# Patient Record
Sex: Male | Born: 1945 | Race: White | Hispanic: No | Marital: Married | Smoking: Former smoker
Health system: Southern US, Community
[De-identification: ages and names within clinical notes are randomized; demographics above are authoritative.]

## PROBLEM LIST (undated history)

## (undated) DIAGNOSIS — K8020 Calculus of gallbladder without cholecystitis without obstruction: Secondary | ICD-10-CM

## (undated) DIAGNOSIS — E875 Hyperkalemia: Secondary | ICD-10-CM

## (undated) DIAGNOSIS — G4733 Obstructive sleep apnea (adult) (pediatric): Secondary | ICD-10-CM

## (undated) DIAGNOSIS — E119 Type 2 diabetes mellitus without complications: Secondary | ICD-10-CM

## (undated) DIAGNOSIS — D61810 Antineoplastic chemotherapy induced pancytopenia: Secondary | ICD-10-CM

## (undated) DIAGNOSIS — I5032 Chronic diastolic (congestive) heart failure: Secondary | ICD-10-CM

## (undated) DIAGNOSIS — E785 Hyperlipidemia, unspecified: Secondary | ICD-10-CM

## (undated) DIAGNOSIS — L309 Dermatitis, unspecified: Secondary | ICD-10-CM

## (undated) DIAGNOSIS — L299 Pruritus, unspecified: Secondary | ICD-10-CM

## (undated) DIAGNOSIS — Z9981 Dependence on supplemental oxygen: Secondary | ICD-10-CM

## (undated) DIAGNOSIS — E662 Morbid (severe) obesity with alveolar hypoventilation: Secondary | ICD-10-CM

## (undated) DIAGNOSIS — J90 Pleural effusion, not elsewhere classified: Secondary | ICD-10-CM

## (undated) DIAGNOSIS — H5340 Unspecified visual field defects: Secondary | ICD-10-CM

## (undated) DIAGNOSIS — S68117A Complete traumatic metacarpophalangeal amputation of left little finger, initial encounter: Secondary | ICD-10-CM

## (undated) DIAGNOSIS — K922 Gastrointestinal hemorrhage, unspecified: Secondary | ICD-10-CM

## (undated) DIAGNOSIS — B029 Zoster without complications: Secondary | ICD-10-CM

## (undated) DIAGNOSIS — N183 Chronic kidney disease, stage 3 (moderate): Secondary | ICD-10-CM

## (undated) DIAGNOSIS — R0902 Hypoxemia: Secondary | ICD-10-CM

## (undated) DIAGNOSIS — I2510 Atherosclerotic heart disease of native coronary artery without angina pectoris: Secondary | ICD-10-CM

## (undated) DIAGNOSIS — I219 Acute myocardial infarction, unspecified: Secondary | ICD-10-CM

## (undated) DIAGNOSIS — M1990 Unspecified osteoarthritis, unspecified site: Secondary | ICD-10-CM

## (undated) DIAGNOSIS — Z87442 Personal history of urinary calculi: Secondary | ICD-10-CM

## (undated) DIAGNOSIS — J449 Chronic obstructive pulmonary disease, unspecified: Secondary | ICD-10-CM

## (undated) DIAGNOSIS — I10 Essential (primary) hypertension: Secondary | ICD-10-CM

## (undated) DIAGNOSIS — I5042 Chronic combined systolic (congestive) and diastolic (congestive) heart failure: Secondary | ICD-10-CM

## (undated) DIAGNOSIS — I4510 Unspecified right bundle-branch block: Secondary | ICD-10-CM

## (undated) DIAGNOSIS — H9113 Presbycusis, bilateral: Secondary | ICD-10-CM

## (undated) DIAGNOSIS — K7460 Unspecified cirrhosis of liver: Secondary | ICD-10-CM

## (undated) DIAGNOSIS — C159 Malignant neoplasm of esophagus, unspecified: Secondary | ICD-10-CM

## (undated) HISTORY — DX: Atherosclerotic heart disease of native coronary artery without angina pectoris: I25.10

## (undated) HISTORY — DX: Type 2 diabetes mellitus without complications: E11.9

## (undated) HISTORY — DX: Unspecified visual field defects: H53.40

## (undated) HISTORY — PX: APPENDECTOMY: SHX54

## (undated) HISTORY — DX: Pleural effusion, not elsewhere classified: J90

## (undated) HISTORY — PX: CARPAL TUNNEL RELEASE: SHX101

## (undated) HISTORY — DX: Antineoplastic chemotherapy induced pancytopenia: D61.810

## (undated) HISTORY — DX: Pruritus, unspecified: L29.9

## (undated) HISTORY — DX: Morbid (severe) obesity with alveolar hypoventilation: E66.2

## (undated) HISTORY — DX: Hyperkalemia: E87.5

## (undated) HISTORY — PX: SHOULDER SURGERY: SHX246

## (undated) HISTORY — DX: Obstructive sleep apnea (adult) (pediatric): G47.33

## (undated) HISTORY — DX: Essential (primary) hypertension: I10

## (undated) HISTORY — DX: Unspecified cirrhosis of liver: K74.60

## (undated) HISTORY — PX: TRANSTHORACIC ECHOCARDIOGRAM: SHX275

## (undated) HISTORY — PX: CARDIAC CATHETERIZATION: SHX172

## (undated) HISTORY — DX: Presbycusis, bilateral: H91.13

## (undated) HISTORY — DX: Unspecified osteoarthritis, unspecified site: M19.90

## (undated) HISTORY — DX: Zoster without complications: B02.9

## (undated) HISTORY — DX: Hypoxemia: R09.02

## (undated) HISTORY — DX: Unspecified right bundle-branch block: I45.10

## (undated) HISTORY — DX: Morbid (severe) obesity due to excess calories: E66.01

## (undated) HISTORY — PX: TONSILLECTOMY: SUR1361

## (undated) HISTORY — PX: WRIST SURGERY: SHX841

## (undated) HISTORY — DX: Chronic kidney disease, stage 3 (moderate): N18.3

## (undated) HISTORY — DX: Hyperlipidemia, unspecified: E78.5

## (undated) HISTORY — DX: Chronic diastolic (congestive) heart failure: I50.32

## (undated) HISTORY — DX: Calculus of gallbladder without cholecystitis without obstruction: K80.20

## (undated) HISTORY — PX: CATARACT EXTRACTION, BILATERAL: SHX1313

## (undated) HISTORY — DX: Gastrointestinal hemorrhage, unspecified: K92.2

## (undated) HISTORY — DX: Chronic obstructive pulmonary disease, unspecified: J44.9

## (undated) HISTORY — DX: Dermatitis, unspecified: L30.9

## (undated) SURGERY — LEFT HEART CATHETERIZATION WITH CORONARY ANGIOGRAM
Anesthesia: Moderate Sedation

---
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
## 1998-03-08 ENCOUNTER — Encounter: Admission: RE | Admit: 1998-03-08 | Discharge: 1998-06-06 | Payer: Self-pay | Admitting: Family Medicine

## 1999-10-30 HISTORY — PX: LUMBAR DISC SURGERY: SHX700

## 1999-10-30 LAB — HM COLONOSCOPY: HM Colonoscopy: NORMAL

## 2000-01-15 ENCOUNTER — Encounter: Admission: RE | Admit: 2000-01-15 | Discharge: 2000-01-24 | Payer: Self-pay | Admitting: Occupational Medicine

## 2000-03-27 ENCOUNTER — Ambulatory Visit (HOSPITAL_BASED_OUTPATIENT_CLINIC_OR_DEPARTMENT_OTHER): Admission: RE | Admit: 2000-03-27 | Discharge: 2000-03-27 | Payer: Self-pay | Admitting: *Deleted

## 2000-06-21 ENCOUNTER — Encounter: Payer: Self-pay | Admitting: Neurosurgery

## 2000-06-26 ENCOUNTER — Encounter: Payer: Self-pay | Admitting: Neurosurgery

## 2000-06-26 ENCOUNTER — Ambulatory Visit (HOSPITAL_COMMUNITY): Admission: RE | Admit: 2000-06-26 | Discharge: 2000-06-26 | Payer: Self-pay | Admitting: Neurosurgery

## 2000-06-29 ENCOUNTER — Emergency Department (HOSPITAL_COMMUNITY): Admission: EM | Admit: 2000-06-29 | Discharge: 2000-06-29 | Payer: Self-pay | Admitting: Emergency Medicine

## 2003-02-17 ENCOUNTER — Encounter: Admission: RE | Admit: 2003-02-17 | Discharge: 2003-05-18 | Payer: Self-pay | Admitting: Family Medicine

## 2003-09-14 ENCOUNTER — Encounter: Payer: Self-pay | Admitting: Internal Medicine

## 2003-09-20 ENCOUNTER — Encounter: Payer: Self-pay | Admitting: Internal Medicine

## 2003-10-06 ENCOUNTER — Encounter: Admission: RE | Admit: 2003-10-06 | Discharge: 2003-10-06 | Payer: Self-pay | Admitting: Urology

## 2003-10-07 ENCOUNTER — Observation Stay (HOSPITAL_COMMUNITY): Admission: EM | Admit: 2003-10-07 | Discharge: 2003-10-08 | Payer: Self-pay | Admitting: Urology

## 2003-10-18 ENCOUNTER — Encounter: Payer: Self-pay | Admitting: Internal Medicine

## 2004-09-05 ENCOUNTER — Encounter: Admission: RE | Admit: 2004-09-05 | Discharge: 2004-12-04 | Payer: Self-pay | Admitting: Internal Medicine

## 2004-11-13 ENCOUNTER — Ambulatory Visit: Payer: Self-pay | Admitting: Internal Medicine

## 2004-12-12 ENCOUNTER — Encounter: Admission: RE | Admit: 2004-12-12 | Discharge: 2005-03-12 | Payer: Self-pay | Admitting: Internal Medicine

## 2005-02-12 ENCOUNTER — Ambulatory Visit: Payer: Self-pay | Admitting: Internal Medicine

## 2005-04-11 ENCOUNTER — Ambulatory Visit: Payer: Self-pay | Admitting: Internal Medicine

## 2005-04-18 ENCOUNTER — Ambulatory Visit: Payer: Self-pay | Admitting: Internal Medicine

## 2005-05-22 ENCOUNTER — Ambulatory Visit: Payer: Self-pay | Admitting: Internal Medicine

## 2005-09-05 ENCOUNTER — Ambulatory Visit: Payer: Self-pay | Admitting: Internal Medicine

## 2005-10-11 ENCOUNTER — Ambulatory Visit: Payer: Self-pay | Admitting: Internal Medicine

## 2005-10-16 ENCOUNTER — Ambulatory Visit: Payer: Self-pay | Admitting: Internal Medicine

## 2005-10-24 ENCOUNTER — Ambulatory Visit: Payer: Self-pay | Admitting: Internal Medicine

## 2005-11-20 ENCOUNTER — Ambulatory Visit: Payer: Self-pay | Admitting: Internal Medicine

## 2006-01-04 ENCOUNTER — Ambulatory Visit: Payer: Self-pay | Admitting: Internal Medicine

## 2006-01-11 ENCOUNTER — Ambulatory Visit: Payer: Self-pay | Admitting: Internal Medicine

## 2006-04-24 ENCOUNTER — Ambulatory Visit: Payer: Self-pay | Admitting: Internal Medicine

## 2006-05-14 ENCOUNTER — Ambulatory Visit: Payer: Self-pay | Admitting: Internal Medicine

## 2006-05-20 ENCOUNTER — Ambulatory Visit: Payer: Self-pay | Admitting: Family Medicine

## 2006-06-25 ENCOUNTER — Ambulatory Visit: Payer: Self-pay | Admitting: Internal Medicine

## 2006-08-02 ENCOUNTER — Ambulatory Visit: Payer: Self-pay | Admitting: Internal Medicine

## 2006-09-06 ENCOUNTER — Ambulatory Visit: Payer: Self-pay | Admitting: Internal Medicine

## 2006-09-06 LAB — CONVERTED CEMR LAB
ALT: 45 units/L — ABNORMAL HIGH (ref 0–40)
AST: 38 units/L — ABNORMAL HIGH (ref 0–37)
Albumin: 3.8 g/dL (ref 3.5–5.2)
Alkaline Phosphatase: 53 units/L (ref 39–117)
BUN: 16 mg/dL (ref 6–23)
Bilirubin, Direct: 0.1 mg/dL (ref 0.0–0.3)
CO2: 34 meq/L — ABNORMAL HIGH (ref 19–32)
Calcium: 9.8 mg/dL (ref 8.4–10.5)
Chloride: 101 meq/L (ref 96–112)
Chol/HDL Ratio, serum: 4.8
Cholesterol: 167 mg/dL (ref 0–200)
Creatinine, Ser: 0.9 mg/dL (ref 0.4–1.5)
GFR calc non Af Amer: 91 mL/min
Glomerular Filtration Rate, Af Am: 110 mL/min/{1.73_m2}
Glucose, Bld: 100 mg/dL — ABNORMAL HIGH (ref 70–99)
HDL: 34.9 mg/dL — ABNORMAL LOW (ref >39.0)
Hgb A1c MFr Bld: 6.3 % — ABNORMAL HIGH (ref 4.6–6.0)
LDL DIRECT: 96.1 mg/dL
Potassium: 5.2 meq/L — ABNORMAL HIGH (ref 3.5–5.1)
Sodium: 140 meq/L (ref 135–145)
Total Bilirubin: 0.9 mg/dL (ref 0.3–1.2)
Total Protein: 7.2 g/dL (ref 6.0–8.3)
Triglyceride fasting, serum: 268 mg/dL (ref 0–149)
VLDL: 54 mg/dL — ABNORMAL HIGH (ref 0–40)

## 2006-10-16 ENCOUNTER — Ambulatory Visit: Payer: Self-pay | Admitting: Internal Medicine

## 2007-02-07 ENCOUNTER — Ambulatory Visit: Payer: Self-pay | Admitting: Internal Medicine

## 2007-02-07 LAB — CONVERTED CEMR LAB
AST: 40 units/L — ABNORMAL HIGH (ref 0–37)
Albumin: 3.6 g/dL (ref 3.5–5.2)
Alkaline Phosphatase: 57 units/L (ref 39–117)
BUN: 16 mg/dL (ref 6–23)
CO2: 35 meq/L — ABNORMAL HIGH (ref 19–32)
Chloride: 104 meq/L (ref 96–112)
Cholesterol: 174 mg/dL (ref 0–200)
Creatinine, Ser: 0.9 mg/dL (ref 0.4–1.5)
HDL: 38.4 mg/dL — ABNORMAL LOW (ref >39.0)
Sodium: 147 meq/L — ABNORMAL HIGH (ref 135–145)
Total Bilirubin: 1 mg/dL (ref 0.3–1.2)
Total Protein: 7.1 g/dL (ref 6.0–8.3)
VLDL: 54 mg/dL — ABNORMAL HIGH (ref 0–40)

## 2007-02-14 ENCOUNTER — Ambulatory Visit: Payer: Self-pay | Admitting: Internal Medicine

## 2007-05-01 DIAGNOSIS — I10 Essential (primary) hypertension: Secondary | ICD-10-CM

## 2007-05-01 DIAGNOSIS — M1990 Unspecified osteoarthritis, unspecified site: Secondary | ICD-10-CM

## 2007-05-01 HISTORY — DX: Unspecified osteoarthritis, unspecified site: M19.90

## 2007-05-01 HISTORY — DX: Essential (primary) hypertension: I10

## 2007-06-11 ENCOUNTER — Telehealth (INDEPENDENT_AMBULATORY_CARE_PROVIDER_SITE_OTHER): Payer: Self-pay | Admitting: *Deleted

## 2007-06-17 ENCOUNTER — Ambulatory Visit: Payer: Self-pay | Admitting: Internal Medicine

## 2007-06-17 LAB — CONVERTED CEMR LAB
ALT: 42 units/L (ref 0–53)
AST: 33 units/L (ref 0–37)
Albumin: 3.9 g/dL (ref 3.5–5.2)
Alkaline Phosphatase: 54 units/L (ref 39–117)
BUN: 13 mg/dL (ref 6–23)
Bilirubin, Direct: 0.1 mg/dL (ref 0.0–0.3)
CO2: 35 meq/L — ABNORMAL HIGH (ref 19–32)
Calcium: 9 mg/dL (ref 8.4–10.5)
Chloride: 104 meq/L (ref 96–112)
Cholesterol: 158 mg/dL (ref 0–200)
Creatinine, Ser: 0.7 mg/dL (ref 0.4–1.5)
Direct LDL: 90.8 mg/dL
GFR calc Af Amer: 147 mL/min
GFR calc non Af Amer: 122 mL/min
Glucose, Bld: 113 mg/dL — ABNORMAL HIGH (ref 70–99)
HDL: 36.1 mg/dL — ABNORMAL LOW (ref >39.0)
Hgb A1c MFr Bld: 6.1 % — ABNORMAL HIGH (ref 4.6–6.0)
Potassium: 4.1 meq/L (ref 3.5–5.1)
Sodium: 144 meq/L (ref 135–145)
Total Bilirubin: 0.9 mg/dL (ref 0.3–1.2)
Total CHOL/HDL Ratio: 4.4
Total Protein: 7 g/dL (ref 6.0–8.3)
Triglycerides: 208 mg/dL (ref 0–149)
VLDL: 42 mg/dL — ABNORMAL HIGH (ref 0–40)

## 2007-06-24 ENCOUNTER — Ambulatory Visit: Payer: Self-pay | Admitting: Internal Medicine

## 2007-06-24 DIAGNOSIS — E785 Hyperlipidemia, unspecified: Secondary | ICD-10-CM

## 2007-06-24 DIAGNOSIS — E119 Type 2 diabetes mellitus without complications: Secondary | ICD-10-CM | POA: Insufficient documentation

## 2007-06-24 HISTORY — DX: Type 2 diabetes mellitus without complications: E11.9

## 2007-06-24 HISTORY — DX: Hyperlipidemia, unspecified: E78.5

## 2007-06-24 LAB — CONVERTED CEMR LAB
Cholesterol, target level: 200 mg/dL
HDL goal, serum: 40 mg/dL
LDL Goal: 100 mg/dL

## 2007-07-03 ENCOUNTER — Telehealth (INDEPENDENT_AMBULATORY_CARE_PROVIDER_SITE_OTHER): Payer: Self-pay | Admitting: *Deleted

## 2007-08-05 ENCOUNTER — Telehealth: Payer: Self-pay | Admitting: Internal Medicine

## 2007-08-12 ENCOUNTER — Ambulatory Visit: Payer: Self-pay | Admitting: Internal Medicine

## 2007-11-03 ENCOUNTER — Telehealth: Payer: Self-pay | Admitting: Internal Medicine

## 2007-11-04 ENCOUNTER — Ambulatory Visit: Payer: Self-pay | Admitting: Internal Medicine

## 2007-11-04 LAB — CONVERTED CEMR LAB
Alkaline Phosphatase: 64 units/L (ref 39–117)
BUN: 15 mg/dL (ref 6–23)
CO2: 34 meq/L — ABNORMAL HIGH (ref 19–32)
Cholesterol: 162 mg/dL (ref 0–200)
Creatinine, Ser: 0.9 mg/dL (ref 0.4–1.5)
Glucose, Bld: 113 mg/dL — ABNORMAL HIGH (ref 70–99)
HDL: 28 mg/dL — ABNORMAL LOW (ref >39.0)
Microalb, Ur: 12.7 mg/dL — ABNORMAL HIGH (ref 0.0–1.9)
Potassium: 5.4 meq/L — ABNORMAL HIGH (ref 3.5–5.1)
Sodium: 139 meq/L (ref 135–145)
Total Bilirubin: 1.2 mg/dL (ref 0.3–1.2)
Total Protein: 6.7 g/dL (ref 6.0–8.3)

## 2007-11-11 ENCOUNTER — Ambulatory Visit: Payer: Self-pay | Admitting: Internal Medicine

## 2007-11-17 ENCOUNTER — Ambulatory Visit: Payer: Self-pay | Admitting: Internal Medicine

## 2007-11-19 ENCOUNTER — Telehealth: Payer: Self-pay | Admitting: Internal Medicine

## 2007-11-28 ENCOUNTER — Ambulatory Visit: Payer: Self-pay | Admitting: Internal Medicine

## 2008-02-18 ENCOUNTER — Ambulatory Visit: Payer: Self-pay | Admitting: Internal Medicine

## 2008-02-18 LAB — CONVERTED CEMR LAB
Albumin: 3.5 g/dL (ref 3.5–5.2)
BUN: 18 mg/dL (ref 6–23)
Cholesterol: 113 mg/dL (ref 0–200)
Creatinine, Ser: 0.9 mg/dL (ref 0.4–1.5)
GFR calc Af Amer: 110 mL/min
GFR calc non Af Amer: 91 mL/min
Hgb A1c MFr Bld: 6.5 % — ABNORMAL HIGH (ref 4.6–6.0)
LDL Cholesterol: 64 mg/dL (ref 0–99)
Potassium: 4.4 meq/L (ref 3.5–5.1)
Triglycerides: 103 mg/dL (ref 0–149)
VLDL: 21 mg/dL (ref 0–40)

## 2008-02-25 ENCOUNTER — Ambulatory Visit: Payer: Self-pay | Admitting: Internal Medicine

## 2008-02-27 ENCOUNTER — Telehealth (INDEPENDENT_AMBULATORY_CARE_PROVIDER_SITE_OTHER): Payer: Self-pay | Admitting: *Deleted

## 2008-02-27 ENCOUNTER — Telehealth: Payer: Self-pay | Admitting: Internal Medicine

## 2008-02-27 LAB — CONVERTED CEMR LAB
Basophils Relative: 0.9 % (ref 0.0–1.0)
Hemoglobin: 14 g/dL (ref 13.0–17.0)
Lymphocytes Relative: 12.9 % (ref 12.0–46.0)
Monocytes Relative: 12 % (ref 3.0–12.0)
Neutro Abs: 4.7 10*3/uL (ref 1.4–7.7)
RBC: 4.74 M/uL (ref 4.22–5.81)

## 2008-03-03 ENCOUNTER — Telehealth: Payer: Self-pay | Admitting: Internal Medicine

## 2008-05-14 ENCOUNTER — Ambulatory Visit: Payer: Self-pay | Admitting: Internal Medicine

## 2008-05-14 LAB — CONVERTED CEMR LAB
Hgb A1c MFr Bld: 6.2 % — ABNORMAL HIGH (ref 4.6–6.0)
LDL Cholesterol: 83 mg/dL (ref 0–99)
VLDL: 37 mg/dL (ref 0–40)

## 2008-05-25 ENCOUNTER — Ambulatory Visit: Payer: Self-pay | Admitting: Internal Medicine

## 2008-06-01 ENCOUNTER — Ambulatory Visit: Payer: Self-pay | Admitting: Internal Medicine

## 2008-06-01 LAB — CONVERTED CEMR LAB
Calcium: 9.4 mg/dL (ref 8.4–10.5)
GFR calc Af Amer: 110 mL/min
Glucose, Bld: 174 mg/dL — ABNORMAL HIGH (ref 70–99)
Sodium: 142 meq/L (ref 135–145)

## 2008-06-28 ENCOUNTER — Telehealth: Payer: Self-pay | Admitting: Internal Medicine

## 2008-07-01 ENCOUNTER — Ambulatory Visit: Payer: Self-pay | Admitting: Internal Medicine

## 2008-07-12 ENCOUNTER — Telehealth: Payer: Self-pay | Admitting: Internal Medicine

## 2008-09-02 ENCOUNTER — Ambulatory Visit: Payer: Self-pay | Admitting: Internal Medicine

## 2008-09-03 LAB — CONVERTED CEMR LAB
ALT: 42 units/L (ref 0–53)
AST: 35 units/L (ref 0–37)
CO2: 33 meq/L — ABNORMAL HIGH (ref 19–32)
Calcium: 9.5 mg/dL (ref 8.4–10.5)
Chloride: 101 meq/L (ref 96–112)
Cholesterol: 186 mg/dL (ref 0–200)
Direct LDL: 98.2 mg/dL
Glucose, Bld: 138 mg/dL — ABNORMAL HIGH (ref 70–99)
Sodium: 142 meq/L (ref 135–145)
Triglycerides: 278 mg/dL (ref 0–149)

## 2008-10-26 ENCOUNTER — Encounter: Payer: Self-pay | Admitting: Internal Medicine

## 2008-10-26 ENCOUNTER — Ambulatory Visit: Payer: Self-pay | Admitting: Family Medicine

## 2008-11-01 ENCOUNTER — Ambulatory Visit: Payer: Self-pay | Admitting: Internal Medicine

## 2008-11-01 ENCOUNTER — Telehealth: Payer: Self-pay | Admitting: Internal Medicine

## 2008-11-05 ENCOUNTER — Ambulatory Visit: Payer: Self-pay | Admitting: Internal Medicine

## 2008-11-11 ENCOUNTER — Ambulatory Visit: Payer: Self-pay | Admitting: Internal Medicine

## 2008-12-07 ENCOUNTER — Ambulatory Visit: Payer: Self-pay | Admitting: Internal Medicine

## 2008-12-07 ENCOUNTER — Inpatient Hospital Stay (HOSPITAL_COMMUNITY): Admission: EM | Admit: 2008-12-07 | Discharge: 2008-12-08 | Payer: Self-pay | Admitting: Family Medicine

## 2008-12-07 ENCOUNTER — Telehealth: Payer: Self-pay | Admitting: *Deleted

## 2008-12-13 ENCOUNTER — Telehealth: Payer: Self-pay | Admitting: Internal Medicine

## 2008-12-17 ENCOUNTER — Ambulatory Visit: Payer: Self-pay | Admitting: Internal Medicine

## 2008-12-20 ENCOUNTER — Ambulatory Visit: Payer: Self-pay | Admitting: Internal Medicine

## 2008-12-24 ENCOUNTER — Telehealth: Payer: Self-pay | Admitting: Internal Medicine

## 2008-12-30 ENCOUNTER — Telehealth: Payer: Self-pay | Admitting: Internal Medicine

## 2009-01-05 ENCOUNTER — Encounter: Payer: Self-pay | Admitting: Internal Medicine

## 2009-01-14 ENCOUNTER — Ambulatory Visit: Payer: Self-pay | Admitting: Internal Medicine

## 2009-01-31 ENCOUNTER — Telehealth: Payer: Self-pay | Admitting: Internal Medicine

## 2009-02-25 ENCOUNTER — Telehealth: Payer: Self-pay | Admitting: Internal Medicine

## 2009-02-25 ENCOUNTER — Ambulatory Visit: Payer: Self-pay | Admitting: Internal Medicine

## 2009-02-25 DIAGNOSIS — I5042 Chronic combined systolic (congestive) and diastolic (congestive) heart failure: Secondary | ICD-10-CM

## 2009-02-25 DIAGNOSIS — I5032 Chronic diastolic (congestive) heart failure: Secondary | ICD-10-CM

## 2009-02-25 HISTORY — DX: Chronic combined systolic (congestive) and diastolic (congestive) heart failure: I50.42

## 2009-02-28 LAB — CONVERTED CEMR LAB
CO2: 31 meq/L (ref 19–32)
Calcium: 9.3 mg/dL (ref 8.4–10.5)
Cholesterol: 144 mg/dL (ref 0–200)
GFR calc non Af Amer: 64.85 mL/min (ref >60)
HDL: 32 mg/dL — ABNORMAL LOW (ref >39.00)
Hgb A1c MFr Bld: 7 % — ABNORMAL HIGH (ref 4.6–6.5)
Sodium: 141 meq/L (ref 135–145)
Triglycerides: 285 mg/dL — ABNORMAL HIGH (ref 0.0–149.0)

## 2009-05-03 ENCOUNTER — Telehealth: Payer: Self-pay | Admitting: Internal Medicine

## 2009-07-01 ENCOUNTER — Ambulatory Visit: Payer: Self-pay | Admitting: Internal Medicine

## 2009-07-01 ENCOUNTER — Telehealth: Payer: Self-pay | Admitting: Internal Medicine

## 2009-07-07 ENCOUNTER — Encounter: Payer: Self-pay | Admitting: Internal Medicine

## 2009-07-07 ENCOUNTER — Telehealth: Payer: Self-pay | Admitting: Internal Medicine

## 2009-07-11 LAB — CONVERTED CEMR LAB
BUN: 16 mg/dL (ref 6–23)
CO2: 34 meq/L — ABNORMAL HIGH (ref 19–32)
Chloride: 100 meq/L (ref 96–112)
Direct LDL: 72.4 mg/dL
Glucose, Bld: 143 mg/dL — ABNORMAL HIGH (ref 70–99)
HDL: 37.2 mg/dL — ABNORMAL LOW (ref >39.00)
Potassium: 5.7 meq/L — ABNORMAL HIGH (ref 3.5–5.1)
Sodium: 142 meq/L (ref 135–145)
Total CHOL/HDL Ratio: 4
VLDL: 61.8 mg/dL — ABNORMAL HIGH (ref 0.0–40.0)

## 2009-07-14 ENCOUNTER — Telehealth: Payer: Self-pay | Admitting: Internal Medicine

## 2009-07-27 ENCOUNTER — Ambulatory Visit: Payer: Self-pay | Admitting: Internal Medicine

## 2009-08-04 LAB — CONVERTED CEMR LAB
Calcium: 9.4 mg/dL (ref 8.4–10.5)
Creatinine, Ser: 1 mg/dL (ref 0.4–1.5)
GFR calc non Af Amer: 79.93 mL/min (ref >60)
Sodium: 138 meq/L (ref 135–145)

## 2009-08-19 ENCOUNTER — Ambulatory Visit: Payer: Self-pay | Admitting: Internal Medicine

## 2009-08-26 ENCOUNTER — Telehealth: Payer: Self-pay | Admitting: Internal Medicine

## 2009-10-03 ENCOUNTER — Telehealth: Payer: Self-pay | Admitting: Internal Medicine

## 2009-11-04 ENCOUNTER — Ambulatory Visit: Payer: Self-pay | Admitting: Internal Medicine

## 2009-11-07 ENCOUNTER — Ambulatory Visit: Payer: Self-pay | Admitting: Family Medicine

## 2009-11-07 ENCOUNTER — Telehealth: Payer: Self-pay | Admitting: Internal Medicine

## 2009-11-07 LAB — CONVERTED CEMR LAB
BUN: 15 mg/dL (ref 6–23)
Calcium: 9.6 mg/dL (ref 8.4–10.5)
Cholesterol: 185 mg/dL (ref 0–200)
Direct LDL: 94.6 mg/dL
GFR calc non Af Amer: 90.19 mL/min (ref >60)
Glucose, Bld: 139 mg/dL — ABNORMAL HIGH (ref 70–99)
HDL: 36.1 mg/dL — ABNORMAL LOW (ref >39.00)
Potassium: 4.4 meq/L (ref 3.5–5.1)
Sodium: 139 meq/L (ref 135–145)
VLDL: 94.6 mg/dL — ABNORMAL HIGH (ref 0.0–40.0)

## 2009-11-09 ENCOUNTER — Ambulatory Visit: Payer: Self-pay | Admitting: Family Medicine

## 2010-01-27 ENCOUNTER — Ambulatory Visit: Payer: Self-pay | Admitting: Internal Medicine

## 2010-01-27 DIAGNOSIS — R0902 Hypoxemia: Secondary | ICD-10-CM

## 2010-01-30 LAB — CONVERTED CEMR LAB
Bilirubin, Direct: 0.1 mg/dL (ref 0.0–0.3)
CO2: 29 meq/L (ref 19–32)
Calcium: 9.8 mg/dL (ref 8.4–10.5)
Chloride: 98 meq/L (ref 96–112)
Direct LDL: 105 mg/dL
Glucose, Bld: 115 mg/dL — ABNORMAL HIGH (ref 70–99)
HDL: 39.3 mg/dL (ref >39.00)
Hgb A1c MFr Bld: 7.3 % — ABNORMAL HIGH (ref 4.6–6.5)
Sodium: 133 meq/L — ABNORMAL LOW (ref 135–145)
TSH: 3.75 microintl units/mL (ref 0.35–5.50)
Total Bilirubin: 0.6 mg/dL (ref 0.3–1.2)
Total CHOL/HDL Ratio: 5
Triglycerides: 360 mg/dL — ABNORMAL HIGH (ref 0.0–149.0)

## 2010-02-02 ENCOUNTER — Encounter: Payer: Self-pay | Admitting: Internal Medicine

## 2010-02-06 ENCOUNTER — Encounter: Payer: Self-pay | Admitting: Internal Medicine

## 2010-02-13 ENCOUNTER — Telehealth: Payer: Self-pay | Admitting: Internal Medicine

## 2010-02-15 ENCOUNTER — Telehealth: Payer: Self-pay | Admitting: Internal Medicine

## 2010-03-23 ENCOUNTER — Encounter: Payer: Self-pay | Admitting: Internal Medicine

## 2010-05-02 ENCOUNTER — Ambulatory Visit: Payer: Self-pay | Admitting: Internal Medicine

## 2010-05-05 LAB — CONVERTED CEMR LAB
ALT: 88 units/L — ABNORMAL HIGH (ref 0–53)
Alkaline Phosphatase: 49 units/L (ref 39–117)
Basophils Relative: 0.8 % (ref 0.0–3.0)
Bilirubin, Direct: 0.1 mg/dL (ref 0.0–0.3)
CO2: 32 meq/L (ref 19–32)
Calcium: 9.3 mg/dL (ref 8.4–10.5)
Direct LDL: 105.6 mg/dL
Eosinophils Absolute: 0.5 10*3/uL (ref 0.0–0.7)
Eosinophils Relative: 6.4 % — ABNORMAL HIGH (ref 0.0–5.0)
GFR calc non Af Amer: 96.19 mL/min (ref >60)
Hemoglobin: 14.3 g/dL (ref 13.0–17.0)
Lymphocytes Relative: 23.1 % (ref 12.0–46.0)
MCHC: 34 g/dL (ref 30.0–36.0)
Monocytes Relative: 10.4 % (ref 3.0–12.0)
Neutro Abs: 4.7 10*3/uL (ref 1.4–7.7)
Neutrophils Relative %: 59.3 % (ref 43.0–77.0)
Potassium: 4.8 meq/L (ref 3.5–5.1)
RBC: 4.72 M/uL (ref 4.22–5.81)
Sodium: 138 meq/L (ref 135–145)
Total Bilirubin: 0.6 mg/dL (ref 0.3–1.2)
Total CHOL/HDL Ratio: 6
Triglycerides: 587 mg/dL — ABNORMAL HIGH (ref 0.0–149.0)
WBC: 7.9 10*3/uL (ref 4.5–10.5)

## 2010-06-05 ENCOUNTER — Telehealth: Payer: Self-pay | Admitting: Internal Medicine

## 2010-07-29 LAB — HM DIABETES EYE EXAM: HM Diabetic Eye Exam: NORMAL

## 2010-08-30 ENCOUNTER — Ambulatory Visit: Payer: Self-pay | Admitting: Internal Medicine

## 2010-08-30 LAB — HM DIABETES FOOT EXAM

## 2010-09-01 LAB — CONVERTED CEMR LAB
ALT: 63 units/L — ABNORMAL HIGH (ref 0–53)
CO2: 32 meq/L (ref 19–32)
Calcium: 9.5 mg/dL (ref 8.4–10.5)
Direct LDL: 81.1 mg/dL
Eosinophils Relative: 4.1 % (ref 0.0–5.0)
GFR calc non Af Amer: 81.54 mL/min (ref >60)
HDL: 35.6 mg/dL — ABNORMAL LOW (ref >39.00)
Hgb A1c MFr Bld: 6.9 % — ABNORMAL HIGH (ref 4.6–6.5)
Lymphocytes Relative: 26.9 % (ref 12.0–46.0)
Monocytes Relative: 10.4 % (ref 3.0–12.0)
Neutrophils Relative %: 57.8 % (ref 43.0–77.0)
Platelets: 185 10*3/uL (ref 150.0–400.0)
Potassium: 4.7 meq/L (ref 3.5–5.1)
Sodium: 138 meq/L (ref 135–145)
TSH: 3.04 microintl units/mL (ref 0.35–5.50)
Total Protein: 6.8 g/dL (ref 6.0–8.3)
Triglycerides: 419 mg/dL — ABNORMAL HIGH (ref 0.0–149.0)
VLDL: 83.8 mg/dL — ABNORMAL HIGH (ref 0.0–40.0)
WBC: 6.8 10*3/uL (ref 4.5–10.5)

## 2010-11-28 NOTE — Letter (Signed)
Summary: Medical Necessity for Oxygen/[facility]  Medical Necessity for Oxygen/[facility]   Imported By: Maryln Gottron 02/03/2010 15:23:53  _____________________________________________________________________  External Attachment:    Type:   Image     Comment:   External Document

## 2010-11-28 NOTE — Assessment & Plan Note (Signed)
Summary: 4 month rov/njr   Vital Signs:  Patient profile:   65 year old male Weight:      324 pounds O2 Sat:      95 % on 2 L/min Temp:     98.7 degrees F oral Pulse rate:   115 / minute Pulse rhythm:   regular BP sitting:   122 / 64  (left arm) Cuff size:   large  Vitals Entered By: Alfred Levins, CMA (August 30, 2010 10:33 AM)  O2 Flow:  2 L/min CC: 3 mth f/u   CC:  3 mth f/u.  History of Present Illness:  Follow-Up Visit      This is a 65 year old man who presents for Follow-up visit.  The patient denies chest pain and palpitations.  Since the last visit the patient notes no new problems or concerns.  The patient reports taking meds as prescribed, not monitoring BP, monitoring blood sugars, dietary noncompliance, and not exercising.  When questioned about possible medication side effects, the patient notes none.    All other systems reviewed and were negative he is trying to eat of of a smaller plate he is on chronic home O2.   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 Take 1 Capsule By Mouth Once A Day 2)  Relion Insulin Syringe 29g X 1/2" 1 Ml Misc (Insulin Syringe-Needle U-100) .... Use Three Times A Day -Qid As Directed 3)  Simvastatin 80 Mg Tabs (Simvastatin) .Marland Kitchen.. 1 By Mouth At Bedtime 4)  Lisinopril 40 Mg Tabs (Lisinopril) .... Take 1 Tablet By Mouth Once A Day 5)  Metformin Hcl 1000 Mg Tabs (Metformin Hcl) .... Take 1 Tablet By Mouth Twice A Day 6)  Adult Aspirin Low Strength 81 Mg  Tbdp (Aspirin) .... One By Mouth Daily 7)  Centrum Silver   Tabs (Multiple Vitamins-Minerals) .... Once Daily 8)  Hydrocodone-Acetaminophen 5-500 Mg  Tabs  (Hydrocodone-Acetaminophen) .... Take 1 Tablet By Mouth Two Times A Day As Needed 9)  Demadex 20 Mg  Tabs (Torsemide) .... Take 1 Tablet By Mouth Once A Day 10)  Humulin N 100 Unit/ml Susp (Insulin Isophane Human) .... 70 Units Subcutaneously Two Times A Day 11)  Clotrimazole-Betamethasone 1-0.05 % Crea (Clotrimazole-Betamethasone) .... Apply Small Amount To Area Under Arms and Between Legs Two Times A Day As Needed 12)  Humulin R 100 Unit/ml Soln (Insulin Regular Human) .... Inject 32 Units Subcutaneously Three Times A Day With Meals  Allergies (verified): No Known Drug Allergies  Past History:  Past Medical History: Last updated: 06/24/2007 Hypertension Osteoarthritis Diabetes mellitus, type II Hyperlipidemia OSA  Past Surgical History: Last updated: 05/01/2007 Appendectomy Back sx Rt shoulder sx-1988 Rt wrist fx sx  Family History: Last updated: 05/01/2007 Fam hx CAD Fam hx Aneurysm  Social History: Last updated: 06/24/2007 Former Smoker Retired Married Regular exercise-no  Risk Factors: Exercise: no (06/24/2007)  Risk Factors: Smoking Status: quit > 6 months (01/27/2010)  Physical Exam  General:  Well-developed,well-nourished,in no acute distress; alert,appropriate and cooperative throughout examination obese Head:  normocephalic and atraumatic.   Eyes:  pupils equal and pupils round.   Ears:  R ear normal and L ear normal.   Neck:  No  deformities, masses, or tenderness noted. Lungs:  normal respiratory effort and no intercostal retractions.   Heart:  normal rate and regular rhythm.   Abdomen:  soft and non-tender.   Skin:  turgor normal and color normal.   Psych:  normally interactive and good eye contact.    Diabetes Management Exam:    Foot Exam (with socks and/or shoes not present):       Sensory-Monofilament:          Right foot: diminished    Eye Exam:       Eye Exam done elsewhere          Date: 07/29/2010          Results: normal           Done by: ophthalm   Impression & Recommendations:  Problem # 1:  HYPOXEMIA (ICD-799.02)  on home oxygen  Orders: TLB-CBC Platelet - w/Differential (85025-CBCD)  Problem # 2:  DIABETES MELLITUS, TYPE II (ICD-250.00)  check labs  His updated medication list for this problem includes:    Lisinopril 40 Mg Tabs (Lisinopril) .Marland Kitchen... Take 1 tablet by mouth once a day    Metformin Hcl 1000 Mg Tabs (Metformin hcl) .Marland Kitchen... Take 1 tablet by mouth twice a day    Adult Aspirin Low Strength 81 Mg Tbdp (Aspirin) ..... One by mouth daily    Humulin N 100 Unit/ml Susp (Insulin isophane human) .Marland KitchenMarland KitchenMarland KitchenMarland Kitchen 70 units subcutaneously two times a day    Humulin R 100 Unit/ml Soln (Insulin regular human) ..... Inject 32 units subcutaneously three times a day with meals  Labs Reviewed: Creat: 0.9 (05/02/2010)     Last Eye Exam: diabetic retinopathy (07/29/2009) Reviewed HgBA1c results: 7.5 (05/02/2010)  7.3 (01/27/2010)  Orders: TLB-A1C / Hgb A1C (Glycohemoglobin) (83036-A1C)  Problem # 3:  HYPERLIPIDEMIA (ICD-272.4)  His updated medication list for this problem includes:    Simvastatin 80 Mg Tabs (Simvastatin) .Marland Kitchen... 1 by mouth at bedtime  Labs Reviewed: SGOT: 72 (05/02/2010)   SGPT: 88 (05/02/2010)  Lipid Goals: Chol Goal: 200 (06/24/2007)   HDL Goal: 40 (06/24/2007)   LDL Goal: 100 (06/24/2007)   TG Goal: 150 (06/24/2007)  Prior 10 Yr Risk Heart Disease: Not enough information (06/24/2007)   HDL:35.00 (05/02/2010), 39.30 (01/27/2010)  LDL:DEL (09/02/2008), 83 (88/41/6606)  Chol:212 (05/02/2010), 180 (01/27/2010)  Trig:587.0 (05/02/2010), 360.0 (01/27/2010)  Orders: TLB-Lipid Panel (80061-LIPID) TLB-Hepatic/Liver Function Pnl (80076-HEPATIC) TLB-TSH (Thyroid Stimulating Hormone) (84443-TSH)  Problem # 4:  OSTEOARTHRITIS (ICD-715.90) continue current medications  His updated medication list for this problem includes:    Etodolac 300 Mg Caps (Etodolac) .Marland Kitchen... Take 1 capsule by mouth once a day     Adult Aspirin Low Strength 81 Mg Tbdp (Aspirin) ..... One by mouth daily    Hydrocodone-acetaminophen 5-500 Mg Tabs (Hydrocodone-acetaminophen) .Marland Kitchen... Take 1 tablet by mouth two times a day as needed  Problem # 5:  OBESITY (ICD-278.00)  clearly this is his most significant problem advised aggressive weight loss  Complete Medication List: 1)  Etodolac 300 Mg Caps (Etodolac) .... Take 1 capsule by mouth once a day 2)  Relion Insulin Syringe 29g X 1/2" 1 Ml Misc (Insulin syringe-needle u-100) .... Use three times a day -qid as directed 3)  Simvastatin 80 Mg Tabs (Simvastatin) .Marland Kitchen.. 1 by mouth at bedtime 4)  Lisinopril 40 Mg Tabs (Lisinopril) .... Take 1 tablet by mouth once a day 5)  Metformin Hcl 1000 Mg Tabs (Metformin hcl) .... Take 1 tablet by mouth twice a day 6)  Adult Aspirin Low Strength 81 Mg Tbdp (Aspirin) .... One by mouth daily 7)  Centrum Silver Tabs (Multiple vitamins-minerals) .... Once daily 8)  Hydrocodone-acetaminophen 5-500 Mg Tabs (Hydrocodone-acetaminophen) .... Take 1 tablet by mouth two times a day as needed 9)  Demadex 20 Mg Tabs (Torsemide) .... Take 1 tablet by mouth once a day 10)  Humulin N 100 Unit/ml Susp (Insulin isophane human) .... 70 units subcutaneously two times a day 11)  Clotrimazole-betamethasone 1-0.05 % Crea (Clotrimazole-betamethasone) .... Apply small amount to area under arms and between legs two times a day as needed 12)  Humulin R 100 Unit/ml Soln (Insulin regular human) .... Inject 32 units subcutaneously three times a day with meals  Other Orders: Flu Vaccine 32yrs + MEDICARE PATIENTS (Z6109) Administration Flu vaccine - MCR (G0008) Venipuncture (60454) TLB-BMP (Basic Metabolic Panel-BMET) (80048-METABOL) Flu Vaccine Consent Questions     Do you have a history of severe allergic reactions to this vaccine? no    Any prior history of allergic reactions to egg and/or gelatin? no    Do you have a sensitivity to the preservative Thimersol? no    Do  you have a past history of Guillan-Barre Syndrome? no    Do you currently have an acute febrile illness? no    Have you ever had a severe reaction to latex? no    Vaccine information given and explained to patient? yes    Are you currently pregnant? no    Lot Number:[id number]   Exp Date:04/28/2011   Site Given  Left Deltoid IM  Orders Added: 1)  Flu Vaccine 60yrs + MEDICARE PATIENTS [Q2039] 2)  Administration Flu vaccine - MCR [G0008] 3)  Venipuncture [36415] 4)  TLB-BMP (Basic Metabolic Panel-BMET) [80048-METABOL] 5)  TLB-Lipid Panel [80061-LIPID] 6)  TLB-Hepatic/Liver Function Pnl [80076-HEPATIC] 7)  TLB-TSH (Thyroid Stimulating Hormone) [84443-TSH] 8)  TLB-CBC Platelet - w/Differential [85025-CBCD] 9)  TLB-A1C / Hgb A1C (Glycohemoglobin) [83036-A1C] 10)  Est. Patient Level IV [09811]    .[email or web address]    Appended Document: Orders Update    Clinical Lists Changes  Orders: Added new Service order of Specimen Handling (91478) - Signed Added new Service order of Sedimentation Rate, non-automated (29562) - Signed Observations: Added new observation of COMMENTS2: Wynona Canes, CMA  August 30, 2010 11:48 AM  (08/30/2010 11:09)      Laboratory Results   Blood Tests   Date/Time Recieved: August 30, 2010 11:48 AM  Date/Time Reported: August 30, 2010 11:48 AM   SED rate: 12  Comments: Wynona Canes, CMA  August 30, 2010 11:48 AM

## 2010-11-28 NOTE — Assessment & Plan Note (Signed)
Summary: PURULENT SORE ON BACK // RS   Vital Signs:  Patient profile:   65 year old male Temp:     98.6 degrees F oral BP sitting:   110 / 62  (left arm) Cuff size:   large  Vitals Entered By: Sid Falcon LPN (November 07, 2009 1:44 PM) CC: Purulent sore on back X 3 days Is Patient Diabetic? Yes Did you bring your meter with you today? No   History of Present Illness: Acute visit. Patient has type 2 diabetes and this past Friday night noticed a sore swollen area mid thoracic back. No fever. Has noticed some minimal purulent drainage over the past couple of days. Blood sugars have been relatively stable.  Allergies (verified): No Known Drug Allergies  Past History:  Past Medical History: Last updated: 06/24/2007 Hypertension Osteoarthritis Diabetes mellitus, type II Hyperlipidemia OSA  Review of Systems      See HPI  Physical Exam  General:  Well-developed,well-nourished,in no acute distress; alert,appropriate and cooperative throughout examination Heart:  Normal rate and regular rhythm. S1 and S2 normal without gallop, murmur, click, rub or other extra sounds. Skin:  mid back thoracic region reveals an area of erythema approximately 2 x 3 cm. Near center there is an area of fluctuance approximately 1 cm diameter.   Impression & Recommendations:  Problem # 1:  BACK ABSCESS (ICD-682.2)  Discussed risks and benefits of procedure including risks of bleeding, bruising,scarring, and patient consents to proceed with I and D of abscess. Skin prepped with Betadine. Anesthesia with 1% plain Xylocaine. Linear incision with #11 blade over area of fluctuance. And contents of sebaceous cyst sac were expressed. The wound cavity packed with gauze with minimal bleeding.  Topical antibiotic and dressing applied.   return in 2 days for packing removal  Orders: I&D Abscess, Complex (10061)  Complete Medication List: 1)  Etodolac 300 Mg Caps (Etodolac) .... Take 1 capsule by mouth  once a day 2)  Relion Insulin Syringe 29g X 1/2" 1 Ml Misc (Insulin syringe-needle u-100) .... Use three times a day -qid as directed 3)  Simvastatin 80 Mg Tabs (Simvastatin) .Marland Kitchen.. 1 by mouth at bedtime 4)  Lisinopril 40 Mg Tabs (Lisinopril) .... Take 1 tablet by mouth once a day 5)  Metformin Hcl 1000 Mg Tabs (Metformin hcl) .... Take 1 tablet by mouth twice a day 6)  Adult Aspirin Low Strength 81 Mg Tbdp (Aspirin) .... One by mouth daily 7)  Onetouch Ultra Test Strp (Glucose blood) .... Use three times a day as directed 8)  Centrum Silver Tabs (Multiple vitamins-minerals) .... Once daily 9)  Hydrocodone-acetaminophen 5-500 Mg Tabs (Hydrocodone-acetaminophen) .... As needed 10)  Demadex 20 Mg Tabs (Torsemide) .... Take 1 tablet by mouth once a day 11)  Humulin N 100 Unit/ml Susp (Insulin isophane human) .... 65 units subcutaneously two times a day 12)  Clotrimazole-betamethasone 1-0.05 % Crea (Clotrimazole-betamethasone) .... Apply small amount to area under arms and between legs two times a day as needed 13)  Humulin R 100 Unit/ml Soln (Insulin regular human) .... Inject 28 units subcutaneously three times a day with meals  Patient Instructions: 1)  Schedule followup in 2 days with your primary physician for packing removal and reassessment. 2)  Change outer dressing if significant drainage is noted

## 2010-11-28 NOTE — Medication Information (Signed)
Summary: Order for Diabetic Shoes  Order for Diabetic Shoes   Imported By: Maryln Gottron 03/28/2010 13:10:09  _____________________________________________________________________  External Attachment:    Type:   Image     Comment:   External Document

## 2010-11-28 NOTE — Progress Notes (Signed)
Summary: insulin problem  Phone Note Call from Patient   Caller: live Call For: [name] Summary of Call: Says he only got 1 vial of the Humilin R & has to take 28 units three times a day.  When he questioned [facility], he was told the Rx was for 1/2 vial.  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  Patient aware. Initial call taken by: Rudy Jew, RN,  November 07, 2009 3:25 PM

## 2010-11-28 NOTE — Progress Notes (Signed)
Summary: needs more pain med  Phone Note Call from Patient Call back at Home Phone [phone]   Caller: Patient Call For: Birdie Sons MD Summary of Call: Arthritic arm hurting for 5 days; taking 2 Vicodin at a time- also on Etodolac. Doesn't want to get hooked on pain meds, is there anything else to take? [facility] Initial call taken by: Raechel Ache, RN,  February 15, 2010 8:18 AM  Follow-up for Phone Call        nothing else to add.  would advise that he ice the part of the arm that is hurthing Follow-up by: Birdie Sons MD,  February 16, 2010 6:31 AM  Additional Follow-up for Phone Call Additional follow up Details #1::        Pt feels better today. Additional Follow-up by: Lynann Beaver CMA,  February 16, 2010 9:01 AM

## 2010-11-28 NOTE — Assessment & Plan Note (Signed)
Summary: 4 month rov/njr   Vital Signs:  Patient profile:   65 year old male Weight:      341 pounds BMI:     52.04 Temp:     98.3 degrees F Pulse rate:   88 / minute Pulse rhythm:   regular BP sitting:   124 / 75  Vitals Entered By: Gladis Riffle, RN (November 04, 2009 8:24 AM)  Nutrition Counseling: Patient's BMI is greater than 25 and therefore counseled on weight management options.   History of Present Illness:  Follow-Up Visit      This is a 65 year old man who presents for Follow-up visit.  The patient denies chest pain, palpitations, dizziness, syncope, edema, SOB, DOE, PND, and orthopnea.  Since the last visit the patient notes no new problems or concerns.  The patient reports taking meds as prescribed.  When questioned about possible medication side effects, the patient notes none.   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 Take 1 Capsule By Mouth Once A Day 2)  Relion Insulin Syringe 29g X 1/2" 1 Ml Misc (Insulin Syringe-Needle U-100) .... Use Three Times A Day -Qid As Directed 3)  Simvastatin 80 Mg Tabs (Simvastatin) .Marland Kitchen.. 1 By Mouth At Bedtime 4)  Lisinopril 40 Mg Tabs (Lisinopril) .... Take 1 Tablet By Mouth Once A Day 5)  Metformin Hcl 1000 Mg Tabs (Metformin Hcl) .... Take 1 Tablet By Mouth Twice A Day 6)  Adult Aspirin Low Strength 81 Mg  Tbdp (Aspirin) .... One By Mouth Daily 7)  Onetouch Ultra Test   Strp (Glucose Blood) .... Use Three Times A Day As  Directed 8)  Centrum Silver   Tabs (Multiple Vitamins-Minerals) .... Once Daily 9)  Hydrocodone-Acetaminophen 5-500 Mg  Tabs (Hydrocodone-Acetaminophen) .... As Needed 10)  Demadex 20 Mg  Tabs (Torsemide) .... Take 1 Tablet By Mouth Once A Day 11)  Humulin N 100 Unit/ml Susp (Insulin Isophane Human) .... 65 Units Subcutaneously Two Times A Day 12)  Clotrimazole-Betamethasone 1-0.05 % Crea (Clotrimazole-Betamethasone) .... Apply Small Amount To Area Under Arms and Between Legs Two Times A Day As Needed 13)  Humulin R 100 Unit/ml Soln (Insulin Regular Human) .... Inject 25 Units Subcutaneously Three Times A Day  Allergies (verified): No Known Drug Allergies  Past History:  Past Medical History: Last updated: 06/24/2007 Hypertension Osteoarthritis Diabetes mellitus, type II Hyperlipidemia OSA  Past Surgical History: Last updated: 05/01/2007 Appendectomy Back sx Rt shoulder sx-1988 Rt wrist fx sx  Family History: Last updated: 05/01/2007 Fam hx CAD Fam hx Aneurysm  Social History: Last updated: 06/24/2007 Former Smoker Retired Married Regular exercise-no  Risk Factors: Exercise: no (06/24/2007)  Risk Factors: Smoking Status: quit > 6 months (11/04/2009)  Review of Systems       All other systems reviewed and were negative   Physical Exam  General:  Well-developed,well-nourished,in no acute distress; alert,appropriate and cooperative throughout examination morbidly obese morbid obesity Head:  normocephalic and atraumatic.   Eyes:  pupils equal and pupils round.   Ears:  R ear normal and L ear normal.   Neck:  No deformities, masses, or tenderness noted. Chest Wall:  No deformities, masses, tenderness or gynecomastia noted. Lungs:  Normal respiratory effort, chest expands symmetrically. Lungs are clear to auscultation, no crackles or wheezes. Heart:  Normal rate and regular rhythm. S1 and S2 normal without gallop, murmur, click, rub or other extra sounds. Msk:   No deformity or scoliosis noted of thoracic or lumbar spine.   Neurologic:  cranial nerves II-XII intact and gait normal.   Skin:  turgor normal and color normal.   Psych:  normally interactive and good eye contact.     Impression & Recommendations:  Problem # 1:  DIABETES MELLITUS, TYPE II (ICD-250.00)  check labs today His updated medication list for this problem includes:    Lisinopril 40 Mg Tabs (Lisinopril) .Marland Kitchen... Take 1 tablet by mouth once a day    Metformin Hcl 1000 Mg Tabs (Metformin hcl) .Marland Kitchen... Take 1 tablet by mouth twice a day    Adult Aspirin Low Strength 81 Mg Tbdp (Aspirin) ..... One by mouth daily    Humulin N 100 Unit/ml Susp (Insulin isophane human) .Marland KitchenMarland KitchenMarland KitchenMarland Kitchen 65 units subcutaneously two times a day    Humulin R 100 Unit/ml Soln (Insulin regular human) ..... Inject 28 units subcutaneously three times a day with meals  Labs Reviewed: Creat: 1.0 (07/27/2009)     Last Eye Exam: diabetic retinopathy (07/29/2009) Reviewed HgBA1c results: 7.3 (07/01/2009)  7.0 (02/25/2009)  Problem # 2:  HYPERTENSION (ICD-401.9) Assessment: Improved  His updated medication list for this problem includes:    Lisinopril 40 Mg Tabs (Lisinopril) .Marland Kitchen... Take 1 tablet by mouth once a day    Demadex 20 Mg Tabs (Torsemide) .Marland Kitchen... Take 1 tablet by mouth once a day  Orders: Venipuncture (16109)  Problem # 3:  HYPERLIPIDEMIA (ICD-272.4) well controlled check labs today His updated medication list for this problem includes:    Simvastatin 80 Mg Tabs (Simvastatin) .Marland Kitchen... 1 by mouth at bedtime  Orders: Venipuncture (60454)  Labs Reviewed: SGOT: 35 (09/02/2008)   SGPT: 40 (07/01/2009)  Lipid Goals: Chol Goal: 200 (06/24/2007)   HDL Goal: 40 (06/24/2007)   LDL Goal: 100 (06/24/2007)   TG Goal: 150 (06/24/2007)  Prior 10 Yr Risk Heart Disease: Not enough information (06/24/2007)   HDL:37.20 (07/01/2009), 32.00 (02/25/2009)  LDL:DEL (09/02/2008), 83 (09/81/1914)  Chol:148 (07/01/2009), 144  (02/25/2009)  Trig:309.0 (07/01/2009), 285.0 (02/25/2009)  Complete Medication List: 1)  Etodolac 300 Mg Caps (Etodolac) .... Take 1 capsule by mouth once a day 2)  Relion Insulin Syringe 29g X 1/2" 1 Ml Misc (Insulin syringe-needle u-100) .... Use three times a day -qid as directed 3)  Simvastatin 80 Mg Tabs (Simvastatin) .Marland Kitchen.. 1 by mouth at bedtime 4)  Lisinopril 40 Mg Tabs (Lisinopril) .... Take 1 tablet by mouth once a day 5)  Metformin Hcl 1000 Mg Tabs (Metformin hcl) .... Take 1 tablet by mouth twice a day 6)  Adult Aspirin Low Strength 81 Mg Tbdp (Aspirin) .... One by mouth daily 7)  Onetouch Ultra Test Strp (Glucose blood) .... Use three times a day as directed 8)  Centrum Silver Tabs (Multiple vitamins-minerals) .... Once daily 9)  Hydrocodone-acetaminophen 5-500 Mg Tabs (Hydrocodone-acetaminophen) .... As needed 10)  Demadex 20 Mg Tabs (Torsemide) .... Take 1 tablet by mouth once a day 11)  Humulin N 100 Unit/ml Susp (Insulin isophane human) .... 65 units subcutaneously two times a day  12)  Clotrimazole-betamethasone 1-0.05 % Crea (Clotrimazole-betamethasone) .... Apply small amount to area under arms and between legs two times a day as needed 13)  Humulin R 100 Unit/ml Soln (Insulin regular human) .... Inject 28 units subcutaneously three times a day with meals  Other Orders: TLB-A1C / Hgb A1C (Glycohemoglobin) (83036-A1C) TLB-BMP (Basic Metabolic Panel-BMET) (80048-METABOL) TLB-Lipid Panel (80061-LIPID) TLB-ALT (SGPT) (84460-ALT)  Patient Instructions: 1)  Please schedule a follow-up appointment in 3 months. Prescriptions: HUMULIN R 100 UNIT/ML SOLN (INSULIN REGULAR HUMAN) Inject 28 units subcutaneously three times a day with meals  #5 vials x 3   Entered and Authorized by:   Birdie Sons MD   Signed by:   Birdie Sons MD on 11/04/2009   Method used:   Electronically to        [facility] [address] (retail)       [phone]       [place], [place]  [address]       Ph: [phone]       Fax: [phone]   RxID:   [id number]

## 2010-11-28 NOTE — Letter (Signed)
Summary: Medical Necessity for Oxygen  Medical Necessity for Oxygen   Imported By: Maryln Gottron 02/09/2010 10:11:52  _____________________________________________________________________  External Attachment:    Type:   Image     Comment:   External Document

## 2010-11-28 NOTE — Progress Notes (Signed)
Summary: refill  Phone Note Call from Patient Call back at Home Phone [phone]   Summary of Call: Saw Dr. Cato Mulligan couple weeks ago & he upped Humilin N from 65 to 70 units two times a day and sent Rx in on his computer.  When I got there today to pick up Rx it said 50 units two times a day.  It's supposed to be 70 two times a day.  Not going to have enough to take.  You need to straighten this out.  [facility].  They did receive the escript update of 4-1 but for some reason that they can't explain, it was canceled.  Rx resent as advised.  Patient aware & will call [facility].  Rudy Jew, RN  February 13, 2010 2:00 PM  Initial call taken by: Rudy Jew, RN,  February 13, 2010 1:50 PM

## 2010-11-28 NOTE — Assessment & Plan Note (Signed)
Summary: 2 day rov/njr   Vital Signs:  Patient profile:   65 year old male Temp:     98.8 degrees F oral BP sitting:   130 / 62  (left arm) Cuff size:   large  Vitals Entered By: Sid Falcon LPN (November 09, 2009 2:03 PM) CC: 2 day follow-up back absess   History of Present Illness: Followup abscess back. Some itching from tape otherwise no problems. They have not changed outer dressing. No fevers or chills. No pain. Blood sugar stable.  Allergies: No Known Drug Allergies  Past History:  Past Medical History: Last updated: 06/24/2007 Hypertension Osteoarthritis Diabetes mellitus, type II Hyperlipidemia OSA  Review of Systems      See HPI  Physical Exam  Skin:  wound is examined mid thoracic area back. Packing removed. Minimal purulent drainage. Wound cavity looks good. No visible residual cyst. He has some mild erythema from adhesive tape but no signs of cellulitis. No warmth and No tenderness.   Impression & Recommendations:  Problem # 1:  BACK ABSCESS (ICD-682.2) Assessment Improved keep clean soap and water. Avoid adhesive tape.  Complete Medication List: 1)  Etodolac 300 Mg Caps (Etodolac) .... Take 1 capsule by mouth once a day 2)  Relion Insulin Syringe 29g X 1/2" 1 Ml Misc (Insulin syringe-needle u-100) .... Use three times a day -qid as directed 3)  Simvastatin 80 Mg Tabs (Simvastatin) .Marland Kitchen.. 1 by mouth at bedtime 4)  Lisinopril 40 Mg Tabs (Lisinopril) .... Take 1 tablet by mouth once a day 5)  Metformin Hcl 1000 Mg Tabs (Metformin hcl) .... Take 1 tablet by mouth twice a day 6)  Adult Aspirin Low Strength 81 Mg Tbdp (Aspirin) .... One by mouth daily 7)  Onetouch Ultra Test Strp (Glucose blood) .... Use three times a day as directed 8)  Centrum Silver Tabs (Multiple vitamins-minerals) .... Once daily 9)  Hydrocodone-acetaminophen 5-500 Mg Tabs (Hydrocodone-acetaminophen) .... As needed 10)  Demadex 20 Mg Tabs (Torsemide) .... Take 1 tablet by mouth once a  day 11)  Humulin N 100 Unit/ml Susp (Insulin isophane human) .... 65 units subcutaneously two times a day 12)  Clotrimazole-betamethasone 1-0.05 % Crea (Clotrimazole-betamethasone) .... Apply small amount to area under arms and between legs two times a day as needed 13)  Humulin R 100 Unit/ml Soln (Insulin regular human) .... Inject 28 units subcutaneously three times a day with meals  Patient Instructions: 1)  Keep wound clean with soap and water. 2)  Follow up promptly for any signs of secondary infection such as redness, warmth, or pain. 3)  Keep followup with your primary physician as scheduled.

## 2010-11-28 NOTE — Assessment & Plan Note (Signed)
Summary: 3 mo rov/mm   Vital Signs:  Patient profile:   65 year old male Weight:      333 pounds Temp:     98.6 degrees F oral Pulse rate:   70 / minute Pulse rhythm:   regular Resp:     16 per minute BP sitting:   128 / 70  (left arm) Cuff size:   large  Vitals Entered By: Gladis Riffle, RN (January 27, 2010 10:52 AM)  O2 Flow:  2 L/min CC: 3 month rov--CBGs elevated at home so has skipped some meals, 148-219 range Is Patient Diabetic? Yes Did you bring your meter with you today? No   CC:  3 month rov--CBGs elevated at home so has skipped some meals and 148-219 range.  History of Present Illness:  Follow-Up Visit      This is a 65 year old man who presents for Follow-up visit.  The patient denies chest pain and palpitations.  Since the last visit the patient notes no new problems or concerns.  The patient reports taking meds as prescribed and not monitoring BP.  When questioned about possible medication side effects, the patient notes none.  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 Take 1 Capsule By Mouth Once A Day 2)  Relion Insulin Syringe 29g X 1/2" 1 Ml Misc (Insulin Syringe-Needle U-100) .... Use Three Times A Day -Qid As Directed 3)  Simvastatin 80 Mg Tabs (Simvastatin) .Marland Kitchen.. 1 By Mouth At Bedtime 4)  Lisinopril 40 Mg Tabs (Lisinopril) .... Take 1 Tablet By Mouth Once A Day 5)  Metformin Hcl 1000 Mg Tabs (Metformin Hcl) .... Take 1 Tablet By Mouth Twice A Day 6)   Adult Aspirin Low Strength 81 Mg  Tbdp (Aspirin) .... One By Mouth Daily 7)  Onetouch Ultra Test   Strp (Glucose Blood) .... Use Three Times A Day As Directed 8)  Centrum Silver   Tabs (Multiple Vitamins-Minerals) .... Once Daily 9)  Hydrocodone-Acetaminophen 5-500 Mg  Tabs (Hydrocodone-Acetaminophen) .... As Needed 10)  Demadex 20 Mg  Tabs (Torsemide) .... Take 1 Tablet By Mouth Once A Day 11)  Humulin N 100 Unit/ml Susp (Insulin Isophane Human) .... 65 Units Subcutaneously Two Times A Day 12)  Clotrimazole-Betamethasone 1-0.05 % Crea (Clotrimazole-Betamethasone) .... Apply Small Amount To Area Under Arms and Between Legs Two Times A Day As Needed 13)  Humulin R 100 Unit/ml Soln (Insulin Regular Human) .... Inject 28 Units Subcutaneously Three Times A Day With Meals  Allergies (verified): No Known Drug Allergies  Past History:  Past Medical History: Last updated: 06/24/2007 Hypertension Osteoarthritis Diabetes mellitus, type II Hyperlipidemia OSA  Past Surgical History: Last updated: 05/01/2007 Appendectomy Back sx Rt shoulder sx-1988 Rt wrist fx sx  Family History: Last updated: 05/01/2007 Fam hx CAD Fam hx Aneurysm  Social History: Last updated: 06/24/2007 Former Smoker Retired Married Regular exercise-no  Risk Factors: Exercise: no (06/24/2007)  Risk Factors: Smoking Status: quit > 6 months (01/27/2010)  Physical Exam  General:  Well-developed,well-nourished,in no acute distress; alert,appropriate and cooperative throughout examination Head:  normocephalic and atraumatic.   Eyes:  pupils equal and pupils round.   Neck:  No deformities, masses, or tenderness noted. Chest Wall:  No deformities, masses, tenderness or gynecomastia noted. Lungs:  Normal respiratory effort, chest expands symmetrically. Lungs are clear to auscultation, no crackles or wheezes. Abdomen:  soft and non-tender.   morbid obesity Msk:  No deformity or scoliosis noted of thoracic or  lumbar spine.   Neurologic:  cranial nerves II-XII intact and gait normal.     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 Take 1 tablet by mouth once a day    Metformin Hcl 1000 Mg Tabs (Metformin hcl) .Marland Kitchen... Take 1 tablet by mouth twice a day    Adult Aspirin Low Strength 81 Mg Tbdp (Aspirin) ..... One by mouth daily    Humulin N 100 Unit/ml Susp (Insulin isophane human) .Marland KitchenMarland KitchenMarland KitchenMarland Kitchen 70 units subcutaneously two times a day    Humulin R 100 Unit/ml Soln (Insulin regular human) ..... Inject 32 units subcutaneously three times a day with meals  Orders: TLB-Lipid Panel (80061-LIPID) TLB-Hepatic/Liver Function Pnl (80076-HEPATIC) TLB-TSH (Thyroid Stimulating Hormone) (84443-TSH) TLB-A1C / Hgb A1C (Glycohemoglobin) (83036-A1C)  Problem # 3:  OBESITY (ICD-278.00) discussed need for weight loss he will not change diet or try to exercise  Problem # 4:  HYPERLIPIDEMIA (ICD-272.4) controlled continue current medications  His updated medication list for this problem includes:    Simvastatin 80 Mg Tabs (Simvastatin) .Marland Kitchen... 1 by mouth at bedtime  Labs Reviewed: SGOT: 35 (09/02/2008)   SGPT: 68 (11/04/2009)  Lipid Goals: Chol Goal: 200 (06/24/2007)   HDL Goal: 40 (06/24/2007)   LDL Goal: 100 (06/24/2007)   TG Goal: 150 (06/24/2007)  Prior 10 Yr Risk Heart Disease: Not enough information (06/24/2007)   HDL:36.10 (11/04/2009), 37.20 (07/01/2009)  LDL:DEL (09/02/2008), 83 (16/07/9603)  Chol:185 (11/04/2009), 148 (07/01/2009)  Trig:473.0 (11/04/2009), 309.0 (07/01/2009)  Complete Medication List: 1)  Etodolac 300 Mg Caps (Etodolac) .... Take 1 capsule by mouth once a day 2)  Relion Insulin Syringe 29g X 1/2" 1 Ml Misc (Insulin  syringe-needle u-100) .... Use three times a day -qid as directed 3)  Simvastatin 80 Mg Tabs (Simvastatin) .Marland Kitchen.. 1 by mouth at bedtime 4)  Lisinopril 40 Mg Tabs (Lisinopril) .... Take 1 tablet by mouth once a day 5)  Metformin Hcl 1000 Mg Tabs (Metformin hcl) .... Take 1 tablet by mouth twice a day 6)  Adult Aspirin Low Strength 81 Mg Tbdp (Aspirin) .... One by mouth daily 7)  Onetouch Ultra Test Strp (Glucose blood) .... Use three times a day as directed 8)  Centrum Silver Tabs (Multiple vitamins-minerals) .... Once daily 9)  Hydrocodone-acetaminophen 5-500 Mg Tabs (Hydrocodone-acetaminophen) .... Take 1 tablet by mouth two times a day as needed 10)  Demadex 20 Mg Tabs (Torsemide) .... Take 1 tablet by mouth once a day 11)  Humulin N 100 Unit/ml Susp (Insulin isophane human) .... 70 units subcutaneously two times a day 12)  Clotrimazole-betamethasone 1-0.05 % Crea (Clotrimazole-betamethasone) .... Apply small amount to area under arms and between legs two times a day as needed 13)  Humulin R 100 Unit/ml Soln (Insulin regular human) .... Inject 32 units subcutaneously three  times a day with meals  Other Orders: Venipuncture (60454) TLB-BMP (Basic Metabolic Panel-BMET) (80048-METABOL)  Patient Instructions: 1)  Please schedule a follow-up appointment in 3 months. Prescriptions: HYDROCODONE-ACETAMINOPHEN 5-500 MG  TABS (HYDROCODONE-ACETAMINOPHEN) as needed  #60 x 0   Entered and Authorized by:   Birdie Sons MD   Signed by:   Birdie Sons MD on 01/27/2010   Method used:   Print then Give to Patient   RxID:   [id number] METFORMIN HCL 1000 MG TABS (METFORMIN HCL) Take 1 tablet by mouth twice a day  #180 x 3   Entered and Authorized by:   Birdie Sons MD   Signed by:   Birdie Sons MD on 01/27/2010   Method used:   Electronically to        [facility] [address] (retail)       [phone]       [place], [place]  [address]       Ph: [phone]        Fax: [phone]   RxID:   [id number] LISINOPRIL 40 MG TABS (LISINOPRIL) Take 1 tablet by mouth once a day  #90 x 3   Entered and Authorized by:   Birdie Sons MD   Signed by:   Birdie Sons MD on 01/27/2010   Method used:   Electronically to        [facility] [address] (retail)       [phone]       [place], [place]  [address]       Ph: [phone]       Fax: [phone]   RxID:   [id number] SIMVASTATIN 80 MG TABS (SIMVASTATIN) 1 by mouth at bedtime  #90 x 3   Entered and Authorized by:   Birdie Sons MD   Signed by:   Birdie Sons MD on 01/27/2010   Method used:   Electronically to        [facility] [address] (retail)       [phone]       [place], [place]  [address]       Ph: [phone]       Fax: [phone]   RxID:   [id number] ETODOLAC 300 MG CAPS (ETODOLAC) Take 1 capsule by mouth once a day  #90 x 3   Entered and Authorized by:   Birdie Sons MD   Signed by:   Birdie Sons MD on 01/27/2010   Method used:   Electronically to        [facility] [address] (retail)       [phone]       [place], [place]  [address]       Ph: [phone]       Fax: [phone]   RxID:   [id number] DEMADEX 20 MG  TABS (TORSEMIDE) Take 1 tablet by mouth once a day  #90 x 3   Entered and Authorized by:   Birdie Sons MD   Signed by:   Birdie Sons MD on 01/27/2010   Method used:   Electronically to        [facility] [address] (retail)       [phone]       [facility]  [place], [place]  [address]       Ph: [phone]       Fax: [phone]   RxID:   [id number] HUMULIN R 100 UNIT/ML SOLN (INSULIN REGULAR HUMAN) Inject 32 units subcutaneously three times a day with meals  #5 vials x PRN   Entered and Authorized by:   Birdie Sons MD   Signed by:   Birdie Sons MD on 01/27/2010   Method used:   Electronically to         [facility] [place] [facility] [address] (retail)       [phone]       [place], [place]  [address]       Ph: [phone]       Fax: [phone]   RxID:   [id number] HUMULIN N 100 UNIT/ML SUSP (INSULIN ISOPHANE HUMAN) 70 units subcutaneously two times a day  #5 vials x PRN   Entered and Authorized by:   Birdie Sons MD   Signed by:   Birdie Sons MD on 01/27/2010   Method used:   Electronically to        [facility] [address] (retail)       [phone]       [place], [place]  [address]       Ph: [phone]       Fax: [phone]   RxID:   [id number]

## 2010-11-28 NOTE — Progress Notes (Signed)
Summary: humulin r refill  Phone Note Refill Request Message from:  Patient on June 05, 2010 8:32 AM  Refills Requested: Medication #1:  HUMULIN R 100 UNIT/ML SOLN Inject 32 units subcutaneously three times a day with meals.   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    Initial call taken by: Debbra Riding,  June 05, 2010 8:33 AM    Prescriptions: HUMULIN R 100 UNIT/ML SOLN (INSULIN REGULAR HUMAN) Inject 32 units subcutaneously three times a day with meals  #5 vials x PRN   Entered by:   Gladis Riffle, RN   Authorized by:   Birdie Sons MD   Signed by:   Gladis Riffle, RN on 06/05/2010   Method used:   Electronically to        [facility] [address] (retail)       [phone]       [place], [place]  [address]       Ph: [phone]       Fax: [phone]   RxID:   [id number] HUMULIN N 100 UNIT/ML SUSP (INSULIN ISOPHANE HUMAN) 70 units subcutaneously two times a day  #5 vials x PRN   Entered by:   Gladis Riffle, RN   Authorized by:   Birdie Sons MD   Signed by:   Gladis Riffle, RN on 06/05/2010   Method used:   Electronically to        [facility] [address] (retail)       [phone]       [place], [place]  [address]       Ph: [phone]       Fax: [phone]   RxID:   [id number]

## 2010-11-28 NOTE — Assessment & Plan Note (Signed)
Summary: 3 month rov/njr/pt rsc from bmp/cjr   Vital Signs:  Patient profile:   65 year old male Weight:      339 pounds BMI:     51.73 Pulse rhythm:   regular Resp:     16 per minute BP sitting:   140 / 80  Vitals Entered By: Gladis Riffle, RN (May 02, 2010 11:19 AM)  Nutrition Counseling: Patient's BMI is greater than 25 and therefore counseled on weight management options. CC: 3 month rov Comments O2 at 2L   CC:  3 month rov.  History of Present Illness:  Follow-Up Visit      This is a 65 year old man who presents for Follow-up visit.  The patient denies chest pain and palpitations.  Since the last visit the patient notes no new problems or concerns.  The patient reports taking meds as prescribed.  When questioned about possible medication side effects, the patient notes none.    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 Take 1 Capsule By Mouth Once A Day 2)  Relion Insulin Syringe 29g X 1/2" 1 Ml Misc (Insulin Syringe-Needle U-100) .... Use Three Times A Day -Qid As Directed 3)  Simvastatin 80 Mg Tabs (Simvastatin) .Marland Kitchen.. 1 By Mouth At Bedtime 4)  Lisinopril 40 Mg Tabs (Lisinopril) .... Take 1 Tablet By Mouth Once A Day 5)  Metformin Hcl 1000 Mg Tabs (Metformin Hcl) .... Take 1 Tablet By Mouth Twice A Day 6)  Adult Aspirin Low Strength 81 Mg  Tbdp (Aspirin) .... One By Mouth Daily 7)  Onetouch Ultra Test   Strp (Glucose Blood) .... Use Three Times A Day As Directed 8)  Centrum Silver   Tabs (Multiple Vitamins-Minerals) .... Once Daily 9)  Hydrocodone-Acetaminophen 5-500 Mg  Tabs (Hydrocodone-Acetaminophen)  .... Take 1 Tablet By Mouth Two Times A Day As Needed 10)  Demadex 20 Mg  Tabs (Torsemide) .... Take 1 Tablet By Mouth Once A Day 11)  Humulin N 100 Unit/ml Susp (Insulin Isophane Human) .... 70 Units Subcutaneously Two Times A Day 12)  Clotrimazole-Betamethasone 1-0.05 % Crea (Clotrimazole-Betamethasone) .... Apply Small Amount To Area Under Arms and Between Legs Two Times A Day As Needed 13)  Humulin R 100 Unit/ml Soln (Insulin Regular Human) .... Inject 32 Units Subcutaneously Three Times A Day With Meals  Allergies (verified): No Known Drug Allergies  Past History:  Past Medical History: Last updated: 06/24/2007 Hypertension Osteoarthritis Diabetes mellitus, type II Hyperlipidemia OSA  Past Surgical History: Last updated: 05/01/2007 Appendectomy Back sx Rt shoulder sx-1988 Rt wrist fx sx  Family History: Last updated: 05/01/2007 Fam hx CAD Fam hx Aneurysm  Social History: Last updated: 06/24/2007 Former Smoker Retired Married Regular exercise-no  Risk Factors: Exercise: no (06/24/2007)  Risk Factors: Smoking Status: quit > 6 months (01/27/2010)  Physical Exam  General:  Well-developed,well-nourished,in no acute distress; alert,appropriate and cooperative throughout examination obese Head:  normocephalic and atraumatic.   Eyes:  pupils equal and pupils round.   Ears:  R ear normal and L ear normal.   Neck:  No deformities, masses, or tenderness noted. Chest Wall:  No deformities, masses, tenderness or gynecomastia  noted. Lungs:  normal respiratory effort and no intercostal retractions.   Heart:  normal rate and regular rhythm.   Abdomen:  soft and non-tender.   morbid obesity Msk:  No deformity or scoliosis noted of thoracic or lumbar spine.   Neurologic:  cranial nerves II-XII intact and gait normal.   Skin:  turgor normal and color normal.   Psych:  good eye contact and not anxious appearing.     Impression & Recommendations:  Problem # 1:   HYPOXEMIA (ICD-799.02)  HOme 02 requiring  Orders: TLB-CBC Platelet - w/Differential (85025-CBCD)  Problem # 2:  UNSPEC COMBINED SYSTOLIC&DIASTOLIC HEART FAILURE (ICD-428.40) likely cause of above  His updated medication list for this problem includes:    Lisinopril 40 Mg Tabs (Lisinopril) .Marland Kitchen... Take 1 tablet by mouth once a day    Adult Aspirin Low Strength 81 Mg Tbdp (Aspirin) ..... One by mouth daily    Demadex 20 Mg Tabs (Torsemide) .Marland Kitchen... Take 1 tablet by mouth once a day  Problem # 3:  DIABETES MELLITUS, TYPE II (ICD-250.00)  will check labs and then adjust meds His updated medication list for this problem includes:    Lisinopril 40 Mg Tabs (Lisinopril) .Marland Kitchen... Take 1 tablet by mouth once a day    Metformin Hcl 1000 Mg Tabs (Metformin hcl) .Marland Kitchen... Take 1 tablet by mouth twice a day    Adult Aspirin Low Strength 81 Mg Tbdp (Aspirin) ..... One by mouth daily    Humulin N 100 Unit/ml Susp (Insulin isophane human) .Marland KitchenMarland KitchenMarland KitchenMarland Kitchen 70 units subcutaneously two times a day    Humulin R 100 Unit/ml Soln (Insulin regular human) ..... Inject 32 units subcutaneously three times a day with meals  Labs Reviewed: Creat: 0.9 (01/27/2010)     Last Eye Exam: diabetic retinopathy (07/29/2009) Reviewed HgBA1c results: 7.3 (01/27/2010)  7.3 (11/04/2009)  Orders: Venipuncture ([address]) TLB-BMP (Basic Metabolic Panel-BMET) (80048-METABOL) TLB-Lipid Panel (80061-LIPID)  Problem # 4:  OBESITY (ICD-278.00) clearly this is his most significant problem advised aggressive weight loss  Problem # 5:  HYPERTENSION (ICD-401.9) continue current medications may need additional labs His updated medication list for this problem includes:    Lisinopril 40 Mg Tabs (Lisinopril) .Marland Kitchen... Take 1 tablet by mouth once a day    Demadex 20 Mg Tabs (Torsemide) .Marland Kitchen... Take 1 tablet by mouth once a day  BP today: 140/80 Prior BP: 128/70 (01/27/2010)  Prior 10 Yr Risk Heart Disease: Not enough information (06/24/2007)  Labs  Reviewed: K+: 4.7 (01/27/2010) Creat: : 0.9 (01/27/2010)   Chol: 180 (01/27/2010)   HDL: 39.30 (01/27/2010)   LDL: DEL (09/02/2008)   TG: 360.0 (01/27/2010)  Complete Medication List: 1)  Etodolac 300 Mg Caps (Etodolac) .... Take 1 capsule by mouth once a day 2)  Relion Insulin Syringe 29g X 1/2" 1 Ml Misc (Insulin syringe-needle u-100) .... Use three times a day -qid as directed 3)  Simvastatin 80 Mg Tabs (Simvastatin) .Marland Kitchen.. 1 by mouth at bedtime 4)  Lisinopril 40 Mg Tabs (Lisinopril) .... Take 1 tablet by mouth once a day 5)  Metformin Hcl 1000 Mg Tabs (Metformin hcl) .... Take 1 tablet by mouth twice a day 6)  Adult Aspirin Low Strength 81 Mg Tbdp (Aspirin) .... One by mouth daily 7)  Onetouch Ultra Test Strp (Glucose blood) .... Use three times a day as directed 8)  Centrum Silver Tabs (Multiple vitamins-minerals) .... Once daily 9)  Hydrocodone-acetaminophen 5-500 Mg Tabs (Hydrocodone-acetaminophen) .... Take 1 tablet by mouth two times a day  as needed 10)  Demadex 20 Mg Tabs (Torsemide) .... Take 1 tablet by mouth once a day 11)  Humulin N 100 Unit/ml Susp (Insulin isophane human) .... 70 units subcutaneously two times a day 12)  Clotrimazole-betamethasone 1-0.05 % Crea (Clotrimazole-betamethasone) .... Apply small amount to area under arms and between legs two times a day as needed 13)  Humulin R 100 Unit/ml Soln (Insulin regular human) .... Inject 32 units subcutaneously three times a day with meals  Other Orders: TLB-Hepatic/Liver Function Pnl (80076-HEPATIC) TLB-TSH (Thyroid Stimulating Hormone) (84443-TSH)  Appended Document: Orders Update    Clinical Lists Changes  Orders: Added new Test order of TLB-A1C / Hgb A1C (Glycohemoglobin) (83036-A1C) - Signed

## 2010-12-14 ENCOUNTER — Encounter: Payer: Self-pay | Admitting: Internal Medicine

## 2010-12-15 ENCOUNTER — Encounter: Payer: Self-pay | Admitting: Internal Medicine

## 2010-12-15 ENCOUNTER — Ambulatory Visit (INDEPENDENT_AMBULATORY_CARE_PROVIDER_SITE_OTHER): Payer: PRIVATE HEALTH INSURANCE | Admitting: Internal Medicine

## 2010-12-15 DIAGNOSIS — R0902 Hypoxemia: Secondary | ICD-10-CM

## 2010-12-15 DIAGNOSIS — E119 Type 2 diabetes mellitus without complications: Secondary | ICD-10-CM

## 2010-12-15 DIAGNOSIS — I10 Essential (primary) hypertension: Secondary | ICD-10-CM

## 2010-12-15 DIAGNOSIS — E785 Hyperlipidemia, unspecified: Secondary | ICD-10-CM

## 2010-12-15 LAB — CBC WITH DIFFERENTIAL/PLATELET
Basophils Absolute: 0 10*3/uL (ref 0.0–0.1)
Basophils Relative: 0.4 % (ref 0.0–3.0)
Eosinophils Absolute: 0.4 10*3/uL (ref 0.0–0.7)
Lymphocytes Relative: 25.7 % (ref 12.0–46.0)
MCHC: 33.8 g/dL (ref 30.0–36.0)
Neutrophils Relative %: 56.3 % (ref 43.0–77.0)
RBC: 4.66 Mil/uL (ref 4.22–5.81)

## 2010-12-15 LAB — BASIC METABOLIC PANEL
CO2: 29 mEq/L (ref 19–32)
Calcium: 9.6 mg/dL (ref 8.4–10.5)
Creatinine, Ser: 0.9 mg/dL (ref 0.4–1.5)

## 2010-12-15 LAB — HEPATIC FUNCTION PANEL
Albumin: 3.9 g/dL (ref 3.5–5.2)
Alkaline Phosphatase: 44 U/L (ref 39–117)
Bilirubin, Direct: 0.1 mg/dL (ref 0.0–0.3)
Total Protein: 7 g/dL (ref 6.0–8.3)

## 2010-12-15 LAB — HEMOGLOBIN A1C: Hgb A1c MFr Bld: 6.5 % (ref 4.6–6.5)

## 2010-12-15 LAB — LIPID PANEL
Cholesterol: 162 mg/dL (ref 0–200)
HDL: 34.4 mg/dL — ABNORMAL LOW (ref >39.00)
Total CHOL/HDL Ratio: 5
Triglycerides: 535 mg/dL — ABNORMAL HIGH (ref 0.0–149.0)

## 2010-12-15 LAB — LDL CHOLESTEROL, DIRECT: Direct LDL: 81.9 mg/dL

## 2010-12-15 NOTE — Assessment & Plan Note (Addendum)
Controlled by home CBG measurement---check labs today

## 2010-12-15 NOTE — Progress Notes (Signed)
  Subjective:    Patient ID: [name], male    DOB: 1946-03-13, 65 y.o.   MRN: [id number]  HPI  Multiple medical complaints and chronic problem  Right hip pain  Left hand tingling  Chronic SOB  DM---CBGs in the 120-160 range  Combined CHF---requires o2---doing reasonably well  Past Medical History  Diagnosis Date  . DIABETES MELLITUS, TYPE II 06/24/2007  . HYPERLIPIDEMIA 06/24/2007  . HYPERTENSION 05/01/2007  . Hypoxemia 01/27/2010  . OBESITY 09/02/2008  . OSTEOARTHRITIS 05/01/2007  . 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 Appendectomy   . Wrist surgery     right  . Shoulder surgery     right  . Back surgery     reports that he has quit smoking. He does not have any smokeless tobacco history on file. His alcohol and drug histories not on file. family history includes Alcohol abuse in his father and Aneurysm in his mother.      Review of Systems   patient denies chest pain, shortness of breath, orthopnea. Denies lower extremity edema, abdominal pain, change in appetite, change in bowel movements. Patient denies rashes, musculoskeletal complaints. No other specific complaints in a complete review of systems.      Objective:   Physical Exam        Assessment & Plan:

## 2010-12-15 NOTE — Assessment & Plan Note (Signed)
Controlled, especially in light of morbid obesity

## 2010-12-15 NOTE — Assessment & Plan Note (Signed)
Chronic o2 use---tolerating well

## 2011-02-03 ENCOUNTER — Other Ambulatory Visit: Payer: Self-pay | Admitting: Internal Medicine

## 2011-02-13 LAB — DIFFERENTIAL
Basophils Absolute: 0 10*3/uL (ref 0.0–0.1)
Basophils Relative: 1 % (ref 0–1)
Eosinophils Absolute: 0.4 10*3/uL (ref 0.0–0.7)
Eosinophils Relative: 5 % (ref 0–5)
Monocytes Absolute: 1.1 10*3/uL — ABNORMAL HIGH (ref 0.1–1.0)
Monocytes Relative: 16 % — ABNORMAL HIGH (ref 3–12)

## 2011-02-13 LAB — CK TOTAL AND CKMB (NOT AT ARMC)
CK, MB: 4.3 ng/mL — ABNORMAL HIGH (ref 0.3–4.0)
Total CK: 513 U/L — ABNORMAL HIGH (ref 7–232)

## 2011-02-13 LAB — COMPREHENSIVE METABOLIC PANEL
ALT: 43 U/L (ref 0–53)
AST: 41 U/L — ABNORMAL HIGH (ref 0–37)
CO2: 26 mEq/L (ref 19–32)
Calcium: 8.7 mg/dL (ref 8.4–10.5)
Chloride: 95 mEq/L — ABNORMAL LOW (ref 96–112)
Creatinine, Ser: 1.34 mg/dL (ref 0.4–1.5)
GFR calc Af Amer: >60 mL/min (ref >60)
GFR calc non Af Amer: 54 mL/min — ABNORMAL LOW (ref >60)
Glucose, Bld: 186 mg/dL — ABNORMAL HIGH (ref 70–99)
Sodium: 131 mEq/L — ABNORMAL LOW (ref 135–145)
Total Bilirubin: 0.8 mg/dL (ref 0.3–1.2)

## 2011-02-13 LAB — CBC
Hemoglobin: 15.2 g/dL (ref 13.0–17.0)
Hemoglobin: 16 g/dL (ref 13.0–17.0)
MCHC: 33.1 g/dL (ref 30.0–36.0)
MCHC: 33.8 g/dL (ref 30.0–36.0)
MCV: 89.2 fL (ref 78.0–100.0)
MCV: 90.2 fL (ref 78.0–100.0)
RBC: 5.05 MIL/uL (ref 4.22–5.81)
RBC: 5.35 MIL/uL (ref 4.22–5.81)
RDW: 15.9 % — ABNORMAL HIGH (ref 11.5–15.5)
WBC: 6.6 10*3/uL (ref 4.0–10.5)

## 2011-02-13 LAB — CULTURE, BLOOD (ROUTINE X 2)
Culture: NO GROWTH
Culture: NO GROWTH

## 2011-02-13 LAB — POCT I-STAT, CHEM 8
Calcium, Ion: 1.16 mmol/L (ref 1.12–1.32)
Glucose, Bld: 139 mg/dL — ABNORMAL HIGH (ref 70–99)
HCT: 53 % — ABNORMAL HIGH (ref 39.0–52.0)
Hemoglobin: 18 g/dL — ABNORMAL HIGH (ref 13.0–17.0)
TCO2: 30 mmol/L (ref 0–100)

## 2011-02-13 LAB — LIPID PANEL
Cholesterol: 136 mg/dL (ref 0–200)
HDL: 26 mg/dL — ABNORMAL LOW (ref >39)
LDL Cholesterol: 60 mg/dL (ref 0–99)
Total CHOL/HDL Ratio: 5.2 RATIO
VLDL: 50 mg/dL — ABNORMAL HIGH (ref 0–40)

## 2011-02-13 LAB — HEMOGLOBIN A1C: Hgb A1c MFr Bld: 6.7 % — ABNORMAL HIGH (ref 4.6–6.1)

## 2011-02-13 LAB — POCT I-STAT 3, ART BLOOD GAS (G3+)
Acid-Base Excess: 2 mmol/L (ref 0.0–2.0)
Bicarbonate: 25.3 mEq/L — ABNORMAL HIGH (ref 20.0–24.0)
O2 Saturation: 97 %
Patient temperature: 98.1
TCO2: 26 mmol/L (ref 0–100)
pO2, Arterial: 85 mmHg (ref 80.0–100.0)

## 2011-02-13 LAB — GLUCOSE, CAPILLARY: Glucose-Capillary: 180 mg/dL — ABNORMAL HIGH (ref 70–99)

## 2011-02-13 LAB — TROPONIN I: Troponin I: 0.01 ng/mL (ref 0.00–0.06)

## 2011-02-13 LAB — POCT CARDIAC MARKERS
CKMB, poc: 2 ng/mL (ref 1.0–8.0)
Myoglobin, poc: 219 ng/mL (ref 12–200)
Troponin i, poc: <0.05 ng/mL (ref 0.00–0.09)

## 2011-02-13 LAB — PROTIME-INR: INR: 1 (ref 0.00–1.49)

## 2011-03-13 NOTE — H&P (Signed)
NAMECOLBIN, JOVEL NO.:  [id number]   MEDICAL RECORD NO.:  [id number]          PATIENT TYPE:  EMS   LOCATION:  MAJO                         FACILITY:  [facility]   PHYSICIAN:  Michiel Cowboy, MDDATE OF BIRTH:  November 23, 1945   DATE OF ADMISSION:  12/06/2008  DATE OF DISCHARGE:                              HISTORY & PHYSICAL   PRIMARY CARE PHYSICIAN:  Valetta Mole. Swords, M.D.   CHIEF COMPLAINT:  Shortness of breath.   HISTORY OF PRESENT ILLNESS:  The patient is a 65 year old gentleman with  history of COPD, diabetes and obesity who was at his baseline of health  up until 3 days ago when he started to have worsening shortness of  breath and wheezing and some cough.  Denies any fevers.  No chest pain.  Of note, he does endorse that for the past 3 months he had been sitting  up in a chair to sleep and feels more comfortable like that, although he  denies any underlying shortness of breath.  He does report that he had  been dealing for quite some time with nonhealing ulcers on his lower  extremities and currently wearing possibly an Radio broadcast assistant.  Otherwise, the  patient denies any nausea, vomiting, no diarrhea, no constipation.  He  seemed to be a fairly annoyed at any questions and answers very briefly.   Initially, the patient presented to the urgent care clinic with the  above-mentioned history of shortness of breath and was directly  transferred to the emergency department by ambulance.  He also endorses  some sinus problems that have moved into the chest and caused some  congestion.  He does endorse bilateral edema.  He says it has been like  this for quite some time.   REVIEW OF SYSTEMS:  Unremarkable except for his HPI to the best of my  abilities to obtain, as the patient is unwilling to provide a detailed  history.   PAST MEDICAL HISTORY:  1. Diabetes.  2. Hypertension.  3. Hypercholesteremia.  4. Chronic leg ulcers.   SOCIAL HISTORY:  The patient used  to smoke but quit in the 90s.  Lives  with his spouse.  Used to drink in the remote past, but has not been  drinking for about 10 years or so.   FAMILY HISTORY:  Noncontributory.   ALLERGIES:  NO KNOWN DRUG ALLERGIES.   MEDICATIONS:  1. Metformin 1000 mg p.o. twice a day.  2. Actos 45 mg p.o. once a day.  3. Lisinopril 40 mg p.o. once a day.  4. Etodolac 300 mg once a day.  5. Lipitor 80 mg once a day.  6. Hydrocodone acetaminophen 5/500 mg as needed,  7. Humalog sliding scale.  8. Lantus 100 units at bedtime.  9. Centrum Silver.  10.Aspirin 81 mg daily.  11.Potassium chloride 20 mEq daily.  12.Lasix 20 mg p.o. daily.   PHYSICAL EXAMINATION:  VITALS:  Temperature 99.1, blood pressure 128/79,  on repeat manual blood pressure 138/80, but to a cuff registers much  lower, likely secondary to disparity between patient's size and cuff  possibly.  Pulse on presentation 117, now down to 102, satting 86% on  room air, 94% on 2 liters.  HEENT:  Head nontraumatic.  Moist mucous membranes.  No JVD able to  appreciate secondary to severe obesity.  LUNGS:  Very distant breath sounds.  Mild wheezes occasionally noted.  HEART:  Rapid but regular.  No murmurs appreciated.  ABDOMEN:  Severely obese, nontender, nondistended.  EXTREMITIES:  Lower extremities with about 1+ edema bilaterally.  An  Unna boot on the right lower extremity and some changes consistent with  chronic venous stasis on the left.   LABORATORY DATA:  White blood cell count 7.1, hemoglobin 18.  Sodium  137, potassium 5.0, creatinine 1.29.  Chest x-ray shows cardiomegaly,  mild pulmonary congestion and bronchitis.  BNP less than 30.  First set  of cardiac markers - i-STAT unremarkable except for myoglobin slightly  elevated.  EKG showing sinus tachycardia, rate of 107.  There is an S  wave in lead I and II, and T-wave inversion in lead III.  There is Q-  wave in lead V1.  No poor lead progression.  This EKG is somewhat   changed from an EKG obtained in 1998, but overall does not appear to be  ischemic, just some nonspecific ST changes.  ABG with pH of 7.483, pCO2  of 33.7, pO2 of 86 on 2 liters.   ASSESSMENT/PLAN:  This is a 65 year old gentleman with the sudden onset  of shortness of breath for the past 3 days with a known history of  chronic obstructive pulmonary disease, but with history of tobacco abuse  in the past.  He comes in for further evaluation.  1. Shortness of breath.  Differential includes chronic obstructive      pulmonary disease exacerbation, given the known history of tobacco      abuse, but will also obtain D-dimer and cycle cardiac enzymes.  BNP      is unremarkable.  The patient does have chronic lower extremity      edema which is mild, likely partially related to his habitus.  Will      obtain a fasting lipid panel, check TSH.  We will treat this as      chronic obstructive pulmonary disease exacerbation with Atrovent,      albuterol.  While the patient had transient, infectious etiology is      not completely ruled out.  We will make sure he is on Avelox.  Will      give him prednisone for chronic obstructive pulmonary disease.  2. Transient hypertension.  Automatic blood pressure cuff not      confirmed by repeat readings.  Will hold lisinopril and check blood      pressure and vital signs frequently.  3. Slightly elevated creatinine.  Unsure what his baseline is.  We      will monitor .  4. Diabetes.  Continue sliding scale.  Continue Lantus.  Hold Actos      and metformin.  5. Cardiomegaly.  We will check a 2-D echo.  Will try to evaluate for      pulmonary hypertension.  6. Prophylaxis.  Protonix plus Lovenox.   Corky Crafts, MD will assume care in the a.m.      Michiel Cowboy, MD  Electronically Signed     AVD/MEDQ  D:  12/07/2008  T:  12/07/2008  Job:  [id number]   cc:   Valetta Mole. Swords, MD

## 2011-03-13 NOTE — Discharge Summary (Signed)
[name], [name]              ACCOUNT NO.:  [id number]   MEDICAL RECORD NO.:  [id number]          PATIENT TYPE:  INP   LOCATION:  3735                         FACILITY:  [facility]   PHYSICIAN:  Valerie A. Felicity Coyer, MDDATE OF BIRTH:  09-05-1946   DATE OF ADMISSION:  12/06/2008  DATE OF DISCHARGE:  12/08/2008                               DISCHARGE SUMMARY   DISCHARGE DIAGNOSES:  1. Dyspnea, multifactorial, but improved.  See details below.  2. Acute chronic obstructive pulmonary disease exacerbation.  Continue      empiric antibiotics with steroids, home nebulizers, and home O2.      See details below.  3. Acute sinusitis.  Continue antibiotic course for a total of 10 days      and outpatient followup.  4. Hypertension.  5. Dyslipidemia.  6. Type 2 diabetes, insulin resistant, on home insulin therapy and      sliding scale.  7. Obesity with history of obstructive sleep apnea and CPAP prior to      admission.  Outpatient followup as needed.  8. Chronic right lower extremity wound due to venous stasis ulcer,      ongoing home wound care as prior to admission with Home Health.  9. Abnormal EKG.  No acute events on telemetry or with cardiac enzymes      this hospitalization.  The patient declining further cardiac      evaluation.  Outpatient followup as needed.   DISCHARGE MEDICATIONS:  1. Avelox 400 mg daily x7 additional days to complete total 10-day      course.  2. Atrovent 0.5 mg nebulizer q.6 h. p.r.n. shortness of breath.  3. Prednisone 20 mg tablets, 2 tablets daily x3 days, then stop.  4. Over-the-counter Sudafed or other sinus medication per box      instructions as needed for nasal congestion.   OTHER MEDICATIONS:  As prior to admission and include:  1. Actos 45 mg tablets 1/2 tablet p.o. daily.  2. Lisinopril 40 mg daily.  3. Etodolac 300 mg p.o. daily.  4. Lipitor 80 mg daily.  5. Metformin 1000 mg b.i.d., next dose due a.m., December 09, 2008.      Hold until that  time, following 48-hour off contrast.  6. Hydrocodone and acetaminophen 5/500 q.4 h. p.r.n. pain.  7. Humalog sliding scale t.i.d. before meals.  8. Lantus 100 units at bedtime.  9. Aspirin 81 mg daily.  10.Potassium chloride 20 mEq daily.  11.Lasix 20 mg daily.  12.Centrum Silver 1 tablet daily.   The patient is also prescribed home oxygen via nasal cannula 2 L/minute,  continuous at all times until further followup or instruction per  primary care physician.   DISPOSITION:  The patient is discharged home.  He will resume Home  Health PT, OT as previously scheduled as well as Home Health nursing for  care of his right lower extremity venous stasis ulcer.  He is provided  home oxygen 2 L nasal cannula and home nebulizer machine prior to  discharge home.   CONDITION ON DISCHARGE:  Medically improved, hemodynamically stable, and  anxious for discharge home.  HOSPITAL COURSE:  1. Dyspnea.  The patient is a morbidly obese 65 year old gentleman      with multiple medical problems who presented to the emergency room      due to difficulty breathing.  He was seen by the emergency room and      felt to have a possible acute COPD exacerbation and admitted for      treatment of this with empiric antibiotics as well as further      cardiac evaluation for possible atypical symptoms.  The patient      refuses 2-D echo, as he reported that everything was fine with his      heart and his nose was the problem.  Upon further evaluation, the      patient has not experienced any change in his chronic dyspnea on      exertion prior to the onset of nasal congestion and has had no      increase in his lower extremity swelling.  CT angio was performed      to rule out PE in which no central origin could be identified and      no evidence of airspace disease or pulmonary edema could be      identified, as such we focused treatment on that of COPD with      antibiotics for acute sinusitis symptoms.  He  was treated with      Avelox and prednisone 60 mg daily.  He was also treated with p.r.n.      nebs which he reported having the most improvement with.  The      patient has been afebrile and his blood cultures have remained      negative x2 to date.  He is tolerating oral medications and it is      felt at this time he may be transitioned to home to continue a      total of 10-day antibiotic course with continued Avelox therapy and      we will provide supplemental home nebulizers and home oxygen to be      continued until the time of further followup for his underlying      respiratory issues.  Further cardiac workup will be on an as-needed      basis following the resolution of these acute issues and to be      directed by primary care physician as needed.  2. Other medical issues.  The patient's other chronic medical issues      are as previously listed.  No other medication changes have been      made in his regimen during the time of this hospitalization.  Due      to the contrast received during the CT angio, the patient will hold      his metformin for 48 hours and resume the next scheduled dose in      the a.m. on December 09, 2008.  This has been reviewed with him and      he understands plans for this.  Other medications for treatment of      his sinus and pulmonary issues as described and outpatient followup      with primary MD.  This is with Dr. Birdie Sons for Friday,      December 17, 2008 at 11:30 a.m.   TIME SPENT:  Greater than 30 minutes were spent on day of discharge for  the patient coordination.  See chart for full other details.  Valerie A. Felicity Coyer, MD  Electronically Signed     VAL/MEDQ  D:  12/08/2008  T:  12/08/2008  Job:  [id number]

## 2011-03-16 NOTE — Op Note (Signed)
[place]  Patient:    [name], JAGIELLO                       MRN: [id number] Proc. Date: 03/27/00 Adm. Date:  [id number] Disc. Date: [id number] Attending:  MeyerdierksEmelda Fear CC:         Dr. Urban Gibson                           Operative Report  PREOPERATIVE DIAGNOSIS:  Left carpal tunnel syndrome.  POSTOPERATIVE DIAGNOSIS:  Left carpal tunnel syndrome.  PROCEDURE:  Decompression of median nerve, left carpal tunnel.  SURGEON:  Lowell Bouton, M.D.  ANESTHESIA:  Half percent Marcaine local with sedation.  OPERATIVE FINDINGS:  The patient had a very narrow carpal canal with significant pressure on the nerve.  There were no masses present.  DESCRIPTION OF PROCEDURE:  Under 0.5% Marcaine local anesthesia with a tourniquet on the left arm, the left hand was prepped and draped in the usual fashion, and after exsanguinating the limb, the tourniquet was inflated to 250 mmHg.  A 3 cm longitudinal incision was made in the palm just ulnar to the thenar crease.  Blunt dissection was carried through the subcutaneous tissues and bleeding points were coagulated.  Blunt dissection was carried down to the median nerve distal to the transverse carpal ligament through the superficial palmar fascia.  A hemostat was placed in the carpal canal up against the hook of the hamate, and the transverse carpal ligament was divided on the ulnar border of the median nerve.  The proximal end of the ligament was divided with the scissors after dissecting the nerve away from the under surface of the ligament.  The carpal canal was then palpated and was found to be adequately decompressed.  No masses were identified.  The nerve was examined and was found to be intact.  The wound was irrigated with saline.  The skin was closed with 4-0 nylon sutures.  Sterile dressings were applied followed by a volar wrist splint.  The patient tolerated the procedure well and went  to the recovery room awake, stable, and in good condition. DD:  03/27/00 TD:  04/01/00 Job: [id number]

## 2011-03-16 NOTE — Op Note (Signed)
[place]  Patient:    [name], [name]                       MRN: [id number] Proc. Date: 06/26/00 Adm. 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  PROCEDURE:  Right T12-L1 laminotomy and microdiskectomy.  SURGEON:  Julio Sicks, M.D.  ASSISTANT:  Donzetta Sprung. Roney Jaffe., MD  ANESTHESIA:  General endotracheal.  INDICATIONS:  Mr. Commons is a 65 year old male with history of a right lower extremity weakness involving his hip flexors, as well as, some pain and paresthesias.  Patient has failed efforts at conservative management.  MRI scanning demonstrated a very large right-sided T12-L1 disk herniation with compression of the thecal sac and conus medullaris.  Patient has been counselled as to his options.  He has decided to proceed with a lumbar laminotomy and microdiskectomy for hopeful relief of his symptoms.  OPERATIVE NOTE:  Patient taken to the operating room, placed on the table in a supine position.  After adequate level of anesthesia achieved, patient was positioned prone onto a Wilson frame, appropriately padded.  Patients lumbar region was shaved and prepped sterilely.  A #10-blade was  used to make a linear skin incision overlying the T12-l1 level.  This was carried down sharply in the midline.  A subperiosteal dissection was then performed on the right side exposing the lamina and facet joints of T12-L1.  A deep self-retaining retractor was placed.  Intraoperative x-ray was taken.  The level was confirmed.  A laminotomy was then performed using Kerrison rongeurs and high-speed drill to remove the inferior one-half of the lamina of T12 and the medial half of the T12-L1 facet joint and the superior rim of the L1 lamina.   Ligamentum flavum was then elevated and resected in a piecemeal fashion using Kerrison rongeurs.  The underlying thecal sac and exiting L1 nerve root was identified.  Microscope was brought into the field and used throughout the remainder of the diskectomy for microdissection of the thecal sac and underlying disk herniation.  Epidural venous plexus coagulated and cut.  The thecal sac was gently mobilized a few mm exposing the disk herniation.  The disk herniation was then incised with a 15-blade in a rectangular fashion.  A wide disk space cleanout was then achieved using pituitary rongeurs, forward-angled and Epstein curets.  All loose degenerative disk material was removed from the interspace.  All elements of the disk herniation were completely removed.  At this point, a very thorough decompression then performed.  There was no evidence of injury to thecal sac or nerve roots.  Retraction required during the procedure was minimal.  The wound was copiously irrigated with antibiotic solution.  Gelfoam was placed topically for hemostasis, which was found to be good.  Microscope and retractor system were removed.  Hemostasis in the muscle was achieved with electrocautery.  The wound was then closed in layers with Vicryl sutures. Staples were applied to the surface.  There were no apparent complications. The patient tolerated the procedure well and he returns to recovery room postoperatively. DD:  06/26/00 TD:  06/26/00 Job: [id number]

## 2011-03-16 NOTE — Op Note (Signed)
NAME:  [name], [name]                        ACCOUNT NO.:  [id number]   MEDICAL RECORD NO.:  [id number]                   PATIENT TYPE:  OBV   LOCATION:  0347                                 FACILITY:  [facility]   PHYSICIAN:  Claudette Laws, M.D.               DATE OF BIRTH:  [birth year]   DATE OF PROCEDURE:  10/07/2003  DATE OF DISCHARGE:                                 OPERATIVE REPORT   PREOPERATIVE DIAGNOSIS:  Left distal ureteral calculus.   POSTOPERATIVE DIAGNOSIS:  Left distal ureteral calculus.   PROCEDURE:  Cystoscopy, left ureteroscopy, left double J ureteral stent  placement.   SURGEON:  Claudette Laws, M.D.   ASSISTANT:  Susanne Borders, MD   ANESTHESIA:  General endotracheal.   COMPLICATIONS:  None.   DRAINS:  1. 6 French x26 cm double J ureteral stent without tether.  2. 16 French Foley catheter to straight drain.   INDICATIONS FOR PROCEDURE:  [name] is a 65 year old male who was  recently evaluated for left flank pain with a CT scan with stone protocol.  He was found to have a 13 mm stone at the level of his sacrum.  He has been  unable to pass the stone and has been on narcotic pain medication.  He  wished to have the stone treated.  He consented to ureteroscopic stone  extraction after understanding the risks, benefits, and alternatives.   DESCRIPTION OF PROCEDURE:  The patient was brought to the operating room and  correctly identified by his identification bracelet. He was given  preoperative antibiotics and general endotracheal anesthesia.  He was placed  in the dorsal lithotomy position and prepped and draped in typical sterile  fashion.  Cystoscopy was performed with a 22 French cystoscope with a 12  degree lens.  The patient had marked median lobe hypertrophy and it was  actually difficult to get the cystoscope in the patient's bladder.  Within  the bladder, bilateral ureteral orifices were identified in their normal  anatomic location.  The patient  had moderate trabeculation throughout his  bladder.  There were no mucosal abnormalities, foreign bodies, or stones  within the bladder.  The left ureteral orifice is cannulated with a 6 French  end hole ureteral stent.  A left retrograde pyelogram was performed by  injecting approximately 6 mL of contrast into the patient's left collecting  system. An attempt was made to pass a Glidewire through the stent and a  small submucosal tunnel was created just inside the left ureteral orifice  exiting approximately 1 cm from the ureteral orifice within the bladder.  This false passage made it difficult to pass the guidewire any further.  The  cystoscope was removed and a 6 French ureteroscope was placed within the  bladder.  Using the ureteroscope, a guidewire was easily passed up into the  left renal collecting system and was visualized under fluoroscopy.  The  ureteroscope  was backed out over the wire and replaced along side the wire  within the ureter.  The scope was passed up the level of the stone and the  stone was indeed quite large approximately 13 mm in its largest diameter.  The 365 micron laser fiber was used with settings of 6 joules and 5 Hz.  The  stone was fragmented into several small pieces that were no larger than 2 mm  in size.  We decided not to attempt to pull all the small pieces out of the  ureter as the patient had already had some ureteral trauma at his ureteral  orifice.  The ureteroscope was removed and the cystoscope was back loaded  over the guidewire.  The stent was then passed over the guidewire with a  good curl seen in the left renal pelvis as visualized under fluoroscopy and  in the bladder visualized directly.  The cystoscope was removed.  A 16  French Foley catheter was placed.  The patient was awakened from his  anesthesia without complications and taken to post anesthesia care unit in  stable condition. He tolerated the procedure well.  Please note that Dr.   Etta Grandchild was present and participated in all aspects of this case as he is the  primary surgeon.     Susanne Borders, MD                           Claudette Laws, M.D.    DR/MEDQ  D:  10/07/2003  T:  10/07/2003  Job:  [id number]

## 2011-03-17 ENCOUNTER — Other Ambulatory Visit: Payer: Self-pay | Admitting: Internal Medicine

## 2011-04-18 ENCOUNTER — Ambulatory Visit: Payer: PRIVATE HEALTH INSURANCE | Admitting: Internal Medicine

## 2011-04-19 ENCOUNTER — Encounter: Payer: Self-pay | Admitting: Internal Medicine

## 2011-04-19 ENCOUNTER — Ambulatory Visit (INDEPENDENT_AMBULATORY_CARE_PROVIDER_SITE_OTHER): Payer: PRIVATE HEALTH INSURANCE | Admitting: Internal Medicine

## 2011-04-19 DIAGNOSIS — E785 Hyperlipidemia, unspecified: Secondary | ICD-10-CM

## 2011-04-19 DIAGNOSIS — E119 Type 2 diabetes mellitus without complications: Secondary | ICD-10-CM

## 2011-04-19 DIAGNOSIS — I10 Essential (primary) hypertension: Secondary | ICD-10-CM

## 2011-04-19 DIAGNOSIS — M1990 Unspecified osteoarthritis, unspecified site: Secondary | ICD-10-CM

## 2011-04-19 DIAGNOSIS — I5040 Unspecified combined systolic (congestive) and diastolic (congestive) heart failure: Secondary | ICD-10-CM

## 2011-04-19 LAB — BASIC METABOLIC PANEL
CO2: 32 mEq/L (ref 19–32)
Chloride: 100 mEq/L (ref 96–112)
Glucose, Bld: 128 mg/dL — ABNORMAL HIGH (ref 70–99)
Sodium: 137 mEq/L (ref 135–145)

## 2011-04-19 LAB — LIPID PANEL: Total CHOL/HDL Ratio: 5

## 2011-04-19 LAB — HEPATIC FUNCTION PANEL
ALT: 65 U/L — ABNORMAL HIGH (ref 0–53)
AST: 58 U/L — ABNORMAL HIGH (ref 0–37)
Bilirubin, Direct: 0.1 mg/dL (ref 0.0–0.3)
Total Bilirubin: 0.7 mg/dL (ref 0.3–1.2)
Total Protein: 7.1 g/dL (ref 6.0–8.3)

## 2011-04-19 LAB — LDL CHOLESTEROL, DIRECT: Direct LDL: 84.4 mg/dL

## 2011-04-19 MED ORDER — TRIAMCINOLONE ACETONIDE 0.5 % EX CREA: 1.0000 "application " | TOPICAL_CREAM | Freq: Two times a day (BID) | CUTANEOUS | Status: DC

## 2011-04-19 MED ORDER — HYDROCODONE-ACETAMINOPHEN 5-500 MG PO TABS: 1.0000 | ORAL_TABLET | Freq: Two times a day (BID) | ORAL | Status: DC | PRN

## 2011-04-19 NOTE — Assessment & Plan Note (Signed)
Not as well controlled as I would like. previoius Bps have been adequate Continue same meds for no

## 2011-04-19 NOTE — Assessment & Plan Note (Signed)
Uses chronic, rare vicodin

## 2011-04-19 NOTE — Assessment & Plan Note (Signed)
Chronic O2---he is feeling well Intermittent, minimal LE edema

## 2011-04-19 NOTE — Assessment & Plan Note (Signed)
Previously controlled Check labs 

## 2011-04-19 NOTE — Progress Notes (Signed)
  Subjective:    Patient ID: [name], male    DOB: [birth year], 65 y.o.   MRN: [id number]  HPI   patient comes in for followup of multiple medical problems including type 2 diabetes, hyperlipidemia, hypertension. The patient does not check blood sugar or blood pressure at home. The patetient does not follow an exercise or diet program. The patient denies any polyuria, polydipsia.  In the past the patient has gone to diabetic treatment center. The patient is tolerating medications  Without difficulty. The patient does admit to medication compliance.   Past Medical History  Diagnosis Date  . DIABETES MELLITUS, TYPE II 06/24/2007  . HYPERLIPIDEMIA 06/24/2007  . HYPERTENSION 05/01/2007  . Hypoxemia 01/27/2010  . OBESITY 09/02/2008  . OSTEOARTHRITIS 05/01/2007  . 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 Appendectomy   . Wrist surgery     right  . Shoulder surgery     right  . Back surgery     reports that he has quit smoking. He does not have any smokeless tobacco history on file. His alcohol and drug histories not on file. family history includes Alcohol abuse in his father and Aneurysm in his mother. No Known Allergies   Review of Systems  patient denies chest pain, shortness of breath, orthopnea. Denies lower extremity edema, abdominal pain, change in appetite, change in bowel movements. Patient denies rashes, musculoskeletal complaints. No other specific complaints in a complete review of systems.      Objective:   Physical Exam  well-developed well-nourished male in no acute distress. HEENT exam atraumatic, normocephalic, neck supple without jugular venous distention. Chest clear to auscultation cardiac exam S1-S2 are regular. Abdominal exam overweight with bowel sounds, soft and nontender. Extremities no edema. Neurologic exam is alert with a normal gait.        Assessment & Plan:

## 2011-04-19 NOTE — Assessment & Plan Note (Signed)
Check labs today Encouraged aggressive weight loss

## 2011-04-24 ENCOUNTER — Telehealth: Payer: Self-pay

## 2011-04-24 NOTE — Telephone Encounter (Signed)
Pt.notified

## 2011-04-24 NOTE — Telephone Encounter (Signed)
Message copied by Beverely Low on Tue Apr 24, 2011  4:29 PM ------      Message from: Lindley Magnus      Created: Mon Apr 23, 2011  7:48 PM       Call patient labs are ok

## 2011-04-28 ENCOUNTER — Other Ambulatory Visit: Payer: Self-pay | Admitting: Internal Medicine

## 2011-06-16 ENCOUNTER — Other Ambulatory Visit: Payer: Self-pay | Admitting: Internal Medicine

## 2011-06-27 ENCOUNTER — Other Ambulatory Visit: Payer: Self-pay | Admitting: Internal Medicine

## 2011-07-06 ENCOUNTER — Encounter: Payer: Self-pay | Admitting: Internal Medicine

## 2011-07-06 ENCOUNTER — Ambulatory Visit (INDEPENDENT_AMBULATORY_CARE_PROVIDER_SITE_OTHER): Payer: PRIVATE HEALTH INSURANCE | Admitting: Internal Medicine

## 2011-07-06 DIAGNOSIS — E785 Hyperlipidemia, unspecified: Secondary | ICD-10-CM

## 2011-07-06 DIAGNOSIS — E119 Type 2 diabetes mellitus without complications: Secondary | ICD-10-CM

## 2011-07-06 DIAGNOSIS — I10 Essential (primary) hypertension: Secondary | ICD-10-CM

## 2011-07-06 DIAGNOSIS — Z23 Encounter for immunization: Secondary | ICD-10-CM

## 2011-07-06 NOTE — Assessment & Plan Note (Addendum)
Fair control Continue meds 

## 2011-07-06 NOTE — Progress Notes (Signed)
  Subjective:    Patient ID: [name], male    DOB: 27-Sep-1946, 65 y.o.   MRN: [id number]  HPI   patient comes in for followup of multiple medical problems including type 2 diabetes, hyperlipidemia, hypertension. The patient does not check blood sugar or blood pressure at home. The patetient does not follow an exercise or diet program. The patient denies any polyuria, polydipsia.  In the past the patient has gone to diabetic treatment center. The patient is tolerating medications  Without difficulty. The patient does admit to medication compliance.   Past Medical History  Diagnosis Date  . DIABETES MELLITUS, TYPE II 06/24/2007  . HYPERLIPIDEMIA 06/24/2007  . HYPERTENSION 05/01/2007  . Hypoxemia 01/27/2010  . OBESITY 09/02/2008  . OSTEOARTHRITIS 05/01/2007  . 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 Appendectomy   . Wrist surgery     right  . Shoulder surgery     right  . Back surgery     reports that he has quit smoking. He does not have any smokeless tobacco history on file. His alcohol and drug histories not on file. family history includes Alcohol abuse in his father and Aneurysm in his mother. No Known Allergies   Review of Systems  patient denies chest pain, shortness of breath, orthopnea. Denies lower extremity edema, abdominal pain, change in appetite, change in bowel movements. Patient denies rashes, musculoskeletal complaints. No other specific complaints in a complete review of systems.      Objective:   Physical Exam   well-developed well-nourished male in no acute distress. HEENT exam atraumatic, normocephalic, neck supple without jugular venous distention. Chest clear to auscultation cardiac exam S1-S2 are regular. Abdominal exam overweight with bowel sounds, soft and nontender. Extremities no edema. Neurologic exam is alert with a normal gait.       Assessment & Plan:

## 2011-07-06 NOTE — Assessment & Plan Note (Addendum)
No need for follow up

## 2011-07-06 NOTE — Assessment & Plan Note (Addendum)
Lab Results  Component Value Date   HGBA1C 7.1* 04/19/2011    Previously controlled Will check labs next visit

## 2011-07-07 ENCOUNTER — Other Ambulatory Visit: Payer: Self-pay | Admitting: Internal Medicine

## 2011-07-18 ENCOUNTER — Telehealth: Payer: Self-pay | Admitting: Internal Medicine

## 2011-07-18 NOTE — Telephone Encounter (Signed)
Pt is calling stating he is out of Insulin, and the pharmacy tells him we are not responding x 3 days??

## 2011-07-18 NOTE — Telephone Encounter (Signed)
PT tried to get prescription refilled for his HUMULIN N 100 UNIT/ML injection. Pt was informed that they no longer make it and that he would need to contact his pcp and get a new prescription of Nocolin N sent into the pharmacy.  [facility]

## 2011-07-19 MED ORDER — INSULIN NPH (HUMAN) (ISOPHANE) 100 UNIT/ML ~~LOC~~ SUSP: 70.0000 [IU] | Freq: Two times a day (BID) | SUBCUTANEOUS | Status: DC

## 2011-07-19 NOTE — Telephone Encounter (Signed)
Med changed, sent in electronically

## 2011-07-19 NOTE — Telephone Encounter (Signed)
Refill it

## 2011-07-23 ENCOUNTER — Telehealth: Payer: Self-pay | Admitting: *Deleted

## 2011-07-23 NOTE — Telephone Encounter (Signed)
Pt states he told Dr. Cato Mulligan on his last office visit that he wanted a colonoscopy due to abdomen pain, and has not heard anything back from [place]???

## 2011-07-23 NOTE — Telephone Encounter (Signed)
Refer to GI for evaluation of abdominal pain

## 2011-08-06 ENCOUNTER — Other Ambulatory Visit: Payer: Self-pay | Admitting: Internal Medicine

## 2011-08-16 ENCOUNTER — Encounter: Payer: Self-pay | Admitting: Internal Medicine

## 2011-08-21 ENCOUNTER — Encounter: Payer: Self-pay | Admitting: Internal Medicine

## 2011-08-21 ENCOUNTER — Ambulatory Visit (INDEPENDENT_AMBULATORY_CARE_PROVIDER_SITE_OTHER): Payer: PRIVATE HEALTH INSURANCE | Admitting: Internal Medicine

## 2011-08-21 DIAGNOSIS — Z1211 Encounter for screening for malignant neoplasm of colon: Secondary | ICD-10-CM

## 2011-08-21 DIAGNOSIS — R198 Other specified symptoms and signs involving the digestive system and abdomen: Secondary | ICD-10-CM

## 2011-08-21 DIAGNOSIS — R194 Change in bowel habit: Secondary | ICD-10-CM

## 2011-08-21 MED ORDER — PEG-KCL-NACL-NASULF-NA ASC-C 100 G PO SOLR: 1.0000 | Freq: Once | ORAL | Status: DC

## 2011-08-21 NOTE — Patient Instructions (Signed)
You have been scheduled for a colonoscopy. Please follow written instructions given to you at your visit today.  Please pick up your prep kit at the pharmacy within the next 2-3 days. 

## 2011-08-21 NOTE — Progress Notes (Signed)
Subjective:    Patient ID: [name], male    DOB: [birth year], 65 y.o.   MRN: [id number]  HPI Mr. Limbach is a 65 yo male with PMH of hypertension, hyperlipidemia, diabetes, COPD, and obesity who seen in consultation at the request of Dr. Cato Mulligan for evaluation of change in bowel habits.  Mr. Cart is accompanied today by his wife. The patient states that over the last 6 months he's had a change in his bowel habits. He noted prior to 6 months ago he had one formed bowel movement daily. Over the last 6 months he's become irregular having 2-3 loose stools on some days, but then going one to 3 days without bowel movements on other days. For him this is a big change. He denies bright red blood per rectum or melena. He does endorse borborygmi. He denies nausea and vomiting. He denies abdominal pain. He reports his appetite is normal, but he has lost about 40 pounds in the last 6-12 months. He reports he feels that this is mostly intentional as he is attempted to change his diet to improve the control of his diabetes. His wife questions if this is all intentional. He denies a change in medications recently and notes that his current medications are all long-standing for him. He's had no fevers chills or night sweats. He reports occasional heartburn, not reports this is a rare problem for him. He denies dysphagia or odynophagia. No early satiety.  He feels he had a flexible sigmoidoscopy about 25 years ago, she remembers to be normal.   Review of Systems Constitutional: Negative for fever, chills, night sweats, activity change, appetite change and unexpected weight change HEENT: Negative for sore throat, mouth sores and trouble swallowing. Eyes: Negative for visual disturbance Respiratory: Negative for cough, chest tightness. Positive for chronic dyspnea Cardiovascular: Negative for chest pain, palpitations, positive for chronic lower extremity edema Gastrointestinal: See history of present  illness Genitourinary: Negative for dysuria and hematuria. Musculoskeletal: Positive for back pain and arthritis (chronic), no myalgias. He does note occasional muscle cramping. Skin: Negative for rash or color change Neurological: Negative for headaches, weakness, numbness Hematological: Negative for adenopathy, negative for easy bruising/bleeding Psychiatric/behavioral: Negative for depressed mood, negative for anxiety   Patient Active Problem List  Diagnoses  . DIABETES MELLITUS, TYPE II  . HYPERLIPIDEMIA  . OBESITY  . HYPERTENSION  . UNSPEC COMBINED SYSTOLIC&DIASTOLIC HEART FAILURE  . OSTEOARTHRITIS  . HYPOXEMIA   Current Outpatient Prescriptions  Medication Sig Dispense Refill  . aspirin 81 MG tablet Take 81 mg by mouth daily.        . Insulin Isophane Human (HUMULIN N [place]) Inject into the skin. Injects 32 units three times a day       . insulin NPH (NOVOLIN N) 100 UNIT/ML injection Inject 70 Units into the skin 2 (two) times daily.  50 mL  5  . lisinopril (PRINIVIL,ZESTRIL) 40 MG tablet TAKE ONE TABLET BY MOUTH EVERY DAY  90 tablet  1  . metFORMIN (GLUCOPHAGE) 1000 MG tablet TAKE ONE TABLET BY MOUTH TWICE DAILY  120 tablet  3  . Multiple Vitamin (MULTIVITAMIN) tablet Take 1 tablet by mouth daily.        . simvastatin (ZOCOR) 80 MG tablet TAKE ONE TABLET BY MOUTH AT BEDTIME  90 tablet  1  . torsemide (DEMADEX) 20 MG tablet TAKE ONE TABLET BY MOUTH EVERY DAY  90 tablet  1  . peg 3350 powder (MOVIPREP) 100 G SOLR Take 1 kit (  100 g total) by mouth once.  1 kit  0   No Known Allergies  Family History  Problem Relation Age of Onset  . Aneurysm Mother   . Alcohol abuse Father   . Colon cancer Neg Hx   --he notes his mother had a history of diverticulitis requiring colon resection  Social History  . Marital Status: Married   Occupational History  . Retired    Social History Main Topics  . Smoking status: Former Games developer  . Smokeless tobacco: Current User  . Alcohol Use: No   . Drug Use: No   Social History Narrative   Coffee daily       Objective:   Physical Exam BP 132/60  Pulse 107  Ht 5\' 9"  (1.753 m)  Wt 332 lb (150.594 kg)  BMI 49.03 kg/m2  SpO2 99% Constitutional: Well-developed and well-nourished. No distress. HEENT: Normocephalic and atraumatic. Oropharynx is clear and moist. No oropharyngeal exudate. Conjunctivae are normal. Pupils are equal round and reactive to light. No scleral icterus. Oxygen via nasal cannula Neck: Neck supple. Trachea midline. Cardiovascular: Normal rate, regular rhythm and intact distal pulses. No M/R/G Pulmonary/chest: Effort normal and breath sounds normal. No wheezing, rales or rhonchi. Breath sounds somewhat distant Abdominal: Soft, obese, nontender, nondistended. Bowel sounds active throughout. There are no masses palpable. No hepatosplenomegaly. Extremities: 1+ LE edema, compression stockings bilateral Lymphadenopathy: No cervical adenopathy noted. Neurological: Alert and oriented to person place and time. Skin: Skin is warm and dry. No rashes noted. Psychiatric: Normal mood and affect. Behavior is normal.     Assessment & Plan:  65 yo male with PMH of hypertension, hyperlipidemia, diabetes, COPD, and obesity who seen in consultation at the request of Dr. Cato Mulligan for evaluation of change in bowel habits..  1. Change in bowel habits -- given the patient's change in bowel habits and affect he has never had colorectal cancer screening, we will proceed with colonoscopy. He is agreeable to this test and we have discussed the risk and benefits today. I'm hesitant to prescribe an antidiarrheal given that there are days when he is not having bowel movements, and it is possible for him constipated using this medication. We will proceed with colonoscopy and then further workup can be done if necessary after this test.

## 2011-08-30 HISTORY — PX: COLONOSCOPY W/ POLYPECTOMY: SHX1380

## 2011-08-31 ENCOUNTER — Ambulatory Visit (INDEPENDENT_AMBULATORY_CARE_PROVIDER_SITE_OTHER): Payer: Medicare Other | Admitting: Internal Medicine

## 2011-08-31 ENCOUNTER — Encounter: Payer: Self-pay | Admitting: Internal Medicine

## 2011-08-31 VITALS — BP 162/82 | HR 120 | Temp 98.8°F | Wt 332.0 lb

## 2011-08-31 DIAGNOSIS — M25519 Pain in unspecified shoulder: Secondary | ICD-10-CM

## 2011-08-31 MED ORDER — OXYCODONE-ACETAMINOPHEN 10-325 MG PO TABS: 1.0000 | ORAL_TABLET | Freq: Four times a day (QID) | ORAL | Status: DC | PRN
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
## 2011-09-08 MED ORDER — METHYLPREDNISOLONE ACETATE 80 MG/ML IJ SUSP: 40.0000 mg | Freq: Once | INTRAMUSCULAR | Status: DC

## 2011-09-10 ENCOUNTER — Telehealth: Payer: Self-pay | Admitting: *Deleted

## 2011-09-10 NOTE — Telephone Encounter (Signed)
Pharmacy sent a fax requesting a new rx for depo medrol inject 0.5 mL in the articular space once, called pt and he does not why [facility] sent this. Dr Cato Mulligan did this in the office.  Faxed request back to pharmacy denying the request

## 2011-09-25 ENCOUNTER — Ambulatory Visit (AMBULATORY_SURGERY_CENTER): Payer: Medicare Other | Admitting: Internal Medicine

## 2011-09-25 ENCOUNTER — Encounter: Payer: Self-pay | Admitting: Internal Medicine

## 2011-09-25 DIAGNOSIS — D126 Benign neoplasm of colon, unspecified: Secondary | ICD-10-CM

## 2011-09-25 DIAGNOSIS — Z1211 Encounter for screening for malignant neoplasm of colon: Secondary | ICD-10-CM

## 2011-09-25 DIAGNOSIS — R198 Other specified symptoms and signs involving the digestive system and abdomen: Secondary | ICD-10-CM

## 2011-09-25 LAB — HM COLONOSCOPY

## 2011-09-25 LAB — GLUCOSE, CAPILLARY: Glucose-Capillary: 140 mg/dL — ABNORMAL HIGH (ref 70–99)

## 2011-09-25 MED ORDER — SODIUM CHLORIDE 0.9 % IV SOLN: 500.0000 mL | INTRAVENOUS | Status: DC

## 2011-09-25 NOTE — Progress Notes (Signed)
Patient did not experience any of the following events: a burn prior to discharge; a fall within the facility; wrong site/side/patient/procedure/implant event; or a hospital transfer or hospital admission upon discharge from the facility. (G8907) Patient did not have preoperative order for IV antibiotic SSI prophylaxis. (G8918)  

## 2011-09-25 NOTE — Patient Instructions (Signed)
Handouts given on polyps, diverticulosis, high fiber diet, hemorrhoids Hold all aspirin and aspirin products x 2 weeks. Can use tylenol only until 10-09-11. You will receive a letter from [place] in 1-2 weeks with pathology results and dr pyrtles recommendations. Discharge instructions per blue and green sheets

## 2011-09-26 ENCOUNTER — Telehealth: Payer: Self-pay | Admitting: *Deleted

## 2011-09-26 NOTE — Telephone Encounter (Signed)

## 2011-10-02 ENCOUNTER — Encounter: Payer: Self-pay | Admitting: Internal Medicine

## 2011-10-13 ENCOUNTER — Other Ambulatory Visit: Payer: Self-pay | Admitting: Internal Medicine

## 2011-11-29 ENCOUNTER — Telehealth: Payer: Self-pay | Admitting: Internal Medicine

## 2011-11-29 MED ORDER — HYDROCODONE-ACETAMINOPHEN 5-500 MG PO TABS: 1.0000 | ORAL_TABLET | Freq: Two times a day (BID) | ORAL | Status: DC

## 2011-11-29 NOTE — Telephone Encounter (Signed)
rx called in

## 2011-11-29 NOTE — Telephone Encounter (Signed)
Pt called req refill of HYDROcodone-acetaminophen (VICODIN) 5-500 MG per tablet to be called in to [place] on [address].

## 2011-12-05 ENCOUNTER — Ambulatory Visit (INDEPENDENT_AMBULATORY_CARE_PROVIDER_SITE_OTHER): Payer: Medicare Other | Admitting: Internal Medicine

## 2011-12-05 ENCOUNTER — Encounter: Payer: Self-pay | Admitting: Internal Medicine

## 2011-12-05 DIAGNOSIS — I10 Essential (primary) hypertension: Secondary | ICD-10-CM

## 2011-12-05 DIAGNOSIS — H6120 Impacted cerumen, unspecified ear: Secondary | ICD-10-CM

## 2011-12-05 DIAGNOSIS — E119 Type 2 diabetes mellitus without complications: Secondary | ICD-10-CM

## 2011-12-05 NOTE — Progress Notes (Signed)
  Subjective:    Patient ID: [name], male    DOB: 1946/06/08, 66 y.o.   MRN: [id number]  HPI  66 year old patient who has a history of insulin requiring diabetes mellitus. His last single A1c was in June of last year. He has treated hypertension. He presents today with a chief complaint of diminished artery acuity from the right ear. He has required irrigation of the canal in the past   Review of Systems  HENT: Positive for hearing loss.        Objective:   Physical Exam  Constitutional:       Obese Blood pressure 130/62 Nasal oxygen in place  HENT:       Cerumen impaction in the right canal on          Assessment & Plan:   Cerumen impaction AD. Canal irrigated until clear Diabetes mellitus. We'll check a hemoglobin A1c;   followup with PCP in 3 months

## 2011-12-05 NOTE — Patient Instructions (Signed)
Limit your sodium (Salt) intake   Please check your hemoglobin A1c every 3 months   

## 2012-01-03 ENCOUNTER — Ambulatory Visit (INDEPENDENT_AMBULATORY_CARE_PROVIDER_SITE_OTHER): Payer: Medicare Other | Admitting: Internal Medicine

## 2012-01-03 ENCOUNTER — Encounter: Payer: Self-pay | Admitting: Internal Medicine

## 2012-01-03 ENCOUNTER — Telehealth: Payer: Self-pay | Admitting: *Deleted

## 2012-01-03 VITALS — BP 122/64 | HR 105 | Temp 98.2°F | Wt 329.0 lb

## 2012-01-03 DIAGNOSIS — E119 Type 2 diabetes mellitus without complications: Secondary | ICD-10-CM

## 2012-01-03 DIAGNOSIS — I10 Essential (primary) hypertension: Secondary | ICD-10-CM

## 2012-01-03 DIAGNOSIS — E785 Hyperlipidemia, unspecified: Secondary | ICD-10-CM

## 2012-01-03 LAB — LIPID PANEL
Cholesterol: 145 mg/dL (ref 0–200)
HDL: 41.5 mg/dL (ref >39.00)
Triglycerides: 351 mg/dL — ABNORMAL HIGH (ref 0.0–149.0)

## 2012-01-03 LAB — HEPATIC FUNCTION PANEL
Albumin: 4.1 g/dL (ref 3.5–5.2)
Alkaline Phosphatase: 39 U/L (ref 39–117)
Bilirubin, Direct: 0 mg/dL (ref 0.0–0.3)

## 2012-01-03 LAB — BASIC METABOLIC PANEL
Calcium: 9.8 mg/dL (ref 8.4–10.5)
GFR: 93.16 mL/min (ref >60.00)
Glucose, Bld: 103 mg/dL — ABNORMAL HIGH (ref 70–99)
Sodium: 139 mEq/L (ref 135–145)

## 2012-01-03 LAB — LDL CHOLESTEROL, DIRECT: Direct LDL: 68.6 mg/dL

## 2012-01-03 MED ORDER — LISINOPRIL 40 MG PO TABS: 40.0000 mg | ORAL_TABLET | Freq: Every day | ORAL | Status: DC

## 2012-01-03 MED ORDER — CLOTRIMAZOLE-BETAMETHASONE 1-0.05 % EX CREA: TOPICAL_CREAM | Freq: Two times a day (BID) | CUTANEOUS | Status: DC

## 2012-01-03 NOTE — Assessment & Plan Note (Signed)
Well controlled. Continue current meds

## 2012-01-03 NOTE — Telephone Encounter (Signed)
Called pt and told him to tell insurance company it is Zostavax

## 2012-01-03 NOTE — Progress Notes (Signed)
patient comes in for followup of multiple medical problems including type 2 diabetes, hyperlipidemia, hypertension. The patient does not check blood sugar or blood pressure at home. The patetient does not follow an exercise or diet program. The patient denies any polyuria, polydipsia.  In the past the patient has gone to diabetic treatment center. The patient is tolerating medications  Without difficulty. The patient does admit to medication compliance.   Past Medical History  Diagnosis Date  . DIABETES MELLITUS, TYPE II 06/24/2007  . HYPERLIPIDEMIA 06/24/2007  . HYPERTENSION 05/01/2007  . Hypoxemia 01/27/2010  . OBESITY 09/02/2008  . OSTEOARTHRITIS 05/01/2007  . 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 OSA (obstructive sleep apnea)   . COPD (chronic obstructive pulmonary disease)   . Asthma     as a child  . Cataract     History   Social History  . Marital Status: Married    Spouse Name: N/A    Number of Children: N/A  . Years of Education: N/A   Occupational History  . Retired    Social History Main Topics  . Smoking status: Former Smoker    Quit date: 10/30/1983  . Smokeless tobacco: Current User    Types: Chew  . Alcohol Use: No  . Drug Use: No  . Sexually Active: Not on file   Other Topics Concern  . Not on file   Social History Narrative   Coffee daily     Past Surgical History  Procedure Date  . Appendectomy   . Wrist surgery     right  . Shoulder surgery     right  . Back surgery     Family History  Problem Relation Age of Onset  . Aneurysm Mother   . Alcohol abuse Father   . Colon cancer Neg Hx     No Known Allergies  Current Outpatient Prescriptions on File Prior to Visit  Medication Sig Dispense Refill  . aspirin 81 MG tablet Take 81 mg by mouth daily.        . cetirizine (ZYRTEC) 5 MG tablet Take 5 mg by mouth daily.        Marland Kitchen HYDROcodone-acetaminophen (VICODIN) 5-500 MG per tablet Take 1 tablet by mouth 2 (two) times daily.   30 tablet  2  . Insulin Isophane Human (HUMULIN N [place]) Inject into the skin. Injects 32 units three times a day       . insulin NPH (NOVOLIN N) 100 UNIT/ML injection Inject 70 Units into the skin 2 (two) times daily.  50 mL  5  . lisinopril (PRINIVIL,ZESTRIL) 40 MG tablet TAKE ONE TABLET BY MOUTH EVERY DAY  90 tablet  1  . metFORMIN (GLUCOPHAGE) 1000 MG tablet TAKE ONE TABLET BY MOUTH TWICE DAILY  120 tablet  3  . Multiple Vitamin (MULTIVITAMIN) tablet Take 1 tablet by mouth daily.        . simvastatin (ZOCOR) 80 MG tablet TAKE ONE TABLET BY MOUTH AT BEDTIME  90 tablet  1  . torsemide (DEMADEX) 20 MG tablet TAKE ONE TABLET BY MOUTH EVERY DAY  90 tablet  1     patient denies chest pain, shortness of breath, orthopnea. Denies lower extremity edema, abdominal pain, change in appetite, change in bowel movements. Patient denies rashes, musculoskeletal complaints. No other specific complaints in a complete review of systems.   BP 122/64  Pulse 105  Temp(Src) 98.2 F (36.8 C) (Oral)  Wt 329 lb (149.233 kg)  SpO2 98%  well-developed  well-nourished male in no acute distress. Wearing O2.  HEENT exam atraumatic, normocephalic, neck supple without jugular venous distention. Chest clear to auscultation cardiac exam S1-S2 are regular. Abdominal exam overweight with bowel sounds, soft and nontender. Extremities no edema. Neurologic exam is alert with a normal gait.

## 2012-01-03 NOTE — Assessment & Plan Note (Signed)
Check labs today.

## 2012-01-03 NOTE — Telephone Encounter (Signed)
Pt needs to know what kind of shingles he will be getting per insurance company.  Dr. Cato Mulligan asked him to find out.

## 2012-01-03 NOTE — Patient Instructions (Signed)
Call your insurance company and see if they will cover shingles vaccine. If they will, call us and we will give it to you  

## 2012-01-03 NOTE — Assessment & Plan Note (Signed)
Home cbgs 100-170 Will check labs today He understands need for weight loss- he is not interested in changing lifestyle to accomplish goal

## 2012-01-04 ENCOUNTER — Ambulatory Visit: Payer: PRIVATE HEALTH INSURANCE | Admitting: Internal Medicine

## 2012-01-31 ENCOUNTER — Ambulatory Visit (INDEPENDENT_AMBULATORY_CARE_PROVIDER_SITE_OTHER): Payer: Medicare Other | Admitting: Internal Medicine

## 2012-01-31 ENCOUNTER — Encounter: Payer: Self-pay | Admitting: Internal Medicine

## 2012-01-31 ENCOUNTER — Encounter (INDEPENDENT_AMBULATORY_CARE_PROVIDER_SITE_OTHER): Payer: Medicare Other

## 2012-01-31 ENCOUNTER — Telehealth: Payer: Self-pay

## 2012-01-31 VITALS — BP 130/74

## 2012-01-31 DIAGNOSIS — M7989 Other specified soft tissue disorders: Secondary | ICD-10-CM

## 2012-01-31 DIAGNOSIS — M25571 Pain in right ankle and joints of right foot: Secondary | ICD-10-CM

## 2012-01-31 DIAGNOSIS — M79609 Pain in unspecified limb: Secondary | ICD-10-CM

## 2012-01-31 DIAGNOSIS — M25579 Pain in unspecified ankle and joints of unspecified foot: Secondary | ICD-10-CM

## 2012-01-31 MED ORDER — DICLOFENAC SODIUM 75 MG PO TBEC: 75.0000 mg | DELAYED_RELEASE_TABLET | Freq: Two times a day (BID) | ORAL | Status: DC

## 2012-01-31 MED ORDER — METHYLPREDNISOLONE ACETATE 80 MG/ML IJ SUSP
80.0000 mg | Freq: Once | INTRAMUSCULAR | Status: AC
  Administered 2012-01-31: 80 mg via INTRAMUSCULAR

## 2012-01-31 NOTE — Patient Instructions (Signed)
Take pain/anti-inflammatory medication twice daily  You  may move around, but avoid painful motions and activities.  Apply ice to the sore area for 15 to 20 minutes 3 or 4 times daily for the next two to 3 days.  Keep right leg elevated as much as possible  Venous Doppler study as discussed  Take hydrocodone every 4-6 hours as needed  Call or return to clinic prn if these symptoms worsen or fail to improve as anticipated.

## 2012-01-31 NOTE — Telephone Encounter (Signed)
Triage VM:  Pt called and states that he woke up this morning and his right ankle was sore and swollen.  Pt states it hurts to put weight on his ankle.   Called pt back and advised on appt.  Pt has an appt on 01/30/12 at 2:15 pm.

## 2012-01-31 NOTE — Progress Notes (Signed)
  Subjective:    Patient ID: [name], male    DOB: September 16, 1946, 66 y.o.   MRN: [id number]  HPI  66 year old patient who has a history of osteoarthritis and remote fracture involving the right ankle. He woke this morning with pain and swelling involving the right ankle which has worsened throughout the day. No prior history of gout. He does have a history of combined systolic and diastolic heart failure and does have chronic lower extremity edema. He states the right leg is always more swollen than the left. His cardiopulmonary status has been stable. He is on insulin injections 3 times daily    Review of Systems  Constitutional: Negative for fever, chills, appetite change and fatigue.  HENT: Negative for hearing loss, ear pain, congestion, sore throat, trouble swallowing, neck stiffness, dental problem, voice change and tinnitus.   Eyes: Negative for pain, discharge and visual disturbance.  Respiratory: Negative for cough, chest tightness, wheezing and stridor.   Cardiovascular: Positive for leg swelling. Negative for chest pain and palpitations.  Gastrointestinal: Negative for nausea, vomiting, abdominal pain, diarrhea, constipation, blood in stool and abdominal distention.  Genitourinary: Negative for urgency, hematuria, flank pain, discharge, difficulty urinating and genital sores.  Musculoskeletal: Positive for joint swelling. Negative for myalgias, back pain, arthralgias and gait problem.  Skin: Negative for rash.  Neurological: Negative for dizziness, syncope, speech difficulty, weakness, numbness and headaches.  Hematological: Negative for adenopathy. Does not bruise/bleed easily.  Psychiatric/Behavioral: Negative for behavioral problems and dysphoric mood. The patient is not nervous/anxious.        Objective:   Physical Exam  Constitutional:       Obese Blood pressure 130/74  Musculoskeletal:       The right ankle was slightly swollen tender and warm to touch  The  right calf was nontender but was much more swollen compared to the left           Assessment & Plan:   Right ankle pain. Possible gout although no prior history. Will treat with naproxen short-term. We'll obtain lower extremity venous Doppler evaluation to rule out DVT Continue other analgesics every 4-6 hours

## 2012-02-25 ENCOUNTER — Other Ambulatory Visit: Payer: Self-pay | Admitting: Internal Medicine

## 2012-03-01 ENCOUNTER — Other Ambulatory Visit: Payer: Self-pay | Admitting: Internal Medicine

## 2012-04-14 ENCOUNTER — Other Ambulatory Visit: Payer: Self-pay | Admitting: *Deleted

## 2012-04-14 MED ORDER — SIMVASTATIN 80 MG PO TABS: 80.0000 mg | ORAL_TABLET | Freq: Every day | ORAL | Status: DC

## 2012-04-14 MED ORDER — TORSEMIDE 20 MG PO TABS: 20.0000 mg | ORAL_TABLET | Freq: Every day | ORAL | Status: DC

## 2012-07-11 ENCOUNTER — Ambulatory Visit (INDEPENDENT_AMBULATORY_CARE_PROVIDER_SITE_OTHER): Payer: Medicare Other | Admitting: Internal Medicine

## 2012-07-11 ENCOUNTER — Encounter: Payer: Self-pay | Admitting: Internal Medicine

## 2012-07-11 VITALS — BP 120/72 | HR 96 | Temp 98.6°F | Wt 332.0 lb

## 2012-07-11 DIAGNOSIS — Z23 Encounter for immunization: Secondary | ICD-10-CM

## 2012-07-11 DIAGNOSIS — I5040 Unspecified combined systolic (congestive) and diastolic (congestive) heart failure: Secondary | ICD-10-CM

## 2012-07-11 DIAGNOSIS — E669 Obesity, unspecified: Secondary | ICD-10-CM

## 2012-07-11 DIAGNOSIS — E785 Hyperlipidemia, unspecified: Secondary | ICD-10-CM

## 2012-07-11 DIAGNOSIS — E119 Type 2 diabetes mellitus without complications: Secondary | ICD-10-CM

## 2012-07-11 DIAGNOSIS — I10 Essential (primary) hypertension: Secondary | ICD-10-CM

## 2012-07-11 LAB — HEPATIC FUNCTION PANEL
AST: 34 U/L (ref 0–37)
Total Bilirubin: 0.6 mg/dL (ref 0.3–1.2)

## 2012-07-11 LAB — BASIC METABOLIC PANEL
BUN: 19 mg/dL (ref 6–23)
GFR: 94.26 mL/min (ref >60.00)
Potassium: 4.7 mEq/L (ref 3.5–5.1)
Sodium: 136 mEq/L (ref 135–145)

## 2012-07-11 LAB — LIPID PANEL
Total CHOL/HDL Ratio: 5
Triglycerides: 406 mg/dL — ABNORMAL HIGH (ref 0.0–149.0)
VLDL: 81.2 mg/dL — ABNORMAL HIGH (ref 0.0–40.0)

## 2012-07-11 LAB — HEMOGLOBIN A1C: Hgb A1c MFr Bld: 6.2 % (ref 4.6–6.5)

## 2012-07-11 LAB — LDL CHOLESTEROL, DIRECT: Direct LDL: 81.2 mg/dL

## 2012-07-11 NOTE — Assessment & Plan Note (Signed)
Reasonable control- continue meds

## 2012-07-11 NOTE — Assessment & Plan Note (Signed)
Reasonable control- Continue meds Check labs

## 2012-07-11 NOTE — Assessment & Plan Note (Signed)
He is not compliant with diet or exercise and has no interest in losing weight

## 2012-07-11 NOTE — Assessment & Plan Note (Signed)
Almost certainly related to weight  He understands need to lose weight

## 2012-07-11 NOTE — Progress Notes (Signed)
Patient ID: [name], male   DOB: 1946-01-20, 66 y.o.   MRN: [id number]   patient comes in for followup of multiple medical problems including type 2 diabetes, hyperlipidemia, hypertension. The patient does not check blood sugar or blood pressure at home. The patetient does not follow an exercise or diet program. The patient denies any polyuria, polydipsia.  In the past the patient has gone to diabetic treatment center. The patient is tolerating medications  Without difficulty. The patient does admit to medication compliance.   Past Medical History  Diagnosis Date  . DIABETES MELLITUS, TYPE II 06/24/2007  . HYPERLIPIDEMIA 06/24/2007  . HYPERTENSION 05/01/2007  . Hypoxemia 01/27/2010  . OBESITY 09/02/2008  . OSTEOARTHRITIS 05/01/2007  . 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 OSA (obstructive sleep apnea)   . COPD (chronic obstructive pulmonary disease)   . Asthma     as a child  . Cataract     History   Social History  . Marital Status: Married    Spouse Name: N/A    Number of Children: N/A  . Years of Education: N/A   Occupational History  . Retired    Social History Main Topics  . Smoking status: Former Smoker    Quit date: 10/30/1983  . Smokeless tobacco: Current User    Types: Chew  . Alcohol Use: No  . Drug Use: No  . Sexually Active: Not on file   Other Topics Concern  . Not on file   Social History Narrative   Coffee daily     Past Surgical History  Procedure Date  . Appendectomy   . Wrist surgery     right  . Shoulder surgery     right  . Back surgery     Family History  Problem Relation Age of Onset  . Aneurysm Mother   . Alcohol abuse Father   . Colon cancer Neg Hx     No Known Allergies  Current Outpatient Prescriptions on File Prior to Visit  Medication Sig Dispense Refill  . aspirin 81 MG tablet Take 81 mg by mouth daily.        . cetirizine (ZYRTEC) 5 MG tablet Take 5 mg by mouth daily.        .  clotrimazole-betamethasone (LOTRISONE) cream Apply topically 2 (two) times daily.  30 g  0  . HYDROcodone-acetaminophen (VICODIN) 5-500 MG per tablet Take 1 tablet by mouth 2 (two) times daily.  30 tablet  2  . Insulin Isophane Human (HUMULIN N [place]) Inject into the skin. Injects 32 units three times a day       . lisinopril (PRINIVIL,ZESTRIL) 40 MG tablet Take 1 tablet (40 mg total) by mouth daily.  90 tablet  3  . metFORMIN (GLUCOPHAGE) 1000 MG tablet TAKE ONE TABLET BY MOUTH TWICE DAILY  120 tablet  1  . Multiple Vitamin (MULTIVITAMIN) tablet Take 1 tablet by mouth daily.        Marland Kitchen NOVOLIN N [place] 100 UNIT/ML injection INJECT 70 UNITS SUBCUTANEOUSLY TWICE A DAY  50 mL  4  . simvastatin (ZOCOR) 80 MG tablet Take 1 tablet (80 mg total) by mouth at bedtime.  90 tablet  1  . torsemide (DEMADEX) 20 MG tablet Take 1 tablet (20 mg total) by mouth daily.  90 tablet  1     patient denies chest pain, shortness of breath, orthopnea. Denies lower extremity edema, abdominal pain, change in appetite, change in bowel movements. Patient denies rashes,  musculoskeletal complaints. No other specific complaints in a complete review of systems.   BP 120/72  Pulse 96  Temp 98.6 F (37 C) (Oral)  Wt 332 lb (150.594 kg)  SpO2 99% obese male in no acute distress, wearing oxygen nasal canula. HEENT exam atraumatic, normocephalic, neck supple without jugular venous distention. Chest clear to auscultation cardiac exam S1-S2 are regular. Abdominal exam overweight with bowel sounds, soft and nontender. Extremities no edema. Neurologic exam is alert with a normal gait.

## 2012-07-11 NOTE — Assessment & Plan Note (Signed)
Will check labs today

## 2012-07-26 ENCOUNTER — Other Ambulatory Visit: Payer: Self-pay | Admitting: Internal Medicine

## 2012-08-13 ENCOUNTER — Other Ambulatory Visit: Payer: Self-pay | Admitting: Internal Medicine

## 2012-08-30 ENCOUNTER — Other Ambulatory Visit: Payer: Self-pay | Admitting: Internal Medicine

## 2012-09-29 ENCOUNTER — Telehealth: Payer: Self-pay | Admitting: Internal Medicine

## 2012-09-29 DIAGNOSIS — E119 Type 2 diabetes mellitus without complications: Secondary | ICD-10-CM

## 2012-09-29 NOTE — Telephone Encounter (Signed)
Patient is calling to request RX for Diabetic supplies.  Pt states that he and his wife used to get their diabetic supplies from [facility].  But Pt states after the change with ObamaCare, he will not be able to get his supplies there.  He is has called and the pharmacy that if Dr Cato Mulligan will write a prescription for a machine and strips, he can use his insurance to get the supplies.  Pt is requesting RX be sent to [facility] in [place].  [phone])

## 2012-09-30 MED ORDER — GLUCOSE BLOOD VI STRP: ORAL_STRIP | Status: DC

## 2012-09-30 MED ORDER — FREESTYLE LANCETS MISC: Status: DC

## 2012-09-30 NOTE — Telephone Encounter (Signed)
rx sent in electronically for test strips and lancets, glucometer up front for pt to p/u, pt aware

## 2012-10-04 ENCOUNTER — Other Ambulatory Visit: Payer: Self-pay | Admitting: Internal Medicine

## 2012-10-06 ENCOUNTER — Other Ambulatory Visit: Payer: Self-pay | Admitting: Internal Medicine

## 2012-11-01 ENCOUNTER — Other Ambulatory Visit: Payer: Self-pay | Admitting: Internal Medicine

## 2013-01-04 NOTE — Assessment & Plan Note (Signed)
On chronic home O2. Sxs are stable

## 2013-01-04 NOTE — Assessment & Plan Note (Signed)
Desperately needs to lose weight He is not interested in following a diet or exercise plan

## 2013-01-04 NOTE — Assessment & Plan Note (Signed)
This is, by foar, his most significant problem. He is not interested in losing weight or changing his lifestyle

## 2013-01-04 NOTE — Assessment & Plan Note (Signed)
Check labs today.

## 2013-01-04 NOTE — Progress Notes (Signed)
patient comes in for followup of multiple medical problems including type 2 diabetes, hyperlipidemia, hypertension. The patient does not check blood sugar or blood pressure at home. The patetient does not follow an exercise or diet program. The patient denies any polyuria, polydipsia.  In the past the patient has gone to diabetic treatment center. The patient is tolerating medications  Without difficulty. The patient does admit to medication compliance.   Past Medical History  Diagnosis Date  . DIABETES MELLITUS, TYPE II 06/24/2007  . HYPERLIPIDEMIA 06/24/2007  . HYPERTENSION 05/01/2007  . Hypoxemia 01/27/2010  . OBESITY 09/02/2008  . OSTEOARTHRITIS 05/01/2007  . 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 OSA (obstructive sleep apnea)   . COPD (chronic obstructive pulmonary disease)   . Asthma     as a child  . Cataract     History   Social History  . Marital Status: Married    Spouse Name: N/A    Number of Children: N/A  . Years of Education: N/A   Occupational History  . Retired    Social History Main Topics  . Smoking status: Former Smoker    Quit date: 10/30/1983  . Smokeless tobacco: Current User    Types: Chew  . Alcohol Use: No  . Drug Use: No  . Sexually Active: Not on file   Other Topics Concern  . Not on file   Social History Narrative   Coffee daily     Past Surgical History  Procedure Laterality Date  . Appendectomy    . Wrist surgery      right  . Shoulder surgery      right  . Back surgery      Family History  Problem Relation Age of Onset  . Aneurysm Mother   . Alcohol abuse Father   . Colon cancer Neg Hx     No Known Allergies  Current Outpatient Prescriptions on File Prior to Visit  Medication Sig Dispense Refill  . aspirin 81 MG tablet Take 81 mg by mouth daily.        . cetirizine (ZYRTEC) 5 MG tablet Take 5 mg by mouth daily.        . clotrimazole-betamethasone (LOTRISONE) cream APPLY TO AFFECTED AREA TWICE DAILY  30 g   0  . glucose blood (FREESTYLE LITE) test strip Use as instructed  100 each  12  . HYDROcodone-acetaminophen (VICODIN) 5-500 MG per tablet Take 1 tablet by mouth 2 (two) times daily.  30 tablet  2  . Lancets (FREESTYLE) lancets Use as instructed  100 each  12  . lisinopril (PRINIVIL,ZESTRIL) 40 MG tablet Take 1 tablet (40 mg total) by mouth daily.  90 tablet  3  . metFORMIN (GLUCOPHAGE) 1000 MG tablet TAKE ONE TABLET BY MOUTH TWICE DAILY  120 tablet  0  . Multiple Vitamin (MULTIVITAMIN) tablet Take 1 tablet by mouth daily.        Marland Kitchen NOVOLIN N RELION 100 UNIT/ML injection INJECT 70 UNITS SUBCUTANEOUSLY TWICE A DAY  50 mL  4  . NOVOLIN R RELION 100 UNIT/ML injection INJECT 32 UNITS SUBCUTANEOUSLY THREE TIMES A DAY WITH MEALS.  50 mL  PRN  . simvastatin (ZOCOR) 80 MG tablet TAKE ONE TABLET BY MOUTH AT BEDTIME  90 tablet  0  . torsemide (DEMADEX) 20 MG tablet TAKE ONE TABLET BY MOUTH EVERY DAY  90 tablet  0   No current facility-administered medications on file prior to visit.     patient denies chest  pain, shortness of breath, orthopnea. Denies lower extremity edema, abdominal pain, change in appetite, change in bowel movements. Patient denies rashes, musculoskeletal complaints. No other specific complaints in a complete review of systems.   BP 120/74  Temp(Src) 98.2 F (36.8 C) (Oral)  Wt 332 lb (150.594 kg)  BMI 49.01 kg/m2  well-developed well-nourished male in no acute distress. HEENT exam atraumatic, normocephalic, neck supple without jugular venous distention. Chest clear to auscultation cardiac exam S1-S2 are regular. Abdominal exam overweight with bowel sounds, soft and nontender. Extremities no edema. Neurologic exam is alert with a normal gait.

## 2013-01-05 ENCOUNTER — Ambulatory Visit (INDEPENDENT_AMBULATORY_CARE_PROVIDER_SITE_OTHER): Payer: Medicare Other | Admitting: Internal Medicine

## 2013-01-05 ENCOUNTER — Other Ambulatory Visit: Payer: Self-pay | Admitting: Internal Medicine

## 2013-01-05 VITALS — BP 120/74 | Temp 98.2°F | Wt 332.0 lb

## 2013-01-05 DIAGNOSIS — E785 Hyperlipidemia, unspecified: Secondary | ICD-10-CM

## 2013-01-05 DIAGNOSIS — I10 Essential (primary) hypertension: Secondary | ICD-10-CM

## 2013-01-05 DIAGNOSIS — E669 Obesity, unspecified: Secondary | ICD-10-CM

## 2013-01-05 DIAGNOSIS — E119 Type 2 diabetes mellitus without complications: Secondary | ICD-10-CM

## 2013-01-05 DIAGNOSIS — I5040 Unspecified combined systolic (congestive) and diastolic (congestive) heart failure: Secondary | ICD-10-CM

## 2013-01-05 LAB — LIPID PANEL: Total CHOL/HDL Ratio: 5

## 2013-01-05 LAB — BASIC METABOLIC PANEL
CO2: 28 mEq/L (ref 19–32)
Chloride: 100 mEq/L (ref 96–112)
Sodium: 138 mEq/L (ref 135–145)

## 2013-01-05 LAB — LDL CHOLESTEROL, DIRECT: Direct LDL: 75.1 mg/dL

## 2013-01-05 LAB — HEPATIC FUNCTION PANEL
ALT: 34 U/L (ref 0–53)
AST: 31 U/L (ref 0–37)
Bilirubin, Direct: 0.1 mg/dL (ref 0.0–0.3)
Total Bilirubin: 0.6 mg/dL (ref 0.3–1.2)

## 2013-01-05 NOTE — Assessment & Plan Note (Signed)
Fair control Continue current meds 

## 2013-01-06 ENCOUNTER — Other Ambulatory Visit: Payer: Self-pay | Admitting: *Deleted

## 2013-01-06 MED ORDER — CLOTRIMAZOLE-BETAMETHASONE 1-0.05 % EX CREA: TOPICAL_CREAM | CUTANEOUS | Status: DC

## 2013-01-09 ENCOUNTER — Ambulatory Visit: Payer: Medicare Other | Admitting: Internal Medicine

## 2013-01-11 ENCOUNTER — Emergency Department (HOSPITAL_COMMUNITY)
Admission: EM | Admit: 2013-01-11 | Discharge: 2013-01-11 | Disposition: A | Discharge status: Home / Self Care | Payer: Medicare Other | Attending: Emergency Medicine | Admitting: Emergency Medicine

## 2013-01-11 ENCOUNTER — Encounter (HOSPITAL_COMMUNITY): Payer: Self-pay | Admitting: Physical Medicine and Rehabilitation

## 2013-01-11 DIAGNOSIS — R5381 Other malaise: Secondary | ICD-10-CM | POA: Insufficient documentation

## 2013-01-11 DIAGNOSIS — Z8739 Personal history of other diseases of the musculoskeletal system and connective tissue: Secondary | ICD-10-CM | POA: Insufficient documentation

## 2013-01-11 DIAGNOSIS — E119 Type 2 diabetes mellitus without complications: Secondary | ICD-10-CM | POA: Insufficient documentation

## 2013-01-11 DIAGNOSIS — R61 Generalized hyperhidrosis: Secondary | ICD-10-CM | POA: Insufficient documentation

## 2013-01-11 DIAGNOSIS — R112 Nausea with vomiting, unspecified: Secondary | ICD-10-CM | POA: Insufficient documentation

## 2013-01-11 DIAGNOSIS — Z8669 Personal history of other diseases of the nervous system and sense organs: Secondary | ICD-10-CM | POA: Insufficient documentation

## 2013-01-11 DIAGNOSIS — I10 Essential (primary) hypertension: Secondary | ICD-10-CM | POA: Insufficient documentation

## 2013-01-11 DIAGNOSIS — Z7982 Long term (current) use of aspirin: Secondary | ICD-10-CM | POA: Insufficient documentation

## 2013-01-11 DIAGNOSIS — J449 Chronic obstructive pulmonary disease, unspecified: Secondary | ICD-10-CM | POA: Insufficient documentation

## 2013-01-11 DIAGNOSIS — Z794 Long term (current) use of insulin: Secondary | ICD-10-CM | POA: Insufficient documentation

## 2013-01-11 DIAGNOSIS — K5289 Other specified noninfective gastroenteritis and colitis: Secondary | ICD-10-CM | POA: Insufficient documentation

## 2013-01-11 DIAGNOSIS — E669 Obesity, unspecified: Secondary | ICD-10-CM | POA: Insufficient documentation

## 2013-01-11 DIAGNOSIS — I5040 Unspecified combined systolic (congestive) and diastolic (congestive) heart failure: Secondary | ICD-10-CM | POA: Insufficient documentation

## 2013-01-11 DIAGNOSIS — K529 Noninfective gastroenteritis and colitis, unspecified: Secondary | ICD-10-CM

## 2013-01-11 DIAGNOSIS — Z87891 Personal history of nicotine dependence: Secondary | ICD-10-CM | POA: Insufficient documentation

## 2013-01-11 DIAGNOSIS — Z79899 Other long term (current) drug therapy: Secondary | ICD-10-CM | POA: Insufficient documentation

## 2013-01-11 DIAGNOSIS — J4489 Other specified chronic obstructive pulmonary disease: Secondary | ICD-10-CM | POA: Insufficient documentation

## 2013-01-11 DIAGNOSIS — E785 Hyperlipidemia, unspecified: Secondary | ICD-10-CM | POA: Insufficient documentation

## 2013-01-11 LAB — GLUCOSE, CAPILLARY: Glucose-Capillary: 164 mg/dL — ABNORMAL HIGH (ref 70–99)

## 2013-01-11 LAB — CBC WITH DIFFERENTIAL/PLATELET
Basophils Absolute: 0 10*3/uL (ref 0.0–0.1)
Eosinophils Relative: 1 % (ref 0–5)
Lymphocytes Relative: 11 % — ABNORMAL LOW (ref 12–46)
Lymphs Abs: 1.6 10*3/uL (ref 0.7–4.0)
MCV: 83.8 fL (ref 78.0–100.0)
Neutrophils Relative %: 78 % — ABNORMAL HIGH (ref 43–77)
Platelets: 200 10*3/uL (ref 150–400)
RBC: 5 MIL/uL (ref 4.22–5.81)
RDW: 15.4 % (ref 11.5–15.5)
WBC: 13.6 10*3/uL — ABNORMAL HIGH (ref 4.0–10.5)

## 2013-01-11 LAB — COMPREHENSIVE METABOLIC PANEL
ALT: 32 U/L (ref 0–53)
AST: 23 U/L (ref 0–37)
Alkaline Phosphatase: 49 U/L (ref 39–117)
CO2: 20 mEq/L (ref 19–32)
Calcium: 8.2 mg/dL — ABNORMAL LOW (ref 8.4–10.5)
GFR calc non Af Amer: 41 mL/min — ABNORMAL LOW (ref >90)
Potassium: 3.4 mEq/L — ABNORMAL LOW (ref 3.5–5.1)
Sodium: 133 mEq/L — ABNORMAL LOW (ref 135–145)
Total Protein: 7.3 g/dL (ref 6.0–8.3)

## 2013-01-11 LAB — URINALYSIS, ROUTINE W REFLEX MICROSCOPIC
Glucose, UA: NEGATIVE mg/dL
Hgb urine dipstick: NEGATIVE
Leukocytes, UA: NEGATIVE
pH: 5 (ref 5.0–8.0)

## 2013-01-11 MED ORDER — SODIUM CHLORIDE 0.9 % IV BOLUS (SEPSIS)
500.0000 mL | Freq: Once | INTRAVENOUS | Status: AC
  Administered 2013-01-11: 500 mL via INTRAVENOUS

## 2013-01-11 MED ORDER — DIPHENOXYLATE-ATROPINE 2.5-0.025 MG PO TABS: 1.0000 | ORAL_TABLET | Freq: Four times a day (QID) | ORAL | Status: DC | PRN

## 2013-01-11 MED ORDER — PROMETHAZINE HCL 25 MG PO TABS: 25.0000 mg | ORAL_TABLET | Freq: Four times a day (QID) | ORAL | Status: DC | PRN

## 2013-01-11 MED ORDER — ONDANSETRON HCL 4 MG/2ML IJ SOLN
4.0000 mg | Freq: Once | INTRAMUSCULAR | Status: AC
  Administered 2013-01-11: 4 mg via INTRAVENOUS
  Filled 2013-01-11: qty 2

## 2013-01-11 NOTE — ED Notes (Signed)
CBG was 164

## 2013-01-11 NOTE — ED Notes (Signed)
Attempted to collect urine, pt could not void 

## 2013-01-11 NOTE — ED Notes (Signed)
Attempted to collect urine, pt unable to void.  

## 2013-01-11 NOTE — ED Notes (Addendum)
Pt presents to department for evaluation of diarrhea x4 days. States dark colored liquid stool. States he broke out into cold sweat and became weak. Denies abdominal pain. Denies urinary symptoms. Pt is alert and oriented x4. Wears 2L O2 at home.

## 2013-01-11 NOTE — ED Provider Notes (Signed)
History     CSN: [id number]  Arrival date & time 01/11/13  6962   First MD Initiated Contact with Patient 01/11/13 [phone]      Chief Complaint  Patient presents with  . Diarrhea  . Weakness    (Consider location/radiation/quality/duration/timing/severity/associated sxs/prior treatment) HPI Comments: Patient comes to the ER for evaluation of diarrhea. Patient reports that he has had watery diarrhea for 4 days. There has not been any blood in the stool, stool is dark brown. This morning he woke up feeling much worse. He is feeling very weak and says he broke out in a cold sweat this morning. He is not experiencing abdominal pain. While waiting in the ER waiting room, patient had emesis x1. No blood. Patient wears oxygen at home, does not report any shortness of breath/change from baseline. He is not experiencing any chest pain.  Patient is a 67 y.o. male presenting with diarrhea and weakness.  Diarrhea Associated symptoms: chills, diaphoresis and vomiting   Associated symptoms: no abdominal pain   Weakness Pertinent negatives include no abdominal pain.    Past Medical History  Diagnosis Date  . DIABETES MELLITUS, TYPE II 06/24/2007  . HYPERLIPIDEMIA 06/24/2007  . HYPERTENSION 05/01/2007  . Hypoxemia 01/27/2010  . OBESITY 09/02/2008  . OSTEOARTHRITIS 05/01/2007  . 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 OSA (obstructive sleep apnea)   . COPD (chronic obstructive pulmonary disease)   . Asthma     as a child  . Cataract     Past Surgical History  Procedure Laterality Date  . Appendectomy    . Wrist surgery      right  . Shoulder surgery      right  . Back surgery      Family History  Problem Relation Age of Onset  . Aneurysm Mother   . Alcohol abuse Father   . Colon cancer Neg Hx     History  Substance Use Topics  . Smoking status: Former Smoker    Quit date: 10/30/1983  . Smokeless tobacco: Current User    Types: Chew  . Alcohol Use: No       Review of Systems  Constitutional: Positive for chills and diaphoresis.  Respiratory: Negative.   Cardiovascular: Negative.   Gastrointestinal: Positive for nausea, vomiting and diarrhea. Negative for abdominal pain and anal bleeding.  Genitourinary: Negative.   Neurological: Positive for weakness.  All other systems reviewed and are negative.    Allergies  Review of patient's allergies indicates no known allergies.  Home Medications   Current Outpatient Rx  Name  Route  Sig  Dispense  Refill  . aspirin 81 MG tablet   Oral   Take 81 mg by mouth daily.           . beta carotene w/minerals (OCUVITE) tablet   Oral   Take 1 tablet by mouth daily.         . cetirizine (ZYRTEC) 5 MG tablet   Oral   Take 5 mg by mouth daily.           . clotrimazole-betamethasone (LOTRISONE) cream      APPLY TO AFFECTED AREA TWICE DAILY   30 g   5   . HYDROcodone-acetaminophen (VICODIN) 5-500 MG per tablet   Oral   Take 1 tablet by mouth 2 (two) times daily.   30 tablet   2   . lisinopril (PRINIVIL,ZESTRIL) 40 MG tablet      TAKE ONE TABLET BY MOUTH EVERY  DAY   90 tablet   0   . metFORMIN (GLUCOPHAGE) 1000 MG tablet      TAKE ONE TABLET BY MOUTH TWICE DAILY   120 tablet   0   . Multiple Vitamin (MULTIVITAMIN) tablet   Oral   Take 1 tablet by mouth daily.           Marland Kitchen NOVOLIN N RELION 100 UNIT/ML injection      INJECT 70 UNITS SUBCUTANEOUSLY TWICE A DAY   50 mL   4   . NOVOLIN R RELION 100 UNIT/ML injection      INJECT 32 UNITS SUBCUTANEOUSLY THREE TIMES A DAY WITH MEALS.   50 mL   PRN   . simvastatin (ZOCOR) 80 MG tablet      TAKE ONE TABLET BY MOUTH AT BEDTIME   90 tablet   0   . torsemide (DEMADEX) 20 MG tablet      TAKE ONE TABLET BY MOUTH EVERY DAY   90 tablet   0   . glucose blood (FREESTYLE LITE) test strip      Use as instructed   100 each   12   . Lancets (FREESTYLE) lancets      Use as instructed   100 each   12     BP  90/53  Pulse 101  Temp(Src) 97.4 F (36.3 C) (Oral)  Resp 18  Ht 5\' 8"  (1.727 m)  Wt 330 lb (149.687 kg)  BMI 50.19 kg/m2  SpO2 93%  Physical Exam  Constitutional: He is oriented to person, place, and time. He appears well-developed and well-nourished. No distress.  HENT:  Head: Normocephalic and atraumatic.  Right Ear: Hearing normal.  Nose: Nose normal.  Mouth/Throat: Oropharynx is clear and moist and mucous membranes are normal.  Eyes: Conjunctivae and EOM are normal. Pupils are equal, round, and reactive to light.  Neck: Normal range of motion. Neck supple.  Cardiovascular: Normal rate, regular rhythm, S1 normal and S2 normal.  Exam reveals no gallop and no friction rub.   No murmur heard. Pulmonary/Chest: Effort normal and breath sounds normal. No respiratory distress. He exhibits no tenderness.  Abdominal: Soft. Normal appearance and bowel sounds are normal. There is no hepatosplenomegaly. There is no tenderness. There is no rebound, no guarding, no tenderness at McBurney's point and negative Murphy's sign. No hernia.  Musculoskeletal: Normal range of motion.  Neurological: He is alert and oriented to person, place, and time. He has normal strength. No cranial nerve deficit or sensory deficit. Coordination normal. GCS eye subscore is 4. GCS verbal subscore is 5. GCS motor subscore is 6.  Skin: Skin is warm, dry and intact. No rash noted. No cyanosis.  Psychiatric: He has a normal mood and affect. His speech is normal and behavior is normal. Thought content normal.    ED Course  Procedures (including critical care time)  Labs Reviewed  CBC WITH DIFFERENTIAL - Abnormal; Notable for the following:    WBC 13.6 (*)    Neutrophils Relative 78 (*)    Neutro Abs 10.6 (*)    Lymphocytes Relative 11 (*)    Monocytes Absolute 1.3 (*)    All other components within normal limits  COMPREHENSIVE METABOLIC PANEL - Abnormal; Notable for the following:    Sodium 133 (*)    Potassium  3.4 (*)    Glucose, Bld 193 (*)    BUN 46 (*)    Creatinine, Ser 1.66 (*)    Calcium 8.2 (*)  GFR calc non Af Amer 41 (*)    GFR calc Af Amer 48 (*)    All other components within normal limits  LIPASE, BLOOD - Abnormal; Notable for the following:    Lipase 112 (*)    All other components within normal limits  URINALYSIS, ROUTINE W REFLEX MICROSCOPIC - Abnormal; Notable for the following:    Bilirubin Urine SMALL (*)    Ketones, ur 15 (*)    All other components within normal limits  GLUCOSE, CAPILLARY - Abnormal; Notable for the following:    Glucose-Capillary 164 (*)    All other components within normal limits  TROPONIN I   No results found.   Diagnoses: Gastroenteritis    MDM  Patient presented to the ER for evaluation of frequent watery diarrhea for 4 days. Patient had onset of nausea and vomiting this morning as well. He is not experiencing any abdominal pain. His abdominal exam is benign. Patient was hypertensive and slightly tachycardic at arrival. This has improved with IV fluid bolus. Labs are otherwise unremarkable. He did have slight elevation of LFTs and lipase, but as already noted, no pain or tenderness. He reports to me that he has already had his gallbladder out. Patient is sitting up in the room, drinking water without difficulty. He feels that he is back to his normal baseline. Patient will be discharged, followup with his Dr. Return if symptoms worsen.        Gilda Crease, MD 01/11/13 [phone]

## 2013-01-19 ENCOUNTER — Encounter: Payer: Self-pay | Admitting: Internal Medicine

## 2013-01-19 ENCOUNTER — Ambulatory Visit (INDEPENDENT_AMBULATORY_CARE_PROVIDER_SITE_OTHER): Payer: Medicare Other | Admitting: Internal Medicine

## 2013-01-19 VITALS — BP 116/64 | HR 107 | Temp 97.7°F | Resp 20 | Wt 330.0 lb

## 2013-01-19 DIAGNOSIS — I10 Essential (primary) hypertension: Secondary | ICD-10-CM

## 2013-01-19 DIAGNOSIS — E119 Type 2 diabetes mellitus without complications: Secondary | ICD-10-CM

## 2013-01-19 DIAGNOSIS — L03039 Cellulitis of unspecified toe: Secondary | ICD-10-CM

## 2013-01-19 MED ORDER — AMOXICILLIN-POT CLAVULANATE 875-125 MG PO TABS: 1.0000 | ORAL_TABLET | Freq: Two times a day (BID) | ORAL | Status: DC

## 2013-01-19 NOTE — Progress Notes (Signed)
Subjective:    Patient ID: [name], male    DOB: November 10, 1945, 67 y.o.   MRN: [id number]  HPI  66 year old patient who has insulin requiring diabetes who presents with a several day history of increasing pain redness and swelling involving the dorsal aspect of the left great toe. No fever chills or other constitutional complaints. Recent hemoglobin A1c 6.2. He has recently recovered from A. acute diarrheal illness.  Past Medical History  Diagnosis Date  . DIABETES MELLITUS, TYPE II 06/24/2007  . HYPERLIPIDEMIA 06/24/2007  . HYPERTENSION 05/01/2007  . Hypoxemia 01/27/2010  . OBESITY 09/02/2008  . OSTEOARTHRITIS 05/01/2007  . 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 OSA (obstructive sleep apnea)   . COPD (chronic obstructive pulmonary disease)   . Asthma     as a child  . Cataract     History   Social History  . Marital Status: Married    Spouse Name: N/A    Number of Children: N/A  . Years of Education: N/A   Occupational History  . Retired    Social History Main Topics  . Smoking status: Former Smoker    Quit date: 10/30/1983  . Smokeless tobacco: Current User    Types: Chew  . Alcohol Use: No  . Drug Use: No  . Sexually Active: Not on file   Other Topics Concern  . Not on file   Social History Narrative   Coffee daily     Past Surgical History  Procedure Laterality Date  . Appendectomy    . Wrist surgery      right  . Shoulder surgery      right  . Back surgery      Family History  Problem Relation Age of Onset  . Aneurysm Mother   . Alcohol abuse Father   . Colon cancer Neg Hx     No Known Allergies  Current Outpatient Prescriptions on File Prior to Visit  Medication Sig Dispense Refill  . aspirin 81 MG tablet Take 81 mg by mouth daily.        . beta carotene w/minerals (OCUVITE) tablet Take 1 tablet by mouth daily.      . cetirizine (ZYRTEC) 5 MG tablet Take 5 mg by mouth daily.        . clotrimazole-betamethasone  (LOTRISONE) cream APPLY TO AFFECTED AREA TWICE DAILY  30 g  5  . glucose blood (FREESTYLE LITE) test strip Use as instructed  100 each  12  . HYDROcodone-acetaminophen (VICODIN) 5-500 MG per tablet Take 1 tablet by mouth 2 (two) times daily.  30 tablet  2  . Lancets (FREESTYLE) lancets Use as instructed  100 each  12  . lisinopril (PRINIVIL,ZESTRIL) 40 MG tablet TAKE ONE TABLET BY MOUTH EVERY DAY  90 tablet  0  . metFORMIN (GLUCOPHAGE) 1000 MG tablet TAKE ONE TABLET BY MOUTH TWICE DAILY  120 tablet  0  . Multiple Vitamin (MULTIVITAMIN) tablet Take 1 tablet by mouth daily.        Marland Kitchen NOVOLIN N RELION 100 UNIT/ML injection INJECT 70 UNITS SUBCUTANEOUSLY TWICE A DAY  50 mL  4  . simvastatin (ZOCOR) 80 MG tablet TAKE ONE TABLET BY MOUTH AT BEDTIME  90 tablet  0  . torsemide (DEMADEX) 20 MG tablet TAKE ONE TABLET BY MOUTH EVERY DAY  90 tablet  0   No current facility-administered medications on file prior to visit.    BP 116/64  Pulse 107  Temp(Src) 97.7 F (36.5  C) (Oral)  Resp 20  Wt 330 lb (149.687 kg)  BMI 50.19 kg/m2  SpO2 98%       Review of Systems  Skin: Positive for rash.       Objective:   Physical Exam  Constitutional:  Obese Nasal cannula oxygen in place Blood pressure low normal Temperature 97.7  Skin:  The dorsal aspect of the left great toe was slightly swollen and erythematous. No significant tenderness or fluctuance The foot was well perfused          Assessment & Plan:

## 2013-01-19 NOTE — Patient Instructions (Signed)
Take your antibiotic as prescribed until ALL of it is gone, but stop if you develop a rash, swelling, or any side effects of the medication.  Contact our office as soon as possible if  there are side effects of the medication.  Cellulitis Cellulitis is an infection of the skin and the tissue beneath it. The infected area is usually red and tender. Cellulitis occurs most often in the arms and lower legs.  CAUSES  Cellulitis is caused by bacteria that enter the skin through cracks or cuts in the skin. The most common types of bacteria that cause cellulitis are Staphylococcus and Streptococcus. SYMPTOMS   Redness and warmth.  Swelling.  Tenderness or pain.  Fever. DIAGNOSIS  Your caregiver can usually determine what is wrong based on a physical exam. Blood tests may also be done. TREATMENT  Treatment usually involves taking an antibiotic medicine. HOME CARE INSTRUCTIONS   Take your antibiotics as directed. Finish them even if you start to feel better.  Keep the infected arm or leg elevated to reduce swelling.  Apply a warm cloth to the affected area up to 4 times per day to relieve pain.  Only take over-the-counter or prescription medicines for pain, discomfort, or fever as directed by your caregiver.  Keep all follow-up appointments as directed by your caregiver. SEEK MEDICAL CARE IF:   You notice red streaks coming from the infected area.  Your red area gets larger or turns dark in color.  Your bone or joint underneath the infected area becomes painful after the skin has healed.  Your infection returns in the same area or another area.  You notice a swollen bump in the infected area.  You develop new symptoms. SEEK IMMEDIATE MEDICAL CARE IF:   You have a fever.  You feel very sleepy.  You develop vomiting or diarrhea.  You have a general ill feeling (malaise) with muscle aches and pains. MAKE SURE YOU:   Understand these instructions.  Will watch your  condition.  Will get help right away if you are not doing well or get worse. Document Released: 07/25/2005 Document Revised: 04/15/2012 Document Reviewed: 12/31/2011 [facility] Patient Information 2013 [place], [place].

## 2013-03-07 ENCOUNTER — Other Ambulatory Visit: Payer: Self-pay | Admitting: Internal Medicine

## 2013-04-01 ENCOUNTER — Other Ambulatory Visit: Payer: Self-pay | Admitting: Internal Medicine

## 2013-04-06 ENCOUNTER — Other Ambulatory Visit: Payer: Self-pay | Admitting: *Deleted

## 2013-04-06 MED ORDER — SIMVASTATIN 80 MG PO TABS: ORAL_TABLET | ORAL | Status: DC

## 2013-04-17 ENCOUNTER — Other Ambulatory Visit: Payer: Self-pay | Admitting: Ophthalmology

## 2013-04-17 NOTE — H&P (Addendum)
Patient Record  [name] [name] Patient Number:  [id number] Date of Birth:  09/02/1946 Age:  67 years old    Gender:  Male Date of Evaluation:  April 17, 2013  Chief Complaint:   The patient is referred for consultataive evaluation for Cataracts. He cannot not see well to watch TV or read. Even with his glasses changed he could not see better. History of Present Illness:   67 yo male  for cataract follow up.  Diabetic x 15 years. His last A1c wac 7.8.  CHO controlled.  No pain or discomfort.  Has some itching.  No pus or mucus. C/O blurred vision.  Has to turn head in order to see tv clearly.  For evaluation.( Reviewed by Doctor: GG) Past History:  Allergies:  NKDA Medications:   Other Medications:  Insulin, Simvastatin, MVI, Metformin, Lisinopril, Zyrtec, Lasix, Etodolac, ASA Birth History:  none Past Ocular History:   Diabetes w/ opthalmic complication intraocular hypertension conjunctivitis cataract Past Medical History:   high blood pressure,  250.01 Diabetes mellitus, insulin dependeb\nt high cholesterol Past Surgical History:   back sx appendectomy  Family History:  no amblyopia, + blindness (mother), + cataracts (mother), no crossed eyes, no diabetic retinopathy, + glaucoma (mother), no macular degeneration, no retinal detachment, + cancer, + diabetes (aunt), + heart disease (grandmother, mother), no high blood pressure, no stroke Social History:   Smoking Status: former smoker    Alcohol:  none   Driving status:  driving  Marital status:  married Review of Systems:  Eyes: + decreased vision  Cardiovascular:  + high blood pressure, + high cholesterol  Endocrine: + diabetes  Allergic/Immunologic: + seasonal allergies All other systems are negative.  Examination:  Visual Acuity:   Distance VA cc:  OD: 20/40    OS: 20/40 IOP:  OD:  18     OS:  16    @ 09:27AM (Goldmann applanation) Manifest Refraction:    Sphere    Cyl Axis       VA         Add       VA Prism  Base R:  [place]  -0.75   90   20/40-       +2.50                      L:  -0.50                20/30       +2.50   AScan:  predicts a +18.50 PC Acrysof MA50BM PC IOL for emmetropia OD                        Confrontation visual field: OU:  Normal  Motility: OU:  Normal  Pupils:  OU:  Shape, size, direct and consensual reaction normal  Adnexa:  Preauricular LN, lacrimal drainage, lacrimal glands, orbit normal  Eyelids: Eyelids:  normal Conjunctiva: OU:  bulbar, palpebral normal  Cornea: OU:  epithelium, stroma, endothelium, tear film normal  Anterior Chamber: OU:  depth normal, no cell, no flare  Iris: OU:  normal Dilation:  OU: Tropicamide 1%/ N 2.5% @ 11:52AM  Lens:  OD:  2+ nuclear sclerosis cataract; 2+ posterior subcapsular cataract OS:  2+ nuclear sclerotic cataract, 1+ posterior subcapsular cataract  Vitreous: OU:  normal  Optic Disc: OU:  cupping: 0.15   Macula: OU:  normal  Vessels: OU:  normal  Periphery:  OD:  normal  OS:  dot and blot hemorrhages   Orientation to person, place and time:  Normal  Mood and affect:  Normal  Impression:  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 I discussed with illustrations how retina lack of circualtion causes low oxygen content and causes vascular changes and causes the retina to release chemicals that make abnormal, weed like, blood vessels grow.   He indicated understanding our discussion and feels that he has improved understanding.  Cataract: We discussed the natural history of Cataracts with illustrations. We discussed the related symptoms , visual significance and when we intervene with surgery. We discussed the surgical techniques used, risks and benefits of surgery.   I have recommended Phaco IOL OD to  correct the symptoms caused by a posterior subcapsular component to his Cataract OD.  He indicated understanding our discussion and felt that his questions had been answered to his satisfaction.  He indicates that hedesires to proceed with Phaco IOL.   Medications:   I caps  pataday Patient Instructions: Please do not eat anything after mignight the day before surgery. Return to clinic:  1 month, sooner PRN   (electronically signed) Shade Flood, MD

## 2013-04-20 ENCOUNTER — Encounter (HOSPITAL_COMMUNITY): Payer: Self-pay | Admitting: Pharmacy Technician

## 2013-04-20 ENCOUNTER — Other Ambulatory Visit: Payer: Self-pay | Admitting: *Deleted

## 2013-04-20 MED ORDER — TORSEMIDE 20 MG PO TABS: ORAL_TABLET | ORAL | Status: DC

## 2013-04-20 MED ORDER — LISINOPRIL 40 MG PO TABS: ORAL_TABLET | ORAL | Status: DC

## 2013-04-24 ENCOUNTER — Encounter (HOSPITAL_COMMUNITY)
Admission: RE | Admit: 2013-04-24 | Discharge: 2013-04-24 | Disposition: A | Discharge status: Home / Self Care | Payer: Medicare Other | Source: Ambulatory Visit | Attending: Ophthalmology | Admitting: Ophthalmology

## 2013-04-24 ENCOUNTER — Encounter (HOSPITAL_COMMUNITY)
Admission: RE | Admit: 2013-04-24 | Discharge: 2013-04-24 | Disposition: A | Discharge status: Home / Self Care | Payer: Medicare Other | Source: Ambulatory Visit | Attending: Anesthesiology | Admitting: Anesthesiology

## 2013-04-24 ENCOUNTER — Encounter (HOSPITAL_COMMUNITY): Payer: Self-pay

## 2013-04-24 HISTORY — DX: Personal history of urinary calculi: Z87.442

## 2013-04-24 LAB — BASIC METABOLIC PANEL
CO2: 25 mEq/L (ref 19–32)
Chloride: 98 mEq/L (ref 96–112)
Sodium: 133 mEq/L — ABNORMAL LOW (ref 135–145)

## 2013-04-24 LAB — CBC
Platelets: 202 10*3/uL (ref 150–400)
RBC: 4.79 MIL/uL (ref 4.22–5.81)
WBC: 8.9 10*3/uL (ref 4.0–10.5)

## 2013-04-24 NOTE — Progress Notes (Signed)
04/24/13 0917  OBSTRUCTIVE SLEEP APNEA  Have you ever been diagnosed with sleep apnea through a sleep study? No  Do you snore loudly (loud enough to be heard through closed doors)?  0  Do you often feel tired, fatigued, or sleepy during the daytime? 1  Has anyone observed you stop breathing during your sleep? 0  Do you have, or are you being treated for high blood pressure? 1  BMI more than 35 kg/m2? 1  Age over 68 years old? 1  Neck circumference greater than 40 cm/18 inches? 1  Obstructive Sleep Apnea Score 5  Score 4 or greater  Results sent to PCP

## 2013-04-24 NOTE — Pre-Procedure Instructions (Addendum)
[name]  04/24/2013   Your procedure is scheduled on: 04/29/13  Report to [facility] Short Stay Center at 1210 pM.  Call this number if you have problems the morning of surgery: [phone]   Remember:   Do not eat food or drink liquids after midnight.   Take these medicines the morning of surgery with A SIP OF WATER: none        STOP aspirin now   Do not wear jewelry, make-up or nail polish.  Do not wear lotions, powders, or perfumes. You may wear deodorant.  Do not shave 48 hours prior to surgery. Men may shave face and neck.  Do not bring valuables to the hospital.  [facility] is not responsible                   for any belongings or valuables.  Contacts, dentures or bridgework may not be worn into surgery.  Leave suitcase in the car. After surgery it may be brought to your room.  For patients admitted to the hospital, checkout time is 11:00 AM the day of  discharge.   Patients discharged the day of surgery will not be allowed to drive  home.  Name and phone number of your driver:   Special Instructions: Shower using CHG 2 nights before surgery and the night before surgery.  If you shower the day of surgery use CHG.  Use special wash - you have one bottle of CHG for all showers.  You should use approximately 1/3 of the bottle for each shower.   Please read over the following fact sheets that you were given: Pain Booklet, Coughing and Deep Breathing and Surgical Site Infection Prevention

## 2013-04-27 NOTE — Progress Notes (Signed)
Anesthesia chart review: Patient is a 67 year old male scheduled for right eye cataract extraction on 04/29/13 by Dr. Clarisa Kindred. Case is posted for MAC anesthesia.  History includes morbid obesity, former smoker, diabetes mellitus type 2, hyperlipidemia, hypertension, osteoarthritis, COPD, childhood asthma, obstructive sleep apnea, nephrolithiasis, CHF/chronic LE edema.  Multiple notes in Epic reviewed with no evidence of prior echo found. There is no known CAD history.  He was last hospitalized in 11/2008 for acute COPD exacerbation. PCP is Dr. Birdie Sons.  By his last office visit on 01/05/13, patient is also on chronic home oxygen.  EKG on 01/11/13 showed SR, right BBB.  The interpreting physician did not feel that it was significantly changed since 12/06/08.    CXR on 04/24/13 showed: Stable cardiomegaly and chronic changes. No new focal or acute abnormality seen.  Preoperative labs noted.  Glucose 143. (A1C on 01/05/13 was 6.2.)  History and EKGs reviewed with anesthesiologist Dr. Chaney Malling who agrees that if no acute cardiopulmonary symptoms or other acute changes then it is likely that he can proceed as planned.  He is morbidly obese with OSA and home oxygen use. He will be evaluated by his assigned anesthesiologist on the day of surgery to discuss the definitive anesthesia plan.  Velna Ochs [facility] Stay Center/Anesthesiology Phone [phone] 04/27/2013 4:39 PM

## 2013-04-28 NOTE — Progress Notes (Signed)
Notified pt. Of time change. Instructed to arrive at 1040.

## 2013-04-29 ENCOUNTER — Ambulatory Visit (HOSPITAL_COMMUNITY): Payer: Medicare Other | Admitting: Anesthesiology

## 2013-04-29 ENCOUNTER — Encounter (HOSPITAL_COMMUNITY): Payer: Self-pay | Admitting: Vascular Surgery

## 2013-04-29 ENCOUNTER — Encounter (HOSPITAL_COMMUNITY)
Admission: RE | Disposition: A | Discharge status: Home / Self Care | Payer: Self-pay | Source: Ambulatory Visit | Attending: Ophthalmology

## 2013-04-29 ENCOUNTER — Ambulatory Visit (HOSPITAL_COMMUNITY)
Admission: RE | Admit: 2013-04-29 | Discharge: 2013-04-29 | Disposition: A | Discharge status: Home / Self Care | Payer: Medicare Other | Source: Ambulatory Visit | Attending: Ophthalmology | Admitting: Ophthalmology

## 2013-04-29 ENCOUNTER — Encounter (HOSPITAL_COMMUNITY): Payer: Self-pay | Admitting: *Deleted

## 2013-04-29 DIAGNOSIS — Z794 Long term (current) use of insulin: Secondary | ICD-10-CM | POA: Insufficient documentation

## 2013-04-29 DIAGNOSIS — J4489 Other specified chronic obstructive pulmonary disease: Secondary | ICD-10-CM | POA: Insufficient documentation

## 2013-04-29 DIAGNOSIS — Z9981 Dependence on supplemental oxygen: Secondary | ICD-10-CM | POA: Insufficient documentation

## 2013-04-29 DIAGNOSIS — J449 Chronic obstructive pulmonary disease, unspecified: Secondary | ICD-10-CM | POA: Insufficient documentation

## 2013-04-29 DIAGNOSIS — Z7982 Long term (current) use of aspirin: Secondary | ICD-10-CM | POA: Insufficient documentation

## 2013-04-29 DIAGNOSIS — E11329 Type 2 diabetes mellitus with mild nonproliferative diabetic retinopathy without macular edema: Secondary | ICD-10-CM | POA: Insufficient documentation

## 2013-04-29 DIAGNOSIS — E7800 Pure hypercholesterolemia, unspecified: Secondary | ICD-10-CM | POA: Insufficient documentation

## 2013-04-29 DIAGNOSIS — H2510 Age-related nuclear cataract, unspecified eye: Secondary | ICD-10-CM | POA: Insufficient documentation

## 2013-04-29 DIAGNOSIS — H40059 Ocular hypertension, unspecified eye: Secondary | ICD-10-CM | POA: Insufficient documentation

## 2013-04-29 DIAGNOSIS — E669 Obesity, unspecified: Secondary | ICD-10-CM | POA: Insufficient documentation

## 2013-04-29 DIAGNOSIS — E1139 Type 2 diabetes mellitus with other diabetic ophthalmic complication: Secondary | ICD-10-CM | POA: Insufficient documentation

## 2013-04-29 DIAGNOSIS — Z79899 Other long term (current) drug therapy: Secondary | ICD-10-CM | POA: Insufficient documentation

## 2013-04-29 DIAGNOSIS — G4730 Sleep apnea, unspecified: Secondary | ICD-10-CM | POA: Insufficient documentation

## 2013-04-29 DIAGNOSIS — H25019 Cortical age-related cataract, unspecified eye: Secondary | ICD-10-CM | POA: Insufficient documentation

## 2013-04-29 HISTORY — PX: CATARACT EXTRACTION W/PHACO: SHX586

## 2013-04-29 LAB — GLUCOSE, CAPILLARY: Glucose-Capillary: 156 mg/dL — ABNORMAL HIGH (ref 70–99)

## 2013-04-29 SURGERY — PHACOEMULSIFICATION, CATARACT, WITH IOL INSERTION
Anesthesia: Monitor Anesthesia Care | Site: Eye | Laterality: Right | Wound class: Clean

## 2013-04-29 MED ORDER — PROVISC 10 MG/ML IO SOLN
INTRAOCULAR | Status: DC | PRN
  Administered 2013-04-29: .85 mL via INTRAOCULAR

## 2013-04-29 MED ORDER — TETRACAINE HCL 0.5 % OP SOLN
2.0000 [drp] | OPHTHALMIC | Status: AC
  Administered 2013-04-29: 2 [drp] via OPHTHALMIC
  Filled 2013-04-29: qty 2

## 2013-04-29 MED ORDER — ACETAZOLAMIDE SODIUM 500 MG IJ SOLR
INTRAMUSCULAR | Status: AC
  Filled 2013-04-29: qty 500

## 2013-04-29 MED ORDER — MIDAZOLAM HCL 5 MG/5ML IJ SOLN
INTRAMUSCULAR | Status: DC | PRN
  Administered 2013-04-29: 2 mg via INTRAVENOUS

## 2013-04-29 MED ORDER — TETRACAINE HCL 0.5 % OP SOLN
OPHTHALMIC | Status: AC
  Filled 2013-04-29: qty 2

## 2013-04-29 MED ORDER — TRIAMCINOLONE ACETONIDE 40 MG/ML IJ SUSP
INTRAMUSCULAR | Status: AC
  Filled 2013-04-29: qty 5

## 2013-04-29 MED ORDER — LIDOCAINE HCL 2 % IJ SOLN
INTRAMUSCULAR | Status: DC | PRN
  Administered 2013-04-29: 13:00:00 via RETROBULBAR

## 2013-04-29 MED ORDER — PHENYLEPHRINE HCL 2.5 % OP SOLN
OPHTHALMIC | Status: AC
  Administered 2013-04-29: 1 [drp] via OPHTHALMIC
  Filled 2013-04-29: qty 2

## 2013-04-29 MED ORDER — SODIUM HYALURONATE 10 MG/ML IO SOLN
INTRAOCULAR | Status: AC
  Filled 2013-04-29: qty 0.85

## 2013-04-29 MED ORDER — ACETAMINOPHEN 10 MG/ML IV SOLN: 1000.0000 mg | Freq: Once | INTRAVENOUS | Status: DC | PRN

## 2013-04-29 MED ORDER — BSS IO SOLN
INTRAOCULAR | Status: AC
  Filled 2013-04-29: qty 500

## 2013-04-29 MED ORDER — DEXAMETHASONE SODIUM PHOSPHATE 10 MG/ML IJ SOLN
INTRAMUSCULAR | Status: DC | PRN
  Administered 2013-04-29: 10 mg

## 2013-04-29 MED ORDER — LIDOCAINE HCL (CARDIAC) 20 MG/ML IV SOLN
INTRAVENOUS | Status: DC | PRN
  Administered 2013-04-29: 100 mg via INTRAVENOUS

## 2013-04-29 MED ORDER — LIDOCAINE HCL 2 % IJ SOLN
INTRAMUSCULAR | Status: AC
  Filled 2013-04-29: qty 20

## 2013-04-29 MED ORDER — CEFAZOLIN SUBCONJUNCTIVAL INJECTION 100 MG/0.5 ML
100.0000 mg | INJECTION | SUBCONJUNCTIVAL | Status: DC
  Filled 2013-04-29: qty 0.5

## 2013-04-29 MED ORDER — HYDROMORPHONE HCL PF 1 MG/ML IJ SOLN: 0.2500 mg | INTRAMUSCULAR | Status: DC | PRN

## 2013-04-29 MED ORDER — NA CHONDROIT SULF-NA HYALURON 40-30 MG/ML IO SOLN
INTRAOCULAR | Status: AC
  Filled 2013-04-29: qty 0.5

## 2013-04-29 MED ORDER — EPINEPHRINE HCL 1 MG/ML IJ SOLN
INTRAMUSCULAR | Status: AC
  Filled 2013-04-29: qty 1

## 2013-04-29 MED ORDER — PREDNISOLONE ACETATE 1 % OP SUSP
OPHTHALMIC | Status: AC
  Filled 2013-04-29: qty 5

## 2013-04-29 MED ORDER — ONDANSETRON HCL 4 MG/2ML IJ SOLN: 4.0000 mg | Freq: Once | INTRAMUSCULAR | Status: DC | PRN

## 2013-04-29 MED ORDER — SODIUM CHLORIDE 0.9 % IV SOLN
INTRAVENOUS | Status: DC | PRN
  Administered 2013-04-29 (×2): via INTRAVENOUS

## 2013-04-29 MED ORDER — DEXAMETHASONE SODIUM PHOSPHATE 10 MG/ML IJ SOLN
INTRAMUSCULAR | Status: AC
  Filled 2013-04-29: qty 1

## 2013-04-29 MED ORDER — CEFAZOLIN SUBCONJUNCTIVAL INJECTION 100 MG/0.5 ML
INJECTION | SUBCONJUNCTIVAL | Status: DC | PRN
  Administered 2013-04-29: 100 mg via SUBCONJUNCTIVAL

## 2013-04-29 MED ORDER — NA CHONDROIT SULF-NA HYALURON 40-30 MG/ML IO SOLN
INTRAOCULAR | Status: DC | PRN
  Administered 2013-04-29: 0.5 mL via INTRAOCULAR

## 2013-04-29 MED ORDER — PREDNISOLONE ACETATE 1 % OP SUSP
1.0000 [drp] | OPHTHALMIC | Status: AC
  Administered 2013-04-29: 1 [drp] via OPHTHALMIC
  Filled 2013-04-29: qty 5

## 2013-04-29 MED ORDER — PHENYLEPHRINE HCL 2.5 % OP SOLN
1.0000 [drp] | OPHTHALMIC | Status: AC | PRN
  Administered 2013-04-29 (×2): 1 [drp] via OPHTHALMIC

## 2013-04-29 MED ORDER — EPINEPHRINE HCL 1 MG/ML IJ SOLN
INTRAOCULAR | Status: DC | PRN
  Administered 2013-04-29: 12:00:00

## 2013-04-29 MED ORDER — HYPROMELLOSE (GONIOSCOPIC) 2.5 % OP SOLN
OPHTHALMIC | Status: DC | PRN
  Administered 2013-04-29: 2 [drp] via OPHTHALMIC

## 2013-04-29 MED ORDER — BACITRACIN-POLYMYXIN B 500-10000 UNIT/GM OP OINT
TOPICAL_OINTMENT | OPHTHALMIC | Status: DC | PRN
  Administered 2013-04-29: 1 via OPHTHALMIC

## 2013-04-29 MED ORDER — BUPIVACAINE HCL (PF) 0.75 % IJ SOLN
INTRAMUSCULAR | Status: AC
  Filled 2013-04-29: qty 10

## 2013-04-29 MED ORDER — BSS IO SOLN
INTRAOCULAR | Status: DC | PRN
  Administered 2013-04-29: 15 mL via INTRAOCULAR

## 2013-04-29 MED ORDER — HYPROMELLOSE (GONIOSCOPIC) 2.5 % OP SOLN
OPHTHALMIC | Status: AC
  Filled 2013-04-29: qty 15

## 2013-04-29 MED ORDER — SODIUM CHLORIDE 0.9 % IV SOLN
INTRAVENOUS | Status: DC
  Administered 2013-04-29: 12:00:00 via INTRAVENOUS

## 2013-04-29 MED ORDER — PROPOFOL 10 MG/ML IV BOLUS
INTRAVENOUS | Status: DC | PRN
  Administered 2013-04-29: 30 mg via INTRAVENOUS

## 2013-04-29 MED ORDER — BACITRACIN-POLYMYXIN B 500-10000 UNIT/GM OP OINT
TOPICAL_OINTMENT | OPHTHALMIC | Status: AC
  Filled 2013-04-29: qty 3.5

## 2013-04-29 MED ORDER — GATIFLOXACIN 0.5 % OP SOLN
1.0000 [drp] | OPHTHALMIC | Status: AC | PRN
  Administered 2013-04-29 (×3): 1 [drp] via OPHTHALMIC
  Filled 2013-04-29: qty 2.5

## 2013-04-29 MED ORDER — FENTANYL CITRATE 0.05 MG/ML IJ SOLN
INTRAMUSCULAR | Status: DC | PRN
  Administered 2013-04-29: 25 ug via INTRAVENOUS
  Administered 2013-04-29: 50 ug via INTRAVENOUS

## 2013-04-29 SURGICAL SUPPLY — 60 items
APPLICATOR COTTON TIP 6IN STRL (MISCELLANEOUS) ×2 IMPLANT
APPLICATOR DR MATTHEWS STRL (MISCELLANEOUS) ×2 IMPLANT
BAG MINI COLL DRAIN (WOUND CARE) IMPLANT
BLADE EYE MINI 60D BEAVER (BLADE) IMPLANT
BLADE KERATOME 2.75 (BLADE) ×2 IMPLANT
BLADE STAB KNIFE 15DEG (BLADE) IMPLANT
CANNULA ANTERIOR CHAMBER 27GA (MISCELLANEOUS) ×2 IMPLANT
CLOTH BEACON ORANGE TIMEOUT ST (SAFETY) ×2 IMPLANT
DRAPE OPHTHALMIC 77X100 STRL (CUSTOM PROCEDURE TRAY) ×2 IMPLANT
DRAPE POUCH INSTRU U-SHP 10X18 (DRAPES) ×2 IMPLANT
DRSG TEGADERM 4X4.75 (GAUZE/BANDAGES/DRESSINGS) ×2 IMPLANT
FILTER BLUE MILLIPORE (MISCELLANEOUS) IMPLANT
GLOVE OPTIFIT SS 6.5 STRL BRWN (GLOVE) ×2 IMPLANT
GLOVE SS BIOGEL STRL SZ 6.5 (GLOVE) ×2 IMPLANT
GLOVE SUPERSENSE BIOGEL SZ 6.5 (GLOVE) ×2
GLOVE SURG SS PI 6.0 STRL IVOR (GLOVE) ×2 IMPLANT
GOWN SRG XL XLNG 56XLVL 4 (GOWN DISPOSABLE) ×1 IMPLANT
GOWN STRL NON-REIN LRG LVL3 (GOWN DISPOSABLE) ×4 IMPLANT
GOWN STRL NON-REIN XL XLG LVL4 (GOWN DISPOSABLE) ×1
KIT BASIN OR (CUSTOM PROCEDURE TRAY) ×2 IMPLANT
KIT ROOM TURNOVER OR (KITS) IMPLANT
KNIFE GRIESHABER SHARP 2.5MM (MISCELLANEOUS) ×2 IMPLANT
LENS IOL ACRYSOF MP POST 18.5 (Intraocular Lens) ×2 IMPLANT
MASK EYE SHIELD (GAUZE/BANDAGES/DRESSINGS) ×2 IMPLANT
NEEDLE 18GX1X1/2 (RX/OR ONLY) (NEEDLE) IMPLANT
NEEDLE 21 GA WING INFUSION (NEEDLE) IMPLANT
NEEDLE 22X1 1/2 (OR ONLY) (NEEDLE) ×2 IMPLANT
NEEDLE 25GX 5/8IN NON SAFETY (NEEDLE) ×4 IMPLANT
NEEDLE FILTER BLUNT 18X 1/2SAF (NEEDLE) ×1
NEEDLE FILTER BLUNT 18X1 1/2 (NEEDLE) ×1 IMPLANT
NEEDLE HYPO 30X.5 LL (NEEDLE) ×4 IMPLANT
NS IRRIG 1000ML POUR BTL (IV SOLUTION) ×2 IMPLANT
PACK CATARACT CUSTOM (CUSTOM PROCEDURE TRAY) ×2 IMPLANT
PACK CATARACT MCHSCP (PACKS) ×2 IMPLANT
PACK COMBINED CATERACT/VIT 23G (OPHTHALMIC RELATED) IMPLANT
PAD ARMBOARD 7.5X6 YLW CONV (MISCELLANEOUS) ×4 IMPLANT
PAD EYE OVAL STERILE LF (GAUZE/BANDAGES/DRESSINGS) ×2 IMPLANT
PROBE ANTERIOR 20G W/INFUS NDL (MISCELLANEOUS) IMPLANT
RING MALYGIN (MISCELLANEOUS) IMPLANT
ROLLS DENTAL (MISCELLANEOUS) IMPLANT
SHUTTLE MONARCH TYPE A (NEEDLE) ×2 IMPLANT
SPEAR EYE SURG WECK-CEL (MISCELLANEOUS) IMPLANT
SUT ETHILON 10-0 CS-B-6CS-B-6 (SUTURE)
SUT ETHILON 5 0 P 3 18 (SUTURE)
SUT ETHILON 9 0 TG140 8 (SUTURE) IMPLANT
SUT NYLON ETHILON 5-0 P-3 1X18 (SUTURE) IMPLANT
SUT PLAIN 6 0 TG1408 (SUTURE) IMPLANT
SUT POLY NON ABSORB 10-0 8 STR (SUTURE) IMPLANT
SUT VICRYL 6 0 S 29 12 (SUTURE) IMPLANT
SUTURE EHLN 10-0 CS-B-6CS-B-6 (SUTURE) IMPLANT
SYR 20CC LL (SYRINGE) IMPLANT
SYR 5ML LL (SYRINGE) IMPLANT
SYR TB 1ML LUER SLIP (SYRINGE) ×2 IMPLANT
SYRINGE 10CC LL (SYRINGE) IMPLANT
TAPE PAPER MEDFIX 1IN X 10YD (GAUZE/BANDAGES/DRESSINGS) ×2 IMPLANT
TIP ABS 45DEG FLARED 0.9MM (TIP) ×2 IMPLANT
TOWEL OR 17X24 6PK STRL BLUE (TOWEL DISPOSABLE) IMPLANT
WATER STERILE IRR 1000ML POUR (IV SOLUTION) ×2 IMPLANT
WIPE INSTRUMENT ADHESIVE BACK (MISCELLANEOUS) ×2 IMPLANT
WIPE INSTRUMENT VISIWIPE 73X73 (MISCELLANEOUS) ×2 IMPLANT

## 2013-04-29 NOTE — H&P (View-Only) (Signed)
Patient Record  [name], [name] Patient Number:  [id number] Date of Birth:  [birth year] Age:  67 years old    Gender:  Male Date of Evaluation:  April 17, 2013  Chief Complaint:   The patient is referred for consultataive evaluation for Cataracts. He cannot not see well to watch TV or read. Even with his glasses changed he could not see better. History of Present Illness:   67 yo male  for cataract follow up.  Diabetic x 15 years. His last A1c wac 7.8.  CHO controlled.  No pain or discomfort.  Has some itching.  No pus or mucus. C/O blurred vision.  Has to turn head in order to see tv clearly.  For evaluation.( Reviewed by Doctor: GG) Past History:  Allergies:  NKDA Medications:   Other Medications:  Insulin, Simvastatin, MVI, Metformin, Lisinopril, Zyrtec, Lasix, Etodolac, ASA Birth History:  none Past Ocular History:   Diabetes w/ opthalmic complication intraocular hypertension conjunctivitis cataract Past Medical History:   high blood pressure,  250.01 Diabetes mellitus, insulin dependeb\nt high cholesterol Past Surgical History:   back sx appendectomy  Family History:  no amblyopia, + blindness (mother), + cataracts (mother), no crossed eyes, no diabetic retinopathy, + glaucoma (mother), no macular degeneration, no retinal detachment, + cancer, + diabetes (aunt), + heart disease (grandmother, mother), no high blood pressure, no stroke Social History:   Smoking Status: former smoker    Alcohol:  none   Driving status:  driving  Marital status:  married Review of Systems:  Eyes: + decreased vision  Cardiovascular:  + high blood pressure, + high cholesterol  Endocrine: + diabetes  Allergic/Immunologic: + seasonal allergies All other systems are negative.  Examination:  Visual Acuity:   Distance VA cc:  OD: 20/40    OS: 20/40 IOP:  OD:  18     OS:  16    @ 09:27AM (Goldmann applanation) Manifest Refraction:    Sphere    Cyl Axis       VA         Add       VA Prism  Base R:  [place]  -0.75   90   20/40-       +2.50                      L:  -0.50                20/30       +2.50   AScan:  predicts a +18.50 PC Acrysof MA50BM PC IOL for emmetropia OD                        Confrontation visual field: OU:  Normal  Motility: OU:  Normal  Pupils:  OU:  Shape, size, direct and consensual reaction normal  Adnexa:  Preauricular LN, lacrimal drainage, lacrimal glands, orbit normal  Eyelids: Eyelids:  normal Conjunctiva: OU:  bulbar, palpebral normal  Cornea: OU:  epithelium, stroma, endothelium, tear film normal  Anterior Chamber: OU:  depth normal, no cell, no flare  Iris: OU:  normal Dilation:  OU: Tropicamide 1%/ N 2.5% @ 11:52AM  Lens:  OD:  2+ nuclear sclerosis cataract; 2+ posterior subcapsular cataract OS:  2+ nuclear sclerotic cataract, 1+ posterior subcapsular cataract  Vitreous: OU:  normal  Optic Disc: OU:  cupping: 0.15   Macula: OU:  normal  Vessels: OU:  normal  Periphery:  OD:  normal  OS:  dot and blot hemorrhages   Orientation to person, place and time:  Normal  Mood and affect:  Normal  Impression:  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 I discussed with illustrations how retina lack of circualtion causes low oxygen content and causes vascular changes and causes the retina to release chemicals that make abnormal, weed like, blood vessels grow.   He indicated understanding our discussion and feels that he has improved understanding.  Cataract: We discussed the natural history of Cataracts with illustrations. We discussed the related symptoms , visual significance and when we intervene with surgery. We discussed the surgical techniques used, risks and benefits of surgery.   I have recommended Phaco IOL OD to  correct the symptoms caused by a posterior subcapsular component to his Cataract OD.  He indicated understanding our discussion and felt that his questions had been answered to his satisfaction.  He indicates that hedesires to proceed with Phaco IOL.   Medications:   I caps  pataday Patient Instructions: Please do not eat anything after mignight the day before surgery. Return to clinic:  1 month, sooner PRN   (electronically signed) Shade Flood, MD

## 2013-04-29 NOTE — Transfer of Care (Signed)
Immediate Anesthesia Transfer of Care Note  Patient: [name]  Procedure(s) Performed: Procedure(s): CATARACT EXTRACTION PHACO AND INTRAOCULAR LENS PLACEMENT (IOC) (Right)  Patient Location: Short Stay  Anesthesia Type:MAC  Level of Consciousness: awake, alert  and oriented  Airway & Oxygen Therapy: Patient Spontanous Breathing and Patient connected to nasal cannula oxygen  Post-op Assessment: Report given to PACU RN and Post -op Vital signs reviewed and stable  Post vital signs: Reviewed and stable  Complications: No apparent anesthesia complications

## 2013-04-29 NOTE — Preoperative (Signed)
Beta Blockers   Reason not to administer Beta Blockers:Not Applicable 

## 2013-04-29 NOTE — Progress Notes (Signed)
Pt was asked several times to remove underwear prior to surgery. Pt also observed rubbing eye vigorously after eye drops instilled to spite being instructed to only blot.

## 2013-04-29 NOTE — Interval H&P Note (Signed)
History and Physical Interval Note:  04/29/2013 11:46 AM  [name]  has presented today for surgery, with the diagnosis of Combined Cataract OD  The various methods of treatment have been discussed with the patient and family. After consideration of risks, benefits and other options for treatment, the patient has consented to  Procedure(s): CATARACT EXTRACTION PHACO AND INTRAOCULAR LENS PLACEMENT (IOC) (Right) as a surgical intervention .  The patient's history has been reviewed, patient examined, no change in status, stable for surgery.  I have reviewed the patient's chart and labs.  Questions were answered to the patient's satisfaction.     Jannett Schmall, Waynette Buttery

## 2013-04-29 NOTE — Anesthesia Preprocedure Evaluation (Addendum)
Anesthesia Evaluation  Patient identified by MRN, date of birth, ID band Patient awake    Reviewed: Allergy & Precautions, H&P , NPO status , Patient's Chart, lab work & pertinent test results  Airway Mallampati: II TM Distance: >3 FB     Dental  (+) Edentulous Upper, Edentulous Lower and Dental Advisory Given   Pulmonary asthma , sleep apnea and Oxygen sleep apnea , COPD oxygen dependent,  breath sounds clear to auscultation        Cardiovascular hypertension, Pt. on medications Rhythm:Regular Rate:Normal     Neuro/Psych negative neurological ROS  negative psych ROS   GI/Hepatic negative GI ROS, Neg liver ROS,   Endo/Other  diabetes, Well Controlled, Insulin Dependent and Oral Hypoglycemic Agents  Renal/GU negative Renal ROS     Musculoskeletal negative musculoskeletal ROS (+)   Abdominal (+) + obese,   Peds  Hematology negative hematology ROS (+)   Anesthesia Other Findings   Reproductive/Obstetrics negative OB ROS                         Anesthesia Physical Anesthesia Plan  ASA: III  Anesthesia Plan: MAC   Post-op Pain Management:    Induction: Intravenous  Airway Management Planned: Natural Airway and Nasal Cannula  Additional Equipment:   Intra-op Plan:   Post-operative Plan:   Informed Consent: I have reviewed the patients History and Physical, chart, labs and discussed the procedure including the risks, benefits and alternatives for the proposed anesthesia with the patient or authorized representative who has indicated his/her understanding and acceptance.     Plan Discussed with: CRNA and Anesthesiologist  Anesthesia Plan Comments: (Cataract R. Eye Obesity Htn Type 2 DM glucose 156 Sleep apnea COPD  Plan MAC  Kipp Brood, MD)        Anesthesia Quick Evaluation

## 2013-04-29 NOTE — Op Note (Signed)
[name] 04/29/2013 Cataract: Combined, Nuclear  Procedure: Phacoemulsification, Posterior Chamber Intra-ocular Lens Operative Eye:  right eye  Surgeon: Shade Flood Estimated Blood Loss: minimal Specimens for Pathology:  None Complications: none  The patient was prepared and draped in the usual manner for ocular surgery on the right eye. A Cook lid speculum was placed. A peripheral clear corneal incision was made at the surgical limbus centered at the 11:00 meridian. A separate clear corneal stab incision was made with a 15 degree blade at the 2:00 meridian to permit bi-manual technique. Viscoat and  Provisc as an underlying layer next to the capsule was instilled into the anterior chamber through that incision.  A keratome was used to create a self sealing incision entering the anterior chamber at the 11:00 meridian. A capsulorhexis was performed using a bent 25g needle. The lens was hydrodissected and the nucleus was hydrodilineated using a Nichammin cannula. The Chang chopper was inserted and used to rotate the lens to insure adequate lens mobility. The phacoemulsification handpiece was inserted and a combined phaco-chop technique was employed, fracturing the lens into separate sections with subsequent removal with the phaco handpiece.   The I/A cannula was used to remove remaining lens cortex. Provisc was instilled and used to deepen the anterior chamber and posterior capsule bag. The Monarch injector was used to place a folded Acrysof MA50BM PC IOL, + 18.50  diopters, into the capsule bag. A Sinskey lens hook was used to dial in the trailing haptic.  The I/A cannula was used to remove the viscoelastic from the anterior chamber. BSS was used to bring IOP to the desired range and the wound was checked to insure it was watertight. Subconjunctival injections of Ancef 100/0.10ml and Dexamethasone 0.5 ml of a 10mg /78ml solution were placed without complication. The lid speculum and drapes were  removed and the patient's eye was patched with Polymixin/Bacitracin ophthalmic ointment. An eye shield was placed and the patient was transferred alert and conversant from the operating room to the post-operative recovery area.   Shade Flood, MD

## 2013-04-29 NOTE — Anesthesia Postprocedure Evaluation (Signed)
  Anesthesia Post-op Note  Patient: [name]  Procedure(s) Performed: Procedure(s): CATARACT EXTRACTION PHACO AND INTRAOCULAR LENS PLACEMENT (IOC) (Right)  Patient Location: PACU  Anesthesia Type:MAC  Level of Consciousness: awake, alert  and oriented  Airway and Oxygen Therapy: Patient Spontanous Breathing  Post-op Pain: none  Post-op Assessment: Post-op Vital signs reviewed, Patient's Cardiovascular Status Stable, Respiratory Function Stable and Patent Airway  Post-op Vital Signs: stable  Complications: No apparent anesthesia complications

## 2013-05-05 ENCOUNTER — Encounter (HOSPITAL_COMMUNITY): Payer: Self-pay | Admitting: Ophthalmology

## 2013-05-05 ENCOUNTER — Other Ambulatory Visit: Payer: Self-pay | Admitting: *Deleted

## 2013-05-05 MED ORDER — METFORMIN HCL 1000 MG PO TABS: 1000.0000 mg | ORAL_TABLET | Freq: Two times a day (BID) | ORAL | Status: DC

## 2013-05-15 ENCOUNTER — Telehealth: Payer: Self-pay | Admitting: Internal Medicine

## 2013-05-15 MED ORDER — GLUCOSE BLOOD VI STRP: 1.0000 | ORAL_STRIP | Freq: Three times a day (TID) | Status: DC

## 2013-05-15 MED ORDER — FREESTYLE LANCETS MISC: 1.0000 | Freq: Three times a day (TID) | Status: DC

## 2013-05-15 NOTE — Addendum Note (Signed)
Addended by: Alfred Levins D on: 05/15/2013 10:14 AM   Modules accepted: Orders

## 2013-05-15 NOTE — Telephone Encounter (Signed)
rx corrected and sent in electronically 

## 2013-05-15 NOTE — Telephone Encounter (Signed)
PT called and stated that his RX of glucose blood (FREESTYLE LITE) test strip, is incorrect. The amount of times that he test is incorrect. The Lancets (FREESTYLE) RX is also incorrect. His RX states that he test once a day, but he test 3 times a day. Please assist.

## 2013-07-08 ENCOUNTER — Ambulatory Visit: Payer: Medicare Other | Admitting: Internal Medicine

## 2013-07-22 ENCOUNTER — Ambulatory Visit: Payer: Medicare Other | Admitting: Internal Medicine

## 2013-07-23 ENCOUNTER — Encounter: Payer: Self-pay | Admitting: Internal Medicine

## 2013-07-23 ENCOUNTER — Ambulatory Visit (INDEPENDENT_AMBULATORY_CARE_PROVIDER_SITE_OTHER): Payer: Medicare Other | Admitting: Internal Medicine

## 2013-07-23 VITALS — BP 140/72 | HR 90 | Temp 98.4°F | Ht 69.0 in | Wt 331.0 lb

## 2013-07-23 DIAGNOSIS — E119 Type 2 diabetes mellitus without complications: Secondary | ICD-10-CM

## 2013-07-23 DIAGNOSIS — Z23 Encounter for immunization: Secondary | ICD-10-CM

## 2013-07-23 DIAGNOSIS — E1169 Type 2 diabetes mellitus with other specified complication: Secondary | ICD-10-CM

## 2013-07-23 DIAGNOSIS — E785 Hyperlipidemia, unspecified: Secondary | ICD-10-CM

## 2013-07-23 DIAGNOSIS — E669 Obesity, unspecified: Secondary | ICD-10-CM

## 2013-07-23 DIAGNOSIS — I5040 Unspecified combined systolic (congestive) and diastolic (congestive) heart failure: Secondary | ICD-10-CM

## 2013-07-23 DIAGNOSIS — I10 Essential (primary) hypertension: Secondary | ICD-10-CM

## 2013-07-23 LAB — LIPID PANEL
Total CHOL/HDL Ratio: 4
VLDL: 82.2 mg/dL — ABNORMAL HIGH (ref 0.0–40.0)

## 2013-07-23 LAB — BASIC METABOLIC PANEL
BUN: 21 mg/dL (ref 6–23)
CO2: 29 mEq/L (ref 19–32)
Chloride: 103 mEq/L (ref 96–112)
Glucose, Bld: 144 mg/dL — ABNORMAL HIGH (ref 70–99)
Potassium: 4.8 mEq/L (ref 3.5–5.1)

## 2013-07-23 LAB — HEPATIC FUNCTION PANEL
Alkaline Phosphatase: 37 U/L — ABNORMAL LOW (ref 39–117)
Bilirubin, Direct: 0 mg/dL (ref 0.0–0.3)

## 2013-07-23 LAB — LDL CHOLESTEROL, DIRECT: Direct LDL: 81.5 mg/dL

## 2013-07-23 LAB — MICROALBUMIN / CREATININE URINE RATIO: Microalb Creat Ratio: 3.2 mg/g (ref 0.0–30.0)

## 2013-07-23 NOTE — Progress Notes (Signed)
patient comes in for followup of multiple medical problems including type 2 diabetes, hyperlipidemia, hypertension. The patient does not check blood sugar or blood pressure at home. The patetient does not follow an exercise or diet program. The patient denies any polyuria, polydipsia.  In the past the patient has gone to diabetic treatment center. The patient is tolerating medications  Without difficulty. The patient does admit to medication compliance.   Past Medical History  Diagnosis Date  . DIABETES MELLITUS, TYPE II 06/24/2007  . HYPERLIPIDEMIA 06/24/2007  . HYPERTENSION 05/01/2007  . Hypoxemia 01/27/2010  . OBESITY 09/02/2008  . OSTEOARTHRITIS 05/01/2007  . UNSPEC COMBINED SYSTOLIC&DIASTOLIC HEART FAILURE 02/25/2009    pt denies  . COPD (chronic obstructive pulmonary disease)   . Asthma     as a child  . Cataract   . OSA (obstructive sleep apnea)     not tested  . History of kidney stones     History   Social History  . Marital Status: Married    Spouse Name: N/A    Number of Children: N/A  . Years of Education: N/A   Occupational History  . Retired    Social History Main Topics  . Smoking status: Former Smoker -- 1.50 packs/day for 30 years    Types: Cigarettes    Quit date: 10/30/1983  . Smokeless tobacco: Current User    Types: Chew  . Alcohol Use: No  . Drug Use: No  . Sexual Activity: Not on file   Other Topics Concern  . Not on file   Social History Narrative   Coffee daily     Past Surgical History  Procedure Laterality Date  . Appendectomy    . Wrist surgery      right fx  . Shoulder surgery      right fx  . Back surgery    . Carpal tunnel release Left   . Cataract extraction w/phaco Right 04/29/2013    Procedure: CATARACT EXTRACTION PHACO AND INTRAOCULAR LENS PLACEMENT (IOC);  Surgeon: Shade Flood, MD;  Location: [facility] OR;  Service: Ophthalmology;  Laterality: Right;    Family History  Problem Relation Age of Onset  . Aneurysm Mother   . Alcohol  abuse Father   . Colon cancer Neg Hx     No Known Allergies  Current Outpatient Prescriptions on File Prior to Visit  Medication Sig Dispense Refill  . aspirin 81 MG tablet Take 81 mg by mouth daily.        . beta carotene w/minerals (OCUVITE) tablet Take 1 tablet by mouth daily.      . cetirizine (ZYRTEC) 10 MG tablet Take 10 mg by mouth daily.      . clotrimazole-betamethasone (LOTRISONE) cream Apply 1 application topically 2 (two) times daily as needed (for itching).      Marland Kitchen glucose blood (FREESTYLE LITE) test strip 1 each by Other route 3 (three) times daily. Use as instructed  100 each  11  . insulin NPH (HUMULIN N,NOVOLIN N) 100 UNIT/ML injection Inject 70 Units into the skin 2 (two) times daily. "Novolin N Relion"      . insulin regular (NOVOLIN R,HUMULIN R) 100 units/mL injection Inject 32 Units into the skin 3 (three) times daily before meals.      . Lancets (FREESTYLE) lancets 1 each by Other route 3 (three) times daily. Use as instructed  100 each  11  . lisinopril (PRINIVIL,ZESTRIL) 40 MG tablet Take 40 mg by mouth daily.      Marland Kitchen  metFORMIN (GLUCOPHAGE) 1000 MG tablet Take 1 tablet (1,000 mg total) by mouth 2 (two) times daily with a meal.  60 tablet  5  . simvastatin (ZOCOR) 80 MG tablet Take 80 mg by mouth at bedtime.      . torsemide (DEMADEX) 20 MG tablet Take 20 mg by mouth daily.       No current facility-administered medications on file prior to visit.     patient denies chest pain, shortness of breath, orthopnea. Denies lower extremity edema, abdominal pain, change in appetite, change in bowel movements. Patient denies rashes, musculoskeletal complaints. No other specific complaints in a complete review of systems.   BP 140/72  Pulse 90  Temp(Src) 98.4 F (36.9 C) (Oral)  Ht 5\' 9"  (1.753 m)  Wt 331 lb (150.141 kg)  BMI 48.86 kg/m2  SpO2 95%  well-developed well-nourished male in no acute distress. HEENT exam atraumatic, normocephalic, neck supple without jugular  venous distention. Chest clear to auscultation cardiac exam S1-S2 are regular. Abdominal exam overweight with bowel sounds, soft and nontender. Extremities no edema. Neurologic exam is alert with a normal gait.

## 2013-07-25 NOTE — Assessment & Plan Note (Signed)
He needs to lose weight He is not willing to change his lifestyle at all

## 2013-07-25 NOTE — Assessment & Plan Note (Signed)
This is his main problem He refuses to change his diet or exercise

## 2013-07-25 NOTE — Assessment & Plan Note (Signed)
Check labs today.

## 2013-07-25 NOTE — Assessment & Plan Note (Signed)
i have reiewedchart i'm not sure how this dx was made No EF recorded Pt has coninued SOB-- check echo

## 2013-07-25 NOTE — Assessment & Plan Note (Signed)
BP Readings from Last 3 Encounters:  07/23/13 140/72  04/29/13 122/64  04/29/13 122/64   Controlled Continue same meds

## 2013-08-10 ENCOUNTER — Other Ambulatory Visit (HOSPITAL_COMMUNITY): Payer: Self-pay | Admitting: Internal Medicine

## 2013-08-10 ENCOUNTER — Ambulatory Visit (HOSPITAL_COMMUNITY): Payer: Medicare Other | Attending: Cardiology | Admitting: Radiology

## 2013-08-10 DIAGNOSIS — I5040 Unspecified combined systolic (congestive) and diastolic (congestive) heart failure: Secondary | ICD-10-CM | POA: Insufficient documentation

## 2013-08-10 DIAGNOSIS — I509 Heart failure, unspecified: Secondary | ICD-10-CM

## 2013-08-10 NOTE — Progress Notes (Signed)
Echocardiogram performed.  

## 2013-08-13 ENCOUNTER — Telehealth: Payer: Self-pay | Admitting: Internal Medicine

## 2013-08-13 ENCOUNTER — Telehealth: Payer: Self-pay | Admitting: *Deleted

## 2013-08-13 DIAGNOSIS — R0602 Shortness of breath: Secondary | ICD-10-CM

## 2013-08-13 NOTE — Telephone Encounter (Signed)
See result note.  

## 2013-08-13 NOTE — Telephone Encounter (Signed)
Pt would like results of echocardiogram done Mon. 10/13. Pt has several questions and would like someone to call asap.

## 2013-08-13 NOTE — Telephone Encounter (Signed)
Knuckles ache and numbness in hands and wants to know if the knuckle pain is related to his carpal tunnel.  He has SOB on exertion and is already on oxygen.  Pt is upset and feels like none of his problems were addressed.  He wanted to know what the next step is if his heart is fine.  He stated he does not have a pulmonologist.  Please advise

## 2013-08-14 NOTE — Telephone Encounter (Signed)
Knuckle pain- try tylenol with each meal 650 mg  His SOB is lidely multifactorial-- weight is the biggest contributor- However, his SOB could be related to lungs or heart- refer to cardiolgoy for SOB (may need a stress test)

## 2013-08-14 NOTE — Telephone Encounter (Signed)
Pt aware, referral placed 

## 2013-08-25 ENCOUNTER — Ambulatory Visit: Payer: Medicare Other | Admitting: Cardiology

## 2013-09-01 ENCOUNTER — Other Ambulatory Visit: Payer: Self-pay | Admitting: Internal Medicine

## 2013-09-14 ENCOUNTER — Encounter (INDEPENDENT_AMBULATORY_CARE_PROVIDER_SITE_OTHER): Payer: Self-pay

## 2013-09-14 ENCOUNTER — Ambulatory Visit (INDEPENDENT_AMBULATORY_CARE_PROVIDER_SITE_OTHER): Payer: Medicare Other | Admitting: Cardiology

## 2013-09-14 ENCOUNTER — Encounter: Payer: Self-pay | Admitting: Cardiology

## 2013-09-14 VITALS — BP 136/78 | HR 101 | Ht 68.0 in | Wt 324.0 lb

## 2013-09-14 DIAGNOSIS — J4489 Other specified chronic obstructive pulmonary disease: Secondary | ICD-10-CM

## 2013-09-14 DIAGNOSIS — I509 Heart failure, unspecified: Secondary | ICD-10-CM

## 2013-09-14 DIAGNOSIS — E669 Obesity, unspecified: Secondary | ICD-10-CM

## 2013-09-14 DIAGNOSIS — R0609 Other forms of dyspnea: Secondary | ICD-10-CM

## 2013-09-14 DIAGNOSIS — I5032 Chronic diastolic (congestive) heart failure: Secondary | ICD-10-CM

## 2013-09-14 DIAGNOSIS — I10 Essential (primary) hypertension: Secondary | ICD-10-CM

## 2013-09-14 DIAGNOSIS — R0602 Shortness of breath: Secondary | ICD-10-CM

## 2013-09-14 DIAGNOSIS — J449 Chronic obstructive pulmonary disease, unspecified: Secondary | ICD-10-CM

## 2013-09-14 DIAGNOSIS — E7800 Pure hypercholesterolemia, unspecified: Secondary | ICD-10-CM

## 2013-09-14 DIAGNOSIS — E785 Hyperlipidemia, unspecified: Secondary | ICD-10-CM

## 2013-09-14 LAB — BRAIN NATRIURETIC PEPTIDE: Pro B Natriuretic peptide (BNP): 9 pg/mL (ref 0.0–100.0)

## 2013-09-14 MED ORDER — ATORVASTATIN CALCIUM 40 MG PO TABS: 40.0000 mg | ORAL_TABLET | Freq: Every day | ORAL | Status: DC

## 2013-09-14 MED ORDER — DILTIAZEM HCL ER COATED BEADS 120 MG PO CP24: 120.0000 mg | ORAL_CAPSULE | Freq: Every day | ORAL | Status: DC

## 2013-09-14 NOTE — Patient Instructions (Addendum)
Your physician has requested that you have a lexiscan myoview. For further information please visit [email or web address] Please follow instruction sheet, as given.  Your physician recommends that you have lab work today: BNP  Your physician has recommended you make the following change in your medication: START Cardizem CD 120mg  take one by mouth daily, STOP Simvastatin, START Atorvastatin 40mg  take one by mouth every evening  Your physician recommends that you return for a FASTING LIPID Profile and ALT in 6 WEEKS--nothing to eat or drink after midnight, lab opens at 7:30 am  Your physician recommends that you schedule a follow-up appointment in: 3 WEEKS with Dr Mayford Knife

## 2013-09-14 NOTE — Progress Notes (Signed)
[address] [place], [place]  [address] Phone: [phone] Fax:  [phone]  Date:  09/14/2013   ID:  [name], DOB 07-10-1946, MRN [id number]  PCP:  Judie Petit, MD  Cardiologist:  Armanda Magic, MD     History of Present Illness: [name] is a 67 y.o. male with a history of O2 dependent COPD, HTN, dyslipidemia, morbid obesity who presents today for evaluation.  He has been having increased SOB over the past few months. He denies any chest pain.  He is able to walk around the house but cannot go shopping without using an electric cart.  He has to sleep 50% of the night in a chair.  He wakes up around 2-4am with SOB and has to go sleep in the chair.  He has chronic LE edema which is controlled with TED hose stockings and diuretic.  He denies any palpitations, dizziness or syncope.  He thinks he has OSA but says he does not have the money to pay for it. 2D echo showed normal LVF with moderate LVF and diastolic dysfunction.   Wt Readings from Last 3 Encounters:  09/14/13 324 lb (146.965 kg)  07/23/13 331 lb (150.141 kg)  04/24/13 329 lb 2.4 oz (149.3 kg)     Past Medical History  Diagnosis Date  . DIABETES MELLITUS, TYPE II 06/24/2007  . HYPERLIPIDEMIA 06/24/2007  . HYPERTENSION 05/01/2007  . Hypoxemia 01/27/2010  . OBESITY 09/02/2008  . OSTEOARTHRITIS 05/01/2007  . UNSPEC COMBINED SYSTOLIC&DIASTOLIC HEART FAILURE 02/25/2009    pt denies  . COPD (chronic obstructive pulmonary disease)   . Asthma     as a child  . Cataract   . OSA (obstructive sleep apnea)     not tested  . History of kidney stones     Current Outpatient Prescriptions  Medication Sig Dispense Refill  . aspirin 81 MG tablet Take 81 mg by mouth daily.        . beta carotene w/minerals (OCUVITE) tablet Take 1 tablet by mouth daily.      . cetirizine (ZYRTEC) 10 MG tablet Take 10 mg by mouth daily.      . clotrimazole-betamethasone (LOTRISONE) cream Apply 1 application topically 2  (two) times daily as needed (for itching).      Marland Kitchen glucose blood (FREESTYLE LITE) test strip 1 each by Other route 3 (three) times daily. Use as instructed  100 each  11  . HYDROcodone-acetaminophen (VICODIN) 5-500 MG per tablet Take 1 tablet by mouth daily as needed for pain.      Marland Kitchen insulin NPH (HUMULIN N,NOVOLIN N) 100 UNIT/ML injection Inject 70 Units into the skin 2 (two) times daily. "Novolin N Relion"      . insulin regular (NOVOLIN R,HUMULIN R) 100 units/mL injection Inject 32 Units into the skin 3 (three) times daily before meals.      . Lancets (FREESTYLE) lancets 1 each by Other route 3 (three) times daily. Use as instructed  100 each  11  . lisinopril (PRINIVIL,ZESTRIL) 40 MG tablet Take 40 mg by mouth daily.      . metFORMIN (GLUCOPHAGE) 1000 MG tablet Take 1 tablet (1,000 mg total) by mouth 2 (two) times daily with a meal.  60 tablet  5  . NOVOLIN R RELION 100 UNIT/ML injection INJECT 32 UNITS SUBCUTANEOUSLY THREE TIMES A DAY WITH MEALS  50 mL  3  . simvastatin (ZOCOR) 80 MG tablet Take 80 mg by mouth at bedtime.      Marland Kitchen  torsemide (DEMADEX) 20 MG tablet Take 20 mg by mouth daily.       No current facility-administered medications for this visit.    Allergies:   No Known Allergies  Social History:  The patient  reports that he quit smoking about 29 years ago. His smoking use included Cigarettes. He has a 45 pack-year smoking history. His smokeless tobacco use includes Chew. He reports that he does not drink alcohol or use illicit drugs.   Family History:  The patient's family history includes Alcohol abuse in his father; Aneurysm in his mother. There is no history of Colon cancer.   ROS:  Please see the history of present illness.      All other systems reviewed and negative.   PHYSICAL EXAM: VS:  BP 136/78  Pulse 101  Ht 5\' 8"  (1.727 m)  Wt 324 lb (146.965 kg)  BMI 49.28 kg/m2  SpO2 97% Well nourished, well developed, in no acute distress HEENT: normal Neck: no  JVD Cardiac:  normal S1, S2; RRR; no murmur Lungs:  clear to auscultation bilaterally, no wheezing, rhonchi or rales Abd: soft, nontender, no hepatomegaly Ext: trace edema Skin: warm and dry Neuro:  CNs 2-12 intact, no focal abnormalities noted  EKG:  NSR with RBBB  ASSESSMENT AND PLAN:  1. DOE which has become progressive.  This most likely is multifactorial from morbid obesity, obesity hypoventilation syndrome, COPD and diastolic dysfunction  but also need to rule out ASCAD.    - 2 day Lexiscan myoview to rule out ischemia  - check bnp 2. HTN - well controlled  - continue Lisinopril 3. Dyslipidemia  -I will stop his simvastatin since he is on 80mg  daily and I need to start him on Cardizem for diastolic CHF.  Will change to Lipitor 40mg  daily and recheck an ALT and fasting lipid in 6 week.  4. O2 dependent COPD 5. Morbid obesity with probable OSA but he says he cannot afford the study 6. Chronic LE edema - continue Torsemide 7. Probable OSA but patient refuses sleep study due to cost 8. Diastolic dysfunction by echo with chronic diastolic CHF - he is already on an ACE I  - I will add Cardizem CD 120mg  daily since his HR is elevated to reduce HR and increase diastolic filling time.  He is already on a diuretic. I would like to avoid beta blockers due to his COPD  Followup with me in 3 weeks  Signed, Armanda Magic, MD 09/14/2013 9:28 AM

## 2013-10-06 ENCOUNTER — Encounter: Payer: Self-pay | Admitting: Cardiology

## 2013-10-06 ENCOUNTER — Ambulatory Visit (HOSPITAL_COMMUNITY): Payer: Medicare Other | Attending: Cardiology | Admitting: Radiology

## 2013-10-06 VITALS — BP 140/66 | Ht 68.0 in | Wt 330.0 lb

## 2013-10-06 DIAGNOSIS — J449 Chronic obstructive pulmonary disease, unspecified: Secondary | ICD-10-CM | POA: Insufficient documentation

## 2013-10-06 DIAGNOSIS — I10 Essential (primary) hypertension: Secondary | ICD-10-CM | POA: Insufficient documentation

## 2013-10-06 DIAGNOSIS — E119 Type 2 diabetes mellitus without complications: Secondary | ICD-10-CM | POA: Insufficient documentation

## 2013-10-06 DIAGNOSIS — J4489 Other specified chronic obstructive pulmonary disease: Secondary | ICD-10-CM | POA: Insufficient documentation

## 2013-10-06 DIAGNOSIS — R0609 Other forms of dyspnea: Secondary | ICD-10-CM | POA: Insufficient documentation

## 2013-10-06 DIAGNOSIS — Z87891 Personal history of nicotine dependence: Secondary | ICD-10-CM | POA: Insufficient documentation

## 2013-10-06 DIAGNOSIS — R0989 Other specified symptoms and signs involving the circulatory and respiratory systems: Secondary | ICD-10-CM | POA: Insufficient documentation

## 2013-10-06 DIAGNOSIS — Z794 Long term (current) use of insulin: Secondary | ICD-10-CM | POA: Insufficient documentation

## 2013-10-06 DIAGNOSIS — R0602 Shortness of breath: Secondary | ICD-10-CM | POA: Insufficient documentation

## 2013-10-06 MED ORDER — REGADENOSON 0.4 MG/5ML IV SOLN
0.4000 mg | Freq: Once | INTRAVENOUS | Status: AC
  Administered 2013-10-06: 0.4 mg via INTRAVENOUS

## 2013-10-06 MED ORDER — TECHNETIUM TC 99M SESTAMIBI GENERIC - CARDIOLITE
30.0000 | Freq: Once | INTRAVENOUS | Status: AC | PRN
  Administered 2013-10-06: 30 via INTRAVENOUS

## 2013-10-06 NOTE — Progress Notes (Signed)
[name] SITE 3 NUCLEAR MED [address] [place], [place] [address] [phone]    Cardiology Nuclear Med Study  [name] is a 67 y.o. male     MRN : [id number]     DOB: 04-16-1946  Procedure Date: 10/06/2013  Nuclear Med Background Indication for Stress Test:  Evaluation for Ischemia History:  COPD and Cardiac CT, 08/10/13 ECHO: EF: 65-70% MPI: 3/4 yrs ago Cardiac Risk Factors: History of Smoking, Hypertension and IDDM Type 2  Symptoms:  DOE and SOB   Nuclear Pre-Procedure Caffeine/Decaff Intake:  None NPO After: 7:00pm   Lungs:  clear O2 Sat: 97% on room air. IV 0.9% NS with Angio Cath:  22g  IV Site: R Hand  IV Started by:  Cathlyn Parsons, RN  Chest Size (in):  54 Cup Size: n/a  Height: 5\' 8"  (1.727 m)  Weight:  330 lb (149.687 kg)  BMI:  Body mass index is 50.19 kg/(m^2). Tech Comments:  CBG 141 this am and no diabetic meds    Nuclear Med Study 1 or 2 day study: 2 day  Stress Test Type:  Eugenie Birks  Reading MD: Armanda Magic, MD  Order Authorizing Provider:  Gevena Cotton  Resting Radionuclide: Technetium 59m Sestamibi  Resting Radionuclide Dose: 33.0 mCi on 10-12-13  Stress Radionuclide:  Technetium 79m Sestamibi  Stress Radionuclide Dose: 33. on 10-06-13          Stress Protocol Rest HR: 82 Stress HR: 93  Rest BP: 140/66 Stress BP: 135/76  Exercise Time (min): n/a METS: n/a   Predicted Max HR: 152 bpm % Max HR: 61.18 bpm Rate Pressure Product: 78469   Dose of Adenosine (mg):  n/a Dose of Lexiscan: 0.4 mg  Dose of Atropine (mg): n/a Dose of Dobutamine: n/a mcg/kg/min (at max HR)  Stress Test Technologist: Milana Na, EMT-P  Nuclear Technologist:  Harlow Asa, CNMT     Rest Procedure:  Myocardial perfusion imaging was performed at rest 45 minutes following the intravenous administration of Technetium 76m Sestamibi. Rest ECG: RBBB  Stress Procedure:  The patient received IV Lexiscan 0.4 mg over 15-seconds.  Technetium  29m Sestamibi injected at 30-seconds. This patient didn't have any symptoms with Lexiscan injection.Quantitative spect images were obtained after a 45 minute delay. Stress ECG: No significant change from baseline ECG  QPS Raw Data Images:  Mild diaphragmatic attenuation.  Normal left ventricular size. Stress Images:  There is decreased uptake in the inferior and apical walls. Rest Images:  There is decreased uptake in the inferior and apical segments Subtraction (SDS):  There is a partially fixed inferior and apical defect that is most consistent scar with peri infarct ischemia.  This also involves the inferoseptum. Transient Ischemic Dilatation (Normal <1.22):  1.28 Lung/Heart Ratio (Normal <0.45):  0.35  Quantitative Gated Spect Images QGS EDV:  182 ml QGS ESV:  100 ml  Impression Exercise Capacity:  Lexiscan with no exercise. BP Response:  Normal blood pressure response. Clinical Symptoms:  No significant symptoms noted. ECG Impression:  RBBB with no changes with Lexiscan infusion Comparison with Prior Nuclear Study: No images to compare  Overall Impression:  Intermediate risk stress nuclear study There is a partially fixed defect in the inferoapical and inferoseptal segments with some reversibility c/w scar with some peri infarct ischemia.  LV Ejection Fraction: 45%.  LV Wall Motion:  apcial dyskinesis  Signed:  Armanda Magic, MD 10/13/2013

## 2013-10-12 ENCOUNTER — Encounter: Payer: Self-pay | Admitting: Cardiovascular Disease

## 2013-10-12 ENCOUNTER — Ambulatory Visit (HOSPITAL_COMMUNITY): Payer: Medicare Other | Attending: Cardiovascular Disease

## 2013-10-12 DIAGNOSIS — R0989 Other specified symptoms and signs involving the circulatory and respiratory systems: Secondary | ICD-10-CM

## 2013-10-12 MED ORDER — TECHNETIUM TC 99M SESTAMIBI GENERIC - CARDIOLITE
30.0000 | Freq: Once | INTRAVENOUS | Status: AC | PRN
  Administered 2013-10-12: 30 via INTRAVENOUS

## 2013-10-15 ENCOUNTER — Encounter (HOSPITAL_COMMUNITY): Payer: Self-pay | Admitting: Pharmacy Technician

## 2013-10-15 ENCOUNTER — Ambulatory Visit (INDEPENDENT_AMBULATORY_CARE_PROVIDER_SITE_OTHER): Payer: Medicare Other | Admitting: Cardiology

## 2013-10-15 ENCOUNTER — Encounter: Payer: Self-pay | Admitting: General Surgery

## 2013-10-15 DIAGNOSIS — R9439 Abnormal result of other cardiovascular function study: Secondary | ICD-10-CM

## 2013-10-15 DIAGNOSIS — E785 Hyperlipidemia, unspecified: Secondary | ICD-10-CM

## 2013-10-15 DIAGNOSIS — R0609 Other forms of dyspnea: Secondary | ICD-10-CM

## 2013-10-15 DIAGNOSIS — I10 Essential (primary) hypertension: Secondary | ICD-10-CM

## 2013-10-15 DIAGNOSIS — I509 Heart failure, unspecified: Secondary | ICD-10-CM

## 2013-10-15 DIAGNOSIS — I5032 Chronic diastolic (congestive) heart failure: Secondary | ICD-10-CM

## 2013-10-15 NOTE — Patient Instructions (Signed)
Your physician recommends that you continue on your current medications as directed. Please refer to the Current Medication list given to you today.  Your physician has requested that you have a cardiac catheterization. Cardiac catheterization is used to diagnose and/or treat various heart conditions. Doctors may recommend this procedure for a number of different reasons. The most common reason is to evaluate chest pain. Chest pain can be a symptom of coronary artery disease (CAD), and cardiac catheterization can show whether plaque is narrowing or blocking your heart's arteries. This procedure is also used to evaluate the valves, as well as measure the blood flow and oxygen levels in different parts of your heart. For further information please visit [email or web address] Please follow instruction sheet, as given.  Your physician recommends that you return for lab work on 10/20/13 for Lab work needed prior to having your Cath done on the 29th

## 2013-10-15 NOTE — Progress Notes (Signed)
[address] [place], [place]  [address] Phone: [phone] Fax:  [phone]  Date:  10/15/2013   ID:  MAHDI FRYE, DOB 11-07-1945, MRN [id number]  PCP:  Judie Petit, MD  Cardiologist:  Armanda Magic, MD     History of Present Illness:[name] is a 67 y.o. male with a history of O2 dependent COPD, HTN, dyslipidemia, morbid obesity who presents today for evaluation. He has been having increased SOB over the past few months. He denies any chest pain. He is able to walk around the house but cannot go shopping without using an electric cart. He has to sleep 50% of the night in a chair. He wakes up around 2-4am with SOB and has to go sleep in the chair. He has chronic LE edema which is controlled with TED hose stockings and diuretic. He denies any palpitations, dizziness or syncope. He thinks he has OSA but says he does not have the money to pay for it. 2D echo showed normal LVF with moderate LVF and diastolic dysfunction. He recently underwent nuclear stress test which showed a large partially fixed defect in the inferoapical and inferoseptal regions with peri infarct ischemia and apical dyskinesis.       Wt Readings from Last 3 Encounters:  10/06/13 330 lb (149.687 kg)  09/14/13 324 lb (146.965 kg)  07/23/13 331 lb (150.141 kg)     Past Medical History  Diagnosis Date  . DIABETES MELLITUS, TYPE II 06/24/2007  . HYPERLIPIDEMIA 06/24/2007  . HYPERTENSION 05/01/2007  . Hypoxemia 01/27/2010  . OBESITY 09/02/2008  . OSTEOARTHRITIS 05/01/2007  . UNSPEC COMBINED SYSTOLIC&DIASTOLIC HEART FAILURE 02/25/2009    pt denies  . COPD (chronic obstructive pulmonary disease)   . Asthma     as a child  . Cataract   . OSA (obstructive sleep apnea)     not tested  . History of kidney stones     Current Outpatient Prescriptions  Medication Sig Dispense Refill  . aspirin 81 MG tablet Take 81 mg by mouth daily.        Marland Kitchen atorvastatin (LIPITOR) 40 MG tablet Take 1 tablet  (40 mg total) by mouth daily.  30 tablet  3  . beta carotene w/minerals (OCUVITE) tablet Take 1 tablet by mouth daily.      . cetirizine (ZYRTEC) 10 MG tablet Take 10 mg by mouth daily.      . clotrimazole-betamethasone (LOTRISONE) cream Apply 1 application topically 2 (two) times daily as needed (for itching).      Marland Kitchen diltiazem (CARDIZEM CD) 120 MG 24 hr capsule Take 1 capsule (120 mg total) by mouth daily.  30 capsule  3  . glucose blood (FREESTYLE LITE) test strip 1 each by Other route 3 (three) times daily. Use as instructed  100 each  11  . HYDROcodone-acetaminophen (VICODIN) 5-500 MG per tablet Take 1 tablet by mouth daily as needed for pain.      Marland Kitchen insulin NPH (HUMULIN N,NOVOLIN N) 100 UNIT/ML injection Inject 70 Units into the skin 2 (two) times daily. "Novolin N Relion"      . insulin regular (NOVOLIN R,HUMULIN R) 100 units/mL injection Inject 32 Units into the skin 3 (three) times daily before meals.      . Lancets (FREESTYLE) lancets 1 each by Other route 3 (three) times daily. Use as instructed  100 each  11  . lisinopril (PRINIVIL,ZESTRIL) 40 MG tablet Take 40 mg by mouth daily.      Marland Kitchen  metFORMIN (GLUCOPHAGE) 1000 MG tablet Take 1 tablet (1,000 mg total) by mouth 2 (two) times daily with a meal.  60 tablet  5  . torsemide (DEMADEX) 20 MG tablet Take 20 mg by mouth daily.       No current facility-administered medications for this visit.    Allergies:   No Known Allergies  Social History:  The patient  reports that he quit smoking about 29 years ago. His smoking use included Cigarettes. He has a 45 pack-year smoking history. His smokeless tobacco use includes Chew. He reports that he does not drink alcohol or use illicit drugs.   Family History:  The patient's family history includes Alcohol abuse in his father; Aneurysm in his mother. There is no history of Colon cancer.   ROS:  Please see the history of present illness.      All other systems reviewed and negative.   PHYSICAL  EXAM: VS:  There were no vitals taken for this visit. Well nourished, well developed, in no acute distress HEENT: normal Neck: no JVD Lung:  CTA Cor:  RRR with no M/R/G Ext:  No C/E/E  ASSESSMENT AND PLAN:   1.   DOE which has become progressive. This most likely is multifactorial from morbid obesity, obesity hypoventilation syndrome, COPD and diastolic dysfunction but he has a very abnormal nuclear stress test worrisome for ASCAD  - I have reviewed the findings of the nuclear stress test with him and have recommended that we proceed with cardiac cath.  Cardiac catheterization was discussed with the patient fully including risks on myocardial infarction, death, stroke, bleeding, arrhythmia, dye allergy, renal insufficiency or bleeding.  All patient questions and concerns were discussed and the patient understands and is willing to proceed.  I will set him for radial artery cath with Dr. Eldridge Dace in 1 week given his morbid obesity. 2.  HTN - well controlled  - continue Lisinopril/Diltiazem 3.  Dyslipidemia  - Continue Lipitor 40mg  daily 4.  O2 dependent COPD 5.  Morbid obesity with probable OSA but he says he cannot afford the study 6.  Probable OSA but patient refuses sleep study due to cost 7.  Diastolic dysfunction by echo with chronic diastolic CHF   - continue diltiazem/ACE I/Torsemide       Signed, Armanda Magic, MD 10/15/2013 8:57 AM

## 2013-10-16 ENCOUNTER — Encounter: Payer: Self-pay | Admitting: Cardiology

## 2013-10-16 DIAGNOSIS — R9439 Abnormal result of other cardiovascular function study: Secondary | ICD-10-CM | POA: Insufficient documentation

## 2013-10-20 ENCOUNTER — Other Ambulatory Visit (INDEPENDENT_AMBULATORY_CARE_PROVIDER_SITE_OTHER): Payer: Medicare Other

## 2013-10-20 DIAGNOSIS — I509 Heart failure, unspecified: Secondary | ICD-10-CM

## 2013-10-20 DIAGNOSIS — R9439 Abnormal result of other cardiovascular function study: Secondary | ICD-10-CM

## 2013-10-20 LAB — CBC WITH DIFFERENTIAL/PLATELET
Basophils Absolute: 0 10*3/uL (ref 0.0–0.1)
Basophils Relative: 0.4 % (ref 0.0–3.0)
HCT: 40.8 % (ref 39.0–52.0)
Lymphocytes Relative: 21.7 % (ref 12.0–46.0)
Lymphs Abs: 2.1 10*3/uL (ref 0.7–4.0)
Monocytes Absolute: 1 10*3/uL (ref 0.1–1.0)
Monocytes Relative: 10.9 % (ref 3.0–12.0)
Neutrophils Relative %: 61.5 % (ref 43.0–77.0)
Platelets: 291 10*3/uL (ref 150.0–400.0)
RDW: 15.1 % — ABNORMAL HIGH (ref 11.5–14.6)

## 2013-10-20 LAB — BASIC METABOLIC PANEL
BUN: 26 mg/dL — ABNORMAL HIGH (ref 6–23)
CO2: 26 mEq/L (ref 19–32)
Calcium: 9.1 mg/dL (ref 8.4–10.5)
Chloride: 99 mEq/L (ref 96–112)
GFR: 71.43 mL/min (ref >60.00)
Glucose, Bld: 117 mg/dL — ABNORMAL HIGH (ref 70–99)
Potassium: 4.4 mEq/L (ref 3.5–5.1)
Sodium: 135 mEq/L (ref 135–145)

## 2013-10-20 LAB — PROTIME-INR: Prothrombin Time: 10.8 s (ref 10.2–12.4)

## 2013-10-24 ENCOUNTER — Other Ambulatory Visit: Payer: Self-pay | Admitting: Interventional Cardiology

## 2013-10-24 DIAGNOSIS — R9430 Abnormal result of cardiovascular function study, unspecified: Secondary | ICD-10-CM

## 2013-10-26 ENCOUNTER — Encounter (HOSPITAL_COMMUNITY)
Admission: RE | Disposition: A | Discharge status: Home / Self Care | Payer: Self-pay | Source: Ambulatory Visit | Attending: Interventional Cardiology

## 2013-10-26 ENCOUNTER — Ambulatory Visit (HOSPITAL_COMMUNITY)
Admission: RE | Admit: 2013-10-26 | Discharge: 2013-10-26 | Disposition: A | Discharge status: Home / Self Care | Payer: Medicare Other | Source: Ambulatory Visit | Attending: Interventional Cardiology | Admitting: Interventional Cardiology

## 2013-10-26 ENCOUNTER — Other Ambulatory Visit: Payer: Medicare Other

## 2013-10-26 ENCOUNTER — Telehealth: Payer: Self-pay | Admitting: Cardiology

## 2013-10-26 DIAGNOSIS — G4733 Obstructive sleep apnea (adult) (pediatric): Secondary | ICD-10-CM | POA: Insufficient documentation

## 2013-10-26 DIAGNOSIS — E119 Type 2 diabetes mellitus without complications: Secondary | ICD-10-CM | POA: Insufficient documentation

## 2013-10-26 DIAGNOSIS — Z87891 Personal history of nicotine dependence: Secondary | ICD-10-CM | POA: Insufficient documentation

## 2013-10-26 DIAGNOSIS — I209 Angina pectoris, unspecified: Secondary | ICD-10-CM | POA: Insufficient documentation

## 2013-10-26 DIAGNOSIS — I519 Heart disease, unspecified: Secondary | ICD-10-CM | POA: Insufficient documentation

## 2013-10-26 DIAGNOSIS — R9439 Abnormal result of other cardiovascular function study: Secondary | ICD-10-CM

## 2013-10-26 DIAGNOSIS — E785 Hyperlipidemia, unspecified: Secondary | ICD-10-CM | POA: Insufficient documentation

## 2013-10-26 DIAGNOSIS — J4489 Other specified chronic obstructive pulmonary disease: Secondary | ICD-10-CM | POA: Insufficient documentation

## 2013-10-26 DIAGNOSIS — Z7982 Long term (current) use of aspirin: Secondary | ICD-10-CM | POA: Insufficient documentation

## 2013-10-26 DIAGNOSIS — I10 Essential (primary) hypertension: Secondary | ICD-10-CM | POA: Insufficient documentation

## 2013-10-26 DIAGNOSIS — I5030 Unspecified diastolic (congestive) heart failure: Secondary | ICD-10-CM | POA: Insufficient documentation

## 2013-10-26 DIAGNOSIS — I2510 Atherosclerotic heart disease of native coronary artery without angina pectoris: Secondary | ICD-10-CM | POA: Insufficient documentation

## 2013-10-26 DIAGNOSIS — Z9981 Dependence on supplemental oxygen: Secondary | ICD-10-CM | POA: Insufficient documentation

## 2013-10-26 DIAGNOSIS — Z794 Long term (current) use of insulin: Secondary | ICD-10-CM | POA: Insufficient documentation

## 2013-10-26 DIAGNOSIS — J449 Chronic obstructive pulmonary disease, unspecified: Secondary | ICD-10-CM | POA: Insufficient documentation

## 2013-10-26 HISTORY — PX: LEFT HEART CATHETERIZATION WITH CORONARY ANGIOGRAM: SHX5451

## 2013-10-26 LAB — GLUCOSE, CAPILLARY: Glucose-Capillary: 172 mg/dL — ABNORMAL HIGH (ref 70–99)

## 2013-10-26 SURGERY — LEFT HEART CATHETERIZATION WITH CORONARY ANGIOGRAM
Anesthesia: LOCAL

## 2013-10-26 MED ORDER — ACETAMINOPHEN 325 MG PO TABS: 650.0000 mg | ORAL_TABLET | ORAL | Status: DC | PRN

## 2013-10-26 MED ORDER — ONDANSETRON HCL 4 MG/2ML IJ SOLN: 4.0000 mg | Freq: Four times a day (QID) | INTRAMUSCULAR | Status: DC | PRN

## 2013-10-26 MED ORDER — LIDOCAINE HCL (PF) 1 % IJ SOLN
INTRAMUSCULAR | Status: AC
  Filled 2013-10-26: qty 30

## 2013-10-26 MED ORDER — CLOPIDOGREL BISULFATE 75 MG PO TABS: 75.0000 mg | ORAL_TABLET | Freq: Every day | ORAL | Status: DC

## 2013-10-26 MED ORDER — NITROGLYCERIN 0.2 MG/ML ON CALL CATH LAB
INTRAVENOUS | Status: AC
  Filled 2013-10-26: qty 1

## 2013-10-26 MED ORDER — MIDAZOLAM HCL 2 MG/2ML IJ SOLN
INTRAMUSCULAR | Status: AC
  Filled 2013-10-26: qty 2

## 2013-10-26 MED ORDER — SODIUM CHLORIDE 0.9 % IJ SOLN: 3.0000 mL | Freq: Two times a day (BID) | INTRAMUSCULAR | Status: DC

## 2013-10-26 MED ORDER — SODIUM CHLORIDE 0.9 % IV SOLN: 1.0000 mL/kg/h | INTRAVENOUS | Status: AC

## 2013-10-26 MED ORDER — SODIUM CHLORIDE 0.9 % IV SOLN: 250.0000 mL | INTRAVENOUS | Status: DC | PRN

## 2013-10-26 MED ORDER — FENTANYL CITRATE 0.05 MG/ML IJ SOLN
INTRAMUSCULAR | Status: AC
  Filled 2013-10-26: qty 2

## 2013-10-26 MED ORDER — SODIUM CHLORIDE 0.9 % IJ SOLN: 3.0000 mL | INTRAMUSCULAR | Status: DC | PRN

## 2013-10-26 MED ORDER — METFORMIN HCL 1000 MG PO TABS: 1000.0000 mg | ORAL_TABLET | Freq: Two times a day (BID) | ORAL | Status: DC

## 2013-10-26 MED ORDER — ISOSORBIDE MONONITRATE ER 30 MG PO TB24: 30.0000 mg | ORAL_TABLET | Freq: Every day | ORAL | Status: DC

## 2013-10-26 MED ORDER — VERAPAMIL HCL 2.5 MG/ML IV SOLN
INTRAVENOUS | Status: AC
  Filled 2013-10-26: qty 2

## 2013-10-26 MED ORDER — HEPARIN (PORCINE) IN NACL 2-0.9 UNIT/ML-% IJ SOLN
INTRAMUSCULAR | Status: AC
  Filled 2013-10-26: qty 1500

## 2013-10-26 MED ORDER — ASPIRIN 81 MG PO CHEW: 81.0000 mg | CHEWABLE_TABLET | ORAL | Status: DC

## 2013-10-26 MED ORDER — SODIUM CHLORIDE 0.9 % IV SOLN
INTRAVENOUS | Status: DC
  Administered 2013-10-26: 1000 mL via INTRAVENOUS

## 2013-10-26 NOTE — Interval H&P Note (Signed)
Cath Lab Visit (complete for each Cath Lab visit)  Clinical Evaluation Leading to the Procedure:   ACS: no  Non-ACS:    Anginal Classification: CCS III  Anti-ischemic medical therapy: Minimal Therapy (1 class of medications)  Non-Invasive Test Results: Intermediate-risk stress test findings: cardiac mortality 1-3%/year  Prior CABG: No previous CABG      History and Physical Interval Note:  10/26/2013 9:03 AM  [name]  has presented today for surgery, with the diagnosis of shortness of breath  The various methods of treatment have been discussed with the patient and family. After consideration of risks, benefits and other options for treatment, the patient has consented to  Procedure(s): LEFT HEART CATHETERIZATION WITH CORONARY ANGIOGRAM (N/A) as a surgical intervention .  The patient's history has been reviewed, patient examined, no change in status, stable for surgery.  I have reviewed the patient's chart and labs.  Questions were answered to the patient's satisfaction.     Alison Kubicki S.

## 2013-10-26 NOTE — CV Procedure (Signed)
       PROCEDURE:  Left heart catheterization with selective coronary angiography, left ventriculogram.  INDICATIONS:  Abnormal stress test; Class III angina  The risks, benefits, and details of the procedure were explained to the patient.  The patient verbalized understanding and wanted to proceed.  Informed written consent was obtained.  PROCEDURE TECHNIQUE:  After Xylocaine anesthesia a 18F slender sheath was placed in the right radial artery with a single anterior needle wall stick.   Right coronary angiography was done using a Judkins R4 guide catheter.  Left coronary angiography was done using a Judkins L3.5 guide catheter.  Left ventriculography was done using a pigtail catheter.  A TR band was used for hemostasis.   CONTRAST:  Total of 125 cc.  COMPLICATIONS:  None.    HEMODYNAMICS:  Aortic pressure was 111/60; LV pressure was 115/10; LVEDP 20.  There was no gradient between the left ventricle and aorta.    ANGIOGRAPHIC DATA:   The left main coronary artery is patent.  The left anterior descending artery is a large vessel.  There are several areas of complete and subtotal occlusion in the mid vessel.  Competitive flow noted in the distal LAD.  Medium sized diagonal with a 70% stenosis.  The left circumflex artery is a large vessel with mild irregularities.  There is a large OM1 which is widely patent.    The right coronary artery is a large dominant vessel with a 60-70% mid vessel lesion in some views. There is a medium sized PDA and PLA which have mild disease.  LEFT VENTRICULOGRAM:  Left ventricular angiogram was done in the 30 RAO projection and revealed anterior hypokinesis with low normal systolic function with an estimated ejection fraction of 50%.  LVEDP 20 was mmHg.  IMPRESSIONS:  1. Normal left main coronary artery. 2. 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 3. Mild disease in the  left circumflex artery and its branches. 4. Moderate  disease in the mid right coronary artery. 5. Low normal left ventricular systolic function.  LVEDP 20 mmHg.  Ejection fraction 50%.  RECOMMENDATION:  Increase medical therapy.  Start Imdur and Plavix.  If sx continue, would consider PCI of CTO of the LAD.

## 2013-10-26 NOTE — Progress Notes (Signed)
Pts BP back to 126/50.  Pt denies any symptoms.  Dr. Eldridge Dace notified and ok with pts discharge.

## 2013-10-26 NOTE — H&P (View-Only) (Signed)
[address] [place],   [address] Phone: [phone] Fax:  [phone]  Date:  10/15/2013   ID:  [name], DOB 09/03/1946, MRN [id number]  PCP:  SWORDS,BRUCE HENRY, MD  Cardiologist:  Pattye Meda, MD     History of Present Illness:[name] is a 67 y.o. male with a history of O2 dependent COPD, HTN, dyslipidemia, morbid obesity who presents today for evaluation. He has been having increased SOB over the past few months. He denies any chest pain. He is able to walk around the house but cannot go shopping without using an electric cart. He has to sleep 50% of the night in a chair. He wakes up around 2-4am with SOB and has to go sleep in the chair. He has chronic LE edema which is controlled with TED hose stockings and diuretic. He denies any palpitations, dizziness or syncope. He thinks he has OSA but says he does not have the money to pay for it. 2D echo showed normal LVF with moderate LVF and diastolic dysfunction. He recently underwent nuclear stress test which showed a large partially fixed defect in the inferoapical and inferoseptal regions with peri infarct ischemia and apical dyskinesis.       Wt Readings from Last 3 Encounters:  10/06/13 330 lb (149.687 kg)  09/14/13 324 lb (146.965 kg)  07/23/13 331 lb (150.141 kg)     Past Medical History  Diagnosis Date  . DIABETES MELLITUS, TYPE II 06/24/2007  . HYPERLIPIDEMIA 06/24/2007  . HYPERTENSION 05/01/2007  . Hypoxemia 01/27/2010  . OBESITY 09/02/2008  . OSTEOARTHRITIS 05/01/2007  . UNSPEC COMBINED SYSTOLIC&DIASTOLIC HEART FAILURE 02/25/2009    pt denies  . COPD (chronic obstructive pulmonary disease)   . Asthma     as a child  . Cataract   . OSA (obstructive sleep apnea)     not tested  . History of kidney stones     Current Outpatient Prescriptions  Medication Sig Dispense Refill  . aspirin 81 MG tablet Take 81 mg by mouth daily.        . atorvastatin (LIPITOR) 40 MG tablet Take 1 tablet  (40 mg total) by mouth daily.  30 tablet  3  . beta carotene w/minerals (OCUVITE) tablet Take 1 tablet by mouth daily.      . cetirizine (ZYRTEC) 10 MG tablet Take 10 mg by mouth daily.      . clotrimazole-betamethasone (LOTRISONE) cream Apply 1 application topically 2 (two) times daily as needed (for itching).      . diltiazem (CARDIZEM CD) 120 MG 24 hr capsule Take 1 capsule (120 mg total) by mouth daily.  30 capsule  3  . glucose blood (FREESTYLE LITE) test strip 1 each by Other route 3 (three) times daily. Use as instructed  100 each  11  . HYDROcodone-acetaminophen (VICODIN) 5-500 MG per tablet Take 1 tablet by mouth daily as needed for pain.      . insulin NPH (HUMULIN N,NOVOLIN N) 100 UNIT/ML injection Inject 70 Units into the skin 2 (two) times daily. "Novolin N Relion"      . insulin regular (NOVOLIN R,HUMULIN R) 100 units/mL injection Inject 32 Units into the skin 3 (three) times daily before meals.      . Lancets (FREESTYLE) lancets 1 each by Other route 3 (three) times daily. Use as instructed  100 each  11  . lisinopril (PRINIVIL,ZESTRIL) 40 MG tablet Take 40 mg by mouth daily.      .   metFORMIN (GLUCOPHAGE) 1000 MG tablet Take 1 tablet (1,000 mg total) by mouth 2 (two) times daily with a meal.  60 tablet  5  . torsemide (DEMADEX) 20 MG tablet Take 20 mg by mouth daily.       No current facility-administered medications for this visit.    Allergies:   No Known Allergies  Social History:  The patient  reports that he quit smoking about 29 years ago. His smoking use included Cigarettes. He has a 45 pack-year smoking history. His smokeless tobacco use includes Chew. He reports that he does not drink alcohol or use illicit drugs.   Family History:  The patient's family history includes Alcohol abuse in his father; Aneurysm in his mother. There is no history of Colon cancer.   ROS:  Please see the history of present illness.      All other systems reviewed and negative.   PHYSICAL  EXAM: VS:  There were no vitals taken for this visit. Well nourished, well developed, in no acute distress HEENT: normal Neck: no JVD Lung:  CTA Cor:  RRR with no M/R/G Ext:  No C/E/E  ASSESSMENT AND PLAN:   1.   DOE which has become progressive. This most likely is multifactorial from morbid obesity, obesity hypoventilation syndrome, COPD and diastolic dysfunction but he has a very abnormal nuclear stress test worrisome for ASCAD  - I have reviewed the findings of the nuclear stress test with him and have recommended that we proceed with cardiac cath.  Cardiac catheterization was discussed with the patient fully including risks on myocardial infarction, death, stroke, bleeding, arrhythmia, dye allergy, renal insufficiency or bleeding.  All patient questions and concerns were discussed and the patient understands and is willing to proceed.  I will set him for radial artery cath with Dr. Varanasi in 1 week given his morbid obesity. 2.  HTN - well controlled  - continue Lisinopril/Diltiazem 3.  Dyslipidemia  - Continue Lipitor 40mg daily 4.  O2 dependent COPD 5.  Morbid obesity with probable OSA but he says he cannot afford the study 6.  Probable OSA but patient refuses sleep study due to cost 7.  Diastolic dysfunction by echo with chronic diastolic CHF   - continue diltiazem/ACE I/Torsemide       Signed, Dale Ribeiro, MD 10/15/2013 8:57 AM  

## 2013-10-26 NOTE — Telephone Encounter (Signed)
New Message  Pt was prescribed Plavix today... Daughter requests a call back to determine if the Pt should continue to take Asprin along with the Plavix. Please call back to assist.

## 2013-10-26 NOTE — Progress Notes (Signed)
Pts blood pressure is low at 90/45 and 83/50.  Taken in several different limbs. Cuff repositioned with same results.  Pt denies feeling bad at all and denies dizziness, H/A nausea or any other symtoms.  Dr. Eldridge Dace notified and delayed discharge for another hour if BP back to baseline.

## 2013-10-26 NOTE — Telephone Encounter (Signed)
To Dr. Eldridge Dace. On your discharge you did not state that pt should stop taking 81 MG aspirin. I made pt aware. Wanted to send to you as well.

## 2013-10-27 NOTE — Telephone Encounter (Signed)
Ok to continue asa 81 mg daily with the plavix.

## 2013-10-28 NOTE — Telephone Encounter (Signed)
Noted and pt is aware.

## 2013-10-29 LAB — HM DIABETES EYE EXAM

## 2013-10-30 ENCOUNTER — Other Ambulatory Visit: Payer: Self-pay | Admitting: *Deleted

## 2013-10-30 ENCOUNTER — Telehealth: Payer: Self-pay | Admitting: Internal Medicine

## 2013-10-30 MED ORDER — OSELTAMIVIR PHOSPHATE 75 MG PO CAPS: 75.0000 mg | ORAL_CAPSULE | Freq: Every day | ORAL | Status: DC

## 2013-10-30 NOTE — Telephone Encounter (Signed)
May have tamiflu 75 qd for 10 days per dr Shawna Orleans- pt warned.can cause confusion

## 2013-10-30 NOTE — Telephone Encounter (Signed)
Patient Information:  Caller Name: Parth  Phone: [phone]  Patient: [name],   Gender: Male  DOB: [birth year]  Age: 68 Years  PCP: Phoebe Sharps (Adults only)  Office Follow Up:  Does the office need to follow up with this patient?: Yes  Instructions For The Office: tamiflu Rx please krs/can  RN Note:  Patient calling about tamiflu order.  States has positive health history for high risk for flu complications.  States got flu shot this winter.  Cared for grandbaby and daughter 10/28/13 and they have been diagnosed with positive flu.  States no current symptoms; wants tamiflu called in.   No standing orders for tamiflu; info to office via Epic/high priority for provider review/Rx/callback.  May reach patient at [phone].  krs/can  Symptoms  Reason For Call & Symptoms: influenza exposure  Reviewed Health History In EMR: Yes  Reviewed Medications In EMR: Yes  Reviewed Allergies In EMR: Yes  Reviewed Surgeries / Procedures: Yes  Date of Onset of Symptoms: Unknown  Guideline(s) Used:  Influenza Exposure  Disposition Per Guideline:   Discuss with PCP and Callback by Nurse within 1 Hour  Reason For Disposition Reached:   Influenza EXPOSURE within last 72 hours (3 days) and exposed person is HIGH RISK (e.g., age > 11 years, pregnant, HIV+, chronic medical condition)  Advice Given:  N/A  Patient Will Follow Care Advice:  YES

## 2013-11-03 ENCOUNTER — Encounter: Payer: Medicare Other | Admitting: Cardiology

## 2013-11-04 ENCOUNTER — Telehealth: Payer: Self-pay | Admitting: Interventional Cardiology

## 2013-11-04 NOTE — Telephone Encounter (Signed)
I need to see him on Thursday re: possible PCI

## 2013-11-04 NOTE — Telephone Encounter (Signed)
Pt notified. Appt made.

## 2013-11-05 ENCOUNTER — Encounter: Payer: Medicare Other | Admitting: Cardiology

## 2013-11-05 ENCOUNTER — Encounter: Payer: Self-pay | Admitting: Interventional Cardiology

## 2013-11-05 ENCOUNTER — Ambulatory Visit (INDEPENDENT_AMBULATORY_CARE_PROVIDER_SITE_OTHER): Payer: Medicare Other | Admitting: Interventional Cardiology

## 2013-11-05 VITALS — BP 130/59 | HR 96 | Ht 68.0 in | Wt 327.0 lb

## 2013-11-05 DIAGNOSIS — I2510 Atherosclerotic heart disease of native coronary artery without angina pectoris: Secondary | ICD-10-CM

## 2013-11-05 NOTE — Patient Instructions (Addendum)
Your physician wants you to follow-up in: 3 months with Dr. Irish Lack. You will receive a reminder letter in the mail two months in advance. If you don't receive a letter, please call our office to schedule the follow-up appointment.   Your physician recommends that you continue on your current medications as directed. Please refer to the Current Medication list given to you today.

## 2013-11-10 ENCOUNTER — Other Ambulatory Visit: Payer: Self-pay | Admitting: Internal Medicine

## 2013-11-10 DIAGNOSIS — I2510 Atherosclerotic heart disease of native coronary artery without angina pectoris: Secondary | ICD-10-CM | POA: Insufficient documentation

## 2013-11-10 NOTE — Progress Notes (Signed)
Patient ID: [name], male   DOB: 1945-11-16, 67 y.o.   MRN: [id number]    [name], [name], [name]  [address] Phone: [phone] Fax:  [phone]  Date:  11/10/2013   ID:  [name], [name] 02-22-1946, MRN [id number]  PCP:  [name] Hurter, MD      History of Present Illness: [name] is a 68 y.o. male with coronary artery disease. He underwent cardiac catheterization a week ago. He was found to have a chronically occluded LAD. There are right-to-left collaterals. There were several areas of occlusion. Is also affected by severe COPD. HEENT relies on home oxygen. After the catheterization, his medical therapy was intensified. Since that time, he does not report any chest discomfort. His shortness of breath is at baseline. He denies any nausea or diaphoresis. His walking is limited mostly by his breathing, which he attributes to his COPD. He is still able to complete his ADLs.   Wt Readings from Last 3 Encounters:  11/05/13 327 lb (148.326 kg)  10/26/13 315 lb (142.883 kg)  10/26/13 315 lb (142.883 kg)     Past Medical History  Diagnosis Date  . DIABETES MELLITUS, TYPE II 06/24/2007  . HYPERLIPIDEMIA 06/24/2007  . HYPERTENSION 05/01/2007  . Hypoxemia 01/27/2010  . OBESITY 09/02/2008  . OSTEOARTHRITIS 05/01/2007  . UNSPEC COMBINED SYSTOLIC&DIASTOLIC HEART FAILURE 3/53/6144    pt denies  . COPD (chronic obstructive pulmonary disease)   . Asthma     as a child  . Cataract   . OSA (obstructive sleep apnea)     not tested  . History of kidney stones     Current Outpatient Prescriptions  Medication Sig Dispense Refill  . aspirin 81 MG tablet Take 81 mg by mouth daily.        Marland Kitchen atorvastatin (LIPITOR) 40 MG tablet Take 1 tablet (40 mg total) by mouth daily.  30 tablet  3  . beta carotene w/minerals (OCUVITE) tablet Take 1 tablet by mouth daily.      . cetirizine (ZYRTEC) 10 MG tablet Take 10 mg by mouth daily.      . clopidogrel (PLAVIX) 75 MG  tablet Take 1 tablet (75 mg total) by mouth daily with breakfast.  30 tablet  11  . clotrimazole-betamethasone (LOTRISONE) cream Apply 1 application topically 2 (two) times daily as needed (for itching).      Marland Kitchen diltiazem (CARDIZEM CD) 120 MG 24 hr capsule Take 1 capsule (120 mg total) by mouth daily.  30 capsule  3  . glucose blood (FREESTYLE LITE) test strip 1 each by Other route 3 (three) times daily. Use as instructed  100 each  11  . HYDROcodone-acetaminophen (VICODIN) 5-500 MG per tablet Take 1 tablet by mouth daily as needed for pain.      Marland Kitchen insulin regular (NOVOLIN R,HUMULIN R) 100 units/mL injection Inject 32 Units into the skin 3 (three) times daily before meals.      . isosorbide mononitrate (IMDUR) 30 MG 24 hr tablet Take 1 tablet (30 mg total) by mouth daily.  30 tablet  11  . Lancets (FREESTYLE) lancets 1 each by Other route 3 (three) times daily. Use as instructed  100 each  11  . lisinopril (PRINIVIL,ZESTRIL) 40 MG tablet Take 40 mg by mouth daily.      . metFORMIN (GLUCOPHAGE) 1000 MG tablet Take 1 tablet (1,000 mg total) by mouth 2 (two) times daily with a meal.  60 tablet  5  . oseltamivir (TAMIFLU) 75 MG capsule Take 1 capsule (75 mg total) by mouth daily.  10 capsule  0  . tobramycin-dexamethasone (TOBRADEX) ophthalmic solution Place 1 drop into both eyes daily.      Marland Kitchen torsemide (DEMADEX) 20 MG tablet Take 20 mg by mouth daily.      Marland Kitchen NOVOLIN N RELION 100 UNIT/ML injection INJECT 70 UNITS SUBCUTANEOUSLY TWICE DAILY  10 mL  5   No current facility-administered medications for this visit.    Allergies:   No Known Allergies  Social History:  The patient  reports that he quit smoking about 30 years ago. His smoking use included Cigarettes. He has a 45 pack-year smoking history. His smokeless tobacco use includes Chew. He reports that he does not drink alcohol or use illicit drugs.   Family History:  The patient's family history includes Alcohol abuse in his father; Aneurysm in  his mother. There is no history of Colon cancer.   ROS:  Please see the history of present illness.  No nausea, vomiting.  No fevers, chills.  No focal weakness.  No dysuria.    All other systems reviewed and negative.   PHYSICAL EXAM: VS:  BP 130/59  Pulse 96  Ht 5\' 8"  (1.727 m)  Wt 327 lb (148.326 kg)  BMI 49.73 kg/m2 Well nourished, well developed, in no acute , wearing oxygen HEENT: normal Neck: no JVD, no carotid bruits Cardiac:  normal S1, S2; RRR;  Lungs:  clear to auscultation bilaterally, no wheezing, rhonchi or rales Abd: soft, nontender, no hepatomegaly Ext: 2+ right radial pulse Skin: warm and dry Neuro:   no focal abnormalities noted      ASSESSMENT AND PLAN:  1. Coronary artery disease: Chronic total occlusion of the LAD. He is not having significant symptoms that he attributes to ischemia. For shortness of breath is attributed mostly to COPD. Overall, he feels like he is doing okay. We discussed the risks and benefits of revascularization of chronic total occlusion. He is not interested in taking the risks because he does not feel that bad. If his symptoms change, he'll let us know. He will followup in 3 months. Continue aggressive secondary prevention and medical therapy.  Signed, Mina Marble, MD, [facility] 11/10/2013 10:30 PM

## 2013-11-16 ENCOUNTER — Telehealth: Payer: Self-pay | Admitting: Internal Medicine

## 2013-11-16 NOTE — Telephone Encounter (Signed)
Called [facility] and told left message on voicemail

## 2013-11-16 NOTE — Telephone Encounter (Addendum)
Pt received only one vial of novolin. Pt stated he gets 5 vial at a time for the same price. Please call [facility] for additional 4 vials.  Pt has appt sch for april

## 2013-11-18 ENCOUNTER — Telehealth: Payer: Self-pay | Admitting: Internal Medicine

## 2013-11-18 NOTE — Telephone Encounter (Signed)
Pt called on Monday 11/16/13 regarding Insulin refill.   He states his Insulin refill only included 1 bottle when he usually gets 5 for the same price.  No one has called pt back to let him know what the problem is.  Reviewed EPIC and there was a message from 11/16/13 that [place] had called [facility], but no information if the problem was resolved.  Please follow up with pt.   He is leaving for [facility] soon, cell phone # [phone].

## 2013-11-19 NOTE — Telephone Encounter (Signed)
Called and spoke with the pharmacist and she changed the rx to 5 bottles with 5 refills, pt aware

## 2013-12-26 ENCOUNTER — Other Ambulatory Visit: Payer: Self-pay | Admitting: Cardiology

## 2014-01-20 ENCOUNTER — Ambulatory Visit: Payer: Medicare Other | Admitting: Internal Medicine

## 2014-01-23 ENCOUNTER — Other Ambulatory Visit: Payer: Self-pay | Admitting: Cardiology

## 2014-02-04 ENCOUNTER — Encounter: Payer: Self-pay | Admitting: Cardiology

## 2014-02-04 ENCOUNTER — Ambulatory Visit (INDEPENDENT_AMBULATORY_CARE_PROVIDER_SITE_OTHER): Payer: Medicare Other | Admitting: Cardiology

## 2014-02-04 VITALS — BP 138/60 | HR 100 | Ht 68.0 in | Wt 327.0 lb

## 2014-02-04 DIAGNOSIS — I509 Heart failure, unspecified: Secondary | ICD-10-CM

## 2014-02-04 DIAGNOSIS — R0989 Other specified symptoms and signs involving the circulatory and respiratory systems: Secondary | ICD-10-CM

## 2014-02-04 DIAGNOSIS — I10 Essential (primary) hypertension: Secondary | ICD-10-CM

## 2014-02-04 DIAGNOSIS — R0609 Other forms of dyspnea: Secondary | ICD-10-CM

## 2014-02-04 DIAGNOSIS — E785 Hyperlipidemia, unspecified: Secondary | ICD-10-CM

## 2014-02-04 DIAGNOSIS — I2510 Atherosclerotic heart disease of native coronary artery without angina pectoris: Secondary | ICD-10-CM

## 2014-02-04 DIAGNOSIS — J449 Chronic obstructive pulmonary disease, unspecified: Secondary | ICD-10-CM

## 2014-02-04 DIAGNOSIS — I5032 Chronic diastolic (congestive) heart failure: Secondary | ICD-10-CM

## 2014-02-04 MED ORDER — ATORVASTATIN CALCIUM 80 MG PO TABS: 80.0000 mg | ORAL_TABLET | Freq: Every day | ORAL | Status: DC

## 2014-02-04 NOTE — Progress Notes (Signed)
[address] [place], [place]  [address] Phone: [phone] Fax:  [phone]  Date:  02/04/2014   ID:  [name], DOB 03-26-1946, MRN [id number]  PCP:  [name] Hurter, MD  Cardiologist:  Fransico Him, MD     History of Present Illness:  [name] is a 68 y.o. male with a history of O2 dependent COPD, HTN, dyslipidemia, morbid obesity who presented recently   for evaluation of  increased SOB over the past few months. He denied any chest pain. He was able to walk around the house but could not go shopping without using an electric cart. He had to sleep 50% of the night in a chair. He would wake up around 2-4am with SOB and had to go sleep in the chair. He has chronic LE edema which is controlled with TED hose stockings and diuretic. He denies any palpitations, dizziness or syncope. He thinks he has OSA but says he does not have the money to pay for it. 2D echo showed normal LVF with moderate LVF and diastolic dysfunction. He recently underwent nuclear stress test which showed a large partially fixed defect in the inferoapical and inferoseptal regions with peri infarct ischemia and apical dyskinesis. He had a cath done in  09/2013 showing severely diseased and chronically occluded LAD and diagonal with right to left collaterals, mild disease in the left circ and moderate disease in the mid RCA.  He was started on Imdur and Plavix and medical therapy was recommended by Dr. Irish Lack with consideration of PCI of CTO of the LAD if he had CP resistant to medical therapy.  He presents back today for followup.  He is doing well.  He denies any chest pain but his SOB persists.  He says he still cannot lay down to sleep at night.  He has never seen a Pulmonologist for COPD.      Wt Readings from Last 3 Encounters:  02/04/14 327 lb (148.326 kg)  11/05/13 327 lb (148.326 kg)  10/26/13 315 lb (142.883 kg)     Past Medical History  Diagnosis Date  . DIABETES MELLITUS, TYPE II  06/24/2007  . HYPERLIPIDEMIA 06/24/2007  . HYPERTENSION 05/01/2007  . Hypoxemia 01/27/2010  . OBESITY 09/02/2008  . OSTEOARTHRITIS 05/01/2007  . UNSPEC COMBINED SYSTOLIC&DIASTOLIC HEART FAILURE 5/73/2202    pt denies  . COPD (chronic obstructive pulmonary disease)   . Asthma     as a child  . Cataract   . OSA (obstructive sleep apnea)     not tested  . History of kidney stones   . Coronary artery disease     Current Outpatient Prescriptions  Medication Sig Dispense Refill  . aspirin 81 MG tablet Take 81 mg by mouth daily.        Marland Kitchen atorvastatin (LIPITOR) 40 MG tablet TAKE ONE TABLET BY MOUTH ONCE DAILY  30 tablet  0  . beta carotene w/minerals (OCUVITE) tablet Take 1 tablet by mouth daily.      . cetirizine (ZYRTEC) 10 MG tablet Take 10 mg by mouth daily.      . clopidogrel (PLAVIX) 75 MG tablet Take 1 tablet (75 mg total) by mouth daily with breakfast.  30 tablet  11  . clotrimazole-betamethasone (LOTRISONE) cream Apply 1 application topically 2 (two) times daily as needed (for itching).      Marland Kitchen diltiazem (CARDIZEM CD) 120 MG 24 hr capsule TAKE ONE CAPSULE BY MOUTH ONCE DAILY  30 capsule  0  .  glucose blood (FREESTYLE LITE) test strip 1 each by Other route 3 (three) times daily. Use as instructed  100 each  11  . HYDROcodone-acetaminophen (VICODIN) 5-500 MG per tablet Take 1 tablet by mouth daily as needed for pain.      Marland Kitchen insulin regular (NOVOLIN R,HUMULIN R) 100 units/mL injection Inject 32 Units into the skin 3 (three) times daily before meals.      . isosorbide mononitrate (IMDUR) 30 MG 24 hr tablet Take 1 tablet (30 mg total) by mouth daily.  30 tablet  11  . Lancets (FREESTYLE) lancets 1 each by Other route 3 (three) times daily. Use as instructed  100 each  11  . lisinopril (PRINIVIL,ZESTRIL) 40 MG tablet Take 40 mg by mouth daily.      . metFORMIN (GLUCOPHAGE) 1000 MG tablet Take 1 tablet (1,000 mg total) by mouth 2 (two) times daily with a meal.  60 tablet  5  . NOVOLIN N RELION 100  UNIT/ML injection INJECT 70 UNITS SUBCUTANEOUSLY TWICE DAILY  10 mL  5  . tobramycin-dexamethasone (TOBRADEX) ophthalmic solution Place 1 drop into both eyes daily.      Marland Kitchen torsemide (DEMADEX) 20 MG tablet Take 20 mg by mouth daily.       No current facility-administered medications for this visit.    Allergies:   No Known Allergies  Social History:  The patient  reports that he quit smoking about 30 years ago. His smoking use included Cigarettes. He has a 45 pack-year smoking history. His smokeless tobacco use includes Chew. He reports that he does not drink alcohol or use illicit drugs.   Family History:  The patient's family history includes Alcohol abuse in his father; Aneurysm in his mother. There is no history of Colon cancer.   ROS:  Please see the history of present illness.      All other systems reviewed and negative.   PHYSICAL EXAM: VS:  BP 138/60  Pulse 100  Ht 5\' 8"  (1.727 m)  Wt 327 lb (148.326 kg)  BMI 49.73 kg/m2 Well nourished, well developed, in no acute distress HEENT: normal Neck: no JVD Cardiac:  normal S1, S2; RRR; no murmur Lungs:  clear to auscultation bilaterally, no wheezing, rhonchi or rales Abd: soft, nontender, no hepatomegaly Ext: no edema Skin: warm and dry Neuro:  CNs 2-12 intact, no focal abnormalities noted      ASSESSMENT AND PLAN:  1. Coronary artery disease: Chronic total occlusion of the LAD. He is not having significant symptoms that he attributes to ischemia such as CP. His shortness of breath is attributed mostly to COPD. Overall, he feels like he is doing okay except for his SOB. Dr. Irish Lack discussed the risks and benefits of revascularization of chronic total occlusion at the last OV. He is not interested in taking the risks because he does not feel that bad. If his symptoms change, he'll let us know. Continue aggressive secondary prevention and medical therapy.   - continue ASA/Plavix/imdur 2. Chronic SOB which is most likely secondary  to COPD/morbid obesity/sedentary state and probable OSA - I have recommended that we send him to pulmonology for evaluation. 3. HTN well controlled   - continue Lisinopril/Diltiazem 4. Dyslipidemia - LDL not at goal - last check was 82   - increase Atorvastatin to 80mg  daily   - recheck fasting lipids/ALT in 6 weeks 5. O2 dependent COPD - never evaluated by pulmonary 6. Probable OSA but patient refuses sleep study due to cost 7.  Chronic diastolic CHF appears compensated - his BNP has been normal on several checks  Followup with me in 6 months     Signed, Fransico Him, MD 02/04/2014 2:27 PM

## 2014-02-04 NOTE — Patient Instructions (Addendum)
Your physician has recommended you make the following change in your medication: Increase atorvastatin to 80 Mg 1 tablet daily  You have been referred to a Pulmonologist. Check out will set you up with this before you leave today.   Your physician recommends that you return for lab work in: 6 weeks on May 21st 2015. Please remember to Fast prior to [place] wants you to follow-up in: 6 months with Dr Mallie Snooks will receive a reminder letter in the mail two months in advance. If you don't receive a letter, please call our office to schedule the follow-up appointment.

## 2014-02-14 NOTE — Assessment & Plan Note (Signed)
Advised to lose weight Check labs today

## 2014-02-14 NOTE — Assessment & Plan Note (Signed)
Controlled Continue same meds Yearly lipid panel

## 2014-02-14 NOTE — Progress Notes (Signed)
patient comes in for followup of multiple medical problems including type 2 diabetes, hyperlipidemia, hypertension. The patient does not check blood sugar or blood pressure at home. The patetient does not follow an exercise or diet program. The patient denies any polyuria, polydipsia.  In the past the patient has gone to diabetic treatment center. The patient is tolerating medications  Without difficulty. The patient does admit to medication compliance.   Also pt with chronic diastolic CHF- Home O2  Past Medical History  Diagnosis Date  . DIABETES MELLITUS, TYPE II 06/24/2007  . HYPERLIPIDEMIA 06/24/2007  . HYPERTENSION 05/01/2007  . Hypoxemia 01/27/2010  . OBESITY 09/02/2008  . OSTEOARTHRITIS 05/01/2007  . UNSPEC COMBINED SYSTOLIC&DIASTOLIC HEART FAILURE 06/15/2992    pt denies  . COPD (chronic obstructive pulmonary disease)   . Asthma     as a child  . Cataract   . OSA (obstructive sleep apnea)     not tested  . History of kidney stones   . Coronary artery disease     History   Social History  . Marital Status: Married    Spouse Name: N/A    Number of Children: N/A  . Years of Education: N/A   Occupational History  . Retired    Social History Main Topics  . Smoking status: Former Smoker -- 1.50 packs/day for 30 years    Types: Cigarettes    Quit date: 10/30/1983  . Smokeless tobacco: Current User    Types: Chew  . Alcohol Use: No  . Drug Use: No  . Sexual Activity: Not on file   Other Topics Concern  . Not on file   Social History Narrative   Coffee daily     Past Surgical History  Procedure Laterality Date  . Appendectomy    . Wrist surgery      right fx  . Shoulder surgery      right fx  . Back surgery    . Carpal tunnel release Left   . Cataract extraction w/phaco Right 04/29/2013    Procedure: CATARACT EXTRACTION PHACO AND INTRAOCULAR LENS PLACEMENT (IOC);  Surgeon: Adonis Brook, MD;  Location: [facility];  Service: Ophthalmology;  Laterality: Right;    Family  History  Problem Relation Age of Onset  . Aneurysm Mother   . Alcohol abuse Father   . Colon cancer Neg Hx     No Known Allergies  Current Outpatient Prescriptions on File Prior to Visit  Medication Sig Dispense Refill  . aspirin 81 MG tablet Take 81 mg by mouth daily.        Marland Kitchen atorvastatin (LIPITOR) 80 MG tablet Take 1 tablet (80 mg total) by mouth daily at 6 PM.  30 tablet  6  . beta carotene w/minerals (OCUVITE) tablet Take 1 tablet by mouth daily.      . cetirizine (ZYRTEC) 10 MG tablet Take 10 mg by mouth daily.      . clopidogrel (PLAVIX) 75 MG tablet Take 1 tablet (75 mg total) by mouth daily with breakfast.  30 tablet  11  . clotrimazole-betamethasone (LOTRISONE) cream Apply 1 application topically 2 (two) times daily as needed (for itching).      Marland Kitchen diltiazem (CARDIZEM CD) 120 MG 24 hr capsule TAKE ONE CAPSULE BY MOUTH ONCE DAILY  30 capsule  0  . glucose blood (FREESTYLE LITE) test strip 1 each by Other route 3 (three) times daily. Use as instructed  100 each  11  . HYDROcodone-acetaminophen (VICODIN) 5-500 MG per tablet  Take 1 tablet by mouth daily as needed for pain.      Marland Kitchen insulin regular (NOVOLIN R,HUMULIN R) 100 units/mL injection Inject 32 Units into the skin 3 (three) times daily before meals.      . isosorbide mononitrate (IMDUR) 30 MG 24 hr tablet Take 1 tablet (30 mg total) by mouth daily.  30 tablet  11  . Lancets (FREESTYLE) lancets 1 each by Other route 3 (three) times daily. Use as instructed  100 each  11  . lisinopril (PRINIVIL,ZESTRIL) 40 MG tablet Take 40 mg by mouth daily.      . metFORMIN (GLUCOPHAGE) 1000 MG tablet Take 1 tablet (1,000 mg total) by mouth 2 (two) times daily with a meal.  60 tablet  5  . NOVOLIN N RELION 100 UNIT/ML injection INJECT 70 UNITS SUBCUTANEOUSLY TWICE DAILY  10 mL  5  . tobramycin-dexamethasone (TOBRADEX) ophthalmic solution Place 1 drop into both eyes daily.      Marland Kitchen torsemide (DEMADEX) 20 MG tablet Take 20 mg by mouth daily.        No current facility-administered medications on file prior to visit.     patient denies chest pain, , orthopnea. Denies lower extremity edema, abdominal pain, change in appetite, change in bowel movements. Patient denies rashes, musculoskeletal complaints. No other specific complaints in a complete review of systems except he does note pruritus of his arms and back at times. He has chronic shortness of breath.    Reviewed vitals    well-developed well-nourished male in no acute distress. HEENT exam atraumatic, normocephalic, neck supple without jugular venous distention. Chest clear to auscultation cardiac exam S1-S2 are regular. Abdominal exam overweight with bowel sounds, soft and nontender. Extremities no edema. Neurologic exam is alert  Uses a cane

## 2014-02-14 NOTE — Assessment & Plan Note (Signed)
Clinically doing ok He desperately needs to lose weight

## 2014-02-15 ENCOUNTER — Encounter: Payer: Self-pay | Admitting: Internal Medicine

## 2014-02-15 ENCOUNTER — Ambulatory Visit (INDEPENDENT_AMBULATORY_CARE_PROVIDER_SITE_OTHER): Payer: Medicare Other | Admitting: Internal Medicine

## 2014-02-15 VITALS — BP 132/60 | HR 102 | Temp 98.0°F | Ht 68.0 in | Wt 328.0 lb

## 2014-02-15 DIAGNOSIS — I5032 Chronic diastolic (congestive) heart failure: Secondary | ICD-10-CM

## 2014-02-15 DIAGNOSIS — I509 Heart failure, unspecified: Secondary | ICD-10-CM

## 2014-02-15 DIAGNOSIS — E119 Type 2 diabetes mellitus without complications: Secondary | ICD-10-CM

## 2014-02-15 DIAGNOSIS — E785 Hyperlipidemia, unspecified: Secondary | ICD-10-CM

## 2014-02-15 LAB — BASIC METABOLIC PANEL
BUN: 19 mg/dL (ref 6–23)
CALCIUM: 9.1 mg/dL (ref 8.4–10.5)
CO2: 28 mEq/L (ref 19–32)
CREATININE: 0.9 mg/dL (ref 0.4–1.5)
Chloride: 103 mEq/L (ref 96–112)
GFR: 95.09 mL/min (ref >60.00)
Glucose, Bld: 92 mg/dL (ref 70–99)
Potassium: 4.2 mEq/L (ref 3.5–5.1)
Sodium: 139 mEq/L (ref 135–145)

## 2014-02-15 LAB — MICROALBUMIN / CREATININE URINE RATIO
Creatinine,U: 98.5 mg/dL
MICROALB UR: 4.7 mg/dL — AB (ref 0.0–1.9)
Microalb Creat Ratio: 4.8 mg/g (ref 0.0–30.0)

## 2014-02-15 LAB — HEMOGLOBIN A1C: Hgb A1c MFr Bld: 6.4 % (ref 4.6–6.5)

## 2014-02-15 LAB — HM DIABETES FOOT EXAM

## 2014-02-15 NOTE — Progress Notes (Signed)
Pre visit review using our clinic review tool, if applicable. No additional management support is needed unless otherwise documented below in the visit note. 

## 2014-02-18 ENCOUNTER — Telehealth: Payer: Self-pay

## 2014-02-18 NOTE — Telephone Encounter (Signed)
Pt states Dr. Leanne Chang was trying to figure out which medication was causing him to have a rash.  Pt would like to know.  Pt is aware Dr. Leanne Chang will not return in the office until 02/22/14.  Pls advise.

## 2014-02-18 NOTE — Telephone Encounter (Signed)
Called and spoke with pt and pt states he was suppose

## 2014-02-19 NOTE — Telephone Encounter (Signed)
It's unclear to me Let's have him see dermatology

## 2014-02-19 NOTE — Telephone Encounter (Signed)
Pt declined to see Dermatologist.  He will call back if he changes his mind.  He states his copay it to high

## 2014-02-22 ENCOUNTER — Telehealth: Payer: Self-pay | Admitting: Internal Medicine

## 2014-02-22 NOTE — Telephone Encounter (Signed)
Pt would like to switch to dr fry due to dr swords unavailable. Dr Sarajane Jews also see pt daughter danielle royster

## 2014-02-22 NOTE — Telephone Encounter (Signed)
Sorry but I am too full  

## 2014-02-23 NOTE — Telephone Encounter (Signed)
Pt will go to [facility] °

## 2014-02-23 NOTE — Telephone Encounter (Signed)
Per Dr Leanne Chang they can switch to Dr Yong Channel

## 2014-02-24 ENCOUNTER — Encounter: Payer: Self-pay | Admitting: Pulmonary Disease

## 2014-02-24 ENCOUNTER — Ambulatory Visit (INDEPENDENT_AMBULATORY_CARE_PROVIDER_SITE_OTHER): Payer: Medicare Other | Admitting: Pulmonary Disease

## 2014-02-24 VITALS — BP 108/70 | HR 125 | Temp 98.6°F | Ht 69.0 in | Wt 328.4 lb

## 2014-02-24 DIAGNOSIS — G4733 Obstructive sleep apnea (adult) (pediatric): Secondary | ICD-10-CM | POA: Insufficient documentation

## 2014-02-24 DIAGNOSIS — R0609 Other forms of dyspnea: Secondary | ICD-10-CM

## 2014-02-24 DIAGNOSIS — R0989 Other specified symptoms and signs involving the circulatory and respiratory systems: Secondary | ICD-10-CM

## 2014-02-24 NOTE — Assessment & Plan Note (Signed)
The patient's dyspnea on exertion is certainly multifactorial. He is morbidly obese and deconditioned, has known chronic diastolic heart failure, and may have COPD associated with his long history of tobacco use. He has never had pulmonary function studies, and these will need to be done for further evaluation. In the meantime, I've asked the patient to work aggressively on weight loss and conditioning, and to monitor his fluid balance closely along with decreased intake of salt.

## 2014-02-24 NOTE — Patient Instructions (Signed)
Will schedule for breathing studies, and see you back the same day for review. Work on weight loss and conditioning. Will check and see if your insurance will cover a home sleep test.

## 2014-02-24 NOTE — Progress Notes (Signed)
   Subjective:    Patient ID: [name], male    DOB: [birth year], 68 y.o.   MRN: [id number]  HPI The patient is a 68 year old male who I've been asked to see for dyspnea on exertion and chronic respiratory failure. The patient has been on chronic oxygen for a few years, and admits to having dyspnea on exertion since that time. He feels that his symptoms have been progressive in nature. He describes a less than one block dyspnea on exertion at a moderate pace on flat ground, and will also get winded walking up a flight of stairs. He will also get winded bringing groceries in from the car. He describes classic orthopnea, and he feels this has been getting worse. He does admit to loud snoring and his wife has witnessed apneas during the night. However, he has not been willing to have a formal sleep study because of cost. He has a history of smoking one half pack per day for 30 years, but has not smoked since 1985. He denies any cough or mucus production. He has a history of chronic lower extremity edema, but feels that it has been fairly well managed. The patient states that his weight has been neutral over the last one to 2 years.  He also has a history of chronic diastolic heart failure, and has edema on exam today. He is had a recent chest x-ray last summer that showed cardiomegaly as well as chronic changes appear   Review of Systems  Constitutional: Negative for fever and unexpected weight change.  HENT: Negative for congestion, dental problem, ear pain, nosebleeds, postnasal drip, rhinorrhea, sinus pressure, sneezing, sore throat and trouble swallowing.   Eyes: Negative for redness and itching.  Respiratory: Positive for shortness of breath. Negative for cough, chest tightness and wheezing.   Cardiovascular: Negative for palpitations and leg swelling.  Gastrointestinal: Negative for nausea and vomiting.  Genitourinary: Negative for dysuria.  Musculoskeletal: Negative for joint swelling.   Skin: Negative for rash.  Neurological: Negative for headaches.  Hematological: Does not bruise/bleed easily.  Psychiatric/Behavioral: Negative for dysphoric mood. The patient is not nervous/anxious.        Objective:   Physical Exam Constitutional:  Obese male, no acute distress  HENT:  Nares patent without discharge  Oropharynx without exudate, palate and uvula are thick and elongated.   Eyes:  Perrla, eomi, no scleral icterus  Neck:  No JVD, no TMG  Cardiovascular:  Normal rate, regular rhythm, no rubs or gallops.  No murmurs        Intact distal pulses but decreased.   Pulmonary :  Normal breath sounds, no stridor or respiratory distress   No rales, rhonchi, or wheezing  Abdominal:  Soft, nondistended, bowel sounds present.  No tenderness noted.   Musculoskeletal:  1+ lower extremity edema noted.  Lymph Nodes:  No cervical lymphadenopathy noted  Skin:  No cyanosis noted  Neurologic:  Alert, appropriate, moves all 4 extremities without obvious deficit.         Assessment & Plan:

## 2014-02-24 NOTE — Assessment & Plan Note (Signed)
The patient's history is very suggestive of obstructive sleep apnea, but he has not wanted to do a formal NPSG due to cost.  Will check on the feasibility of home sleep testing, and whether this will be covered.

## 2014-02-26 ENCOUNTER — Telehealth: Payer: Self-pay

## 2014-02-26 NOTE — Telephone Encounter (Signed)
Relevant patient education mailed to patient.  

## 2014-02-27 ENCOUNTER — Other Ambulatory Visit: Payer: Self-pay | Admitting: Cardiology

## 2014-03-09 ENCOUNTER — Telehealth: Payer: Self-pay | Admitting: Pulmonary Disease

## 2014-03-09 NOTE — Telephone Encounter (Signed)
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

## 2014-03-09 NOTE — Telephone Encounter (Signed)
I called and spoke with pt. Made him aware. He stated he called [facility] and reports it does require PA. He wants all the "DUCKS IN ROW" before we do anything. Please advise thanks

## 2014-03-09 NOTE — Telephone Encounter (Signed)
Spoke to pt and gave him the dec # for his HST he will call [employer] and ck himself and call back if needed [name]

## 2014-03-09 NOTE — Telephone Encounter (Signed)
Willey ordered home sleep test 02/24/14. Please advise PCC's if we know if this was approved by his insurance? thanks

## 2014-03-18 ENCOUNTER — Ambulatory Visit: Payer: Medicare Other | Admitting: Pulmonary Disease

## 2014-03-18 ENCOUNTER — Ambulatory Visit (HOSPITAL_COMMUNITY)
Admission: RE | Admit: 2014-03-18 | Discharge: 2014-03-18 | Disposition: A | Discharge status: Home / Self Care | Payer: Medicare Other | Source: Ambulatory Visit | Attending: Pulmonary Disease | Admitting: Pulmonary Disease

## 2014-03-18 ENCOUNTER — Other Ambulatory Visit (INDEPENDENT_AMBULATORY_CARE_PROVIDER_SITE_OTHER): Payer: Medicare Other

## 2014-03-18 DIAGNOSIS — R05 Cough: Secondary | ICD-10-CM | POA: Insufficient documentation

## 2014-03-18 DIAGNOSIS — R0989 Other specified symptoms and signs involving the circulatory and respiratory systems: Principal | ICD-10-CM | POA: Insufficient documentation

## 2014-03-18 DIAGNOSIS — R062 Wheezing: Secondary | ICD-10-CM | POA: Insufficient documentation

## 2014-03-18 DIAGNOSIS — R059 Cough, unspecified: Secondary | ICD-10-CM | POA: Insufficient documentation

## 2014-03-18 DIAGNOSIS — R0609 Other forms of dyspnea: Secondary | ICD-10-CM | POA: Insufficient documentation

## 2014-03-18 DIAGNOSIS — Z87891 Personal history of nicotine dependence: Secondary | ICD-10-CM | POA: Insufficient documentation

## 2014-03-18 DIAGNOSIS — E785 Hyperlipidemia, unspecified: Secondary | ICD-10-CM

## 2014-03-18 LAB — LIPID PANEL
Cholesterol: 129 mg/dL (ref 0–200)
HDL: 32.1 mg/dL — ABNORMAL LOW (ref >39.00)
LDL CALC: 25 mg/dL (ref 0–99)
Total CHOL/HDL Ratio: 4
Triglycerides: 360 mg/dL — ABNORMAL HIGH (ref 0.0–149.0)
VLDL: 72 mg/dL — AB (ref 0.0–40.0)

## 2014-03-18 LAB — ALT: ALT: 30 U/L (ref 0–53)

## 2014-03-18 MED ORDER — ALBUTEROL SULFATE (2.5 MG/3ML) 0.083% IN NEBU
2.5000 mg | INHALATION_SOLUTION | Freq: Once | RESPIRATORY_TRACT | Status: AC
  Administered 2014-03-18: 2.5 mg via RESPIRATORY_TRACT

## 2014-03-21 ENCOUNTER — Other Ambulatory Visit: Payer: Self-pay | Admitting: Internal Medicine

## 2014-03-21 LAB — PULMONARY FUNCTION TEST
DL/VA % pred: 101 %
DL/VA: 4.54 ml/min/mmHg/L
DLCO unc % pred: 72 %
DLCO unc: 21.42 ml/min/mmHg
FEF 25-75 POST: 3.12 L/s
FEF 25-75 Pre: 2.42 L/sec
FEF2575-%CHANGE-POST: 29 %
FEF2575-%PRED-PRE: 102 %
FEF2575-%Pred-Post: 132 %
FEV1-%Change-Post: 7 %
FEV1-%PRED-PRE: 72 %
FEV1-%Pred-Post: 77 %
FEV1-POST: 2.36 L
FEV1-Pre: 2.2 L
FEV1FVC-%CHANGE-POST: -1 %
FEV1FVC-%Pred-Pre: 111 %
FEV6-%Change-Post: 8 %
FEV6-%Pred-Post: 74 %
FEV6-%Pred-Pre: 68 %
FEV6-PRE: 2.67 L
FEV6-Post: 2.9 L
FEV6FVC-%Change-Post: 0 %
FEV6FVC-%Pred-Post: 106 %
FEV6FVC-%Pred-Pre: 106 %
FVC-%Change-Post: 8 %
FVC-%Pred-Post: 70 %
FVC-%Pred-Pre: 64 %
FVC-Post: 2.9 L
FVC-Pre: 2.67 L
POST FEV1/FVC RATIO: 81 %
POST FEV6/FVC RATIO: 100 %
PRE FEV1/FVC RATIO: 82 %
Pre FEV6/FVC Ratio: 100 %
RV % pred: 89 %
RV: 2.08 L
TLC % pred: 80 %
TLC: 5.31 L

## 2014-03-22 ENCOUNTER — Other Ambulatory Visit: Payer: Self-pay | Admitting: Internal Medicine

## 2014-03-27 ENCOUNTER — Other Ambulatory Visit: Payer: Self-pay | Admitting: Internal Medicine

## 2014-03-31 ENCOUNTER — Encounter: Payer: Self-pay | Admitting: Pulmonary Disease

## 2014-03-31 ENCOUNTER — Ambulatory Visit (INDEPENDENT_AMBULATORY_CARE_PROVIDER_SITE_OTHER): Payer: Medicare Other | Admitting: Pulmonary Disease

## 2014-03-31 VITALS — BP 108/68 | HR 102 | Temp 98.4°F | Ht 69.0 in | Wt 332.4 lb

## 2014-03-31 DIAGNOSIS — G4733 Obstructive sleep apnea (adult) (pediatric): Secondary | ICD-10-CM

## 2014-03-31 DIAGNOSIS — R0609 Other forms of dyspnea: Secondary | ICD-10-CM

## 2014-03-31 DIAGNOSIS — R0989 Other specified symptoms and signs involving the circulatory and respiratory systems: Secondary | ICD-10-CM

## 2014-03-31 NOTE — Progress Notes (Signed)
   Subjective:    Patient ID: [name], male    DOB: 04-20-1946, 68 y.o.   MRN: [id number]  HPI The patient comes in today for followup of his recent PFTs, as part of a workup for dyspnea on exertion. He was found to have a normal FEV1 percent, and no response to bronchodilator. He had no restriction by lung volumes, and only minimal air trapping. His diffusion capacity was only mildly reduced at 72% of predicted. It did correct to normal with alveolar volume adjustment. I have reviewed the results in detail with the patient, and answered all of his questions.   Review of Systems  Constitutional: Negative for fever and unexpected weight change.  HENT: Negative for congestion, dental problem, ear pain, nosebleeds, postnasal drip, rhinorrhea, sinus pressure, sneezing, sore throat and trouble swallowing.   Eyes: Negative for redness and itching.  Respiratory: Positive for cough and shortness of breath. Negative for chest tightness and wheezing.   Cardiovascular: Negative for palpitations and leg swelling.  Gastrointestinal: Negative for nausea and vomiting.  Genitourinary: Negative for dysuria.  Musculoskeletal: Negative for joint swelling.  Skin: Negative for rash.  Neurological: Negative for headaches.  Hematological: Does not bruise/bleed easily.  Psychiatric/Behavioral: Negative for dysphoric mood. The patient is not nervous/anxious.        Objective:   Physical Exam Morbidly obese male in no acute distress Nose without purulence or discharge noted Neck without lymphadenopathy or thyromegaly Lower extremities with edema noted, no cyanosis Alert and oriented, moves all 4 extremities.       Assessment & Plan:

## 2014-03-31 NOTE — Assessment & Plan Note (Signed)
The patient cannot afford to have a sleep study at the sleep Center, and I have offered to do a home sleep test and get it certified by his insurance company. The patient would like to do this on his own, and will let me know if he wishes to proceed.

## 2014-03-31 NOTE — Assessment & Plan Note (Signed)
Surprisingly, the patient has no airflow obstruction by spirometry, and only minimal air trapping. Therefore, he really does not have significant COPD. He has no restriction on his lung volumes, and his DLCO is only mildly reduced at 72% of predicted. This tells me he probably has very little emphysema as well. The patient is very frustrated about his dyspnea on exertion, but I have explained that I have found nothing from a lung standpoint to address that may result in improved symptoms. I asked him to continue on his oxygen, and to work on aggressive weight loss. He will also followup with cardiology for his chronic diastolic heart failure.

## 2014-03-31 NOTE — Patient Instructions (Signed)
You have very little copd by your breathing studies today. Continue oxygen, work on weight loss, and work with cardiology to keep your fluid overload under control.  Would be happy to see you again if new issues arise from a breathing standpoint.

## 2014-04-08 ENCOUNTER — Telehealth: Payer: Self-pay | Admitting: Cardiology

## 2014-04-08 NOTE — Telephone Encounter (Signed)
New message     Talk to a nurse about getting a new medication from Dr Radford Pax.  This is a follow up from a conversation about 2wks ago.

## 2014-04-08 NOTE — Telephone Encounter (Signed)
Called pt back. See lipid notes.

## 2014-04-09 ENCOUNTER — Ambulatory Visit (INDEPENDENT_AMBULATORY_CARE_PROVIDER_SITE_OTHER): Payer: Medicare Other | Admitting: Family Medicine

## 2014-04-09 ENCOUNTER — Encounter: Payer: Self-pay | Admitting: Family Medicine

## 2014-04-09 VITALS — HR 111 | Temp 98.7°F | Wt 328.0 lb

## 2014-04-09 DIAGNOSIS — L509 Urticaria, unspecified: Secondary | ICD-10-CM

## 2014-04-09 MED ORDER — PREDNISONE 10 MG PO TABS: ORAL_TABLET | ORAL | Status: DC

## 2014-04-09 MED ORDER — HYDROXYZINE HCL 25 MG PO TABS: 25.0000 mg | ORAL_TABLET | ORAL | Status: DC | PRN

## 2014-04-09 NOTE — Progress Notes (Signed)
   Subjective:    Patient ID: [name], male    DOB: April 30, 1946, 68 y.o.   MRN: [id number]  HPI Here for 2 months of intermittent itching all over the body. Sometimes there is a visible rash but usually not. He takes Zyrtec daily. He has changed his soaps and detergents to no avail. He thinks this is from "nerves". His daughter and the 2 grandchildren live with him and his wife, and the full house often makes him feel anxious and nervous. He took one of his daughter's "nerve pills" last night and he slept better than he has for a long time. He had an episode of hives like this while in his 20's, when he dealt with a lot of stress around his divorce.    Review of Systems  Constitutional: Negative.   Respiratory: Negative.   Cardiovascular: Negative.   Skin: Positive for rash.  Psychiatric/Behavioral: Positive for sleep disturbance. Negative for suicidal ideas, hallucinations, self-injury, decreased concentration and agitation. The patient is nervous/anxious.        Objective:   Physical Exam  Constitutional: He appears well-developed and well-nourished.  Cardiovascular: Normal rate, regular rhythm, normal heart sounds and intact distal pulses.   Pulmonary/Chest: Effort normal and breath sounds normal.  Skin: No rash noted. No erythema.  Psychiatric: His behavior is normal. Thought content normal.  Anxious           Assessment & Plan:  This is hives, and I agree that anxiety is probably the cause of it. He will stay on Zyrtec. Add a 16 day taper of Prednisone. Use Hydroxyzine every 4 hours prn which will relieve the itching as well as calm the anxiety. Recheck prn

## 2014-04-09 NOTE — Telephone Encounter (Signed)
Please address itching

## 2014-04-09 NOTE — Telephone Encounter (Signed)
Ysidro Evert is out of the office until Monday 04/12/14.  No pharmacist in clinic today.

## 2014-04-09 NOTE — Telephone Encounter (Signed)
Please address - patient is still having a rash and itching

## 2014-04-09 NOTE — Progress Notes (Signed)
Pre visit review using our clinic review tool, if applicable. No additional management support is needed unless otherwise documented below in the visit note. 

## 2014-04-09 NOTE — Telephone Encounter (Addendum)
Pt called back today stating he was accidentally taking 2 different diltiazems. They are both 120 mg but one is pink and one is gray (he has done this for 2 weeks now). Pt will go back to one a day. FYi to [facility] and Dr. Radford Pax. Pt is still itching all over. See lipid clinic note.

## 2014-04-12 ENCOUNTER — Telehealth: Payer: Self-pay | Admitting: *Deleted

## 2014-04-12 ENCOUNTER — Other Ambulatory Visit: Payer: Self-pay | Admitting: General Surgery

## 2014-04-12 DIAGNOSIS — E785 Hyperlipidemia, unspecified: Secondary | ICD-10-CM

## 2014-04-12 MED ORDER — ATORVASTATIN CALCIUM 80 MG PO TABS: 40.0000 mg | ORAL_TABLET | Freq: Every day | ORAL | Status: DC

## 2014-04-12 NOTE — Telephone Encounter (Signed)
Patient still with itching - should we stop Lipitor for send to [facility]?

## 2014-04-12 NOTE — Telephone Encounter (Signed)
Patient recently saw MD and was put on a 2 week course of prednisone.  Have him continue lipitor 40 mg qd for now, and continue what he is going with diltiazem.  Recheck lipid panel / hepatic panel in 8 weeks.  If rash hasn't improved after finishing prednisone have him call us back and will likely do a trial off lipitor.

## 2014-04-12 NOTE — Telephone Encounter (Signed)
Has he finished his prednisone

## 2014-04-12 NOTE — Telephone Encounter (Signed)
Patient called very upset that he spoke with ???Kristen this AM and she told him that someone would call him back by noon, and it is 1:00. Can you please call him? He stated that he has three different types of diltiazem on hand and does not know which one he is to be taking, and also wants to speak with you about his itching. Thanks, MI

## 2014-04-12 NOTE — Telephone Encounter (Signed)
PCP just put him on a 16 day taper of prednisone for hives.  If no improvement in urticaria after the prednisone, could consider stopping lipitor at that time.

## 2014-04-12 NOTE — Telephone Encounter (Signed)
He has not finished the prednisone yet

## 2014-04-12 NOTE — Telephone Encounter (Signed)
To Ysidro Evert to make aware of Itching.

## 2014-04-12 NOTE — Telephone Encounter (Signed)
Pt stated his Dr Rockey Situ him it was all to do with anxiety, rash has been going away for him. He was ok with staying on 40 MG of lipitor and is coming back in July to have labs drawn to make sure he is ok on that dosage.

## 2014-04-12 NOTE — Telephone Encounter (Signed)
Spoke with pt and made him aware of fact he should be taking diltiazem 120 MG/24 hr 1 daily.

## 2014-04-12 NOTE — Telephone Encounter (Signed)
Pt stated that he will continue with 40 MG the rash is going away. He blames it on nerves but is ok with staying with teh 40 right now. He will be coming back in Aug 10 th for lab work. He will continue his diltiazem 120 MG 1 tablet daily. He is aware that the brand can change the color and shape of the capsule.

## 2014-04-12 NOTE — Telephone Encounter (Signed)
Ok will stay on Lipitor for now then

## 2014-04-13 ENCOUNTER — Telehealth: Payer: Self-pay | Admitting: Internal Medicine

## 2014-04-13 NOTE — Telephone Encounter (Signed)
Call in a Medrol dose pack  

## 2014-04-13 NOTE — Telephone Encounter (Signed)
Pt was seen by dr. Sarajane Jews on Friday, and he informed him to let him know how the rx hydrOXYzine (ATARAX/VISTARIL) 25 MG tablet, pt states it only works for 4 hrs, but as soon as 4 hrs is up, he is itching really bad again. Dr. Sarajane Jews had stated he would prescribe something stronger if needed. Pt would like to get something a little stronger. Send to [facility] on [address].

## 2014-04-15 ENCOUNTER — Telehealth: Payer: Self-pay | Admitting: Internal Medicine

## 2014-04-15 NOTE — Telephone Encounter (Signed)
I spoke with pt and explained that I would forward the note, Dr. Sarajane Jews has already left for the day.

## 2014-04-15 NOTE — Telephone Encounter (Signed)
Patient Information:  Caller Name: [name]  Phone: [phone]  Patient: [name], [name]  Gender: Male  DOB: 1945/11/19  Age: 68 Years  PCP: Alysia Penna [facility])  Office Follow Up:  Does the office need to follow up with this patient?: Yes  Instructions For The Office: Please f/u with pt concerning medication Hydroxizine 25 mg, thank you.   Symptoms  Reason For Call & Symptoms: Pt states he was seen in the office  04/09/14 due to itching.  Rx for Hydroxizine 25 mg.  Pt states he was adviced to call back and let Dr Sarajane Jews know how the medication is working. Pt reports medication is working well during the day but he wakes up before daylight itching.  Reviewed Health History In EMR: Yes  Reviewed Medications In EMR: Yes  Reviewed Allergies In EMR: Yes  Reviewed Surgeries / Procedures: Yes  Date of Onset of Symptoms: 04/09/2014  Guideline(s) Used:  No Protocol Available - Information Only  Disposition Per Guideline:   Discuss with PCP and Callback by Nurse Today  Reason For Disposition Reached:   Nursing judgment  Advice Given:  Call Back If:  New symptoms develop  You become worse.  Patient Will Follow Care Advice:  YES

## 2014-04-15 NOTE — Telephone Encounter (Signed)
Pls advise.  

## 2014-04-16 NOTE — Telephone Encounter (Signed)
I spoke with pt and went over below information, he stated that he would schedule a office visit with his new doctor.

## 2014-04-16 NOTE — Telephone Encounter (Signed)
He may take a Hydroxyzine pill every 4 hours whenever he needs it. We also decided that anxiety is a big part of his problem, so I recommend he follow up with Dr. Leanne Chang ( or his new doctor) to get this treated

## 2014-04-20 ENCOUNTER — Encounter: Payer: Self-pay | Admitting: Family Medicine

## 2014-04-20 ENCOUNTER — Ambulatory Visit (INDEPENDENT_AMBULATORY_CARE_PROVIDER_SITE_OTHER): Payer: Medicare Other | Admitting: Family Medicine

## 2014-04-20 ENCOUNTER — Other Ambulatory Visit: Payer: Self-pay | Admitting: Internal Medicine

## 2014-04-20 VITALS — BP 131/70 | HR 87 | Temp 98.0°F | Resp 22 | Ht 69.0 in | Wt 328.0 lb

## 2014-04-20 DIAGNOSIS — F4322 Adjustment disorder with anxiety: Secondary | ICD-10-CM

## 2014-04-20 DIAGNOSIS — L299 Pruritus, unspecified: Secondary | ICD-10-CM

## 2014-04-20 MED ORDER — CLONAZEPAM 1 MG PO TABS: 1.0000 mg | ORAL_TABLET | Freq: Two times a day (BID) | ORAL | Status: DC | PRN

## 2014-04-20 MED ORDER — CITALOPRAM HYDROBROMIDE 20 MG PO TABS: 20.0000 mg | ORAL_TABLET | Freq: Every day | ORAL | Status: DC

## 2014-04-20 NOTE — Progress Notes (Signed)
Office Note 04/25/2014  CC:  Chief Complaint  Patient presents with  . itching    HPI:  [name] is a 68 y.o. White male who is here to transfer care, discuss itching. Patient's most recent primary MD: Dr. Leanne Chang. Old records in [facility] EMR were reviewed prior to or during today's visit.  Recent visit with Dr. Sarajane Jews at [facility] on 04/09/14, urticaria (stress trigger) dx'd and pt started on pred taper and hydroxyzine q4-6h.   He itches essentially on all areas of skin but DENIES ever having a rash, specifically NO lesions that look like hives. He says he is very stressed b/c his wife gets on his nerves b/c of her short term memory problems.  Says she has emotional outbursts that he has to deal with.  Says the itching has coincided with his wife's mental decline--the last 4 mo. No known contact allergens/irritants.  He has taken one of his daughter's 1mg  alprazolam at bedtime for a couple of nights and it has helped him rest significantly he says.  No daytime use of this med per pt.  Past Medical History  Diagnosis Date  . DIABETES MELLITUS, TYPE II 06/24/2007  . HYPERLIPIDEMIA 06/24/2007  . HYPERTENSION 05/01/2007  . Hypoxemia 01/27/2010  . OBESITY 09/02/2008  . OSTEOARTHRITIS 05/01/2007  . UNSPEC COMBINED SYSTOLIC&DIASTOLIC HEART FAILURE 05/07/6268    pt denies  . COPD (chronic obstructive pulmonary disease)   . Asthma     as a child  . Cataract   . OSA (obstructive sleep apnea)     not tested  . History of kidney stones   . Coronary artery disease     Past Surgical History  Procedure Laterality Date  . Appendectomy    . Wrist surgery      right fx  . Shoulder surgery      right fx  . Back surgery    . Carpal tunnel release Left   . Cataract extraction w/phaco Right 04/29/2013    Procedure: CATARACT EXTRACTION PHACO AND INTRAOCULAR LENS PLACEMENT (IOC);  Surgeon: Adonis Brook, MD;  Location: [place];  Service: Ophthalmology;  Laterality: Right;    Family History   Problem Relation Age of Onset  . Aneurysm Mother   . Alcohol abuse Father   . Colon cancer Neg Hx     History   Social History  . Marital Status: Married    Spouse Name: N/A    Number of Children: N/A  . Years of Education: N/A   Occupational History  . Retired    Social History Main Topics  . Smoking status: Former Smoker -- 1.50 packs/day for 30 years    Types: Cigarettes    Quit date: 10/30/1983  . Smokeless tobacco: Current User    Types: Chew  . Alcohol Use: No  . Drug Use: No  . Sexual Activity: Not on file   Other Topics Concern  . Not on file   Social History Narrative   Married, one son and one daughter.   His daughter and her two children live with him.   Coffee daily.  Former smoker.  No alcohol.    Outpatient Encounter Prescriptions as of 04/20/2014  Medication Sig  . aspirin 81 MG tablet Take 81 mg by mouth daily.    Marland Kitchen atorvastatin (LIPITOR) 80 MG tablet Take 0.5 tablets (40 mg total) by mouth daily at 6 PM.  . beta carotene w/minerals (OCUVITE) tablet Take 1 tablet by mouth daily.  . cetirizine (ZYRTEC) 10  MG tablet Take 10 mg by mouth daily.  . clopidogrel (PLAVIX) 75 MG tablet Take 1 tablet (75 mg total) by mouth daily with breakfast.  . clotrimazole-betamethasone (LOTRISONE) cream APPLY   TOPICALLY TO AFFECTED AREA TWICE DAILY  . diltiazem (CARDIZEM CD) 120 MG 24 hr capsule TAKE ONE CAPSULE BY MOUTH ONCE DAILY  . glucose blood (FREESTYLE LITE) test strip 1 each by Other route 3 (three) times daily. Use as instructed  . HYDROcodone-acetaminophen (VICODIN) 5-500 MG per tablet Take 1 tablet by mouth daily as needed for pain.  . hydrOXYzine (ATARAX/VISTARIL) 25 MG tablet Take 1 tablet (25 mg total) by mouth every 4 (four) hours as needed for anxiety or itching.  . isosorbide mononitrate (IMDUR) 30 MG 24 hr tablet Take 1 tablet (30 mg total) by mouth daily.  . Lancets (FREESTYLE) lancets 1 each by Other route 3 (three) times daily. Use as instructed  .  metFORMIN (GLUCOPHAGE) 1000 MG tablet Take 1 tablet (1,000 mg total) by mouth 2 (two) times daily with a meal.  . NOVOLIN N RELION 100 UNIT/ML injection INJECT 70 UNITS SUBCUTANEOUSLY TWICE DAILY  . NOVOLIN R RELION 100 UNIT/ML injection INJECT 32 UNITS SUBCUTANEOUSLY THREE TIMES A DAY WITH MEALS  . predniSONE (DELTASONE) 10 MG tablet Take 4 tabs a day for 4 days, then 3 tabs a day for 4 days, then 2 a day for 4 days, then 1 a day for 4 days then stop  . torsemide (DEMADEX) 20 MG tablet TAKE ONE TABLET BY MOUTH ONCE DAILY  . [DISCONTINUED] lisinopril (PRINIVIL,ZESTRIL) 40 MG tablet Take 40 mg by mouth daily.  . citalopram (CELEXA) 20 MG tablet Take 1 tablet (20 mg total) by mouth daily.  . clonazePAM (KLONOPIN) 1 MG tablet Take 1 tablet (1 mg total) by mouth 2 (two) times daily as needed for anxiety.    No Known Allergies  PE; Blood pressure 131/70, pulse 87, temperature 98 F (36.7 C), temperature source Temporal, resp. rate 22, height 5\' 9"  (1.753 m), weight 328 lb (148.78 kg), SpO2 96.00%. Gen: alert, wearing oxygen/, lucid thought and speech. Skin - no sores or suspicious lesions or rashes or color changes Lots of benign-appearing chronic skin lesions.  Some areas of excoriations are noted where pt has been scratching a lot.  Pertinent labs:  None today  ASSESSMENT AND PLAN:   Transfer pt;  Itchy skin As this has gone on it seems to be panning out that this is NOT associated with any rash. Will go ahead and have him finish out his steroid taper, may continue hydroxyzine q6 hours. Stop applying any topical preparations.    Adjustment disorder with anxious mood Chronic anxiety but WORSE lately, coincidental with his wife's worsening cognitive decline. With additional symptom of itching (? Of urticaria at some point in time but pt denies hx of rash with this itching). Will start citalopram 20mg  qd and clonazepam 1 mg, 1 bid prn. Therapeutic expectations and side effect profile  of medication discussed today.  Patient's questions answered.    An After Visit Summary was printed and given to the patient.  Return in about 4 weeks (around 05/18/2014) for 30 min appt f/u anxiety and itching.

## 2014-04-20 NOTE — Progress Notes (Signed)
Pre visit review using our clinic review tool, if applicable. No additional management support is needed unless otherwise documented below in the visit note. 

## 2014-04-21 ENCOUNTER — Other Ambulatory Visit: Payer: Self-pay | Admitting: Family Medicine

## 2014-04-21 MED ORDER — LISINOPRIL 40 MG PO TABS: ORAL_TABLET | ORAL | Status: DC

## 2014-04-25 DIAGNOSIS — F4322 Adjustment disorder with anxiety: Secondary | ICD-10-CM | POA: Insufficient documentation

## 2014-04-25 DIAGNOSIS — L299 Pruritus, unspecified: Secondary | ICD-10-CM | POA: Insufficient documentation

## 2014-04-25 NOTE — Assessment & Plan Note (Addendum)
Chronic anxiety but WORSE lately, coincidental with his wife's worsening cognitive decline. With additional symptom of itching (? Of urticaria at some point in time but pt denies hx of rash with this itching). Will start citalopram 20mg  qd and clonazepam 1 mg, 1 bid prn. Therapeutic expectations and side effect profile of medication discussed today.  Patient's questions answered.

## 2014-04-25 NOTE — Assessment & Plan Note (Signed)
As this has gone on it seems to be panning out that this is NOT associated with any rash. Will go ahead and have him finish out his steroid taper, may continue hydroxyzine q6 hours. Stop applying any topical preparations.

## 2014-04-27 ENCOUNTER — Telehealth: Payer: Self-pay | Admitting: Family Medicine

## 2014-04-27 DIAGNOSIS — L299 Pruritus, unspecified: Secondary | ICD-10-CM

## 2014-04-27 NOTE — Telephone Encounter (Signed)
Referral complete.

## 2014-04-27 NOTE — Telephone Encounter (Signed)
Patient Information:  Caller Name: Olden  Phone: [phone]  Patient: [name], [name]  Gender: Male  DOB: 15-Apr-1946  Age: 68 Years  PCP: Ricardo Jericho [facility])  Office Follow Up:  Does the office need to follow up with this patient?: Yes  Instructions For The Office: Please review - call to patient to direct care, medications or make appointment if MD feels he needs to be seen (is doing well now - Sx start after starting Rx and continuing Rx during the day).  RN Note:  Thinks he may be taking too much medication for itch but still needs it.  Asking next step in how to control itching, not bedoming so groggy and unsteady.  Symptoms  Reason For Call & Symptoms: Itching all over started about 3-4 months ago, gradually gotten worse.  Seen by Dr Sharlene Motts, started [facility] Q4Hours, took Predisone decreasing dosage that he has completed..  Seen last week Dr Anitra Lauth and addeded Citalopram and Clonazepam.  In the mornings he does well except the itching.  As he addes the medications during the day, becomes slower, unsteady, staggery, groggy.  Fell twice on Sunday 6/28.  Waist up has so much itching that he has scratched raw, developed scabs, scratched them off - sometimes scratching blood out..  Sleeps well at night except waking during the night scratching again.  Reviewed Health History In EMR: Yes  Reviewed Medications In EMR: Yes  Reviewed Allergies In EMR: Yes  Reviewed Surgeries / Procedures: Yes  Date of Onset of Symptoms: Unknown  Treatments Tried: cream for itching,  changed laundry soap  to baby formula after symptoms started. new tee shirts but not helping.  Treatments Tried Worked: No  Guideline(s) Used:  Itching - Widespread  Neurologic Deficit  Disposition Per Guideline:   See Within 2 Weeks in Office  Reason For Disposition Reached:   Itching is a chronic symptom (recurrent or ongoing AND present > 4 weeks)  Advice Given:  N/A  Patient Refused  Recommendation:  Patient Will Follow Up With Office Later  Will be glad to come into office if MD feels he needs to be seen.  Is about 10 minutes from office. Can be reached at [phone].

## 2014-04-27 NOTE — Telephone Encounter (Signed)
Please advise 

## 2014-04-27 NOTE — Telephone Encounter (Signed)
Per Dr. Anitra Lauth, I ordered a referral for pt to see an allergist.  Patient aware of referral.

## 2014-04-27 NOTE — Telephone Encounter (Signed)
Noted, agree

## 2014-05-03 ENCOUNTER — Telehealth: Payer: Self-pay | Admitting: *Deleted

## 2014-05-03 MED ORDER — DILTIAZEM HCL ER COATED BEADS 120 MG PO CP24: ORAL_CAPSULE | ORAL | Status: DC

## 2014-05-03 MED ORDER — METFORMIN HCL 1000 MG PO TABS: 1000.0000 mg | ORAL_TABLET | Freq: Two times a day (BID) | ORAL | Status: DC

## 2014-05-03 NOTE — Telephone Encounter (Signed)
Pt will need to have PCP refill or the doctor that follows his glucose refill medication.

## 2014-05-03 NOTE — Telephone Encounter (Signed)
rx sent in electronically to [facility] 

## 2014-05-03 NOTE — Telephone Encounter (Signed)
Please advise refill? 

## 2014-05-03 NOTE — Telephone Encounter (Signed)
[facility] 

## 2014-05-03 NOTE — Telephone Encounter (Signed)
Patient aware.

## 2014-05-03 NOTE — Telephone Encounter (Signed)
Patient requests metformin refill be sent to [facility]. Ok to refill? Please advise. Thanks, MI

## 2014-05-17 ENCOUNTER — Encounter: Payer: Self-pay | Admitting: Family Medicine

## 2014-05-18 ENCOUNTER — Encounter: Payer: Self-pay | Admitting: Family Medicine

## 2014-05-18 ENCOUNTER — Ambulatory Visit (INDEPENDENT_AMBULATORY_CARE_PROVIDER_SITE_OTHER): Payer: Medicare Other | Admitting: Family Medicine

## 2014-05-18 VITALS — BP 127/71 | HR 104 | Temp 98.0°F | Resp 20 | Ht 69.0 in | Wt 330.0 lb

## 2014-05-18 DIAGNOSIS — L299 Pruritus, unspecified: Secondary | ICD-10-CM

## 2014-05-18 DIAGNOSIS — F4322 Adjustment disorder with anxiety: Secondary | ICD-10-CM

## 2014-05-18 DIAGNOSIS — E119 Type 2 diabetes mellitus without complications: Secondary | ICD-10-CM

## 2014-05-18 MED ORDER — GABAPENTIN 300 MG PO CAPS: 300.0000 mg | ORAL_CAPSULE | Freq: Three times a day (TID) | ORAL | Status: DC

## 2014-05-18 NOTE — Progress Notes (Signed)
OFFICE VISIT  05/18/2014   CC:  Chief Complaint  Patient presents with  . Follow-up  . Pruritis   HPI:    Patient is a 68 y.o. Caucasian male who presents for 1 mo f/u itching and anxiety.  I reviewed his note from recent visit with Dr. Harold Hedge (allergist)+ labs done at his visit: metabolic panel and CBC normal.  RAST testing was MINIMALLY + for shrimp, dog dander, [place] grass, house dust, C albicans.  His IgE level was elevated (332, ULN is 180). He was kept on his antihistamines, dx was unspecified pruritic disorder.  Also brought glucose log: avg gluc 120s (fasting and PP). His insulin is NPH (Relion/otc) and Regular (relion/otc). Lab Results  Component Value Date   HGBA1C 6.4 02/15/2014  He is taking cetirizine 10mg  daily, also takes hydroxyzine every 6 hours. He washes in cetaphil and uses cetaphil lotion.  He has been on citalopram 20 mg qd and clonazepam bid and feels like this has helped some with anxiety but he is hard to interview, hardly ever gives a direct answer to my questions, perseverates on his itching.   Past Medical History  Diagnosis Date  . DIABETES MELLITUS, TYPE II 06/24/2007  . HYPERLIPIDEMIA 06/24/2007  . HYPERTENSION 05/01/2007  . Hypoxemia 01/27/2010  . OBESITY 09/02/2008  . OSTEOARTHRITIS 05/01/2007  . UNSPEC COMBINED SYSTOLIC&DIASTOLIC HEART FAILURE 0/25/8527    pt denies  . COPD (chronic obstructive pulmonary disease)   . Asthma     as a child  . Cataract   . OSA (obstructive sleep apnea)     not tested  . History of kidney stones   . Coronary artery disease     Past Surgical History  Procedure Laterality Date  . Appendectomy    . Wrist surgery      right fx  . Shoulder surgery      right fx  . Back surgery    . Carpal tunnel release Left   . Cataract extraction w/phaco Right 04/29/2013    Procedure: CATARACT EXTRACTION PHACO AND INTRAOCULAR LENS PLACEMENT (IOC);  Surgeon: Adonis Brook, MD;  Location: [facility];  Service: Ophthalmology;   Laterality: Right;   MEDS: not on prednisone or vicodin listed below Outpatient Prescriptions Prior to Visit  Medication Sig Dispense Refill  . aspirin 81 MG tablet Take 81 mg by mouth daily.        Marland Kitchen atorvastatin (LIPITOR) 80 MG tablet Take 0.5 tablets (40 mg total) by mouth daily at 6 PM.  30 tablet  6  . beta carotene w/minerals (OCUVITE) tablet Take 1 tablet by mouth daily.      . cetirizine (ZYRTEC) 10 MG tablet Take 10 mg by mouth daily.      . citalopram (CELEXA) 20 MG tablet Take 1 tablet (20 mg total) by mouth daily.  30 tablet  1  . clonazePAM (KLONOPIN) 1 MG tablet Take 1 tablet (1 mg total) by mouth 2 (two) times daily as needed for anxiety.  60 tablet  1  . clopidogrel (PLAVIX) 75 MG tablet Take 1 tablet (75 mg total) by mouth daily with breakfast.  30 tablet  11  . clotrimazole-betamethasone (LOTRISONE) cream APPLY   TOPICALLY TO AFFECTED AREA TWICE DAILY  45 g  3  . diltiazem (CARDIZEM CD) 120 MG 24 hr capsule TAKE ONE CAPSULE BY MOUTH ONCE DAILY  30 capsule  5  . glucose blood (FREESTYLE LITE) test strip 1 each by Other route 3 (three) times daily. Use  as instructed  100 each  11  . hydrOXYzine (ATARAX/VISTARIL) 25 MG tablet Take 1 tablet (25 mg total) by mouth every 4 (four) hours as needed for anxiety or itching.  60 tablet  5  . isosorbide mononitrate (IMDUR) 30 MG 24 hr tablet Take 1 tablet (30 mg total) by mouth daily.  30 tablet  11  . Lancets (FREESTYLE) lancets 1 each by Other route 3 (three) times daily. Use as instructed  100 each  11  . lisinopril (PRINIVIL,ZESTRIL) 40 MG tablet TAKE ONE TABLET BY MOUTH ONCE DAILY  90 tablet  1  . metFORMIN (GLUCOPHAGE) 1000 MG tablet Take 1 tablet (1,000 mg total) by mouth 2 (two) times daily with a meal.  60 tablet  5  . NOVOLIN N RELION 100 UNIT/ML injection INJECT 70 UNITS SUBCUTANEOUSLY TWICE DAILY  10 mL  5  . NOVOLIN R RELION 100 UNIT/ML injection INJECT 32 UNITS SUBCUTANEOUSLY THREE TIMES A DAY WITH MEALS  50 mL  5  .  torsemide (DEMADEX) 20 MG tablet TAKE ONE TABLET BY MOUTH ONCE DAILY  90 tablet  0  . HYDROcodone-acetaminophen (VICODIN) 5-500 MG per tablet Take 1 tablet by mouth daily as needed for pain.      . predniSONE (DELTASONE) 10 MG tablet Take 4 tabs a day for 4 days, then 3 tabs a day for 4 days, then 2 a day for 4 days, then 1 a day for 4 days then stop  60 tablet  0   No facility-administered medications prior to visit.    No Known Allergies  ROS As per HPI  PE: Blood pressure 127/71, pulse 104, temperature 98 F (36.7 C), temperature source Oral, resp. rate 20, height 5\' 9"  (1.753 m), weight 330 lb (149.687 kg), SpO2 92.00%. Gen: Alert, well appearing.  Patient is oriented to person, place, time, and situation. AFFECT: nervously itching the entire encounter.  Scratching arms mostly. His skin has no rash but I can see lots of scratch marks from his rubbing/scratching. No sores, hives, or vesicles.  LABS:  None today  IMPRESSION AND PLAN:  Pruritic condition Related to multiple environmental allergens possibly, but also has a HUGE anxiety component. Will continue all his current antihistamines, citalopram, clonazepam. Add neurontin 300 mg tid --start at 300mg  qhs and titrate to tid dosing over 2 weeks. Therapeutic expectations and side effect profile of medication discussed today.  Patient's questions answered.   DIABETES MELLITUS, TYPE II Good control.   The current medical regimen is effective;  continue present plan and medications. Monitoring/screening UTD at this time.  An After Visit Summary was printed and given to the patient.  FOLLOW UP: Return in about 1 month (around 06/18/2014) for f/u itching and anxiety.

## 2014-05-18 NOTE — Progress Notes (Signed)
Pre visit review using our clinic review tool, if applicable. No additional management support is needed unless otherwise documented below in the visit note. 

## 2014-05-18 NOTE — Assessment & Plan Note (Signed)
Good control.   The current medical regimen is effective;  continue present plan and medications. Monitoring/screening UTD at this time.

## 2014-05-18 NOTE — Assessment & Plan Note (Signed)
Related to multiple environmental allergens possibly, but also has a HUGE anxiety component. Will continue all his current antihistamines, citalopram, clonazepam. Add neurontin 300 mg tid --start at 300mg  qhs and titrate to tid dosing over 2 weeks. Therapeutic expectations and side effect profile of medication discussed today.  Patient's questions answered.

## 2014-05-18 NOTE — Patient Instructions (Signed)
Take gabapentin 300 mg capsule every night for 5d, then take one in the morning and 1 at night for 5d, then take 1 cap three times per day and stay on this dosing until I see you again in 1 month.

## 2014-05-29 DIAGNOSIS — L299 Pruritus, unspecified: Secondary | ICD-10-CM

## 2014-05-29 DIAGNOSIS — L309 Dermatitis, unspecified: Secondary | ICD-10-CM

## 2014-05-29 HISTORY — DX: Dermatitis, unspecified: L30.9

## 2014-05-29 HISTORY — DX: Pruritus, unspecified: L29.9

## 2014-06-07 ENCOUNTER — Other Ambulatory Visit (INDEPENDENT_AMBULATORY_CARE_PROVIDER_SITE_OTHER): Payer: Medicare Other

## 2014-06-07 DIAGNOSIS — E785 Hyperlipidemia, unspecified: Secondary | ICD-10-CM

## 2014-06-07 LAB — LIPID PANEL
CHOL/HDL RATIO: 4
Cholesterol: 152 mg/dL (ref 0–200)
HDL: 34.8 mg/dL — AB (ref >39.00)
NonHDL: 117.2
Triglycerides: 251 mg/dL — ABNORMAL HIGH (ref 0.0–149.0)
VLDL: 50.2 mg/dL — ABNORMAL HIGH (ref 0.0–40.0)

## 2014-06-07 LAB — HEPATIC FUNCTION PANEL
ALT: 17 U/L (ref 0–53)
AST: 21 U/L (ref 0–37)
Albumin: 3.8 g/dL (ref 3.5–5.2)
Alkaline Phosphatase: 44 U/L (ref 39–117)
Bilirubin, Direct: 0.1 mg/dL (ref 0.0–0.3)
TOTAL PROTEIN: 7.4 g/dL (ref 6.0–8.3)
Total Bilirubin: 1.1 mg/dL (ref 0.2–1.2)

## 2014-06-07 LAB — LDL CHOLESTEROL, DIRECT: LDL DIRECT: 87.7 mg/dL

## 2014-06-08 ENCOUNTER — Other Ambulatory Visit: Payer: Self-pay | Admitting: General Surgery

## 2014-06-08 DIAGNOSIS — E785 Hyperlipidemia, unspecified: Secondary | ICD-10-CM

## 2014-06-08 MED ORDER — CVS FISH OIL 1000 MG PO CAPS: 1.0000 | ORAL_CAPSULE | Freq: Two times a day (BID) | ORAL | Status: DC

## 2014-06-10 ENCOUNTER — Other Ambulatory Visit: Payer: Self-pay | Admitting: *Deleted

## 2014-06-10 MED ORDER — CLONAZEPAM 1 MG PO TABS: 1.0000 mg | ORAL_TABLET | Freq: Two times a day (BID) | ORAL | Status: DC | PRN

## 2014-06-10 NOTE — Telephone Encounter (Signed)
Last OV 05/18/14. Last refill 04/20/14

## 2014-06-10 NOTE — Telephone Encounter (Signed)
Refill request for clonazepam Last filled by MD on- 04/20/2014 #60 x1 Last Appt: 05/18/14 Next Appt: 06/14/2014 Please advise refill?

## 2014-06-10 NOTE — Telephone Encounter (Signed)
Clonaz rx printed. 

## 2014-06-14 ENCOUNTER — Ambulatory Visit (INDEPENDENT_AMBULATORY_CARE_PROVIDER_SITE_OTHER): Payer: Medicare Other | Admitting: Family Medicine

## 2014-06-14 ENCOUNTER — Encounter: Payer: Self-pay | Admitting: Family Medicine

## 2014-06-14 ENCOUNTER — Ambulatory Visit (HOSPITAL_BASED_OUTPATIENT_CLINIC_OR_DEPARTMENT_OTHER)
Admission: RE | Admit: 2014-06-14 | Discharge: 2014-06-14 | Disposition: A | Discharge status: Home / Self Care | Payer: Medicare Other | Source: Ambulatory Visit | Attending: Family Medicine | Admitting: Family Medicine

## 2014-06-14 VITALS — BP 115/63 | HR 90 | Temp 97.6°F | Ht 69.0 in | Wt 324.0 lb

## 2014-06-14 DIAGNOSIS — M19079 Primary osteoarthritis, unspecified ankle and foot: Secondary | ICD-10-CM | POA: Diagnosis not present

## 2014-06-14 DIAGNOSIS — E119 Type 2 diabetes mellitus without complications: Secondary | ICD-10-CM

## 2014-06-14 DIAGNOSIS — M25579 Pain in unspecified ankle and joints of unspecified foot: Secondary | ICD-10-CM

## 2014-06-14 DIAGNOSIS — M25571 Pain in right ankle and joints of right foot: Secondary | ICD-10-CM

## 2014-06-14 DIAGNOSIS — M25473 Effusion, unspecified ankle: Secondary | ICD-10-CM | POA: Diagnosis present

## 2014-06-14 DIAGNOSIS — L299 Pruritus, unspecified: Secondary | ICD-10-CM

## 2014-06-14 LAB — URIC ACID: Uric Acid, Serum: 8.6 mg/dL — ABNORMAL HIGH (ref 4.0–7.8)

## 2014-06-14 MED ORDER — GABAPENTIN 300 MG PO CAPS: ORAL_CAPSULE | ORAL | Status: DC

## 2014-06-14 MED ORDER — CLONAZEPAM 1 MG PO TABS: 1.0000 mg | ORAL_TABLET | Freq: Two times a day (BID) | ORAL | Status: DC | PRN

## 2014-06-14 MED ORDER — CITALOPRAM HYDROBROMIDE 20 MG PO TABS: 20.0000 mg | ORAL_TABLET | Freq: Every day | ORAL | Status: DC

## 2014-06-14 NOTE — Progress Notes (Signed)
Pre visit review using our clinic review tool, if applicable. No additional management support is needed unless otherwise documented below in the visit note. 

## 2014-06-14 NOTE — Progress Notes (Signed)
OFFICE NOTE  06/14/2014  CC:  Chief Complaint  Patient presents with  . Follow-up   HPI: Patient is a 68 y.o. Caucasian male who is here for 1 mo f/u itching and anxiety.  Last visit I added neurontin --titrate to 300mg  tid. Itching not changed. Onset of right ankle pain and swelling about 4 d/a, worse with wt bearing.  No trauma preceding pain.  It is a bit better now than it was at onset.  No fever/chills/malaise.  Pt denies hx of gouty arthritis.  Glucoses 125-150 fasting and 2H PP. Cardiologist recently put him on fish oil to help with triglycerides, o/w his lipid panel was good.   Pertinent PMH:  Past medical, surgical, social, and family history reviewed and no changes are noted since last office visit.  MEDS:  Outpatient Prescriptions Prior to Visit  Medication Sig Dispense Refill  . aspirin 81 MG tablet Take 81 mg by mouth daily.        Marland Kitchen atorvastatin (LIPITOR) 80 MG tablet Take 0.5 tablets (40 mg total) by mouth daily at 6 PM.  30 tablet  6  . beta carotene w/minerals (OCUVITE) tablet Take 1 tablet by mouth daily.      . cetirizine (ZYRTEC) 10 MG tablet Take 10 mg by mouth daily.      . citalopram (CELEXA) 20 MG tablet Take 1 tablet (20 mg total) by mouth daily.  30 tablet  1  . clonazePAM (KLONOPIN) 1 MG tablet Take 1 tablet (1 mg total) by mouth 2 (two) times daily as needed for anxiety.  60 tablet  5  . clopidogrel (PLAVIX) 75 MG tablet Take 1 tablet (75 mg total) by mouth daily with breakfast.  30 tablet  11  . clotrimazole-betamethasone (LOTRISONE) cream APPLY   TOPICALLY TO AFFECTED AREA TWICE DAILY  45 g  3  . diltiazem (CARDIZEM CD) 120 MG 24 hr capsule TAKE ONE CAPSULE BY MOUTH ONCE DAILY  30 capsule  5  . gabapentin (NEURONTIN) 300 MG capsule Take 1 capsule (300 mg total) by mouth 3 (three) times daily.  90 capsule  3  . glucose blood (FREESTYLE LITE) test strip 1 each by Other route 3 (three) times daily. Use as instructed  100 each  11  . hydrOXYzine  (ATARAX/VISTARIL) 25 MG tablet Take 1 tablet (25 mg total) by mouth every 4 (four) hours as needed for anxiety or itching.  60 tablet  5  . isosorbide mononitrate (IMDUR) 30 MG 24 hr tablet Take 1 tablet (30 mg total) by mouth daily.  30 tablet  11  . Lancets (FREESTYLE) lancets 1 each by Other route 3 (three) times daily. Use as instructed  100 each  11  . lisinopril (PRINIVIL,ZESTRIL) 40 MG tablet TAKE ONE TABLET BY MOUTH ONCE DAILY  90 tablet  1  . metFORMIN (GLUCOPHAGE) 1000 MG tablet Take 1 tablet (1,000 mg total) by mouth 2 (two) times daily with a meal.  60 tablet  5  . NOVOLIN N RELION 100 UNIT/ML injection INJECT 70 UNITS SUBCUTANEOUSLY TWICE DAILY  10 mL  5  . NOVOLIN R RELION 100 UNIT/ML injection INJECT 32 UNITS SUBCUTANEOUSLY THREE TIMES A DAY WITH MEALS  50 mL  5  . Omega-3 Fatty Acids ([employer] FISH OIL) 1000 MG CAPS Take 1 tablet by mouth 2 (two) times daily.      Marland Kitchen torsemide (DEMADEX) 20 MG tablet TAKE ONE TABLET BY MOUTH ONCE DAILY  90 tablet  0   No facility-administered medications  prior to visit.    PE: Blood pressure 115/63, pulse 90, temperature 97.6 F (36.4 C), temperature source Temporal, height 5\' 9"  (1.753 m), weight 324 lb (146.965 kg), SpO2 96.00%. Gen: Alert, well appearing.  Patient is oriented to person, place, time, and situation. Both LL's: 2-3 + pitting edema, some scabbed excoriations on right ant tibial surface, otherwise just some hyperpigmented scarring.    Right ankle with slightly decreased ROM, mild increased swelling compared to L, and with notable TTP and mild warmth in lateral and medial aspects of the right ankle.  Feet and toes without tenderness, erythema, or warmth.  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  Will try slow increase of neurontin. Will increase his hs dose of neurontin to 600mg .  Continue hydroxyzine and zyrtec. Continue citalopram 20mg  qd and clonaz 1mg  bid--new rx's for these  today.  2) DM 2, insulin-requiring.  Control is good.  Continue current insulin dosing and metformin. Will recheck HbA1c at next f/u in 9-10 wks.  3) Right ankle painful swelling: suspect gout.  Check right ankle x-ray today and uric acid level today. No meds at this time.  An After Visit Summary was printed and given to the patient.  FOLLOW UP: 9-10 wks.

## 2014-06-25 ENCOUNTER — Encounter: Payer: Self-pay | Admitting: Family Medicine

## 2014-06-25 ENCOUNTER — Ambulatory Visit (INDEPENDENT_AMBULATORY_CARE_PROVIDER_SITE_OTHER): Payer: Medicare Other | Admitting: Family Medicine

## 2014-06-25 ENCOUNTER — Telehealth: Payer: Self-pay | Admitting: Family Medicine

## 2014-06-25 VITALS — BP 138/74 | HR 86 | Temp 98.5°F | Resp 22 | Ht 69.0 in | Wt 328.0 lb

## 2014-06-25 DIAGNOSIS — R21 Rash and other nonspecific skin eruption: Secondary | ICD-10-CM

## 2014-06-25 DIAGNOSIS — L299 Pruritus, unspecified: Secondary | ICD-10-CM

## 2014-06-25 MED ORDER — PREDNISONE 20 MG PO TABS: ORAL_TABLET | ORAL | Status: DC

## 2014-06-25 NOTE — Telephone Encounter (Signed)
Patient Information:  Caller Name: Hedy Camara  Phone: [phone]  Patient: [name], [name]  Gender: Male  DOB: 1946/10/26  Age: 68 Years  PCP: Ricardo Jericho [facility])  Office Follow Up:  Does the office need to follow up with this patient?: No  Instructions For The Office: N/A  RN Note:  Patient calling regarding chronic itching.  Currently on medication, but symptoms are getting worse.  States "I was up all night scratching."  Requesting to be seen today.  Symptoms  Reason For Call & Symptoms: hives  Reviewed Health History In EMR: Yes  Reviewed Medications In EMR: Yes  Reviewed Allergies In EMR: No  Reviewed Surgeries / Procedures: Yes  Date of Onset of Symptoms: 06/25/2014  Guideline(s) Used:  Itching - Widespread  Disposition Per Guideline:   See Today or Tomorrow in Office  Reason For Disposition Reached:   Patient wants to be seen  Advice Given:  Call Back If:  You become worse.  Patient Will Follow Care Advice:  YES  Appointment Scheduled:  06/25/2014 14:00:00 Appointment Scheduled Provider:  Ricardo Jericho [facility])

## 2014-06-25 NOTE — Progress Notes (Signed)
OFFICE NOTE  06/27/2014  CC:  Chief Complaint  Patient presents with  . Pruritis   HPI: Patient is a 68 y.o. Caucasian male who is here for ongoing complaint of itching all over. He now has a rash that started approx 2-3 wks ago.   Has seen allergist and was told to bathe less often and that he was likely having allergy to dust/dust mites. Cetaphil bath + cetaphil lotion recommended about 1 mo ago and pt says if anything he is worse.   Pertinent PMH:  Past medical, surgical, social, and family history reviewed and no changes are noted since last office visit.  MEDS:  Outpatient Prescriptions Prior to Visit  Medication Sig Dispense Refill  . aspirin 81 MG tablet Take 81 mg by mouth daily.        Marland Kitchen atorvastatin (LIPITOR) 80 MG tablet Take 0.5 tablets (40 mg total) by mouth daily at 6 PM.  30 tablet  6  . beta carotene w/minerals (OCUVITE) tablet Take 1 tablet by mouth daily.      . cetirizine (ZYRTEC) 10 MG tablet Take 10 mg by mouth daily.      . citalopram (CELEXA) 20 MG tablet Take 1 tablet (20 mg total) by mouth daily.  30 tablet  6  . clonazePAM (KLONOPIN) 1 MG tablet Take 1 tablet (1 mg total) by mouth 2 (two) times daily as needed for anxiety.  60 tablet  5  . clopidogrel (PLAVIX) 75 MG tablet Take 1 tablet (75 mg total) by mouth daily with breakfast.  30 tablet  11  . clotrimazole-betamethasone (LOTRISONE) cream APPLY   TOPICALLY TO AFFECTED AREA TWICE DAILY  45 g  3  . diltiazem (CARDIZEM CD) 120 MG 24 hr capsule TAKE ONE CAPSULE BY MOUTH ONCE DAILY  30 capsule  5  . gabapentin (NEURONTIN) 300 MG capsule 1 cap qAM, 1 cap mid afternoon, and 2 caps at bedtime  120 capsule  6  . hydrOXYzine (ATARAX/VISTARIL) 25 MG tablet Take 1 tablet (25 mg total) by mouth every 4 (four) hours as needed for anxiety or itching.  60 tablet  5  . isosorbide mononitrate (IMDUR) 30 MG 24 hr tablet Take 1 tablet (30 mg total) by mouth daily.  30 tablet  11  . lisinopril (PRINIVIL,ZESTRIL) 40 MG tablet  TAKE ONE TABLET BY MOUTH ONCE DAILY  90 tablet  1  . metFORMIN (GLUCOPHAGE) 1000 MG tablet Take 1 tablet (1,000 mg total) by mouth 2 (two) times daily with a meal.  60 tablet  5  . NOVOLIN N RELION 100 UNIT/ML injection INJECT 70 UNITS SUBCUTANEOUSLY TWICE DAILY  10 mL  5  . NOVOLIN R RELION 100 UNIT/ML injection INJECT 32 UNITS SUBCUTANEOUSLY THREE TIMES A DAY WITH MEALS  50 mL  5  . Omega-3 Fatty Acids ([employer] FISH OIL) 1000 MG CAPS Take 1 tablet by mouth 2 (two) times daily.      Marland Kitchen torsemide (DEMADEX) 20 MG tablet TAKE ONE TABLET BY MOUTH ONCE DAILY  90 tablet  0  . glucose blood (FREESTYLE LITE) test strip 1 each by Other route 3 (three) times daily. Use as instructed  100 each  11  . Lancets (FREESTYLE) lancets 1 each by Other route 3 (three) times daily. Use as instructed  100 each  11   No facility-administered medications prior to visit.    PE: Blood pressure 138/74, pulse 86, temperature 98.5 F (36.9 C), temperature source Temporal, resp. rate 22, height 5\' 9"  (1.753  m), weight 328 lb (148.78 kg), SpO2 96.00%. Gen: Alert, well appearing.  Patient is oriented to person, place, time, and situation. SKIN: on pt's arms, back, abdomen, and upper legs he has diffuse splotches/patches of hyperkeratotic and slightly flaky eczematous type rash.  No vesicles, no hives, no pustules.  No target lesions.  IMPRESSION AND PLAN:  Rash and nonspecific skin eruption General eczematous eruption after having a couple of months of pruritis only. Punch biopsy done today: r/o atypical pityriasis rosea, atypical psoriasis. Continue current antihistamine regimen. Add prednisone 40 mg qd x 7d, then 20mg  qd x 7d, then 10mg  qd x 8d.  After informed written consent was obtained, using Betadine for cleansing and 1% Lidocaine with epinephrine for anesthetic, with sterile technique a 4 mm punch biopsy was used to obtain a biopsy specimen of the lesion. Hemostasis was obtained by pressure and wound was sutured with  1 4-0 Ethilon suture. Antibiotic dressing is applied, and wound care instructions provided. Be alert for any signs of cutaneous infection. The specimen is labeled and sent to pathology for evaluation. The procedure was well tolerated without complications.    An After Visit Summary was printed and given to the patient.  FOLLOW UP: 5d f/u rash and remove suture

## 2014-06-25 NOTE — Progress Notes (Signed)
Pre visit review using our clinic review tool, if applicable. No additional management support is needed unless otherwise documented below in the visit note. 

## 2014-06-27 ENCOUNTER — Encounter: Payer: Self-pay | Admitting: Family Medicine

## 2014-06-27 DIAGNOSIS — R21 Rash and other nonspecific skin eruption: Secondary | ICD-10-CM | POA: Insufficient documentation

## 2014-06-27 NOTE — Assessment & Plan Note (Addendum)
General eczematous eruption after having a couple of months of pruritis only. Punch biopsy done today: r/o atypical pityriasis rosea, atypical psoriasis. Continue current antihistamine regimen. Add prednisone 40 mg qd x 7d, then 20mg  qd x 7d, then 10mg  qd x 8d.  After informed written consent was obtained, using Betadine for cleansing and 1% Lidocaine with epinephrine for anesthetic, with sterile technique a 4 mm punch biopsy was used to obtain a biopsy specimen of the lesion. Hemostasis was obtained by pressure and wound was sutured with 1 4-0 Ethilon suture. Antibiotic dressing is applied, and wound care instructions provided. Be alert for any signs of cutaneous infection. The specimen is labeled and sent to pathology for evaluation. The procedure was well tolerated without complications.

## 2014-06-28 ENCOUNTER — Other Ambulatory Visit: Payer: Self-pay | Admitting: Internal Medicine

## 2014-06-28 ENCOUNTER — Telehealth: Payer: Self-pay | Admitting: Family Medicine

## 2014-06-28 MED ORDER — INSULIN NPH (HUMAN) (ISOPHANE) 100 UNIT/ML ~~LOC~~ SUSP: SUBCUTANEOUS | Status: DC

## 2014-06-28 NOTE — Telephone Encounter (Signed)
RF'd Relion (Novolin N) 70 U bid as per pt request.

## 2014-06-28 NOTE — Telephone Encounter (Signed)
Patient requesting rf of his "70 unit insulin"  I see two of the same insulins in his chart one states 70 units and one states 32 units.  Please advise refill.

## 2014-06-30 ENCOUNTER — Ambulatory Visit (INDEPENDENT_AMBULATORY_CARE_PROVIDER_SITE_OTHER): Payer: Medicare Other | Admitting: Family Medicine

## 2014-06-30 ENCOUNTER — Encounter: Payer: Self-pay | Admitting: Family Medicine

## 2014-06-30 VITALS — BP 117/70 | HR 83 | Temp 97.5°F | Resp 20 | Ht 69.0 in | Wt 321.0 lb

## 2014-06-30 DIAGNOSIS — Z4802 Encounter for removal of sutures: Secondary | ICD-10-CM

## 2014-06-30 DIAGNOSIS — R21 Rash and other nonspecific skin eruption: Secondary | ICD-10-CM

## 2014-06-30 NOTE — Progress Notes (Signed)
OFFICE NOTE  06/30/2014  CC:  Chief Complaint  Patient presents with  . Suture / Staple Removal     HPI: Patient is a 68 y.o. Caucasian male who is here for 4 d f/u pruritic rash, recent skin (punch) biopsy done here, needs suture removal.  Itching is much improved since getting on prednisone last visit.  Rash is clearing up some. No pain or drainage or bleeding from biopsy site.  Pertinent PMH:  Past medical, surgical, social, and family history reviewed and no changes are noted since last office visit.  MEDS:  Outpatient Prescriptions Prior to Visit  Medication Sig Dispense Refill  . aspirin 81 MG tablet Take 81 mg by mouth daily.        Marland Kitchen atorvastatin (LIPITOR) 80 MG tablet Take 0.5 tablets (40 mg total) by mouth daily at 6 PM.  30 tablet  6  . beta carotene w/minerals (OCUVITE) tablet Take 1 tablet by mouth daily.      . cetirizine (ZYRTEC) 10 MG tablet Take 10 mg by mouth daily.      . citalopram (CELEXA) 20 MG tablet Take 1 tablet (20 mg total) by mouth daily.  30 tablet  6  . clonazePAM (KLONOPIN) 1 MG tablet Take 1 tablet (1 mg total) by mouth 2 (two) times daily as needed for anxiety.  60 tablet  5  . clopidogrel (PLAVIX) 75 MG tablet Take 1 tablet (75 mg total) by mouth daily with breakfast.  30 tablet  11  . clotrimazole-betamethasone (LOTRISONE) cream APPLY   TOPICALLY TO AFFECTED AREA TWICE DAILY  45 g  3  . diltiazem (CARDIZEM CD) 120 MG 24 hr capsule TAKE ONE CAPSULE BY MOUTH ONCE DAILY  30 capsule  5  . gabapentin (NEURONTIN) 300 MG capsule 1 cap qAM, 1 cap mid afternoon, and 2 caps at bedtime  120 capsule  6  . glucose blood (FREESTYLE LITE) test strip 1 each by Other route 3 (three) times daily. Use as instructed  100 each  11  . hydrOXYzine (ATARAX/VISTARIL) 25 MG tablet Take 1 tablet (25 mg total) by mouth every 4 (four) hours as needed for anxiety or itching.  60 tablet  5  . insulin NPH Human (NOVOLIN N RELION) 100 UNIT/ML injection INJECT 70 UNITS SUBCUTANEOUSLY  TWICE DAILY  10 mL  12  . isosorbide mononitrate (IMDUR) 30 MG 24 hr tablet Take 1 tablet (30 mg total) by mouth daily.  30 tablet  11  . Lancets (FREESTYLE) lancets 1 each by Other route 3 (three) times daily. Use as instructed  100 each  11  . lisinopril (PRINIVIL,ZESTRIL) 40 MG tablet TAKE ONE TABLET BY MOUTH ONCE DAILY  90 tablet  1  . metFORMIN (GLUCOPHAGE) 1000 MG tablet Take 1 tablet (1,000 mg total) by mouth 2 (two) times daily with a meal.  60 tablet  5  . NOVOLIN R RELION 100 UNIT/ML injection INJECT 32 UNITS SUBCUTANEOUSLY THREE TIMES A DAY WITH MEALS  50 mL  5  . Omega-3 Fatty Acids ([employer] FISH OIL) 1000 MG CAPS Take 1 tablet by mouth 2 (two) times daily.      . predniSONE (DELTASONE) 20 MG tablet 2 tabs po qd x 7d, then 1 tab po qd x 7d, then 1/2 tab po qd x 8d, then stop  25 tablet  0  . torsemide (DEMADEX) 20 MG tablet TAKE ONE TABLET BY MOUTH ONCE DAILY  90 tablet  0   No facility-administered medications prior to visit.  PE: Blood pressure 117/70, pulse 83, temperature 97.5 F (36.4 C), temperature source Temporal, resp. rate 20, height 5\' 9"  (1.753 m), weight 321 lb (145.605 kg), SpO2 96.00%. Gen: Alert, well appearing.  Patient is oriented to person, place, time, and situation. Right upper back bx site without erythema, drainage, or tenderness.  Suture removed today without problem. No subQ tissue visible, no blood.  Small divot in area of bx is remaining. SKIN: diffuse eczematous patches on trunk and arms are lighter and less numerous than when I saw him last week.  IMPRESSION AND PLAN:  Pruritic dermatitis, unknown etiology. Punch biopsy path report pending. Suture removed today (#1) w/out problem. Continue steroid taper and all other chronic meds.  FOLLOW UP: keep routine f/u appt already scheduled for 07/2014.

## 2014-06-30 NOTE — Progress Notes (Signed)
Pre visit review using our clinic review tool, if applicable. No additional management support is needed unless otherwise documented below in the visit note. 

## 2014-07-02 ENCOUNTER — Telehealth: Payer: Self-pay | Admitting: Family Medicine

## 2014-07-02 MED ORDER — INSULIN NPH (HUMAN) (ISOPHANE) 100 UNIT/ML ~~LOC~~ SUSP: SUBCUTANEOUS | Status: DC

## 2014-07-02 NOTE — Telephone Encounter (Signed)
Pt LMOM stating that he paid $6.60 for one bottle of his insulin and he usually gets 5 bottles of insulin for the same price.  He says that we need to fix this.  Spoke to pharmacy and sent in Arab as 5 Vials # 12 refills as previously prescribed.  Patient aware.

## 2014-07-02 NOTE — Telephone Encounter (Signed)
Noted  

## 2014-07-10 ENCOUNTER — Other Ambulatory Visit: Payer: Self-pay | Admitting: Internal Medicine

## 2014-08-20 ENCOUNTER — Other Ambulatory Visit: Payer: Self-pay | Admitting: Internal Medicine

## 2014-08-20 ENCOUNTER — Telehealth: Payer: Self-pay | Admitting: *Deleted

## 2014-08-20 NOTE — Telephone Encounter (Signed)
Received rx request [facility] for One Touch Ultra Test Strips (test 3 times a day as directed) and One Touch Delica 33K Lancets (Use to test 3 times a day). Please advise refill?

## 2014-08-22 NOTE — Telephone Encounter (Signed)
OK to RF as previously prescribed with 11 additional RF's.-thx

## 2014-08-23 MED ORDER — ONETOUCH DELICA LANCETS 33G MISC: 1.0000 | Freq: Three times a day (TID) | Status: DC

## 2014-08-23 NOTE — Telephone Encounter (Signed)
Rx sent 

## 2014-08-26 ENCOUNTER — Encounter: Payer: Self-pay | Admitting: Family Medicine

## 2014-08-26 ENCOUNTER — Ambulatory Visit (INDEPENDENT_AMBULATORY_CARE_PROVIDER_SITE_OTHER): Payer: Medicare Other | Admitting: Family Medicine

## 2014-08-26 ENCOUNTER — Telehealth: Payer: Self-pay | Admitting: Family Medicine

## 2014-08-26 VITALS — BP 137/72 | HR 94 | Temp 97.0°F | Resp 22 | Ht 69.0 in | Wt 325.0 lb

## 2014-08-26 DIAGNOSIS — J449 Chronic obstructive pulmonary disease, unspecified: Secondary | ICD-10-CM | POA: Insufficient documentation

## 2014-08-26 DIAGNOSIS — J438 Other emphysema: Secondary | ICD-10-CM

## 2014-08-26 DIAGNOSIS — R270 Ataxia, unspecified: Secondary | ICD-10-CM

## 2014-08-26 DIAGNOSIS — R296 Repeated falls: Secondary | ICD-10-CM | POA: Insufficient documentation

## 2014-08-26 DIAGNOSIS — Z7409 Other reduced mobility: Secondary | ICD-10-CM

## 2014-08-26 DIAGNOSIS — Z23 Encounter for immunization: Secondary | ICD-10-CM

## 2014-08-26 DIAGNOSIS — I5032 Chronic diastolic (congestive) heart failure: Secondary | ICD-10-CM

## 2014-08-26 NOTE — Telephone Encounter (Signed)
Patient notified. Patient stated that he will discuss this with you tomorrow at his appt.

## 2014-08-26 NOTE — Telephone Encounter (Signed)
Patient Information:  Caller Name: [name]  Phone: [phone]  Patient: [name], [name]  Gender: Male  DOB: 30-Sep-1946  Age: 68 Years  PCP: Ricardo Jericho [facility])  Office Follow Up:  Does the office need to follow up with this patient?: No  Instructions For The Office: N/A   Symptoms  Reason For Call & Symptoms: Pt states she was in the office this morning and Dr Anitra Lauth is going to send forms to help pt get electric chair.  Pt reports he forgot to let Dr Anitra Lauth know he did have back surgery 2001 and can not stand for long periods of time.  Reviewed Health History In EMR: N/A  Reviewed Medications In EMR: N/A  Reviewed Allergies In EMR: N/A  Reviewed Surgeries / Procedures: N/A  Date of Onset of Symptoms: 08/26/2014  Guideline(s) Used:  No Protocol Available - Information Only  Disposition Per Guideline:   Discuss with PCP and Callback by Nurse Today  Reason For Disposition Reached:   Nursing judgment  Advice Given:  N/A  Patient Will Follow Care Advice:  YES

## 2014-08-26 NOTE — Telephone Encounter (Signed)
FYI

## 2014-08-26 NOTE — Progress Notes (Signed)
Pre visit review using our clinic review tool, if applicable. No additional management support is needed unless otherwise documented below in the visit note. 

## 2014-08-26 NOTE — Assessment & Plan Note (Signed)
Patient would benefit from a power mobility device. Will complete necessary paperwork/order. All necessary documentation is stated above in HPI and exam.
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
## 2014-08-26 NOTE — Telephone Encounter (Signed)
Noted  

## 2014-08-26 NOTE — Telephone Encounter (Signed)
Noted. Pls call pt back and tell him that after going over his case I want him to get a brain MRI. I will still do the mobility paperwork--no changes with this.  He just has several symptoms that go together to make me want to make sure his brain looks normal.  Let me know what he says.-thx

## 2014-08-27 ENCOUNTER — Ambulatory Visit (INDEPENDENT_AMBULATORY_CARE_PROVIDER_SITE_OTHER): Payer: Medicare Other | Admitting: Family Medicine

## 2014-08-27 ENCOUNTER — Encounter: Payer: Self-pay | Admitting: Family Medicine

## 2014-08-27 VITALS — BP 104/65 | HR 89 | Temp 97.6°F | Resp 22 | Ht 69.0 in | Wt 325.0 lb

## 2014-08-27 DIAGNOSIS — L282 Other prurigo: Secondary | ICD-10-CM

## 2014-08-27 DIAGNOSIS — B86 Scabies: Secondary | ICD-10-CM

## 2014-08-27 DIAGNOSIS — R270 Ataxia, unspecified: Secondary | ICD-10-CM

## 2014-08-27 MED ORDER — IVERMECTIN 3 MG PO TABS: 200.0000 ug/kg | ORAL_TABLET | Freq: Once | ORAL | Status: DC

## 2014-08-27 NOTE — Progress Notes (Signed)
OFFICE NOTE  08/27/2014  CC:  Chief Complaint  Patient presents with  . Pruritis     HPI: Patient is a 68 y.o. Caucasian male who is here for f/u itchy rash.   Rash for last couple months, started after at least a month of itching without rash. Bx not that helpful in determining exact diagnosis.  Has improved in the past with steroids but never went away completely.  Topical steroids have not helped. He now says his grandson was dx'd with scabies and given a rx of permethrin.  Pt says he took some of grandson's permethrin and for 3d he had no more itching and the rash got better but then things went back to same.  He waited 2 wks and tried permethrin again (about 1 wk ago) and it "didn't do a bit of good". He sleeps alone on recliner but grandchildren do jump on him and play so he has some close contact with them and could have picked up scabies this way.   Between treatments with permethrin he sprayed his home down with lysol but he did no special washing of his pillows or clothes.  Pertinent PMH:  Past medical, surgical, social, and family history reviewed and no changes are noted since last office visit.  MEDS:  Outpatient Prescriptions Prior to Visit  Medication Sig Dispense Refill  . aspirin 81 MG tablet Take 81 mg by mouth daily.        Marland Kitchen atorvastatin (LIPITOR) 80 MG tablet Take 0.5 tablets (40 mg total) by mouth daily at 6 PM.  30 tablet  6  . beta carotene w/minerals (OCUVITE) tablet Take 1 tablet by mouth daily.      . cetirizine (ZYRTEC) 10 MG tablet Take 10 mg by mouth daily.      . citalopram (CELEXA) 20 MG tablet Take 1 tablet (20 mg total) by mouth daily.  30 tablet  6  . clonazePAM (KLONOPIN) 1 MG tablet Take 1 tablet (1 mg total) by mouth 2 (two) times daily as needed for anxiety.  60 tablet  5  . clopidogrel (PLAVIX) 75 MG tablet Take 1 tablet (75 mg total) by mouth daily with breakfast.  30 tablet  11  . clotrimazole-betamethasone (LOTRISONE) cream APPLY   TOPICALLY  TO AFFECTED AREA TWICE DAILY  45 g  3  . diltiazem (CARDIZEM CD) 120 MG 24 hr capsule TAKE ONE CAPSULE BY MOUTH ONCE DAILY  30 capsule  5  . gabapentin (NEURONTIN) 300 MG capsule 1 cap qAM, 1 cap mid afternoon, and 2 caps at bedtime  120 capsule  6  . hydrOXYzine (ATARAX/VISTARIL) 25 MG tablet Take 1 tablet (25 mg total) by mouth every 4 (four) hours as needed for anxiety or itching.  60 tablet  5  . insulin NPH Human (NOVOLIN N RELION) 100 UNIT/ML injection INJECT 70 UNITS SUBCUTANEOUSLY TWICE DAILY  5 vial  12  . isosorbide mononitrate (IMDUR) 30 MG 24 hr tablet Take 1 tablet (30 mg total) by mouth daily.  30 tablet  11  . lisinopril (PRINIVIL,ZESTRIL) 40 MG tablet TAKE ONE TABLET BY MOUTH ONCE DAILY  90 tablet  1  . metFORMIN (GLUCOPHAGE) 1000 MG tablet Take 1 tablet (1,000 mg total) by mouth 2 (two) times daily with a meal.  60 tablet  5  . NOVOLIN R RELION 100 UNIT/ML injection INJECT 32 UNITS SUBCUTANEOUSLY THREE TIMES A DAY WITH MEALS  50 mL  5  . Omega-3 Fatty Acids ([employer] FISH OIL) 1000 MG  CAPS Take 1 tablet by mouth 2 (two) times daily.      . ONE TOUCH ULTRA TEST test strip TEST 3 TIMES A DAY AS DIRECTED  100 each  11  . ONETOUCH DELICA LANCETS 63O MISC 1 each by Other route 3 (three) times daily. USE TO TEST 3 TIMES A DAY  100 each  11  . predniSONE (DELTASONE) 20 MG tablet 2 tabs po qd x 7d, then 1 tab po qd x 7d, then 1/2 tab po qd x 8d, then stop  25 tablet  0  . torsemide (DEMADEX) 20 MG tablet TAKE ONE TABLET BY MOUTH ONCE DAILY  90 tablet  4   No facility-administered medications prior to visit.    PE: Blood pressure 104/65, pulse 89, temperature 97.6 F (36.4 C), temperature source Temporal, resp. rate 22, height 5\' 9"  (1.753 m), weight 325 lb (147.419 kg), SpO2 94.00%. Gen: Alert, well appearing.  Patient is oriented to person, place, time, and situation. Diffuse pinkish plaques, papules, and excoriated regions of skin on arms, upper chest, buttocks region, abdomen.  Lower  legs with similar rash but also some vesicular lesions, L>R pretibial region, size 49mm up to 2 cm.  Clear fluid in these. I used a sterile #10 scalpel to unroof one of the left LL vesicles, scraped the base of this lesion and wiped scrapings onto a slide, also used some of the vesicular fluid on my scalpel blade to scrape a dry lesion on right knee---wiped these scrapings on a different slide.  IMPRESSION AND PLAN:  Chronic, intractable dermatitis. Possibly scabies.   Skin scaping exam under microscope today did not reveal any mites, eggs, or feces. Very difficult to determine what to do here, but I will go ahead and do trial of ivermectin at 200 mcg/kg dosing (30mg  dose PO).  Will dose again in 2 wks if not completely resolved. Pt voices lots of angst about financial burden of things here, resistant to specialist referral b/c of fear of more testing or meds he may have to pay for.  Did not get allergy tested b/c of cost.  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 We'll look into the price of the MRI and look into whether his insurance will cover their 80% or not.  An After Visit Summary was printed and given to the patient.  Spent 30 min with pt today, with >50% of this time spent in counseling and care coordination regarding the above problems.  FOLLOW UP: 2 weeks

## 2014-08-27 NOTE — Progress Notes (Signed)
Pre visit review using our clinic review tool, if applicable. No additional management support is needed unless otherwise documented below in the visit note. 

## 2014-09-03 ENCOUNTER — Ambulatory Visit: Payer: Medicare Other | Admitting: Family Medicine

## 2014-09-10 ENCOUNTER — Ambulatory Visit (INDEPENDENT_AMBULATORY_CARE_PROVIDER_SITE_OTHER): Payer: Medicare Other | Admitting: Family Medicine

## 2014-09-10 ENCOUNTER — Ambulatory Visit: Payer: Medicare Other | Admitting: Family Medicine

## 2014-09-10 ENCOUNTER — Encounter: Payer: Self-pay | Admitting: Family Medicine

## 2014-09-10 VITALS — BP 127/66 | HR 89 | Temp 98.2°F | Resp 22 | Ht 69.0 in | Wt 329.0 lb

## 2014-09-10 DIAGNOSIS — B86 Scabies: Secondary | ICD-10-CM

## 2014-09-10 NOTE — Progress Notes (Signed)
Pre visit review using our clinic review tool, if applicable. No additional management support is needed unless otherwise documented below in the visit note. 

## 2014-09-10 NOTE — Progress Notes (Signed)
OFFICE NOTE  09/10/2014  CC:  Chief Complaint  Patient presents with  . Follow-up     HPI: Patient is a 68 y.o. Caucasian male who is here for 2 wk f/u rash, treated him with ivermectin for suspicion of scabies rash. 80-90 % improved.  Still some mild itching for about an hour in early evening on left shoulder and some on legs.  Skin clearing up niceley.  Pertinent PMH:  Past medical, surgical, social, and family history reviewed and no changes are noted since last office visit.  MEDS:  Outpatient Prescriptions Prior to Visit  Medication Sig Dispense Refill  . aspirin 81 MG tablet Take 81 mg by mouth daily.      Marland Kitchen atorvastatin (LIPITOR) 80 MG tablet Take 0.5 tablets (40 mg total) by mouth daily at 6 PM. 30 tablet 6  . beta carotene w/minerals (OCUVITE) tablet Take 1 tablet by mouth daily.    . cetirizine (ZYRTEC) 10 MG tablet Take 10 mg by mouth daily.    . citalopram (CELEXA) 20 MG tablet Take 1 tablet (20 mg total) by mouth daily. 30 tablet 6  . clonazePAM (KLONOPIN) 1 MG tablet Take 1 tablet (1 mg total) by mouth 2 (two) times daily as needed for anxiety. 60 tablet 5  . clopidogrel (PLAVIX) 75 MG tablet Take 1 tablet (75 mg total) by mouth daily with breakfast. 30 tablet 11  . clotrimazole-betamethasone (LOTRISONE) cream APPLY   TOPICALLY TO AFFECTED AREA TWICE DAILY 45 g 3  . diltiazem (CARDIZEM CD) 120 MG 24 hr capsule TAKE ONE CAPSULE BY MOUTH ONCE DAILY 30 capsule 5  . gabapentin (NEURONTIN) 300 MG capsule 1 cap qAM, 1 cap mid afternoon, and 2 caps at bedtime 120 capsule 6  . hydrOXYzine (ATARAX/VISTARIL) 25 MG tablet Take 1 tablet (25 mg total) by mouth every 4 (four) hours as needed for anxiety or itching. 60 tablet 5  . insulin NPH Human (NOVOLIN N RELION) 100 UNIT/ML injection INJECT 70 UNITS SUBCUTANEOUSLY TWICE DAILY 5 vial 12  . isosorbide mononitrate (IMDUR) 30 MG 24 hr tablet Take 1 tablet (30 mg total) by mouth daily. 30 tablet 11  . ivermectin (STROMECTOL) 3 MG  TABS tablet Take 10 tablets (30,000 mcg total) by mouth once. 10 tablet 1  . lisinopril (PRINIVIL,ZESTRIL) 40 MG tablet TAKE ONE TABLET BY MOUTH ONCE DAILY 90 tablet 1  . metFORMIN (GLUCOPHAGE) 1000 MG tablet Take 1 tablet (1,000 mg total) by mouth 2 (two) times daily with a meal. 60 tablet 5  . NOVOLIN R RELION 100 UNIT/ML injection INJECT 32 UNITS SUBCUTANEOUSLY THREE TIMES A DAY WITH MEALS 50 mL 5  . Omega-3 Fatty Acids ([employer] FISH OIL) 1000 MG CAPS Take 1 tablet by mouth 2 (two) times daily.    . ONE TOUCH ULTRA TEST test strip TEST 3 TIMES A DAY AS DIRECTED 100 each 11  . ONETOUCH DELICA LANCETS 40J MISC 1 each by Other route 3 (three) times daily. USE TO TEST 3 TIMES A DAY 100 each 11  . torsemide (DEMADEX) 20 MG tablet TAKE ONE TABLET BY MOUTH ONCE DAILY 90 tablet 4  . predniSONE (DELTASONE) 20 MG tablet 2 tabs po qd x 7d, then 1 tab po qd x 7d, then 1/2 tab po qd x 8d, then stop 25 tablet 0   No facility-administered medications prior to visit.    PE: Blood pressure 127/66, pulse 89, temperature 98.2 F (36.8 C), temperature source Temporal, resp. rate 22, height 5\' 9"  (1.753  m), weight 329 lb (149.233 kg), SpO2 94 %. Gen: Alert, well appearing.  Patient is oriented to person, place, time, and situation. AFFECT: pleasant, lucid thought and speech. SKIN: a few dry patches of pinkish hyperkeratotic skin on trunk and upper arms.  Some more excoriated papular lesions on left anterior shoulder.   IMPRESSION AND PLAN:  Scabies: much improved s/p ivermectin. I want him to repeat treatment x 1 and then I think he'll be 100% cured. Pt expressed understanding of this today.  An After Visit Summary was printed and given to the patient.  FOLLOW UP: 36mo

## 2014-09-10 NOTE — Telephone Encounter (Signed)
Patient was given pricing by [employer]. Patient okay with cost.

## 2014-09-13 ENCOUNTER — Telehealth: Payer: Self-pay

## 2014-09-13 ENCOUNTER — Other Ambulatory Visit: Payer: Self-pay | Admitting: Family Medicine

## 2014-09-13 DIAGNOSIS — I10 Essential (primary) hypertension: Secondary | ICD-10-CM

## 2014-09-13 NOTE — Telephone Encounter (Signed)
I need him to come in for lab visit to get BMET, then I can order his brain MRI with contrast.-thx

## 2014-09-13 NOTE — Telephone Encounter (Signed)
Spoke to pt today about scheduling AWV.   He mentioned that he was suppose to hear back about a brain scan referral.  I did not see this in the referrals section the appt desk or chart review.

## 2014-09-14 NOTE — Telephone Encounter (Signed)
HbA1c with dx of DM 2.

## 2014-09-14 NOTE — Telephone Encounter (Signed)
Pt will come 09/20/14 for BMET, since he scheduled his AWV do you want any other labs drawn?

## 2014-09-16 ENCOUNTER — Ambulatory Visit (INDEPENDENT_AMBULATORY_CARE_PROVIDER_SITE_OTHER): Payer: Medicare Other | Admitting: Cardiology

## 2014-09-16 VITALS — BP 132/76 | HR 81 | Ht 69.0 in | Wt 328.0 lb

## 2014-09-16 DIAGNOSIS — J42 Unspecified chronic bronchitis: Secondary | ICD-10-CM

## 2014-09-16 DIAGNOSIS — R0609 Other forms of dyspnea: Secondary | ICD-10-CM

## 2014-09-16 DIAGNOSIS — I10 Essential (primary) hypertension: Secondary | ICD-10-CM

## 2014-09-16 DIAGNOSIS — I5032 Chronic diastolic (congestive) heart failure: Secondary | ICD-10-CM

## 2014-09-16 DIAGNOSIS — I2510 Atherosclerotic heart disease of native coronary artery without angina pectoris: Secondary | ICD-10-CM

## 2014-09-16 DIAGNOSIS — E785 Hyperlipidemia, unspecified: Secondary | ICD-10-CM

## 2014-09-16 LAB — BASIC METABOLIC PANEL
BUN: 26 mg/dL — ABNORMAL HIGH (ref 6–23)
CHLORIDE: 101 meq/L (ref 96–112)
CO2: 30 mEq/L (ref 19–32)
CREATININE: 1.2 mg/dL (ref 0.4–1.5)
Calcium: 9.3 mg/dL (ref 8.4–10.5)
GFR: 66.3 mL/min (ref >60.00)
Glucose, Bld: 101 mg/dL — ABNORMAL HIGH (ref 70–99)
Potassium: 4.9 mEq/L (ref 3.5–5.1)
SODIUM: 139 meq/L (ref 135–145)

## 2014-09-16 LAB — BRAIN NATRIURETIC PEPTIDE: Pro B Natriuretic peptide (BNP): 5 pg/mL (ref 0.0–100.0)

## 2014-09-16 NOTE — Patient Instructions (Signed)
Your physician recommends that you continue on your current medications as directed. Please refer to the Current Medication list given to you today.  Lab Today: [facility], [facility]  Your physician wants you to follow-up in: 6 months with Dr.Turner You will receive a reminder letter in the mail two months in advance. If you don't receive a letter, please call our office to schedule the follow-up appointment.

## 2014-09-16 NOTE — Progress Notes (Signed)
[address], [place], [place]  [address] Phone: [phone] Fax:  [phone]  Date:  09/16/2014   ID:  [name], DOB 31-May-1946, MRN [id number]  PCP:  Tammi Sou, MD  Cardiologist:  Fransico Him, MD    History of Present Illness: [name] is a 68 y.o. male with a history of obesity hypoventilation syndrome on home O2,  HTN, dyslipidemia, morbid obesity, chronic LE edema which is controlled with TED hose stockings and diuretic. He has chronic diastolic CHF controlled on diuretics.   He has ASCAD with severely diseased and chronically occluded LAD and diagonal with right to left collaterals, mild disease in the left circ and moderate disease in the mid RCA on medical management with Imdur and Plavix.  He presents back today for followup. He is doing well. He has chronic SOB related to his underlying obesity hypoventilation and is on home O2 and he thinks his DOE is stable.   He denies any chest pain, dizziness, palpitations or syncope.  It was felt in the past that he may have OSA but has refused sleep study.     Wt Readings from Last 3 Encounters:  09/16/14 328 lb (148.78 kg)  09/10/14 329 lb (149.233 kg)  08/27/14 325 lb (147.419 kg)     Past Medical History  Diagnosis Date  . DIABETES MELLITUS, TYPE II 06/24/2007  . HYPERLIPIDEMIA 06/24/2007  . HYPERTENSION 05/01/2007  . Hypoxemia 01/27/2010  . OBESITY 09/02/2008  . OSTEOARTHRITIS 05/01/2007  . UNSPEC COMBINED SYSTOLIC&DIASTOLIC HEART FAILURE 02/14/6221    pt denies  . COPD (chronic obstructive pulmonary disease)   . Asthma     as a child  . Cataract   . OSA (obstructive sleep apnea)     not tested  . History of kidney stones   . Coronary artery disease   . Pruritic condition 05/2014    Allergist summer 2015, no new testing.  . Dermatitis 05/2014    Current Outpatient Prescriptions  Medication Sig Dispense Refill  . aspirin 81 MG tablet Take 81 mg by mouth daily.      Marland Kitchen atorvastatin (LIPITOR)  80 MG tablet Take 0.5 tablets (40 mg total) by mouth daily at 6 PM. 30 tablet 6  . beta carotene w/minerals (OCUVITE) tablet Take 1 tablet by mouth daily.    . cetirizine (ZYRTEC) 10 MG tablet Take 10 mg by mouth daily.    . citalopram (CELEXA) 20 MG tablet Take 1 tablet (20 mg total) by mouth daily. 30 tablet 6  . clonazePAM (KLONOPIN) 1 MG tablet Take 1 tablet (1 mg total) by mouth 2 (two) times daily as needed for anxiety. 60 tablet 5  . clopidogrel (PLAVIX) 75 MG tablet Take 1 tablet (75 mg total) by mouth daily with breakfast. 30 tablet 11  . clotrimazole-betamethasone (LOTRISONE) cream APPLY   TOPICALLY TO AFFECTED AREA TWICE DAILY 45 g 3  . diltiazem (CARDIZEM CD) 120 MG 24 hr capsule TAKE ONE CAPSULE BY MOUTH ONCE DAILY 30 capsule 5  . gabapentin (NEURONTIN) 300 MG capsule 1 cap qAM, 1 cap mid afternoon, and 2 caps at bedtime 120 capsule 6  . hydrOXYzine (ATARAX/VISTARIL) 25 MG tablet Take 1 tablet (25 mg total) by mouth every 4 (four) hours as needed for anxiety or itching. 60 tablet 5  . insulin NPH Human (NOVOLIN N RELION) 100 UNIT/ML injection INJECT 70 UNITS SUBCUTANEOUSLY TWICE DAILY 5 vial 12  . isosorbide mononitrate (IMDUR) 30 MG 24 hr tablet  Take 1 tablet (30 mg total) by mouth daily. 30 tablet 11  . ivermectin (STROMECTOL) 3 MG TABS tablet Take 10 tablets (30,000 mcg total) by mouth once. 10 tablet 1  . lisinopril (PRINIVIL,ZESTRIL) 40 MG tablet TAKE ONE TABLET BY MOUTH ONCE DAILY 90 tablet 1  . metFORMIN (GLUCOPHAGE) 1000 MG tablet Take 1 tablet (1,000 mg total) by mouth 2 (two) times daily with a meal. 60 tablet 5  . NOVOLIN R RELION 100 UNIT/ML injection INJECT 32 UNITS SUBCUTANEOUSLY THREE TIMES A DAY WITH MEALS 50 mL 5  . Omega-3 Fatty Acids ([employer] FISH OIL) 1000 MG CAPS Take 1 tablet by mouth 2 (two) times daily.    . ONE TOUCH ULTRA TEST test strip TEST 3 TIMES A DAY AS DIRECTED 100 each 11  . ONETOUCH DELICA LANCETS 25K MISC 1 each by Other route 3 (three) times daily. USE  TO TEST 3 TIMES A DAY 100 each 11  . torsemide (DEMADEX) 20 MG tablet TAKE ONE TABLET BY MOUTH ONCE DAILY 90 tablet 4   No current facility-administered medications for this visit.    Allergies:   No Known Allergies  Social History:  The patient  reports that he quit smoking about 30 years ago. His smoking use included Cigarettes. He has a 45 pack-year smoking history. His smokeless tobacco use includes Chew. He reports that he does not drink alcohol or use illicit drugs.   Family History:  The patient's family history includes Alcohol abuse in his father; Aneurysm in his mother. There is no history of Colon cancer.   ROS:  Please see the history of present illness.      All other systems reviewed and negative.   PHYSICAL EXAM: VS:  BP 132/76 mmHg  Pulse 81  Ht 5\' 9"  (1.753 m)  Wt 328 lb (148.78 kg)  BMI 48.42 kg/m2 Well nourished, well developed, in no acute distress HEENT: normal Neck: no JVD Cardiac:  normal S1, S2; RRR; no murmur Lungs:  clear to auscultation bilaterally, no wheezing, rhonchi or rales Abd: soft, nontender, no hepatomegaly Ext: no edema Skin: warm and dry Neuro:  CNs 2-12 intact, no focal abnormalities noted  EKG:  NSR with RBBB     ASSESSMENT AND PLAN:  1. ASCAD with chronically occluded LAD with no angina on medical therapy. - continue ASA/Imdur/Plavix 2. Chronic diastolic CHF appears compensated on exam - continue diltiazem/Torsemide/ACE I - check BMET 3.   HTN well controlled - continue Diltiazem and ACE I 3. Obesity with probable OSA but he has refused sleep study 4. Obesity hypoventilation syndrome on Home O2 5. Dyslipidemia - continue atorvastatin 6.  Chronic DOE secondary to obesity hypoventilation syndrome - I will recheck a BNP to make sure there is no component of CHF going on.  Followup with me in 6 months  Signed, Fransico Him, MD [facility] 09/16/2014 9:52 AM

## 2014-09-20 ENCOUNTER — Other Ambulatory Visit: Payer: Self-pay | Admitting: Family Medicine

## 2014-09-20 ENCOUNTER — Other Ambulatory Visit: Payer: Medicare Other

## 2014-09-20 ENCOUNTER — Ambulatory Visit (INDEPENDENT_AMBULATORY_CARE_PROVIDER_SITE_OTHER): Payer: Medicare Other | Admitting: Family Medicine

## 2014-09-20 ENCOUNTER — Encounter: Payer: Self-pay | Admitting: Family Medicine

## 2014-09-20 ENCOUNTER — Ambulatory Visit (HOSPITAL_BASED_OUTPATIENT_CLINIC_OR_DEPARTMENT_OTHER)
Admission: RE | Admit: 2014-09-20 | Discharge: 2014-09-20 | Disposition: A | Discharge status: Home / Self Care | Payer: Medicare Other | Source: Ambulatory Visit | Attending: Family Medicine | Admitting: Family Medicine

## 2014-09-20 VITALS — BP 115/65 | HR 106 | Temp 98.2°F | Resp 22

## 2014-09-20 DIAGNOSIS — I10 Essential (primary) hypertension: Secondary | ICD-10-CM

## 2014-09-20 DIAGNOSIS — S20211A Contusion of right front wall of thorax, initial encounter: Secondary | ICD-10-CM

## 2014-09-20 DIAGNOSIS — R0781 Pleurodynia: Secondary | ICD-10-CM | POA: Insufficient documentation

## 2014-09-20 DIAGNOSIS — W19XXXA Unspecified fall, initial encounter: Secondary | ICD-10-CM | POA: Insufficient documentation

## 2014-09-20 DIAGNOSIS — E119 Type 2 diabetes mellitus without complications: Secondary | ICD-10-CM

## 2014-09-20 DIAGNOSIS — M25561 Pain in right knee: Secondary | ICD-10-CM

## 2014-09-20 LAB — BASIC METABOLIC PANEL
BUN: 42 mg/dL — ABNORMAL HIGH (ref 6–23)
CALCIUM: 8.2 mg/dL — AB (ref 8.4–10.5)
CHLORIDE: 96 meq/L (ref 96–112)
CO2: 26 mEq/L (ref 19–32)
Creatinine, Ser: 2 mg/dL — ABNORMAL HIGH (ref 0.4–1.5)
GFR: 34.76 mL/min — ABNORMAL LOW (ref >60.00)
Glucose, Bld: 138 mg/dL — ABNORMAL HIGH (ref 70–99)
Potassium: 5.2 mEq/L — ABNORMAL HIGH (ref 3.5–5.1)
SODIUM: 132 meq/L — AB (ref 135–145)

## 2014-09-20 LAB — HEMOGLOBIN A1C: HEMOGLOBIN A1C: 6.5 % (ref 4.6–6.5)

## 2014-09-20 MED ORDER — CEFUROXIME AXETIL 500 MG PO TABS: 500.0000 mg | ORAL_TABLET | Freq: Two times a day (BID) | ORAL | Status: DC

## 2014-09-20 NOTE — Progress Notes (Signed)
Pre visit review using our clinic review tool, if applicable. No additional management support is needed unless otherwise documented below in the visit note. 

## 2014-09-20 NOTE — Progress Notes (Signed)
OFFICE NOTE  09/20/2014  CC:  Chief Complaint  Patient presents with  . Fall    Sunday around 4am.   . Flank Pain    Right sided  . Facial Pain  . Knee Pain    Right sided prior to the fall   HPI: Patient is a 68 y.o. Caucasian male who is here for pain s/p a fall a little over 24 h ago. He got up to go to the bathroom, feet got caught on something, face was first thing to hit flood.  After a few minutes he was able to get up off the floor.  Said immediately after this nothing much hurt, but later he started to hurt in right upper abdomen and right knee.  ROutine DM 2: compliant with insulins and metformin, glucoses at home fine, no hypoglycemia.  Diet fair, no exercise due to cv/pulm/musculo comorbidities.  Rare home bp check wnl.  Compliant with bp meds.   ROS: no HAs, no vision complaints, no dizziness, no focal weakness,   Pertinent PMH:  Past surgical, social, and family history reviewed and no changes noted since last office visit.  MEDS:  Outpatient Prescriptions Prior to Visit  Medication Sig Dispense Refill  . aspirin 81 MG tablet Take 81 mg by mouth daily.      Marland Kitchen atorvastatin (LIPITOR) 80 MG tablet Take 0.5 tablets (40 mg total) by mouth daily at 6 PM. 30 tablet 6  . beta carotene w/minerals (OCUVITE) tablet Take 1 tablet by mouth daily.    . cetirizine (ZYRTEC) 10 MG tablet Take 10 mg by mouth daily.    . citalopram (CELEXA) 20 MG tablet Take 1 tablet (20 mg total) by mouth daily. 30 tablet 6  . clonazePAM (KLONOPIN) 1 MG tablet Take 1 tablet (1 mg total) by mouth 2 (two) times daily as needed for anxiety. 60 tablet 5  . clopidogrel (PLAVIX) 75 MG tablet Take 1 tablet (75 mg total) by mouth daily with breakfast. 30 tablet 11  . clotrimazole-betamethasone (LOTRISONE) cream APPLY   TOPICALLY TO AFFECTED AREA TWICE DAILY 45 g 3  . diltiazem (CARDIZEM CD) 120 MG 24 hr capsule TAKE ONE CAPSULE BY MOUTH ONCE DAILY 30 capsule 5  . gabapentin (NEURONTIN) 300 MG capsule 1  cap qAM, 1 cap mid afternoon, and 2 caps at bedtime 120 capsule 6  . hydrOXYzine (ATARAX/VISTARIL) 25 MG tablet Take 1 tablet (25 mg total) by mouth every 4 (four) hours as needed for anxiety or itching. 60 tablet 5  . insulin NPH Human (NOVOLIN N RELION) 100 UNIT/ML injection INJECT 70 UNITS SUBCUTANEOUSLY TWICE DAILY 5 vial 12  . isosorbide mononitrate (IMDUR) 30 MG 24 hr tablet Take 1 tablet (30 mg total) by mouth daily. 30 tablet 11  . ivermectin (STROMECTOL) 3 MG TABS tablet Take 10 tablets (30,000 mcg total) by mouth once. 10 tablet 1  . lisinopril (PRINIVIL,ZESTRIL) 40 MG tablet TAKE ONE TABLET BY MOUTH ONCE DAILY 90 tablet 1  . metFORMIN (GLUCOPHAGE) 1000 MG tablet Take 1 tablet (1,000 mg total) by mouth 2 (two) times daily with a meal. 60 tablet 5  . NOVOLIN R RELION 100 UNIT/ML injection INJECT 32 UNITS SUBCUTANEOUSLY THREE TIMES A DAY WITH MEALS 50 mL 5  . Omega-3 Fatty Acids ([employer] FISH OIL) 1000 MG CAPS Take 1 tablet by mouth 2 (two) times daily.    . ONE TOUCH ULTRA TEST test strip TEST 3 TIMES A DAY AS DIRECTED 100 each 11  . [facility] DELICA  LANCETS 33G MISC 1 each by Other route 3 (three) times daily. USE TO TEST 3 TIMES A DAY 100 each 11  . torsemide (DEMADEX) 20 MG tablet TAKE ONE TABLET BY MOUTH ONCE DAILY 90 tablet 4   No facility-administered medications prior to visit.    PE: Blood pressure 115/65, pulse 106, temperature 98.2 F (36.8 C), temperature source Temporal, resp. rate 22, SpO2 91 %. Gen: Alert, well appearing.  Patient is oriented to person, place, time, and situation. Face: some scattered superficial abrasions present on nose and forehead in midline. EOMI, PERRLA.  CN 2-12 intact grossly bilat.  No oral lesions. Neck ROM fully intact without pain.  No neck TTP. No chest wall bruising.  LUNGS: CTA bilat, distant BS diffusely as per his usual, nonlabored. CV: distant S1 and S2.  No audible m/r.  Mod TTP in right lower couple of ribs from ant axillary line to mid  clav line.  No abd tenderness.  Abd soft. Right knee: no ecchymoses. Prox tibia region mildly TTP.  ROM of knee intact.  IMPRESSION AND PLAN:  1) Right chest wall contusion s/p fall.   Rib film to r/o fx, eval lungs.  2) Right anterior knee contusion, mild.  Reassured.  No imaging indicated.  3) DM 2, historically fairly well controlled. Check hba1c today +Cr.  4) HTN; The current medical regimen is effective;  continue present plan and medications. BMET today.  5) Hx of ataxia/impaired mobility.  I want to obtain MRI with contrast to r/o intracranial lesion--cr needed to see if GFR adequate for contrast.  An After Visit Summary was printed and given to the patient.  FOLLOW UP: keep 12/10/14 f/u appt

## 2014-09-23 ENCOUNTER — Emergency Department (HOSPITAL_COMMUNITY)
Admission: EM | Admit: 2014-09-23 | Discharge: 2014-09-23 | Disposition: A | Discharge status: Home / Self Care | Payer: Medicare Other | Attending: Emergency Medicine | Admitting: Emergency Medicine

## 2014-09-23 ENCOUNTER — Encounter (HOSPITAL_COMMUNITY): Payer: Self-pay | Admitting: Emergency Medicine

## 2014-09-23 ENCOUNTER — Emergency Department (HOSPITAL_COMMUNITY): Payer: Medicare Other

## 2014-09-23 DIAGNOSIS — Y998 Other external cause status: Secondary | ICD-10-CM | POA: Diagnosis not present

## 2014-09-23 DIAGNOSIS — Z872 Personal history of diseases of the skin and subcutaneous tissue: Secondary | ICD-10-CM | POA: Insufficient documentation

## 2014-09-23 DIAGNOSIS — I10 Essential (primary) hypertension: Secondary | ICD-10-CM | POA: Diagnosis not present

## 2014-09-23 DIAGNOSIS — E785 Hyperlipidemia, unspecified: Secondary | ICD-10-CM | POA: Diagnosis not present

## 2014-09-23 DIAGNOSIS — I5040 Unspecified combined systolic (congestive) and diastolic (congestive) heart failure: Secondary | ICD-10-CM | POA: Diagnosis not present

## 2014-09-23 DIAGNOSIS — Z794 Long term (current) use of insulin: Secondary | ICD-10-CM | POA: Insufficient documentation

## 2014-09-23 DIAGNOSIS — I2510 Atherosclerotic heart disease of native coronary artery without angina pectoris: Secondary | ICD-10-CM | POA: Diagnosis not present

## 2014-09-23 DIAGNOSIS — E119 Type 2 diabetes mellitus without complications: Secondary | ICD-10-CM | POA: Insufficient documentation

## 2014-09-23 DIAGNOSIS — R609 Edema, unspecified: Secondary | ICD-10-CM

## 2014-09-23 DIAGNOSIS — Z87891 Personal history of nicotine dependence: Secondary | ICD-10-CM | POA: Insufficient documentation

## 2014-09-23 DIAGNOSIS — M7989 Other specified soft tissue disorders: Secondary | ICD-10-CM

## 2014-09-23 DIAGNOSIS — E669 Obesity, unspecified: Secondary | ICD-10-CM | POA: Diagnosis not present

## 2014-09-23 DIAGNOSIS — Z79899 Other long term (current) drug therapy: Secondary | ICD-10-CM | POA: Diagnosis not present

## 2014-09-23 DIAGNOSIS — Y9289 Other specified places as the place of occurrence of the external cause: Secondary | ICD-10-CM | POA: Insufficient documentation

## 2014-09-23 DIAGNOSIS — Z87442 Personal history of urinary calculi: Secondary | ICD-10-CM | POA: Insufficient documentation

## 2014-09-23 DIAGNOSIS — Y9389 Activity, other specified: Secondary | ICD-10-CM | POA: Insufficient documentation

## 2014-09-23 DIAGNOSIS — R2241 Localized swelling, mass and lump, right lower limb: Secondary | ICD-10-CM | POA: Diagnosis not present

## 2014-09-23 DIAGNOSIS — W01198A Fall on same level from slipping, tripping and stumbling with subsequent striking against other object, initial encounter: Secondary | ICD-10-CM | POA: Insufficient documentation

## 2014-09-23 DIAGNOSIS — J449 Chronic obstructive pulmonary disease, unspecified: Secondary | ICD-10-CM | POA: Diagnosis not present

## 2014-09-23 DIAGNOSIS — M1711 Unilateral primary osteoarthritis, right knee: Secondary | ICD-10-CM | POA: Diagnosis not present

## 2014-09-23 DIAGNOSIS — Z7982 Long term (current) use of aspirin: Secondary | ICD-10-CM | POA: Diagnosis not present

## 2014-09-23 DIAGNOSIS — S8991XA Unspecified injury of right lower leg, initial encounter: Secondary | ICD-10-CM | POA: Diagnosis present

## 2014-09-23 MED ORDER — HYDROCODONE-ACETAMINOPHEN 5-325 MG PO TABS: 1.0000 | ORAL_TABLET | ORAL | Status: DC | PRN

## 2014-09-23 MED ORDER — HYDROMORPHONE HCL 1 MG/ML IJ SOLN
1.0000 mg | Freq: Once | INTRAMUSCULAR | Status: AC
  Administered 2014-09-23: 1 mg via INTRAMUSCULAR
  Filled 2014-09-23: qty 1

## 2014-09-23 MED ORDER — ONDANSETRON 4 MG PO TBDP
8.0000 mg | ORAL_TABLET | Freq: Once | ORAL | Status: AC
  Administered 2014-09-23: 8 mg via ORAL
  Filled 2014-09-23: qty 2

## 2014-09-23 NOTE — Progress Notes (Signed)
VASCULAR LAB PRELIMINARY  PRELIMINARY  PRELIMINARY  PRELIMINARY  Right lower extremity venous duplex completed.    Preliminary report:  Right:  No obvious evidence of DVT, superficial thrombosis, or Baker's cyst.  [name], RVS 09/23/2014, 1:21 PM

## 2014-09-23 NOTE — ED Notes (Signed)
Patient transported to Ultrasound 

## 2014-09-23 NOTE — ED Notes (Signed)
Knee sleeve applied to right knee

## 2014-09-23 NOTE — ED Notes (Addendum)
Fell on Sunday; mechanical fall; landed face forward with scrapes to forehead. Evaluated by PCP on Monday. Rib xray done; nothing broken. Increasing right knee pain since then; increased swelling in right leg from knee down to ankle. Knees not evaluated on Monday as they were not hurting then. Right ankle/foot is swollen and red.

## 2014-09-23 NOTE — ED Provider Notes (Signed)
CSN: [id number]     Arrival date & time 09/23/14  1022 History   First MD Initiated Contact with Patient 09/23/14 1028     Chief Complaint  Patient presents with  . Knee Pain     (Consider location/radiation/quality/duration/timing/severity/associated sxs/prior Treatment) HPI  [name] is a 68 y.o. male who reports right leg pain and swelling for 5 days, since the fall 6 days ago.  The falls, mechanical, possibly tripping on his oxygen cord.  He saw his PCP, 4 days ago, and was evaluated with x-rays of the right ribs which were negative.  His pain has increased and appear to time and has not improved with taking Norco.  He denies headache, weakness, dizziness, nausea, vomiting, fever or chills.  There's been no exertional chest pain or shortness of breath.  He is taking his other medications without relief.  There are no other known modifying factors.   Past Medical History  Diagnosis Date  . DIABETES MELLITUS, TYPE II 06/24/2007  . HYPERLIPIDEMIA 06/24/2007  . HYPERTENSION 05/01/2007  . Hypoxemia 01/27/2010  . OBESITY 09/02/2008  . OSTEOARTHRITIS 05/01/2007  . UNSPEC COMBINED SYSTOLIC&DIASTOLIC HEART FAILURE 02/20/9562    pt denies  . COPD (chronic obstructive pulmonary disease)   . Asthma     as a child  . Cataract   . OSA (obstructive sleep apnea)     not tested  . History of kidney stones   . Coronary artery disease   . Pruritic condition 05/2014    Allergist summer 2015, no new testing.  . Dermatitis 05/2014   Past Surgical History  Procedure Laterality Date  . Appendectomy    . Wrist surgery      right fx  . Shoulder surgery      right fx  . Back surgery  2001  . Carpal tunnel release Left   . Cataract extraction w/phaco Right 04/29/2013    Procedure: CATARACT EXTRACTION PHACO AND INTRAOCULAR LENS PLACEMENT (IOC);  Surgeon: Adonis Brook, MD;  Location: [facility];  Service: Ophthalmology;  Laterality: Right;   Family History  Problem Relation Age of Onset  . Aneurysm  Mother   . Alcohol abuse Father   . Colon cancer Neg Hx    History  Substance Use Topics  . Smoking status: Former Smoker -- 1.50 packs/day for 30 years    Types: Cigarettes    Quit date: 10/30/1983  . Smokeless tobacco: Current User    Types: Chew  . Alcohol Use: No    Review of Systems  All other systems reviewed and are negative.     Allergies  Review of patient's allergies indicates no known allergies.  Home Medications   Prior to Admission medications   Medication Sig Start Date End Date Taking? Authorizing Provider  aspirin 81 MG tablet Take 81 mg by mouth daily.     Yes Historical Provider, MD  atorvastatin (LIPITOR) 80 MG tablet Take 0.5 tablets (40 mg total) by mouth daily at 6 PM. 04/12/14  Yes Sueanne Margarita, MD  beta carotene w/minerals (OCUVITE) tablet Take 1 tablet by mouth daily.   Yes Historical Provider, MD  cetirizine (ZYRTEC) 10 MG tablet Take 10 mg by mouth daily.   Yes Historical Provider, MD  citalopram (CELEXA) 20 MG tablet Take 1 tablet (20 mg total) by mouth daily. 06/14/14  Yes Tammi Sou, MD  clonazePAM (KLONOPIN) 1 MG tablet Take 1 tablet (1 mg total) by mouth 2 (two) times daily as needed for anxiety.  06/14/14  Yes Tammi Sou, MD  clopidogrel (PLAVIX) 75 MG tablet Take 1 tablet (75 mg total) by mouth daily with breakfast. 10/26/13  Yes Jettie Booze, MD  clotrimazole-betamethasone (LOTRISONE) cream APPLY   TOPICALLY TO AFFECTED AREA TWICE DAILY   Yes Lisabeth Pick, MD  diltiazem (CARDIZEM CD) 120 MG 24 hr capsule TAKE ONE CAPSULE BY MOUTH ONCE DAILY 05/03/14  Yes Lisabeth Pick, MD  gabapentin (NEURONTIN) 300 MG capsule 1 cap qAM, 1 cap mid afternoon, and 2 caps at bedtime 06/14/14  Yes Tammi Sou, MD  hydrOXYzine (ATARAX/VISTARIL) 25 MG tablet Take 1 tablet (25 mg total) by mouth every 4 (four) hours as needed for anxiety or itching. 04/09/14  Yes Laurey Morale, MD  insulin NPH Human (NOVOLIN N RELION) 100 UNIT/ML injection INJECT  70 UNITS SUBCUTANEOUSLY TWICE DAILY 07/02/14  Yes Tammi Sou, MD  isosorbide mononitrate (IMDUR) 30 MG 24 hr tablet Take 1 tablet (30 mg total) by mouth daily. 10/26/13  Yes Jettie Booze, MD  ivermectin (STROMECTOL) 3 MG TABS tablet Take 10 tablets (30,000 mcg total) by mouth once. 08/27/14  Yes Tammi Sou, MD  lisinopril (PRINIVIL,ZESTRIL) 40 MG tablet TAKE ONE TABLET BY MOUTH ONCE DAILY 04/21/14  Yes Tammi Sou, MD  NOVOLIN R RELION 100 UNIT/ML injection INJECT 32 UNITS SUBCUTANEOUSLY THREE TIMES A DAY WITH MEALS   Yes Lisabeth Pick, MD  Omega-3 Fatty Acids ([employer] FISH OIL) 1000 MG CAPS Take 1 tablet by mouth 2 (two) times daily. 06/08/14  Yes Sueanne Margarita, MD  ONE TOUCH ULTRA TEST test strip TEST 3 TIMES A DAY AS DIRECTED 08/22/14  Yes Tammi Sou, MD  [facility] DELICA LANCETS 14G MISC 1 each by Other route 3 (three) times daily. USE TO TEST 3 TIMES A DAY 08/23/14  Yes Tammi Sou, MD  torsemide (DEMADEX) 20 MG tablet TAKE ONE TABLET BY MOUTH ONCE DAILY 07/13/14  Yes Tammi Sou, MD  cefUROXime (CEFTIN) 500 MG tablet Take 1 tablet (500 mg total) by mouth 2 (two) times daily with a meal. 09/20/14   Tammi Sou, MD  metFORMIN (GLUCOPHAGE) 1000 MG tablet Take 1 tablet (1,000 mg total) by mouth 2 (two) times daily with a meal. 05/03/14   Bruce Lemmie Evens Swords, MD   BP 133/102 mmHg  Pulse 99  Temp(Src) 99 F (37.2 C) (Oral)  Resp 18  SpO2 91% Physical Exam  Constitutional: He is oriented to person, place, and time. He appears well-developed and well-nourished.  HENT:  Head: Normocephalic and atraumatic.  Right Ear: External ear normal.  Left Ear: External ear normal.  Eyes: Conjunctivae and EOM are normal. Pupils are equal, round, and reactive to light.  Neck: Normal range of motion and phonation normal. Neck supple.  Cardiovascular: Normal rate, regular rhythm and normal heart sounds.   Pulmonary/Chest: Effort normal and breath sounds normal. He exhibits no  bony tenderness.  Abdominal: Soft. There is no tenderness.  Musculoskeletal:  Entire right leg is swollen as compared to the left.  There is mild right knee tenderness to palpation without deformity.  There is tenderness of the right ankle, and right foot with associated swelling.  There is no pain with movement of the right hip.  The right great toe is swollen.  There is redness over the right medial ankle, and right toe.  There is no proximal streaking or areas of fluctuance or drainage.  Neurological: He is alert and oriented  to person, place, and time. No cranial nerve deficit or sensory deficit. He exhibits normal muscle tone. Coordination normal.  Skin: Skin is warm, dry and intact.  Psychiatric: He has a normal mood and affect. His behavior is normal. Judgment and thought content normal.  Nursing note and vitals reviewed.   ED Course  Procedures (including critical care time)  Medications  HYDROmorphone (DILAUDID) injection 1 mg (1 mg Intramuscular Given 09/23/14 1138)  ondansetron (ZOFRAN-ODT) disintegrating tablet 8 mg (8 mg Oral Given 09/23/14 1137)    Patient Vitals for the past 24 hrs:  BP Temp Temp src Pulse Resp SpO2  09/23/14 1400 (!) 133/102 mmHg - - 99 18 91 %  09/23/14 1346 131/78 mmHg - - 95 18 94 %  09/23/14 1245 144/99 mmHg - - 96 - 93 %  09/23/14 1200 (!) 119/51 mmHg - - 96 - 93 %  09/23/14 1136 135/72 mmHg - - 97 20 99 %  09/23/14 1038 125/60 mmHg 99 F (37.2 C) Oral 91 20 96 %    3:00 PM Reevaluation with update and discussion. After initial assessment and treatment, an updated evaluation reveals no further complaints, he is comfortable.  I discussed findings with patient and family members, all questions answered. [facility] Review Labs Reviewed - No data to display  Imaging Review Dg Ankle Complete Right  09/23/2014   CLINICAL DATA:  [place] on Sunday; mechanical fall; landed face forward with scrapes to forehead. Evaluated by PCP on Monday.  Rib xray done; nothing broken. Increasing right knee pain since then; increased swelling in right leg from knee down to ankle. Knees not evaluated on Monday as they were not hurting then. Right ankle/foot is swollen and red. Pain all over anatomy being imaged.  EXAM: RIGHT ANKLE - COMPLETE 3+ VIEW  COMPARISON:  06/14/2014  FINDINGS: There is diffuse soft tissue swelling. Degenerative changes are seen in the ankle. No acute fracture or dislocation. No radiopaque foreign body or soft tissue gas. Small Achilles spur is noted.  IMPRESSION: Soft tissue swelling.   Electronically Signed   By: Shon Hale M.D.   On: 09/23/2014 11:31   Dg Knee Complete 4 Views Right  09/23/2014   CLINICAL DATA:  Fall, acute knee pain  EXAM: RIGHT KNEE - COMPLETE 4+ VIEW  COMPARISON:  09/23/2014  FINDINGS: Tricompartmental osteoarthritis noted, most pronounced in the medial compartment with joint space loss, sclerosis and bony spurring. Question small joint effusion on cross-table lateral view. No acute fracture or osseous abnormality demonstrated. Peripheral atherosclerosis noted of the femoral popliteal vessels. Diffuse subcutaneous edema of the right lower extremity.  IMPRESSION: Mild to moderate tricompartment osteoarthritis.  Small joint effusion  No acute fracture   Electronically Signed   By: Daryll Brod M.D.   On: 09/23/2014 11:33   Dg Foot Complete Right  09/23/2014   CLINICAL DATA:  Of mechanical fall.  Landed on face.  Foot pain.  EXAM: RIGHT FOOT COMPLETE - 3+ VIEW  COMPARISON:  None.  FINDINGS: No fracture or dislocation of mid foot or forefoot. The phalanges are normal. The calcaneus is normal. No soft tissue abnormality.  Degenerative change at the first metatarsal phalangeal joint. There is swelling of the dorsum of the foot.  IMPRESSION: 1. Soft tissue swelling of the dorsum of the foot. 2. No radiographic evidence of fracture.   Electronically Signed   By: Suzy Bouchard M.D.   On: 09/23/2014 11:31     EKG  Interpretation None  MDM   Final diagnoses:  Knee injury, right, initial encounter  Osteoarthritis of right knee, unspecified osteoarthritis type  Right leg swelling    Nonspecific right leg swelling without evidence for DVT, cellulitis or fracture.  Right knee arthritis is likely source of his pain.  Nursing Notes Reviewed/ Care Coordinated Applicable Imaging Reviewed Interpretation of Laboratory Data incorporated into ED treatment  The patient appears reasonably screened and/or stabilized for discharge and I doubt any other medical condition or other [facility] requiring further screening, evaluation, or treatment in the ED at this time prior to discharge.  Plan: Home Medications- Norco; Home Treatments- rest; return here if the recommended treatment, does not improve the symptoms; Recommended follow up- PCP in 4 days as scheduled     Richarda Blade, MD 09/23/14 1513

## 2014-09-23 NOTE — ED Notes (Signed)
Patient returned from Ultrasound. 

## 2014-09-23 NOTE — Discharge Instructions (Signed)
Elevate your feet above your heart, as much as possible.Use an Ace wrap on the right knee as needed to help your discomfort.   Arthritis, Nonspecific Arthritis is inflammation of a joint. This usually means pain, redness, warmth or swelling are present. One or more joints may be involved. There are a number of types of arthritis. Your caregiver may not be able to tell what type of arthritis you have right away. CAUSES  The most common cause of arthritis is the wear and tear on the joint (osteoarthritis). This causes damage to the cartilage, which can break down over time. The knees, hips, back and neck are most often affected by this type of arthritis. Other types of arthritis and common causes of joint pain include:  Sprains and other injuries near the joint. Sometimes minor sprains and injuries cause pain and swelling that develop hours later.  Rheumatoid arthritis. This affects hands, feet and knees. It usually affects both sides of your body at the same time. It is often associated with chronic ailments, fever, weight loss and general weakness.  Crystal arthritis. Gout and pseudo gout can cause occasional acute severe pain, redness and swelling in the foot, ankle, or knee.  Infectious arthritis. Bacteria can get into a joint through a break in overlying skin. This can cause infection of the joint. Bacteria and viruses can also spread through the blood and affect your joints.  Drug, infectious and allergy reactions. Sometimes joints can become mildly painful and slightly swollen with these types of illnesses. SYMPTOMS   Pain is the main symptom.  Your joint or joints can also be red, swollen and warm or hot to the touch.  You may have a fever with certain types of arthritis, or even feel overall ill.  The joint with arthritis will hurt with movement. Stiffness is present with some types of arthritis. DIAGNOSIS  Your caregiver will suspect arthritis based on your description of your  symptoms and on your exam. Testing may be needed to find the type of arthritis:  Blood and sometimes urine tests.  X-ray tests and sometimes CT or MRI scans.  Removal of fluid from the joint (arthrocentesis) is done to check for bacteria, crystals or other causes. Your caregiver (or a specialist) will numb the area over the joint with a local anesthetic, and use a needle to remove joint fluid for examination. This procedure is only minimally uncomfortable.  Even with these tests, your caregiver may not be able to tell what kind of arthritis you have. Consultation with a specialist (rheumatologist) may be helpful. TREATMENT  Your caregiver will discuss with you treatment specific to your type of arthritis. If the specific type cannot be determined, then the following general recommendations may apply. Treatment of severe joint pain includes:  Rest.  Elevation.  Anti-inflammatory medication (for example, ibuprofen) may be prescribed. Avoiding activities that cause increased pain.  Only take over-the-counter or prescription medicines for pain and discomfort as recommended by your caregiver.  Cold packs over an inflamed joint may be used for 10 to 15 minutes every hour. Hot packs sometimes feel better, but do not use overnight. Do not use hot packs if you are diabetic without your caregiver's permission.  A cortisone shot into arthritic joints may help reduce pain and swelling.  Any acute arthritis that gets worse over the next 1 to 2 days needs to be looked at to be sure there is no joint infection. Long-term arthritis treatment involves modifying activities and lifestyle to reduce  joint stress jarring. This can include weight loss. Also, exercise is needed to nourish the joint cartilage and remove waste. This helps keep the muscles around the joint strong. HOME CARE INSTRUCTIONS   Do not take aspirin to relieve pain if gout is suspected. This elevates uric acid levels.  Only take  over-the-counter or prescription medicines for pain, discomfort or fever as directed by your caregiver.  Rest the joint as much as possible.  If your joint is swollen, keep it elevated.  Use crutches if the painful joint is in your leg.  Drinking plenty of fluids may help for certain types of arthritis.  Follow your caregiver's dietary instructions.  Try low-impact exercise such as:  Swimming.  Water aerobics.  Biking.  Walking.  Morning stiffness is often relieved by a warm shower.  Put your joints through regular range-of-motion. SEEK MEDICAL CARE IF:   You do not feel better in 24 hours or are getting worse.  You have side effects to medications, or are not getting better with treatment. SEEK IMMEDIATE MEDICAL CARE IF:   You have a fever.  You develop severe joint pain, swelling or redness.  Many joints are involved and become painful and swollen.  There is severe back pain and/or leg weakness.  You have loss of bowel or bladder control. Document Released: 11/22/2004 Document Revised: 01/07/2012 Document Reviewed: 12/08/2008 [facility] Patient Information 2015 [place], [place]. This information is not intended to replace advice given to you by your health care provider. Make sure you discuss any questions you have with your health care provider.  Edema Edema is an abnormal buildup of fluids in your bodytissues. Edema is somewhatdependent on gravity to pull the fluid to the lowest place in your body. That makes the condition more common in the legs and thighs (lower extremities). Painless swelling of the feet and ankles is common and becomes more likely as you get older. It is also common in looser tissues, like around your eyes.  When the affected area is squeezed, the fluid may move out of that spot and leave a dent for a few moments. This dent is called pitting.  CAUSES  There are many possible causes of edema. Eating too much salt and being on your feet or sitting  for a long time can cause edema in your legs and ankles. Hot weather may make edema worse. Common medical causes of edema include:  Heart failure.  Liver disease.  Kidney disease.  Weak blood vessels in your legs.  Cancer.  An injury.  Pregnancy.  Some medications.  Obesity. SYMPTOMS  Edema is usually painless.Your skin may look swollen or shiny.  DIAGNOSIS  Your health care provider may be able to diagnose edema by asking about your medical history and doing a physical exam. You may need to have tests such as X-rays, an electrocardiogram, or blood tests to check for medical conditions that may cause edema.  TREATMENT  Edema treatment depends on the cause. If you have heart, liver, or kidney disease, you need the treatment appropriate for these conditions. General treatment may include:  Elevation of the affected body part above the level of your heart.  Compression of the affected body part. Pressure from elastic bandages or support stockings squeezes the tissues and forces fluid back into the blood vessels. This keeps fluid from entering the tissues.  Restriction of fluid and salt intake.  Use of a water pill (diuretic). These medications are appropriate only for some types of edema. They pull  fluid out of your body and make you urinate more often. This gets rid of fluid and reduces swelling, but diuretics can have side effects. Only use diuretics as directed by your health care provider. HOME CARE INSTRUCTIONS   Keep the affected body part above the level of your heart when you are lying down.   Do not sit still or stand for prolonged periods.   Do not put anything directly under your knees when lying down.  Do not wear constricting clothing or garters on your upper legs.   Exercise your legs to work the fluid back into your blood vessels. This may help the swelling go down.   Wear elastic bandages or support stockings to reduce ankle swelling as directed by your  health care provider.   Eat a low-salt diet to reduce fluid if your health care provider recommends it.   Only take medicines as directed by your health care provider. SEEK MEDICAL CARE IF:   Your edema is not responding to treatment.  You have heart, liver, or kidney disease and notice symptoms of edema.  You have edema in your legs that does not improve after elevating them.   You have sudden and unexplained weight gain. SEEK IMMEDIATE MEDICAL CARE IF:   You develop shortness of breath or chest pain.   You cannot breathe when you lie down.  You develop pain, redness, or warmth in the swollen areas.   You have heart, liver, or kidney disease and suddenly get edema.  You have a fever and your symptoms suddenly get worse. MAKE SURE YOU:   Understand these instructions.  Will watch your condition.  Will get help right away if you are not doing well or get worse. Document Released: 10/15/2005 Document Revised: 03/01/2014 Document Reviewed: 08/07/2013 [facility] Patient Information 2015 [place], [place]. This information is not intended to replace advice given to you by your health care provider. Make sure you discuss any questions you have with your health care provider.

## 2014-09-23 NOTE — ED Notes (Signed)
Patient returned from X-ray 

## 2014-09-23 NOTE — ED Notes (Signed)
Patient transported to X-ray 

## 2014-09-27 ENCOUNTER — Other Ambulatory Visit (INDEPENDENT_AMBULATORY_CARE_PROVIDER_SITE_OTHER): Payer: Medicare Other

## 2014-09-27 DIAGNOSIS — N182 Chronic kidney disease, stage 2 (mild): Secondary | ICD-10-CM

## 2014-09-27 LAB — BASIC METABOLIC PANEL
BUN: 30 mg/dL — ABNORMAL HIGH (ref 6–23)
CALCIUM: 8.7 mg/dL (ref 8.4–10.5)
CO2: 23 meq/L (ref 19–32)
Chloride: 99 mEq/L (ref 96–112)
Creatinine, Ser: 1.2 mg/dL (ref 0.4–1.5)
GFR: 63.15 mL/min (ref >60.00)
GLUCOSE: 94 mg/dL (ref 70–99)
Potassium: 4.6 mEq/L (ref 3.5–5.1)
Sodium: 133 mEq/L — ABNORMAL LOW (ref 135–145)

## 2014-09-28 ENCOUNTER — Other Ambulatory Visit: Payer: Self-pay | Admitting: Family Medicine

## 2014-09-30 ENCOUNTER — Encounter: Payer: Self-pay | Admitting: Family Medicine

## 2014-09-30 ENCOUNTER — Ambulatory Visit (INDEPENDENT_AMBULATORY_CARE_PROVIDER_SITE_OTHER): Payer: Medicare Other | Admitting: Family Medicine

## 2014-09-30 VITALS — BP 114/69 | HR 91 | Temp 96.8°F | Resp 18 | Ht 69.0 in | Wt 312.0 lb

## 2014-09-30 DIAGNOSIS — R270 Ataxia, unspecified: Secondary | ICD-10-CM

## 2014-09-30 DIAGNOSIS — H9190 Unspecified hearing loss, unspecified ear: Secondary | ICD-10-CM

## 2014-09-30 DIAGNOSIS — Z23 Encounter for immunization: Secondary | ICD-10-CM

## 2014-09-30 DIAGNOSIS — Z0000 Encounter for general adult medical examination without abnormal findings: Secondary | ICD-10-CM

## 2014-09-30 NOTE — Progress Notes (Signed)
The patient is here for annual Medicare wellness examination and management of other chronic and acute problems.   The risk factors are reflected in the social history.  The roster of all physicians providing medical care to patient - is listed in the Snapshot section of the chart.  Activities of daily living:  The patient is 100% inedpendent in the following ADLs:  toileting, feeding.  Requires assistance with putting shoes on and he requires a walker for ambulation, currently awaiting approval for a hover-round.  Home safety : The patient has smoke detectors in the home. They wear seatbelts.No firearms at home ( firearms are present in the home, kept in a safe fashion). There is no violence in the home.   There is no risks for hepatitis, STDs or HIV. There is no   history of blood transfusion. They have no travel history to infectious disease endemic areas of the world.  The patient has not seen their dentist in the last six month-he has had dentures x >10 yrs. They have seen their eye doctor in the last year. They admit to hearing difficulty but  have not had audiologic testing in the last year.  They do not  have excessive sun exposure. Discussed the need for sun protection: hats, long sleeves and use of sunscreen if there is significant sun exposure.   Diet: the importance of a healthy diet is discussed. They do have a healthy (unhealthy-high fat/fast food) diet.  He admits that living with his daughter and grandchildren make it difficult to eat a heart healthy diet with consistency.  The patient does not have a regular exercise program due to his CV/pulm/musculo comorbidities.  The benefits of regular aerobic exercise were discussed.  Depression screen: there are no signs or vegative symptoms of depression- irritability, change in appetite, anhedonia, sadness/tearfullness.  Cognitive assessment: the patient manages all their financial and personal affairs and is actively engaged. They could  relate day,date,year and events; recalled 3/3 objects at 3 minutes; performed clock-face test normally.  The following portions of the patient's history were reviewed and updated as appropriate: allergies, current medications, past family history, past medical history,  past surgical history, past social history  and problem list.  Vision, hearing, body mass index were assessed and reviewed.  Audiogram today showed mild deficits in both ears in all frequencies tested.  During the course of the visit the patient was educated and counseled about appropriate screening and preventive services including : fall prevention , diabetes screening, nutrition counseling, colorectal cancer screening, and recommended immunizations.  A written screening schedule was given to patient: No further cancer screening is recommended for this patient. We'll continue with annual eye exams. We'll continue with ongoing/appropriate lab testing for his chronic medical conditions. No new nutrition counseling planned.  He is awaiting hover-round device approval to help with mobility/fall prevention. Tdap and Prevnar given IM today. Pt referred to audiologist today for decreased hearing/abnormal audiogram ([place]) today.

## 2014-09-30 NOTE — Progress Notes (Signed)
Pre visit review using our clinic review tool, if applicable. No additional management support is needed unless otherwise documented below in the visit note. 

## 2014-10-07 ENCOUNTER — Encounter (HOSPITAL_COMMUNITY): Payer: Self-pay | Admitting: Interventional Cardiology

## 2014-10-08 ENCOUNTER — Other Ambulatory Visit (INDEPENDENT_AMBULATORY_CARE_PROVIDER_SITE_OTHER): Payer: Medicare Other | Admitting: *Deleted

## 2014-10-08 DIAGNOSIS — E785 Hyperlipidemia, unspecified: Secondary | ICD-10-CM

## 2014-10-08 LAB — HEPATIC FUNCTION PANEL
ALK PHOS: 75 U/L (ref 39–117)
ALT: 23 U/L (ref 0–53)
AST: 23 U/L (ref 0–37)
Albumin: 3.5 g/dL (ref 3.5–5.2)
Bilirubin, Direct: 0 mg/dL (ref 0.0–0.3)
Total Bilirubin: 0.5 mg/dL (ref 0.2–1.2)
Total Protein: 7.5 g/dL (ref 6.0–8.3)

## 2014-10-08 LAB — LIPID PANEL
Cholesterol: 150 mg/dL (ref 0–200)
HDL: 25.3 mg/dL — ABNORMAL LOW (ref >39.00)
NONHDL: 124.7
Total CHOL/HDL Ratio: 6
Triglycerides: 316 mg/dL — ABNORMAL HIGH (ref 0.0–149.0)
VLDL: 63.2 mg/dL — ABNORMAL HIGH (ref 0.0–40.0)

## 2014-10-08 LAB — LDL CHOLESTEROL, DIRECT: Direct LDL: 73.8 mg/dL

## 2014-10-13 ENCOUNTER — Other Ambulatory Visit: Payer: Self-pay

## 2014-10-13 MED ORDER — CLOPIDOGREL BISULFATE 75 MG PO TABS: 75.0000 mg | ORAL_TABLET | Freq: Every day | ORAL | Status: DC

## 2014-10-13 MED ORDER — ISOSORBIDE MONONITRATE ER 30 MG PO TB24: 30.0000 mg | ORAL_TABLET | Freq: Every day | ORAL | Status: DC

## 2014-10-14 ENCOUNTER — Other Ambulatory Visit: Payer: Self-pay | Admitting: Family Medicine

## 2014-10-14 ENCOUNTER — Telehealth: Payer: Self-pay | Admitting: Cardiology

## 2014-10-14 MED ORDER — CLOPIDOGREL BISULFATE 75 MG PO TABS: 75.0000 mg | ORAL_TABLET | Freq: Every day | ORAL | Status: DC

## 2014-10-14 MED ORDER — ISOSORBIDE MONONITRATE ER 30 MG PO TB24: 30.0000 mg | ORAL_TABLET | Freq: Every day | ORAL | Status: DC

## 2014-10-14 NOTE — Telephone Encounter (Signed)
Returned call to patient.10/08/14 lipid results given.Stated he is not interested in going to [facility].Message sent to Dr.Turner's nurse

## 2014-10-14 NOTE — Telephone Encounter (Signed)
New Msg     Pt requesting results of labs from Monday. Please return call at [phone].

## 2014-10-15 NOTE — Telephone Encounter (Signed)
To Dr. Turner for review. 

## 2014-10-16 ENCOUNTER — Ambulatory Visit (HOSPITAL_BASED_OUTPATIENT_CLINIC_OR_DEPARTMENT_OTHER)
Admission: RE | Admit: 2014-10-16 | Discharge: 2014-10-16 | Disposition: A | Discharge status: Home / Self Care | Payer: Medicare Other | Source: Ambulatory Visit | Attending: Family Medicine | Admitting: Family Medicine

## 2014-10-16 DIAGNOSIS — R270 Ataxia, unspecified: Secondary | ICD-10-CM

## 2014-10-16 NOTE — Telephone Encounter (Signed)
I received a call from the mobile MRI team today that this patient had been scheduled for a brain MRI with and without contrast for "ataxia", but I cannot find any documentation in his chart as to why this was ordered. The patient received a notice in his mail yesterday that his insurance company refused to pay for a contrasted scan and would cover a non-contrasted scan only. The team asked what we recommended. The patient refused the test if the cost is not covered. I told them to cancel the scan since this does not seem to be urgent. They will contact Dr. Ernestine Conrad on Monday to see what he wants to do.

## 2014-10-18 ENCOUNTER — Telehealth: Payer: Self-pay

## 2014-10-18 DIAGNOSIS — E785 Hyperlipidemia, unspecified: Secondary | ICD-10-CM

## 2014-10-18 MED ORDER — CVS FISH OIL 1000 MG PO CAPS: 2.0000 | ORAL_CAPSULE | Freq: Two times a day (BID) | ORAL | Status: DC

## 2014-10-18 NOTE — Telephone Encounter (Signed)
Instructed patient to INCREASE fish oil to 4 gm daily and to continue atorvastatin 40 mg daily. Fasting lab appointment scheduled for February 21, 2015.

## 2014-10-18 NOTE — Telephone Encounter (Signed)
-----   Message from Aris Georgia, [facility] sent at 10/18/2014 10:47 AM EST ----- RF: Diabetes, CAD, HTN, low HDL, age - LDL goal < 70, non-HDL goal < 100 Meds: Lipitor 40 mg qd, fish oil 2 gm daily A1c was 6.5 in November.  TG higher than last visit but better than in the past.   1. Increase fish oil to 4gm per day. 2. Continue atorvastatin 40 mg once daily. 3. Recheck lipid panel and hepatic panel in 4 months. Please notify patient, update meds, and set up labs. Thanks

## 2014-10-18 NOTE — Telephone Encounter (Deleted)
-----   Message from Aris Georgia, [facility] sent at 10/18/2014 10:47 AM EST ----- RF: Diabetes, CAD, HTN, low HDL, age - LDL goal < 70, non-HDL goal < 100 Meds: Lipitor 40 mg qd, fish oil 2 gm daily A1c was 6.5 in November.  TG higher than last visit but better than in the past.   1. Increase fish oil to 4gm per day. 2. Continue atorvastatin 40 mg once daily. 3. Recheck lipid panel and hepatic panel in 4 months. Please notify patient, update meds, and set up labs. Thanks

## 2014-10-19 ENCOUNTER — Telehealth: Payer: Self-pay | Admitting: Family Medicine

## 2014-10-19 ENCOUNTER — Other Ambulatory Visit: Payer: Self-pay | Admitting: Family Medicine

## 2014-10-19 MED ORDER — INSULIN ISOPHANE & REGULAR (HUMAN 70-30)100 UNIT/ML KWIKPEN: PEN_INJECTOR | SUBCUTANEOUS | Status: DC

## 2014-10-19 NOTE — Telephone Encounter (Signed)
OK, based on his current dosing of his current insulin, I will do rx for Humulin 70/30 insulin and he'll take 150 Units SQ with Breakfast and 90 Units SQ with supper.  Remind him to monitor glucose every morning fasting and again at supper every day.  A few days a week he should check another glucose either at bedtime or at lunch time.  -thx

## 2014-10-19 NOTE — Telephone Encounter (Signed)
Noted. Will put plans for MRI brain on hold indefinitely.

## 2014-10-19 NOTE — Telephone Encounter (Signed)
Pt LMOM stating that insurance will no longer cover novolin.  Pt will need to start humalin or humalog.   Please advise.

## 2014-10-20 NOTE — Telephone Encounter (Signed)
Patient aware of new medication and instructions.   He will monitor glucose as advised.

## 2014-10-26 ENCOUNTER — Telehealth: Payer: Self-pay | Admitting: Family Medicine

## 2014-10-26 ENCOUNTER — Ambulatory Visit (INDEPENDENT_AMBULATORY_CARE_PROVIDER_SITE_OTHER): Payer: Medicare Other | Admitting: Family Medicine

## 2014-10-26 ENCOUNTER — Encounter: Payer: Self-pay | Admitting: Family Medicine

## 2014-10-26 VITALS — BP 101/60 | HR 79 | Temp 97.2°F | Resp 22 | Ht 69.0 in | Wt 317.0 lb

## 2014-10-26 DIAGNOSIS — M25561 Pain in right knee: Secondary | ICD-10-CM

## 2014-10-26 DIAGNOSIS — M25461 Effusion, right knee: Secondary | ICD-10-CM

## 2014-10-26 DIAGNOSIS — S8001XS Contusion of right knee, sequela: Secondary | ICD-10-CM

## 2014-10-26 DIAGNOSIS — E119 Type 2 diabetes mellitus without complications: Secondary | ICD-10-CM

## 2014-10-26 MED ORDER — ZOSTER VACCINE LIVE 19400 UNT/0.65ML ~~LOC~~ SOLR: 0.6500 mL | Freq: Once | SUBCUTANEOUS | Status: DC

## 2014-10-26 MED ORDER — HYDROCODONE-ACETAMINOPHEN 5-325 MG PO TABS: 1.0000 | ORAL_TABLET | Freq: Four times a day (QID) | ORAL | Status: DC | PRN

## 2014-10-26 NOTE — Assessment & Plan Note (Signed)
He now describes having some "giving way" problems with this knee prior to his fall that he now thinks LED to the fall. It sounds like he may have landed on the knee and sustained a contusion, but also may have internal derangement like meniscal or ligament damage. Ortho referral for further eval/imaging. I rx'd #40 more Norco 5/325, take 1-2 q6h prn.  Therapeutic expectations and side effect profile of medication discussed today.  Patient's questions answered.

## 2014-10-26 NOTE — Progress Notes (Signed)
Pre visit review using our clinic review tool, if applicable. No additional management support is needed unless otherwise documented below in the visit note. 

## 2014-10-26 NOTE — Progress Notes (Signed)
OFFICE NOTE  10/26/2014  CC:  Chief Complaint  Patient presents with  . Knee Pain  . Medication Problem    need to change quantity of humalin    HPI: Patient is a 68 y.o. Caucasian male who is here for right knee pain since 09/17/14, when he fell.  Describes waxing/waning pain, swelling, and the feeling of "giving way".  He is out of pain meds, but when he had norco it helped when he took 2 tabs.  Also, recent change to new insulin: the amount dispensed (15 ml) will only be a 4 day supply since he is on such high doses of insulin.  Plus the pen will inject only 60 U of insulin at a time so one dose of his insulin has to be divided into 3 separate injections.  Pertinent PMH:  Past medical, surgical, social, and family history reviewed and no changes are noted since last office visit.  MEDS:  Outpatient Prescriptions Prior to Visit  Medication Sig Dispense Refill  . aspirin 81 MG tablet Take 81 mg by mouth daily.      Marland Kitchen atorvastatin (LIPITOR) 80 MG tablet Take 0.5 tablets (40 mg total) by mouth daily at 6 PM. 30 tablet 6  . beta carotene w/minerals (OCUVITE) tablet Take 1 tablet by mouth daily.    . cetirizine (ZYRTEC) 10 MG tablet Take 10 mg by mouth daily.    . citalopram (CELEXA) 20 MG tablet Take 1 tablet (20 mg total) by mouth daily. 30 tablet 6  . clonazePAM (KLONOPIN) 1 MG tablet Take 1 tablet (1 mg total) by mouth 2 (two) times daily as needed for anxiety. 60 tablet 5  . clopidogrel (PLAVIX) 75 MG tablet Take 1 tablet (75 mg total) by mouth daily with breakfast. 30 tablet 3  . clotrimazole-betamethasone (LOTRISONE) cream APPLY   TOPICALLY TO AFFECTED AREA TWICE DAILY 45 g 3  . diltiazem (CARDIZEM CD) 120 MG 24 hr capsule TAKE ONE CAPSULE BY MOUTH ONCE DAILY 30 capsule 5  . gabapentin (NEURONTIN) 300 MG capsule 1 cap qAM, 1 cap mid afternoon, and 2 caps at bedtime 120 capsule 6  . hydrOXYzine (ATARAX/VISTARIL) 25 MG tablet Take 1 tablet (25 mg total) by mouth every 4 (four) hours  as needed for anxiety or itching. 60 tablet 5  . Insulin Isophane & Regular Human (HUMULIN 70/30 MIX) (70-30) 100 UNIT/ML PEN 150 U SQ q Breakfast and 90 U SQ q Supper 15 mL 11  . isosorbide mononitrate (IMDUR) 30 MG 24 hr tablet Take 1 tablet (30 mg total) by mouth daily. 30 tablet 3  . lisinopril (PRINIVIL,ZESTRIL) 40 MG tablet TAKE ONE TABLET BY MOUTH ONCE DAILY 90 tablet 1  . Omega-3 Fatty Acids ([employer] FISH OIL) 1000 MG CAPS Take 2 tablets by mouth 2 (two) times daily. 90 capsule   . torsemide (DEMADEX) 20 MG tablet TAKE ONE TABLET BY MOUTH ONCE DAILY 90 tablet 4  . cephALEXin (KEFLEX) 500 MG capsule Take 500 mg by mouth every 6 (six) hours. X 5 days    . HYDROcodone-acetaminophen (NORCO) 5-325 MG per tablet Take 1 tablet by mouth every 4 (four) hours as needed. (Patient not taking: Reported on 09/30/2014) 20 tablet 0  . metFORMIN (GLUCOPHAGE) 1000 MG tablet Take 1 tablet (1,000 mg total) by mouth 2 (two) times daily with a meal. (Patient not taking: Reported on 10/26/2014) 60 tablet 5  . ONE TOUCH ULTRA TEST test strip TEST 3 TIMES A DAY AS DIRECTED 100 each 11  .  ONETOUCH DELICA LANCETS 84X MISC 1 each by Other route 3 (three) times daily. USE TO TEST 3 TIMES A DAY 100 each 11   No facility-administered medications prior to visit.    PE: Blood pressure 101/60, pulse 79, temperature 97.2 F (36.2 C), temperature source Temporal, resp. rate 22, height 5\' 9"  (1.753 m), weight 317 lb (143.79 kg), SpO2 96 %. Gen: Alert, well appearing.  Patient is oriented to person, place, time, and situation. Obese, ambulates with walker, wearing oxygen. Right knee mildly swollen but no erythema, +warmth in lower portion.  Tenderness diffusely in anterior aspect, esp inferiorly and over proximal tibia region, with mild swelling in prox tibia region.  No popliteal tenderness, swelling, or mass.  No varus or valgus pain with stress, no laxity is noted with varus/valgus stress or A/P stress.   He can flex knee to  100 deg and extend it fully.  IMPRESSION AND PLAN:  Right knee pain He now describes having some "giving way" problems with this knee prior to his fall that he now thinks LED to the fall. It sounds like he may have landed on the knee and sustained a contusion, but also may have internal derangement like meniscal or ligament damage. Ortho referral for further eval/imaging. I rx'd #40 more Norco 5/325, take 1-2 q6h prn.  Therapeutic expectations and side effect profile of medication discussed today.  Patient's questions answered.    2) DM 2: having insulin issues due to insurance formulary changes. Will look into trying to change him to a concentrated insulin if insurer will cover.  An After Visit Summary was printed and given to the patient.  FOLLOW UP: prn

## 2014-10-27 MED ORDER — INSULIN ISOPHANE HUMAN 100 UNIT/ML KWIKPEN: PEN_INJECTOR | SUBCUTANEOUS | Status: DC

## 2014-10-27 MED ORDER — INSULIN LISPRO 200 UNIT/ML ~~LOC~~ SOPN: 24.0000 [IU] | PEN_INJECTOR | Freq: Three times a day (TID) | SUBCUTANEOUS | Status: DC

## 2014-10-27 NOTE — Telephone Encounter (Signed)
Pls call and notify pt that I have changed his insulin to fit the formulary requirements and try to alleviate some of the problems he was having with the latest insulin changes I made.Marland Kitchen He can finish out the current insulin he has on hand as prescribed, but I have sent new insulins to his pharmacy so he can start these when he is all out of his current ones.  They have clear instructions on them but if he has questions tell him to please call or come in for appt to discuss them.-thx

## 2014-10-28 NOTE — Telephone Encounter (Signed)
Patient aware.  Pt wanted you to know that he still didn't get brain scan.  He said his insurance won't pay for it unless it's without dye.  FYI.

## 2014-11-07 DIAGNOSIS — J449 Chronic obstructive pulmonary disease, unspecified: Secondary | ICD-10-CM | POA: Diagnosis not present

## 2014-11-08 NOTE — Telephone Encounter (Signed)
Pls call pt's pharmacy and tell them that I authorize enough Humulin NPH kwikpen 100 U/ml at his current dosing (170 units per day total) to last him 1 month, and I authorize 10 additional RF's of this.  -thx

## 2014-11-08 NOTE — Telephone Encounter (Signed)
Pt called, he states he is using 170 units a day of insulin. He received 1 box of Humulin-N qwikpen which has 2 pens in it but he states he will run out of this by Friday. The pharmacy told him to call us because his insulin is not lasting him. Please advise.

## 2014-11-08 NOTE — Telephone Encounter (Signed)
Patient Name: [name]  DOB: 02/07/1946    Nurse Assessment  Nurse: Raphael Gibney, RN, Vanita Ingles Date/Time (Eastern Time): 11/08/2014 11:10:08 AM  Confirm and document reason for call. If symptomatic, describe symptoms. ---Caller states he is trying to get in touch with Dr. Idelle Leech nurse. He has been getting 3 boxes of Humalog quik pen 24 units TID. He only got 1 box of Humulin N pen. He uses 120 units in the am and 50 units in the evening. Pen does not last 2 days. Uses [facility] on [address].  Has the patient traveled out of the country within the last 30 days? ---Not Applicable  Does the patient require triage? ---No  Please document clinical information provided and list any resource used. ---Warm transferred to [facility] at the office.     Guidelines    Guideline Title Affirmed Question Affirmed Notes       Final Disposition User

## 2014-11-09 MED ORDER — INSULIN ISOPHANE HUMAN 100 UNIT/ML KWIKPEN: PEN_INJECTOR | SUBCUTANEOUS | Status: DC

## 2014-11-09 NOTE — Telephone Encounter (Signed)
I called the pharmacy and they state we have to send in another Rx so they can state to the insurance that it was a change in therapy. The original Rx states pt is using 24 units a day versus 170 per day. I tried to change Rx directions but unable and I don't want to send in the wrong Rx.

## 2014-11-09 NOTE — Telephone Encounter (Signed)
Pls try to clarify this with pharmacy: I think they are confusing his mealtime (humalog kwikpen) dosing rx with his NPH dosing.  Pls refer to his med list/sig in chart for these meds.-thx

## 2014-11-09 NOTE — Telephone Encounter (Signed)
OK, I sent in new rx for the NPH and hopefully that will suffice. Thanks.

## 2014-11-09 NOTE — Telephone Encounter (Signed)
They were but I called back and the pharmacist stated he only gets 1 box of pens which last him eight days. She states we can rewrite the Rx so he can have 4 boxes which would give him enough Insulin for 33 days. The way it is written now he has to refill every week to get the amount of insulin he needs.

## 2014-11-11 DIAGNOSIS — J449 Chronic obstructive pulmonary disease, unspecified: Secondary | ICD-10-CM | POA: Diagnosis not present

## 2014-11-11 DIAGNOSIS — Z9981 Dependence on supplemental oxygen: Secondary | ICD-10-CM | POA: Diagnosis not present

## 2014-11-11 DIAGNOSIS — M6281 Muscle weakness (generalized): Secondary | ICD-10-CM | POA: Diagnosis not present

## 2014-11-11 DIAGNOSIS — I509 Heart failure, unspecified: Secondary | ICD-10-CM | POA: Diagnosis not present

## 2014-11-11 NOTE — Telephone Encounter (Signed)
Spoke with pt, advised we sent in another Rx for his Insulin. Pt understood.

## 2014-11-13 DIAGNOSIS — M238X1 Other internal derangements of right knee: Secondary | ICD-10-CM | POA: Diagnosis not present

## 2014-11-13 DIAGNOSIS — M1711 Unilateral primary osteoarthritis, right knee: Secondary | ICD-10-CM | POA: Diagnosis not present

## 2014-11-24 DIAGNOSIS — M1711 Unilateral primary osteoarthritis, right knee: Secondary | ICD-10-CM | POA: Diagnosis not present

## 2014-11-27 DIAGNOSIS — M23321 Other meniscus derangements, posterior horn of medial meniscus, right knee: Secondary | ICD-10-CM | POA: Diagnosis not present

## 2014-12-01 ENCOUNTER — Telehealth: Payer: Self-pay

## 2014-12-01 NOTE — Telephone Encounter (Signed)
Patient has no complaints. He said he feels great. No CP, some very mild SOB on exertion but nothing new.  To Dr. Radford Pax for review.

## 2014-12-01 NOTE — Telephone Encounter (Signed)
Called patient to ask if he is having any symptoms (SOB, CP) prior to sending surgical clearance for procedure on R knee.  Unable to leave message, will try again later.

## 2014-12-02 ENCOUNTER — Telehealth: Payer: Self-pay | Admitting: Cardiology

## 2014-12-02 NOTE — Telephone Encounter (Signed)
Received request from Nurse fax box:   To: [employer] Fax number: [phone]

## 2014-12-03 ENCOUNTER — Other Ambulatory Visit: Payer: Self-pay | Admitting: Surgical

## 2014-12-03 NOTE — Patient Instructions (Signed)
[name]  12/03/2014   Your procedure is scheduled on: 12/16/14   Report to [facility] Main  Entrance and follow signs to               [name] at 10:30 AM.   Call this number if you have problems the morning of surgery [phone]   Remember:  Do not eat food or drink liquids :After Midnight.     Take these medicines the morning of surgery with A SIP OF WATER:                                You may not have any metal on your body including hair pins and              piercings  Do not wear jewelry, make-up, lotions, powders or perfumes.             Do not wear nail polish.  Do not shave  48 hours prior to surgery.              Men may shave face and neck.   Do not bring valuables to the hospital. [name].  Contacts, dentures or bridgework may not be worn into surgery.  Leave suitcase in the car. After surgery it may be brought to your room.     Patients discharged the day of surgery will not be allowed to drive home.  Name and phone number of your driver:  Special Instructions: N/A              Please read over the following fact sheets you were given: _____________________________________________________________________                                                     [place]  Before surgery, you can play an important role.  Because skin is not sterile, your skin needs to be as free of germs as possible.  You can reduce the number of germs on your skin by washing with CHG (chlorahexidine gluconate) soap before surgery.  CHG is an antiseptic cleaner which kills germs and bonds with the skin to continue killing germs even after washing. Please DO NOT use if you have an allergy to CHG or antibacterial soaps.  If your skin becomes reddened/irritated stop using the CHG and inform your nurse when you arrive at Short Stay. Do not shave (including legs and  underarms) for at least 48 hours prior to the first CHG shower.  You may shave your face. Please follow these instructions carefully:   1.  Shower with CHG Soap the night before surgery and the  morning of Surgery.   2.  If you choose to wash your hair, wash your hair first as usual with your  normal  Shampoo.   3.  After you shampoo, rinse your hair and body thoroughly to remove the  shampoo.  4.  Use CHG as you would any other liquid soap.  You can apply chg directly  to the skin and wash . Gently wash with scrungie or clean wascloth    5.  Apply the CHG Soap to your body ONLY FROM THE NECK DOWN.   Do not use on open                           Wound or open sores. Avoid contact with eyes, ears mouth and genitals (private parts).                        Genitals (private parts) with your normal soap.              6.  Wash thoroughly, paying special attention to the area where your surgery  will be performed.   7.  Thoroughly rinse your body with warm water from the neck down.   8.  DO NOT shower/wash with your normal soap after using and rinsing off  the CHG Soap .                9.  Pat yourself dry with a clean towel.             10.  Wear clean pajamas.             11.  Place clean sheets on your bed the night of your first shower and do not  sleep with pets.  Day of Surgery : Do not apply any lotions/deodorants the morning of surgery.  Please wear clean clothes to the hospital/surgery center.  FAILURE TO FOLLOW THESE INSTRUCTIONS MAY RESULT IN THE CANCELLATION OF YOUR SURGERY    PATIENT SIGNATURE_________________________________  ______________________________________________________________________

## 2014-12-06 ENCOUNTER — Inpatient Hospital Stay (HOSPITAL_COMMUNITY)
Admission: RE | Admit: 2014-12-06 | Discharge: 2014-12-06 | Disposition: A | Discharge status: Home / Self Care | Payer: Self-pay | Source: Ambulatory Visit

## 2014-12-07 NOTE — Progress Notes (Signed)
Surgery clearance note Dr. Radford Pax 09/16/14 on chart, EKG 09/16/14 on EPIC

## 2014-12-08 ENCOUNTER — Encounter (HOSPITAL_COMMUNITY)
Admission: RE | Admit: 2014-12-08 | Discharge: 2014-12-08 | Disposition: A | Discharge status: Home / Self Care | Payer: Medicare Other | Source: Ambulatory Visit | Attending: Orthopedic Surgery | Admitting: Orthopedic Surgery

## 2014-12-08 ENCOUNTER — Encounter (HOSPITAL_COMMUNITY): Payer: Self-pay

## 2014-12-08 ENCOUNTER — Ambulatory Visit (HOSPITAL_COMMUNITY)
Admission: RE | Admit: 2014-12-08 | Discharge: 2014-12-08 | Disposition: A | Discharge status: Home / Self Care | Payer: Medicare Other | Source: Ambulatory Visit | Attending: Anesthesiology | Admitting: Anesthesiology

## 2014-12-08 DIAGNOSIS — I509 Heart failure, unspecified: Secondary | ICD-10-CM | POA: Diagnosis not present

## 2014-12-08 DIAGNOSIS — I10 Essential (primary) hypertension: Secondary | ICD-10-CM | POA: Diagnosis not present

## 2014-12-08 DIAGNOSIS — M25561 Pain in right knee: Secondary | ICD-10-CM | POA: Insufficient documentation

## 2014-12-08 DIAGNOSIS — J449 Chronic obstructive pulmonary disease, unspecified: Secondary | ICD-10-CM | POA: Diagnosis not present

## 2014-12-08 DIAGNOSIS — E119 Type 2 diabetes mellitus without complications: Secondary | ICD-10-CM | POA: Insufficient documentation

## 2014-12-08 DIAGNOSIS — R9389 Abnormal findings on diagnostic imaging of other specified body structures: Secondary | ICD-10-CM

## 2014-12-08 DIAGNOSIS — Z01818 Encounter for other preprocedural examination: Secondary | ICD-10-CM | POA: Diagnosis not present

## 2014-12-08 HISTORY — DX: Acute myocardial infarction, unspecified: I21.9

## 2014-12-08 HISTORY — DX: Complete traumatic metacarpophalangeal amputation of left little finger, initial encounter: S68.117A

## 2014-12-08 LAB — CBC WITH DIFFERENTIAL/PLATELET
Basophils Absolute: 0 10*3/uL (ref 0.0–0.1)
Basophils Relative: 0 % (ref 0–1)
Eosinophils Absolute: 0.3 10*3/uL (ref 0.0–0.7)
Eosinophils Relative: 4 % (ref 0–5)
HCT: 41.9 % (ref 39.0–52.0)
Hemoglobin: 12.7 g/dL — ABNORMAL LOW (ref 13.0–17.0)
Lymphocytes Relative: 19 % (ref 12–46)
Lymphs Abs: 1.5 10*3/uL (ref 0.7–4.0)
MCH: 27 pg (ref 26.0–34.0)
MCHC: 30.3 g/dL (ref 30.0–36.0)
MCV: 89 fL (ref 78.0–100.0)
Monocytes Absolute: 1 10*3/uL (ref 0.1–1.0)
Monocytes Relative: 13 % — ABNORMAL HIGH (ref 3–12)
Neutro Abs: 4.8 10*3/uL (ref 1.7–7.7)
Neutrophils Relative %: 64 % (ref 43–77)
Platelets: 194 10*3/uL (ref 150–400)
RBC: 4.71 MIL/uL (ref 4.22–5.81)
RDW: 16.4 % — ABNORMAL HIGH (ref 11.5–15.5)
WBC: 7.5 10*3/uL (ref 4.0–10.5)

## 2014-12-08 LAB — COMPREHENSIVE METABOLIC PANEL
ALT: 30 U/L (ref 0–53)
AST: 28 U/L (ref 0–37)
Albumin: 3.9 g/dL (ref 3.5–5.2)
Alkaline Phosphatase: 60 U/L (ref 39–117)
Anion gap: 8 (ref 5–15)
BUN: 20 mg/dL (ref 6–23)
CO2: 30 mmol/L (ref 19–32)
Calcium: 9.4 mg/dL (ref 8.4–10.5)
Chloride: 99 mmol/L (ref 96–112)
Creatinine, Ser: 0.92 mg/dL (ref 0.50–1.35)
GFR calc Af Amer: >90 mL/min (ref >90)
GFR calc non Af Amer: 84 mL/min — ABNORMAL LOW (ref >90)
Glucose, Bld: 188 mg/dL — ABNORMAL HIGH (ref 70–99)
Potassium: 4.4 mmol/L (ref 3.5–5.1)
Sodium: 137 mmol/L (ref 135–145)
Total Bilirubin: 0.5 mg/dL (ref 0.3–1.2)
Total Protein: 7.5 g/dL (ref 6.0–8.3)

## 2014-12-08 NOTE — Patient Instructions (Signed)
19 [name]  12/08/2014   Your procedure is scheduled on:     Thursday, December 16, 2014  Report to [facility] Main Entrance and follow signs to  [facility] arrive at 10:30 AM.   Call this number if you have problems the morning of surgery [phone] or Presurgical Testing [phone].   Remember:  Do not eat food or drink liquids :After Midnight.  EAT HEALTHY SNACK NIGHT PRIOR TO SURGERY.       Take these medicines the morning of surgery with A SIP OF WATER: ZYRTEC IF NEEDED;CITALOPRAM;CLONAZEPAM IF NEEDED;DILTIAZEM (cardizem);             GABAPENTIN; HYDROCODONE-ACETAMINOPHEN IF NEEDED;ISOSORBIDE MONONITRATE (Imdur);            TAKE 1/2 USUAL DOSE OF INSULIN NIGHT PRIOR TO SURGERY                               You may not have any metal on your body including hair pins and piercings  Do not wear jewelry, cologne, lotions, powders, or deodorant.  Men may shave face and neck.               Do not bring valuables to the hospital. [place].  Contacts, dentures or bridgework may not be worn into surgery.     Patients discharged the day of surgery will not be allowed to drive home.  Name and phone number of your driver:Deloris Royster (daughter)   ________________________________________________________________________  [facility] - Preparing for Surgery Before surgery, you can play an important role.  Because skin is not sterile, your skin needs to be as free of germs as possible.  You can reduce the number of germs on your skin by washing with CHG (chlorahexidine gluconate) soap before surgery.  CHG is an antiseptic cleaner which kills germs and bonds with the skin to continue killing germs even after washing. Please DO NOT use if you have an allergy to CHG or antibacterial soaps.  If your skin becomes reddened/irritated stop using the CHG and inform your nurse when you arrive at Short Stay. Do not shave (including legs  and underarms) for at least 48 hours prior to the first CHG shower.  You may shave your face/neck. Please follow these instructions carefully:  1.  Shower with CHG Soap the night before surgery and the  morning of Surgery.  2.  If you choose to wash your hair, wash your hair first as usual with your  normal  shampoo.  3.  After you shampoo, rinse your hair and body thoroughly to remove the  shampoo.                           4.  Use CHG as you would any other liquid soap.  You can apply chg directly  to the skin and wash                       Gently with a scrungie or clean washcloth.  5.  Apply the CHG Soap to your body ONLY FROM THE NECK DOWN.   Do not use on face/ open                           Wound or open sores. Avoid contact with  eyes, ears mouth and genitals (private parts).                       Wash face,  Genitals (private parts) with your normal soap.             6.  Wash thoroughly, paying special attention to the area where your surgery  will be performed.  7.  Thoroughly rinse your body with warm water from the neck down.  8.  DO NOT shower/wash with your normal soap after using and rinsing off  the CHG Soap.                9.  Pat yourself dry with a clean towel.            10.  Wear clean pajamas.            11.  Place clean sheets on your bed the night of your first shower and do not  sleep with pets. Day of Surgery : Do not apply any lotions/deodorants the morning of surgery.  Please wear clean clothes to the hospital/surgery center.  FAILURE TO FOLLOW THESE INSTRUCTIONS MAY RESULT IN THE CANCELLATION OF YOUR SURGERY PATIENT SIGNATURE_________________________________  NURSE SIGNATURE__________________________________  ________________________________________________________________________

## 2014-12-08 NOTE — Progress Notes (Addendum)
ECHO 08/10/2013 epic Stress Test 10/13/2013 per epic PFT 03/18/2014 epic  Contacted Dr Tobias Alexander with anesthesia in regards to pts heart hx discussed ECHO results per epic 08/10/2013 no orders given anesthesia to see pt day of surgery Heart cath 2014 Pt states is to stop aspirin 5 days prior to surgery did not state what he was to do with plavix. This nurse informed pt to touch base with Dr Charlestine Night office to clarify. Pt verbalized agreement.-

## 2014-12-08 NOTE — Progress Notes (Signed)
Your patient has screened at an elevated risk for Obstructive Sleep Apnea using the Stop-Bang Tool during a pre-surgical vist. A score of 4 or greater is an elevated risk. Score of 6. 

## 2014-12-09 ENCOUNTER — Ambulatory Visit (INDEPENDENT_AMBULATORY_CARE_PROVIDER_SITE_OTHER): Payer: Medicare Other | Admitting: Family Medicine

## 2014-12-09 ENCOUNTER — Encounter: Payer: Self-pay | Admitting: Family Medicine

## 2014-12-09 VITALS — BP 148/78 | HR 93 | Temp 97.8°F | Resp 22 | Ht 69.0 in | Wt 319.0 lb

## 2014-12-09 DIAGNOSIS — I10 Essential (primary) hypertension: Secondary | ICD-10-CM

## 2014-12-09 DIAGNOSIS — M25561 Pain in right knee: Secondary | ICD-10-CM

## 2014-12-09 DIAGNOSIS — E785 Hyperlipidemia, unspecified: Secondary | ICD-10-CM

## 2014-12-09 DIAGNOSIS — E119 Type 2 diabetes mellitus without complications: Secondary | ICD-10-CM | POA: Diagnosis not present

## 2014-12-09 NOTE — Progress Notes (Signed)
Pre visit review using our clinic review tool, if applicable. No additional management support is needed unless otherwise documented below in the visit note. 

## 2014-12-09 NOTE — Progress Notes (Signed)
OFFICE NOTE  12/09/2014  CC:  Chief Complaint  Patient presents with  . Follow-up    HPI: Patient is a 69 y.o. Caucasian male who is here for 3 mo f/u obesity hypoventilation syndrome, HTN, DM 2. Getting right knee arthroscopic surgery for torn meniscus in 1 week. Dr. Radford Pax, his cardilogist, has cleared him for surgery.  Overall, since change-over to different insulins (due to insurance coverage) his sugars are largely unchanged--doing well. No bp monitoring at home to report.  Compliant with meds-lisinopril and cardizem.    No recent changes in baseline DOE, cough, or sputum production.  Wears oxygen 24/7.   Pertinent PMH:  Past medical, surgical, social, and family history reviewed and no changes are noted since last office visit.  MEDS:  Outpatient Prescriptions Prior to Visit  Medication Sig Dispense Refill  . aspirin 81 MG tablet Take 81 mg by mouth daily.      Marland Kitchen atorvastatin (LIPITOR) 80 MG tablet Take 0.5 tablets (40 mg total) by mouth daily at 6 PM. 30 tablet 6  . beta carotene w/minerals (OCUVITE) tablet Take 1 tablet by mouth daily.    . cetirizine (ZYRTEC) 10 MG tablet Take 10 mg by mouth daily.    . citalopram (CELEXA) 20 MG tablet Take 1 tablet (20 mg total) by mouth daily. 30 tablet 6  . clonazePAM (KLONOPIN) 1 MG tablet Take 1 tablet (1 mg total) by mouth 2 (two) times daily as needed for anxiety. 60 tablet 5  . clopidogrel (PLAVIX) 75 MG tablet Take 1 tablet (75 mg total) by mouth daily with breakfast. 30 tablet 3  . clotrimazole-betamethasone (LOTRISONE) cream APPLY   TOPICALLY TO AFFECTED AREA TWICE DAILY 45 g 3  . diltiazem (CARDIZEM CD) 120 MG 24 hr capsule TAKE ONE CAPSULE BY MOUTH ONCE DAILY 30 capsule 5  . gabapentin (NEURONTIN) 300 MG capsule 1 cap qAM, 1 cap mid afternoon, and 2 caps at bedtime (Patient taking differently: Take 300-600 mg by mouth 3 (three) times daily. 1 cap qAM, 1 cap mid afternoon, and 2 caps at bedtime) 120 capsule 6  .  HYDROcodone-acetaminophen (NORCO) 5-325 MG per tablet Take 1-2 tablets by mouth every 6 (six) hours as needed. (Patient taking differently: Take 1-2 tablets by mouth every 6 (six) hours as needed for moderate pain. ) 40 tablet 0  . hydrOXYzine (ATARAX/VISTARIL) 25 MG tablet Take 1 tablet (25 mg total) by mouth every 4 (four) hours as needed for anxiety or itching. 60 tablet 5  . ibuprofen (ADVIL,MOTRIN) 200 MG tablet Take 400 mg by mouth every 6 (six) hours as needed (Pain).    . Insulin Lispro, Human, (HUMALOG KWIKPEN) 200 UNIT/ML SOPN Inject 24 Units into the skin 3 (three) times daily before meals. 15 mL 12  . Insulin NPH, Human,, Isophane, (HUMULIN N KWIKPEN) 100 UNIT/ML Kiwkpen 120 U SQ with BF and 50 U SQ with Supper (Patient taking differently: Inject 50-120 Units into the skin 2 (two) times daily. 120 U SQ with BF and 50 U SQ with Supper) 60 mL 12  . isosorbide mononitrate (IMDUR) 30 MG 24 hr tablet Take 1 tablet (30 mg total) by mouth daily. 30 tablet 3  . lisinopril (PRINIVIL,ZESTRIL) 40 MG tablet TAKE ONE TABLET BY MOUTH ONCE DAILY 90 tablet 1  . Omega-3 Fatty Acids ([employer] FISH OIL) 1000 MG CAPS Take 2 tablets by mouth 2 (two) times daily. 90 capsule   . ONE TOUCH ULTRA TEST test strip TEST 3 TIMES A DAY AS  DIRECTED 100 each 11  . ONETOUCH DELICA LANCETS 29J MISC 1 each by Other route 3 (three) times daily. USE TO TEST 3 TIMES A DAY 100 each 11  . torsemide (DEMADEX) 20 MG tablet TAKE ONE TABLET BY MOUTH ONCE DAILY 90 tablet 4  . zoster vaccine live, PF, (ZOSTAVAX) 24268 UNT/0.65ML injection Inject 19,400 Units into the skin once. 1 vial 0   No facility-administered medications prior to visit.    PE: Blood pressure 148/78, pulse 93, temperature 97.8 F (36.6 C), temperature source Temporal, resp. rate 22, height 5\' 9"  (1.753 m), weight 319 lb (144.697 kg), SpO2 94 %. Gen: Alert, well appearing.  Patient is oriented to person, place, time, and situation. AFFECT: pleasant, lucid thought  and speech. No further exam today.  LABS: none today RECENT:  Lab Results  Component Value Date   HGBA1C 6.5 09/20/2014     Chemistry      Component Value Date/Time   NA 137 12/08/2014 0955   K 4.4 12/08/2014 0955   CL 99 12/08/2014 0955   CO2 30 12/08/2014 0955   BUN 20 12/08/2014 0955   CREATININE 0.92 12/08/2014 0955      Component Value Date/Time   CALCIUM 9.4 12/08/2014 0955   ALKPHOS 60 12/08/2014 0955   AST 28 12/08/2014 0955   ALT 30 12/08/2014 0955   BILITOT 0.5 12/08/2014 0955     Lab Results  Component Value Date   WBC 7.5 12/08/2014   HGB 12.7* 12/08/2014   HCT 41.9 12/08/2014   MCV 89.0 12/08/2014   PLT 194 12/08/2014   Lab Results  Component Value Date   CHOL 150 10/08/2014   HDL 25.30* 10/08/2014   LDLCALC 25 03/18/2014   LDLDIRECT 73.8 10/08/2014   TRIG 316.0* 10/08/2014   CHOLHDL 6 10/08/2014     IMPRESSION AND PLAN:  1) DM 2; stable.  Not due for any monitoring today. Continue current insulin dosing.  2) HTN; The current medical regimen is effective;  continue present plan and medications.  3) Hyperlipidemia; The current medical regimen is effective;  continue present plan and medications. Will plan on repeating lipids in about 6 mo.  4) Right knee pain--has torn meniscus: arthroscopic surgery in 1 wk.  An After Visit Summary was printed and given to the patient.  FOLLOW UP: 73mo (fasting not necessary)

## 2014-12-10 ENCOUNTER — Ambulatory Visit: Payer: Medicare Other | Admitting: Family Medicine

## 2014-12-12 ENCOUNTER — Encounter: Payer: Self-pay | Admitting: Family Medicine

## 2014-12-12 DIAGNOSIS — Z9981 Dependence on supplemental oxygen: Secondary | ICD-10-CM | POA: Diagnosis not present

## 2014-12-12 DIAGNOSIS — I509 Heart failure, unspecified: Secondary | ICD-10-CM | POA: Diagnosis not present

## 2014-12-12 DIAGNOSIS — M6281 Muscle weakness (generalized): Secondary | ICD-10-CM | POA: Diagnosis not present

## 2014-12-12 DIAGNOSIS — J449 Chronic obstructive pulmonary disease, unspecified: Secondary | ICD-10-CM | POA: Diagnosis not present

## 2014-12-15 MED ORDER — DEXTROSE 5 % IV SOLN
3.0000 g | INTRAVENOUS | Status: AC
  Administered 2014-12-16: 3 g via INTRAVENOUS
  Filled 2014-12-15 (×2): qty 3000

## 2014-12-16 ENCOUNTER — Ambulatory Visit (HOSPITAL_COMMUNITY)
Admission: RE | Admit: 2014-12-16 | Discharge: 2014-12-16 | Disposition: A | Discharge status: Home / Self Care | Payer: Medicare Other | Source: Ambulatory Visit | Attending: Orthopedic Surgery | Admitting: Orthopedic Surgery

## 2014-12-16 ENCOUNTER — Encounter (HOSPITAL_COMMUNITY)
Admission: RE | Disposition: A | Discharge status: Home / Self Care | Payer: Self-pay | Source: Ambulatory Visit | Attending: Orthopedic Surgery

## 2014-12-16 ENCOUNTER — Ambulatory Visit (HOSPITAL_COMMUNITY): Payer: Medicare Other | Admitting: Anesthesiology

## 2014-12-16 ENCOUNTER — Encounter (HOSPITAL_COMMUNITY): Payer: Self-pay | Admitting: *Deleted

## 2014-12-16 DIAGNOSIS — Z7982 Long term (current) use of aspirin: Secondary | ICD-10-CM | POA: Diagnosis not present

## 2014-12-16 DIAGNOSIS — J449 Chronic obstructive pulmonary disease, unspecified: Secondary | ICD-10-CM | POA: Insufficient documentation

## 2014-12-16 DIAGNOSIS — E119 Type 2 diabetes mellitus without complications: Secondary | ICD-10-CM | POA: Insufficient documentation

## 2014-12-16 DIAGNOSIS — E785 Hyperlipidemia, unspecified: Secondary | ICD-10-CM | POA: Diagnosis not present

## 2014-12-16 DIAGNOSIS — I10 Essential (primary) hypertension: Secondary | ICD-10-CM | POA: Diagnosis not present

## 2014-12-16 DIAGNOSIS — Z7902 Long term (current) use of antithrombotics/antiplatelets: Secondary | ICD-10-CM | POA: Insufficient documentation

## 2014-12-16 DIAGNOSIS — S83281A Other tear of lateral meniscus, current injury, right knee, initial encounter: Secondary | ICD-10-CM | POA: Insufficient documentation

## 2014-12-16 DIAGNOSIS — M1711 Unilateral primary osteoarthritis, right knee: Secondary | ICD-10-CM | POA: Diagnosis not present

## 2014-12-16 DIAGNOSIS — M23351 Other meniscus derangements, posterior horn of lateral meniscus, right knee: Secondary | ICD-10-CM | POA: Diagnosis not present

## 2014-12-16 DIAGNOSIS — Z6841 Body Mass Index (BMI) 40.0 and over, adult: Secondary | ICD-10-CM | POA: Diagnosis not present

## 2014-12-16 DIAGNOSIS — Z79899 Other long term (current) drug therapy: Secondary | ICD-10-CM | POA: Diagnosis not present

## 2014-12-16 DIAGNOSIS — Z9981 Dependence on supplemental oxygen: Secondary | ICD-10-CM | POA: Diagnosis not present

## 2014-12-16 DIAGNOSIS — I5032 Chronic diastolic (congestive) heart failure: Secondary | ICD-10-CM | POA: Diagnosis not present

## 2014-12-16 DIAGNOSIS — J45909 Unspecified asthma, uncomplicated: Secondary | ICD-10-CM | POA: Insufficient documentation

## 2014-12-16 DIAGNOSIS — Z79891 Long term (current) use of opiate analgesic: Secondary | ICD-10-CM | POA: Insufficient documentation

## 2014-12-16 DIAGNOSIS — I2510 Atherosclerotic heart disease of native coronary artery without angina pectoris: Secondary | ICD-10-CM | POA: Diagnosis not present

## 2014-12-16 DIAGNOSIS — S83241A Other tear of medial meniscus, current injury, right knee, initial encounter: Secondary | ICD-10-CM | POA: Diagnosis not present

## 2014-12-16 DIAGNOSIS — G4733 Obstructive sleep apnea (adult) (pediatric): Secondary | ICD-10-CM | POA: Diagnosis not present

## 2014-12-16 DIAGNOSIS — I252 Old myocardial infarction: Secondary | ICD-10-CM | POA: Diagnosis not present

## 2014-12-16 DIAGNOSIS — Z791 Long term (current) use of non-steroidal anti-inflammatories (NSAID): Secondary | ICD-10-CM | POA: Diagnosis not present

## 2014-12-16 DIAGNOSIS — Z794 Long term (current) use of insulin: Secondary | ICD-10-CM | POA: Diagnosis not present

## 2014-12-16 DIAGNOSIS — M23321 Other meniscus derangements, posterior horn of medial meniscus, right knee: Secondary | ICD-10-CM | POA: Diagnosis not present

## 2014-12-16 DIAGNOSIS — Z87891 Personal history of nicotine dependence: Secondary | ICD-10-CM | POA: Diagnosis not present

## 2014-12-16 DIAGNOSIS — S83282A Other tear of lateral meniscus, current injury, left knee, initial encounter: Secondary | ICD-10-CM | POA: Diagnosis not present

## 2014-12-16 HISTORY — PX: KNEE ARTHROSCOPY WITH MEDIAL MENISECTOMY: SHX5651

## 2014-12-16 LAB — GLUCOSE, CAPILLARY
Glucose-Capillary: 159 mg/dL — ABNORMAL HIGH (ref 70–99)
Glucose-Capillary: 165 mg/dL — ABNORMAL HIGH (ref 70–99)

## 2014-12-16 SURGERY — ARTHROSCOPY, KNEE, WITH MEDIAL MENISCECTOMY
Anesthesia: General | Laterality: Right

## 2014-12-16 MED ORDER — BACITRACIN ZINC 500 UNIT/GM EX OINT
TOPICAL_OINTMENT | CUTANEOUS | Status: DC | PRN
  Administered 2014-12-16: 1 via TOPICAL

## 2014-12-16 MED ORDER — LACTATED RINGERS IR SOLN
Status: DC | PRN
  Administered 2014-12-16: 2

## 2014-12-16 MED ORDER — DEXAMETHASONE SODIUM PHOSPHATE 10 MG/ML IJ SOLN
INTRAMUSCULAR | Status: DC | PRN
  Administered 2014-12-16: 10 mg via INTRAVENOUS

## 2014-12-16 MED ORDER — PROPOFOL 10 MG/ML IV BOLUS
INTRAVENOUS | Status: AC
  Filled 2014-12-16: qty 20

## 2014-12-16 MED ORDER — EPHEDRINE SULFATE 50 MG/ML IJ SOLN
INTRAMUSCULAR | Status: DC | PRN
  Administered 2014-12-16: 25 mg via INTRAVENOUS
  Administered 2014-12-16: 10 mg via INTRAVENOUS
  Administered 2014-12-16: 15 mg via INTRAVENOUS

## 2014-12-16 MED ORDER — OXYCODONE-ACETAMINOPHEN 10-325 MG PO TABS: 1.0000 | ORAL_TABLET | ORAL | Status: DC | PRN

## 2014-12-16 MED ORDER — FENTANYL CITRATE 0.05 MG/ML IJ SOLN: 25.0000 ug | INTRAMUSCULAR | Status: DC | PRN

## 2014-12-16 MED ORDER — FENTANYL CITRATE 0.05 MG/ML IJ SOLN: INTRAMUSCULAR | Status: DC | PRN

## 2014-12-16 MED ORDER — BUPIVACAINE-EPINEPHRINE 0.25% -1:200000 IJ SOLN
INTRAMUSCULAR | Status: DC | PRN
  Administered 2014-12-16: 30 mL

## 2014-12-16 MED ORDER — ONDANSETRON HCL 4 MG/2ML IJ SOLN
INTRAMUSCULAR | Status: AC
  Filled 2014-12-16: qty 2

## 2014-12-16 MED ORDER — DEXAMETHASONE SODIUM PHOSPHATE 10 MG/ML IJ SOLN
INTRAMUSCULAR | Status: AC
  Filled 2014-12-16: qty 1

## 2014-12-16 MED ORDER — ONDANSETRON HCL 4 MG/2ML IJ SOLN
INTRAMUSCULAR | Status: DC | PRN
  Administered 2014-12-16: 4 mg via INTRAVENOUS

## 2014-12-16 MED ORDER — LACTATED RINGERS IV SOLN
INTRAVENOUS | Status: DC
  Administered 2014-12-16 (×2): via INTRAVENOUS

## 2014-12-16 MED ORDER — PROPOFOL 10 MG/ML IV BOLUS
INTRAVENOUS | Status: DC | PRN
  Administered 2014-12-16: 300 mg via INTRAVENOUS
  Administered 2014-12-16: 100 mg via INTRAVENOUS

## 2014-12-16 MED ORDER — BACITRACIN-NEOMYCIN-POLYMYXIN 400-5-5000 EX OINT
TOPICAL_OINTMENT | CUTANEOUS | Status: AC
  Filled 2014-12-16: qty 1

## 2014-12-16 MED ORDER — BUPIVACAINE-EPINEPHRINE (PF) 0.25% -1:200000 IJ SOLN
INTRAMUSCULAR | Status: AC
  Filled 2014-12-16: qty 30

## 2014-12-16 MED ORDER — MIDAZOLAM HCL 2 MG/2ML IJ SOLN
INTRAMUSCULAR | Status: AC
  Filled 2014-12-16: qty 2

## 2014-12-16 MED ORDER — FENTANYL CITRATE 0.05 MG/ML IJ SOLN
INTRAMUSCULAR | Status: AC
  Filled 2014-12-16: qty 5

## 2014-12-16 MED ORDER — LACTATED RINGERS IV SOLN: INTRAVENOUS | Status: DC

## 2014-12-16 MED ORDER — LIDOCAINE HCL (CARDIAC) 20 MG/ML IV SOLN
INTRAVENOUS | Status: DC | PRN
  Administered 2014-12-16: 100 mg via INTRAVENOUS

## 2014-12-16 MED ORDER — MIDAZOLAM HCL 5 MG/5ML IJ SOLN
INTRAMUSCULAR | Status: DC | PRN
  Administered 2014-12-16: 2 mg via INTRAVENOUS

## 2014-12-16 SURGICAL SUPPLY — 32 items
BANDAGE ELASTIC 4 VELCRO ST LF (GAUZE/BANDAGES/DRESSINGS) ×3 IMPLANT
BANDAGE ELASTIC 6 VELCRO ST LF (GAUZE/BANDAGES/DRESSINGS) ×3 IMPLANT
BLADE GREAT WHITE 4.2 (BLADE) IMPLANT
BLADE GREAT WHITE 4.2MM (BLADE)
BLADE SURG SZ11 CARB STEEL (BLADE) IMPLANT
BNDG COHESIVE 6X5 TAN STRL LF (GAUZE/BANDAGES/DRESSINGS) ×3 IMPLANT
CLOTH BEACON ORANGE TIMEOUT ST (SAFETY) IMPLANT
COUNTER NEEDLE 20 DBL MAG RED (NEEDLE) IMPLANT
DRAPE SHEET LG 3/4 BI-LAMINATE (DRAPES) ×3 IMPLANT
DRSG ADAPTIC 3X8 NADH LF (GAUZE/BANDAGES/DRESSINGS) ×3 IMPLANT
DRSG KUZMA FLUFF (GAUZE/BANDAGES/DRESSINGS) ×3 IMPLANT
DRSG PAD ABDOMINAL 8X10 ST (GAUZE/BANDAGES/DRESSINGS) ×6 IMPLANT
DURAPREP 26ML APPLICATOR (WOUND CARE) ×3 IMPLANT
GAUZE SPONGE 4X4 12PLY STRL (GAUZE/BANDAGES/DRESSINGS) ×3 IMPLANT
GLOVE BIOGEL PI IND STRL 8 (GLOVE) ×2 IMPLANT
GLOVE BIOGEL PI INDICATOR 8 (GLOVE) ×4
GLOVE ECLIPSE 8.0 STRL XLNG CF (GLOVE) ×3 IMPLANT
GOWN STRL REUS W/TWL XL LVL3 (GOWN DISPOSABLE) ×3 IMPLANT
KIT BASIN OR (CUSTOM PROCEDURE TRAY) IMPLANT
MANIFOLD NEPTUNE II (INSTRUMENTS) ×3 IMPLANT
PACK ARTHROSCOPY WL (CUSTOM PROCEDURE TRAY) ×3 IMPLANT
PACK ICE MAXI GEL EZY WRAP (MISCELLANEOUS) ×3 IMPLANT
PAD ABD 8X10 STRL (GAUZE/BANDAGES/DRESSINGS) ×3 IMPLANT
PAD MASON LEG HOLDER (PIN) ×3 IMPLANT
SET ARTHROSCOPY TUBING (MISCELLANEOUS) ×2
SET ARTHROSCOPY TUBING LN (MISCELLANEOUS) ×1 IMPLANT
SUT ETHILON 3 0 PS 1 (SUTURE) ×3 IMPLANT
TOWEL OR 17X26 10 PK STRL BLUE (TOWEL DISPOSABLE) ×3 IMPLANT
TUBING CONNECTING 10 (TUBING) IMPLANT
TUBING CONNECTING 10' (TUBING)
WAND 90 DEG TURBOVAC W/CORD (SURGICAL WAND) ×3 IMPLANT
WRAP KNEE MAXI GEL POST OP (GAUZE/BANDAGES/DRESSINGS) ×3 IMPLANT

## 2014-12-16 NOTE — Brief Op Note (Signed)
12/16/2014  1:51 PM  PATIENT:  [name]  69 y.o. male  PRE-OPERATIVE DIAGNOSIS:  RIGHT KNEE TORN MEDIAL MENISCUS torn lateral meniscus primary osteoarthritis right knee  POST-OPERATIVE DIAGNOSIS:  RIGHT KNEE TORN MEDIAL MENISCUS torn lateral meniscus primary osteoathritis right knee  PROCEDURE:  Procedure(s): RIGHT KNEE ARTHROSCOPY WITH MEDIAL MENISECTOMY microfracture medial femoral condyle abrasion condroplasty medial femoral condyle lateral menisectomy (Right)  SURGEON:  Surgeon(s) and Role:    * Tobi Bastos, MD - Primary     ASSISTANTS: OR Tech  ANESTHESIA:   general  EBL:  Total I/O In: 1500 [I.V.:1500] Out: -   BLOOD ADMINISTERED:none  DRAINS: none   LOCAL MEDICATIONS USED:  MARCAINE 30cc of 0.25% with Epinephrine    SPECIMEN:  No Specimen  DISPOSITION OF SPECIMEN:  N/A  COUNTS:  YES  TOURNIQUET:  * No tourniquets in log *  DICTATION: .Other Dictation: Dictation Number [id number]  PLAN OF CARE: Discharge to home after PACU  PATIENT DISPOSITION:  PACU - hemodynamically stable.   Delay start of Pharmacological VTE agent (>24hrs) due to surgical blood loss or risk of bleeding: yes

## 2014-12-16 NOTE — Anesthesia Preprocedure Evaluation (Addendum)
Anesthesia Evaluation  Patient identified by MRN, date of birth, ID band Patient awake    Reviewed: Allergy & Precautions, H&P , NPO status , Patient's Chart, lab work & pertinent test results  Airway Mallampati: III  TM Distance: >3 FB Neck ROM: full    Dental  (+) Edentulous Upper, Edentulous Lower, Dental Advisory Given   Pulmonary shortness of breath and with exertion, asthma , sleep apnea , COPD oxygen dependent, former smoker,  breath sounds clear to auscultation  Pulmonary exam normal       Cardiovascular Exercise Tolerance: Good hypertension, Pt. on medications + CAD, + Past MI, +CHF and + DOE negative cardio ROS  Rhythm:regular Rate:Normal  Chronic diastolic heart failure.  Medically managed CAD. RBBB   Neuro/Psych negative neurological ROS  negative psych ROS   GI/Hepatic negative GI ROS, Neg liver ROS,   Endo/Other  diabetes, Well Controlled, Type 2, Oral Hypoglycemic Agents, Insulin DependentMorbid obesity  Renal/GU negative Renal ROS  negative genitourinary   Musculoskeletal   Abdominal (+) + obese,   Peds  Hematology negative hematology ROS (+)   Anesthesia Other Findings   Reproductive/Obstetrics negative OB ROS                           Anesthesia Physical Anesthesia Plan  ASA: IV  Anesthesia Plan: General   Post-op Pain Management:    Induction: Intravenous  Airway Management Planned: LMA  Additional Equipment:   Intra-op Plan:   Post-operative Plan:   Informed Consent: I have reviewed the patients History and Physical, chart, labs and discussed the procedure including the risks, benefits and alternatives for the proposed anesthesia with the patient or authorized representative who has indicated his/her understanding and acceptance.   Dental Advisory Given  Plan Discussed with: CRNA and Surgeon  Anesthesia Plan Comments:         Anesthesia Quick  Evaluation

## 2014-12-16 NOTE — H&P (Signed)
[name] is an 69 y.o. male.   Chief Complaint: Right knee pain and swelling. HPI: Patient developed pain and swelling and Catching in his Right Knee  Past Medical History  Diagnosis Date  . DIABETES MELLITUS, TYPE II 06/24/2007  . HYPERLIPIDEMIA 06/24/2007  . HYPERTENSION 05/01/2007  . Hypoxemia 01/27/2010  . OBESITY 09/02/2008  . OSTEOARTHRITIS 05/01/2007  . Chronic diastolic CHF (congestive heart failure) 02/25/2009  . Asthma     as a child  . Cataract   . History of kidney stones   . Pruritic condition 05/2014    Allergist summer 2015, no new testing.  . Dermatitis 05/2014  . Coronary artery disease     chronically occluded LAD and diagonal with right to left collaterals, mild disease in the left circ and moderate disease in the mid RCA on medical management with Imdur, ASA, and Plavix.  . Myocardial infarction     pt states he was informed per MD that he has had one but pt was unaware   . COPD (chronic obstructive pulmonary disease)   . Obesity hypoventilation syndrome     oxygen 24/7  . 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 OSA (obstructive sleep apnea)     not tested; pt scored 4 per stop bang tool results sent to PCP     Past Surgical History  Procedure Laterality Date  . Appendectomy    . Wrist surgery      right fx  . Shoulder surgery      right fx  . Back surgery  2001  . Carpal tunnel release Left   . Cataract extraction w/phaco Right 04/29/2013    Procedure: CATARACT EXTRACTION PHACO AND INTRAOCULAR LENS PLACEMENT (IOC);  Surgeon: Adonis Brook, MD;  Location: [facility];  Service: Ophthalmology;  Laterality: Right;  . Colonoscopy w/ polypectomy  08/2011    Many polyps--all hyperplastic, severe diverticulosis, int hem  . Left heart catheterization with coronary angiogram N/A 10/26/2013    Procedure: LEFT HEART CATHETERIZATION WITH CORONARY ANGIOGRAM;  Surgeon: Jettie Booze, MD;  Location: [facility] CATH  LAB;  Service: Cardiovascular;  Laterality: N/A;  . Tonsillectomy    . Cardiac catheterization      Family History  Problem Relation Age of Onset  . Aneurysm Mother   . Alcohol abuse Father   . Colon cancer Neg Hx    Social History:  reports that he quit smoking about 31 years ago. His smoking use included Cigarettes, Pipe, and Cigars. He has a 45 pack-year smoking history. His smokeless tobacco use includes Chew. He reports that he does not drink alcohol or use illicit drugs.  Allergies: No Known Allergies  Medications Prior to Admission  Medication Sig Dispense Refill  . aspirin 81 MG tablet Take 81 mg by mouth daily.      Marland Kitchen atorvastatin (LIPITOR) 80 MG tablet Take 0.5 tablets (40 mg total) by mouth daily at 6 PM. 30 tablet 6  . beta carotene w/minerals (OCUVITE) tablet Take 1 tablet by mouth daily.    . cetirizine (ZYRTEC) 10 MG tablet Take 10 mg by mouth daily.    . citalopram (CELEXA) 20 MG tablet Take 1 tablet (20 mg total) by mouth daily. 30 tablet 6  . clonazePAM (KLONOPIN) 1 MG tablet Take 1 tablet (1 mg total) by mouth 2 (two) times daily as needed for anxiety. 60 tablet 5  . clopidogrel (PLAVIX) 75  MG tablet Take 1 tablet (75 mg total) by mouth daily with breakfast. 30 tablet 3  . clotrimazole-betamethasone (LOTRISONE) cream APPLY   TOPICALLY TO AFFECTED AREA TWICE DAILY 45 g 3  . diltiazem (CARDIZEM CD) 120 MG 24 hr capsule TAKE ONE CAPSULE BY MOUTH ONCE DAILY 30 capsule 5  . gabapentin (NEURONTIN) 300 MG capsule 1 cap qAM, 1 cap mid afternoon, and 2 caps at bedtime (Patient taking differently: Take 300-600 mg by mouth 3 (three) times daily. 1 cap qAM, 1 cap mid afternoon, and 2 caps at bedtime) 120 capsule 6  . HYDROcodone-acetaminophen (NORCO) 5-325 MG per tablet Take 1-2 tablets by mouth every 6 (six) hours as needed. (Patient taking differently: Take 1-2 tablets by mouth every 6 (six) hours as needed for moderate pain. ) 40 tablet 0  . hydrOXYzine (ATARAX/VISTARIL) 25 MG  tablet Take 1 tablet (25 mg total) by mouth every 4 (four) hours as needed for anxiety or itching. 60 tablet 5  . ibuprofen (ADVIL,MOTRIN) 200 MG tablet Take 400 mg by mouth every 6 (six) hours as needed (Pain).    . Insulin Lispro, Human, (HUMALOG KWIKPEN) 200 UNIT/ML SOPN Inject 24 Units into the skin 3 (three) times daily before meals. 15 mL 12  . Insulin NPH, Human,, Isophane, (HUMULIN N KWIKPEN) 100 UNIT/ML Kiwkpen 120 U SQ with BF and 50 U SQ with Supper (Patient taking differently: Inject 50-120 Units into the skin 2 (two) times daily. 120 U SQ with BF and 50 U SQ with Supper) 60 mL 12  . isosorbide mononitrate (IMDUR) 30 MG 24 hr tablet Take 1 tablet (30 mg total) by mouth daily. 30 tablet 3  . lisinopril (PRINIVIL,ZESTRIL) 40 MG tablet TAKE ONE TABLET BY MOUTH ONCE DAILY 90 tablet 1  . Omega-3 Fatty Acids ([employer] FISH OIL) 1000 MG CAPS Take 2 tablets by mouth 2 (two) times daily. 90 capsule   . torsemide (DEMADEX) 20 MG tablet TAKE ONE TABLET BY MOUTH ONCE DAILY 90 tablet 4  . ONE TOUCH ULTRA TEST test strip TEST 3 TIMES A DAY AS DIRECTED 100 each 11  . ONETOUCH DELICA LANCETS 76B MISC 1 each by Other route 3 (three) times daily. USE TO TEST 3 TIMES A DAY 100 each 11  . zoster vaccine live, PF, (ZOSTAVAX) 34193 UNT/0.65ML injection Inject 19,400 Units into the skin once. 1 vial 0    Results for orders placed or performed during the hospital encounter of 12/16/14 (from the past 48 hour(s))  Glucose, capillary     Status: Abnormal   Collection Time: 12/16/14 10:28 AM  Result Value Ref Range   Glucose-Capillary 159 (H) 70 - 99 mg/dL   No results found.  Review of Systems  Constitutional: Negative.   HENT: Negative.   Eyes: Negative.   Respiratory: Negative.   Cardiovascular: Negative.   Gastrointestinal: Negative.   Genitourinary: Negative.   Musculoskeletal: Positive for joint pain.  Skin: Negative.   Neurological: Negative.   Endo/Heme/Allergies: Negative.    Psychiatric/Behavioral: Negative.     Blood pressure 136/60, pulse 78, temperature 97.6 F (36.4 C), temperature source Oral, resp. rate 20, height 5\' 9"  (1.753 m), weight 144.697 kg (319 lb), SpO2 93 %. Physical Exam  Constitutional: He appears well-developed.  HENT:  Head: Normocephalic.  Eyes: Pupils are equal, round, and reactive to light.  Neck: Normal range of motion.  Cardiovascular: Normal rate.   Respiratory: Effort normal.  GI: Soft.  Musculoskeletal:  Painful,limited motion of his Right Knee.  Neurological:  He is alert.  Skin: Skin is warm.     Assessment/Plan Arthroscopic Medial Meniscectomy of Right Knee.  Girl Schissler A 12/16/2014, 12:35 PM

## 2014-12-16 NOTE — Anesthesia Postprocedure Evaluation (Signed)
  Anesthesia Post-op Note  Patient: [name]  Procedure(s) Performed: Procedure(s) (LRB): RIGHT KNEE ARTHROSCOPY WITH MEDIAL MENISECTOMY microfracture medial femoral condyle abrasion condroplasty medial femoral condyle lateral menisectomy (Right)  Patient Location: PACU  Anesthesia Type: General  Level of Consciousness: awake and alert   Airway and Oxygen Therapy: Patient Spontanous Breathing  Post-op Pain: mild  Post-op Assessment: Post-op Vital signs reviewed, Patient's Cardiovascular Status Stable, Respiratory Function Stable, Patent Airway and No signs of Nausea or vomiting  Last Vitals:  Filed Vitals:   12/16/14 1454  BP: 142/67  Pulse: 85  Temp:   Resp: 17    Post-op Vital Signs: stable   Complications: No apparent anesthesia complications

## 2014-12-16 NOTE — Transfer of Care (Signed)
Immediate Anesthesia Transfer of Care Note  Patient: [name]  Procedure(s) Performed: Procedure(s): RIGHT KNEE ARTHROSCOPY WITH MEDIAL MENISECTOMY microfracture medial femoral condyle abrasion condroplasty medial femoral condyle lateral menisectomy (Right)  Patient Location: PACU  Anesthesia Type:General  Level of Consciousness: sedated  Airway & Oxygen Therapy: Patient Spontanous Breathing and Patient connected to face mask oxygen  Post-op Assessment: Report given to RN and Post -op Vital signs reviewed and stable  Post vital signs: Reviewed and stable  Last Vitals:  Filed Vitals:   12/16/14 1025  BP: 136/60  Pulse: 78  Temp: 36.4 C  Resp: 20    Complications: No apparent anesthesia complications

## 2014-12-16 NOTE — Interval H&P Note (Signed)
History and Physical Interval Note:  12/16/2014 12:39 PM  [name]  has presented today for surgery, with the diagnosis of [place]  The various methods of treatment have been discussed with the patient and family. After consideration of risks, benefits and other options for treatment, the patient has consented to  Procedure(s): RIGHT KNEE ARTHROSCOPY WITH MEDIAL MENISECTOMY (Right) as a surgical intervention .  The patient's history has been reviewed, patient examined, no change in status, stable for surgery.  I have reviewed the patient's chart and labs.  Questions were answered to the patient's satisfaction.     Citlalli Weikel A

## 2014-12-17 ENCOUNTER — Encounter (HOSPITAL_COMMUNITY): Payer: Self-pay | Admitting: Orthopedic Surgery

## 2014-12-17 NOTE — Op Note (Signed)
NAMEERYC, BODEY NO.:  [id number]  MEDICAL RECORD NO.:  [id number]  LOCATION:  [facility]                         FACILITY:  [facility]  PHYSICIAN:  Kipp Brood. Maelyn Berrey, M.D.DATE OF BIRTH:  Jan 28, 1946  DATE OF PROCEDURE:  12/16/2014 DATE OF DISCHARGE:  12/16/2014                              OPERATIVE REPORT   SURGEON:  Jori Moll A. Shiori Adcox, M.D.  ASSISTANT:  The OR tech and the OR nurse.  PREOPERATIVE DIAGNOSES: 1. Primary osteoarthritis of the right knee. 2. Partial tear of the medial meniscus, right knee. 3. Partial tear of the lateral meniscus, right knee.  POSTOPERATIVE DIAGNOSES: 1. Primary osteoarthritis of the right knee. 2. Partial tear of the medial meniscus, right knee. 3. Partial tear of the lateral meniscus, right knee.  OPERATION: 1. Diagnostic arthroscopy, right knee. 2. Medial meniscectomy, right knee. 3. Lateral meniscectomy, right knee. 4. Abrasion chondroplasty to medial femoral condyle, right knee. 5. Microfracture technique to medial femoral condyle, right knee.  PROCEDURE:  Under general anesthesia, routine orthopedic prep and draping of the right lower extremity was carried out.  The right leg was placed in a leg holder.  The patient had 2 g of IV Ancef.  At this time, the appropriate time-out was carried out.  I also marked the appropriate right leg in the holding area.  A small punctate incision was made in the suprapatellar pouch, inflow cannula was inserted, knee was distended with saline.  Another small punctate incision was made in the anterolateral joint.  The arthroscope was entered from lateral approach after the knee was distended.  We then did a complete diagnostic arthroscopy.  The major findings were as follows: 1. He had severe primary osteoarthritis of the right knee. 2. He had tear of the medial meniscus, right knee. I introduced the ArthroCare from the medial approach, did a partial medial meniscectomy.  I also did an  abrasion chondroplasty, not down the bleeding bone of the medial femoral condyle, but I did a microfracture technique to follow them to bleeding bone on medial femoral condyle.  I went across the knee, the cruciates were intact.  The lateral joint was also arthritic and he also had a complex tear of the lateral meniscus and I did a partial lateral meniscectomy.  I thoroughly irrigated out the knee, there were no other abnormalities noted.  The wound was closed with 3-0 nylon suture.  I injected 30 mL of 0.25% Marcaine with epinephrine in the knee joint.  Following the procedure, sterile Neosporin bundle dressing was applied.  The patient left the operating room in a satisfactory condition.  PLAN: 1. Postoperatively, he will be on aspirin 325 mg b.i.d. starting today     and b.i.d. for 2 weeks as an anticoagulant. 2. He will be on Percocet 10/325 for pain and he will be on walker,     partial weightbearing.  I will see him in the office in 10-12 days     or prior to if there is a problem.          ______________________________ Kipp Brood Gladstone Lighter, M.D.     RAG/MEDQ  D:  12/16/2014  T:  12/17/2014  Job:  [id number]  cc:   Kipp Brood. Gladstone Lighter, M.D. Fax: [phone]

## 2014-12-24 ENCOUNTER — Telehealth: Payer: Self-pay

## 2014-12-24 NOTE — Telephone Encounter (Signed)
Received "walk-in form" from yesterday regarding patient being upset about not having a lab appointment yesterday.  Called the patient, and reiterated to him that his lab appointment was scheduled for April 25, not Feb. 25. Patient is obviously annoyed, but explained that it is documented and the appointment was made at the time we spoke.  Verified fasting appointment for April 25. Apologized for any inconvenience.

## 2015-01-06 DIAGNOSIS — J449 Chronic obstructive pulmonary disease, unspecified: Secondary | ICD-10-CM | POA: Diagnosis not present

## 2015-01-07 ENCOUNTER — Other Ambulatory Visit: Payer: Self-pay | Admitting: Family Medicine

## 2015-01-10 DIAGNOSIS — Z9981 Dependence on supplemental oxygen: Secondary | ICD-10-CM | POA: Diagnosis not present

## 2015-01-10 DIAGNOSIS — I509 Heart failure, unspecified: Secondary | ICD-10-CM | POA: Diagnosis not present

## 2015-01-10 DIAGNOSIS — J449 Chronic obstructive pulmonary disease, unspecified: Secondary | ICD-10-CM | POA: Diagnosis not present

## 2015-01-10 DIAGNOSIS — M6281 Muscle weakness (generalized): Secondary | ICD-10-CM | POA: Diagnosis not present

## 2015-01-10 NOTE — Telephone Encounter (Signed)
Refill request for clonazepam Last filled by MD on- 06/14/14 #60 x5  Refill request for citalopram Last filled by MD on- 06/14/14 #30 x6  Last Appt: 12/09/2014 Next Appt: 03/08/2015 Please advise refills?

## 2015-01-10 NOTE — Telephone Encounter (Signed)
Clonazepam and citalopram RF'd.

## 2015-01-25 DIAGNOSIS — Z4789 Encounter for other orthopedic aftercare: Secondary | ICD-10-CM | POA: Diagnosis not present

## 2015-01-29 ENCOUNTER — Other Ambulatory Visit: Payer: Self-pay | Admitting: Cardiology

## 2015-02-06 DIAGNOSIS — J449 Chronic obstructive pulmonary disease, unspecified: Secondary | ICD-10-CM | POA: Diagnosis not present

## 2015-02-10 DIAGNOSIS — Z9981 Dependence on supplemental oxygen: Secondary | ICD-10-CM | POA: Diagnosis not present

## 2015-02-10 DIAGNOSIS — I509 Heart failure, unspecified: Secondary | ICD-10-CM | POA: Diagnosis not present

## 2015-02-10 DIAGNOSIS — M6281 Muscle weakness (generalized): Secondary | ICD-10-CM | POA: Diagnosis not present

## 2015-02-10 DIAGNOSIS — J449 Chronic obstructive pulmonary disease, unspecified: Secondary | ICD-10-CM | POA: Diagnosis not present

## 2015-02-21 ENCOUNTER — Other Ambulatory Visit (INDEPENDENT_AMBULATORY_CARE_PROVIDER_SITE_OTHER): Payer: Medicare Other | Admitting: *Deleted

## 2015-02-21 DIAGNOSIS — E785 Hyperlipidemia, unspecified: Secondary | ICD-10-CM | POA: Diagnosis not present

## 2015-02-21 LAB — HEPATIC FUNCTION PANEL
ALBUMIN: 4.2 g/dL (ref 3.5–5.2)
ALT: 21 U/L (ref 0–53)
AST: 20 U/L (ref 0–37)
Alkaline Phosphatase: 59 U/L (ref 39–117)
BILIRUBIN DIRECT: 0.1 mg/dL (ref 0.0–0.3)
Total Bilirubin: 0.4 mg/dL (ref 0.2–1.2)
Total Protein: 7.7 g/dL (ref 6.0–8.3)

## 2015-02-21 LAB — LIPID PANEL
CHOLESTEROL: 183 mg/dL (ref 0–200)
HDL: 33 mg/dL — ABNORMAL LOW (ref >39.00)
NONHDL: 150
Total CHOL/HDL Ratio: 6
Triglycerides: 369 mg/dL — ABNORMAL HIGH (ref 0.0–149.0)
VLDL: 73.8 mg/dL — AB (ref 0.0–40.0)

## 2015-02-21 LAB — LDL CHOLESTEROL, DIRECT: Direct LDL: 92 mg/dL

## 2015-02-22 ENCOUNTER — Telehealth: Payer: Self-pay | Admitting: *Deleted

## 2015-02-22 MED ORDER — ATORVASTATIN CALCIUM 80 MG PO TABS: 80.0000 mg | ORAL_TABLET | Freq: Every day | ORAL | Status: DC

## 2015-02-22 NOTE — Telephone Encounter (Signed)
Patient notified of lab results and notations/advisement from Dr. Radford Pax to increase Lipitor from 40 mg daily to 80 mg daily. Patient stated he would do that and start that today. He has return appt with Dr. Radford Pax on May 17th. He will need to come back in early June for lab work to check status of FLP and ALT. Patient verbalized understanding and agreement to continue with current treatment plan.

## 2015-03-08 ENCOUNTER — Ambulatory Visit (INDEPENDENT_AMBULATORY_CARE_PROVIDER_SITE_OTHER): Payer: Medicare Other | Admitting: Family Medicine

## 2015-03-08 ENCOUNTER — Encounter: Payer: Self-pay | Admitting: Family Medicine

## 2015-03-08 VITALS — BP 94/60 | HR 87 | Temp 97.6°F | Resp 16 | Wt 323.0 lb

## 2015-03-08 DIAGNOSIS — E119 Type 2 diabetes mellitus without complications: Secondary | ICD-10-CM | POA: Diagnosis not present

## 2015-03-08 DIAGNOSIS — J449 Chronic obstructive pulmonary disease, unspecified: Secondary | ICD-10-CM | POA: Diagnosis not present

## 2015-03-08 DIAGNOSIS — M25561 Pain in right knee: Secondary | ICD-10-CM

## 2015-03-08 DIAGNOSIS — I10 Essential (primary) hypertension: Secondary | ICD-10-CM | POA: Diagnosis not present

## 2015-03-08 LAB — BASIC METABOLIC PANEL
BUN: 33 mg/dL — ABNORMAL HIGH (ref 6–23)
CHLORIDE: 100 meq/L (ref 96–112)
CO2: 27 meq/L (ref 19–32)
CREATININE: 1.18 mg/dL (ref 0.40–1.50)
Calcium: 9.6 mg/dL (ref 8.4–10.5)
GFR: 64.92 mL/min (ref >60.00)
Glucose, Bld: 167 mg/dL — ABNORMAL HIGH (ref 70–99)
POTASSIUM: 4.9 meq/L (ref 3.5–5.1)
Sodium: 134 mEq/L — ABNORMAL LOW (ref 135–145)

## 2015-03-08 LAB — MICROALBUMIN / CREATININE URINE RATIO
CREATININE, U: 195.3 mg/dL
MICROALB/CREAT RATIO: 0.7 mg/g (ref 0.0–30.0)
Microalb, Ur: 1.3 mg/dL (ref 0.0–1.9)

## 2015-03-08 LAB — HEMOGLOBIN A1C: Hgb A1c MFr Bld: 6.9 % — ABNORMAL HIGH (ref 4.6–6.5)

## 2015-03-08 NOTE — Progress Notes (Signed)
OFFICE NOTE  03/16/2015  CC:  Chief Complaint  Patient presents with  . Follow-up     HPI: Patient is a 69 y.o. Caucasian male who is here for 3 mo f/u DM 2, HTN, f/u recent knee arthroscopic surgery 11/2014. Compliant with insulin regimen: NPH 120 qAM, 50 U qPM.  Also humalog 24 U qAC.  Fastings: no hypoglycemia, anywhere from low 100s occ to upper 100s to low 200s usually. Evenings usually 160s-180s, but again, no real clear pattern.  No bp monitoring from home. Compliant with meds. Takes torsemide qAM.  His cardiologist put him on fish oil caps, sounds like brief decrease of atorv to 40mg  qd and then put him back up on 80 mg qd.  Knee is improved since surgery, says he has not required any narcotic pain med in a couple months.  It is still unstable an he wears a soft knee brace support and uses a rolling walker with a seat to get around still.    Pertinent PMH:  Past medical, surgical, social, and family history reviewed and no changes are noted since last office visit.  MEDS:  Outpatient Prescriptions Prior to Visit  Medication Sig Dispense Refill  . aspirin 81 MG tablet Take 81 mg by mouth daily.      Marland Kitchen atorvastatin (LIPITOR) 80 MG tablet Take 1 tablet (80 mg total) by mouth daily. At 6pm. 90 tablet 3  . beta carotene w/minerals (OCUVITE) tablet Take 1 tablet by mouth daily.    . cetirizine (ZYRTEC) 10 MG tablet Take 10 mg by mouth daily.    . citalopram (CELEXA) 20 MG tablet TAKE ONE TABLET BY MOUTH ONCE DAILY 30 tablet 11  . clonazePAM (KLONOPIN) 1 MG tablet TAKE ONE TABLET BY MOUTH TWICE DAILY AS NEEDED FOR ANXIETY 60 tablet 5  . clopidogrel (PLAVIX) 75 MG tablet Take 1 tablet (75 mg total) by mouth daily with breakfast. 30 tablet 3  . clotrimazole-betamethasone (LOTRISONE) cream APPLY   TOPICALLY TO AFFECTED AREA TWICE DAILY 45 g 3  . diltiazem (CARDIZEM CD) 120 MG 24 hr capsule TAKE ONE CAPSULE BY MOUTH ONCE DAILY 30 capsule 5  . gabapentin (NEURONTIN) 300 MG capsule  1 cap qAM, 1 cap mid afternoon, and 2 caps at bedtime (Patient taking differently: Take 300-600 mg by mouth 3 (three) times daily. 1 cap qAM, 1 cap mid afternoon, and 2 caps at bedtime) 120 capsule 6  . hydrOXYzine (ATARAX/VISTARIL) 25 MG tablet Take 1 tablet (25 mg total) by mouth every 4 (four) hours as needed for anxiety or itching. 60 tablet 5  . Insulin Lispro, Human, (HUMALOG KWIKPEN) 200 UNIT/ML SOPN Inject 24 Units into the skin 3 (three) times daily before meals. 15 mL 12  . Insulin NPH, Human,, Isophane, (HUMULIN N KWIKPEN) 100 UNIT/ML Kiwkpen 120 U SQ with BF and 50 U SQ with Supper (Patient taking differently: Inject 50-120 Units into the skin 2 (two) times daily. 120 U SQ with BF and 50 U SQ with Supper) 60 mL 12  . isosorbide mononitrate (IMDUR) 30 MG 24 hr tablet Take 1 tablet (30 mg total) by mouth daily. 30 tablet 3  . lisinopril (PRINIVIL,ZESTRIL) 40 MG tablet TAKE ONE TABLET BY MOUTH ONCE DAILY 90 tablet 1  . Omega-3 Fatty Acids ([employer] FISH OIL) 1000 MG CAPS Take 2 tablets by mouth 2 (two) times daily. 90 capsule   . ONE TOUCH ULTRA TEST test strip TEST 3 TIMES A DAY AS DIRECTED 100 each 11  .  ONETOUCH DELICA LANCETS 00F MISC 1 each by Other route 3 (three) times daily. USE TO TEST 3 TIMES A DAY 100 each 11  . oxyCODONE-acetaminophen (PERCOCET) 10-325 MG per tablet Take 1 tablet by mouth every 4 (four) hours as needed for pain. 40 tablet 0  . torsemide (DEMADEX) 20 MG tablet TAKE ONE TABLET BY MOUTH ONCE DAILY 90 tablet 4  . zoster vaccine live, PF, (ZOSTAVAX) 11021 UNT/0.65ML injection Inject 19,400 Units into the skin once. 1 vial 0  . diltiazem (CARDIZEM CD) 120 MG 24 hr capsule TAKE ONE CAPSULE BY MOUTH ONCE DAILY 30 capsule 2   No facility-administered medications prior to visit.    PE: Blood pressure 94/60, pulse 87, temperature 97.6 F (36.4 C), temperature source Temporal, resp. rate 16, weight 323 lb (146.512 kg), SpO2 94 %. 2L oxygen nasal cannulae (this is his  usual/baseline oxygen requirment in daytime). Gen: Alert, well appearing.  Patient is oriented to person, place, time, and situation. No further exam today.  LABS:  Lab Results  Component Value Date   HGBA1C 6.9* 03/08/2015   Lab Results  Component Value Date   WBC 7.5 12/08/2014   HGB 12.7* 12/08/2014   HCT 41.9 12/08/2014   MCV 89.0 12/08/2014   PLT 194 12/08/2014     Chemistry      Component Value Date/Time   NA 135 03/15/2015 0920   K 4.6 03/15/2015 0920   CL 99 03/15/2015 0920   CO2 30 03/15/2015 0920   BUN 26* 03/15/2015 0920   CREATININE 1.09 03/15/2015 0920      Component Value Date/Time   CALCIUM 9.9 03/15/2015 0920   ALKPHOS 59 02/21/2015 0820   AST 20 02/21/2015 0820   ALT 21 02/21/2015 0820   BILITOT 0.4 02/21/2015 0820     Lab Results  Component Value Date   CHOL 183 02/21/2015   HDL 33.00* 02/21/2015   LDLCALC 25 03/18/2014   LDLDIRECT 92.0 02/21/2015   TRIG 369.0* 02/21/2015   CHOLHDL 6 02/21/2015   IMPRESSION AND PLAN:  1) DM 2; Check HbA1c, urine microalb/cr today. Feet exam at next routine f/u.  2) HTN; The current medical regimen is effective;  continue present plan and medications. BMET today.  3) R knee pain: MUCH improved s/p surgery!  No pain pills needed at this time.  An After Visit Summary was printed and given to the patient.  FOLLOW UP: 4 mo

## 2015-03-08 NOTE — Progress Notes (Signed)
Pre visit review using our clinic review tool, if applicable. No additional management support is needed unless otherwise documented below in the visit note. 

## 2015-03-12 DIAGNOSIS — M6281 Muscle weakness (generalized): Secondary | ICD-10-CM | POA: Diagnosis not present

## 2015-03-12 DIAGNOSIS — J449 Chronic obstructive pulmonary disease, unspecified: Secondary | ICD-10-CM | POA: Diagnosis not present

## 2015-03-12 DIAGNOSIS — Z9981 Dependence on supplemental oxygen: Secondary | ICD-10-CM | POA: Diagnosis not present

## 2015-03-12 DIAGNOSIS — I509 Heart failure, unspecified: Secondary | ICD-10-CM | POA: Diagnosis not present

## 2015-03-15 ENCOUNTER — Ambulatory Visit (INDEPENDENT_AMBULATORY_CARE_PROVIDER_SITE_OTHER): Payer: Medicare Other | Admitting: Cardiology

## 2015-03-15 ENCOUNTER — Encounter: Payer: Self-pay | Admitting: Cardiology

## 2015-03-15 VITALS — BP 100/52 | HR 85 | Ht 69.0 in | Wt 321.1 lb

## 2015-03-15 DIAGNOSIS — I10 Essential (primary) hypertension: Secondary | ICD-10-CM

## 2015-03-15 DIAGNOSIS — E785 Hyperlipidemia, unspecified: Secondary | ICD-10-CM | POA: Diagnosis not present

## 2015-03-15 DIAGNOSIS — I5032 Chronic diastolic (congestive) heart failure: Secondary | ICD-10-CM | POA: Diagnosis not present

## 2015-03-15 DIAGNOSIS — I2510 Atherosclerotic heart disease of native coronary artery without angina pectoris: Secondary | ICD-10-CM

## 2015-03-15 LAB — BASIC METABOLIC PANEL
BUN: 26 mg/dL — ABNORMAL HIGH (ref 6–23)
CO2: 30 meq/L (ref 19–32)
CREATININE: 1.09 mg/dL (ref 0.40–1.50)
Calcium: 9.9 mg/dL (ref 8.4–10.5)
Chloride: 99 mEq/L (ref 96–112)
GFR: 71.14 mL/min (ref >60.00)
Glucose, Bld: 121 mg/dL — ABNORMAL HIGH (ref 70–99)
Potassium: 4.6 mEq/L (ref 3.5–5.1)
Sodium: 135 mEq/L (ref 135–145)

## 2015-03-15 NOTE — Progress Notes (Signed)
Cardiology Office Note   Date:  03/15/2015   ID:  JOSHAU CODE, DOB 30-Sep-1946, MRN [id number]  PCP:  Tammi Sou, MD    Chief Complaint  Patient presents with  . Follow-up    CHF      History of Present Illness: [name] is a 69 y.o. male with a history of obesity hypoventilation syndrome on home O2, HTN, dyslipidemia, morbid obesity, chronic LE edema which is controlled with TED hose stockings and diuretic. He has chronic diastolic CHF controlled on diuretics. He has ASCAD with severely diseased and chronically occluded LAD and diagonal with right to left collaterals, mild disease in the left circ and moderate disease in the mid RCA on medical management with Imdur and Plavix. He presents back today for followup. He is doing well. He has chronic SOB related to his underlying obesity hypoventilation and is on home O2 and he thinks his DOE is stable. He denies any chest pain, dizziness, palpitations or syncope. It was felt in the past that he may have OSA but has refused sleep study.      Past Medical History  Diagnosis Date  . DIABETES MELLITUS, TYPE II 06/24/2007  . HYPERLIPIDEMIA 06/24/2007  . HYPERTENSION 05/01/2007  . Hypoxemia 01/27/2010  . OBESITY 09/02/2008  . OSTEOARTHRITIS 05/01/2007  . Chronic diastolic CHF (congestive heart failure) 02/25/2009  . Asthma     as a child  . Cataract   . History of kidney stones   . Pruritic condition 05/2014    Allergist summer 2015, no new testing.  . Dermatitis 05/2014  . Coronary artery disease     chronically occluded LAD and diagonal with right to left collaterals, mild disease in the left circ and moderate disease in the mid RCA on medical management with Imdur, ASA, and Plavix.  . Myocardial infarction     pt states he was informed per MD that he has had one but pt was unaware   . COPD (chronic obstructive pulmonary disease)   . Obesity hypoventilation syndrome     oxygen 24/7  . 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 OSA (obstructive sleep apnea)     not tested; pt scored 4 per stop bang tool results sent to PCP   . Essential hypertension 05/01/2007    Qualifier: Diagnosis of  By: Tiney Rouge CMA, Ellison Hughs       Past Surgical History  Procedure Laterality Date  . Appendectomy    . Wrist surgery      right fx  . Shoulder surgery      right fx  . Back surgery  2001  . Carpal tunnel release Left   . Cataract extraction w/phaco Right 04/29/2013    Procedure: CATARACT EXTRACTION PHACO AND INTRAOCULAR LENS PLACEMENT (IOC);  Surgeon: Adonis Brook, MD;  Location: [place];  Service: Ophthalmology;  Laterality: Right;  . Colonoscopy w/ polypectomy  08/2011    Many polyps--all hyperplastic, severe diverticulosis, int hem  . Left heart catheterization with coronary angiogram N/A 10/26/2013    Procedure: LEFT HEART CATHETERIZATION WITH CORONARY ANGIOGRAM;  Surgeon: Jettie Booze, MD;  Location: [facility] CATH LAB;  Service: Cardiovascular;  Laterality: N/A;  . Tonsillectomy    . Cardiac catheterization    . Knee arthroscopy with medial menisectomy Right 12/16/2014    Procedure: RIGHT KNEE ARTHROSCOPY WITH MEDIAL MENISECTOMY microfracture medial femoral condyle abrasion condroplasty medial femoral  condyle lateral menisectomy;  Surgeon: Tobi Bastos, MD;  Location: [facility];  Service: Orthopedics;  Laterality: Right;     Current Outpatient Prescriptions  Medication Sig Dispense Refill  . aspirin 81 MG tablet Take 81 mg by mouth daily.      Marland Kitchen atorvastatin (LIPITOR) 80 MG tablet Take 1 tablet (80 mg total) by mouth daily. At 6pm. 90 tablet 3  . beta carotene w/minerals (OCUVITE) tablet Take 1 tablet by mouth daily.    . cetirizine (ZYRTEC) 10 MG tablet Take 10 mg by mouth daily.    . citalopram (CELEXA) 20 MG tablet TAKE ONE TABLET BY MOUTH ONCE DAILY 30 tablet 11  . clonazePAM (KLONOPIN) 1 MG tablet TAKE ONE TABLET BY MOUTH TWICE DAILY AS  NEEDED FOR ANXIETY 60 tablet 5  . clopidogrel (PLAVIX) 75 MG tablet Take 1 tablet (75 mg total) by mouth daily with breakfast. 30 tablet 3  . clotrimazole-betamethasone (LOTRISONE) cream APPLY   TOPICALLY TO AFFECTED AREA TWICE DAILY 45 g 3  . diltiazem (CARDIZEM CD) 120 MG 24 hr capsule TAKE ONE CAPSULE BY MOUTH ONCE DAILY 30 capsule 5  . gabapentin (NEURONTIN) 300 MG capsule 1 cap qAM, 1 cap mid afternoon, and 2 caps at bedtime (Patient taking differently: Take 300-600 mg by mouth 3 (three) times daily. 1 cap qAM, 1 cap mid afternoon, and 2 caps at bedtime) 120 capsule 6  . hydrOXYzine (ATARAX/VISTARIL) 25 MG tablet Take 1 tablet (25 mg total) by mouth every 4 (four) hours as needed for anxiety or itching. 60 tablet 5  . Insulin Lispro, Human, (HUMALOG KWIKPEN) 200 UNIT/ML SOPN Inject 24 Units into the skin 3 (three) times daily before meals. 15 mL 12  . Insulin NPH, Human,, Isophane, (HUMULIN N KWIKPEN) 100 UNIT/ML Kiwkpen 120 U SQ with BF and 50 U SQ with Supper (Patient taking differently: Inject 50-120 Units into the skin 2 (two) times daily. 120 U SQ with BF and 50 U SQ with Supper) 60 mL 12  . isosorbide mononitrate (IMDUR) 30 MG 24 hr tablet Take 1 tablet (30 mg total) by mouth daily. 30 tablet 3  . lisinopril (PRINIVIL,ZESTRIL) 40 MG tablet TAKE ONE TABLET BY MOUTH ONCE DAILY 90 tablet 1  . Omega-3 Fatty Acids ([employer] FISH OIL) 1000 MG CAPS Take 2 tablets by mouth 2 (two) times daily. 90 capsule   . ONE TOUCH ULTRA TEST test strip TEST 3 TIMES A DAY AS DIRECTED 100 each 11  . ONETOUCH DELICA LANCETS 63J MISC 1 each by Other route 3 (three) times daily. USE TO TEST 3 TIMES A DAY 100 each 11  . oxyCODONE-acetaminophen (PERCOCET) 10-325 MG per tablet Take 1 tablet by mouth every 4 (four) hours as needed for pain. 40 tablet 0  . torsemide (DEMADEX) 20 MG tablet TAKE ONE TABLET BY MOUTH ONCE DAILY 90 tablet 4  . zoster vaccine live, PF, (ZOSTAVAX) 49702 UNT/0.65ML injection Inject 19,400 Units into  the skin once. 1 vial 0   No current facility-administered medications for this visit.    Allergies:   Review of patient's allergies indicates no known allergies.    Social History:  The patient  reports that he quit smoking about 31 years ago. His smoking use included Cigarettes, Pipe, and Cigars. He has a 45 pack-year smoking history. His smokeless tobacco use includes Chew. He reports that he does not drink alcohol or use illicit drugs.   Family History:  The patient's family history includes Alcohol abuse in his  father; Aneurysm in his mother. There is no history of Colon cancer.    ROS:  Please see the history of present illness.   Otherwise, review of systems are positive for none.   All other systems are reviewed and negative.    PHYSICAL EXAM: VS:  BP 100/52 mmHg  Pulse 85  Ht 5\' 9"  (1.753 m)  Wt 321 lb 1.9 oz (145.659 kg)  BMI 47.40 kg/m2  SpO2 96% , BMI Body mass index is 47.4 kg/(m^2). GEN: Well nourished, well developed, in no acute distress HEENT: normal Neck: no JVD, carotid bruits, or masses Cardiac: RRR; no murmurs, rubs, or gallops,no edema  Respiratory:  clear to auscultation bilaterally, normal work of breathing GI: soft, nontender, nondistended, + BS MS: no deformity or atrophy Skin: warm and dry, no rash Neuro:  Strength and sensation are intact Psych: euthymic mood, full affect   Recent Labs: 09/16/2014: Pro B Natriuretic peptide (BNP) 5.0 12/08/2014: Hemoglobin 12.7*; Platelets 194 02/21/2015: ALT 21 03/08/2015: BUN 33*; Creatinine 1.18; Potassium 4.9; Sodium 134*    Lipid Panel    Component Value Date/Time   CHOL 183 02/21/2015 0820   TRIG 369.0* 02/21/2015 0820   TRIG 268* 09/06/2006 1555   HDL 33.00* 02/21/2015 0820   CHOLHDL 6 02/21/2015 0820   CHOLHDL 4.8 CALC 09/06/2006 1555   VLDL 73.8* 02/21/2015 0820   LDLCALC 25 03/18/2014 0828   LDLDIRECT 92.0 02/21/2015 0820   LDLDIRECT 96.1 09/06/2006 1555      Wt Readings from Last 3  Encounters:  03/15/15 321 lb 1.9 oz (145.659 kg)  03/08/15 323 lb (146.512 kg)  12/16/14 319 lb (144.697 kg)    ASSESSMENT AND PLAN: 1.  ASCAD with chronically occluded LAD with no angina on medical therapy. - continue ASA/Imdur/Plavix 2.  Chronic diastolic CHF appears compensated on exam - continue diltiazem/Torsemide/ACE I - check BMET 3. HTN well controlled - continue Diltiazem and ACE  4.  Obesity with probable OSA but he has refused sleep study 5.  Obesity hypoventilation syndrome on Home O2 6.  Dyslipidemia - continue atorvastatin - this was just increased to 80mg  daily and he has an FLP pending next month 7.  Chronic DOE secondary to obesity hypoventilation syndrome - I will recheck a BNP to make sure there is no component of CHF going on.    Current medicines are reviewed at length with the patient today.  The patient does not have concerns regarding medicines.  The following changes have been made:  no change  Labs/ tests ordered today include: see above assessment and plan No orders of the defined types were placed in this encounter.     Disposition:   FU with me in 6 months   Signed, Sueanne Margarita, MD  03/15/2015 9:00 AM    [facility] [place], [place], [place]  [address] Phone: [phone]; Fax: [phone]

## 2015-03-15 NOTE — Patient Instructions (Addendum)
Medication Instructions:  Your physician recommends that you continue on your current medications as directed. Please refer to the Current Medication list given to you today.   Labwork: TODAY: BMET  Your physician recommends that you return for FASTING lab work on April 15, 2015. You may come ANY TIME between 7:30 AM and 5:00 PM.  Testing/Procedures: None  Follow-Up: Your physician wants you to follow-up in: 6 months with Dr. Radford Pax. You will receive a reminder letter in the mail two months in advance. If you don't receive a letter, please call our office to schedule the follow-up appointment.   Any Other Special Instructions Will Be Listed Below (If Applicable).

## 2015-03-31 ENCOUNTER — Other Ambulatory Visit: Payer: Self-pay | Admitting: Internal Medicine

## 2015-04-08 DIAGNOSIS — J449 Chronic obstructive pulmonary disease, unspecified: Secondary | ICD-10-CM | POA: Diagnosis not present

## 2015-04-11 ENCOUNTER — Ambulatory Visit (INDEPENDENT_AMBULATORY_CARE_PROVIDER_SITE_OTHER): Payer: Medicare Other | Admitting: Family Medicine

## 2015-04-11 ENCOUNTER — Encounter: Payer: Self-pay | Admitting: Family Medicine

## 2015-04-11 VITALS — BP 105/65 | HR 94 | Temp 97.2°F | Resp 20 | Ht 69.0 in | Wt 327.0 lb

## 2015-04-11 DIAGNOSIS — Z0000 Encounter for general adult medical examination without abnormal findings: Secondary | ICD-10-CM | POA: Diagnosis not present

## 2015-04-11 DIAGNOSIS — H9193 Unspecified hearing loss, bilateral: Secondary | ICD-10-CM | POA: Diagnosis not present

## 2015-04-11 NOTE — Progress Notes (Signed)
Pre visit review using our clinic review tool, if applicable. No additional management support is needed unless otherwise documented below in the visit note. 

## 2015-04-11 NOTE — Progress Notes (Signed)
The patient is here for annual Medicare wellness examination and management of other chronic and acute problems.   The risk factors are reflected in the social history.  The roster of all physicians providing medical care to patient - is listed in the Snapshot section of the chart.  Activities of daily living:  The patient is 100% inedpendent in all ADLs: dressing, toileting, feeding as well as independent mobility  Home safety : The patient has smoke detectors in the home. They wear seatbelts.No firearms at home ( firearms are present in the home, kept in a safe fashion). There is no violence in the home.   There is no risks for hepatitis, STDs or HIV. There is no history of blood transfusion. They have no travel history to infectious disease endemic areas of the world.  The patient hasnot seen their dentist in the last six month: pt has dentures. They has seen their eye doctor in the last year. They admit to hearing difficulty and have not had audiologic testing in the last year--says both ears the same, wants audiology screening today..  They do not  have excessive sun exposure.  Discussed the need for sun protection: hats, long sleeves and use of sunscreen if there is significant sun exposure.   Diet: the importance of a healthy diet is discussed. They do not have a healthy diet.  The patient does not have a regular exercise program: he is limited by his obesity and his COPD and oxygen requirement/chronic DOE.  The benefits of regular aerobic exercise were discussed.  Depression screen: there are no signs or vegative symptoms of depression- irritability, change in appetite, anhedonia, sadness/tearfullness.  Cognitive assessment: the patient manages all their financial and personal affairs and is actively engaged. They could relate day,date,year and events; recalled 3/3 objects at 3 minutes; performed clock-face test normally.  The following portions of the patient's history were reviewed  and updated as appropriate: allergies, current medications, past family history, past medical history,  past surgical history, past social history  and problem list.  Vision, hearing, body mass index were assessed and reviewed.   During the course of the visit the patient was educated and counseled about appropriate screening and preventive services including : fall prevention , diabetes screening, nutrition counseling, colorectal cancer screening, and recommended immunizations.   Hearing Screening   125Hz  250Hz  500Hz  1000Hz  2000Hz  4000Hz  8000Hz   Right ear:   45 45 45 70   Left ear:   45 45 45 60      NEEDS DISCUSSED:  Prostate ca screening: we discussed the pro's and cons of screening today and he has chosen to decline this screening at this time. We will proceed with audiology referral for further evaluation of his hearing loss. No further preventative services needed at this time.

## 2015-04-12 DIAGNOSIS — M6281 Muscle weakness (generalized): Secondary | ICD-10-CM | POA: Diagnosis not present

## 2015-04-12 DIAGNOSIS — I509 Heart failure, unspecified: Secondary | ICD-10-CM | POA: Diagnosis not present

## 2015-04-12 DIAGNOSIS — J449 Chronic obstructive pulmonary disease, unspecified: Secondary | ICD-10-CM | POA: Diagnosis not present

## 2015-04-12 DIAGNOSIS — Z9981 Dependence on supplemental oxygen: Secondary | ICD-10-CM | POA: Diagnosis not present

## 2015-04-15 ENCOUNTER — Other Ambulatory Visit (INDEPENDENT_AMBULATORY_CARE_PROVIDER_SITE_OTHER): Payer: Medicare Other | Admitting: *Deleted

## 2015-04-15 DIAGNOSIS — E785 Hyperlipidemia, unspecified: Secondary | ICD-10-CM | POA: Diagnosis not present

## 2015-04-15 DIAGNOSIS — I2510 Atherosclerotic heart disease of native coronary artery without angina pectoris: Secondary | ICD-10-CM

## 2015-04-15 LAB — LDL CHOLESTEROL, DIRECT: Direct LDL: 74 mg/dL

## 2015-04-15 LAB — HEPATIC FUNCTION PANEL
ALK PHOS: 54 U/L (ref 39–117)
ALT: 25 U/L (ref 0–53)
AST: 23 U/L (ref 0–37)
Albumin: 4.2 g/dL (ref 3.5–5.2)
BILIRUBIN DIRECT: 0.1 mg/dL (ref 0.0–0.3)
BILIRUBIN TOTAL: 0.6 mg/dL (ref 0.2–1.2)
Total Protein: 7.1 g/dL (ref 6.0–8.3)

## 2015-04-15 LAB — LIPID PANEL
CHOL/HDL RATIO: 5
CHOLESTEROL: 166 mg/dL (ref 0–200)
HDL: 31.4 mg/dL — AB (ref >39.00)

## 2015-04-19 ENCOUNTER — Telehealth: Payer: Self-pay

## 2015-04-19 DIAGNOSIS — E785 Hyperlipidemia, unspecified: Secondary | ICD-10-CM

## 2015-04-19 NOTE — Telephone Encounter (Signed)
-----   Message from Aris Georgia, [facility] sent at 04/19/2015  8:36 AM EDT ----- RF: Diabetes, CAD, HTN, low HDL, age - LDL goal < 70, non-HDL goal < 100 Meds: Lipitor 80 mg qd, fish oil 4 gm daily LDL improved with increase of Lipitor from 40mg  to 80mg .  A1c increased to 6.9. This is likely the reason for increase in TG.  Non-HDL - 135 mg/dL.  Would make sure he is compliant with fish oil and encourage diet changes such as limiting carbohydrates and sugars.  If still high at next check, would suggest addition of fenofibrate.  Recheck labs in 4 months.

## 2015-04-19 NOTE — Telephone Encounter (Signed)
Informed patient of results and verbal understanding expressed.   Verified with patient he is compliant with fish oil. Encouraged patient to limit carbohydrates and sugars.  Lab work scheduled for November 1. Patient agrees with treatment plan.

## 2015-04-20 ENCOUNTER — Other Ambulatory Visit: Payer: Self-pay | Admitting: Family Medicine

## 2015-04-21 DIAGNOSIS — M1711 Unilateral primary osteoarthritis, right knee: Secondary | ICD-10-CM | POA: Diagnosis not present

## 2015-04-21 DIAGNOSIS — Z4789 Encounter for other orthopedic aftercare: Secondary | ICD-10-CM | POA: Diagnosis not present

## 2015-04-21 NOTE — Telephone Encounter (Signed)
LOV 04/11/15, NOV: 07/08/15, last written: 06/14/14 #120 w/ 6RF

## 2015-04-25 ENCOUNTER — Other Ambulatory Visit: Payer: Self-pay | Admitting: Internal Medicine

## 2015-04-25 NOTE — Telephone Encounter (Signed)
Pls verify with pt that he still takes this med. I thought we had taken him off of this med quite a while ago (it is no longer on his med list).  Let me know.-thx

## 2015-04-26 NOTE — Telephone Encounter (Signed)
Spoke to pt and he stated that his blood sugar has been running a little high so he has been taking the metformin as needed. He states that he is checking his blood sugar and if it is above 150 he will take a half of a metformin.

## 2015-05-08 DIAGNOSIS — J449 Chronic obstructive pulmonary disease, unspecified: Secondary | ICD-10-CM | POA: Diagnosis not present

## 2015-05-09 ENCOUNTER — Other Ambulatory Visit: Payer: Self-pay | Admitting: *Deleted

## 2015-05-09 MED ORDER — LISINOPRIL 40 MG PO TABS: ORAL_TABLET | ORAL | Status: DC

## 2015-05-09 NOTE — Telephone Encounter (Signed)
RF request for lisinopril.  LOV: 04/11/15 Next ov: 07/08/15 Last written: 04/21/14 #90 w/ 3Rf

## 2015-05-11 ENCOUNTER — Telehealth: Payer: Self-pay | Admitting: Cardiology

## 2015-05-11 NOTE — Telephone Encounter (Signed)
Patient st he did not receive his lab results from June. Reminded patient that we reviewed the lab work together and agreed he would watch his carbohydrates and sugar intake. Verified fasting lab appointment November 1 with patient. Patient agrees with treatment plan.

## 2015-05-11 NOTE — Telephone Encounter (Signed)
New message ° ° ° ° °Want lab results °

## 2015-05-12 DIAGNOSIS — M6281 Muscle weakness (generalized): Secondary | ICD-10-CM | POA: Diagnosis not present

## 2015-05-12 DIAGNOSIS — J449 Chronic obstructive pulmonary disease, unspecified: Secondary | ICD-10-CM | POA: Diagnosis not present

## 2015-05-12 DIAGNOSIS — Z9981 Dependence on supplemental oxygen: Secondary | ICD-10-CM | POA: Diagnosis not present

## 2015-05-12 DIAGNOSIS — I509 Heart failure, unspecified: Secondary | ICD-10-CM | POA: Diagnosis not present

## 2015-05-23 ENCOUNTER — Ambulatory Visit: Payer: Medicare Other | Attending: Audiology | Admitting: Audiology

## 2015-05-23 DIAGNOSIS — Z01118 Encounter for examination of ears and hearing with other abnormal findings: Secondary | ICD-10-CM | POA: Diagnosis not present

## 2015-05-23 DIAGNOSIS — H748X2 Other specified disorders of left middle ear and mastoid: Secondary | ICD-10-CM | POA: Diagnosis not present

## 2015-05-23 DIAGNOSIS — H93293 Other abnormal auditory perceptions, bilateral: Secondary | ICD-10-CM | POA: Diagnosis not present

## 2015-05-23 DIAGNOSIS — H9072 Mixed conductive and sensorineural hearing loss, unilateral, left ear, with unrestricted hearing on the contralateral side: Secondary | ICD-10-CM

## 2015-05-23 DIAGNOSIS — R292 Abnormal reflex: Secondary | ICD-10-CM | POA: Diagnosis not present

## 2015-05-23 DIAGNOSIS — H905 Unspecified sensorineural hearing loss: Secondary | ICD-10-CM | POA: Diagnosis not present

## 2015-05-23 DIAGNOSIS — R94128 Abnormal results of other function studies of ear and other special senses: Secondary | ICD-10-CM | POA: Diagnosis not present

## 2015-05-23 NOTE — Patient Instructions (Signed)
1.  Referral to ENT because of abnormal middle ear function on the left side which may be contributing to poorer hearing and balance issues.  2. Consider referral to Balance Assessment with physical therapist at [place] at the [place] on [facility] because of high risk for falls.  3.       Amplification helps make the signal louder and therefore often improves hearing and word recognition.  Amplification has many forms including hearing aids in one or both ears, an assistive listening device which have a microphone and speaker such as a small handheld device and/or even a surround sound system of speakers.  Amplification may be covered by some insurances, but not all.  It is important to note that hearing aids must be individually fit according to the hearing test results and the ear shape.  Audiologists and hearing aid dealers in [place] must be licensed in order to dispense hearing aids.  In addition, a trial period is mandated by law in our state because often amplification must be tried and then evaluated in order to determine benefit.       There are many excellent choices when it comes to amplification in our area and providers are listed in the phone book under hearing aids, may be affiliated with Ear, [place] and Throat physicians, are located at [place] as well as the [facility].  [employer] in [place] may help with obtaining one hearing aid or one captioned telephone if hearing loss and financial qualifications are met.  Please contact Rex Kras at [phone] .  [facility] [place], [place]  [facility] ENT  [place] and [place]  [place]  [phone]  4.  Consider caption call telephone service because of difficulty hearing on the phone  at home which is a free service under the [place].  Evrett Hakim L. Heide Spark, Au.D., CCC-A Doctor of Audiology 05/23/2015

## 2015-05-23 NOTE — Procedures (Signed)
Outpatient Rehabilitation and [facility] [address] [place], [place] [address] [place] EVALUATION Name: [name] DOB:  09-26-1946 MRN:  [id number]                               Diagnosis: Hearing loss Date: 05/23/2015   Referent: [name] Sou, MD  HISTORY: [name], age 69 y.o. years, was seen for an audiological evaluation.  He reports concerns about his balance with a history of falls and hearing difficulty. 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  He notices a gradual hearing loss but currently has "to have the TV very loud", has "difficulty hearing on the telephone" and has "difficulty with word recognition at home, church and other social situations".  He denies a feeling of pressure of fullness or tinnitus in his ears.  [name] reports a "constant feeling of being off-balance" and he needs to "sit down to make the sensation stop".  [name] [name] that he is currently being treated for "diabetes" and "hypertension" but that both are "under control".   EVALUATION: Pure tone air and tone conduction was completed using conventional audiometry with inserts.  Hearing thresholds are symmetrical and are 50-60 dBHL from 250Hz - 4000Hz and 75 dBHL at 8000Hz.  The hearing loss is sensorineural on the right side with a slight mixed component on the left side. Speech reception thresholds are 55 dBHL in the right ear and 50 dBHL in the left ear using recorded spondee words.  The reliability is good. Word recognition is 76% at [name] in the right and 76% at 75dBHL in the left using recorded NU-6 word lists in quiet with contralateral masking. In minimal background noise with +5dB signal to noise ratio word recognition is 54 % in the right ear and 42% in the left ear. Otoscopic inspection reveals clear ear canals with visible  tympanic membranes.  Tympanometry showed in the right ear has normal middle ear pressure, volume and compliance (Type A)  and in the left ear was abnormal with an unusual configuration and wide gradient of 410 daPa (Type B?).  Ipsilateral acoustic reflexes are abnormal bilaterally and are mostly no response except for an elevated response at 500Hz on the right side.   CONCLUSION:      [name] has a moderate low frequency sloping to a moderately severe high frequency hearing loss bilaterally. The hearing loss is sensorineural on the right with a slight mixed component, but predominantly sensorineural on the left side with abnormal middle ear function.  Further evaluation by an ENT is needed because of the abnormal middle ear function, balance issues and hearing loss. In addition, a balance assessment by a physical therapist is strongly recommended.    Word recognition is fair in quiet at loud levels and in minimal background noise, word recognition drops to poor in each ear. Recruitment is present.  [name] may benefit from amplification therefore a hearing aid evaluation is recommended.    RECOMMENDATIONS: 1.   1.  Referral to ENT because of abnormal middle ear function on the left side which may be contributing to poorer hearing and balance issues.  2. Consider referral to Balance Assessment with physical therapist at [place] at the [place] on [facility] because of concern  about being at high risk for falls.  3.    A hearing aid evaluation.  Amplification helps make the signal louder and therefore often improves hearing and word recognition.  Amplification has many forms including hearing aids in one or both ears, an assistive listening device which have a microphone and speaker such as a small handheld device and/or even a surround sound system of speakers.  Amplification may be covered by some insurances, but not all.  It is important to  note that hearing aids must be individually fit according to the hearing test results and the ear shape.  Audiologists and hearing aid dealers in [place] must be licensed in order to dispense hearing aids.  In addition, a trial period is mandated by law in our state because often amplification must be tried and then evaluated in order to determine benefit.       There are many excellent choices when it comes to amplification in our area and providers are listed in the phone book under hearing aids, may be affiliated with Ear, [place] and Throat physicians, are located at [place] as well as the [facility] hearing center.   [employer] in [place] may help with obtaining one hearing aid or one captioned telephone if hearing loss and financial qualifications are met.  Please contact [name] at [phone] .  Places associated with this program include:  [facility] [place], [place]  [facility] ENT  [place] and [place]  [place]  [phone]  4.  Consider caption call telephone service because of difficulty hearing on the phone at home which is a free service under the [place].  5.  Strategies that help improve hearing include: A) Face the speaker directly. Optimal is having the speakers face well - lit.  Unless amplified, being within 3-6 feet of the speaker will enhance word recognition. B) Avoid having the speaker back-lit as this will minimize the ability to use cues from lip-reading, facial expression and gestures. C)  Word recognition is poorer in background noise. For optimal word recognition, turn off the TV, radio or noisy fan when engaging in conversation. In a restaurant, try to sit away from noise sources and close to the primary speaker.  D)  Ask for topic clarification from time to time  in order to remain in the conversation.  Most people don't mind repeating or clarifying a point when asked.  If needed, explain the difficulty hearing in background noise or hearing loss. E) Consider using an assistive listening device at church and other social situations to make voices louder.  Ryota Treece L. Heide Spark, Au.D., CCC-A Doctor of Audiology 05/23/2015  cc: [name] Sou, MD

## 2015-05-30 DIAGNOSIS — H9113 Presbycusis, bilateral: Secondary | ICD-10-CM

## 2015-05-30 HISTORY — DX: Presbycusis, bilateral: H91.13

## 2015-05-31 ENCOUNTER — Telehealth: Payer: Self-pay | Admitting: Family Medicine

## 2015-05-31 DIAGNOSIS — R2689 Other abnormalities of gait and mobility: Secondary | ICD-10-CM

## 2015-05-31 DIAGNOSIS — H748X2 Other specified disorders of left middle ear and mastoid: Secondary | ICD-10-CM

## 2015-05-31 DIAGNOSIS — H9072 Mixed conductive and sensorineural hearing loss, unilateral, left ear, with unrestricted hearing on the contralateral side: Secondary | ICD-10-CM

## 2015-05-31 NOTE — Telephone Encounter (Signed)
[name] is calling to see if Dr. Anitra Lauth received the report from Rubye Oaks, the sudiologist) and if he can do a referral to an ENT as Hilda Blades suggested he be seen for the damage in his inner ear.

## 2015-06-01 NOTE — Telephone Encounter (Signed)
Pt called back in regards to this please advise. Thanks.

## 2015-06-01 NOTE — Telephone Encounter (Signed)
I ordered referral per pt's request.

## 2015-06-02 NOTE — Telephone Encounter (Signed)
Pt advised and voiced understanding. Waiting to hear from [place] or ENT office.

## 2015-06-08 DIAGNOSIS — J449 Chronic obstructive pulmonary disease, unspecified: Secondary | ICD-10-CM | POA: Diagnosis not present

## 2015-06-20 ENCOUNTER — Ambulatory Visit (INDEPENDENT_AMBULATORY_CARE_PROVIDER_SITE_OTHER): Payer: Medicare Other | Admitting: Family Medicine

## 2015-06-20 ENCOUNTER — Encounter: Payer: Self-pay | Admitting: Family Medicine

## 2015-06-20 VITALS — BP 95/63 | HR 75 | Temp 97.9°F | Resp 20 | Ht 69.0 in | Wt 334.0 lb

## 2015-06-20 DIAGNOSIS — M62830 Muscle spasm of back: Secondary | ICD-10-CM | POA: Diagnosis not present

## 2015-06-20 LAB — COMPLETE METABOLIC PANEL WITH GFR
ALT: 21 U/L (ref 9–46)
AST: 18 U/L (ref 10–35)
Albumin: 3.8 g/dL (ref 3.6–5.1)
Alkaline Phosphatase: 55 U/L (ref 40–115)
BILIRUBIN TOTAL: 0.5 mg/dL (ref 0.2–1.2)
BUN: 38 mg/dL — ABNORMAL HIGH (ref 7–25)
CHLORIDE: 98 mmol/L (ref 98–110)
CO2: 27 mmol/L (ref 20–31)
Calcium: 8.8 mg/dL (ref 8.6–10.3)
Creat: 1.51 mg/dL — ABNORMAL HIGH (ref 0.70–1.25)
GFR, EST NON AFRICAN AMERICAN: 46 mL/min — AB (ref >=60)
GFR, Est African American: 54 mL/min — ABNORMAL LOW (ref >=60)
GLUCOSE: 170 mg/dL — AB (ref 65–99)
Potassium: 5.4 mmol/L — ABNORMAL HIGH (ref 3.5–5.3)
SODIUM: 138 mmol/L (ref 135–146)
TOTAL PROTEIN: 6.9 g/dL (ref 6.1–8.1)

## 2015-06-20 MED ORDER — BACLOFEN 10 MG PO TABS: 5.0000 mg | ORAL_TABLET | Freq: Three times a day (TID) | ORAL | Status: DC

## 2015-06-20 NOTE — Progress Notes (Signed)
Pre visit review using our clinic review tool, if applicable. No additional management support is needed unless otherwise documented below in the visit note. 

## 2015-06-20 NOTE — Patient Instructions (Signed)
Muscle Cramps and Spasms Muscle cramps and spasms occur when a muscle or muscles tighten and you have no control over this tightening (involuntary muscle contraction). They are a common problem and can develop in any muscle. The most common place is in the calf muscles of the leg. Both muscle cramps and muscle spasms are involuntary muscle contractions, but they also have differences:   Muscle cramps are sporadic and painful. They may last a few seconds to a quarter of an hour. Muscle cramps are often more forceful and last longer than muscle spasms.  Muscle spasms may or may not be painful. They may also last just a few seconds or much longer. CAUSES  It is uncommon for cramps or spasms to be due to a serious underlying problem. In many cases, the cause of cramps or spasms is unknown. Some common causes are:   Overexertion.   Overuse from repetitive motions (doing the same thing over and over).   Remaining in a certain position for a long period of time.   Improper preparation, form, or technique while performing a sport or activity.   Dehydration.   Injury.   Side effects of some medicines.   Abnormally low levels of the salts and ions in your blood (electrolytes), especially potassium and calcium. This could happen if you are taking water pills (diuretics) or you are pregnant.  Some underlying medical problems can make it more likely to develop cramps or spasms. These include, but are not limited to:   Diabetes.   Parkinson disease.   Hormone disorders, such as thyroid problems.   Alcohol abuse.   Diseases specific to muscles, joints, and bones.   Blood vessel disease where not enough blood is getting to the muscles.  HOME CARE INSTRUCTIONS   Stay well hydrated. Drink enough water and fluids to keep your urine clear or pale yellow.  It may be helpful to massage, stretch, and relax the affected muscle.  For tight or tense muscles, use a warm towel, heating  pad, or hot shower water directed to the affected area.  If you are sore or have pain after a cramp or spasm, applying ice to the affected area may relieve discomfort.  Put ice in a plastic bag.  Place a towel between your skin and the bag.  Leave the ice on for 15-20 minutes, 03-04 times a day.  Medicines used to treat a known cause of cramps or spasms may help reduce their frequency or severity. Only take over-the-counter or prescription medicines as directed by your caregiver. SEEK MEDICAL CARE IF:  Your cramps or spasms get more severe, more frequent, or do not improve over time.  MAKE SURE YOU:   Understand these instructions.  Will watch your condition.  Will get help right away if you are not doing well or get worse. Document Released: 04/06/2002 Document Revised: 02/09/2013 Document Reviewed: 10/01/2012 [facility] Patient Information 2015 [place], [place]. This information is not intended to replace advice given to you by your health care provider. Make sure you discuss any questions you have with your health care provider.  I will call in a prescription for muscle spasms, take these only as needed. I will call you with your labs once they are available.  Use heating pad for comfort.

## 2015-06-20 NOTE — Progress Notes (Signed)
Subjective:    Patient ID: [name], male    DOB: Mar 18, 1946, 69 y.o.   MRN: [id number]  HPI  Back spasm: Patient reports approximately a one-week history with lower lumbar back spasms, he denies any pain without a spasm. He denies any spasms or pain radiation to his lower extremities. Patient states that he had back spasms prior to his back surgery in 2001. At that time he had a disc surgery with a neurosurgeon. He reports not having to use any type of muscle relaxers regularly since his surgery. He reports taking his daughters Robaxin over the weekend, which helped with his spasms. Patient is on  diuretic for CHF. Patient states that he's had right knee troubles, and had seen an orthopedic surgeon who had told him to strep light ankle weights and exercise lower extremity, strengthening the knee. Patient reports lifting weights not only which is knee extension but with full leg raises. Patient denies any urinary or bowel difficulties/changes. He reports being pretty much inactive, but feels the pain with bending more than straightening.  Former Smoker Past Medical History  Diagnosis Date  . DIABETES MELLITUS, TYPE II 06/24/2007  . HYPERLIPIDEMIA 06/24/2007  . HYPERTENSION 05/01/2007  . Hypoxemia 01/27/2010  . OBESITY 09/02/2008  . OSTEOARTHRITIS 05/01/2007  . Chronic diastolic CHF (congestive heart failure) 02/25/2009  . Asthma     as a child  . Cataract   . History of kidney stones   . Pruritic condition 05/2014    Allergist summer 2015, no new testing.  . Dermatitis 05/2014  . Coronary artery disease     chronically occluded LAD and diagonal with right to left collaterals, mild disease in the left circ and moderate disease in the mid RCA on medical management with Imdur, ASA, and Plavix.  . Myocardial infarction     pt states he was informed per MD that he has had one but pt was unaware   . COPD (chronic obstructive pulmonary disease)   . Obesity hypoventilation syndrome     oxygen  24/7  . 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 OSA (obstructive sleep apnea)     not tested; pt scored 4 per stop bang tool results sent to PCP   . Essential hypertension 05/01/2007    Qualifier: Diagnosis of  By: Tiney Rouge CMA, Ellison Hughs      No Known Allergies Past Surgical History  Procedure Laterality Date  . Appendectomy    . Wrist surgery      right fx  . Shoulder surgery      right fx  . Back surgery  2001  . Carpal tunnel release Left   . Cataract extraction w/phaco Right 04/29/2013    Procedure: CATARACT EXTRACTION PHACO AND INTRAOCULAR LENS PLACEMENT (IOC);  Surgeon: Adonis Brook, MD;  Location: [place];  Service: Ophthalmology;  Laterality: Right;  . Colonoscopy w/ polypectomy  08/2011    Many polyps--all hyperplastic, severe diverticulosis, int hem  . Left heart catheterization with coronary angiogram N/A 10/26/2013    Procedure: LEFT HEART CATHETERIZATION WITH CORONARY ANGIOGRAM;  Surgeon: Jettie Booze, MD;  [facility] C[facility] LAB;  Service: Cardiovascular;  Laterality: N/A;  . Tonsillectomy    . Cardiac catheterization    . Knee arthroscopy with medial menisectomy Right 12/16/2014    Procedure: RIGHT KNEE ARTHROSCOPY WITH MEDIAL MENISECTOMY microfracture medial femoral condyle abrasion condroplasty medial femoral condyle lateral menisectomy;  Surgeon: Tobi Bastos, MD;  Location: [facility];  Service: Orthopedics;  Laterality: Right;   Family History  Problem Relation Age of Onset  . Aneurysm Mother   . Alcohol abuse Father   . Colon cancer Neg Hx    Social History   Social History  . Marital Status: Married    Spouse Name: N/A  . Number of Children: N/A  . Years of Education: N/A   Occupational History  . Retired    Social History Main Topics  . Smoking status: Former Smoker -- 1.50 packs/day for 30 years    Types: Cigarettes, Pipe, Cigars    Quit date: 10/30/1983  . Smokeless tobacco: Current  User    Types: Chew  . Alcohol Use: No  . Drug Use: No  . Sexual Activity: Not on file   Other Topics Concern  . Not on file   Social History Narrative   Married, one son and one daughter.   His daughter and her two children live with him.   Coffee daily.  Former smoker.  No alcohol.   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      Review of Systems  Constitutional: Negative for activity change, appetite change, fatigue and unexpected weight change.  Gastrointestinal: Negative for nausea, vomiting, abdominal pain, diarrhea, constipation and rectal pain.  Genitourinary: Negative for difficulty urinating.  Musculoskeletal: Positive for back pain. Negative for myalgias, arthralgias and neck pain.  Skin: Negative for color change and pallor.  Neurological: Negative for weakness and numbness.      Objective:   Physical Exam BP 95/63 mmHg  Pulse 75  Temp(Src) 97.9 F (36.6 C) (Temporal)  Resp 20  Ht 5\' 9"  (1.753 m)  Wt 334 lb (151.501 kg)  BMI 49.30 kg/m2  SpO2 93% Gen: Afebrile. No acute distress. Nontoxic in appearance, well-developed, well-nourished, morbidly obese Caucasian male, with a walker for assistance MSK: No erythema, no soft tissue swelling. Discomfort with flexion greater than extension. Range of motion is decreased from chronic issues. Neurovascularly intact distally.     Assessment & Plan:  [name] is a 69 y.o. present for recurrent back spasms.  - Back spasms: Patient's back pain seem to respond to Robaxin, will refrain from Robaxin given patient is older than 70. Will do a trial of baclofen 5 mg 3 times a day when necessary. Patient to refrain from exercising for 1 week. Explained using knee extension only when starting back up his exercises, may want to use 2 pound weights instead of 5 pound weights to start. Rest, can use heating pad for comfort. No red flags today. Will obtain a CMP just to rule out electrolyte imbalance  considering dieretic use.

## 2015-06-21 ENCOUNTER — Telehealth: Payer: Self-pay | Admitting: Family Medicine

## 2015-06-21 DIAGNOSIS — E875 Hyperkalemia: Secondary | ICD-10-CM

## 2015-06-21 DIAGNOSIS — N179 Acute kidney failure, unspecified: Secondary | ICD-10-CM

## 2015-06-21 NOTE — Telephone Encounter (Signed)
Please call pt: he has very mildly elevated potassium and his kidney function is decreased from prior but recent collections.  -  would like him to stay well hydrated, avoid the exercises for a week and then return to have labs retested. This could just be from dehydration/exercises.  -  I do not see a potassium supplement on his med list, but if he is taking one then he should hold it for 2 days. He should also avoid potassium enriched food for a few days (bananas, green leafy veggies etc).  - I have placed a future order for BMP and UA. Please advise him to make an appointment to have these collected on Friday or Monday (lab appointment only unless he is not getting relief from muscle relaxer or pain is worsening.) Thanks.

## 2015-06-22 LAB — FECAL OCCULT BLOOD, GUAIAC: Fecal Occult Blood: NEGATIVE

## 2015-06-23 DIAGNOSIS — H903 Sensorineural hearing loss, bilateral: Secondary | ICD-10-CM | POA: Diagnosis not present

## 2015-06-23 DIAGNOSIS — H9113 Presbycusis, bilateral: Secondary | ICD-10-CM | POA: Diagnosis not present

## 2015-06-27 ENCOUNTER — Encounter: Payer: Self-pay | Admitting: Family Medicine

## 2015-06-28 NOTE — Telephone Encounter (Signed)
Pt aware of results and instructions.  Pt scheduled appt 07/01/15.

## 2015-07-01 ENCOUNTER — Other Ambulatory Visit (INDEPENDENT_AMBULATORY_CARE_PROVIDER_SITE_OTHER): Payer: Medicare Other

## 2015-07-01 DIAGNOSIS — N179 Acute kidney failure, unspecified: Secondary | ICD-10-CM

## 2015-07-01 DIAGNOSIS — E875 Hyperkalemia: Secondary | ICD-10-CM

## 2015-07-01 LAB — URINALYSIS, ROUTINE W REFLEX MICROSCOPIC
BILIRUBIN URINE: NEGATIVE
HGB URINE DIPSTICK: NEGATIVE
Ketones, ur: NEGATIVE
LEUKOCYTES UA: NEGATIVE
NITRITE: NEGATIVE
RBC / HPF: NONE SEEN (ref 0–?)
Specific Gravity, Urine: 1.01 (ref 1.000–1.030)
Total Protein, Urine: NEGATIVE
URINE GLUCOSE: NEGATIVE
Urobilinogen, UA: 0.2 (ref 0.0–1.0)
WBC, UA: NONE SEEN (ref 0–?)
pH: 6 (ref 5.0–8.0)

## 2015-07-01 LAB — BASIC METABOLIC PANEL WITH GFR
BUN: 25 mg/dL (ref 7–25)
CALCIUM: 9.7 mg/dL (ref 8.6–10.3)
CO2: 30 mmol/L (ref 20–31)
Chloride: 97 mmol/L — ABNORMAL LOW (ref 98–110)
Creat: 1.07 mg/dL (ref 0.70–1.18)
GFR, Est African American: 81 mL/min (ref >=60)
GFR, Est Non African American: 70 mL/min (ref >=60)
Glucose, Bld: 101 mg/dL — ABNORMAL HIGH (ref 65–99)
POTASSIUM: 4.9 mmol/L (ref 3.5–5.3)
Sodium: 137 mmol/L (ref 135–146)

## 2015-07-04 ENCOUNTER — Other Ambulatory Visit: Payer: Self-pay | Admitting: Family Medicine

## 2015-07-05 ENCOUNTER — Telehealth: Payer: Self-pay | Admitting: Family Medicine

## 2015-07-05 NOTE — Telephone Encounter (Signed)
Patient aware of normal lab results.  No questions at this time.

## 2015-07-05 NOTE — Telephone Encounter (Signed)
Please call pt, his repeat labs looked good.

## 2015-07-05 NOTE — Telephone Encounter (Signed)
RF request for clonazepam LOV: 04/11/15 Next ov: 07/07/14 Last written: 01/10/15 #60 w/ 5RF Please advise. Thanks.

## 2015-07-08 ENCOUNTER — Ambulatory Visit (INDEPENDENT_AMBULATORY_CARE_PROVIDER_SITE_OTHER): Payer: Medicare Other | Admitting: Family Medicine

## 2015-07-08 ENCOUNTER — Encounter: Payer: Self-pay | Admitting: Family Medicine

## 2015-07-08 VITALS — BP 110/66 | HR 132 | Temp 98.0°F | Resp 16 | Ht 69.0 in | Wt 327.0 lb

## 2015-07-08 DIAGNOSIS — I10 Essential (primary) hypertension: Secondary | ICD-10-CM

## 2015-07-08 DIAGNOSIS — E119 Type 2 diabetes mellitus without complications: Secondary | ICD-10-CM

## 2015-07-08 DIAGNOSIS — Z23 Encounter for immunization: Secondary | ICD-10-CM

## 2015-07-08 DIAGNOSIS — Z0000 Encounter for general adult medical examination without abnormal findings: Secondary | ICD-10-CM

## 2015-07-08 DIAGNOSIS — J438 Other emphysema: Secondary | ICD-10-CM

## 2015-07-08 DIAGNOSIS — E662 Morbid (severe) obesity with alveolar hypoventilation: Secondary | ICD-10-CM

## 2015-07-08 LAB — POCT GLYCOSYLATED HEMOGLOBIN (HGB A1C): Hemoglobin A1C: 6.4

## 2015-07-08 LAB — HM DIABETES FOOT EXAM

## 2015-07-08 NOTE — Progress Notes (Addendum)
OFFICE VISIT  07/08/2015   CC:  Chief Complaint  Patient presents with  . Follow-up    Pt is fasting.   HPI:    Patient is a 69 y.o. Caucasian male who presents for 4 mo f/u DM 2, HTN, COPD, obesity hypoventilation syndrome. Says back spasms he was having about a month ago have responded to baclofen.  DM 2: glucoses vary from 120 to 200 fasting.  Difficult to tell if he is taking glucose measurements in evening. Taking 120 N qAM and 50 U N qPM, taking 24U humalog at each meal. Taking metformin wrong: occ taking 1/2 pill "if sugar high". Feet: no burning, tingling, or numbness in feet, no hx of foot ulcer.  BP: No bp monitoring from home.  Compliant with meds. Breathing has felt stable lately: his baseline functioning is DOE with walking 15-20 steps.  Resting makes his breathing return to normal quickly.  Wears 2L oxygen 24/7. Not wearing CPAP b/c he says insurance company would not pay for a sleep study.   Past Medical History  Diagnosis Date  . DIABETES MELLITUS, TYPE II 06/24/2007  . HYPERLIPIDEMIA 06/24/2007  . HYPERTENSION 05/01/2007  . Hypoxemia 01/27/2010  . OBESITY 09/02/2008  . OSTEOARTHRITIS 05/01/2007  . Chronic diastolic CHF (congestive heart failure) 02/25/2009  . Asthma     as a child  . Cataract   . History of kidney stones   . Pruritic condition 05/2014    Allergist summer 2015, no new testing.  . Dermatitis 05/2014  . Coronary artery disease     chronically occluded LAD and diagonal with right to left collaterals, mild disease in the left circ and moderate disease in the mid RCA on medical management with Imdur, ASA, and Plavix.  . Myocardial infarction     pt states he was informed per MD that he has had one but pt was unaware   . COPD (chronic obstructive pulmonary disease)   . Obesity hypoventilation syndrome     oxygen 24/7  . 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 OSA (obstructive sleep apnea)      not tested; pt scored 4 per stop bang tool results sent to PCP   . Essential hypertension 05/01/2007    Qualifier: Diagnosis of  By: Tiney Rouge CMA, Ellison Hughs     . Presbycusis of both ears 05/2015    [facility]    Past Surgical History  Procedure Laterality Date  . Appendectomy    . Wrist surgery      right fx  . Shoulder surgery      right fx  . Back surgery  2001  . Carpal tunnel release Left   . Cataract extraction w/phaco Right 04/29/2013    Procedure: CATARACT EXTRACTION PHACO AND INTRAOCULAR LENS PLACEMENT (IOC);  Surgeon: Adonis Brook, MD;  Location: [facility];  Service: Ophthalmology;  Laterality: Right;  . Colonoscopy w/ polypectomy  08/2011    Many polyps--all hyperplastic, severe diverticulosis, int hem  . Left heart catheterization with coronary angiogram N/A 10/26/2013    Procedure: LEFT HEART CATHETERIZATION WITH CORONARY ANGIOGRAM;  Surgeon: Jettie Booze, MD;  Location: [facility] CATH LAB;  Service: Cardiovascular;  Laterality: N/A;  . Tonsillectomy    . Cardiac catheterization    . Knee arthroscopy with medial menisectomy Right 12/16/2014    Procedure: RIGHT KNEE ARTHROSCOPY WITH MEDIAL MENISECTOMY microfracture medial femoral condyle abrasion condroplasty medial femoral condyle lateral  menisectomy;  Surgeon: Tobi Bastos, MD;  Location: [facility];  Service: Orthopedics;  Laterality: Right;    Outpatient Prescriptions Prior to Visit  Medication Sig Dispense Refill  . aspirin 81 MG tablet Take 81 mg by mouth daily.      Marland Kitchen atorvastatin (LIPITOR) 80 MG tablet Take 1 tablet (80 mg total) by mouth daily. At 6pm. 90 tablet 3  . baclofen (LIORESAL) 10 MG tablet Take 0.5 tablets (5 mg total) by mouth 3 (three) times daily. 30 each 0  . beta carotene w/minerals (OCUVITE) tablet Take 1 tablet by mouth daily.    . cetirizine (ZYRTEC) 10 MG tablet Take 10 mg by mouth daily.    . citalopram (CELEXA) 20 MG tablet TAKE ONE TABLET BY MOUTH ONCE DAILY 30 tablet 11  . clonazePAM (KLONOPIN) 1 MG  tablet TAKE ONE TABLET BY MOUTH TWICE DAILY AS NEEDED FOR ANXIETY 60 tablet 5  . clopidogrel (PLAVIX) 75 MG tablet Take 1 tablet (75 mg total) by mouth daily with breakfast. 30 tablet 3  . clotrimazole-betamethasone (LOTRISONE) cream APPLY  CREAM TOPICALLY TO AFFECTED AREA TWICE DAILY 45 g 6  . diltiazem (CARDIZEM CD) 120 MG 24 hr capsule TAKE ONE CAPSULE BY MOUTH ONCE DAILY 30 capsule 5  . gabapentin (NEURONTIN) 300 MG capsule TAKE ONE CAPSULE BY MOUTH THREE TIMES DAILY 90 capsule 3  . Insulin Lispro, Human, (HUMALOG KWIKPEN) 200 UNIT/ML SOPN Inject 24 Units into the skin 3 (three) times daily before meals. 15 mL 12  . Insulin NPH, Human,, Isophane, (HUMULIN N KWIKPEN) 100 UNIT/ML Kiwkpen 120 U SQ with BF and 50 U SQ with Supper (Patient taking differently: Inject 50-120 Units into the skin 2 (two) times daily. 120 U SQ with BF and 50 U SQ with Supper) 60 mL 12  . isosorbide mononitrate (IMDUR) 30 MG 24 hr tablet Take 1 tablet (30 mg total) by mouth daily. 30 tablet 3  . lisinopril (PRINIVIL,ZESTRIL) 40 MG tablet TAKE ONE TABLET BY MOUTH ONCE DAILY 90 tablet 1  . metFORMIN (GLUCOPHAGE) 1000 MG tablet TAKE ONE TABLET BY MOUTH TWICE DAILY WITH  MEALS 60 tablet 6  . Omega-3 Fatty Acids ([employer] FISH OIL) 1000 MG CAPS Take 2 tablets by mouth 2 (two) times daily. 90 capsule   . ONE TOUCH ULTRA TEST test strip TEST 3 TIMES A DAY AS DIRECTED 100 each 11  . ONETOUCH DELICA LANCETS 79K MISC 1 each by Other route 3 (three) times daily. USE TO TEST 3 TIMES A DAY 100 each 11  . torsemide (DEMADEX) 20 MG tablet TAKE ONE TABLET BY MOUTH ONCE DAILY 90 tablet 4  . hydrOXYzine (ATARAX/VISTARIL) 25 MG tablet Take 1 tablet (25 mg total) by mouth every 4 (four) hours as needed for anxiety or itching. (Patient not taking: Reported on 07/08/2015) 60 tablet 5  . oxyCODONE-acetaminophen (PERCOCET) 10-325 MG per tablet Take 1 tablet by mouth every 4 (four) hours as needed for pain. (Patient not taking: Reported on 06/20/2015) 40  tablet 0   No facility-administered medications prior to visit.    No Known Allergies  ROS As per HPI  PE: Blood pressure 110/66, pulse 132, temperature 98 F (36.7 C), temperature source Oral, resp. rate 16, height 5\' 9"  (1.753 m), weight 327 lb (148.326 kg), SpO2 93 %. Gen: Alert, well appearing.  Patient is oriented to person, place, time, and situation. Foot exam - bilateral normal; no swelling, tenderness or skin or vascular lesions. Color and temperature is normal. Sensation  is intact. Peripheral pulses are palpable. Toenails are normal.   LABS:  Lab Results  Component Value Date   TSH 3.04 08/30/2010   Lab Results  Component Value Date   WBC 7.5 12/08/2014   HGB 12.7* 12/08/2014   HCT 41.9 12/08/2014   MCV 89.0 12/08/2014   PLT 194 12/08/2014   Lab Results  Component Value Date   CREATININE 1.07 07/01/2015   BUN 25 07/01/2015   NA 137 07/01/2015   K 4.9 07/01/2015   CL 97* 07/01/2015   CO2 30 07/01/2015   Lab Results  Component Value Date   ALT 21 06/20/2015   AST 18 06/20/2015   ALKPHOS 55 06/20/2015   BILITOT 0.5 06/20/2015   Lab Results  Component Value Date   CHOL 166 04/15/2015   Lab Results  Component Value Date   HDL 31.40* 04/15/2015   Lab Results  Component Value Date   LDLCALC 25 03/18/2014   Lab Results  Component Value Date   TRIG * 04/15/2015    411.0 Triglyceride is over 400; calculations on Lipids are invalid.   Lab Results  Component Value Date   CHOLHDL 5 04/15/2015   Lab Results  Component Value Date   HGBA1C 6.9* 03/08/2015    IMPRESSION AND PLAN:  1) DM 2, control fair via home measurments. POCT A1c today: 6.4% Feet exam normal today. I recommended he start taking his metformin 1000 mg bid scheduled.  2) HTN; The current medical regimen is effective;  continue present plan and medications. Recent lytes/cr stable.  3) Hyperlipidemia/hypertrig: pt was put on fish oil tabs by cardiologist, has f/u with Dr.  Radford Pax 08/2015.  4) Obesity hypoventilation syndrome + COPD: stable, continue oxygen.  5) Prev health care: high dose flu vaccine given today.  An After Visit Summary was printed and given to the patient.   FOLLOW UP: Return in about 4 months (around 11/07/2015) for routine chronic illness f/u (30 min).

## 2015-07-08 NOTE — Addendum Note (Signed)
Addended by: Lanae Crumbly on: 07/08/2015 09:15 AM   Modules accepted: Orders

## 2015-07-08 NOTE — Progress Notes (Signed)
Pre visit review using our clinic review tool, if applicable. No additional management support is needed unless otherwise documented below in the visit note. 

## 2015-07-09 DIAGNOSIS — J449 Chronic obstructive pulmonary disease, unspecified: Secondary | ICD-10-CM | POA: Diagnosis not present

## 2015-07-13 ENCOUNTER — Encounter: Payer: Self-pay | Admitting: Family Medicine

## 2015-08-01 ENCOUNTER — Encounter: Payer: Self-pay | Admitting: Family Medicine

## 2015-08-05 DIAGNOSIS — H2512 Age-related nuclear cataract, left eye: Secondary | ICD-10-CM | POA: Diagnosis not present

## 2015-08-05 DIAGNOSIS — Z794 Long term (current) use of insulin: Secondary | ICD-10-CM | POA: Diagnosis not present

## 2015-08-05 LAB — HM DIABETES EYE EXAM

## 2015-08-08 ENCOUNTER — Encounter: Payer: Self-pay | Admitting: Family Medicine

## 2015-08-08 DIAGNOSIS — J449 Chronic obstructive pulmonary disease, unspecified: Secondary | ICD-10-CM | POA: Diagnosis not present

## 2015-08-09 ENCOUNTER — Encounter: Payer: Self-pay | Admitting: *Deleted

## 2015-08-25 ENCOUNTER — Other Ambulatory Visit: Payer: Self-pay | Admitting: *Deleted

## 2015-08-25 MED ORDER — GLUCOSE BLOOD VI STRP: ORAL_STRIP | Status: DC

## 2015-08-25 MED ORDER — ONETOUCH DELICA LANCETS 33G MISC: 1.0000 | Freq: Three times a day (TID) | Status: DC

## 2015-08-25 NOTE — Telephone Encounter (Signed)
RF request for test strips  Last written:08/22/14 #100 w/ 11RF  RF request for lancets Last written: 08/23/14 #100 w/ 11RF  LOV: 07/08/15 NOV: 11/09/15

## 2015-08-28 ENCOUNTER — Other Ambulatory Visit: Payer: Self-pay | Admitting: Family Medicine

## 2015-08-29 NOTE — Telephone Encounter (Signed)
LOV: 07/08/15 NOV: 11/09/15  RF request for isosorbide  Last written: 10/14/14 #30 w/ 3RF  RF request for gabapentin Last written: 04/21/15 #90 w/ 3RF  Please advise. Thanks.

## 2015-08-29 NOTE — Telephone Encounter (Signed)
Pt LMOM on 08/29/15 requesting refills.  Pt advised Rx's have been sent in.

## 2015-08-30 ENCOUNTER — Other Ambulatory Visit (INDEPENDENT_AMBULATORY_CARE_PROVIDER_SITE_OTHER): Payer: Medicare Other | Admitting: *Deleted

## 2015-08-30 DIAGNOSIS — E785 Hyperlipidemia, unspecified: Secondary | ICD-10-CM | POA: Diagnosis not present

## 2015-08-30 LAB — LIPID PANEL
Cholesterol: 132 mg/dL (ref 125–200)
HDL: 28 mg/dL — ABNORMAL LOW (ref >=40)
LDL CALC: 36 mg/dL (ref <130)
TRIGLYCERIDES: 341 mg/dL — AB (ref <150)
Total CHOL/HDL Ratio: 4.7 Ratio (ref <=5.0)
VLDL: 68 mg/dL — AB (ref <30)

## 2015-08-30 LAB — HEPATIC FUNCTION PANEL
ALK PHOS: 44 U/L (ref 40–115)
ALT: 26 U/L (ref 9–46)
AST: 21 U/L (ref 10–35)
Albumin: 3.8 g/dL (ref 3.6–5.1)
BILIRUBIN INDIRECT: 0.4 mg/dL (ref 0.2–1.2)
BILIRUBIN TOTAL: 0.5 mg/dL (ref 0.2–1.2)
Bilirubin, Direct: 0.1 mg/dL (ref <=0.2)
TOTAL PROTEIN: 6.7 g/dL (ref 6.1–8.1)

## 2015-08-30 NOTE — Addendum Note (Signed)
Addended by: Eulis Foster on: 08/30/2015 07:36 AM   Modules accepted: Orders

## 2015-08-30 NOTE — Addendum Note (Signed)
Addended by: Norberta Stobaugh K on: 11/08/2014 07:35 AM   Modules accepted: Orders  

## 2015-09-08 DIAGNOSIS — J449 Chronic obstructive pulmonary disease, unspecified: Secondary | ICD-10-CM | POA: Diagnosis not present

## 2015-09-11 ENCOUNTER — Other Ambulatory Visit: Payer: Self-pay | Admitting: Family Medicine

## 2015-09-11 NOTE — Progress Notes (Signed)
Cardiology Office Note   Date:  09/12/2015   ID:  [name], DOB 12/24/1945, MRN [id number]  PCP:  Tammi Sou, MD    Chief Complaint  Patient presents with  . Coronary Artery Disease  . Hypertension  . Congestive Heart Failure      History of Present Illness: [name] is a 69 y.o. male with a history of obesity hypoventilation syndrome on home O2, HTN, dyslipidemia, morbid obesity and chronic LE edema which is controlled with TED hose stockings and diuretic. He has chronic diastolic CHF controlled on diuretics. He has ASCAD with severely diseased and chronically occluded LAD and diagonal with right to left collaterals, mild disease in the left circ and moderate disease in the mid RCA on medical management with Imdur and Plavix. He presents back today for followup. He is doing well. He has chronic SOB related to his underlying obesity hypoventilation and is on home O2 and he thinks his DOE is stable. He denies any chest pain, dizziness, palpitations or syncope. It was felt in the past that he may have OSA but has refused sleep study.     Past Medical History  Diagnosis Date  . DIABETES MELLITUS, TYPE II 06/24/2007    No diab retpthy as of 08/05/15 eye exam.  . HYPERLIPIDEMIA 06/24/2007  . HYPERTENSION 05/01/2007  . Hypoxemia 01/27/2010  . OBESITY 09/02/2008  . OSTEOARTHRITIS 05/01/2007  . Chronic diastolic CHF (congestive heart failure) ([place]) 02/25/2009  . Asthma     as a child  . Cataract     multiple types, bilateral  . History of kidney stones   . Pruritic condition 05/2014    Allergist summer 2015, no new testing.  . Dermatitis 05/2014  . Coronary artery disease     chronically occluded LAD and diagonal with right to left collaterals, mild disease in the left circ and moderate disease in the mid RCA on medical management with Imdur, ASA, and Plavix.  . Myocardial infarction [facility])     pt states he was informed per MD that he has had  one but pt was unaware   . COPD (chronic obstructive pulmonary disease) ([place])   . Obesity hypoventilation syndrome ([place])     oxygen 24/7  . 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 OSA (obstructive sleep apnea)     not tested; pt scored 4 per stop bang tool results sent to PCP   . Essential hypertension 05/01/2007    Qualifier: Diagnosis of  By: Tiney Rouge CMA, Ellison Hughs     . Presbycusis of both ears 05/2015    [facility]    Past Surgical History  Procedure Laterality Date  . Appendectomy    . Wrist surgery      right fx  . Shoulder surgery      right fx  . Back surgery  2001  . Carpal tunnel release Left   . Cataract extraction w/phaco Right 04/29/2013    Procedure: CATARACT EXTRACTION PHACO AND INTRAOCULAR LENS PLACEMENT (IOC);  Surgeon: Adonis Brook, MD;  Location: [facility];  Service: Ophthalmology;  Laterality: Right;  . Colonoscopy w/ polypectomy  08/2011    Many polyps--all hyperplastic, severe diverticulosis, int hem.  [employer] hemoccult testing via [facility] [employer] 06/22/15 NEG  . Left heart catheterization with coronary angiogram N/A 10/26/2013  Procedure: LEFT HEART CATHETERIZATION WITH CORONARY ANGIOGRAM;  Surgeon: Jettie Booze, MD;  Location: [facility] CATH LAB;  Service: Cardiovascular;  Laterality: N/A;  . Tonsillectomy    . Cardiac catheterization    . Knee arthroscopy with medial menisectomy Right 12/16/2014    Procedure: RIGHT KNEE ARTHROSCOPY WITH MEDIAL MENISECTOMY microfracture medial femoral condyle abrasion condroplasty medial femoral condyle lateral menisectomy;  Surgeon: Tobi Bastos, MD;  Location: [facility];  Service: Orthopedics;  Laterality: Right;     Current Outpatient Prescriptions  Medication Sig Dispense Refill  . aspirin 81 MG tablet Take 81 mg by mouth daily.      Marland Kitchen atorvastatin (LIPITOR) 80 MG tablet Take 1 tablet (80 mg total) by mouth daily. At 6pm. 90 tablet 3  . baclofen (LIORESAL) 10 MG tablet Take  0.5 tablets (5 mg total) by mouth 3 (three) times daily. 30 each 0  . beta carotene w/minerals (OCUVITE) tablet Take 1 tablet by mouth daily.    . cetirizine (ZYRTEC) 10 MG tablet Take 10 mg by mouth daily.    . citalopram (CELEXA) 20 MG tablet TAKE ONE TABLET BY MOUTH ONCE DAILY 30 tablet 11  . clonazePAM (KLONOPIN) 1 MG tablet TAKE ONE TABLET BY MOUTH TWICE DAILY AS NEEDED FOR ANXIETY 60 tablet 5  . clopidogrel (PLAVIX) 75 MG tablet Take 1 tablet (75 mg total) by mouth daily with breakfast. 30 tablet 3  . clotrimazole-betamethasone (LOTRISONE) cream APPLY  CREAM TOPICALLY TO AFFECTED AREA TWICE DAILY 45 g 6  . diltiazem (CARDIZEM CD) 120 MG 24 hr capsule TAKE ONE CAPSULE BY MOUTH ONCE DAILY 30 capsule 5  . gabapentin (NEURONTIN) 300 MG capsule TAKE ONE CAPSULE BY MOUTH THREE TIMES DAILY 270 capsule 3  . glucose blood (ONE TOUCH ULTRA TEST) test strip TEST 3 TIMES A DAY AS DIRECTED 100 each 11  . Insulin Lispro, Human, (HUMALOG KWIKPEN) 200 UNIT/ML SOPN Inject 24 Units into the skin 3 (three) times daily before meals. 15 mL 12  . Insulin NPH, Human,, Isophane, (HUMULIN N KWIKPEN) 100 UNIT/ML Kiwkpen 120 U SQ with BF and 50 U SQ with Supper (Patient taking differently: Inject 50-120 Units into the skin 2 (two) times daily. 120 U SQ with BF and 50 U SQ with Supper) 60 mL 12  . isosorbide mononitrate (IMDUR) 30 MG 24 hr tablet TAKE ONE TABLET BY MOUTH ONCE DAILY 90 tablet 3  . lisinopril (PRINIVIL,ZESTRIL) 40 MG tablet TAKE ONE TABLET BY MOUTH ONCE DAILY 90 tablet 1  . metFORMIN (GLUCOPHAGE) 1000 MG tablet TAKE ONE TABLET BY MOUTH TWICE DAILY WITH  MEALS 60 tablet 6  . Omega-3 Fatty Acids ([employer] FISH OIL) 1000 MG CAPS Take 2 tablets by mouth 2 (two) times daily. 90 capsule   . ONETOUCH DELICA LANCETS 99991111 MISC 1 each by Other route 3 (three) times daily. USE TO TEST 3 TIMES A DAY 100 each 11  . torsemide (DEMADEX) 20 MG tablet TAKE ONE TABLET BY MOUTH ONCE DAILY 90 tablet 4   No current  facility-administered medications for this visit.    Allergies:   Review of patient's allergies indicates no known allergies.    Social History:  The patient  reports that he quit smoking about 31 years ago. His smoking use included Cigarettes, Pipe, and Cigars. He has a 45 pack-year smoking history. His smokeless tobacco use includes Chew. He reports that he does not drink alcohol or use illicit drugs.   Family History:  The patient's family history includes  Alcohol abuse in his father; Aneurysm in his mother. There is no history of Colon cancer.    ROS:  Please see the history of present illness.   Otherwise, review of systems are positive for none.   All other systems are reviewed and negative.    PHYSICAL EXAM: VS:  BP 154/60 mmHg  Pulse 70  Ht 5\' 9"  (1.753 m)  Wt 332 lb 9.6 oz (150.866 kg)  BMI 49.09 kg/m2 , BMI Body mass index is 49.09 kg/(m^2). GEN: Well nourished, well developed, in no acute distress HEENT: normal Neck: no JVD, carotid bruits, or masses Cardiac: RRR; no murmurs, rubs, or gallops,no edema  Respiratory:  clear to auscultation bilaterally, normal work of breathing GI: soft, nontender, nondistended, + BS MS: no deformity or atrophy Skin: warm and dry, no rash Neuro:  Strength and sensation are intact Psych: euthymic mood, full affect   EKG:  EKG was ordered today and showed NSR at 98bpm and RBBB, inferior infarct    Recent Labs: 09/16/2014: Pro B Natriuretic peptide (BNP) 5.0 12/08/2014: Hemoglobin 12.7*; Platelets 194 07/01/2015: BUN 25; Creat 1.07; Potassium 4.9; Sodium 137 08/30/2015: ALT 26    Lipid Panel    Component Value Date/Time   CHOL 132 08/30/2015 0736   TRIG 341* 08/30/2015 0736   TRIG 268* 09/06/2006 1555   HDL 28* 08/30/2015 0736   CHOLHDL 4.7 08/30/2015 0736   CHOLHDL 4.8 CALC 09/06/2006 1555   VLDL 68* 08/30/2015 0736   LDLCALC 36 08/30/2015 0736   LDLDIRECT 74.0 04/15/2015 0838   LDLDIRECT 96.1 09/06/2006 1555      Wt  Readings from Last 3 Encounters:  09/12/15 332 lb 9.6 oz (150.866 kg)  07/08/15 327 lb (148.326 kg)  06/20/15 334 lb (151.501 kg)     ASSESSMENT AND PLAN: 1. ASCAD with chronically occluded LAD with no angina on medical therapy. - continue ASA/Imdur/Plavix/statin. 2. Chronic diastolic CHF appears compensated on exam - continue diltiazem/Torsemide/ACE I/BB 3. HTN borderline controlled today but at home it is usually 115-120/70 bpm. - continue Diltiazem/BB and ACE  4. Obesity with probable OSA but he has refused sleep study 5. Obesity hypoventilation syndrome on Home O2 6. Dyslipidemia - LDL at goal but TAGs still high - continue atorvastatin -  Refer to lipid clinic    Current medicines are reviewed at length with the patient today.  The patient does not have concerns regarding medicines.  The following changes have been made:  no change  Labs/ tests ordered today: See above Assessment and Plan  Orders Placed This Encounter  Procedures  . EKG 12-Lead     Disposition:   FU with me in 6 months  Signed, Sueanne Margarita, MD  09/12/2015 8:33 AM    [facility] [employer], [place], [place]  [address] Phone: [phone]; Fax: [phone]

## 2015-09-12 ENCOUNTER — Encounter: Payer: Self-pay | Admitting: Cardiology

## 2015-09-12 ENCOUNTER — Ambulatory Visit (INDEPENDENT_AMBULATORY_CARE_PROVIDER_SITE_OTHER): Payer: Medicare Other | Admitting: Cardiology

## 2015-09-12 VITALS — BP 154/60 | HR 90 | Ht 69.0 in | Wt 332.6 lb

## 2015-09-12 DIAGNOSIS — E785 Hyperlipidemia, unspecified: Secondary | ICD-10-CM | POA: Diagnosis not present

## 2015-09-12 DIAGNOSIS — I2510 Atherosclerotic heart disease of native coronary artery without angina pectoris: Secondary | ICD-10-CM

## 2015-09-12 DIAGNOSIS — I5032 Chronic diastolic (congestive) heart failure: Secondary | ICD-10-CM

## 2015-09-12 NOTE — Patient Instructions (Signed)

## 2015-09-12 NOTE — Telephone Encounter (Signed)
RF request for plavix LOV: 07/08/15 Next ov: 11/09/15 Last written: 10/14/14 #30 w/ 3RF

## 2015-09-14 ENCOUNTER — Other Ambulatory Visit: Payer: Self-pay | Admitting: *Deleted

## 2015-09-14 DIAGNOSIS — M62830 Muscle spasm of back: Secondary | ICD-10-CM

## 2015-09-14 NOTE — Telephone Encounter (Signed)
Pt LMOM on 09/14/15 at 8:33am. He stated that he was prescribed Baclofen by Dr. Raoul Pitch on 06/20/15 for his back pain. He stated that it worked well for him. He stated that he woke up this morning and his back was hurting. He is requesting another refill. Please advise. Thanks.   RF request for Baclofen LOV: 07/08/15 Next ov: 11/09/15 Last written: 06/20/15 #30 w/ 6RF

## 2015-09-15 MED ORDER — BACLOFEN 10 MG PO TABS: 5.0000 mg | ORAL_TABLET | Freq: Three times a day (TID) | ORAL | Status: DC

## 2015-09-15 NOTE — Telephone Encounter (Signed)
Pt advised and voiced understanding.   

## 2015-09-25 ENCOUNTER — Other Ambulatory Visit: Payer: Self-pay | Admitting: Family Medicine

## 2015-09-26 NOTE — Telephone Encounter (Signed)
RF request for torsemide LOV: 07/08/15 Next ov: 11/09/15 Last written: 07/13/14 #90 w/ 4RF

## 2015-09-27 DIAGNOSIS — H18412 Arcus senilis, left eye: Secondary | ICD-10-CM | POA: Diagnosis not present

## 2015-09-27 DIAGNOSIS — Z961 Presence of intraocular lens: Secondary | ICD-10-CM | POA: Diagnosis not present

## 2015-09-27 DIAGNOSIS — H18411 Arcus senilis, right eye: Secondary | ICD-10-CM | POA: Diagnosis not present

## 2015-09-27 DIAGNOSIS — H2512 Age-related nuclear cataract, left eye: Secondary | ICD-10-CM | POA: Diagnosis not present

## 2015-09-27 NOTE — Telephone Encounter (Signed)
This encounter was created in error - please disregard.

## 2015-10-04 ENCOUNTER — Telehealth: Payer: Self-pay | Admitting: Cardiology

## 2015-10-04 NOTE — Telephone Encounter (Signed)
Confirmed with Sharyn Lull that surgery date is 12/9 (2 different dates have been received). Sharyn Lull st surgery is not under general anesthesia, but conscious sedation. Sharyn Lull understands Dr. Radford Pax is out of the office so clearance may not be received until tomorrow.  To Dr. Radford Pax.

## 2015-10-04 NOTE — Telephone Encounter (Signed)
Patient ok to proceed with cataract surgery from cardiac standpoint using conscious sedation.  He has chronically occluded LAD with no angina and this is a low risk procedure.  He also has obesity hypoventilation syndrome on chronic O2 so need to be careful with conscious sedation.

## 2015-10-04 NOTE — Telephone Encounter (Signed)
New MEssage  [name] from [facility] calling to speak w/ RN- stated she has faxed clearance multiple times (most recent yesterday=-10/03/15) Please call back and discuss.     Request for surgical clearance:  1. What type of surgery is being performed? Cataract surgery   2. When is this surgery scheduled? Fri 12/9   3. Are there any medications that need to be held prior to surgery and how long? none   4. Name of physician performing surgery? [facility]  5. What is your office phone and fax number?   6.

## 2015-10-05 NOTE — Telephone Encounter (Signed)
Printed and placed in MR "to be faxed" bin.  To be faxed to [facility] at [phone] attn: Sharyn Lull.

## 2015-10-08 DIAGNOSIS — J449 Chronic obstructive pulmonary disease, unspecified: Secondary | ICD-10-CM | POA: Diagnosis not present

## 2015-10-10 DIAGNOSIS — H2512 Age-related nuclear cataract, left eye: Secondary | ICD-10-CM | POA: Diagnosis not present

## 2015-10-10 DIAGNOSIS — H25812 Combined forms of age-related cataract, left eye: Secondary | ICD-10-CM | POA: Diagnosis not present

## 2015-10-11 DIAGNOSIS — H2512 Age-related nuclear cataract, left eye: Secondary | ICD-10-CM | POA: Diagnosis not present

## 2015-10-17 ENCOUNTER — Telehealth: Payer: Self-pay

## 2015-10-17 DIAGNOSIS — E785 Hyperlipidemia, unspecified: Secondary | ICD-10-CM

## 2015-10-17 MED ORDER — EZETIMIBE 10 MG PO TABS: 10.0000 mg | ORAL_TABLET | Freq: Every day | ORAL | Status: DC

## 2015-10-17 NOTE — Telephone Encounter (Signed)
Zetia 10 mg daily called in to pharmacy of choice. Fasting labs scheduled for January 27, 2016. Patient agrees with treatment plan.

## 2015-10-17 NOTE — Telephone Encounter (Signed)
-----   Message from Aris Georgia, [facility] sent at 08/31/2015 10:47 AM EDT ----- RF: Diabetes, CAD, HTN, low HDL, age - LDL goal < 70, non-HDL goal < 100 Meds: Lipitor 80 mg qd, fish oil 4 gm daily LDL improved with increase of Lipitor from 40mg  to 80mg .  TG have improved as well Non-HDL - 104 mg/dL. LFTs normal.  Would still like to see further improvement in TG.  Given history of CAD, would prefer addition of Zetia based on outcomes data.  Would add Zetia 10mg  daily.  If cost is still too much, it will be generic by end of the year so may need to wait to January to start.  Recheck labs in 4 months again.

## 2015-10-27 ENCOUNTER — Other Ambulatory Visit: Payer: Self-pay | Admitting: Family Medicine

## 2015-10-28 NOTE — Telephone Encounter (Signed)
Lisinopril [facility]

## 2015-10-28 NOTE — Telephone Encounter (Signed)
Pt advised and voiced understanding.   

## 2015-10-28 NOTE — Telephone Encounter (Signed)
RF request for lisinopril LOV: 07/08/15 Next ov: 11/09/15 Last written: 05/09/15 #90 w/ 1RF

## 2015-10-30 DIAGNOSIS — N183 Chronic kidney disease, stage 3 unspecified: Secondary | ICD-10-CM

## 2015-10-30 HISTORY — DX: Chronic kidney disease, stage 3 unspecified: N18.30

## 2015-11-08 DIAGNOSIS — J449 Chronic obstructive pulmonary disease, unspecified: Secondary | ICD-10-CM | POA: Diagnosis not present

## 2015-11-09 ENCOUNTER — Encounter: Payer: Self-pay | Admitting: Family Medicine

## 2015-11-09 ENCOUNTER — Ambulatory Visit (INDEPENDENT_AMBULATORY_CARE_PROVIDER_SITE_OTHER): Payer: Medicare Other | Admitting: Family Medicine

## 2015-11-09 VITALS — BP 113/67 | HR 79 | Temp 98.0°F | Resp 16 | Ht 69.0 in | Wt 331.2 lb

## 2015-11-09 DIAGNOSIS — I8311 Varicose veins of right lower extremity with inflammation: Secondary | ICD-10-CM

## 2015-11-09 DIAGNOSIS — J438 Other emphysema: Secondary | ICD-10-CM

## 2015-11-09 DIAGNOSIS — I872 Venous insufficiency (chronic) (peripheral): Secondary | ICD-10-CM

## 2015-11-09 DIAGNOSIS — E119 Type 2 diabetes mellitus without complications: Secondary | ICD-10-CM

## 2015-11-09 DIAGNOSIS — E785 Hyperlipidemia, unspecified: Secondary | ICD-10-CM

## 2015-11-09 DIAGNOSIS — I5032 Chronic diastolic (congestive) heart failure: Secondary | ICD-10-CM

## 2015-11-09 LAB — BASIC METABOLIC PANEL
BUN: 36 mg/dL — ABNORMAL HIGH (ref 6–23)
CALCIUM: 9.3 mg/dL (ref 8.4–10.5)
CO2: 29 mEq/L (ref 19–32)
CREATININE: 1.3 mg/dL (ref 0.40–1.50)
Chloride: 99 mEq/L (ref 96–112)
GFR: 57.94 mL/min — AB (ref >60.00)
Glucose, Bld: 77 mg/dL (ref 70–99)
Potassium: 4.8 mEq/L (ref 3.5–5.1)
Sodium: 135 mEq/L (ref 135–145)

## 2015-11-09 LAB — HEMOGLOBIN A1C: HEMOGLOBIN A1C: 6.2 % (ref 4.6–6.5)

## 2015-11-09 MED ORDER — FLUTICASONE PROPIONATE 0.05 % EX CREA: TOPICAL_CREAM | CUTANEOUS | Status: DC

## 2015-11-09 MED ORDER — HYDROXYZINE HCL 25 MG PO TABS: ORAL_TABLET | ORAL | Status: DC

## 2015-11-09 NOTE — Progress Notes (Signed)
Pre visit review using our clinic review tool, if applicable. No additional management support is needed unless otherwise documented below in the visit note. 

## 2015-11-09 NOTE — Progress Notes (Signed)
OFFICE VISIT  11/09/2015   CC:  Chief Complaint  Patient presents with  . Follow-up    Pt is fasting.    HPI:    Patient is a 70 y.o. Caucasian male who presents for 4 mo f/u DM 2, HTN, COPD, obesity hypoventilation syndrome.  Cardiologist took him off atorva and switched him to zetia recently.  Tolerating this fine. Says R lower leg "flaring" with skin redness lately.  Not painful but it is itchy.    Fasting glucoses: says typically 110.  2H PP 120s to 170s. Taking 120 U humulin N qAM and 50 U humulin N supper, also taking humalog 24 U with each meal. Taking metformin 1000 mg bid.  Taking hydroxyzine 25mg  around supper and 1 at bedtime and he says he sleeps like a baby.  He has been taking his son's hydroxyzine and he asks if I'll rx this for him.  Says breathing feels stable.   Baseline is DOE with 15-20 steps walking on flat surface.   Wears 2 L oxygen 24/7.  Resting helps breathing very quickly.  ROS: no chest pain, no dizziness, no hematochezia or melena.    Past Medical History  Diagnosis Date  . DIABETES MELLITUS, TYPE II 06/24/2007    No diab retpthy as of 08/05/15 eye exam.  . HYPERLIPIDEMIA 06/24/2007  . HYPERTENSION 05/01/2007  . Hypoxemia 01/27/2010  . OBESITY 09/02/2008  . OSTEOARTHRITIS 05/01/2007  . Chronic diastolic CHF (congestive heart failure) ([place]) 02/25/2009  . Asthma     as a child  . Cataract     multiple types, bilateral  . History of kidney stones   . Pruritic condition 05/2014    Allergist summer 2015, no new testing.  . Dermatitis 05/2014  . Coronary artery disease     chronically occluded LAD and diagonal with right to left collaterals, mild disease in the left circ and moderate disease in the mid RCA on medical management with Imdur, ASA, and Plavix.  . Myocardial infarction [facility])     pt states he was informed per MD that he has had one but pt was unaware   . COPD (chronic obstructive pulmonary disease) ([place])   . Obesity hypoventilation syndrome ([place])      oxygen 24/7  . 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 OSA (obstructive sleep apnea)     not tested; pt scored 4 per stop bang tool results sent to PCP   . Essential hypertension 05/01/2007    Qualifier: Diagnosis of  By: Tiney Rouge CMA, Ellison Hughs     . Presbycusis of both ears 05/2015    [facility]    Past Surgical History  Procedure Laterality Date  . Appendectomy    . Wrist surgery      right fx  . Shoulder surgery      right fx  . Back surgery  2001  . Carpal tunnel release Left   . Cataract extraction w/phaco Right 04/29/2013    Procedure: CATARACT EXTRACTION PHACO AND INTRAOCULAR LENS PLACEMENT (IOC);  Surgeon: Adonis Brook, MD;  Location: [place];  Service: Ophthalmology;  Laterality: Right;  . Colonoscopy w/ polypectomy  08/2011    Many polyps--all hyperplastic, severe diverticulosis, int hem.  BioIQ hemoccult testing via [facility] 06/22/15 NEG  . Left heart catheterization with coronary angiogram N/A 10/26/2013    Procedure: LEFT HEART CATHETERIZATION WITH CORONARY ANGIOGRAM;  Surgeon: Jettie Booze, MD;  Location: [place] CATH LAB;  Service: Cardiovascular;  Laterality: N/A;  . Tonsillectomy    . Cardiac catheterization    . Knee arthroscopy with medial menisectomy Right 12/16/2014    Procedure: RIGHT KNEE ARTHROSCOPY WITH MEDIAL MENISECTOMY microfracture medial femoral condyle abrasion condroplasty medial femoral condyle lateral menisectomy;  Surgeon: Tobi Bastos, MD;  Location: [facility];  Service: Orthopedics;  Laterality: Right;    Outpatient Prescriptions Prior to Visit  Medication Sig Dispense Refill  . aspirin 81 MG tablet Take 81 mg by mouth daily.      . baclofen (LIORESAL) 10 MG tablet Take 0.5 tablets (5 mg total) by mouth 3 (three) times daily. 30 each 3  . beta carotene w/minerals (OCUVITE) tablet Take 1 tablet by mouth daily.    . cetirizine (ZYRTEC) 10 MG tablet Take 10 mg by mouth daily.    . citalopram  (CELEXA) 20 MG tablet TAKE ONE TABLET BY MOUTH ONCE DAILY 30 tablet 11  . clonazePAM (KLONOPIN) 1 MG tablet TAKE ONE TABLET BY MOUTH TWICE DAILY AS NEEDED FOR ANXIETY 60 tablet 5  . clopidogrel (PLAVIX) 75 MG tablet TAKE ONE TABLET BY MOUTH ONCE DAILY WITH BREAKFAST 30 tablet 6  . clotrimazole-betamethasone (LOTRISONE) cream APPLY  CREAM TOPICALLY TO AFFECTED AREA TWICE DAILY 45 g 6  . diltiazem (CARDIZEM CD) 120 MG 24 hr capsule TAKE ONE CAPSULE BY MOUTH ONCE DAILY 30 capsule 5  . ezetimibe (ZETIA) 10 MG tablet Take 1 tablet (10 mg total) by mouth daily. 30 tablet 11  . gabapentin (NEURONTIN) 300 MG capsule TAKE ONE CAPSULE BY MOUTH THREE TIMES DAILY 270 capsule 3  . glucose blood (ONE TOUCH ULTRA TEST) test strip TEST 3 TIMES A DAY AS DIRECTED 100 each 11  . Insulin Lispro, Human, (HUMALOG KWIKPEN) 200 UNIT/ML SOPN Inject 24 Units into the skin 3 (three) times daily before meals. 15 mL 12  . Insulin NPH, Human,, Isophane, (HUMULIN N KWIKPEN) 100 UNIT/ML Kiwkpen 120 U SQ with BF and 50 U SQ with Supper (Patient taking differently: Inject 50-120 Units into the skin 2 (two) times daily. 120 U SQ with BF and 50 U SQ with Supper) 60 mL 12  . isosorbide mononitrate (IMDUR) 30 MG 24 hr tablet TAKE ONE TABLET BY MOUTH ONCE DAILY 90 tablet 3  . lisinopril (PRINIVIL,ZESTRIL) 40 MG tablet TAKE ONE TABLET BY MOUTH ONCE DAILY 90 tablet 3  . metFORMIN (GLUCOPHAGE) 1000 MG tablet TAKE ONE TABLET BY MOUTH TWICE DAILY WITH  MEALS 60 tablet 6  . Omega-3 Fatty Acids ([employer] FISH OIL) 1000 MG CAPS Take 2 tablets by mouth 2 (two) times daily. 90 capsule   . ONETOUCH DELICA LANCETS 99991111 MISC 1 each by Other route 3 (three) times daily. USE TO TEST 3 TIMES A DAY 100 each 11  . torsemide (DEMADEX) 20 MG tablet TAKE ONE TABLET BY MOUTH ONCE DAILY 90 tablet 1  . atorvastatin (LIPITOR) 80 MG tablet Take 1 tablet (80 mg total) by mouth daily. At 6pm. (Patient not taking: Reported on 11/09/2015) 90 tablet 3   No  facility-administered medications prior to visit.    No Known Allergies  ROS As per HPI  PE: Blood pressure 113/67, pulse 79, temperature 98 F (36.7 C), temperature source Oral, resp. rate 16, height 5\' 9"  (1.753 m), weight 331 lb 4 oz (150.254 kg), SpO2 94 %. His weight is stable compared to last exam here. CV: RRR, no m/r/g.  S1 and S2 are distant. Chest is clear,  no wheezing or rales. Normal symmetric air entry throughout both lung fields. No chest wall deformities or tenderness. EXT: R lower leg pretibial region with some erythematous splotchy macular rash with a few excoriations. No tenderness. Thickened skin present in both pretibial regions, with scarring from recurrent stasis dermatitis on L pretib region.  1+ bilat LL edema present.  No cyanosis.  LABS:  Lab Results  Component Value Date   TSH 3.04 08/30/2010   Lab Results  Component Value Date   WBC 7.5 12/08/2014   HGB 12.7* 12/08/2014   HCT 41.9 12/08/2014   MCV 89.0 12/08/2014   PLT 194 12/08/2014   Lab Results  Component Value Date   CREATININE 1.07 07/01/2015   BUN 25 07/01/2015   NA 137 07/01/2015   K 4.9 07/01/2015   CL 97* 07/01/2015   CO2 30 07/01/2015   Lab Results  Component Value Date   ALT 26 08/30/2015   AST 21 08/30/2015   ALKPHOS 44 08/30/2015   BILITOT 0.5 08/30/2015   Lab Results  Component Value Date   CHOL 132 08/30/2015   Lab Results  Component Value Date   HDL 28* 08/30/2015   Lab Results  Component Value Date   LDLCALC 36 08/30/2015   Lab Results  Component Value Date   TRIG 341* 08/30/2015   Lab Results  Component Value Date   CHOLHDL 4.7 08/30/2015   Lab Results  Component Value Date   HGBA1C 6.4 07/08/2015    IMPRESSION AND PLAN:  1) DM 2; stable. HbA1c today.  2) COPD: The current medical regimen is effective;  continue present plan and medications.  3) Chronic diastolic CHF: The current medical regimen is effective;  continue present plan and  medications.  4) R lower ext/pretibial region with stasis dermatitis.  No sign of cellulities at this time. Cutivate 0.05% cream bid rx'd to apply to this region.  5) Hyperlipidemia: per cardiologist--switched from atorv to zetia recently and has plans for f/u lipid testing with cardiologist.  An After Visit Summary was printed and given to the patient.  FOLLOW UP: Return in about 4 months (around 03/08/2016) for routine chronic illness f/u.

## 2015-12-03 ENCOUNTER — Other Ambulatory Visit: Payer: Self-pay | Admitting: Family Medicine

## 2015-12-05 NOTE — Telephone Encounter (Signed)
RF request for Humalog LOV: 11/09/15 Next ov: 03/08/16 Last written: 10/26/14 71mL w/ 12RF

## 2015-12-09 DIAGNOSIS — J449 Chronic obstructive pulmonary disease, unspecified: Secondary | ICD-10-CM | POA: Diagnosis not present

## 2015-12-13 ENCOUNTER — Other Ambulatory Visit: Payer: Self-pay | Admitting: Family Medicine

## 2015-12-13 NOTE — Telephone Encounter (Signed)
RF request for Humulin LOV: 11/09/15 Next ov: 03/08/16 Last written: 11/09/14 #55mL w/ 12RF

## 2015-12-28 DIAGNOSIS — E875 Hyperkalemia: Secondary | ICD-10-CM

## 2015-12-28 HISTORY — DX: Hyperkalemia: E87.5

## 2016-01-01 ENCOUNTER — Other Ambulatory Visit: Payer: Self-pay | Admitting: Family Medicine

## 2016-01-02 ENCOUNTER — Other Ambulatory Visit: Payer: Self-pay | Admitting: *Deleted

## 2016-01-03 NOTE — Telephone Encounter (Signed)
2 rf rq. rf for one of them is a controled.  LOV: 11/09/2015  Both meds pended for review.

## 2016-01-06 DIAGNOSIS — J449 Chronic obstructive pulmonary disease, unspecified: Secondary | ICD-10-CM | POA: Diagnosis not present

## 2016-01-17 ENCOUNTER — Other Ambulatory Visit: Payer: Self-pay | Admitting: Family Medicine

## 2016-01-17 NOTE — Telephone Encounter (Signed)
RF request for metformin LOV: 11/09/15 Next ov: 03/08/16 Last written: 04/26/15 #60 w/ 6RF

## 2016-01-26 ENCOUNTER — Ambulatory Visit (INDEPENDENT_AMBULATORY_CARE_PROVIDER_SITE_OTHER): Payer: Medicare Other | Admitting: Family Medicine

## 2016-01-26 ENCOUNTER — Encounter: Payer: Self-pay | Admitting: Family Medicine

## 2016-01-26 VITALS — BP 103/86 | HR 104 | Temp 98.1°F | Resp 20 | Ht 69.0 in | Wt 327.5 lb

## 2016-01-26 DIAGNOSIS — R252 Cramp and spasm: Secondary | ICD-10-CM | POA: Diagnosis not present

## 2016-01-26 LAB — BASIC METABOLIC PANEL
BUN: 32 mg/dL — AB (ref 6–23)
CO2: 29 mEq/L (ref 19–32)
CREATININE: 1.21 mg/dL (ref 0.40–1.50)
Calcium: 9.4 mg/dL (ref 8.4–10.5)
Chloride: 100 mEq/L (ref 96–112)
GFR: 62.9 mL/min (ref >60.00)
Glucose, Bld: 141 mg/dL — ABNORMAL HIGH (ref 70–99)
Potassium: 5.4 mEq/L — ABNORMAL HIGH (ref 3.5–5.1)
Sodium: 137 mEq/L (ref 135–145)

## 2016-01-26 LAB — MAGNESIUM: MAGNESIUM: 1.6 mg/dL (ref 1.5–2.5)

## 2016-01-26 NOTE — Progress Notes (Signed)
OFFICE VISIT  01/26/2016   CC:  Chief Complaint  Patient presents with  . Leg Pain    left leg x 4 days    HPI:    Patient is a 70 y.o. Caucasian male who presents for onset of left leg pain in middle of sleep 4 nights ago, also happened the following night.   Points to side/back of L hamstring region from the level of the knee up to level of prox hamstring. The pain eased up when he got his leg propped on the edge of the bed.  Last night he slept in his compression stockings and he propped his feet up for a short period and the cramps didn't come.    His leg has neither swollen nor turned red.  During the daytime (and during the remainder of the nights) his leg has felt completely normal. He has taken baclofen for back muscle spasms in the past but has not refilled it.  He has never taken it for this type of cramping before.  No recent changes in his chronic meds.    Past Medical History  Diagnosis Date  . DIABETES MELLITUS, TYPE II 06/24/2007    No diab retpthy as of 08/05/15 eye exam.  . HYPERLIPIDEMIA 06/24/2007  . HYPERTENSION 05/01/2007  . Hypoxemia 01/27/2010  . OBESITY 09/02/2008  . OSTEOARTHRITIS 05/01/2007  . Chronic diastolic CHF (congestive heart failure) ([place]) 02/25/2009  . Asthma     as a child  . Cataract     multiple types, bilateral  . History of kidney stones   . Pruritic condition 05/2014    Allergist summer 2015, no new testing.  . Dermatitis 05/2014  . Coronary artery disease     chronically occluded LAD and diagonal with right to left collaterals, mild disease in the left circ and moderate disease in the mid RCA on medical management with Imdur, ASA, and Plavix.  . Myocardial infarction [facility])     pt states he was informed per MD that he has had one but pt was unaware   . COPD (chronic obstructive pulmonary disease) ([place])   . Obesity hypoventilation syndrome ([place])     oxygen 24/7  . 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 OSA (obstructive sleep apnea)     not tested; pt scored 4 per stop bang tool results sent to PCP   . Essential hypertension 05/01/2007    Qualifier: Diagnosis of  By: Tiney Rouge CMA, Ellison Hughs     . Presbycusis of both ears 05/2015    [facility]  . Chronic renal insufficiency, stage III (moderate) 2017    Stage II/III (GFR around 60)    Past Surgical History  Procedure Laterality Date  . Appendectomy    . Wrist surgery      right fx  . Shoulder surgery      right fx  . Back surgery  2001  . Carpal tunnel release Left   . Cataract extraction w/phaco Right 04/29/2013    Procedure: CATARACT EXTRACTION PHACO AND INTRAOCULAR LENS PLACEMENT (IOC);  Surgeon: Adonis Brook, MD;  Location: [place];  Service: Ophthalmology;  Laterality: Right;  . Colonoscopy w/ polypectomy  08/2011    Many polyps--all hyperplastic, severe diverticulosis, int hem.  BioIQ hemoccult testing via [facility] 06/22/15 NEG  . Left heart catheterization with coronary angiogram N/A 10/26/2013    Procedure: LEFT HEART CATHETERIZATION WITH CORONARY ANGIOGRAM;  Surgeon: Jettie Booze, MD;  Location: [facility] CATH LAB;  Service: Cardiovascular;  Laterality: N/A;  . Tonsillectomy    . Cardiac catheterization    . Knee arthroscopy with medial menisectomy Right 12/16/2014    Procedure: RIGHT KNEE ARTHROSCOPY WITH MEDIAL MENISECTOMY microfracture medial femoral condyle abrasion condroplasty medial femoral condyle lateral menisectomy;  Surgeon: Tobi Bastos, MD;  Location: [facility];  Service: Orthopedics;  Laterality: Right;    Outpatient Prescriptions Prior to Visit  Medication Sig Dispense Refill  . aspirin 81 MG tablet Take 81 mg by mouth daily.      . baclofen (LIORESAL) 10 MG tablet Take 0.5 tablets (5 mg total) by mouth 3 (three) times daily. 30 each 3  . beta carotene w/minerals (OCUVITE) tablet Take 1 tablet by mouth daily.    . cetirizine (ZYRTEC) 10 MG tablet Take 10 mg by mouth daily.    . citalopram (CELEXA) 20  MG tablet Take 1 tablet (20 mg total) by mouth daily. 90 tablet 3  . clonazePAM (KLONOPIN) 1 MG tablet Take 1 tablet (1 mg total) by mouth 2 (two) times daily as needed for anxiety. 60 tablet 5  . clopidogrel (PLAVIX) 75 MG tablet TAKE ONE TABLET BY MOUTH ONCE DAILY WITH BREAKFAST 30 tablet 6  . clotrimazole-betamethasone (LOTRISONE) cream APPLY  CREAM TOPICALLY TO AFFECTED AREA TWICE DAILY 45 g 6  . diltiazem (CARDIZEM CD) 120 MG 24 hr capsule TAKE ONE CAPSULE BY MOUTH ONCE DAILY 30 capsule 5  . ezetimibe (ZETIA) 10 MG tablet Take 1 tablet (10 mg total) by mouth daily. 30 tablet 11  . fluticasone (CUTIVATE) 0.05 % cream Apply to lower leg rash bid prn 30 g 1  . gabapentin (NEURONTIN) 300 MG capsule TAKE ONE CAPSULE BY MOUTH THREE TIMES DAILY 270 capsule 3  . glucose blood (ONE TOUCH ULTRA TEST) test strip TEST 3 TIMES A DAY AS DIRECTED 100 each 11  . HUMALOG KWIKPEN 200 UNIT/ML SOPN INJECT 24 UNITS SUBCUTANEOUSLY THREE TIMES DAILY BEFORE MEALS 15 mL 6  . HUMULIN N KWIKPEN 100 UNIT/ML Kiwkpen INJECT 120 UNITS SUBCUTANEOUSLY WITH BREAKFAST AND 50 UNITS SUBCUTANEOUSLY WITH SUPPER 60 mL 6  . hydrOXYzine (ATARAX/VISTARIL) 25 MG tablet 1 tab at supper and 1 at bedtime (for insomnia) 60 tablet 11  . isosorbide mononitrate (IMDUR) 30 MG 24 hr tablet TAKE ONE TABLET BY MOUTH ONCE DAILY 90 tablet 3  . lisinopril (PRINIVIL,ZESTRIL) 40 MG tablet TAKE ONE TABLET BY MOUTH ONCE DAILY 90 tablet 3  . metFORMIN (GLUCOPHAGE) 1000 MG tablet TAKE ONE TABLET BY MOUTH TWICE DAILY WITH MEALS 60 tablet 6  . Omega-3 Fatty Acids ([employer] FISH OIL) 1000 MG CAPS Take 2 tablets by mouth 2 (two) times daily. 90 capsule   . ONETOUCH DELICA LANCETS 99991111 MISC 1 each by Other route 3 (three) times daily. USE TO TEST 3 TIMES A DAY 100 each 11  . torsemide (DEMADEX) 20 MG tablet TAKE ONE TABLET BY MOUTH ONCE DAILY 90 tablet 1   No facility-administered medications prior to visit.    No Known Allergies  ROS As per HPI  PE: Blood  pressure 103/86, pulse 104, temperature 98.1 F (36.7 C), temperature source Oral, resp. rate 20, height 5\' 9"  (1.753 m), weight 327 lb 8 oz (148.553 kg), SpO2 94 %.Wearing 2L oxygen Gen: Alert, well appearing.  Patient is oriented to person, place, time, and situation. LEGS: LL's with 1+ pitting edema from mid tibia level down into ankles/feet.  No tenderness, erythema,  or asymmetry. No tenderness in popliteal fossa, thigh, or hamstring region on either side.   LABS:  Lab Results  Component Value Date   TSH 3.04 08/30/2010   Lab Results  Component Value Date   WBC 7.5 12/08/2014   HGB 12.7* 12/08/2014   HCT 41.9 12/08/2014   MCV 89.0 12/08/2014   PLT 194 12/08/2014   Lab Results  Component Value Date   CREATININE 1.30 11/09/2015   BUN 36* 11/09/2015   NA 135 11/09/2015   K 4.8 11/09/2015   CL 99 11/09/2015   CO2 29 11/09/2015   Lab Results  Component Value Date   ALT 26 08/30/2015   AST 21 08/30/2015   ALKPHOS 44 08/30/2015   BILITOT 0.5 08/30/2015   Lab Results  Component Value Date   CHOL 132 08/30/2015   Lab Results  Component Value Date   HDL 28* 08/30/2015   Lab Results  Component Value Date   LDLCALC 36 08/30/2015   Lab Results  Component Value Date   TRIG 341* 08/30/2015   Lab Results  Component Value Date   CHOLHDL 4.7 08/30/2015   IMPRESSION AND PLAN:  1) Nocturnal muscle cramps: discussed use of tonic water 2-3 oz prior to bed, may use 1-2 tsp yellow mustard prn cramps.  I recommended against him using baclofen for this and he said that was fine. Will check BMET and Mag levels today. Reassured pt that this does not sound like diabetic neuropathy, which is what he was afraid of.  An After Visit Summary was printed and given to the patient.  FOLLOW UP: Return for keep appt scheduled for May 2017.  Signed:  Crissie Sickles, MD           01/26/2016

## 2016-01-26 NOTE — Progress Notes (Signed)
Pre visit review using our clinic review tool, if applicable. No additional management support is needed unless otherwise documented below in the visit note. 

## 2016-01-27 ENCOUNTER — Other Ambulatory Visit: Payer: Self-pay | Admitting: *Deleted

## 2016-01-27 ENCOUNTER — Encounter: Payer: Self-pay | Admitting: Family Medicine

## 2016-01-27 ENCOUNTER — Other Ambulatory Visit (INDEPENDENT_AMBULATORY_CARE_PROVIDER_SITE_OTHER): Payer: Medicare Other | Admitting: *Deleted

## 2016-01-27 DIAGNOSIS — E875 Hyperkalemia: Secondary | ICD-10-CM

## 2016-01-27 DIAGNOSIS — E785 Hyperlipidemia, unspecified: Secondary | ICD-10-CM

## 2016-01-27 LAB — LIPID PANEL
CHOL/HDL RATIO: 8.6 ratio — AB (ref <=5.0)
Cholesterol: 267 mg/dL — ABNORMAL HIGH (ref 125–200)
HDL: 31 mg/dL — AB (ref >=40)
Triglycerides: 584 mg/dL — ABNORMAL HIGH (ref <150)

## 2016-01-27 LAB — ALT: ALT: 28 U/L (ref 9–46)

## 2016-01-27 NOTE — Addendum Note (Signed)
Addended by: Eulis Foster on: 01/27/2016 07:34 AM   Modules accepted: Orders

## 2016-01-31 ENCOUNTER — Other Ambulatory Visit: Payer: Self-pay | Admitting: *Deleted

## 2016-01-31 DIAGNOSIS — Z79899 Other long term (current) drug therapy: Secondary | ICD-10-CM

## 2016-01-31 DIAGNOSIS — E785 Hyperlipidemia, unspecified: Secondary | ICD-10-CM

## 2016-01-31 MED ORDER — ATORVASTATIN CALCIUM 80 MG PO TABS: 80.0000 mg | ORAL_TABLET | Freq: Every day | ORAL | Status: DC

## 2016-02-06 DIAGNOSIS — J449 Chronic obstructive pulmonary disease, unspecified: Secondary | ICD-10-CM | POA: Diagnosis not present

## 2016-02-07 ENCOUNTER — Telehealth: Payer: Self-pay | Admitting: *Deleted

## 2016-02-07 NOTE — Telephone Encounter (Signed)
Confirmed with patient he should be taking Lipitor 80 mg daily along with his Zetia. Confirmed fasting lab appointment 7/5. Patient was grateful for call.

## 2016-02-07 NOTE — Telephone Encounter (Signed)
Patient wants to know if he should take 40 Atorvastatin with Zetia 10 or 80 Atorvastatin with Zetia 10. Please advise

## 2016-02-09 ENCOUNTER — Other Ambulatory Visit (INDEPENDENT_AMBULATORY_CARE_PROVIDER_SITE_OTHER): Payer: Medicare Other

## 2016-02-09 ENCOUNTER — Encounter: Payer: Self-pay | Admitting: Family Medicine

## 2016-02-09 DIAGNOSIS — E875 Hyperkalemia: Secondary | ICD-10-CM | POA: Diagnosis not present

## 2016-02-09 DIAGNOSIS — Z79899 Other long term (current) drug therapy: Secondary | ICD-10-CM

## 2016-02-09 DIAGNOSIS — E785 Hyperlipidemia, unspecified: Secondary | ICD-10-CM

## 2016-02-09 LAB — BASIC METABOLIC PANEL
BUN: 20 mg/dL (ref 6–23)
CHLORIDE: 97 meq/L (ref 96–112)
CO2: 30 mEq/L (ref 19–32)
Calcium: 9.2 mg/dL (ref 8.4–10.5)
Creatinine, Ser: 1 mg/dL (ref 0.40–1.50)
GFR: 78.37 mL/min (ref >60.00)
Glucose, Bld: 111 mg/dL — ABNORMAL HIGH (ref 70–99)
POTASSIUM: 4.5 meq/L (ref 3.5–5.1)
SODIUM: 137 meq/L (ref 135–145)

## 2016-02-10 ENCOUNTER — Telehealth: Payer: Self-pay | Admitting: Cardiology

## 2016-02-10 LAB — HEPATIC FUNCTION PANEL

## 2016-02-10 LAB — LIPID PANEL

## 2016-02-10 NOTE — Telephone Encounter (Signed)
BMET normal

## 2016-02-13 NOTE — Telephone Encounter (Signed)
Patient made aware by PCP.

## 2016-02-23 ENCOUNTER — Other Ambulatory Visit: Payer: Self-pay | Admitting: *Deleted

## 2016-02-23 MED ORDER — ONETOUCH DELICA LANCETS 33G MISC
1.0000 | Freq: Three times a day (TID) | Status: DC
Start: 2016-02-23 — End: 2016-08-31

## 2016-02-23 MED ORDER — GLUCOSE BLOOD VI STRP
ORAL_STRIP | Status: DC
Start: 2016-02-23 — End: 2016-08-31

## 2016-02-23 NOTE — Telephone Encounter (Signed)
Pt LMOM on 02/23/16 at 9:24am requesting refill for DM supplies (test strips and lancets).   LOV: 11/09/15 NOV: 03/08/16  Rx's sent to pharmacy.  Pt advised and voiced understanding.

## 2016-03-07 DIAGNOSIS — J449 Chronic obstructive pulmonary disease, unspecified: Secondary | ICD-10-CM | POA: Diagnosis not present

## 2016-03-08 ENCOUNTER — Encounter: Payer: Self-pay | Admitting: Family Medicine

## 2016-03-08 ENCOUNTER — Ambulatory Visit (INDEPENDENT_AMBULATORY_CARE_PROVIDER_SITE_OTHER): Payer: Medicare Other | Admitting: Family Medicine

## 2016-03-08 VITALS — BP 101/61 | HR 80 | Temp 98.4°F | Resp 16 | Ht 69.0 in | Wt 318.5 lb

## 2016-03-08 DIAGNOSIS — J438 Other emphysema: Secondary | ICD-10-CM

## 2016-03-08 DIAGNOSIS — Z1159 Encounter for screening for other viral diseases: Secondary | ICD-10-CM

## 2016-03-08 DIAGNOSIS — I10 Essential (primary) hypertension: Secondary | ICD-10-CM

## 2016-03-08 DIAGNOSIS — E118 Type 2 diabetes mellitus with unspecified complications: Secondary | ICD-10-CM | POA: Diagnosis not present

## 2016-03-08 DIAGNOSIS — E662 Morbid (severe) obesity with alveolar hypoventilation: Secondary | ICD-10-CM

## 2016-03-08 LAB — BASIC METABOLIC PANEL
BUN: 24 mg/dL — ABNORMAL HIGH (ref 6–23)
CALCIUM: 9.3 mg/dL (ref 8.4–10.5)
CO2: 31 meq/L (ref 19–32)
Chloride: 99 mEq/L (ref 96–112)
Creatinine, Ser: 1.08 mg/dL (ref 0.40–1.50)
GFR: 71.7 mL/min (ref >60.00)
Glucose, Bld: 65 mg/dL — ABNORMAL LOW (ref 70–99)
POTASSIUM: 5.1 meq/L (ref 3.5–5.1)
SODIUM: 138 meq/L (ref 135–145)

## 2016-03-08 LAB — HEMOGLOBIN A1C: HEMOGLOBIN A1C: 6.1 % (ref 4.6–6.5)

## 2016-03-08 NOTE — Progress Notes (Signed)
OFFICE VISIT  03/08/2016   CC:  Chief Complaint  Patient presents with  . Follow-up    Pt is fasting.      HPI:    Patient is a 70 y.o. Caucasian male who presents for 4 mo f/u DM 2, HTN, COPD, obesity hypoventilation syndrome.  HLD: back on atorv 80 in addition to his zetia and feels no side effects. Plans for repeat FLP in July as per cardiology.  Says breathing feels good, wears 2L oxygen 24/7.  Hasn't had need for a breathing treatment "in a long time".  Takes bp meds faithfully, but has no way to monitor bp at home.  DM: "if I eat right they're pretty good.   Fasting 120-140 Gives 120 Humulin N in morning and 50 in afternoon.  Gives humalog 24 U with each meal.  ROS: no CP, no palpitations, no increase in LE swelling, no myalgias, no rash.   Past Medical History  Diagnosis Date  . DIABETES MELLITUS, TYPE II 06/24/2007    No diab retpthy as of 08/05/15 eye exam.  . HYPERLIPIDEMIA 06/24/2007  . HYPERTENSION 05/01/2007  . Hypoxemia 01/27/2010  . OBESITY 09/02/2008  . OSTEOARTHRITIS 05/01/2007  . Chronic diastolic CHF (congestive heart failure) ([place]) 02/25/2009  . Asthma     as a child  . Cataract     multiple types, bilateral  . History of kidney stones   . Pruritic condition 05/2014    Allergist summer 2015, no new testing.  . Dermatitis 05/2014  . Coronary artery disease     chronically occluded LAD and diagonal with right to left collaterals, mild disease in the left circ and moderate disease in the mid RCA on medical management with Imdur, ASA, and Plavix.  . Myocardial infarction [facility])     pt states he was informed per MD that he has had one but pt was unaware   . COPD (chronic obstructive pulmonary disease) ([place])   . Obesity hypoventilation syndrome ([place])     oxygen 24/7  . 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 OSA (obstructive sleep apnea)     not tested; pt scored 4 per stop bang tool results sent  to PCP   . Essential hypertension 05/01/2007    Qualifier: Diagnosis of  By: Tiney Rouge CMA, Ellison Hughs     . Presbycusis of both ears 05/2015    [facility]  . Chronic renal insufficiency, stage III (moderate) 2017    Stage II/III (GFR around 60)  . Hyperkalemia 12/2015    Decreased ACE-I by 50% in response, then potassium normalized.    Past Surgical History  Procedure Laterality Date  . Appendectomy    . Wrist surgery      right fx  . Shoulder surgery      right fx  . Back surgery  2001  . Carpal tunnel release Left   . Cataract extraction w/phaco Right 04/29/2013    Procedure: CATARACT EXTRACTION PHACO AND INTRAOCULAR LENS PLACEMENT (IOC);  Surgeon: Adonis Brook, MD;  Location: [name];  Service: Ophthalmology;  Laterality: Right;  . Colonoscopy w/ polypectomy  08/2011    Many polyps--all hyperplastic, severe diverticulosis, int hem.  BioIQ hemoccult testing via [facility] 06/22/15 NEG  . Left heart catheterization with coronary angiogram N/A 10/26/2013    Procedure: LEFT HEART CATHETERIZATION WITH CORONARY ANGIOGRAM;  Surgeon: Jettie Booze, MD;  Location: [facility] CATH LAB;  Service:  Cardiovascular;  Laterality: N/A;  . Tonsillectomy    . Cardiac catheterization    . Knee arthroscopy with medial menisectomy Right 12/16/2014    Procedure: RIGHT KNEE ARTHROSCOPY WITH MEDIAL MENISECTOMY microfracture medial femoral condyle abrasion condroplasty medial femoral condyle lateral menisectomy;  Surgeon: Tobi Bastos, MD;  Location: [facility];  Service: Orthopedics;  Laterality: Right;    Outpatient Prescriptions Prior to Visit  Medication Sig Dispense Refill  . aspirin 81 MG tablet Take 81 mg by mouth daily.      Marland Kitchen atorvastatin (LIPITOR) 80 MG tablet Take 1 tablet (80 mg total) by mouth daily. 90 tablet 3  . baclofen (LIORESAL) 10 MG tablet Take 0.5 tablets (5 mg total) by mouth 3 (three) times daily. 30 each 3  . beta carotene w/minerals (OCUVITE) tablet Take 1 tablet by mouth daily.    . cetirizine  (ZYRTEC) 10 MG tablet Take 10 mg by mouth daily.    . citalopram (CELEXA) 20 MG tablet Take 1 tablet (20 mg total) by mouth daily. 90 tablet 3  . clonazePAM (KLONOPIN) 1 MG tablet Take 1 tablet (1 mg total) by mouth 2 (two) times daily as needed for anxiety. 60 tablet 5  . clopidogrel (PLAVIX) 75 MG tablet TAKE ONE TABLET BY MOUTH ONCE DAILY WITH BREAKFAST 30 tablet 6  . clotrimazole-betamethasone (LOTRISONE) cream APPLY  CREAM TOPICALLY TO AFFECTED AREA TWICE DAILY 45 g 6  . diltiazem (CARDIZEM CD) 120 MG 24 hr capsule TAKE ONE CAPSULE BY MOUTH ONCE DAILY 30 capsule 5  . ezetimibe (ZETIA) 10 MG tablet Take 1 tablet (10 mg total) by mouth daily. 30 tablet 11  . fluticasone (CUTIVATE) 0.05 % cream Apply to lower leg rash bid prn 30 g 1  . gabapentin (NEURONTIN) 300 MG capsule TAKE ONE CAPSULE BY MOUTH THREE TIMES DAILY 270 capsule 3  . glucose blood (ONE TOUCH ULTRA TEST) test strip TEST 3 TIMES A DAY AS DIRECTED 100 each 11  . HUMALOG KWIKPEN 200 UNIT/ML SOPN INJECT 24 UNITS SUBCUTANEOUSLY THREE TIMES DAILY BEFORE MEALS 15 mL 6  . HUMULIN N KWIKPEN 100 UNIT/ML Kiwkpen INJECT 120 UNITS SUBCUTANEOUSLY WITH BREAKFAST AND 50 UNITS SUBCUTANEOUSLY WITH SUPPER 60 mL 6  . hydrOXYzine (ATARAX/VISTARIL) 25 MG tablet 1 tab at supper and 1 at bedtime (for insomnia) 60 tablet 11  . isosorbide mononitrate (IMDUR) 30 MG 24 hr tablet TAKE ONE TABLET BY MOUTH ONCE DAILY 90 tablet 3  . lisinopril (PRINIVIL,ZESTRIL) 40 MG tablet TAKE ONE TABLET BY MOUTH ONCE DAILY 90 tablet 3  . metFORMIN (GLUCOPHAGE) 1000 MG tablet TAKE ONE TABLET BY MOUTH TWICE DAILY WITH MEALS 60 tablet 6  . Omega-3 Fatty Acids ([employer] FISH OIL) 1000 MG CAPS Take 2 tablets by mouth 2 (two) times daily. 90 capsule   . ONETOUCH DELICA LANCETS 99991111 MISC 1 each by Other route 3 (three) times daily. USE TO TEST 3 TIMES A DAY 100 each 11  . torsemide (DEMADEX) 20 MG tablet TAKE ONE TABLET BY MOUTH ONCE DAILY 90 tablet 1   No facility-administered  medications prior to visit.    No Known Allergies  ROS As per HPI  PE: Blood pressure 101/61, pulse 80, temperature 98.4 F (36.9 C), temperature source Oral, resp. rate 16, height 5\' 9"  (1.753 m), weight 318 lb 8 oz (144.471 kg), SpO2 96 %. Gen: Alert, well appearing.  Patient is oriented to person, place, time, and situation. CV: RRR, no m/r/g.   LUNGS: CTA bilat, nonlabored resps, good  aeration in all lung fields. EXT: R LL circumference larger than L LL---as per his usual, but the pitting edema is 1+ in pretibial region bilat. No skin erythema or breakdown.  LABS:  Lab Results  Component Value Date   TSH 3.04 08/30/2010   Lab Results  Component Value Date   WBC 7.5 12/08/2014   HGB 12.7* 12/08/2014   HCT 41.9 12/08/2014   MCV 89.0 12/08/2014   PLT 194 12/08/2014   Lab Results  Component Value Date   CREATININE 1.00 02/09/2016   BUN 20 02/09/2016   NA 137 02/09/2016   K 4.5 02/09/2016   CL 97 02/09/2016   CO2 30 02/09/2016   Lab Results  Component Value Date   ALT CANCELED 02/09/2016   AST CANCELED 02/09/2016   ALKPHOS CANCELED 02/09/2016   BILITOT CANCELED 02/09/2016   Lab Results  Component Value Date   CHOL CANCELED 02/09/2016   Lab Results  Component Value Date   HDL CANCELED 02/09/2016   Lab Results  Component Value Date   LDLCALC NOT CALC 02/09/2016   Lab Results  Component Value Date   TRIG CANCELED 02/09/2016   Lab Results  Component Value Date   CHOLHDL CANCELED 02/09/2016   Lab Results  Component Value Date   HGBA1C 6.2 11/09/2015   IMPRESSION AND PLAN:  1) DM 2: control historically good. Check HbA1c today.  2) HTN-The current medical regimen is effective;  continue present plan and medications. Check lytes/cr today.  3) COPD with Obesity hypoventilation syndrome: stable.  Continue 24/7 oxygen 2L via nasal cannulae.  4) Preventative health care: screened for Hep C today.  An After Visit Summary was printed and given to  the patient.  FOLLOW UP: Return in about 4 months (around 07/09/2016) for annual CPE (fasting).  Signed:  Crissie Sickles, MD           03/08/2016

## 2016-03-08 NOTE — Progress Notes (Signed)
Pre visit review using our clinic review tool, if applicable. No additional management support is needed unless otherwise documented below in the visit note. 

## 2016-03-09 LAB — HEPATITIS C ANTIBODY: HCV AB: NEGATIVE

## 2016-03-11 NOTE — Progress Notes (Signed)
Cardiology Office Note    Date:  03/12/2016   ID:  [name], DOB 11/25/1945, MRN [id number]  PCP:  Tammi Sou, MD  Cardiologist:  Fransico Him, MD   Chief Complaint  Patient presents with  . Coronary Artery Disease  . Hypertension  . Congestive Heart Failure    History of Present Illness:  [name] is a 70 y.o. male with a history of obesity hypoventilation syndrome on home O2, HTN, dyslipidemia, morbid obesity and chronic LE edema which is controlled with TED hose stockings and diuretic. He has chronic diastolic CHF controlled on diuretics. He has ASCAD with severely diseased and chronically occluded LAD and diagonal with right to left collaterals, mild disease in the left circ and moderate disease in the mid RCA on medical management with Imdur and Plavix. He presents back today for followup. He is doing well. He has chronic SOB related to his underlying obesity hypoventilation and is on home O2 and he thinks his DOE is stable. He has chronic LE edema that is stable.  He denies any chest pain, dizziness, palpitations or syncope. It was felt in the past that he may have OSA but has refused sleep study.     Past Medical History  Diagnosis Date  . DIABETES MELLITUS, TYPE II 06/24/2007    No diab retpthy as of 08/05/15 eye exam.  . HYPERLIPIDEMIA 06/24/2007  . HYPERTENSION 05/01/2007  . Hypoxemia 01/27/2010  . OBESITY 09/02/2008  . OSTEOARTHRITIS 05/01/2007  . Chronic diastolic CHF (congestive heart failure) ([place]) 02/25/2009  . Asthma     as a child  . Cataract     multiple types, bilateral  . History of kidney stones   . Pruritic condition 05/2014    Allergist summer 2015, no new testing.  . Dermatitis 05/2014  . Coronary artery disease     chronically occluded LAD and diagonal with right to left collaterals, mild disease in the left circ and moderate disease in the mid RCA on medical management with Imdur, ASA, and Plavix.  . Myocardial infarction [facility])       pt states he was informed per MD that he has had one but pt was unaware   . COPD (chronic obstructive pulmonary disease) ([place])   . Obesity hypoventilation syndrome ([facility])     oxygen 24/7 (2 liters [place] as of 02/2016)  . 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 OSA (obstructive sleep apnea)     not tested; pt scored 4 per stop bang tool results sent to PCP   . Essential hypertension 05/01/2007    Qualifier: Diagnosis of  By: Tiney Rouge CMA, Ellison Hughs     . Presbycusis of both ears 05/2015    [facility]  . Chronic renal insufficiency, stage III (moderate) 2017    Stage II/III (GFR around 60)  . Hyperkalemia 12/2015    Decreased ACE-I by 50% in response, then potassium normalized.    Past Surgical History  Procedure Laterality Date  . Appendectomy    . Wrist surgery      right fx  . Shoulder surgery      right fx  . Back surgery  2001  . Carpal tunnel release Left   . Cataract extraction w/phaco Right 04/29/2013    Procedure: CATARACT EXTRACTION PHACO AND INTRAOCULAR LENS PLACEMENT (IOC);  Surgeon: Adonis Brook, MD;  Location: [place];  Service: Ophthalmology;  Laterality: Right;  .  Colonoscopy w/ polypectomy  08/2011    Many polyps--all hyperplastic, severe diverticulosis, int hem.  BioIQ hemoccult testing via [facility] 06/22/15 NEG  . Left heart catheterization with coronary angiogram N/A 10/26/2013    Procedure: LEFT HEART CATHETERIZATION WITH CORONARY ANGIOGRAM;  Surgeon: Jettie Booze, MD;  Location: [facility] CATH LAB;  Service: Cardiovascular;  Laterality: N/A;  . Tonsillectomy    . Cardiac catheterization    . Knee arthroscopy with medial menisectomy Right 12/16/2014    Procedure: RIGHT KNEE ARTHROSCOPY WITH MEDIAL MENISECTOMY microfracture medial femoral condyle abrasion condroplasty medial femoral condyle lateral menisectomy;  Surgeon: Tobi Bastos, MD;  [facility];  Service: Orthopedics;  Laterality: Right;    Current  Medications: Outpatient Prescriptions Prior to Visit  Medication Sig Dispense Refill  . aspirin 81 MG tablet Take 81 mg by mouth daily.      Marland Kitchen atorvastatin (LIPITOR) 80 MG tablet Take 1 tablet (80 mg total) by mouth daily. 90 tablet 3  . baclofen (LIORESAL) 10 MG tablet Take 0.5 tablets (5 mg total) by mouth 3 (three) times daily. 30 each 3  . beta carotene w/minerals (OCUVITE) tablet Take 1 tablet by mouth daily.    . cetirizine (ZYRTEC) 10 MG tablet Take 10 mg by mouth daily.    . citalopram (CELEXA) 20 MG tablet Take 1 tablet (20 mg total) by mouth daily. 90 tablet 3  . clonazePAM (KLONOPIN) 1 MG tablet Take 1 tablet (1 mg total) by mouth 2 (two) times daily as needed for anxiety. 60 tablet 5  . clopidogrel (PLAVIX) 75 MG tablet TAKE ONE TABLET BY MOUTH ONCE DAILY WITH BREAKFAST 30 tablet 6  . clotrimazole-betamethasone (LOTRISONE) cream APPLY  CREAM TOPICALLY TO AFFECTED AREA TWICE DAILY 45 g 6  . cromolyn (OPTICROM) 4 % ophthalmic solution Place 1 drop into both eyes 4 (four) times daily.    Marland Kitchen diltiazem (CARDIZEM CD) 120 MG 24 hr capsule TAKE ONE CAPSULE BY MOUTH ONCE DAILY 30 capsule 5  . ezetimibe (ZETIA) 10 MG tablet Take 1 tablet (10 mg total) by mouth daily. 30 tablet 11  . fluticasone (CUTIVATE) 0.05 % cream Apply to lower leg rash bid prn 30 g 1  . gabapentin (NEURONTIN) 300 MG capsule TAKE ONE CAPSULE BY MOUTH THREE TIMES DAILY 270 capsule 3  . glucose blood (ONE TOUCH ULTRA TEST) test strip TEST 3 TIMES A DAY AS DIRECTED 100 each 11  . HUMALOG KWIKPEN 200 UNIT/ML SOPN INJECT 24 UNITS SUBCUTANEOUSLY THREE TIMES DAILY BEFORE MEALS 15 mL 6  . HUMULIN N KWIKPEN 100 UNIT/ML Kiwkpen INJECT 120 UNITS SUBCUTANEOUSLY WITH BREAKFAST AND 50 UNITS SUBCUTANEOUSLY WITH SUPPER 60 mL 6  . hydrOXYzine (ATARAX/VISTARIL) 25 MG tablet 1 tab at supper and 1 at bedtime (for insomnia) 60 tablet 11  . isosorbide mononitrate (IMDUR) 30 MG 24 hr tablet TAKE ONE TABLET BY MOUTH ONCE DAILY 90 tablet 3  .  lisinopril (PRINIVIL,ZESTRIL) 40 MG tablet TAKE ONE TABLET BY MOUTH ONCE DAILY 90 tablet 3  . metFORMIN (GLUCOPHAGE) 1000 MG tablet TAKE ONE TABLET BY MOUTH TWICE DAILY WITH MEALS 60 tablet 6  . Omega-3 Fatty Acids ([employer] FISH OIL) 1000 MG CAPS Take 2 tablets by mouth 2 (two) times daily. 90 capsule   . ONETOUCH DELICA LANCETS 99991111 MISC 1 each by Other route 3 (three) times daily. USE TO TEST 3 TIMES A DAY 100 each 11  . torsemide (DEMADEX) 20 MG tablet TAKE ONE TABLET BY MOUTH ONCE DAILY 90 tablet  1   No facility-administered medications prior to visit.     Allergies:   Review of patient's allergies indicates no known allergies.   Social History   Social History  . Marital Status: Married    Spouse Name: N/A  . Number of Children: N/A  . Years of Education: N/A   Occupational History  . Retired    Social History Main Topics  . Smoking status: Former Smoker -- 1.50 packs/day for 30 years    Types: Cigarettes, Pipe, Cigars    Quit date: 10/30/1983  . Smokeless tobacco: Current User    Types: Chew  . Alcohol Use: No  . Drug Use: No  . Sexual Activity: Not Asked   Other Topics Concern  . None   Social History Narrative   Married, one son and one daughter.   His daughter and her two children live with him.   Coffee daily.  Former smoker.  No alcohol.   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        Family History:  The patient's family history includes Alcohol abuse in his father; Aneurysm in his mother. There is no history of Colon cancer.   ROS:   Please see the history of present illness.    ROS All other systems reviewed and are negative.   PHYSICAL EXAM:   VS:  BP 100/60 mmHg  Pulse 96  Ht 5\' 9"  (1.753 m)  Wt 314 lb (142.429 kg)  BMI 46.35 kg/m2  SpO2 98%   GEN: Well nourished, well developed, in no acute distress HEENT: normal Neck: no JVD, carotid bruits, or masses Cardiac: RRR; no murmurs, rubs, or gallops,no edema.  Intact  distal pulses bilaterally.  Respiratory:  clear to auscultation bilaterally, normal work of breathing GI: soft, nontender, nondistended, + BS MS: no deformity or atrophy Skin: warm and dry, no rash Neuro:  Alert and Oriented x 3, Strength and sensation are intact Psych: euthymic mood, full affect  Wt Readings from Last 3 Encounters:  03/12/16 314 lb (142.429 kg)  03/08/16 318 lb 8 oz (144.471 kg)  01/26/16 327 lb 8 oz (148.553 kg)      Studies/Labs Reviewed:   EKG:  EKG is not ordered today.   Recent Labs: 01/26/2016: Magnesium 1.6 02/09/2016: ALT CANCELED 03/08/2016: BUN 24*; Creatinine, Ser 1.08; Potassium 5.1; Sodium 138   Lipid Panel    Component Value Date/Time   CHOL CANCELED 02/09/2016 0844   TRIG CANCELED 02/09/2016 0844   TRIG 268* 09/06/2006 1555   HDL CANCELED 02/09/2016 0844   CHOLHDL CANCELED 02/09/2016 0844   CHOLHDL 4.8 CALC 09/06/2006 1555   VLDL NOT CALC 02/09/2016 0844   LDLCALC NOT CALC 02/09/2016 0844   LDLDIRECT 74.0 04/15/2015 0838   LDLDIRECT 96.1 09/06/2006 1555    Additional studies/ records that were reviewed today include:  None    ASSESSMENT:    1. Chronic diastolic CHF (congestive heart failure) ([place])   2. Atherosclerosis of native coronary artery of native heart without angina pectoris   3. Essential hypertension   4. Dyslipidemia      PLAN:  In order of problems listed above:  1. Chronic diastolic CHF - appears euvolemic on exam. Continue Cardizem/Demadex 2. ASCAD with chronically occluded LAD and diagonal with right to left collaterals, mild disease in LCX and moderate disease of mid RCA on medical mangement with no angina.  Continue ASA/Plaivx/statin/long acting nitrate. 3. HTN - BP controlled.  Continue ACE I  and CCB.   4. Dyslipidemia with severe hypertriglyceridemia.  Recent lipids showed TAGs of 584 and HDL 31.  Patient had gotten confused about adding zetia and thought Zetia replaced Lipitor and stopped the Lipitor.  He is  back on it now so will recheck FLP and ALT.     Medication Adjustments/Labs and Tests Ordered: Current medicines are reviewed at length with the patient today.  Concerns regarding medicines are outlined above.  Medication changes, Labs and Tests ordered today are listed in the Patient Instructions below.  There are no Patient Instructions on file for this visit.   Signed, Fransico Him, MD  03/12/2016 8:46 AM    [facility] [address], [place], [place]  [address] Phone: [phone]; Fax: [phone]

## 2016-03-12 ENCOUNTER — Encounter: Payer: Self-pay | Admitting: Cardiology

## 2016-03-12 ENCOUNTER — Ambulatory Visit (INDEPENDENT_AMBULATORY_CARE_PROVIDER_SITE_OTHER): Payer: Medicare Other | Admitting: Cardiology

## 2016-03-12 VITALS — BP 100/60 | HR 96 | Ht 69.0 in | Wt 314.0 lb

## 2016-03-12 DIAGNOSIS — I10 Essential (primary) hypertension: Secondary | ICD-10-CM | POA: Diagnosis not present

## 2016-03-12 DIAGNOSIS — E785 Hyperlipidemia, unspecified: Secondary | ICD-10-CM

## 2016-03-12 DIAGNOSIS — I2510 Atherosclerotic heart disease of native coronary artery without angina pectoris: Secondary | ICD-10-CM | POA: Diagnosis not present

## 2016-03-12 DIAGNOSIS — I5032 Chronic diastolic (congestive) heart failure: Secondary | ICD-10-CM | POA: Diagnosis not present

## 2016-03-12 LAB — HEPATIC FUNCTION PANEL
ALBUMIN: 3.9 g/dL (ref 3.6–5.1)
ALT: 17 U/L (ref 9–46)
AST: 17 U/L (ref 10–35)
Alkaline Phosphatase: 46 U/L (ref 40–115)
BILIRUBIN TOTAL: 0.5 mg/dL (ref 0.2–1.2)
Bilirubin, Direct: 0.1 mg/dL (ref <=0.2)
Indirect Bilirubin: 0.4 mg/dL (ref 0.2–1.2)
TOTAL PROTEIN: 6.7 g/dL (ref 6.1–8.1)

## 2016-03-12 LAB — LIPID PANEL
CHOL/HDL RATIO: 3.6 ratio (ref <=5.0)
CHOLESTEROL: 108 mg/dL — AB (ref 125–200)
HDL: 30 mg/dL — AB (ref >=40)
LDL Cholesterol: 41 mg/dL (ref <130)
Triglycerides: 184 mg/dL — ABNORMAL HIGH (ref <150)
VLDL: 37 mg/dL — ABNORMAL HIGH (ref <30)

## 2016-03-12 LAB — BASIC METABOLIC PANEL
BUN: 29 mg/dL — AB (ref 7–25)
CALCIUM: 9.2 mg/dL (ref 8.6–10.3)
CO2: 26 mmol/L (ref 20–31)
CREATININE: 1.14 mg/dL (ref 0.70–1.18)
Chloride: 100 mmol/L (ref 98–110)
GLUCOSE: 106 mg/dL — AB (ref 65–99)
Potassium: 5.1 mmol/L (ref 3.5–5.3)
Sodium: 137 mmol/L (ref 135–146)

## 2016-03-12 NOTE — Addendum Note (Signed)
Addended by: Harland German A on: 03/12/2016 09:50 AM   Modules accepted: Orders

## 2016-03-12 NOTE — Patient Instructions (Addendum)
Medication Instructions:  Your physician recommends that you continue on your current medications as directed. Please refer to the Current Medication list given to you today.   Labwork: Your physician recommends that you have FASTING lab work (LFTs, Lipids, BMET).  Testing/Procedures: None  Follow-Up: Your physician wants you to follow-up in: 6 months with Dr. Radford Pax. You will receive a reminder letter in the mail two months in advance. If you don't receive a letter, please call our office to schedule the follow-up appointment.   Any Other Special Instructions Will Be Listed Below (If Applicable).     If you need a refill on your cardiac medications before your next appointment, please call your pharmacy.

## 2016-03-15 ENCOUNTER — Telehealth: Payer: Self-pay | Admitting: Cardiology

## 2016-03-15 ENCOUNTER — Telehealth: Payer: Self-pay | Admitting: Family Medicine

## 2016-03-15 ENCOUNTER — Emergency Department (HOSPITAL_COMMUNITY): Payer: Medicare Other

## 2016-03-15 ENCOUNTER — Encounter (HOSPITAL_COMMUNITY): Payer: Self-pay

## 2016-03-15 ENCOUNTER — Observation Stay (HOSPITAL_COMMUNITY)
Admission: EM | Admit: 2016-03-15 | Discharge: 2016-03-16 | Disposition: A | Discharge status: Home / Self Care | Payer: Medicare Other | Attending: Cardiology | Admitting: Cardiology

## 2016-03-15 DIAGNOSIS — R0602 Shortness of breath: Secondary | ICD-10-CM | POA: Diagnosis not present

## 2016-03-15 DIAGNOSIS — I25118 Atherosclerotic heart disease of native coronary artery with other forms of angina pectoris: Secondary | ICD-10-CM | POA: Diagnosis not present

## 2016-03-15 DIAGNOSIS — R0789 Other chest pain: Secondary | ICD-10-CM | POA: Diagnosis not present

## 2016-03-15 DIAGNOSIS — Z6841 Body Mass Index (BMI) 40.0 and over, adult: Secondary | ICD-10-CM | POA: Diagnosis not present

## 2016-03-15 DIAGNOSIS — E662 Morbid (severe) obesity with alveolar hypoventilation: Secondary | ICD-10-CM | POA: Insufficient documentation

## 2016-03-15 DIAGNOSIS — Z7902 Long term (current) use of antithrombotics/antiplatelets: Secondary | ICD-10-CM | POA: Diagnosis not present

## 2016-03-15 DIAGNOSIS — J449 Chronic obstructive pulmonary disease, unspecified: Secondary | ICD-10-CM | POA: Insufficient documentation

## 2016-03-15 DIAGNOSIS — Z87891 Personal history of nicotine dependence: Secondary | ICD-10-CM | POA: Diagnosis not present

## 2016-03-15 DIAGNOSIS — E1122 Type 2 diabetes mellitus with diabetic chronic kidney disease: Secondary | ICD-10-CM | POA: Insufficient documentation

## 2016-03-15 DIAGNOSIS — E1169 Type 2 diabetes mellitus with other specified complication: Secondary | ICD-10-CM | POA: Insufficient documentation

## 2016-03-15 DIAGNOSIS — I130 Hypertensive heart and chronic kidney disease with heart failure and stage 1 through stage 4 chronic kidney disease, or unspecified chronic kidney disease: Secondary | ICD-10-CM | POA: Diagnosis not present

## 2016-03-15 DIAGNOSIS — I252 Old myocardial infarction: Secondary | ICD-10-CM | POA: Insufficient documentation

## 2016-03-15 DIAGNOSIS — R079 Chest pain, unspecified: Secondary | ICD-10-CM | POA: Diagnosis not present

## 2016-03-15 DIAGNOSIS — Z9981 Dependence on supplemental oxygen: Secondary | ICD-10-CM | POA: Diagnosis not present

## 2016-03-15 DIAGNOSIS — Z79899 Other long term (current) drug therapy: Secondary | ICD-10-CM | POA: Diagnosis not present

## 2016-03-15 DIAGNOSIS — R0600 Dyspnea, unspecified: Secondary | ICD-10-CM | POA: Diagnosis not present

## 2016-03-15 DIAGNOSIS — Z7982 Long term (current) use of aspirin: Secondary | ICD-10-CM | POA: Insufficient documentation

## 2016-03-15 DIAGNOSIS — I2510 Atherosclerotic heart disease of native coronary artery without angina pectoris: Secondary | ICD-10-CM | POA: Diagnosis not present

## 2016-03-15 DIAGNOSIS — I5032 Chronic diastolic (congestive) heart failure: Secondary | ICD-10-CM | POA: Insufficient documentation

## 2016-03-15 DIAGNOSIS — I10 Essential (primary) hypertension: Secondary | ICD-10-CM | POA: Diagnosis not present

## 2016-03-15 DIAGNOSIS — E785 Hyperlipidemia, unspecified: Secondary | ICD-10-CM | POA: Insufficient documentation

## 2016-03-15 DIAGNOSIS — N183 Chronic kidney disease, stage 3 (moderate): Secondary | ICD-10-CM | POA: Diagnosis not present

## 2016-03-15 DIAGNOSIS — E1151 Type 2 diabetes mellitus with diabetic peripheral angiopathy without gangrene: Secondary | ICD-10-CM | POA: Diagnosis not present

## 2016-03-15 DIAGNOSIS — E119 Type 2 diabetes mellitus without complications: Secondary | ICD-10-CM | POA: Insufficient documentation

## 2016-03-15 DIAGNOSIS — Z794 Long term (current) use of insulin: Secondary | ICD-10-CM | POA: Insufficient documentation

## 2016-03-15 LAB — CBC
HEMATOCRIT: 42.1 % (ref 39.0–52.0)
HEMOGLOBIN: 13.3 g/dL (ref 13.0–17.0)
MCH: 28.1 pg (ref 26.0–34.0)
MCHC: 31.6 g/dL (ref 30.0–36.0)
MCV: 89 fL (ref 78.0–100.0)
Platelets: 245 10*3/uL (ref 150–400)
RBC: 4.73 MIL/uL (ref 4.22–5.81)
RDW: 15 % (ref 11.5–15.5)
WBC: 9 10*3/uL (ref 4.0–10.5)

## 2016-03-15 LAB — I-STAT TROPONIN, ED: Troponin i, poc: 0.01 ng/mL (ref 0.00–0.08)

## 2016-03-15 LAB — BASIC METABOLIC PANEL
ANION GAP: 12 (ref 5–15)
BUN: 18 mg/dL (ref 6–20)
CHLORIDE: 97 mmol/L — AB (ref 101–111)
CO2: 28 mmol/L (ref 22–32)
Calcium: 9.6 mg/dL (ref 8.9–10.3)
Creatinine, Ser: 0.96 mg/dL (ref 0.61–1.24)
GFR calc Af Amer: >60 mL/min (ref >60)
GFR calc non Af Amer: >60 mL/min (ref >60)
GLUCOSE: 120 mg/dL — AB (ref 65–99)
POTASSIUM: 4.6 mmol/L (ref 3.5–5.1)
Sodium: 137 mmol/L (ref 135–145)

## 2016-03-15 LAB — BRAIN NATRIURETIC PEPTIDE: B Natriuretic Peptide: 7.4 pg/mL (ref 0.0–100.0)

## 2016-03-15 LAB — GLUCOSE, CAPILLARY
GLUCOSE-CAPILLARY: 100 mg/dL — AB (ref 65–99)
Glucose-Capillary: 104 mg/dL — ABNORMAL HIGH (ref 65–99)

## 2016-03-15 LAB — TROPONIN I

## 2016-03-15 LAB — CBG MONITORING, ED: GLUCOSE-CAPILLARY: 114 mg/dL — AB (ref 65–99)

## 2016-03-15 MED ORDER — EZETIMIBE 10 MG PO TABS
10.0000 mg | ORAL_TABLET | Freq: Every day | ORAL | Status: DC
  Administered 2016-03-16: 10 mg via ORAL
  Filled 2016-03-15: qty 1

## 2016-03-15 MED ORDER — HEPARIN SODIUM (PORCINE) 5000 UNIT/ML IJ SOLN
5000.0000 [IU] | Freq: Three times a day (TID) | INTRAMUSCULAR | Status: DC
  Administered 2016-03-15 – 2016-03-16 (×2): 5000 [IU] via SUBCUTANEOUS
  Filled 2016-03-15 (×2): qty 1

## 2016-03-15 MED ORDER — CITALOPRAM HYDROBROMIDE 20 MG PO TABS
20.0000 mg | ORAL_TABLET | Freq: Every day | ORAL | Status: DC
  Administered 2016-03-16: 20 mg via ORAL
  Filled 2016-03-15: qty 1

## 2016-03-15 MED ORDER — CLONAZEPAM 0.5 MG PO TABS
1.0000 mg | ORAL_TABLET | Freq: Two times a day (BID) | ORAL | Status: DC | PRN
  Administered 2016-03-15: 1 mg via ORAL
  Filled 2016-03-15: qty 2

## 2016-03-15 MED ORDER — GABAPENTIN 300 MG PO CAPS
300.0000 mg | ORAL_CAPSULE | Freq: Three times a day (TID) | ORAL | Status: DC
  Administered 2016-03-15 – 2016-03-16 (×3): 300 mg via ORAL
  Filled 2016-03-15 (×3): qty 1

## 2016-03-15 MED ORDER — CLOPIDOGREL BISULFATE 75 MG PO TABS
75.0000 mg | ORAL_TABLET | Freq: Every day | ORAL | Status: DC
  Administered 2016-03-16: 75 mg via ORAL
  Filled 2016-03-15: qty 1

## 2016-03-15 MED ORDER — SODIUM CHLORIDE 0.9% FLUSH
3.0000 mL | Freq: Two times a day (BID) | INTRAVENOUS | Status: DC
  Administered 2016-03-15 – 2016-03-16 (×2): 3 mL via INTRAVENOUS

## 2016-03-15 MED ORDER — ISOSORBIDE MONONITRATE ER 60 MG PO TB24
60.0000 mg | ORAL_TABLET | Freq: Every day | ORAL | Status: DC
  Administered 2016-03-15: 60 mg via ORAL
  Filled 2016-03-15: qty 1

## 2016-03-15 MED ORDER — PANTOPRAZOLE SODIUM 40 MG PO TBEC
40.0000 mg | DELAYED_RELEASE_TABLET | Freq: Every day | ORAL | Status: DC
  Administered 2016-03-15 – 2016-03-16 (×2): 40 mg via ORAL
  Filled 2016-03-15 (×2): qty 1

## 2016-03-15 MED ORDER — BACLOFEN 5 MG HALF TABLET
5.0000 mg | ORAL_TABLET | Freq: Three times a day (TID) | ORAL | Status: DC | PRN
  Filled 2016-03-15: qty 1

## 2016-03-15 MED ORDER — LISINOPRIL 40 MG PO TABS
40.0000 mg | ORAL_TABLET | Freq: Every day | ORAL | Status: DC
  Administered 2016-03-16: 40 mg via ORAL
  Filled 2016-03-15: qty 1

## 2016-03-15 MED ORDER — ASPIRIN 81 MG PO CHEW
81.0000 mg | CHEWABLE_TABLET | Freq: Every day | ORAL | Status: DC
  Administered 2016-03-16: 81 mg via ORAL
  Filled 2016-03-15: qty 1

## 2016-03-15 MED ORDER — INSULIN ASPART 100 UNIT/ML ~~LOC~~ SOLN: 0.0000 [IU] | Freq: Three times a day (TID) | SUBCUTANEOUS | Status: DC

## 2016-03-15 MED ORDER — ATORVASTATIN CALCIUM 80 MG PO TABS
80.0000 mg | ORAL_TABLET | Freq: Every day | ORAL | Status: DC
  Administered 2016-03-15: 80 mg via ORAL
  Filled 2016-03-15: qty 1

## 2016-03-15 MED ORDER — INSULIN NPH (HUMAN) (ISOPHANE) 100 UNIT/ML ~~LOC~~ SUSP
30.0000 [IU] | Freq: Every day | SUBCUTANEOUS | Status: DC
  Administered 2016-03-15: 30 [IU] via SUBCUTANEOUS
  Filled 2016-03-15: qty 10

## 2016-03-15 MED ORDER — CROMOLYN SODIUM 4 % OP SOLN
1.0000 [drp] | Freq: Four times a day (QID) | OPHTHALMIC | Status: DC
Start: 2016-03-15 — End: 2016-03-16
  Administered 2016-03-15 – 2016-03-16 (×2): 1 [drp] via OPHTHALMIC
  Filled 2016-03-15: qty 10

## 2016-03-15 MED ORDER — DILTIAZEM HCL ER COATED BEADS 120 MG PO CP24
120.0000 mg | ORAL_CAPSULE | Freq: Every day | ORAL | Status: DC
  Administered 2016-03-16: 120 mg via ORAL
  Filled 2016-03-15: qty 1

## 2016-03-15 MED ORDER — TORSEMIDE 20 MG PO TABS
20.0000 mg | ORAL_TABLET | Freq: Every day | ORAL | Status: DC
  Administered 2016-03-16: 20 mg via ORAL
  Filled 2016-03-15: qty 1

## 2016-03-15 MED ORDER — GI COCKTAIL ~~LOC~~: 30.0000 mL | Freq: Once | ORAL | Status: DC

## 2016-03-15 MED ORDER — INSULIN ASPART 100 UNIT/ML ~~LOC~~ SOLN
17.0000 [IU] | Freq: Three times a day (TID) | SUBCUTANEOUS | Status: DC
  Administered 2016-03-16: 17 [IU] via SUBCUTANEOUS

## 2016-03-15 NOTE — ED Provider Notes (Signed)
CSN: [id number]     Arrival date & time 03/15/16  [id number] History   First MD Initiated Contact with Patient 03/15/16 [phone]     Chief Complaint  Patient presents with  . Shortness of Breath  . Chest Pain     (Consider location/radiation/quality/duration/timing/severity/associated sxs/prior Treatment) HPI   70 year old morbidly obese male with history of hypoventilation syndrome on home O2, chronic low extremity edema, hypertension, chronic diastolic CHF control on diuretic, presenting today for complaint of chest pain and short of breath. Patient report he woke up this morning with left-sided chest pressure and having shortness of breath. Episode lasting approximately 30 minutes and resolved. He took his usual morning medications including baby aspirin after the pain is resolved. He did call his cardiology office and was recommended to come to the ER for further evaluation. At this time he is without any specific symptom. Patient denies having URI symptoms, fever, chills, headache, lightheadedness, dizziness, diaphoresis, productive cough, exertional chest discomfort, abdominal pain, back pain, focal numbness or weakness. His Cardiac history is significant for ASCAD with severely diseased and chronically occluded LAD and diagonal with right left collaterals, mild disease in the left circumflex and moderate disease in the mid RCA on medical management with Imdur and Plavix. His last stress test was in 2014. His cardiologist is Dr. Golden Hurter.     Past Medical History  Diagnosis Date  . DIABETES MELLITUS, TYPE II 06/24/2007    No diab retpthy as of 08/05/15 eye exam.  . HYPERLIPIDEMIA 06/24/2007  . HYPERTENSION 05/01/2007  . Hypoxemia 01/27/2010  . OBESITY 09/02/2008  . OSTEOARTHRITIS 05/01/2007  . Chronic diastolic CHF (congestive heart failure) ([place]) 02/25/2009  . Asthma     as a child  . Cataract     multiple types, bilateral  . History of kidney stones   . Pruritic condition 05/2014    Allergist  summer 2015, no new testing.  . Dermatitis 05/2014  . Coronary artery disease     chronically occluded LAD and diagonal with right to left collaterals, mild disease in the left circ and moderate disease in the mid RCA on medical management with Imdur, ASA, and Plavix.  . Myocardial infarction [facility])     pt states he was informed per MD that he has had one but pt was unaware   . COPD (chronic obstructive pulmonary disease) ([place])   . Obesity hypoventilation syndrome (HCC)     oxygen 24/7 (2 liters [place] as of 02/2016)  . 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 OSA (obstructive sleep apnea)     not tested; pt scored 4 per stop bang tool results sent to PCP   . Essential hypertension 05/01/2007    Qualifier: Diagnosis of  By: Tiney Rouge CMA, Ellison Hughs     . Presbycusis of both ears 05/2015    [facility]  . Chronic renal insufficiency, stage III (moderate) 2017    Stage II/III (GFR around 60)  . Hyperkalemia 12/2015    Decreased ACE-I by 50% in response, then potassium normalized.   Past Surgical History  Procedure Laterality Date  . Appendectomy    . Wrist surgery      right fx  . Shoulder surgery      right fx  . Back surgery  2001  . Carpal tunnel release Left   . Cataract extraction w/phaco Right 04/29/2013    Procedure: CATARACT EXTRACTION PHACO AND INTRAOCULAR  LENS PLACEMENT (IOC);  Surgeon: Adonis Brook, MD;  Location: [facility];  Service: Ophthalmology;  Laterality: Right;  . Colonoscopy w/ polypectomy  08/2011    Many polyps--all hyperplastic, severe diverticulosis, int hem.  BioIQ hemoccult testing via [facility] 06/22/15 NEG  . Left heart catheterization with coronary angiogram N/A 10/26/2013    Procedure: LEFT HEART CATHETERIZATION WITH CORONARY ANGIOGRAM;  Surgeon: Jettie Booze, MD;  Location: [facility] CATH LAB;  Service: Cardiovascular;  Laterality: N/A;  . Tonsillectomy    . Cardiac catheterization    . Knee arthroscopy with medial  menisectomy Right 12/16/2014    Procedure: RIGHT KNEE ARTHROSCOPY WITH MEDIAL MENISECTOMY microfracture medial femoral condyle abrasion condroplasty medial femoral condyle lateral menisectomy;  Surgeon: Tobi Bastos, MD;  [facility];  Service: Orthopedics;  Laterality: Right;   Family History  Problem Relation Age of Onset  . Aneurysm Mother   . Alcohol abuse Father   . Colon cancer Neg Hx    Social History  Substance Use Topics  . Smoking status: Former Smoker -- 1.50 packs/day for 30 years    Types: Cigarettes, Pipe, Cigars    Quit date: 10/30/1983  . Smokeless tobacco: Current User    Types: Chew  . Alcohol Use: No    Review of Systems  All other systems reviewed and are negative.     Allergies  Review of patient's allergies indicates no known allergies.  Home Medications   Prior to Admission medications   Medication Sig Start Date End Date Taking? Authorizing Provider  aspirin 81 MG tablet Take 81 mg by mouth daily.      Historical Provider, MD  atorvastatin (LIPITOR) 80 MG tablet Take 1 tablet (80 mg total) by mouth daily. 01/31/16   Sueanne Margarita, MD  baclofen (LIORESAL) 10 MG tablet Take 0.5 tablets (5 mg total) by mouth 3 (three) times daily. 09/15/15   Tammi Sou, MD  beta carotene w/minerals (OCUVITE) tablet Take 1 tablet by mouth daily.    Historical Provider, MD  cetirizine (ZYRTEC) 10 MG tablet Take 10 mg by mouth daily.    Historical Provider, MD  citalopram (CELEXA) 20 MG tablet Take 1 tablet (20 mg total) by mouth daily. 01/04/16   Tammi Sou, MD  clonazePAM (KLONOPIN) 1 MG tablet Take 1 tablet (1 mg total) by mouth 2 (two) times daily as needed for anxiety. 01/04/16   Tammi Sou, MD  clopidogrel (PLAVIX) 75 MG tablet TAKE ONE TABLET BY MOUTH ONCE DAILY WITH BREAKFAST 09/12/15   Tammi Sou, MD  clotrimazole-betamethasone (LOTRISONE) cream APPLY  CREAM TOPICALLY TO AFFECTED AREA TWICE DAILY 04/01/15   Tammi Sou, MD  cromolyn  (OPTICROM) 4 % ophthalmic solution Place 1 drop into both eyes 4 (four) times daily. 02/09/16   Historical Provider, MD  diltiazem (CARDIZEM CD) 120 MG 24 hr capsule TAKE ONE CAPSULE BY MOUTH ONCE DAILY 05/03/14   Lisabeth Pick, MD  ezetimibe (ZETIA) 10 MG tablet Take 1 tablet (10 mg total) by mouth daily. 10/17/15   Sueanne Margarita, MD  fluticasone (CUTIVATE) 0.05 % cream Apply to lower leg rash bid prn 11/09/15   Tammi Sou, MD  gabapentin (NEURONTIN) 300 MG capsule TAKE ONE CAPSULE BY MOUTH THREE TIMES DAILY 08/29/15   Tammi Sou, MD  glucose blood (ONE TOUCH ULTRA TEST) test strip TEST 3 TIMES A DAY AS DIRECTED 02/23/16   Tammi Sou, MD  HUMALOG KWIKPEN 200 UNIT/ML SOPN INJECT 24  UNITS SUBCUTANEOUSLY THREE TIMES DAILY BEFORE MEALS 12/05/15   Tammi Sou, MD  HUMULIN N KWIKPEN 100 UNIT/ML [place] INJECT 120 UNITS SUBCUTANEOUSLY WITH BREAKFAST AND 50 UNITS SUBCUTANEOUSLY WITH SUPPER 12/13/15   Tammi Sou, MD  hydrOXYzine (ATARAX/VISTARIL) 25 MG tablet 1 tab at supper and 1 at bedtime (for insomnia) 11/09/15   Tammi Sou, MD  isosorbide mononitrate (IMDUR) 30 MG 24 hr tablet TAKE ONE TABLET BY MOUTH ONCE DAILY 08/29/15   Tammi Sou, MD  lisinopril (PRINIVIL,ZESTRIL) 40 MG tablet TAKE ONE TABLET BY MOUTH ONCE DAILY 10/28/15   Tammi Sou, MD  metFORMIN (GLUCOPHAGE) 1000 MG tablet TAKE ONE TABLET BY MOUTH TWICE DAILY WITH MEALS 01/17/16   Tammi Sou, MD  Omega-3 Fatty Acids ([employer] FISH OIL) 1000 MG CAPS Take 2 tablets by mouth 2 (two) times daily. 10/18/14   Sueanne Margarita, MD  ONETOUCH DELICA LANCETS [id number] MISC 1 each by Other route 3 (three) times daily. USE TO TEST 3 TIMES A DAY 02/23/16   Tammi Sou, MD  torsemide (DEMADEX) 20 MG tablet TAKE ONE TABLET BY MOUTH ONCE DAILY 09/26/15   Tammi Sou, MD   BP 165/135 mmHg  Pulse 93  Temp(Src) 98.2 F (36.8 C) (Oral)  Resp 14  SpO2 93% Physical Exam  Constitutional: He is oriented to person, place,  and time. He appears well-developed and well-nourished. No distress.  Obese Caucasian male in no acute discomfort and nontoxic in appearance  HENT:  Head: Atraumatic.  Mouth/Throat: Oropharynx is clear and moist.  Eyes: Conjunctivae are normal.  Neck: Normal range of motion. Neck supple. No JVD present.  Cardiovascular: Normal rate and regular rhythm.   Pulmonary/Chest: Effort normal. No respiratory distress. He has no wheezes. He has rales (Faint crackles at base of lungs. No wheezes, rhonchi.).  Abdominal: Soft. Bowel sounds are normal. He exhibits no distension. There is no tenderness.  Musculoskeletal: He exhibits edema (Trace pitting edema bilaterally to lower extremity ).  Neurological: He is alert and oriented to person, place, and time.  Skin: No rash noted.  Psychiatric: He has a normal mood and affect.  Nursing note and vitals reviewed.   ED Course  Procedures (including critical care time) Labs Review Labs Reviewed  BASIC METABOLIC PANEL - Abnormal; Notable for the following:    Chloride 97 (*)    Glucose, Bld 120 (*)    All other components within normal limits  CBG MONITORING, ED - Abnormal; Notable for the following:    Glucose-Capillary 114 (*)    All other components within normal limits  CBC  BRAIN NATRIURETIC PEPTIDE  I-STAT TROPOININ, ED    Imaging Review Dg Chest 2 View  03/15/2016  CLINICAL DATA:  70 year old male with shortness of breath, chest tightness onset today. Initial encounter. Former smoker. EXAM: CHEST  2 VIEW COMPARISON:  12/08/2014 and earlier. FINDINGS: Stable lung volumes. Mediastinal contours are stable and within normal limits. Visualized tracheal air column is within normal limits. No pneumothorax, pulmonary edema, pleural effusion or confluent pulmonary opacity. Stable visualized osseous structures. Chronic left clavicle midshaft deformity. Widespread degenerative osteophytosis in the spine. IMPRESSION: No acute cardiopulmonary abnormality.  Electronically Signed   By: Genevie Ann M.D.   On: 03/15/2016 10:21   I have personally reviewed and evaluated these images and lab results as part of my medical decision-making.   EKG Interpretation   Date/Time:  Thursday Mar 15 2016 09:34:38 EDT Ventricular Rate:  95 PR Interval:  145 QRS Duration: 152 QT Interval:  377 QTC Calculation: 474 R Axis:   35 Text Interpretation:  Sinus rhythm Right bundle branch block No  significant change since last tracing Confirmed by ALLEN  MD, ANTHONY  ([id number]) on 03/15/2016 9:48:33 AM      MDM   Final diagnoses:  Chest pain, unspecified chest pain type    BP 109/59 mmHg  Pulse 79  Temp(Src) 98.2 F (36.8 C) (Oral)  Resp 18  SpO2 95%   10:04 AM Patient with history of cardiac disease here with resolving chest pain or shortness of breath that started earlier this morning. He has been symptom-free for approximately 3 hours. Workup initiated. Care discussed with Dr. Zenia Resides   11:25 AM HEART score of 5, which puts pt in the moderate risk for MACE. Currently CP free.  I have consulted with our cardiology master, Wannetta Sender who will request for our cardiologist to evaluate pt in the ER and will determine further management.    2:26 PM Cardiology resident has seen and evaluate pt in the ER and will admit to obs, telemetry for cardiac rule out.    Domenic Moras, PA-C 03/15/16 1426

## 2016-03-15 NOTE — Telephone Encounter (Signed)
FYI

## 2016-03-15 NOTE — H&P (Signed)
Primary Cardiologist: Dr. Radford Pax PMD: Dr. Anitra Lauth Chief Complaint: Chest Pain  HPI: [name] is a 70 yo male with PMHx of CAD (severely diseased and chronically occluded LAD and diagonal with right to left collaterals, mild disease in the left circ and moderate disease in the mid RCA on medical management with Imdur and Plavix), Chronic HFpEF, Obesity Hypoventilation Syndrome, Morbid Obesity on Home O2, Chronic LE Edema, HTN, HLD, T2DM, and Morbid Obesity who presents to the ED with complaint of chest pain.  Patient states he woke up this morning at 5:30 am with left sided non-radiating chest tightness and increased shortness of breath. Tightness lasted for 30 minutes and resolved after he took his morning medications and urinated. He admits to orthopnea. Nothing seemed to make it better or worse. It was not worse with deep inspiration, movement or exertion. It was not painful. It was not associated with diaphoresis, lightheadedness, nausea, vomiting, or abdominal pain. He admits to dysphagia and odynophagia over the past 2-3 days which is new for him as if something is getting stuck in his throat. He has never had an endoscopy before. Patient states he has done very well since his cath in 2014 and denies any history angina or DOE. He does not get chest pain. He is a previous smoker. No alcohol or illicit drug use.  In the ED, vitals signs were T98.2 , BP initially 162/135 now 111/60, HR 93, RR 14, satting 93% on his home 2L O2. CBC and BMET within normal limits. eKG showed RBBB, sinus rhythm, no ST/TW changes, similar to prior EKG. Troponin 0.01. CXR without acute abnormalities. BNP 7.4.    Past Medical History  Diagnosis Date  . DIABETES MELLITUS, TYPE II 06/24/2007    No diab retpthy as of 08/05/15 eye exam.  . HYPERLIPIDEMIA 06/24/2007  . HYPERTENSION 05/01/2007  . Hypoxemia 01/27/2010  . OBESITY 09/02/2008  . OSTEOARTHRITIS 05/01/2007  . Chronic diastolic CHF (congestive heart failure) ([place])  02/25/2009  . Asthma     as a child  . Cataract     multiple types, bilateral  . History of kidney stones   . Pruritic condition 05/2014    Allergist summer 2015, no new testing.  . Dermatitis 05/2014  . Coronary artery disease     chronically occluded LAD and diagonal with right to left collaterals, mild disease in the left circ and moderate disease in the mid RCA on medical management with Imdur, ASA, and Plavix.  . Myocardial infarction [facility])     pt states he was informed per MD that he has had one but pt was unaware   . COPD (chronic obstructive pulmonary disease) ([place])   . Obesity hypoventilation syndrome (HCC)     oxygen 24/7 (2 liters [place] as of 02/2016)  . 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 OSA (obstructive sleep apnea)     not tested; pt scored 4 per stop bang tool results sent to PCP   . Essential hypertension 05/01/2007    Qualifier: Diagnosis of  By: Tiney Rouge CMA, Ellison Hughs     . Presbycusis of both ears 05/2015    [facility]  . Chronic renal insufficiency, stage III (moderate) 2017    Stage II/III (GFR around 60)  . Hyperkalemia 12/2015    Decreased ACE-I by 50% in response, then potassium normalized.    Past Surgical History  Procedure Laterality Date  . Appendectomy    .  Wrist surgery      right fx  . Shoulder surgery      right fx  . Back surgery  2001  . Carpal tunnel release Left   . Cataract extraction w/phaco Right 04/29/2013    Procedure: CATARACT EXTRACTION PHACO AND INTRAOCULAR LENS PLACEMENT (IOC);  Surgeon: Adonis Brook, MD;  Location: [facility];  Service: Ophthalmology;  Laterality: Right;  . Colonoscopy w/ polypectomy  08/2011    Many polyps--all hyperplastic, severe diverticulosis, int hem.  BioIQ hemoccult testing via [facility] 06/22/15 NEG  . Left heart catheterization with coronary angiogram N/A 10/26/2013    Procedure: LEFT HEART CATHETERIZATION WITH CORONARY ANGIOGRAM;  Surgeon: Jettie Booze, MD;  Location: [facility] CATH LAB;  Service: Cardiovascular;  Laterality: N/A;  . Tonsillectomy    . Cardiac catheterization    . Knee arthroscopy with medial menisectomy Right 12/16/2014    Procedure: RIGHT KNEE ARTHROSCOPY WITH MEDIAL MENISECTOMY microfracture medial femoral condyle abrasion condroplasty medial femoral condyle lateral menisectomy;  Surgeon: Tobi Bastos, MD;  [facility];  Service: Orthopedics;  Laterality: Right;    Family History  Problem Relation Age of Onset  . Aneurysm Mother   . Alcohol abuse Father   . Colon cancer Neg Hx    Social History:  reports that he quit smoking about 32 years ago. His smoking use included Cigarettes, Pipe, and Cigars. He has a 45 pack-year smoking history. His smokeless tobacco use includes Chew. He reports that he does not drink alcohol or use illicit drugs.  Allergies: No Known Allergies  Results for orders placed or performed during the hospital encounter of 03/15/16 (from the past 48 hour(s))  CBG monitoring, ED     Status: Abnormal   Collection Time: 03/15/16  9:47 AM  Result Value Ref Range   Glucose-Capillary 114 (H) 65 - 99 mg/dL  I-stat troponin, ED     Status: None   Collection Time: 03/15/16 10:03 AM  Result Value Ref Range   Troponin i, poc 0.01 0.00 - 0.08 ng/mL   Comment 3            Comment: Due to the release kinetics of cTnI, a negative result within the first hours of the onset of symptoms does not rule out myocardial infarction with certainty. If myocardial infarction is still suspected, repeat the test at appropriate intervals.   Basic metabolic panel     Status: Abnormal   Collection Time: 03/15/16 10:15 AM  Result Value Ref Range   Sodium 137 135 - 145 mmol/L   Potassium 4.6 3.5 - 5.1 mmol/L   Chloride 97 (L) 101 - 111 mmol/L   CO2 28 22 - 32 mmol/L   Glucose, Bld 120 (H) 65 - 99 mg/dL   BUN 18 6 - 20 mg/dL   Creatinine, Ser 0.96 0.61 - 1.24 mg/dL   Calcium 9.6 8.9 - 10.3 mg/dL   GFR calc  non Af Amer >60 >60 mL/min   GFR calc Af Amer >60 >60 mL/min    Comment: (NOTE) The eGFR has been calculated using the CKD EPI equation. This calculation has not been validated in all clinical situations. eGFR's persistently <60 mL/min signify possible Chronic Kidney Disease.    Anion gap 12 5 - 15  CBC     Status: None   Collection Time: 03/15/16 10:15 AM  Result Value Ref Range   WBC 9.0 4.0 - 10.5 K/uL   RBC 4.73 4.22 - 5.81 MIL/uL   Hemoglobin 13.3  13.0 - 17.0 g/dL   HCT 42.1 39.0 - 52.0 %   MCV 89.0 78.0 - 100.0 fL   MCH 28.1 26.0 - 34.0 pg   MCHC 31.6 30.0 - 36.0 g/dL   RDW 15.0 11.5 - 15.5 %   Platelets 245 150 - 400 K/uL  Brain natriuretic peptide     Status: None   Collection Time: 03/15/16 10:15 AM  Result Value Ref Range   B Natriuretic Peptide 7.4 0.0 - 100.0 pg/mL   Dg Chest 2 View  03/15/2016  CLINICAL DATA:  70 year old male with shortness of breath, chest tightness onset today. Initial encounter. Former smoker. EXAM: CHEST  2 VIEW COMPARISON:  12/08/2014 and earlier. FINDINGS: Stable lung volumes. Mediastinal contours are stable and within normal limits. Visualized tracheal air column is within normal limits. No pneumothorax, pulmonary edema, pleural effusion or confluent pulmonary opacity. Stable visualized osseous structures. Chronic left clavicle midshaft deformity. Widespread degenerative osteophytosis in the spine. IMPRESSION: No acute cardiopulmonary abnormality. Electronically Signed   By: Genevie Ann M.D.   On: 03/15/2016 10:21   ROS:  General: Denies fever, chills, fatigue, and diaphoresis.  Respiratory: Denies SOB, cough, DOE, chest tightness, and wheezing.   Cardiovascular: Admits to chest tightness (resolved). Denies chest pain and palpitations.  Gastrointestinal: Admits to dysphagia and odynophagia. Denies nausea, vomiting, abdominal pain, diarrhea, constipation.  Genitourinary: Denies dysuria, urgency, frequency. Endocrine: Denies polyuria, and  polydipsia. Musculoskeletal: Denies myalgias, back pain.  Skin: Denies rash and wounds.  Neurological: Denies dizziness, headaches, weakness, lightheadedness Psychiatric/Behavioral: Admits to agitation. Denies sleep disturbance  OBJECTIVE:   Vitals:   Filed Vitals:   03/15/16 1221 03/15/16 1230 03/15/16 1315 03/15/16 1506  BP: 109/64 109/61 109/59 157/115  Pulse: 83 81 79 71  Temp:      TempSrc:      Resp: _0 SpO2: 97% 95% 95% 100%   TELEMETRY: Reviewed telemetry pt in sinus rhythm.  PHYSICAL EXAM General: Vital signs reviewed.  Patient is morbidly obese, in no acute distress and cooperative with exam.  Head: Normocephalic and atraumatic. Eyes: conjunctivae normal, no scleral icterus.  Neck: Supple, trachea midline, no JVD, masses, no carotid bruit present.  Cardiovascular: RRR, S1 normal, S2 normal, no murmurs, gallops, or rubs. No reproducible pain with palpation. Pulmonary/Chest: Mild inspiratory crackles in lower lung fields bilaterally, no rhonchi or wheezing. Abdominal: Soft, non-tender, non-distended, BS +, obese.  Extremities: 1+ lower extremity edema bilaterally Neurological: Awake and alert, answers questions appropriately Skin: Warm, dry and intact.  Psychiatric: Agitated mood and affect. Cognition and memory are grossly normal.   LABS: Basic Metabolic Panel:  Recent Labs  03/15/16 1015  NA 137  K 4.6  CL 97*  CO2 28  GLUCOSE 120*  BUN 18  CREATININE 0.96  CALCIUM 9.6   CBC:  Recent Labs  03/15/16 1015  WBC 9.0  HGB 13.3  HCT 42.1  MCV 89.0  PLT 245   Coag Panel:   Lab Results  Component Value Date   INR 1.0 10/20/2013   INR 1.0 12/07/2008    Assessment/Plan [name] is a 70 yo male with PMHx of CAD, Chronic HFpEF, Obesity Hypoventilation Syndrome, Morbid Obesity on Home O2, Chronic LE Edema, HTN, HLD, T2DM, and Morbid Obesity who presents to the ED with complaint of chest pain.  Chest Tightness and Dyspnea: Atypical  non-radiating left sided chest tightness associated with shortness of breath occuring during sleep that lasted for 30 minutes. No recurrent chest tightness. In  the ED, vitals signs stable- initially hypertensive which improved. EKG showed RBBB, sinus rhythm, no ST/TW changes, similar to prior EKG. Troponin 0.01. CXR without acute abnormalities. BNP 7.4. Heart score 4-5. Patient has know CAD diagnosed on cath in 2014 which is managed medically. Differential includes cardiac, dCHF, MSK or GI related given his dysphagia and odynophagia. Will admit patient to tele obs for ACS rule out. If troponins and EKG and echo normal, will discharge to home tomorrow. Given atypical pain, do not feel patient needs cath. Stress test unlikely to be helpful given h/o intermediate result in past. May need outpatient cath if continues to be symptomatic. -Admit to telemetry obs -Cycle troponins -Repeat echocardiogram -Repeat EKG tomorrow am -Continue telemetry -GI cocktail x 1 -Protonix 40 mg daily  -Continue ASA 81 mg daily -Continue Plavix 75 mg daily -Increase Imdur to 60 mg qPM -Continue atorvastatin 80 mg daily  CAD: LHC on 09/2013 showed severely diseased and chronically occluded LAD and diagonal with right to left collaterals, mild disease in the left circ and moderate disease in the mid RCA on medical management with Imdur and Plavix. It was recommended that if symptoms continue to consider PCI of CTO of the LAD. On recent office visit on 5/15, patient denied any anginal symptoms. Patient is on ASA 81 mg daily, atorvastatin 80 mg daily, plavix 75 mg daily, imdur 30 mg daily at home. -Continue ASA 81 mg daily -Continue Plavix 75 mg daily -Continue Imdur 30 mg daily -Continue atorvastatin 80 mg daily  HLD: Recent lipid profile from 03/08/16 showed total coholesterdol 108, TG 184, HDL 30, LDL 41. Patient is on atorvastatin 80 mg daily and Zetia 10 mg daily. -Continue atorvastatin 80 mg daily -Continue Zetia 10 mg  daily  HTN: Initially hypertensive at 165/135 on admission, which normalized. Patient is now in the 100s/60s. He is on diltiazem 120 mg daily, imdur 30 mg daily, lisinopril 40 mg daily at home.  -Continue Cardizem CD 120 mg daily -Continue Imdur 30 mg daily -Continue Lisinopril 40 mg daily  Chronic HFpEF: TTE from 2014 showed moderate LVH. There was vigorous systolic function, 28% to 70%. Patient is on Torsemide 20 mg daily, Cardizem 120 mg daily, Imdur 30 mg daily, Lisinopril 40 mg daily at home. BNP is 7.4 on admission, no evidence of vascular congestion or edema on CXR. -Continue Torsemide 20 mg daily -Continue Cardizem CD 120 mg daily -Continue Imdur 30 mg daily -Continue Lisinopril 40 mg daily  COPD with Obesity Hypoventilation Syndrome: On chronic oxygen 2 liters via [place].  -Continue home 2L of oxygen via [place]  T2DM: A1c 6.1 on 03/08/16. Well controlled on home regimen of Metformin 1000 mg BID, Humalog 24 units TID WC and Humalin 120 units QAM and 50 units QPM. -Carb mod diet -CBG ACHS -SSI-M ACHS -Continue scheduled insulin at reduced doses -Hold metformin  Chronic Lower Extremity Edema: On torsemide 20 mg daily and TED hose at home. -Continue Torsemide 20 mg daily -Continue TED hose  DVT/PE ppx: Heparin TID FEN: [place], DO PGY-2 Internal Medicine Resident Pager # [phone] 03/15/2016 3:53 PM

## 2016-03-15 NOTE — ED Provider Notes (Signed)
Medical screening examination/treatment/procedure(s) were conducted as a shared visit with non-physician practitioner(s) and myself.  I personally evaluated the patient during the encounter.   EKG Interpretation   Date/Time:  Thursday Mar 15 2016 09:34:38 EDT Ventricular Rate:  95 PR Interval:  145 QRS Duration: 152 QT Interval:  377 QTC Calculation: 474 R Axis:   35 Text Interpretation:  Sinus rhythm Right bundle branch block No  significant change since last tracing Confirmed by Zenia Resides  MD, Joyleen Haselton  ([id number]) on 03/15/2016 9:48:33 AM Also confirmed by Zenia Resides  MD, Darlis Wragg  ([id number]), editor Stout CT, Leda Gauze [phone])  on 03/15/2016 10:03:41 AM     Patient here complaining of exertional chest pain shortness of breath was last for possibly 30 minutes. Called his cardiology and told to come in for further evaluation. Patient's EKG is unchanged his chest pain-free and we will consult cardiology  Lacretia Leigh, MD 03/15/16 1152

## 2016-03-15 NOTE — Telephone Encounter (Signed)
error 

## 2016-03-15 NOTE — Telephone Encounter (Signed)
New message    Pt c/o Shortness Of Breath: STAT if SOB developed within the last 24 hours or pt is noticeably SOB on the phone  1. Are you currently SOB (can you hear that pt is SOB on the phone)? no  2. How long have you been experiencing SOB? This morning   3. Are you SOB when sitting or when up moving around? Along with chest tightness   4. Are you currently experiencing any other symptoms? Dull headache chest tightness ( pt stated not pain)

## 2016-03-15 NOTE — ED Notes (Addendum)
Pt reports he woke up this morning at 0530 with [facility] and chest tightness on the left side that lasted aprox. 30 minutes.  Pt reports he called his cardiologist who referred him to ED for further testing.  Pt is on 2L of O2 at home.

## 2016-03-15 NOTE — Telephone Encounter (Addendum)
Spoke with pt and he states that he woke up this morning at 5:30AM unable to take a deep breath and had chest tightness. Pt unable to check vitals.  Pt is wearing his O2. Says chest tightness radiates into left side.  Has had HA off and on for last 2 days.  States last 2-3 days hurts when he swallows. Pt states that he still has some chest tightness right now but it has eased off some. Has not taken any nitro. Pt said he did not have these problems when he was in the office to see Dr. Radford Pax on 5/15.   Advised pt to go to ED for evaluation with new onset of tightness and worsening SOB. Will route to Dr. Radford Pax to make her aware.

## 2016-03-15 NOTE — Telephone Encounter (Signed)
Noted agree

## 2016-03-15 NOTE — ED Notes (Signed)
Cardiology at bedside.

## 2016-03-15 NOTE — Telephone Encounter (Signed)
Patient is experiencing a tightness in his chest & is having trouble swallowing. Feels like he has phlegm in his throat that he cannot cough up. He is also experiencing SOB. Patient is experiencing no pain. Patient asked whether he should make an appointment here or call his cardiologist. Dr. Anitra Lauth recommended he contact his cardiologist.

## 2016-03-16 ENCOUNTER — Observation Stay (HOSPITAL_COMMUNITY): Payer: Medicare Other

## 2016-03-16 ENCOUNTER — Other Ambulatory Visit: Payer: Self-pay | Admitting: Cardiology

## 2016-03-16 DIAGNOSIS — R079 Chest pain, unspecified: Secondary | ICD-10-CM

## 2016-03-16 DIAGNOSIS — I10 Essential (primary) hypertension: Secondary | ICD-10-CM | POA: Diagnosis not present

## 2016-03-16 DIAGNOSIS — I25118 Atherosclerotic heart disease of native coronary artery with other forms of angina pectoris: Secondary | ICD-10-CM | POA: Diagnosis not present

## 2016-03-16 DIAGNOSIS — R0600 Dyspnea, unspecified: Secondary | ICD-10-CM | POA: Diagnosis not present

## 2016-03-16 DIAGNOSIS — E1169 Type 2 diabetes mellitus with other specified complication: Secondary | ICD-10-CM | POA: Diagnosis not present

## 2016-03-16 LAB — CBC
HCT: 41.1 % (ref 39.0–52.0)
HEMOGLOBIN: 12.5 g/dL — AB (ref 13.0–17.0)
MCH: 26.8 pg (ref 26.0–34.0)
MCHC: 30.4 g/dL (ref 30.0–36.0)
MCV: 88.2 fL (ref 78.0–100.0)
PLATELETS: 214 10*3/uL (ref 150–400)
RBC: 4.66 MIL/uL (ref 4.22–5.81)
RDW: 15.1 % (ref 11.5–15.5)
WBC: 9.2 10*3/uL (ref 4.0–10.5)

## 2016-03-16 LAB — BASIC METABOLIC PANEL
ANION GAP: 8 (ref 5–15)
BUN: 17 mg/dL (ref 6–20)
CALCIUM: 9.4 mg/dL (ref 8.9–10.3)
CO2: 31 mmol/L (ref 22–32)
CREATININE: 1.04 mg/dL (ref 0.61–1.24)
Chloride: 98 mmol/L — ABNORMAL LOW (ref 101–111)
Glucose, Bld: 96 mg/dL (ref 65–99)
Potassium: 4.9 mmol/L (ref 3.5–5.1)
SODIUM: 137 mmol/L (ref 135–145)

## 2016-03-16 LAB — GLUCOSE, CAPILLARY
GLUCOSE-CAPILLARY: 114 mg/dL — AB (ref 65–99)
GLUCOSE-CAPILLARY: 167 mg/dL — AB (ref 65–99)

## 2016-03-16 LAB — TROPONIN I

## 2016-03-16 MED ORDER — ISOSORBIDE MONONITRATE ER 60 MG PO TB24: 60.0000 mg | ORAL_TABLET | Freq: Every day | ORAL | Status: DC

## 2016-03-16 MED ORDER — TORSEMIDE 20 MG PO TABS: 20.0000 mg | ORAL_TABLET | Freq: Every day | ORAL | Status: DC

## 2016-03-16 NOTE — Discharge Summary (Signed)
Discharge Summary    Patient ID: [name],  MRN: [id number], DOB/AGE: [birth year] 70 y.o.  Admit date: 03/15/2016 Discharge date: 03/16/2016  Primary Care Provider: MCGOWEN,PHILIP H Primary Cardiologist: Dr. Radford Pax  Discharge Diagnoses    Active Problems:   Chest pain   Chest pain with moderate risk for cardiac etiology   Coronary artery disease involving native coronary artery   PND (paroxysmal nocturnal dyspnea)   Dyslipidemia associated with type 2 diabetes mellitus (HCC)   DM (diabetes mellitus) type II controlled peripheral vascular disorder (HCC)   Allergies No Known Allergies  Diagnostic Studies/Procedures     _____________   History of Present Illness     [name] is a 70 yo male with PMHx of CAD (severely diseased and chronically occluded LAD and diagonal with right to left collaterals, mild disease in the left circ and moderate disease in the mid RCA on medical management with Imdur and Plavix), Chronic HFpEF, Obesity Hypoventilation Syndrome, Morbid Obesity on Home O2, Chronic LE Edema, HTN, HLD, T2DM, and Morbid Obesity who presents to the ED with complaint of chest pain.  Hospital Course     [name] is a 70 yo male patient of Dr. Landis Gandy' with PMH of CAD (severely diseased and chronically occluded LAD and diagonal with right to left collaterals, mild disease in the left circ and moderate disease in the mid RCA on medical management with Imdur and Plavix), Chronic HFpEF, Obesity Hypoventilation Syndrome, Morbid Obesity on Home O2, Chronic LE Edema, HTN, HLD, T2DM, and Morbid Obesity who presented to the [facility] ED with c/o non-radiating chest tightness that started after taking his morning medications. Also reported orthopnea, along with dysphagia and odynophagia over the past couple of days. On admission his blood pressure was initially elevates but has since improved. EKG showed NSR with RBBB and no acute changes. Chest x-ray was negative.  He  was admitted over night for observation with trops cycled and negative x3, along with all other labs being stable. He was started back on torsemide and his Imdur was increased to 60mg  daily on admission. Good UOP with this change with net output pf 1.8L and stable Cr. On 03/16/2016 he reported no chest pain/dyspnea during the night into the morning. States he was up and ambulated around without any complaints. Morning labs were stable, along with well controlled Cbgs and vital signs. He refused to have inpatient echo done, so this will be scheduled as outpatient.  He was able to walk the hallways of the unit without any c/o chest pain or dyspnea on the day of discharge.   I have arranged outpatient follow up with Dr. Radford Pax. During his admission his Imdur was increased to 60mg  daily, along with his torsemide restarted with parameters given to dose an extra pill for PRN weight gain.   The patient was seen and assessed by Dr. Ellyn Hack on 03/16/2016 and determined stable for discharge. _____________  Discharge Vitals Blood pressure 118/62, pulse 84, temperature 97.9 F (36.6 C), temperature source Oral, resp. rate 18, height 5\' 9"  (1.753 m), weight 310 lb 6.4 oz (140.797 kg), SpO2 99 %.  Filed Weights   03/15/16 1700 03/16/16 0531  Weight: 310 lb 6.4 oz (140.797 kg) 310 lb 6.4 oz (140.797 kg)    Labs & Radiologic Studies    CBC  Recent Labs  03/15/16 1015 03/16/16 0431  WBC 9.0 9.2  HGB 13.3 12.5*  HCT 42.1 41.1  MCV 89.0 88.2  PLT 245  [id number]   Basic Metabolic Panel  Recent Labs  03/15/16 1015 03/16/16 0431  NA 137 137  K 4.6 4.9  CL 97* 98*  CO2 28 31  GLUCOSE 120* 96  BUN 18 17  CREATININE 0.96 1.04  CALCIUM 9.6 9.4   Cardiac Enzymes  Recent Labs  03/15/16 1916 03/15/16 2300  TROPONINI <0.03 <0.03   _____________  Dg Chest 2 View  03/15/2016  CLINICAL DATA:  70 year old male with shortness of breath, chest tightness onset today. Initial encounter. Former smoker. EXAM:  CHEST  2 VIEW COMPARISON:  12/08/2014 and earlier. FINDINGS: Stable lung volumes. Mediastinal contours are stable and within normal limits. Visualized tracheal air column is within normal limits. No pneumothorax, pulmonary edema, pleural effusion or confluent pulmonary opacity. Stable visualized osseous structures. Chronic left clavicle midshaft deformity. Widespread degenerative osteophytosis in the spine. IMPRESSION: No acute cardiopulmonary abnormality. Electronically Signed   By: Genevie Ann M.D.   On: 03/15/2016 10:21   Disposition   Pt is being discharged home today in good condition.  Follow-up Plans & Appointments    Follow-up Information    Follow up with Fransico Him, MD On 03/22/2016.   Specialty:  Cardiology   Why:  10:30am for your hospital follow up   Contact information:   [address] [place] [address] [phone]        Discharge Medications   Current Discharge Medication List    CONTINUE these medications which have CHANGED   Details  isosorbide mononitrate (IMDUR) 60 MG 24 hr tablet Take 1 tablet (60 mg total) by mouth at bedtime. Qty: 30 tablet, Refills: 11    torsemide (DEMADEX) 20 MG tablet Take 1 tablet (20 mg total) by mouth daily. Take extra tab as directed for weight gain. Qty: 45 tablet, Refills: 11      CONTINUE these medications which have NOT CHANGED   Details  aspirin 81 MG tablet Take 81 mg by mouth daily.      atorvastatin (LIPITOR) 80 MG tablet Take 1 tablet (80 mg total) by mouth daily. Qty: 90 tablet, Refills: 3    baclofen (LIORESAL) 10 MG tablet Take 0.5 tablets (5 mg total) by mouth 3 (three) times daily. Qty: 30 each, Refills: 3   Associated Diagnoses: Spasm of muscle, back    beta carotene w/minerals (OCUVITE) tablet Take 1 tablet by mouth daily.    cetirizine (ZYRTEC) 10 MG tablet Take 10 mg by mouth daily.    citalopram (CELEXA) 20 MG tablet Take 1 tablet (20 mg total) by mouth daily. Qty: 90 tablet, Refills:  3    clonazePAM (KLONOPIN) 1 MG tablet Take 1 tablet (1 mg total) by mouth 2 (two) times daily as needed for anxiety. Qty: 60 tablet, Refills: 5    clopidogrel (PLAVIX) 75 MG tablet TAKE ONE TABLET BY MOUTH ONCE DAILY WITH BREAKFAST Qty: 30 tablet, Refills: 6    clotrimazole-betamethasone (LOTRISONE) cream APPLY  CREAM TOPICALLY TO AFFECTED AREA TWICE DAILY Qty: 45 g, Refills: 6    cromolyn (OPTICROM) 4 % ophthalmic solution Place 1 drop into both eyes 4 (four) times daily.    diltiazem (CARDIZEM CD) 120 MG 24 hr capsule TAKE ONE CAPSULE BY MOUTH ONCE DAILY Qty: 30 capsule, Refills: 5    ezetimibe (ZETIA) 10 MG tablet Take 1 tablet (10 mg total) by mouth daily. Qty: 30 tablet, Refills: 11    fluticasone (CUTIVATE) 0.05 % cream Apply to lower leg rash bid prn Qty: 30 g, Refills:  1    gabapentin (NEURONTIN) 300 MG capsule TAKE ONE CAPSULE BY MOUTH THREE TIMES DAILY Qty: 270 capsule, Refills: 3    glucose blood (ONE TOUCH ULTRA TEST) test strip TEST 3 TIMES A DAY AS DIRECTED Qty: 100 each, Refills: 11    HUMALOG KWIKPEN 200 UNIT/ML SOPN INJECT 24 UNITS SUBCUTANEOUSLY THREE TIMES DAILY BEFORE MEALS Qty: 15 mL, Refills: 6    HUMULIN N KWIKPEN 100 UNIT/ML Kiwkpen INJECT 120 UNITS SUBCUTANEOUSLY WITH BREAKFAST AND 50 UNITS SUBCUTANEOUSLY WITH SUPPER Qty: 60 mL, Refills: 6    hydrOXYzine (ATARAX/VISTARIL) 25 MG tablet 1 tab at supper and 1 at bedtime (for insomnia) Qty: 60 tablet, Refills: 11    lisinopril (PRINIVIL,ZESTRIL) 40 MG tablet TAKE ONE TABLET BY MOUTH ONCE DAILY Qty: 90 tablet, Refills: 3    metFORMIN (GLUCOPHAGE) 1000 MG tablet TAKE ONE TABLET BY MOUTH TWICE DAILY WITH MEALS Qty: 60 tablet, Refills: 6    Omega-3 Fatty Acids ([employer] FISH OIL) 1000 MG CAPS Take 2 tablets by mouth 2 (two) times daily. Qty: 90 capsule    ONETOUCH DELICA LANCETS [id number] MISC 1 each by Other route 3 (three) times daily. USE TO TEST 3 TIMES A DAY Qty: 100 each, Refills: 11             Outstanding Labs/Studies   Check BMET at office visit  Duration of Discharge Encounter   Greater than 30 minutes including physician time.  Signed, Reino Bellis NP-C 03/16/2016, 12:32 PM   I have seen, examined and evaluated the patient this Am along with Reino Bellis, NP on AM rounds.  After reviewing all the available data and chart, we discussed the patients laboratory, study & physical findings as well as symptoms in detail. I agree with her findings, examination as well as impression recommendations as per our discussion.    He did relatively well overnight. No further episodes of chest discomfort or orthopnea/PND. Risk diuresis with torsemide yesterday. I suspect that his episode was probably related to both of heart failure/diastolic dysfunction mediated PND. He ruled out for MI. I think he misunderstood the echocardiogram for nuclear stress test and declined doing the stress the echo this morning. I don't think that should keep him in the hospital. He was able to ambulate in the hallway back to his baseline. I think at this point he is probably stable for discharge. He will need close follow-up with Dr. Radford Pax in order to determine if any further evaluation needs to be done. I don't think a stress test would be helpful however. We will order an outpatient echo before his follow-up appointment.  We increased Imdur to 60 mg daily. Changes torsemide prescription to 1-2 tablet daily as directed and gave additional 15 tablets per month. Basically he used to take an extra dose if he has waking up roughly 33 pounds or more per day. I also suggested that he may be just depicted day or so during the week to take an additional dose.    Glenetta Hew, M.D., M.S. Interventional Cardiologist   Pager # [phone] Phone # [phone] [address] [place], [place] [address]

## 2016-03-16 NOTE — Care Management Obs Status (Signed)
[place] NOTIFICATION   Patient Details  Name: MILEK CRISSMAN MRN: [id number] Date of Birth: 06/13/1946   Medicare Observation Status Notification Given:  Yes    Royston Bake, RN 03/16/2016, 10:50 AM

## 2016-03-16 NOTE — Discharge Instructions (Signed)
Remember to be weighing yourself first thing in the morning everyday to watch out for weight gain. Continue with your low sodium diet.

## 2016-03-16 NOTE — Progress Notes (Signed)
Pt ambulated to nurses station without difficulty.

## 2016-03-16 NOTE — Consult Note (Signed)
   [facility] CM Inpatient Consult   03/16/2016  [name] [birth year] [id number]  Patient was screened and evaluated for [place] Management services.  Patient admitted under observations with chest pain with a HX of HF.    Met with the patient, wife, and daughter regarding the benefits of [facility] Care Management services.  Explained that [facility] is a covered benefit of his [facility]. Review information for [facility] Care Management and a folder was provided with contact information.  Explained that [facility] Management does not interfere with or replace any services arranged by the inpatient care management staff.  Patient states he has just gotten a new scale so that he will be able to weigh now.  He states he has no trouble getting to appointments.  He states, "I don't feel like it is necessary for anybody to call me or come see me."   Patient declined services with [facility]. Patient did accept the brochure with contact information. For questions, please contact:  Natividad Brood, RN BSN [facility] Liaison  [phone] business mobile phone Toll free office [phone]

## 2016-03-16 NOTE — Progress Notes (Signed)
Patient Name: [name] Date of Encounter: 03/16/2016  Hospital Problem List     Active Problems:   Chest pain   Chest pain with moderate risk for cardiac etiology   Coronary artery disease involving native coronary artery   PND (paroxysmal nocturnal dyspnea)   Dyslipidemia associated with type 2 diabetes mellitus (HCC)   DM (diabetes mellitus) type II controlled peripheral vascular disorder (HCC)    Subjective   Feeling well this morning. Wants to go home.  Inpatient Medications    . aspirin  81 mg Oral Daily  . atorvastatin  80 mg Oral q1800  . citalopram  20 mg Oral Daily  . clopidogrel  75 mg Oral Daily  . cromolyn  1 drop Both Eyes QID  . diltiazem  120 mg Oral Daily  . ezetimibe  10 mg Oral Daily  . gabapentin  300 mg Oral TID  . gi cocktail  30 mL Oral Once  . heparin  5,000 Units Subcutaneous Q8H  . insulin aspart  0-15 Units Subcutaneous TID WC  . insulin aspart  17 Units Subcutaneous TID WC  . insulin NPH Human  30 Units Subcutaneous QAC supper  . isosorbide mononitrate  60 mg Oral QHS  . lisinopril  40 mg Oral Daily  . pantoprazole  40 mg Oral Daily  . sodium chloride flush  3 mL Intravenous Q12H  . torsemide  20 mg Oral Daily    Vital Signs    Filed Vitals:   03/15/16 1700 03/15/16 2100 03/16/16 0531 03/16/16 0737  BP: 139/61 144/62 100/58 118/62  Pulse: 87 92 83 84  Temp:  98 F (36.7 C) 98.1 F (36.7 C) 97.9 F (36.6 C)  TempSrc:  Oral Oral Oral  Resp:  18 18 18   Height: 5\' 9"  (1.753 m)     Weight: 310 lb 6.4 oz (140.797 kg)  310 lb 6.4 oz (140.797 kg)   SpO2: 97% 92% 95% 99%    Intake/Output Summary (Last 24 hours) at 03/16/16 0907 Last data filed at 03/16/16 0819  Gross per 24 hour  Intake    600 ml  Output   2050 ml  Net  -1450 ml   Filed Weights   03/15/16 1700 03/16/16 0531  Weight: 310 lb 6.4 oz (140.797 kg) 310 lb 6.4 oz (140.797 kg)    Physical Exam    General: Pleasant obese older male, NAD. Neuro: Alert and  oriented X 3. Moves all extremities spontaneously. Psych: Normal affect. HEENT:  Normal  Neck: Supple without bruits or JVD. Lungs:  Resp regular and unlabored, Mild crackles in the lower lung fields.  Heart: RRR no s3, s4, or murmurs. Abdomen: Soft, non-tender, non-distended, BS + x 4.  Extremities: No clubbing, cyanosis, 1+ edema. DP/PT/Radials 2+ and equal bilaterally.  Labs    CBC  Recent Labs  03/15/16 1015 03/16/16 0431  WBC 9.0 9.2  HGB 13.3 12.5*  HCT 42.1 41.1  MCV 89.0 88.2  PLT 245 Q000111Q   Basic Metabolic Panel  Recent Labs  03/15/16 1015 03/16/16 0431  NA 137 137  K 4.6 4.9  CL 97* 98*  CO2 28 31  GLUCOSE 120* 96  BUN 18 17  CREATININE 0.96 1.04  CALCIUM 9.6 9.4   Cardiac Enzymes  Recent Labs  03/15/16 1916 03/15/16 2300  TROPONINI <0.03 <0.03    Telemetry    SR Rate-80s   ECG    SR RBBB No significant changes from previous readings.   Radiology  Assessment & Plan    [name] is a 70 yo male with PMHx of CAD (severely diseased and chronically occluded LAD and diagonal with right to left collaterals, mild disease in the left circ and moderate disease in the mid RCA on medical management with Imdur and Plavix), Chronic HFpEF, Obesity Hypoventilation Syndrome, Morbid Obesity on Home O2, Chronic LE Edema, HTN, HLD, T2DM, and Morbid Obesity who presents to the ED with complaint of chest pain.  1. Chest Tightness and Dyspnea: Reports feeling well through the night without any c/o chest pain/pressure or dyspnea. Has ambulated around the room without any c/o. Given his reports this morning, it seems his symptoms are more consistent with GERD/CHF instead of his CAD.  --His Imdur was increased yesterday --Continue other current medications. --started back on torsemide   2. CAD: LHC on 09/2013 showed severely diseased and chronically occluded LAD and diagonal with right to left collaterals, mild disease in the left circ and moderate disease in  the mid RCA on medical management with Imdur and Plavix. It was recommended that if symptoms continue to consider PCI of CTO of the LAD. On recent office visit on 5/15, patient denied any anginal symptoms. Patient is on ASA 81 mg daily, atorvastatin 80 mg daily, plavix 75 mg daily, imdur 30 mg daily at home. -Continue ASA 81 mg daily -Continue Plavix 75 mg daily -Increase Imdur 60 mg daily -Continue atorvastatin 80 mg daily  3. HLD: Recent lipid profile from 03/08/16 showed total coholesterdol 108, TG 184, HDL 30, LDL 41. Patient is on atorvastatin 80 mg daily and Zetia 10 mg daily. -Continue atorvastatin 80 mg daily -Continue Zetia 10 mg daily  4. HTN: Bp with better control this morning. Will continue with current medications.   5. Chronic HFpEF: TTE from 2014 showed moderate LVH. There was vigorous systolic function, 123456 to 70%. Patient is on Torsemide 20 mg daily, Cardizem 120 mg daily, Imdur 30 mg daily, Lisinopril 40 mg daily at home. BNP is 7.4 on admission, no evidence of vascular congestion or edema on CXR. -Continue Torsemide 20 mg daily -Continue Cardizem CD 120 mg daily -Increase Imdur 60 mg daily -Continue Lisinopril 40 mg daily  6. COPD with Obesity Hypoventilation Syndrome: On chronic oxygen 2 liters via [place].  -Continue home 2L of oxygen via [place]  7. T2DM: A1c 6.1 on 03/08/16. Well controlled on home regimen of Metformin 1000 mg BID, Humalog 24 units TID WC and Humalin 120 units QAM and 50 units QPM. -Carb mod diet -Cbgs well controlled. -SSI-M ACHS -Continue scheduled insulin at reduced doses -Hold metformin  8. Chronic Lower Extremity Edema: On torsemide 20 mg daily and TED hose at home. -Continue Torsemide 20 mg daily -Continue TED hose   Signed, Reino Bellis NP-C Pager [phone]

## 2016-03-22 ENCOUNTER — Ambulatory Visit (INDEPENDENT_AMBULATORY_CARE_PROVIDER_SITE_OTHER): Payer: Medicare Other | Admitting: Cardiology

## 2016-03-22 ENCOUNTER — Encounter: Payer: Self-pay | Admitting: Cardiology

## 2016-03-22 VITALS — BP 100/56 | HR 100 | Ht 69.0 in | Wt 318.0 lb

## 2016-03-22 DIAGNOSIS — R1310 Dysphagia, unspecified: Secondary | ICD-10-CM

## 2016-03-22 DIAGNOSIS — E785 Hyperlipidemia, unspecified: Secondary | ICD-10-CM

## 2016-03-22 DIAGNOSIS — I10 Essential (primary) hypertension: Secondary | ICD-10-CM

## 2016-03-22 DIAGNOSIS — I2510 Atherosclerotic heart disease of native coronary artery without angina pectoris: Secondary | ICD-10-CM

## 2016-03-22 DIAGNOSIS — I5032 Chronic diastolic (congestive) heart failure: Secondary | ICD-10-CM | POA: Diagnosis not present

## 2016-03-22 NOTE — Patient Instructions (Signed)
Medication Instructions:  Your physician recommends that you continue on your current medications as directed. Please refer to the Current Medication list given to you today.   Labwork: None  Testing/Procedures: Your physician has requested that you have an echocardiogram. Echocardiography is a painless test that uses sound waves to create images of your heart. It provides your doctor with information about the size and shape of your heart and how well your heart's chambers and valves are working. This procedure takes approximately one hour. There are no restrictions for this procedure.  Follow-Up: You have been referred to Dr. Hilarie Fredrickson for dysphagia.   Your physician wants you to follow-up in: 6 months with Dr. Radford Pax. You will receive a reminder letter in the mail two months in advance. If you don't receive a letter, please call our office to schedule the follow-up appointment.   Any Other Special Instructions Will Be Listed Below (If Applicable).     If you need a refill on your cardiac medications before your next appointment, please call your pharmacy.

## 2016-03-22 NOTE — Progress Notes (Signed)
Cardiology Office Note    Date:  03/22/2016   ID:  [name], DOB Sep 12, 1946, MRN [id number]  PCP:  [name] Sou, MD  Cardiologist:  [name] Him, MD   Chief Complaint  Patient presents with  . Coronary Artery Disease  . Hypertension  . Hyperlipidemia    History of Present Illness:  [name] is a 70 y.o. male with a history of obesity hypoventilation syndrome on home O2, HTN, dyslipidemia, morbid obesity and chronic LE edema which is controlled with TED hose stockings and diuretic. He has chronic diastolic CHF controlled on diuretics. He has ASCAD with severely diseased and chronically occluded LAD and diagonal with right to left collaterals, mild disease in the left circ and moderate disease in the mid RCA on medical management with Imdur and Plavix. He presents back today for followup. He was recently in the hospital with chest pain after taking his am meds.  He was also having some orthopnea.  On admit his BP was elevated.  Cardiac workup was normal.  His mdur was increased to 60mg  daily.  His Torsemide was increased as well. He has a 2D echo outpt pending as he refused to have this done inpt.  He is doing well today. He has chronic SOB related to his underlying obesity hypoventilation and is on home O2 and thinks his DOE is back to baseline. He has chronic LE edema that is stable. He denies any chest pain, dizziness, palpitations or syncope.he says that he has been having problems with dysphagia when he eats. It never occurs at any other time and doesn't occur everytime he eats.     Past Medical History  Diagnosis Date  . DIABETES MELLITUS, TYPE II 06/24/2007    No diab retpthy as of 08/05/15 eye exam.  . HYPERLIPIDEMIA 06/24/2007  . HYPERTENSION 05/01/2007  . Hypoxemia 01/27/2010  . OBESITY 09/02/2008  . OSTEOARTHRITIS 05/01/2007  . Chronic diastolic CHF (congestive heart failure) ([place]) 02/25/2009  . Asthma     as a child  . Cataract     multiple types,  bilateral  . History of kidney stones   . Pruritic condition 05/2014    Allergist summer 2015, no new testing.  . Dermatitis 05/2014  . Coronary artery disease     chronically occluded LAD and diagonal with right to left collaterals, mild disease in the left circ and moderate disease in the mid RCA on medical management with Imdur, ASA, and Plavix.  . Myocardial infarction [facility])     pt states he was informed per MD that he has had one but pt was unaware   . COPD (chronic obstructive pulmonary disease) ([place])   . Obesity hypoventilation syndrome (HCC)     oxygen 24/7 (2 liters [place] as of 02/2016)  . 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 OSA (obstructive sleep apnea)     not tested; pt scored 4 per stop bang tool results sent to PCP   . Essential hypertension 05/01/2007    Qualifier: Diagnosis of  By: [name] CMA, [name]     . Presbycusis of both ears 05/2015    [facility]  . Chronic renal insufficiency, stage III (moderate) 2017    Stage II/III (GFR around 60)  . Hyperkalemia 12/2015    Decreased ACE-I by 50% in response, then potassium normalized.    Past Surgical History  Procedure Laterality Date  . Appendectomy    .  Wrist surgery      right fx  . Shoulder surgery      right fx  . Back surgery  2001  . Carpal tunnel release Left   . Cataract extraction w/phaco Right 04/29/2013    Procedure: CATARACT EXTRACTION PHACO AND INTRAOCULAR LENS PLACEMENT (IOC);  Surgeon: [name] Brook, MD;  Location: [place];  Service: Ophthalmology;  Laterality: Right;  . Colonoscopy w/ polypectomy  08/2011    Many polyps--all hyperplastic, severe diverticulosis, int hem.  BioIQ hemoccult testing via [facility] 06/22/15 NEG  . Left heart catheterization with coronary angiogram N/A 10/26/2013    Procedure: LEFT HEART CATHETERIZATION WITH CORONARY ANGIOGRAM;  Surgeon: [name] Booze, MD;  Location: [facility] CATH LAB;  Service: Cardiovascular;  Laterality:  N/A;  . Tonsillectomy    . Cardiac catheterization    . Knee arthroscopy with medial menisectomy Right 12/16/2014    Procedure: RIGHT KNEE ARTHROSCOPY WITH MEDIAL MENISECTOMY microfracture medial femoral condyle abrasion condroplasty medial femoral condyle lateral menisectomy;  Surgeon: [name] Bastos, MD;  Location: [facility];  Service: Orthopedics;  Laterality: Right;    Current Medications: Outpatient Prescriptions Prior to Visit  Medication Sig Dispense Refill  . aspirin 81 MG tablet Take 81 mg by mouth daily.      Marland Kitchen atorvastatin (LIPITOR) 80 MG tablet Take 1 tablet (80 mg total) by mouth daily. 90 tablet 3  . baclofen (LIORESAL) 10 MG tablet Take 0.5 tablets (5 mg total) by mouth 3 (three) times daily. 30 each 3  . beta carotene w/minerals (OCUVITE) tablet Take 1 tablet by mouth daily.    . cetirizine (ZYRTEC) 10 MG tablet Take 10 mg by mouth daily.    . citalopram (CELEXA) 20 MG tablet Take 1 tablet (20 mg total) by mouth daily. 90 tablet 3  . clonazePAM (KLONOPIN) 1 MG tablet Take 1 tablet (1 mg total) by mouth 2 (two) times daily as needed for anxiety. 60 tablet 5  . clopidogrel (PLAVIX) 75 MG tablet TAKE ONE TABLET BY MOUTH ONCE DAILY WITH BREAKFAST 30 tablet 6  . clotrimazole-betamethasone (LOTRISONE) cream APPLY  CREAM TOPICALLY TO AFFECTED AREA TWICE DAILY 45 g 6  . cromolyn (OPTICROM) 4 % ophthalmic solution Place 1 drop into both eyes 4 (four) times daily.    Marland Kitchen diltiazem (CARDIZEM CD) 120 MG 24 hr capsule TAKE ONE CAPSULE BY MOUTH ONCE DAILY 30 capsule 5  . ezetimibe (ZETIA) 10 MG tablet Take 1 tablet (10 mg total) by mouth daily. 30 tablet 11  . fluticasone (CUTIVATE) 0.05 % cream Apply to lower leg rash bid prn 30 g 1  . gabapentin (NEURONTIN) 300 MG capsule TAKE ONE CAPSULE BY MOUTH THREE TIMES DAILY 270 capsule 3  . glucose blood (ONE TOUCH ULTRA TEST) test strip TEST 3 TIMES A DAY AS DIRECTED 100 each 11  . HUMALOG KWIKPEN 200 UNIT/ML SOPN INJECT 24 UNITS SUBCUTANEOUSLY  THREE TIMES DAILY BEFORE MEALS 15 mL 6  . HUMULIN N KWIKPEN 100 UNIT/ML Kiwkpen INJECT 120 UNITS SUBCUTANEOUSLY WITH BREAKFAST AND 50 UNITS SUBCUTANEOUSLY WITH SUPPER 60 mL 6  . hydrOXYzine (ATARAX/VISTARIL) 25 MG tablet 1 tab at supper and 1 at bedtime (for insomnia) 60 tablet 11  . isosorbide mononitrate (IMDUR) 60 MG 24 hr tablet Take 1 tablet (60 mg total) by mouth at bedtime. 30 tablet 11  . lisinopril (PRINIVIL,ZESTRIL) 40 MG tablet TAKE ONE TABLET BY MOUTH ONCE DAILY 90 tablet 3  . metFORMIN (GLUCOPHAGE) 1000 MG tablet TAKE ONE TABLET BY MOUTH  TWICE DAILY WITH MEALS 60 tablet 6  . Omega-3 Fatty Acids ([employer] FISH OIL) 1000 MG CAPS Take 2 tablets by mouth 2 (two) times daily. 90 capsule   . ONETOUCH DELICA LANCETS 99991111 MISC 1 each by Other route 3 (three) times daily. USE TO TEST 3 TIMES A DAY 100 each 11  . torsemide (DEMADEX) 20 MG tablet Take 1 tablet (20 mg total) by mouth daily. Take extra tab as directed for weight gain. 45 tablet 11   No facility-administered medications prior to visit.     Allergies:   Review of patient's allergies indicates no known allergies.   Social History   Social History  . Marital Status: Married    Spouse Name: N/A  . Number of Children: N/A  . Years of Education: N/A   Occupational History  . Retired    Social History Main Topics  . Smoking status: Former Smoker -- 1.50 packs/day for 30 years    Types: Cigarettes, Pipe, Cigars    Quit date: 10/30/1983  . Smokeless tobacco: Current User    Types: Chew  . Alcohol Use: No  . Drug Use: No  . Sexual Activity: Not Asked   Other Topics Concern  . None   Social History Narrative   Married, one son and one daughter.   His daughter and her two children live with [name].   Coffee daily.  Former smoker.  No alcohol.   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        Family History:  The patient's family history includes Alcohol abuse in his father; Aneurysm in his mother.  There is no history of Colon cancer.   ROS:   Please see the history of present illness.    ROS All other systems reviewed and are negative.   PHYSICAL EXAM:   VS:  BP 100/56 mmHg  Pulse 100  Ht 5\' 9"  (1.753 m)  Wt 318 lb (144.244 kg)  BMI 46.94 kg/m2   GEN: Well nourished, well developed, in no acute distress HEENT: normal Neck: no JVD, carotid bruits, or masses Cardiac: RRR; no murmurs, rubs, or gallops,no edema.  Intact distal pulses bilaterally.  Respiratory:  clear to auscultation bilaterally, normal work of breathing GI: soft, nontender, nondistended, + BS MS: no deformity or atrophy Skin: warm and dry, no rash Neuro:  Alert and Oriented x 3, Strength and sensation are intact Psych: euthymic mood, full affect  Wt Readings from Last 3 Encounters:  03/22/16 318 lb (144.244 kg)  03/16/16 310 lb 6.4 oz (140.797 kg)  03/12/16 314 lb (142.429 kg)      Studies/Labs Reviewed:   EKG:  EKG is not ordered today.    Recent Labs: 01/26/2016: Magnesium 1.6 03/12/2016: ALT 17 03/15/2016: B Natriuretic Peptide 7.4 03/16/2016: BUN 17; Creatinine, Ser 1.04; Hemoglobin 12.5*; Platelets 214; Potassium 4.9; Sodium 137   Lipid Panel    Component Value Date/Time   CHOL 108* 03/12/2016 1001   TRIG 184* 03/12/2016 1001   TRIG 268* 09/06/2006 1555   HDL 30* 03/12/2016 1001   CHOLHDL 3.6 03/12/2016 1001   CHOLHDL 4.8 CALC 09/06/2006 1555   VLDL 37* 03/12/2016 1001   LDLCALC 41 03/12/2016 1001   LDLDIRECT 74.0 04/15/2015 0838   LDLDIRECT 96.1 09/06/2006 1555    Additional studies/ records that were reviewed today include:  none    ASSESSMENT:    1. Chronic diastolic CHF (congestive heart failure) ([place])   2. Atherosclerosis of native  coronary artery of native heart without angina pectoris   3. Essential hypertension   4. Dyslipidemia   5. Dysphagia      PLAN:  In order of problems listed above:  1. Chronic diastolic CHF - Class 2B - appears euvolemic on exam.  I will  get a 2D echo to make sure his LVF is still normal given recent volume overload on hospitalization.  Continue CCB/ACE I and diuretic.  2. ASCAD with chronically occluded LAD and diag with moderate disease of RCA and mild disease of LCx with right to left collaterals.  He has not had any angina since going up on nitrates.  Continue ASA/Plavix/statin/imdur 3. HTN - BP controlled on current medical regimen. Continue CCB/ACE I 4. Dyslipidemia - LDL goal < 70.  Continue statin.  LDL at goal at 41.   5. Dysphagia - I will refer [name] to Dr. Hilarie Fredrickson for evluation.  This seems to occur only with eating and taking pills    Medication Adjustments/Labs and Tests Ordered: Current medicines are reviewed at length with the patient today.  Concerns regarding medicines are outlined above.  Medication changes, Labs and Tests ordered today are listed in the Patient Instructions below.  There are no Patient Instructions on file for this visit.   Signed, [name] Him, MD  03/22/2016 10:47 AM    [facility] [address], [place], [name]  [address] Phone: [phone]; Fax: [phone]

## 2016-04-07 ENCOUNTER — Other Ambulatory Visit: Payer: Self-pay | Admitting: Family Medicine

## 2016-04-07 DIAGNOSIS — J449 Chronic obstructive pulmonary disease, unspecified: Secondary | ICD-10-CM | POA: Diagnosis not present

## 2016-04-09 ENCOUNTER — Other Ambulatory Visit: Payer: Self-pay

## 2016-04-09 ENCOUNTER — Ambulatory Visit (HOSPITAL_COMMUNITY): Payer: Medicare Other | Attending: Cardiovascular Disease

## 2016-04-09 ENCOUNTER — Encounter: Payer: Self-pay | Admitting: Family Medicine

## 2016-04-09 DIAGNOSIS — I252 Old myocardial infarction: Secondary | ICD-10-CM | POA: Insufficient documentation

## 2016-04-09 DIAGNOSIS — I059 Rheumatic mitral valve disease, unspecified: Secondary | ICD-10-CM | POA: Insufficient documentation

## 2016-04-09 DIAGNOSIS — E785 Hyperlipidemia, unspecified: Secondary | ICD-10-CM | POA: Diagnosis not present

## 2016-04-09 DIAGNOSIS — I5032 Chronic diastolic (congestive) heart failure: Secondary | ICD-10-CM | POA: Insufficient documentation

## 2016-04-09 DIAGNOSIS — I110 Hypertensive heart disease with heart failure: Secondary | ICD-10-CM | POA: Insufficient documentation

## 2016-04-09 DIAGNOSIS — Z6841 Body Mass Index (BMI) 40.0 and over, adult: Secondary | ICD-10-CM | POA: Insufficient documentation

## 2016-04-09 DIAGNOSIS — I2510 Atherosclerotic heart disease of native coronary artery without angina pectoris: Secondary | ICD-10-CM | POA: Insufficient documentation

## 2016-04-09 DIAGNOSIS — E119 Type 2 diabetes mellitus without complications: Secondary | ICD-10-CM | POA: Diagnosis not present

## 2016-04-09 LAB — ECHOCARDIOGRAM COMPLETE
AVLVOTPG: 3 mmHg
Ao-asc: 35 cm
CHL CUP MV DEC (S): 345
CHL CUP RV SYS PRESS: 20 mmHg
EERAT: 5.1
EWDT: 345 ms
FS: 29 % (ref 28–44)
IV/PV OW: 1.6
LA ID, A-P, ES: 40 mm
LA diam index: 1.47 cm/m2
LAVOLA4C: 51 mL
LDCA: 4.15 cm2
LEFT ATRIUM END SYS DIAM: 40 mm
LV E/e' medial: 5.1
LV E/e'average: 5.1
LV PW d: 9.43 mm — AB (ref 0.6–1.1)
LV TDI E'LATERAL: 11.5
LV TDI E'MEDIAL: 4.97
LVELAT: 11.5 cm/s
LVOT SV: 77 mL
LVOT VTI: 18.6 cm
LVOT peak vel: 93.2 cm/s
LVOTD: 23 mm
Lateral S' vel: 17 cm/s
MV pk E vel: 58.7 m/s
MVPKAVEL: 95.8 m/s
Reg peak vel: 205 cm/s
TR max vel: 205 cm/s

## 2016-04-09 NOTE — Telephone Encounter (Signed)
RF request for clopidogrel  LOV: 03/08/16  Next ov: 07/10/16 Last written: 09/12/15 #30 w/ 6RF

## 2016-04-10 ENCOUNTER — Encounter: Payer: Self-pay | Admitting: Gastroenterology

## 2016-04-10 ENCOUNTER — Ambulatory Visit (INDEPENDENT_AMBULATORY_CARE_PROVIDER_SITE_OTHER): Payer: Medicare Other | Admitting: Gastroenterology

## 2016-04-10 VITALS — BP 102/56 | HR 104 | Ht 68.0 in | Wt 314.4 lb

## 2016-04-10 DIAGNOSIS — Z9981 Dependence on supplemental oxygen: Secondary | ICD-10-CM | POA: Diagnosis not present

## 2016-04-10 DIAGNOSIS — R1310 Dysphagia, unspecified: Secondary | ICD-10-CM

## 2016-04-10 DIAGNOSIS — Z794 Long term (current) use of insulin: Secondary | ICD-10-CM

## 2016-04-10 DIAGNOSIS — E119 Type 2 diabetes mellitus without complications: Secondary | ICD-10-CM | POA: Diagnosis not present

## 2016-04-10 DIAGNOSIS — I25119 Atherosclerotic heart disease of native coronary artery with unspecified angina pectoris: Secondary | ICD-10-CM | POA: Diagnosis not present

## 2016-04-10 DIAGNOSIS — IMO0001 Reserved for inherently not codable concepts without codable children: Secondary | ICD-10-CM | POA: Insufficient documentation

## 2016-04-10 NOTE — Patient Instructions (Signed)
You have been scheduled for a Barium Esophogram at [facility] (1st floor of the hospital) on Monday 04-16-2016 at 10:30 am. Please arrive at 10:15 minutes  to your appointment for registration. Make certain not to have anything to eat or drink 3 hours prior to your test. If you need to reschedule for any reason, please contact radiology at [phone] to do so. __________________________________________________________________ A barium swallow is an examination that concentrates on views of the esophagus. This tends to be a double contrast exam (barium and two liquids which, when combined, create a gas to distend the wall of the oesophagus) or single contrast (non-ionic iodine based). The study is usually tailored to your symptoms so a good history is essential. Attention is paid during the study to the form, structure and configuration of the esophagus, looking for functional disorders (such as aspiration, dysphagia, achalasia, motility and reflux) EXAMINATION You may be asked to change into a gown, depending on the type of swallow being performed. A radiologist and radiographer will perform the procedure. The radiologist will advise you of the type of contrast selected for your procedure and direct you during the exam. You will be asked to stand, sit or lie in several different positions and to hold a small amount of fluid in your mouth before being asked to swallow while the imaging is performed .In some instances you may be asked to swallow barium coated marshmallows to assess the motility of a solid food bolus. The exam can be recorded as a digital or video fluoroscopy procedure. POST PROCEDURE It will take 1-2 days for the barium to pass through your system. To facilitate this, it is important, unless otherwise directed, to increase your fluids for the next 24-48hrs and to resume your normal diet.  This test typically takes about 30 minutes to  perform. __________________________________________________________________________________

## 2016-04-10 NOTE — Progress Notes (Addendum)
04/10/2016 [name] [id number] 02/25/1946   HISTORY OF PRESENT ILLNESS:  This is a 70 year old male who is known to Dr. Hilarie Fredrickson 2012 for colonoscopy. He has been referred to our office on this occasion by Dr. Radford Pax, his cardiologist, for evaluation of dysphagia. He has multiple medical problems including insulin-dependent diabetes mellitus, hypertension, hyperlipidemia, coronary artery disease on Plavix, chronic diastolic congestive heart failure, COPD and obesity hypoventilation syndrome on chronic oxygen, and chronic kidney disease stage III.  He tells me that he has been experiencing issues swallowing solid food and sometimes pills for the past month or so. It has worsened over the course of that time. He denies any issues with swallowing liquids. He denies any similar issues in the past.  He says that the food seems to be getting stuck in the middle of his chest. Sometimes this sensation lasts for a couple of hours but then eventually passes. It causes pressure and prevents him from eating any further.  Past Medical History  Diagnosis Date  . DIABETES MELLITUS, TYPE II 06/24/2007    No diab retpthy as of 08/05/15 eye exam.  . HYPERLIPIDEMIA 06/24/2007  . HYPERTENSION 05/01/2007  . Hypoxemia 01/27/2010  . OBESITY 09/02/2008  . OSTEOARTHRITIS 05/01/2007  . Chronic diastolic CHF (congestive heart failure) ([place]) 02/25/2009  . Asthma     as a child  . Cataract     multiple types, bilateral  . History of kidney stones   . Pruritic condition 05/2014    Allergist summer 2015, no new testing.  . Dermatitis 05/2014  . Coronary artery disease     chronically occluded LAD and diagonal with right to left collaterals, mild disease in the left circ and moderate disease in the mid RCA on medical management with Imdur, ASA, and Plavix.  . Myocardial infarction [facility])     pt states he was informed per MD that he has had one but pt was unaware   . COPD (chronic obstructive pulmonary disease) ([place])   .  Obesity hypoventilation syndrome (HCC)     oxygen 24/7 (2 liters [place] as of 02/2016)  . 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 OSA (obstructive sleep apnea)     not tested; pt scored 4 per stop bang tool results sent to PCP   . Essential hypertension 05/01/2007    Qualifier: Diagnosis of  By: Tiney Rouge CMA, Ellison Hughs     . Presbycusis of both ears 05/2015    [facility]  . Chronic renal insufficiency, stage III (moderate) 2017    Stage II/III (GFR around 60)  . Hyperkalemia 12/2015    Decreased ACE-I by 50% in response, then potassium normalized.   Past Surgical History  Procedure Laterality Date  . Appendectomy    . Wrist surgery Right     right fx  . Shoulder surgery Right     right fx  . Lumbar disc surgery  2001  . Carpal tunnel release Left   . Cataract extraction w/phaco Right 04/29/2013    Procedure: CATARACT EXTRACTION PHACO AND INTRAOCULAR LENS PLACEMENT (IOC);  Surgeon: Adonis Brook, MD;  Location: [place];  Service: Ophthalmology;  Laterality: Right;  . Colonoscopy w/ polypectomy  08/2011    Many polyps--all hyperplastic, severe diverticulosis, int hem.  BioIQ hemoccult testing via [facility] 06/22/15 NEG  . Left heart catheterization with coronary angiogram N/A 10/26/2013    Procedure: LEFT HEART  CATHETERIZATION WITH CORONARY ANGIOGRAM;  Surgeon: Jettie Booze, MD;  [facility] CATH LAB;  Service: Cardiovascular;  Laterality: N/A;  . Tonsillectomy    . Cardiac catheterization    . Knee arthroscopy with medial menisectomy Right 12/16/2014    Procedure: RIGHT KNEE ARTHROSCOPY WITH MEDIAL MENISECTOMY microfracture medial femoral condyle abrasion condroplasty medial femoral condyle lateral menisectomy;  Surgeon: Tobi Bastos, MD;  [facility];  Service: Orthopedics;  Laterality: Right;  . Transthoracic echocardiogram  03/2016    EF 55-60%, grade I DD, LAE    reports that he quit smoking about 32 years ago. His smoking  use included Cigarettes, Pipe, and Cigars. He has a 45 pack-year smoking history. His smokeless tobacco use includes Chew. He reports that he does not drink alcohol or use illicit drugs. family history includes Alcohol abuse in his father; Aneurysm in his father; Diabetes in his maternal aunt; Heart attack in his mother. There is no history of Colon cancer. No Known Allergies    Outpatient Encounter Prescriptions as of 04/10/2016  Medication Sig  . aspirin 81 MG tablet Take 81 mg by mouth daily.    Marland Kitchen atorvastatin (LIPITOR) 80 MG tablet Take 1 tablet (80 mg total) by mouth daily.  . baclofen (LIORESAL) 10 MG tablet Take 0.5 tablets (5 mg total) by mouth 3 (three) times daily.  . beta carotene w/minerals (OCUVITE) tablet Take 1 tablet by mouth daily.  . cetirizine (ZYRTEC) 10 MG tablet Take 10 mg by mouth daily.  . citalopram (CELEXA) 20 MG tablet Take 1 tablet (20 mg total) by mouth daily.  . clonazePAM (KLONOPIN) 1 MG tablet Take 1 tablet (1 mg total) by mouth 2 (two) times daily as needed for anxiety.  . clopidogrel (PLAVIX) 75 MG tablet TAKE ONE TABLET BY MOUTH ONCE DAILY WITH BREAKFAST  . clotrimazole-betamethasone (LOTRISONE) cream APPLY  CREAM TOPICALLY TO AFFECTED AREA TWICE DAILY  . cromolyn (OPTICROM) 4 % ophthalmic solution Place 1 drop into both eyes 4 (four) times daily.  Marland Kitchen diltiazem (CARDIZEM CD) 120 MG 24 hr capsule TAKE ONE CAPSULE BY MOUTH ONCE DAILY  . ezetimibe (ZETIA) 10 MG tablet Take 1 tablet (10 mg total) by mouth daily.  . fluticasone (CUTIVATE) 0.05 % cream Apply to lower leg rash bid prn  . gabapentin (NEURONTIN) 300 MG capsule TAKE ONE CAPSULE BY MOUTH THREE TIMES DAILY  . glucose blood (ONE TOUCH ULTRA TEST) test strip TEST 3 TIMES A DAY AS DIRECTED  . HUMALOG KWIKPEN 200 UNIT/ML SOPN INJECT 24 UNITS SUBCUTANEOUSLY THREE TIMES DAILY BEFORE MEALS  . HUMULIN N KWIKPEN 100 UNIT/ML Kiwkpen INJECT 120 UNITS SUBCUTANEOUSLY WITH BREAKFAST AND 50 UNITS SUBCUTANEOUSLY WITH  SUPPER  . hydrOXYzine (ATARAX/VISTARIL) 25 MG tablet 1 tab at supper and 1 at bedtime (for insomnia)  . isosorbide mononitrate (IMDUR) 60 MG 24 hr tablet Take 1 tablet (60 mg total) by mouth at bedtime.  Marland Kitchen lisinopril (PRINIVIL,ZESTRIL) 40 MG tablet Take 20 mg by mouth daily.  . metFORMIN (GLUCOPHAGE) 1000 MG tablet TAKE ONE TABLET BY MOUTH TWICE DAILY WITH MEALS  . Omega-3 Fatty Acids ([employer] FISH OIL) 1000 MG CAPS Take 2 tablets by mouth 2 (two) times daily.  Glory Rosebush DELICA LANCETS 99991111 MISC 1 each by Other route 3 (three) times daily. USE TO TEST 3 TIMES A DAY  . OXYGEN Inhale 2 L/min into the lungs continuous.  . torsemide (DEMADEX) 20 MG tablet Take 1 tablet (20 mg total) by mouth daily. Take extra tab as  directed for weight gain.  . [DISCONTINUED] lisinopril (PRINIVIL,ZESTRIL) 40 MG tablet TAKE ONE TABLET BY MOUTH ONCE DAILY   No facility-administered encounter medications on file as of 04/10/2016.     REVIEW OF SYSTEMS  : All other systems reviewed and negative except where noted in the History of Present Illness.   PHYSICAL EXAM: BP 102/56 mmHg  Pulse 104  Ht 5\' 8"  (1.727 m)  Wt 314 lb 6 oz (142.6 kg)  BMI 47.81 kg/m2 General: Well developed white male in no acute distress Head: Normocephalic and atraumatic Eyes:  Sclerae anicteric, conjunctiva pink. Ears: Normal auditory acuity Lungs: Clear throughout to auscultation Heart:  Slightly tachy. Abdomen: Soft, obese, non-distended.  Normal bowel sounds.  Non-tender. Musculoskeletal: Symmetrical with no gross deformities  Skin: No lesions on visible extremities Extremities: No edema  Neurological: Alert oriented x 4, grossly non-focal Psychological:  Alert and cooperative. Normal mood and affect  ASSESSMENT AND PLAN: -Dysphagia:  To solids primarily.  Patient is on antiplatelet and chronic O2 which makes him high risk for procedure.  We'll start by checking an esophagram to delineate what the actual issue is, i.e., stricture or  ring versus dysmotility, etc.  For now he was advised that he needs to take caution with eating, taking small bites, eating slowly, chewing well, and taking sips between bites. -IDDM -CAD on Plavix -COPD/obesity hypoventilation syndrome:  On 2 Liters O2   CC:  McGowen, Adrian Blackwater, MD  CC:  Dr. Fransico Him   Addendum: Reviewed and agree with initial management. Jerene Bears, MD

## 2016-04-11 ENCOUNTER — Telehealth: Payer: Self-pay | Admitting: Cardiology

## 2016-04-11 NOTE — Telephone Encounter (Signed)
Follow Up:   Returning calls from Monday evening and yesterday.

## 2016-04-11 NOTE — Telephone Encounter (Signed)
-----   Message from Sueanne Margarita, MD sent at 04/09/2016  4:37 PM EDT ----- Echo showed normal LVF with increased stiffness of heart muscle

## 2016-04-11 NOTE — Telephone Encounter (Signed)
Informed patient of results and verbal understanding expressed.  

## 2016-04-16 ENCOUNTER — Ambulatory Visit (HOSPITAL_COMMUNITY)
Admission: RE | Admit: 2016-04-16 | Discharge: 2016-04-16 | Disposition: A | Discharge status: Home / Self Care | Payer: Medicare Other | Source: Ambulatory Visit | Attending: Gastroenterology | Admitting: Gastroenterology

## 2016-04-16 DIAGNOSIS — Z794 Long term (current) use of insulin: Secondary | ICD-10-CM | POA: Insufficient documentation

## 2016-04-16 DIAGNOSIS — K222 Esophageal obstruction: Secondary | ICD-10-CM | POA: Insufficient documentation

## 2016-04-16 DIAGNOSIS — IMO0001 Reserved for inherently not codable concepts without codable children: Secondary | ICD-10-CM

## 2016-04-16 DIAGNOSIS — E119 Type 2 diabetes mellitus without complications: Secondary | ICD-10-CM | POA: Insufficient documentation

## 2016-04-16 DIAGNOSIS — I25119 Atherosclerotic heart disease of native coronary artery with unspecified angina pectoris: Secondary | ICD-10-CM | POA: Diagnosis not present

## 2016-04-16 DIAGNOSIS — Z9981 Dependence on supplemental oxygen: Secondary | ICD-10-CM | POA: Insufficient documentation

## 2016-04-16 DIAGNOSIS — R1310 Dysphagia, unspecified: Secondary | ICD-10-CM | POA: Diagnosis not present

## 2016-04-16 DIAGNOSIS — K228 Other specified diseases of esophagus: Secondary | ICD-10-CM | POA: Insufficient documentation

## 2016-04-18 ENCOUNTER — Telehealth: Payer: Self-pay | Admitting: *Deleted

## 2016-04-18 ENCOUNTER — Encounter: Payer: Self-pay | Admitting: *Deleted

## 2016-04-18 NOTE — Telephone Encounter (Signed)
Patient wants to schedule the EGD with dil at [facility] with Dr. Hilarie Fredrickson. (05/04/16)

## 2016-04-18 NOTE — Telephone Encounter (Signed)
Forwarded to Leone Payor, RN.

## 2016-04-18 NOTE — Telephone Encounter (Signed)
OK to hold Plavix for procedure. 

## 2016-04-18 NOTE — Telephone Encounter (Signed)
  04/18/2016   RE: [name] DOB: [birth year] MRN: [id number]  Dear  Dr. Radford Pax,  We have scheduled the above patient for an endoscopic procedure. Our records show that he is on anticoagulation therapy.  Please advise as to whether the patient may hold Plavix for 5 days prior to the procedure, which is scheduled for 05/04/16.  Please route your response to Leone Payor, RN.  Sincerely, Leone Payor

## 2016-04-19 ENCOUNTER — Other Ambulatory Visit: Payer: Self-pay | Admitting: *Deleted

## 2016-04-19 DIAGNOSIS — R1310 Dysphagia, unspecified: Secondary | ICD-10-CM

## 2016-04-19 NOTE — Telephone Encounter (Signed)
Spoke with [name] and scheduled patient on 05/04/16 at 9:30 AM. Received a note from Dr. Radford Pax that patient can hold Plavix x 5 days prior. Patient aware of appointment and instructions. Scheduled previsit on 04/23/16 with patient.

## 2016-04-23 ENCOUNTER — Ambulatory Visit (AMBULATORY_SURGERY_CENTER): Payer: Self-pay

## 2016-04-23 VITALS — Ht 68.0 in | Wt 316.8 lb

## 2016-04-23 DIAGNOSIS — R1310 Dysphagia, unspecified: Secondary | ICD-10-CM

## 2016-04-23 NOTE — Progress Notes (Signed)
No allergies to eggs or soy No diet meds  No past problems with anesthesia HOME O2  Has email and internet; registered emmi

## 2016-04-25 ENCOUNTER — Encounter (HOSPITAL_COMMUNITY): Payer: Self-pay | Admitting: *Deleted

## 2016-04-28 DIAGNOSIS — C159 Malignant neoplasm of esophagus, unspecified: Secondary | ICD-10-CM

## 2016-04-28 HISTORY — DX: Malignant neoplasm of esophagus, unspecified: C15.9

## 2016-05-02 ENCOUNTER — Other Ambulatory Visit: Payer: Medicare Other

## 2016-05-04 ENCOUNTER — Encounter (HOSPITAL_COMMUNITY)
Admission: RE | Disposition: A | Discharge status: Home / Self Care | Payer: Self-pay | Source: Ambulatory Visit | Attending: Internal Medicine

## 2016-05-04 ENCOUNTER — Ambulatory Visit (HOSPITAL_COMMUNITY): Payer: Medicare Other | Admitting: Anesthesiology

## 2016-05-04 ENCOUNTER — Other Ambulatory Visit: Payer: Self-pay

## 2016-05-04 ENCOUNTER — Ambulatory Visit (HOSPITAL_COMMUNITY)
Admission: RE | Admit: 2016-05-04 | Discharge: 2016-05-04 | Disposition: A | Discharge status: Home / Self Care | Payer: Medicare Other | Source: Ambulatory Visit | Attending: Internal Medicine | Admitting: Internal Medicine

## 2016-05-04 ENCOUNTER — Encounter (HOSPITAL_COMMUNITY): Payer: Self-pay | Admitting: *Deleted

## 2016-05-04 DIAGNOSIS — K2289 Other specified disease of esophagus: Secondary | ICD-10-CM

## 2016-05-04 DIAGNOSIS — K228 Other specified diseases of esophagus: Secondary | ICD-10-CM

## 2016-05-04 DIAGNOSIS — R1310 Dysphagia, unspecified: Secondary | ICD-10-CM

## 2016-05-04 DIAGNOSIS — E785 Hyperlipidemia, unspecified: Secondary | ICD-10-CM | POA: Insufficient documentation

## 2016-05-04 DIAGNOSIS — N183 Chronic kidney disease, stage 3 (moderate): Secondary | ICD-10-CM | POA: Diagnosis not present

## 2016-05-04 DIAGNOSIS — M1990 Unspecified osteoarthritis, unspecified site: Secondary | ICD-10-CM | POA: Diagnosis not present

## 2016-05-04 DIAGNOSIS — J449 Chronic obstructive pulmonary disease, unspecified: Secondary | ICD-10-CM | POA: Insufficient documentation

## 2016-05-04 DIAGNOSIS — Z7982 Long term (current) use of aspirin: Secondary | ICD-10-CM | POA: Diagnosis not present

## 2016-05-04 DIAGNOSIS — E662 Morbid (severe) obesity with alveolar hypoventilation: Secondary | ICD-10-CM | POA: Diagnosis not present

## 2016-05-04 DIAGNOSIS — E1122 Type 2 diabetes mellitus with diabetic chronic kidney disease: Secondary | ICD-10-CM | POA: Insufficient documentation

## 2016-05-04 DIAGNOSIS — I5032 Chronic diastolic (congestive) heart failure: Secondary | ICD-10-CM | POA: Insufficient documentation

## 2016-05-04 DIAGNOSIS — Z9981 Dependence on supplemental oxygen: Secondary | ICD-10-CM | POA: Insufficient documentation

## 2016-05-04 DIAGNOSIS — Z7902 Long term (current) use of antithrombotics/antiplatelets: Secondary | ICD-10-CM | POA: Insufficient documentation

## 2016-05-04 DIAGNOSIS — Z79899 Other long term (current) drug therapy: Secondary | ICD-10-CM | POA: Insufficient documentation

## 2016-05-04 DIAGNOSIS — Z89022 Acquired absence of left finger(s): Secondary | ICD-10-CM | POA: Insufficient documentation

## 2016-05-04 DIAGNOSIS — K319 Disease of stomach and duodenum, unspecified: Secondary | ICD-10-CM | POA: Insufficient documentation

## 2016-05-04 DIAGNOSIS — E1151 Type 2 diabetes mellitus with diabetic peripheral angiopathy without gangrene: Secondary | ICD-10-CM | POA: Insufficient documentation

## 2016-05-04 DIAGNOSIS — K3189 Other diseases of stomach and duodenum: Secondary | ICD-10-CM | POA: Diagnosis not present

## 2016-05-04 DIAGNOSIS — Z6841 Body Mass Index (BMI) 40.0 and over, adult: Secondary | ICD-10-CM | POA: Insufficient documentation

## 2016-05-04 DIAGNOSIS — I130 Hypertensive heart and chronic kidney disease with heart failure and stage 1 through stage 4 chronic kidney disease, or unspecified chronic kidney disease: Secondary | ICD-10-CM | POA: Diagnosis not present

## 2016-05-04 DIAGNOSIS — Z794 Long term (current) use of insulin: Secondary | ICD-10-CM | POA: Insufficient documentation

## 2016-05-04 DIAGNOSIS — F17220 Nicotine dependence, chewing tobacco, uncomplicated: Secondary | ICD-10-CM | POA: Diagnosis not present

## 2016-05-04 DIAGNOSIS — I2510 Atherosclerotic heart disease of native coronary artery without angina pectoris: Secondary | ICD-10-CM | POA: Diagnosis not present

## 2016-05-04 DIAGNOSIS — C160 Malignant neoplasm of cardia: Secondary | ICD-10-CM | POA: Diagnosis not present

## 2016-05-04 DIAGNOSIS — I252 Old myocardial infarction: Secondary | ICD-10-CM | POA: Diagnosis not present

## 2016-05-04 DIAGNOSIS — K2960 Other gastritis without bleeding: Secondary | ICD-10-CM | POA: Diagnosis not present

## 2016-05-04 HISTORY — DX: Dependence on supplemental oxygen: Z99.81

## 2016-05-04 HISTORY — DX: Malignant neoplasm of esophagus, unspecified: C15.9

## 2016-05-04 HISTORY — PX: BALLOON DILATION: SHX5330

## 2016-05-04 HISTORY — PX: ESOPHAGOGASTRODUODENOSCOPY: SHX5428

## 2016-05-04 LAB — GLUCOSE, CAPILLARY: GLUCOSE-CAPILLARY: 110 mg/dL — AB (ref 65–99)

## 2016-05-04 SURGERY — EGD (ESOPHAGOGASTRODUODENOSCOPY)
Anesthesia: Monitor Anesthesia Care

## 2016-05-04 MED ORDER — SODIUM CHLORIDE 0.9 % IV SOLN: INTRAVENOUS | Status: DC

## 2016-05-04 MED ORDER — LACTATED RINGERS IV SOLN
INTRAVENOUS | Status: DC
  Administered 2016-05-04: 09:00:00 via INTRAVENOUS

## 2016-05-04 MED ORDER — PROPOFOL 10 MG/ML IV BOLUS: INTRAVENOUS | Status: DC | PRN

## 2016-05-04 MED ORDER — SUCRALFATE 1 GM/10ML PO SUSP: 1.0000 g | Freq: Four times a day (QID) | ORAL | Status: DC

## 2016-05-04 MED ORDER — PROPOFOL 10 MG/ML IV BOLUS
INTRAVENOUS | Status: DC | PRN
  Administered 2016-05-04 (×4): 20 mg via INTRAVENOUS
  Administered 2016-05-04: 10 mg via INTRAVENOUS
  Administered 2016-05-04: 20 mg via INTRAVENOUS
  Administered 2016-05-04: 30 mg via INTRAVENOUS
  Administered 2016-05-04 (×4): 20 mg via INTRAVENOUS

## 2016-05-04 MED ORDER — PANTOPRAZOLE SODIUM 40 MG PO TBEC: 40.0000 mg | DELAYED_RELEASE_TABLET | Freq: Two times a day (BID) | ORAL | Status: DC

## 2016-05-04 MED ORDER — PROPOFOL 10 MG/ML IV BOLUS
INTRAVENOUS | Status: AC
  Filled 2016-05-04: qty 20

## 2016-05-04 MED ORDER — PROPOFOL 10 MG/ML IV BOLUS
INTRAVENOUS | Status: AC
Start: 2016-05-04 — End: 2016-05-04
  Filled 2016-05-04: qty 20

## 2016-05-04 NOTE — Transfer of Care (Signed)
Immediate Anesthesia Transfer of Care Note  Patient: [name]  Procedure(s) Performed: Procedure(s): ESOPHAGOGASTRODUODENOSCOPY (EGD) (N/A) BALLOON DILATION (N/A)  Patient Location: PACU  Anesthesia Type:MAC  Level of Consciousness: sedated  Airway & Oxygen Therapy: Patient Spontanous Breathing and Patient connected to nasal cannula oxygen  Post-op Assessment: Report given to RN and Post -op Vital signs reviewed and stable  Post vital signs: Reviewed and stable  Last Vitals:  Filed Vitals:   05/04/16 0856  BP: 134/64  Pulse: 87  Temp: 37.1 C  Resp: 15    Last Pain: There were no vitals filed for this visit.       Complications: No apparent anesthesia complications

## 2016-05-04 NOTE — Op Note (Signed)
[facility] Patient Name: [name] Procedure Date: 05/04/2016 MRN: [id number] Attending MD: Jerene Bears , MD Date of Birth: 1946/03/21 CSN: [id number] Age: 70 Admit Type: Outpatient Procedure:                Upper GI endoscopy Indications:              Esophageal dysphagia, Odynophagia, Abnormal                            cine-esophagram Providers:                Lajuan Lines. Hilarie Fredrickson, MD, Dustin Flock RN, RN, Corliss Parish, Technician, Derrek Gu. Alday CRNA, CRNA Referring MD:             Laroy Apple. Turner Medicines:                Monitored Anesthesia Care Complications:            No immediate complications. Estimated Blood Loss:     Estimated blood loss was minimal. Procedure:                Pre-Anesthesia Assessment:                           - Prior to the procedure, a History and Physical                            was performed, and patient medications and                            allergies were reviewed. The patient's tolerance of                            previous anesthesia was also reviewed. The risks                            and benefits of the procedure and the sedation                            options and risks were discussed with the patient.                            All questions were answered, and informed consent                            was obtained. Prior Anticoagulants: The patient has                            taken Plavix (clopidogrel), last dose was 5 days                            prior to procedure. ASA Grade Assessment: III - A  patient with severe systemic disease. After                            reviewing the risks and benefits, the patient was                            deemed in satisfactory condition to undergo the                            procedure.                           After obtaining informed consent, the endoscope was                            passed under  direct vision. Throughout the                            procedure, the patient's blood pressure, pulse, and                            oxygen saturations were monitored continuously. The                            [id number]) scope was introduced through the                            mouth, and advanced to the second part of duodenum.                            The upper GI endoscopy was accomplished without                            difficulty. The patient tolerated the procedure                            well. Scope In: Scope Out: Findings:      A large, ulcerating mass with no active bleeding and no stigmata of       recent bleeding was found at the gastroesophageal junction extending       into the gastric cardia, beginning 40 cm from the incisors. The mass was       non-obstructing and partially circumferential (involving one-half of the       lumen circumference). The mass extends approximately 5 cm. Multiple       biopsies were obtained with cold forceps for histology in a targeted       manner in the lower third of the esophagus, at the gastroesophageal       junction and in the cardia.      The large, ulcerated mass with no bleeding and no stigmata of recent       bleeding was again seen at the gastroesophageal junction and in the       cardia on retroflexed views in the stomach.      Multiple, small non-bleeding erosions were found in the gastric antrum  and in the prepyloric region of the stomach. There were no stigmata of       recent bleeding. Biopsies were taken with a cold forceps for histology       and Helicobacter pylori testing.      Localized mildly erythematous mucosa without active bleeding was found       in the duodenal bulb.      The second portion of the duodenum was normal. 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 Biopsied.                           - Non-bleeding erosive gastropathy. Biopsied.                           - Erythematous duodenopathy.                           - Normal second portion of the duodenum. Moderate Sedation:      N/A Recommendation:           - Patient has a contact number available for                            emergencies. The signs and symptoms of potential                            delayed complications were discussed with the                            patient. Return to normal activities tomorrow.                            Written discharge instructions were provided to the                            patient.                           - Soft diet.                           - Continue present medications.                           - Await pathology results.                           - Perform CT scan (computed tomography) of the                            chest, abdomen, and pelvis  at appointment to be                            scheduled.                           - Continue to hold Plavix given ulcerated mass                            found today.                           - Begin pantoprazole 40 mg twice daily before                            breakfast and dinner.                           - Begin sucralfate suspension 1 gram before meals                            and at bedtime.                           - May need EUS depending on pathology and CT                            results. Procedure Code(s):        --- Professional ---                           [phone], Esophagogastroduodenoscopy, flexible,                            transoral; with biopsy, single or multiple Diagnosis Code(s):        --- Professional ---                           D49.0, Neoplasm of unspecified behavior of                            digestive system                           K31.89, Other diseases of stomach and duodenum                           R13.14,  Dysphagia, pharyngoesophageal phase                           R93.3, Abnormal findings on diagnostic imaging of                            other parts of digestive tract CPT copyright 2016 [employer]. All rights reserved. The codes documented in this report are preliminary and upon coder review may  be revised to meet current compliance requirements. Jerene Bears, MD 05/04/2016 10:16:32 AM This  report has been signed electronically. Number of Addenda: 0

## 2016-05-04 NOTE — H&P (View-Only) (Signed)
04/10/2016 KSHAUN GARGAN [id number] [birth year]   HISTORY OF PRESENT ILLNESS:  This is a 70 year old male who is known to Dr. Hilarie Fredrickson 2012 for colonoscopy. He has been referred to our office on this occasion by Dr. Radford Pax, his cardiologist, for evaluation of dysphagia. He has multiple medical problems including insulin-dependent diabetes mellitus, hypertension, hyperlipidemia, coronary artery disease on Plavix, chronic diastolic congestive heart failure, COPD and obesity hypoventilation syndrome on chronic oxygen, and chronic kidney disease stage III.  He tells me that he has been experiencing issues swallowing solid food and sometimes pills for the past month or so. It has worsened over the course of that time. He denies any issues with swallowing liquids. He denies any similar issues in the past.  He says that the food seems to be getting stuck in the middle of his chest. Sometimes this sensation lasts for a couple of hours but then eventually passes. It causes pressure and prevents him from eating any further.  Past Medical History  Diagnosis Date  . DIABETES MELLITUS, TYPE II 06/24/2007    No diab retpthy as of 08/05/15 eye exam.  . HYPERLIPIDEMIA 06/24/2007  . HYPERTENSION 05/01/2007  . Hypoxemia 01/27/2010  . OBESITY 09/02/2008  . OSTEOARTHRITIS 05/01/2007  . Chronic diastolic CHF (congestive heart failure) ([place]) 02/25/2009  . Asthma     as a child  . Cataract     multiple types, bilateral  . History of kidney stones   . Pruritic condition 05/2014    Allergist summer 2015, no new testing.  . Dermatitis 05/2014  . Coronary artery disease     chronically occluded LAD and diagonal with right to left collaterals, mild disease in the left circ and moderate disease in the mid RCA on medical management with Imdur, ASA, and Plavix.  . Myocardial infarction [facility])     pt states he was informed per MD that he has had one but pt was unaware   . COPD (chronic obstructive pulmonary disease) ([place])   .  Obesity hypoventilation syndrome (HCC)     oxygen 24/7 (2 liters [place] as of 02/2016)  . 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 OSA (obstructive sleep apnea)     not tested; pt scored 4 per stop bang tool results sent to PCP   . Essential hypertension 05/01/2007    Qualifier: Diagnosis of  By: Tiney Rouge CMA, Ellison Hughs     . Presbycusis of both ears 05/2015    [facility]  . Chronic renal insufficiency, stage III (moderate) 2017    Stage II/III (GFR around 60)  . Hyperkalemia 12/2015    Decreased ACE-I by 50% in response, then potassium normalized.   Past Surgical History  Procedure Laterality Date  . Appendectomy    . Wrist surgery Right     right fx  . Shoulder surgery Right     right fx  . Lumbar disc surgery  2001  . Carpal tunnel release Left   . Cataract extraction w/phaco Right 04/29/2013    Procedure: CATARACT EXTRACTION PHACO AND INTRAOCULAR LENS PLACEMENT (IOC);  Surgeon: Adonis Brook, MD;  Location: [place] [facility];  Service: Ophthalmology;  Laterality: Right;  . Colonoscopy w/ polypectomy  08/2011    Many polyps--all hyperplastic, severe diverticulosis, int hem.  [employer] hemoccult testing via [facility] 06/22/15 NEG  . Left heart catheterization with coronary angiogram N/A 10/26/2013    Procedure: LEFT HEART  CATHETERIZATION WITH CORONARY ANGIOGRAM;  Surgeon: Jettie Booze, MD;  Location: [facility] CATH LAB;  Service: Cardiovascular;  Laterality: N/A;  . Tonsillectomy    . Cardiac catheterization    . Knee arthroscopy with medial menisectomy Right 12/16/2014    Procedure: RIGHT KNEE ARTHROSCOPY WITH MEDIAL MENISECTOMY microfracture medial femoral condyle abrasion condroplasty medial femoral condyle lateral menisectomy;  Surgeon: Tobi Bastos, MD;  Location: [facility];  Service: Orthopedics;  Laterality: Right;  . Transthoracic echocardiogram  03/2016    EF 55-60%, grade I DD, LAE    reports that he quit smoking about 32 years ago. His smoking  use included Cigarettes, Pipe, and Cigars. He has a 45 pack-year smoking history. His smokeless tobacco use includes Chew. He reports that he does not drink alcohol or use illicit drugs. family history includes Alcohol abuse in his father; Aneurysm in his father; Diabetes in his maternal aunt; Heart attack in his mother. There is no history of Colon cancer. No Known Allergies    Outpatient Encounter Prescriptions as of 04/10/2016  Medication Sig  . aspirin 81 MG tablet Take 81 mg by mouth daily.    Marland Kitchen atorvastatin (LIPITOR) 80 MG tablet Take 1 tablet (80 mg total) by mouth daily.  . baclofen (LIORESAL) 10 MG tablet Take 0.5 tablets (5 mg total) by mouth 3 (three) times daily.  . beta carotene w/minerals (OCUVITE) tablet Take 1 tablet by mouth daily.  . cetirizine (ZYRTEC) 10 MG tablet Take 10 mg by mouth daily.  . citalopram (CELEXA) 20 MG tablet Take 1 tablet (20 mg total) by mouth daily.  . clonazePAM (KLONOPIN) 1 MG tablet Take 1 tablet (1 mg total) by mouth 2 (two) times daily as needed for anxiety.  . clopidogrel (PLAVIX) 75 MG tablet TAKE ONE TABLET BY MOUTH ONCE DAILY WITH BREAKFAST  . clotrimazole-betamethasone (LOTRISONE) cream APPLY  CREAM TOPICALLY TO AFFECTED AREA TWICE DAILY  . cromolyn (OPTICROM) 4 % ophthalmic solution Place 1 drop into both eyes 4 (four) times daily.  Marland Kitchen diltiazem (CARDIZEM CD) 120 MG 24 hr capsule TAKE ONE CAPSULE BY MOUTH ONCE DAILY  . ezetimibe (ZETIA) 10 MG tablet Take 1 tablet (10 mg total) by mouth daily.  . fluticasone (CUTIVATE) 0.05 % cream Apply to lower leg rash bid prn  . gabapentin (NEURONTIN) 300 MG capsule TAKE ONE CAPSULE BY MOUTH THREE TIMES DAILY  . glucose blood (ONE TOUCH ULTRA TEST) test strip TEST 3 TIMES A DAY AS DIRECTED  . HUMALOG KWIKPEN 200 UNIT/ML SOPN INJECT 24 UNITS SUBCUTANEOUSLY THREE TIMES DAILY BEFORE MEALS  . HUMULIN N KWIKPEN 100 UNIT/ML Kiwkpen INJECT 120 UNITS SUBCUTANEOUSLY WITH BREAKFAST AND 50 UNITS SUBCUTANEOUSLY WITH  SUPPER  . hydrOXYzine (ATARAX/VISTARIL) 25 MG tablet 1 tab at supper and 1 at bedtime (for insomnia)  . isosorbide mononitrate (IMDUR) 60 MG 24 hr tablet Take 1 tablet (60 mg total) by mouth at bedtime.  Marland Kitchen lisinopril (PRINIVIL,ZESTRIL) 40 MG tablet Take 20 mg by mouth daily.  . metFORMIN (GLUCOPHAGE) 1000 MG tablet TAKE ONE TABLET BY MOUTH TWICE DAILY WITH MEALS  . Omega-3 Fatty Acids ([employer] FISH OIL) 1000 MG CAPS Take 2 tablets by mouth 2 (two) times daily.  Glory Rosebush DELICA LANCETS [id number] MISC 1 each by Other route 3 (three) times daily. USE TO TEST 3 TIMES A DAY  . OXYGEN Inhale 2 L/min into the lungs continuous.  . torsemide (DEMADEX) 20 MG tablet Take 1 tablet (20 mg total) by mouth daily. Take extra tab as  directed for weight gain.  . [DISCONTINUED] lisinopril (PRINIVIL,ZESTRIL) 40 MG tablet TAKE ONE TABLET BY MOUTH ONCE DAILY   No facility-administered encounter medications on file as of 04/10/2016.     REVIEW OF SYSTEMS  : All other systems reviewed and negative except where noted in the History of Present Illness.   PHYSICAL EXAM: BP 102/56 mmHg  Pulse 104  Ht 5\' 8"  (1.727 m)  Wt 314 lb 6 oz (142.6 kg)  BMI 47.81 kg/m2 General: Well developed white male in no acute distress Head: Normocephalic and atraumatic Eyes:  Sclerae anicteric, conjunctiva pink. Ears: Normal auditory acuity Lungs: Clear throughout to auscultation Heart:  Slightly tachy. Abdomen: Soft, obese, non-distended.  Normal bowel sounds.  Non-tender. Musculoskeletal: Symmetrical with no gross deformities  Skin: No lesions on visible extremities Extremities: No edema  Neurological: Alert oriented x 4, grossly non-focal Psychological:  Alert and cooperative. Normal mood and affect  ASSESSMENT AND PLAN: -Dysphagia:  To solids primarily.  Patient is on antiplatelet and chronic O2 which makes him high risk for procedure.  We'll start by checking an esophagram to delineate what the actual issue is, i.e., stricture or  ring versus dysmotility, etc.  For now he was advised that he needs to take caution with eating, taking small bites, eating slowly, chewing well, and taking sips between bites. -IDDM -CAD on Plavix -COPD/obesity hypoventilation syndrome:  On 2 Liters O2   CC:  McGowen, Adrian Blackwater, MD  CC:  Dr. Fransico Him   Addendum: Reviewed and agree with initial management. Jerene Bears, MD

## 2016-05-04 NOTE — Anesthesia Postprocedure Evaluation (Signed)
Anesthesia Post Note  Patient: [name]  Procedure(s) Performed: Procedure(s) (LRB): ESOPHAGOGASTRODUODENOSCOPY (EGD) (N/A) BALLOON DILATION (N/A)  Patient location during evaluation: PACU Anesthesia Type: MAC Level of consciousness: awake and alert Pain management: pain level controlled Vital Signs Assessment: post-procedure vital signs reviewed and stable Respiratory status: spontaneous breathing, nonlabored ventilation, respiratory function stable and patient connected to nasal cannula oxygen Cardiovascular status: stable and blood pressure returned to baseline Anesthetic complications: no    Last Vitals:  Filed Vitals:   05/04/16 1010 05/04/16 1020  BP: 97/55 114/55  Pulse: 72 99  Temp:    Resp: 15 20    Last Pain: There were no vitals filed for this visit.               [name]

## 2016-05-04 NOTE — Interval H&P Note (Signed)
History and Physical Interval Note: Pt seen recently in the office to evaluate dysphagia This is been present for about 2 months Has pain when swallowing after several bites this seems to get better Feels like food backs up into his chest Denies heartburn and nausea Has been off Plavix 5 days Had abnormal barium esophagram suggesting possible GE junction stricture Proceed to upper endoscopy with monitored anesthesia care. Possible dilation. We discussed the risks, benefits and alternatives and he is agreeable to proceed  05/04/2016 10:38 AM  [name]  has presented today for surgery, with the diagnosis of dysphagia  The various methods of treatment have been discussed with the patient and family. After consideration of risks, benefits and other options for treatment, the patient has consented to  Procedure(s): ESOPHAGOGASTRODUODENOSCOPY (EGD) (N/A) BALLOON DILATION (N/A) as a surgical intervention .  The patient's history has been reviewed, patient examined, no change in status, stable for surgery.  I have reviewed the patient's chart and labs.  Questions were answered to the patient's satisfaction.     Chesnie Capell M

## 2016-05-04 NOTE — Discharge Instructions (Signed)

## 2016-05-04 NOTE — Anesthesia Preprocedure Evaluation (Addendum)
Anesthesia Evaluation  Patient identified by MRN, date of birth, ID band Patient awake    Reviewed: Allergy & Precautions, NPO status , Patient's Chart, lab work & pertinent test results  Airway Mallampati: III  TM Distance: >3 FB Neck ROM: Full    Dental  (+) Upper Dentures, Lower Dentures   Pulmonary sleep apnea and Oxygen sleep apnea , COPD,  COPD inhaler and oxygen dependent, former smoker,     + decreased breath sounds+ wheezing      Cardiovascular hypertension, Pt. on medications + angina + CAD, + Past MI, + Peripheral Vascular Disease, +CHF, + PND and + DOE   Rhythm:Regular Rate:Normal     Neuro/Psych PSYCHIATRIC DISORDERS negative neurological ROS     GI/Hepatic negative GI ROS, Neg liver ROS,   Endo/Other  diabetes, Type 2, Oral Hypoglycemic Agents  Renal/GU Renal disease  negative genitourinary   Musculoskeletal  (+) Arthritis ,   Abdominal   Peds negative pediatric ROS (+)  Hematology negative hematology ROS (+)   Anesthesia Other Findings   Reproductive/Obstetrics negative OB ROS                            Lab Results  Component Value Date   WBC 9.2 03/16/2016   HGB 12.5* 03/16/2016   HCT 41.1 03/16/2016   MCV 88.2 03/16/2016   PLT 214 03/16/2016   Lab Results  Component Value Date   CREATININE 1.04 03/16/2016   BUN 17 03/16/2016   NA 137 03/16/2016   K 4.9 03/16/2016   CL 98* 03/16/2016   CO2 31 03/16/2016   Lab Results  Component Value Date   INR 1.0 10/20/2013   INR 1.0 12/07/2008   02/2016 EKG: normal sinus rhythm, RBBB.  03/2016 Echo - Left ventricle: The cavity size was normal. Wall thickness was normal. Systolic function was normal. The estimated ejection fraction was in the range of 55% to 60%. Although no diagnostic regional wall motion abnormality was identified, this possibility cannot be completely excluded on the basis of this study.  Doppler parameters are consistent with abnormal left ventricular relaxation (grade 1 diastolic dysfunction). - Mitral valve: Calcified annulus. - Left atrium: The atrium was mildly dilated.    Anesthesia Physical Anesthesia Plan  ASA: III  Anesthesia Plan: MAC   Post-op Pain Management:    Induction: Intravenous  Airway Management Planned: Natural Airway and Nasal Cannula  Additional Equipment:   Intra-op Plan:   Post-operative Plan:   Informed Consent: I have reviewed the patients History and Physical, chart, labs and discussed the procedure including the risks, benefits and alternatives for the proposed anesthesia with the patient or authorized representative who has indicated his/her understanding and acceptance.   Dental advisory given  Plan Discussed with: CRNA  Anesthesia Plan Comments:         Anesthesia Quick Evaluation

## 2016-05-07 ENCOUNTER — Other Ambulatory Visit (INDEPENDENT_AMBULATORY_CARE_PROVIDER_SITE_OTHER): Payer: Medicare Other

## 2016-05-07 DIAGNOSIS — J449 Chronic obstructive pulmonary disease, unspecified: Secondary | ICD-10-CM | POA: Diagnosis not present

## 2016-05-07 DIAGNOSIS — K229 Disease of esophagus, unspecified: Secondary | ICD-10-CM

## 2016-05-07 DIAGNOSIS — K228 Other specified diseases of esophagus: Secondary | ICD-10-CM

## 2016-05-07 DIAGNOSIS — K2289 Other specified disease of esophagus: Secondary | ICD-10-CM

## 2016-05-07 LAB — BASIC METABOLIC PANEL
BUN: 23 mg/dL (ref 6–23)
CALCIUM: 9 mg/dL (ref 8.4–10.5)
CO2: 31 meq/L (ref 19–32)
CREATININE: 1.3 mg/dL (ref 0.40–1.50)
Chloride: 99 mEq/L (ref 96–112)
GFR: 57.86 mL/min — AB (ref >60.00)
GLUCOSE: 129 mg/dL — AB (ref 70–99)
Potassium: 4.6 mEq/L (ref 3.5–5.1)
Sodium: 136 mEq/L (ref 135–145)

## 2016-05-08 ENCOUNTER — Encounter (HOSPITAL_COMMUNITY): Payer: Self-pay | Admitting: Family Medicine

## 2016-05-09 ENCOUNTER — Telehealth: Payer: Self-pay | Admitting: *Deleted

## 2016-05-09 ENCOUNTER — Ambulatory Visit (INDEPENDENT_AMBULATORY_CARE_PROVIDER_SITE_OTHER)
Admission: RE | Admit: 2016-05-09 | Discharge: 2016-05-09 | Disposition: A | Discharge status: Home / Self Care | Payer: Medicare Other | Source: Ambulatory Visit | Attending: Internal Medicine | Admitting: Internal Medicine

## 2016-05-09 ENCOUNTER — Telehealth: Payer: Self-pay | Admitting: Internal Medicine

## 2016-05-09 ENCOUNTER — Encounter: Payer: Self-pay | Admitting: Family Medicine

## 2016-05-09 ENCOUNTER — Other Ambulatory Visit: Payer: Self-pay

## 2016-05-09 ENCOUNTER — Telehealth: Payer: Self-pay | Admitting: Family Medicine

## 2016-05-09 DIAGNOSIS — K229 Disease of esophagus, unspecified: Secondary | ICD-10-CM

## 2016-05-09 DIAGNOSIS — K228 Other specified diseases of esophagus: Secondary | ICD-10-CM

## 2016-05-09 DIAGNOSIS — J479 Bronchiectasis, uncomplicated: Secondary | ICD-10-CM | POA: Diagnosis not present

## 2016-05-09 DIAGNOSIS — C159 Malignant neoplasm of esophagus, unspecified: Secondary | ICD-10-CM

## 2016-05-09 DIAGNOSIS — K2289 Other specified disease of esophagus: Secondary | ICD-10-CM

## 2016-05-09 DIAGNOSIS — K8020 Calculus of gallbladder without cholecystitis without obstruction: Secondary | ICD-10-CM | POA: Diagnosis not present

## 2016-05-09 MED ORDER — IOPAMIDOL (ISOVUE-300) INJECTION 61%
100.0000 mL | Freq: Once | INTRAVENOUS | Status: AC | PRN
  Administered 2016-05-09: 100 mL via INTRAVENOUS

## 2016-05-09 NOTE — Telephone Encounter (Signed)
FYI

## 2016-05-09 NOTE — Telephone Encounter (Signed)
See result note.  

## 2016-05-09 NOTE — Telephone Encounter (Signed)
Received referral from Dr. Hilarie Fredrickson to have patient seen for his new diagnosis of esophageal cancer. Patient has requested Dr. Julien Nordmann. Called patient and he informed nurse that he requested Dr. Julien Nordmann because he has cared for his wife, [name] (06/26/16) and she has been cancer free for 5 years. He has faith in Dr. Julien Nordmann and confidence in his care. Made him aware the request will be put on his desk and he will be called after MD reviews the records.

## 2016-05-09 NOTE — Telephone Encounter (Signed)
Noted. Chart updated.

## 2016-05-09 NOTE — Telephone Encounter (Signed)
Accidentally closed the encounter.

## 2016-05-09 NOTE — Telephone Encounter (Signed)
Pt calling for CT results. Please advise. 

## 2016-05-09 NOTE — Telephone Encounter (Signed)
Patient called to let Dr. Anitra Lauth know that he has cancer. He doesn't know much other than he thinks it's in his throat. He is waiting for his results from his scan today.

## 2016-05-10 DIAGNOSIS — H40013 Open angle with borderline findings, low risk, bilateral: Secondary | ICD-10-CM | POA: Diagnosis not present

## 2016-05-10 DIAGNOSIS — H5340 Unspecified visual field defects: Secondary | ICD-10-CM

## 2016-05-10 DIAGNOSIS — E119 Type 2 diabetes mellitus without complications: Secondary | ICD-10-CM | POA: Diagnosis not present

## 2016-05-10 DIAGNOSIS — H40003 Preglaucoma, unspecified, bilateral: Secondary | ICD-10-CM | POA: Diagnosis not present

## 2016-05-10 DIAGNOSIS — S04049A Injury of visual cortex, unspecified eye, initial encounter: Secondary | ICD-10-CM | POA: Diagnosis not present

## 2016-05-10 HISTORY — DX: Unspecified visual field defects: H53.40

## 2016-05-10 LAB — HM DIABETES EYE EXAM

## 2016-05-14 ENCOUNTER — Telehealth: Payer: Self-pay | Admitting: *Deleted

## 2016-05-14 ENCOUNTER — Encounter: Payer: Self-pay | Admitting: Family Medicine

## 2016-05-14 NOTE — Telephone Encounter (Signed)
Oncology Nurse Navigator Documentation  Oncology Nurse Navigator Flowsheets 05/14/2016  Navigator Location [facility]  Navigator Encounter Type Introductory phone call  Abnormal Finding Date 04/16/2016  Confirmed Diagnosis Date 05/04/2016  Spoke with patient and provided new patient appointment for 05/18/16 at 0830 in [facility] [place] with Dr. Lisbeth Renshaw and Dr. Burr Medico. Explained the [place] process. Dr. Worthy Flank schedule was not able to accommodate his urgent need to be seen and he understands and agrees to see Dr. Burr Medico.  Informed of location of [place], valet service, and registration process. Reminded to bring insurance cards and a current medication list, including supplements. Patient verbalizes understanding. Will have case presented in [facility] [place] on 05/16/16.

## 2016-05-18 ENCOUNTER — Encounter: Payer: Self-pay | Admitting: *Deleted

## 2016-05-18 ENCOUNTER — Ambulatory Visit: Payer: Medicare Other | Attending: Hematology | Admitting: Physical Therapy

## 2016-05-18 ENCOUNTER — Ambulatory Visit (HOSPITAL_BASED_OUTPATIENT_CLINIC_OR_DEPARTMENT_OTHER): Payer: Medicare Other | Admitting: Hematology

## 2016-05-18 ENCOUNTER — Ambulatory Visit: Payer: Medicare Other | Admitting: Nutrition

## 2016-05-18 ENCOUNTER — Encounter: Payer: Self-pay | Admitting: Hematology

## 2016-05-18 ENCOUNTER — Ambulatory Visit: Payer: Medicare Other | Admitting: Hematology

## 2016-05-18 ENCOUNTER — Ambulatory Visit
Admission: RE | Admit: 2016-05-18 | Discharge: 2016-05-18 | Disposition: A | Discharge status: Home / Self Care | Payer: Medicare Other | Source: Ambulatory Visit | Attending: Radiation Oncology | Admitting: Radiation Oncology

## 2016-05-18 ENCOUNTER — Ambulatory Visit: Payer: Medicare Other | Admitting: Radiation Oncology

## 2016-05-18 ENCOUNTER — Encounter: Payer: Self-pay | Admitting: Physical Therapy

## 2016-05-18 ENCOUNTER — Telehealth: Payer: Self-pay | Admitting: Hematology

## 2016-05-18 VITALS — BP 105/47 | HR 89 | Temp 98.5°F | Resp 18 | Ht 68.0 in | Wt 309.5 lb

## 2016-05-18 DIAGNOSIS — I10 Essential (primary) hypertension: Secondary | ICD-10-CM | POA: Diagnosis not present

## 2016-05-18 DIAGNOSIS — E119 Type 2 diabetes mellitus without complications: Secondary | ICD-10-CM

## 2016-05-18 DIAGNOSIS — Z510 Encounter for antineoplastic radiation therapy: Secondary | ICD-10-CM | POA: Insufficient documentation

## 2016-05-18 DIAGNOSIS — C160 Malignant neoplasm of cardia: Secondary | ICD-10-CM

## 2016-05-18 DIAGNOSIS — M6281 Muscle weakness (generalized): Secondary | ICD-10-CM

## 2016-05-18 DIAGNOSIS — I5032 Chronic diastolic (congestive) heart failure: Secondary | ICD-10-CM

## 2016-05-18 NOTE — Patient Instructions (Signed)
  Ways to get started on an exercise program 1.  Start for 10 minutes per day with a walking program. 2. Work towards 30 minutes of exercise per day 3. When you do an aerobic exercise program start on a low level 4. Water aerobics is a good place due to decreased strain on your joints 5. Begin your exercise program gradually and progress slowly over time 6. When exercising use correct form. a. Keep neutral spine b. Engage abdominals c. Keep chest up  d. Chin down e. Do not lock your knees  Earlie Counts, PT [facility], [place], [place] [address] [phone]   Tips for Energy Conservation for Activities of Daily Living . Plan ahead to avoid rushing. . Sit down to bathe and dry off. Wear a terry robe instead of drying off. . Use a shower/bath organizer to decrease leaning and reaching. . Use extension handles on sponges and brushes. Susa Simmonds grab rails in the bathroom or use an elevated toilet seat. Hoyle Barr out clothes and toiletries before dressing. . Minimize leaning over to put on clothes and shoes. Bring your foot to your knee to apply socks and shoes. . Wear comfortable shoes and low-heeled, slip on shoes. Wear button front shirts rather than pullovers. Housekeeping . Schedule household tasks throughout the week. . Do housework sitting down when possible. . Delegate heavy housework, shopping, laundry and child care when possible. . Drag or slide objects rather than lifting. . Sit when ironing and take rest periods. . Stop working before becoming overly tired. Shopping . Organize list by aisle. . Use a grocery cart for support. Marland Kitchen Shop at less busy times. . Ask for help with getting to the car. Meal Preparation . Use convenience and easy-to-prepare foods. . Use small appliances that take less effort to use. Marland Kitchen Prepare meals sitting down. . Soak dishes instead of scrubbing and let dishes air dry. . Prepare double portions  and freeze half. Child Care . Plan activities that can be done sitting down, such as drawing pictures, playing games, reading, and computer games. . Encourage children to climb up onto your lap or into the highchair instead of being lifted. . Make a game of the household chores so that children will want to help. . Delegate child care when possible.  Earlie Counts, PT, [facility]; [place], [place], [place] [address]  Tandem Stance    Can hold onto counter if not feeling steady. Right foot in front of left, heel touching toe both feet "straight ahead". Stand on Foot Triangle of Support with both feet. Balance in this position _30__ seconds. Do with left foot in front of right. 3 times each  Copyright  VHI. All rights reserved.    Stance: single leg on floor. Raise leg. Hold _15__ seconds. Repeat with other leg. __3_ reps per set, _1__ sets per day .  Can hold onto counter to keep steady  Copyright  VHI. All rights reserved.

## 2016-05-18 NOTE — Progress Notes (Signed)
Patient was seen in GI clinic.  70 year old male diagnosed with esophageal cancer.  He is a patient of Dr. Burr Medico.  Past medical history includes diabetes type 2, hyperlipidemia, hypertension, obesity, CAD, MI, COPD on home oxygen and CRI.  Medications include Lipitor, Celexa, Klonopin, Zetia, Glucophage, omega-3 fatty acids, Protonix, and Carafate.  Labs include glucose 129 on July 10.  Height: 5 feet 8 inches. Weight: 309.5 pounds July 21. Usual body weight: 330 pounds. BMI: 47.07.  Patient reports one month history of dysphagia. He is consuming soft foods without difficulty. Patient enjoys most foods. He has tried premier protein drink and enjoys this. Reports fasting blood sugars approximately 110. Treatment Plan is for 3 weeks radiation therapy and then possibly chemotherapy.  Nutrition diagnosis: Food and nutrition related knowledge deficit related to esophageal cancer as evidenced by no prior need for nutrition related information.  Intervention:  Patient was educated to consume soft moist foods high in protein in frequent small meals daily to promote weight maintenance. Reviewed sources of protein in patient's diet and provided fact sheets. Encouraged patient continue protein supplements 1-2 daily. Suggested patient could also try Glucerna or boost glucose control.   Questions were answered.  Teach back method was used.  Contact information was given.  Monitoring, evaluation, goals: Patient will tolerate adequate calories and protein to minimize weight loss.  Next visit: To be scheduled as needed.  **Disclaimer: This note was dictated with voice recognition software. Similar sounding words can inadvertently be transcribed and this note may contain transcription errors which may not have been corrected upon publication of note.**

## 2016-05-18 NOTE — Progress Notes (Signed)
[place] Work  Clinical Social Work met with pt, his wife and daughter at GI Clinic to introduce self, explain role of CSW/Support Team and review the distress screening protocol.  The patient scored a 5 on the Psychosocial Distress Thermometer which indicates moderate distress. Clinical Social Worker met with family to assess for distress and other psychosocial needs. Pt reports to have good supports from wife and extended family. Daughter, Britt Boozer helps with transportation, shopping and meal preparation. Pt relies on a motorized wheel chair to help get him around. He is concerned about insurance as he is not sure he can afford to pay for treatment. Pt appears to have met his out of pocket max, but CSW to make referral to fin advocate for further assistance. He had many questions as to how his insurance would be billed for radiation. CSW will see if someone can attend to these concerns. Pt had all of his food issues addressed by the dietician and his information and physical concerns addressed by MD. CSW reviewed common emotions, coping techniques and resources for support. Family and pt aware to reach as needed and were provided with handouts and contact information.   ONCBCN DISTRESS SCREENING 05/18/2016  Screening Type Initial Screening  Distress experienced in past week (1-10) 5  Practical problem type Insurance;Food  Emotional problem type Adjusting to illness  Spiritual/Religous concerns type Facing my mortality  Information Concerns Type Lack of info about diagnosis;Lack of info about treatment;Lack of info about complementary therapy choices  Physical Problem type Pain;Getting around;Bathing/dressing;Breathing;Mouth sores/swallowing;Constipation/diarrhea;Tingling hands/feet  Referral to financial advocate Yes  Referral to support programs Yes    Clinical Social Worker follow up needed: No.  If yes, follow up plan: Loren Racer, [place]  [facility] Phone: [phone] Fax: [phone]

## 2016-05-18 NOTE — Telephone Encounter (Signed)
per pof to sch pt appt-gave pt copy of avs °

## 2016-05-18 NOTE — Addendum Note (Signed)
Encounter addended by: Kyung Rudd, MD on: 05/18/2016 11:02 AM<BR>     Documentation filed: Fast Note, Problem List

## 2016-05-18 NOTE — Patient Instructions (Signed)
Care Plan Summary- 05/18/2016 Name:  [name]       DOB:  04/30/1946 Your Medical Team: Medical Oncologist:  Dr. Truitt Merle Radiation Oncologist:  Dr. Kyung Rudd Surgeon:    Type of Cancer: Esophageal Cancer  Stage/Grade: Stage IV *Exact staging of your cancer is based on size of the tumor, depth of invasion, involvement of lymph nodes or not, and whether or not the cancer has spread beyond the primary site   Recommendations: Based on information available as of today's consult. Recommendations may change depending on the results of further tests or exams. 1) PET scan to complete staging 2) Radiation therapy for weeks, then re-evaluate for chemo (weekly Taxol/Carboplatin) 3)  Next Steps:       1)  PET scan 05/21/16 at 1030/1100 *see instructions* 2) SIM appointment with radiation oncology on 05/28/16 (? Start tx 06/04/16) 3) Return to Dr. Burr Medico with labs in 3 weeks 4) Call for increased pain/difficulty swallowing  ______________________________________________________________________________   Questions? Merceda Elks, RN, BSN at [phone]. Manuela Schwartz is your Oncology Nurse Navigator and is available to assist you while you're receiving your medical care at [facility].

## 2016-05-18 NOTE — Progress Notes (Signed)
Radiation Oncology         ([phone]) [phone] ________________________________  Name: [name] MRN: [id number]  Date: 05/18/2016  DOB: 26-Dec-1945  HM:CNOBSJG,GEZMOQ H, MD  McGowen, Adrian Blackwater, MD     REFERRING PHYSICIAN: Tammi Sou, MD   DIAGNOSIS: The encounter diagnosis was Cancer of cardio-esophageal junction ([place]).   HISTORY OF PRESENT ILLNESS: [name] is a 70 y.o. male seen at the request of Dr. 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 He was admitted on 03/15/2016 due to cardiac chest pain during his workup, was complaining of dysphasia. He was followed and observation, and did not have any acute coronary abnormalities. He was referred to gastroenterology and subsequently a barium esophagram was performed revealing possible GE junction stricture. He underwent endoscopy on 05/04/2016 revealing a 5 cm mass partially circumferential along the GE junction and the gastric cardia though nonobstructing. No active bleeding was noted, and multiple non-bleeding erosions are seen at the gastric antrum final pathology revealed a poorly differentiated carcinoma arising in squamous tissue. A CT scan of the  abdomen and pelvis on 05/09/2016 revealed a thickened wall of the gastroesophageal junction with scout R lymph nodes measuring 1.7 cm of the distal esophagus and an abnormal peripancreatic node as well as retroperitoneal nodes. His case was presented at multidisciplinary clinic, and discussion was made to proceed with PET scan to better understand the activity within retroperitoneal and peripancreatic lymph nodes. There is also discussed whether or not biopsying the periaortic lymph nodes would be possible, and the final recommendation was if the PET was positive, reacting to this rather than proceeding with additional biopsy. He comes to discuss the role for  radiotherapy in this setting of  receiving palliative chemotherapy with Dr. Burr Medico.    PREVIOUS RADIATION THERAPY: No   PAST MEDICAL HISTORY:  Past Medical History  Diagnosis Date  . DIABETES MELLITUS, TYPE II 06/24/2007    No diab retpthy as of 08/05/15 eye exam.  . HYPERLIPIDEMIA 06/24/2007  . HYPERTENSION 05/01/2007  . Hypoxemia 01/27/2010  . OBESITY 09/02/2008  . OSTEOARTHRITIS 05/01/2007  . Chronic diastolic CHF (congestive heart failure) ([place]) 02/25/2009  . Asthma     as a child  . Cataract     multiple types, bilateral  . History of kidney stones   . Pruritic condition 05/2014    Allergist summer 2015, no new testing.  . Dermatitis 05/2014  . Coronary artery disease     chronically occluded LAD and diagonal with right to left collaterals, mild disease in the left circ and moderate disease in the mid RCA on medical management with Imdur, ASA, and Plavix.  . Myocardial infarction [facility])     pt states he was informed per MD that he has had one but pt was unaware   . COPD (chronic obstructive pulmonary disease) ([place])   . Obesity hypoventilation syndrome (H[facility])     oxygen 24/7 (2 liters [place] as of 02/2016)  . 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 OSA (obstructive sleep apnea)     not tested; pt scored 4 per stop bang tool results sent to PCP   . Essential hypertension 05/01/2007    Qualifier: Diagnosis of  By: Tiney Rouge CMA, Ellison Hughs     . Presbycusis of both ears 05/2015    [facility]  . Chronic  renal insufficiency, stage III (moderate) 2017    Stage II/III (GFR around 60)  . Hyperkalemia 12/2015    Decreased ACE-I by 50% in response, then potassium normalized.  . On home oxygen therapy     Oxygen @ 2l/m nasally 24/7 hours  . Esophageal cancer ([place]) 04/2016    poorly differentiated carcinoma (Dr. Pyrtle--EGD).  CT C/A/P showed metastatic adenopathy in mediastinum and upper abdomen 05/09/16.  [name] field defect 05/10/16    Per Dr. Melina Fiddler, O.D.: Rt, loss of  inf/temp quad and some loss sup/temp.  ?CVA  ? Pituitary tumor? ?Brain met.       PAST SURGICAL HISTORY: Past Surgical History  Procedure Laterality Date  . Appendectomy    . Wrist surgery Right     right fx  . Shoulder surgery Right     right fx  . Lumbar disc surgery  2001  . Carpal tunnel release Left   . Cataract extraction w/phaco Right 04/29/2013    Procedure: CATARACT EXTRACTION PHACO AND INTRAOCULAR LENS PLACEMENT (IOC);  Surgeon: Adonis Brook, MD;  Location: [facility];  Service: Ophthalmology;  Laterality: Right;  . Colonoscopy w/ polypectomy  08/2011    Many polyps--all hyperplastic, severe diverticulosis, int hem.  [employer] hemoccult testing via [facility] 06/22/15 NEG  . Left heart catheterization with coronary angiogram N/A 10/26/2013    Procedure: LEFT HEART CATHETERIZATION WITH CORONARY ANGIOGRAM;  Surgeon: Jettie Booze, MD;  Location: [facility] CATH LAB;  Service: Cardiovascular;  Laterality: N/A;  . Tonsillectomy    . Cardiac catheterization    . Knee arthroscopy with medial menisectomy Right 12/16/2014    Procedure: RIGHT KNEE ARTHROSCOPY WITH MEDIAL MENISECTOMY microfracture medial femoral condyle abrasion condroplasty medial femoral condyle lateral menisectomy;  Surgeon: Tobi Bastos, MD;  Location: [facility];  Service: Orthopedics;  Laterality: Right;  . Transthoracic echocardiogram  03/2016    EF 55-60%, grade I DD, LAE  . Cataract extraction, bilateral    . Esophagogastroduodenoscopy N/A 05/04/2016    Procedure: ESOPHAGOGASTRODUODENOSCOPY (EGD);  Surgeon: Jerene Bears, MD;  Location: [facility];  Service: Gastroenterology;  Laterality: N/A;  . Balloon dilation N/A 05/04/2016    Procedure: BALLOON DILATION;  Surgeon: Jerene Bears, MD;  Location: [facility];  Service: Gastroenterology;  Laterality: N/A;     FAMILY HISTORY:  Family History  Problem Relation Age of Onset  . Aneurysm Father   . Alcohol abuse Father   . Colon cancer Neg Hx   . Heart attack Mother   .  Diabetes Maternal Aunt      SOCIAL HISTORY:  reports that he quit smoking about 32 years ago. His smoking use included Cigarettes, Pipe, and Cigars. He has a 45 pack-year smoking history. His smokeless tobacco use includes Chew. He reports that he does not drink alcohol or use illicit drugs.   ALLERGIES: Review of patient's allergies indicates no known allergies.   MEDICATIONS:  Current Outpatient Prescriptions  Medication Sig Dispense Refill  . aspirin 81 MG tablet Take 81 mg by mouth daily.      Marland Kitchen atorvastatin (LIPITOR) 80 MG tablet Take 1 tablet (80 mg total) by mouth daily. 90 tablet 3  . beta carotene w/minerals (OCUVITE) tablet Take 1 tablet by mouth daily.    . cetirizine (ZYRTEC) 10 MG tablet Take 10 mg by mouth daily.    . citalopram (CELEXA) 20 MG tablet Take 1 tablet (20 mg total) by mouth daily. 90 tablet 3  . clonazePAM (KLONOPIN) 1 MG tablet  Take 1 tablet (1 mg total) by mouth 2 (two) times daily as needed for anxiety. 60 tablet 5  . cromolyn (OPTICROM) 4 % ophthalmic solution Place 1 drop into both eyes 4 (four) times daily.    Marland Kitchen diltiazem (CARDIZEM CD) 120 MG 24 hr capsule TAKE ONE CAPSULE BY MOUTH ONCE DAILY 30 capsule 5  . ezetimibe (ZETIA) 10 MG tablet Take 1 tablet (10 mg total) by mouth daily. 30 tablet 11  . gabapentin (NEURONTIN) 300 MG capsule TAKE ONE CAPSULE BY MOUTH THREE TIMES DAILY 270 capsule 3  . glucose blood (ONE TOUCH ULTRA TEST) test strip TEST 3 TIMES A DAY AS DIRECTED 100 each 11  . HUMALOG KWIKPEN 200 UNIT/ML SOPN INJECT 24 UNITS SUBCUTANEOUSLY THREE TIMES DAILY BEFORE MEALS 15 mL 6  . HUMULIN N KWIKPEN 100 UNIT/ML Kiwkpen INJECT 120 UNITS SUBCUTANEOUSLY WITH BREAKFAST AND 50 UNITS SUBCUTANEOUSLY WITH SUPPER 60 mL 6  . hydrOXYzine (ATARAX/VISTARIL) 25 MG tablet 1 tab at supper and 1 at bedtime (for insomnia) 60 tablet 11  . isosorbide mononitrate (IMDUR) 60 MG 24 hr tablet Take 1 tablet (60 mg total) by mouth at bedtime. 30 tablet 11  . lisinopril  (PRINIVIL,ZESTRIL) 40 MG tablet Take 20 mg by mouth daily.    . metFORMIN (GLUCOPHAGE) 1000 MG tablet TAKE ONE TABLET BY MOUTH TWICE DAILY WITH MEALS 60 tablet 6  . Omega-3 Fatty Acids ([employer] FISH OIL) 1000 MG CAPS Take 2 tablets by mouth 2 (two) times daily. 90 capsule   . ONETOUCH DELICA LANCETS 81K MISC 1 each by Other route 3 (three) times daily. USE TO TEST 3 TIMES A DAY 100 each 11  . OXYGEN Inhale 2 L/min into the lungs continuous.    . pantoprazole (PROTONIX) 40 MG tablet Take 1 tablet (40 mg total) by mouth 2 (two) times daily. 60 tablet 3  . sucralfate (CARAFATE) 1 GM/10ML suspension Take 10 mLs (1 g total) by mouth 4 (four) times daily. 420 mL 1  . torsemide (DEMADEX) 20 MG tablet Take 1 tablet (20 mg total) by mouth daily. Take extra tab as directed for weight gain. 45 tablet 11   No current facility-administered medications for this encounter.     REVIEW OF SYSTEMS: On review of systems, the patient reports that he is doing well overall. He denies any chest pain, shortness of breath, cough, fevers, chills, night sweats, unintended weight changes. He denies any bowel or bladder disturbances, and denies abdominal pain, nausea or vomiting. He denies any new musculoskeletal or joint aches or pains. A complete review of systems is obtained and is otherwise negative.     PHYSICAL EXAM:   Pain scale 0/10 In general this is an obese caucasian male in no acute distress. He is alert and oriented x4 and appropriate throughout the examination. HEENT reveals that the patient is normocephalic, atraumatic. EOMs are intact. PERRLA. Skin is intact without any evidence of gross lesions. Cardiovascular exam reveals a regular rate and rhythm, no clicks rubs or murmurs are auscultated. Chest is clear to auscultation bilaterally. Lymphatic assessment is performed and does not reveal any adenopathy in the cervical, supraclavicular, axillary, or inguinal chains. Abdomen has active bowel sounds in all  quadrants and is intact. The abdomen is soft, non tender, non distended. Lower extremities are negative for pretibial pitting edema, deep calf tenderness, cyanosis or clubbing.   ECOG = 1  0 - Asymptomatic (Fully active, able to carry on all predisease activities without restriction)  1 - Symptomatic  but completely ambulatory (Restricted in physically strenuous activity but ambulatory and able to carry out work of a light or sedentary nature. For example, light housework, office work)  2 - Symptomatic, <50% in bed during the day (Ambulatory and capable of all self care but unable to carry out any work activities. Up and about more than 50% of waking hours)  3 - Symptomatic, >50% in bed, but not bedbound (Capable of only limited self-care, confined to bed or chair 50% or more of waking hours)  4 - Bedbound (Completely disabled. Cannot carry on any self-care. Totally confined to bed or chair)  5 - Death   Eustace Pen MM, Creech RH, Tormey DC, et al. [phone]). "Toxicity and response criteria of the [facility]". [place] Oncol. 5 (6): 649-55    LABORATORY DATA:  Lab Results  Component Value Date   WBC 9.2 03/16/2016   HGB 12.5* 03/16/2016   HCT 41.1 03/16/2016   MCV 88.2 03/16/2016   PLT 214 03/16/2016   Lab Results  Component Value Date   NA 136 05/07/2016   K 4.6 05/07/2016   CL 99 05/07/2016   CO2 31 05/07/2016   Lab Results  Component Value Date   ALT 17 03/12/2016   AST 17 03/12/2016   ALKPHOS 46 03/12/2016   BILITOT 0.5 03/12/2016      RADIOGRAPHY: Ct Chest W Contrast  05/09/2016  CLINICAL DATA:  Esophageal mass EXAM: CT CHEST, ABDOMEN, AND PELVIS WITH CONTRAST TECHNIQUE: Multidetector CT imaging of the chest, abdomen and pelvis was performed following the standard protocol during bolus administration of intravenous contrast. CONTRAST:  125m ISOVUE-300 IOPAMIDOL (ISOVUE-300) INJECTION 61% COMPARISON:  None. FINDINGS: CT CHEST FINDINGS  Mediastinum/Lymph Nodes: The heart size is normal. No pericardial effusion. There is aortic atherosclerosis. Calcification involving the RCA, LAD and left circumflex coronary artery is identified. The trachea appears patent and is midline. In the posterior mediastinum there is an enlarged lymph node adjacent to the esophagus and aorta which measures 1.7 cm, image 37 of series 2. Borderline enlargement of the sub- carinal lymph node has a short axis of 1 cm, image 28 of series 2. There is a proximal right paratracheal lymph node which measures 9 mm, image 9 of series 2. No supraclavicular or axillary adenopathy. Lungs/Pleura: There is no pleural fluid identified. There is peripheral and basilar predominant interstitial reticulation identified. Bronchiectasis noted within the posterior medial left lower lobe. No frank honeycombing. No airspace consolidation or atelectasis identified. No suspicious pulmonary nodule or mass. Musculoskeletal: Multilevel degenerative disc disease is present throughout the thoracic spine. No aggressive lytic or sclerotic bone lesions identified. Advanced arthropathic changes are noted involving the right glenohumeral joint. CT ABDOMEN PELVIS FINDINGS Hepatobiliary: There is hypertrophy of the lateral segment of left lobe and caudate lobe of liver. The liver has a diffusely irregular contour. No focal liver abnormalities identified. Stones are noted within the dependent portion of the gallbladder which measure up to 4 mm. No gallbladder wall thickening or biliary dilatation noted. Pancreas: No mass, inflammatory changes, or other significant abnormality. Spleen: Within normal limits in size and appearance. Adrenals/Urinary Tract: The adrenal glands are normal. Right kidney cysts are identified. Scratch set bilateral renal cysts noted. Cortical calcification involves the anterior medial aspect of the midpole of right kidney. No obstructive uropathy identified. The urinary bladder appears  normal. Stomach/Bowel: Mild thickening of the distal esophagus and proximal stomach around the level of the GE junction identified. There is no pathologic dilatation  of the small or large bowel loops identified. Numerous distal colonic diverticula identified. No acute inflammation. Vascular/Lymphatic: Calcified atherosclerotic disease involves the abdominal aorta. No aneurysm. Index gastrohepatic ligament lymph node measures 1.9 cm, image 53 of series 2. There is a celiac axis lymph node which has a short axis measuring 1.4 cm, image 56 of series 2. Periaortic lymph node is enlarged measuring 1.7 cm, image 69 of series 2. There is an aortocaval node which measures 1.2 cm, image 65 of series 2. No pelvic or inguinal adenopathy. Reproductive: No mass or other significant abnormality. Other: There is no ascites or focal fluid collections within the abdomen or pelvis. No peritoneal nodularity. Musculoskeletal: Degenerative disc disease noted within the lumbar spine. No suspicious bone lesions. IMPRESSION: 1. Mild wall thickening involving the distal esophagus and proximal stomach identified compatible with biopsy-proven esophageal carcinoma. 2. Enlarged mediastinal and upper abdominal lymph nodes are identified compatible with metastatic adenopathy. 3. Morphologic features of the liver which are concerning for cirrhosis. No focal liver lesion identified. 4. Aortic atherosclerosis and multi vessel coronary artery calcification. 5. Interstitial changes within the lungs including subpleural reticulation and mild bronchiectasis. The findings are nonspecific but may be seen with early usual interstitial pneumonitis (UIP) or nonspecific interstitial pneumonitis (NSIP). 6. Gallstones. Electronically Signed   By: Kerby Moors M.D.   On: 05/09/2016 13:47   Ct Abdomen Pelvis W Contrast  05/09/2016  CLINICAL DATA:  Esophageal mass EXAM: CT CHEST, ABDOMEN, AND PELVIS WITH CONTRAST TECHNIQUE: Multidetector CT imaging of the  chest, abdomen and pelvis was performed following the standard protocol during bolus administration of intravenous contrast. CONTRAST:  159m ISOVUE-300 IOPAMIDOL (ISOVUE-300) INJECTION 61% COMPARISON:  None. FINDINGS: CT CHEST FINDINGS Mediastinum/Lymph Nodes: The heart size is normal. No pericardial effusion. There is aortic atherosclerosis. Calcification involving the RCA, LAD and left circumflex coronary artery is identified. The trachea appears patent and is midline. In the posterior mediastinum there is an enlarged lymph node adjacent to the esophagus and aorta which measures 1.7 cm, image 37 of series 2. Borderline enlargement of the sub- carinal lymph node has a short axis of 1 cm, image 28 of series 2. There is a proximal right paratracheal lymph node which measures 9 mm, image 9 of series 2. No supraclavicular or axillary adenopathy. Lungs/Pleura: There is no pleural fluid identified. There is peripheral and basilar predominant interstitial reticulation identified. Bronchiectasis noted within the posterior medial left lower lobe. No frank honeycombing. No airspace consolidation or atelectasis identified. No suspicious pulmonary nodule or mass. Musculoskeletal: Multilevel degenerative disc disease is present throughout the thoracic spine. No aggressive lytic or sclerotic bone lesions identified. Advanced arthropathic changes are noted involving the right glenohumeral joint. CT ABDOMEN PELVIS FINDINGS Hepatobiliary: There is hypertrophy of the lateral segment of left lobe and caudate lobe of liver. The liver has a diffusely irregular contour. No focal liver abnormalities identified. Stones are noted within the dependent portion of the gallbladder which measure up to 4 mm. No gallbladder wall thickening or biliary dilatation noted. Pancreas: No mass, inflammatory changes, or other significant abnormality. Spleen: Within normal limits in size and appearance. Adrenals/Urinary Tract: The adrenal glands are  normal. Right kidney cysts are identified. Scratch set bilateral renal cysts noted. Cortical calcification involves the anterior medial aspect of the midpole of right kidney. No obstructive uropathy identified. The urinary bladder appears normal. Stomach/Bowel: Mild thickening of the distal esophagus and proximal stomach around the level of the GE junction identified. There is no pathologic dilatation of  the small or large bowel loops identified. Numerous distal colonic diverticula identified. No acute inflammation. Vascular/Lymphatic: Calcified atherosclerotic disease involves the abdominal aorta. No aneurysm. Index gastrohepatic ligament lymph node measures 1.9 cm, image 53 of series 2. There is a celiac axis lymph node which has a short axis measuring 1.4 cm, image 56 of series 2. Periaortic lymph node is enlarged measuring 1.7 cm, image 69 of series 2. There is an aortocaval node which measures 1.2 cm, image 65 of series 2. No pelvic or inguinal adenopathy. Reproductive: No mass or other significant abnormality. Other: There is no ascites or focal fluid collections within the abdomen or pelvis. No peritoneal nodularity. Musculoskeletal: Degenerative disc disease noted within the lumbar spine. No suspicious bone lesions. IMPRESSION: 1. Mild wall thickening involving the distal esophagus and proximal stomach identified compatible with biopsy-proven esophageal carcinoma. 2. Enlarged mediastinal and upper abdominal lymph nodes are identified compatible with metastatic adenopathy. 3. Morphologic features of the liver which are concerning for cirrhosis. No focal liver lesion identified. 4. Aortic atherosclerosis and multi vessel coronary artery calcification. 5. Interstitial changes within the lungs including subpleural reticulation and mild bronchiectasis. The findings are nonspecific but may be seen with early usual interstitial pneumonitis (UIP) or nonspecific interstitial pneumonitis (NSIP). 6. Gallstones.  Electronically Signed   By: Kerby Moors M.D.   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   PLAN: Dr. Lisbeth Renshaw and I have discussed the work up and findings to date. Dr. Lisbeth Renshaw reviews the recommendations made at GI conference this week. Given the patient's medical comorbidities, we do not feel that he would be an appropriate surgical candidate. Dr. Burr Medico and Dr. Lisbeth Renshaw recommend proceeding with a PET CT scan to better evaluate the activity within the lymph nodes seen in on his CT imaging. Once this information can be obtained, we would move forward with chemotherapy and radiotherapy with the intent better determined by PET scan, either with curative intent versus palliative therapy. The patient states an understanding and agreement of this. We will meet back to discuss our final planning on 05/28/16 when he comes in for CT simulation in the department. We discussed the course of therapy would either be 3-6 weeks. We reviewed the risks, benefits, short and long term effects of therapy.   He was encouraged to discuss his hypotension with his cardiologist Dr. Radford Pax.   The above documentation reflects my direct findings during this shared patient visit. Please see the separate note by Dr. Lisbeth Renshaw on this date for the remainder of the patient's plan of care.    Carola Rhine, PAC

## 2016-05-18 NOTE — Therapy (Signed)
[facility] [address], [place], [place], [address] Phone: [phone]   Fax:  [phone]  Physical Therapy Evaluation  Patient Details  Name: [name] MRN: [id number] Date of Birth: [birth year] Referring Provider: Dr. Burr Medico  Encounter Date: 05/18/2016      PT End of Session - 05/18/16 1110    Visit Number 1   PT Start Time 1034   PT Stop Time 1059   PT Time Calculation (min) 25 min   Activity Tolerance Patient tolerated treatment well   Behavior During Therapy [facility] for tasks assessed/performed      Past Medical History  Diagnosis Date  . DIABETES MELLITUS, TYPE II 06/24/2007    No diab retpthy as of 08/05/15 eye exam.  . HYPERLIPIDEMIA 06/24/2007  . HYPERTENSION 05/01/2007  . Hypoxemia 01/27/2010  . OBESITY 09/02/2008  . OSTEOARTHRITIS 05/01/2007  . Chronic diastolic CHF (congestive heart failure) ([place]) 02/25/2009  . Asthma     as a child  . Cataract     multiple types, bilateral  . History of kidney stones   . Pruritic condition 05/2014    Allergist summer 2015, no new testing.  . Dermatitis 05/2014  . Coronary artery disease     chronically occluded LAD and diagonal with right to left collaterals, mild disease in the left circ and moderate disease in the mid RCA on medical management with Imdur, ASA, and Plavix.  . Myocardial infarction [facility])     pt states he was informed per MD that he has had one but pt was unaware   . COPD (chronic obstructive pulmonary disease) ([place])   . Obesity hypoventilation syndrome (HCC)     oxygen 24/7 (2 liters [place] as of 02/2016)  . 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 OSA (obstructive sleep apnea)     not tested; pt scored 4 per stop bang tool results sent to PCP   . Essential hypertension 05/01/2007    Qualifier: Diagnosis of  By: Tiney Rouge CMA, Ellison Hughs     . Presbycusis of both ears 05/2015    [facility]  . Chronic  renal insufficiency, stage III (moderate) 2017    Stage II/III (GFR around 60)  . Hyperkalemia 12/2015    Decreased ACE-I by 50% in response, then potassium normalized.  . On home oxygen therapy     Oxygen @ 2l/m nasally 24/7 hours  . 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 Pyrtle--EGD).  CT C/A/P showed metastatic adenopathy in mediastinum and upper abdomen 05/09/16.  [name] field defect 05/10/16    Per Dr. Melina Fiddler, O.D.: Rt, loss of inf/temp quad and some loss sup/temp.  ?CVA  ? Pituitary tumor? ?Brain met.    Past Surgical History  Procedure Laterality Date  . Appendectomy    . Wrist surgery Right     right fx  . Shoulder surgery Right     right fx  . Lumbar disc surgery  2001  . Carpal tunnel release Left   . Cataract extraction w/phaco Right 04/29/2013    Procedure: CATARACT EXTRACTION PHACO AND INTRAOCULAR LENS PLACEMENT (IOC);  Surgeon: Adonis Brook, MD;  Location: [facility];  Service: Ophthalmology;  Laterality: Right;  . Colonoscopy w/ polypectomy  08/2011    Many polyps--all hyperplastic, severe diverticulosis, int hem.  [employer] hemoccult testing via [facility] 06/22/15 NEG  . Left heart catheterization with  coronary angiogram N/A 10/26/2013    Procedure: LEFT HEART CATHETERIZATION WITH CORONARY ANGIOGRAM;  Surgeon: Jettie Booze, MD;  Location: [facility] C[facility] LAB;  Service: Cardiovascular;  Laterality: N/A;  . Tonsillectomy    . Cardiac catheterization    . Knee arthroscopy with medial menisectomy Right 12/16/2014    Procedure: RIGHT KNEE ARTHROSCOPY WITH MEDIAL MENISECTOMY microfracture medial femoral condyle abrasion condroplasty medial femoral condyle lateral menisectomy;  Surgeon: Tobi Bastos, MD;  Location: [facility];  Service: Orthopedics;  Laterality: Right;  . Transthoracic echocardiogram  03/2016    EF 55-60%, grade I DD, LAE  . Cataract extraction, bilateral    . Esophagogastroduodenoscopy N/A 05/04/2016    Procedure:  ESOPHAGOGASTRODUODENOSCOPY (EGD);  Surgeon: Jerene Bears, MD;  Location: [facility];  Service: Gastroenterology;  Laterality: N/A;  . Balloon dilation N/A 05/04/2016    Procedure: BALLOON DILATION;  Surgeon: Jerene Bears, MD;  Location: [facility];  Service: Gastroenterology;  Laterality: N/A;    There were no vitals filed for this visit.       Subjective Assessment - 05/18/16 1100    Subjective Patient is attending GI clinic.   Patient Stated Goals education   Currently in Pain? Yes   Pain Score 9    Pain Location Chest   Pain Orientation Anterior   Pain Descriptors / Indicators Discomfort   Pain Type Chronic pain   Pain Onset More than a month ago   Pain Frequency Intermittent   Aggravating Factors  eating   Pain Relieving Factors not eating   Multiple Pain Sites No            [facility] PT Assessment - 05/18/16 0001    Assessment   Medical Diagnosis Esophageal Cancer   Referring Provider Dr. Burr Medico   Onset Date/Surgical Date 05/04/16   Prior Therapy [facility]   Precautions   Precautions Other (comment);Fall   Precaution Comments cancer precautions; on oxygen   Restrictions   Weight Bearing Restrictions No   Balance Screen   Has the patient fallen in the past 6 months No   Has the patient had a decrease in activity level because of a fear of falling?  No  patient fell out of bed rolling over, has a bedrail now   Is the patient reluctant to leave their home because of a fear of falling?  No   Home Environment   Living Environment Private residence   Available Help at Discharge Family   Prior Function   Level of Independence Independent with household mobility with device   Vocation Retired   Associate Professor   Overall Cognitive Status Within Functional Limits for tasks assessed   Observation/Other Assessments   Observations Patient was sitting in a wheelchair and using oxygen   Focus on Therapeutic Outcomes (FOTO)  75% limitation per therapist observation   Posture/Postural  Control   Posture/Postural Control No significant limitations   ROM / Strength   AROM / PROM / Strength AROM   AROM   Overall AROM Comments right shoulder AROM is limited by 60% from a prior accident   Strength   Overall Strength Comments bil. hip extension 4/5, bil. hip abduction 4-/5,    Ambulation/Gait   Ambulation/Gait Yes   Ambulation/Gait Assistance 6: Modified independent (Device/Increase time)   Ambulation Distance (Feet) 20 Feet   Assistive device Straight cane   Gait Pattern Decreased step length - right;Decreased step length - left;Wide base of support   Ambulation Surface Indoor;Level  PT Education - 05/18/16 1109    Education provided Yes   Education Details tips to conserve energy, walking program, balance exercises   Person(s) Educated Patient   Methods Explanation;Demonstration;Verbal cues;Handout   Comprehension Returned demonstration;Verbalized understanding             PT Long Term Goals - 05/18/16 1117    PT LONG TERM GOAL #1   Title understand the need to exercise and walking program for recovery   Time 1   Period Days   Status Achieved   PT LONG TERM GOAL #2   Title understand how to conserve energy   Time 1   Period Weeks   Status New               Plan - 05/18/16 1110    Clinical Impression Statement Patient is a 70 year old male attending GI conference.  He was diagnosed with esphageal cancer on 05/04/2016 with upper endoscopy.  Patient reports pain in anterior chest when he eats at level 9/10.  Patient has changed his diet to a soft food and soup diet to make it easier to eat.  Patient is deconditioned due to majority of day he sits to watch TV.  Patient will ambulate with a walking when going to church.  When he is in the house he will use a [place] cane and oxygen. Patient has fallen out of bed due to changing the side he is sleeping on.  Patient now has a bedrail on the bed.  Patient is  not open to having home health therapy at this time.  Patient is of high complexity due to all co-mobidities, on oxygen, being deconditioned, and having esphogeal cancer.  Patient benefited from physical therapy to be educated on the need to exercise.    Rehab Potential Excellent   Clinical Impairments Affecting Rehab Potential multiple co-mobidities; obese   PT Frequency 1x / week   PT Duration Other (comment)  1 time visit for GI clinic   PT Next Visit Plan Discharge at this visit   PT Home Exercise Plan current HEP   Recommended Other Services Home health PT    Consulted and Agree with Plan of Care Patient      Patient will benefit from skilled therapeutic intervention in order to improve the following deficits and impairments:  Decreased balance, Decreased activity tolerance, Decreased endurance, Decreased strength  Visit Diagnosis: Muscle weakness (generalized) - Plan: PT plan of care cert/re-cert      G-Codes - 99/35/70 1118    Functional Assessment Tool Used therapist discretion 75% limitation   Functional Limitation Other PT primary   Other PT Primary Current Status (V7793) At least 60 percent but less than 80 percent impaired, limited or restricted   Other PT Primary Goal Status (J0300) At least 60 percent but less than 80 percent impaired, limited or restricted   Other PT Primary Discharge Status (P2330) At least 60 percent but less than 80 percent impaired, limited or restricted       Problem List Patient Active Problem List   Diagnosis Date Noted  . 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 Mass of esophagus determined by endoscopy   . O2 dependent 04/10/2016  . Coronary artery disease with unspecified angina pectoris 04/10/2016  . IDDM (insulin dependent diabetes mellitus) ([place]) 04/10/2016  . Dysphagia 03/22/2016  . PND (paroxysmal nocturnal dyspnea)   . Dyslipidemia associated with type 2 diabetes mellitus  (  [place])   . DM (diabetes mellitus) type II controlled peripheral vascular disorder ([place])   . Spasm of muscle, back 06/20/2015  . Right knee pain 10/26/2014  . Scabies 09/10/2014  . Impaired functional mobility and activity tolerance 08/26/2014  . Recurrent falls 08/26/2014  . Other emphysema ([place]) 08/26/2014  . COPD (chronic obstructive pulmonary disease) ([place])   . Rash and nonspecific skin eruption 06/27/2014  . Pruritic condition 05/18/2014  . Adjustment disorder with anxious mood 04/25/2014  . Itchy skin 04/25/2014  . OSA (obstructive sleep apnea) 02/24/2014  . Coronary atherosclerosis of native coronary artery 11/10/2013  . Abnormal cardiovascular stress test 10/16/2013  . DOE (dyspnea on exertion) 09/14/2013  . HYPOXEMIA 01/27/2010  . Chronic diastolic CHF (congestive heart failure) ([place]) 02/25/2009  . Morbid obesity ([place]) 09/02/2008  . Diabetes mellitus without complication ([place]) 09/13/5207  . Dyslipidemia 06/24/2007  . Essential hypertension 05/01/2007  . OSTEOARTHRITIS 05/01/2007    Earlie Counts, PT 05/18/2016 11:20 AM   [facility] [address], [place], [place], [address] Phone: [phone]   Fax:  [phone]  Name: LEXUS BARLETTA MRN: [id number] Date of Birth: 10/08/1946

## 2016-05-18 NOTE — Progress Notes (Signed)
Oncology Nurse Navigator Documentation  Oncology Nurse Navigator Flowsheets 05/18/2016  Navigator Location [facility]-Med Onc  Navigator Encounter Type Clinic/MDC  Abnormal Finding Date -  Confirmed Diagnosis Date -  Patient Visit Type MedOnc;RadOnc;Initial  Treatment Phase Pre-Tx/Tx Discussion  Barriers/Navigation Needs Family concerns;Education;Coordination of Care  Education Newly Diagnosed Cancer Education;Coping with Diagnosis/ Prognosis;Understanding Cancer/ Treatment Options;Preparing for Upcoming  Treatment  Interventions Coordination of Care--PET scan;Education Method  Coordination of Care Radiology--PET scan scheduled 05/21/16 after PA completed by managed care-provided appointment and prep instructions to patient/family  Education Method Verbal;Written;Teach-back  Support Groups/Services GI Support Group;[place];Other--[facility] Support Services  Acuity Level 2  Time Spent with Patient 28  Met with patient, wife Manuela Schwartz and daughter, Deloris during new patient [place] visit. Explained the role of the GI Nurse Navigator and provided New Patient Packet with information on: 1. Esophageal cancer 2. Support groups 3. Advanced Directives 4. Fall Safety Plan Answered questions, reviewed current treatment plan using TEACH back and provided emotional support. Provided copy of current treatment plan. Encouraged him to call if he develops more pain or more difficulty swallowing. He is not receptive to home PT at this time, but says he will try to increase his activity to 30 minutes/day in intervals. Understands he will tolerate treatment better if he is active and continues high protein diet.   Merceda Elks, RN, BSN GI Oncology [place]

## 2016-05-18 NOTE — Progress Notes (Signed)
[place]  [phone] Fax:([phone]) [place] Note   Patient Care Team: Tammi Sou, MD as PCP - General (Family Medicine) Ulyses Southward, MD as Consulting Physician (Allergy and Immunology) Calton Dach, MD as Consulting Physician (Optometry) Sueanne Margarita, MD as Consulting Physician (Cardiology) Jerene Bears, MD as Consulting Physician (Gastroenterology) Latanya Maudlin, MD as Consulting Physician (Orthopedic Surgery) Jodi Marble, MD as Consulting Physician (Otolaryngology) 05/18/2016   REFERRAL PHYSICIAN: Dr. Hilarie Fredrickson   CHIEF COMPLAINTS/PURPOSE OF CONSULTATION:  Newly diagnosed GE junction cancer  Oncology History   Cancer of cardio-esophageal junction [facility])   Staging form: Stomach, AJCC 7th Edition     Clinical stage from 05/04/2016: Stage IV (TX, N2, M1) - Signed by Truitt Merle, MD on 05/18/2016        Cancer of cardio-esophageal junction ([place])   05/04/2016 Initial Diagnosis Cancer of cardio-esophageal junction ([place])   05/04/2016 Procedure EGD showed a large ulcerating mass with no active bleeding at the gastroesophageal junction extending into the gastric cardia. The mass was not obstructing and partially circumferential, extends approximately 5 cm. Nonbleeding erosive gastropathy.   05/04/2016 Initial Biopsy Esophageal gastric junction biopsy showed a poorly differentiated carcinoma underlying the squamous mucosa. There is lymphovascular invasion. No Intestinal metaplasia. IHC weakly positive CK5/6, p63 (-), favor squamous.     05/09/2016 Imaging CT CAP w contrast showed mild wall thickening involving the distal esophagus and proximal stomach compatible with known cancer, enlarged mediastinal and upper abdominal lymph nodes are highly suspicious for metastatic adenopathy, propable liver cirrhosis.    HISTORY OF PRESENTING ILLNESS:  [name] 70 y.o. male is here because of His reason that diagnosed GE junction  carcinoma. He is a comment of by his wife and daughter to our multidisciplinary chart clinic today.  He has been having progressive dysphagia and odynophagia for 2 month, he has been eating soft diet only in the past one week. He has pain in the mid chest and epigastric area only when he eats, with burning sensation, no nausea, abdominal bloating, or other discomfort. His appetite has remained well, no weight loss,   He has multiple medical problems, especially heart failure, he has been on oxygen continuously for 5-6 years. He is morbidly obese, has diabetes, hypertension, mild neuropathy, OSA etc. He has very sedentary life style, he is resistant in the chair and watch TV during the day. He only comes out for shopping in a church a few times a week. He uses a [employer], he can walk for a few hundrends feet before he has to stop to catch his breasts. He denies cough or sputum production, no GI discomfort, he has been having mild dirrhea, loose stool, twice daily, no melena or hematochezia.   MEDICAL HISTORY:  Past Medical History  Diagnosis Date  . DIABETES MELLITUS, TYPE II 06/24/2007    No diab retpthy as of 08/05/15 eye exam.  . HYPERLIPIDEMIA 06/24/2007  . HYPERTENSION 05/01/2007  . Hypoxemia 01/27/2010  . OBESITY 09/02/2008  . OSTEOARTHRITIS 05/01/2007  . Chronic diastolic CHF (congestive heart failure) ([place]) 02/25/2009  . Asthma     as a child  . Cataract     multiple types, bilateral  . History of kidney stones   . Pruritic condition 05/2014    Allergist summer 2015, no new testing.  . Dermatitis 05/2014  . Coronary artery disease     chronically occluded LAD and diagonal with right to left collaterals,  mild disease in the left circ and moderate disease in the mid RCA on medical management with Imdur, ASA, and Plavix.  . Myocardial infarction [facility])     pt states he was informed per MD that he has had one but pt was unaware   . COPD (chronic obstructive pulmonary disease) ([place])   .  Obesity hypoventilation syndrome (HCC)     oxygen 24/7 (2 liters [place] as of 02/2016)  . 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 OSA (obstructive sleep apnea)     not tested; pt scored 4 per stop bang tool results sent to PCP   . Essential hypertension 05/01/2007    Qualifier: Diagnosis of  By: Tiney Rouge CMA, Ellison Hughs     . Presbycusis of both ears 05/2015    [facility]  . Chronic renal insufficiency, stage III (moderate) 2017    Stage II/III (GFR around 60)  . Hyperkalemia 12/2015    Decreased ACE-I by 50% in response, then potassium normalized.  . On home oxygen therapy     Oxygen @ 2l/m nasally 24/7 hours  . Esophageal cancer ([place]) 04/2016    poorly differentiated carcinoma (Dr. Pyrtle--EGD).  CT C/A/P showed metastatic adenopathy in mediastinum and upper abdomen 05/09/16.  [name] field defect 05/10/16    Per Dr. Melina Fiddler, O.D.: Rt, loss of inf/temp quad and some loss sup/temp.  ?CVA  ? Pituitary tumor? ?Brain met.    SURGICAL HISTORY: Past Surgical History  Procedure Laterality Date  . Appendectomy    . Wrist surgery Right     right fx  . Shoulder surgery Right     right fx  . Lumbar disc surgery  2001  . Carpal tunnel release Left   . Cataract extraction w/phaco Right 04/29/2013    Procedure: CATARACT EXTRACTION PHACO AND INTRAOCULAR LENS PLACEMENT (IOC);  Surgeon: Adonis Brook, MD;  Location: [place];  Service: Ophthalmology;  Laterality: Right;  . Colonoscopy w/ polypectomy  08/2011    Many polyps--all hyperplastic, severe diverticulosis, int hem.  [employer] hemoccult testing via [facility] 06/22/15 NEG  . Left heart catheterization with coronary angiogram N/A 10/26/2013    Procedure: LEFT HEART CATHETERIZATION WITH CORONARY ANGIOGRAM;  Surgeon: Jettie Booze, MD;  Location[facility] CATH LAB;  Service: Cardiovascular;  Laterality: N/A;  . Tonsillectomy    . Cardiac catheterization    . Knee arthroscopy with medial menisectomy Right  12/16/2014    Procedure: RIGHT KNEE ARTHROSCOPY WITH MEDIAL MENISECTOMY microfracture medial femoral condyle abrasion condroplasty medial femoral condyle lateral menisectomy;  Surgeon: Tobi Bastos, MD;  Location: [facility];  Service: Orthopedics;  Laterality: Right;  . Transthoracic echocardiogram  03/2016    EF 55-60%, grade I DD, LAE  . Cataract extraction, bilateral    . Esophagogastroduodenoscopy N/A 05/04/2016    Procedure: ESOPHAGOGASTRODUODENOSCOPY (EGD);  Surgeon: Jerene Bears, MD;  Location: [facility];  Service: Gastroenterology;  Laterality: N/A;  . Balloon dilation N/A 05/04/2016    Procedure: BALLOON DILATION;  Surgeon: Jerene Bears, MD;  Location: [facility];  Service: Gastroenterology;  Laterality: N/A;    SOCIAL HISTORY: Social History   Social History  . Marital Status: Married    Spouse Name: N/A  . Number of Children: 2  . Years of Education: N/A   Occupational History  . Retired    Social History Main Topics  . Smoking status: Former Smoker -- 1.50 packs/day for  30 years    Types: Cigarettes, Pipe, Cigars    Quit date: 10/30/1983  . Smokeless tobacco: Current User    Types: Chew  . Alcohol Use: No  . Drug Use: No  . Sexual Activity: Not on file   Other Topics Concern  . Not on file   Social History Narrative   Married, one son and one daughter.   His daughter and her two children live with him.   Coffee daily.  Former smoker.  No alcohol.   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       FAMILY HISTORY: Family History  Problem Relation Age of Onset  . Aneurysm Father   . Alcohol abuse Father   . Colon cancer Neg Hx   . Heart attack Mother   . Diabetes Maternal Aunt     ALLERGIES:  has No Known Allergies.  MEDICATIONS:  Current Outpatient Prescriptions  Medication Sig Dispense Refill  . aspirin 81 MG tablet Take 81 mg by mouth daily.      Marland Kitchen atorvastatin (LIPITOR) 80 MG tablet Take 1 tablet (80 mg total) by mouth  daily. 90 tablet 3  . beta carotene w/minerals (OCUVITE) tablet Take 1 tablet by mouth daily.    . cetirizine (ZYRTEC) 10 MG tablet Take 10 mg by mouth daily.    . citalopram (CELEXA) 20 MG tablet Take 1 tablet (20 mg total) by mouth daily. 90 tablet 3  . clonazePAM (KLONOPIN) 1 MG tablet Take 1 tablet (1 mg total) by mouth 2 (two) times daily as needed for anxiety. 60 tablet 5  . cromolyn (OPTICROM) 4 % ophthalmic solution Place 1 drop into both eyes 4 (four) times daily.    Marland Kitchen diltiazem (CARDIZEM CD) 120 MG 24 hr capsule TAKE ONE CAPSULE BY MOUTH ONCE DAILY 30 capsule 5  . ezetimibe (ZETIA) 10 MG tablet Take 1 tablet (10 mg total) by mouth daily. 30 tablet 11  . gabapentin (NEURONTIN) 300 MG capsule TAKE ONE CAPSULE BY MOUTH THREE TIMES DAILY 270 capsule 3  . glucose blood (ONE TOUCH ULTRA TEST) test strip TEST 3 TIMES A DAY AS DIRECTED 100 each 11  . HUMALOG KWIKPEN 200 UNIT/ML SOPN INJECT 24 UNITS SUBCUTANEOUSLY THREE TIMES DAILY BEFORE MEALS 15 mL 6  . HUMULIN N KWIKPEN 100 UNIT/ML Kiwkpen INJECT 120 UNITS SUBCUTANEOUSLY WITH BREAKFAST AND 50 UNITS SUBCUTANEOUSLY WITH SUPPER 60 mL 6  . hydrOXYzine (ATARAX/VISTARIL) 25 MG tablet 1 tab at supper and 1 at bedtime (for insomnia) 60 tablet 11  . isosorbide mononitrate (IMDUR) 60 MG 24 hr tablet Take 1 tablet (60 mg total) by mouth at bedtime. 30 tablet 11  . lisinopril (PRINIVIL,ZESTRIL) 40 MG tablet Take 20 mg by mouth daily.    . metFORMIN (GLUCOPHAGE) 1000 MG tablet TAKE ONE TABLET BY MOUTH TWICE DAILY WITH MEALS 60 tablet 6  . Omega-3 Fatty Acids ([employer] FISH OIL) 1000 MG CAPS Take 2 tablets by mouth 2 (two) times daily. 90 capsule   . ONETOUCH DELICA LANCETS 16X MISC 1 each by Other route 3 (three) times daily. USE TO TEST 3 TIMES A DAY 100 each 11  . OXYGEN Inhale 2 L/min into the lungs continuous.    . pantoprazole (PROTONIX) 40 MG tablet Take 1 tablet (40 mg total) by mouth 2 (two) times daily. 60 tablet 3  . sucralfate (CARAFATE) 1 GM/10ML  suspension Take 10 mLs (1 g total) by mouth 4 (four) times daily. 420 mL 1  .  torsemide (DEMADEX) 20 MG tablet Take 1 tablet (20 mg total) by mouth daily. Take extra tab as directed for weight gain. 45 tablet 11   No current facility-administered medications for this visit.    REVIEW OF SYSTEMS:   Constitutional: Denies fevers, chills or abnormal night sweats   Eyes: Denies blurriness of vision, double vision or watery eyes Ears, nose, mouth, throat, and face: Denies mucositis or sore throat Respiratory: Denies cough, dyspnea or wheezes Cardiovascular: Denies palpitation, chest discomfort or lower extremity swelling Gastrointestinal:  Denies nausea, heartburn or change in bowel habits Skin: Denies abnormal skin rashes Lymphatics: Denies new lymphadenopathy or easy bruising Neurological:Denies numbness, tingling or new weaknesses Behavioral/Psych: Mood is stable, no new changes  All other systems were reviewed with the patient and are negative.  PHYSICAL EXAMINATION: ECOG PERFORMANCE STATUS: 3 - Symptomatic, >50% confined to bed  Filed Vitals:   05/18/16 0844  BP: 105/47  Pulse: 89  Temp: 98.5 F (36.9 C)  Resp: 18   Filed Weights   05/18/16 0844  Weight: 309 lb 8 oz (140.388 kg)    GENERAL:alert, no distress and comfortable, morbid obese, sitting in wheelchair with nasal cannula oxygen SKIN: skin color, texture, turgor are normal, no rashes or significant lesions EYES: normal, conjunctiva are pink and non-injected, sclera clear OROPHARYNX:no exudate, no erythema and lips, buccal mucosa, and tongue normal  NECK: supple, thyroid normal size, non-tender, without nodularity LYMPH:  no palpable lymphadenopathy in the cervical, axillary or inguinal LUNGS: (+) Scatter rales on bilateral lung base, no wheezing HEART: regular rate & rhythm and no murmurs and no lower extremity edema ABDOMEN:abdomen soft, non-tender and normal bowel sounds Musculoskeletal:no cyanosis of digits and  no clubbing  PSYCH: alert & oriented x 3 with fluent speech NEURO: no focal motor/sensory deficits EXT: Trace edema, he wears compression stocks  LABORATORY DATA:  I have reviewed the data as listed CBC Latest Ref Rng 03/16/2016 03/15/2016 12/08/2014  WBC 4.0 - 10.5 K/uL 9.2 9.0 7.5  Hemoglobin 13.0 - 17.0 g/dL 12.5(L) 13.3 12.7(L)  Hematocrit 39.0 - 52.0 % 41.1 42.1 41.9  Platelets 150 - 400 K/uL 214 245 194   CMP Latest Ref Rng 05/07/2016 03/16/2016 03/15/2016  Glucose 70 - 99 mg/dL 129(H) 96 120(H)  BUN 6 - 23 mg/dL _0 Creatinine 0.40 - 1.50 mg/dL 1.30 1.04 0.96  Sodium 135 - 145 mEq/L 136 137 137  Potassium 3.5 - 5.1 mEq/L 4.6 4.9 4.6  Chloride 96 - 112 mEq/L 99 98(L) 97(L)  CO2 19 - 32 mEq/L _1 Calcium 8.4 - 10.5 mg/dL 9.0 9.4 9.6  Total Protein 6.1 - 8.1 g/dL - - -  Total Bilirubin 0.2 - 1.2 mg/dL - - -  Alkaline Phos 40 - 115 U/L - - -  AST 10 - 35 U/L - - -  ALT 9 - 46 U/L - - -    PATHOLOGY REPORT Diagnosis 05/04/2016 1. Esophagogastric junction, biopsy, ulcerative mass - POORLY DIFFERENTIATED CARCINOMA, SEE COMMENT. 2. Stomach, biopsy - REACTIVE GASTROPATHY. - NEGATIVE FOR HELICOBACTER PYLORI. - NO INTESTINAL METAPLASIA, DYSPLASIA, OR MALIGNANCY. Microscopic Comment 1. The majority of the specimen consists of gastroesophageal mucosa with reflux changes. There is a small focus of tumor underlying the squamous mucosa. There is lymphovascular invasion. There is no background intestinal metaplasia (Barrett's esophagus). Immunohistochemistry reveals only very focal weak cytokeratin 5/6, negative p63, and negative mucicarmine in the limited remaining tumor. Dr. Saralyn Pilar has reviewed the case. The case was  called to Dr. Hilarie Fredrickson on 05/08/2016. 2. A Warthin-Starry stain is performed to determine the possibility of the presence of Helicobacter pylori. The Warthin-Starry stain is negative for organisms of Helicobacter pylori.  RADIOGRAPHIC STUDIES: I have personally  reviewed the radiological images as listed and agreed with the findings in the report. Ct Chest W Contrast  05/09/2016  CLINICAL DATA:  Esophageal mass EXAM: CT CHEST, ABDOMEN, AND PELVIS WITH CONTRAST TECHNIQUE: Multidetector CT imaging of the chest, abdomen and pelvis was performed following the standard protocol during bolus administration of intravenous contrast. CONTRAST:  124m ISOVUE-300 IOPAMIDOL (ISOVUE-300) INJECTION 61% COMPARISON:  None. FINDINGS: CT CHEST FINDINGS Mediastinum/Lymph Nodes: The heart size is normal. No pericardial effusion. There is aortic atherosclerosis. Calcification involving the RCA, LAD and left circumflex coronary artery is identified. The trachea appears patent and is midline. In the posterior mediastinum there is an enlarged lymph node adjacent to the esophagus and aorta which measures 1.7 cm, image 37 of series 2. Borderline enlargement of the sub- carinal lymph node has a short axis of 1 cm, image 28 of series 2. There is a proximal right paratracheal lymph node which measures 9 mm, image 9 of series 2. No supraclavicular or axillary adenopathy. Lungs/Pleura: There is no pleural fluid identified. There is peripheral and basilar predominant interstitial reticulation identified. Bronchiectasis noted within the posterior medial left lower lobe. No frank honeycombing. No airspace consolidation or atelectasis identified. No suspicious pulmonary nodule or mass. Musculoskeletal: Multilevel degenerative disc disease is present throughout the thoracic spine. No aggressive lytic or sclerotic bone lesions identified. Advanced arthropathic changes are noted involving the right glenohumeral joint. CT ABDOMEN PELVIS FINDINGS Hepatobiliary: There is hypertrophy of the lateral segment of left lobe and caudate lobe of liver. The liver has a diffusely irregular contour. No focal liver abnormalities identified. Stones are noted within the dependent portion of the gallbladder which measure up to  4 mm. No gallbladder wall thickening or biliary dilatation noted. Pancreas: No mass, inflammatory changes, or other significant abnormality. Spleen: Within normal limits in size and appearance. Adrenals/Urinary Tract: The adrenal glands are normal. Right kidney cysts are identified. Scratch set bilateral renal cysts noted. Cortical calcification involves the anterior medial aspect of the midpole of right kidney. No obstructive uropathy identified. The urinary bladder appears normal. Stomach/Bowel: Mild thickening of the distal esophagus and proximal stomach around the level of the GE junction identified. There is no pathologic dilatation of the small or large bowel loops identified. Numerous distal colonic diverticula identified. No acute inflammation. Vascular/Lymphatic: Calcified atherosclerotic disease involves the abdominal aorta. No aneurysm. Index gastrohepatic ligament lymph node measures 1.9 cm, image 53 of series 2. There is a celiac axis lymph node which has a short axis measuring 1.4 cm, image 56 of series 2. Periaortic lymph node is enlarged measuring 1.7 cm, image 69 of series 2. There is an aortocaval node which measures 1.2 cm, image 65 of series 2. No pelvic or inguinal adenopathy. Reproductive: No mass or other significant abnormality. Other: There is no ascites or focal fluid collections within the abdomen or pelvis. No peritoneal nodularity. Musculoskeletal: Degenerative disc disease noted within the lumbar spine. No suspicious bone lesions. IMPRESSION: 1. Mild wall thickening involving the distal esophagus and proximal stomach identified compatible with biopsy-proven esophageal carcinoma. 2. Enlarged mediastinal and upper abdominal lymph nodes are identified compatible with metastatic adenopathy. 3. Morphologic features of the liver which are concerning for cirrhosis. No focal liver lesion identified. 4. Aortic atherosclerosis and multi vessel coronary artery  calcification. 5. Interstitial  changes within the lungs including subpleural reticulation and mild bronchiectasis. The findings are nonspecific but may be seen with early usual interstitial pneumonitis (UIP) or nonspecific interstitial pneumonitis (NSIP). 6. Gallstones. Electronically Signed   By: Kerby Moors M.D.   On: 05/09/2016 13:47   Ct Abdomen Pelvis W Contrast  05/09/2016  CLINICAL DATA:  Esophageal mass EXAM: CT CHEST, ABDOMEN, AND PELVIS WITH CONTRAST TECHNIQUE: Multidetector CT imaging of the chest, abdomen and pelvis was performed following the standard protocol during bolus administration of intravenous contrast. CONTRAST:  12m ISOVUE-300 IOPAMIDOL (ISOVUE-300) INJECTION 61% COMPARISON:  None. FINDINGS: CT CHEST FINDINGS Mediastinum/Lymph Nodes: The heart size is normal. No pericardial effusion. There is aortic atherosclerosis. Calcification involving the RCA, LAD and left circumflex coronary artery is identified. The trachea appears patent and is midline. In the posterior mediastinum there is an enlarged lymph node adjacent to the esophagus and aorta which measures 1.7 cm, image 37 of series 2. Borderline enlargement of the sub- carinal lymph node has a short axis of 1 cm, image 28 of series 2. There is a proximal right paratracheal lymph node which measures 9 mm, image 9 of series 2. No supraclavicular or axillary adenopathy. Lungs/Pleura: There is no pleural fluid identified. There is peripheral and basilar predominant interstitial reticulation identified. Bronchiectasis noted within the posterior medial left lower lobe. No frank honeycombing. No airspace consolidation or atelectasis identified. No suspicious pulmonary nodule or mass. Musculoskeletal: Multilevel degenerative disc disease is present throughout the thoracic spine. No aggressive lytic or sclerotic bone lesions identified. Advanced arthropathic changes are noted involving the right glenohumeral joint. CT ABDOMEN PELVIS FINDINGS Hepatobiliary: There is  hypertrophy of the lateral segment of left lobe and caudate lobe of liver. The liver has a diffusely irregular contour. No focal liver abnormalities identified. Stones are noted within the dependent portion of the gallbladder which measure up to 4 mm. No gallbladder wall thickening or biliary dilatation noted. Pancreas: No mass, inflammatory changes, or other significant abnormality. Spleen: Within normal limits in size and appearance. Adrenals/Urinary Tract: The adrenal glands are normal. Right kidney cysts are identified. Scratch set bilateral renal cysts noted. Cortical calcification involves the anterior medial aspect of the midpole of right kidney. No obstructive uropathy identified. The urinary bladder appears normal. Stomach/Bowel: Mild thickening of the distal esophagus and proximal stomach around the level of the GE junction identified. There is no pathologic dilatation of the small or large bowel loops identified. Numerous distal colonic diverticula identified. No acute inflammation. Vascular/Lymphatic: Calcified atherosclerotic disease involves the abdominal aorta. No aneurysm. Index gastrohepatic ligament lymph node measures 1.9 cm, image 53 of series 2. There is a celiac axis lymph node which has a short axis measuring 1.4 cm, image 56 of series 2. Periaortic lymph node is enlarged measuring 1.7 cm, image 69 of series 2. There is an aortocaval node which measures 1.2 cm, image 65 of series 2. No pelvic or inguinal adenopathy. Reproductive: No mass or other significant abnormality. Other: There is no ascites or focal fluid collections within the abdomen or pelvis. No peritoneal nodularity. Musculoskeletal: Degenerative disc disease noted within the lumbar spine. No suspicious bone lesions. IMPRESSION: 1. Mild wall thickening involving the distal esophagus and proximal stomach identified compatible with biopsy-proven esophageal carcinoma. 2. Enlarged mediastinal and upper abdominal lymph nodes are  identified compatible with metastatic adenopathy. 3. Morphologic features of the liver which are concerning for cirrhosis. No focal liver lesion identified. 4. Aortic atherosclerosis and multi vessel coronary artery  calcification. 5. Interstitial changes within the lungs including subpleural reticulation and mild bronchiectasis. The findings are nonspecific but may be seen with early usual interstitial pneumonitis (UIP) or nonspecific interstitial pneumonitis (NSIP). 6. Gallstones. Electronically Signed   By: Kerby Moors M.D.   On: 05/09/2016 13:47   EGD 05/04/2016 A large, ulcerating mass with no active bleeding and no stigmata of recent bleeding was found at the gastroesophageal junction extending into the gastric cardia, beginning 40 cm from the incisors. The mass was non-obstructing and partially circumferential (involving one-half of the lumen circumference). The mass extends approximately 5 cm. IMPRESSION:  -Likely malignant esophageal tumor was found at the gastroesophageal junction extending into the gastric cardia. Biopsied. - Non-bleeding erosive gastropathy. Biopsied. - Erythematous duodenopathy. - Normal second portion of the duodenum.  ASSESSMENT & PLAN: 70 year old Caucasian male, with multiple comorbidities, including hypertension, diabetes, OSA, coronary artery disease, diastolic CHF, on continuous oxygen, morbid obesity, presented with progressive dysphagia and odynophagia.  1. Cancer of cardio-esophageal junction, poorly differentiated, [id number], stage IV  -I reviewed his CT scan, EGD, and the biopsy results with patient and his family members in great details. -His biopsy pathology was reviewed in our tumor board 2 days ago, the morphology and weakly positive for CK 5/6, fevers squamous cell carcinoma -His CT scan findings are very concerning for distant metastasis to abdominal lymph nodes. I recommend him to have a PET scan for further evaluation. Those nodes are difficult to  biopsy due to the location  -If the abdominal adenopathy is positive on PET scan, I think it's quite certain he has metastatic disease. -We discussed the aggressive nature of esophageal cancer, an incurable nature of his disease due to the distant metastasis. The goal of therapy is palliative, to prolong his life and palliate his symptoms. -I discussed his case with radiation oncologist Dr. Lisbeth Renshaw today, we recommend palliative radiation first, to improve his dysphagia and odynophagia. -Due to the palliative nature of treatment goal, and multiple comorbidities and poor performance status, I do not recommend concurrent chemoradiation.  -I will recommend sequential chemotherapy after radiation, if he recovers well. Giving the probable squamous histology, I recommend carboplatin and Taxol.  -I'll plan to see him back in 3 weeks, before he completed radiation, to assess him and prepare him to start chemotherapy.  2. CAD, diastolic CHF, on continuous oxygen -He will continue follow-up with his cardiologist Dr. Radford Pax -His Plavix has been held since the EGD and biopsy, he is on baby aspirin. -Need to watch his fluid intake during his chemotherapy  3. DM, HTN -We discussed his blood pressure and glucose need to be monitored closely during the chemotherapy, which may affect his blood glucose and blood pressure -He will continue medication, monitor his sugar and blood pressure at home, and follow-up with his primary care physician  4. OSA, morbid obesity -We discussed healthy diet, I encouraged him to to be more physically active, and consider home PT -He will be seen by our dietitian Pamala Hurry today. I strongly encouraged him to follow-up with dietitian during the therapy for nutrition support.   Plan -PET scan -He will likely start palliative radiation in a few weeks -I plan to see him back in 3 weeks to finalize his chemotherapy  All questions were answered. The patient knows to call the clinic  with any problems, questions or concerns. I spent 55 minutes counseling the patient face to face. The total time spent in the appointment was 60 minutes and more than 50% was  on counseling.     Truitt Merle, MD 05/18/2016 11:02 AM

## 2016-05-21 ENCOUNTER — Telehealth: Payer: Self-pay | Admitting: *Deleted

## 2016-05-21 ENCOUNTER — Ambulatory Visit (HOSPITAL_COMMUNITY)
Admission: RE | Admit: 2016-05-21 | Discharge: 2016-05-21 | Disposition: A | Discharge status: Home / Self Care | Payer: Medicare Other | Source: Ambulatory Visit | Attending: Radiation Oncology | Admitting: Radiation Oncology

## 2016-05-21 DIAGNOSIS — C779 Secondary and unspecified malignant neoplasm of lymph node, unspecified: Secondary | ICD-10-CM | POA: Insufficient documentation

## 2016-05-21 DIAGNOSIS — C160 Malignant neoplasm of cardia: Secondary | ICD-10-CM | POA: Diagnosis not present

## 2016-05-21 DIAGNOSIS — C155 Malignant neoplasm of lower third of esophagus: Secondary | ICD-10-CM | POA: Diagnosis not present

## 2016-05-21 DIAGNOSIS — I700 Atherosclerosis of aorta: Secondary | ICD-10-CM | POA: Insufficient documentation

## 2016-05-21 LAB — GLUCOSE, CAPILLARY: GLUCOSE-CAPILLARY: 97 mg/dL (ref 65–99)

## 2016-05-21 MED ORDER — FLUDEOXYGLUCOSE F - 18 (FDG) INJECTION
13.8900 | Freq: Once | INTRAVENOUS | Status: AC | PRN
  Administered 2016-05-21: 13.89 via INTRAVENOUS

## 2016-05-21 NOTE — Telephone Encounter (Signed)
[name] called stating that they received notes from pts optometrist who recently did a visual field. Results showed that there has been some sort of stroke or mass on the left parietal area of the brain. Dr. Talbert Forest wanted to make sure that someone is going to follow up on this.

## 2016-05-23 ENCOUNTER — Telehealth: Payer: Self-pay | Admitting: *Deleted

## 2016-05-23 NOTE — Telephone Encounter (Signed)
Patient left VM that his GI doctor wants him to stay on the Carafate, however he has no refills. They have referred him to Dr. Burr Medico to refill. Please call in to [facility] on [address]. Forwaded this message to Dr. Ernestina Penna collaborative nurse.

## 2016-05-24 ENCOUNTER — Other Ambulatory Visit: Payer: Self-pay | Admitting: *Deleted

## 2016-05-24 DIAGNOSIS — C160 Malignant neoplasm of cardia: Secondary | ICD-10-CM

## 2016-05-24 MED ORDER — SUCRALFATE 1 GM/10ML PO SUSP: 1.0000 g | Freq: Four times a day (QID) | ORAL | 1 refills | Status: DC

## 2016-05-28 ENCOUNTER — Ambulatory Visit
Admission: RE | Admit: 2016-05-28 | Discharge: 2016-05-28 | Disposition: A | Discharge status: Home / Self Care | Payer: Medicare Other | Source: Ambulatory Visit | Attending: Radiation Oncology | Admitting: Radiation Oncology

## 2016-05-28 DIAGNOSIS — Z510 Encounter for antineoplastic radiation therapy: Secondary | ICD-10-CM | POA: Diagnosis not present

## 2016-05-28 DIAGNOSIS — C160 Malignant neoplasm of cardia: Secondary | ICD-10-CM

## 2016-05-28 NOTE — Progress Notes (Signed)
I discussed the patient's PET scan with him today. Unfortunately, the distant lymph nodes were hypermetabolic which are consistent with metastatic disease. The extent of the disease is beyond what we discussed we would consider as regional disease in GI conference. As such, I will proceed with a palliative course of radiation treatment. The patient has also seen Dr. Burr Medico for systemic treatment.

## 2016-05-30 DIAGNOSIS — Z510 Encounter for antineoplastic radiation therapy: Secondary | ICD-10-CM | POA: Diagnosis not present

## 2016-05-30 DIAGNOSIS — C160 Malignant neoplasm of cardia: Secondary | ICD-10-CM | POA: Diagnosis not present

## 2016-06-04 ENCOUNTER — Ambulatory Visit
Admission: RE | Admit: 2016-06-04 | Discharge: 2016-06-04 | Disposition: A | Discharge status: Home / Self Care | Payer: Medicare Other | Source: Ambulatory Visit | Attending: Radiation Oncology | Admitting: Radiation Oncology

## 2016-06-04 DIAGNOSIS — C160 Malignant neoplasm of cardia: Secondary | ICD-10-CM | POA: Diagnosis not present

## 2016-06-04 DIAGNOSIS — Z510 Encounter for antineoplastic radiation therapy: Secondary | ICD-10-CM | POA: Diagnosis not present

## 2016-06-05 ENCOUNTER — Ambulatory Visit
Admission: RE | Admit: 2016-06-05 | Discharge: 2016-06-05 | Disposition: A | Discharge status: Home / Self Care | Payer: Medicare Other | Source: Ambulatory Visit | Attending: Radiation Oncology | Admitting: Radiation Oncology

## 2016-06-05 DIAGNOSIS — Z510 Encounter for antineoplastic radiation therapy: Secondary | ICD-10-CM | POA: Diagnosis not present

## 2016-06-05 DIAGNOSIS — C160 Malignant neoplasm of cardia: Secondary | ICD-10-CM | POA: Diagnosis not present

## 2016-06-06 ENCOUNTER — Ambulatory Visit
Admission: RE | Admit: 2016-06-06 | Discharge: 2016-06-06 | Disposition: A | Discharge status: Home / Self Care | Payer: Medicare Other | Source: Ambulatory Visit | Attending: Radiation Oncology | Admitting: Radiation Oncology

## 2016-06-06 DIAGNOSIS — C160 Malignant neoplasm of cardia: Secondary | ICD-10-CM | POA: Diagnosis not present

## 2016-06-06 DIAGNOSIS — Z510 Encounter for antineoplastic radiation therapy: Secondary | ICD-10-CM | POA: Diagnosis not present

## 2016-06-07 ENCOUNTER — Ambulatory Visit
Admission: RE | Admit: 2016-06-07 | Discharge: 2016-06-07 | Disposition: A | Discharge status: Home / Self Care | Payer: Medicare Other | Source: Ambulatory Visit | Attending: Radiation Oncology | Admitting: Radiation Oncology

## 2016-06-07 DIAGNOSIS — Z510 Encounter for antineoplastic radiation therapy: Secondary | ICD-10-CM | POA: Diagnosis not present

## 2016-06-07 DIAGNOSIS — C160 Malignant neoplasm of cardia: Secondary | ICD-10-CM | POA: Diagnosis not present

## 2016-06-07 DIAGNOSIS — J449 Chronic obstructive pulmonary disease, unspecified: Secondary | ICD-10-CM | POA: Diagnosis not present

## 2016-06-08 ENCOUNTER — Telehealth: Payer: Self-pay | Admitting: Hematology

## 2016-06-08 ENCOUNTER — Ambulatory Visit
Admission: RE | Admit: 2016-06-08 | Discharge: 2016-06-08 | Disposition: A | Discharge status: Home / Self Care | Payer: Medicare Other | Source: Ambulatory Visit | Attending: Radiation Oncology | Admitting: Radiation Oncology

## 2016-06-08 ENCOUNTER — Ambulatory Visit (HOSPITAL_BASED_OUTPATIENT_CLINIC_OR_DEPARTMENT_OTHER): Payer: Medicare Other | Admitting: Hematology

## 2016-06-08 ENCOUNTER — Encounter: Payer: Self-pay | Admitting: Radiation Oncology

## 2016-06-08 ENCOUNTER — Encounter: Payer: Self-pay | Admitting: Hematology

## 2016-06-08 ENCOUNTER — Other Ambulatory Visit (HOSPITAL_BASED_OUTPATIENT_CLINIC_OR_DEPARTMENT_OTHER): Payer: Medicare Other

## 2016-06-08 VITALS — BP 133/63 | HR 97 | Temp 98.6°F | Resp 18 | Ht 68.0 in | Wt 299.0 lb

## 2016-06-08 DIAGNOSIS — E119 Type 2 diabetes mellitus without complications: Secondary | ICD-10-CM | POA: Diagnosis not present

## 2016-06-08 DIAGNOSIS — I5030 Unspecified diastolic (congestive) heart failure: Secondary | ICD-10-CM

## 2016-06-08 DIAGNOSIS — Z79899 Other long term (current) drug therapy: Secondary | ICD-10-CM

## 2016-06-08 DIAGNOSIS — C160 Malignant neoplasm of cardia: Secondary | ICD-10-CM | POA: Diagnosis not present

## 2016-06-08 DIAGNOSIS — Z510 Encounter for antineoplastic radiation therapy: Secondary | ICD-10-CM

## 2016-06-08 DIAGNOSIS — Z7982 Long term (current) use of aspirin: Secondary | ICD-10-CM | POA: Insufficient documentation

## 2016-06-08 DIAGNOSIS — I10 Essential (primary) hypertension: Secondary | ICD-10-CM

## 2016-06-08 DIAGNOSIS — Z7984 Long term (current) use of oral hypoglycemic drugs: Secondary | ICD-10-CM | POA: Insufficient documentation

## 2016-06-08 DIAGNOSIS — R682 Dry mouth, unspecified: Secondary | ICD-10-CM

## 2016-06-08 LAB — CBC WITH DIFFERENTIAL/PLATELET
BASO%: 0.4 % (ref 0.0–2.0)
BASOS ABS: 0 10*3/uL (ref 0.0–0.1)
EOS%: 4.7 % (ref 0.0–7.0)
Eosinophils Absolute: 0.5 10*3/uL (ref 0.0–0.5)
HEMATOCRIT: 36.7 % — AB (ref 38.4–49.9)
HEMOGLOBIN: 12 g/dL — AB (ref 13.0–17.1)
LYMPH#: 0.6 10*3/uL — AB (ref 0.9–3.3)
LYMPH%: 6.5 % — ABNORMAL LOW (ref 14.0–49.0)
MCH: 27.1 pg — AB (ref 27.2–33.4)
MCHC: 32.6 g/dL (ref 32.0–36.0)
MCV: 83.2 fL (ref 79.3–98.0)
MONO#: 1.2 10*3/uL — ABNORMAL HIGH (ref 0.1–0.9)
MONO%: 12.6 % (ref 0.0–14.0)
NEUT%: 75.8 % — ABNORMAL HIGH (ref 39.0–75.0)
NEUTROS ABS: 7.3 10*3/uL — AB (ref 1.5–6.5)
Platelets: 268 10*3/uL (ref 140–400)
RBC: 4.41 10*6/uL (ref 4.20–5.82)
RDW: 15.4 % — AB (ref 11.0–14.6)
WBC: 9.7 10*3/uL (ref 4.0–10.3)

## 2016-06-08 LAB — COMPREHENSIVE METABOLIC PANEL
ALT: 15 U/L (ref 0–55)
AST: 17 U/L (ref 5–34)
Albumin: 3.1 g/dL — ABNORMAL LOW (ref 3.5–5.0)
Alkaline Phosphatase: 58 U/L (ref 40–150)
Anion Gap: 10 mEq/L (ref 3–11)
BUN: 27.3 mg/dL — AB (ref 7.0–26.0)
CHLORIDE: 101 meq/L (ref 98–109)
CO2: 25 meq/L (ref 22–29)
CREATININE: 1.1 mg/dL (ref 0.7–1.3)
Calcium: 9 mg/dL (ref 8.4–10.4)
EGFR: 70 mL/min/{1.73_m2} — ABNORMAL LOW (ref >90)
Glucose: 116 mg/dl (ref 70–140)
POTASSIUM: 4.7 meq/L (ref 3.5–5.1)
SODIUM: 136 meq/L (ref 136–145)
Total Bilirubin: 0.57 mg/dL (ref 0.20–1.20)
Total Protein: 7.4 g/dL (ref 6.4–8.3)

## 2016-06-08 MED ORDER — SONAFINE EX EMUL
1.0000 "application " | Freq: Two times a day (BID) | CUTANEOUS | Status: DC
  Administered 2016-06-08: 1 via TOPICAL

## 2016-06-08 NOTE — Progress Notes (Signed)
[place]  [phone] [phone]  Clinic Follow up Note   Patient Care Team: Tammi Sou, MD as PCP - General (Family Medicine) Ulyses Southward, MD as Consulting Physician (Allergy and Immunology) Calton Dach, MD as Consulting Physician (Optometry) Sueanne Margarita, MD as Consulting Physician (Cardiology) Jerene Bears, MD as Consulting Physician (Gastroenterology) Latanya Maudlin, MD as Consulting Physician (Orthopedic Surgery) Jodi Marble, MD as Consulting Physician (Otolaryngology) 06/08/2016    CHIEF COMPLAINTS:  Follow up GE junction cancer  Oncology History   Cancer of cardio-esophageal junction [facility])   Staging form: Stomach, AJCC 7th Edition     Clinical stage from 05/04/2016: Stage IV (TX, N2, M1) - Signed by Truitt Merle, MD on 05/18/2016        Cancer of cardio-esophageal junction ([place])   05/04/2016 Initial Diagnosis    Cancer of cardio-esophageal junction ([place])     05/04/2016 Procedure    EGD showed a large ulcerating mass with no active bleeding at the gastroesophageal junction extending into the gastric cardia. The mass was not obstructing and partially circumferential, extends approximately 5 cm. Nonbleeding erosive gastropathy.     05/04/2016 Initial Biopsy    Esophageal gastric junction biopsy showed a poorly differentiated carcinoma underlying the squamous mucosa. There is lymphovascular invasion. No Intestinal metaplasia. IHC weakly positive CK5/6, p63 (-), favor squamous.       05/09/2016 Imaging    CT CAP w contrast showed mild wall thickening involving the distal esophagus and proximal stomach compatible with known cancer, enlarged mediastinal and upper abdominal lymph nodes are highly suspicious for metastatic adenopathy, propable liver cirrhosis.     06/04/2016 -  Radiation Therapy    palliative radiation to esophageal cancer       HISTORY OF PRESENTING ILLNESS:  [name] 70 y.o. male is here because  of His reason that diagnosed GE junction carcinoma. He is a comment of by his wife and daughter to our multidisciplinary chart clinic today.  He has been having progressive dysphagia and odynophagia for 2 month, he has been eating soft diet only in the past one week. He has pain in the mid chest and epigastric area only when he eats, with burning sensation, no nausea, abdominal bloating, or other discomfort. His appetite has remained well, no weight loss,   He has multiple medical problems, especially heart failure, he has been on oxygen continuously for 5-6 years. He is morbidly obese, has diabetes, hypertension, mild neuropathy, OSA etc. He has very sedentary life style, he sits in the chair and watch TV most of time during the day. He only comes out for shopping for a few times a week. He uses a [employer], he can walk for a few hundrends feet before he has to stop to catch his breasts. He denies cough or sputum production, no GI discomfort, he has been having mild dirrhea, loose stool, twice daily, no melena or hematochezia.  CURRENT THERAPY: Palliative radiation  INTERIM HISTORY: [name] returns for follow-up. He is accompanied by his daughter to the clinic today. He has started radiation therapy this week, tolerating well so far. He still has mild to moderate epigastric pain when he eats, he has been taking Carafate. No significant dysphagia or odynophagia. No other new complains. He is moderate fatigue is stable, able to do self-care and tolerate light activities.    MEDICAL HISTORY:  Past Medical History:  Diagnosis Date  . Asthma    as  a child  . Cataract    multiple types, bilateral  . Chronic diastolic CHF (congestive heart failure) ([place]) 02/25/2009  . Chronic renal insufficiency, stage III (moderate) 2017   Stage II/III (GFR around 60)  . 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 COPD (chronic obstructive  pulmonary disease) ([place])   . Coronary artery disease    chronically occluded LAD and diagonal with right to left collaterals, mild disease in the left circ and moderate disease in the mid RCA on medical management with Imdur, ASA, and Plavix.  . Dermatitis 05/2014  . DIABETES MELLITUS, TYPE II 06/24/2007   No diab retpthy as of 08/05/15 eye exam.  . Esophageal cancer ([place]) 04/2016   poorly differentiated carcinoma (Dr. Pyrtle--EGD).  CT C/A/P showed metastatic adenopathy in mediastinum and upper abdomen 05/09/16.  . Essential hypertension 05/01/2007   Qualifier: Diagnosis of  By: Tiney Rouge CMA, Ellison Hughs     . History of kidney stones   . Hyperkalemia 12/2015   Decreased ACE-I by 50% in response, then potassium normalized.  Marland Kitchen HYPERLIPIDEMIA 06/24/2007  . HYPERTENSION 05/01/2007  . Hypoxemia 01/27/2010  . Myocardial infarction [facility])    pt states he was informed per MD that he has had one but pt was unaware   . OBESITY 09/02/2008  . Obesity hypoventilation syndrome (HCC)    oxygen 24/7 (2 liters [facility] as of 02/2016)  . On home oxygen therapy    Oxygen @ 2l/m nasally 24/7 hours  . OSA (obstructive sleep apnea)    not tested; pt scored 4 per stop bang tool results sent to PCP   . OSTEOARTHRITIS 05/01/2007  . Presbycusis of both ears 05/2015   [facility]  . Pruritic condition 05/2014   Allergist summer 2015, no new testing.  [name] field defect 05/10/16   Per Dr. Melina Fiddler, O.D.: Rt, loss of inf/temp quad and some loss sup/temp.  ?CVA  ? Pituitary tumor? ?Brain met.    SURGICAL HISTORY: Past Surgical History:  Procedure Laterality Date  . APPENDECTOMY    . BALLOON DILATION N/A 05/04/2016   Procedure: BALLOON DILATION;  Surgeon: Jerene Bears, MD;  Location: [facility];  Service: Gastroenterology;  Laterality: N/A;  . CARDIAC CATHETERIZATION    . CARPAL TUNNEL RELEASE Left   . CATARACT EXTRACTION W/PHACO Right 04/29/2013   Procedure: CATARACT EXTRACTION PHACO AND INTRAOCULAR LENS PLACEMENT (IOC);  Surgeon:  Adonis Brook, MD;  Location: [facility];  Service: Ophthalmology;  Laterality: Right;  . CATARACT EXTRACTION, BILATERAL    . COLONOSCOPY W/ POLYPECTOMY  08/2011   Many polyps--all hyperplastic, severe diverticulosis, int hem.  [employer] hemoccult testing via [facility] 06/22/15 NEG  . ESOPHAGOGASTRODUODENOSCOPY N/A 05/04/2016   Procedure: ESOPHAGOGASTRODUODENOSCOPY (EGD);  Surgeon: Jerene Bears, MD;  Location: [facility];  Service: Gastroenterology;  Laterality: N/A;  . KNEE ARTHROSCOPY WITH MEDIAL MENISECTOMY Right 12/16/2014   Procedure: RIGHT KNEE ARTHROSCOPY WITH MEDIAL MENISECTOMY microfracture medial femoral condyle abrasion condroplasty medial femoral condyle lateral menisectomy;  Surgeon: Tobi Bastos, MD;  Location: [facility];  Service: Orthopedics;  Laterality: Right;  . LEFT HEART CATHETERIZATION WITH CORONARY ANGIOGRAM N/A 10/26/2013   Procedure: LEFT HEART CATHETERIZATION WITH CORONARY ANGIOGRAM;  Surgeon: Jettie Booze, MD;  [facility] CATH LAB;  Service: Cardiovascular;  Laterality: N/A;  . LUMBAR [place] SURGERY  2001  . SHOULDER SURGERY Right    right fx  . TONSILLECTOMY    .  TRANSTHORACIC ECHOCARDIOGRAM  03/2016   EF 55-60%, grade I DD, LAE  . WRIST SURGERY Right    right fx    SOCIAL HISTORY: Social History   Social History  . Marital status: Married    Spouse name: susan  . Number of children: 2  . Years of education: N/A   Occupational History  . Retired    Social History Main Topics  . Smoking status: Former Smoker    Packs/day: 1.50    Years: 30.00    Types: Cigarettes, Pipe, Cigars    Quit date: 10/30/1983  . Smokeless tobacco: Current User    Types: Chew  . Alcohol use No  . Drug use: No  . Sexual activity: Not on file   Other Topics Concern  . Not on file   Social History Narrative   Married, one son and one daughter.   His daughter and her two children live with him.   Coffee daily.  Former smoker.  No alcohol.   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   Attends church weekly-   Oxygen continuous       FAMILY HISTORY: Family History  Problem Relation Age of Onset  . Aneurysm Father   . Alcohol abuse Father   . Colon cancer Neg Hx   . Heart attack Mother   . Diabetes Maternal Aunt     ALLERGIES:  has No Known Allergies.  MEDICATIONS:  Current Outpatient Prescriptions  Medication Sig Dispense Refill  . aspirin 81 MG tablet Take 81 mg by mouth daily.      Marland Kitchen atorvastatin (LIPITOR) 80 MG tablet Take 1 tablet (80 mg total) by mouth daily. 90 tablet 3  . beta carotene w/minerals (OCUVITE) tablet Take 1 tablet by mouth daily.    . cetirizine (ZYRTEC) 10 MG tablet Take 10 mg by mouth daily.    . citalopram (CELEXA) 20 MG tablet Take 1 tablet (20 mg total) by mouth daily. 90 tablet 3  . clonazePAM (KLONOPIN) 1 MG tablet Take 1 tablet (1 mg total) by mouth 2 (two) times daily as needed for anxiety. 60 tablet 5  . cromolyn (OPTICROM) 4 % ophthalmic solution Place 1 drop into both eyes 4 (four) times daily.    Marland Kitchen diltiazem (CARDIZEM CD) 120 MG 24 hr capsule TAKE ONE CAPSULE BY MOUTH ONCE DAILY 30 capsule 5  . ezetimibe (ZETIA) 10 MG tablet Take 1 tablet (10 mg total) by mouth daily. 30 tablet 11  . gabapentin (NEURONTIN) 300 MG capsule TAKE ONE CAPSULE BY MOUTH THREE TIMES DAILY 270 capsule 3  . glucose blood (ONE TOUCH ULTRA TEST) test strip TEST 3 TIMES A DAY AS DIRECTED 100 each 11  . HUMALOG KWIKPEN 200 UNIT/ML SOPN INJECT 24 UNITS SUBCUTANEOUSLY THREE TIMES DAILY BEFORE MEALS 15 mL 6  . HUMULIN N KWIKPEN 100 UNIT/ML Kiwkpen INJECT 120 UNITS SUBCUTANEOUSLY WITH BREAKFAST AND 50 UNITS SUBCUTANEOUSLY WITH SUPPER 60 mL 6  . hydrOXYzine (ATARAX/VISTARIL) 25 MG tablet 1 tab at supper and 1 at bedtime (for insomnia) 60 tablet 11  . isosorbide mononitrate (IMDUR) 60 MG 24 hr tablet Take 1 tablet (60 mg total) by mouth at bedtime. 30 tablet 11  . lisinopril (PRINIVIL,ZESTRIL) 40 MG tablet Take 20 mg by mouth daily.     . metFORMIN (GLUCOPHAGE) 1000 MG tablet TAKE ONE TABLET BY MOUTH TWICE DAILY WITH MEALS 60 tablet 6  . Omega-3 Fatty Acids ([employer] FISH OIL) 1000 MG CAPS Take  2 tablets by mouth 2 (two) times daily. 90 capsule   . ONETOUCH DELICA LANCETS 30Z MISC 1 each by Other route 3 (three) times daily. USE TO TEST 3 TIMES A DAY 100 each 11  . OXYGEN Inhale 2 L/min into the lungs continuous.    . pantoprazole (PROTONIX) 40 MG tablet Take 1 tablet (40 mg total) by mouth 2 (two) times daily. 60 tablet 3  . sucralfate (CARAFATE) 1 GM/10ML suspension Take 10 mLs (1 g total) by mouth 4 (four) times daily. 420 mL 1  . torsemide (DEMADEX) 20 MG tablet Take 1 tablet (20 mg total) by mouth daily. Take extra tab as directed for weight gain. 45 tablet 11   No current facility-administered medications for this visit.    Facility-Administered Medications Ordered in Other Visits  Medication Dose Route Frequency Provider Last Rate Last Dose  . SONAFINE emulsion 1 application  1 application Topical BID Kyung Rudd, MD   1 application at 60/10/93 1219    REVIEW OF SYSTEMS:   Constitutional: Denies fevers, chills or abnormal night sweats   Eyes: Denies blurriness of vision, double vision or watery eyes Ears, nose, mouth, throat, and face: Denies mucositis or sore throat Respiratory: Denies cough, dyspnea or wheezes Cardiovascular: Denies palpitation, chest discomfort or lower extremity swelling Gastrointestinal:  Denies nausea, heartburn or change in bowel habits Skin: Denies abnormal skin rashes Lymphatics: Denies new lymphadenopathy or easy bruising Neurological:Denies numbness, tingling or new weaknesses Behavioral/Psych: Mood is stable, no new changes  All other systems were reviewed with the patient and are negative.  PHYSICAL EXAMINATION: ECOG PERFORMANCE STATUS: 3 - Symptomatic, >50% confined to bed  Vitals:   06/08/16 1022  BP: 133/63  Pulse: 97  Resp: 18  Temp: 98.6 F (37 C)   Filed Weights    06/08/16 1022  Weight: 299 lb (135.6 kg)    GENERAL:alert, no distress and comfortable, morbid obese, sitting in wheelchair with nasal cannula oxygen SKIN: skin color, texture, turgor are normal, no rashes or significant lesions EYES: normal, conjunctiva are pink and non-injected, sclera clear OROPHARYNX:no exudate, no erythema and lips, buccal mucosa, and tongue normal  NECK: supple, thyroid normal size, non-tender, without nodularity LYMPH:  no palpable lymphadenopathy in the cervical, axillary or inguinal LUNGS: (+) Scatter rales on bilateral lung base, no wheezing HEART: regular rate & rhythm and no murmurs and no lower extremity edema ABDOMEN:abdomen soft, non-tender and normal bowel sounds Musculoskeletal:no cyanosis of digits and no clubbing  PSYCH: alert & oriented x 3 with fluent speech NEURO: no focal motor/sensory deficits EXT: Trace edema, he wears compression stocks  LABORATORY DATA:  I have reviewed the data as listed CBC Latest Ref Rng & Units 06/08/2016 03/16/2016 03/15/2016  WBC 4.0 - 10.3 10e3/uL 9.7 9.2 9.0  Hemoglobin 13.0 - 17.1 g/dL 12.0(L) 12.5(L) 13.3  Hematocrit 38.4 - 49.9 % 36.7(L) 41.1 42.1  Platelets 140 - 400 10e3/uL 268 214 245   CMP Latest Ref Rng & Units 06/08/2016 05/07/2016 03/16/2016  Glucose 70 - 140 mg/dl 116 129(H) 96  BUN 7.0 - 26.0 mg/dL 27.3(H) 23 17  Creatinine 0.7 - 1.3 mg/dL 1.1 1.30 1.04  Sodium 136 - 145 mEq/L 136 136 137  Potassium 3.5 - 5.1 mEq/L 4.7 4.6 4.9  Chloride 96 - 112 mEq/L - 99 98(L)  CO2 22 - 29 mEq/L '25 31 31  ' Calcium 8.4 - 10.4 mg/dL 9.0 9.0 9.4  Total Protein 6.4 - 8.3 g/dL 7.4 - -  Total Bilirubin 0.20 -  1.20 mg/dL 0.57 - -  Alkaline Phos 40 - 150 U/L 58 - -  AST 5 - 34 U/L 17 - -  ALT 0 - 55 U/L 15 - -    PATHOLOGY REPORT Diagnosis 05/04/2016 1. Esophagogastric junction, biopsy, ulcerative mass - POORLY DIFFERENTIATED CARCINOMA, SEE COMMENT. 2. Stomach, biopsy - REACTIVE GASTROPATHY. - NEGATIVE FOR HELICOBACTER  PYLORI. - NO INTESTINAL METAPLASIA, DYSPLASIA, OR MALIGNANCY. Microscopic Comment 1. The majority of the specimen consists of gastroesophageal mucosa with reflux changes. There is a small focus of tumor underlying the squamous mucosa. There is lymphovascular invasion. There is no background intestinal metaplasia (Barrett's esophagus). Immunohistochemistry reveals only very focal weak cytokeratin 5/6, negative p63, and negative mucicarmine in the limited remaining tumor. Dr. Saralyn Pilar has reviewed the case. The case was called to Dr. Hilarie Fredrickson on 05/08/2016. 2. A Warthin-Starry stain is performed to determine the possibility of the presence of Helicobacter pylori. The Warthin-Starry stain is negative for organisms of Helicobacter pylori.  RADIOGRAPHIC STUDIES: I have personally reviewed the radiological images as listed and agreed with the findings in the report. Nm Pet Image Initial (pi) Skull Base To Thigh  Result Date: 05/21/2016 CLINICAL DATA:  Initial treatment strategy for esophagogastric junction adenocarcinoma. EXAM: NUCLEAR MEDICINE PET SKULL BASE TO THIGH TECHNIQUE: 13.9 mCi F-18 FDG was injected intravenously. Full-ring PET imaging was performed from the skull base to thigh after the radiotracer. CT data was obtained and used for attenuation correction and anatomic localization. FASTING BLOOD GLUCOSE:  Value: 97 mg/dl COMPARISON:  CT on 05/09/2016 FINDINGS: NECK No hypermetabolic lymph nodes in the neck. CHEST 2.0 cm hypermetabolic lymph node in the middle mediastinum along the right lateral wall of the distal thoracic esophagus, image 92/4. This has an SUV max of 21.3. No other hypermetabolic lymph nodes seen within the thorax. No suspicious pulmonary nodules seen on CT images. ABDOMEN/PELVIS No abnormal hypermetabolic activity within the liver, pancreas, adrenal glands, or spleen. Ill-defined hypermetabolic soft tissue density is seen involving the distal thoracic esophagus and gastric fundus,  which has SUV max of 28.6. This is consistent with known primary carcinoma. Hypermetabolic upper abdominal lymphadenopathy is seen within the gastrohepatic ligament and celiac axis, with largest lymph node measuring 2.0 cm on image 111/4, and SUV max measuring 18.6. Hypermetabolic lymph nodes are seen within the abdominal retroperitoneum in the aortacaval and left paraaortic regions. Largest lymph node in left paraaortic region measures 1.7 cm on image 135/4 with SUV max of 16.8. No hypermetabolic lymph nodes identified within the pelvis. Probable hepatic cirrhosis again noted. Tiny calcified gallstones again seen without evidence of cholecystitis or biliary dilatation. Aortic atherosclerosis noted. Sigmoid colon diverticulosis noted, without evidence of diverticulitis. SKELETON No focal hypermetabolic activity to suggest skeletal metastasis. IMPRESSION: Ill-defined hypermetabolic soft tissue density involving the distal thoracic esophagus and gastric fundus, consistent with known primary carcinoma. 2 cm hypermetabolic mediastinal lymph node adjacent to the right lateral wall of the distal thoracic esophagus, consistent with metastatic disease. Hypermetabolic metastatic lymphadenopathy in the upper abdomen and abdominal retroperitoneum. No evidence of metastatic disease within the neck or pelvis. Incidentally noted aortic atherosclerosis, probable hepatic cirrhosis, cholelithiasis, and diverticulosis. Electronically Signed   By: Earle Gell M.D.   On: 05/21/2016 14:58  EGD 05/04/2016 A large, ulcerating mass with no active bleeding and no stigmata of recent bleeding was found at the gastroesophageal junction extending into the gastric cardia, beginning 40 cm from the incisors. The mass was non-obstructing and partially circumferential (involving one-half of the lumen circumference).  The mass extends approximately 5 cm. IMPRESSION:  -Likely malignant esophageal tumor was found at the gastroesophageal junction  extending into the gastric cardia. Biopsied. - Non-bleeding erosive gastropathy. Biopsied. - Erythematous duodenopathy. - Normal second portion of the duodenum.  ASSESSMENT & PLAN: 70 year old Caucasian male, with multiple comorbidities, including hypertension, diabetes, OSA, coronary artery disease, diastolic CHF, on continuous oxygen, morbid obesity, presented with progressive dysphagia and odynophagia.  1. Cancer of cardio-esophageal junction, poorly differentiated, cTxN2M1, stage IV  -I reviewed his CT scan, EGD, and the biopsy results with patient and his family members in great details. -His biopsy pathology was reviewed in our tumor board 2 days ago, the morphology and weakly positive for CK 5/6, fevers squamous cell carcinoma -His CT scan findings are very concerning for distant metastasis to abdominal lymph nodes.  -I reviewed the PET scan images with patient and his daughter in person today, he has hypermetabolic GE junction tumor, and diffuse adenopathy in mediastinum and upper abdomen. -We discussed the aggressive nature of esophageal cancer, an incurable nature of his disease due to the distant metastasis. The goal of therapy is palliative, to prolong his life and palliate his symptoms. -He has started palliative radiation, plan to complete on August 25. -Due to the palliative nature of treatment goal, and multiple comorbidities and poor performance status, I do not recommend concurrent chemoradiation.  -I will recommend sequential chemotherapy after radiation, if he recovers well. Giving the probable squamous histology, I recommend carboplatin and Taxol.  -I'll plan to see him back in 2 weeks, before he completed radiation, to assess him and prepare him to start chemotherapy.  2. CAD, diastolic CHF, on continuous oxygen -He will continue follow-up with his cardiologist Dr. Radford Pax -His Plavix has been held since the EGD and biopsy, he is on baby aspirin. -Need to watch his fluid  intake during his chemotherapy  3. DM, HTN -We discussed his blood pressure and glucose need to be monitored closely during the chemotherapy, which may affect his blood glucose and blood pressure -He will continue medication, monitor his sugar and blood pressure at home, and follow-up with his primary care physician  4. OSA, morbid obesity -We discussed healthy diet, I encouraged him to to be more physically active, and consider home PT -He will be seen by our dietitian Pamala Hurry today. I strongly encouraged him to follow-up with dietitian during the therapy for nutrition support.  5. Epigastric pain -Likely secondary to his underlying malignancy -He may get worse temporary with radiation -He will continue Carafate   Plan -PET scan and lab reviewed  -I'll see him back back in 2 weeks with his last dose of radiation, prepare him to start systemic chemo afterwards   All questions were answered. The patient knows to call the clinic with any problems, questions or concerns. I spent 25 minutes counseling the patient face to face. The total time spent in the appointment was 30 minutes and more than 50% was on counseling.     Truitt Merle, MD 06/08/2016 10:45 PM

## 2016-06-08 NOTE — Telephone Encounter (Signed)
Pt will p/u new sched in  radiation

## 2016-06-08 NOTE — Progress Notes (Signed)
Mr. Gadd has received 5 fractions to his esophagus.  Skin to with normal color.  Denies any swallowing, pain or sore throat coughing.  Appetite is fair.  Drinking protein shakes once a day.  Having dry mouth.  Had a nutritional consult and has upper and lower dentures.   Denies fatigue.  Oxygen @ 2 L/min at all times.  Education today Sonafine given. BP 133/63   Pulse 97   Temp 98.6 F (37 C) (Oral)   Resp 18   Ht 5\' 8"  (1.727 m)   Wt 299 lb (135.6 kg)   SpO2 97%   BMI 45.46 kg/m   Wt Readings from Last 3 Encounters:  06/08/16 299 lb (135.6 kg)  06/08/16 299 lb (135.6 kg)  05/18/16 (!) 309 lb 8 oz (140.4 kg)

## 2016-06-08 NOTE — Progress Notes (Signed)
Department of Radiation Oncology  Phone:  [phone] Fax:        [phone]  Weekly Treatment Note    Name: [name] Date: 06/10/2016 MRN: [id number] DOB: Jun 03, 1946   Diagnosis:     ICD-9-CM ICD-10-CM   1. 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 aspirin 81 MG tablet Take 81 mg by mouth daily.      Marland Kitchen atorvastatin (LIPITOR) 80 MG tablet Take 1 tablet (80 mg total) by mouth daily. 90 tablet 3  . beta carotene w/minerals (OCUVITE) tablet Take 1 tablet by mouth daily.    . cetirizine (ZYRTEC) 10 MG tablet Take 10 mg by mouth daily.    . citalopram (CELEXA) 20 MG tablet Take 1 tablet (20 mg total) by mouth daily. 90 tablet 3  . clonazePAM (KLONOPIN) 1 MG tablet Take 1 tablet (1 mg total) by mouth 2 (two) times daily as needed for anxiety. 60 tablet 5  . cromolyn (OPTICROM) 4 % ophthalmic solution Place 1 drop into both eyes 4 (four) times daily.    Marland Kitchen diltiazem (CARDIZEM CD) 120 MG 24 hr capsule TAKE ONE CAPSULE BY MOUTH ONCE DAILY 30 capsule 5  . ezetimibe (ZETIA) 10 MG tablet Take 1 tablet (10 mg total) by mouth daily. 30 tablet 11  . gabapentin (NEURONTIN) 300 MG capsule TAKE ONE CAPSULE BY MOUTH THREE TIMES DAILY 270 capsule 3  . glucose blood (ONE TOUCH ULTRA TEST) test strip TEST 3 TIMES A DAY AS DIRECTED 100 each 11  . HUMALOG KWIKPEN 200 UNIT/ML SOPN INJECT 24 UNITS SUBCUTANEOUSLY THREE TIMES DAILY BEFORE MEALS 15 mL 6  . HUMULIN N KWIKPEN 100 UNIT/ML Kiwkpen INJECT 120 UNITS SUBCUTANEOUSLY WITH BREAKFAST AND 50 UNITS SUBCUTANEOUSLY WITH SUPPER 60 mL 6  . hydrOXYzine (ATARAX/VISTARIL) 25 MG tablet 1 tab at supper and 1 at bedtime (for insomnia) 60 tablet 11  . isosorbide mononitrate (IMDUR) 60 MG 24 hr tablet Take 1 tablet (60 mg total) by mouth at bedtime. 30 tablet 11  .  lisinopril (PRINIVIL,ZESTRIL) 40 MG tablet Take 20 mg by mouth daily.    . metFORMIN (GLUCOPHAGE) 1000 MG tablet TAKE ONE TABLET BY MOUTH TWICE DAILY WITH MEALS 60 tablet 6  . Omega-3 Fatty Acids ([employer] FISH OIL) 1000 MG CAPS Take 2 tablets by mouth 2 (two) times daily. 90 capsule   . ONETOUCH DELICA LANCETS 99991111 MISC 1 each by Other route 3 (three) times daily. USE TO TEST 3 TIMES A DAY 100 each 11  . OXYGEN Inhale 2 L/min into the lungs continuous.    . pantoprazole (PROTONIX) 40 MG tablet Take 1 tablet (40 mg total) by mouth 2 (two) times daily. 60 tablet 3  . sucralfate (CARAFATE) 1 GM/10ML suspension Take 10 mLs (1 g total) by mouth 4 (four) times daily. 420 mL 1  . torsemide (DEMADEX) 20 MG tablet Take 1 tablet (20 mg total) by mouth daily. Take extra tab as directed for weight gain. 45 tablet 11   No current facility-administered medications for this encounter.      ALLERGIES: Review of patient's allergies indicates no known allergies.   LABORATORY DATA:  Lab Results  Component Value Date   WBC 9.7 06/08/2016   HGB 12.0 (L) 06/08/2016   HCT 36.7 (L) 06/08/2016   MCV 83.2 06/08/2016   PLT 268  06/08/2016   Lab Results  Component Value Date   NA 136 06/08/2016   K 4.7 06/08/2016   CL 99 05/07/2016   CO2 25 06/08/2016   Lab Results  Component Value Date   ALT 15 06/08/2016   AST 17 06/08/2016   ALKPHOS 58 06/08/2016   BILITOT 0.57 06/08/2016     NARRATIVE: [name] was seen today for weekly treatment management. The chart was checked and the patient's films were reviewed.  Mr. Sibbald has received 5 fractions to his esophagus. Denies any swallowing, pain, cough, or sore throat. Appetite is fair. Drinking protein shakes once a day. Reports dry mouth. Had a nutritional consult and has upper and lower dentures. Denies fatigue. Oxygen 2 liters by nasal cannula at all time.   PHYSICAL EXAMINATION: height is 5\' 8"  (1.727 m) and weight is 299 lb (135.6 kg). His oral  temperature is 98.6 F (37 C). His blood pressure is 133/63 and his pulse is 97. His respiration is 18 and oxygen saturation is 97%.      In wheelchair with nasal cannula in place. Alert, no acute distress.   ASSESSMENT: The patient is doing satisfactorily with treatment.  PLAN: We will continue with the patient's radiation treatment as planned. The patient was given Sonafine cream today.    This document serves as a record of services personally performed by Kyung Rudd, MD. It was created on his behalf by Arlyce Harman, a trained medical scribe. The creation of this record is based on the scribe's personal observations and the provider's statements to them. This document has been checked and approved by the attending provider.  ------------------------------------------------  Jodelle Gross, MD, PhD

## 2016-06-11 ENCOUNTER — Encounter: Payer: Self-pay | Admitting: Hematology

## 2016-06-11 ENCOUNTER — Ambulatory Visit
Admission: RE | Admit: 2016-06-11 | Discharge: 2016-06-11 | Disposition: A | Discharge status: Home / Self Care | Payer: Medicare Other | Source: Ambulatory Visit | Attending: Radiation Oncology | Admitting: Radiation Oncology

## 2016-06-11 DIAGNOSIS — Z510 Encounter for antineoplastic radiation therapy: Secondary | ICD-10-CM | POA: Diagnosis not present

## 2016-06-11 DIAGNOSIS — C160 Malignant neoplasm of cardia: Secondary | ICD-10-CM | POA: Diagnosis not present

## 2016-06-11 NOTE — Progress Notes (Unsigned)
Received voicemail from patient stating he had been trying to reach me for 3 weeks and left several messages. Apologized for inconvenience and advised patient that this was the first message I had received from him and called back immediately. He also states that he had the RN and NT to reach out to me to contact him and was told that they did. I have no messages regarding this patient. Patient wanted to advise that his insurance pays 80% and he does not have the other 20% to pay for his services. Patient is doing Radiation and not chemo at this time. I asked patient if he had met his deductible/OOP for insurance.Patient states he does not know. He states at the pharmacy he no longer has to pay a co-pay. I asked if he had received any bills from [place]. He states no. I apologized again and advised patient that he would need to contact Ailene Ravel whom is the Estate manager/land agent for Radiation who may further assist him with any balances or assistance regarding Radiation.

## 2016-06-12 ENCOUNTER — Encounter: Payer: Self-pay | Admitting: *Deleted

## 2016-06-12 ENCOUNTER — Ambulatory Visit
Admission: RE | Admit: 2016-06-12 | Discharge: 2016-06-12 | Disposition: A | Discharge status: Home / Self Care | Payer: Medicare Other | Source: Ambulatory Visit | Attending: Radiation Oncology | Admitting: Radiation Oncology

## 2016-06-12 DIAGNOSIS — Z510 Encounter for antineoplastic radiation therapy: Secondary | ICD-10-CM | POA: Diagnosis not present

## 2016-06-12 DIAGNOSIS — C160 Malignant neoplasm of cardia: Secondary | ICD-10-CM | POA: Diagnosis not present

## 2016-06-13 ENCOUNTER — Ambulatory Visit
Admission: RE | Admit: 2016-06-13 | Discharge: 2016-06-13 | Disposition: A | Discharge status: Home / Self Care | Payer: Medicare Other | Source: Ambulatory Visit | Attending: Radiation Oncology | Admitting: Radiation Oncology

## 2016-06-13 DIAGNOSIS — Z510 Encounter for antineoplastic radiation therapy: Secondary | ICD-10-CM | POA: Diagnosis not present

## 2016-06-13 DIAGNOSIS — C160 Malignant neoplasm of cardia: Secondary | ICD-10-CM | POA: Diagnosis not present

## 2016-06-14 ENCOUNTER — Encounter: Payer: Self-pay | Admitting: Family Medicine

## 2016-06-14 ENCOUNTER — Ambulatory Visit
Admission: RE | Admit: 2016-06-14 | Discharge: 2016-06-14 | Disposition: A | Discharge status: Home / Self Care | Payer: Medicare Other | Source: Ambulatory Visit | Attending: Radiation Oncology | Admitting: Radiation Oncology

## 2016-06-14 DIAGNOSIS — C160 Malignant neoplasm of cardia: Secondary | ICD-10-CM | POA: Diagnosis not present

## 2016-06-14 DIAGNOSIS — Z510 Encounter for antineoplastic radiation therapy: Secondary | ICD-10-CM | POA: Diagnosis not present

## 2016-06-15 ENCOUNTER — Ambulatory Visit
Admission: RE | Admit: 2016-06-15 | Discharge: 2016-06-15 | Disposition: A | Discharge status: Home / Self Care | Payer: Medicare Other | Source: Ambulatory Visit | Attending: Radiation Oncology | Admitting: Radiation Oncology

## 2016-06-15 ENCOUNTER — Encounter: Payer: Self-pay | Admitting: Radiation Oncology

## 2016-06-15 VITALS — BP 125/70 | HR 89 | Temp 97.8°F | Resp 20

## 2016-06-15 DIAGNOSIS — Z510 Encounter for antineoplastic radiation therapy: Secondary | ICD-10-CM | POA: Diagnosis not present

## 2016-06-15 DIAGNOSIS — C160 Malignant neoplasm of cardia: Secondary | ICD-10-CM | POA: Diagnosis not present

## 2016-06-15 NOTE — Progress Notes (Signed)
Department of Radiation Oncology  Phone:  [phone] Fax:        [phone]  Weekly Treatment Note    Name: [name] Date: 06/15/2016 MRN: [id number] DOB: 02-03-1946   Diagnosis:     ICD-9-CM ICD-10-CM   1. 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 aspirin 81 MG tablet Take 81 mg by mouth daily.      Marland Kitchen atorvastatin (LIPITOR) 80 MG tablet Take 1 tablet (80 mg total) by mouth daily. 90 tablet 3  . beta carotene w/minerals (OCUVITE) tablet Take 1 tablet by mouth daily.    . cetirizine (ZYRTEC) 10 MG tablet Take 10 mg by mouth daily.    . citalopram (CELEXA) 20 MG tablet Take 1 tablet (20 mg total) by mouth daily. 90 tablet 3  . clonazePAM (KLONOPIN) 1 MG tablet Take 1 tablet (1 mg total) by mouth 2 (two) times daily as needed for anxiety. 60 tablet 5  . cromolyn (OPTICROM) 4 % ophthalmic solution Place 1 drop into both eyes 4 (four) times daily.    Marland Kitchen diltiazem (CARDIZEM CD) 120 MG 24 hr capsule TAKE ONE CAPSULE BY MOUTH ONCE DAILY 30 capsule 5  . ezetimibe (ZETIA) 10 MG tablet Take 1 tablet (10 mg total) by mouth daily. 30 tablet 11  . gabapentin (NEURONTIN) 300 MG capsule TAKE ONE CAPSULE BY MOUTH THREE TIMES DAILY 270 capsule 3  . glucose blood (ONE TOUCH ULTRA TEST) test strip TEST 3 TIMES A DAY AS DIRECTED 100 each 11  . HUMALOG KWIKPEN 200 UNIT/ML SOPN INJECT 24 UNITS SUBCUTANEOUSLY THREE TIMES DAILY BEFORE MEALS 15 mL 6  . HUMULIN N KWIKPEN 100 UNIT/ML Kiwkpen INJECT 120 UNITS SUBCUTANEOUSLY WITH BREAKFAST AND 50 UNITS SUBCUTANEOUSLY WITH SUPPER 60 mL 6  . hydrOXYzine (ATARAX/VISTARIL) 25 MG tablet 1 tab at supper and 1 at bedtime (for insomnia) 60 tablet 11  . isosorbide mononitrate (IMDUR) 60 MG 24 hr tablet Take 1 tablet (60 mg total) by mouth at bedtime. 30 tablet 11  . lisinopril (PRINIVIL,ZESTRIL) 40 MG tablet Take 20 mg by  mouth daily.    . metFORMIN (GLUCOPHAGE) 1000 MG tablet TAKE ONE TABLET BY MOUTH TWICE DAILY WITH MEALS 60 tablet 6  . Omega-3 Fatty Acids ([employer] FISH OIL) 1000 MG CAPS Take 2 tablets by mouth 2 (two) times daily. 90 capsule   . ONETOUCH DELICA LANCETS 99991111 MISC 1 each by Other route 3 (three) times daily. USE TO TEST 3 TIMES A DAY 100 each 11  . OXYGEN Inhale 2 L/min into the lungs continuous.    . pantoprazole (PROTONIX) 40 MG tablet Take 1 tablet (40 mg total) by mouth 2 (two) times daily. 60 tablet 3  . sucralfate (CARAFATE) 1 GM/10ML suspension Take 10 mLs (1 g total) by mouth 4 (four) times daily. 420 mL 1  . torsemide (DEMADEX) 20 MG tablet Take 1 tablet (20 mg total) by mouth daily. Take extra tab as directed for weight gain. 45 tablet 11   No current facility-administered medications for this encounter.      ALLERGIES: Review of patient's allergies indicates no known allergies.   LABORATORY DATA:  Lab Results  Component Value Date   WBC 9.7 06/08/2016   HGB 12.0 (L) 06/08/2016   HCT 36.7 (L) 06/08/2016   MCV 83.2 06/08/2016   PLT 268 06/08/2016   Lab  Results  Component Value Date   NA 136 06/08/2016   K 4.7 06/08/2016   CL 99 05/07/2016   CO2 25 06/08/2016   Lab Results  Component Value Date   ALT 15 06/08/2016   AST 17 06/08/2016   ALKPHOS 58 06/08/2016   BILITOT 0.57 06/08/2016     NARRATIVE: [name] was seen today for weekly treatment management. The chart was checked and the patient's films were reviewed.  Weekly rad txs esophagus 10/15  Completed, taking carafte  4x day, when first eating 3-4 bites hurts to go down, but after that he can eat easily,  Very fatigue, no nausea, no pain, skin check no chanages, not using the sonafine as yet  2:13 PM BP 125/70 (BP Location: Left Arm, Patient Position: Sitting, Cuff Size: Normal)   Pulse 89   Temp 97.8 F (36.6 C) (Oral)   Resp 20   SpO2 99% Comment: 2 liter n/c  Wt Readings from Last 3 Encounters:    06/08/16 299 lb (135.6 kg)  06/08/16 299 lb (135.6 kg)  05/18/16 (!) 309 lb 8 oz (140.4 kg)    PHYSICAL EXAMINATION: oral temperature is 97.8 F (36.6 C). His blood pressure is 125/70 and his pulse is 89. His respiration is 20 and oxygen saturation is 99%.        ASSESSMENT: The patient is doing satisfactorily with treatment.  The patient still has some pain initially with swallowing. No significant change in this. Hopefully this will begin to resolve as he completes his radiation treatment next week.  PLAN: We will continue with the patient's radiation treatment as planned.

## 2016-06-15 NOTE — Progress Notes (Signed)
  Radiation Oncology         [phone] ________________________________  Name: MURRIEL BECKEN MRN: [id number]  Date: 05/28/2016  DOB: [birth year]  SIMULATION AND TREATMENT PLANNING NOTE  DIAGNOSIS:     ICD-9-CM ICD-10-CM   1. 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  Identity was confirmed.  All relevant records and images related to the planned course of therapy were reviewed.   Written consent to proceed with treatment was confirmed which was freely given after reviewing the details related to the planned course of therapy had been reviewed with the patient.  Then, the patient was set-up in a stable reproducible  supine position for radiation therapy.  CT images were obtained.  Surface markings were placed.    Medically necessary complex treatment device(s) for immobilization:  Customized Accuform device.   The CT images were loaded into the planning software.  Then the target and avoidance structures were contoured.  Treatment planning then occurred.   I have requested : 3D Simulation  I have requested a DVH of the following structures: Tumor volume, lungs, heart, spinal cord. This will be carried out on our tomotherapy unit corresponding to a 3-D conformal treatment. The number of treatment fields will be determined by the sinogram generated for the patient's treatment area did.   The patient will undergo daily image guidance to ensure accurate localization of the target, and adequate minimize dose to the normal surrounding structures in close proximity to the target.   PLAN:  The patient will receive 37.5 Gy in 15 fractions.  ________________________________   Jodelle Gross, MD, PhD

## 2016-06-15 NOTE — Progress Notes (Signed)
Weekly rad txs esophagus 10/15  Completed, taking carafte  4x day, when first eating 3-4 bites hurts to go down, but after that he can eat easily,  Very fatigue, no nausea, no pain, skin check no chanages, not using the sonafine as yet  1:48 PM BP 125/70 (BP Location: Left Arm, Patient Position: Sitting, Cuff Size: Normal)   Pulse 89   Temp 97.8 F (36.6 C) (Oral)   Resp 20   SpO2 99% Comment: 2 liter n/c  Wt Readings from Last 3 Encounters:  06/08/16 299 lb (135.6 kg)  06/08/16 299 lb (135.6 kg)  05/18/16 (!) 309 lb 8 oz (140.4 kg)

## 2016-06-17 ENCOUNTER — Encounter: Payer: Self-pay | Admitting: Family Medicine

## 2016-06-18 ENCOUNTER — Ambulatory Visit
Admission: RE | Admit: 2016-06-18 | Discharge: 2016-06-18 | Disposition: A | Discharge status: Home / Self Care | Payer: Medicare Other | Source: Ambulatory Visit | Attending: Radiation Oncology | Admitting: Radiation Oncology

## 2016-06-18 DIAGNOSIS — Z510 Encounter for antineoplastic radiation therapy: Secondary | ICD-10-CM | POA: Diagnosis not present

## 2016-06-18 DIAGNOSIS — C160 Malignant neoplasm of cardia: Secondary | ICD-10-CM | POA: Diagnosis not present

## 2016-06-19 ENCOUNTER — Ambulatory Visit
Admission: RE | Admit: 2016-06-19 | Discharge: 2016-06-19 | Disposition: A | Discharge status: Home / Self Care | Payer: Medicare Other | Source: Ambulatory Visit | Attending: Radiation Oncology | Admitting: Radiation Oncology

## 2016-06-19 DIAGNOSIS — C160 Malignant neoplasm of cardia: Secondary | ICD-10-CM | POA: Diagnosis not present

## 2016-06-19 DIAGNOSIS — Z510 Encounter for antineoplastic radiation therapy: Secondary | ICD-10-CM | POA: Diagnosis not present

## 2016-06-20 ENCOUNTER — Ambulatory Visit
Admission: RE | Admit: 2016-06-20 | Discharge: 2016-06-20 | Disposition: A | Discharge status: Home / Self Care | Payer: Medicare Other | Source: Ambulatory Visit | Attending: Radiation Oncology | Admitting: Radiation Oncology

## 2016-06-20 DIAGNOSIS — Z510 Encounter for antineoplastic radiation therapy: Secondary | ICD-10-CM | POA: Diagnosis not present

## 2016-06-20 DIAGNOSIS — C160 Malignant neoplasm of cardia: Secondary | ICD-10-CM | POA: Diagnosis not present

## 2016-06-21 ENCOUNTER — Ambulatory Visit
Admission: RE | Admit: 2016-06-21 | Discharge: 2016-06-21 | Disposition: A | Discharge status: Home / Self Care | Payer: Medicare Other | Source: Ambulatory Visit | Attending: Radiation Oncology | Admitting: Radiation Oncology

## 2016-06-21 DIAGNOSIS — C160 Malignant neoplasm of cardia: Secondary | ICD-10-CM | POA: Diagnosis not present

## 2016-06-21 DIAGNOSIS — Z510 Encounter for antineoplastic radiation therapy: Secondary | ICD-10-CM | POA: Diagnosis not present

## 2016-06-22 ENCOUNTER — Ambulatory Visit
Admission: RE | Admit: 2016-06-22 | Discharge: 2016-06-22 | Disposition: A | Discharge status: Home / Self Care | Payer: Medicare Other | Source: Ambulatory Visit | Attending: Radiation Oncology | Admitting: Radiation Oncology

## 2016-06-22 ENCOUNTER — Encounter: Payer: Self-pay | Admitting: Radiation Oncology

## 2016-06-22 VITALS — BP 107/66 | HR 101 | Temp 98.7°F | Ht 68.0 in | Wt 300.4 lb

## 2016-06-22 DIAGNOSIS — C160 Malignant neoplasm of cardia: Secondary | ICD-10-CM

## 2016-06-22 DIAGNOSIS — Z510 Encounter for antineoplastic radiation therapy: Secondary | ICD-10-CM | POA: Diagnosis not present

## 2016-06-22 NOTE — Telephone Encounter (Signed)
Patient states "there is nothing wrong with his eyes and he states he hasn't seen Dr Talbert Forest in months"  I offered patient appointment and he said he is too busy, he has cancer and he can't schedule at this time.

## 2016-06-22 NOTE — Progress Notes (Signed)
[name] presents for his last fraction of radiation to his Esophagus. He denies pain at this time, but does tell me he experiences severe pain during the first few bites of eating food and liquids. He is using the carafate, but doesn't believe it has helped him much. He feels like does have a decreased appetite and is eating less food. He also is not drinking as much water as he usually does. He denies nausea or vomiting and tells me he is having normal bowel movements. The skin to his radiation site is normal appearing, and he has never used the cream provided.   BP 107/66   Pulse (!) 101   Temp 98.7 F (37.1 C)   Ht 5\' 8"  (1.727 m)   Wt (!) 300 lb 6.4 oz (136.3 kg)   SpO2 97% Comment: 2 Liters  BMI 45.68 kg/m    Wt Readings from Last 3 Encounters:  06/22/16 (!) 300 lb 6.4 oz (136.3 kg)  06/08/16 299 lb (135.6 kg)  06/08/16 299 lb (135.6 kg)

## 2016-06-22 NOTE — Telephone Encounter (Signed)
Noted  

## 2016-06-22 NOTE — Telephone Encounter (Signed)
Pls call pt and ask him to make appt to come in for f/u at his convenience (30 min).--thx

## 2016-06-22 NOTE — Telephone Encounter (Signed)
Patient's daughter called back, stating she talked to Dr Merry Proud nurse and Dr. Janelle Floor office called them stating that patient needs to be evaluated by Neurology.  Unsure of why patient's daughter called back because pt is still declining to go to doctor.  States he feels fine.

## 2016-06-22 NOTE — Telephone Encounter (Signed)
Left message for pt to call back  °

## 2016-06-22 NOTE — Progress Notes (Signed)
Department of Radiation Oncology  Phone:  [phone] Fax:        [phone]  Weekly Treatment Note    Name: [name] Date: 06/24/2016 MRN: [id number] DOB: 1946-09-30   Diagnosis:     ICD-9-CM ICD-10-CM   1. 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 aspirin 81 MG tablet Take 81 mg by mouth daily.      Marland Kitchen atorvastatin (LIPITOR) 80 MG tablet Take 1 tablet (80 mg total) by mouth daily. 90 tablet 3  . beta carotene w/minerals (OCUVITE) tablet Take 1 tablet by mouth daily.    . cetirizine (ZYRTEC) 10 MG tablet Take 10 mg by mouth daily.    . citalopram (CELEXA) 20 MG tablet Take 1 tablet (20 mg total) by mouth daily. 90 tablet 3  . clonazePAM (KLONOPIN) 1 MG tablet Take 1 tablet (1 mg total) by mouth 2 (two) times daily as needed for anxiety. 60 tablet 5  . cromolyn (OPTICROM) 4 % ophthalmic solution Place 1 drop into both eyes 4 (four) times daily.    Marland Kitchen diltiazem (CARDIZEM CD) 120 MG 24 hr capsule TAKE ONE CAPSULE BY MOUTH ONCE DAILY 30 capsule 5  . ezetimibe (ZETIA) 10 MG tablet Take 1 tablet (10 mg total) by mouth daily. 30 tablet 11  . gabapentin (NEURONTIN) 300 MG capsule TAKE ONE CAPSULE BY MOUTH THREE TIMES DAILY 270 capsule 3  . glucose blood (ONE TOUCH ULTRA TEST) test strip TEST 3 TIMES A DAY AS DIRECTED 100 each 11  . HUMALOG KWIKPEN 200 UNIT/ML SOPN INJECT 24 UNITS SUBCUTANEOUSLY THREE TIMES DAILY BEFORE MEALS 15 mL 6  . HUMULIN N KWIKPEN 100 UNIT/ML Kiwkpen INJECT 120 UNITS SUBCUTANEOUSLY WITH BREAKFAST AND 50 UNITS SUBCUTANEOUSLY WITH SUPPER 60 mL 6  . hydrOXYzine (ATARAX/VISTARIL) 25 MG tablet 1 tab at supper and 1 at bedtime (for insomnia) 60 tablet 11  . isosorbide mononitrate (IMDUR) 60 MG 24 hr tablet Take 1 tablet (60 mg total) by mouth at bedtime. 30 tablet 11    . lisinopril (PRINIVIL,ZESTRIL) 40 MG tablet Take 20 mg by mouth daily.    . metFORMIN (GLUCOPHAGE) 1000 MG tablet TAKE ONE TABLET BY MOUTH TWICE DAILY WITH MEALS 60 tablet 6  . Omega-3 Fatty Acids ([employer] FISH OIL) 1000 MG CAPS Take 2 tablets by mouth 2 (two) times daily. 90 capsule   . ONETOUCH DELICA LANCETS 99991111 MISC 1 each by Other route 3 (three) times daily. USE TO TEST 3 TIMES A DAY 100 each 11  . OXYGEN Inhale 2 L/min into the lungs continuous.    . pantoprazole (PROTONIX) 40 MG tablet Take 1 tablet (40 mg total) by mouth 2 (two) times daily. 60 tablet 3  . sucralfate (CARAFATE) 1 GM/10ML suspension Take 10 mLs (1 g total) by mouth 4 (four) times daily. 420 mL 1  . torsemide (DEMADEX) 20 MG tablet Take 1 tablet (20 mg total) by mouth daily. Take extra tab as directed for weight gain. 45 tablet 11   No current facility-administered medications for this encounter.      ALLERGIES: Review of patient's allergies indicates no known allergies.   LABORATORY DATA:  Lab Results  Component Value Date   WBC 9.7 06/08/2016   HGB 12.0 (L) 06/08/2016   HCT 36.7 (L) 06/08/2016  MCV 83.2 06/08/2016   PLT 268 06/08/2016   Lab Results  Component Value Date   NA 136 06/08/2016   K 4.7 06/08/2016   CL 99 05/07/2016   CO2 25 06/08/2016   Lab Results  Component Value Date   ALT 15 06/08/2016   AST 17 06/08/2016   ALKPHOS 58 06/08/2016   BILITOT 0.57 06/08/2016     NARRATIVE: [name] was seen today for weekly treatment management. The chart was checked and the patient's films were reviewed.  [name] presents for his last fraction of radiation to his Esophagus. He denies pain at this time, but does state he has severe pain during the first few bites of eating food and drinking liquids. He is using the carafate, but doesn't believe it has helped him much. He feels like does have a decreased appetite and is eating less food. He also is not drinking as much water as he usually  does. He denies nausea or vomiting and has normal bowel movements. The skin to his radiation site is normal appearing and he has never used the cream provided.   8:06 PM BP 107/66   Pulse (!) 101   Temp 98.7 F (37.1 C)   Ht 5\' 8"  (1.727 m)   Wt (!) 300 lb 6.4 oz (136.3 kg)   SpO2 97% Comment: 2 Liters  BMI 45.68 kg/m   Wt Readings from Last 3 Encounters:  06/22/16 (!) 300 lb 6.4 oz (136.3 kg)  06/08/16 299 lb (135.6 kg)  06/08/16 299 lb (135.6 kg)    PHYSICAL EXAMINATION: height is 5\' 8"  (1.727 m) and weight is 300 lb 6.4 oz (136.3 kg) (abnormal). His temperature is 98.7 F (37.1 C). His blood pressure is 107/66 and his pulse is 101 (abnormal). His oxygen saturation is 97%.   Oxygen via nasal cannula. In a wheelchair.  ASSESSMENT: The patient has done satisfactorily with treatment. The patient's swallowing symptoms should improve since treatment is complete.  PLAN: The patient has completed treatment and will follow up in the clinic in a month. He is scheduled to see med onc next Monday.  This document serves as a record of services personally performed by Kyung Rudd, MD. It was created on his behalf by Darcus Austin, a trained medical scribe. The creation of this record is based on the scribe's personal observations and the provider's statements to them. This document has been checked and approved by the attending provider.

## 2016-06-25 ENCOUNTER — Other Ambulatory Visit (HOSPITAL_BASED_OUTPATIENT_CLINIC_OR_DEPARTMENT_OTHER): Payer: Medicare Other

## 2016-06-25 ENCOUNTER — Ambulatory Visit (HOSPITAL_BASED_OUTPATIENT_CLINIC_OR_DEPARTMENT_OTHER): Payer: Medicare Other | Admitting: Hematology

## 2016-06-25 ENCOUNTER — Telehealth: Payer: Self-pay | Admitting: Hematology

## 2016-06-25 ENCOUNTER — Encounter: Payer: Self-pay | Admitting: *Deleted

## 2016-06-25 VITALS — BP 109/59 | HR 90 | Temp 98.2°F | Resp 17 | Ht 68.0 in | Wt 297.9 lb

## 2016-06-25 DIAGNOSIS — C160 Malignant neoplasm of cardia: Secondary | ICD-10-CM

## 2016-06-25 DIAGNOSIS — I5032 Chronic diastolic (congestive) heart failure: Secondary | ICD-10-CM | POA: Diagnosis not present

## 2016-06-25 DIAGNOSIS — E119 Type 2 diabetes mellitus without complications: Secondary | ICD-10-CM

## 2016-06-25 DIAGNOSIS — I10 Essential (primary) hypertension: Secondary | ICD-10-CM

## 2016-06-25 DIAGNOSIS — J438 Other emphysema: Secondary | ICD-10-CM

## 2016-06-25 LAB — COMPREHENSIVE METABOLIC PANEL
ALT: 19 U/L (ref 0–55)
ANION GAP: 10 meq/L (ref 3–11)
AST: 22 U/L (ref 5–34)
Albumin: 3.2 g/dL — ABNORMAL LOW (ref 3.5–5.0)
Alkaline Phosphatase: 51 U/L (ref 40–150)
BUN: 14.4 mg/dL (ref 7.0–26.0)
CO2: 30 meq/L — AB (ref 22–29)
Calcium: 9.3 mg/dL (ref 8.4–10.4)
Chloride: 99 mEq/L (ref 98–109)
Creatinine: 0.9 mg/dL (ref 0.7–1.3)
EGFR: 86 mL/min/{1.73_m2} — ABNORMAL LOW (ref >90)
GLUCOSE: 81 mg/dL (ref 70–140)
Potassium: 4.6 mEq/L (ref 3.5–5.1)
Sodium: 139 mEq/L (ref 136–145)
TOTAL PROTEIN: 7.3 g/dL (ref 6.4–8.3)
Total Bilirubin: 0.48 mg/dL (ref 0.20–1.20)

## 2016-06-25 LAB — CBC WITH DIFFERENTIAL/PLATELET
BASO%: 0.3 % (ref 0.0–2.0)
Basophils Absolute: 0 10*3/uL (ref 0.0–0.1)
EOS%: 4.8 % (ref 0.0–7.0)
Eosinophils Absolute: 0.3 10*3/uL (ref 0.0–0.5)
HCT: 39.1 % (ref 38.4–49.9)
HGB: 12.6 g/dL — ABNORMAL LOW (ref 13.0–17.1)
LYMPH#: 0.6 10*3/uL — AB (ref 0.9–3.3)
LYMPH%: 9.6 % — AB (ref 14.0–49.0)
MCH: 27.9 pg (ref 27.2–33.4)
MCHC: 32.2 g/dL (ref 32.0–36.0)
MCV: 86.5 fL (ref 79.3–98.0)
MONO#: 0.9 10*3/uL (ref 0.1–0.9)
MONO%: 13.8 % (ref 0.0–14.0)
NEUT%: 71.5 % (ref 39.0–75.0)
NEUTROS ABS: 4.5 10*3/uL (ref 1.5–6.5)
PLATELETS: 132 10*3/uL — AB (ref 140–400)
RBC: 4.52 10*6/uL (ref 4.20–5.82)
RDW: 15.5 % — ABNORMAL HIGH (ref 11.0–14.6)
WBC: 6.2 10*3/uL (ref 4.0–10.3)

## 2016-06-25 MED ORDER — TRAMADOL HCL 50 MG PO TABS
50.0000 mg | ORAL_TABLET | Freq: Three times a day (TID) | ORAL | 1 refills | Status: DC | PRN
Start: 2016-06-25 — End: 2016-07-06

## 2016-06-25 NOTE — Progress Notes (Signed)
[place]  [phone] [phone]  Clinic Follow up Note   Patient Care Team: Tammi Sou, MD as PCP - General (Family Medicine) Ulyses Southward, MD as Consulting Physician (Allergy and Immunology) Calton Dach, MD as Consulting Physician (Optometry) Sueanne Margarita, MD as Consulting Physician (Cardiology) Jerene Bears, MD as Consulting Physician (Gastroenterology) Latanya Maudlin, MD as Consulting Physician (Orthopedic Surgery) Jodi Marble, MD as Consulting Physician (Otolaryngology) Truitt Merle, MD as Consulting Physician (Hematology) Kyung Rudd, MD as Consulting Physician (Radiation Oncology) 06/25/2016    CHIEF COMPLAINTS:  Follow up GE junction cancer  Oncology History   Cancer of cardio-esophageal junction [facility])   Staging form: Stomach, AJCC 7th Edition     Clinical stage from 05/04/2016: Stage IV ([place], N2, M1) - Signed by Truitt Merle, MD on 05/18/2016        Cancer of cardio-esophageal junction ([place])   05/04/2016 Initial Diagnosis    Cancer of cardio-esophageal junction ([place])      05/04/2016 Procedure    EGD showed a large ulcerating mass with no active bleeding at the gastroesophageal junction extending into the gastric cardia. The mass was not obstructing and partially circumferential, extends approximately 5 cm. Nonbleeding erosive gastropathy.      05/04/2016 Initial Biopsy    Esophageal gastric junction biopsy showed a poorly differentiated carcinoma underlying the squamous mucosa. There is lymphovascular invasion. No Intestinal metaplasia. IHC weakly positive CK5/6, p63 (-), favor squamous.        05/09/2016 Imaging    CT CAP w contrast showed mild wall thickening involving the distal esophagus and proximal stomach compatible with known cancer, enlarged mediastinal and upper abdominal lymph nodes are highly suspicious for metastatic adenopathy, propable liver cirrhosis.      06/04/2016 - 06/22/2016 Radiation Therapy   palliative radiation to esophageal cancer        HISTORY OF PRESENTING ILLNESS:  [name] 70 y.o. male is here because of His reason that diagnosed GE junction carcinoma. He is a comment of by his wife and daughter to our multidisciplinary chart clinic today.  He has been having progressive dysphagia and odynophagia for 2 month, he has been eating soft diet only in the past one week. He has pain in the mid chest and epigastric area only when he eats, with burning sensation, no nausea, abdominal bloating, or other discomfort. His appetite has remained well, no weight loss,   He has multiple medical problems, especially heart failure, he has been on oxygen continuously for 5-6 years. He is morbidly obese, has diabetes, hypertension, mild neuropathy, OSA etc. He has very sedentary life style, he sits in the chair and watch TV most of time during the day. He only comes out for shopping for a few times a week. He uses a Barr[employer]'s [employer], he can walk for a few hundrends feet before he has to stop to catch his breasts. He denies cough or sputum production, no GI discomfort, he has been having mild dirrhea, loose stool, twice daily, no melena or hematochezia.  CURRENT THERAPY: pending chemotherapy   INTERIM HISTORY: Mr. Babler returns for follow-up. He is accompanied by his son to the clinic today. He has completed radiation last week, and tolerated well overall. He still has multiple complains, intermittent stabbing abdominal pain, especially with eating, he has lost some weight, no dysphagia. He has worsening fatigue, sleeps more lately. He is not physically active, spends most of time in chair or bed.  MEDICAL HISTORY:  Past Medical History:  Diagnosis Date  . Asthma    as a child  . Cataract    multiple types, bilateral  . Chronic diastolic CHF (congestive heart failure) ([place]) 02/25/2009  . Chronic renal insufficiency, stage III (moderate) 2017   Stage II/III (GFR around 60)  .  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 COPD (chronic obstructive pulmonary disease) ([place])   . Coronary artery disease    chronically occluded LAD and diagonal with right to left collaterals, mild disease in the left circ and moderate disease in the mid RCA on medical management with Imdur, ASA, and Plavix.  . Dermatitis 05/2014  . DIABETES MELLITUS, TYPE II 06/24/2007   No diab retpthy as of 08/05/15 eye exam.  . Esophageal cancer ([place]) 04/2016   poorly differentiated carcinoma (Dr. Pyrtle--EGD).  CT C/A/P showed metastatic adenopathy in mediastinum and upper abdomen 05/09/16.  Tx plan is palliative radiation (to end 06/22/16), then systemic chemotherapy after he recovers from radiation.  . Essential hypertension 05/01/2007   Qualifier: Diagnosis of  By: Tiney Rouge CMA, Ellison Hughs     . History of kidney stones   . Hyperkalemia 12/2015   Decreased ACE-I by 50% in response, then potassium normalized.  Marland Kitchen HYPERLIPIDEMIA 06/24/2007  . HYPERTENSION 05/01/2007  . Hypoxemia 01/27/2010  . Myocardial infarction [facility])    pt states he was informed per MD that he has had one but pt was unaware   . OBESITY 09/02/2008  . Obesity hypoventilation syndrome (HCC)    oxygen 24/7 (2 liters [place] as of 02/2016)  . On home oxygen therapy    Oxygen @ 2l/m nasally 24/7 hours  . OSA (obstructive sleep apnea)    not tested; pt scored 4 per stop bang tool results sent to PCP   . OSTEOARTHRITIS 05/01/2007  . Presbycusis of both ears 05/2015   [name]  . Pruritic condition 05/2014   Allergist summer 2015, no new testing.  [name] field defect 05/10/16   Per Dr. Melina Fiddler, O.D.: Rt, loss of inf/temp quad and some loss sup/temp.  ?CVA  ? Pituitary tumor? ?Brain met.    SURGICAL HISTORY: Past Surgical History:  Procedure Laterality Date  . APPENDECTOMY    . BALLOON DILATION N/A 05/04/2016   Procedure: BALLOON DILATION;  Surgeon: Jerene Bears, MD;  Location: [facility];   Service: Gastroenterology;  Laterality: N/A;  . CARDIAC CATHETERIZATION    . CARPAL TUNNEL RELEASE Left   . CATARACT EXTRACTION W/PHACO Right 04/29/2013   Procedure: CATARACT EXTRACTION PHACO AND INTRAOCULAR LENS PLACEMENT (IOC);  Surgeon: Adonis Brook, MD;  Location: [place];  Service: Ophthalmology;  Laterality: Right;  . CATARACT EXTRACTION, BILATERAL    . COLONOSCOPY W/ POLYPECTOMY  08/2011   Many polyps--all hyperplastic, severe diverticulosis, int hem.  [employer] hemoccult testing via [facility] 06/22/15 NEG  . ESOPHAGOGASTRODUODENOSCOPY N/A 05/04/2016   Procedure: ESOPHAGOGASTRODUODENOSCOPY (EGD);  Surgeon: Jerene Bears, MD;  Location: [facility];  Service: Gastroenterology;  Laterality: N/A;  . KNEE ARTHROSCOPY WITH MEDIAL MENISECTOMY Right 12/16/2014   Procedure: RIGHT KNEE ARTHROSCOPY WITH MEDIAL MENISECTOMY microfracture medial femoral condyle abrasion condroplasty medial femoral condyle lateral menisectomy;  Surgeon: Tobi Bastos, MD;  Location: [facility];  Service: Orthopedics;  Laterality: Right;  . LEFT HEART CATHETERIZATION WITH CORONARY ANGIOGRAM N/A 10/26/2013   Procedure: LEFT HEART CATHETERIZATION WITH CORONARY ANGIOGRAM;  Surgeon: Jettie Booze, MD;  Location:  [facility];  Service: Cardiovascular;  Laterality: N/A;  . LUMBAR [place] SURGERY  2001  . SHOULDER SURGERY Right    right fx  . TONSILLECTOMY    . TRANSTHORACIC ECHOCARDIOGRAM  03/2016   EF 55-60%, grade I DD, LAE  . WRIST SURGERY Right    right fx    SOCIAL HISTORY: Social History   Social History  . Marital status: Married    Spouse name: susan  . Number of children: 2  . Years of education: N/A   Occupational History  . Retired    Social History Main Topics  . Smoking status: Former Smoker    Packs/day: 1.50    Years: 30.00    Types: Cigarettes, Pipe, Cigars    Quit date: 10/30/1983  . Smokeless tobacco: Former Systems developer    Types: Chew    Quit date: 05/15/2016  . Alcohol use No  . Drug use: No  . Sexual  activity: Not on file   Other Topics Concern  . Not on file   Social History Narrative   Married, one son and one daughter.   His daughter and her two children live with him.   Coffee daily.  Former smoker.  No alcohol.   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   Attends church weekly-   Oxygen continuous       FAMILY HISTORY: Family History  Problem Relation Age of Onset  . Heart attack Mother   . Aneurysm Father   . Alcohol abuse Father   . Diabetes Maternal Aunt   . Colon cancer Neg Hx     ALLERGIES:  has No Known Allergies.  MEDICATIONS:  Current Outpatient Prescriptions  Medication Sig Dispense Refill  . aspirin 81 MG tablet Take 81 mg by mouth daily.      Marland Kitchen atorvastatin (LIPITOR) 80 MG tablet Take 1 tablet (80 mg total) by mouth daily. 90 tablet 3  . beta carotene w/minerals (OCUVITE) tablet Take 1 tablet by mouth daily.    . cetirizine (ZYRTEC) 10 MG tablet Take 10 mg by mouth daily.    . citalopram (CELEXA) 20 MG tablet Take 1 tablet (20 mg total) by mouth daily. 90 tablet 3  . clonazePAM (KLONOPIN) 1 MG tablet Take 1 tablet (1 mg total) by mouth 2 (two) times daily as needed for anxiety. 60 tablet 5  . cromolyn (OPTICROM) 4 % ophthalmic solution Place 1 drop into both eyes 4 (four) times daily.    Marland Kitchen diltiazem (CARDIZEM CD) 120 MG 24 hr capsule TAKE ONE CAPSULE BY MOUTH ONCE DAILY 30 capsule 5  . ezetimibe (ZETIA) 10 MG tablet Take 1 tablet (10 mg total) by mouth daily. 30 tablet 11  . gabapentin (NEURONTIN) 300 MG capsule TAKE ONE CAPSULE BY MOUTH THREE TIMES DAILY 270 capsule 3  . glucose blood (ONE TOUCH ULTRA TEST) test strip TEST 3 TIMES A DAY AS DIRECTED 100 each 11  . HUMALOG KWIKPEN 200 UNIT/ML SOPN INJECT 24 UNITS SUBCUTANEOUSLY THREE TIMES DAILY BEFORE MEALS 15 mL 6  . HUMULIN N KWIKPEN 100 UNIT/ML Kiwkpen INJECT 120 UNITS SUBCUTANEOUSLY WITH BREAKFAST AND 50 UNITS SUBCUTANEOUSLY WITH SUPPER 60 mL 6  . hydrOXYzine  (ATARAX/VISTARIL) 25 MG tablet 1 tab at supper and 1 at bedtime (for insomnia) 60 tablet 11  . isosorbide mononitrate (IMDUR) 60 MG 24 hr tablet Take 1 tablet (60 mg total) by mouth at bedtime. 30 tablet 11  . lisinopril (PRINIVIL,ZESTRIL) 40 MG tablet  Take 20 mg by mouth daily.    . metFORMIN (GLUCOPHAGE) 1000 MG tablet TAKE ONE TABLET BY MOUTH TWICE DAILY WITH MEALS 60 tablet 6  . Omega-3 Fatty Acids ([employer] FISH OIL) 1000 MG CAPS Take 2 tablets by mouth 2 (two) times daily. 90 capsule   . ONETOUCH DELICA LANCETS 85I MISC 1 each by Other route 3 (three) times daily. USE TO TEST 3 TIMES A DAY 100 each 11  . OXYGEN Inhale 2 L/min into the lungs continuous.    . pantoprazole (PROTONIX) 40 MG tablet Take 1 tablet (40 mg total) by mouth 2 (two) times daily. 60 tablet 3  . sucralfate (CARAFATE) 1 GM/10ML suspension Take 10 mLs (1 g total) by mouth 4 (four) times daily. 420 mL 1  . torsemide (DEMADEX) 20 MG tablet Take 1 tablet (20 mg total) by mouth daily. Take extra tab as directed for weight gain. 45 tablet 11   No current facility-administered medications for this visit.     REVIEW OF SYSTEMS:   Constitutional: Denies fevers, chills or abnormal night sweats   Eyes: Denies blurriness of vision, double vision or watery eyes Ears, nose, mouth, throat, and face: Denies mucositis or sore throat Respiratory: Denies cough, dyspnea or wheezes Cardiovascular: Denies palpitation, chest discomfort or lower extremity swelling Gastrointestinal:  Denies nausea, heartburn or change in bowel habits Skin: Denies abnormal skin rashes Lymphatics: Denies new lymphadenopathy or easy bruising Neurological:Denies numbness, tingling or new weaknesses Behavioral/Psych: Mood is stable, no new changes  All other systems were reviewed with the patient and are negative.  PHYSICAL EXAMINATION: ECOG PERFORMANCE STATUS: 3 - Symptomatic, >50% confined to bed  Vitals:   06/25/16 1338  BP: (!) 109/59  Pulse: 90  Resp: 17   Temp: 98.2 F (36.8 C)   Filed Weights   06/25/16 1338  Weight: 297 lb 14.4 oz (135.1 kg)    GENERAL:alert, no distress and comfortable, morbid obese, sitting in wheelchair with nasal cannula oxygen SKIN: skin color, texture, turgor are normal, no rashes or significant lesions EYES: normal, conjunctiva are pink and non-injected, sclera clear OROPHARYNX:no exudate, no erythema and lips, buccal mucosa, and tongue normal  NECK: supple, thyroid normal size, non-tender, without nodularity LYMPH:  no palpable lymphadenopathy in the cervical, axillary or inguinal LUNGS: (+) Scatter rales on bilateral lung base, no wheezing HEART: regular rate & rhythm and no murmurs and no lower extremity edema ABDOMEN:abdomen soft, non-tender and normal bowel sounds Musculoskeletal:no cyanosis of digits and no clubbing  PSYCH: alert & oriented x 3 with fluent speech NEURO: no focal motor/sensory deficits EXT: Trace edema, he wears compression stocks  LABORATORY DATA:  I have reviewed the data as listed CBC Latest Ref Rng & Units 06/25/2016 06/08/2016 03/16/2016  WBC 4.0 - 10.3 10e3/uL 6.2 9.7 9.2  Hemoglobin 13.0 - 17.1 g/dL 12.6(L) 12.0(L) 12.5(L)  Hematocrit 38.4 - 49.9 % 39.1 36.7(L) 41.1  Platelets 140 - 400 10e3/uL 132(L) 268 214   CMP Latest Ref Rng & Units 06/25/2016 06/08/2016 05/07/2016  Glucose 70 - 140 mg/dl 81 116 129(H)  BUN 7.0 - 26.0 mg/dL 14.4 27.3(H) 23  Creatinine 0.7 - 1.3 mg/dL 0.9 1.1 1.30  Sodium 136 - 145 mEq/L 139 136 136  Potassium 3.5 - 5.1 mEq/L 4.6 4.7 4.6  Chloride 96 - 112 mEq/L - - 99  CO2 22 - 29 mEq/L 30(H) 25 31  Calcium 8.4 - 10.4 mg/dL 9.3 9.0 9.0  Total Protein 6.4 - 8.3 g/dL 7.3 7.4 -  Total Bilirubin 0.20 - 1.20 mg/dL 0.48 0.57 -  Alkaline Phos 40 - 150 U/L 51 58 -  AST 5 - 34 U/L 22 17 -  ALT 0 - 55 U/L 19 15 -    PATHOLOGY REPORT Diagnosis 05/04/2016 1. Esophagogastric junction, biopsy, ulcerative mass - POORLY DIFFERENTIATED CARCINOMA, SEE COMMENT. 2.  Stomach, biopsy - REACTIVE GASTROPATHY. - NEGATIVE FOR HELICOBACTER PYLORI. - NO INTESTINAL METAPLASIA, DYSPLASIA, OR MALIGNANCY. Microscopic Comment 1. The majority of the specimen consists of gastroesophageal mucosa with reflux changes. There is a small focus of tumor underlying the squamous mucosa. There is lymphovascular invasion. There is no background intestinal metaplasia (Barrett's esophagus). Immunohistochemistry reveals only very focal weak cytokeratin 5/6, negative p63, and negative mucicarmine in the limited remaining tumor. Dr. Saralyn Pilar has reviewed the case. The case was called to Dr. Hilarie Fredrickson on 05/08/2016. 2. A Warthin-Starry stain is performed to determine the possibility of the presence of Helicobacter pylori. The Warthin-Starry stain is negative for organisms of Helicobacter pylori.  RADIOGRAPHIC STUDIES: I have personally reviewed the radiological images as listed and agreed with the findings in the report. No results found. EGD 05/04/2016 A large, ulcerating mass with no active bleeding and no stigmata of recent bleeding was found at the gastroesophageal junction extending into the gastric cardia, beginning 40 cm from the incisors. The mass was non-obstructing and partially circumferential (involving one-half of the lumen circumference). The mass extends approximately 5 cm. IMPRESSION:  -Likely malignant esophageal tumor was found at the gastroesophageal junction extending into the gastric cardia. Biopsied. - Non-bleeding erosive gastropathy. Biopsied. - Erythematous duodenopathy. - Normal second portion of the duodenum.  ASSESSMENT & PLAN: 70 year old Caucasian male, with multiple comorbidities, including hypertension, diabetes, OSA, coronary artery disease, diastolic CHF, on continuous oxygen, morbid obesity, presented with progressive dysphagia and odynophagia.  1. Cancer of cardio-esophageal junction, poorly differentiated, cTxN2M1, stage IV  -I reviewed his CT scan,  EGD, and the biopsy results with patient and his family members in great details. -His biopsy pathology was reviewed in our tumor board 2 days ago, the morphology and weakly positive for CK 5/6, fevers squamous cell carcinoma -His CT scan findings are very concerning for distant metastasis to abdominal lymph nodes.  -I reviewed the PET scan images with patient and his daughter in person today, he has hypermetabolic GE junction tumor, and diffuse adenopathy in mediastinum and upper abdomen. -We discussed the aggressive nature of esophageal cancer, an incurable nature of his disease due to the distant metastasis. The goal of therapy is palliative, to prolong his life and palliate his symptoms. -He has completed palliative radiation -Due to his poor PS, and multiple comorbidities, I do not think he is a candidate for intensive chemotherapy  -Pt expressed strong wish to try chemo. I will let him try low dose weekly carbo and taxol. If he does not tolerate well, will change to weekly taxol or hold chemo --Chemotherapy consent: Side effects including but does not not limited to, fatigue, nausea, vomiting, diarrhea, hair loss, neuropathy, fluid retention, renal and kidney dysfunction, neutropenic fever, needed for blood transfusion, bleeding, were discussed with patient in great detail. He agrees to proceed.  2. CAD, diastolic CHF, on continuous oxygen -He will continue follow-up with his cardiologist Dr. Radford Pax -His Plavix has been held since the EGD and biopsy, he is on baby aspirin. -Need to watch his fluid intake during his chemotherapy  3. DM, HTN -We discussed his blood pressure and glucose need to be monitored closely during the  chemotherapy, which may affect his blood glucose and blood pressure -He will continue medication, monitor his sugar and blood pressure at home, and follow-up with his primary care physician  4. OSA, morbid obesity -We discussed healthy diet, I encouraged him to to be more  physically active, and consider home PT -He will be seen by our dietitian Pamala Hurry today. I strongly encouraged him to follow-up with dietitian during the therapy for nutrition support.  5. Epigastric pain -Likely secondary to his underlying malignancy -worse after radiation -I gave him a prescription of tramadol today    Plan -he will try tramadol for his abdominal pain -lab, f/u and first weekly carbo and taxol on 9/8  All questions were answered. The patient knows to call the clinic with any problems, questions or concerns. I spent 25 minutes counseling the patient face to face. The total time spent in the appointment was 30 minutes and more than 50% was on counseling.     Truitt Merle, MD 06/25/2016 2:42 PM

## 2016-06-25 NOTE — Telephone Encounter (Signed)
Gave patient son avs report and appointments for August and September

## 2016-06-25 NOTE — Patient Instructions (Signed)
Try taking the Tramadol 30 minutes before a meal to decrease pain Try Ensure or Boost supplements for protein and calories You pain should improve over the next few weeks Will start IV chemo weekly: Taxol/Carboplatin (OK to eat before treatment) Chemo class this week and start treatment next week

## 2016-06-25 NOTE — Progress Notes (Signed)
Oncology Nurse Navigator Documentation  Oncology Nurse Navigator Flowsheets 06/25/2016  Navigator Location [facility]  Navigator Encounter Type Follow-up Appt  Abnormal Finding Date 05/04/2016  Confirmed Diagnosis Date 05/04/2016  Treatment Initiated Date 06/04/2016  Patient Visit Type MedOnc  Treatment Phase Pre-Tx/Tx Discussion--RT completed 8/25-chemo to start 07/06/16  Barriers/Navigation Needs Education  Education Pain/ Symptom Management;Preparing for Upcoming  Treatment--premed for eating, soft foods, OK to eat before chemo  Interventions Referrals;Education Method  Referrals Nutrition/dietician--will see in tx area on 07/06/16; obtained samples of Boost/Ensure to give him on 8/30 education class.  Coordination of Care -  Education Method Verbal;Written  Support Groups/Services -  Acuity Level 2  Time Spent with Patient 30

## 2016-06-26 ENCOUNTER — Other Ambulatory Visit: Payer: Self-pay | Admitting: Hematology

## 2016-06-27 ENCOUNTER — Encounter: Payer: Self-pay | Admitting: *Deleted

## 2016-06-27 ENCOUNTER — Other Ambulatory Visit: Payer: Medicare Other

## 2016-06-30 ENCOUNTER — Encounter: Payer: Self-pay | Admitting: Hematology

## 2016-06-30 NOTE — Addendum Note (Signed)
Addended by: Truitt Merle on: 06/30/2016 10:23 PM   Modules accepted: Orders

## 2016-07-03 ENCOUNTER — Other Ambulatory Visit: Payer: Self-pay | Admitting: Family Medicine

## 2016-07-03 ENCOUNTER — Encounter: Payer: Self-pay | Admitting: Family Medicine

## 2016-07-04 NOTE — Telephone Encounter (Signed)
rx faxed

## 2016-07-04 NOTE — Telephone Encounter (Signed)
Pt requesting RF of klonopin. Last Rx printed 01/04/16 # 60 x 5 rfs. Last OV 03/08/16 Next OV 07/10/16 [facility]Lease advise.

## 2016-07-06 ENCOUNTER — Telehealth: Payer: Self-pay | Admitting: *Deleted

## 2016-07-06 ENCOUNTER — Ambulatory Visit (HOSPITAL_BASED_OUTPATIENT_CLINIC_OR_DEPARTMENT_OTHER): Payer: Medicare Other | Admitting: Hematology

## 2016-07-06 ENCOUNTER — Ambulatory Visit: Payer: Medicare Other | Admitting: Nutrition

## 2016-07-06 ENCOUNTER — Ambulatory Visit (HOSPITAL_BASED_OUTPATIENT_CLINIC_OR_DEPARTMENT_OTHER): Payer: Medicare Other

## 2016-07-06 ENCOUNTER — Other Ambulatory Visit (HOSPITAL_BASED_OUTPATIENT_CLINIC_OR_DEPARTMENT_OTHER): Payer: Medicare Other

## 2016-07-06 VITALS — BP 123/70 | HR 93 | Temp 97.8°F | Resp 18

## 2016-07-06 VITALS — BP 90/42 | HR 102 | Temp 98.5°F | Resp 18 | Ht 68.0 in | Wt 295.7 lb

## 2016-07-06 DIAGNOSIS — C160 Malignant neoplasm of cardia: Secondary | ICD-10-CM

## 2016-07-06 DIAGNOSIS — I5032 Chronic diastolic (congestive) heart failure: Secondary | ICD-10-CM

## 2016-07-06 DIAGNOSIS — E119 Type 2 diabetes mellitus without complications: Secondary | ICD-10-CM

## 2016-07-06 DIAGNOSIS — Z5111 Encounter for antineoplastic chemotherapy: Secondary | ICD-10-CM

## 2016-07-06 DIAGNOSIS — I10 Essential (primary) hypertension: Secondary | ICD-10-CM

## 2016-07-06 LAB — COMPREHENSIVE METABOLIC PANEL
ALBUMIN: 3.1 g/dL — AB (ref 3.5–5.0)
ALK PHOS: 48 U/L (ref 40–150)
ALT: 22 U/L (ref 0–55)
AST: 24 U/L (ref 5–34)
Anion Gap: 10 mEq/L (ref 3–11)
BILIRUBIN TOTAL: 0.53 mg/dL (ref 0.20–1.20)
BUN: 17.9 mg/dL (ref 7.0–26.0)
CALCIUM: 9 mg/dL (ref 8.4–10.4)
CHLORIDE: 101 meq/L (ref 98–109)
CO2: 23 mEq/L (ref 22–29)
CREATININE: 1 mg/dL (ref 0.7–1.3)
EGFR: 76 mL/min/{1.73_m2} — ABNORMAL LOW (ref >90)
Glucose: 152 mg/dl — ABNORMAL HIGH (ref 70–140)
Potassium: 4.9 mEq/L (ref 3.5–5.1)
Sodium: 134 mEq/L — ABNORMAL LOW (ref 136–145)
TOTAL PROTEIN: 6.8 g/dL (ref 6.4–8.3)

## 2016-07-06 LAB — CBC WITH DIFFERENTIAL/PLATELET
BASO%: 0.4 % (ref 0.0–2.0)
Basophils Absolute: 0 10*3/uL (ref 0.0–0.1)
EOS%: 5.3 % (ref 0.0–7.0)
Eosinophils Absolute: 0.3 10*3/uL (ref 0.0–0.5)
HEMATOCRIT: 37 % — AB (ref 38.4–49.9)
HEMOGLOBIN: 11.9 g/dL — AB (ref 13.0–17.1)
LYMPH#: 0.2 10*3/uL — AB (ref 0.9–3.3)
LYMPH%: 4.3 % — ABNORMAL LOW (ref 14.0–49.0)
MCH: 27.3 pg (ref 27.2–33.4)
MCHC: 32.1 g/dL (ref 32.0–36.0)
MCV: 85.1 fL (ref 79.3–98.0)
MONO#: 1 10*3/uL — AB (ref 0.1–0.9)
MONO%: 19.1 % — ABNORMAL HIGH (ref 0.0–14.0)
NEUT%: 70.9 % (ref 39.0–75.0)
NEUTROS ABS: 3.7 10*3/uL (ref 1.5–6.5)
Platelets: 171 10*3/uL (ref 140–400)
RBC: 4.35 10*6/uL (ref 4.20–5.82)
RDW: 16.1 % — AB (ref 11.0–14.6)
WBC: 5.3 10*3/uL (ref 4.0–10.3)

## 2016-07-06 MED ORDER — SODIUM CHLORIDE 0.9 % IV SOLN: INTRAVENOUS | Status: AC

## 2016-07-06 MED ORDER — FAMOTIDINE IN NACL 20-0.9 MG/50ML-% IV SOLN
20.0000 mg | Freq: Once | INTRAVENOUS | Status: AC
  Administered 2016-07-06: 20 mg via INTRAVENOUS

## 2016-07-06 MED ORDER — SODIUM CHLORIDE 0.9 % IV SOLN
80.0000 mg/m2 | Freq: Once | INTRAVENOUS | Status: AC
  Administered 2016-07-06: 204 mg via INTRAVENOUS
  Filled 2016-07-06: qty 34

## 2016-07-06 MED ORDER — PROCHLORPERAZINE MALEATE 10 MG PO TABS: 10.0000 mg | ORAL_TABLET | Freq: Four times a day (QID) | ORAL | 1 refills | Status: DC | PRN

## 2016-07-06 MED ORDER — DIPHENHYDRAMINE HCL 50 MG/ML IJ SOLN
INTRAMUSCULAR | Status: AC
  Filled 2016-07-06: qty 1

## 2016-07-06 MED ORDER — DIPHENHYDRAMINE HCL 50 MG/ML IJ SOLN
50.0000 mg | Freq: Once | INTRAMUSCULAR | Status: AC
  Administered 2016-07-06: 50 mg via INTRAVENOUS

## 2016-07-06 MED ORDER — ONDANSETRON HCL 40 MG/20ML IJ SOLN
Freq: Once | INTRAMUSCULAR | Status: AC
  Administered 2016-07-06: 12:00:00 via INTRAVENOUS
  Filled 2016-07-06: qty 8

## 2016-07-06 MED ORDER — SODIUM CHLORIDE 0.9 % IV SOLN
Freq: Once | INTRAVENOUS | Status: AC
  Administered 2016-07-06: 11:00:00 via INTRAVENOUS

## 2016-07-06 MED ORDER — SODIUM CHLORIDE 0.9 % IV SOLN
10.0000 mg | Freq: Once | INTRAVENOUS | Status: AC
  Administered 2016-07-06: 10 mg via INTRAVENOUS
  Filled 2016-07-06: qty 1

## 2016-07-06 MED ORDER — ONDANSETRON HCL 8 MG PO TABS: 8.0000 mg | ORAL_TABLET | Freq: Two times a day (BID) | ORAL | 1 refills | Status: DC | PRN

## 2016-07-06 MED ORDER — TRAMADOL HCL 50 MG PO TABS: 50.0000 mg | ORAL_TABLET | Freq: Three times a day (TID) | ORAL | 1 refills | Status: DC | PRN

## 2016-07-06 MED ORDER — FAMOTIDINE IN NACL 20-0.9 MG/50ML-% IV SOLN
INTRAVENOUS | Status: AC
  Filled 2016-07-06: qty 50

## 2016-07-06 NOTE — Progress Notes (Signed)
[name]  [phone] [phone]  Clinic Follow up Note   Patient Care Team: Tammi Sou, MD as PCP - General (Family Medicine) Ulyses Southward, MD as Consulting Physician (Allergy and Immunology) Calton Dach, MD as Consulting Physician (Optometry) Sueanne Margarita, MD as Consulting Physician (Cardiology) Jerene Bears, MD as Consulting Physician (Gastroenterology) Latanya Maudlin, MD as Consulting Physician (Orthopedic Surgery) Jodi Marble, MD as Consulting Physician (Otolaryngology) Truitt Merle, MD as Consulting Physician (Hematology) Kyung Rudd, MD as Consulting Physician (Radiation Oncology) 07/06/2016    CHIEF COMPLAINTS:  Follow up GE junction cancer  Oncology History   Cancer of cardio-esophageal junction [facility])   Staging form: Stomach, AJCC 7th Edition     Clinical stage from 05/04/2016: Stage IV ([place], N2, M1) - Signed by Truitt Merle, MD on 05/18/2016        Cancer of cardio-esophageal junction ([place])   05/04/2016 Initial Diagnosis    Cancer of cardio-esophageal junction ([place])      05/04/2016 Procedure    EGD showed a large ulcerating mass with no active bleeding at the gastroesophageal junction extending into the gastric cardia. The mass was not obstructing and partially circumferential, extends approximately 5 cm. Nonbleeding erosive gastropathy.      05/04/2016 Initial Biopsy    Esophageal gastric junction biopsy showed a poorly differentiated carcinoma underlying the squamous mucosa. There is lymphovascular invasion. No Intestinal metaplasia. IHC weakly positive CK5/6, p63 (-), favor squamous.        05/09/2016 Imaging    CT CAP w contrast showed mild wall thickening involving the distal esophagus and proximal stomach compatible with known cancer, enlarged mediastinal and upper abdominal lymph nodes are highly suspicious for metastatic adenopathy, propable liver cirrhosis.      06/04/2016 - 06/22/2016 Radiation Therapy   palliative radiation to esophageal cancer        HISTORY OF PRESENTING ILLNESS:  [name] 70 y.o. male is here because of His reason that diagnosed GE junction carcinoma. He is a comment of by his wife and daughter to our multidisciplinary chart clinic today.  He has been having progressive dysphagia and odynophagia for 2 month, he has been eating soft diet only in the past one week. He has pain in the mid chest and epigastric area only when he eats, with burning sensation, no nausea, abdominal bloating, or other discomfort. His appetite has remained well, no weight loss,   He has multiple medical problems, especially heart failure, he has been on oxygen continuously for 5-6 years. He is morbidly obese, has diabetes, hypertension, mild neuropathy, OSA etc. He has very sedentary life style, he sits in the chair and watch TV most of time during the day. He only comes out for shopping for a few times a week. He uses a [employer], he can walk for a few hundrends feet before he has to stop to catch his breasts. He denies cough or sputum production, no GI discomfort, he has been having mild dirrhea, loose stool, twice daily, no melena or hematochezia.  CURRENT THERAPY: chemotherapy with weekly carboplatin and taxol, starting on 07/06/2016  INTERIM HISTORY: [name] returns for follow-up and the first cycle chemotherapy. He still has mild to moderate epigastric pain only when he eats, He takes tramadol for that. No significant dysplasia. He states he has been drinking fluids adequately. No other new complaints. He is  on continuous oxygen, has moderate fatigue, not physically active, spends most time in chair  and bed.   MEDICAL HISTORY:  Past Medical History:  Diagnosis Date  . Asthma    as a child  . Cataract    multiple types, bilateral  . Chronic diastolic CHF (congestive heart failure) (HCC) 02/25/2009  . Chronic renal insufficiency, stage III (moderate) 2017   Stage II/III (GFR  around 60)  . 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 COPD (chronic obstructive pulmonary disease) (HCC)   . Coronary artery disease    chronically occluded LAD and diagonal with right to left collaterals, mild disease in the left circ and moderate disease in the mid RCA on medical management with Imdur, ASA, and Plavix.  . Dermatitis 05/2014  . DIABETES MELLITUS, TYPE II 06/24/2007   No diab retpthy as of 08/05/15 eye exam.  . 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 Pyrtle--EGD).  CT C/A/P showed metastatic adenopathy in mediastinum and upper abdomen 05/09/16.  Tx plan is palliative radiation (completed 06/22/16), then palliative systemic chemotherapy after he recovers from radiation.  . Essential hypertension 05/01/2007   Qualifier: Diagnosis of  By: Archie CMA, Lakisha     . History of kidney stones   . Hyperkalemia 12/2015   Decreased ACE-I by 50% in response, then potassium normalized.  . HYPERLIPIDEMIA 06/24/2007  . HYPERTENSION 05/01/2007  . Hypoxemia 01/27/2010  . Myocardial infarction (HCC)    pt states he was informed per MD that he has had one but pt was unaware   . OBESITY 09/02/2008  . Obesity hypoventilation syndrome (HCC)    oxygen 24/7 (2 liters [place] as of 02/2016)  . On home oxygen therapy    Oxygen @ 2l/m nasally 24/7 hours  . OSA (obstructive sleep apnea)    not tested; pt scored 4 per stop bang tool results sent to PCP   . OSTEOARTHRITIS 05/01/2007  . Presbycusis of both ears 05/2015   [facility]  . Pruritic condition 05/2014   Allergist summer 2015, no new testing.  . Visual field defect 05/10/16   Per Dr. Bernsdorf, O.D.: Rt, loss of inf/temp quad and some loss sup/temp.  ?CVA  ? Pituitary tumor? ?Brain met.    SURGICAL HISTORY: Past Surgical History:  Procedure Laterality Date  . APPENDECTOMY    . BALLOON DILATION N/A 05/04/2016   Procedure: BALLOON DILATION;  Surgeon: Jay M Pyrtle, MD;   Location: [facility];  Service: Gastroenterology;  Laterality: N/A;  . CARDIAC CATHETERIZATION    . CARPAL TUNNEL RELEASE Left   . CATARACT EXTRACTION W/PHACO Right 04/29/2013   Procedure: CATARACT EXTRACTION PHACO AND INTRAOCULAR LENS PLACEMENT (IOC);  Surgeon: Greer Geiger, MD;  Location: [facility];  Service: Ophthalmology;  Laterality: Right;  . CATARACT EXTRACTION, BILATERAL    . COLONOSCOPY W/ POLYPECTOMY  08/2011   Many polyps--all hyperplastic, severe diverticulosis, int hem.  [employer] hemoccult testing via [facility] 06/22/15 NEG  . ESOPHAGOGASTRODUODENOSCOPY N/A 05/04/2016   Procedure: ESOPHAGOGASTRODUODENOSCOPY (EGD);  Surgeon: Jay M Pyrtle, MD;  Location: [facility];  Service: Gastroenterology;  Laterality: N/A;  . KNEE ARTHROSCOPY WITH MEDIAL MENISECTOMY Right 12/16/2014   Procedure: RIGHT KNEE ARTHROSCOPY WITH MEDIAL MENISECTOMY microfracture medial femoral condyle abrasion condroplasty medial femoral condyle lateral menisectomy;  Surgeon: Ronald A Gioffre, MD;  Location: [facility];  Service: Orthopedics;  Laterality: Right;  . LEFT HEART CATHETERIZATION WITH CORONARY ANGIOGRAM N/A 10/26/2013   Procedure: LEFT HEART CATHETERIZATION WITH CORONARY ANGIOGRAM;  Surgeon: Jayadeep S   Varanasi, MD;  Location: [facility];  Service: Cardiovascular;  Laterality: N/A;  . LUMBAR DISC SURGERY  2001  . SHOULDER SURGERY Right    right fx  . TONSILLECTOMY    . TRANSTHORACIC ECHOCARDIOGRAM  03/2016   EF 55-60%, grade I DD, LAE  . WRIST SURGERY Right    right fx    SOCIAL HISTORY: Social History   Social History  . Marital status: Married    Spouse name: susan  . Number of children: 2  . Years of education: N/A   Occupational History  . Retired    Social History Main Topics  . Smoking status: Former Smoker    Packs/day: 1.50    Years: 30.00    Types: Cigarettes, Pipe, Cigars    Quit date: 10/30/1983  . Smokeless tobacco: Former User    Types: Chew    Quit date: 05/15/2016  . Alcohol use No  .  Drug use: No  . Sexual activity: Not on file   Other Topics Concern  . Not on file   Social History Narrative   Married, one son and one daughter.   His daughter and her two children live with him.   Coffee daily.  Former smoker.  No alcohol.   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   Attends church weekly-   Oxygen continuous       FAMILY HISTORY: Family History  Problem Relation Age of Onset  . Heart attack Mother   . Aneurysm Father   . Alcohol abuse Father   . Diabetes Maternal Aunt   . Colon cancer Neg Hx     ALLERGIES:  has No Known Allergies.  MEDICATIONS:  Current Outpatient Prescriptions  Medication Sig Dispense Refill  . aspirin 81 MG tablet Take 81 mg by mouth daily.      . atorvastatin (LIPITOR) 80 MG tablet Take 1 tablet (80 mg total) by mouth daily. 90 tablet 3  . beta carotene w/minerals (OCUVITE) tablet Take 1 tablet by mouth daily.    . cetirizine (ZYRTEC) 10 MG tablet Take 10 mg by mouth daily.    . citalopram (CELEXA) 20 MG tablet Take 1 tablet (20 mg total) by mouth daily. 90 tablet 3  . clonazePAM (KLONOPIN) 1 MG tablet TAKE ONE TABLET BY MOUTH TWICE DAILY AS NEEDED FOR ANXIETY 60 tablet 5  . cromolyn (OPTICROM) 4 % ophthalmic solution Place 1 drop into both eyes 4 (four) times daily.    . diltiazem (CARDIZEM CD) 120 MG 24 hr capsule TAKE ONE CAPSULE BY MOUTH ONCE DAILY 30 capsule 5  . ezetimibe (ZETIA) 10 MG tablet Take 1 tablet (10 mg total) by mouth daily. 30 tablet 11  . gabapentin (NEURONTIN) 300 MG capsule TAKE ONE CAPSULE BY MOUTH THREE TIMES DAILY 270 capsule 3  . glucose blood (ONE TOUCH ULTRA TEST) test strip TEST 3 TIMES A DAY AS DIRECTED 100 each 11  . HUMALOG KWIKPEN 200 UNIT/ML SOPN INJECT 24 UNITS SUBCUTANEOUSLY THREE TIMES DAILY BEFORE MEALS 15 mL 6  . HUMULIN N KWIKPEN 100 UNIT/ML Kiwkpen INJECT 120 UNITS SUBCUTANEOUSLY WITH BREAKFAST AND 50 UNITS SUBCUTANEOUSLY WITH SUPPER 60 mL 6  . hydrOXYzine  (ATARAX/VISTARIL) 25 MG tablet 1 tab at supper and 1 at bedtime (for insomnia) 60 tablet 11  . isosorbide mononitrate (IMDUR) 60 MG 24 hr tablet Take 1 tablet (60 mg total) by mouth at bedtime. 30 tablet 11  . lisinopril (PRINIVIL,ZESTRIL) 40 MG tablet Take   20 mg by mouth daily.    . metFORMIN (GLUCOPHAGE) 1000 MG tablet TAKE ONE TABLET BY MOUTH TWICE DAILY WITH MEALS 60 tablet 6  . Omega-3 Fatty Acids ([employer] FISH OIL) 1000 MG CAPS Take 2 tablets by mouth 2 (two) times daily. 90 capsule   . ONETOUCH DELICA LANCETS 33G MISC 1 each by Other route 3 (three) times daily. USE TO TEST 3 TIMES A DAY 100 each 11  . OXYGEN Inhale 2 L/min into the lungs continuous.    . pantoprazole (PROTONIX) 40 MG tablet Take 1 tablet (40 mg total) by mouth 2 (two) times daily. 60 tablet 3  . sucralfate (CARAFATE) 1 GM/10ML suspension Take 10 mLs (1 g total) by mouth 4 (four) times daily. 420 mL 1  . torsemide (DEMADEX) 20 MG tablet Take 1 tablet (20 mg total) by mouth daily. Take extra tab as directed for weight gain. 45 tablet 11  . traMADol (ULTRAM) 50 MG tablet Take 1 tablet (50 mg total) by mouth every 8 (eight) hours as needed. 30 tablet 1   No current facility-administered medications for this visit.     REVIEW OF SYSTEMS:   Constitutional: Denies fevers, chills or abnormal night sweats   Eyes: Denies blurriness of vision, double vision or watery eyes Ears, nose, mouth, throat, and face: Denies mucositis or sore throat Respiratory: Denies cough, dyspnea or wheezes Cardiovascular: Denies palpitation, chest discomfort or lower extremity swelling Gastrointestinal:  Denies nausea, heartburn or change in bowel habits Skin: Denies abnormal skin rashes Lymphatics: Denies new lymphadenopathy or easy bruising Neurological:Denies numbness, tingling or new weaknesses Behavioral/Psych: Mood is stable, no new changes  All other systems were reviewed with the patient and are negative.  PHYSICAL EXAMINATION: ECOG  PERFORMANCE STATUS: 3 - Symptomatic, >50% confined to bed  Vitals:   07/06/16 0911  BP: (!) 90/42  Pulse: (!) 102  Resp: 18  Temp: 98.5 F (36.9 C)   Filed Weights   07/06/16 0911  Weight: 295 lb 11.2 oz (134.1 kg)    GENERAL:alert, no distress and comfortable, morbid obese, sitting in wheelchair with nasal cannula oxygen SKIN: skin color, texture, turgor are normal, no rashes or significant lesions EYES: normal, conjunctiva are pink and non-injected, sclera clear OROPHARYNX:no exudate, no erythema and lips, buccal mucosa, and tongue normal  NECK: supple, thyroid normal size, non-tender, without nodularity LYMPH:  no palpable lymphadenopathy in the cervical, axillary or inguinal LUNGS: (+) Scatter rales on bilateral lung base, no wheezing HEART: regular rate & rhythm and no murmurs and no lower extremity edema ABDOMEN:abdomen soft, non-tender and normal bowel sounds Musculoskeletal:no cyanosis of digits and no clubbing  PSYCH: alert & oriented x 3 with fluent speech NEURO: no focal motor/sensory deficits EXT: Trace edema, he wears compression stocks  LABORATORY DATA:  I have reviewed the data as listed CBC Latest Ref Rng & Units 07/06/2016 06/25/2016 06/08/2016  WBC 4.0 - 10.3 10e3/uL 5.3 6.2 9.7  Hemoglobin 13.0 - 17.1 g/dL 11.9(L) 12.6(L) 12.0(L)  Hematocrit 38.4 - 49.9 % 37.0(L) 39.1 36.7(L)  Platelets 140 - 400 10e3/uL 171 132(L) 268   CMP Latest Ref Rng & Units 07/06/2016 06/25/2016 06/08/2016  Glucose 70 - 140 mg/dl 152(H) 81 116  BUN 7.0 - 26.0 mg/dL 17.9 14.4 27.3(H)  Creatinine 0.7 - 1.3 mg/dL 1.0 0.9 1.1  Sodium 136 - 145 mEq/L 134(L) 139 136  Potassium 3.5 - 5.1 mEq/L 4.9 4.6 4.7  Chloride 96 - 112 mEq/L - - -  CO2 22 - 29   mEq/L 23 30(H) 25  Calcium 8.4 - 10.4 mg/dL 9.0 9.3 9.0  Total Protein 6.4 - 8.3 g/dL 6.8 7.3 7.4  Total Bilirubin 0.20 - 1.20 mg/dL 0.53 0.48 0.57  Alkaline Phos 40 - 150 U/L 48 51 58  AST 5 - 34 U/L 24 22 17  ALT 0 - 55 U/L 22 19 15     PATHOLOGY REPORT Diagnosis 05/04/2016 1. Esophagogastric junction, biopsy, ulcerative mass - POORLY DIFFERENTIATED CARCINOMA, SEE COMMENT. 2. Stomach, biopsy - REACTIVE GASTROPATHY. - NEGATIVE FOR HELICOBACTER PYLORI. - NO INTESTINAL METAPLASIA, DYSPLASIA, OR MALIGNANCY. Microscopic Comment 1. The majority of the specimen consists of gastroesophageal mucosa with reflux changes. There is a small focus of tumor underlying the squamous mucosa. There is lymphovascular invasion. There is no background intestinal metaplasia (Barrett's esophagus). Immunohistochemistry reveals only very focal weak cytokeratin 5/6, negative p63, and negative mucicarmine in the limited remaining tumor. Dr. Patrick has reviewed the case. The case was called to Dr. Pyrtle on 05/08/2016. 2. A Warthin-Starry stain is performed to determine the possibility of the presence of Helicobacter pylori. The Warthin-Starry stain is negative for organisms of Helicobacter pylori.  RADIOGRAPHIC STUDIES: I have personally reviewed the radiological images as listed and agreed with the findings in the report. No results found. EGD 05/04/2016 A large, ulcerating mass with no active bleeding and no stigmata of recent bleeding was found at the gastroesophageal junction extending into the gastric cardia, beginning 40 cm from the incisors. The mass was non-obstructing and partially circumferential (involving one-half of the lumen circumference). The mass extends approximately 5 cm. IMPRESSION:  -Likely malignant esophageal tumor was found at the gastroesophageal junction extending into the gastric cardia. Biopsied. - Non-bleeding erosive gastropathy. Biopsied. - Erythematous duodenopathy. - Normal second portion of the duodenum.  ASSESSMENT & PLAN: 70-year-old Caucasian male, with multiple comorbidities, including hypertension, diabetes, OSA, coronary artery disease, diastolic CHF, on continuous oxygen, morbid obesity, presented with  progressive dysphagia and odynophagia.  1. Cancer of cardio-esophageal junction, poorly differentiated, cTxN2M1, stage IV  -I reviewed his CT scan, EGD, and the biopsy results with patient and his family members in great details. -His biopsy pathology was reviewed in our tumor board 2 days ago, the morphology and weakly positive for CK 5/6, fevers squamous cell carcinoma -His CT scan findings are very concerning for distant metastasis to abdominal lymph nodes.  -I reviewed the PET scan images with patient and his daughter in person today, he has hypermetabolic GE junction tumor, and diffuse adenopathy in mediastinum and upper abdomen. -We discussed the aggressive nature of esophageal cancer, an incurable nature of his disease due to the distant metastasis. The goal of therapy is palliative, to prolong his life and palliate his symptoms. -He has completed palliative radiation -Due to his comorbidities and the poor performance status, is not a candidate for intensive chemotherapy. However pt strongly wishes to try chemotherapy -His BP was borderline low when he checked pain, he was normal with repeated in the infusion room ([id number]), we'll proceed to chemotherapy today. We'll give him single agent Taxol today, to see how he does. If he tolerates well, we'll start weekly carbo and Taxol next week. -close follow up  -I called in Zofran and Compazine for him today  2. CAD, diastolic CHF, on continuous oxygen -He will continue follow-up with his cardiologist Dr. Turner -His Plavix has been held since the EGD and biopsy, he is on baby aspirin. -Need to watch his fluid intake during his chemotherapy, and also avoid fluid   overload from chemo   3. DM, HTN -We discussed his blood pressure and glucose need to be monitored closely during the chemotherapy, which may affect his blood glucose and blood pressure -He will continue medication, monitor his sugar and blood pressure at home, and follow-up with his  primary care physician -will decrease premed dexa before taxol   4. OSA, morbid obesity -We discussed healthy diet, I encouraged him to to be more physically active, and consider home PT -He will be seen by our dietitian Pamala Hurry today. I strongly encouraged him to follow-up with dietitian during the therapy for nutrition support.  5. Epigastric pain -Likely secondary to his underlying malignancy -worse after radiation -continue tramadol as need    Plan -First dose chemotherapy today, with Taxol alone -Return to clinic next week, if he tolerates well, we'll start him on weekly carbo and Taxol   All questions were answered. The patient knows to call the clinic with any problems, questions or concerns. I spent 20 minutes counseling the patient face to face. The total time spent in the appointment was 25 minutes and more than 50% was on counseling.     Truitt Merle, MD 07/06/2016

## 2016-07-06 NOTE — Progress Notes (Signed)
Late entry for 1100: Dr. Burr Medico notified of blood pressure. Order received to HOLD extra hydration. Pt did not take Demadex prior to visit to avoid needing to use the restroom during treatment. BP monitored throughout infusion. Pt tolerated without difficulty.

## 2016-07-06 NOTE — Telephone Encounter (Signed)
TC from patient stating that he has been home from his 1st Taxol treatment and has felt SOB since he got home. Pt is on home 02 @ 2l/min and he is using his 02.  He states he usually feels like this after taking a shower and shaving (w/o 02 on) but it usually resolves after he puts his 02 back on. Pt states the SOB has not resolved after 1.5 hours and his 02 has been on .  Denies chest pain, any swelling-has compression stockings on. Pt states he did well during his chemotherapy.  Spoke with his chemo nurse and she states that pt told her that he did not take his demadex this am prior to treatment because he did not want to have to get up and use bathroom to void  during his chemotherapy.Marland Kitchen   Spoke with Dr. Burr Medico and she agreed that pt needs to take his demedex and if no releif he should go to ED.  TC back to patient. Asked him about his demadex. He had not taken it yet. Advised pt to take his demadex now.  He verbalized understanding. Advised pt that if this did not resolve and his SOB gets worse or develops chest pain , he needs to go to ED.  He again voiced understanding.

## 2016-07-06 NOTE — Progress Notes (Signed)
70 year old male diagnosed with cancer of the cardioesophageal junction.  He is a patient of Dr. Burr Medico.  Past medical history includes OSA on home oxygen, MI, hypertension, hyperlipidemia, diabetes type 2, renal insufficiency, stage III, and CHF.  Medications include Celexa, Klonopin, Humalog, Glucophage, omega-3 fatty acids, and Protonix.  Labs were reviewed.  Height: 68 inches. Weight: 295.7 pounds on September 8. Usual body weight: 331 pounds January 2017 BMI: 44.96.  Patient has a history of dysphasia and odynophagia however, currently is able to consume soft foods. Patient is delighted with his weight loss. He reports he has had one incident of low blood sugar. He reports he is eating smaller amounts Denies other nutrition impact symptoms.  Nutrition diagnosis:  Inadequate oral intake related to esophageal cancer as evidenced by 11% weight loss in 9 months.  Intervention: Patient was educated to consume smaller more frequent meals and snacks with high-calorie, high-protein foods to minimize further weight loss. Educated patient on soft diet and provided fact sheets. Reviewed high protein foods.  Provided fact sheets. Questions were answered.  Teach back method used.  Contact information was given.  Monitoring, evaluation, goals:  Patient will tolerate increased calories and protein to minimize weight loss throughout treatment.  Next visit: Friday, September 15, during infusion.  **Disclaimer: This note was dictated with voice recognition software. Similar sounding words can inadvertently be transcribed and this note may contain transcription errors which may not have been corrected upon publication of note.**

## 2016-07-06 NOTE — Patient Instructions (Signed)
[facility] Discharge Instructions for Patients Receiving Chemotherapy  Today you received the following chemotherapy agents: Taxol.  To help prevent nausea and vomiting after your treatment, we encourage you to take your nausea medication: Zofran. Take one every 8 hours as needed. You may also take Compazine every 6 hours for nausea not relieved by Zofran.   If you develop nausea and vomiting that is not controlled by your nausea medication, call the clinic.   BELOW ARE SYMPTOMS THAT SHOULD BE REPORTED IMMEDIATELY:  *FEVER GREATER THAN 100.5 F  *CHILLS WITH OR WITHOUT FEVER  NAUSEA AND VOMITING THAT IS NOT CONTROLLED WITH YOUR NAUSEA MEDICATION  *UNUSUAL SHORTNESS OF BREATH  *UNUSUAL BRUISING OR BLEEDING  TENDERNESS IN MOUTH AND THROAT WITH OR WITHOUT PRESENCE OF ULCERS  *URINARY PROBLEMS  *BOWEL PROBLEMS  UNUSUAL RASH Items with * indicate a potential emergency and should be followed up as soon as possible.  Feel free to call the clinic should you have any questions or concerns. The clinic phone number is [phone].  Please show the [facility] at check-in to the Emergency Department and triage nurse.

## 2016-07-06 NOTE — Progress Notes (Signed)
  Radiation Oncology         [phone] ________________________________  Name: OSCAR CARPENITO MRN: [id number]  Date: 06/22/2016  DOB: Mar 11, 1946  End of Treatment Note  Diagnosis:   Esophageal cancer     Indication for treatment::  palliative       Radiation treatment dates:   06/04/2016 through 06/22/2016  Site/dose:   The patient was treated to the esophageal tumor using a 5 field 3-D conformal technique on 3-D tomotherapy. The patient received 37.5 gray in 15 fractions.  Narrative: The patient tolerated radiation treatment relatively well.   He tolerated treatment without substantial difficulties.  Plan: The patient has completed radiation treatment. The patient will return to radiation oncology clinic for routine followup in one month. I advised the patient to call or return sooner if they have any questions or concerns related to their recovery or treatment. ________________________________  Jodelle Gross, M.D., Ph.D.

## 2016-07-07 ENCOUNTER — Encounter: Payer: Self-pay | Admitting: Hematology

## 2016-07-08 DIAGNOSIS — J449 Chronic obstructive pulmonary disease, unspecified: Secondary | ICD-10-CM | POA: Diagnosis not present

## 2016-07-10 ENCOUNTER — Ambulatory Visit (INDEPENDENT_AMBULATORY_CARE_PROVIDER_SITE_OTHER): Payer: Medicare Other | Admitting: Family Medicine

## 2016-07-10 ENCOUNTER — Telehealth: Payer: Self-pay | Admitting: *Deleted

## 2016-07-10 ENCOUNTER — Encounter: Payer: Self-pay | Admitting: Family Medicine

## 2016-07-10 VITALS — BP 95/58 | HR 116 | Temp 98.1°F | Resp 18 | Ht 69.0 in | Wt 280.8 lb

## 2016-07-10 DIAGNOSIS — Z23 Encounter for immunization: Secondary | ICD-10-CM | POA: Diagnosis not present

## 2016-07-10 DIAGNOSIS — I5032 Chronic diastolic (congestive) heart failure: Secondary | ICD-10-CM

## 2016-07-10 DIAGNOSIS — C155 Malignant neoplasm of lower third of esophagus: Secondary | ICD-10-CM

## 2016-07-10 DIAGNOSIS — I9589 Other hypotension: Secondary | ICD-10-CM

## 2016-07-10 DIAGNOSIS — J438 Other emphysema: Secondary | ICD-10-CM

## 2016-07-10 DIAGNOSIS — E119 Type 2 diabetes mellitus without complications: Secondary | ICD-10-CM | POA: Diagnosis not present

## 2016-07-10 LAB — POCT GLYCOSYLATED HEMOGLOBIN (HGB A1C): HEMOGLOBIN A1C: 4.8

## 2016-07-10 MED ORDER — INSULIN ISOPHANE HUMAN 100 UNIT/ML KWIKPEN: PEN_INJECTOR | SUBCUTANEOUS | 6 refills | Status: DC

## 2016-07-10 MED ORDER — INSULIN LISPRO 200 UNIT/ML ~~LOC~~ SOPN: 10.0000 [IU] | PEN_INJECTOR | Freq: Three times a day (TID) | SUBCUTANEOUS | 6 refills | Status: DC

## 2016-07-10 NOTE — Telephone Encounter (Signed)
-----   Message from Brien Few, RN sent at 07/06/2016  4:25 PM EDT ----- Regarding: FENG: Chemo follow up call Taxol 07/06/16. Tolerated well.

## 2016-07-10 NOTE — Telephone Encounter (Signed)
[place] for chemotherapy F/U.  Patient is doing well.  Denies n/v.  Denies any new side effects or symptoms.  Bowel and bladder is functioning well.  Eating and drinking well and I instructed to drink 64 oz minimum daily or at least the day before, of and after treatment.  Denies pain but "I have a history of arthritis so this will get worse".  Denies questions at this time and encouraged to call if needed.  Reviewed how to call after hours in the case of an emergency.

## 2016-07-10 NOTE — Patient Instructions (Signed)
Lower NPH dosing to 90 U every morning and 30 U every evening. Lower humalog dosing to 10 U with each meal. Stop lisinopril.

## 2016-07-10 NOTE — Progress Notes (Signed)
OFFICE VISIT  07/10/2016   CC:  Chief Complaint  Patient presents with  . Annual Exam   HPI:    Patient is a 70 y.o. Caucasian male who presents for 4 mo f/u chronic diast CHF, DM 2, COPD w/obesity hypoventilation syndrome--on 2L oxygen 24/7. Since I last saw him he has been dx'd with stage 4 esophageal cancer and has already received palliative radiation therapy.  He received his first infusion of palliative chemo (taxol) on 07/06/16, and if he tolerates this well the plan is for weekly taxol + carbo: per Dr. Burr Medico in Oncology. He recently saw the nutritionist.  Drinking boost now.  Working on eating small portions frequently.  He tolerated his taxol well, just minimal nausea for a couple hours.  No vomiting. He is complaining of bilat, L>R itching/tingling/numbness in anterior aspect of both thighs. Applying some rx steroid cream and helping.  BP: he doesn't check it at home. BP's at MD visits recently on the low side.  No orthostatic dizziness. He says he drinks a lot of water and tea--plenty of fluids.  Glucoses at home: consistently <150.  Only one hypoglycemic episode and this was when he was getting radiation therapy.  Lowest gluc recently was 78.  ROS: +fatigue.   No n/v/abd pain.  No CP, no palpitations.    Past Medical History:  Diagnosis Date  . Asthma    as a child  . Cataract    multiple types, bilateral  . Chronic diastolic CHF (congestive heart failure) ([place]) 02/25/2009  . Chronic renal insufficiency, stage III (moderate) 2017   Stage II/III (GFR around 60)  . 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 COPD (chronic obstructive pulmonary disease) ([place])   . Coronary artery disease    chronically occluded LAD and diagonal with right to left collaterals, mild disease in the left circ and moderate disease in the mid RCA on medical management with Imdur, ASA, and Plavix.  . Dermatitis 05/2014  . DIABETES  MELLITUS, TYPE II 06/24/2007   No diab retpthy as of 08/05/15 eye exam.  . Esophageal cancer ([place]) 04/2016   poorly differentiated carcinoma (Dr. Pyrtle--EGD).  CT C/A/P showed metastatic adenopathy in mediastinum and upper abdomen 05/09/16.  Tx plan is palliative radiation (completed 06/22/16), then palliative systemic chemotherapy after he recovers from radiation.  . Essential hypertension 05/01/2007   Qualifier: Diagnosis of  By: Tiney Rouge CMA, Ellison Hughs     . History of kidney stones   . Hyperkalemia 12/2015   Decreased ACE-I by 50% in response, then potassium normalized.  Marland Kitchen HYPERLIPIDEMIA 06/24/2007  . HYPERTENSION 05/01/2007  . Hypoxemia 01/27/2010  . Myocardial infarction [facility])    pt states he was informed per MD that he has had one but pt was unaware   . OBESITY 09/02/2008  . Obesity hypoventilation syndrome ([facility])    oxygen 24/7 (2 liters  as of 02/2016)  . On home oxygen therapy    Oxygen @ 2l/m nasally 24/7 hours  . OSA (obstructive sleep apnea)    not tested; pt scored 4 per stop bang tool results sent to PCP   . OSTEOARTHRITIS 05/01/2007  . Presbycusis of both ears 05/2015   [facility]  . Pruritic condition 05/2014   Allergist summer 2015, no new testing.  [name] field defect 05/10/16   Per Dr. Melina Fiddler, O.D.: Rt, loss of inf/temp quad and some loss sup/temp.  ?CVA  ?  Pituitary tumor? ?Brain met.    Past Surgical History:  Procedure Laterality Date  . APPENDECTOMY    . BALLOON DILATION N/A 05/04/2016   Procedure: BALLOON DILATION;  Surgeon: Jerene Bears, MD;  Location: [facility];  Service: Gastroenterology;  Laterality: N/A;  . CARDIAC CATHETERIZATION    . CARPAL TUNNEL RELEASE Left   . CATARACT EXTRACTION W/PHACO Right 04/29/2013   Procedure: CATARACT EXTRACTION PHACO AND INTRAOCULAR LENS PLACEMENT (IOC);  Surgeon: Adonis Brook, MD;  Location: [facility];  Service: Ophthalmology;  Laterality: Right;  . CATARACT EXTRACTION, BILATERAL    . COLONOSCOPY W/ POLYPECTOMY  08/2011   Many  polyps--all hyperplastic, severe diverticulosis, int hem.  [employer] hemoccult testing via [facility] 06/22/15 NEG  . ESOPHAGOGASTRODUODENOSCOPY N/A 05/04/2016   Procedure: ESOPHAGOGASTRODUODENOSCOPY (EGD);  Surgeon: Jerene Bears, MD;  Location: [facility];  Service: Gastroenterology;  Laterality: N/A;  . KNEE ARTHROSCOPY WITH MEDIAL MENISECTOMY Right 12/16/2014   Procedure: RIGHT KNEE ARTHROSCOPY WITH MEDIAL MENISECTOMY microfracture medial femoral condyle abrasion condroplasty medial femoral condyle lateral menisectomy;  Surgeon: Tobi Bastos, MD;  Location: [facility];  Service: Orthopedics;  Laterality: Right;  . LEFT HEART CATHETERIZATION WITH CORONARY ANGIOGRAM N/A 10/26/2013   Procedure: LEFT HEART CATHETERIZATION WITH CORONARY ANGIOGRAM;  Surgeon: Jettie Booze, MD;  Location: [facility] CATH LAB;  Service: Cardiovascular;  Laterality: N/A;  . LUMBAR [place] SURGERY  2001  . SHOULDER SURGERY Right    right fx  . TONSILLECTOMY    . TRANSTHORACIC ECHOCARDIOGRAM  03/2016   EF 55-60%, grade I DD, LAE  . WRIST SURGERY Right    right fx    Outpatient Medications Prior to Visit  Medication Sig Dispense Refill  . aspirin 81 MG tablet Take 81 mg by mouth daily.      Marland Kitchen atorvastatin (LIPITOR) 80 MG tablet Take 1 tablet (80 mg total) by mouth daily. 90 tablet 3  . beta carotene w/minerals (OCUVITE) tablet Take 1 tablet by mouth daily.    . cetirizine (ZYRTEC) 10 MG tablet Take 10 mg by mouth daily.    . citalopram (CELEXA) 20 MG tablet Take 1 tablet (20 mg total) by mouth daily. 90 tablet 3  . clonazePAM (KLONOPIN) 1 MG tablet TAKE ONE TABLET BY MOUTH TWICE DAILY AS NEEDED FOR ANXIETY 60 tablet 5  . cromolyn (OPTICROM) 4 % ophthalmic solution Place 1 drop into both eyes 4 (four) times daily.    Marland Kitchen diltiazem (CARDIZEM CD) 120 MG 24 hr capsule TAKE ONE CAPSULE BY MOUTH ONCE DAILY 30 capsule 5  . ezetimibe (ZETIA) 10 MG tablet Take 1 tablet (10 mg total) by mouth daily. 30 tablet 11  . gabapentin (NEURONTIN)  300 MG capsule TAKE ONE CAPSULE BY MOUTH THREE TIMES DAILY 270 capsule 3  . glucose blood (ONE TOUCH ULTRA TEST) test strip TEST 3 TIMES A DAY AS DIRECTED 100 each 11  . hydrOXYzine (ATARAX/VISTARIL) 25 MG tablet 1 tab at supper and 1 at bedtime (for insomnia) 60 tablet 11  . isosorbide mononitrate (IMDUR) 60 MG 24 hr tablet Take 1 tablet (60 mg total) by mouth at bedtime. 30 tablet 11  . metFORMIN (GLUCOPHAGE) 1000 MG tablet TAKE ONE TABLET BY MOUTH TWICE DAILY WITH MEALS 60 tablet 6  . Omega-3 Fatty Acids ([employer] FISH OIL) 1000 MG CAPS Take 2 tablets by mouth 2 (two) times daily. 90 capsule   . ondansetron (ZOFRAN) 8 MG tablet Take 1 tablet (8 mg total) by mouth 2 (two) times daily as needed  for refractory nausea / vomiting. Start on day 3 after chemo. 30 tablet 1  . ONETOUCH DELICA LANCETS 16B MISC 1 each by Other route 3 (three) times daily. USE TO TEST 3 TIMES A DAY 100 each 11  . OXYGEN Inhale 2 L/min into the lungs continuous.    . pantoprazole (PROTONIX) 40 MG tablet Take 1 tablet (40 mg total) by mouth 2 (two) times daily. 60 tablet 3  . prochlorperazine (COMPAZINE) 10 MG tablet Take 1 tablet (10 mg total) by mouth every 6 (six) hours as needed (Nausea or vomiting). 30 tablet 1  . torsemide (DEMADEX) 20 MG tablet Take 1 tablet (20 mg total) by mouth daily. Take extra tab as directed for weight gain. 45 tablet 11  . traMADol (ULTRAM) 50 MG tablet Take 1 tablet (50 mg total) by mouth every 8 (eight) hours as needed. 60 tablet 1  . HUMALOG KWIKPEN 200 UNIT/ML SOPN INJECT 24 UNITS SUBCUTANEOUSLY THREE TIMES DAILY BEFORE MEALS 15 mL 6  . HUMULIN N KWIKPEN 100 UNIT/ML Kiwkpen INJECT 120 UNITS SUBCUTANEOUSLY WITH BREAKFAST AND 50 UNITS SUBCUTANEOUSLY WITH SUPPER 60 mL 6  . lisinopril (PRINIVIL,ZESTRIL) 40 MG tablet Take 20 mg by mouth daily.     No facility-administered medications prior to visit.     No Known Allergies  ROS As per HPI  PE: Blood pressure (!) 95/58, pulse (!) 116,  temperature 98.1 F (36.7 C), temperature source Oral, resp. rate 18, height '5\' 9"'  (1.753 m), weight 280 lb 12.8 oz (127.4 kg), SpO2 95 %. 2L oxygen [place]. Gen: Alert, well appearing.  Patient is oriented to person, place, time, and situation. AFFECT: pleasant, lucid thought and speech. CV: Regular, tachy to 115, distant S1 and S2, no audible m/r/g. Chest is clear, no wheezing or rales. Normal symmetric air entry throughout both lung fields. No chest wall deformities or tenderness. EXT: no clubbing or cyanosis.  Trace pitting edema bilat LL's.    LABS:  Lab Results  Component Value Date   TSH 3.04 08/30/2010   Lab Results  Component Value Date   WBC 5.3 07/06/2016   HGB 11.9 (L) 07/06/2016   HCT 37.0 (L) 07/06/2016   MCV 85.1 07/06/2016   PLT 171 07/06/2016   Lab Results  Component Value Date   CREATININE 1.0 07/06/2016   BUN 17.9 07/06/2016   NA 134 (L) 07/06/2016   K 4.9 07/06/2016   CL 99 05/07/2016   CO2 23 07/06/2016   Lab Results  Component Value Date   ALT 22 07/06/2016   AST 24 07/06/2016   ALKPHOS 48 07/06/2016   BILITOT 0.53 07/06/2016   Lab Results  Component Value Date   CHOL 108 (L) 03/12/2016   Lab Results  Component Value Date   HDL 30 (L) 03/12/2016   Lab Results  Component Value Date   LDLCALC 41 03/12/2016   Lab Results  Component Value Date   TRIG 184 (H) 03/12/2016   Lab Results  Component Value Date   CHOLHDL 3.6 03/12/2016   POCT HbA1c today: 4.8% today  IMPRESSION AND PLAN:  1) DM 2: good control.  PO intake consistently down due to dysphagia/cancer treatments. POCT HbA1c today: 4.8%. Lower NPH dosing to 90 U qAM and 30 U qPM. Lower humalog dosing to 10 U qAC.  2) Hypotension: has hx of HTN and diast CHF: his bp has been consistently low lately at MD visits. His lisinopril has already been cut to 1/2 of 59m tab per day.  I will d/c this med today until he his bp is consistently normal/high normal again.  3) Chronic diastolic  CHF: appears euvolemic.  His wt is down 38 lbs since I saw him 4 mo ago. This is due to poor PO intake of calories more than anything else. For now, will keep diuretic dosing same, but as chemo treatments go on and he gets BMET monitoring he may need to have his demadex dosing lowered.  4) Prev health care: high dose flu vaccine given today.  5) Esophageal cancer, stage 4: s/p palliative radiation tx. Now starting palliative chemotherapy. Tolerated taxol infusion x 1 well.  6) COPD w/obesity hypoventilation syndrome: stable.  The current medical regimen is effective;  continue present plan and medications.  An After Visit Summary was printed and given to the patient.  FOLLOW UP: Return in about 2 weeks (around 07/24/2016) for f/u glucoses and BPs and fluid status.  Signed:  Crissie Sickles, MD           07/10/2016

## 2016-07-13 ENCOUNTER — Other Ambulatory Visit (HOSPITAL_BASED_OUTPATIENT_CLINIC_OR_DEPARTMENT_OTHER): Payer: Medicare Other

## 2016-07-13 ENCOUNTER — Ambulatory Visit (HOSPITAL_BASED_OUTPATIENT_CLINIC_OR_DEPARTMENT_OTHER): Payer: Medicare Other

## 2016-07-13 ENCOUNTER — Ambulatory Visit (HOSPITAL_BASED_OUTPATIENT_CLINIC_OR_DEPARTMENT_OTHER): Payer: Medicare Other | Admitting: Hematology

## 2016-07-13 ENCOUNTER — Encounter: Payer: Self-pay | Admitting: Hematology

## 2016-07-13 ENCOUNTER — Ambulatory Visit: Payer: Medicare Other | Admitting: Nutrition

## 2016-07-13 VITALS — BP 118/66 | HR 123 | Temp 98.0°F | Resp 17 | Ht 69.0 in | Wt 287.5 lb

## 2016-07-13 DIAGNOSIS — C160 Malignant neoplasm of cardia: Secondary | ICD-10-CM | POA: Diagnosis not present

## 2016-07-13 DIAGNOSIS — E119 Type 2 diabetes mellitus without complications: Secondary | ICD-10-CM | POA: Diagnosis not present

## 2016-07-13 DIAGNOSIS — I10 Essential (primary) hypertension: Secondary | ICD-10-CM | POA: Diagnosis not present

## 2016-07-13 DIAGNOSIS — I5032 Chronic diastolic (congestive) heart failure: Secondary | ICD-10-CM

## 2016-07-13 DIAGNOSIS — Z5111 Encounter for antineoplastic chemotherapy: Secondary | ICD-10-CM | POA: Diagnosis not present

## 2016-07-13 DIAGNOSIS — R1310 Dysphagia, unspecified: Secondary | ICD-10-CM

## 2016-07-13 LAB — COMPREHENSIVE METABOLIC PANEL
ALBUMIN: 3.2 g/dL — AB (ref 3.5–5.0)
ALK PHOS: 62 U/L (ref 40–150)
ALT: 36 U/L (ref 0–55)
AST: 36 U/L — AB (ref 5–34)
Anion Gap: 12 mEq/L — ABNORMAL HIGH (ref 3–11)
BUN: 21.4 mg/dL (ref 7.0–26.0)
CHLORIDE: 100 meq/L (ref 98–109)
CO2: 25 mEq/L (ref 22–29)
Calcium: 9.2 mg/dL (ref 8.4–10.4)
Creatinine: 1.1 mg/dL (ref 0.7–1.3)
EGFR: 71 mL/min/{1.73_m2} — AB (ref >90)
GLUCOSE: 115 mg/dL (ref 70–140)
POTASSIUM: 4.5 meq/L (ref 3.5–5.1)
SODIUM: 136 meq/L (ref 136–145)
Total Bilirubin: 0.69 mg/dL (ref 0.20–1.20)
Total Protein: 7.2 g/dL (ref 6.4–8.3)

## 2016-07-13 LAB — CBC WITH DIFFERENTIAL/PLATELET
BASO%: 0.8 % (ref 0.0–2.0)
BASOS ABS: 0 10*3/uL (ref 0.0–0.1)
EOS ABS: 0.2 10*3/uL (ref 0.0–0.5)
EOS%: 6.1 % (ref 0.0–7.0)
HCT: 37.6 % — ABNORMAL LOW (ref 38.4–49.9)
HEMOGLOBIN: 12.2 g/dL — AB (ref 13.0–17.1)
LYMPH%: 10.9 % — AB (ref 14.0–49.0)
MCH: 27.1 pg — AB (ref 27.2–33.4)
MCHC: 32.4 g/dL (ref 32.0–36.0)
MCV: 83.5 fL (ref 79.3–98.0)
MONO#: 0.7 10*3/uL (ref 0.1–0.9)
MONO%: 18.3 % — AB (ref 0.0–14.0)
NEUT#: 2.3 10*3/uL (ref 1.5–6.5)
NEUT%: 63.9 % (ref 39.0–75.0)
Platelets: 217 10*3/uL (ref 140–400)
RBC: 4.5 10*6/uL (ref 4.20–5.82)
RDW: 16.1 % — AB (ref 11.0–14.6)
WBC: 3.6 10*3/uL — ABNORMAL LOW (ref 4.0–10.3)
lymph#: 0.4 10*3/uL — ABNORMAL LOW (ref 0.9–3.3)

## 2016-07-13 MED ORDER — DIPHENHYDRAMINE HCL 50 MG/ML IJ SOLN
50.0000 mg | Freq: Once | INTRAMUSCULAR | Status: AC
  Administered 2016-07-13: 50 mg via INTRAVENOUS

## 2016-07-13 MED ORDER — FAMOTIDINE IN NACL 20-0.9 MG/50ML-% IV SOLN
20.0000 mg | Freq: Once | INTRAVENOUS | Status: AC
  Administered 2016-07-13: 20 mg via INTRAVENOUS

## 2016-07-13 MED ORDER — SODIUM CHLORIDE 0.9 % IV SOLN
80.0000 mg/m2 | Freq: Once | INTRAVENOUS | Status: AC
  Administered 2016-07-13: 204 mg via INTRAVENOUS
  Filled 2016-07-13: qty 34

## 2016-07-13 MED ORDER — PALONOSETRON HCL INJECTION 0.25 MG/5ML
0.2500 mg | Freq: Once | INTRAVENOUS | Status: AC
  Administered 2016-07-13: 0.25 mg via INTRAVENOUS

## 2016-07-13 MED ORDER — FAMOTIDINE IN NACL 20-0.9 MG/50ML-% IV SOLN
INTRAVENOUS | Status: AC
  Filled 2016-07-13: qty 50

## 2016-07-13 MED ORDER — SODIUM CHLORIDE 0.9 % IV SOLN
214.0500 mg | Freq: Once | INTRAVENOUS | Status: AC
  Administered 2016-07-13: 210 mg via INTRAVENOUS
  Filled 2016-07-13: qty 21

## 2016-07-13 MED ORDER — SODIUM CHLORIDE 0.9 % IV SOLN
10.0000 mg | Freq: Once | INTRAVENOUS | Status: AC
  Administered 2016-07-13: 10 mg via INTRAVENOUS
  Filled 2016-07-13: qty 1

## 2016-07-13 MED ORDER — PALONOSETRON HCL INJECTION 0.25 MG/5ML
INTRAVENOUS | Status: AC
  Filled 2016-07-13: qty 5

## 2016-07-13 MED ORDER — SODIUM CHLORIDE 0.9 % IV SOLN
Freq: Once | INTRAVENOUS | Status: AC
  Administered 2016-07-13: 13:00:00 via INTRAVENOUS

## 2016-07-13 MED ORDER — DIPHENHYDRAMINE HCL 50 MG/ML IJ SOLN
INTRAMUSCULAR | Status: AC
  Filled 2016-07-13: qty 1

## 2016-07-13 NOTE — Progress Notes (Signed)
Nutrition follow-up completed with patient receiving treatment for cancer of the cardioesophageal junction. Weight today was documented as 287 pounds September 15 decreased from 295.7 pounds September 8. Patient reports he had one episode of nausea yesterday which was resolved with a nausea medication. He is having difficulty swallowing meats but can tolerate most other foods. He is drinking boost daily.  Nutrition diagnosis: Inadequate oral intake continues.  Intervention: Patient educated to continue boost 2-3 times daily when intake is poor. Discouraged weight loss during treatment. Encouraged patient to consume high-protein foods and plant-based diet as tolerated. Provided coupons for oral nutrition supplements. Teach back method used.  Monitoring, evaluation, goals:  Patient will tolerate increased calories and protein to minimize weight loss.  Next visit: To be scheduled as needed.  **Disclaimer: This note was dictated with voice recognition software. Similar sounding words can inadvertently be transcribed and this note may contain transcription errors which may not have been corrected upon publication of note.**

## 2016-07-13 NOTE — Patient Instructions (Signed)
[facility] Discharge Instructions for Patients Receiving Chemotherapy  Today you received the following chemotherapy agents: Taxol & Carboplatin.  To help prevent nausea and vomiting after your treatment, we encourage you to take your nausea medication: Zofran. Take one every 8 hours as needed. You may also take Compazine every 6 hours for nausea not relieved by Zofran.   If you develop nausea and vomiting that is not controlled by your nausea medication, call the clinic.   BELOW ARE SYMPTOMS THAT SHOULD BE REPORTED IMMEDIATELY:  *FEVER GREATER THAN 100.5 F  *CHILLS WITH OR WITHOUT FEVER  NAUSEA AND VOMITING THAT IS NOT CONTROLLED WITH YOUR NAUSEA MEDICATION  *UNUSUAL SHORTNESS OF BREATH  *UNUSUAL BRUISING OR BLEEDING  TENDERNESS IN MOUTH AND THROAT WITH OR WITHOUT PRESENCE OF ULCERS  *URINARY PROBLEMS  *BOWEL PROBLEMS  UNUSUAL RASH Items with * indicate a potential emergency and should be followed up as soon as possible.  Feel free to call the clinic should you have any questions or concerns. The clinic phone number is [phone].  Please show the [place] at check-in to the Emergency Department and triage nurse.

## 2016-07-13 NOTE — Progress Notes (Signed)
[facility]  Telephone:([phone]) [phone] Fax:(336) [phone]  Clinic Follow up Note   Patient Care Team: Tammi Sou, MD as PCP - General (Family Medicine) Ulyses Southward, MD as Consulting Physician (Allergy and Immunology) Calton Dach, MD as Consulting Physician (Optometry) Sueanne Margarita, MD as Consulting Physician (Cardiology) Jerene Bears, MD as Consulting Physician (Gastroenterology) Latanya Maudlin, MD as Consulting Physician (Orthopedic Surgery) Jodi Marble, MD as Consulting Physician (Otolaryngology) Truitt Merle, MD as Consulting Physician (Hematology) Kyung Rudd, MD as Consulting Physician (Radiation Oncology) 07/13/2016    CHIEF COMPLAINTS:  Follow up GE junction cancer  Oncology History   Cancer of cardio-esophageal junction [facility])   Staging form: Stomach, AJCC 7th Edition     Clinical stage from 05/04/2016: Stage IV (TX, N2, M1) - Signed by Truitt Merle, MD on 05/18/2016        Cancer of cardio-esophageal junction ([place])   05/04/2016 Initial Diagnosis    Cancer of cardio-esophageal junction ([place])      05/04/2016 Procedure    EGD showed a large ulcerating mass with no active bleeding at the gastroesophageal junction extending into the gastric cardia. The mass was not obstructing and partially circumferential, extends approximately 5 cm. Nonbleeding erosive gastropathy.      05/04/2016 Initial Biopsy    Esophageal gastric junction biopsy showed a poorly differentiated carcinoma underlying the squamous mucosa. There is lymphovascular invasion. No Intestinal metaplasia. IHC weakly positive CK5/6, p63 (-), favor squamous.        05/09/2016 Imaging    CT CAP w contrast showed mild wall thickening involving the distal esophagus and proximal stomach compatible with known cancer, enlarged mediastinal and upper abdominal lymph nodes are highly suspicious for metastatic adenopathy, propable liver cirrhosis.      06/04/2016 - 06/22/2016 Radiation Therapy   palliative radiation to esophageal cancer        HISTORY OF PRESENTING ILLNESS:  [name] 70 y.o. male is here because of His reason that diagnosed GE junction carcinoma. He is a comment of by his wife and daughter to our multidisciplinary chart clinic today.  He has been having progressive dysphagia and odynophagia for 2 month, he has been eating soft diet only in the past one week. He has pain in the mid chest and epigastric area only when he eats, with burning sensation, no nausea, abdominal bloating, or other discomfort. His appetite has remained well, no weight loss,   He has multiple medical problems, especially heart failure, he has been on oxygen continuously for 5-6 years. He is morbidly obese, has diabetes, hypertension, mild neuropathy, OSA etc. He has very sedentary life style, he sits in the chair and watch TV most of time during the day. He only comes out for shopping for a few times a week. He uses a Barr[employer], he can walk for a few hundrends feet before he has to stop to catch his breasts. He denies cough or sputum production, no GI discomfort, he has been having mild dirrhea, loose stool, twice daily, no melena or hematochezia.  CURRENT THERAPY: chemotherapy with weekly carboplatin and taxol, starting on 07/06/2016  INTERIM HISTORY: Mr. Romig returns for follow-up and second week chemo. He tolerated the first cycle chemotherapy well overall. He only had 1 episode of nausea yesterday, resolved after taking Zofran. He had a worsening dyspnea last week, due to the fluid overloaded, he was out of her Lasix for 2 days. He was seen by his primary care physician 3  days ago, he states in a poor has been held, and the insulin dose was reduced, due to his last by mouth intake. No other new complaints. His epigastric pain is stable, slightly improved, he takes tramadol as needed before meal.   MEDICAL HISTORY:  Past Medical History:  Diagnosis Date  . Asthma    as a child    . Cataract    multiple types, bilateral  . Chronic diastolic CHF (congestive heart failure) ([place]) 02/25/2009  . Chronic renal insufficiency, stage III (moderate) 2017   Stage II/III (GFR around 60)  . 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 COPD (chronic obstructive pulmonary disease) ([place])   . Coronary artery disease    chronically occluded LAD and diagonal with right to left collaterals, mild disease in the left circ and moderate disease in the mid RCA on medical management with Imdur, ASA, and Plavix.  . Dermatitis 05/2014  . DIABETES MELLITUS, TYPE II 06/24/2007   No diab retpthy as of 08/05/15 eye exam.  . 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 Pyrtle--EGD).  CT C/A/P showed metastatic adenopathy in mediastinum and upper abdomen 05/09/16.  Tx plan is palliative radiation (completed 06/22/16), then palliative systemic chemotherapy after he recovers from radiation.  . Essential hypertension 05/01/2007   Qualifier: Diagnosis of  By: Tiney Rouge CMA, Ellison Hughs     . History of kidney stones   . Hyperkalemia 12/2015   Decreased ACE-I by 50% in response, then potassium normalized.  Marland Kitchen HYPERLIPIDEMIA 06/24/2007  . HYPERTENSION 05/01/2007  . Hypoxemia 01/27/2010  . Myocardial infarction [facility])    pt states he was informed per MD that he has had one but pt was unaware   . OBESITY 09/02/2008  . Obesity hypoventilation syndrome (HCC)    oxygen 24/7 (2 liters [place] as of 02/2016)  . On home oxygen therapy    Oxygen @ 2l/m nasally 24/7 hours  . OSA (obstructive sleep apnea)    not tested; pt scored 4 per stop bang tool results sent to PCP   . OSTEOARTHRITIS 05/01/2007  . Presbycusis of both ears 05/2015   [facility]  . Pruritic condition 05/2014   Allergist summer 2015, no new testing.  [name] field defect 05/10/16   Per Dr. Melina Fiddler, O.D.: Rt, loss of inf/temp quad and some loss sup/temp.  ?CVA  ? Pituitary tumor? ?Brain  met.    SURGICAL HISTORY: Past Surgical History:  Procedure Laterality Date  . APPENDECTOMY    . BALLOON DILATION N/A 05/04/2016   Procedure: BALLOON DILATION;  Surgeon: Jerene Bears, MD;  Location: [facility];  Service: Gastroenterology;  Laterality: N/A;  . CARDIAC CATHETERIZATION    . CARPAL TUNNEL RELEASE Left   . CATARACT EXTRACTION W/PHACO Right 04/29/2013   Procedure: CATARACT EXTRACTION PHACO AND INTRAOCULAR LENS PLACEMENT (IOC);  Surgeon: Adonis Brook, MD;  Location: [facility];  Service: Ophthalmology;  Laterality: Right;  . CATARACT EXTRACTION, BILATERAL    . COLONOSCOPY W/ POLYPECTOMY  08/2011   Many polyps--all hyperplastic, severe diverticulosis, int hem.  [employer] hemoccult testing via [facility] 06/22/15 NEG  . ESOPHAGOGASTRODUODENOSCOPY N/A 05/04/2016   Procedure: ESOPHAGOGASTRODUODENOSCOPY (EGD);  Surgeon: Jerene Bears, MD;  Location: [facility];  Service: Gastroenterology;  Laterality: N/A;  . KNEE ARTHROSCOPY WITH MEDIAL MENISECTOMY Right 12/16/2014   Procedure: RIGHT KNEE ARTHROSCOPY WITH MEDIAL MENISECTOMY microfracture medial femoral condyle abrasion condroplasty medial femoral condyle lateral  menisectomy;  Surgeon: Tobi Bastos, MD;  Location: [facility];  Service: Orthopedics;  Laterality: Right;  . LEFT HEART CATHETERIZATION WITH CORONARY ANGIOGRAM N/A 10/26/2013   Procedure: LEFT HEART CATHETERIZATION WITH CORONARY ANGIOGRAM;  Surgeon: Jettie Booze, MD;  Location[facility] CATH LAB;  Service: Cardiovascular;  Laterality: N/A;  . LUMBAR [place] SURGERY  2001  . SHOULDER SURGERY Right    right fx  . TONSILLECTOMY    . TRANSTHORACIC ECHOCARDIOGRAM  03/2016   EF 55-60%, grade I DD, LAE  . WRIST SURGERY Right    right fx    SOCIAL HISTORY: Social History   Social History  . Marital status: Married    Spouse name: susan  . Number of children: 2  . Years of education: N/A   Occupational History  . Retired    Social History Main Topics  . Smoking status: Former Smoker      Packs/day: 1.50    Years: 30.00    Types: Cigarettes, Pipe, Cigars    Quit date: 10/30/1983  . Smokeless tobacco: Former Systems developer    Types: Chew    Quit date: 05/15/2016  . Alcohol use No  . Drug use: No  . Sexual activity: Not on file   Other Topics Concern  . Not on file   Social History Narrative   Married, one son and one daughter.   His daughter and her two children live with him.   Coffee daily.  Former smoker.  No alcohol.   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   Attends church weekly-   Oxygen continuous       FAMILY HISTORY: Family History  Problem Relation Age of Onset  . Heart attack Mother   . Aneurysm Father   . Alcohol abuse Father   . Diabetes Maternal Aunt   . Colon cancer Neg Hx     ALLERGIES:  has No Known Allergies.  MEDICATIONS:  Current Outpatient Prescriptions  Medication Sig Dispense Refill  . aspirin 81 MG tablet Take 81 mg by mouth daily.      Marland Kitchen atorvastatin (LIPITOR) 80 MG tablet Take 1 tablet (80 mg total) by mouth daily. 90 tablet 3  . beta carotene w/minerals (OCUVITE) tablet Take 1 tablet by mouth daily.    . cetirizine (ZYRTEC) 10 MG tablet Take 10 mg by mouth daily.    . citalopram (CELEXA) 20 MG tablet Take 1 tablet (20 mg total) by mouth daily. 90 tablet 3  . clonazePAM (KLONOPIN) 1 MG tablet TAKE ONE TABLET BY MOUTH TWICE DAILY AS NEEDED FOR ANXIETY 60 tablet 5  . cromolyn (OPTICROM) 4 % ophthalmic solution Place 1 drop into both eyes 4 (four) times daily.    Marland Kitchen diltiazem (CARDIZEM CD) 120 MG 24 hr capsule TAKE ONE CAPSULE BY MOUTH ONCE DAILY 30 capsule 5  . ezetimibe (ZETIA) 10 MG tablet Take 1 tablet (10 mg total) by mouth daily. 30 tablet 11  . gabapentin (NEURONTIN) 300 MG capsule TAKE ONE CAPSULE BY MOUTH THREE TIMES DAILY 270 capsule 3  . glucose blood (ONE TOUCH ULTRA TEST) test strip TEST 3 TIMES A DAY AS DIRECTED 100 each 11  . hydrOXYzine (ATARAX/VISTARIL) 25 MG tablet 1 tab at supper and 1 at  bedtime (for insomnia) 60 tablet 11  . Insulin Lispro (HUMALOG KWIKPEN) 200 UNIT/ML SOPN Inject 10 Units into the skin 3 (three) times daily before meals. 15 mL 6  . Insulin NPH, Human,, Isophane, (HUMULIN N  KWIKPEN) 100 UNIT/ML Kiwkpen 90 U SQ qAM and 30 U SQ qSupper 60 mL 6  . isosorbide mononitrate (IMDUR) 60 MG 24 hr tablet Take 1 tablet (60 mg total) by mouth at bedtime. 30 tablet 11  . metFORMIN (GLUCOPHAGE) 1000 MG tablet TAKE ONE TABLET BY MOUTH TWICE DAILY WITH MEALS 60 tablet 6  . Omega-3 Fatty Acids ([employer] FISH OIL) 1000 MG CAPS Take 2 tablets by mouth 2 (two) times daily. 90 capsule   . ondansetron (ZOFRAN) 8 MG tablet Take 1 tablet (8 mg total) by mouth 2 (two) times daily as needed for refractory nausea / vomiting. Start on day 3 after chemo. 30 tablet 1  . ONETOUCH DELICA LANCETS 65K MISC 1 each by Other route 3 (three) times daily. USE TO TEST 3 TIMES A DAY 100 each 11  . OXYGEN Inhale 2 L/min into the lungs continuous.    . pantoprazole (PROTONIX) 40 MG tablet Take 1 tablet (40 mg total) by mouth 2 (two) times daily. 60 tablet 3  . prochlorperazine (COMPAZINE) 10 MG tablet Take 1 tablet (10 mg total) by mouth every 6 (six) hours as needed (Nausea or vomiting). 30 tablet 1  . torsemide (DEMADEX) 20 MG tablet Take 1 tablet (20 mg total) by mouth daily. Take extra tab as directed for weight gain. 45 tablet 11  . traMADol (ULTRAM) 50 MG tablet Take 1 tablet (50 mg total) by mouth every 8 (eight) hours as needed. 60 tablet 1   No current facility-administered medications for this visit.     REVIEW OF SYSTEMS:   Constitutional: Denies fevers, chills or abnormal night sweats   Eyes: Denies blurriness of vision, double vision or watery eyes Ears, nose, mouth, throat, and face: Denies mucositis or sore throat Respiratory: Denies cough, dyspnea or wheezes Cardiovascular: Denies palpitation, chest discomfort or lower extremity swelling Gastrointestinal:  Denies nausea, heartburn or change  in bowel habits Skin: Denies abnormal skin rashes Lymphatics: Denies new lymphadenopathy or easy bruising Neurological:Denies numbness, tingling or new weaknesses Behavioral/Psych: Mood is stable, no new changes  All other systems were reviewed with the patient and are negative.  PHYSICAL EXAMINATION: ECOG PERFORMANCE STATUS: 3 - Symptomatic, >50% confined to bed  Vitals:   07/13/16 1133  BP: 118/66  Pulse: (!) 123  Resp: 17  Temp: 98 F (36.7 C)   Filed Weights   07/13/16 1133  Weight: 287 lb 8 oz (130.4 kg)    GENERAL:alert, no distress and comfortable, morbid obese, sitting in wheelchair with nasal cannula oxygen SKIN: skin color, texture, turgor are normal, no rashes or significant lesions EYES: normal, conjunctiva are pink and non-injected, sclera clear OROPHARYNX:no exudate, no erythema and lips, buccal mucosa, and tongue normal  NECK: supple, thyroid normal size, non-tender, without nodularity LYMPH:  no palpable lymphadenopathy in the cervical, axillary or inguinal LUNGS: (+) Scatter rales on bilateral lung base, no wheezing HEART: regular rate & rhythm and no murmurs and no lower extremity edema ABDOMEN:abdomen soft, non-tender and normal bowel sounds Musculoskeletal:no cyanosis of digits and no clubbing  PSYCH: alert & oriented x 3 with fluent speech NEURO: no focal motor/sensory deficits EXT: Trace edema, he wears compression stocks  LABORATORY DATA:  I have reviewed the data as listed CBC Latest Ref Rng & Units 07/13/2016 07/06/2016 06/25/2016  WBC 4.0 - 10.3 10e3/uL 3.6(L) 5.3 6.2  Hemoglobin 13.0 - 17.1 g/dL 12.2(L) 11.9(L) 12.6(L)  Hematocrit 38.4 - 49.9 % 37.6(L) 37.0(L) 39.1  Platelets 140 - 400 10e3/uL 217 171  132(L)   CMP Latest Ref Rng & Units 07/13/2016 07/06/2016 06/25/2016  Glucose 70 - 140 mg/dl 115 152(H) 81  BUN 7.0 - 26.0 mg/dL 21.4 17.9 14.4  Creatinine 0.7 - 1.3 mg/dL 1.1 1.0 0.9  Sodium 136 - 145 mEq/L 136 134(L) 139  Potassium 3.5 - 5.1 mEq/L  4.5 4.9 4.6  Chloride 96 - 112 mEq/L - - -  CO2 22 - 29 mEq/L 25 23 30(H)  Calcium 8.4 - 10.4 mg/dL 9.2 9.0 9.3  Total Protein 6.4 - 8.3 g/dL 7.2 6.8 7.3  Total Bilirubin 0.20 - 1.20 mg/dL 0.69 0.53 0.48  Alkaline Phos 40 - 150 U/L 62 48 51  AST 5 - 34 U/L 36(H) 24 22  ALT 0 - 55 U/L 36 22 19    PATHOLOGY REPORT Diagnosis 05/04/2016 1. Esophagogastric junction, biopsy, ulcerative mass - POORLY DIFFERENTIATED CARCINOMA, SEE COMMENT. 2. Stomach, biopsy - REACTIVE GASTROPATHY. - NEGATIVE FOR HELICOBACTER PYLORI. - NO INTESTINAL METAPLASIA, DYSPLASIA, OR MALIGNANCY. Microscopic Comment 1. The majority of the specimen consists of gastroesophageal mucosa with reflux changes. There is a small focus of tumor underlying the squamous mucosa. There is lymphovascular invasion. There is no background intestinal metaplasia (Barrett's esophagus). Immunohistochemistry reveals only very focal weak cytokeratin 5/6, negative p63, and negative mucicarmine in the limited remaining tumor. Dr. Saralyn Pilar has reviewed the case. The case was called to Dr. Hilarie Fredrickson on 05/08/2016. 2. A Warthin-Starry stain is performed to determine the possibility of the presence of Helicobacter pylori. The Warthin-Starry stain is negative for organisms of Helicobacter pylori.  RADIOGRAPHIC STUDIES: I have personally reviewed the radiological images as listed and agreed with the findings in the report. No results found. EGD 05/04/2016 A large, ulcerating mass with no active bleeding and no stigmata of recent bleeding was found at the gastroesophageal junction extending into the gastric cardia, beginning 40 cm from the incisors. The mass was non-obstructing and partially circumferential (involving one-half of the lumen circumference). The mass extends approximately 5 cm. IMPRESSION:  -Likely malignant esophageal tumor was found at the gastroesophageal junction extending into the gastric cardia. Biopsied. - Non-bleeding erosive  gastropathy. Biopsied. - Erythematous duodenopathy. - Normal second portion of the duodenum.  ASSESSMENT & PLAN: 70 year old Caucasian male, with multiple comorbidities, including hypertension, diabetes, OSA, coronary artery disease, diastolic CHF, on continuous oxygen, morbid obesity, presented with progressive dysphagia and odynophagia.  1. Cancer of cardio-esophageal junction, poorly differentiated, cTxN2M1, stage IV  -I reviewed his CT scan, EGD, and the biopsy results with patient and his family members in great details. -His biopsy pathology was reviewed in our tumor board 2 days ago, the morphology and weakly positive for CK 5/6, fevers squamous cell carcinoma -His CT scan findings are very concerning for distant metastasis to abdominal lymph nodes.  -I reviewed the PET scan images with patient and his daughter in person today, he has hypermetabolic GE junction tumor, and diffuse adenopathy in mediastinum and upper abdomen. -We discussed the aggressive nature of esophageal cancer, an incurable nature of his disease due to the distant metastasis. The goal of therapy is palliative, to prolong his life and palliate his symptoms. -He has completed palliative radiation -He tolerates the first week chemotherapy with Taxol well overall, left reviewed, adequate for treatment, we'll proceed to week 2 treatment with carboplatin AUC 1.5 and Taxol   2. CAD, diastolic CHF, on continuous oxygen -He will continue follow-up with his cardiologist Dr. Radford Pax -His Plavix has been held since the EGD and biopsy, he is on  baby aspirin. -Need to watch his fluid intake during his chemotherapy, and also avoid fluid overload from chemo  -he is back on lasix, dsypnea improved   3. DM, HTN -We discussed his blood pressure and glucose need to be monitored closely during the chemotherapy, which may affect his blood glucose and blood pressure -He will continue medication, monitor his sugar and blood pressure at home,  and follow-up with his primary care physician -will decrease premed dexa before taxol  -History simple has been held due to his borderline low blood pressure -His insulin has been decreased due to decreased by mouth intake -She'll continue follow-up with his primary care physician  4. OSA, morbid obesity -We discussed healthy diet, I encouraged him to to be more physically active, and consider home PT -He will be seen by our dietitian Pamala Hurry today. I strongly encouraged him to follow-up with dietitian during the therapy for nutrition support.  5. Epigastric pain -Likely secondary to his underlying malignancy -worse after radiation -continue tramadol as need    Plan -week 2 chemotherapy today, with carbo and taxol -Return to clinic next week for week 3 treatment, he will be off chemo on week 4   All questions were answered. The patient knows to call the clinic with any problems, questions or concerns. I spent 20 minutes counseling the patient face to face. The total time spent in the appointment was 25 minutes and more than 50% was on counseling.     Truitt Merle, MD 07/13/2016

## 2016-07-20 ENCOUNTER — Other Ambulatory Visit (HOSPITAL_BASED_OUTPATIENT_CLINIC_OR_DEPARTMENT_OTHER): Payer: Medicare Other

## 2016-07-20 ENCOUNTER — Encounter: Payer: Self-pay | Admitting: Hematology

## 2016-07-20 ENCOUNTER — Ambulatory Visit (HOSPITAL_BASED_OUTPATIENT_CLINIC_OR_DEPARTMENT_OTHER): Payer: Medicare Other

## 2016-07-20 ENCOUNTER — Ambulatory Visit (HOSPITAL_BASED_OUTPATIENT_CLINIC_OR_DEPARTMENT_OTHER): Payer: Medicare Other | Admitting: Hematology

## 2016-07-20 VITALS — BP 111/56 | HR 109 | Temp 98.4°F | Resp 18 | Ht 69.0 in | Wt 288.5 lb

## 2016-07-20 DIAGNOSIS — C160 Malignant neoplasm of cardia: Secondary | ICD-10-CM

## 2016-07-20 DIAGNOSIS — I5030 Unspecified diastolic (congestive) heart failure: Secondary | ICD-10-CM

## 2016-07-20 DIAGNOSIS — I2510 Atherosclerotic heart disease of native coronary artery without angina pectoris: Secondary | ICD-10-CM

## 2016-07-20 DIAGNOSIS — Z5111 Encounter for antineoplastic chemotherapy: Secondary | ICD-10-CM

## 2016-07-20 DIAGNOSIS — I10 Essential (primary) hypertension: Secondary | ICD-10-CM

## 2016-07-20 DIAGNOSIS — E119 Type 2 diabetes mellitus without complications: Secondary | ICD-10-CM

## 2016-07-20 DIAGNOSIS — I5032 Chronic diastolic (congestive) heart failure: Secondary | ICD-10-CM

## 2016-07-20 LAB — COMPREHENSIVE METABOLIC PANEL
ALBUMIN: 2.8 g/dL — AB (ref 3.5–5.0)
ALK PHOS: 55 U/L (ref 40–150)
ALT: 39 U/L (ref 0–55)
AST: 34 U/L (ref 5–34)
Anion Gap: 11 mEq/L (ref 3–11)
BUN: 15.4 mg/dL (ref 7.0–26.0)
CALCIUM: 9 mg/dL (ref 8.4–10.4)
CO2: 25 mEq/L (ref 22–29)
CREATININE: 1.1 mg/dL (ref 0.7–1.3)
Chloride: 99 mEq/L (ref 98–109)
EGFR: 65 mL/min/{1.73_m2} — ABNORMAL LOW (ref >90)
Glucose: 170 mg/dl — ABNORMAL HIGH (ref 70–140)
Potassium: 3.5 mEq/L (ref 3.5–5.1)
Sodium: 135 mEq/L — ABNORMAL LOW (ref 136–145)
TOTAL PROTEIN: 6.4 g/dL (ref 6.4–8.3)
Total Bilirubin: 0.53 mg/dL (ref 0.20–1.20)

## 2016-07-20 LAB — CBC WITH DIFFERENTIAL/PLATELET
BASO%: 1.3 % (ref 0.0–2.0)
Basophils Absolute: 0 10*3/uL (ref 0.0–0.1)
EOS%: 4.5 % (ref 0.0–7.0)
Eosinophils Absolute: 0.1 10*3/uL (ref 0.0–0.5)
HEMATOCRIT: 31.5 % — AB (ref 38.4–49.9)
HEMOGLOBIN: 10.4 g/dL — AB (ref 13.0–17.1)
LYMPH#: 0.4 10*3/uL — AB (ref 0.9–3.3)
LYMPH%: 23.9 % (ref 14.0–49.0)
MCH: 27.7 pg (ref 27.2–33.4)
MCHC: 33 g/dL (ref 32.0–36.0)
MCV: 83.8 fL (ref 79.3–98.0)
MONO#: 0.3 10*3/uL (ref 0.1–0.9)
MONO%: 18.1 % — ABNORMAL HIGH (ref 0.0–14.0)
NEUT#: 0.8 10*3/uL — ABNORMAL LOW (ref 1.5–6.5)
NEUT%: 52.2 % (ref 39.0–75.0)
Platelets: 145 10*3/uL (ref 140–400)
RBC: 3.76 10*6/uL — ABNORMAL LOW (ref 4.20–5.82)
RDW: 15.9 % — AB (ref 11.0–14.6)
WBC: 1.6 10*3/uL — ABNORMAL LOW (ref 4.0–10.3)

## 2016-07-20 MED ORDER — FAMOTIDINE IN NACL 20-0.9 MG/50ML-% IV SOLN
20.0000 mg | Freq: Once | INTRAVENOUS | Status: AC
  Administered 2016-07-20: 20 mg via INTRAVENOUS

## 2016-07-20 MED ORDER — SODIUM CHLORIDE 0.9 % IV SOLN
10.0000 mg | Freq: Once | INTRAVENOUS | Status: AC
  Administered 2016-07-20: 10 mg via INTRAVENOUS
  Filled 2016-07-20: qty 1

## 2016-07-20 MED ORDER — SODIUM CHLORIDE 0.9 % IV SOLN
80.0000 mg/m2 | Freq: Once | INTRAVENOUS | Status: AC
  Administered 2016-07-20: 204 mg via INTRAVENOUS
  Filled 2016-07-20: qty 34

## 2016-07-20 MED ORDER — DIPHENHYDRAMINE HCL 50 MG/ML IJ SOLN
50.0000 mg | Freq: Once | INTRAMUSCULAR | Status: AC
  Administered 2016-07-20: 50 mg via INTRAVENOUS

## 2016-07-20 MED ORDER — PACLITAXEL CHEMO INJECTION 300 MG/50ML
80.0000 mg/m2 | Freq: Once | INTRAVENOUS | Status: DC
  Filled 2016-07-20: qty 34

## 2016-07-20 MED ORDER — DIPHENHYDRAMINE HCL 50 MG/ML IJ SOLN
INTRAMUSCULAR | Status: AC
  Filled 2016-07-20: qty 1

## 2016-07-20 MED ORDER — SODIUM CHLORIDE 0.9 % IV SOLN
Freq: Once | INTRAVENOUS | Status: AC
  Administered 2016-07-20: 15:00:00 via INTRAVENOUS

## 2016-07-20 MED ORDER — FAMOTIDINE IN NACL 20-0.9 MG/50ML-% IV SOLN
INTRAVENOUS | Status: AC
  Filled 2016-07-20: qty 50

## 2016-07-20 MED ORDER — PALONOSETRON HCL INJECTION 0.25 MG/5ML
INTRAVENOUS | Status: AC
  Filled 2016-07-20: qty 5

## 2016-07-20 MED ORDER — SODIUM CHLORIDE 0.9% FLUSH
10.0000 mL | INTRAVENOUS | Status: DC | PRN
  Filled 2016-07-20: qty 10

## 2016-07-20 MED ORDER — ONDANSETRON HCL 40 MG/20ML IJ SOLN
Freq: Once | INTRAMUSCULAR | Status: AC
  Administered 2016-07-20: 15:00:00 via INTRAVENOUS
  Filled 2016-07-20: qty 8

## 2016-07-20 NOTE — Progress Notes (Signed)
Per Jan RN ok to treat with ANC 0.8, patient to receive Taxol only ( no [place]) today.

## 2016-07-20 NOTE — Progress Notes (Signed)
[place]  Telephone:([phone]) [phone] [phone]) [phone]  Clinic Follow up Note   Patient Care Team: Tammi Sou, MD as PCP - General (Family Medicine) Ulyses Southward, MD as Consulting Physician (Allergy and Immunology) Calton Dach, MD as Consulting Physician (Optometry) Sueanne Margarita, MD as Consulting Physician (Cardiology) Jerene Bears, MD as Consulting Physician (Gastroenterology) Latanya Maudlin, MD as Consulting Physician (Orthopedic Surgery) Jodi Marble, MD as Consulting Physician (Otolaryngology) Truitt Merle, MD as Consulting Physician (Hematology) Kyung Rudd, MD as Consulting Physician (Radiation Oncology) 07/20/2016    CHIEF COMPLAINTS:  Follow up GE junction cancer  Oncology History   Cancer of cardio-esophageal junction [facility])   Staging form: Stomach, AJCC 7th Edition     Clinical stage from 05/04/2016: Stage IV (TX, N2, M1) - Signed by Truitt Merle, MD on 05/18/2016        Cancer of cardio-esophageal junction ([place])   05/04/2016 Initial Diagnosis    Cancer of cardio-esophageal junction ([place])      05/04/2016 Procedure    EGD showed a large ulcerating mass with no active bleeding at the gastroesophageal junction extending into the gastric cardia. The mass was not obstructing and partially circumferential, extends approximately 5 cm. Nonbleeding erosive gastropathy.      05/04/2016 Initial Biopsy    Esophageal gastric junction biopsy showed a poorly differentiated carcinoma underlying the squamous mucosa. There is lymphovascular invasion. No Intestinal metaplasia. IHC weakly positive CK5/6, p63 (-), favor squamous.        05/09/2016 Imaging    CT CAP w contrast showed mild wall thickening involving the distal esophagus and proximal stomach compatible with known cancer, enlarged mediastinal and upper abdominal lymph nodes are highly suspicious for metastatic adenopathy, propable liver cirrhosis.      06/04/2016 - 06/22/2016 Radiation Therapy   palliative radiation to esophageal cancer        HISTORY OF PRESENTING ILLNESS:  [name] 70 y.o. male is here because of His reason that diagnosed GE junction carcinoma. He is a comment of by his wife and daughter to our multidisciplinary chart clinic today.  He has been having progressive dysphagia and odynophagia for 2 month, he has been eating soft diet only in the past one week. He has pain in the mid chest and epigastric area only when he eats, with burning sensation, no nausea, abdominal bloating, or other discomfort. His appetite has remained well, no weight loss,   He has multiple medical problems, especially heart failure, he has been on oxygen continuously for 5-6 years. He is morbidly obese, has diabetes, hypertension, mild neuropathy, OSA etc. He has very sedentary life style, he sits in the chair and watch TV most of time during the day. He only comes out for shopping for a few times a week. He uses a Barr[employer]'s [employer], he can walk for a few hundrends feet before he has to stop to catch his breasts. He denies cough or sputum production, no GI discomfort, he has been having mild dirrhea, loose stool, twice daily, no melena or hematochezia.  CURRENT THERAPY: chemotherapy with weekly carboplatin and taxol, starting on 07/06/2016  INTERIM HISTORY: [name] returns for follow-up and third week chemo. He tolerated the second week chemotherapy with carboplatin and Taxol well overall. He did have a cold sensation, fatigue, low appetite and body aches for one day after chemotherapy, but he recovered quickly. His appetite has overall improved, his epigastric pain after eating has also improved. He has gained 76  pounds in the past few weeks. No worsening of his leg swollen or dyspnea. No fever or chills.   MEDICAL HISTORY:  Past Medical History:  Diagnosis Date  . Asthma    as a child  . Cataract    multiple types, bilateral  . Chronic diastolic CHF (congestive heart failure) ([place])  02/25/2009  . Chronic renal insufficiency, stage III (moderate) 2017   Stage II/III (GFR around 60)  . 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 COPD (chronic obstructive pulmonary disease) ([place])   . Coronary artery disease    chronically occluded LAD and diagonal with right to left collaterals, mild disease in the left circ and moderate disease in the mid RCA on medical management with Imdur, ASA, and Plavix.  . Dermatitis 05/2014  . DIABETES MELLITUS, TYPE II 06/24/2007   No diab retpthy as of 08/05/15 eye exam.  . Esophageal cancer ([place]) 04/2016   poorly differentiated carcinoma (Dr. Pyrtle--EGD).  CT C/A/P showed metastatic adenopathy in mediastinum and upper abdomen 05/09/16.  Tx plan is palliative radiation (completed 06/22/16), then palliative systemic chemotherapy after he recovers from radiation.  . Essential hypertension 05/01/2007   Qualifier: Diagnosis of  By: Tiney Rouge CMA, Ellison Hughs     . History of kidney stones   . Hyperkalemia 12/2015   Decreased ACE-I by 50% in response, then potassium normalized.  Marland Kitchen HYPERLIPIDEMIA 06/24/2007  . HYPERTENSION 05/01/2007  . Hypoxemia 01/27/2010  . Myocardial infarction [facility])    pt states he was informed per MD that he has had one but pt was unaware   . OBESITY 09/02/2008  . Obesity hypoventilation syndrome (HCC)    oxygen 24/7 (2 liters [place] as of 02/2016)  . On home oxygen therapy    Oxygen @ 2l/m nasally 24/7 hours  . OSA (obstructive sleep apnea)    not tested; pt scored 4 per stop bang tool results sent to PCP   . OSTEOARTHRITIS 05/01/2007  . Presbycusis of both ears 05/2015   [facility]  . Pruritic condition 05/2014   Allergist summer 2015, no new testing.  [name] field defect 05/10/16   Per Dr. Melina Fiddler, O.D.: Rt, loss of inf/temp quad and some loss sup/temp.  ?CVA  ? Pituitary tumor? ?Brain met.    SURGICAL HISTORY: Past Surgical History:  Procedure Laterality Date  . APPENDECTOMY    .  BALLOON DILATION N/A 05/04/2016   Procedure: BALLOON DILATION;  Surgeon: Jerene Bears, MD;  Location: [facility];  Service: Gastroenterology;  Laterality: N/A;  . CARDIAC CATHETERIZATION    . CARPAL TUNNEL RELEASE Left   . CATARACT EXTRACTION W/PHACO Right 04/29/2013   Procedure: CATARACT EXTRACTION PHACO AND INTRAOCULAR LENS PLACEMENT (IOC);  Surgeon: Adonis Brook, MD;  Location: [place];  Service: Ophthalmology;  Laterality: Right;  . CATARACT EXTRACTION, BILATERAL    . COLONOSCOPY W/ POLYPECTOMY  08/2011   Many polyps--all hyperplastic, severe diverticulosis, int hem.  [employer] hemoccult testing via [facility] 06/22/15 NEG  . ESOPHAGOGASTRODUODENOSCOPY N/A 05/04/2016   Procedure: ESOPHAGOGASTRODUODENOSCOPY (EGD);  Surgeon: Jerene Bears, MD;  Location: [facility];  Service: Gastroenterology;  Laterality: N/A;  . KNEE ARTHROSCOPY WITH MEDIAL MENISECTOMY Right 12/16/2014   Procedure: RIGHT KNEE ARTHROSCOPY WITH MEDIAL MENISECTOMY microfracture medial femoral condyle abrasion condroplasty medial femoral condyle lateral menisectomy;  Surgeon: Tobi Bastos, MD;  Location: [facility];  Service: Orthopedics;  Laterality: Right;  . LEFT HEART CATHETERIZATION WITH CORONARY  ANGIOGRAM N/A 10/26/2013   Procedure: LEFT HEART CATHETERIZATION WITH CORONARY ANGIOGRAM;  Surgeon: Jettie Booze, MD;  Location: [facility] CATH LAB;  Service: Cardiovascular;  Laterality: N/A;  . LUMBAR [place] SURGERY  2001  . SHOULDER SURGERY Right    right fx  . TONSILLECTOMY    . TRANSTHORACIC ECHOCARDIOGRAM  03/2016   EF 55-60%, grade I DD, LAE  . WRIST SURGERY Right    right fx    SOCIAL HISTORY: Social History   Social History  . Marital status: Married    Spouse name: susan  . Number of children: 2  . Years of education: N/A   Occupational History  . Retired    Social History Main Topics  . Smoking status: Former Smoker    Packs/day: 1.50    Years: 30.00    Types: Cigarettes, Pipe, Cigars    Quit date: 10/30/1983  .  Smokeless tobacco: Former Systems developer    Types: Chew    Quit date: 05/15/2016  . Alcohol use No  . Drug use: No  . Sexual activity: Not on file   Other Topics Concern  . Not on file   Social History Narrative   Married, one son and one daughter.   His daughter and her two children live with him.   Coffee daily.  Former smoker.  No alcohol.   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   Attends church weekly-   Oxygen continuous       FAMILY HISTORY: Family History  Problem Relation Age of Onset  . Heart attack Mother   . Aneurysm Father   . Alcohol abuse Father   . Diabetes Maternal Aunt   . Colon cancer Neg Hx     ALLERGIES:  has No Known Allergies.  MEDICATIONS:  Current Outpatient Prescriptions  Medication Sig Dispense Refill  . aspirin 81 MG tablet Take 81 mg by mouth daily.      Marland Kitchen atorvastatin (LIPITOR) 80 MG tablet Take 1 tablet (80 mg total) by mouth daily. 90 tablet 3  . beta carotene w/minerals (OCUVITE) tablet Take 1 tablet by mouth daily.    . cetirizine (ZYRTEC) 10 MG tablet Take 10 mg by mouth daily.    . citalopram (CELEXA) 20 MG tablet Take 1 tablet (20 mg total) by mouth daily. 90 tablet 3  . clonazePAM (KLONOPIN) 1 MG tablet TAKE ONE TABLET BY MOUTH TWICE DAILY AS NEEDED FOR ANXIETY 60 tablet 5  . cromolyn (OPTICROM) 4 % ophthalmic solution Place 1 drop into both eyes 4 (four) times daily.    Marland Kitchen diltiazem (CARDIZEM CD) 120 MG 24 hr capsule TAKE ONE CAPSULE BY MOUTH ONCE DAILY 30 capsule 5  . ezetimibe (ZETIA) 10 MG tablet Take 1 tablet (10 mg total) by mouth daily. 30 tablet 11  . gabapentin (NEURONTIN) 300 MG capsule TAKE ONE CAPSULE BY MOUTH THREE TIMES DAILY 270 capsule 3  . glucose blood (ONE TOUCH ULTRA TEST) test strip TEST 3 TIMES A DAY AS DIRECTED 100 each 11  . hydrOXYzine (ATARAX/VISTARIL) 25 MG tablet 1 tab at supper and 1 at bedtime (for insomnia) 60 tablet 11  . Insulin Lispro (HUMALOG KWIKPEN) 200 UNIT/ML SOPN Inject 10  Units into the skin 3 (three) times daily before meals. 15 mL 6  . Insulin NPH, Human,, Isophane, (HUMULIN N KWIKPEN) 100 UNIT/ML Kiwkpen 90 U SQ qAM and 30 U SQ qSupper 60 mL 6  . isosorbide mononitrate (IMDUR) 60 MG 24 hr  tablet Take 1 tablet (60 mg total) by mouth at bedtime. 30 tablet 11  . metFORMIN (GLUCOPHAGE) 1000 MG tablet TAKE ONE TABLET BY MOUTH TWICE DAILY WITH MEALS 60 tablet 6  . Omega-3 Fatty Acids ([employer] FISH OIL) 1000 MG CAPS Take 2 tablets by mouth 2 (two) times daily. 90 capsule   . ondansetron (ZOFRAN) 8 MG tablet Take 1 tablet (8 mg total) by mouth 2 (two) times daily as needed for refractory nausea / vomiting. Start on day 3 after chemo. 30 tablet 1  . ONETOUCH DELICA LANCETS 08X MISC 1 each by Other route 3 (three) times daily. USE TO TEST 3 TIMES A DAY 100 each 11  . OXYGEN Inhale 2 L/min into the lungs continuous.    . pantoprazole (PROTONIX) 40 MG tablet Take 1 tablet (40 mg total) by mouth 2 (two) times daily. 60 tablet 3  . prochlorperazine (COMPAZINE) 10 MG tablet Take 1 tablet (10 mg total) by mouth every 6 (six) hours as needed (Nausea or vomiting). 30 tablet 1  . torsemide (DEMADEX) 20 MG tablet Take 1 tablet (20 mg total) by mouth daily. Take extra tab as directed for weight gain. 45 tablet 11  . traMADol (ULTRAM) 50 MG tablet Take 1 tablet (50 mg total) by mouth every 8 (eight) hours as needed. 60 tablet 1   No current facility-administered medications for this visit.     REVIEW OF SYSTEMS:   Constitutional: Denies fevers, chills or abnormal night sweats   Eyes: Denies blurriness of vision, double vision or watery eyes Ears, nose, mouth, throat, and face: Denies mucositis or sore throat Respiratory: Denies cough, dyspnea or wheezes Cardiovascular: Denies palpitation, chest discomfort or lower extremity swelling Gastrointestinal:  Denies nausea, heartburn or change in bowel habits Skin: Denies abnormal skin rashes Lymphatics: Denies new lymphadenopathy or easy  bruising Neurological:Denies numbness, tingling or new weaknesses Behavioral/Psych: Mood is stable, no new changes  All other systems were reviewed with the patient and are negative.  PHYSICAL EXAMINATION: ECOG PERFORMANCE STATUS: 3 - Symptomatic, >50% confined to bed  Vitals:   07/20/16 1259  BP: (!) 111/56  Pulse: (!) 109  Resp: 18  Temp: 98.4 F (36.9 C)   Filed Weights   07/20/16 1259  Weight: 288 lb 8 oz (130.9 kg)    GENERAL:alert, no distress and comfortable, morbid obese, sitting in wheelchair with nasal cannula oxygen SKIN: skin color, texture, turgor are normal, no rashes or significant lesions EYES: normal, conjunctiva are pink and non-injected, sclera clear OROPHARYNX:no exudate, no erythema and lips, buccal mucosa, and tongue normal  NECK: supple, thyroid normal size, non-tender, without nodularity LYMPH:  no palpable lymphadenopathy in the cervical, axillary or inguinal LUNGS: (+) Scatter rales on bilateral lung base, no wheezing HEART: regular rate & rhythm and no murmurs and no lower extremity edema ABDOMEN:abdomen soft, non-tender and normal bowel sounds Musculoskeletal:no cyanosis of digits and no clubbing  PSYCH: alert & oriented x 3 with fluent speech NEURO: no focal motor/sensory deficits EXT: Trace edema, he wears compression stocks  LABORATORY DATA:  I have reviewed the data as listed CBC Latest Ref Rng & Units 07/20/2016 07/13/2016 07/06/2016  WBC 4.0 - 10.3 10e3/uL 1.6(L) 3.6(L) 5.3  Hemoglobin 13.0 - 17.1 g/dL 10.4(L) 12.2(L) 11.9(L)  Hematocrit 38.4 - 49.9 % 31.5(L) 37.6(L) 37.0(L)  Platelets 140 - 400 10e3/uL 145 217 171   CMP Latest Ref Rng & Units 07/20/2016 07/13/2016 07/06/2016  Glucose 70 - 140 mg/dl 170(H) 115 152(H)  BUN 7.0 -  26.0 mg/dL 15.4 21.4 17.9  Creatinine 0.7 - 1.3 mg/dL 1.1 1.1 1.0  Sodium 136 - 145 mEq/L 135(L) 136 134(L)  Potassium 3.5 - 5.1 mEq/L 3.5 4.5 4.9  Chloride 96 - 112 mEq/L - - -  CO2 22 - 29 mEq/L '25 25 23  ' Calcium  8.4 - 10.4 mg/dL 9.0 9.2 9.0  Total Protein 6.4 - 8.3 g/dL 6.4 7.2 6.8  Total Bilirubin 0.20 - 1.20 mg/dL 0.53 0.69 0.53  Alkaline Phos 40 - 150 U/L 55 62 48  AST 5 - 34 U/L 34 36(H) 24  ALT 0 - 55 U/L 39 36 22   ANC 0.8 TODAY   PATHOLOGY REPORT Diagnosis 05/04/2016 1. Esophagogastric junction, biopsy, ulcerative mass - POORLY DIFFERENTIATED CARCINOMA, SEE COMMENT. 2. Stomach, biopsy - REACTIVE GASTROPATHY. - NEGATIVE FOR HELICOBACTER PYLORI. - NO INTESTINAL METAPLASIA, DYSPLASIA, OR MALIGNANCY. Microscopic Comment 1. The majority of the specimen consists of gastroesophageal mucosa with reflux changes. There is a small focus of tumor underlying the squamous mucosa. There is lymphovascular invasion. There is no background intestinal metaplasia (Barrett's esophagus). Immunohistochemistry reveals only very focal weak cytokeratin 5/6, negative p63, and negative mucicarmine in the limited remaining tumor. Dr. Saralyn Pilar has reviewed the case. The case was called to Dr. Hilarie Fredrickson on 05/08/2016. 2. A Warthin-Starry stain is performed to determine the possibility of the presence of Helicobacter pylori. The Warthin-Starry stain is negative for organisms of Helicobacter pylori.  RADIOGRAPHIC STUDIES: I have personally reviewed the radiological images as listed and agreed with the findings in the report. No results found. EGD 05/04/2016 A large, ulcerating mass with no active bleeding and no stigmata of recent bleeding was found at the gastroesophageal junction extending into the gastric cardia, beginning 40 cm from the incisors. The mass was non-obstructing and partially circumferential (involving one-half of the lumen circumference). The mass extends approximately 5 cm. IMPRESSION:  -Likely malignant esophageal tumor was found at the gastroesophageal junction extending into the gastric cardia. Biopsied. - Non-bleeding erosive gastropathy. Biopsied. - Erythematous duodenopathy. - Normal second  portion of the duodenum.  ASSESSMENT & PLAN: 70 year old Caucasian male, with multiple comorbidities, including hypertension, diabetes, OSA, coronary artery disease, diastolic CHF, on continuous oxygen, morbid obesity, presented with progressive dysphagia and odynophagia.  1. Cancer of cardio-esophageal junction, poorly differentiated, [id number], stage IV  -I reviewed his CT scan, EGD, and the biopsy results with patient and his family members in great details. -His biopsy pathology was reviewed in our tumor board 2 days ago, the morphology and weakly positive for CK 5/6, fevers squamous cell carcinoma -His CT scan findings are very concerning for distant metastasis to abdominal lymph nodes.  -I reviewed the PET scan images with patient and his daughter in person today, he has hypermetabolic GE junction tumor, and diffuse adenopathy in mediastinum and upper abdomen. -We discussed the aggressive nature of esophageal cancer, an incurable nature of his disease due to the distant metastasis. The goal of therapy is palliative, to prolong his life and palliate his symptoms. -He has completed palliative radiation -He tolerates the first TWO weekS chemotherapy with Taxol and carbo well overall. Lab results reviewed, he has developed neutropenia, with ANC 0.8, and slightly worsening anemia, platelet counts normal. I will hold carboplatin today, and proceed with Taxol alone. -I'll give him 1 week off chemotherapy next week -plan to change chemotherapy with [place] and Taxol to every 3 weeks in 2 weeks  2. CAD, diastolic CHF, on continuous oxygen -He will continue follow-up with his cardiologist  Dr. Radford Pax -His Plavix has been held since the EGD and biopsy, he is on baby aspirin. -Need to watch his fluid intake during his chemotherapy, and also avoid fluid overload from chemo  -he is back on lasix, dsypnea improved   3. DM, HTN -We discussed his blood pressure and glucose need to be monitored closely during  the chemotherapy, which may affect his blood glucose and blood pressure -He will continue medication, monitor his sugar and blood pressure at home, and follow-up with his primary care physician -will decrease premed dexa before taxol  -History simple has been held due to his borderline low blood pressure -His insulin has been decreased due to decreased by mouth intake -She'll continue follow-up with his primary care physician  4. OSA, morbid obesity -We discussed healthy diet, I encouraged him to to be more physically active, and consider home PT -He will be seen by our dietitian Pamala Hurry today. I strongly encouraged him to follow-up with dietitian during the therapy for nutrition support.  5. Epigastric pain -Likely secondary to his underlying malignancy -worse after radiation, improved daily -continue tramadol as need    Plan -week 3 chemotherapy today, with taxol alone, due to [place] 0.8, hold [place]  -Return to clinic in 2 weeks to start [place] and taxol every 3 weeks   All questions were answered. The patient knows to call the clinic with any problems, questions or concerns. I spent 20 minutes counseling the patient face to face. The total time spent in the appointment was 25 minutes and more than 50% was on counseling.     Truitt Merle, MD 07/20/2016

## 2016-07-20 NOTE — Patient Instructions (Signed)
[facility] Discharge Instructions for Patients Receiving Chemotherapy  Today you received the following chemotherapy agents Taxol  To help prevent nausea and vomiting after your treatment, we encourage you to take your nausea medication as directed.   If you develop nausea and vomiting that is not controlled by your nausea medication, call the clinic.   BELOW ARE SYMPTOMS THAT SHOULD BE REPORTED IMMEDIATELY:  *FEVER GREATER THAN 100.5 F  *CHILLS WITH OR WITHOUT FEVER  NAUSEA AND VOMITING THAT IS NOT CONTROLLED WITH YOUR NAUSEA MEDICATION  *UNUSUAL SHORTNESS OF BREATH  *UNUSUAL BRUISING OR BLEEDING  TENDERNESS IN MOUTH AND THROAT WITH OR WITHOUT PRESENCE OF ULCERS  *URINARY PROBLEMS  *BOWEL PROBLEMS  UNUSUAL RASH Items with * indicate a potential emergency and should be followed up as soon as possible.  Feel free to call the clinic you have any questions or concerns. The clinic phone number is [phone].  Please show the [facility] at check-in to the Emergency Department and triage nurse.  Paclitaxel injection What is this medicine? PACLITAXEL (PAK li TAX el) is a chemotherapy drug. It targets fast dividing cells, like cancer cells, and causes these cells to die. This medicine is used to treat ovarian cancer, breast cancer, and other cancers. This medicine may be used for other purposes; ask your health care provider or pharmacist if you have questions. What should I tell my health care provider before I take this medicine? They need to know if you have any of these conditions: -blood disorders -irregular heartbeat -infection (especially a virus infection such as chickenpox, cold sores, or herpes) -liver disease -previous or ongoing radiation therapy -an unusual or allergic reaction to paclitaxel, alcohol, polyoxyethylated castor oil, other chemotherapy agents, other medicines, foods, dyes, or preservatives -pregnant or trying to get  pregnant -breast-feeding How should I use this medicine? This drug is given as an infusion into a vein. It is administered in a hospital or clinic by a specially trained health care professional. Talk to your pediatrician regarding the use of this medicine in children. Special care may be needed. Overdosage: If you think you have taken too much of this medicine contact a poison control center or emergency room at once. NOTE: This medicine is only for you. Do not share this medicine with others. What if I miss a dose? It is important not to miss your dose. Call your doctor or health care professional if you are unable to keep an appointment. What may interact with this medicine? Do not take this medicine with any of the following medications: -disulfiram -metronidazole This medicine may also interact with the following medications: -cyclosporine -diazepam -ketoconazole -medicines to increase blood counts like filgrastim, pegfilgrastim, sargramostim -other chemotherapy drugs like cisplatin, doxorubicin, epirubicin, etoposide, teniposide, vincristine -quinidine -testosterone -vaccines -verapamil Talk to your doctor or health care professional before taking any of these medicines: -acetaminophen -aspirin -ibuprofen -ketoprofen -naproxen This list may not describe all possible interactions. Give your health care provider a list of all the medicines, herbs, non-prescription drugs, or dietary supplements you use. Also tell them if you smoke, drink alcohol, or use illegal drugs. Some items may interact with your medicine. What should I watch for while using this medicine? Your condition will be monitored carefully while you are receiving this medicine. You will need important blood work done while you are taking this medicine. This drug may make you feel generally unwell. This is not uncommon, as chemotherapy can affect healthy cells as well as cancer  cells. Report any side effects. Continue  your course of treatment even though you feel ill unless your doctor tells you to stop. This medicine can cause serious allergic reactions. To reduce your risk you will need to take other medicine(s) before treatment with this medicine. In some cases, you may be given additional medicines to help with side effects. Follow all directions for their use. Call your doctor or health care professional for advice if you get a fever, chills or sore throat, or other symptoms of a cold or flu. Do not treat yourself. This drug decreases your body's ability to fight infections. Try to avoid being around people who are sick. This medicine may increase your risk to bruise or bleed. Call your doctor or health care professional if you notice any unusual bleeding. Be careful brushing and flossing your teeth or using a toothpick because you may get an infection or bleed more easily. If you have any dental work done, tell your dentist you are receiving this medicine. Avoid taking products that contain aspirin, acetaminophen, ibuprofen, naproxen, or ketoprofen unless instructed by your doctor. These medicines may hide a fever. Do not become pregnant while taking this medicine. Women should inform their doctor if they wish to become pregnant or think they might be pregnant. There is a potential for serious side effects to an unborn child. Talk to your health care professional or pharmacist for more information. Do not breast-feed an infant while taking this medicine. Men are advised not to father a child while receiving this medicine. This product may contain alcohol. Ask your pharmacist or healthcare provider if this medicine contains alcohol. Be sure to tell all healthcare providers you are taking this medicine. Certain medicines, like metronidazole and disulfiram, can cause an unpleasant reaction when taken with alcohol. The reaction includes flushing, headache, nausea, vomiting, sweating, and increased thirst. The reaction  can last from 30 minutes to several hours. What side effects may I notice from receiving this medicine? Side effects that you should report to your doctor or health care professional as soon as possible: -allergic reactions like skin rash, itching or hives, swelling of the face, lips, or tongue -low blood counts - This drug may decrease the number of white blood cells, red blood cells and platelets. You may be at increased risk for infections and bleeding. -signs of infection - fever or chills, cough, sore throat, pain or difficulty passing urine -signs of decreased platelets or bleeding - bruising, pinpoint red spots on the skin, black, tarry stools, nosebleeds -signs of decreased red blood cells - unusually weak or tired, fainting spells, lightheadedness -breathing problems -chest pain -high or low blood pressure -mouth sores -nausea and vomiting -pain, swelling, redness or irritation at the injection site -pain, tingling, numbness in the hands or feet -slow or irregular heartbeat -swelling of the ankle, feet, hands Side effects that usually do not require medical attention (report to your doctor or health care professional if they continue or are bothersome): -bone pain -complete hair loss including hair on your head, underarms, pubic hair, eyebrows, and eyelashes -changes in the color of fingernails -diarrhea -loosening of the fingernails -loss of appetite -muscle or joint pain -red flush to skin -sweating This list may not describe all possible side effects. Call your doctor for medical advice about side effects. You may report side effects to FDA at 1-800-FDA-1088. Where should I keep my medicine? This drug is given in a hospital or clinic and will not be stored at home. NOTE:   This sheet is a summary. It may not cover all possible information. If you have questions about this medicine, talk to your doctor, pharmacist, or health care provider.    2016, Elsevier/Gold Standard.  (2015-06-02 13:02:56)

## 2016-07-23 ENCOUNTER — Telehealth: Payer: Self-pay | Admitting: Medical Oncology

## 2016-07-23 NOTE — Progress Notes (Signed)
Mr. Sender "[name]" Fewkes is here for a one month follow up visit for cancer of the cardio-esophageal junction.  Pain: Having  aching pain over  Knees,ankles and hands take Tramadol.  SWALLOWING/DIET: Pt denies dysphagia. Pt reports a regular unmodified diet orally.  BOWEL:Normal bowel movements  Pt reports Nausea and Diarrhea  3 not every day, a bowel movement every day.   SKIN: Skin exam reveals normal skin color. Pt continues to apply nothing to skin after bathing. OTHER: Pt complains of Having fatigue all during the day with weakness..  Receiving Erbitux (cetuximab)? No.  *Acne-like rash on (face, neck, body)? No WEIGHT/VS: Wt Readings from Last 3 Encounters:  07/24/16 289 lb 4.8 oz (131.2 kg)  07/20/16 288 lb 8 oz (130.9 kg)  07/13/16 287 lb 8 oz (130.4 kg)  BP 114/65 (BP Location: Left Arm, Patient Position: Sitting, Cuff Size: Large)   Pulse (!) 112   Temp 98.7 F (37.1 C) (Oral)   Resp 20   Ht 5\' 9"  (1.753 m)   Wt 289 lb 4.8 oz (131.2 kg)   SpO2 95%   BMI 42.72 kg/m

## 2016-07-23 NOTE — Telephone Encounter (Signed)
Returned pt call. He is reporting his arthritis "is giving me a fit in my knees, arm , back and hips ,the papers I have say I can't take ibuprophen. ". " I  instructed him to take Tramadol every 8 hours prn. He said he takes it before meals and the last dose was 9 am. I told him he can take it again now. "It really didn't do anything for this arthritis pain." Per Dr. Burr Medico I told pt he can take ibuprofen once a day between [facility] ,  but it can cause gastric bleeding and to monitor for that.

## 2016-07-24 ENCOUNTER — Encounter: Payer: Self-pay | Admitting: Radiation Oncology

## 2016-07-24 ENCOUNTER — Ambulatory Visit
Admission: RE | Admit: 2016-07-24 | Discharge: 2016-07-24 | Disposition: A | Discharge status: Home / Self Care | Payer: Medicare Other | Source: Ambulatory Visit | Attending: Radiation Oncology | Admitting: Radiation Oncology

## 2016-07-24 VITALS — BP 114/65 | HR 112 | Temp 98.7°F | Resp 20 | Ht 69.0 in | Wt 289.3 lb

## 2016-07-24 DIAGNOSIS — Z923 Personal history of irradiation: Secondary | ICD-10-CM | POA: Insufficient documentation

## 2016-07-24 DIAGNOSIS — Z9221 Personal history of antineoplastic chemotherapy: Secondary | ICD-10-CM | POA: Insufficient documentation

## 2016-07-24 DIAGNOSIS — C160 Malignant neoplasm of cardia: Secondary | ICD-10-CM

## 2016-07-24 DIAGNOSIS — Z794 Long term (current) use of insulin: Secondary | ICD-10-CM | POA: Diagnosis not present

## 2016-07-24 DIAGNOSIS — Z9981 Dependence on supplemental oxygen: Secondary | ICD-10-CM | POA: Insufficient documentation

## 2016-07-24 DIAGNOSIS — Z7982 Long term (current) use of aspirin: Secondary | ICD-10-CM | POA: Insufficient documentation

## 2016-07-24 NOTE — Progress Notes (Signed)
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  Interval Since Last Radiation:  4 weeks   06/04/2016 through 06/22/2016:  The patient was treated to the esophageal tumor using a 5 field 3-D conformal technique on 3-D tomotherapy. The patient received 37.5 gray in 15 fractions.  Narrative:  The patient returns today for routine follow-up. He did well with radiotherapy and didn't ever use his Radioplex. He did have some dysphagia prior to treatment which improved initially, then returned due to radiation esophagitis.                            On review of systems, the patient states since his last visit his esophagitis has resolved. He denies any trouble with eating regular foods or with swallowing liquids. He denies any shortness of breath, but continues to use his oxygen for COPD. He denies any fevers, chills, or bowel dysfunction. On Day 4 of his chemo regimen he feels nauseated, but has not vomited. No other complaints are noted.  ALLERGIES:  has No Known Allergies.  Meds: Current Outpatient Prescriptions  Medication Sig Dispense Refill  . aspirin 81 MG tablet Take 81 mg by mouth daily.      Marland Kitchen atorvastatin (LIPITOR) 80 MG tablet Take 1 tablet (80 mg total) by mouth daily. 90 tablet 3  . beta carotene w/minerals (OCUVITE) tablet Take 1 tablet by mouth daily.    . cetirizine (ZYRTEC) 10 MG tablet Take 10 mg by mouth daily.    . citalopram (CELEXA) 20 MG tablet Take 1 tablet (20 mg total) by mouth daily. 90 tablet 3  . clonazePAM (KLONOPIN) 1 MG tablet TAKE ONE TABLET BY MOUTH TWICE DAILY AS NEEDED FOR ANXIETY 60 tablet 5  . cromolyn (OPTICROM) 4 % ophthalmic solution Place 1 drop into both eyes 4 (four) times daily.     Marland Kitchen ezetimibe (ZETIA) 10 MG tablet Take 1 tablet (10 mg total) by mouth daily. 30 tablet 11  . gabapentin (NEURONTIN) 300 MG capsule TAKE ONE CAPSULE BY MOUTH THREE TIMES DAILY 270 capsule 3  . glucose blood (ONE TOUCH ULTRA TEST) test strip TEST 3 TIMES A DAY AS DIRECTED 100 each 11  . hydrOXYzine (ATARAX/VISTARIL) 25 MG tablet 1 tab at supper and 1 at bedtime (for insomnia) 60 tablet 11  . Insulin Lispro (HUMALOG KWIKPEN) 200 UNIT/ML SOPN Inject 10 Units into the skin 3 (three) times daily before meals. 15 mL 6  . Insulin NPH, Human,, Isophane, (HUMULIN N KWIKPEN) 100 UNIT/ML Kiwkpen 90 U SQ qAM and 30 U SQ qSupper 60 mL 6  . isosorbide mononitrate (IMDUR) 60 MG 24 hr tablet Take 1 tablet (60 mg total) by mouth at bedtime. 30 tablet 11  . metFORMIN (GLUCOPHAGE) 1000 MG tablet TAKE ONE TABLET BY MOUTH TWICE DAILY WITH MEALS 60 tablet 6  . Omega-3 Fatty Acids ([employer] FISH OIL) 1000 MG CAPS Take 2 tablets by mouth 2 (two) times daily. 90 capsule   . ondansetron (ZOFRAN) 8 MG tablet Take 1 tablet (8 mg total) by mouth 2 (two) times daily as needed for refractory nausea / vomiting. Start on day 3 after chemo. 30 tablet 1  . ONETOUCH  DELICA LANCETS 99991111 MISC 1 each by Other route 3 (three) times daily. USE TO TEST 3 TIMES A DAY 100 each 11  . OXYGEN Inhale 2 L/min into the lungs continuous.    . pantoprazole (PROTONIX) 40 MG tablet Take 1 tablet (40 mg total) by mouth 2 (two) times daily. 60 tablet 3  . prochlorperazine (COMPAZINE) 10 MG tablet Take 1 tablet (10 mg total) by mouth every 6 (six) hours as needed (Nausea or vomiting). 30 tablet 1  . torsemide (DEMADEX) 20 MG tablet Take 1 tablet (20 mg total) by mouth daily. Take extra tab as directed for weight gain. 45 tablet 11  . traMADol (ULTRAM) 50 MG tablet Take 1 tablet (50 mg total) by mouth every 8 (eight) hours as needed. 60 tablet 1   No current facility-administered medications for this encounter.     Physical Findings:  height is 5\' 9"   (1.753 m) and weight is 289 lb 4.8 oz (131.2 kg). His oral temperature is 98.7 F (37.1 C). His blood pressure is 114/65 and his pulse is 112 (abnormal). His respiration is 20 and oxygen saturation is 95%.  In general this is a well appearing Caucasian male in no acute distress. He's alert and oriented x4 and appropriate throughout the examination. Cardiopulmonary assessment is negative for acute distress and he exhibits normal effort. His anterior abdomen is evaluated and is negative for desquamation or hyperpigmentation.  Lab Findings: Lab Results  Component Value Date   WBC 1.6 (L) 07/20/2016   HGB 10.4 (L) 07/20/2016   HCT 31.5 (L) 07/20/2016   MCV 83.8 07/20/2016   PLT 145 07/20/2016     Radiographic Findings: No results found.  Impression/Plan: 1. 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 He will continue with chemotherapy with Dr. Burr Medico and receive Taxol/Carboplatin. I would like to see him back in 6 months to see how he's doing. He will notify us of any questions or concerns prior to that visit. 2. Chemotherapy induced nausea. I suggested the patient use scheduled Zofran q 8 hours on D4 of treatment. He will continue to follow up with Dr. Burr Medico for this as well.     Carola Rhine, PAC

## 2016-07-25 ENCOUNTER — Encounter: Payer: Self-pay | Admitting: Family Medicine

## 2016-07-25 ENCOUNTER — Ambulatory Visit (INDEPENDENT_AMBULATORY_CARE_PROVIDER_SITE_OTHER): Payer: Medicare Other | Admitting: Family Medicine

## 2016-07-25 VITALS — BP 97/63 | HR 107 | Temp 98.2°F | Resp 20 | Wt 280.4 lb

## 2016-07-25 DIAGNOSIS — I10 Essential (primary) hypertension: Secondary | ICD-10-CM

## 2016-07-25 DIAGNOSIS — E118 Type 2 diabetes mellitus with unspecified complications: Secondary | ICD-10-CM

## 2016-07-25 DIAGNOSIS — I9589 Other hypotension: Secondary | ICD-10-CM

## 2016-07-25 NOTE — Progress Notes (Signed)
Pre visit review using our clinic review tool, if applicable. No additional management support is needed unless otherwise documented below in the visit note. 

## 2016-07-25 NOTE — Progress Notes (Signed)
OFFICE VISIT  07/25/2016   CC:  Chief Complaint  Patient presents with  . Follow-up    Blood pressure, Med. d/c   HPI:    Patient is a 70 y.o. Caucasian male who presents accompanied by his wife for 2 week f/u DM and HTN. Last visit we lowered his insulin dosing quite a bit b/c his PO intake was way down due to his esophageal cancer, plus HbA1c was 4.8%.  Also, his bp was consistently low and his lisinopril had already been cut in half from 25m to 256mqd. I decided to completely d/c lisinopril last visit.  Getting ongoing palliative chemo (Taxol) for esophageal cancer (stage 4).  He seems to be eating better/more consistently than when I saw him last. Home bps: he has no way to check this at home.  At most recent chemo his bp was around 115/70s. Home glucoses: 90s-160s, lowest 70s.  Usually 120-130.  Lunch numbers usually 90.  Supper numbers 110-120. Overall our insulin dosing seems to be on the mark.  Taxol is making his muscles and joints hurt.  Says tramadol is not helping his pain as prescribed by his oncologist.  Past Medical History:  Diagnosis Date  . Asthma    as a child  . Cataract    multiple types, bilateral  . Chronic diastolic CHF (congestive heart failure) ([place]4/30/2010  . Chronic renal insufficiency, stage III (moderate) 2017   Stage II/III (GFR around 60)  . 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 COPD (chronic obstructive pulmonary disease) (HCLeb[place]  . Coronary artery disease    chronically occluded LAD and diagonal with right to left collaterals, mild disease in the left circ and moderate disease in the mid RCA on medical management with Imdur, ASA, and Plavix.  . Dermatitis 05/2014  . DIABETES MELLITUS, TYPE II 06/24/2007   No diab retpthy as of 08/05/15 eye exam.  . Esophageal cancer (HCP[place]07/2017   poorly differentiated carcinoma (Dr. Pyrtle--EGD).  CT C/A/P showed metastatic adenopathy in  mediastinum and upper abdomen 05/09/16.  Tx plan is palliative radiation (completed 06/22/16), then palliative systemic chemotherapy after he recovers from radiation.  . Essential hypertension 05/01/2007   Qualifier: Diagnosis of  By: ArTiney RougeMA, LaEllison Hughs   . History of kidney stones   . Hyperkalemia 12/2015   Decreased ACE-I by 50% in response, then potassium normalized.  . Marland KitchenYPERLIPIDEMIA 06/24/2007  . HYPERTENSION 05/01/2007  . Hypoxemia 01/27/2010  . [facility]   pt states he was informed per MD that he has had one but pt was unaware   . OBESITY 09/02/2008  . Obesity hypoventilation syndrome (HCC)    oxygen 24/7 (2 liters [place] as of 02/2016)  . On home oxygen therapy    Oxygen @ 2l/m nasally 24/7 hours  . OSA (obstructive sleep apnea)    not tested; pt scored 4 per stop bang tool results sent to PCP   . OSTEOARTHRITIS 05/01/2007  . Presbycusis of both ears 05/2015   [place]SM[place]aleNT  . Pruritic condition 05/2014   Allergist summer 2015, no new testing.  . 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 BeMelina FiddlerO.D.: Rt, loss of inf/temp quad and some loss sup/temp.  ?CVA  ? Pituitary tumor? ?Brain met.    Past Surgical History:  Procedure Laterality Date  . APPENDECTOMY    . BALLOON DILATION N/A 05/04/2016  Procedure: BALLOON DILATION;  Surgeon: Jerene Bears, MD;  Location: [facility];  Service: Gastroenterology;  Laterality: N/A;  . CARDIAC CATHETERIZATION    . CARPAL TUNNEL RELEASE Left   . CATARACT EXTRACTION W/PHACO Right 04/29/2013   Procedure: CATARACT EXTRACTION PHACO AND INTRAOCULAR LENS PLACEMENT (IOC);  Surgeon: Adonis Brook, MD;  Location: [facility];  Service: Ophthalmology;  Laterality: Right;  . CATARACT EXTRACTION, BILATERAL    . COLONOSCOPY W/ POLYPECTOMY  08/2011   Many polyps--all hyperplastic, severe diverticulosis, int hem.  [employer] hemoccult testing via [facility] 06/22/15 NEG  . ESOPHAGOGASTRODUODENOSCOPY N/A 05/04/2016   Procedure: ESOPHAGOGASTRODUODENOSCOPY (EGD);  Surgeon: Jerene Bears, MD;  Location: [facility];  Service: Gastroenterology;  Laterality: N/A;  . KNEE ARTHROSCOPY WITH MEDIAL MENISECTOMY Right 12/16/2014   Procedure: RIGHT KNEE ARTHROSCOPY WITH MEDIAL MENISECTOMY microfracture medial femoral condyle abrasion condroplasty medial femoral condyle lateral menisectomy;  Surgeon: Tobi Bastos, MD;  Location: [facility];  Service: Orthopedics;  Laterality: Right;  . LEFT HEART CATHETERIZATION WITH CORONARY ANGIOGRAM N/A 10/26/2013   Procedure: LEFT HEART CATHETERIZATION WITH CORONARY ANGIOGRAM;  Surgeon: Jettie Booze, MD;  Location: [facility] CATH LAB;  Service: Cardiovascular;  Laterality: N/A;  . LUMBAR [place] SURGERY  2001  . SHOULDER SURGERY Right    right fx  . TONSILLECTOMY    . TRANSTHORACIC ECHOCARDIOGRAM  03/2016   EF 55-60%, grade I DD, LAE  . WRIST SURGERY Right    right fx    Outpatient Medications Prior to Visit  Medication Sig Dispense Refill  . aspirin 81 MG tablet Take 81 mg by mouth daily.      Marland Kitchen atorvastatin (LIPITOR) 80 MG tablet Take 1 tablet (80 mg total) by mouth daily. 90 tablet 3  . beta carotene w/minerals (OCUVITE) tablet Take 1 tablet by mouth daily.    . cetirizine (ZYRTEC) 10 MG tablet Take 10 mg by mouth daily.    . citalopram (CELEXA) 20 MG tablet Take 1 tablet (20 mg total) by mouth daily. 90 tablet 3  . clonazePAM (KLONOPIN) 1 MG tablet TAKE ONE TABLET BY MOUTH TWICE DAILY AS NEEDED FOR ANXIETY 60 tablet 5  . cromolyn (OPTICROM) 4 % ophthalmic solution Place 1 drop into both eyes 4 (four) times daily.    Marland Kitchen ezetimibe (ZETIA) 10 MG tablet Take 1 tablet (10 mg total) by mouth daily. 30 tablet 11  . gabapentin (NEURONTIN) 300 MG capsule TAKE ONE CAPSULE BY MOUTH THREE TIMES DAILY 270 capsule 3  . glucose blood (ONE TOUCH ULTRA TEST) test strip TEST 3 TIMES A DAY AS DIRECTED 100 each 11  . hydrOXYzine (ATARAX/VISTARIL) 25 MG tablet 1 tab at supper and 1 at bedtime (for insomnia) 60 tablet 11  . Insulin Lispro (HUMALOG KWIKPEN)  200 UNIT/ML SOPN Inject 10 Units into the skin 3 (three) times daily before meals. 15 mL 6  . Insulin NPH, Human,, Isophane, (HUMULIN N KWIKPEN) 100 UNIT/ML Kiwkpen 90 U SQ qAM and 30 U SQ qSupper 60 mL 6  . isosorbide mononitrate (IMDUR) 60 MG 24 hr tablet Take 1 tablet (60 mg total) by mouth at bedtime. 30 tablet 11  . metFORMIN (GLUCOPHAGE) 1000 MG tablet TAKE ONE TABLET BY MOUTH TWICE DAILY WITH MEALS 60 tablet 6  . Omega-3 Fatty Acids ([employer] FISH OIL) 1000 MG CAPS Take 2 tablets by mouth 2 (two) times daily. 90 capsule   . ondansetron (ZOFRAN) 8 MG tablet Take 1 tablet (8 mg total) by mouth 2 (two) times daily as  needed for refractory nausea / vomiting. Start on day 3 after chemo. 30 tablet 1  . ONETOUCH DELICA LANCETS 09F MISC 1 each by Other route 3 (three) times daily. USE TO TEST 3 TIMES A DAY 100 each 11  . OXYGEN Inhale 2 L/min into the lungs continuous.    . pantoprazole (PROTONIX) 40 MG tablet Take 1 tablet (40 mg total) by mouth 2 (two) times daily. 60 tablet 3  . prochlorperazine (COMPAZINE) 10 MG tablet Take 1 tablet (10 mg total) by mouth every 6 (six) hours as needed (Nausea or vomiting). 30 tablet 1  . torsemide (DEMADEX) 20 MG tablet Take 1 tablet (20 mg total) by mouth daily. Take extra tab as directed for weight gain. 45 tablet 11  . traMADol (ULTRAM) 50 MG tablet Take 1 tablet (50 mg total) by mouth every 8 (eight) hours as needed. 60 tablet 1   No facility-administered medications prior to visit.     No Known Allergies  ROS As per HPI  PE: Blood pressure 97/63, pulse (!) 107, temperature 98.2 F (36.8 C), temperature source Oral, resp. rate 20, weight 280 lb 6.4 oz (127.2 kg), SpO2 98 %. Gen: alert, oriented x 4, walks with walker and wears oxygen via nasal cannulae. CV: RRR, distant S1 and S2, no audible m/r/g. Chest is clear, no wheezing or rales. Normal symmetric air entry throughout both lung fields. No chest wall deformities or tenderness. EXT: trace bilat  pitting LE edema.  No clubbing or cyanosis.  LABS:    Chemistry      Component Value Date/Time   NA 135 (L) 07/20/2016 1240   K 3.5 07/20/2016 1240   CL 99 05/07/2016 1044   CO2 25 07/20/2016 1240   BUN 15.4 07/20/2016 1240   CREATININE 1.1 07/20/2016 1240      Component Value Date/Time   CALCIUM 9.0 07/20/2016 1240   ALKPHOS 55 07/20/2016 1240   AST 34 07/20/2016 1240   ALT 39 07/20/2016 1240   BILITOT 0.53 07/20/2016 1240     Lab Results  Component Value Date   WBC 1.6 (L) 07/20/2016   HGB 10.4 (L) 07/20/2016   HCT 31.5 (L) 07/20/2016   MCV 83.8 07/20/2016   PLT 145 07/20/2016   Lab Results  Component Value Date   HGBA1C 4.8 07/10/2016    IMPRESSION AND PLAN:  1) DM 2, improved/more steadily normal glucose readings and less hypoglycemia since lowering insulins to current dosing (see med list).  2) Hypotension: has hx of HTN, was on lisinopril but we have slowly peeled this med away in order to allow bp to be closer to normal.  We'll continue to leave this med off for now.  Perhaps if he regains significant wt after chemo we'll see bp's rise to the point of having to restart it.  3) Stage 4 esophageal cancer: he'll be continuing with palliative chemo for several more treatments, and he is already finished with palliative radiation.  An After Visit Summary was printed and given to the patient.  FOLLOW UP: Return in about 3 months (around 10/24/2016) for routine chronic illness f/u (30 min).  Signed:  Crissie Sickles, MD           07/25/2016

## 2016-07-26 ENCOUNTER — Ambulatory Visit: Payer: Medicare Other | Admitting: Family Medicine

## 2016-08-03 ENCOUNTER — Telehealth: Payer: Self-pay | Admitting: Hematology

## 2016-08-03 ENCOUNTER — Other Ambulatory Visit (HOSPITAL_BASED_OUTPATIENT_CLINIC_OR_DEPARTMENT_OTHER): Payer: Medicare Other

## 2016-08-03 ENCOUNTER — Ambulatory Visit (HOSPITAL_BASED_OUTPATIENT_CLINIC_OR_DEPARTMENT_OTHER): Payer: Medicare Other | Admitting: Hematology

## 2016-08-03 ENCOUNTER — Telehealth: Payer: Self-pay | Admitting: *Deleted

## 2016-08-03 ENCOUNTER — Ambulatory Visit: Payer: Medicare Other

## 2016-08-03 ENCOUNTER — Other Ambulatory Visit (HOSPITAL_COMMUNITY)
Admission: RE | Admit: 2016-08-03 | Discharge: 2016-08-03 | Disposition: A | Discharge status: Home / Self Care | Payer: Medicare Other | Source: Ambulatory Visit | Attending: Hematology | Admitting: Hematology

## 2016-08-03 ENCOUNTER — Encounter: Payer: Self-pay | Admitting: Hematology

## 2016-08-03 VITALS — BP 132/60 | HR 111 | Temp 98.9°F | Resp 18 | Ht 69.0 in | Wt 285.9 lb

## 2016-08-03 DIAGNOSIS — C160 Malignant neoplasm of cardia: Secondary | ICD-10-CM

## 2016-08-03 DIAGNOSIS — C159 Malignant neoplasm of esophagus, unspecified: Secondary | ICD-10-CM | POA: Diagnosis not present

## 2016-08-03 DIAGNOSIS — E119 Type 2 diabetes mellitus without complications: Secondary | ICD-10-CM | POA: Diagnosis not present

## 2016-08-03 DIAGNOSIS — I5032 Chronic diastolic (congestive) heart failure: Secondary | ICD-10-CM | POA: Diagnosis not present

## 2016-08-03 DIAGNOSIS — M1990 Unspecified osteoarthritis, unspecified site: Secondary | ICD-10-CM

## 2016-08-03 DIAGNOSIS — I10 Essential (primary) hypertension: Secondary | ICD-10-CM | POA: Diagnosis not present

## 2016-08-03 DIAGNOSIS — G893 Neoplasm related pain (acute) (chronic): Secondary | ICD-10-CM

## 2016-08-03 LAB — CBC WITH DIFFERENTIAL/PLATELET
BASO%: 1.1 % (ref 0.0–2.0)
Basophils Absolute: 0.1 10*3/uL (ref 0.0–0.1)
EOS ABS: 0 10*3/uL (ref 0.0–0.5)
EOS%: 0.3 % (ref 0.0–7.0)
HCT: 29.5 % — ABNORMAL LOW (ref 38.4–49.9)
HEMOGLOBIN: 10.1 g/dL — AB (ref 13.0–17.1)
LYMPH#: 0.6 10*3/uL — AB (ref 0.9–3.3)
LYMPH%: 7 % — ABNORMAL LOW (ref 14.0–49.0)
MCH: 27.5 pg (ref 27.2–33.4)
MCHC: 34.2 g/dL (ref 32.0–36.0)
MCV: 80.4 fL (ref 79.3–98.0)
MONO#: 1.5 10*3/uL — AB (ref 0.1–0.9)
MONO%: 18.2 % — ABNORMAL HIGH (ref 0.0–14.0)
NEUT%: 73.4 % (ref 39.0–75.0)
NEUTROS ABS: 6.1 10*3/uL (ref 1.5–6.5)
PLATELETS: 225 10*3/uL (ref 140–400)
RBC: 3.66 10*6/uL — ABNORMAL LOW (ref 4.20–5.82)
RDW: 17.4 % — AB (ref 11.0–14.6)
WBC: 8.3 10*3/uL (ref 4.0–10.3)

## 2016-08-03 LAB — COMPREHENSIVE METABOLIC PANEL
ALBUMIN: 2.4 g/dL — AB (ref 3.5–5.0)
ALK PHOS: 75 U/L (ref 40–150)
ALT: 31 U/L (ref 0–55)
ANION GAP: 14 meq/L — AB (ref 3–11)
AST: 40 U/L — ABNORMAL HIGH (ref 5–34)
BILIRUBIN TOTAL: 1.27 mg/dL — AB (ref 0.20–1.20)
BUN: 28.2 mg/dL — ABNORMAL HIGH (ref 7.0–26.0)
CO2: 28 mEq/L (ref 22–29)
CREATININE: 1.2 mg/dL (ref 0.7–1.3)
Calcium: 8.3 mg/dL — ABNORMAL LOW (ref 8.4–10.4)
Chloride: 92 mEq/L — ABNORMAL LOW (ref 98–109)
EGFR: 60 mL/min/{1.73_m2} — AB (ref >90)
GLUCOSE: 67 mg/dL — AB (ref 70–140)
Potassium: 3.5 mEq/L (ref 3.5–5.1)
Sodium: 134 mEq/L — ABNORMAL LOW (ref 136–145)
TOTAL PROTEIN: 6.9 g/dL (ref 6.4–8.3)

## 2016-08-03 MED ORDER — OXYCODONE HCL 5 MG PO TABS: 5.0000 mg | ORAL_TABLET | Freq: Four times a day (QID) | ORAL | 0 refills | Status: DC | PRN

## 2016-08-03 NOTE — Telephone Encounter (Signed)
Avs report and appointment schedule given to patient per 08/03/16 los. °

## 2016-08-03 NOTE — Progress Notes (Signed)
[facility]  [phone] [phone]  Clinic Follow up Note   Patient Care Team: Tammi Sou, MD as PCP - General (Family Medicine) Ulyses Southward, MD as Consulting Physician (Allergy and Immunology) Calton Dach, MD as Consulting Physician (Optometry) Sueanne Margarita, MD as Consulting Physician (Cardiology) Jerene Bears, MD as Consulting Physician (Gastroenterology) Latanya Maudlin, MD as Consulting Physician (Orthopedic Surgery) Jodi Marble, MD as Consulting Physician (Otolaryngology) Truitt Merle, MD as Consulting Physician (Hematology) Kyung Rudd, MD as Consulting Physician (Radiation Oncology) 08/03/2016    CHIEF COMPLAINTS:  Follow up GE junction cancer  Oncology History   Cancer of cardio-esophageal junction [facility])   Staging form: Stomach, AJCC 7th Edition     Clinical stage from 05/04/2016: Stage IV (TX, N2, M1) - Signed by Truitt Merle, MD on 05/18/2016        Cancer of cardio-esophageal junction ([place])   05/04/2016 Initial Diagnosis    Cancer of cardio-esophageal junction ([place])      05/04/2016 Procedure    EGD showed a large ulcerating mass with no active bleeding at the gastroesophageal junction extending into the gastric cardia. The mass was not obstructing and partially circumferential, extends approximately 5 cm. Nonbleeding erosive gastropathy.      05/04/2016 Initial Biopsy    Esophageal gastric junction biopsy showed a poorly differentiated carcinoma underlying the squamous mucosa. There is lymphovascular invasion. No Intestinal metaplasia. IHC weakly positive CK5/6, p63 (-), favor squamous.        05/09/2016 Imaging    CT CAP w contrast showed mild wall thickening involving the distal esophagus and proximal stomach compatible with known cancer, enlarged mediastinal and upper abdominal lymph nodes are highly suspicious for metastatic adenopathy, propable liver cirrhosis.      06/04/2016 - 06/22/2016 Radiation Therapy   palliative radiation to esophageal cancer       07/06/2016 -  Chemotherapy    Weekly carbo and taxol       HISTORY OF PRESENTING ILLNESS:  [name] 70 y.o. male is here because of His reason that diagnosed GE junction carcinoma. He is a comment of by his wife and daughter to our multidisciplinary chart clinic today.  He has been having progressive dysphagia and odynophagia for 2 month, he has been eating soft diet only in the past one week. He has pain in the mid chest and epigastric area only when he eats, with burning sensation, no nausea, abdominal bloating, or other discomfort. His appetite has remained well, no weight loss,   He has multiple medical problems, especially heart failure, he has been on oxygen continuously for 5-6 years. He is morbidly obese, has diabetes, hypertension, mild neuropathy, OSA etc. He has very sedentary life style, he sits in the chair and watch TV most of time during the day. He only comes out for shopping for a few times a week. He uses a Barr[employer], he can walk for a few hundrends feet before he has to stop to catch his breasts. He denies cough or sputum production, no GI discomfort, he has been having mild dirrhea, loose stool, twice daily, no melena or hematochezia.  CURRENT THERAPY: chemotherapy with weekly carboplatin and taxol, started on 07/06/2016  INTERIM HISTORY: Mr. Cobbins returns for follow-up and Chemotherapy. He has developed worsening bilateral ankle and knee pain since last cycle chemotherapy 2 weeks ago. This progressively getting worse to the point he is not able to walk or stand up. He has been taking  tramadol, but it didn't control his pain well. He was little shakywhen I saw him. His dysphagia and odynophagia has improved lately, epigastric discomfort after meal has also improved. No other new complaints.  MEDICAL HISTORY:  Past Medical History:  Diagnosis Date  . Asthma    as a child  . Cataract    multiple types, bilateral    . Chronic diastolic CHF (congestive heart failure) ([place]) 02/25/2009  . Chronic renal insufficiency, stage III (moderate) 2017   Stage II/III (GFR around 60)  . 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 COPD (chronic obstructive pulmonary disease) ([place])   . Coronary artery disease    chronically occluded LAD and diagonal with right to left collaterals, mild disease in the left circ and moderate disease in the mid RCA on medical management with Imdur, ASA, and Plavix.  . Dermatitis 05/2014  . DIABETES MELLITUS, TYPE II 06/24/2007   No diab retpthy as of 08/05/15 eye exam.  . Esophageal cancer ([place]) 04/2016   poorly differentiated carcinoma (Dr. Pyrtle--EGD).  CT C/A/P showed metastatic adenopathy in mediastinum and upper abdomen 05/09/16.  Tx plan is palliative radiation (completed 06/22/16), then palliative systemic chemotherapy after he recovers from radiation.  . Essential hypertension 05/01/2007   Qualifier: Diagnosis of  By: Tiney Rouge CMA, Ellison Hughs     . History of kidney stones   . Hyperkalemia 12/2015   Decreased ACE-I by 50% in response, then potassium normalized.  Marland Kitchen HYPERLIPIDEMIA 06/24/2007  . HYPERTENSION 05/01/2007  . Hypoxemia 01/27/2010  . Myocardial infarction    pt states he was informed per MD that he has had one but pt was unaware   . OBESITY 09/02/2008  . Obesity hypoventilation syndrome (HCC)    oxygen 24/7 (2 liters [place] as of 02/2016)  . On home oxygen therapy    Oxygen @ 2l/m nasally 24/7 hours  . OSA (obstructive sleep apnea)    not tested; pt scored 4 per stop bang tool results sent to PCP   . OSTEOARTHRITIS 05/01/2007  . Presbycusis of both ears 05/2015   [facility]  . Pruritic condition 05/2014   Allergist summer 2015, no new testing.  [name] field defect 05/10/16   Per Dr. Melina Fiddler, O.D.: Rt, loss of inf/temp quad and some loss sup/temp.  ?CVA  ? Pituitary tumor? ?Brain met.    SURGICAL HISTORY: Past Surgical  History:  Procedure Laterality Date  . APPENDECTOMY    . BALLOON DILATION N/A 05/04/2016   Procedure: BALLOON DILATION;  Surgeon: Jerene Bears, MD;  Location: [facility];  Service: Gastroenterology;  Laterality: N/A;  . CARDIAC CATHETERIZATION    . CARPAL TUNNEL RELEASE Left   . CATARACT EXTRACTION W/PHACO Right 04/29/2013   Procedure: CATARACT EXTRACTION PHACO AND INTRAOCULAR LENS PLACEMENT (IOC);  Surgeon: Adonis Brook, MD;  Location: [facility];  Service: Ophthalmology;  Laterality: Right;  . CATARACT EXTRACTION, BILATERAL    . COLONOSCOPY W/ POLYPECTOMY  08/2011   Many polyps--all hyperplastic, severe diverticulosis, int hem.  [employer] hemoccult testing via [facility] 06/22/15 NEG  . ESOPHAGOGASTRODUODENOSCOPY N/A 05/04/2016   Procedure: ESOPHAGOGASTRODUODENOSCOPY (EGD);  Surgeon: Jerene Bears, MD;  Location: [facility];  Service: Gastroenterology;  Laterality: N/A;  . KNEE ARTHROSCOPY WITH MEDIAL MENISECTOMY Right 12/16/2014   Procedure: RIGHT KNEE ARTHROSCOPY WITH MEDIAL MENISECTOMY microfracture medial femoral condyle abrasion condroplasty medial femoral condyle lateral menisectomy;  Surgeon: Tobi Bastos, MD;  Location: [facility]  ORS;  Service: Orthopedics;  Laterality: Right;  . LEFT HEART CATHETERIZATION WITH CORONARY ANGIOGRAM N/A 10/26/2013   Procedure: LEFT HEART CATHETERIZATION WITH CORONARY ANGIOGRAM;  Surgeon: Jettie Booze, MD;  Location: [facility] CATH LAB;  Service: Cardiovascular;  Laterality: N/A;  . LUMBAR [place] SURGERY  2001  . SHOULDER SURGERY Right    right fx  . TONSILLECTOMY    . TRANSTHORACIC ECHOCARDIOGRAM  03/2016   EF 55-60%, grade I DD, LAE  . WRIST SURGERY Right    right fx    SOCIAL HISTORY: Social History   Social History  . Marital status: Married    Spouse name: susan  . Number of children: 2  . Years of education: N/A   Occupational History  . Retired    Social History Main Topics  . Smoking status: Former Smoker    Packs/day: 1.50    Years: 30.00     Types: Cigarettes, Pipe, Cigars    Quit date: 10/30/1983  . Smokeless tobacco: Former Systems developer    Types: Chew    Quit date: 05/15/2016  . Alcohol use No  . Drug use: No  . Sexual activity: Not on file   Other Topics Concern  . Not on file   Social History Narrative   Married, one son and one daughter.   His daughter and her two children live with him.   Coffee daily.  Former smoker.  No alcohol.   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   Attends church weekly-   Oxygen continuous       FAMILY HISTORY: Family History  Problem Relation Age of Onset  . Heart attack Mother   . Aneurysm Father   . Alcohol abuse Father   . Diabetes Maternal Aunt   . Colon cancer Neg Hx     ALLERGIES:  has No Known Allergies.  MEDICATIONS:  Current Outpatient Prescriptions  Medication Sig Dispense Refill  . aspirin 81 MG tablet Take 81 mg by mouth daily.      Marland Kitchen atorvastatin (LIPITOR) 80 MG tablet Take 1 tablet (80 mg total) by mouth daily. 90 tablet 3  . beta carotene w/minerals (OCUVITE) tablet Take 1 tablet by mouth daily.    . cetirizine (ZYRTEC) 10 MG tablet Take 10 mg by mouth daily.    . citalopram (CELEXA) 20 MG tablet Take 1 tablet (20 mg total) by mouth daily. 90 tablet 3  . clonazePAM (KLONOPIN) 1 MG tablet TAKE ONE TABLET BY MOUTH TWICE DAILY AS NEEDED FOR ANXIETY 60 tablet 5  . cromolyn (OPTICROM) 4 % ophthalmic solution Place 1 drop into both eyes 4 (four) times daily.    Marland Kitchen ezetimibe (ZETIA) 10 MG tablet Take 1 tablet (10 mg total) by mouth daily. 30 tablet 11  . gabapentin (NEURONTIN) 300 MG capsule TAKE ONE CAPSULE BY MOUTH THREE TIMES DAILY 270 capsule 3  . glucose blood (ONE TOUCH ULTRA TEST) test strip TEST 3 TIMES A DAY AS DIRECTED 100 each 11  . hydrOXYzine (ATARAX/VISTARIL) 25 MG tablet 1 tab at supper and 1 at bedtime (for insomnia) 60 tablet 11  . Insulin Lispro (HUMALOG KWIKPEN) 200 UNIT/ML SOPN Inject 10 Units into the skin 3 (three) times daily  before meals. 15 mL 6  . Insulin NPH, Human,, Isophane, (HUMULIN N KWIKPEN) 100 UNIT/ML Kiwkpen 90 U SQ qAM and 30 U SQ qSupper 60 mL 6  . isosorbide mononitrate (IMDUR) 60 MG 24 hr tablet Take 1 tablet (60 mg  total) by mouth at bedtime. 30 tablet 11  . metFORMIN (GLUCOPHAGE) 1000 MG tablet TAKE ONE TABLET BY MOUTH TWICE DAILY WITH MEALS 60 tablet 6  . Omega-3 Fatty Acids ([employer] FISH OIL) 1000 MG CAPS Take 2 tablets by mouth 2 (two) times daily. 90 capsule   . ondansetron (ZOFRAN) 8 MG tablet Take 1 tablet (8 mg total) by mouth 2 (two) times daily as needed for refractory nausea / vomiting. Start on day 3 after chemo. 30 tablet 1  . ONETOUCH DELICA LANCETS 38B MISC 1 each by Other route 3 (three) times daily. USE TO TEST 3 TIMES A DAY 100 each 11  . OXYGEN Inhale 2 L/min into the lungs continuous.    . pantoprazole (PROTONIX) 40 MG tablet Take 1 tablet (40 mg total) by mouth 2 (two) times daily. 60 tablet 3  . prochlorperazine (COMPAZINE) 10 MG tablet Take 1 tablet (10 mg total) by mouth every 6 (six) hours as needed (Nausea or vomiting). 30 tablet 1  . torsemide (DEMADEX) 20 MG tablet Take 1 tablet (20 mg total) by mouth daily. Take extra tab as directed for weight gain. 45 tablet 11  . traMADol (ULTRAM) 50 MG tablet Take 1 tablet (50 mg total) by mouth every 8 (eight) hours as needed. 60 tablet 1  . oxyCODONE (OXY IR/ROXICODONE) 5 MG immediate release tablet Take 1 tablet (5 mg total) by mouth every 6 (six) hours as needed for severe pain. 40 tablet 0   No current facility-administered medications for this visit.     REVIEW OF SYSTEMS:   Constitutional: Denies fevers, chills or abnormal night sweats   Eyes: Denies blurriness of vision, double vision or watery eyes Ears, nose, mouth, throat, and face: Denies mucositis or sore throat Respiratory: Denies cough, dyspnea or wheezes Cardiovascular: Denies palpitation, chest discomfort or lower extremity swelling Gastrointestinal:  Denies nausea,  heartburn or change in bowel habits Skin: Denies abnormal skin rashes Lymphatics: Denies new lymphadenopathy or easy bruising Neurological:Denies numbness, tingling or new weaknesses Behavioral/Psych: Mood is stable, no new changes  All other systems were reviewed with the patient and are negative.  PHYSICAL EXAMINATION: ECOG PERFORMANCE STATUS: 3 - Symptomatic, >50% confined to bed  Vitals:   08/03/16 0855  BP: 132/60  Pulse: (!) 111  Resp: 18  Temp: 98.9 F (37.2 C)   Filed Weights   08/03/16 0855  Weight: 285 lb 14.4 oz (129.7 kg)    GENERAL:alert, no distress and comfortable, morbid obese, sitting in wheelchair with nasal cannula oxygen SKIN: skin color, texture, turgor are normal, no rashes or significant lesions EYES: normal, conjunctiva are pink and non-injected, sclera clear OROPHARYNX:no exudate, no erythema and lips, buccal mucosa, and tongue normal  NECK: supple, thyroid normal size, non-tender, without nodularity LYMPH:  no palpable lymphadenopathy in the cervical, axillary or inguinal LUNGS: (+) Scatter rales on bilateral lung base, no wheezing HEART: regular rate & rhythm and no murmurs and no lower extremity edema ABDOMEN:abdomen soft, non-tender and normal bowel sounds Musculoskeletal:no cyanosis of digits and no clubbing  PSYCH: alert & oriented x 3 with fluent speech NEURO: no focal motor/sensory deficits EXT: Trace edema, he wears compression stocks, no significant ankle swollen, erythema, or warmness.  LABORATORY DATA:  I have reviewed the data as listed CBC Latest Ref Rng & Units 08/03/2016 07/20/2016 07/13/2016  WBC 4.0 - 10.3 10e3/uL 8.3 1.6(L) 3.6(L)  Hemoglobin 13.0 - 17.1 g/dL 10.1(L) 10.4(L) 12.2(L)  Hematocrit 38.4 - 49.9 % 29.5(L) 31.5(L) 37.6(L)  Platelets  140 - 400 10e3/uL 225 145 217   CMP Latest Ref Rng & Units 08/03/2016 07/20/2016 07/13/2016  Glucose 70 - 140 mg/dl 67(L) 170(H) 115  BUN 7.0 - 26.0 mg/dL 28.2(H) 15.4 21.4  Creatinine 0.7 -  1.3 mg/dL 1.2 1.1 1.1  Sodium 136 - 145 mEq/L 134(L) 135(L) 136  Potassium 3.5 - 5.1 mEq/L 3.5 3.5 4.5  Chloride 96 - 112 mEq/L - - -  CO2 22 - 29 mEq/L '28 25 25  ' Calcium 8.4 - 10.4 mg/dL 8.3(L) 9.0 9.2  Total Protein 6.4 - 8.3 g/dL 6.9 6.4 7.2  Total Bilirubin 0.20 - 1.20 mg/dL 1.27(H) 0.53 0.69  Alkaline Phos 40 - 150 U/L 75 55 62  AST 5 - 34 U/L 40(H) 34 36(H)  ALT 0 - 55 U/L 31 39 36    PATHOLOGY REPORT Diagnosis 05/04/2016 1. Esophagogastric junction, biopsy, ulcerative mass - POORLY DIFFERENTIATED CARCINOMA, SEE COMMENT. 2. Stomach, biopsy - REACTIVE GASTROPATHY. - NEGATIVE FOR HELICOBACTER PYLORI. - NO INTESTINAL METAPLASIA, DYSPLASIA, OR MALIGNANCY. Microscopic Comment 1. The majority of the specimen consists of gastroesophageal mucosa with reflux changes. There is a small focus of tumor underlying the squamous mucosa. There is lymphovascular invasion. There is no background intestinal metaplasia (Barrett's esophagus). Immunohistochemistry reveals only very focal weak cytokeratin 5/6, negative p63, and negative mucicarmine in the limited remaining tumor. Dr. Saralyn Pilar has reviewed the case. The case was called to Dr. Hilarie Fredrickson on 05/08/2016. 2. A Warthin-Starry stain is performed to determine the possibility of the presence of Helicobacter pylori. The Warthin-Starry stain is negative for organisms of Helicobacter pylori.  RADIOGRAPHIC STUDIES: I have personally reviewed the radiological images as listed and agreed with the findings in the report. No results found. EGD 05/04/2016 A large, ulcerating mass with no active bleeding and no stigmata of recent bleeding was found at the gastroesophageal junction extending into the gastric cardia, beginning 40 cm from the incisors. The mass was non-obstructing and partially circumferential (involving one-half of the lumen circumference). The mass extends approximately 5 cm. IMPRESSION:  -Likely malignant esophageal tumor was found at the  gastroesophageal junction extending into the gastric cardia. Biopsied. - Non-bleeding erosive gastropathy. Biopsied. - Erythematous duodenopathy. - Normal second portion of the duodenum.  ASSESSMENT & PLAN: 70 year old Caucasian male, with multiple comorbidities, including hypertension, diabetes, OSA, coronary artery disease, diastolic CHF, on continuous oxygen, morbid obesity, presented with progressive dysphagia and odynophagia.  1. Cancer of cardio-esophageal junction, poorly differentiated, cTxN2M1, stage IV  -I reviewed his CT scan, EGD, and the biopsy results with patient and his family members in great details. -His biopsy pathology was reviewed in our tumor board 2 days ago, the morphology and weakly positive for CK 5/6, fevers squamous cell carcinoma -His CT scan findings are very concerning for distant metastasis to abdominal lymph nodes.  -I reviewed the PET scan images with patient and his daughter in person today, he has hypermetabolic GE junction tumor, and diffuse adenopathy in mediastinum and upper abdomen. -We discussed the aggressive nature of esophageal cancer, an incurable nature of his disease due to the distant metastasis. The goal of therapy is palliative, to prolong his life and palliate his symptoms. -He has completed palliative radiation -He received weekly carbo and Taxol. Weeks, and developed severe worsening arthralgia in his knees and ankles, cannot walk, could be related to Taxol, I will hold on chemo for now -We discussed that it could consider was reviewed and approved for gastric and GE junction cancer if her tumor expressed PDL  1 -Due to his poor tolerance to chemotherapy, I'll request his tumor to be tested for PDL 1, to see if he is a candidate for immunotherapy with Keytruda   2. CAD, diastolic CHF, on continuous oxygen -He will continue follow-up with his cardiologist Dr. Radford Pax -His Plavix has been held since the EGD and biopsy, he is on baby  aspirin. -Need to watch his fluid intake during his chemotherapy, and also avoid fluid overload from chemo  -he is back on lasix, dsypnea improved   3. DM, HTN -We discussed his blood pressure and glucose need to be monitored closely during the chemotherapy, which may affect his blood glucose and blood pressure -He will continue medication, monitor his sugar and blood pressure at home, and follow-up with his primary care physician -will decrease premed dexa before taxol  -History simple has been held due to his borderline low blood pressure -His insulin has been decreased due to decreased by mouth intake -She'll continue follow-up with his primary care physician  4. OSA, morbid obesity -We discussed healthy diet, I encouraged him to to be more physically active, and consider home PT -He will be seen by our dietitian Pamala Hurry today. I strongly encouraged him to follow-up with dietitian during the therapy for nutrition support.  5. Epigastric pain -Likely secondary to his underlying malignancy -worse after radiation, improved daily -continue tramadol as need   6. Arthralgia -He has baseline osteoarthritis, not physically active -However his arthralgia in his knees and ankle are much worse after chemotherapy -I give him a prescription of oxycodone 5 mg as needed today, constipation management reviewed with him -I encouraged him to follow-up with his orthopedic surgeon to see if he would benefit from steroids injection  Plan -will hold his chemo due to debilitating arthralgia -Oxycodone 5 mg every 6 hours as needed for pain -will test his tumor for PD-L1 -I will see him back on 10/18  All questions were answered. The patient knows to call the clinic with any problems, questions or concerns. I spent 20 minutes counseling the patient face to face. The total time spent in the appointment was 25 minutes and more than 50% was on counseling.     Truitt Merle, MD 08/03/2016

## 2016-08-03 NOTE — Telephone Encounter (Signed)
Called pt & informed of low glucose of 67 & made sure he was watching this & checking blood sugars & eating.  He reports that he ate on the way home & was checking sugars.

## 2016-08-07 DIAGNOSIS — J449 Chronic obstructive pulmonary disease, unspecified: Secondary | ICD-10-CM | POA: Diagnosis not present

## 2016-08-08 ENCOUNTER — Encounter: Payer: Self-pay | Admitting: Family Medicine

## 2016-08-15 ENCOUNTER — Ambulatory Visit (HOSPITAL_BASED_OUTPATIENT_CLINIC_OR_DEPARTMENT_OTHER): Payer: Medicare Other | Admitting: Hematology

## 2016-08-15 ENCOUNTER — Telehealth: Payer: Self-pay | Admitting: Hematology

## 2016-08-15 ENCOUNTER — Other Ambulatory Visit (HOSPITAL_BASED_OUTPATIENT_CLINIC_OR_DEPARTMENT_OTHER): Payer: Medicare Other

## 2016-08-15 VITALS — BP 116/60 | HR 106 | Temp 98.0°F | Resp 20 | Ht 69.0 in | Wt 280.0 lb

## 2016-08-15 DIAGNOSIS — I5030 Unspecified diastolic (congestive) heart failure: Secondary | ICD-10-CM | POA: Diagnosis not present

## 2016-08-15 DIAGNOSIS — C160 Malignant neoplasm of cardia: Secondary | ICD-10-CM

## 2016-08-15 DIAGNOSIS — E119 Type 2 diabetes mellitus without complications: Secondary | ICD-10-CM | POA: Diagnosis not present

## 2016-08-15 DIAGNOSIS — I10 Essential (primary) hypertension: Secondary | ICD-10-CM | POA: Diagnosis not present

## 2016-08-15 LAB — CBC WITH DIFFERENTIAL/PLATELET
BASO%: 0.7 % (ref 0.0–2.0)
Basophils Absolute: 0.1 10*3/uL (ref 0.0–0.1)
EOS%: 5 % (ref 0.0–7.0)
Eosinophils Absolute: 0.3 10*3/uL (ref 0.0–0.5)
HEMATOCRIT: 32.1 % — AB (ref 38.4–49.9)
HEMOGLOBIN: 10 g/dL — AB (ref 13.0–17.1)
LYMPH#: 1.3 10*3/uL (ref 0.9–3.3)
LYMPH%: 19.7 % (ref 14.0–49.0)
MCH: 27.5 pg (ref 27.2–33.4)
MCHC: 31.2 g/dL — ABNORMAL LOW (ref 32.0–36.0)
MCV: 88.2 fL (ref 79.3–98.0)
MONO#: 0.8 10*3/uL (ref 0.1–0.9)
MONO%: 12 % (ref 0.0–14.0)
NEUT%: 62.6 % (ref 39.0–75.0)
NEUTROS ABS: 4.2 10*3/uL (ref 1.5–6.5)
PLATELETS: 177 10*3/uL (ref 140–400)
RBC: 3.64 10*6/uL — ABNORMAL LOW (ref 4.20–5.82)
RDW: 19.7 % — AB (ref 11.0–14.6)
WBC: 6.8 10*3/uL (ref 4.0–10.3)

## 2016-08-15 LAB — COMPREHENSIVE METABOLIC PANEL
ALT: 27 U/L (ref 0–55)
ANION GAP: 10 meq/L (ref 3–11)
AST: 37 U/L — ABNORMAL HIGH (ref 5–34)
Albumin: 2.5 g/dL — ABNORMAL LOW (ref 3.5–5.0)
Alkaline Phosphatase: 91 U/L (ref 40–150)
BILIRUBIN TOTAL: 0.66 mg/dL (ref 0.20–1.20)
BUN: 10.9 mg/dL (ref 7.0–26.0)
CALCIUM: 8.7 mg/dL (ref 8.4–10.4)
CO2: 29 mEq/L (ref 22–29)
CREATININE: 0.9 mg/dL (ref 0.7–1.3)
Chloride: 100 mEq/L (ref 98–109)
EGFR: 82 mL/min/{1.73_m2} — ABNORMAL LOW (ref >90)
Glucose: 174 mg/dl — ABNORMAL HIGH (ref 70–140)
Potassium: 4 mEq/L (ref 3.5–5.1)
Sodium: 138 mEq/L (ref 136–145)
TOTAL PROTEIN: 6.3 g/dL — AB (ref 6.4–8.3)

## 2016-08-15 NOTE — Telephone Encounter (Signed)
Gave patient avs report and appointments for October thru December. Infusion capped 10/19, 10/20 and 10/23. Per YF ok to start 10/24 and q3w. Appointments scheduled per instruction on los - care plan not yet updated.

## 2016-08-15 NOTE — Progress Notes (Signed)
[place]  [phone] [phone]  Clinic Follow up Note   Patient Care Team: Tammi Sou, MD as PCP - General (Family Medicine) Ulyses Southward, MD as Consulting Physician (Allergy and Immunology) Calton Dach, MD as Consulting Physician (Optometry) Sueanne Margarita, MD as Consulting Physician (Cardiology) Jerene Bears, MD as Consulting Physician (Gastroenterology) Latanya Maudlin, MD as Consulting Physician (Orthopedic Surgery) Jodi Marble, MD as Consulting Physician (Otolaryngology) Truitt Merle, MD as Consulting Physician (Hematology) Kyung Rudd, MD as Consulting Physician (Radiation Oncology) 08/15/2016    CHIEF COMPLAINTS:  Follow up GE junction cancer  Oncology History   Cancer of cardio-esophageal junction [facility])   Staging form: Stomach, AJCC 7th Edition     Clinical stage from 05/04/2016: Stage IV (TX, N2, M1) - Signed by Truitt Merle, MD on 05/18/2016        Cancer of cardio-esophageal junction ([place])   05/04/2016 Initial Diagnosis    Cancer of cardio-esophageal junction ([place])      05/04/2016 Procedure    EGD showed a large ulcerating mass with no active bleeding at the gastroesophageal junction extending into the gastric cardia. The mass was not obstructing and partially circumferential, extends approximately 5 cm. Nonbleeding erosive gastropathy.      05/04/2016 Initial Biopsy    Esophageal gastric junction biopsy showed a poorly differentiated carcinoma underlying the squamous mucosa. There is lymphovascular invasion. No Intestinal metaplasia. IHC weakly positive CK5/6, p63 (-), favor squamous.        05/09/2016 Imaging    CT CAP w contrast showed mild wall thickening involving the distal esophagus and proximal stomach compatible with known cancer, enlarged mediastinal and upper abdominal lymph nodes are highly suspicious for metastatic adenopathy, propable liver cirrhosis.      06/04/2016 - 06/22/2016 Radiation Therapy   palliative radiation to esophageal cancer       07/06/2016 -  Chemotherapy    Weekly carbo and taxol       HISTORY OF PRESENTING ILLNESS:  [name] 70 y.o. male is here because of His reason that diagnosed GE junction carcinoma. He is a comment of by his wife and daughter to our multidisciplinary chart clinic today.  He has been having progressive dysphagia and odynophagia for 2 month, he has been eating soft diet only in the past one week. He has pain in the mid chest and epigastric area only when he eats, with burning sensation, no nausea, abdominal bloating, or other discomfort. His appetite has remained well, no weight loss,   He has multiple medical problems, especially heart failure, he has been on oxygen continuously for 5-6 years. He is morbidly obese, has diabetes, hypertension, mild neuropathy, OSA etc. He has very sedentary life style, he sits in the chair and watch TV most of time during the day. He only comes out for shopping for a few times a week. He uses a Barr[employer], he can walk for a few hundrends feet before he has to stop to catch his breasts. He denies cough or sputum production, no GI discomfort, he has been having mild dirrhea, loose stool, twice daily, no melena or hematochezia.  CURRENT THERAPY: chemotherapy with weekly carboplatin and taxol, started on 07/06/2016  INTERIM HISTORY: [name] returns for follow-up. His leg pain has resolved after taking pain medication for 3-4 days. He feels he is back to his baseline now. He overall feels better, he is able to walk with a walker again. No other new complaints  MEDICAL HISTORY:  Past Medical History:  Diagnosis Date  . Asthma    as a child  . Cataract    multiple types, bilateral  . Chronic diastolic CHF (congestive heart failure) ([place]) 02/25/2009  . Chronic renal insufficiency, stage III (moderate) 2017   Stage II/III (GFR around 60)  . 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 COPD (chronic obstructive pulmonary disease) ([place])   . Coronary artery disease    chronically occluded LAD and diagonal with right to left collaterals, mild disease in the left circ and moderate disease in the mid RCA on medical management with Imdur, ASA, and Plavix.  . Dermatitis 05/2014  . DIABETES MELLITUS, TYPE II 06/24/2007   No diab retpthy as of 08/05/15 eye exam.  . Esophageal cancer ([place]) 04/2016   poorly differentiated carcinoma (Dr. Pyrtle--EGD).  CT C/A/P showed metastatic adenopathy in mediastinum and upper abdomen 05/09/16.  Tx plan is palliative radiation (completed 06/22/16), then palliative systemic chemotherapy (taxol) was started but as of 08/03/16 onc f/u this was held due to severe knee and ankle arthralgias.  . Essential hypertension 05/01/2007   Qualifier: Diagnosis of  By: Tiney Rouge CMA, Ellison Hughs     . History of kidney stones   . Hyperkalemia 12/2015   Decreased ACE-I by 50% in response, then potassium normalized.  Marland Kitchen HYPERLIPIDEMIA 06/24/2007  . HYPERTENSION 05/01/2007  . Hypoxemia 01/27/2010  . Myocardial infarction    pt states he was informed per MD that he has had one but pt was unaware   . OBESITY 09/02/2008  . Obesity hypoventilation syndrome (HCC)    oxygen 24/7 (2 liters [place] as of 02/2016)  . On home oxygen therapy    Oxygen @ 2l/m nasally 24/7 hours  . OSA (obstructive sleep apnea)    not tested; pt scored 4 per stop bang tool results sent to PCP   . OSTEOARTHRITIS 05/01/2007  . Presbycusis of both ears 05/2015   [facility]  . Pruritic condition 05/2014   Allergist summer 2015, no new testing.  [name] field defect 05/10/16   Per Dr. Melina Fiddler, O.D.: Rt, loss of inf/temp quad and some loss sup/temp.  ?CVA  ? Pituitary tumor? ?Brain met.    SURGICAL HISTORY: Past Surgical History:  Procedure Laterality Date  . APPENDECTOMY    . BALLOON DILATION N/A 05/04/2016   Procedure: BALLOON DILATION;  Surgeon: Jerene Bears, MD;  Location: [facility]  ENDOSCOPY;  Service: Gastroenterology;  Laterality: N/A;  . CARDIAC CATHETERIZATION    . CARPAL TUNNEL RELEASE Left   . CATARACT EXTRACTION W/PHACO Right 04/29/2013   Procedure: CATARACT EXTRACTION PHACO AND INTRAOCULAR LENS PLACEMENT (IOC);  Surgeon: Adonis Brook, MD;  Location: [place];  Service: Ophthalmology;  Laterality: Right;  . CATARACT EXTRACTION, BILATERAL    . COLONOSCOPY W/ POLYPECTOMY  08/2011   Many polyps--all hyperplastic, severe diverticulosis, int hem.  [employer] hemoccult testing via [facility] 06/22/15 NEG  . ESOPHAGOGASTRODUODENOSCOPY N/A 05/04/2016   Procedure: ESOPHAGOGASTRODUODENOSCOPY (EGD);  Surgeon: Jerene Bears, MD;  Location: [facility];  Service: Gastroenterology;  Laterality: N/A;  . KNEE ARTHROSCOPY WITH MEDIAL MENISECTOMY Right 12/16/2014   Procedure: RIGHT KNEE ARTHROSCOPY WITH MEDIAL MENISECTOMY microfracture medial femoral condyle abrasion condroplasty medial femoral condyle lateral menisectomy;  Surgeon: Tobi Bastos, MD;  Location: [facility] ORS;  Service: Orthopedics;  Laterality: Right;  . LEFT HEART CATHETERIZATION WITH CORONARY ANGIOGRAM N/A 10/26/2013   Procedure: LEFT  HEART CATHETERIZATION WITH CORONARY ANGIOGRAM;  Surgeon: Jettie Booze, MD;  Location: [facility] CATH LAB;  Service: Cardiovascular;  Laterality: N/A;  . LUMBAR [place] SURGERY  2001  . SHOULDER SURGERY Right    right fx  . TONSILLECTOMY    . TRANSTHORACIC ECHOCARDIOGRAM  03/2016   EF 55-60%, grade I DD, LAE  . WRIST SURGERY Right    right fx    SOCIAL HISTORY: Social History   Social History  . Marital status: Married    Spouse name: susan  . Number of children: 2  . Years of education: N/A   Occupational History  . Retired    Social History Main Topics  . Smoking status: Former Smoker    Packs/day: 1.50    Years: 30.00    Types: Cigarettes, Pipe, Cigars    Quit date: 10/30/1983  . Smokeless tobacco: Former Systems developer    Types: Chew    Quit date: 05/15/2016  . Alcohol use No  . Drug use: No    . Sexual activity: Not on file   Other Topics Concern  . Not on file   Social History Narrative   Married, one son and one daughter.   His daughter and her two children live with him.   Coffee daily.  Former smoker.  No alcohol.   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   Attends church weekly-   Oxygen continuous       FAMILY HISTORY: Family History  Problem Relation Age of Onset  . Heart attack Mother   . Aneurysm Father   . Alcohol abuse Father   . Diabetes Maternal Aunt   . Colon cancer Neg Hx     ALLERGIES:  has No Known Allergies.  MEDICATIONS:  Current Outpatient Prescriptions  Medication Sig Dispense Refill  . aspirin 81 MG tablet Take 81 mg by mouth daily.      Marland Kitchen atorvastatin (LIPITOR) 80 MG tablet Take 1 tablet (80 mg total) by mouth daily. 90 tablet 3  . beta carotene w/minerals (OCUVITE) tablet Take 1 tablet by mouth daily.    . cetirizine (ZYRTEC) 10 MG tablet Take 10 mg by mouth daily.    . citalopram (CELEXA) 20 MG tablet Take 1 tablet (20 mg total) by mouth daily. 90 tablet 3  . clonazePAM (KLONOPIN) 1 MG tablet TAKE ONE TABLET BY MOUTH TWICE DAILY AS NEEDED FOR ANXIETY 60 tablet 5  . cromolyn (OPTICROM) 4 % ophthalmic solution Place 1 drop into both eyes 4 (four) times daily.    Marland Kitchen ezetimibe (ZETIA) 10 MG tablet Take 1 tablet (10 mg total) by mouth daily. 30 tablet 11  . gabapentin (NEURONTIN) 300 MG capsule TAKE ONE CAPSULE BY MOUTH THREE TIMES DAILY 270 capsule 3  . glucose blood (ONE TOUCH ULTRA TEST) test strip TEST 3 TIMES A DAY AS DIRECTED 100 each 11  . hydrOXYzine (ATARAX/VISTARIL) 25 MG tablet 1 tab at supper and 1 at bedtime (for insomnia) 60 tablet 11  . Insulin Lispro (HUMALOG KWIKPEN) 200 UNIT/ML SOPN Inject 10 Units into the skin 3 (three) times daily before meals. 15 mL 6  . Insulin NPH, Human,, Isophane, (HUMULIN N KWIKPEN) 100 UNIT/ML Kiwkpen 90 U SQ qAM and 30 U SQ qSupper 60 mL 6  . isosorbide mononitrate  (IMDUR) 60 MG 24 hr tablet Take 1 tablet (60 mg total) by mouth at bedtime. 30 tablet 11  . metFORMIN (GLUCOPHAGE) 1000 MG tablet TAKE ONE TABLET BY MOUTH  TWICE DAILY WITH MEALS 60 tablet 6  . Omega-3 Fatty Acids ([employer] FISH OIL) 1000 MG CAPS Take 2 tablets by mouth 2 (two) times daily. 90 capsule   . ondansetron (ZOFRAN) 8 MG tablet Take 1 tablet (8 mg total) by mouth 2 (two) times daily as needed for refractory nausea / vomiting. Start on day 3 after chemo. 30 tablet 1  . ONETOUCH DELICA LANCETS 44H MISC 1 each by Other route 3 (three) times daily. USE TO TEST 3 TIMES A DAY 100 each 11  . oxyCODONE (OXY IR/ROXICODONE) 5 MG immediate release tablet Take 1 tablet (5 mg total) by mouth every 6 (six) hours as needed for severe pain. 40 tablet 0  . OXYGEN Inhale 2 L/min into the lungs continuous.    . pantoprazole (PROTONIX) 40 MG tablet Take 1 tablet (40 mg total) by mouth 2 (two) times daily. 60 tablet 3  . prochlorperazine (COMPAZINE) 10 MG tablet Take 1 tablet (10 mg total) by mouth every 6 (six) hours as needed (Nausea or vomiting). 30 tablet 1  . torsemide (DEMADEX) 20 MG tablet Take 1 tablet (20 mg total) by mouth daily. Take extra tab as directed for weight gain. 45 tablet 11  . traMADol (ULTRAM) 50 MG tablet Take 1 tablet (50 mg total) by mouth every 8 (eight) hours as needed. 60 tablet 1   No current facility-administered medications for this visit.     REVIEW OF SYSTEMS:   Constitutional: Denies fevers, chills or abnormal night sweats   Eyes: Denies blurriness of vision, double vision or watery eyes Ears, nose, mouth, throat, and face: Denies mucositis or sore throat Respiratory: Denies cough, dyspnea or wheezes Cardiovascular: Denies palpitation, chest discomfort or lower extremity swelling Gastrointestinal:  Denies nausea, heartburn or change in bowel habits Skin: Denies abnormal skin rashes Lymphatics: Denies new lymphadenopathy or easy bruising Neurological:Denies numbness, tingling  or new weaknesses Behavioral/Psych: Mood is stable, no new changes  All other systems were reviewed with the patient and are negative.  PHYSICAL EXAMINATION: ECOG PERFORMANCE STATUS: 3 - Symptomatic, >50% confined to bed  Vitals:   08/15/16 1214  BP: 116/60  Pulse: (!) 106  Resp: 20  Temp: 98 F (36.7 C)   Filed Weights   08/15/16 1214  Weight: 280 lb (127 kg)    GENERAL:alert, no distress and comfortable, morbid obese, sitting in wheelchair with nasal cannula oxygen SKIN: skin color, texture, turgor are normal, no rashes or significant lesions EYES: normal, conjunctiva are pink and non-injected, sclera clear OROPHARYNX:no exudate, no erythema and lips, buccal mucosa, and tongue normal  NECK: supple, thyroid normal size, non-tender, without nodularity LYMPH:  no palpable lymphadenopathy in the cervical, axillary or inguinal LUNGS: (+) Scatter rales on bilateral lung base, no wheezing HEART: regular rate & rhythm and no murmurs and no lower extremity edema ABDOMEN:abdomen soft, non-tender and normal bowel sounds Musculoskeletal:no cyanosis of digits and no clubbing  PSYCH: alert & oriented x 3 with fluent speech NEURO: no focal motor/sensory deficits EXT: Trace edema, he wears compression stocks, no significant ankle swollen, erythema, or warmness.  LABORATORY DATA:  I have reviewed the data as listed CBC Latest Ref Rng & Units 08/15/2016 08/03/2016 07/20/2016  WBC 4.0 - 10.3 10e3/uL 6.8 8.3 1.6(L)  Hemoglobin 13.0 - 17.1 g/dL 10.0(L) 10.1(L) 10.4(L)  Hematocrit 38.4 - 49.9 % 32.1(L) 29.5(L) 31.5(L)  Platelets 140 - 400 10e3/uL 177 225 145   CMP Latest Ref Rng & Units 08/15/2016 08/03/2016 07/20/2016  Glucose 70 -  140 mg/dl 174(H) 67(L) 170(H)  BUN 7.0 - 26.0 mg/dL 10.9 28.2(H) 15.4  Creatinine 0.7 - 1.3 mg/dL 0.9 1.2 1.1  Sodium 136 - 145 mEq/L 138 134(L) 135(L)  Potassium 3.5 - 5.1 mEq/L 4.0 3.5 3.5  Chloride 96 - 112 mEq/L - - -  CO2 22 - 29 mEq/L '29 28 25  '$ Calcium 8.4  - 10.4 mg/dL 8.7 8.3(L) 9.0  Total Protein 6.4 - 8.3 g/dL 6.3(L) 6.9 6.4  Total Bilirubin 0.20 - 1.20 mg/dL 0.66 1.27(H) 0.53  Alkaline Phos 40 - 150 U/L 91 75 55  AST 5 - 34 U/L 37(H) 40(H) 34  ALT 0 - 55 U/L 27 31 39    PATHOLOGY REPORT Diagnosis 05/04/2016 1. Esophagogastric junction, biopsy, ulcerative mass - POORLY DIFFERENTIATED CARCINOMA, SEE COMMENT. 2. Stomach, biopsy - REACTIVE GASTROPATHY. - NEGATIVE FOR HELICOBACTER PYLORI. - NO INTESTINAL METAPLASIA, DYSPLASIA, OR MALIGNANCY. Microscopic Comment 1. The majority of the specimen consists of gastroesophageal mucosa with reflux changes. There is a small focus of tumor underlying the squamous mucosa. There is lymphovascular invasion. There is no background intestinal metaplasia (Barrett's esophagus). Immunohistochemistry reveals only very focal weak cytokeratin 5/6, negative p63, and negative mucicarmine in the limited remaining tumor. Dr. Saralyn Pilar has reviewed the case. The case was called to Dr. Hilarie Fredrickson on 05/08/2016. 2. A Warthin-Starry stain is performed to determine the possibility of the presence of Helicobacter pylori. The Warthin-Starry stain is negative for organisms of Helicobacter pylori.  RADIOGRAPHIC STUDIES: I have personally reviewed the radiological images as listed and agreed with the findings in the report. No results found. EGD 05/04/2016 A large, ulcerating mass with no active bleeding and no stigmata of recent bleeding was found at the gastroesophageal junction extending into the gastric cardia, beginning 40 cm from the incisors. The mass was non-obstructing and partially circumferential (involving one-half of the lumen circumference). The mass extends approximately 5 cm. IMPRESSION:  -Likely malignant esophageal tumor was found at the gastroesophageal junction extending into the gastric cardia. Biopsied. - Non-bleeding erosive gastropathy. Biopsied. - Erythematous duodenopathy. - Normal second portion of  the duodenum.  ASSESSMENT & PLAN: 70 year old Caucasian male, with multiple comorbidities, including hypertension, diabetes, OSA, coronary artery disease, diastolic CHF, on continuous oxygen, morbid obesity, presented with progressive dysphagia and odynophagia.  1. Cancer of cardio-esophageal junction, poorly differentiated, cTxN2M1, stage IV  -I reviewed his CT scan, EGD, and the biopsy results with patient and his family members in great details. -His biopsy pathology was reviewed in our tumor board 2 days ago, the morphology and weakly positive for CK 5/6, fevers squamous cell carcinoma -His CT scan findings are very concerning for distant metastasis to abdominal lymph nodes.  -I reviewed the PET scan images with patient and his daughter in person, he has hypermetabolic GE junction tumor, and diffuse adenopathy in mediastinum and upper abdomen. -We discussed the aggressive nature of esophageal cancer, an incurable nature of his disease due to the distant metastasis. The goal of therapy is palliative, to prolong his life and palliate his symptoms. -He has completed palliative radiation -He received weekly carbo and Taxol. Weeks, and developed severe worsening arthralgia in his knees and ankles, cannot walk, could be related to Taxol, resolved after taking pain meds for 4-5 days -We'll restart chemotherapy, I'll change his [place] and Taxol to every 3 weeks,, with carbo AUC 4, Taxol 1 50 mg/m -I will request his tumor to be tested for PD-L1   2. CAD, diastolic CHF, on continuous oxygen -He will continue  follow-up with his cardiologist Dr. Radford Pax -His Plavix has been held since the EGD and biopsy, he is on baby aspirin. -Need to watch his fluid intake during his chemotherapy, and also avoid fluid overload from chemo  -he is back on lasix, dsypnea improved   3. DM, HTN -We discussed his blood pressure and glucose need to be monitored closely during the chemotherapy, which may affect his blood  glucose and blood pressure -He will continue medication, monitor his sugar and blood pressure at home, and follow-up with his primary care physician -will decrease premed dexa before taxol  -History simple has been held due to his borderline low blood pressure -His insulin has been decreased due to decreased by mouth intake -She'll continue follow-up with his primary care physician  4. OSA, morbid obesity -We discussed healthy diet, I encouraged him to to be more physically active, and consider home PT -He will be seen by our dietitian Pamala Hurry today. I strongly encouraged him to follow-up with dietitian during the therapy for nutrition support.  5. Epigastric pain -Likely secondary to his underlying malignancy -worse after radiation, improved daily -continue tramadol as need   6. Arthralgia -He has baseline osteoarthritis, not physically active -However his arthralgia in his knees and ankle are much worse after chemotherapy -He will use oxycodone 5 mg as needed today, constipation management reviewed with him  7. Code status  -Her discussed the goal of care, which is palliative. -Patient is very realistic, understands his cancer is not curable, and he agrees with DO NOT RESUSCITATE and DO NOT INTUBATE  Plan -restart chemo, change carbo  AUC 4, Taxol 150 mg/m, every 3 weeks, start as soon as possible -Return to clinic in 1 week after next cycle chemotherapy, and I'll see him back before next cycle  All questions were answered. The patient knows to call the clinic with any problems, questions or concerns. I spent 30 minutes counseling the patient face to face. The total time spent in the appointment was 25 minutes and more than 35% was on counseling.     Truitt Merle, MD 08/15/2016

## 2016-08-16 ENCOUNTER — Encounter: Payer: Self-pay | Admitting: Hematology

## 2016-08-16 ENCOUNTER — Encounter (HOSPITAL_COMMUNITY): Payer: Self-pay

## 2016-08-16 ENCOUNTER — Telehealth: Payer: Self-pay

## 2016-08-16 NOTE — Telephone Encounter (Signed)
FMLA papers filled out , copy placed at front desk for pt to pick up as requested, pt notified.

## 2016-08-21 ENCOUNTER — Encounter: Payer: Self-pay | Admitting: *Deleted

## 2016-08-21 ENCOUNTER — Encounter: Payer: Self-pay | Admitting: Family Medicine

## 2016-08-21 ENCOUNTER — Ambulatory Visit (HOSPITAL_BASED_OUTPATIENT_CLINIC_OR_DEPARTMENT_OTHER): Payer: Medicare Other

## 2016-08-21 ENCOUNTER — Other Ambulatory Visit (HOSPITAL_BASED_OUTPATIENT_CLINIC_OR_DEPARTMENT_OTHER): Payer: Medicare Other

## 2016-08-21 VITALS — BP 108/66 | HR 99 | Temp 98.1°F | Resp 18

## 2016-08-21 DIAGNOSIS — Z5111 Encounter for antineoplastic chemotherapy: Secondary | ICD-10-CM

## 2016-08-21 DIAGNOSIS — C160 Malignant neoplasm of cardia: Secondary | ICD-10-CM

## 2016-08-21 LAB — COMPREHENSIVE METABOLIC PANEL
ALT: 24 U/L (ref 0–55)
ANION GAP: 12 meq/L — AB (ref 3–11)
AST: 32 U/L (ref 5–34)
Albumin: 2.8 g/dL — ABNORMAL LOW (ref 3.5–5.0)
Alkaline Phosphatase: 92 U/L (ref 40–150)
BUN: 12.2 mg/dL (ref 7.0–26.0)
CHLORIDE: 98 meq/L (ref 98–109)
CO2: 28 meq/L (ref 22–29)
CREATININE: 1 mg/dL (ref 0.7–1.3)
Calcium: 9 mg/dL (ref 8.4–10.4)
EGFR: 79 mL/min/{1.73_m2} — ABNORMAL LOW (ref >90)
GLUCOSE: 144 mg/dL — AB (ref 70–140)
Potassium: 3.9 mEq/L (ref 3.5–5.1)
SODIUM: 138 meq/L (ref 136–145)
Total Bilirubin: 0.69 mg/dL (ref 0.20–1.20)
Total Protein: 6.8 g/dL (ref 6.4–8.3)

## 2016-08-21 LAB — CBC WITH DIFFERENTIAL/PLATELET
BASO%: 1.1 % (ref 0.0–2.0)
Basophils Absolute: 0.1 10*3/uL (ref 0.0–0.1)
EOS%: 12.7 % — AB (ref 0.0–7.0)
Eosinophils Absolute: 0.9 10*3/uL — ABNORMAL HIGH (ref 0.0–0.5)
HCT: 32.8 % — ABNORMAL LOW (ref 38.4–49.9)
HGB: 10.5 g/dL — ABNORMAL LOW (ref 13.0–17.1)
LYMPH%: 17 % (ref 14.0–49.0)
MCH: 27.4 pg (ref 27.2–33.4)
MCHC: 32 g/dL (ref 32.0–36.0)
MCV: 85.7 fL (ref 79.3–98.0)
MONO#: 1 10*3/uL — ABNORMAL HIGH (ref 0.1–0.9)
MONO%: 14.4 % — AB (ref 0.0–14.0)
NEUT#: 3.8 10*3/uL (ref 1.5–6.5)
NEUT%: 54.8 % (ref 39.0–75.0)
PLATELETS: 199 10*3/uL (ref 140–400)
RBC: 3.82 10*6/uL — AB (ref 4.20–5.82)
RDW: 20.5 % — ABNORMAL HIGH (ref 11.0–14.6)
WBC: 7 10*3/uL (ref 4.0–10.3)
lymph#: 1.2 10*3/uL (ref 0.9–3.3)

## 2016-08-21 MED ORDER — PACLITAXEL CHEMO INJECTION 300 MG/50ML
150.0000 mg/m2 | Freq: Once | INTRAVENOUS | Status: AC
  Administered 2016-08-21: 384 mg via INTRAVENOUS
  Filled 2016-08-21: qty 64

## 2016-08-21 MED ORDER — SODIUM CHLORIDE 0.9 % IV SOLN
Freq: Once | INTRAVENOUS | Status: AC
  Administered 2016-08-21: 13:00:00 via INTRAVENOUS

## 2016-08-21 MED ORDER — PALONOSETRON HCL INJECTION 0.25 MG/5ML
INTRAVENOUS | Status: AC
  Filled 2016-08-21: qty 5

## 2016-08-21 MED ORDER — DEXAMETHASONE SODIUM PHOSPHATE 10 MG/ML IJ SOLN
10.0000 mg | Freq: Once | INTRAMUSCULAR | Status: AC
  Administered 2016-08-21: 10 mg via INTRAVENOUS

## 2016-08-21 MED ORDER — FAMOTIDINE IN NACL 20-0.9 MG/50ML-% IV SOLN
20.0000 mg | Freq: Once | INTRAVENOUS | Status: AC
  Administered 2016-08-21: 20 mg via INTRAVENOUS

## 2016-08-21 MED ORDER — DEXAMETHASONE SODIUM PHOSPHATE 10 MG/ML IJ SOLN
INTRAMUSCULAR | Status: AC
  Filled 2016-08-21: qty 1

## 2016-08-21 MED ORDER — DIPHENHYDRAMINE HCL 50 MG/ML IJ SOLN
INTRAMUSCULAR | Status: AC
  Filled 2016-08-21: qty 1

## 2016-08-21 MED ORDER — SODIUM CHLORIDE 0.9 % IV SOLN
600.0000 mg | Freq: Once | INTRAVENOUS | Status: AC
  Administered 2016-08-21: 600 mg via INTRAVENOUS
  Filled 2016-08-21: qty 60

## 2016-08-21 MED ORDER — PALONOSETRON HCL INJECTION 0.25 MG/5ML
0.2500 mg | Freq: Once | INTRAVENOUS | Status: AC
  Administered 2016-08-21: 0.25 mg via INTRAVENOUS

## 2016-08-21 MED ORDER — FAMOTIDINE IN NACL 20-0.9 MG/50ML-% IV SOLN
INTRAVENOUS | Status: AC
  Filled 2016-08-21: qty 50

## 2016-08-21 MED ORDER — DIPHENHYDRAMINE HCL 50 MG/ML IJ SOLN
50.0000 mg | Freq: Once | INTRAMUSCULAR | Status: AC
  Administered 2016-08-21: 50 mg via INTRAVENOUS

## 2016-08-21 NOTE — Progress Notes (Signed)
Oncology Nurse Navigator Documentation  Oncology Nurse Navigator Flowsheets 08/21/2016  Navigator Location [facility]  Referral date to RadOnc/MedOnc -  Navigator Encounter Type Treatment  Abnormal Finding Date -  Confirmed Diagnosis Date -  Treatment Initiated Date -  Patient Visit Type MedOnc  Treatment Phase Active Tx--Taxol/Carbo every 3 weeks  Barriers/Navigation Needs No barriers at this time;No Questions;No Needs  Education -  Interventions None required  Referrals -  Coordination of Care -  Education Method -  Support Groups/Services Confirmed with patient that he does want to be DNR per office note on 08/15/16 and he has the Out of Facility Order in home. Also reports that the [facility] paperwork was picked up.   Acuity Level 1  Time Spent with Patient 15

## 2016-08-21 NOTE — Patient Instructions (Signed)
[place] Discharge Instructions for Patients Receiving Chemotherapy  Today you received the following chemotherapy agents TAxol and Carboplatin. To help prevent nausea and vomiting after your treatment, we encourage you to take your nausea medication as directed. NO ZOFRAN FOR 3 DAYS.  If you develop nausea and vomiting that is not controlled by your nausea medication, call the clinic.   BELOW ARE SYMPTOMS THAT SHOULD BE REPORTED IMMEDIATELY:  *FEVER GREATER THAN 100.5 F  *CHILLS WITH OR WITHOUT FEVER  NAUSEA AND VOMITING THAT IS NOT CONTROLLED WITH YOUR NAUSEA MEDICATION  *UNUSUAL SHORTNESS OF BREATH  *UNUSUAL BRUISING OR BLEEDING  TENDERNESS IN MOUTH AND THROAT WITH OR WITHOUT PRESENCE OF ULCERS  *URINARY PROBLEMS  *BOWEL PROBLEMS  UNUSUAL RASH Items with * indicate a potential emergency and should be followed up as soon as possible.  Feel free to call the clinic you have any questions or concerns. The clinic phone number is [phone].  Please show the [facility] at check-in to the Emergency Department and triage nurse.

## 2016-08-27 ENCOUNTER — Other Ambulatory Visit: Payer: Self-pay | Admitting: Internal Medicine

## 2016-08-28 ENCOUNTER — Other Ambulatory Visit (HOSPITAL_COMMUNITY): Payer: Self-pay

## 2016-08-28 ENCOUNTER — Other Ambulatory Visit (HOSPITAL_BASED_OUTPATIENT_CLINIC_OR_DEPARTMENT_OTHER): Payer: Medicare Other

## 2016-08-28 ENCOUNTER — Inpatient Hospital Stay (HOSPITAL_COMMUNITY)
Admission: EM | Admit: 2016-08-28 | Discharge: 2016-08-31 | DRG: 871 | Disposition: A | Discharge status: Home Health | Payer: Medicare Other | Attending: Family Medicine | Admitting: Family Medicine

## 2016-08-28 ENCOUNTER — Other Ambulatory Visit: Payer: Self-pay | Admitting: Family Medicine

## 2016-08-28 ENCOUNTER — Other Ambulatory Visit: Payer: Self-pay

## 2016-08-28 ENCOUNTER — Ambulatory Visit (HOSPITAL_BASED_OUTPATIENT_CLINIC_OR_DEPARTMENT_OTHER): Payer: Medicare Other | Admitting: Nurse Practitioner

## 2016-08-28 ENCOUNTER — Emergency Department (HOSPITAL_COMMUNITY): Payer: Medicare Other

## 2016-08-28 VITALS — BP 71/42 | HR 97 | Resp 17 | Ht 69.0 in

## 2016-08-28 DIAGNOSIS — Z811 Family history of alcohol abuse and dependence: Secondary | ICD-10-CM

## 2016-08-28 DIAGNOSIS — I2510 Atherosclerotic heart disease of native coronary artery without angina pectoris: Secondary | ICD-10-CM | POA: Diagnosis not present

## 2016-08-28 DIAGNOSIS — C160 Malignant neoplasm of cardia: Secondary | ICD-10-CM

## 2016-08-28 DIAGNOSIS — I248 Other forms of acute ischemic heart disease: Secondary | ICD-10-CM | POA: Diagnosis not present

## 2016-08-28 DIAGNOSIS — R579 Shock, unspecified: Secondary | ICD-10-CM

## 2016-08-28 DIAGNOSIS — H5340 Unspecified visual field defects: Secondary | ICD-10-CM | POA: Diagnosis present

## 2016-08-28 DIAGNOSIS — I130 Hypertensive heart and chronic kidney disease with heart failure and stage 1 through stage 4 chronic kidney disease, or unspecified chronic kidney disease: Secondary | ICD-10-CM | POA: Diagnosis present

## 2016-08-28 DIAGNOSIS — D709 Neutropenia, unspecified: Secondary | ICD-10-CM | POA: Diagnosis present

## 2016-08-28 DIAGNOSIS — Z794 Long term (current) use of insulin: Secondary | ICD-10-CM

## 2016-08-28 DIAGNOSIS — E875 Hyperkalemia: Secondary | ICD-10-CM | POA: Diagnosis not present

## 2016-08-28 DIAGNOSIS — R680 Hypothermia, not associated with low environmental temperature: Secondary | ICD-10-CM | POA: Diagnosis present

## 2016-08-28 DIAGNOSIS — J449 Chronic obstructive pulmonary disease, unspecified: Secondary | ICD-10-CM | POA: Diagnosis present

## 2016-08-28 DIAGNOSIS — E876 Hypokalemia: Secondary | ICD-10-CM

## 2016-08-28 DIAGNOSIS — N183 Chronic kidney disease, stage 3 (moderate): Secondary | ICD-10-CM | POA: Diagnosis present

## 2016-08-28 DIAGNOSIS — Z6841 Body Mass Index (BMI) 40.0 and over, adult: Secondary | ICD-10-CM

## 2016-08-28 DIAGNOSIS — Z9981 Dependence on supplemental oxygen: Secondary | ICD-10-CM

## 2016-08-28 DIAGNOSIS — Z833 Family history of diabetes mellitus: Secondary | ICD-10-CM

## 2016-08-28 DIAGNOSIS — E871 Hypo-osmolality and hyponatremia: Secondary | ICD-10-CM | POA: Diagnosis present

## 2016-08-28 DIAGNOSIS — R6521 Severe sepsis with septic shock: Secondary | ICD-10-CM | POA: Diagnosis not present

## 2016-08-28 DIAGNOSIS — I252 Old myocardial infarction: Secondary | ICD-10-CM

## 2016-08-28 DIAGNOSIS — A419 Sepsis, unspecified organism: Principal | ICD-10-CM | POA: Diagnosis present

## 2016-08-28 DIAGNOSIS — N179 Acute kidney failure, unspecified: Secondary | ICD-10-CM | POA: Diagnosis present

## 2016-08-28 DIAGNOSIS — M1990 Unspecified osteoarthritis, unspecified site: Secondary | ICD-10-CM | POA: Diagnosis present

## 2016-08-28 DIAGNOSIS — Z9841 Cataract extraction status, right eye: Secondary | ICD-10-CM

## 2016-08-28 DIAGNOSIS — J9621 Acute and chronic respiratory failure with hypoxia: Secondary | ICD-10-CM | POA: Diagnosis present

## 2016-08-28 DIAGNOSIS — R739 Hyperglycemia, unspecified: Secondary | ICD-10-CM | POA: Diagnosis not present

## 2016-08-28 DIAGNOSIS — Z8249 Family history of ischemic heart disease and other diseases of the circulatory system: Secondary | ICD-10-CM

## 2016-08-28 DIAGNOSIS — E440 Moderate protein-calorie malnutrition: Secondary | ICD-10-CM | POA: Diagnosis not present

## 2016-08-28 DIAGNOSIS — R578 Other shock: Secondary | ICD-10-CM | POA: Diagnosis not present

## 2016-08-28 DIAGNOSIS — R4182 Altered mental status, unspecified: Secondary | ICD-10-CM | POA: Diagnosis not present

## 2016-08-28 DIAGNOSIS — E11649 Type 2 diabetes mellitus with hypoglycemia without coma: Secondary | ICD-10-CM | POA: Diagnosis not present

## 2016-08-28 DIAGNOSIS — I5032 Chronic diastolic (congestive) heart failure: Secondary | ICD-10-CM | POA: Diagnosis present

## 2016-08-28 DIAGNOSIS — I959 Hypotension, unspecified: Secondary | ICD-10-CM

## 2016-08-28 DIAGNOSIS — T451X5A Adverse effect of antineoplastic and immunosuppressive drugs, initial encounter: Secondary | ICD-10-CM | POA: Diagnosis not present

## 2016-08-28 DIAGNOSIS — D696 Thrombocytopenia, unspecified: Secondary | ICD-10-CM | POA: Diagnosis not present

## 2016-08-28 DIAGNOSIS — E861 Hypovolemia: Secondary | ICD-10-CM | POA: Diagnosis present

## 2016-08-28 DIAGNOSIS — R5383 Other fatigue: Secondary | ICD-10-CM | POA: Diagnosis not present

## 2016-08-28 DIAGNOSIS — E785 Hyperlipidemia, unspecified: Secondary | ICD-10-CM | POA: Diagnosis present

## 2016-08-28 DIAGNOSIS — R4781 Slurred speech: Secondary | ICD-10-CM | POA: Insufficient documentation

## 2016-08-28 DIAGNOSIS — G9340 Encephalopathy, unspecified: Secondary | ICD-10-CM

## 2016-08-28 DIAGNOSIS — E1165 Type 2 diabetes mellitus with hyperglycemia: Secondary | ICD-10-CM | POA: Diagnosis present

## 2016-08-28 DIAGNOSIS — E662 Morbid (severe) obesity with alveolar hypoventilation: Secondary | ICD-10-CM | POA: Diagnosis present

## 2016-08-28 DIAGNOSIS — E119 Type 2 diabetes mellitus without complications: Secondary | ICD-10-CM

## 2016-08-28 DIAGNOSIS — E860 Dehydration: Secondary | ICD-10-CM | POA: Insufficient documentation

## 2016-08-28 DIAGNOSIS — N19 Unspecified kidney failure: Secondary | ICD-10-CM

## 2016-08-28 DIAGNOSIS — R569 Unspecified convulsions: Secondary | ICD-10-CM | POA: Diagnosis not present

## 2016-08-28 DIAGNOSIS — Z961 Presence of intraocular lens: Secondary | ICD-10-CM | POA: Diagnosis present

## 2016-08-28 DIAGNOSIS — D61810 Antineoplastic chemotherapy induced pancytopenia: Secondary | ICD-10-CM | POA: Diagnosis not present

## 2016-08-28 DIAGNOSIS — R6520 Severe sepsis without septic shock: Secondary | ICD-10-CM

## 2016-08-28 DIAGNOSIS — Z7982 Long term (current) use of aspirin: Secondary | ICD-10-CM

## 2016-08-28 DIAGNOSIS — E162 Hypoglycemia, unspecified: Secondary | ICD-10-CM

## 2016-08-28 DIAGNOSIS — Z66 Do not resuscitate: Secondary | ICD-10-CM | POA: Diagnosis present

## 2016-08-28 DIAGNOSIS — Z9842 Cataract extraction status, left eye: Secondary | ICD-10-CM

## 2016-08-28 LAB — CBC WITH DIFFERENTIAL/PLATELET
BASO%: 0.4 % (ref 0.0–2.0)
BASOS ABS: 0 10*3/uL (ref 0.0–0.1)
EOS ABS: 0.4 10*3/uL (ref 0.0–0.5)
EOS%: 16.2 % — ABNORMAL HIGH (ref 0.0–7.0)
HCT: 27.8 % — ABNORMAL LOW (ref 38.4–49.9)
HEMOGLOBIN: 9 g/dL — AB (ref 13.0–17.1)
LYMPH%: 25.8 % (ref 14.0–49.0)
MCH: 27.5 pg (ref 27.2–33.4)
MCHC: 32.4 g/dL (ref 32.0–36.0)
MCV: 85 fL (ref 79.3–98.0)
MONO#: 0.1 10*3/uL (ref 0.1–0.9)
MONO%: 4.8 % (ref 0.0–14.0)
NEUT%: 52.8 % (ref 39.0–75.0)
NEUTROS ABS: 1.2 10*3/uL — AB (ref 1.5–6.5)
PLATELETS: 131 10*3/uL — AB (ref 140–400)
RBC: 3.27 10*6/uL — ABNORMAL LOW (ref 4.20–5.82)
RDW: 18.7 % — AB (ref 11.0–14.6)
WBC: 2.3 10*3/uL — AB (ref 4.0–10.3)
lymph#: 0.6 10*3/uL — ABNORMAL LOW (ref 0.9–3.3)

## 2016-08-28 LAB — URINALYSIS, ROUTINE W REFLEX MICROSCOPIC
Bilirubin Urine: NEGATIVE
GLUCOSE, UA: NEGATIVE mg/dL
KETONES UR: NEGATIVE mg/dL
LEUKOCYTES UA: NEGATIVE
Nitrite: NEGATIVE
PH: 5.5 (ref 5.0–8.0)
Protein, ur: NEGATIVE mg/dL
Specific Gravity, Urine: 1.01 (ref 1.005–1.030)

## 2016-08-28 LAB — CBG MONITORING, ED
GLUCOSE-CAPILLARY: 160 mg/dL — AB (ref 65–99)
GLUCOSE-CAPILLARY: 24 mg/dL — AB (ref 65–99)
GLUCOSE-CAPILLARY: 98 mg/dL (ref 65–99)
Glucose-Capillary: 29 mg/dL — CL (ref 65–99)
Glucose-Capillary: 77 mg/dL (ref 65–99)
Glucose-Capillary: 81 mg/dL (ref 65–99)

## 2016-08-28 LAB — TROPONIN I: TROPONIN I: 0.08 ng/mL — AB (ref <0.03)

## 2016-08-28 LAB — I-STAT TROPONIN, ED: Troponin i, poc: 0.11 ng/mL (ref 0.00–0.08)

## 2016-08-28 LAB — COMPREHENSIVE METABOLIC PANEL
ALT: 30 U/L (ref 0–55)
ANION GAP: 13 meq/L — AB (ref 3–11)
AST: 33 U/L (ref 5–34)
Albumin: 2.7 g/dL — ABNORMAL LOW (ref 3.5–5.0)
Alkaline Phosphatase: 68 U/L (ref 40–150)
BILIRUBIN TOTAL: 0.59 mg/dL (ref 0.20–1.20)
BUN: 39.2 mg/dL — ABNORMAL HIGH (ref 7.0–26.0)
CO2: 25 mEq/L (ref 22–29)
Calcium: 8.1 mg/dL — ABNORMAL LOW (ref 8.4–10.4)
Chloride: 95 mEq/L — ABNORMAL LOW (ref 98–109)
Creatinine: 2.6 mg/dL — ABNORMAL HIGH (ref 0.7–1.3)
EGFR: 24 mL/min/{1.73_m2} — AB (ref >90)
Glucose: 36 mg/dl — CL (ref 70–140)
Potassium: 3.2 mEq/L — ABNORMAL LOW (ref 3.5–5.1)
Sodium: 134 mEq/L — ABNORMAL LOW (ref 136–145)
TOTAL PROTEIN: 6.4 g/dL (ref 6.4–8.3)

## 2016-08-28 LAB — LIPASE, BLOOD: LIPASE: 52 U/L — AB (ref 11–51)

## 2016-08-28 LAB — PROTIME-INR
INR: 0.95
Prothrombin Time: 12.7 seconds (ref 11.4–15.2)

## 2016-08-28 LAB — GLUCOSE, CAPILLARY
Glucose-Capillary: 79 mg/dL (ref 65–99)
Glucose-Capillary: 80 mg/dL (ref 65–99)

## 2016-08-28 LAB — AMYLASE: Amylase: 58 U/L (ref 28–100)

## 2016-08-28 LAB — URINE MICROSCOPIC-ADD ON: BACTERIA UA: NONE SEEN

## 2016-08-28 LAB — TSH: TSH: 2.94 u[IU]/mL (ref 0.350–4.500)

## 2016-08-28 LAB — TYPE AND SCREEN
ABO/RH(D): O POS
ANTIBODY SCREEN: NEGATIVE

## 2016-08-28 LAB — LACTIC ACID, PLASMA: Lactic Acid, Venous: 2.4 mmol/L (ref 0.5–1.9)

## 2016-08-28 MED ORDER — SODIUM CHLORIDE 0.9 % IV BOLUS (SEPSIS): 1000.0000 mL | Freq: Once | INTRAVENOUS | Status: DC

## 2016-08-28 MED ORDER — ENOXAPARIN SODIUM 40 MG/0.4ML ~~LOC~~ SOLN
40.0000 mg | Freq: Every day | SUBCUTANEOUS | Status: DC
  Administered 2016-08-28 – 2016-08-30 (×3): 40 mg via SUBCUTANEOUS
  Filled 2016-08-28 (×3): qty 0.4

## 2016-08-28 MED ORDER — HYDROCORTISONE NA SUCCINATE PF 100 MG IJ SOLR
50.0000 mg | Freq: Four times a day (QID) | INTRAMUSCULAR | Status: DC
  Administered 2016-08-29: 50 mg via INTRAVENOUS
  Filled 2016-08-28: qty 2

## 2016-08-28 MED ORDER — ASPIRIN 81 MG PO CHEW
81.0000 mg | CHEWABLE_TABLET | Freq: Every day | ORAL | Status: DC
  Administered 2016-08-29 – 2016-08-31 (×3): 81 mg via ORAL
  Filled 2016-08-28 (×3): qty 1

## 2016-08-28 MED ORDER — DEXTROSE 50 % IV SOLN
50.0000 mL | Freq: Once | INTRAVENOUS | Status: AC
  Administered 2016-08-28: 50 mL via INTRAVENOUS
  Filled 2016-08-28: qty 50

## 2016-08-28 MED ORDER — INSULIN ASPART 100 UNIT/ML ~~LOC~~ SOLN: 2.0000 [IU] | SUBCUTANEOUS | Status: DC

## 2016-08-28 MED ORDER — NOREPINEPHRINE BITARTRATE 1 MG/ML IV SOLN
2.0000 ug/min | INTRAVENOUS | Status: DC
  Administered 2016-08-28: 4 ug/min via INTRAVENOUS
  Filled 2016-08-28: qty 4

## 2016-08-28 MED ORDER — SODIUM CHLORIDE 0.9 % IV BOLUS (SEPSIS)
3750.0000 mL | Freq: Once | INTRAVENOUS | Status: AC
  Administered 2016-08-28: 3750 mL via INTRAVENOUS

## 2016-08-28 MED ORDER — VANCOMYCIN HCL 10 G IV SOLR
1500.0000 mg | INTRAVENOUS | Status: DC
  Administered 2016-08-29: 1500 mg via INTRAVENOUS
  Filled 2016-08-28 (×2): qty 1500

## 2016-08-28 MED ORDER — HYDROCORTISONE NA SUCCINATE PF 100 MG IJ SOLR
100.0000 mg | Freq: Once | INTRAMUSCULAR | Status: AC
  Administered 2016-08-28: 100 mg via INTRAVENOUS
  Filled 2016-08-28: qty 2

## 2016-08-28 MED ORDER — ATORVASTATIN CALCIUM 80 MG PO TABS: 80.0000 mg | ORAL_TABLET | Freq: Every day | ORAL | Status: DC

## 2016-08-28 MED ORDER — SODIUM CHLORIDE 0.9 % IV SOLN
2000.0000 mg | Freq: Once | INTRAVENOUS | Status: AC
  Administered 2016-08-28: 2000 mg via INTRAVENOUS
  Filled 2016-08-28: qty 2000

## 2016-08-28 MED ORDER — PIPERACILLIN-TAZOBACTAM 3.375 G IVPB
3.3750 g | Freq: Three times a day (TID) | INTRAVENOUS | Status: DC
  Administered 2016-08-28 – 2016-08-31 (×8): 3.375 g via INTRAVENOUS
  Filled 2016-08-28 (×8): qty 50

## 2016-08-28 MED ORDER — ATORVASTATIN CALCIUM 40 MG PO TABS
80.0000 mg | ORAL_TABLET | Freq: Every day | ORAL | Status: DC
  Administered 2016-08-29 – 2016-08-30 (×2): 80 mg via ORAL
  Filled 2016-08-28 (×2): qty 2

## 2016-08-28 MED ORDER — SODIUM CHLORIDE 0.9 % IV SOLN: 250.0000 mL | INTRAVENOUS | Status: DC | PRN

## 2016-08-28 MED ORDER — PIPERACILLIN-TAZOBACTAM 3.375 G IVPB 30 MIN
3.3750 g | Freq: Once | INTRAVENOUS | Status: AC
  Administered 2016-08-28: 3.375 g via INTRAVENOUS
  Filled 2016-08-28: qty 50

## 2016-08-28 MED ORDER — ONDANSETRON HCL 4 MG/2ML IJ SOLN
4.0000 mg | Freq: Four times a day (QID) | INTRAMUSCULAR | Status: DC | PRN
Start: 2016-08-28 — End: 2016-08-31

## 2016-08-28 MED ORDER — DEXTROSE-NACL 5-0.9 % IV SOLN
INTRAVENOUS | Status: DC
  Administered 2016-08-28 – 2016-08-29 (×2): via INTRAVENOUS

## 2016-08-28 NOTE — ED Notes (Signed)
Bed: RESA Expected date:  Expected time:  Means of arrival:  Comments: [facility] Pt

## 2016-08-28 NOTE — ED Notes (Signed)
EMTALA filled out for wrong patient. Disregard.

## 2016-08-28 NOTE — ED Notes (Signed)
Gave report to Ben, RN

## 2016-08-28 NOTE — H&P (Signed)
PULMONARY / CRITICAL CARE MEDICINE   Name: [name] MRN: [id number] DOB: 1946/04/04    ADMISSION DATE:  08/28/2016 CONSULTATION DATE:  08/28/16  REFERRING MD:  Tyrone Nine - EDP  CHIEF COMPLAINT:  Weakness  HISTORY OF PRESENT ILLNESS:   [name] is a 70 y.o. 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 Burr Medico (just had first dose of second cycle 10/24).He presented to [facility] ED on 08/28/16 with lethargy, hypotension, hypoglycemia. He was apparently at his home and was unable to get up. He attempted to stand but ended up collapsing onto the floor. He had follow-up at the [facility] today so went to that appointment, and while there he was sent to the ED for further evaluation.   In ED, he remained hypotensive, hypoglycemic, hypothermic. He was given 30 ml/kg of IV fluids but BP unfortunately did not respond; therefore he was started on norepinephrine. He was also given stress dose steroids and PCCM was called for admission. Of notelso having some slurred speech in ED. Per family his BP has been low recently with SBP in low 90s. All of his home BP meds have recently been stopped due to this.    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); Coronary artery disease; Dermatitis (05/2014); DIABETES MELLITUS, TYPE II (06/24/2007); Esophageal cancer ([place]) (04/2016); Essential hypertension (05/01/2007); History of kidney stones; Hyperkalemia (12/2015); HYPERLIPIDEMIA (06/24/2007); HYPERTENSION (05/01/2007); Hypoxemia (01/27/2010); Myocardial infarction; OBESITY (09/02/2008); Obesity hypoventilation syndrome ([place]); On home  oxygen therapy; OSA (obstructive sleep apnea); OSTEOARTHRITIS (05/01/2007); Presbycusis of both ears (05/2015); Pruritic condition (05/2014); and Visual field defect (05/10/16).  PAST SURGICAL HISTORY: He  has a past surgical history that includes Appendectomy; Wrist surgery (Right); Shoulder surgery (Right); Lumbar disc surgery (2001); Carpal tunnel release (Left); Cataract extraction w/PHACO (Right, 04/29/2013); Colonoscopy w/ polypectomy (08/2011); left heart catheterization with coronary angiogram (N/A, 10/26/2013); Tonsillectomy; Cardiac catheterization; Knee arthroscopy with medial menisectomy (Right, 12/16/2014); transthoracic echocardiogram (03/2016); Cataract extraction, bilateral; Esophagogastroduodenoscopy (N/A, 05/04/2016); and Balloon dilation (N/A, 05/04/2016).  No Known Allergies  No current facility-administered medications on file prior to encounter.    Current Outpatient Prescriptions on File Prior to Encounter  Medication Sig  . aspirin 81 MG tablet Take 81 mg by mouth daily.    Marland Kitchen atorvastatin (LIPITOR) 80 MG tablet Take 1 tablet (80 mg total) by mouth daily.  . beta carotene w/minerals (OCUVITE) tablet Take 1 tablet by mouth daily.  . cetirizine (ZYRTEC) 10 MG tablet Take 10 mg by mouth daily.  . citalopram (CELEXA) 20 MG tablet Take 1 tablet (20 mg total) by mouth daily.  . clonazePAM (KLONOPIN) 1 MG tablet TAKE ONE TABLET BY MOUTH TWICE DAILY AS NEEDED FOR ANXIETY  . cromolyn (OPTICROM) 4 % ophthalmic solution Place 1 drop into both eyes 4 (four) times daily.  Marland Kitchen ezetimibe (ZETIA) 10 MG tablet Take 1 tablet (10 mg total) by mouth daily.  Marland Kitchen gabapentin (NEURONTIN) 300 MG capsule TAKE ONE CAPSULE BY MOUTH THREE TIMES DAILY  . hydrOXYzine (ATARAX/VISTARIL) 25 MG tablet 1 tab at supper and 1 at bedtime (for insomnia)  . Insulin Lispro (HUMALOG KWIKPEN) 200 UNIT/ML SOPN Inject 10 Units into the skin 3 (  three) times daily before meals.  . Insulin NPH, Human,, Isophane, (HUMULIN N KWIKPEN) 100  UNIT/ML Kiwkpen 90 U SQ qAM and 30 U SQ qSupper  . isosorbide mononitrate (IMDUR) 60 MG 24 hr tablet Take 1 tablet (60 mg total) by mouth at bedtime.  . metFORMIN (GLUCOPHAGE) 1000 MG tablet TAKE ONE TABLET BY MOUTH TWICE DAILY WITH MEALS  . Omega-3 Fatty Acids ([employer] FISH OIL) 1000 MG CAPS Take 2 tablets by mouth 2 (two) times daily.  Marland Kitchen oxyCODONE (OXY IR/ROXICODONE) 5 MG immediate release tablet Take 1 tablet (5 mg total) by mouth every 6 (six) hours as needed for severe pain.  . pantoprazole (PROTONIX) 40 MG tablet TAKE ONE TABLET BY MOUTH TWICE DAILY  . torsemide (DEMADEX) 20 MG tablet Take 1 tablet (20 mg total) by mouth daily. Take extra tab as directed for weight gain.  Marland Kitchen glucose blood (ONE TOUCH ULTRA TEST) test strip TEST 3 TIMES A DAY AS DIRECTED  . ondansetron (ZOFRAN) 8 MG tablet Take 1 tablet (8 mg total) by mouth 2 (two) times daily as needed for refractory nausea / vomiting. Start on day 3 after chemo.  Glory Rosebush DELICA LANCETS [id number] MISC 1 each by Other route 3 (three) times daily. USE TO TEST 3 TIMES A DAY  . OXYGEN Inhale 2 L/min into the lungs continuous.  . prochlorperazine (COMPAZINE) 10 MG tablet Take 1 tablet (10 mg total) by mouth every 6 (six) hours as needed (Nausea or vomiting).  . traMADol (ULTRAM) 50 MG tablet Take 1 tablet (50 mg total) by mouth every 8 (eight) hours as needed. (Patient not taking: Reported on 08/28/2016)    FAMILY HISTORY:  His indicated that his mother is deceased. He indicated that his father is deceased. He indicated that the status of his maternal aunt is unknown. He indicated that the status of his neg hx is unknown.    SOCIAL HISTORY: He  reports that he quit smoking about 32 years ago. His smoking use included Cigarettes, Pipe, and Cigars. He has a 45.00 pack-year smoking history. He quit smokeless tobacco use about 3 months ago. His smokeless tobacco use included Chew. He reports that he does not drink alcohol or use drugs.  REVIEW OF SYSTEMS:    Bolds are positive  Constitutional: weight loss, gain, night sweats, Fevers, chills, fatigue .  HEENT: headaches, Sore throat, sneezing, nasal congestion, post nasal drip, Difficulty swallowing, Tooth/dental problems, visual complaints visual changes, ear ache CV:  chest pain, radiates: ,Orthopnea, PND, swelling in lower extremities, dizziness, palpitations, syncope.  GI  heartburn, indigestion, abdominal pain, nausea, vomiting, diarrhea, change in bowel habits, loss of appetite, bloody stools.  Resp: cough, productive: , hemoptysis, dyspnea, chest pain, pleuritic.  Skin: rash or itching or icterus GU: dysuria, change in color of urine, urgency or frequency. flank pain, hematuria  MS: joint pain or swelling. decreased range of motion  Psych: change in mood or affect. depression or anxiety.  Neuro: difficulty with speech, weakness, numbness, ataxia    SUBJECTIVE:    VITAL SIGNS: BP (!) 78/48   Pulse 98   Resp 15   SpO2 100%   HEMODYNAMICS:    VENTILATOR SETTINGS:    INTAKE / OUTPUT: I/O last 3 completed shifts: In: 61 [IV Piggyback:50] Out: -   PHYSICAL EXAMINATION: General:  Obese male sitting in bed in NAD Neuro:  Alert, oriented, non-focal HEENT:  [place]/AT, PERRL, no JVD Cardiovascular:  RRR, no MRG Lungs:  Clear Abdomen:  Soft, non-tender, non-distended Musculoskeletal:  No acute deformity or ROM limitation. ROM 5/5 Skin:  Grossly intact  LABS:  BMET  Recent Labs Lab 08/28/16 1450  NA 134*  K 3.2*  CO2 25  BUN 39.2*  CREATININE 2.6*  GLUCOSE 36*    Electrolytes  Recent Labs Lab 08/28/16 1450  CALCIUM 8.1*    CBC  Recent Labs Lab 08/28/16 1450  WBC 2.3*  HGB 9.0*  HCT 27.8*  PLT 131*    Coag's No results for input(s): APTT, INR in the last 168 hours.  Sepsis Markers No results for input(s): LATICACIDVEN, PROCALCITON, O2SATVEN in the last 168 hours.  ABG No results for input(s): PHART, PCO2ART, PO2ART in the last 168 hours.  Liver  Enzymes  Recent Labs Lab 08/28/16 1450  AST 33  ALT 30  ALKPHOS 68  BILITOT 0.59  ALBUMIN 2.7*    Cardiac Enzymes No results for input(s): TROPONINI, PROBNP in the last 168 hours.  Glucose  Recent Labs Lab 08/28/16 1605  GLUCAP 160*    Imaging Dg Chest 2 View  Result Date: 08/28/2016 CLINICAL DATA:  Lethargy EXAM: CHEST  2 VIEW COMPARISON:  03/15/2016, 05/21/2016 FINDINGS: Cardiac shadow is mildly enlarged but stable. No focal infiltrate or sizable effusion is seen. No acute bony abnormality is noted. IMPRESSION: No active cardiopulmonary disease. Electronically Signed   By: Inez Catalina M.D.   On: 08/28/2016 16:47    STUDIES:  CXR 10/31 > no acute process.  CULTURES: Blood 10/31 >  Sputum 10/31 >  ANTIBIOTICS: Vanco 10/31 >  Zosyn 10/31 >  SIGNIFICANT EVENTS: 10/31 >  admitted to [facility] ICU due to shock, Concern hypovolemic versus septic.  LINES/TUBES:   DISCUSSION: 70 year old male with history of recently diagnosed adenocarcinoma of the GE junction (July 2017), now admitted with shock, concern hypovolemic versus septic.  ASSESSMENT / PLAN:  CARDIOVASCULAR A:  Shock - concern hypovolemic versus septic. Mild troponin bump - suspect due to demand ischemia. Hx HTN, HLD, CAD, MI, dCHF (echo from June 2017 with EF 55-60%, grade 1DD). P:  Continue norepinephrine for goal MAP > 60. Assess cortisol then stress dose steroids. Continue IVF's. Trend troponin, lactate. Continue preadmission ASA, atorvastatin. Hold preadmission imdur, torsemide. Levophed weaning. May need CVL if can't come off.   INFECTIOUS A:   Shock - concern hypovolemic versus septic. Suspect hypovolemia.  P:   Empiric abx as above (vanc / zosyn). Follow cultures. PCT not helpful in immunocompromised host. Consider CT abdomen/pelvis if declines  HEMATOLOGIC / ONCOLOGIC A:   Hx adenocarcinoma of the GE junction (Dx July 2017) - currently undergoing chemotherapy under the care of Dr.  Burr Medico. Pancytopenia - likely due to chemo. VTE prophylaxis. P:  Day team to please notify oncology of admission. SCD's / Heparin. CBC in AM.  PULMONARY A: Acute on chronic hypoxic respiratory failure - reportedly on 2 L 24/7. Hx OHS/OSA (not on CPAP), COPD per report but PFT's in past did NOT show obstruction (PFT's from May 2015 with FEV1 2.20/72% pred, ratio 82/111% pred). P:   Continue supplemental O2 as needed to maintain SpO2 > 92%. Pulmonary hygiene. PRN BD's. CXR intermittently.  RENAL A:   Mild hyponatremia - likely due to decreased PO intake. Mild hyperkalemia - likely due to decreased PO intake. AKI. Pseudohypocalcemia - corrects to 9.14. P:   NS @ 125. Assess ionized calcium. Correct electrolytes as indicated. BMP in AM.  GASTROINTESTINAL A:   Nutrition. P:   Clear liquid. Diet, would typically choose NPO, but patient being rude  and loud demanding food. Compromised with clear liquid for now.   ENDOCRINE A:   Hypoglycemia - resolved. (did not eat much today and took insulin) DM 2.  P:   SSI. D5NS at 182ml/hr. Starting diet Hold preadmission humalog, humulin, metformin.  NEUROLOGIC A:   Slurred speech (CBG 29 treating now) P:   Hold preadmission cetirizine, citalopram, clonazepam, gabapentin, hydroxyzine, prochlorperazine.   FAMILY  - Updates: patient and family updated in ED by [facility]  - Inter-disciplinary family meet or Palliative Care meeting due by:  11/6  APP critical care time 45 mins.  Georgann Housekeeper, AGACNP-BC [place] [facility]ology/Critical Care Pager [phone] or [phone]  08/28/2016 8:25 PM  Attending Note:  70 year old with PMH of adenocarcinoma of the esophagus started second round of chemo.  Presenting with weakness.  Patient was noted to be hypotensive and hypoglycemic in the cancer center and was sent to the ED for evaluation.  Patient was found to have a presenting SBP of 60.  IVF resuscitation started and norepi started.  On  exam, patient appears very dry and seems to be slowly responding to IVF.  I reviewed CXR myself, cardiomegally but no other acute abnormalities noted.  Will continue vanc/zosyn.  F/U on cultures.  Monitor BP and tele in the ICU.  Hope is to get levophed off and be able to transition slowly to less aggressive abx pending culture.  Continue IVF resuscitation.  No head CT for now as seems to be improving as hypoglycemia improves.  Hold in ICU.  The patient is critically ill with multiple organ systems failure and requires high complexity decision making for assessment and support, frequent evaluation and titration of therapies, application of advanced monitoring technologies and extensive interpretation of multiple databases.   Critical Care Time devoted to patient care services described in this note is  45  Minutes. This time reflects time of care of this signee Dr Jennet Maduro. This critical care time does not reflect procedure time, or teaching time or supervisory time of PA/NP/Med student/Med Resident etc but could involve care discussion time.  Rush Farmer, M.D. [facility] Pulmonary/Critical Care Medicine. Pager: [phone]. After hours pager: [phone].

## 2016-08-28 NOTE — ED Notes (Signed)
Attempted to call report to ICU. RN will call me back.

## 2016-08-28 NOTE — Progress Notes (Signed)
[facility] Progress Note Patient Name: [name] DOB: 01/31/1946 MRN: [id number]   Date of Service  08/28/2016  HPI/Events of Note  Patient transferred from the ER to ICU.  Patient with  recent history of GI adenocarcinoma with hypotension likely hypovolemia and possible sepsis. Currently getting chemotherapy. Has been given fluids at the ER. Currently getting IVF. Levo fed was started at the emergency room and was just turned off.   Patient seen. Comfortable. Not in distress. No subjective complaints. Blood pressure 114/56, heart rate 96, respiratory rate 16, O2 saturation 100% on nasal cannula.   Family in the room. Updated as well.   eICU Interventions  Continue IV fluids.  Patient currently getting blood drawn.  Follow-up lactic acid, anticipate it will be lower.  Continue to observe off levo fed.  Continue clear liquid diet.  Family updated at bedside.      Intervention Category Evaluation Type: Other  [place] 08/28/2016, 11:49 PM

## 2016-08-28 NOTE — ED Provider Notes (Signed)
[facility] DEPT Provider Note   CSN: [id number] Arrival date & time: 08/28/16  1603     History   Chief Complaint Chief Complaint  Patient presents with  . Hypoglycemia  . Weakness    HPI [name] is a 70 y.o. male.  70 yo M with a chief complaint of weakness. Patient has been undergoing chemotherapy and just finished his first three-week cycle beginning of this week. Since he has been home he has been unable to get up out of his chair. There is having tries to stand he slowly collapsed to the floor. Had a follow-up appointment today and was noted to be hypotensive as well as have a blood sugar in the 30s. He went down to the ED for evaluation. He denies any pain denies fevers or chills denies vomiting or diarrhea.   The history is provided by the patient.  Hypoglycemia  Associated symptoms: weakness   Associated symptoms: no shortness of breath, no tremors and no vomiting   Weakness  Primary symptoms include no focal weakness (diffuse weakness). This is a new problem. The current episode started more than 2 days ago. The problem has not changed since onset.There was no focality noted. There has been no fever. Pertinent negatives include no shortness of breath, no chest pain, no vomiting, no confusion and no headaches. Associated medical issues include trauma (5 falls this week, feel his legs are unable to support his weight).    Past Medical History:  Diagnosis Date  . Asthma    as a child  . Cataract    multiple types, bilateral  . Chronic diastolic CHF (congestive heart failure) ([place]) 02/25/2009  . Chronic renal insufficiency, stage III (moderate) 2017   Stage II/III (GFR around 60)  . 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 COPD (chronic obstructive pulmonary disease) ([place])   . Coronary artery disease    chronically occluded LAD and diagonal with right to left collaterals, mild disease in the left  circ and moderate disease in the mid RCA on medical management with Imdur, ASA, and Plavix.  . Dermatitis 05/2014  . DIABETES MELLITUS, TYPE II 06/24/2007   No diab retpthy as of 08/05/15 eye exam.  . 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 Pyrtle--EGD).  CT C/A/P showed metastatic adenopathy in mediastinum and upper abdomen 05/09/16.  Tx plan is palliative radiation (completed 06/22/16), then palliative systemic chemotherapy (carbo+taxol) was started but as of 08/03/16 onc f/u this was held due to severe knee and ankle arthralgias.  Restarting as of 08/20/16.  . Essential hypertension 05/01/2007   Qualifier: Diagnosis of  By: Tiney Rouge CMA, Ellison Hughs     . History of kidney stones   . Hyperkalemia 12/2015   Decreased ACE-I by 50% in response, then potassium normalized.  Marland Kitchen HYPERLIPIDEMIA 06/24/2007  . HYPERTENSION 05/01/2007  . Hypoxemia 01/27/2010  . Myocardial infarction    pt states he was informed per MD that he has had one but pt was unaware   . OBESITY 09/02/2008  . Obesity hypoventilation syndrome (HCC)    oxygen 24/7 (2 liters [place] as of 02/2016)  . On home oxygen therapy    Oxygen @ 2l/m nasally 24/7 hours  . OSA (obstructive sleep apnea)    not tested; pt scored 4 per stop bang tool results sent to PCP   . OSTEOARTHRITIS 05/01/2007  . Presbycusis of both ears 05/2015  Foley ENT  . Pruritic condition 05/2014   Allergist summer 2015, no new testing.  [name] field defect 05/10/16   Per Dr. Melina Fiddler, O.D.: Rt, loss of inf/temp quad and some loss sup/temp.  ?CVA  ? Pituitary tumor? ?Brain met.    Patient Active Problem List   Diagnosis Date Noted  . Shock ([place]) 08/28/2016  . 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 Mass of esophagus determined by endoscopy   . O2 dependent 04/10/2016  . Coronary artery disease with unspecified angina pectoris 04/10/2016  . IDDM (insulin dependent diabetes mellitus) ([place])  04/10/2016  . Dysphagia 03/22/2016  . PND (paroxysmal nocturnal dyspnea)   . Dyslipidemia associated with type 2 diabetes mellitus ([place])   . DM (diabetes mellitus) type II controlled peripheral vascular disorder ([place])   . Spasm of muscle, back 06/20/2015  . Right knee pain 10/26/2014  . Scabies 09/10/2014  . Impaired functional mobility and activity tolerance 08/26/2014  . Recurrent falls 08/26/2014  . Other emphysema ([place]) 08/26/2014  . COPD (chronic obstructive pulmonary disease) ([place])   . Rash and nonspecific skin eruption 06/27/2014  . Pruritic condition 05/18/2014  . Adjustment disorder with anxious mood 04/25/2014  . Itchy skin 04/25/2014  . OSA (obstructive sleep apnea) 02/24/2014  . Coronary atherosclerosis of native coronary artery 11/10/2013  . Abnormal cardiovascular stress test 10/16/2013  . DOE (dyspnea on exertion) 09/14/2013  . HYPOXEMIA 01/27/2010  . Chronic diastolic CHF (congestive heart failure) ([place]) 02/25/2009  . Morbid obesity ([place]) 09/02/2008  . Diabetes mellitus without complication ([place]) 15/02/6978  . Dyslipidemia 06/24/2007  . Essential hypertension 05/01/2007  . OSTEOARTHRITIS 05/01/2007    Past Surgical History:  Procedure Laterality Date  . APPENDECTOMY    . BALLOON DILATION N/A 05/04/2016   Procedure: BALLOON DILATION;  Surgeon: Jerene Bears, MD;  Location: [facility];  Service: Gastroenterology;  Laterality: N/A;  . CARDIAC CATHETERIZATION    . CARPAL TUNNEL RELEASE Left   . CATARACT EXTRACTION W/PHACO Right 04/29/2013   Procedure: CATARACT EXTRACTION PHACO AND INTRAOCULAR LENS PLACEMENT (IOC);  Surgeon: Adonis Brook, MD;  Location: [facility];  Service: Ophthalmology;  Laterality: Right;  . CATARACT EXTRACTION, BILATERAL    . COLONOSCOPY W/ POLYPECTOMY  08/2011   Many polyps--all hyperplastic, severe diverticulosis, int hem.  [employer] hemoccult testing via [facility] 06/22/15 NEG  . ESOPHAGOGASTRODUODENOSCOPY N/A 05/04/2016   Procedure:  ESOPHAGOGASTRODUODENOSCOPY (EGD);  Surgeon: Jerene Bears, MD;  Location: [facility];  Service: Gastroenterology;  Laterality: N/A;  . KNEE ARTHROSCOPY WITH MEDIAL MENISECTOMY Right 12/16/2014   Procedure: RIGHT KNEE ARTHROSCOPY WITH MEDIAL MENISECTOMY microfracture medial femoral condyle abrasion condroplasty medial femoral condyle lateral menisectomy;  Surgeon: Tobi Bastos, MD;  Location: [facility];  Service: Orthopedics;  Laterality: Right;  . LEFT HEART CATHETERIZATION WITH CORONARY ANGIOGRAM N/A 10/26/2013   Procedure: LEFT HEART CATHETERIZATION WITH CORONARY ANGIOGRAM;  Surgeon: Jettie Booze, MD;  Location[facility] C[facility] LAB;  Service: Cardiovascular;  Laterality: N/A;  . LUMBAR [place]arty SURGERY  2001  . SHOULDER SURGERY Right    right fx  . TONSILLECTOMY    . TRANSTHORACIC ECHOCARDIOGRAM  03/2016   EF 55-60%, grade I DD, LAE  . WRIST SURGERY Right    right fx       Home Medications    Prior to Admission medications   Medication Sig Start Date End Date Taking? Authorizing Provider  aspirin 81 MG tablet Take 81 mg by mouth daily.  Yes Historical Provider, MD  atorvastatin (LIPITOR) 80 MG tablet Take 1 tablet (80 mg total) by mouth daily. 01/31/16  Yes Sueanne Margarita, MD  beta carotene w/minerals (OCUVITE) tablet Take 1 tablet by mouth daily.   Yes Historical Provider, MD  cetirizine (ZYRTEC) 10 MG tablet Take 10 mg by mouth daily.   Yes Historical Provider, MD  citalopram (CELEXA) 20 MG tablet Take 1 tablet (20 mg total) by mouth daily. 01/04/16  Yes Tammi Sou, MD  clonazePAM (KLONOPIN) 1 MG tablet TAKE ONE TABLET BY MOUTH TWICE DAILY AS NEEDED FOR ANXIETY 07/04/16  Yes Tammi Sou, MD  cromolyn (OPTICROM) 4 % ophthalmic solution Place 1 drop into both eyes 4 (four) times daily. 02/09/16  Yes Historical Provider, MD  ezetimibe (ZETIA) 10 MG tablet Take 1 tablet (10 mg total) by mouth daily. 10/17/15  Yes Sueanne Margarita, MD  gabapentin (NEURONTIN) 300 MG capsule TAKE ONE  CAPSULE BY MOUTH THREE TIMES DAILY 08/29/15  Yes Tammi Sou, MD  hydrOXYzine (ATARAX/VISTARIL) 25 MG tablet 1 tab at supper and 1 at bedtime (for insomnia) 11/09/15  Yes Tammi Sou, MD  Insulin Lispro (HUMALOG KWIKPEN) 200 UNIT/ML SOPN Inject 10 Units into the skin 3 (three) times daily before meals. 07/10/16  Yes Tammi Sou, MD  Insulin NPH, Human,, Isophane, (HUMULIN N KWIKPEN) 100 UNIT/ML Kiwkpen 90 U SQ qAM and 30 U SQ qSupper 07/10/16  Yes Tammi Sou, MD  isosorbide mononitrate (IMDUR) 60 MG 24 hr tablet Take 1 tablet (60 mg total) by mouth at bedtime. 03/16/16  Yes Cheryln Manly, NP  metFORMIN (GLUCOPHAGE) 1000 MG tablet TAKE ONE TABLET BY MOUTH TWICE DAILY WITH MEALS 08/28/16  Yes Tammi Sou, MD  Omega-3 Fatty Acids ([employer] FISH OIL) 1000 MG CAPS Take 2 tablets by mouth 2 (two) times daily. 10/18/14  Yes Sueanne Margarita, MD  oxyCODONE (OXY IR/ROXICODONE) 5 MG immediate release tablet Take 1 tablet (5 mg total) by mouth every 6 (six) hours as needed for severe pain. 08/03/16  Yes Truitt Merle, MD  pantoprazole (PROTONIX) 40 MG tablet TAKE ONE TABLET BY MOUTH TWICE DAILY 08/27/16  Yes Jerene Bears, MD  torsemide (DEMADEX) 20 MG tablet Take 1 tablet (20 mg total) by mouth daily. Take extra tab as directed for weight gain. 03/16/16  Yes Cheryln Manly, NP  glucose blood (ONE TOUCH ULTRA TEST) test strip TEST 3 TIMES A DAY AS DIRECTED 02/23/16   Tammi Sou, MD  ondansetron (ZOFRAN) 8 MG tablet Take 1 tablet (8 mg total) by mouth 2 (two) times daily as needed for refractory nausea / vomiting. Start on day 3 after chemo. 07/06/16   Truitt Merle, MD  [facility] DELICA LANCETS 81W MISC 1 each by Other route 3 (three) times daily. USE TO TEST 3 TIMES A DAY 02/23/16   Tammi Sou, MD  OXYGEN Inhale 2 L/min into the lungs continuous.    Historical Provider, MD  prochlorperazine (COMPAZINE) 10 MG tablet Take 1 tablet (10 mg total) by mouth every 6 (six) hours as needed (Nausea or  vomiting). 07/06/16   Truitt Merle, MD  traMADol (ULTRAM) 50 MG tablet Take 1 tablet (50 mg total) by mouth every 8 (eight) hours as needed. Patient not taking: Reported on 08/28/2016 07/06/16   Truitt Merle, MD    Family History Family History  Problem Relation Age of Onset  . Heart attack Mother   . Aneurysm Father   .  Alcohol abuse Father   . Diabetes Maternal Aunt   . Colon cancer Neg Hx     Social History Social History  Substance Use Topics  . Smoking status: Former Smoker    Packs/day: 1.50    Years: 30.00    Types: Cigarettes, Pipe, Cigars    Quit date: 10/30/1983  . Smokeless tobacco: Former Systems developer    Types: Chew    Quit date: 05/15/2016  . Alcohol use No     Allergies   Review of patient's allergies indicates no known allergies.   Review of Systems Review of Systems  Constitutional: Negative for chills and fever.  HENT: Negative for congestion and facial swelling.   Eyes: Negative for discharge and visual disturbance.  Respiratory: Negative for shortness of breath.   Cardiovascular: Negative for chest pain and palpitations.  Gastrointestinal: Negative for abdominal pain, diarrhea and vomiting.  Musculoskeletal: Negative for arthralgias and myalgias.  Skin: Negative for color change and rash.  Neurological: Positive for weakness. Negative for tremors, focal weakness (diffuse weakness), syncope and headaches.  Psychiatric/Behavioral: Negative for confusion and dysphoric mood.     Physical Exam Updated Vital Signs BP 103/61   Pulse 92   Resp 17   SpO2 100%   Physical Exam  Constitutional: He is oriented to person, place, and time. He appears well-developed and well-nourished.  pale  HENT:  Head: Normocephalic and atraumatic.  Eyes: EOM are normal. Pupils are equal, round, and reactive to light.  Neck: Normal range of motion. Neck supple. No JVD present.  Cardiovascular: Normal rate and regular rhythm.  Exam reveals no gallop and no friction rub.   No murmur  heard. Pulmonary/Chest: No respiratory distress. He has no wheezes.  Abdominal: He exhibits no distension and no mass. There is no tenderness. There is no rebound and no guarding.  Musculoskeletal: Normal range of motion.  Neurological: He is alert and oriented to person, place, and time.  Skin: No rash noted. No pallor.  Psychiatric: He has a normal mood and affect. His behavior is normal.  Nursing note and vitals reviewed.    ED Treatments / Results  Labs (all labs ordered are listed, but only abnormal results are displayed) Labs Reviewed  CBG MONITORING, ED - Abnormal; Notable for the following:       Result Value   Glucose-Capillary 160 (*)    All other components within normal limits  I-STAT TROPOININ, ED - Abnormal; Notable for the following:    Troponin i, poc 0.11 (*)    All other components within normal limits  CBG MONITORING, ED - Abnormal; Notable for the following:    Glucose-Capillary 29 (*)    All other components within normal limits  CBG MONITORING, ED - Abnormal; Notable for the following:    Glucose-Capillary 24 (*)    All other components within normal limits  CULTURE, BLOOD (ROUTINE X 2)  CULTURE, BLOOD (ROUTINE X 2)  TSH  URINALYSIS, ROUTINE W REFLEX MICROSCOPIC (NOT AT [facility])  CORTISOL  AMYLASE  LIPASE, BLOOD  TROPONIN I  TROPONIN I  TROPONIN I  LACTIC ACID, PLASMA  LACTIC ACID, PLASMA  PROTIME-INR  CBC  BASIC METABOLIC PANEL  MAGNESIUM  PHOSPHORUS  I-STAT CG4 LACTIC ACID, ED  TYPE AND SCREEN    EKG  EKG Interpretation None       Radiology Dg Chest 2 View  Result Date: 08/28/2016 CLINICAL DATA:  Lethargy EXAM: CHEST  2 VIEW COMPARISON:  03/15/2016, 05/21/2016 FINDINGS: Cardiac shadow is mildly enlarged  but stable. No focal infiltrate or sizable effusion is seen. No acute bony abnormality is noted. IMPRESSION: No active cardiopulmonary disease. Electronically Signed   By: Inez Catalina M.D.   On: 08/28/2016 16:47     Procedures Procedures (including critical care time)  Medications Ordered in ED Medications  norepinephrine (LEVOPHED) 4 mg in dextrose 5 % 250 mL (0.016 mg/mL) infusion (2 mcg/min Intravenous Rate/Dose Change 08/28/16 2038)  dextrose 5 %-0.9 % sodium chloride infusion ( Intravenous New Bag/Given 08/28/16 2033)  0.9 %  sodium chloride infusion (not administered)  enoxaparin (LOVENOX) injection 40 mg (not administered)  ondansetron (ZOFRAN) injection 4 mg (not administered)  insulin aspart (novoLOG) injection 2-6 Units (not administered)  hydrocortisone sodium succinate (SOLU-CORTEF) 100 MG injection 50 mg (not administered)  aspirin tablet 81 mg (not administered)  atorvastatin (LIPITOR) tablet 80 mg (not administered)  sodium chloride 0.9 % bolus 3,750 mL (0 mLs Intravenous Stopped 08/28/16 2040)  vancomycin (VANCOCIN) 2,000 mg in sodium chloride 0.9 % 500 mL IVPB (0 mg Intravenous Stopped 08/28/16 1921)  piperacillin-tazobactam (ZOSYN) IVPB 3.375 g (0 g Intravenous Stopped 08/28/16 1709)  hydrocortisone sodium succinate (SOLU-CORTEF) 100 MG injection 100 mg (100 mg Intravenous Given 08/28/16 1941)  dextrose 50 % solution 50 mL (50 mLs Intravenous Given 08/28/16 2030)     Initial Impression / Assessment and Plan / ED Course  I have reviewed the triage vital signs and the nursing notes.  Pertinent labs & imaging results that were available during my care of the patient were reviewed by me and considered in my medical decision making (see chart for details).  Clinical Course    70 yo M With a chief complaint of weakness. He was found to be hypertensive and hyperglycemic in the office today. Blood pressure in the 70s on arrival to the ED. We are unable to obtain a temperature. Concern for possible underlying sepsis will give broad-spectrum antibiotics and 30 mL/kg of IV fluids. He did have an AKI based on labs as well as a 1 g hemoglobin drop over the past week.  Patient remains  persistently hypertensive. Will start on norepinephrine. Cortisol level, given hydrocortisone. Critical care consult  CRITICAL CARE Performed by: Cecilio Asper   Total critical care time: 80 minutes  Critical care time was exclusive of separately billable procedures and treating other patients.  Critical care was necessary to treat or prevent imminent or life-threatening deterioration.  Critical care was time spent personally by me on the following activities: development of treatment plan with patient and/or surrogate as well as nursing, discussions with consultants, evaluation of patient's response to treatment, examination of patient, obtaining history from patient or surrogate, ordering and performing treatments and interventions, ordering and review of laboratory studies, ordering and review of radiographic studies, pulse oximetry and re-evaluation of patient's condition.  The patients results and plan were reviewed and discussed.   Any x-rays performed were independently reviewed by myself.   Differential diagnosis were considered with the presenting HPI.  Medications  norepinephrine (LEVOPHED) 4 mg in dextrose 5 % 250 mL (0.016 mg/mL) infusion (2 mcg/min Intravenous Rate/Dose Change 08/28/16 2038)  dextrose 5 %-0.9 % sodium chloride infusion ( Intravenous New Bag/Given 08/28/16 2033)  0.9 %  sodium chloride infusion (not administered)  enoxaparin (LOVENOX) injection 40 mg (not administered)  ondansetron (ZOFRAN) injection 4 mg (not administered)  insulin aspart (novoLOG) injection 2-6 Units (not administered)  hydrocortisone sodium succinate (SOLU-CORTEF) 100 MG injection 50 mg (not administered)  aspirin tablet 81  mg (not administered)  atorvastatin (LIPITOR) tablet 80 mg (not administered)  sodium chloride 0.9 % bolus 3,750 mL (0 mLs Intravenous Stopped 08/28/16 2040)  vancomycin (VANCOCIN) 2,000 mg in sodium chloride 0.9 % 500 mL IVPB (0 mg Intravenous Stopped 08/28/16  1921)  piperacillin-tazobactam (ZOSYN) IVPB 3.375 g (0 g Intravenous Stopped 08/28/16 1709)  hydrocortisone sodium succinate (SOLU-CORTEF) 100 MG injection 100 mg (100 mg Intravenous Given 08/28/16 1941)  dextrose 50 % solution 50 mL (50 mLs Intravenous Given 08/28/16 2030)    Vitals:   08/28/16 1830 08/28/16 1900 08/28/16 1930 08/28/16 2000  BP: (!) 83/45 (!) 78/48 90/58 103/61  Pulse: 98  96 92  Resp: _0 SpO2: 100%  100% 100%    Final diagnoses:  Severe sepsis ([place])  Septic shock ([place])    Admission/ observation were discussed with the admitting physician, patient and/or family and they are comfortable with the plan.    Final Clinical Impressions(s) / ED Diagnoses   Final diagnoses:  Severe sepsis ([place])  Septic shock [facility])    New Prescriptions New Prescriptions   No medications on file     Deno Etienne, DO 08/28/16 2044

## 2016-08-28 NOTE — ED Notes (Signed)
[facility] being sent [facility].  C/o lethargy and hypotension starting this afternoon.  Pt was found to have a CBG 36.  Amp of D50 given.  Denies n/v/d and fever.  Hx of GE junction CA.  Last chemo 10/24.  [facility] reports new renal dysfunction.

## 2016-08-28 NOTE — Progress Notes (Signed)
Pharmacy Antibiotic Note  [name] is a 70 y.o. male admitted on 08/28/2016 with sepsis.  Pharmacy has been consulted for Vancomycin & Zosyn dosing.  08/28/2016:  Neutropenic due to ongoing chemotherapy  Acute renal failure (baseline Scr 1.0-1.2)  Hypotensive- on Norepinephrine gtt  Plan: Vancomycin 2gm IV x1 then 1500mg  IV q24h Zosyn 3.375gm IV Q8h to be infused over 4hrs Check Vancomycin trough at steady state Monitor renal function and cx data      No data recorded.   Recent Labs Lab 08/28/16 1450  WBC 2.3*  CREATININE 2.6*    Estimated Creatinine Clearance: 34.4 mL/min (by C-G formula based on SCr of 2.6 mg/dL (H)).    No Known Allergies  Antimicrobials this admission: 10/31 Vanc >>  10/31 Zosyn >>   Dose adjustments this admission:  Microbiology results: 10/31 BCx: sent  Thank you for allowing pharmacy to be a part of this patient's care.  Netta Cedars, PharmD, BCPS Pager: [phone] 08/28/2016 8:45 PM

## 2016-08-28 NOTE — Progress Notes (Addendum)
[facility] OFFICE PROGRESS NOTE   Diagnosis:  GE junction cancer Oncology History   Cancer of cardio-esophageal junction ([place])   Staging form: Stomach, AJCC 7th Edition     Clinical stage from 05/04/2016: Stage IV (TX, N2, M1) - Signed by Truitt Merle, MD on 05/18/2016       Cancer of cardio-esophageal junction ([place])   05/04/2016 Initial Diagnosis    Cancer of cardio-esophageal junction ([place])     05/04/2016 Procedure    EGD showed a large ulcerating mass with no active bleeding at the gastroesophageal junction extending into the gastric cardia. The mass was not obstructing and partially circumferential, extends approximately 5 cm. Nonbleeding erosive gastropathy.     05/04/2016 Initial Biopsy    Esophageal gastric junction biopsy showed a poorly differentiated carcinoma underlying the squamous mucosa. There is lymphovascular invasion. No Intestinal metaplasia. IHC weakly positive CK5/6, p63 (-), favor squamous.       05/09/2016 Imaging    CT CAP w contrast showed mild wall thickening involving the distal esophagus and proximal stomach compatible with known cancer, enlarged mediastinal and upper abdominal lymph nodes are highly suspicious for metastatic adenopathy, propable liver cirrhosis.     06/04/2016 - 06/22/2016 Radiation Therapy    palliative radiation to esophageal cancer      07/06/2016 -  Chemotherapy    Weekly carbo and taxol      INTERVAL HISTORY:   [name] returns as scheduled. He completed the first cycle of every three-week carboplatin/Taxol 08/21/2016. Upon arrival to the office he was noted to be extremely lethargic. He was found to be hypotensive, blood glucose 36, creatinine 2.6. His daughter reports he was in his normal state of health today, conversant and appropriate, on the car ride to the office. At which time his blood sugar was 69 and he took his usual dose of insulin. She reports he has been eating and drinking well. No  nausea, vomiting or diarrhea. No fever or shaking chills.  Objective:  Vital signs in last 24 hours:  Blood pressure (!) 71/42, pulse 97, resp. rate 17, height 5\' 9"  (1.753 m), SpO2 100 %.    HEENT: No thrush or ulcers. Resp: Lungs clear bilaterally. Cardio: Regular rate and rhythm. GI: Abdomen soft. Neuro: Lethargic. Follows some commands.    Lab Results:  Lab Results  Component Value Date   WBC 2.3 (L) 08/28/2016   HGB 9.0 (L) 08/28/2016   HCT 27.8 (L) 08/28/2016   MCV 85.0 08/28/2016   PLT 131 (L) 08/28/2016   NEUTROABS 1.2 (L) 08/28/2016   BUN 39.2, creatinine 2.6, potassium 3.2, sodium 134, glucose 36 Imaging:  No results found.  Medications: I have reviewed the patient's current medications.  Assessment/Plan: 1. Cancer of cardio-esophageal junction, poorly differentiated, cTxN2M1, stage IV diagnosed July 2017; he completed a course of radiation followed by weekly carboplatin/Taxol, subsequently discontinued due to worsening arthralgias; chemotherapy restarted with carboplatin/Taxol every 3 weeks on 08/21/2016. 2. CAD, diastolic CHF, on continuous oxygen followed by Dr. Radford Pax. 3. Diabetes mellitus 4. Hypertension 5. OSA, morbid obesity 6. Epigastric pain likely secondary to #1 7. Arthralgias, increased following chemotherapy   Disposition: [name] presented to the office today for routine follow-up. He was extremely lethargic upon arrival; subsequently found to have a low blood sugar, hypotension and renal failure. An IV was started and an amp of D50 administered. His mental status improved. He remained hypotensive. He was transported to the emergency department for evaluation.  Patient seen with Dr.  Derrick Ravel, [place] ANP/GNP-BC   08/28/2016  3:30 PM  Addendum I have seen the patient, examined him. I agree with the assessment and and plan and have edited the notes.   Severe hypoglycemia and hypotensive with encephalopathy, improved with D50, will  send him to emergency room for further workup, including infectious disease workup. Likely need to be admitted to hospital, and I'll follow him in the hospital.   I spent 30 mins with pt in the room for direct care.   Truitt Merle  08/28/2016

## 2016-08-28 NOTE — ED Notes (Signed)
Pt tried to give UA and was unsuccessful

## 2016-08-29 ENCOUNTER — Encounter (HOSPITAL_COMMUNITY): Payer: Self-pay

## 2016-08-29 ENCOUNTER — Encounter: Payer: Self-pay | Admitting: Nurse Practitioner

## 2016-08-29 DIAGNOSIS — R569 Unspecified convulsions: Secondary | ICD-10-CM

## 2016-08-29 DIAGNOSIS — E440 Moderate protein-calorie malnutrition: Secondary | ICD-10-CM

## 2016-08-29 LAB — GLUCOSE, CAPILLARY
GLUCOSE-CAPILLARY: 104 mg/dL — AB (ref 65–99)
GLUCOSE-CAPILLARY: 118 mg/dL — AB (ref 65–99)
GLUCOSE-CAPILLARY: 210 mg/dL — AB (ref 65–99)
Glucose-Capillary: 194 mg/dL — ABNORMAL HIGH (ref 65–99)
Glucose-Capillary: 239 mg/dL — ABNORMAL HIGH (ref 65–99)

## 2016-08-29 LAB — CBC
HCT: 27.8 % — ABNORMAL LOW (ref 39.0–52.0)
Hemoglobin: 9.1 g/dL — ABNORMAL LOW (ref 13.0–17.0)
MCH: 28.2 pg (ref 26.0–34.0)
MCHC: 32.7 g/dL (ref 30.0–36.0)
MCV: 86.1 fL (ref 78.0–100.0)
PLATELETS: 104 10*3/uL — AB (ref 150–400)
RBC: 3.23 MIL/uL — AB (ref 4.22–5.81)
RDW: 18.4 % — ABNORMAL HIGH (ref 11.5–15.5)
WBC: 1.2 10*3/uL — AB (ref 4.0–10.5)

## 2016-08-29 LAB — PHOSPHORUS: Phosphorus: 2.9 mg/dL (ref 2.5–4.6)

## 2016-08-29 LAB — BASIC METABOLIC PANEL
ANION GAP: 7 (ref 5–15)
BUN: 28 mg/dL — AB (ref 6–20)
CALCIUM: 7.2 mg/dL — AB (ref 8.9–10.3)
CO2: 24 mmol/L (ref 22–32)
Chloride: 107 mmol/L (ref 101–111)
Creatinine, Ser: 1.55 mg/dL — ABNORMAL HIGH (ref 0.61–1.24)
GFR calc Af Amer: 50 mL/min — ABNORMAL LOW (ref >60)
GFR, EST NON AFRICAN AMERICAN: 43 mL/min — AB (ref >60)
Glucose, Bld: 109 mg/dL — ABNORMAL HIGH (ref 65–99)
POTASSIUM: 3.4 mmol/L — AB (ref 3.5–5.1)
SODIUM: 138 mmol/L (ref 135–145)

## 2016-08-29 LAB — LACTIC ACID, PLASMA: Lactic Acid, Venous: 2.1 mmol/L (ref 0.5–1.9)

## 2016-08-29 LAB — TROPONIN I
Troponin I: 0.08 ng/mL (ref <0.03)
Troponin I: 0.11 ng/mL (ref <0.03)

## 2016-08-29 LAB — MAGNESIUM: MAGNESIUM: 1.1 mg/dL — AB (ref 1.7–2.4)

## 2016-08-29 LAB — CORTISOL: Cortisol, Plasma: 111.9 ug/dL

## 2016-08-29 LAB — MRSA PCR SCREENING: MRSA BY PCR: NEGATIVE

## 2016-08-29 LAB — ABO/RH: ABO/RH(D): O POS

## 2016-08-29 MED ORDER — PREMIER PROTEIN SHAKE: 2.0000 [oz_av] | ORAL | Status: DC

## 2016-08-29 MED ORDER — PREMIER PROTEIN SHAKE
2.0000 [oz_av] | Freq: Every day | ORAL | Status: DC | PRN
  Filled 2016-08-29: qty 325.31

## 2016-08-29 MED ORDER — MAGNESIUM SULFATE 4 GM/100ML IV SOLN
4.0000 g | Freq: Once | INTRAVENOUS | Status: AC
  Administered 2016-08-29: 4 g via INTRAVENOUS
  Filled 2016-08-29: qty 100

## 2016-08-29 MED ORDER — INSULIN ASPART 100 UNIT/ML ~~LOC~~ SOLN
0.0000 [IU] | Freq: Three times a day (TID) | SUBCUTANEOUS | Status: DC
  Administered 2016-08-29: 5 [IU] via SUBCUTANEOUS
  Administered 2016-08-29: 3 [IU] via SUBCUTANEOUS
  Administered 2016-08-30 (×2): 2 [IU] via SUBCUTANEOUS
  Administered 2016-08-31: 3 [IU] via SUBCUTANEOUS
  Administered 2016-08-31: 2 [IU] via SUBCUTANEOUS

## 2016-08-29 MED ORDER — BOOST PLUS PO LIQD: 237.0000 mL | ORAL | Status: DC

## 2016-08-29 MED ORDER — POTASSIUM CHLORIDE 10 MEQ/100ML IV SOLN
10.0000 meq | INTRAVENOUS | Status: AC
  Administered 2016-08-29 (×2): 10 meq via INTRAVENOUS
  Filled 2016-08-29 (×2): qty 100

## 2016-08-29 MED ORDER — BOOST PLUS PO LIQD
237.0000 mL | Freq: Every day | ORAL | Status: DC | PRN
  Filled 2016-08-29: qty 237

## 2016-08-29 MED ORDER — POTASSIUM CHLORIDE CRYS ER 20 MEQ PO TBCR
40.0000 meq | EXTENDED_RELEASE_TABLET | Freq: Once | ORAL | Status: AC
  Administered 2016-08-29: 40 meq via ORAL
  Filled 2016-08-29: qty 2

## 2016-08-29 MED ORDER — INSULIN ASPART 100 UNIT/ML ~~LOC~~ SOLN
0.0000 [IU] | Freq: Every day | SUBCUTANEOUS | Status: DC
  Administered 2016-08-29: 2 [IU] via SUBCUTANEOUS

## 2016-08-29 MED ORDER — SODIUM CHLORIDE 0.9 % IV SOLN
INTRAVENOUS | Status: DC
  Administered 2016-08-29: 12:00:00 via INTRAVENOUS

## 2016-08-29 NOTE — Progress Notes (Signed)
Initial Nutrition Assessment  DOCUMENTATION CODES:   Non-severe (moderate) malnutrition in context of chronic illness, Morbid obesity  INTERVENTION:  - Will order Boost Plus once/day PRN, this supplement provides 360 kcal and 14 grams of protein. - Will order Premier Protein once/day PRN, this supplement provides 160 kcal and 30 grams of protein. - Encourage PO intakes of meals, supplements (as needed), and beverages. - RD will continue to monitor for additional nutrition-related needs.  NUTRITION DIAGNOSIS:   Increased nutrient needs related to catabolic illness, cancer and cancer related treatments as evidenced by estimated needs.  GOAL:   Patient will meet greater than or equal to 90% of their needs  MONITOR:   PO intake, Supplement acceptance, Weight trends, Labs, I & O's  REASON FOR ASSESSMENT:   Malnutrition Screening Tool  ASSESSMENT:   70 y.o. 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 Burr Medico (just had first dose of second cycle 10/24).He presented to [facility] ED on 08/28/16 with lethargy, hypotension, hypoglycemia. He was apparently at his home and was unable to get up. He attempted to stand but ended up collapsing onto the floor. He had follow-up at the [facility] today so went to that appointment, and while there he was sent to the ED for further evaluation.   Pt seen for MST. BMI indicates morbid obesity. Pt consumed 100% of breakfast this AM and states that in the ED he was provided with chicken broth for dinner which he consumed 100% of and was still hungry. Wife and daughter at bedside. Much of the conversation focused on non-nutrition related items as they enjoyed telling stories about family.  Pt was last seen by RD at [facility] on 07/13/16 and note from that time states pt with difficulty swallowing meat but no other swallowing issues. Pt denied  any problems with swallowing over the few days PTA or with breakfast this AM. Note also stated pt drinking Boost once/day. Pt states that if he is not hungry for lunch he will drink Boost as a meal replacement. He also will drink Premier Protein instead of Boost if CBGs >/= 170 mg/dL at lunchtime.   Physical assessment indicates mild muscle wasting, no fat wasting, mild edema. Per chart review, pt has lost 9 lbs (3% body weight) in the past 1 month which is not significant for time frame. He has also lost 17 lbs (6% body weight) in the past 2 months which is significant for time frame. Pt had reported 30 lb weight loss (10%body weight loss based on CBW) since July; significant for time frame.   Unable to obtain other nutrition-related information at this time but will do so at follow-up. Per notes, [facility] had reported pt with new renal dysfunction.   Medications reviewed; 100 mg IV Solu-Cortef x1 dose yesterday, sliding scale Novolog, 4 g IV Mg sulfate x1 dose today, PRN IV Zofran, 10 mEq IV KCl x2 runs today, 40 mEq oral KCl x1 doe today.   Labs reviewed; CBGs: 104 and 118 mg/dL, K: 3.4 mmol/L, BUN: 28 mg/dL, creatinine: 1.55 mg/dL, Ca: 7.2 mg/dL, Mg: 1.1 mg/dL, GFR: 43 mL/min.  IVF: NS @ 75 mL/hr.    Diet Order:  Diet Heart Room service appropriate? Yes; Fluid consistency: Thin  Skin:  Reviewed, no issues  Last BM:  PTA/unknown  Height:   Ht Readings from Last 1 Encounters:  08/28/16 5\' 9"  (1.753 m)  Weight:   Wt Readings from Last 1 Encounters:  08/29/16 280 lb (127 kg)    Ideal Body Weight:  72.73 kg  BMI:  Body mass index is 41.35 kg/m.  Estimated Nutritional Needs:   Kcal:  1900-2160 (15-17 kcal/kg)  Protein:  110-120 grams  Fluid:  >/= 2 L/day  EDUCATION NEEDS:   Education needs addressed    Jarome Matin, MS, RD, LDN Inpatient Clinical Dietitian Pager # [phone] After hours/weekend pager # [phone]

## 2016-08-29 NOTE — Progress Notes (Signed)
Patient began choking while eating lunch, possible aspiration. Patient was sitting up on side of bed and began coughing. Pt then vomited a large amount of food into trash can- part of vomit appeared to be mucus. Pt reports he has gotten choked like this at home, always when eating meat. Pt appeared to have gotten choked on the pot roast this time. Family present in room. Pt advised to not eat anymore pot roast at this time and to take small bites along with remaining upright for at least 30 minutes after meal. Patient does not appear to have trouble swallowing, but possibly esophageal reflux. MD Kary Kos made aware. HR 113 ST,  O2 Sats 96% 2L/[place]. No signs of respiratory distress. Will continue to monitor.

## 2016-08-29 NOTE — Care Management Note (Signed)
Case Management Note  Patient Details  Name: CHRISTIANJOSEPH ROTHKOPF MRN: [id number] Date of Birth: 1946/02/01  Subjective/Objective:     Hypotensive state required short term levophed.               Action/Plan:  From home with spouse   Expected Discharge Date:   (unknown)               Expected Discharge Plan:  Home/Self Care  In-House Referral:     Discharge planning Services     Post Acute Care Choice:    Choice offered to:     DME Arranged:    DME Agency:     HH Arranged:    HH Agency:     Status of Service:  In process, will continue to follow  If discussed at Long Length of Stay Meetings, dates discussed:    Additional Comments:Date:  August 29, 2016 Chart reviewed for concurrent status and case management needs. Will continue to follow the patient for status change: Discharge Planning: following for needs Expected discharge date: [id number] Velva Harman, BSN, [place], [place]  Leeroy Cha, RN 08/29/2016, 8:38 AM

## 2016-08-29 NOTE — Progress Notes (Signed)
PULMONARY / CRITICAL CARE MEDICINE   Name: [name] MRN: [id number] DOB: August 17, 1946    ADMISSION DATE:  08/28/2016 CONSULTATION DATE:  08/28/16  REFERRING MD:  Tyrone Nine - EDP  CHIEF COMPLAINT:  Weakness  HISTORY OF PRESENT ILLNESS:   [name] is a 70 y.o. 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 Burr Medico (just had first dose of second cycle 10/24).He presented to [facility] ED on 08/28/16 with lethargy, hypotension, hypoglycemia. He was apparently at his home and was unable to get up. He attempted to stand but ended up collapsing onto the floor. He had follow-up at the [facility] today so went to that appointment, and while there he was sent to the ED for further evaluation.   In ED, he remained hypotensive, hypoglycemic, hypothermic. He was given 30 ml/kg of IV fluids but BP unfortunately did not respond; therefore he was started on norepinephrine. He was also given stress dose steroids and PCCM was called for admission. Of notelso having some slurred speech in ED. Per family his BP has been low recently with SBP in low 90s. All of his home BP meds have recently been stopped due to this.      SUBJECTIVE:  Feels better. Wants to eat. Stable ready for tele   VITAL SIGNS: BP (!) 108/58   Pulse (!) 116   Temp 97.9 F (36.6 C) (Oral)   Resp (!) 21   Ht 5\' 9"  (1.753 m)   Wt 280 lb (127 kg)   SpO2 98%   BMI 41.35 kg/m   HEMODYNAMICS:    VENTILATOR SETTINGS:    INTAKE / OUTPUT: I/O last 3 completed shifts: In: [id number] [I.V.:945; Other:30; IV Piggyback:4400] Out: [id number] [Urine:1730]  PHYSICAL EXAMINATION: General:  Obese male sitting in bed in NAD Neuro:  Alert, oriented, non-focal HEENT:  [place]/AT, PERRL, no JVD Cardiovascular:  RRR, no MRG Lungs:  Clear Abdomen:  Soft, non-tender, non-distended Musculoskeletal:  No acute deformity or ROM  limitation. ROM 5/5 Skin:  Grossly intact  LABS:  BMET  Recent Labs Lab 08/28/16 1450 08/29/16 0316  NA 134* 138  K 3.2* 3.4*  CL  --  107  CO2 25 24  BUN 39.2* 28*  CREATININE 2.6* 1.55*  GLUCOSE 36* 109*    Electrolytes  Recent Labs Lab 08/28/16 1450 08/29/16 0316  CALCIUM 8.1* 7.2*  MG  --  1.1*  PHOS  --  2.9    CBC  Recent Labs Lab 08/28/16 1450 08/29/16 0316  WBC 2.3* 1.2*  HGB 9.0* 9.1*  HCT 27.8* 27.8*  PLT 131* 104*    Coag's  Recent Labs Lab 08/28/16 2040  INR 0.95    Sepsis Markers  Recent Labs Lab 08/28/16 2040 08/28/16 2344  LATICACIDVEN 2.4* 2.1*    ABG No results for input(s): PHART, PCO2ART, PO2ART in the last 168 hours.  Liver Enzymes  Recent Labs Lab 08/28/16 1450  AST 33  ALT 30  ALKPHOS 68  BILITOT 0.59  ALBUMIN 2.7*    Cardiac Enzymes  Recent Labs Lab 08/28/16 2040 08/29/16 0316  TROPONINI 0.08* 0.08*    Glucose  Recent Labs Lab 08/28/16 2137 08/28/16 2216 08/28/16 2308 08/28/16 2347 08/29/16 0323 08/29/16 0811  GLUCAP 81 77 79 80 104* 118*    Imaging Dg Chest 2 View  Result Date: 08/28/2016 CLINICAL DATA:  Lethargy EXAM: CHEST  2 VIEW  COMPARISON:  03/15/2016, 05/21/2016 FINDINGS: Cardiac shadow is mildly enlarged but stable. No focal infiltrate or sizable effusion is seen. No acute bony abnormality is noted. IMPRESSION: No active cardiopulmonary disease. Electronically Signed   By: Inez Catalina M.D.   On: 08/28/2016 16:47    STUDIES:  CXR 10/31 > no acute process.  CULTURES: Blood 10/31 >  Sputum 10/31 >  ANTIBIOTICS: Vanco 10/31 >  Zosyn 10/31 >  SIGNIFICANT EVENTS: 10/31 >  admitted to [facility] ICU due to shock, Concern hypovolemic versus septic.  LINES/TUBES:     ASSESSMENT / PLAN:   Shock - concern hypovolemic versus septic.-->favor hypovolemia; now resolved Mild troponin bump - suspect due to demand ischemia; leveled off Hx HTN, HLD, CAD, MI, dCHF (echo from June  2017 with EF 55-60%, grade 1DD). Plan:  Continue IVF's. Trend troponin Continue preadmission ASA, atorvastatin. Hold preadmission imdur, torsemide but may be able to resume 11/2 Empiric abx as above (vanc / zosyn) Follow cultures. 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 Burr Medico. Pancytopenia - likely due to chemo. VTE prophylaxis. Plan:  Will notifiy heme/onc SCD's / LMWH. CBC in AM.   chronic hypoxic respiratory failure - reportedly on 2 L 24/7. Hx OHS/OSA (not on CPAP), COPD per report but PFT's in past did NOT show obstruction (PFT's from May 2015 with FEV1 2.20/72% pred, ratio 82/111% pred). Plan:   Continue supplemental O2 as needed to maintain SpO2 > 92%. Pulmonary hygiene. PRN BD's. CXR intermittently.   Mild hyponatremia - likely due to decreased PO intake. Mild hyperkalemia - likely due to decreased PO intake. AKI. Hypomagnesemia  Pseudohypocalcemia - corrects to 9.14. Plan:   NS @ 75 Correct electrolytes as indicated. BMP in AM.   Hypoglycemia - resolved. (did not eat much today and took insulin) DM 2.  Plan:   SSI.  Slurred speech (CBG 29 treating now)-->resolved Plan:   Hold preadmission cetirizine, citalopram, clonazepam, gabapentin, hydroxyzine, prochlorperazine.   FAMILY  - Updates: patient and family updated in ED by [facility]  - Inter-disciplinary family meet or Palliative Care meeting due by:  11/6  DISCUSSION: 70 year old male with history of recently diagnosed adenocarcinoma of the GE junction (July 2017), now admitted with shock, concern hypovolemic versus septic. No source to date. Feels better. Think he is ready to go to tele. Will cont IVFs, cont ABX as we await culture data given neutropenia and alert oncology of his admission. He seems to be feeling better. I think this was primarily hypovolemia.   Erick Colace ACNP-BC [place] Pager # [phone] OR # [phone] if no answer

## 2016-08-29 NOTE — Progress Notes (Signed)
CRITICAL VALUE ALERT  Critical value received:  Lactic 2.1  Date of notification:  08/29/16  Time of notification:  0025  Critical value read back:Yes.    Nurse who received alert:  Ayesha Mohair RN  MD notified (1st page): Elink  Time of first page:  [phone]

## 2016-08-30 DIAGNOSIS — R739 Hyperglycemia, unspecified: Secondary | ICD-10-CM

## 2016-08-30 DIAGNOSIS — E119 Type 2 diabetes mellitus without complications: Secondary | ICD-10-CM

## 2016-08-30 DIAGNOSIS — R4182 Altered mental status, unspecified: Secondary | ICD-10-CM

## 2016-08-30 DIAGNOSIS — I10 Essential (primary) hypertension: Secondary | ICD-10-CM

## 2016-08-30 DIAGNOSIS — I5030 Unspecified diastolic (congestive) heart failure: Secondary | ICD-10-CM

## 2016-08-30 DIAGNOSIS — N179 Acute kidney failure, unspecified: Secondary | ICD-10-CM

## 2016-08-30 DIAGNOSIS — C160 Malignant neoplasm of cardia: Secondary | ICD-10-CM

## 2016-08-30 DIAGNOSIS — D61810 Antineoplastic chemotherapy induced pancytopenia: Secondary | ICD-10-CM

## 2016-08-30 DIAGNOSIS — D696 Thrombocytopenia, unspecified: Secondary | ICD-10-CM

## 2016-08-30 DIAGNOSIS — I959 Hypotension, unspecified: Secondary | ICD-10-CM

## 2016-08-30 DIAGNOSIS — R578 Other shock: Secondary | ICD-10-CM

## 2016-08-30 LAB — COMPREHENSIVE METABOLIC PANEL
ALBUMIN: 2.6 g/dL — AB (ref 3.5–5.0)
ALT: 27 U/L (ref 17–63)
AST: 35 U/L (ref 15–41)
Alkaline Phosphatase: 52 U/L (ref 38–126)
Anion gap: 6 (ref 5–15)
BUN: 12 mg/dL (ref 6–20)
CHLORIDE: 108 mmol/L (ref 101–111)
CO2: 27 mmol/L (ref 22–32)
Calcium: 7.7 mg/dL — ABNORMAL LOW (ref 8.9–10.3)
Creatinine, Ser: 0.89 mg/dL (ref 0.61–1.24)
GFR calc Af Amer: >60 mL/min (ref >60)
GFR calc non Af Amer: >60 mL/min (ref >60)
GLUCOSE: 89 mg/dL (ref 65–99)
POTASSIUM: 3.3 mmol/L — AB (ref 3.5–5.1)
Sodium: 141 mmol/L (ref 135–145)
Total Bilirubin: 0.7 mg/dL (ref 0.3–1.2)
Total Protein: 5.5 g/dL — ABNORMAL LOW (ref 6.5–8.1)

## 2016-08-30 LAB — GLUCOSE, CAPILLARY
GLUCOSE-CAPILLARY: 150 mg/dL — AB (ref 65–99)
GLUCOSE-CAPILLARY: 134 mg/dL — AB (ref 65–99)
GLUCOSE-CAPILLARY: 123 mg/dL — AB (ref 65–99)
Glucose-Capillary: 29 mg/dL — CL (ref 65–99)
Glucose-Capillary: 110 mg/dL — ABNORMAL HIGH (ref 65–99)

## 2016-08-30 LAB — CBC
HCT: 26.5 % — ABNORMAL LOW (ref 39.0–52.0)
Hemoglobin: 8.5 g/dL — ABNORMAL LOW (ref 13.0–17.0)
MCH: 28.1 pg (ref 26.0–34.0)
MCHC: 32.1 g/dL (ref 30.0–36.0)
MCV: 87.7 fL (ref 78.0–100.0)
PLATELETS: 97 10*3/uL — AB (ref 150–400)
RBC: 3.02 MIL/uL — AB (ref 4.22–5.81)
RDW: 18.7 % — AB (ref 11.5–15.5)
WBC: 1.1 10*3/uL — AB (ref 4.0–10.5)

## 2016-08-30 LAB — VANCOMYCIN, TROUGH: VANCOMYCIN TR: 9 ug/mL — AB (ref 15–20)

## 2016-08-30 MED ORDER — POTASSIUM CHLORIDE CRYS ER 20 MEQ PO TBCR
40.0000 meq | EXTENDED_RELEASE_TABLET | Freq: Two times a day (BID) | ORAL | Status: DC
  Administered 2016-08-30 – 2016-08-31 (×3): 40 meq via ORAL
  Filled 2016-08-30 (×3): qty 2

## 2016-08-30 MED ORDER — TRAZODONE HCL 50 MG PO TABS
100.0000 mg | ORAL_TABLET | Freq: Every evening | ORAL | Status: DC | PRN
  Administered 2016-08-30 (×2): 100 mg via ORAL
  Filled 2016-08-30 (×2): qty 2

## 2016-08-30 MED ORDER — ISOSORBIDE MONONITRATE ER 30 MG PO TB24: 30.0000 mg | ORAL_TABLET | Freq: Every day | ORAL | Status: DC

## 2016-08-30 MED ORDER — CITALOPRAM HYDROBROMIDE 20 MG PO TABS
20.0000 mg | ORAL_TABLET | Freq: Every day | ORAL | Status: DC
  Administered 2016-08-30 – 2016-08-31 (×2): 20 mg via ORAL
  Filled 2016-08-30 (×2): qty 1

## 2016-08-30 MED ORDER — VANCOMYCIN HCL 10 G IV SOLR
1250.0000 mg | Freq: Two times a day (BID) | INTRAVENOUS | Status: DC
  Administered 2016-08-30: 1250 mg via INTRAVENOUS
  Filled 2016-08-30 (×2): qty 1250

## 2016-08-30 MED ORDER — CLONAZEPAM 1 MG PO TABS: 1.0000 mg | ORAL_TABLET | Freq: Two times a day (BID) | ORAL | Status: DC | PRN

## 2016-08-30 NOTE — Progress Notes (Signed)
Pharmacy Antibiotic Note  [name] is a 70 y.o. male admitted on 08/28/2016 with sepsis vs. hypovolemia, with diagnosis favoring hypovolemia. He was recently diagnosed with adenocarcinoma of the GE junction is and currently undergoing chemotherapy. He presented to ED after second round of chemo with lethargy, hypotension, hypoglycemia, and hypothermia. CXR noted no active cardiopulmonary disease. CCM recommends continuing IV antibiotics with Vanc and Zosyn due to leukopenia.   Today, 08/30/16  On day #3 of antibiotics.  Neutropenic d/t ongoing chemotherapy. WBC low at 1.1.   Serum creatinine trending down, with sharp decrease from 1.55 to 0.89 this morning.   RN reports choking and vomiting while eating on 11/1 with suspicion for aspiration.  Plan: - Will check vancomycin trough prior to this evening's dose to assess clearance d/t changing renal function. Pharmacy will f/u and adjust dose if needed.  - Continue Zosyn 3.375gm IV Q8h to be infused over 4hrs - Monitor renal function and cx data  ------------------------------------------ Height: 5\' 9"  (175.3 cm) Weight: 283 lb 8.2 oz (128.6 kg) IBW/kg (Calculated) : 70.7  Temp (24hrs), Avg:98.4 F (36.9 C), Min:97.8 F (36.6 C), Max:98.7 F (37.1 C)   Recent Labs Lab 08/28/16 1450 08/28/16 2040 08/28/16 2344 08/29/16 0316 08/30/16 0325  WBC 2.3*  --   --  1.2* 1.1*  CREATININE 2.6*  --   --  1.55* 0.89  LATICACIDVEN  --  2.4* 2.1*  --   --     Estimated Creatinine Clearance: 101.1 mL/min (by C-G formula based on SCr of 0.89 mg/dL).    No Known Allergies  Antimicrobials this admission: 10/31 Vanc >>  10/31 Zosyn >>   Dose adjustments this admission:  Microbiology results: 10/31 BCx x2:  11/1 MRSA PCR: Neg  Thank you for allowing pharmacy to be a part of this patient's care.  Sallyanne Havers Student-PharmD 08/30/2016 8:19 AM

## 2016-08-30 NOTE — Progress Notes (Addendum)
[name]   DOB:1946-04-28   [id number]   [id number]  Oncology follow-up note  Subjective: Patient is well-known to me, under my care for his esophageal cancer, on chemotherapy. Was admitted on 08/28/2016 for severe hyperglycemia, shock, AKI and mental status change. He is doing much better now. His appetite and eating has improved, no other new complaints. He wants to go home. He is afebrile, vital signs been stable. Cultures so far negative. He still on IV antibiotics.   Objective:  Vitals:   08/29/16 2024 08/30/16 0548  BP: 123/66 121/76  Pulse: 98 (!) 102  Resp: 17 15  Temp: 98.4 F (36.9 C) 98.6 F (37 C)    Body mass index is 41.87 kg/m.  Intake/Output Summary (Last 24 hours) at 08/30/16 1249 Last data filed at 08/30/16 1109  Gross per 24 hour  Intake          3228.75 ml  Output             3575 ml  Net          -346.25 ml     Sclerae unicteric  Oropharynx clear  No peripheral adenopathy  Lungs clear -- no rales or rhonchi  Heart regular rate and rhythm  Abdomen benign  MSK no focal spinal tenderness, no peripheral edema  Neuro nonfocal   CBG (last 3)   Recent Labs  08/29/16 2109 08/30/16 0742 08/30/16 1151  GLUCAP 239* 110* 150*     Labs:  Lab Results  Component Value Date   WBC 1.1 (LL) 08/30/2016   HGB 8.5 (L) 08/30/2016   HCT 26.5 (L) 08/30/2016   MCV 87.7 08/30/2016   PLT 97 (L) 08/30/2016   NEUTROABS 1.2 (L) 08/28/2016    CMP Latest Ref Rng & Units 08/30/2016 08/29/2016 08/28/2016  Glucose 65 - 99 mg/dL 89 109(H) 36(LL)  BUN 6 - 20 mg/dL 12 28(H) 39.2(H)  Creatinine 0.61 - 1.24 mg/dL 0.89 1.55(H) 2.6(H)  Sodium 135 - 145 mmol/L 141 138 134(L)  Potassium 3.5 - 5.1 mmol/L 3.3(L) 3.4(L) 3.2(L)  Chloride 101 - 111 mmol/L 108 107 -  CO2 22 - 32 mmol/L 27 24 25   Calcium 8.9 - 10.3 mg/dL 7.7(L) 7.2(L) 8.1(L)  Total Protein 6.5 - 8.1 g/dL 5.5(L) - 6.4  Total Bilirubin 0.3 - 1.2 mg/dL 0.7 - 0.59  Alkaline Phos 38 - 126 U/L 52 - 68   AST 15 - 41 U/L 35 - 33  ALT 17 - 63 U/L 27 - 30     Urine Studies No results for input(s): UHGB, CRYS in the last 72 hours.  Invalid input(s): UACOL, UAPR, USPG, UPH, UTP, UGL, UKET, UBIL, UNIT, UROB, ULEU, UEPI, UWBC, Junie Panning [place], [place], [place]  Basic Metabolic Panel:  Recent Labs Lab 08/28/16 1450 08/29/16 0316 08/30/16 0325  NA 134* 138 141  K 3.2* 3.4* 3.3*  CL  --  107 108  CO2 25 24 27   GLUCOSE 36* 109* 89  BUN 39.2* 28* 12  CREATININE 2.6* 1.55* 0.89  CALCIUM 8.1* 7.2* 7.7*  MG  --  1.1*  --   PHOS  --  2.9  --    GFR Estimated Creatinine Clearance: 101.1 mL/min (by C-G formula based on SCr of 0.89 mg/dL). Liver Function Tests:  Recent Labs Lab 08/28/16 1450 08/30/16 0325  AST 33 35  ALT 30 27  ALKPHOS 68 52  BILITOT 0.59 0.7  PROT 6.4 5.5*  ALBUMIN 2.7* 2.6*    Recent Labs Lab  08/28/16 2040  LIPASE 52*  AMYLASE 58   No results for input(s): AMMONIA in the last 168 hours. Coagulation profile  Recent Labs Lab 08/28/16 2040  INR 0.95    CBC:  Recent Labs Lab 08/28/16 1450 08/29/16 0316 08/30/16 0325  WBC 2.3* 1.2* 1.1*  NEUTROABS 1.2*  --   --   HGB 9.0* 9.1* 8.5*  HCT 27.8* 27.8* 26.5*  MCV 85.0 86.1 87.7  PLT 131* 104* 97*   Cardiac Enzymes:  Recent Labs Lab 08/28/16 2040 08/29/16 0316 08/29/16 0844  TROPONINI 0.08* 0.08* 0.11*   BNP: Invalid input(s): POCBNP CBG:  Recent Labs Lab 08/29/16 1122 08/29/16 1658 08/29/16 2109 08/30/16 0742 08/30/16 1151  GLUCAP 194* 210* 239* 110* 150*   D-Dimer No results for input(s): DDIMER in the last 72 hours. Hgb A1c No results for input(s): HGBA1C in the last 72 hours. Lipid Profile No results for input(s): CHOL, HDL, LDLCALC, TRIG, CHOLHDL, LDLDIRECT in the last 72 hours. Thyroid function studies  Recent Labs  08/28/16 1632  TSH 2.940   Anemia work up No results for input(s): VITAMINB12, FOLATE, FERRITIN, TIBC, IRON, RETICCTPCT in the last 72  hours. Microbiology Recent Results (from the past 240 hour(s))  Blood culture (routine x 2)     Status: None (Preliminary result)   Collection Time: 08/28/16  4:30 PM  Result Value Ref Range Status   Specimen Description BLOOD LEFT WRIST  Final   Special Requests BOTTLES DRAWN AEROBIC AND ANAEROBIC 5CC  Final   Culture   Final    NO GROWTH 2 DAYS Performed at [facility]    Report Status PENDING  Incomplete  Blood culture (routine x 2)     Status: None (Preliminary result)   Collection Time: 08/28/16  4:45 PM  Result Value Ref Range Status   Specimen Description BLOOD RIGHT ANTECUBITAL  Final   Special Requests BOTTLES DRAWN AEROBIC AND ANAEROBIC 5CC  Final   Culture   Final    NO GROWTH 2 DAYS Performed at [facility]    Report Status PENDING  Incomplete  MRSA PCR Screening     Status: None   Collection Time: 08/29/16 12:41 AM  Result Value Ref Range Status   MRSA by PCR NEGATIVE NEGATIVE Final    Comment:        The GeneXpert MRSA Assay (FDA approved for NASAL specimens only), is one component of a comprehensive MRSA colonization surveillance program. It is not intended to diagnose MRSA infection nor to guide or monitor treatment for MRSA infections.       Studies:  Dg Chest 2 View  Result Date: 08/28/2016 CLINICAL DATA:  Lethargy EXAM: CHEST  2 VIEW COMPARISON:  03/15/2016, 05/21/2016 FINDINGS: Cardiac shadow is mildly enlarged but stable. No focal infiltrate or sizable effusion is seen. No acute bony abnormality is noted. IMPRESSION: No active cardiopulmonary disease. Electronically Signed   By: Inez Catalina M.D.   On: 08/28/2016 16:47    Assessment: 70 y.o. 70 year old Caucasian male, with multiple comorbidities, including hypertension, diabetes, OSA, coronary artery disease, diastolic CHF, on continuous oxygen, morbid obesity, is currently on chemotherapy for his metastatic esophageal cancer  1. Hyperglycemia, resolved 2. Hypotension,  resolved 3. Pancytopenia, secondary to chemotherapy 4. Metastatic GE junction adenocarcinoma, on chemotherapy 5. CAD 6. Diastolic CHF, stable 7. HTN, meds on hold for now  8. DM, NPH on hold for now  9. Morbid obesity   Plan:  -his cultures have been negative so far, I  think it would be OK to discontinue his IV antibiotics -He has not required much insulin, I agree to hold his NPH on discharge, and follow up with PCP next week -hold HTN meds for now, may restart next week if BP increases -I will see him back in my clinic next week -out of bed and ambulate, hope he can go home tomorrow  -I spoke with Dr. Kennith Maes, MD 08/30/2016  12:49 PM

## 2016-08-30 NOTE — Progress Notes (Signed)
PROGRESS NOTE    [name]  [id number] DOB: [birth year] DOA: 08/28/2016 PCP: Tammi Sou, MD    Brief Narrative:  [name] is a 70 y.o. 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 Burr Medico (just had first dose of second cycle 10/24).He presented to [facility] ED on 08/28/16 with lethargy, hypotension, hypoglycemia. He was apparently at his home and was unable to get up. He attempted to stand but ended up collapsing onto the floor. He had follow-up at the [place] today so went to that appointment, and while there he was sent to the ED for further evaluation.   In ED, he remained hypotensive, hypoglycemic, hypothermic. He was given 30 ml/kg of IV fluids but BP unfortunately did not respond; therefore he was started on norepinephrine. He was also given stress dose steroids and PCCM was called for admission. Of notelso having some slurred speech in ED. Per family his BP has been low recently with SBP in low 90s. All of his home BP meds have recently been stopped due to this.   Assessment & Plan:   Active Problems:   Shock ([place])   Septic shock (HCC)   Malnutrition of moderate degree   Shock - concern hypovolemic versus septic.-->favor hypovolemia; now resolved Mild troponin bump - suspect due to demand ischemia; leveled off Hx HTN, HLD, CAD, MI, dCHF (echo from June 2017 with EF 55-60%, grade 1DD). Plan:  Continue IVF's. Continue preadmission ASA, atorvastatin. Hold preadmission imdur, torsemide but may be able to resume 11/3 (blood pressure WNL at this time) Empiric abx as above (vanc / zosyn) Follow cultures. 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 Burr Medico. Pancytopenia - likely due to chemo. VTE prophylaxis. Plan:  Will notifiy  heme/onc SCD's / LMWH. Platelets <100- watch closely Repeat CBCD in am   chronic hypoxic respiratory failure - reportedly on 2 L 24/7. Hx OHS/OSA (not on CPAP), COPD per report but PFT's in past did NOT show obstruction (PFT's from May 2015 with FEV1 2.20/72% pred, ratio 82/111% pred). Plan:   Continue supplemental O2 as needed to maintain SpO2 > 92%. (currently on 2.5L [place]) Pulmonary hygiene. PRN BD's.   Mild hyponatremia - likely due to decreased PO intake. Mild hyperkalemia - likely due to decreased PO intake. AKI. Hypomagnesemia  Pseudohypocalcemia - corrects to 9.14. Plan:   NS @ 75 previously (can d/c as patient tolerating PO) Correct electrolytes as indicated (24mEq BID ordered) BMP in AM   Hypoglycemia - resolved. (did not eat much today and took insulin) DM 2.  Plan:   SSI Blood sugars slightly elevated yesterday Fasting blood sugar at 110 this morning  Slurred speech (CBG 29 treating now)-->resolved Plan:   Restart a few home medication No confusion or slurred speech noted today     DVT prophylaxis: lovenox Code Status: DNR Family Communication: no family bedside, patient updated on plan of care Disposition Plan: plan to discharge when cultures negative   Consultants:   PCCM (consulted at admission)  Procedures:   none  Antimicrobials:   Vancomycin 10/31>  Zosyn 10/31>    Subjective: Patient very upset this morning.  States his breakfast came with 2 regular syrups for pancakes.  Also states that he was told he could possibly leave this morning and voices  he is very upset that he cannot.  Denies chest pain, chest pressure, shortness of breath, increased work of breathing, dysuria, hematuria, melena or hematochezia.    Objective: Vitals:   08/29/16 1100 08/29/16 1500 08/29/16 2024 08/30/16 0548  BP: 132/71 119/67 123/66 121/76  Pulse: (!) 107 (!) 103 98 (!) 102  Resp: 18 16 17 15   Temp: 97.8 F (36.6 C) 98.7 F (37.1 C) 98.4 F  (36.9 C) 98.6 F (37 C)  TempSrc: Oral Axillary Oral Oral  SpO2: 98% 99% 99% 97%  Weight:    128.6 kg (283 lb 8.2 oz)  Height:        Intake/Output Summary (Last 24 hours) at 08/30/16 1055 Last data filed at 08/30/16 0912  Gross per 24 hour  Intake             2905 ml  Output             2675 ml  Net              230 ml   Filed Weights   08/28/16 2300 08/29/16 0400 08/30/16 0548  Weight: 127 kg (280 lb) 127 kg (280 lb) 128.6 kg (283 lb 8.2 oz)    Examination:  General exam: Appears agitated   Respiratory system: Clear to auscultation. Respiratory effort normal. Cardiovascular system: S1 & S2 heard, RRR. No JVD, murmurs, rubs, gallops or clicks. No pedal edema. Gastrointestinal system: Abdomen is nondistended, soft and nontender. No organomegaly or masses felt. Normal bowel sounds heard. Central nervous system: Alert and oriented. No focal neurological deficits. Extremities: Symmetric 5 x 5 power. Skin: No rashes, lesions or ulcers Psychiatry: Judgement and insight appear normal. Mood & affect appropriate.     Data Reviewed: I have personally reviewed following labs and imaging studies  CBC:  Recent Labs Lab 08/28/16 1450 08/29/16 0316 08/30/16 0325  WBC 2.3* 1.2* 1.1*  NEUTROABS 1.2*  --   --   HGB 9.0* 9.1* 8.5*  HCT 27.8* 27.8* 26.5*  MCV 85.0 86.1 87.7  PLT 131* 104* 97*   Basic Metabolic Panel:  Recent Labs Lab 08/28/16 1450 08/29/16 0316 08/30/16 0325  NA 134* 138 141  K 3.2* 3.4* 3.3*  CL  --  107 108  CO2 25 24 27   GLUCOSE 36* 109* 89  BUN 39.2* 28* 12  CREATININE 2.6* 1.55* 0.89  CALCIUM 8.1* 7.2* 7.7*  MG  --  1.1*  --   PHOS  --  2.9  --    GFR: Estimated Creatinine Clearance: 101.1 mL/min (by C-G formula based on SCr of 0.89 mg/dL). Liver Function Tests:  Recent Labs Lab 08/28/16 1450 08/30/16 0325  AST 33 35  ALT 30 27  ALKPHOS 68 52  BILITOT 0.59 0.7  PROT 6.4 5.5*  ALBUMIN 2.7* 2.6*    Recent Labs Lab 08/28/16 2040    LIPASE 52*  AMYLASE 58   No results for input(s): AMMONIA in the last 168 hours. Coagulation Profile:  Recent Labs Lab 08/28/16 2040  INR 0.95   Cardiac Enzymes:  Recent Labs Lab 08/28/16 2040 08/29/16 0316 08/29/16 0844  TROPONINI 0.08* 0.08* 0.11*   BNP (last 3 results) No results for input(s): PROBNP in the last 8760 hours. HbA1C: No results for input(s): HGBA1C in the last 72 hours. CBG:  Recent Labs Lab 08/29/16 0811 08/29/16 1122 08/29/16 1658 08/29/16 2109 08/30/16 0742  GLUCAP 118* 194* 210* 239* 110*   Lipid Profile: No results for input(s): CHOL, HDL, LDLCALC, TRIG,  CHOLHDL, LDLDIRECT in the last 72 hours. Thyroid Function Tests:  Recent Labs  08/28/16 1632  TSH 2.940   Anemia Panel: No results for input(s): VITAMINB12, FOLATE, FERRITIN, TIBC, IRON, RETICCTPCT in the last 72 hours. Sepsis Labs:  Recent Labs Lab 08/28/16 2040 08/28/16 2344  LATICACIDVEN 2.4* 2.1*    Recent Results (from the past 240 hour(s))  Blood culture (routine x 2)     Status: None (Preliminary result)   Collection Time: 08/28/16  4:30 PM  Result Value Ref Range Status   Specimen Description BLOOD LEFT WRIST  Final   Special Requests BOTTLES DRAWN AEROBIC AND ANAEROBIC 5CC  Final   Culture   Final    NO GROWTH 2 DAYS Performed at [facility]    Report Status PENDING  Incomplete  Blood culture (routine x 2)     Status: None (Preliminary result)   Collection Time: 08/28/16  4:45 PM  Result Value Ref Range Status   Specimen Description BLOOD RIGHT ANTECUBITAL  Final   Special Requests BOTTLES DRAWN AEROBIC AND ANAEROBIC 5CC  Final   Culture   Final    NO GROWTH 2 DAYS Performed at [facility]    Report Status PENDING  Incomplete  MRSA PCR Screening     Status: None   Collection Time: 08/29/16 12:41 AM  Result Value Ref Range Status   MRSA by PCR NEGATIVE NEGATIVE Final    Comment:        The GeneXpert MRSA Assay (FDA approved for NASAL  specimens only), is one component of a comprehensive MRSA colonization surveillance program. It is not intended to diagnose MRSA infection nor to guide or monitor treatment for MRSA infections.          Radiology Studies: Dg Chest 2 View  Result Date: 08/28/2016 CLINICAL DATA:  Lethargy EXAM: CHEST  2 VIEW COMPARISON:  03/15/2016, 05/21/2016 FINDINGS: Cardiac shadow is mildly enlarged but stable. No focal infiltrate or sizable effusion is seen. No acute bony abnormality is noted. IMPRESSION: No active cardiopulmonary disease. Electronically Signed   By: Inez Catalina M.D.   On: 08/28/2016 16:47        Scheduled Meds: . aspirin  81 mg Oral Daily  . atorvastatin  80 mg Oral Daily  . enoxaparin (LOVENOX) injection  40 mg Subcutaneous QHS  . insulin aspart  0-15 Units Subcutaneous TID WC  . insulin aspart  0-5 Units Subcutaneous QHS  . piperacillin-tazobactam (ZOSYN)  IV  3.375 g Intravenous Q8H  . potassium chloride  40 mEq Oral BID  . vancomycin  1,500 mg Intravenous Q24H   Continuous Infusions: . sodium chloride 75 mL/hr at 08/29/16 1214     LOS: 2 days    Time spent: 35 minutes    Newman Pies, MD [facility] Pager [phone]  If 7PM-7AM, please contact night-coverage [email or web address] Password [email or web address] 08/30/2016, 10:55 AM

## 2016-08-30 NOTE — Progress Notes (Signed)
Pharmacy Antibiotic Note  [name] is a 70 y.o. male admitted on 08/28/2016 with sepsis vs. hypovolemia, with diagnosis favoring hypovolemia. He was recently diagnosed with adenocarcinoma of the GE junction is and currently undergoing chemotherapy. He presented to ED after second round of chemo with lethargy, hypotension, hypoglycemia, and hypothermia. CXR noted no active cardiopulmonary disease. CCM recommends continuing IV antibiotics with Vanc and Zosyn due to leukopenia.   Today, 08/30/16  On day #3 of antibiotics.  Neutropenic d/t ongoing chemotherapy. WBC low at 1.1.   Serum creatinine trending down, with sharp decrease from 1.55 to 0.89 this morning.   RN reports choking and vomiting while eating on 11/1 with suspicion for aspiration.  Vanc Trough subtherapeutic (goal 15-20 mcg/mL) on dose 1500mg  q24  Per Oncology notes, vancomycin and zosyn can discontinued at this point due to negative cultures  Plan: - Due to low trough, change vanc dose from 1500mg  q24 to 1250mg  q12 but can expect vanc to be discontinued soon per notes - Continue Zosyn 3.375gm IV Q8h to be infused over 4hrs - Monitor renal function and cx data  ------------------------------------------ Height: 5\' 9"  (175.3 cm) Weight: 283 lb 8.2 oz (128.6 kg) IBW/kg (Calculated) : 70.7  Temp (24hrs), Avg:98.6 F (37 C), Min:98.4 F (36.9 C), Max:98.8 F (37.1 C)   Recent Labs Lab 08/28/16 1450 08/28/16 2040 08/28/16 2344 08/29/16 0316 08/30/16 0325 08/30/16 1750  WBC 2.3*  --   --  1.2* 1.1*  --   CREATININE 2.6*  --   --  1.55* 0.89  --   LATICACIDVEN  --  2.4* 2.1*  --   --   --   VANCOTROUGH  --   --   --   --   --  9*    Estimated Creatinine Clearance: 101.1 mL/min (by C-G formula based on SCr of 0.89 mg/dL).    No Known Allergies  Antimicrobials this admission: 10/31 Vanc >>  10/31 Zosyn >>   Dose adjustments this admission:  Microbiology results: 10/31 BCx x2:  11/1 MRSA PCR:  Neg   Thank you for allowing pharmacy to be a part of this patient's care.   Adrian Saran, PharmD, BCPS Pager [phone] 08/30/2016 6:40 PM

## 2016-08-31 ENCOUNTER — Telehealth: Payer: Self-pay | Admitting: Family Medicine

## 2016-08-31 ENCOUNTER — Other Ambulatory Visit: Payer: Self-pay | Admitting: Family Medicine

## 2016-08-31 LAB — CBC WITH DIFFERENTIAL/PLATELET
Basophils Absolute: 0 10*3/uL (ref 0.0–0.1)
Basophils Relative: 0 %
EOS ABS: 0.2 10*3/uL (ref 0.0–0.7)
Eosinophils Relative: 15 %
HEMATOCRIT: 29.4 % — AB (ref 39.0–52.0)
HEMOGLOBIN: 9.1 g/dL — AB (ref 13.0–17.0)
LYMPHS ABS: 0.6 10*3/uL — AB (ref 0.7–4.0)
Lymphocytes Relative: 46 %
MCH: 27.7 pg (ref 26.0–34.0)
MCHC: 31 g/dL (ref 30.0–36.0)
MCV: 89.4 fL (ref 78.0–100.0)
MONOS PCT: 16 %
Monocytes Absolute: 0.2 10*3/uL (ref 0.1–1.0)
NEUTROS PCT: 23 %
Neutro Abs: 0.3 10*3/uL — ABNORMAL LOW (ref 1.7–7.7)
Platelets: 97 10*3/uL — ABNORMAL LOW (ref 150–400)
RBC: 3.29 MIL/uL — ABNORMAL LOW (ref 4.22–5.81)
RDW: 18.7 % — ABNORMAL HIGH (ref 11.5–15.5)
WBC: 1.2 10*3/uL — CL (ref 4.0–10.5)

## 2016-08-31 LAB — BASIC METABOLIC PANEL
Anion gap: 4 — ABNORMAL LOW (ref 5–15)
BUN: 5 mg/dL — AB (ref 6–20)
CHLORIDE: 105 mmol/L (ref 101–111)
CO2: 30 mmol/L (ref 22–32)
Calcium: 8.2 mg/dL — ABNORMAL LOW (ref 8.9–10.3)
Creatinine, Ser: 0.82 mg/dL (ref 0.61–1.24)
GFR calc Af Amer: >60 mL/min (ref >60)
GFR calc non Af Amer: >60 mL/min (ref >60)
GLUCOSE: 123 mg/dL — AB (ref 65–99)
POTASSIUM: 3.9 mmol/L (ref 3.5–5.1)
Sodium: 139 mmol/L (ref 135–145)

## 2016-08-31 LAB — MAGNESIUM: MAGNESIUM: 1.3 mg/dL — AB (ref 1.7–2.4)

## 2016-08-31 LAB — GLUCOSE, CAPILLARY
Glucose-Capillary: 171 mg/dL — ABNORMAL HIGH (ref 65–99)
Glucose-Capillary: 131 mg/dL — ABNORMAL HIGH (ref 65–99)

## 2016-08-31 MED ORDER — MAGNESIUM CHLORIDE 64 MG PO TBEC: 1.0000 | DELAYED_RELEASE_TABLET | Freq: Every day | ORAL | Status: DC

## 2016-08-31 MED ORDER — MAGNESIUM CHLORIDE 64 MG PO TBEC: 1.0000 | DELAYED_RELEASE_TABLET | Freq: Every day | ORAL | 0 refills | Status: AC

## 2016-08-31 MED ORDER — MAGNESIUM SULFATE 4 GM/100ML IV SOLN
4.0000 g | Freq: Once | INTRAVENOUS | Status: AC
  Administered 2016-08-31: 4 g via INTRAVENOUS
  Filled 2016-08-31: qty 100

## 2016-08-31 MED ORDER — POTASSIUM CHLORIDE CRYS ER 20 MEQ PO TBCR: 40.0000 meq | EXTENDED_RELEASE_TABLET | Freq: Two times a day (BID) | ORAL | 0 refills | Status: DC

## 2016-08-31 NOTE — Telephone Encounter (Signed)
Patient called to make hospital fu. First available 09/13/16. Patient has medication changes. Is it okay for patient to wait until 11/16? Please advise.

## 2016-08-31 NOTE — Evaluation (Signed)
Physical Therapy Evaluation Patient Details Name: [name] MRN: [id number] DOB: [birth year] Today's Date: 08/31/2016   History of Present Illness  70 y.o. 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 Burr Medico (just had first dose of second cycle 10/24) and admitted for septic shock  Clinical Impression  Patient evaluated by Physical Therapy with no further acute PT needs identified. All education has been completed and the patient has no further questions.  Pt ambulated in hallway and reports mobility at baseline.  Pt with limited distance gait with rollator at baseline due to dyspnea and fatigue.  Pt states he only goes into community for grocery shopping and church.  Pt reports his family can assist if needed upon d/c, and he reports he will d/c home today. See below for any follow-up Physical Therapy or equipment needs. PT is signing off. Thank you for this referral.      Follow Up Recommendations No PT follow up    Equipment Recommendations  None recommended by PT    Recommendations for Other Services       Precautions / Restrictions Precautions Precautions: Fall Precaution Comments: chronic O2      Mobility  Bed Mobility Overal bed mobility: Modified Independent                Transfers Overall transfer level: Needs assistance Equipment used: Rolling walker (2 wheeled) Transfers: Sit to/from Stand Sit to Stand: Supervision         General transfer comment: supervision for lines  Ambulation/Gait Ambulation/Gait assistance: Min guard;Supervision Ambulation Distance (Feet): 90 Feet Assistive device: Rolling walker (2 wheeled) Gait Pattern/deviations: Step-through pattern;Decreased stride length     General Gait Details: pt reports mobility feels to be baseline, SPO2 97-100% on 2L O2 [name]  Stairs            Wheelchair  Mobility    Modified Rankin (Stroke Patients Only)       Balance Overall balance assessment: History of Falls                                           Pertinent Vitals/Pain Pain Assessment: No/denies pain    Home Living Family/patient expects to be discharged to:: Private residence Living Arrangements: Spouse/significant other;Children Available Help at Discharge: Family Type of Home: House Home Access: Stairs to enter Entrance Stairs-Rails: Psychiatric nurse of Steps: 3-4 Home Layout: One level Home Equipment: Environmental consultant - 4 wheels      Prior Function Level of Independence: Independent with assistive device(s)         Comments: chronic 2L O2     Hand Dominance        Extremity/Trunk Assessment               Lower Extremity Assessment: Overall WFL for tasks assessed         Communication   Communication: HOH  Cognition Arousal/Alertness: Awake/alert Behavior During Therapy: WFL for tasks assessed/performed Overall Cognitive Status: Within Functional Limits for tasks assessed                      General Comments      Exercises     Assessment/Plan    PT Assessment Patent does not need any further PT services  PT Problem List            PT Treatment Interventions      PT Goals (Current goals can be found in the Care Plan section)  Acute Rehab PT Goals PT Goal Formulation: All assessment and education complete, DC therapy    Frequency     Barriers to discharge        Co-evaluation               End of Session Equipment Utilized During Treatment: Oxygen Activity Tolerance: Patient tolerated treatment well Patient left: in bed;with call bell/phone within reach Nurse Communication: Mobility status         Time: [id number] PT Time Calculation (min) (ACUTE ONLY): 22 min   Charges:   PT Evaluation $PT Eval Low Complexity: 1 Procedure     PT G Codes:        [name],[name]  [name] 08/31/2016, 12:36 PM [name], PT, DPT 08/31/2016 Pager: [phone]

## 2016-08-31 NOTE — Progress Notes (Signed)
Pt selected [facility] for HH needs. Referral given to in house rep.  

## 2016-08-31 NOTE — Progress Notes (Signed)
D/C education completed. Answer questions. Medications reviewed. Patient will be D/C home with family in stable condition.

## 2016-08-31 NOTE — Discharge Summary (Signed)
Physician Discharge Summary  [name] [id number] DOB: 05-Jan-1946 DOA: 08/28/2016  PCP: Tammi Sou, MD  Admit date: 08/28/2016 Discharge date: 08/31/2016  Admitted From: Home Disposition:  Home  Recommendations for Outpatient Follow-up:  1. Follow up with PCP in 1-2 weeks 2. Follow up with oncology at previously scheduled appointment 3. Please monitor blood sugars at home 4. Take new prescriptions as prescribed 5. Please obtain BMP/CBC in one week   Home Health: No  Equipment/Devices: Oxygen at 2L [place]  Discharge Condition: Stable CODE STATUS: DNR Diet recommendation: Heart Healthy / Carb Modified  Brief/Interim Summary: [name] a 70 y.o. 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 Burr Medico (just had first dose of second cycle 10/24).He presented to [facility] ED on 08/28/16 with lethargy, hypotension, hypoglycemia. He was apparently at his home and was unable to get up. He attempted to stand but ended up collapsing onto the floor. He had follow-up at the [place] today so went to that appointment, and while there he was sent to the ED for further evaluation.   In ED, he remained hypotensive, hypoglycemic, hypothermic. He was given 30 ml/kg of IV fluids but BP unfortunately did not respond; therefore he was started on norepinephrine. He was also given stress dose steroids and PCCM was called for admission. Of notelso having some slurred speech in ED. Per family his BP has been low recently with SBP in low 90s. All of his home BP meds have recently been stopped due to this.  Was placed on sliding scale insulin but has not required much insulin during admission so was discharged home with instructions to hold insulin.  Was also instructed to hold BP medications at discharge.  Patient told to follow up with PCP next week and to follow up with his  oncologist next week.  Discharge Diagnoses:  Active Problems:   Shock ([place])   Septic shock ([place])   Malnutrition of moderate degree    Discharge Instructions  Discharge Instructions    Call MD for:  difficulty breathing, headache or visual disturbances    Complete by:  As directed    Call MD for:  extreme fatigue    Complete by:  As directed    Call MD for:  persistant dizziness or light-headedness    Complete by:  As directed    Call MD for:  persistant nausea and vomiting    Complete by:  As directed    Call MD for:  redness, tenderness, or signs of infection (pain, swelling, redness, odor or green/yellow discharge around incision site)    Complete by:  As directed    Call MD for:  severe uncontrolled pain    Complete by:  As directed    Call MD for:  temperature >100.4    Complete by:  As directed    Diet - low sodium heart healthy    Complete by:  As directed    Increase activity slowly    Complete by:  As directed        Medication List    STOP taking these medications   glucose blood test strip Commonly known as:  ONE TOUCH ULTRA TEST   Insulin Lispro 200 UNIT/ML Sopn Commonly known as:  HUMALOG KWIKPEN   Insulin NPH (Human) ([place]) 100 UNIT/ML Kiwkpen Commonly known as:  HUMULIN N KWIKPEN   isosorbide mononitrate 60 MG 24  hr tablet Commonly known as:  IMDUR   ONETOUCH DELICA LANCETS [id number] Misc   torsemide 20 MG tablet Commonly known as:  DEMADEX   traMADol 50 MG tablet Commonly known as:  ULTRAM     TAKE these medications   aspirin 81 MG tablet Take 81 mg by mouth daily.   atorvastatin 80 MG tablet Commonly known as:  LIPITOR Take 1 tablet (80 mg total) by mouth daily.   beta carotene w/minerals tablet Take 1 tablet by mouth daily.   cetirizine 10 MG tablet Commonly known as:  ZYRTEC Take 10 mg by mouth daily.   citalopram 20 MG tablet Commonly known as:  CELEXA Take 1 tablet (20 mg total) by mouth daily.   clonazePAM 1 MG  tablet Commonly known as:  KLONOPIN TAKE ONE TABLET BY MOUTH TWICE DAILY AS NEEDED FOR ANXIETY   cromolyn 4 % ophthalmic solution Commonly known as:  OPTICROM Place 1 drop into both eyes 4 (four) times daily.   [employer] FISH OIL 1000 MG Caps Take 2 tablets by mouth 2 (two) times daily.   ezetimibe 10 MG tablet Commonly known as:  ZETIA Take 1 tablet (10 mg total) by mouth daily.   gabapentin 300 MG capsule Commonly known as:  NEURONTIN TAKE ONE CAPSULE BY MOUTH THREE TIMES DAILY   hydrOXYzine 25 MG tablet Commonly known as:  ATARAX/VISTARIL 1 tab at supper and 1 at bedtime (for insomnia)   magnesium chloride 64 MG Tbec SR tablet Commonly known as:  SLOW-MAG Take 1 tablet (64 mg total) by mouth daily. Start taking on:  09/01/2016   metFORMIN 1000 MG tablet Commonly known as:  GLUCOPHAGE TAKE ONE TABLET BY MOUTH TWICE DAILY WITH MEALS   ondansetron 8 MG tablet Commonly known as:  ZOFRAN Take 1 tablet (8 mg total) by mouth 2 (two) times daily as needed for refractory nausea / vomiting. Start on day 3 after chemo.   oxyCODONE 5 MG immediate release tablet Commonly known as:  Oxy IR/ROXICODONE Take 1 tablet (5 mg total) by mouth every 6 (six) hours as needed for severe pain.   OXYGEN Inhale 2 L/min into the lungs continuous.   pantoprazole 40 MG tablet Commonly known as:  PROTONIX TAKE ONE TABLET BY MOUTH TWICE DAILY   potassium chloride SA 20 MEQ tablet Commonly known as:  K-DUR,KLOR-CON Take 2 tablets (40 mEq total) by mouth 2 (two) times daily.   prochlorperazine 10 MG tablet Commonly known as:  COMPAZINE Take 1 tablet (10 mg total) by mouth every 6 (six) hours as needed (Nausea or vomiting).       No Known Allergies  Consultations:  PCCM  Oncology   Procedures/Studies: Dg Chest 2 View  Result Date: 08/28/2016 CLINICAL DATA:  Lethargy EXAM: CHEST  2 VIEW COMPARISON:  03/15/2016, 05/21/2016 FINDINGS: Cardiac shadow is mildly enlarged but stable. No focal  infiltrate or sizable effusion is seen. No acute bony abnormality is noted. IMPRESSION: No active cardiopulmonary disease. Electronically Signed   By: Inez Catalina M.D.   On: 08/28/2016 16:47       Subjective: Patient doing well.  He reports he has been able to ambulate without problem and feels ready to go home.  He reports he can follow up with his oncologist outpatient.  Understands he is to not continue his insulin outpatient (as he has not required much insulin during hospitalization and is eating well) and will not continue BP medications until he follows up with his PCP.   Discharge  Exam: Vitals:   08/30/16 2222 08/31/16 0451  BP: (!) 145/83 (!) 144/86  Pulse: (!) 105 (!) 110  Resp: 18 18  Temp: 98.4 F (36.9 C) 98.8 F (37.1 C)   Vitals:   08/30/16 0548 08/30/16 1413 08/30/16 2222 08/31/16 0451  BP: 121/76 121/77 (!) 145/83 (!) 144/86  Pulse: (!) 102 (!) 104 (!) 105 (!) 110  Resp: 15 18 18 18   Temp: 98.6 F (37 C) 98.8 F (37.1 C) 98.4 F (36.9 C) 98.8 F (37.1 C)  TempSrc: Oral Oral Oral Oral  SpO2: 97% 98% 99% 99%  Weight: 128.6 kg (283 lb 8.2 oz)     Height:        General: Pt is alert, awake, not in acute distress Cardiovascular: RRR, S1/S2 +, no rubs, no gallops Respiratory: CTA bilaterally, no wheezing, no rhonchi Abdominal: Soft, NT, ND, bowel sounds + Extremities: no edema, no cyanosis    The results of significant diagnostics from this hospitalization (including imaging, microbiology, ancillary and laboratory) are listed below for reference.     Microbiology: Recent Results (from the past 240 hour(s))  Blood culture (routine x 2)     Status: None (Preliminary result)   Collection Time: 08/28/16  4:30 PM  Result Value Ref Range Status   Specimen Description BLOOD LEFT WRIST  Final   Special Requests BOTTLES DRAWN AEROBIC AND ANAEROBIC 5CC  Final   Culture   Final    NO GROWTH 2 DAYS Performed at [facility]    Report Status PENDING   Incomplete  Blood culture (routine x 2)     Status: None (Preliminary result)   Collection Time: 08/28/16  4:45 PM  Result Value Ref Range Status   Specimen Description BLOOD RIGHT ANTECUBITAL  Final   Special Requests BOTTLES DRAWN AEROBIC AND ANAEROBIC 5CC  Final   Culture   Final    NO GROWTH 2 DAYS Performed at [facility]    Report Status PENDING  Incomplete  MRSA PCR Screening     Status: None   Collection Time: 08/29/16 12:41 AM  Result Value Ref Range Status   MRSA by PCR NEGATIVE NEGATIVE Final    Comment:        The GeneXpert MRSA Assay (FDA approved for NASAL specimens only), is one component of a comprehensive MRSA colonization surveillance program. It is not intended to diagnose MRSA infection nor to guide or monitor treatment for MRSA infections.      Labs: BNP (last 3 results)  Recent Labs  03/15/16 1015  BNP 7.4   Basic Metabolic Panel:  Recent Labs Lab 08/28/16 1450 08/29/16 0316 08/30/16 0325 08/31/16 0730  NA 134* 138 141 139  K 3.2* 3.4* 3.3* 3.9  CL  --  107 108 105  CO2 25 24 27 30   GLUCOSE 36* 109* 89 123*  BUN 39.2* 28* 12 5*  CREATININE 2.6* 1.55* 0.89 0.82  CALCIUM 8.1* 7.2* 7.7* 8.2*  MG  --  1.1*  --  1.3*  PHOS  --  2.9  --   --    Liver Function Tests:  Recent Labs Lab 08/28/16 1450 08/30/16 0325  AST 33 35  ALT 30 27  ALKPHOS 68 52  BILITOT 0.59 0.7  PROT 6.4 5.5*  ALBUMIN 2.7* 2.6*    Recent Labs Lab 08/28/16 2040  LIPASE 52*  AMYLASE 58   No results for input(s): AMMONIA in the last 168 hours. CBC:  Recent Labs Lab 08/28/16 1450 08/29/16  [id number] 08/30/16 0325 08/31/16 0337  WBC 2.3* 1.2* 1.1* 1.2*  NEUTROABS 1.2*  --   --  0.3*  HGB 9.0* 9.1* 8.5* 9.1*  HCT 27.8* 27.8* 26.5* 29.4*  MCV 85.0 86.1 87.7 89.4  PLT 131* 104* 97* 97*   Cardiac Enzymes:  Recent Labs Lab 08/28/16 2040 08/29/16 0316 08/29/16 0844  TROPONINI 0.08* 0.08* 0.11*   BNP: Invalid input(s): POCBNP CBG:  Recent  Labs Lab 08/30/16 0742 08/30/16 1151 08/30/16 1651 08/30/16 2225 08/31/16 0807  GLUCAP 110* 150* 134* 123* 171*   D-Dimer No results for input(s): DDIMER in the last 72 hours. Hgb A1c No results for input(s): HGBA1C in the last 72 hours. Lipid Profile No results for input(s): CHOL, HDL, LDLCALC, TRIG, CHOLHDL, LDLDIRECT in the last 72 hours. Thyroid function studies  Recent Labs  08/28/16 1632  TSH 2.940   Anemia work up No results for input(s): VITAMINB12, FOLATE, FERRITIN, TIBC, IRON, RETICCTPCT in the last 72 hours. Urinalysis    Component Value Date/Time   COLORURINE YELLOW 08/28/2016 2059   APPEARANCEUR CLEAR 08/28/2016 2059   LABSPEC 1.010 08/28/2016 2059   PHURINE 5.5 08/28/2016 2059   GLUCOSEU NEGATIVE 08/28/2016 2059   GLUCOSEU NEGATIVE 07/01/2015 0910   HGBUR SMALL (A) 08/28/2016 2059   BILIRUBINUR NEGATIVE 08/28/2016 2059   KETONESUR NEGATIVE 08/28/2016 2059   PROTEINUR NEGATIVE 08/28/2016 2059   UROBILINOGEN 0.2 07/01/2015 0910   NITRITE NEGATIVE 08/28/2016 2059   LEUKOCYTESUR NEGATIVE 08/28/2016 2059   Sepsis Labs Invalid input(s): PROCALCITONIN,  WBC,  LACTICIDVEN Microbiology Recent Results (from the past 240 hour(s))  Blood culture (routine x 2)     Status: None (Preliminary result)   Collection Time: 08/28/16  4:30 PM  Result Value Ref Range Status   Specimen Description BLOOD LEFT WRIST  Final   Special Requests BOTTLES DRAWN AEROBIC AND ANAEROBIC 5CC  Final   Culture   Final    NO GROWTH 2 DAYS Performed at [facility]    Report Status PENDING  Incomplete  Blood culture (routine x 2)     Status: None (Preliminary result)   Collection Time: 08/28/16  4:45 PM  Result Value Ref Range Status   Specimen Description BLOOD RIGHT ANTECUBITAL  Final   Special Requests BOTTLES DRAWN AEROBIC AND ANAEROBIC 5CC  Final   Culture   Final    NO GROWTH 2 DAYS Performed at [facility]    Report Status PENDING  Incomplete  MRSA PCR  Screening     Status: None   Collection Time: 08/29/16 12:41 AM  Result Value Ref Range Status   MRSA by PCR NEGATIVE NEGATIVE Final    Comment:        The GeneXpert MRSA Assay (FDA approved for NASAL specimens only), is one component of a comprehensive MRSA colonization surveillance program. It is not intended to diagnose MRSA infection nor to guide or monitor treatment for MRSA infections.      Time coordinating discharge: Over 30 minutes  SIGNED:   Newman Pies, MD  [facility] 08/31/2016, 11:19 AM Pager [phone] If 7PM-7AM, please contact night-coverage [email or web address] Password [email or web address]

## 2016-09-01 DIAGNOSIS — C160 Malignant neoplasm of cardia: Secondary | ICD-10-CM | POA: Diagnosis not present

## 2016-09-01 DIAGNOSIS — Z7982 Long term (current) use of aspirin: Secondary | ICD-10-CM | POA: Diagnosis not present

## 2016-09-01 DIAGNOSIS — Z7984 Long term (current) use of oral hypoglycemic drugs: Secondary | ICD-10-CM | POA: Diagnosis not present

## 2016-09-01 DIAGNOSIS — E440 Moderate protein-calorie malnutrition: Secondary | ICD-10-CM | POA: Diagnosis not present

## 2016-09-01 DIAGNOSIS — E119 Type 2 diabetes mellitus without complications: Secondary | ICD-10-CM | POA: Diagnosis not present

## 2016-09-01 NOTE — Telephone Encounter (Signed)
OK to wait until 09/13/16. Advise pt to call before that time if he is having significant problems with low or high bp or low or high glucoses. Thx

## 2016-09-02 DIAGNOSIS — E119 Type 2 diabetes mellitus without complications: Secondary | ICD-10-CM | POA: Diagnosis not present

## 2016-09-02 DIAGNOSIS — C160 Malignant neoplasm of cardia: Secondary | ICD-10-CM | POA: Diagnosis not present

## 2016-09-02 DIAGNOSIS — Z7984 Long term (current) use of oral hypoglycemic drugs: Secondary | ICD-10-CM | POA: Diagnosis not present

## 2016-09-02 DIAGNOSIS — Z7982 Long term (current) use of aspirin: Secondary | ICD-10-CM | POA: Diagnosis not present

## 2016-09-02 DIAGNOSIS — E440 Moderate protein-calorie malnutrition: Secondary | ICD-10-CM | POA: Diagnosis not present

## 2016-09-02 LAB — CULTURE, BLOOD (ROUTINE X 2)
CULTURE: NO GROWTH
CULTURE: NO GROWTH

## 2016-09-03 ENCOUNTER — Telehealth: Payer: Self-pay

## 2016-09-03 NOTE — Telephone Encounter (Signed)
Do they want him to see a nutritionist through [place]?  Or the "regular" nutritionist referral through the [facility] health system?  Let me know. He needs to continue checking his glucose once in morning when fasting and once in evening before supper OR at bedtime (twice a day).

## 2016-09-03 NOTE — Telephone Encounter (Signed)
Patient has been scheduled for 09/14/16 for a hospital fu. Patient said the hospital told him to stop taking insulin & to stop testing. Patient states that he has not been taking his bp because he doesn't have a way to do it. Please call patient.

## 2016-09-03 NOTE — Telephone Encounter (Signed)
Patty from [place], is calling regarding Duard Greenia. Orders need for nutritional consultation and how often Mr Vonstein need to check glucose.  He is only taking Metformin bid now. Also, FYI patient has weight loss of 30lbs.

## 2016-09-04 ENCOUNTER — Telehealth: Payer: Self-pay | Admitting: Family Medicine

## 2016-09-04 ENCOUNTER — Telehealth: Payer: Self-pay | Admitting: *Deleted

## 2016-09-04 DIAGNOSIS — Z7982 Long term (current) use of aspirin: Secondary | ICD-10-CM | POA: Diagnosis not present

## 2016-09-04 DIAGNOSIS — C160 Malignant neoplasm of cardia: Secondary | ICD-10-CM | POA: Diagnosis not present

## 2016-09-04 DIAGNOSIS — Z7984 Long term (current) use of oral hypoglycemic drugs: Secondary | ICD-10-CM | POA: Diagnosis not present

## 2016-09-04 DIAGNOSIS — E119 Type 2 diabetes mellitus without complications: Secondary | ICD-10-CM | POA: Diagnosis not present

## 2016-09-04 DIAGNOSIS — E440 Moderate protein-calorie malnutrition: Secondary | ICD-10-CM | POA: Diagnosis not present

## 2016-09-04 NOTE — Telephone Encounter (Signed)
noted 

## 2016-09-04 NOTE — Telephone Encounter (Signed)
[name] inquiring to where they want order to nutritionist sent.  Spoke to patient instructing him to check glucose twice a day, fasting and in the morning and evening before supper or at bedtime. Patient verbalized understanding.

## 2016-09-04 NOTE — Telephone Encounter (Signed)
Tye Maryland, PT for [facility] called to make Dr Anitra Lauth aware that patient had a fall yesterday in the shower.  He is not injured only has a small rug burn on his Right Great toe.  PT is going to pick up a 3 in 1 toilet to help prevent falls.  PT states he is very cooperative with PT but is very weak.

## 2016-09-04 NOTE — Telephone Encounter (Signed)
I sent a message to scheduler for lab and f/u this week with me or Lattie Haw.   Truitt Merle MD

## 2016-09-04 NOTE — Telephone Encounter (Signed)
Noted  

## 2016-09-04 NOTE — Telephone Encounter (Signed)
Great. Noted. 

## 2016-09-04 NOTE — Telephone Encounter (Signed)
Returned call to patient, informed him that I have been talking with [facility] about getting orders to them for Nutritionist.  Patient informed about appointment on 09/14/16.  He stated that he did not know about appointment.  I asked if he needed sooner appointment and he stated that this date will be fine.  Reiterated to patient that if he needed to come in sooner that I would be glad to reschedule his appointment for earlier date.  Patients glucose levels and times of monitoring discussed as were his blood pressures.  He stated that levels were staying close to normal and would let us know if they became to high or low.

## 2016-09-04 NOTE — Telephone Encounter (Signed)
Patient calling upset that he never heard back from our office regarding the phone call he made to [place] on Friday.  Patient states he was discharged from the hospital on 08/31/16 and was told to see pcp within a week of discharge.  Patient states he is concerned about all of the medication changes at discharge and needs to get in to see pcp ASAP.  Please advise if pt can be worked in before appt on 11/17 for a hospital followup.  Patient requesting call back from Dr. Idelle Leech assistant as soon as possible this morning to discuss changes in medications.

## 2016-09-04 NOTE — Telephone Encounter (Signed)
Patty from [facility] called back just requesting VO for phone nutrition consult.   Per Dr Anitra Lauth, Faythe Ghee for VO.  She states she will fax a form about VO and it will need signed and faxed back.

## 2016-09-04 NOTE — Telephone Encounter (Signed)
"  I was discharged from the hospital last week.  I'm to see Dr. Burr Medico this week and have not heard anything about an appointment yet."   Will notify Dr. Burr Medico of this request.  Scheduled for lab, F/U and treatment 09-11-2016 but says he is to be seen earlier.  Return number [phone].

## 2016-09-05 DIAGNOSIS — C160 Malignant neoplasm of cardia: Secondary | ICD-10-CM | POA: Diagnosis not present

## 2016-09-05 DIAGNOSIS — E119 Type 2 diabetes mellitus without complications: Secondary | ICD-10-CM | POA: Diagnosis not present

## 2016-09-05 DIAGNOSIS — Z7982 Long term (current) use of aspirin: Secondary | ICD-10-CM | POA: Diagnosis not present

## 2016-09-05 DIAGNOSIS — Z7984 Long term (current) use of oral hypoglycemic drugs: Secondary | ICD-10-CM | POA: Diagnosis not present

## 2016-09-05 DIAGNOSIS — E440 Moderate protein-calorie malnutrition: Secondary | ICD-10-CM | POA: Diagnosis not present

## 2016-09-06 ENCOUNTER — Telehealth: Payer: Self-pay | Admitting: Hematology

## 2016-09-06 DIAGNOSIS — Z7984 Long term (current) use of oral hypoglycemic drugs: Secondary | ICD-10-CM | POA: Diagnosis not present

## 2016-09-06 DIAGNOSIS — C160 Malignant neoplasm of cardia: Secondary | ICD-10-CM | POA: Diagnosis not present

## 2016-09-06 DIAGNOSIS — Z7982 Long term (current) use of aspirin: Secondary | ICD-10-CM | POA: Diagnosis not present

## 2016-09-06 DIAGNOSIS — E119 Type 2 diabetes mellitus without complications: Secondary | ICD-10-CM | POA: Diagnosis not present

## 2016-09-06 DIAGNOSIS — E440 Moderate protein-calorie malnutrition: Secondary | ICD-10-CM | POA: Diagnosis not present

## 2016-09-06 NOTE — Progress Notes (Signed)
[place]  [phone] [phone]  Clinic Follow up Note   Patient Care Team: Tammi Sou, MD as PCP - General (Family Medicine) Ulyses Southward, MD as Consulting Physician (Allergy and Immunology) Calton Dach, MD as Consulting Physician (Optometry) Sueanne Margarita, MD as Consulting Physician (Cardiology) Jerene Bears, MD as Consulting Physician (Gastroenterology) Latanya Maudlin, MD as Consulting Physician (Orthopedic Surgery) Jodi Marble, MD as Consulting Physician (Otolaryngology) Truitt Merle, MD as Consulting Physician (Hematology) Kyung Rudd, MD as Consulting Physician (Radiation Oncology) 09/07/2016    CHIEF COMPLAINTS:  Follow up GE junction cancer  Oncology History   Cancer of cardio-esophageal junction [facility])   Staging form: Stomach, AJCC 7th Edition     Clinical stage from 05/04/2016: Stage IV (TX, N2, M1) - Signed by Truitt Merle, MD on 05/18/2016        Cancer of cardio-esophageal junction ([place])   05/04/2016 Initial Diagnosis    Cancer of cardio-esophageal junction ([place])      05/04/2016 Procedure    EGD showed a large ulcerating mass with no active bleeding at the gastroesophageal junction extending into the gastric cardia. The mass was not obstructing and partially circumferential, extends approximately 5 cm. Nonbleeding erosive gastropathy.      05/04/2016 Initial Biopsy    Esophageal gastric junction biopsy showed a poorly differentiated carcinoma underlying the squamous mucosa. There is lymphovascular invasion. No Intestinal metaplasia. IHC weakly positive CK5/6, p63 (-), favor squamous.        05/09/2016 Imaging    CT CAP w contrast showed mild wall thickening involving the distal esophagus and proximal stomach compatible with known cancer, enlarged mediastinal and upper abdominal lymph nodes are highly suspicious for metastatic adenopathy, propable liver cirrhosis.      06/04/2016 - 06/22/2016 Radiation Therapy   palliative radiation to esophageal cancer       07/06/2016 -  Chemotherapy    Weekly carbo and taxol      07/30/2016 Pathology Results    PD-L1 negative expression      08/28/2016 - 08/31/2016 Hospital Admission    The patient was admitted to 4 severe hypoglycemia, dehydration, and acute renal failure. Infection workup was negative. He recovered well with supportive care. His insulin and hypertension medication was held on discharge, except insulin sliding scale.       GE junction carcinoma ([place])    HISTORY OF PRESENTING ILLNESS:  [name] 70 y.o. male is here because of His reason that diagnosed GE junction carcinoma. He is a comment of by his wife and daughter to our multidisciplinary chart clinic today.  He has been having progressive dysphagia and odynophagia for 2 month, he has been eating soft diet only in the past one week. He has pain in the mid chest and epigastric area only when he eats, with burning sensation, no nausea, abdominal bloating, or other discomfort. His appetite has remained well, no weight loss,   He has multiple medical problems, especially heart failure, he has been on oxygen continuously for 5-6 years. He is morbidly obese, has diabetes, hypertension, mild neuropathy, OSA etc. He has very sedentary life style, he sits in the chair and watch TV most of time during the day. He only comes out for shopping for a few times a week. He uses a Barrister's [employer], he can walk for a few hundrends feet before he has to stop to catch his breasts. He denies cough or sputum production, no GI discomfort, he has  been having mild dirrhea, loose stool, twice daily, no melena or hematochezia.  CURRENT THERAPY: chemotherapy with weekly carboplatin and taxol, started on 07/06/2016  INTERIM HISTORY: [name] returns for follow-up. He is doing better since being at home. He is still tired. He does not have a sliding scale for insulin. He does not have insulin at all and is only  taking metformin. The earliest he can be seen by his primary care physician, Dr. Anitra Lauth is next week so he made an appointment. He has poor appetite since leaving the hospital. He supplements his diet with Boost and Premier Protein. He does not like the taste of Glucerna. He has low energy since leaving the hospital. PT comes to his house to increase his strength. A nurse also comes to the house to check on him. He reports leg swelling, worse in the right than the left. He takes oxycodone 1-2 daily.   MEDICAL HISTORY:  Past Medical History:  Diagnosis Date  . Asthma    as a child  . Cataract    multiple types, bilateral  . Chronic diastolic CHF (congestive heart failure) ([place]) 02/25/2009  . Chronic renal insufficiency, stage III (moderate) 2017   Stage II/III (GFR around 60)  . 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 COPD (chronic obstructive pulmonary disease) ([place])   . Coronary artery disease    chronically occluded LAD and diagonal with right to left collaterals, mild disease in the left circ and moderate disease in the mid RCA on medical management with Imdur, ASA, and Plavix.  . Dermatitis 05/2014  . DIABETES MELLITUS, TYPE II 06/24/2007   No diab retpthy as of 08/05/15 eye exam.  . Esophageal cancer ([place]) 04/2016   poorly differentiated carcinoma (Dr. Pyrtle--EGD).  CT C/A/P showed metastatic adenopathy in mediastinum and upper abdomen 05/09/16.  Tx plan is palliative radiation (completed 06/22/16), then palliative systemic chemotherapy (carbo+taxol) was started but as of 08/03/16 onc f/u this was held due to severe knee and ankle arthralgias.  Restarting as of 08/20/16.  . Essential hypertension 05/01/2007   Qualifier: Diagnosis of  By: Tiney Rouge CMA, Ellison Hughs     . History of kidney stones   . Hyperkalemia 12/2015   Decreased ACE-I by 50% in response, then potassium normalized.  Marland Kitchen HYPERLIPIDEMIA 06/24/2007  . HYPERTENSION 05/01/2007    . Hypoxemia 01/27/2010  . Myocardial infarction    pt states he was informed per MD that he has had one but pt was unaware   . OBESITY 09/02/2008  . Obesity hypoventilation syndrome (HCC)    oxygen 24/7 (2 liters [place] as of 02/2016)  . On home oxygen therapy    Oxygen @ 2l/m nasally 24/7 hours  . OSA (obstructive sleep apnea)    not tested; pt scored 4 per stop bang tool results sent to PCP   . OSTEOARTHRITIS 05/01/2007  . Presbycusis of both ears 05/2015   [facility]  . Pruritic condition 05/2014   Allergist summer 2015, no new testing.  [name] field defect 05/10/16   Per Dr. Melina Fiddler, O.D.: Rt, loss of inf/temp quad and some loss sup/temp.  ?CVA  ? Pituitary tumor? ?Brain met.    SURGICAL HISTORY: Past Surgical History:  Procedure Laterality Date  . APPENDECTOMY    . BALLOON DILATION N/A 05/04/2016   Procedure: BALLOON DILATION;  Surgeon: Jerene Bears, MD;  Location: [facility];  Service: Gastroenterology;  Laterality: N/A;  .  CARDIAC CATHETERIZATION    . CARPAL TUNNEL RELEASE Left   . CATARACT EXTRACTION W/PHACO Right 04/29/2013   Procedure: CATARACT EXTRACTION PHACO AND INTRAOCULAR LENS PLACEMENT (IOC);  Surgeon: Adonis Brook, MD;  Location: [facility];  Service: Ophthalmology;  Laterality: Right;  . CATARACT EXTRACTION, BILATERAL    . COLONOSCOPY W/ POLYPECTOMY  08/2011   Many polyps--all hyperplastic, severe diverticulosis, int hem.  [employer] hemoccult testing via [facility] 06/22/15 NEG  . ESOPHAGOGASTRODUODENOSCOPY N/A 05/04/2016   Procedure: ESOPHAGOGASTRODUODENOSCOPY (EGD);  Surgeon: Jerene Bears, MD;  Location: [facility];  Service: Gastroenterology;  Laterality: N/A;  . KNEE ARTHROSCOPY WITH MEDIAL MENISECTOMY Right 12/16/2014   Procedure: RIGHT KNEE ARTHROSCOPY WITH MEDIAL MENISECTOMY microfracture medial femoral condyle abrasion condroplasty medial femoral condyle lateral menisectomy;  Surgeon: Tobi Bastos, MD;  Location: [facility];  Service: Orthopedics;  Laterality: Right;  . LEFT  HEART CATHETERIZATION WITH CORONARY ANGIOGRAM N/A 10/26/2013   Procedure: LEFT HEART CATHETERIZATION WITH CORONARY ANGIOGRAM;  Surgeon: Jettie Booze, MD;  Location: [facility] CATH LAB;  Service: Cardiovascular;  Laterality: N/A;  . LUMBAR [place] SURGERY  2001  . SHOULDER SURGERY Right    right fx  . TONSILLECTOMY    . TRANSTHORACIC ECHOCARDIOGRAM  03/2016   EF 55-60%, grade I DD, LAE  . WRIST SURGERY Right    right fx    SOCIAL HISTORY: Social History   Social History  . Marital status: Married    Spouse name: susan  . Number of children: 2  . Years of education: N/A   Occupational History  . Retired    Social History Main Topics  . Smoking status: Former Smoker    Packs/day: 1.50    Years: 30.00    Types: Cigarettes, Pipe, Cigars    Quit date: 10/30/1983  . Smokeless tobacco: Former Systems developer    Types: Chew    Quit date: 05/15/2016  . Alcohol use No  . Drug use: No  . Sexual activity: Not Currently   Other Topics Concern  . Not on file   Social History Narrative   Married, one son and one daughter.   His daughter and her two children live with him.   Coffee daily.  Former smoker.  No alcohol.   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   Attends church weekly-   Oxygen continuous       FAMILY HISTORY: Family History  Problem Relation Age of Onset  . Heart attack Mother   . Aneurysm Father   . Alcohol abuse Father   . Diabetes Maternal Aunt   . Colon cancer Neg Hx     ALLERGIES:  has No Known Allergies.  MEDICATIONS:  Current Outpatient Prescriptions  Medication Sig Dispense Refill  . aspirin 81 MG tablet Take 81 mg by mouth daily.      Marland Kitchen atorvastatin (LIPITOR) 80 MG tablet Take 1 tablet (80 mg total) by mouth daily. 90 tablet 3  . beta carotene w/minerals (OCUVITE) tablet Take 1 tablet by mouth daily.    . cetirizine (ZYRTEC) 10 MG tablet Take 10 mg by mouth daily.    . citalopram (CELEXA) 20 MG tablet Take 1 tablet (20 mg total) by  mouth daily. 90 tablet 3  . clonazePAM (KLONOPIN) 1 MG tablet TAKE ONE TABLET BY MOUTH TWICE DAILY AS NEEDED FOR ANXIETY 60 tablet 5  . cromolyn (OPTICROM) 4 % ophthalmic solution Place 1 drop into both eyes 4 (four) times daily.    Marland Kitchen  ezetimibe (ZETIA) 10 MG tablet Take 1 tablet (10 mg total) by mouth daily. 30 tablet 11  . gabapentin (NEURONTIN) 300 MG capsule TAKE ONE CAPSULE BY MOUTH THREE TIMES DAILY 270 capsule 3  . hydrOXYzine (ATARAX/VISTARIL) 25 MG tablet 1 tab at supper and 1 at bedtime (for insomnia) 60 tablet 11  . magnesium chloride (SLOW-MAG) 64 MG TBEC SR tablet Take 1 tablet (64 mg total) by mouth daily. 60 tablet 0  . metFORMIN (GLUCOPHAGE) 1000 MG tablet TAKE ONE TABLET BY MOUTH TWICE DAILY WITH MEALS 180 tablet 1  . Omega-3 Fatty Acids ([employer] FISH OIL) 1000 MG CAPS Take 2 tablets by mouth 2 (two) times daily. 90 capsule   . ondansetron (ZOFRAN) 8 MG tablet Take 1 tablet (8 mg total) by mouth 2 (two) times daily as needed for refractory nausea / vomiting. Start on day 3 after chemo. 30 tablet 1  . oxyCODONE (OXY IR/ROXICODONE) 5 MG immediate release tablet Take 1 tablet (5 mg total) by mouth every 6 (six) hours as needed for severe pain. 60 tablet 0  . OXYGEN Inhale 2 L/min into the lungs continuous.    . pantoprazole (PROTONIX) 40 MG tablet TAKE ONE TABLET BY MOUTH TWICE DAILY 60 tablet 3  . potassium chloride SA (K-DUR,KLOR-CON) 20 MEQ tablet Take 2 tablets (40 mEq total) by mouth 2 (two) times daily. 60 tablet 0  . prochlorperazine (COMPAZINE) 10 MG tablet Take 1 tablet (10 mg total) by mouth every 6 (six) hours as needed (Nausea or vomiting). 30 tablet 1  . isosorbide mononitrate (IMDUR) 60 MG 24 hr tablet      No current facility-administered medications for this visit.     REVIEW OF SYSTEMS:   Constitutional: Denies fevers, chills or abnormal night sweats  (+) decreased appetite, weight loss 9 lb since 08/29/16, malaise/fatigue Eyes: Denies blurriness of vision, double  vision or watery eyes Ears, nose, mouth, throat, and face: Denies mucositis or sore throat Respiratory: Denies cough, dyspnea or wheezes Cardiovascular: Denies palpitation, chest discomfort or lower extremity swelling Gastrointestinal:  Denies nausea, heartburn or change in bowel habits Skin: Denies abnormal skin rashes Lymphatics: Denies new lymphadenopathy or easy bruising Neurological:Denies numbness, tingling or new weaknesses Behavioral/Psych: Mood is stable, no new changes  All other systems were reviewed with the patient and are negative.  PHYSICAL EXAMINATION: ECOG PERFORMANCE STATUS: 3 - Symptomatic, >50% confined to bed  Vitals:   09/07/16 1516  BP: 132/62  Pulse: (!) 109  Resp: 16  Temp: 99.2 F (37.3 C)   Filed Weights   09/07/16 1516  Weight: 271 lb (122.9 kg)    GENERAL:alert, no distress and comfortable, morbid obese, sitting in wheelchair with nasal cannula oxygen  SKIN: skin color, texture, turgor are normal, no rashes or significant lesions EYES: normal, conjunctiva are pink and non-injected, sclera clear OROPHARYNX:no exudate, no erythema and lips, buccal mucosa, and tongue normal  NECK: supple, thyroid normal size, non-tender, without nodularity LYMPH:  no palpable lymphadenopathy in the cervical, axillary or inguinal LUNGS: (+) Scatter rales on bilateral lung base, no wheezing HEART: regular rate & rhythm and no murmurs and no lower extremity edema ABDOMEN:abdomen soft, non-tender and normal bowel sounds Musculoskeletal:no cyanosis of digits and no clubbing  PSYCH: alert & oriented x 3 with fluent speech NEURO: no focal motor/sensory deficits EXT: Trace edema, he wears compression stocks, no significant ankle swollen, erythema, or warmness.  LABORATORY DATA:  I have reviewed the data as listed CBC Latest Ref Rng & Units  09/07/2016 08/31/2016 08/30/2016  WBC 4.0 - 10.3 10e3/uL 4.7 1.2(LL) 1.1(LL)  Hemoglobin 13.0 - 17.1 g/dL 8.3(L) 9.1(L) 8.5(L)   Hematocrit 38.4 - 49.9 % 26.1(L) 29.4(L) 26.5(L)  Platelets 140 - 400 10e3/uL 137(L) 97(L) 97(L)   CMP Latest Ref Rng & Units 09/07/2016 08/31/2016 08/30/2016  Glucose 70 - 140 mg/dl 167(H) 123(H) 89  BUN 7.0 - 26.0 mg/dL 9.6 5(L) 12  Creatinine 0.7 - 1.3 mg/dL 0.9 0.82 0.89  Sodium 136 - 145 mEq/L 130(L) 139 141  Potassium 3.5 - 5.1 mEq/L 5.1 3.9 3.3(L)  Chloride 101 - 111 mmol/L - 105 108  CO2 22 - 29 mEq/L _0 Calcium 8.4 - 10.4 mg/dL 8.6 8.2(L) 7.7(L)  Total Protein 6.4 - 8.3 g/dL 6.7 - 5.5(L)  Total Bilirubin 0.20 - 1.20 mg/dL 0.79 - 0.7  Alkaline Phos 40 - 150 U/L 146 - 52  AST 5 - 34 U/L 65(H) - 35  ALT 0 - 55 U/L 51 - 27    PATHOLOGY REPORT Diagnosis 05/04/2016 1. Esophagogastric junction, biopsy, ulcerative mass - POORLY DIFFERENTIATED CARCINOMA, SEE COMMENT. 2. Stomach, biopsy - REACTIVE GASTROPATHY. - NEGATIVE FOR HELICOBACTER PYLORI. - NO INTESTINAL METAPLASIA, DYSPLASIA, OR MALIGNANCY. Microscopic Comment 1. The majority of the specimen consists of gastroesophageal mucosa with reflux changes. There is a small focus of tumor underlying the squamous mucosa. There is lymphovascular invasion. There is no background intestinal metaplasia (Barrett's esophagus). Immunohistochemistry reveals only very focal weak cytokeratin 5/6, negative p63, and negative mucicarmine in the limited remaining tumor. Dr. Saralyn Pilar has reviewed the case. The case was called to Dr. Hilarie Fredrickson on 05/08/2016. 2. A Warthin-Starry stain is performed to determine the possibility of the presence of Helicobacter pylori. The Warthin-Starry stain is negative for organisms of Helicobacter pylori.  RADIOGRAPHIC STUDIES: I have personally reviewed the radiological images as listed and agreed with the findings in the report. Dg Chest 2 View  Result Date: 08/28/2016 CLINICAL DATA:  Lethargy EXAM: CHEST  2 VIEW COMPARISON:  03/15/2016, 05/21/2016 FINDINGS: Cardiac shadow is mildly enlarged but stable. No  focal infiltrate or sizable effusion is seen. No acute bony abnormality is noted. IMPRESSION: No active cardiopulmonary disease. Electronically Signed   By: Inez Catalina M.D.   On: 08/28/2016 16:47   EGD 05/04/2016 A large, ulcerating mass with no active bleeding and no stigmata of recent bleeding was found at the gastroesophageal junction extending into the gastric cardia, beginning 40 cm from the incisors. The mass was non-obstructing and partially circumferential (involving one-half of the lumen circumference). The mass extends approximately 5 cm. IMPRESSION:  -Likely malignant esophageal tumor was found at the gastroesophageal junction extending into the gastric cardia. Biopsied. - Non-bleeding erosive gastropathy. Biopsied. - Erythematous duodenopathy. - Normal second portion of the duodenum.  ASSESSMENT & PLAN: 70 year old Caucasian male, with multiple comorbidities, including hypertension, diabetes, OSA, coronary artery disease, diastolic CHF, on continuous oxygen, morbid obesity, presented with progressive dysphagia and odynophagia.  1. Cancer of cardio-esophageal junction, poorly differentiated, cTxN2M1, stage IV  -I reviewed his CT scan, EGD, and the biopsy results with patient and his family members in great details. -His biopsy pathology was reviewed in our tumor board 2 days ago, the morphology and weakly positive for CK 5/6, fevers squamous cell carcinoma -His CT scan findings are very concerning for distant metastasis to abdominal lymph nodes.  -I reviewed the PET scan images with patient and his daughter in person, he has hypermetabolic GE junction tumor, and diffuse adenopathy in mediastinum and  upper abdomen. -We discussed the aggressive nature of esophageal cancer, an incurable nature of his disease due to the distant metastasis. The goal of therapy is palliative, to prolong his life and palliate his symptoms. -He has completed palliative radiation -He received weekly carbo  and Taxol. Weeks, and developed severe worsening arthralgia in his knees and ankles, cannot walk, could be related to Taxol, resolved after taking pain meds for 4-5 days -We'll restart chemotherapy the week after Thanksgiving, I'll change his [place] and Taxol back to weekly regimen, he tolerated the every 3 week regiment poorly.  2. CAD, diastolic CHF, on continuous oxygen -He will continue follow-up with his cardiologist Dr. Radford Pax -His Plavix has been held since the EGD and biopsy, he is on baby aspirin. -Need to watch his fluid intake during his chemotherapy, and also avoid fluid overload from chemo  -he is back on lasix, dsypnea improved   3. DM, HTN -We discussed his blood pressure and glucose need to be monitored closely during the chemotherapy, which may affect his blood glucose and blood pressure -He will continue medication, monitor his sugar and blood pressure at home, and follow-up with his primary care physician -will decrease premed dexa before taxol  -History simple has been held due to his borderline low blood pressure -His insulin has been decreased due to decreased by mouth intake -He will see his primary care physician Dr. Anitra Lauth next week to discuss diabetes management -High blood glucose today at 167. He was given written instruction on how to take Humalog sliding scale until he can be seen by his PCP next week  4. OSA, morbid obesity -We discussed healthy diet, I encouraged him to to be more physically active, and consider home PT -I strongly encouraged him to follow-up with dietitian during the therapy for nutrition support.  5. Epigastric pain -Likely secondary to his underlying malignancy -worse after radiation, improved daily -continue tramadol as need   6. Arthralgia -He has baseline osteoarthritis, not physically active -However his arthralgia in his knees and ankle are much worse after chemotherapy -He will use oxycodone 5 mg as needed, constipation  management reviewed with him  7. Anemia -Second is to underlying malignancy and chemotherapy -Slightly worse today, we'll consider blood transfusion if hemoglobin less than 8, for symptomatic anemia  8. Code status  -Her discussed the goal of care, which is palliative. -Patient is very realistic, understands his cancer is not curable, and he agrees with DO NOT RESUSCITATE and DO NOT INTUBATE  Plan -Chemotherapy will be held today based on patient's performance status. We will try again the week after Thanksgiving to allow him more time to recover from recent hospital stay. -Patient was given written instruction on how to take Humalog sliding scale 4-8U before meal until he can be seen by his PCP Dr. Anitra Lauth next week -Refilled oxycodone today -Return to clinic in 3 weeks with lab and chemo (weekly carbo and taxol) appointment, with restaging CT scan a few days before   All questions were answered. The patient knows to call the clinic with any problems, questions or concerns. I spent 30 minutes counseling the patient face to face. The total time spent in the appointment was 35 minutes and more than 35% was on counseling.     Truitt Merle, MD 09/07/2016

## 2016-09-06 NOTE — Telephone Encounter (Signed)
Returned call to patient to confirm next scheduled appointment. °

## 2016-09-07 ENCOUNTER — Telehealth: Payer: Self-pay | Admitting: *Deleted

## 2016-09-07 ENCOUNTER — Ambulatory Visit (HOSPITAL_BASED_OUTPATIENT_CLINIC_OR_DEPARTMENT_OTHER): Payer: Medicare Other | Admitting: Hematology

## 2016-09-07 ENCOUNTER — Encounter: Payer: Self-pay | Admitting: Hematology

## 2016-09-07 ENCOUNTER — Other Ambulatory Visit (HOSPITAL_BASED_OUTPATIENT_CLINIC_OR_DEPARTMENT_OTHER): Payer: Medicare Other

## 2016-09-07 VITALS — BP 132/62 | HR 109 | Temp 99.2°F | Resp 16 | Ht 69.0 in | Wt 271.0 lb

## 2016-09-07 DIAGNOSIS — E119 Type 2 diabetes mellitus without complications: Secondary | ICD-10-CM | POA: Diagnosis not present

## 2016-09-07 DIAGNOSIS — C160 Malignant neoplasm of cardia: Secondary | ICD-10-CM

## 2016-09-07 DIAGNOSIS — J449 Chronic obstructive pulmonary disease, unspecified: Secondary | ICD-10-CM | POA: Diagnosis not present

## 2016-09-07 DIAGNOSIS — I10 Essential (primary) hypertension: Secondary | ICD-10-CM | POA: Diagnosis not present

## 2016-09-07 DIAGNOSIS — I5032 Chronic diastolic (congestive) heart failure: Secondary | ICD-10-CM

## 2016-09-07 LAB — CBC WITH DIFFERENTIAL/PLATELET
BASO%: 0.4 % (ref 0.0–2.0)
Basophils Absolute: 0 10*3/uL (ref 0.0–0.1)
EOS%: 0.2 % (ref 0.0–7.0)
Eosinophils Absolute: 0 10*3/uL (ref 0.0–0.5)
HCT: 26.1 % — ABNORMAL LOW (ref 38.4–49.9)
HGB: 8.3 g/dL — ABNORMAL LOW (ref 13.0–17.1)
LYMPH%: 20.6 % (ref 14.0–49.0)
MCH: 27.8 pg (ref 27.2–33.4)
MCHC: 31.8 g/dL — AB (ref 32.0–36.0)
MCV: 87.3 fL (ref 79.3–98.0)
MONO#: 0.7 10*3/uL (ref 0.1–0.9)
MONO%: 15.5 % — AB (ref 0.0–14.0)
NEUT%: 63.3 % (ref 39.0–75.0)
NEUTROS ABS: 2.9 10*3/uL (ref 1.5–6.5)
PLATELETS: 137 10*3/uL — AB (ref 140–400)
RBC: 2.99 10*6/uL — AB (ref 4.20–5.82)
RDW: 19.5 % — ABNORMAL HIGH (ref 11.0–14.6)
WBC: 4.7 10*3/uL (ref 4.0–10.3)
lymph#: 1 10*3/uL (ref 0.9–3.3)

## 2016-09-07 LAB — COMPREHENSIVE METABOLIC PANEL
ALT: 51 U/L (ref 0–55)
ANION GAP: 8 meq/L (ref 3–11)
AST: 65 U/L — ABNORMAL HIGH (ref 5–34)
Albumin: 2.4 g/dL — ABNORMAL LOW (ref 3.5–5.0)
Alkaline Phosphatase: 146 U/L (ref 40–150)
BUN: 9.6 mg/dL (ref 7.0–26.0)
CHLORIDE: 100 meq/L (ref 98–109)
CO2: 22 meq/L (ref 22–29)
CREATININE: 0.9 mg/dL (ref 0.7–1.3)
Calcium: 8.6 mg/dL (ref 8.4–10.4)
EGFR: 85 mL/min/{1.73_m2} — ABNORMAL LOW (ref >90)
GLUCOSE: 167 mg/dL — AB (ref 70–140)
Potassium: 5.1 mEq/L (ref 3.5–5.1)
SODIUM: 130 meq/L — AB (ref 136–145)
TOTAL PROTEIN: 6.7 g/dL (ref 6.4–8.3)
Total Bilirubin: 0.79 mg/dL (ref 0.20–1.20)

## 2016-09-07 MED ORDER — OXYCODONE HCL 5 MG PO TABS: 5.0000 mg | ORAL_TABLET | Freq: Four times a day (QID) | ORAL | 0 refills | Status: DC | PRN

## 2016-09-07 NOTE — Telephone Encounter (Signed)
Per LOS I have scheduled appts and notified the scheduler 

## 2016-09-10 DIAGNOSIS — Z7984 Long term (current) use of oral hypoglycemic drugs: Secondary | ICD-10-CM | POA: Diagnosis not present

## 2016-09-10 DIAGNOSIS — E440 Moderate protein-calorie malnutrition: Secondary | ICD-10-CM | POA: Diagnosis not present

## 2016-09-10 DIAGNOSIS — Z7982 Long term (current) use of aspirin: Secondary | ICD-10-CM | POA: Diagnosis not present

## 2016-09-10 DIAGNOSIS — E119 Type 2 diabetes mellitus without complications: Secondary | ICD-10-CM | POA: Diagnosis not present

## 2016-09-10 DIAGNOSIS — C160 Malignant neoplasm of cardia: Secondary | ICD-10-CM | POA: Diagnosis not present

## 2016-09-11 ENCOUNTER — Ambulatory Visit: Payer: Medicare Other | Admitting: Hematology

## 2016-09-11 ENCOUNTER — Other Ambulatory Visit: Payer: Medicare Other

## 2016-09-11 ENCOUNTER — Ambulatory Visit: Payer: Medicare Other

## 2016-09-12 DIAGNOSIS — E119 Type 2 diabetes mellitus without complications: Secondary | ICD-10-CM | POA: Diagnosis not present

## 2016-09-12 DIAGNOSIS — Z7984 Long term (current) use of oral hypoglycemic drugs: Secondary | ICD-10-CM | POA: Diagnosis not present

## 2016-09-12 DIAGNOSIS — Z7982 Long term (current) use of aspirin: Secondary | ICD-10-CM | POA: Diagnosis not present

## 2016-09-12 DIAGNOSIS — C160 Malignant neoplasm of cardia: Secondary | ICD-10-CM | POA: Diagnosis not present

## 2016-09-12 DIAGNOSIS — E440 Moderate protein-calorie malnutrition: Secondary | ICD-10-CM | POA: Diagnosis not present

## 2016-09-13 DIAGNOSIS — E119 Type 2 diabetes mellitus without complications: Secondary | ICD-10-CM | POA: Diagnosis not present

## 2016-09-13 DIAGNOSIS — Z7982 Long term (current) use of aspirin: Secondary | ICD-10-CM | POA: Diagnosis not present

## 2016-09-13 DIAGNOSIS — Z7984 Long term (current) use of oral hypoglycemic drugs: Secondary | ICD-10-CM | POA: Diagnosis not present

## 2016-09-13 DIAGNOSIS — E440 Moderate protein-calorie malnutrition: Secondary | ICD-10-CM | POA: Diagnosis not present

## 2016-09-13 DIAGNOSIS — C160 Malignant neoplasm of cardia: Secondary | ICD-10-CM | POA: Diagnosis not present

## 2016-09-14 ENCOUNTER — Ambulatory Visit (INDEPENDENT_AMBULATORY_CARE_PROVIDER_SITE_OTHER): Payer: Medicare Other | Admitting: Family Medicine

## 2016-09-14 ENCOUNTER — Encounter: Payer: Self-pay | Admitting: Family Medicine

## 2016-09-14 VITALS — BP 110/74 | HR 106 | Temp 96.2°F | Resp 18 | Wt 262.4 lb

## 2016-09-14 DIAGNOSIS — N183 Chronic kidney disease, stage 3 unspecified: Secondary | ICD-10-CM

## 2016-09-14 DIAGNOSIS — C155 Malignant neoplasm of lower third of esophagus: Secondary | ICD-10-CM

## 2016-09-14 DIAGNOSIS — E118 Type 2 diabetes mellitus with unspecified complications: Secondary | ICD-10-CM

## 2016-09-14 DIAGNOSIS — Z794 Long term (current) use of insulin: Secondary | ICD-10-CM

## 2016-09-14 DIAGNOSIS — I10 Essential (primary) hypertension: Secondary | ICD-10-CM | POA: Diagnosis not present

## 2016-09-14 DIAGNOSIS — D649 Anemia, unspecified: Secondary | ICD-10-CM | POA: Diagnosis not present

## 2016-09-14 LAB — CBC WITH DIFFERENTIAL/PLATELET
BASOS PCT: 0.5 % (ref 0.0–3.0)
Basophils Absolute: 0 10*3/uL (ref 0.0–0.1)
EOS PCT: 2.9 % (ref 0.0–5.0)
Eosinophils Absolute: 0.1 10*3/uL (ref 0.0–0.7)
HCT: 31.4 % — ABNORMAL LOW (ref 39.0–52.0)
Hemoglobin: 10.3 g/dL — ABNORMAL LOW (ref 13.0–17.0)
LYMPHS ABS: 0.8 10*3/uL (ref 0.7–4.0)
Lymphocytes Relative: 16.2 % (ref 12.0–46.0)
MCHC: 32.7 g/dL (ref 30.0–36.0)
MCV: 86 fl (ref 78.0–100.0)
MONO ABS: 0.5 10*3/uL (ref 0.1–1.0)
Monocytes Relative: 10.3 % (ref 3.0–12.0)
NEUTROS PCT: 70.1 % (ref 43.0–77.0)
Neutro Abs: 3.4 10*3/uL (ref 1.4–7.7)
PLATELETS: 422 10*3/uL — AB (ref 150.0–400.0)
RBC: 3.65 Mil/uL — ABNORMAL LOW (ref 4.22–5.81)
RDW: 21.1 % — AB (ref 11.5–15.5)
WBC: 4.8 10*3/uL (ref 4.0–10.5)

## 2016-09-14 LAB — BASIC METABOLIC PANEL
BUN: 11 mg/dL (ref 6–23)
CHLORIDE: 101 meq/L (ref 96–112)
CO2: 25 mEq/L (ref 19–32)
CREATININE: 0.79 mg/dL (ref 0.40–1.50)
Calcium: 9 mg/dL (ref 8.4–10.5)
GFR: 102.7 mL/min (ref >60.00)
GLUCOSE: 134 mg/dL — AB (ref 70–99)
POTASSIUM: 5.5 meq/L — AB (ref 3.5–5.1)
Sodium: 135 mEq/L (ref 135–145)

## 2016-09-14 NOTE — Progress Notes (Signed)
OFFICE VISIT  09/14/2016   CC:  Chief Complaint  Patient presents with  . Hospitalization Follow-up    Hypotension and hypoglycemia   HPI:    Patient is a 70 y.o. Caucasian male who presents for hospital f/u. Admitted 10/31-11/3, 2017--discharged to home.  Admission dx was septic shock.  Critical care medicine managed him and he had to be on pressors for a while.  His insulins were stopped due to low/normal glucoses and bp meds stopped due to hypo/normo tension.    He continues to feel weak, is not eating well---due to esophageal cancer.  Currently in the midst of palliative chemotherapy for this, due to restart it after Thanksgiving (Dr. Burr Medico). Continues to use home oxygen for COPD and obesity hypoventilation syndrome.    Eats very little b/c chemo has altered his taste.  Supplements sometimes with Boost.  Checking glucoses: from 09/04/16 to 09/07/16: 171-232, some readings morning and some evening. Takes SS humalog with meals lately but is on no NPH any more.  He denies any  prob with LE swelling since getting off his diuretic.   Past Medical History:  Diagnosis Date  . Asthma    as a child  . Cataract    multiple types, bilateral  . Chronic diastolic CHF (congestive heart failure) ([place]) 02/25/2009  . Chronic renal insufficiency, stage III (moderate) 2017   Stage II/III (GFR around 60)  . 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 COPD (chronic obstructive pulmonary disease) ([place])   . Coronary artery disease    chronically occluded LAD and diagonal with right to left collaterals, mild disease in the left circ and moderate disease in the mid RCA on medical management with Imdur, ASA, and Plavix.  . Dermatitis 05/2014  . DIABETES MELLITUS, TYPE II 06/24/2007   No diab retpthy as of 08/05/15 eye exam.  . Esophageal cancer ([place]) 04/2016   poorly differentiated carcinoma (Dr. Pyrtle--EGD).  CT C/A/P showed metastatic  adenopathy in mediastinum and upper abdomen 05/09/16.  Tx plan is palliative radiation (completed 06/22/16), then palliative systemic chemotherapy (carbo+taxol) was started but as of 08/03/16 onc f/u this was held due to severe knee and ankle arthralgias.  Restarting as of 08/20/16.  . Essential hypertension 05/01/2007   Qualifier: Diagnosis of  By: Tiney Rouge CMA, Ellison Hughs     . History of kidney stones   . Hyperkalemia 12/2015   Decreased ACE-I by 50% in response, then potassium normalized.  Marland Kitchen HYPERLIPIDEMIA 06/24/2007  . HYPERTENSION 05/01/2007  . Hypoxemia 01/27/2010  . Myocardial infarction    pt states he was informed per MD that he has had one but pt was unaware   . OBESITY 09/02/2008  . Obesity hypoventilation syndrome ([facility])    oxygen 24/7 (2 liters [place] as of 02/2016)  . On home oxygen therapy    Oxygen @ 2l/m nasally 24/7 hours  . OSA (obstructive sleep apnea)    not tested; pt scored 4 per stop bang tool results sent to PCP   . OSTEOARTHRITIS 05/01/2007  . Presbycusis of both ears 05/2015   [facility]  . Pruritic condition 05/2014   Allergist summer 2015, no new testing.  [name] field defect 05/10/16   Per Dr. Melina Fiddler, O.D.: Rt, loss of inf/temp quad and some loss sup/temp.  ?CVA  ? Pituitary tumor? ?Brain met.    Past Surgical History:  Procedure Laterality Date  . APPENDECTOMY    .  BALLOON DILATION N/A 05/04/2016   Procedure: BALLOON DILATION;  Surgeon: Jerene Bears, MD;  Location: [facility];  Service: Gastroenterology;  Laterality: N/A;  . CARDIAC CATHETERIZATION    . CARPAL TUNNEL RELEASE Left   . CATARACT EXTRACTION W/PHACO Right 04/29/2013   Procedure: CATARACT EXTRACTION PHACO AND INTRAOCULAR LENS PLACEMENT (IOC);  Surgeon: Adonis Brook, MD;  Location: [facility];  Service: Ophthalmology;  Laterality: Right;  . CATARACT EXTRACTION, BILATERAL    . COLONOSCOPY W/ POLYPECTOMY  08/2011   Many polyps--all hyperplastic, severe diverticulosis, int hem.  [employer] hemoccult testing via [facility] 06/22/15  NEG  . ESOPHAGOGASTRODUODENOSCOPY N/A 05/04/2016   Procedure: ESOPHAGOGASTRODUODENOSCOPY (EGD);  Surgeon: Jerene Bears, MD;  Location: [facility];  Service: Gastroenterology;  Laterality: N/A;  . KNEE ARTHROSCOPY WITH MEDIAL MENISECTOMY Right 12/16/2014   Procedure: RIGHT KNEE ARTHROSCOPY WITH MEDIAL MENISECTOMY microfracture medial femoral condyle abrasion condroplasty medial femoral condyle lateral menisectomy;  Surgeon: Tobi Bastos, MD;  Location: [facility];  Service: Orthopedics;  Laterality: Right;  . LEFT HEART CATHETERIZATION WITH CORONARY ANGIOGRAM N/A 10/26/2013   Procedure: LEFT HEART CATHETERIZATION WITH CORONARY ANGIOGRAM;  Surgeon: Jettie Booze, MD;  Location: [facility];  Service: Cardiovascular;  Laterality: N/A;  . LUMBAR [place] SURGERY  2001  . SHOULDER SURGERY Right    right fx  . TONSILLECTOMY    . TRANSTHORACIC ECHOCARDIOGRAM  03/2016   EF 55-60%, grade I DD, LAE  . WRIST SURGERY Right    right fx    Outpatient Medications Prior to Visit  Medication Sig Dispense Refill  . aspirin 81 MG tablet Take 81 mg by mouth daily.      Marland Kitchen atorvastatin (LIPITOR) 80 MG tablet Take 1 tablet (80 mg total) by mouth daily. 90 tablet 3  . beta carotene w/minerals (OCUVITE) tablet Take 1 tablet by mouth daily.    . cetirizine (ZYRTEC) 10 MG tablet Take 10 mg by mouth daily.    . citalopram (CELEXA) 20 MG tablet Take 1 tablet (20 mg total) by mouth daily. 90 tablet 3  . clonazePAM (KLONOPIN) 1 MG tablet TAKE ONE TABLET BY MOUTH TWICE DAILY AS NEEDED FOR ANXIETY 60 tablet 5  . cromolyn (OPTICROM) 4 % ophthalmic solution Place 1 drop into both eyes 4 (four) times daily.    Marland Kitchen ezetimibe (ZETIA) 10 MG tablet Take 1 tablet (10 mg total) by mouth daily. 30 tablet 11  . gabapentin (NEURONTIN) 300 MG capsule TAKE ONE CAPSULE BY MOUTH THREE TIMES DAILY 270 capsule 3  . hydrOXYzine (ATARAX/VISTARIL) 25 MG tablet 1 tab at supper and 1 at bedtime (for insomnia) 60 tablet 11  . magnesium chloride  (SLOW-MAG) 64 MG TBEC SR tablet Take 1 tablet (64 mg total) by mouth daily. 60 tablet 0  . metFORMIN (GLUCOPHAGE) 1000 MG tablet TAKE ONE TABLET BY MOUTH TWICE DAILY WITH MEALS 180 tablet 1  . Omega-3 Fatty Acids ([employer] FISH OIL) 1000 MG CAPS Take 2 tablets by mouth 2 (two) times daily. 90 capsule   . ondansetron (ZOFRAN) 8 MG tablet Take 1 tablet (8 mg total) by mouth 2 (two) times daily as needed for refractory nausea / vomiting. Start on day 3 after chemo. 30 tablet 1  . oxyCODONE (OXY IR/ROXICODONE) 5 MG immediate release tablet Take 1 tablet (5 mg total) by mouth every 6 (six) hours as needed for severe pain. 60 tablet 0  . OXYGEN Inhale 2 L/min into the lungs continuous.    . pantoprazole (PROTONIX) 40 MG  tablet TAKE ONE TABLET BY MOUTH TWICE DAILY 60 tablet 3  . potassium chloride SA (K-DUR,KLOR-CON) 20 MEQ tablet Take 2 tablets (40 mEq total) by mouth 2 (two) times daily. 60 tablet 0  . prochlorperazine (COMPAZINE) 10 MG tablet Take 1 tablet (10 mg total) by mouth every 6 (six) hours as needed (Nausea or vomiting). 30 tablet 1  . isosorbide mononitrate (IMDUR) 60 MG 24 hr tablet      No facility-administered medications prior to visit.     No Known Allergies  ROS As per HPI  PE: Blood pressure 110/74, pulse (!) 106, temperature (!) 96.2 F (35.7 C), temperature source Temporal, resp. rate 18, weight 262 lb 6.4 oz (119 kg), SpO2 100 %. on 2 L oxygen Gen: Alert, tired- appearing but in NAD.  Patient is oriented to person, place, time, and situation. AFFECT: pleasant, lucid thought and speech.  He smiled and made good eye contact. IPJ:ASNK: no injection, icteris, swelling, or exudate.  EOMI, PERRLA. Mouth: lips without lesion/swelling.  Oral mucosa pink and moist. Oropharynx without erythema, exudate, or swelling.  CV: Regular, tachy to about 105, no audible m/r/g Chest is clear, no wheezing or rales. Normal symmetric air entry throughout both lung fields. No chest wall deformities or  tenderness. EXT: no clubbing or cyanosis.  Trace pitting on R LL, 1+ pitting on L LL.  LABS:    Chemistry      Component Value Date/Time   NA 130 (L) 09/07/2016 1433   K 5.1 09/07/2016 1433   CL 105 08/31/2016 0730   CO2 22 09/07/2016 1433   BUN 9.6 09/07/2016 1433   CREATININE 0.9 09/07/2016 1433      Component Value Date/Time   CALCIUM 8.6 09/07/2016 1433   ALKPHOS 146 09/07/2016 1433   AST 65 (H) 09/07/2016 1433   ALT 51 09/07/2016 1433   BILITOT 0.79 09/07/2016 1433     Lab Results  Component Value Date   WBC 4.7 09/07/2016   HGB 8.3 (L) 09/07/2016   HCT 26.1 (L) 09/07/2016   MCV 87.3 09/07/2016   PLT 137 (L) 09/07/2016   Lab Results  Component Value Date   HGBA1C 4.8 07/10/2016   IMPRESSION AND PLAN:  1) Hospitalization for septic shock: resolved, he has overcome this nicely.  2) DM 2, recent hypoglycemia so he was taken off insulins. Continue metformin.  Glucoses now coming back up, so we'll restart Humulin NPH very tentatively:  Restart Humulin NPH at 5 U qAM and 5 U qPM. Continue SS with humalog.   3) HTN: bp has been low of late, all bp meds and diuretics have been stopped. I won't restart any of these today. Checking BMET today with Mag: we'll see if he can come off his K and Mag since he's been off diuretic lately.  4) Normocytic anemia: recheck CBC today.  5) Chronic renal insufficiency stage III: follow lytes/cr today.  6) Esophageal cancer: continue with plans for palliative chemo with Dr. Burr Medico the week after Thanksgiving.  An After Visit Summary was printed and given to the patient.  FOLLOW UP: Return in about 4 weeks (around 10/12/2016) for routine chronic illness f/u (30 min).  Signed:  Crissie Sickles, MD           09/14/2016

## 2016-09-14 NOTE — Progress Notes (Signed)
Pre visit review using our clinic review tool, if applicable. No additional management support is needed unless otherwise documented below in the visit note. 

## 2016-09-17 DIAGNOSIS — E119 Type 2 diabetes mellitus without complications: Secondary | ICD-10-CM | POA: Diagnosis not present

## 2016-09-17 DIAGNOSIS — Z7982 Long term (current) use of aspirin: Secondary | ICD-10-CM | POA: Diagnosis not present

## 2016-09-17 DIAGNOSIS — C160 Malignant neoplasm of cardia: Secondary | ICD-10-CM | POA: Diagnosis not present

## 2016-09-17 DIAGNOSIS — Z7984 Long term (current) use of oral hypoglycemic drugs: Secondary | ICD-10-CM | POA: Diagnosis not present

## 2016-09-17 DIAGNOSIS — E440 Moderate protein-calorie malnutrition: Secondary | ICD-10-CM | POA: Diagnosis not present

## 2016-09-18 DIAGNOSIS — Z7984 Long term (current) use of oral hypoglycemic drugs: Secondary | ICD-10-CM | POA: Diagnosis not present

## 2016-09-18 DIAGNOSIS — Z7982 Long term (current) use of aspirin: Secondary | ICD-10-CM | POA: Diagnosis not present

## 2016-09-18 DIAGNOSIS — E119 Type 2 diabetes mellitus without complications: Secondary | ICD-10-CM | POA: Diagnosis not present

## 2016-09-18 DIAGNOSIS — C160 Malignant neoplasm of cardia: Secondary | ICD-10-CM | POA: Diagnosis not present

## 2016-09-18 DIAGNOSIS — E440 Moderate protein-calorie malnutrition: Secondary | ICD-10-CM | POA: Diagnosis not present

## 2016-09-25 DIAGNOSIS — E119 Type 2 diabetes mellitus without complications: Secondary | ICD-10-CM | POA: Diagnosis not present

## 2016-09-25 DIAGNOSIS — E440 Moderate protein-calorie malnutrition: Secondary | ICD-10-CM | POA: Diagnosis not present

## 2016-09-25 DIAGNOSIS — Z7982 Long term (current) use of aspirin: Secondary | ICD-10-CM | POA: Diagnosis not present

## 2016-09-25 DIAGNOSIS — C160 Malignant neoplasm of cardia: Secondary | ICD-10-CM | POA: Diagnosis not present

## 2016-09-25 DIAGNOSIS — Z7984 Long term (current) use of oral hypoglycemic drugs: Secondary | ICD-10-CM | POA: Diagnosis not present

## 2016-09-26 ENCOUNTER — Encounter (HOSPITAL_COMMUNITY): Payer: Self-pay

## 2016-09-26 ENCOUNTER — Ambulatory Visit: Payer: Medicare Other | Admitting: Cardiology

## 2016-09-26 ENCOUNTER — Ambulatory Visit (HOSPITAL_COMMUNITY)
Admission: RE | Admit: 2016-09-26 | Discharge: 2016-09-26 | Disposition: A | Discharge status: Home / Self Care | Payer: Medicare Other | Source: Ambulatory Visit | Attending: Hematology | Admitting: Hematology

## 2016-09-26 DIAGNOSIS — K5730 Diverticulosis of large intestine without perforation or abscess without bleeding: Secondary | ICD-10-CM | POA: Insufficient documentation

## 2016-09-26 DIAGNOSIS — I2510 Atherosclerotic heart disease of native coronary artery without angina pectoris: Secondary | ICD-10-CM | POA: Insufficient documentation

## 2016-09-26 DIAGNOSIS — I700 Atherosclerosis of aorta: Secondary | ICD-10-CM | POA: Diagnosis not present

## 2016-09-26 DIAGNOSIS — K8020 Calculus of gallbladder without cholecystitis without obstruction: Secondary | ICD-10-CM | POA: Insufficient documentation

## 2016-09-26 DIAGNOSIS — K769 Liver disease, unspecified: Secondary | ICD-10-CM | POA: Diagnosis not present

## 2016-09-26 DIAGNOSIS — C160 Malignant neoplasm of cardia: Secondary | ICD-10-CM | POA: Insufficient documentation

## 2016-09-26 DIAGNOSIS — R590 Localized enlarged lymph nodes: Secondary | ICD-10-CM | POA: Insufficient documentation

## 2016-09-26 MED ORDER — IOPAMIDOL (ISOVUE-300) INJECTION 61%
INTRAVENOUS | Status: AC
  Filled 2016-09-26: qty 100

## 2016-09-26 MED ORDER — IOPAMIDOL (ISOVUE-300) INJECTION 61%
100.0000 mL | Freq: Once | INTRAVENOUS | Status: AC | PRN
  Administered 2016-09-26: 100 mL via INTRAVENOUS

## 2016-09-26 MED ORDER — SODIUM CHLORIDE 0.9 % IJ SOLN
INTRAMUSCULAR | Status: AC
  Filled 2016-09-26: qty 50

## 2016-09-27 DIAGNOSIS — E440 Moderate protein-calorie malnutrition: Secondary | ICD-10-CM | POA: Diagnosis not present

## 2016-09-27 DIAGNOSIS — Z7982 Long term (current) use of aspirin: Secondary | ICD-10-CM | POA: Diagnosis not present

## 2016-09-27 DIAGNOSIS — E119 Type 2 diabetes mellitus without complications: Secondary | ICD-10-CM | POA: Diagnosis not present

## 2016-09-27 DIAGNOSIS — Z7984 Long term (current) use of oral hypoglycemic drugs: Secondary | ICD-10-CM | POA: Diagnosis not present

## 2016-09-27 DIAGNOSIS — C160 Malignant neoplasm of cardia: Secondary | ICD-10-CM | POA: Diagnosis not present

## 2016-09-27 NOTE — Progress Notes (Signed)
[place]  [phone] [phone]  Clinic Follow up Note   Patient Care Team: Tammi Sou, MD as PCP - General (Family Medicine) Ulyses Southward, MD as Consulting Physician (Allergy and Immunology) Calton Dach, MD as Consulting Physician (Optometry) Sueanne Margarita, MD as Consulting Physician (Cardiology) Jerene Bears, MD as Consulting Physician (Gastroenterology) Latanya Maudlin, MD as Consulting Physician (Orthopedic Surgery) Jodi Marble, MD as Consulting Physician (Otolaryngology) Truitt Merle, MD as Consulting Physician (Hematology) Kyung Rudd, MD as Consulting Physician (Radiation Oncology) 09/28/2016    CHIEF COMPLAINTS:  Follow up GE junction cancer  Oncology History   Cancer of cardio-esophageal junction [facility])   Staging form: Stomach, AJCC 7th Edition     Clinical stage from 05/04/2016: Stage IV (TX, N2, M1) - Signed by Truitt Merle, MD on 05/18/2016        Cancer of cardio-esophageal junction ([place])   05/04/2016 Initial Diagnosis    Cancer of cardio-esophageal junction ([place])      05/04/2016 Procedure    EGD showed a large ulcerating mass with no active bleeding at the gastroesophageal junction extending into the gastric cardia. The mass was not obstructing and partially circumferential, extends approximately 5 cm. Nonbleeding erosive gastropathy.      05/04/2016 Initial Biopsy    Esophageal gastric junction biopsy showed a poorly differentiated carcinoma underlying the squamous mucosa. There is lymphovascular invasion. No Intestinal metaplasia. IHC weakly positive CK5/6, p63 (-), favor squamous.        05/09/2016 Imaging    CT CAP w contrast showed mild wall thickening involving the distal esophagus and proximal stomach compatible with known cancer, enlarged mediastinal and upper abdominal lymph nodes are highly suspicious for metastatic adenopathy, propable liver cirrhosis.      06/04/2016 - 06/22/2016 Radiation Therapy   palliative radiation to esophageal cancer       07/06/2016 -  Chemotherapy    Weekly carbo and taxol      07/30/2016 Pathology Results    PD-L1 negative expression      08/28/2016 - 08/31/2016 Hospital Admission    The patient was admitted to 4 severe hypoglycemia, dehydration, and acute renal failure. Infection workup was negative. He recovered well with supportive care. His insulin and hypertension medication was held on discharge, except insulin sliding scale.       GE junction carcinoma ([place])    HISTORY OF PRESENTING ILLNESS:  [name] 70 y.o. male is here because of His reason that diagnosed GE junction carcinoma. He is a comment of by his wife and daughter to our multidisciplinary chart clinic today.  He has been having progressive dysphagia and odynophagia for 2 month, he has been eating soft diet only in the past one week. He has pain in the mid chest and epigastric area only when he eats, with burning sensation, no nausea, abdominal bloating, or other discomfort. His appetite has remained well, no weight loss,   He has multiple medical problems, especially heart failure, he has been on oxygen continuously for 5-6 years. He is morbidly obese, has diabetes, hypertension, mild neuropathy, OSA etc. He has very sedentary life style, he sits in the chair and watch TV most of time during the day. He only comes out for shopping for a few times a week. He uses a Barr[employer], he can walk for a few hundrends feet before he has to stop to catch his breasts. He denies cough or sputum production, no GI discomfort, he has  been having mild dirrhea, loose stool, twice daily, no melena or hematochezia.  CURRENT THERAPY: chemotherapy with weekly carboplatin and taxol, started on 07/06/2016, held 08/21/16-09/28/2016 due to hospitalization   INTERIM HISTORY: [name] returns for follow-up. Accompanied by his daughter. He presents in wheelchair with nasal cannula in place. He has been doing  well and feels better today than at his last visit. His appetite is better and he has gained some weight. He has not gained enough strength in his legs to go up stairs - he is working with physical therapy. He has improved and is now able to stand on his own. He has not had much pain. He denies any problems swallowing. He has had issues being accessed on his arms. He denies bowel issues or headaches. He has pain medication at home to be used as needed.   MEDICAL HISTORY:  Past Medical History:  Diagnosis Date  . Asthma    as a child  . Cataract    multiple types, bilateral  . Chronic diastolic CHF (congestive heart failure) ([place]) 02/25/2009  . Chronic renal insufficiency, stage III (moderate) 2017   Stage II/III (GFR around 60)  . 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 COPD (chronic obstructive pulmonary disease) ([place])   . Coronary artery disease    chronically occluded LAD and diagonal with right to left collaterals, mild disease in the left circ and moderate disease in the mid RCA on medical management with Imdur, ASA, and Plavix.  . Dermatitis 05/2014  . DIABETES MELLITUS, TYPE II 06/24/2007   No diab retpthy as of 08/05/15 eye exam.  . 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 Pyrtle--EGD).  CT C/A/P showed metastatic adenopathy in mediastinum and upper abdomen 05/09/16.  Tx plan is palliative radiation (completed 06/22/16), then palliative systemic chemotherapy (carbo+taxol) was started but as of 08/03/16 onc f/u this was held due to severe knee and ankle arthralgias.  Restarting as of 08/20/16.  . Essential hypertension 05/01/2007   Qualifier: Diagnosis of  By: Tiney Rouge CMA, Ellison Hughs     . History of kidney stones   . Hyperkalemia 12/2015   Decreased ACE-I by 50% in response, then potassium normalized.  Marland Kitchen HYPERLIPIDEMIA 06/24/2007  . HYPERTENSION 05/01/2007  . Hypoxemia 01/27/2010  . Myocardial infarction      pt states he was informed per MD that he has had one but pt was unaware   . OBESITY 09/02/2008  . Obesity hypoventilation syndrome (HCC)    oxygen 24/7 (2 liters [place] as of 02/2016)  . On home oxygen therapy    Oxygen @ 2l/m nasally 24/7 hours  . OSA (obstructive sleep apnea)    not tested; pt scored 4 per stop bang tool results sent to PCP   . OSTEOARTHRITIS 05/01/2007  . Presbycusis of both ears 05/2015   [facility]  . Pruritic condition 05/2014   Allergist summer 2015, no new testing.  [name] field defect 05/10/16   Per Dr. Melina Fiddler, O.D.: Rt, loss of inf/temp quad and some loss sup/temp.  ?CVA  ? Pituitary tumor? ?Brain met.    SURGICAL HISTORY: Past Surgical History:  Procedure Laterality Date  . APPENDECTOMY    . BALLOON DILATION N/A 05/04/2016   Procedure: BALLOON DILATION;  Surgeon: Jerene Bears, MD;  Location: [facility];  Service: Gastroenterology;  Laterality: N/A;  . CARDIAC CATHETERIZATION    . CARPAL TUNNEL RELEASE  Left   . CATARACT EXTRACTION W/PHACO Right 04/29/2013   Procedure: CATARACT EXTRACTION PHACO AND INTRAOCULAR LENS PLACEMENT (IOC);  Surgeon: Adonis Brook, MD;  Location: [facility];  Service: Ophthalmology;  Laterality: Right;  . CATARACT EXTRACTION, BILATERAL    . COLONOSCOPY W/ POLYPECTOMY  08/2011   Many polyps--all hyperplastic, severe diverticulosis, int hem.  [employer] hemoccult testing via [facility] 06/22/15 NEG  . ESOPHAGOGASTRODUODENOSCOPY N/A 05/04/2016   Procedure: ESOPHAGOGASTRODUODENOSCOPY (EGD);  Surgeon: Jerene Bears, MD;  Location: [facility];  Service: Gastroenterology;  Laterality: N/A;  . KNEE ARTHROSCOPY WITH MEDIAL MENISECTOMY Right 12/16/2014   Procedure: RIGHT KNEE ARTHROSCOPY WITH MEDIAL MENISECTOMY microfracture medial femoral condyle abrasion condroplasty medial femoral condyle lateral menisectomy;  Surgeon: Tobi Bastos, MD;  Location: [facility];  Service: Orthopedics;  Laterality: Right;  . LEFT HEART CATHETERIZATION WITH CORONARY ANGIOGRAM N/A  10/26/2013   Procedure: LEFT HEART CATHETERIZATION WITH CORONARY ANGIOGRAM;  Surgeon: Jettie Booze, MD;  Location: [facility] CATH LAB;  Service: Cardiovascular;  Laterality: N/A;  . LUMBAR [place] SURGERY  2001  . SHOULDER SURGERY Right    right fx  . TONSILLECTOMY    . TRANSTHORACIC ECHOCARDIOGRAM  03/2016   EF 55-60%, grade I DD, LAE  . WRIST SURGERY Right    right fx    SOCIAL HISTORY: Social History   Social History  . Marital status: Married    Spouse name: susan  . Number of children: 2  . Years of education: N/A   Occupational History  . Retired    Social History Main Topics  . Smoking status: Former Smoker    Packs/day: 1.50    Years: 30.00    Types: Cigarettes, Pipe, Cigars    Quit date: 10/30/1983  . Smokeless tobacco: Former Systems developer    Types: Chew    Quit date: 05/15/2016  . Alcohol use No  . Drug use: No  . Sexual activity: Not Currently   Other Topics Concern  . Not on file   Social History Narrative   Married, one son and one daughter.   His daughter and her two children live with him.   Coffee daily.  Former smoker.  No alcohol.   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   Attends church weekly-   Oxygen continuous       FAMILY HISTORY: Family History  Problem Relation Age of Onset  . Heart attack Mother   . Aneurysm Father   . Alcohol abuse Father   . Diabetes Maternal Aunt   . Colon cancer Neg Hx     ALLERGIES:  has No Known Allergies.  MEDICATIONS:  Current Outpatient Prescriptions  Medication Sig Dispense Refill  . aspirin 81 MG tablet Take 81 mg by mouth daily.      Marland Kitchen atorvastatin (LIPITOR) 80 MG tablet Take 1 tablet (80 mg total) by mouth daily. 90 tablet 3  . beta carotene w/minerals (OCUVITE) tablet Take 1 tablet by mouth daily.    . cetirizine (ZYRTEC) 10 MG tablet Take 10 mg by mouth daily.    . citalopram (CELEXA) 20 MG tablet Take 1 tablet (20 mg total) by mouth daily. 90 tablet 3  . clonazePAM  (KLONOPIN) 1 MG tablet TAKE ONE TABLET BY MOUTH TWICE DAILY AS NEEDED FOR ANXIETY 60 tablet 5  . cromolyn (OPTICROM) 4 % ophthalmic solution Place 1 drop into both eyes 4 (four) times daily.    Marland Kitchen ezetimibe (ZETIA) 10 MG tablet Take 1 tablet (  10 mg total) by mouth daily. 30 tablet 11  . gabapentin (NEURONTIN) 300 MG capsule TAKE ONE CAPSULE BY MOUTH THREE TIMES DAILY 270 capsule 3  . hydrOXYzine (ATARAX/VISTARIL) 25 MG tablet 1 tab at supper and 1 at bedtime (for insomnia) 60 tablet 11  . magnesium chloride (SLOW-MAG) 64 MG TBEC SR tablet Take 1 tablet (64 mg total) by mouth daily. 60 tablet 0  . metFORMIN (GLUCOPHAGE) 1000 MG tablet TAKE ONE TABLET BY MOUTH TWICE DAILY WITH MEALS 180 tablet 1  . Omega-3 Fatty Acids ([employer] FISH OIL) 1000 MG CAPS Take 2 tablets by mouth 2 (two) times daily. 90 capsule   . ondansetron (ZOFRAN) 8 MG tablet Take 1 tablet (8 mg total) by mouth 2 (two) times daily as needed for refractory nausea / vomiting. Start on day 3 after chemo. 30 tablet 1  . oxyCODONE (OXY IR/ROXICODONE) 5 MG immediate release tablet Take 1 tablet (5 mg total) by mouth every 6 (six) hours as needed for severe pain. 60 tablet 0  . OXYGEN Inhale 2 L/min into the lungs continuous.    . pantoprazole (PROTONIX) 40 MG tablet TAKE ONE TABLET BY MOUTH TWICE DAILY 60 tablet 3  . prochlorperazine (COMPAZINE) 10 MG tablet Take 1 tablet (10 mg total) by mouth every 6 (six) hours as needed (Nausea or vomiting). 30 tablet 1   No current facility-administered medications for this visit.    Facility-Administered Medications Ordered in Other Visits  Medication Dose Route Frequency Provider Last Rate Last Dose  . CARBOplatin (PARAPLATIN) 300 mg in sodium chloride 0.9 % 250 mL chemo infusion  300 mg Intravenous Once Truitt Merle, MD      . dexamethasone (DECADRON) injection 10 mg  10 mg Intravenous Once Truitt Merle, MD      . diphenhydrAMINE (BENADRYL) injection 25 mg  25 mg Intravenous Once Truitt Merle, MD      . famotidine  (PEPCID) IVPB 20 mg premix  20 mg Intravenous Once Truitt Merle, MD      . PACLitaxel (TAXOL) 180 mg in sodium chloride 0.9 % 250 mL chemo infusion (</= 15m/m2)  70 mg/m2 (Treatment Plan Recorded) Intravenous Once YTruitt Merle MD      . palonosetron (ALOXI) injection 0.25 mg  0.25 mg Intravenous Once YTruitt Merle MD        REVIEW OF SYSTEMS:   Constitutional: Denies fevers, chills or abnormal night sweats   Eyes: Denies blurriness of vision, double vision or watery eyes Ears, nose, mouth, throat, and face: Denies mucositis or sore throat Respiratory: Denies cough, dyspnea or wheezes Cardiovascular: Denies palpitation, chest discomfort or lower extremity swelling Gastrointestinal:  Denies nausea, heartburn or change in bowel habits Skin: Denies abnormal skin rashes Lymphatics: Denies new lymphadenopathy or easy bruising Neurological:Denies numbness, tingling or new weaknesses (+) leg weakness, slowly improving with physical therapy Behavioral/Psych: Mood is stable, no new changes  All other systems were reviewed with the patient and are negative.  PHYSICAL EXAMINATION: ECOG PERFORMANCE STATUS: 3 - Symptomatic, >50% confined to bed  Vitals:   09/28/16 0944  BP: 126/65  Pulse: (!) 110  Resp: 18  Temp: 99.9 F (37.7 C)   Filed Weights   09/28/16 0944  Weight: 266 lb 4.8 oz (120.8 kg)    GENERAL:alert, no distress and comfortable, morbid obese, sitting in wheelchair with nasal cannula oxygen  SKIN: skin color, texture, turgor are normal, no rashes or significant lesions EYES: normal, conjunctiva are pink and non-injected, sclera clear OROPHARYNX:no exudate, no erythema  and lips, buccal mucosa, and tongue normal  NECK: supple, thyroid normal size, non-tender, without nodularity LYMPH:  no palpable lymphadenopathy in the cervical, axillary or inguinal LUNGS: clear, no wheezing HEART: regular rate & rhythm and no murmurs and no lower extremity edema ABDOMEN:abdomen soft, non-tender and  normal bowel sounds Musculoskeletal:no cyanosis of digits and no clubbing  PSYCH: alert & oriented x 3 with fluent speech NEURO: no focal motor/sensory deficits EXT: Trace edema, he wears compression stocks, no significant ankle swollen, erythema, or warmness.  LABORATORY DATA:  I have reviewed the data as listed CBC Latest Ref Rng & Units 09/28/2016 09/14/2016 09/07/2016  WBC 4.0 - 10.3 10e3/uL 5.2 4.8 4.7  Hemoglobin 13.0 - 17.1 g/dL 10.6(L) 10.3(L) 8.3(L)  Hematocrit 38.4 - 49.9 % 33.7(L) 31.4(L) 26.1(L)  Platelets 140 - 400 10e3/uL 170 422.0(H) 137(L)   CMP Latest Ref Rng & Units 09/28/2016 09/14/2016 09/07/2016  Glucose 70 - 140 mg/dl 168(H) 134(H) 167(H)  BUN 7.0 - 26.0 mg/dL 7.8 11 9.6  Creatinine 0.7 - 1.3 mg/dL 0.8 0.79 0.9  Sodium 136 - 145 mEq/L 138 135 130(L)  Potassium 3.5 - 5.1 mEq/L 4.1 5.5(H) 5.1  Chloride 96 - 112 mEq/L - 101 -  CO2 22 - 29 mEq/L _0 Calcium 8.4 - 10.4 mg/dL 9.1 9.0 8.6  Total Protein 6.4 - 8.3 g/dL 6.6 - 6.7  Total Bilirubin 0.20 - 1.20 mg/dL 0.73 - 0.79  Alkaline Phos 40 - 150 U/L 100 - 146  AST 5 - 34 U/L 27 - 65(H)  ALT 0 - 55 U/L 22 - 51    PATHOLOGY REPORT Diagnosis 05/04/2016 1. Esophagogastric junction, biopsy, ulcerative mass - POORLY DIFFERENTIATED CARCINOMA, SEE COMMENT. 2. Stomach, biopsy - REACTIVE GASTROPATHY. - NEGATIVE FOR HELICOBACTER PYLORI. - NO INTESTINAL METAPLASIA, DYSPLASIA, OR MALIGNANCY. Microscopic Comment 1. The majority of the specimen consists of gastroesophageal mucosa with reflux changes. There is a small focus of tumor underlying the squamous mucosa. There is lymphovascular invasion. There is no background intestinal metaplasia ([place]'s esophagus). Immunohistochemistry reveals only very focal weak cytokeratin 5/6, negative p63, and negative mucicarmine in the limited remaining tumor. Dr. Saralyn Pilar has reviewed the case. The case was called to Dr. Hilarie Fredrickson on 05/08/2016. 2. A Warthin-Starry stain is performed  to determine the possibility of the presence of Helicobacter pylori. The Warthin-Starry stain is negative for organisms of Helicobacter pylori.     RADIOGRAPHIC STUDIES: I have personally reviewed the radiological images as listed and agreed with the findings in the report. Ct Chest W Contrast  Result Date: 09/26/2016 CLINICAL DATA:  Followup esophagogastric junction adenocarcinoma. Ongoing chemotherapy. Restaging. EXAM: CT CHEST, ABDOMEN, AND PELVIS WITH CONTRAST TECHNIQUE: Multidetector CT imaging of the chest, abdomen and pelvis was performed following the standard protocol during bolus administration of intravenous contrast. CONTRAST:  157m ISOVUE-300 IOPAMIDOL (ISOVUE-300) INJECTION 61% COMPARISON:  05/09/2016 FINDINGS: CT CHEST FINDINGS Cardiovascular: No acute findings. Three-vessel coronary artery calcification. Aortic atherosclerosis. Mediastinum/Lymph Nodes: Right paraesophageal lymphadenopathy in the inferior mediastinum has resolved since previous study. Subcarinal lymph node measures 9 mm on image 28/2 compared to 10 mm previously. High right paratracheal lymph node measures 7 mm on image 11/2 compared to 9 mm previously. No new or increased lymphadenopathy identified. Lungs/Pleura: No pulmonary infiltrate or mass identified. No effusion present. Stable mild bilateral lower lobe scarring. Musculoskeletal: No suspicious bone lesions or other significant abnormality. CT ABDOMEN AND PELVIS FINDINGS Hepatobiliary: Stable sub-cm low-attenuation lesion in the liver dome on image 36/2 is too  small to characterize. No other liver masses identified. Enlarged caudate lobe and capsular nodularity, suspicious for hepatic cirrhosis. Tiny calcified gallstones again seen, without evidence cholecystitis or biliary ductal dilatation. Pancreas:  No mass or inflammatory changes. Spleen:  Within normal limits in size and appearance. Adrenals/Urinary tract: No masses or hydronephrosis. Stable small bilateral  renal cysts. Stomach/Bowel: Masslike soft tissue prominence at the gastroesophageal junction has decreased since previous study. No evidence of obstruction, inflammatory process, or abnormal fluid collections. Sigmoid diverticulosis again demonstrated, without evidence of diverticulitis. Vascular/Lymphatic: No pathologically enlarged lymph nodes identified. There has been resolution of lymphadenopathy in the gastrohepatic ligament since prior study. No new lymphadenopathy identified. No abdominal aortic aneurysm.  Aortic atherosclerosis. Reproductive:  No mass or other significant abnormality identified. Other:  None. Musculoskeletal:  No suspicious bone lesions identified. IMPRESSION: Decreased masslike soft tissue prominence at gastroesophageal junction, consistent with decreased size of primary gastroesophageal junction carcinoma. Resolution of paraesophageal and gastrohepatic ligament lymphadenopathy since prior exam. Other sub-cm mediastinal lymph nodes show little or no significant change. Stable indeterminate sub-cm low-attenuation lesion in the liver dome. Probable hepatic cirrhosis. Recommend continued attention on follow-up CT. No new or progressive metastatic disease identified. Cholelithiasis.  No radiographic evidence of cholecystitis. Colonic diverticulosis. No radiographic evidence of diverticulitis. Aortic atherosclerosis and three-vessel coronary artery calcification. Electronically Signed   By: Earle Gell M.D.   On: 09/26/2016 16:35   Ct Abdomen Pelvis W Contrast  Result Date: 09/26/2016 CLINICAL DATA:  Followup esophagogastric junction adenocarcinoma. Ongoing chemotherapy. Restaging. EXAM: CT CHEST, ABDOMEN, AND PELVIS WITH CONTRAST TECHNIQUE: Multidetector CT imaging of the chest, abdomen and pelvis was performed following the standard protocol during bolus administration of intravenous contrast. CONTRAST:  115m ISOVUE-300 IOPAMIDOL (ISOVUE-300) INJECTION 61% COMPARISON:  05/09/2016  FINDINGS: CT CHEST FINDINGS Cardiovascular: No acute findings. Three-vessel coronary artery calcification. Aortic atherosclerosis. Mediastinum/Lymph Nodes: Right paraesophageal lymphadenopathy in the inferior mediastinum has resolved since previous study. Subcarinal lymph node measures 9 mm on image 28/2 compared to 10 mm previously. High right paratracheal lymph node measures 7 mm on image 11/2 compared to 9 mm previously. No new or increased lymphadenopathy identified. Lungs/Pleura: No pulmonary infiltrate or mass identified. No effusion present. Stable mild bilateral lower lobe scarring. Musculoskeletal: No suspicious bone lesions or other significant abnormality. CT ABDOMEN AND PELVIS FINDINGS Hepatobiliary: Stable sub-cm low-attenuation lesion in the liver dome on image 36/2 is too small to characterize. No other liver masses identified. Enlarged caudate lobe and capsular nodularity, suspicious for hepatic cirrhosis. Tiny calcified gallstones again seen, without evidence cholecystitis or biliary ductal dilatation. Pancreas:  No mass or inflammatory changes. Spleen:  Within normal limits in size and appearance. Adrenals/Urinary tract: No masses or hydronephrosis. Stable small bilateral renal cysts. Stomach/Bowel: Masslike soft tissue prominence at the gastroesophageal junction has decreased since previous study. No evidence of obstruction, inflammatory process, or abnormal fluid collections. Sigmoid diverticulosis again demonstrated, without evidence of diverticulitis. Vascular/Lymphatic: No pathologically enlarged lymph nodes identified. There has been resolution of lymphadenopathy in the gastrohepatic ligament since prior study. No new lymphadenopathy identified. No abdominal aortic aneurysm.  Aortic atherosclerosis. Reproductive:  No mass or other significant abnormality identified. Other:  None. Musculoskeletal:  No suspicious bone lesions identified. IMPRESSION: Decreased masslike soft tissue prominence at  gastroesophageal junction, consistent with decreased size of primary gastroesophageal junction carcinoma. Resolution of paraesophageal and gastrohepatic ligament lymphadenopathy since prior exam. Other sub-cm mediastinal lymph nodes show little or no significant change. Stable indeterminate sub-cm low-attenuation lesion in the liver dome. Probable hepatic  cirrhosis. Recommend continued attention on follow-up CT. No new or progressive metastatic disease identified. Cholelithiasis.  No radiographic evidence of cholecystitis. Colonic diverticulosis. No radiographic evidence of diverticulitis. Aortic atherosclerosis and three-vessel coronary artery calcification. Electronically Signed   By: Earle Gell M.D.   On: 09/26/2016 16:35   EGD 05/04/2016 A large, ulcerating mass with no active bleeding and no stigmata of recent bleeding was found at the gastroesophageal junction extending into the gastric cardia, beginning 40 cm from the incisors. The mass was non-obstructing and partially circumferential (involving one-half of the lumen circumference). The mass extends approximately 5 cm. IMPRESSION:  -Likely malignant esophageal tumor was found at the gastroesophageal junction extending into the gastric cardia. Biopsied. - Non-bleeding erosive gastropathy. Biopsied. - Erythematous duodenopathy. - Normal second portion of the duodenum.  ASSESSMENT & PLAN: 70 year old Caucasian male, with multiple comorbidities, including hypertension, diabetes, OSA, coronary artery disease, diastolic CHF, on continuous oxygen, morbid obesity, presented with progressive dysphagia and odynophagia.  1. Cancer of cardio-esophageal junction, poorly differentiated, cTxN2M1, stage IV  -I previously reviewed his CT scan, EGD, and the biopsy results with patient and his family members in great details. -His biopsy pathology was reviewed in our tumor board 2 days ago, the morphology and weakly positive for CK 5/6, fevers squamous cell  carcinoma -His CT scan findings are very concerning for distant metastasis to abdominal lymph nodes.  -I previously reviewed the PET scan images with patient and his daughter in person, he has hypermetabolic GE junction tumor, and diffuse adenopathy in mediastinum and upper abdomen. -We previously discussed the aggressive nature of esophageal cancer, an incurable nature of his disease due to the distant metastasis. The goal of therapy is palliative, to prolong his life and palliate his symptoms. -His tumor was negative for PD-L1, not a candidate for immunotherapy  -He has completed palliative radiation -He received weekly carbo and Taxol. Weeks, and developed severe worsening arthralgia in his knees and ankles, cannot walk, could be related to Taxol, resolved after taking pain meds for 4-5 days -He has recovered very well from his recent hospitalization, was to restart chemotherapy. I'll do weekly carboplatin and Taxol, with slight dose reduction on Taxol, 2 weeks on, one-week off -I reviewed his CT scan findings on 09/26/2016 show good partial response to chemoradiation, no new or progressive metastatic disease.   2. CAD, diastolic CHF, on continuous oxygen -He will continue follow-up with his cardiologist Dr. Radford Pax -His Plavix has been held since the EGD and biopsy, he is on baby aspirin. -Need to watch his fluid intake during his chemotherapy, and also avoid fluid overload from chemo  -he is back on lasix, dsypnea improved   3. DM, HTN -We discussed his blood pressure and glucose need to be monitored closely during the chemotherapy, which may affect his blood glucose and blood pressure -He will continue medication, monitor his sugar and blood pressure at home, and follow-up with his primary care physician -will decrease premed dexa before taxol  -History simple has been held due to his borderline low blood pressure -He will see his primary care physician Dr. Anitra Lauth for diabetes  management -High blood glucose today at 168.   4. OSA, morbid obesity -We discussed healthy diet, I encouraged him to to be more physically active, and consider home PT -I strongly encouraged him to follow-up with dietitian during the therapy for nutrition support.  5. Epigastric pain -Likely secondary to his underlying malignancy -worse after radiation, resolved now  -continue tramadol as need  6. Arthralgia -He has baseline osteoarthritis, not physically active -However his arthralgia in his knees and ankle are much worse after chemotherapy -He will use oxycodone 5 mg as needed, constipation management reviewed with him -Leg weakness improved with physical therapy. Encouraged the patient to continue with physical therapy.   7. Anemia -Second is to underlying malignancy and chemotherapy -Hemoglobin is 10.6 today -we'll consider blood transfusion if hemoglobin less than 8, for symptomatic anemia  8. Code status  -Her discussed the goal of care, which is palliative. -Patient is very realistic, understands his cancer is not curable, and he agrees with DO NOT RESUSCITATE and DO NOT INTUBATE  9. Vein access -He has had issues with his veins being accessed -I spoke with the patient about having a port placed. He is agreeable to having a port placed.   Plan -He will receive chemo cycle 5 treatment today. His Taxol dose was reduced to 16m/m2 -He will have a port placed. Interventional radiology will schedule him for this. -Return to clinic in 1 week with lab and chemo (weekly carbo and taxol) appointment  All questions were answered. The patient knows to call the clinic with any problems, questions or concerns. I spent 30 minutes counseling the patient face to face. The total time spent in the appointment was 35 minutes and more than 35% was on counseling.  This document serves as a record of services personally performed by YTruitt Merle MD. It was created on her behalf by EArlyce Harman a trained medical scribe. The creation of this record is based on the scribe's personal observations and the provider's statements to them. This document has been checked and approved by the attending provider.     FTruitt Merle MD 09/28/2016

## 2016-09-28 ENCOUNTER — Other Ambulatory Visit (HOSPITAL_BASED_OUTPATIENT_CLINIC_OR_DEPARTMENT_OTHER): Payer: Medicare Other

## 2016-09-28 ENCOUNTER — Ambulatory Visit (HOSPITAL_BASED_OUTPATIENT_CLINIC_OR_DEPARTMENT_OTHER): Payer: Medicare Other

## 2016-09-28 ENCOUNTER — Telehealth: Payer: Self-pay | Admitting: Hematology

## 2016-09-28 ENCOUNTER — Ambulatory Visit (HOSPITAL_BASED_OUTPATIENT_CLINIC_OR_DEPARTMENT_OTHER): Payer: Medicare Other | Admitting: Hematology

## 2016-09-28 ENCOUNTER — Encounter: Payer: Self-pay | Admitting: Hematology

## 2016-09-28 VITALS — BP 126/65 | HR 110 | Temp 99.9°F | Resp 18 | Ht 69.0 in | Wt 266.3 lb

## 2016-09-28 DIAGNOSIS — Z5111 Encounter for antineoplastic chemotherapy: Secondary | ICD-10-CM | POA: Diagnosis not present

## 2016-09-28 DIAGNOSIS — C160 Malignant neoplasm of cardia: Secondary | ICD-10-CM | POA: Diagnosis not present

## 2016-09-28 DIAGNOSIS — E119 Type 2 diabetes mellitus without complications: Secondary | ICD-10-CM | POA: Diagnosis not present

## 2016-09-28 DIAGNOSIS — I10 Essential (primary) hypertension: Secondary | ICD-10-CM

## 2016-09-28 DIAGNOSIS — D630 Anemia in neoplastic disease: Secondary | ICD-10-CM

## 2016-09-28 DIAGNOSIS — I5032 Chronic diastolic (congestive) heart failure: Secondary | ICD-10-CM

## 2016-09-28 LAB — CBC WITH DIFFERENTIAL/PLATELET
BASO%: 0.8 % (ref 0.0–2.0)
BASOS ABS: 0 10*3/uL (ref 0.0–0.1)
EOS ABS: 0.1 10*3/uL (ref 0.0–0.5)
EOS%: 2.7 % (ref 0.0–7.0)
HCT: 33.7 % — ABNORMAL LOW (ref 38.4–49.9)
HEMOGLOBIN: 10.6 g/dL — AB (ref 13.0–17.1)
LYMPH%: 12.7 % — AB (ref 14.0–49.0)
MCH: 28 pg (ref 27.2–33.4)
MCHC: 31.6 g/dL — ABNORMAL LOW (ref 32.0–36.0)
MCV: 88.6 fL (ref 79.3–98.0)
MONO#: 0.8 10*3/uL (ref 0.1–0.9)
MONO%: 15.8 % — AB (ref 0.0–14.0)
NEUT#: 3.5 10*3/uL (ref 1.5–6.5)
NEUT%: 68 % (ref 39.0–75.0)
PLATELETS: 170 10*3/uL (ref 140–400)
RBC: 3.8 10*6/uL — AB (ref 4.20–5.82)
RDW: 20.1 % — ABNORMAL HIGH (ref 11.0–14.6)
WBC: 5.2 10*3/uL (ref 4.0–10.3)
lymph#: 0.7 10*3/uL — ABNORMAL LOW (ref 0.9–3.3)

## 2016-09-28 LAB — COMPREHENSIVE METABOLIC PANEL
ALBUMIN: 2.8 g/dL — AB (ref 3.5–5.0)
ALK PHOS: 100 U/L (ref 40–150)
ALT: 22 U/L (ref 0–55)
ANION GAP: 11 meq/L (ref 3–11)
AST: 27 U/L (ref 5–34)
BILIRUBIN TOTAL: 0.73 mg/dL (ref 0.20–1.20)
BUN: 7.8 mg/dL (ref 7.0–26.0)
CO2: 24 mEq/L (ref 22–29)
Calcium: 9.1 mg/dL (ref 8.4–10.4)
Chloride: 103 mEq/L (ref 98–109)
Creatinine: 0.8 mg/dL (ref 0.7–1.3)
GLUCOSE: 168 mg/dL — AB (ref 70–140)
POTASSIUM: 4.1 meq/L (ref 3.5–5.1)
SODIUM: 138 meq/L (ref 136–145)
Total Protein: 6.6 g/dL (ref 6.4–8.3)

## 2016-09-28 MED ORDER — DIPHENHYDRAMINE HCL 50 MG/ML IJ SOLN
INTRAMUSCULAR | Status: AC
  Filled 2016-09-28: qty 1

## 2016-09-28 MED ORDER — FAMOTIDINE IN NACL 20-0.9 MG/50ML-% IV SOLN
INTRAVENOUS | Status: AC
  Filled 2016-09-28: qty 50

## 2016-09-28 MED ORDER — SODIUM CHLORIDE 0.9 % IV SOLN
70.0000 mg/m2 | Freq: Once | INTRAVENOUS | Status: AC
  Administered 2016-09-28: 180 mg via INTRAVENOUS
  Filled 2016-09-28: qty 30

## 2016-09-28 MED ORDER — DEXAMETHASONE SODIUM PHOSPHATE 10 MG/ML IJ SOLN
INTRAMUSCULAR | Status: AC
  Filled 2016-09-28: qty 1

## 2016-09-28 MED ORDER — SODIUM CHLORIDE 0.9 % IV SOLN
Freq: Once | INTRAVENOUS | Status: AC
  Administered 2016-09-28: 11:00:00 via INTRAVENOUS

## 2016-09-28 MED ORDER — DEXAMETHASONE SODIUM PHOSPHATE 10 MG/ML IJ SOLN
10.0000 mg | Freq: Once | INTRAMUSCULAR | Status: AC
  Administered 2016-09-28: 10 mg via INTRAVENOUS

## 2016-09-28 MED ORDER — PALONOSETRON HCL INJECTION 0.25 MG/5ML
0.2500 mg | Freq: Once | INTRAVENOUS | Status: AC
  Administered 2016-09-28: 0.25 mg via INTRAVENOUS

## 2016-09-28 MED ORDER — DIPHENHYDRAMINE HCL 50 MG/ML IJ SOLN
25.0000 mg | Freq: Once | INTRAMUSCULAR | Status: AC
Start: 2016-09-28 — End: 2016-09-28
  Administered 2016-09-28: 25 mg via INTRAVENOUS

## 2016-09-28 MED ORDER — SODIUM CHLORIDE 0.9 % IV SOLN
300.0000 mg | Freq: Once | INTRAVENOUS | Status: AC
  Administered 2016-09-28: 300 mg via INTRAVENOUS
  Filled 2016-09-28: qty 30

## 2016-09-28 MED ORDER — FAMOTIDINE IN NACL 20-0.9 MG/50ML-% IV SOLN
20.0000 mg | Freq: Once | INTRAVENOUS | Status: AC
  Administered 2016-09-28: 20 mg via INTRAVENOUS

## 2016-09-28 MED ORDER — PALONOSETRON HCL INJECTION 0.25 MG/5ML
INTRAVENOUS | Status: AC
Start: 2016-09-28 — End: 2016-09-28
  Filled 2016-09-28: qty 5

## 2016-09-28 NOTE — Patient Instructions (Signed)
[facility] Discharge Instructions for Patients Receiving Chemotherapy  Today you received the following chemotherapy agents Taxol/Carboplatin  To help prevent nausea and vomiting after your treatment, we encourage you to take your nausea medication    If you develop nausea and vomiting that is not controlled by your nausea medication, call the clinic.   BELOW ARE SYMPTOMS THAT SHOULD BE REPORTED IMMEDIATELY:  *FEVER GREATER THAN 100.5 F  *CHILLS WITH OR WITHOUT FEVER  NAUSEA AND VOMITING THAT IS NOT CONTROLLED WITH YOUR NAUSEA MEDICATION  *UNUSUAL SHORTNESS OF BREATH  *UNUSUAL BRUISING OR BLEEDING  TENDERNESS IN MOUTH AND THROAT WITH OR WITHOUT PRESENCE OF ULCERS  *URINARY PROBLEMS  *BOWEL PROBLEMS  UNUSUAL RASH Items with * indicate a potential emergency and should be followed up as soon as possible.  Feel free to call the clinic you have any questions or concerns. The clinic phone number is [phone].  Please show the CHEMO ALERT CARD at check-in to the Emergency Department and triage nurse.   

## 2016-09-28 NOTE — Telephone Encounter (Signed)
Message sent to chemo scheduler to be added, per 09/28/16 los. Port placement scheduled for Wednesday, 10/03/16 @ 12:30 pm. N.P.O. After 8 a.m. Patient's daughter, Heinz Knuckles is aware. Appointments scheduled per 09/28/16 los. A copy of the AVS report and appointment schedule was given to patient's daughter, per 09/28/16 los.

## 2016-09-30 ENCOUNTER — Encounter: Payer: Self-pay | Admitting: Family Medicine

## 2016-10-01 ENCOUNTER — Telehealth: Payer: Self-pay | Admitting: *Deleted

## 2016-10-01 DIAGNOSIS — Z7984 Long term (current) use of oral hypoglycemic drugs: Secondary | ICD-10-CM | POA: Diagnosis not present

## 2016-10-01 DIAGNOSIS — E119 Type 2 diabetes mellitus without complications: Secondary | ICD-10-CM | POA: Diagnosis not present

## 2016-10-01 DIAGNOSIS — C160 Malignant neoplasm of cardia: Secondary | ICD-10-CM | POA: Diagnosis not present

## 2016-10-01 DIAGNOSIS — Z7982 Long term (current) use of aspirin: Secondary | ICD-10-CM | POA: Diagnosis not present

## 2016-10-01 DIAGNOSIS — E440 Moderate protein-calorie malnutrition: Secondary | ICD-10-CM | POA: Diagnosis not present

## 2016-10-01 NOTE — Telephone Encounter (Signed)
Per LOS I have scheduled appts and notified the scheduler 

## 2016-10-02 ENCOUNTER — Ambulatory Visit: Payer: Medicare Other

## 2016-10-02 ENCOUNTER — Other Ambulatory Visit: Payer: Medicare Other

## 2016-10-02 ENCOUNTER — Other Ambulatory Visit: Payer: Self-pay | Admitting: Radiology

## 2016-10-02 ENCOUNTER — Other Ambulatory Visit: Payer: Self-pay | Admitting: Student

## 2016-10-02 ENCOUNTER — Ambulatory Visit: Payer: Medicare Other | Admitting: Hematology

## 2016-10-03 ENCOUNTER — Ambulatory Visit (HOSPITAL_COMMUNITY)
Admission: RE | Admit: 2016-10-03 | Discharge: 2016-10-03 | Disposition: A | Discharge status: Home / Self Care | Payer: Medicare Other | Source: Ambulatory Visit | Attending: Hematology | Admitting: Hematology

## 2016-10-03 ENCOUNTER — Other Ambulatory Visit: Payer: Self-pay | Admitting: Hematology

## 2016-10-03 ENCOUNTER — Encounter (HOSPITAL_COMMUNITY): Payer: Self-pay

## 2016-10-03 DIAGNOSIS — I130 Hypertensive heart and chronic kidney disease with heart failure and stage 1 through stage 4 chronic kidney disease, or unspecified chronic kidney disease: Secondary | ICD-10-CM | POA: Diagnosis not present

## 2016-10-03 DIAGNOSIS — G4733 Obstructive sleep apnea (adult) (pediatric): Secondary | ICD-10-CM | POA: Diagnosis not present

## 2016-10-03 DIAGNOSIS — Z87891 Personal history of nicotine dependence: Secondary | ICD-10-CM | POA: Diagnosis not present

## 2016-10-03 DIAGNOSIS — I252 Old myocardial infarction: Secondary | ICD-10-CM | POA: Diagnosis not present

## 2016-10-03 DIAGNOSIS — Z9981 Dependence on supplemental oxygen: Secondary | ICD-10-CM | POA: Diagnosis not present

## 2016-10-03 DIAGNOSIS — C160 Malignant neoplasm of cardia: Secondary | ICD-10-CM | POA: Diagnosis not present

## 2016-10-03 DIAGNOSIS — I2510 Atherosclerotic heart disease of native coronary artery without angina pectoris: Secondary | ICD-10-CM | POA: Insufficient documentation

## 2016-10-03 DIAGNOSIS — N183 Chronic kidney disease, stage 3 (moderate): Secondary | ICD-10-CM | POA: Diagnosis not present

## 2016-10-03 DIAGNOSIS — Z7984 Long term (current) use of oral hypoglycemic drugs: Secondary | ICD-10-CM | POA: Diagnosis not present

## 2016-10-03 DIAGNOSIS — I5032 Chronic diastolic (congestive) heart failure: Secondary | ICD-10-CM | POA: Diagnosis not present

## 2016-10-03 DIAGNOSIS — Z6839 Body mass index (BMI) 39.0-39.9, adult: Secondary | ICD-10-CM | POA: Insufficient documentation

## 2016-10-03 DIAGNOSIS — E1122 Type 2 diabetes mellitus with diabetic chronic kidney disease: Secondary | ICD-10-CM | POA: Diagnosis not present

## 2016-10-03 DIAGNOSIS — C159 Malignant neoplasm of esophagus, unspecified: Secondary | ICD-10-CM | POA: Diagnosis not present

## 2016-10-03 DIAGNOSIS — C155 Malignant neoplasm of lower third of esophagus: Secondary | ICD-10-CM | POA: Diagnosis not present

## 2016-10-03 DIAGNOSIS — J449 Chronic obstructive pulmonary disease, unspecified: Secondary | ICD-10-CM | POA: Insufficient documentation

## 2016-10-03 DIAGNOSIS — E119 Type 2 diabetes mellitus without complications: Secondary | ICD-10-CM | POA: Diagnosis not present

## 2016-10-03 DIAGNOSIS — E669 Obesity, unspecified: Secondary | ICD-10-CM | POA: Insufficient documentation

## 2016-10-03 DIAGNOSIS — Z5111 Encounter for antineoplastic chemotherapy: Secondary | ICD-10-CM | POA: Diagnosis not present

## 2016-10-03 DIAGNOSIS — Z79899 Other long term (current) drug therapy: Secondary | ICD-10-CM | POA: Insufficient documentation

## 2016-10-03 DIAGNOSIS — Z7982 Long term (current) use of aspirin: Secondary | ICD-10-CM | POA: Insufficient documentation

## 2016-10-03 DIAGNOSIS — E785 Hyperlipidemia, unspecified: Secondary | ICD-10-CM | POA: Diagnosis not present

## 2016-10-03 DIAGNOSIS — E440 Moderate protein-calorie malnutrition: Secondary | ICD-10-CM | POA: Diagnosis not present

## 2016-10-03 HISTORY — PX: IR GENERIC HISTORICAL: IMG1180011

## 2016-10-03 LAB — CBC WITH DIFFERENTIAL/PLATELET
Basophils Absolute: 0 10*3/uL (ref 0.0–0.1)
Basophils Relative: 0 %
EOS ABS: 0.2 10*3/uL (ref 0.0–0.7)
Eosinophils Relative: 5 %
HCT: 35.2 % — ABNORMAL LOW (ref 39.0–52.0)
HEMOGLOBIN: 11 g/dL — AB (ref 13.0–17.0)
LYMPHS ABS: 0.8 10*3/uL (ref 0.7–4.0)
LYMPHS PCT: 17 %
MCH: 28.3 pg (ref 26.0–34.0)
MCHC: 31.3 g/dL (ref 30.0–36.0)
MCV: 90.5 fL (ref 78.0–100.0)
Monocytes Absolute: 0.3 10*3/uL (ref 0.1–1.0)
Monocytes Relative: 6 %
NEUTROS ABS: 3.5 10*3/uL (ref 1.7–7.7)
NEUTROS PCT: 72 %
Platelets: 184 10*3/uL (ref 150–400)
RBC: 3.89 MIL/uL — AB (ref 4.22–5.81)
RDW: 17.7 % — ABNORMAL HIGH (ref 11.5–15.5)
WBC: 4.9 10*3/uL (ref 4.0–10.5)

## 2016-10-03 LAB — GLUCOSE, CAPILLARY: Glucose-Capillary: 131 mg/dL — ABNORMAL HIGH (ref 65–99)

## 2016-10-03 LAB — PROTIME-INR
INR: 1.01
PROTHROMBIN TIME: 13.3 s (ref 11.4–15.2)

## 2016-10-03 LAB — APTT: aPTT: 31 seconds (ref 24–36)

## 2016-10-03 MED ORDER — NALOXONE HCL 0.4 MG/ML IJ SOLN
INTRAMUSCULAR | Status: AC
  Filled 2016-10-03: qty 1

## 2016-10-03 MED ORDER — LIDOCAINE-EPINEPHRINE (PF) 2 %-1:200000 IJ SOLN
INTRAMUSCULAR | Status: AC | PRN
  Administered 2016-10-03: 10 mL via INTRADERMAL

## 2016-10-03 MED ORDER — HEPARIN SOD (PORK) LOCK FLUSH 100 UNIT/ML IV SOLN
INTRAVENOUS | Status: AC
  Filled 2016-10-03: qty 5

## 2016-10-03 MED ORDER — FENTANYL CITRATE (PF) 100 MCG/2ML IJ SOLN
INTRAMUSCULAR | Status: AC
  Filled 2016-10-03: qty 4

## 2016-10-03 MED ORDER — FENTANYL CITRATE (PF) 100 MCG/2ML IJ SOLN
INTRAMUSCULAR | Status: AC | PRN
  Administered 2016-10-03 (×3): 50 ug via INTRAVENOUS

## 2016-10-03 MED ORDER — MIDAZOLAM HCL 2 MG/2ML IJ SOLN
INTRAMUSCULAR | Status: AC | PRN
  Administered 2016-10-03 (×4): 1 mg via INTRAVENOUS

## 2016-10-03 MED ORDER — SODIUM CHLORIDE 0.9 % IV SOLN
INTRAVENOUS | Status: DC
  Administered 2016-10-03: 13:00:00 via INTRAVENOUS

## 2016-10-03 MED ORDER — LIDOCAINE HCL 1 % IJ SOLN
INTRAMUSCULAR | Status: AC | PRN
  Administered 2016-10-03: 10 mL via INTRADERMAL

## 2016-10-03 MED ORDER — LIDOCAINE HCL 1 % IJ SOLN
INTRAMUSCULAR | Status: AC
  Filled 2016-10-03: qty 20

## 2016-10-03 MED ORDER — CEFAZOLIN SODIUM-DEXTROSE 2-4 GM/100ML-% IV SOLN
2.0000 g | INTRAVENOUS | Status: AC
  Administered 2016-10-03: 2 g via INTRAVENOUS
  Filled 2016-10-03: qty 100

## 2016-10-03 MED ORDER — FLUMAZENIL 0.5 MG/5ML IV SOLN
INTRAVENOUS | Status: AC
  Filled 2016-10-03: qty 5

## 2016-10-03 MED ORDER — LIDOCAINE-EPINEPHRINE (PF) 2 %-1:200000 IJ SOLN
INTRAMUSCULAR | Status: AC
  Filled 2016-10-03: qty 20

## 2016-10-03 MED ORDER — MIDAZOLAM HCL 2 MG/2ML IJ SOLN
INTRAMUSCULAR | Status: AC
  Filled 2016-10-03: qty 4

## 2016-10-03 NOTE — H&P (Signed)
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 Truitt Merle  Supervising Physician: Daryll Brod  Patient Status: [facility] - Out-pt  History of Present Illness: [name] is a 70 y.o. 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  Patient has been on chemo for several months and recently had more difficulty with vein access.  IR consulted by Dr. Burr Medico for Port-A-Cath placement due to ongoing chemotherapy and need for access.  Patient is not on blood thinners. He has been NPO as of last night.   Past Medical History:  Diagnosis Date  . Asthma    as a child  . Cataract    multiple types, bilateral  . Chronic diastolic CHF (congestive heart failure) ([place]) 02/25/2009  . Chronic renal insufficiency, stage III (moderate) 2017   Stage II/III (GFR around 60)  . 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 COPD (chronic obstructive pulmonary disease) ([place])   . Coronary artery disease    chronically occluded LAD and diagonal with right to left collaterals, mild disease in the left circ and moderate disease in the mid RCA on medical management with Imdur, ASA, and Plavix.  . Dermatitis 05/2014  . DIABETES MELLITUS, TYPE II 06/24/2007   No diab retpthy as of 08/05/15 eye exam.  . 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 Pyrtle--EGD).  CT C/A/P showed metastatic adenopathy in mediastinum and upper abdomen 05/09/16.  Tx plan is palliative radiation (completed 06/22/16), then palliative systemic chemotherapy (carbo+taxol) was started but as of 08/03/16 onc f/u this was held due to severe knee and ankle arthralgias.  Restarting as of 09/2016 (08/2016 CT showed some disease regression  . Essential hypertension 05/01/2007   Qualifier:  Diagnosis of  By: Tiney Rouge CMA, Ellison Hughs     . History of kidney stones   . Hyperkalemia 12/2015   Decreased ACE-I by 50% in response, then potassium normalized.  Marland Kitchen HYPERLIPIDEMIA 06/24/2007  . HYPERTENSION 05/01/2007  . Hypoxemia 01/27/2010  . Myocardial infarction    pt states he was informed per MD that he has had one but pt was unaware   . OBESITY 09/02/2008  . Obesity hypoventilation syndrome (HCC)    oxygen 24/7 (2 liters [place] as of 02/2016)  . On home oxygen therapy    Oxygen @ 2l/m nasally 24/7 hours  . OSA (obstructive sleep apnea)    not tested; pt scored 4 per stop bang tool results sent to PCP   . OSTEOARTHRITIS 05/01/2007  . Presbycusis of both ears 05/2015   [facility]  . Pruritic condition 05/2014   Allergist summer 2015, no new testing.  [name] field defect 05/10/16   Per Dr. Melina Fiddler, O.D.: Rt, loss of inf/temp quad and some loss sup/temp.  ?CVA  ? Pituitary tumor? ?Brain met.    Past Surgical History:  Procedure Laterality Date  . APPENDECTOMY    . BALLOON DILATION N/A 05/04/2016   Procedure: BALLOON DILATION;  Surgeon: Jerene Bears, MD;  Location: [facility];  Service: Gastroenterology;  Laterality: N/A;  . CARDIAC CATHETERIZATION    . CARPAL TUNNEL RELEASE Left   . CATARACT EXTRACTION W/PHACO Right 04/29/2013   Procedure: CATARACT EXTRACTION PHACO AND INTRAOCULAR LENS PLACEMENT (IOC);  Surgeon:  Adonis Brook, MD;  Location: [facility];  Service: Ophthalmology;  Laterality: Right;  . CATARACT EXTRACTION, BILATERAL    . COLONOSCOPY W/ POLYPECTOMY  08/2011   Many polyps--all hyperplastic, severe diverticulosis, int hem.  [employer] hemoccult testing via [facility] 06/22/15 NEG  . ESOPHAGOGASTRODUODENOSCOPY N/A 05/04/2016   Procedure: ESOPHAGOGASTRODUODENOSCOPY (EGD);  Surgeon: Jerene Bears, MD;  Location: [facility];  Service: Gastroenterology;  Laterality: N/A;  . KNEE ARTHROSCOPY WITH MEDIAL MENISECTOMY Right 12/16/2014   Procedure: RIGHT KNEE ARTHROSCOPY WITH MEDIAL MENISECTOMY microfracture  medial femoral condyle abrasion condroplasty medial femoral condyle lateral menisectomy;  Surgeon: Tobi Bastos, MD;  Location: [facility];  Service: Orthopedics;  Laterality: Right;  . LEFT HEART CATHETERIZATION WITH CORONARY ANGIOGRAM N/A 10/26/2013   Procedure: LEFT HEART CATHETERIZATION WITH CORONARY ANGIOGRAM;  Surgeon: Jettie Booze, MD;  Location: [facility] CATH LAB;  Service: Cardiovascular;  Laterality: N/A;  . LUMBAR [place] SURGERY  2001  . SHOULDER SURGERY Right    right fx  . TONSILLECTOMY    . TRANSTHORACIC ECHOCARDIOGRAM  03/2016   EF 55-60%, grade I DD, LAE  . WRIST SURGERY Right    right fx    Allergies: Patient has no known allergies.  Medications: Prior to Admission medications   Medication Sig Start Date End Date Taking? Authorizing Provider  aspirin 81 MG tablet Take 81 mg by mouth daily.     Yes Historical Provider, MD  atorvastatin (LIPITOR) 80 MG tablet Take 1 tablet (80 mg total) by mouth daily. 01/31/16  Yes Sueanne Margarita, MD  beta carotene w/minerals (OCUVITE) tablet Take 1 tablet by mouth daily.   Yes Historical Provider, MD  cetirizine (ZYRTEC) 10 MG tablet Take 10 mg by mouth daily.   Yes Historical Provider, MD  citalopram (CELEXA) 20 MG tablet Take 1 tablet (20 mg total) by mouth daily. 01/04/16  Yes Tammi Sou, MD  clonazePAM (KLONOPIN) 1 MG tablet TAKE ONE TABLET BY MOUTH TWICE DAILY AS NEEDED FOR ANXIETY 07/04/16  Yes Tammi Sou, MD  cromolyn (OPTICROM) 4 % ophthalmic solution Place 1 drop into both eyes 4 (four) times daily. 02/09/16  Yes Historical Provider, MD  ezetimibe (ZETIA) 10 MG tablet Take 1 tablet (10 mg total) by mouth daily. 10/17/15  Yes Sueanne Margarita, MD  gabapentin (NEURONTIN) 300 MG capsule TAKE ONE CAPSULE BY MOUTH THREE TIMES DAILY 09/03/16  Yes Tammi Sou, MD  hydrOXYzine (ATARAX/VISTARIL) 25 MG tablet 1 tab at supper and 1 at bedtime (for insomnia) 11/09/15  Yes Tammi Sou, MD  magnesium chloride (SLOW-MAG) 64 MG TBEC SR  tablet Take 1 tablet (64 mg total) by mouth daily. 09/01/16  Yes Eber Jones, MD  metFORMIN (GLUCOPHAGE) 1000 MG tablet TAKE ONE TABLET BY MOUTH TWICE DAILY WITH MEALS 08/28/16  Yes Tammi Sou, MD  Omega-3 Fatty Acids ([employer] FISH OIL) 1000 MG CAPS Take 2 tablets by mouth 2 (two) times daily. 10/18/14  Yes Sueanne Margarita, MD  oxyCODONE (OXY IR/ROXICODONE) 5 MG immediate release tablet Take 1 tablet (5 mg total) by mouth every 6 (six) hours as needed for severe pain. 09/07/16  Yes Truitt Merle, MD  OXYGEN Inhale 2 L/min into the lungs continuous.   Yes Historical Provider, MD  pantoprazole (PROTONIX) 40 MG tablet TAKE ONE TABLET BY MOUTH TWICE DAILY 08/27/16  Yes Jerene Bears, MD  ondansetron (ZOFRAN) 8 MG tablet Take 1 tablet (8 mg total) by mouth 2 (two) times daily as needed for refractory  nausea / vomiting. Start on day 3 after chemo. 07/06/16   Truitt Merle, MD  prochlorperazine (COMPAZINE) 10 MG tablet Take 1 tablet (10 mg total) by mouth every 6 (six) hours as needed (Nausea or vomiting). 07/06/16   Truitt Merle, MD     Family History  Problem Relation Age of Onset  . Heart attack Mother   . Aneurysm Father   . Alcohol abuse Father   . Diabetes Maternal Aunt   . Colon cancer Neg Hx     Social History   Social History  . Marital status: Married    Spouse name: susan  . Number of children: 2  . Years of education: N/A   Occupational History  . Retired    Social History Main Topics  . Smoking status: Former Smoker    Packs/day: 1.50    Years: 30.00    Types: Cigarettes, Pipe, Cigars    Quit date: 10/30/1983  . Smokeless tobacco: Former Systems developer    Types: Chew    Quit date: 05/15/2016  . Alcohol use No  . Drug use: No  . Sexual activity: Not Currently   Other Topics Concern  . None   Social History Narrative   Married, one son and one daughter.   His daughter and her two children live with him.   Coffee daily.  Former smoker.  No alcohol.   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   Attends church weekly-   Oxygen continuous        Review of Systems  Constitutional: Negative for appetite change.  Respiratory: Negative for cough and shortness of breath.   Cardiovascular: Negative for chest pain.  Gastrointestinal: Negative for abdominal pain.  Psychiatric/Behavioral: Negative for behavioral problems and confusion.    Vital Signs: BP 137/73   Pulse 95   Temp 98.2 F (36.8 C) (Oral)   Resp 18   Ht '5\' 9"'  (1.753 m)   Wt 266 lb 4.8 oz (120.8 kg)   SpO2 100%   BMI 39.33 kg/m   Physical Exam  Constitutional: He is oriented to person, place, and time. He appears well-developed.  Cardiovascular: Normal rate, regular rhythm and normal heart sounds.   Pulmonary/Chest: Effort normal and breath sounds normal. No respiratory distress.  Abdominal: Soft. There is no tenderness.  Neurological: He is alert and oriented to person, place, and time.  Skin: Skin is warm and dry.  Psychiatric: He has a normal mood and affect. His behavior is normal. Judgment and thought content normal.  Nursing note and vitals reviewed.   Mallampati Score:  MD Evaluation Airway: WNL Heart: WNL Abdomen: WNL Chest/ Lungs: WNL ASA  Classification: 3 Mallampati/Airway Score: Two  Imaging: Ct Chest W Contrast  Result Date: 09/26/2016 CLINICAL DATA:  Followup esophagogastric junction adenocarcinoma. Ongoing chemotherapy. Restaging. EXAM: CT CHEST, ABDOMEN, AND PELVIS WITH CONTRAST TECHNIQUE: Multidetector CT imaging of the chest, abdomen and pelvis was performed following the standard protocol during bolus administration of intravenous contrast. CONTRAST:  141m ISOVUE-300 IOPAMIDOL (ISOVUE-300) INJECTION 61% COMPARISON:  05/09/2016 FINDINGS: CT CHEST FINDINGS Cardiovascular: No acute findings. Three-vessel coronary artery calcification. Aortic atherosclerosis. Mediastinum/Lymph Nodes: Right paraesophageal lymphadenopathy in the inferior mediastinum has  resolved since previous study. Subcarinal lymph node measures 9 mm on image 28/2 compared to 10 mm previously. High right paratracheal lymph node measures 7 mm on image 11/2 compared to 9 mm previously. No new or increased lymphadenopathy identified. Lungs/Pleura: No pulmonary infiltrate or mass identified. No  effusion present. Stable mild bilateral lower lobe scarring. Musculoskeletal: No suspicious bone lesions or other significant abnormality. CT ABDOMEN AND PELVIS FINDINGS Hepatobiliary: Stable sub-cm low-attenuation lesion in the liver dome on image 36/2 is too small to characterize. No other liver masses identified. Enlarged caudate lobe and capsular nodularity, suspicious for hepatic cirrhosis. Tiny calcified gallstones again seen, without evidence cholecystitis or biliary ductal dilatation. Pancreas:  No mass or inflammatory changes. Spleen:  Within normal limits in size and appearance. Adrenals/Urinary tract: No masses or hydronephrosis. Stable small bilateral renal cysts. Stomach/Bowel: Masslike soft tissue prominence at the gastroesophageal junction has decreased since previous study. No evidence of obstruction, inflammatory process, or abnormal fluid collections. Sigmoid diverticulosis again demonstrated, without evidence of diverticulitis. Vascular/Lymphatic: No pathologically enlarged lymph nodes identified. There has been resolution of lymphadenopathy in the gastrohepatic ligament since prior study. No new lymphadenopathy identified. No abdominal aortic aneurysm.  Aortic atherosclerosis. Reproductive:  No mass or other significant abnormality identified. Other:  None. Musculoskeletal:  No suspicious bone lesions identified. IMPRESSION: Decreased masslike soft tissue prominence at gastroesophageal junction, consistent with decreased size of primary gastroesophageal junction carcinoma. Resolution of paraesophageal and gastrohepatic ligament lymphadenopathy since prior exam. Other sub-cm mediastinal  lymph nodes show little or no significant change. Stable indeterminate sub-cm low-attenuation lesion in the liver dome. Probable hepatic cirrhosis. Recommend continued attention on follow-up CT. No new or progressive metastatic disease identified. Cholelithiasis.  No radiographic evidence of cholecystitis. Colonic diverticulosis. No radiographic evidence of diverticulitis. Aortic atherosclerosis and three-vessel coronary artery calcification. Electronically Signed   By: Earle Gell M.D.   On: 09/26/2016 16:35   Ct Abdomen Pelvis W Contrast  Result Date: 09/26/2016 CLINICAL DATA:  Followup esophagogastric junction adenocarcinoma. Ongoing chemotherapy. Restaging. EXAM: CT CHEST, ABDOMEN, AND PELVIS WITH CONTRAST TECHNIQUE: Multidetector CT imaging of the chest, abdomen and pelvis was performed following the standard protocol during bolus administration of intravenous contrast. CONTRAST:  15m ISOVUE-300 IOPAMIDOL (ISOVUE-300) INJECTION 61% COMPARISON:  05/09/2016 FINDINGS: CT CHEST FINDINGS Cardiovascular: No acute findings. Three-vessel coronary artery calcification. Aortic atherosclerosis. Mediastinum/Lymph Nodes: Right paraesophageal lymphadenopathy in the inferior mediastinum has resolved since previous study. Subcarinal lymph node measures 9 mm on image 28/2 compared to 10 mm previously. High right paratracheal lymph node measures 7 mm on image 11/2 compared to 9 mm previously. No new or increased lymphadenopathy identified. Lungs/Pleura: No pulmonary infiltrate or mass identified. No effusion present. Stable mild bilateral lower lobe scarring. Musculoskeletal: No suspicious bone lesions or other significant abnormality. CT ABDOMEN AND PELVIS FINDINGS Hepatobiliary: Stable sub-cm low-attenuation lesion in the liver dome on image 36/2 is too small to characterize. No other liver masses identified. Enlarged caudate lobe and capsular nodularity, suspicious for hepatic cirrhosis. Tiny calcified gallstones again  seen, without evidence cholecystitis or biliary ductal dilatation. Pancreas:  No mass or inflammatory changes. Spleen:  Within normal limits in size and appearance. Adrenals/Urinary tract: No masses or hydronephrosis. Stable small bilateral renal cysts. Stomach/Bowel: Masslike soft tissue prominence at the gastroesophageal junction has decreased since previous study. No evidence of obstruction, inflammatory process, or abnormal fluid collections. Sigmoid diverticulosis again demonstrated, without evidence of diverticulitis. Vascular/Lymphatic: No pathologically enlarged lymph nodes identified. There has been resolution of lymphadenopathy in the gastrohepatic ligament since prior study. No new lymphadenopathy identified. No abdominal aortic aneurysm.  Aortic atherosclerosis. Reproductive:  No mass or other significant abnormality identified. Other:  None. Musculoskeletal:  No suspicious bone lesions identified. IMPRESSION: Decreased masslike soft tissue prominence at gastroesophageal junction, consistent with decreased size of primary  gastroesophageal junction carcinoma. Resolution of paraesophageal and gastrohepatic ligament lymphadenopathy since prior exam. Other sub-cm mediastinal lymph nodes show little or no significant change. Stable indeterminate sub-cm low-attenuation lesion in the liver dome. Probable hepatic cirrhosis. Recommend continued attention on follow-up CT. No new or progressive metastatic disease identified. Cholelithiasis.  No radiographic evidence of cholecystitis. Colonic diverticulosis. No radiographic evidence of diverticulitis. Aortic atherosclerosis and three-vessel coronary artery calcification. Electronically Signed   By: Earle Gell M.D.   On: 09/26/2016 16:35    Labs:  CBC:  Recent Labs  09/07/16 1433 09/14/16 1025 09/28/16 0859 10/03/16 1234  WBC 4.7 4.8 5.2 4.9  HGB 8.3* 10.3* 10.6* 11.0*  HCT 26.1* 31.4* 33.7* 35.2*  PLT 137* 422.0* 170 184    COAGS:  Recent  Labs  08/28/16 2040 10/03/16 1234  INR 0.95 1.01  APTT  --  31    BMP:  Recent Labs  03/16/16 0431  08/29/16 0316 08/30/16 0325 08/31/16 0730 09/07/16 1433 09/14/16 1025 09/28/16 0859  NA 137  < > 138 141 139 130* 135 138  K 4.9  < > 3.4* 3.3* 3.9 5.1 5.5* 4.1  CL 98*  < > 107 108 105  --  101  --   CO2 31  < > '24 27 30 22 25 24  ' GLUCOSE 96  < > 109* 89 123* 167* 134* 168*  BUN 17  < > 28* 12 5* 9.6 11 7.8  CALCIUM 9.4  < > 7.2* 7.7* 8.2* 8.6 9.0 9.1  CREATININE 1.04  < > 1.55* 0.89 0.82 0.9 0.79 0.8  GFRNONAA >60  --  43* >60 >60  --   --   --   GFRAA >60  --  50* >60 >60  --   --   --   < > = values in this interval not displayed.  LIVER FUNCTION TESTS:  Recent Labs  08/28/16 1450 08/30/16 0325 09/07/16 1433 09/28/16 0859  BILITOT 0.59 0.7 0.79 0.73  AST 33 35 65* 27  ALT 30 27 51 22  ALKPHOS 68 52 146 100  PROT 6.4 5.5* 6.7 6.6  ALBUMIN 2.7* 2.6* 2.4* 2.8*    TUMOR MARKERS: No results for input(s): AFPTM, CEA, CA199, CHROMGRNA in the last 8760 hours.  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  Recently he has had more difficulty accessing peripheral veins.  IR consulted for Port-A-Cath placement.  Patient scheduled for procedure today.  Risks and benefits discussed with the patient including, but not limited to bleeding, infection, or fibrin sheath development and need for additional procedures. All of the patient's questions were answered, patient is agreeable to proceed. Consent signed and in chart.   Thank you for this interesting consult.  I greatly enjoyed meeting [name] and look forward to participating in their care.  A copy of this report was sent to the requesting provider on this date.  Electronically Signed: Docia Barrier 10/03/2016, 1:25 PM   I spent a total of  30 Minutes   in  face to face in clinical consultation, greater than 50% of which was counseling/coordinating care for GE junction cancer

## 2016-10-03 NOTE — Procedures (Signed)
Esophageal cancer  S/p RT IJ POWER PORT NO COMP STABLE TIP SVC/RA READY FOR USE FULL REPORT IN PACS

## 2016-10-03 NOTE — Discharge Instructions (Signed)
Implanted Port Home Guide °An implanted port is a type of central line that is placed under the skin. Central lines are used to provide IV access when treatment or nutrition needs to be given through a person's veins. Implanted ports are used for long-term IV access. An implanted port may be placed because:  °· You need IV medicine that would be irritating to the small veins in your hands or arms.   °· You need long-term IV medicines, such as antibiotics.   °· You need IV nutrition for a long period.   °· You need frequent blood draws for lab tests.   °· You need dialysis.   °Implanted ports are usually placed in the chest area, but they can also be placed in the upper arm, the abdomen, or the leg. An implanted port has two main parts:  °· Reservoir. The reservoir is round and will appear as a small, raised area under your skin. The reservoir is the part where a needle is inserted to give medicines or draw blood.   °· Catheter. The catheter is a thin, flexible tube that extends from the reservoir. The catheter is placed into a large vein. Medicine that is inserted into the reservoir goes into the catheter and then into the vein.   °HOW WILL I CARE FOR MY INCISION SITE? °Do not get the incision site wet. Bathe or shower as directed by your health care provider.  °HOW IS MY PORT ACCESSED? °Special steps must be taken to access the port:  °· Before the port is accessed, a numbing cream can be placed on the skin. This helps numb the skin over the port site.   °· Your health care provider uses a sterile technique to access the port. °¨ Your health care provider must put on a mask and sterile gloves. °¨ The skin over your port is cleaned carefully with an antiseptic and allowed to dry. °¨ The port is gently pinched between sterile gloves, and a needle is inserted into the port. °· Only "non-coring" port needles should be used to access the port. Once the port is accessed, a blood return should be checked. This helps  ensure that the port is in the vein and is not clogged.   °· If your port needs to remain accessed for a constant infusion, a clear (transparent) bandage will be placed over the needle site. The bandage and needle will need to be changed every week, or as directed by your health care provider.   °· Keep the bandage covering the needle clean and dry. Do not get it wet. Follow your health care provider's instructions on how to take a shower or bath while the port is accessed.   °· If your port does not need to stay accessed, no bandage is needed over the port.   °WHAT IS FLUSHING? °Flushing helps keep the port from getting clogged. Follow your health care provider's instructions on how and when to flush the port. Ports are usually flushed with saline solution or a medicine called heparin. The need for flushing will depend on how the port is used.  °· If the port is used for intermittent medicines or blood draws, the port will need to be flushed:   °¨ After medicines have been given.   °¨ After blood has been drawn.   °¨ As part of routine maintenance.   °· If a constant infusion is running, the port may not need to be flushed.   °HOW LONG WILL MY PORT STAY IMPLANTED? °The port can stay in for as long as your health care   provider thinks it is needed. When it is time for the port to come out, surgery will be done to remove it. The procedure is similar to the one performed when the port was put in.  °WHEN SHOULD I SEEK IMMEDIATE MEDICAL CARE? °When you have an implanted port, you should seek immediate medical care if:  °· You notice a bad smell coming from the incision site.   °· You have swelling, redness, or drainage at the incision site.   °· You have more swelling or pain at the port site or the surrounding area.   °· You have a fever that is not controlled with medicine. °This information is not intended to replace advice given to you by your health care provider. Make sure you discuss any questions you have with  your health care provider. °Document Released: 10/15/2005 Document Revised: 08/05/2013 Document Reviewed: 06/22/2013 °Elsevier Interactive Patient Education © 2017 [employer] °Implanted Port Insertion, Care After °Refer to this sheet in the next few weeks. These instructions provide you with information on caring for yourself after your procedure. Your health care provider may also give you more specific instructions. Your treatment has been planned according to current medical practices, but problems sometimes occur. Call your health care provider if you have any problems or questions after your procedure. °WHAT TO EXPECT AFTER THE PROCEDURE °After your procedure, it is typical to have the following:  °· Discomfort at the port insertion site. Ice packs to the area will help. °· Bruising on the skin over the port. This will subside in 3-4 days. °HOME CARE INSTRUCTIONS °· After your port is placed, you will get a manufacturer's information card. The card has information about your port. Keep this card with you at all times.   °· Know what kind of port you have. There are many types of ports available.   °· Wear a medical alert bracelet in case of an emergency. This can help alert health care workers that you have a port.   °· The port can stay in for as long as your health care provider believes it is necessary.   °· A home health care nurse may give medicines and take care of the port.   °· You or a family member can get special training and directions for giving medicine and taking care of the port at home.   °SEEK MEDICAL CARE IF:  °· Your port does not flush or you are unable to get a blood return.   °· You have a fever or chills. °SEEK IMMEDIATE MEDICAL CARE IF: °· You have new fluid or pus coming from your incision.   °· You notice a bad smell coming from your incision site.   °· You have swelling, pain, or more redness at the incision or port site.   °· You have chest pain or shortness of breath. °This  information is not intended to replace advice given to you by your health care provider. Make sure you discuss any questions you have with your health care provider. °Document Released: 08/05/2013 Document Revised: 10/20/2013 Document Reviewed: 08/05/2013 °Elsevier Interactive Patient Education © 2017 [employer] °Moderate Conscious Sedation, Adult, Care After °These instructions provide you with information about caring for yourself after your procedure. Your health care provider may also give you more specific instructions. Your treatment has been planned according to current medical practices, but problems sometimes occur. Call your health care provider if you have any problems or questions after your procedure. °What can I expect after the procedure? °After your procedure, it is common: °·   To feel sleepy for several hours. °· To feel clumsy and have poor balance for several hours. °· To have poor judgment for several hours. °· To vomit if you eat too soon. °Follow these instructions at home: °For at least 24 hours after the procedure:  °· Do not: °¨ Participate in activities where you could fall or become injured. °¨ Drive. °¨ Use heavy machinery. °¨ Drink alcohol. °¨ Take sleeping pills or medicines that cause drowsiness. °¨ Make important decisions or sign legal documents. °¨ Take care of children on your own. °· Rest. °Eating and drinking °· Follow the diet recommended by your health care provider. °· If you vomit: °¨ Drink water, juice, or soup when you can drink without vomiting. °¨ Make sure you have little or no nausea before eating solid foods. °General instructions °· Have a responsible adult stay with you until you are awake and alert. °· Take over-the-counter and prescription medicines only as told by your health care provider. °· If you smoke, do not smoke without supervision. °· Keep all follow-up visits as told by your health care provider. This is important. °Contact a health care provider  if: °· You keep feeling nauseous or you keep vomiting. °· You feel light-headed. °· You develop a rash. °· You have a fever. °Get help right away if: °· You have trouble breathing. °This information is not intended to replace advice given to you by your health care provider. Make sure you discuss any questions you have with your health care provider. °Document Released: 08/05/2013 Document Revised: 03/19/2016 Document Reviewed: 02/04/2016 °Elsevier Interactive Patient Education © 2017 [employer] ° °

## 2016-10-05 ENCOUNTER — Other Ambulatory Visit (HOSPITAL_BASED_OUTPATIENT_CLINIC_OR_DEPARTMENT_OTHER): Payer: Medicare Other

## 2016-10-05 ENCOUNTER — Ambulatory Visit (HOSPITAL_BASED_OUTPATIENT_CLINIC_OR_DEPARTMENT_OTHER): Payer: Medicare Other

## 2016-10-05 ENCOUNTER — Telehealth: Payer: Self-pay | Admitting: Hematology

## 2016-10-05 ENCOUNTER — Ambulatory Visit (HOSPITAL_BASED_OUTPATIENT_CLINIC_OR_DEPARTMENT_OTHER): Payer: Medicare Other | Admitting: Nurse Practitioner

## 2016-10-05 ENCOUNTER — Ambulatory Visit: Payer: Medicare Other

## 2016-10-05 VITALS — BP 128/71 | HR 98 | Temp 98.4°F | Resp 16 | Ht 69.0 in | Wt 265.0 lb

## 2016-10-05 DIAGNOSIS — C160 Malignant neoplasm of cardia: Secondary | ICD-10-CM

## 2016-10-05 DIAGNOSIS — E119 Type 2 diabetes mellitus without complications: Secondary | ICD-10-CM

## 2016-10-05 DIAGNOSIS — I10 Essential (primary) hypertension: Secondary | ICD-10-CM

## 2016-10-05 DIAGNOSIS — Z5111 Encounter for antineoplastic chemotherapy: Secondary | ICD-10-CM | POA: Diagnosis not present

## 2016-10-05 DIAGNOSIS — Z95828 Presence of other vascular implants and grafts: Secondary | ICD-10-CM | POA: Insufficient documentation

## 2016-10-05 LAB — COMPREHENSIVE METABOLIC PANEL
ALT: 25 U/L (ref 0–55)
ANION GAP: 11 meq/L (ref 3–11)
AST: 27 U/L (ref 5–34)
Albumin: 2.9 g/dL — ABNORMAL LOW (ref 3.5–5.0)
Alkaline Phosphatase: 90 U/L (ref 40–150)
BILIRUBIN TOTAL: 0.61 mg/dL (ref 0.20–1.20)
BUN: 7.9 mg/dL (ref 7.0–26.0)
CHLORIDE: 101 meq/L (ref 98–109)
CO2: 26 meq/L (ref 22–29)
CREATININE: 0.8 mg/dL (ref 0.7–1.3)
Calcium: 9 mg/dL (ref 8.4–10.4)
EGFR: >90 mL/min/{1.73_m2} (ref >90)
Glucose: 128 mg/dl (ref 70–140)
Potassium: 4.1 mEq/L (ref 3.5–5.1)
Sodium: 138 mEq/L (ref 136–145)
TOTAL PROTEIN: 6.5 g/dL (ref 6.4–8.3)

## 2016-10-05 LAB — CBC WITH DIFFERENTIAL/PLATELET
BASO%: 0.4 % (ref 0.0–2.0)
Basophils Absolute: 0 10*3/uL (ref 0.0–0.1)
EOS ABS: 0.2 10*3/uL (ref 0.0–0.5)
EOS%: 3.5 % (ref 0.0–7.0)
HCT: 32.5 % — ABNORMAL LOW (ref 38.4–49.9)
HGB: 10.2 g/dL — ABNORMAL LOW (ref 13.0–17.1)
LYMPH%: 14.9 % (ref 14.0–49.0)
MCH: 28.3 pg (ref 27.2–33.4)
MCHC: 31.4 g/dL — AB (ref 32.0–36.0)
MCV: 90.3 fL (ref 79.3–98.0)
MONO#: 0.4 10*3/uL (ref 0.1–0.9)
MONO%: 7.3 % (ref 0.0–14.0)
NEUT%: 73.9 % (ref 39.0–75.0)
NEUTROS ABS: 3.6 10*3/uL (ref 1.5–6.5)
NRBC: 0 % (ref 0–0)
PLATELETS: 148 10*3/uL (ref 140–400)
RBC: 3.6 10*6/uL — AB (ref 4.20–5.82)
RDW: 17.6 % — AB (ref 11.0–14.6)
WBC: 4.9 10*3/uL (ref 4.0–10.3)
lymph#: 0.7 10*3/uL — ABNORMAL LOW (ref 0.9–3.3)

## 2016-10-05 MED ORDER — DEXAMETHASONE SODIUM PHOSPHATE 10 MG/ML IJ SOLN
INTRAMUSCULAR | Status: AC
  Filled 2016-10-05: qty 1

## 2016-10-05 MED ORDER — SODIUM CHLORIDE 0.9% FLUSH
10.0000 mL | INTRAVENOUS | Status: DC | PRN
  Administered 2016-10-05: 10 mL
  Filled 2016-10-05: qty 10

## 2016-10-05 MED ORDER — SODIUM CHLORIDE 0.9 % IV SOLN
Freq: Once | INTRAVENOUS | Status: AC
  Administered 2016-10-05: 12:00:00 via INTRAVENOUS

## 2016-10-05 MED ORDER — DIPHENHYDRAMINE HCL 50 MG/ML IJ SOLN
INTRAMUSCULAR | Status: AC
  Filled 2016-10-05: qty 1

## 2016-10-05 MED ORDER — FAMOTIDINE IN NACL 20-0.9 MG/50ML-% IV SOLN
20.0000 mg | Freq: Once | INTRAVENOUS | Status: AC
  Administered 2016-10-05: 20 mg via INTRAVENOUS

## 2016-10-05 MED ORDER — DEXAMETHASONE SODIUM PHOSPHATE 10 MG/ML IJ SOLN
10.0000 mg | Freq: Once | INTRAMUSCULAR | Status: AC
  Administered 2016-10-05: 10 mg via INTRAVENOUS

## 2016-10-05 MED ORDER — SODIUM CHLORIDE 0.9 % IV SOLN
70.0000 mg/m2 | Freq: Once | INTRAVENOUS | Status: AC
  Administered 2016-10-05: 180 mg via INTRAVENOUS
  Filled 2016-10-05: qty 30

## 2016-10-05 MED ORDER — PALONOSETRON HCL INJECTION 0.25 MG/5ML
INTRAVENOUS | Status: AC
  Filled 2016-10-05: qty 5

## 2016-10-05 MED ORDER — PALONOSETRON HCL INJECTION 0.25 MG/5ML
0.2500 mg | Freq: Once | INTRAVENOUS | Status: AC
  Administered 2016-10-05: 0.25 mg via INTRAVENOUS

## 2016-10-05 MED ORDER — SODIUM CHLORIDE 0.9% FLUSH
10.0000 mL | INTRAVENOUS | Status: DC | PRN
  Administered 2016-10-05: 10 mL via INTRAVENOUS
  Filled 2016-10-05: qty 10

## 2016-10-05 MED ORDER — DIPHENHYDRAMINE HCL 50 MG/ML IJ SOLN
25.0000 mg | Freq: Once | INTRAMUSCULAR | Status: AC
  Administered 2016-10-05: 25 mg via INTRAVENOUS

## 2016-10-05 MED ORDER — FAMOTIDINE IN NACL 20-0.9 MG/50ML-% IV SOLN
INTRAVENOUS | Status: AC
  Filled 2016-10-05: qty 50

## 2016-10-05 MED ORDER — SODIUM CHLORIDE 0.9 % IV SOLN
300.0000 mg | Freq: Once | INTRAVENOUS | Status: AC
  Administered 2016-10-05: 300 mg via INTRAVENOUS
  Filled 2016-10-05: qty 30

## 2016-10-05 MED ORDER — HEPARIN SOD (PORK) LOCK FLUSH 100 UNIT/ML IV SOLN
500.0000 [IU] | Freq: Once | INTRAVENOUS | Status: AC | PRN
  Administered 2016-10-05: 500 [IU]
  Filled 2016-10-05: qty 5

## 2016-10-05 NOTE — Patient Instructions (Signed)
[facility] Discharge Instructions for Patients Receiving Chemotherapy  Today you received the following chemotherapy agents :  Taxol,  Carboplatin.  To help prevent nausea and vomiting after your treatment, we encourage you to take your nausea medication as prescribed.   If you develop nausea and vomiting that is not controlled by your nausea medication, call the clinic.   BELOW ARE SYMPTOMS THAT SHOULD BE REPORTED IMMEDIATELY:  *FEVER GREATER THAN 100.5 F  *CHILLS WITH OR WITHOUT FEVER  NAUSEA AND VOMITING THAT IS NOT CONTROLLED WITH YOUR NAUSEA MEDICATION  *UNUSUAL SHORTNESS OF BREATH  *UNUSUAL BRUISING OR BLEEDING  TENDERNESS IN MOUTH AND THROAT WITH OR WITHOUT PRESENCE OF ULCERS  *URINARY PROBLEMS  *BOWEL PROBLEMS  UNUSUAL RASH Items with * indicate a potential emergency and should be followed up as soon as possible.  Feel free to call the clinic you have any questions or concerns. The clinic phone number is [phone].  Please show the CHEMO ALERT CARD at check-in to the Emergency Department and triage nurse.   

## 2016-10-05 NOTE — Addendum Note (Signed)
Addended by: Truitt Merle on: 10/05/2016 11:22 AM   Modules accepted: Orders

## 2016-10-05 NOTE — Telephone Encounter (Signed)
Message sent to chemo scheduler to be added per 10/05/16 los. Appointments scheduled per 10/05/16 los. A copy of the AVS report and appointment schedule was given to the patient, per 10/05/16 los.

## 2016-10-05 NOTE — Progress Notes (Signed)
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 The mass was not obstructing and partially circumferential, extends approximately 5 cm. Nonbleeding erosive gastropathy.     05/04/2016 Initial Biopsy    Esophageal gastric junction biopsy showed a poorly differentiated carcinoma underlying the squamous mucosa. There is lymphovascular invasion. No Intestinal metaplasia. IHC weakly positive CK5/6, p63 (-), favor squamous.       05/09/2016 Imaging    CT CAP w contrast showed mild wall thickening involving the distal esophagus and proximal stomach compatible with known cancer, enlarged mediastinal and upper abdominal lymph nodes are highly suspicious for metastatic adenopathy, propable liver cirrhosis.     06/04/2016 - 06/22/2016 Radiation Therapy    palliative radiation to esophageal cancer      07/06/2016 -  Chemotherapy    Weekly carbo and taxol     07/30/2016 Pathology Results    PD-L1 negative expression     08/28/2016 - 08/31/2016 Hospital Admission    The patient was admitted to 4 severe hypoglycemia, dehydration, and acute renal failure. Infection workup was negative. He recovered well with supportive care. His insulin and hypertension medication was held on discharge, except insulin sliding scale.    INTERVAL HISTORY:   [name] returns as scheduled. He completed weekly carboplatin/Taxol 09/28/2016. He denies nausea/vomiting. No mouth sores. No  diarrhea or constipation. He periodically notes numbness in the fingertips. He reports blood sugars are doing "well". Appetite varies. No significant dysphagia or odynophagia. He denies pain. He notes improvement in activity level. He is working with physical therapy. He continues to have difficulty with steps. His family thinks he is overall improving.  Objective:  Vital signs in last 24 hours:  Blood pressure 128/71, pulse 98, temperature 98.4 F (36.9 C), temperature source Oral, resp. rate 16, height '5\' 9"'  (1.753 m), weight 265 lb (120.2 kg), SpO2 98 %.    HEENT: No thrush or ulcers. Resp: Lungs clear bilaterally. Cardio: Regular rate and rhythm. GI: Abdomen soft and nontender. No hepatomegaly. Vascular: No leg edema. Neuro: Alert and oriented.  Port-A-Cath without erythema.    Lab Results:  Lab Results  Component Value Date   WBC 4.9 10/05/2016   HGB 10.2 (L) 10/05/2016   HCT 32.5 (L) 10/05/2016   MCV 90.3 10/05/2016   PLT 148 10/05/2016   NEUTROABS 3.6 10/05/2016    Imaging:  Ir US Guide Vasc Access Right  Result Date: 10/03/2016 CLINICAL DATA:  ESOPHAGEAL CANCER, ACCESS FOR CHEMOTHERAPY EXAM: RIGHT INTERNAL JUGULAR SINGLE LUMEN POWER PORT CATHETER INSERTION Date:  12/6/201712/03/2016 4:03 pm Radiologist:  M. Daryll Brod, MD Guidance:  Ultrasound and fluoroscopic MEDICATIONS: 2 g Ancef; The antibiotic was administered within an appropriate time interval prior to skin puncture. ANESTHESIA/SEDATION: Versed 4.0 mg IV; Fentanyl 150 mcg IV; Moderate Sedation Time:  27 minutes The patient was continuously monitored during the procedure by the interventional radiology nurse under my direct supervision. FLUOROSCOPY TIME:  One minutes, 12 seconds (34  mGy) COMPLICATIONS: None immediate. CONTRAST:  None. PROCEDURE: Informed consent was obtained from the patient following explanation of the procedure, risks, benefits and alternatives. The patient understands, agrees and consents for the  procedure. All questions were addressed. A time out was performed. Maximal barrier sterile technique utilized including caps, mask, sterile gowns, sterile gloves, large sterile drape, hand hygiene, and 2% chlorhexidine scrub. Under sterile conditions and local anesthesia, right internal jugular micropuncture venous access was performed. Access was performed with ultrasound. Images were obtained for documentation. A guide wire was inserted followed by a transitional dilator. This allowed insertion of a guide wire and catheter into the IVC. Measurements were obtained from the SVC / RA junction back to the right IJ venotomy site. In the right infraclavicular chest, a subcutaneous pocket was created over the second anterior rib. This was done under sterile conditions and local anesthesia. 1% lidocaine with epinephrine was utilized for this. A 2.5 cm incision was made in the skin. Blunt dissection was performed to create a subcutaneous pocket over the right pectoralis major muscle. The pocket was flushed with saline vigorously. There was adequate hemostasis. The port catheter was assembled and checked for leakage. The port catheter was secured in the pocket with two retention sutures. The tubing was tunneled subcutaneously to the right venotomy site and inserted into the SVC/RA junction through a valved peel-away sheath. Position was confirmed with fluoroscopy. Images were obtained for documentation. The patient tolerated the procedure well. No immediate complications. Incisions were closed in a two layer fashion with 4 - 0 Vicryl suture. Dermabond was applied to the skin. The port catheter was accessed, blood was aspirated followed by saline and heparin flushes. Needle was removed. A dry sterile dressing was applied. IMPRESSION: Ultrasound and fluoroscopically guided right internal jugular single lumen power port catheter insertion. Tip in the SVC/RA junction. Catheter ready for use. Electronically Signed   By: Jerilynn Mages.   Shick M.D.   On: 10/03/2016 16:34   Ir Fluoro Guide Port Insertion Right  Result Date: 10/03/2016 CLINICAL DATA:  ESOPHAGEAL CANCER, ACCESS FOR CHEMOTHERAPY EXAM: RIGHT INTERNAL JUGULAR SINGLE LUMEN POWER PORT CATHETER INSERTION Date:  12/6/201712/03/2016 4:03 pm Radiologist:  M. Daryll Brod, MD Guidance:  Ultrasound and fluoroscopic MEDICATIONS: 2 g Ancef; The antibiotic was administered within an appropriate time interval prior to skin puncture. ANESTHESIA/SEDATION: Versed 4.0 mg IV; Fentanyl 150 mcg IV; Moderate Sedation Time:  27 minutes The patient was continuously monitored during the procedure by the interventional radiology nurse under my direct supervision. FLUOROSCOPY TIME:  One minutes, 12 seconds (34 mGy) COMPLICATIONS: None immediate. CONTRAST:  None. PROCEDURE: Informed consent was obtained from the patient following explanation of the procedure, risks, benefits and alternatives. The patient understands, agrees and consents for the procedure. All questions were addressed. A time out was performed. Maximal barrier sterile technique utilized including caps, mask, sterile gowns, sterile gloves, large sterile drape, hand hygiene, and 2% chlorhexidine scrub. Under sterile conditions and local anesthesia, right internal jugular micropuncture venous access was performed. Access was performed with ultrasound. Images were obtained for documentation. A guide wire was inserted followed by a transitional dilator. This allowed insertion of a guide wire and catheter into the IVC. Measurements were obtained from the SVC / RA junction back to the right IJ venotomy site. In the right infraclavicular chest, a subcutaneous pocket was created over the second anterior rib. This was done under sterile conditions and local anesthesia. 1% lidocaine with epinephrine was utilized for this. A 2.5 cm incision was  made in the skin. Blunt dissection was performed to create a subcutaneous pocket over the right pectoralis major  muscle. The pocket was flushed with saline vigorously. There was adequate hemostasis. The port catheter was assembled and checked for leakage. The port catheter was secured in the pocket with two retention sutures. The tubing was tunneled subcutaneously to the right venotomy site and inserted into the SVC/RA junction through a valved peel-away sheath. Position was confirmed with fluoroscopy. Images were obtained for documentation. The patient tolerated the procedure well. No immediate complications. Incisions were closed in a two layer fashion with 4 - 0 Vicryl suture. Dermabond was applied to the skin. The port catheter was accessed, blood was aspirated followed by saline and heparin flushes. Needle was removed. A dry sterile dressing was applied. IMPRESSION: Ultrasound and fluoroscopically guided right internal jugular single lumen power port catheter insertion. Tip in the SVC/RA junction. Catheter ready for use. Electronically Signed   By: Jerilynn Mages.  Shick M.D.   On: 10/03/2016 16:34    Medications: I have reviewed the patient's current medications.  Assessment/Plan: 1. 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 2. CAD, diastolic CHF, on continuous oxygen followed by Dr. Radford Pax. 3. Diabetes mellitus 4. Hypertension 5. OSA, morbid obesity 6. Epigastric pain likely secondary to #1 7. Arthralgias, increased following chemotherapy 8. Hospitalization with severe hypoglycemia, dehydration, acute renal failure 08/28/2016. 9. Port-A-Cath placement in interventional radiology 10/03/2016.   Disposition: [name] appears stable.  Plan to proceed with weekly carboplatin/Taxol today as scheduled. We mutually agreed that he would be off of treatment 10/12/2016 and 10/19/2016 and return to begin the next cycle on 10/26/2016. He will be seen in follow-up to proceeding with treatment on 10/26/2016. He will contact the office in the interim with any problems.  Plan reviewed with Dr. Burr Medico.     Ned Card ANP/GNP-BC   10/05/2016  10:09 AM

## 2016-10-06 ENCOUNTER — Other Ambulatory Visit: Payer: Self-pay | Admitting: Cardiology

## 2016-10-07 DIAGNOSIS — J449 Chronic obstructive pulmonary disease, unspecified: Secondary | ICD-10-CM | POA: Diagnosis not present

## 2016-10-08 DIAGNOSIS — E440 Moderate protein-calorie malnutrition: Secondary | ICD-10-CM | POA: Diagnosis not present

## 2016-10-08 DIAGNOSIS — Z7982 Long term (current) use of aspirin: Secondary | ICD-10-CM | POA: Diagnosis not present

## 2016-10-08 DIAGNOSIS — E119 Type 2 diabetes mellitus without complications: Secondary | ICD-10-CM | POA: Diagnosis not present

## 2016-10-08 DIAGNOSIS — Z7984 Long term (current) use of oral hypoglycemic drugs: Secondary | ICD-10-CM | POA: Diagnosis not present

## 2016-10-08 DIAGNOSIS — C160 Malignant neoplasm of cardia: Secondary | ICD-10-CM | POA: Diagnosis not present

## 2016-10-09 ENCOUNTER — Telehealth: Payer: Self-pay | Admitting: *Deleted

## 2016-10-09 NOTE — Telephone Encounter (Signed)
Per LOS I have scheduled appts and notified the scheduler 

## 2016-10-10 DIAGNOSIS — Z7984 Long term (current) use of oral hypoglycemic drugs: Secondary | ICD-10-CM | POA: Diagnosis not present

## 2016-10-10 DIAGNOSIS — E440 Moderate protein-calorie malnutrition: Secondary | ICD-10-CM | POA: Diagnosis not present

## 2016-10-10 DIAGNOSIS — C160 Malignant neoplasm of cardia: Secondary | ICD-10-CM | POA: Diagnosis not present

## 2016-10-10 DIAGNOSIS — E119 Type 2 diabetes mellitus without complications: Secondary | ICD-10-CM | POA: Diagnosis not present

## 2016-10-10 DIAGNOSIS — Z7982 Long term (current) use of aspirin: Secondary | ICD-10-CM | POA: Diagnosis not present

## 2016-10-15 ENCOUNTER — Telehealth: Payer: Self-pay | Admitting: *Deleted

## 2016-10-15 DIAGNOSIS — E440 Moderate protein-calorie malnutrition: Secondary | ICD-10-CM | POA: Diagnosis not present

## 2016-10-15 DIAGNOSIS — Z7984 Long term (current) use of oral hypoglycemic drugs: Secondary | ICD-10-CM | POA: Diagnosis not present

## 2016-10-15 DIAGNOSIS — Z7982 Long term (current) use of aspirin: Secondary | ICD-10-CM | POA: Diagnosis not present

## 2016-10-15 DIAGNOSIS — E119 Type 2 diabetes mellitus without complications: Secondary | ICD-10-CM | POA: Diagnosis not present

## 2016-10-15 DIAGNOSIS — C160 Malignant neoplasm of cardia: Secondary | ICD-10-CM | POA: Diagnosis not present

## 2016-10-15 NOTE — Telephone Encounter (Signed)
[name] w/ [facility] PT [facility] on 10/15/16 at 1:17pm stating that pt had another fall at home in his kitchen. She stated that pt was standing on a loose rug and the rung slipped out from under pt when he bent over to get a bowl. She stated that pt fell on his bottom, has some bruising but no medical attention needed. She stated that the rug has been replaced and that other safety adjustments had been made to the outside steps.

## 2016-10-15 NOTE — Telephone Encounter (Signed)
Noted  

## 2016-10-16 ENCOUNTER — Ambulatory Visit: Payer: Medicare Other | Admitting: Cardiology

## 2016-10-17 DIAGNOSIS — E440 Moderate protein-calorie malnutrition: Secondary | ICD-10-CM | POA: Diagnosis not present

## 2016-10-17 DIAGNOSIS — E119 Type 2 diabetes mellitus without complications: Secondary | ICD-10-CM | POA: Diagnosis not present

## 2016-10-17 DIAGNOSIS — Z7982 Long term (current) use of aspirin: Secondary | ICD-10-CM | POA: Diagnosis not present

## 2016-10-17 DIAGNOSIS — C160 Malignant neoplasm of cardia: Secondary | ICD-10-CM | POA: Diagnosis not present

## 2016-10-17 DIAGNOSIS — Z7984 Long term (current) use of oral hypoglycemic drugs: Secondary | ICD-10-CM | POA: Diagnosis not present

## 2016-10-18 ENCOUNTER — Encounter: Payer: Self-pay | Admitting: Family Medicine

## 2016-10-23 ENCOUNTER — Other Ambulatory Visit: Payer: Medicare Other

## 2016-10-23 ENCOUNTER — Ambulatory Visit: Payer: Medicare Other | Admitting: Hematology

## 2016-10-23 ENCOUNTER — Ambulatory Visit: Payer: Medicare Other

## 2016-10-24 NOTE — Progress Notes (Signed)
[place]  [phone] [phone]  Clinic Follow up Note   Patient Care Team: Tammi Sou, MD as PCP - General (Family Medicine) Ulyses Southward, MD as Consulting Physician (Allergy and Immunology) Calton Dach, MD as Consulting Physician (Optometry) Sueanne Margarita, MD as Consulting Physician (Cardiology) Jerene Bears, MD as Consulting Physician (Gastroenterology) Latanya Maudlin, MD as Consulting Physician (Orthopedic Surgery) Jodi Marble, MD as Consulting Physician (Otolaryngology) Truitt Merle, MD as Consulting Physician (Hematology) Kyung Rudd, MD as Consulting Physician (Radiation Oncology) 11/02/2016    CHIEF COMPLAINTS:  Follow up GE junction cancer  Oncology History   Cancer of cardio-esophageal junction [facility])   Staging form: Stomach, AJCC 7th Edition     Clinical stage from 05/04/2016: Stage IV ([place], N2, M1) - Signed by Truitt Merle, MD on 05/18/2016        Cancer of cardio-esophageal junction ([place])   05/04/2016 Initial Diagnosis    Cancer of cardio-esophageal junction ([place])      05/04/2016 Procedure    EGD showed a large ulcerating mass with no active bleeding at the gastroesophageal junction extending into the gastric cardia. The mass was not obstructing and partially circumferential, extends approximately 5 cm. Nonbleeding erosive gastropathy.      05/04/2016 Initial Biopsy    Esophageal gastric junction biopsy showed a poorly differentiated carcinoma underlying the squamous mucosa. There is lymphovascular invasion. No Intestinal metaplasia. IHC weakly positive CK5/6, p63 (-), favor squamous.        05/09/2016 Imaging    CT CAP w contrast showed mild wall thickening involving the distal esophagus and proximal stomach compatible with known cancer, enlarged mediastinal and upper abdominal lymph nodes are highly suspicious for metastatic adenopathy, propable liver cirrhosis.      06/04/2016 - 06/22/2016 Radiation Therapy   palliative radiation to esophageal cancer       07/06/2016 -  Chemotherapy    Weekly carbo and taxol      07/30/2016 Pathology Results    PD-L1 negative expression      08/28/2016 - 08/31/2016 Hospital Admission    The patient was admitted to 4 severe hypoglycemia, dehydration, and acute renal failure. Infection workup was negative. He recovered well with supportive care. His insulin and hypertension medication was held on discharge, except insulin sliding scale.      09/24/2016 Imaging    CT CAP W CONTRAST 09/26/2016 IMPRESSION: Decreased masslike soft tissue prominence at gastroesophageal junction, consistent with decreased size of primary gastroesophageal junction carcinoma. Resolution of paraesophageal and gastrohepatic ligament lymphadenopathy since prior exam. Other sub-cm mediastinal lymph nodes show little or no significant change. Stable indeterminate sub-cm low-attenuation lesion in the liver dome. Probable hepatic cirrhosis. Recommend continued attention on follow-up CT. No new or progressive metastatic disease identified. Cholelithiasis.  No radiographic evidence of cholecystitis. Colonic diverticulosis. No radiographic evidence of diverticulitis. Aortic atherosclerosis and three-vessel coronary artery calcification.       GE junction carcinoma ([place])    HISTORY OF PRESENTING ILLNESS:  [name] 70 y.o. male is here because of His reason that diagnosed GE junction carcinoma. He is a comment of by his wife and daughter to our multidisciplinary chart clinic today.  He has been having progressive dysphagia and odynophagia for 2 month, he has been eating soft diet only in the past one week. He has pain in the mid chest and epigastric area only when he eats, with burning sensation, no nausea, abdominal bloating, or other discomfort. His appetite has remained  well, no weight loss,   He has multiple medical problems, especially heart failure, he has been on oxygen  continuously for 5-6 years. He is morbidly obese, has diabetes, hypertension, mild neuropathy, OSA etc. He has very sedentary life style, he sits in the chair and watch TV most of time during the day. He only comes out for shopping for a few times a week. He uses a Barr[employer], he can walk for a few hundrends feet before he has to stop to catch his breasts. He denies cough or sputum production, no GI discomfort, he has been having mild dirrhea, loose stool, twice daily, no melena or hematochezia.  CURRENT THERAPY: chemotherapy with weekly carboplatin and taxol,  started on 07/06/2016, held 08/21/16-09/28/2016 due to hospitalization, changed to 2 weeks on and 2 weeks off after that   INTERIM HISTORY: [name] returns for follow-up. Accompanied by his daughter-in-law. He presents in wheelchair with nasal cannula in place. He has been doing well and feels well. Reports constipation 2 weeks ago and then diarrhea 1 week ago. Denies nausea. He has some ankle pain. Uses a walker at home. Eats less than he used to, but he reports he still eats. Weight is stable. Denies difficulty swallowing.  MEDICAL HISTORY:  Past Medical History:  Diagnosis Date  . Asthma    as a child  . Cataract    multiple types, bilateral  . Chronic diastolic CHF (congestive heart failure) ([place]) 02/25/2009  . Chronic renal insufficiency, stage III (moderate) 2017   Stage II/III (GFR around 60)  . 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 COPD (chronic obstructive pulmonary disease) ([place])   . Coronary artery disease    chronically occluded LAD and diagonal with right to left collaterals, mild disease in the left circ and moderate disease in the mid RCA on medical management with Imdur, ASA, and Plavix.  . Dermatitis 05/2014  . DIABETES MELLITUS, TYPE II 06/24/2007   No diab retpthy as of 08/05/15 eye exam.  . Esophageal cancer ([place]) 04/2016   poorly differentiated  carcinoma (Dr. Pyrtle--EGD).  CT C/A/P showed metastatic adenopathy in mediastinum and upper abdomen 05/09/16.  Tx plan is palliative radiation (completed 06/22/16), then palliative systemic chemotherapy (carbo+taxol) was started but as of 08/03/16 onc f/u this was held due to severe knee and ankle arthralgias.  Restarting as of 09/2016 (08/2016 CT showed some disease regression  . Essential hypertension 05/01/2007   Qualifier: Diagnosis of  By: Tiney Rouge CMA, Ellison Hughs     . History of kidney stones   . Hyperkalemia 12/2015   Decreased ACE-I by 50% in response, then potassium normalized.  Marland Kitchen HYPERLIPIDEMIA 06/24/2007  . HYPERTENSION 05/01/2007  . Hypoxemia 01/27/2010  . Myocardial infarction    pt states he was informed per MD that he has had one but pt was unaware   . OBESITY 09/02/2008  . Obesity hypoventilation syndrome (HCC)    oxygen 24/7 (2 liters [place] as of 02/2016)  . On home oxygen therapy    Oxygen @ 2l/m nasally 24/7 hours  . OSA (obstructive sleep apnea)    not tested; pt scored 4 per stop bang tool results sent to PCP   . OSTEOARTHRITIS 05/01/2007  . Presbycusis of both ears 05/2015   [facility]  . Pruritic condition 05/2014   Allergist summer 2015, no new testing.  [name] field defect 05/10/16   Per Dr. Melina Fiddler, O.D.:  Rt, loss of inf/temp quad and some loss sup/temp.  ?CVA  ? Pituitary tumor? ?Brain met.    SURGICAL HISTORY: Past Surgical History:  Procedure Laterality Date  . APPENDECTOMY    . BALLOON DILATION N/A 05/04/2016   Procedure: BALLOON DILATION;  Surgeon: Jerene Bears, MD;  Location: [facility];  Service: Gastroenterology;  Laterality: N/A;  . CARDIAC CATHETERIZATION    . CARPAL TUNNEL RELEASE Left   . CATARACT EXTRACTION W/PHACO Right 04/29/2013   Procedure: CATARACT EXTRACTION PHACO AND INTRAOCULAR LENS PLACEMENT (IOC);  Surgeon: Adonis Brook, MD;  Location: [facility];  Service: Ophthalmology;  Laterality: Right;  . CATARACT EXTRACTION, BILATERAL    . COLONOSCOPY W/ POLYPECTOMY   08/2011   Many polyps--all hyperplastic, severe diverticulosis, int hem.  [employer] hemoccult testing via [facility] 06/22/15 NEG  . ESOPHAGOGASTRODUODENOSCOPY N/A 05/04/2016   Procedure: ESOPHAGOGASTRODUODENOSCOPY (EGD);  Surgeon: Jerene Bears, MD;  Location: [facility];  Service: Gastroenterology;  Laterality: N/A;  . IR GENERIC HISTORICAL  10/03/2016   IR US GUIDE VASC ACCESS RIGHT 10/03/2016 Greggory Keen, MD [facility]  . IR GENERIC HISTORICAL  10/03/2016   IR FLUORO GUIDE PORT INSERTION RIGHT 10/03/2016 Greggory Keen, MD [facility]  . KNEE ARTHROSCOPY WITH MEDIAL MENISECTOMY Right 12/16/2014   Procedure: RIGHT KNEE ARTHROSCOPY WITH MEDIAL MENISECTOMY microfracture medial femoral condyle abrasion condroplasty medial femoral condyle lateral menisectomy;  Surgeon: Tobi Bastos, MD;  Location: [facility];  Service: Orthopedics;  Laterality: Right;  . LEFT HEART CATHETERIZATION WITH CORONARY ANGIOGRAM N/A 10/26/2013   Procedure: LEFT HEART CATHETERIZATION WITH CORONARY ANGIOGRAM;  Surgeon: Jettie Booze, MD;  Location: [facility];  Service: Cardiovascular;  Laterality: N/A;  . LUMBAR [place] SURGERY  2001  . SHOULDER SURGERY Right    right fx  . TONSILLECTOMY    . TRANSTHORACIC ECHOCARDIOGRAM  03/2016   EF 55-60%, grade I DD, LAE  . WRIST SURGERY Right    right fx    SOCIAL HISTORY: Social History   Social History  . Marital status: Married    Spouse name: susan  . Number of children: 2  . Years of education: N/A   Occupational History  . Retired    Social History Main Topics  . Smoking status: Former Smoker    Packs/day: 1.50    Years: 30.00    Types: Cigarettes, Pipe, Cigars    Quit date: 10/30/1983  . Smokeless tobacco: Former Systems developer    Types: Chew    Quit date: 05/15/2016  . Alcohol use No  . Drug use: No  . Sexual activity: Not Currently   Other Topics Concern  . Not on file   Social History Narrative   Married, one son and one daughter.   His daughter and her two  children live with him.   Coffee daily.  Former smoker.  No alcohol.   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   Attends church weekly-   Oxygen continuous       FAMILY HISTORY: Family History  Problem Relation Age of Onset  . Heart attack Mother   . Aneurysm Father   . Alcohol abuse Father   . Diabetes Maternal Aunt   . Colon cancer Neg Hx     ALLERGIES:  has No Known Allergies.  MEDICATIONS:  Current Outpatient Prescriptions  Medication Sig Dispense Refill  . aspirin 81 MG tablet Take 81 mg by mouth daily.      Marland Kitchen atorvastatin (LIPITOR) 80  MG tablet Take 1 tablet (80 mg total) by mouth daily. 90 tablet 3  . beta carotene w/minerals (OCUVITE) tablet Take 1 tablet by mouth daily.    . cetirizine (ZYRTEC) 10 MG tablet Take 10 mg by mouth daily.    . citalopram (CELEXA) 20 MG tablet Take 1 tablet (20 mg total) by mouth daily. 90 tablet 3  . clonazePAM (KLONOPIN) 1 MG tablet TAKE ONE TABLET BY MOUTH TWICE DAILY AS NEEDED FOR ANXIETY 60 tablet 5  . cromolyn (OPTICROM) 4 % ophthalmic solution Place 1 drop into both eyes 4 (four) times daily.    Marland Kitchen gabapentin (NEURONTIN) 300 MG capsule TAKE ONE CAPSULE BY MOUTH THREE TIMES DAILY 270 capsule 3  . hydrOXYzine (ATARAX/VISTARIL) 25 MG tablet 1 tab at supper and 1 at bedtime (for insomnia) 60 tablet 11  . insulin lispro (HUMALOG KWIKPEN) 100 UNIT/ML KiwkPen Inject into the skin. SS at meal time    . Insulin NPH, Human,, Isophane, (HUMULIN N) 100 UNIT/ML Kiwkpen Inject into the skin. 7 units every morning and 7 units every evening    . magnesium chloride (SLOW-MAG) 64 MG TBEC SR tablet Take 1 tablet (64 mg total) by mouth daily. 60 tablet 0  . metFORMIN (GLUCOPHAGE) 1000 MG tablet TAKE ONE TABLET BY MOUTH TWICE DAILY WITH MEALS 180 tablet 1  . Omega-3 Fatty Acids ([employer] FISH OIL) 1000 MG CAPS Take 2 tablets by mouth 2 (two) times daily. 90 capsule   . ondansetron (ZOFRAN) 8 MG tablet Take 1 tablet (8 mg total) by  mouth 2 (two) times daily as needed for refractory nausea / vomiting. Start on day 3 after chemo. 30 tablet 1  . oxyCODONE (OXY IR/ROXICODONE) 5 MG immediate release tablet Take 1 tablet (5 mg total) by mouth every 6 (six) hours as needed for severe pain. 60 tablet 0  . OXYGEN Inhale 2 L/min into the lungs continuous.    . pantoprazole (PROTONIX) 40 MG tablet TAKE ONE TABLET BY MOUTH TWICE DAILY 60 tablet 3  . prochlorperazine (COMPAZINE) 10 MG tablet Take 1 tablet (10 mg total) by mouth every 6 (six) hours as needed (Nausea or vomiting). 30 tablet 1  . ZETIA 10 MG tablet TAKE ONE TABLET BY MOUTH ONCE DAILY 90 tablet 1   No current facility-administered medications for this visit.    Facility-Administered Medications Ordered in Other Visits  Medication Dose Route Frequency Provider Last Rate Last Dose  . CARBOplatin (PARAPLATIN) 300 mg in sodium chloride 0.9 % 250 mL chemo infusion  300 mg Intravenous Once Truitt Merle, MD 560 mL/hr at 11/02/16 1341 300 mg at 11/02/16 1341  . heparin lock flush 100 unit/mL  500 Units Intracatheter Once PRN Truitt Merle, MD      . sodium chloride flush (NS) 0.9 % injection 10 mL  10 mL Intracatheter PRN Truitt Merle, MD        REVIEW OF SYSTEMS:   Constitutional: Denies fevers, chills or abnormal night sweats   Eyes: Denies blurriness of vision, double vision or watery eyes Ears, nose, mouth, throat, and face: Denies mucositis or sore throat Respiratory: Denies cough, dyspnea or wheezes Cardiovascular: Denies palpitation, chest discomfort or lower extremity swelling Gastrointestinal:  Denies nausea, heartburn or change in bowel habits Skin: Denies abnormal skin rashes Lymphatics: Denies new lymphadenopathy or easy bruising Neurological:Denies numbness, tingling or new weaknesses (+) leg weakness, slowly improving with physical therapy Behavioral/Psych: Mood is stable, no new changes  All other systems were reviewed with the patient and  are negative.  PHYSICAL  EXAMINATION: ECOG PERFORMANCE STATUS: 3 - Symptomatic, >50% confined to bed  Vitals:   11/02/16 1032 11/02/16 1052  BP: 124/69 124/69  Pulse: (!) 105 (!) 105  Resp: 18 18  Temp: 98.2 F (36.8 C) 98.2 F (36.8 C)   Filed Weights   11/02/16 1032 11/02/16 1052  Weight: 264 lb 11.2 oz (120.1 kg) 264 lb 11.2 oz (120.1 kg)    GENERAL:alert, no distress and comfortable, morbid obese, sitting in wheelchair with nasal cannula oxygen  SKIN: skin color, texture, turgor are normal, no rashes or significant lesions EYES: normal, conjunctiva are pink and non-injected, sclera clear OROPHARYNX:no exudate, no erythema and lips, buccal mucosa, and tongue normal  NECK: supple, thyroid normal size, non-tender, without nodularity LYMPH:  no palpable lymphadenopathy in the cervical, axillary or inguinal LUNGS: clear, no wheezing HEART: regular rate & rhythm and no murmurs and no lower extremity edema ABDOMEN:abdomen soft, non-tender and normal bowel sounds Musculoskeletal:no cyanosis of digits and no clubbing  PSYCH: alert & oriented x 3 with fluent speech NEURO: no focal motor/sensory deficits EXT: Trace edema, he wears compression stocks, no significant ankle swollen, erythema, or warmness.  LABORATORY DATA:  I have reviewed the data as listed CBC Latest Ref Rng & Units 11/02/2016 10/26/2016 10/05/2016  WBC 4.0 - 10.3 10e3/uL 3.9(L) 4.4 4.9  Hemoglobin 13.0 - 17.1 g/dL 9.6(L) 9.8(L) 10.2(L)  Hematocrit 38.4 - 49.9 % 29.4(L) 30.6(L) 32.5(L)  Platelets 140 - 400 10e3/uL 149 157 148   CMP Latest Ref Rng & Units 11/02/2016 10/26/2016 10/05/2016  Glucose 70 - 140 mg/dl 111 115 128  BUN 7.0 - 26.0 mg/dL 10.2 8.2 7.9  Creatinine 0.7 - 1.3 mg/dL 0.7 0.8 0.8  Sodium 136 - 145 mEq/L 139 141 138  Potassium 3.5 - 5.1 mEq/L 4.0 4.1 4.1  Chloride 96 - 112 mEq/L - - -  CO2 22 - 29 mEq/L _0 Calcium 8.4 - 10.4 mg/dL 8.9 8.8 9.0  Total Protein 6.4 - 8.3 g/dL 6.4 6.2(L) 6.5  Total Bilirubin 0.20 - 1.20  mg/dL 0.50 0.48 0.61  Alkaline Phos 40 - 150 U/L 80 87 90  AST 5 - 34 U/L _1 ALT 0 - 55 U/L _2 PATHOLOGY REPORT Diagnosis 05/04/2016 1. Esophagogastric junction, biopsy, ulcerative mass - POORLY DIFFERENTIATED CARCINOMA, SEE COMMENT. 2. Stomach, biopsy - REACTIVE GASTROPATHY. - NEGATIVE FOR HELICOBACTER PYLORI. - NO INTESTINAL METAPLASIA, DYSPLASIA, OR MALIGNANCY. Microscopic Comment 1. The majority of the specimen consists of gastroesophageal mucosa with reflux changes. There is a small focus of tumor underlying the squamous mucosa. There is lymphovascular invasion. There is no background intestinal metaplasia (Barrett's esophagus). Immunohistochemistry reveals only very focal weak cytokeratin 5/6, negative p63, and negative mucicarmine in the limited remaining tumor. Dr. Saralyn Pilar has reviewed the case. The case was called to Dr. Hilarie Fredrickson on 05/08/2016. 2. A Warthin-Starry stain is performed to determine the possibility of the presence of Helicobacter pylori. The Warthin-Starry stain is negative for organisms of Helicobacter pylori.     RADIOGRAPHIC STUDIES: I have personally reviewed the radiological images as listed and agreed with the findings in the report. Ir US Guide Vasc Access Right  Result Date: 10/03/2016 CLINICAL DATA:  ESOPHAGEAL CANCER, ACCESS FOR CHEMOTHERAPY EXAM: RIGHT INTERNAL JUGULAR SINGLE LUMEN POWER PORT CATHETER INSERTION Date:  12/6/201712/03/2016 4:03 pm Radiologist:  M. Daryll Brod, MD Guidance:  Ultrasound and fluoroscopic MEDICATIONS: 2 g Ancef; The antibiotic was administered within  an appropriate time interval prior to skin puncture. ANESTHESIA/SEDATION: Versed 4.0 mg IV; Fentanyl 150 mcg IV; Moderate Sedation Time:  27 minutes The patient was continuously monitored during the procedure by the interventional radiology nurse under my direct supervision. FLUOROSCOPY TIME:  One minutes, 12 seconds (34 mGy) COMPLICATIONS: None immediate. CONTRAST:   None. PROCEDURE: Informed consent was obtained from the patient following explanation of the procedure, risks, benefits and alternatives. The patient understands, agrees and consents for the procedure. All questions were addressed. A time out was performed. Maximal barrier sterile technique utilized including caps, mask, sterile gowns, sterile gloves, large sterile drape, hand hygiene, and 2% chlorhexidine scrub. Under sterile conditions and local anesthesia, right internal jugular micropuncture venous access was performed. Access was performed with ultrasound. Images were obtained for documentation. A guide wire was inserted followed by a transitional dilator. This allowed insertion of a guide wire and catheter into the IVC. Measurements were obtained from the SVC / RA junction back to the right IJ venotomy site. In the right infraclavicular chest, a subcutaneous pocket was created over the second anterior rib. This was done under sterile conditions and local anesthesia. 1% lidocaine with epinephrine was utilized for this. A 2.5 cm incision was made in the skin. Blunt dissection was performed to create a subcutaneous pocket over the right pectoralis major muscle. The pocket was flushed with saline vigorously. There was adequate hemostasis. The port catheter was assembled and checked for leakage. The port catheter was secured in the pocket with two retention sutures. The tubing was tunneled subcutaneously to the right venotomy site and inserted into the SVC/RA junction through a valved peel-away sheath. Position was confirmed with fluoroscopy. Images were obtained for documentation. The patient tolerated the procedure well. No immediate complications. Incisions were closed in a two layer fashion with 4 - 0 Vicryl suture. Dermabond was applied to the skin. The port catheter was accessed, blood was aspirated followed by saline and heparin flushes. Needle was removed. A dry sterile dressing was applied. IMPRESSION:  Ultrasound and fluoroscopically guided right internal jugular single lumen power port catheter insertion. Tip in the SVC/RA junction. Catheter ready for use. Electronically Signed   By: Jerilynn Mages.  Shick M.D.   On: 10/03/2016 16:34   Ir Fluoro Guide Port Insertion Right  Result Date: 10/03/2016 CLINICAL DATA:  ESOPHAGEAL CANCER, ACCESS FOR CHEMOTHERAPY EXAM: RIGHT INTERNAL JUGULAR SINGLE LUMEN POWER PORT CATHETER INSERTION Date:  12/6/201712/03/2016 4:03 pm Radiologist:  M. Daryll Brod, MD Guidance:  Ultrasound and fluoroscopic MEDICATIONS: 2 g Ancef; The antibiotic was administered within an appropriate time interval prior to skin puncture. ANESTHESIA/SEDATION: Versed 4.0 mg IV; Fentanyl 150 mcg IV; Moderate Sedation Time:  27 minutes The patient was continuously monitored during the procedure by the interventional radiology nurse under my direct supervision. FLUOROSCOPY TIME:  One minutes, 12 seconds (34 mGy) COMPLICATIONS: None immediate. CONTRAST:  None. PROCEDURE: Informed consent was obtained from the patient following explanation of the procedure, risks, benefits and alternatives. The patient understands, agrees and consents for the procedure. All questions were addressed. A time out was performed. Maximal barrier sterile technique utilized including caps, mask, sterile gowns, sterile gloves, large sterile drape, hand hygiene, and 2% chlorhexidine scrub. Under sterile conditions and local anesthesia, right internal jugular micropuncture venous access was performed. Access was performed with ultrasound. Images were obtained for documentation. A guide wire was inserted followed by a transitional dilator. This allowed insertion of a guide wire and catheter into the IVC. Measurements were obtained from the  SVC / RA junction back to the right IJ venotomy site. In the right infraclavicular chest, a subcutaneous pocket was created over the second anterior rib. This was done under sterile conditions and local anesthesia.  1% lidocaine with epinephrine was utilized for this. A 2.5 cm incision was made in the skin. Blunt dissection was performed to create a subcutaneous pocket over the right pectoralis major muscle. The pocket was flushed with saline vigorously. There was adequate hemostasis. The port catheter was assembled and checked for leakage. The port catheter was secured in the pocket with two retention sutures. The tubing was tunneled subcutaneously to the right venotomy site and inserted into the SVC/RA junction through a valved peel-away sheath. Position was confirmed with fluoroscopy. Images were obtained for documentation. The patient tolerated the procedure well. No immediate complications. Incisions were closed in a two layer fashion with 4 - 0 Vicryl suture. Dermabond was applied to the skin. The port catheter was accessed, blood was aspirated followed by saline and heparin flushes. Needle was removed. A dry sterile dressing was applied. IMPRESSION: Ultrasound and fluoroscopically guided right internal jugular single lumen power port catheter insertion. Tip in the SVC/RA junction. Catheter ready for use. Electronically Signed   By: Jerilynn Mages.  Shick M.D.   On: 10/03/2016 16:34   EGD 05/04/2016 A large, ulcerating mass with no active bleeding and no stigmata of recent bleeding was found at the gastroesophageal junction extending into the gastric cardia, beginning 40 cm from the incisors. The mass was non-obstructing and partially circumferential (involving one-half of the lumen circumference). The mass extends approximately 5 cm. IMPRESSION:  -Likely malignant esophageal tumor was found at the gastroesophageal junction extending into the gastric cardia. Biopsied. - Non-bleeding erosive gastropathy. Biopsied. - Erythematous duodenopathy. - Normal second portion of the duodenum.  ASSESSMENT & PLAN: 70 y.o. Caucasian male, with multiple comorbidities, including hypertension, diabetes, OSA, coronary artery disease,  diastolic CHF, on continuous oxygen, morbid obesity, presented with progressive dysphagia and odynophagia.  1. Cancer of cardio-esophageal junction, poorly differentiated, cTxN2M1, stage IV  -I previously reviewed his CT scan, EGD, and the biopsy results with patient and his family members in great details. -His biopsy pathology was reviewed in our tumor board 2 days ago, the morphology and weakly positive for CK 5/6, fevers squamous cell carcinoma -His CT scan findings are very concerning for distant metastasis to abdominal lymph nodes.  -I previously reviewed the PET scan images with patient and his daughter in person, he has hypermetabolic GE junction tumor, and diffuse adenopathy in mediastinum and upper abdomen. -We previously discussed the aggressive nature of esophageal cancer, an incurable nature of his disease due to the distant metastasis. The goal of therapy is palliative, to prolong his life and palliate his symptoms. -His tumor was negative for PD-L1, not a candidate for immunotherapy  -He has completed palliative radiation -He received weekly carbo and Taxol. Weak and developed severe worsening arthralgia in his knees and ankles, cannot walk, could be related to Taxol, resolved after taking pain meds for 4-5 days -He has recovered very well from his recent hospitalization, was to restart chemotherapy. I'll do weekly carboplatin and Taxol, with slight dose reduction on Taxol, 2 weeks on, 2 weeks off. He seems to be tolerating well so far, we'll continue. His labs are adequate for treatment. -I previously reviewed his CT scan findings on 09/26/2016 show good partial response to chemoradiation, no new or progressive metastatic disease. -Repeat CT scan in February 2018.  2. CAD, diastolic CHF,  on continuous oxygen -He will continue follow-up with his cardiologist Dr. Radford Pax -His Plavix has been held since the EGD and biopsy, he is on baby aspirin. -Need to watch his fluid intake during  his chemotherapy, and also avoid fluid overload from chemo  -he is back on lasix, dsypnea improved   3. DM, HTN -We discussed his blood pressure and glucose need to be monitored closely during the chemotherapy, which may affect his blood glucose and blood pressure -He will continue medication, monitor his sugar and blood pressure at home, and follow-up with his primary care physician -will decrease premed dexa before taxol  -History simple has been held due to his borderline low blood pressure -He will see his primary care physician Dr. Anitra Lauth for diabetes management -High blood glucose 111 TODAY.  4. OSA, morbid obesity -We discussed healthy diet, I encouraged him to to be more physically active, and consider home PT -I strongly encouraged him to follow-up with dietitian during the therapy for nutrition support.  5. Epigastric pain -Likely secondary to his underlying malignancy -worse after radiation, resolved now  -continue tramadol as need   6. Arthralgia -He has baseline osteoarthritis, not physically active -However his arthralgia in his knees and ankle are much worse after chemotherapy -He will use oxycodone 5 mg as needed, constipation management reviewed with him -Leg weakness improved with physical therapy. Encouraged the patient to continue with physical therapy.   7. Anemia -Second is to underlying malignancy and chemotherapy -Hemoglobin is 9.6 TODAY. -we'll consider blood transfusion if hemoglobin less than 8, for symptomatic anemia  8. Goal of care discussion  -We again discussed the incurable nature of his cancer, and the overall poor prognosis, especially if he does not have good response to chemotherapy or progress on chemo -The patient understands the goal of care is palliative. -Patient is very realistic, understands his cancer is not curable, and he agrees with DO NOT RESUSCITATE and DO NOT INTUBATE  PLAN -He will receive chemo carboplatin and Taxol treatment  today, then he will be off chemotherapy for 2 weeks. -Lab, flush and weekly carbo and taxol in 3 and 4 weeks -Follow up in 3 weeks. -Repeat CT scan in February.  All questions were answered. The patient knows to call the clinic with any problems, questions or concerns.  I spent 30 minutes counseling the patient face to face. The total time spent in the appointment was 40 minutes and more than 35% was on counseling.    Truitt Merle, MD 11/02/2016   This document serves as a record of services personally performed by Truitt Merle, MD. It was created on her behalf by Darcus Austin, a trained medical scribe. The creation of this record is based on the scribe's personal observations and the provider's statements to them. This document has been checked and approved by the attending provider.

## 2016-10-25 ENCOUNTER — Other Ambulatory Visit: Payer: Self-pay | Admitting: Hematology

## 2016-10-25 ENCOUNTER — Encounter: Payer: Self-pay | Admitting: Family Medicine

## 2016-10-25 ENCOUNTER — Ambulatory Visit (INDEPENDENT_AMBULATORY_CARE_PROVIDER_SITE_OTHER): Payer: Medicare Other | Admitting: Family Medicine

## 2016-10-25 VITALS — BP 122/77 | HR 106 | Temp 96.1°F | Resp 18 | Wt 266.5 lb

## 2016-10-25 DIAGNOSIS — Z794 Long term (current) use of insulin: Secondary | ICD-10-CM | POA: Diagnosis not present

## 2016-10-25 DIAGNOSIS — N182 Chronic kidney disease, stage 2 (mild): Secondary | ICD-10-CM

## 2016-10-25 DIAGNOSIS — J449 Chronic obstructive pulmonary disease, unspecified: Secondary | ICD-10-CM | POA: Diagnosis not present

## 2016-10-25 DIAGNOSIS — E118 Type 2 diabetes mellitus with unspecified complications: Secondary | ICD-10-CM

## 2016-10-25 DIAGNOSIS — I10 Essential (primary) hypertension: Secondary | ICD-10-CM | POA: Diagnosis not present

## 2016-10-25 LAB — POCT GLYCOSYLATED HEMOGLOBIN (HGB A1C): Hemoglobin A1C: 5.7

## 2016-10-25 NOTE — Progress Notes (Signed)
OFFICE VISIT  10/25/2016   CC:  Chief Complaint  Patient presents with  . Follow-up    RCI   HPI:    Patient is a 70 y.o. Caucasian male with advanced esophageal cancer currently getting palliative chemotherapy who presents for 6 week f/u DM 2, HTN, CRI stage II. He also has hx of diastolic CHF and oxygen-dependent COPD.  Reviewed insulin log: AM gluc's avg 170s, PM glucs avg 180s or more.  Taking 5 U humulin NPH and also SS Humalog at mealtime.  Appetite pretty variable.  No pain with eating.   No hypoglycemia.    No home bp monitoring being done.  Past Medical History:  Diagnosis Date  . Asthma    as a child  . Cataract    multiple types, bilateral  . Chronic diastolic CHF (congestive heart failure) ([place]) 02/25/2009  . Chronic renal insufficiency, stage III (moderate) 2017   Stage II/III (GFR around 60)  . 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 COPD (chronic obstructive pulmonary disease) ([place])   . Coronary artery disease    chronically occluded LAD and diagonal with right to left collaterals, mild disease in the left circ and moderate disease in the mid RCA on medical management with Imdur, ASA, and Plavix.  . Dermatitis 05/2014  . DIABETES MELLITUS, TYPE II 06/24/2007   No diab retpthy as of 08/05/15 eye exam.  . Esophageal cancer ([place]) 04/2016   poorly differentiated carcinoma (Dr. Pyrtle--EGD).  CT C/A/P showed metastatic adenopathy in mediastinum and upper abdomen 05/09/16.  Tx plan is palliative radiation (completed 06/22/16), then palliative systemic chemotherapy (carbo+taxol) was started but as of 08/03/16 onc f/u this was held due to severe knee and ankle arthralgias.  Restarting as of 09/2016 (08/2016 CT showed some disease regression  . Essential hypertension 05/01/2007   Qualifier: Diagnosis of  By: Tiney Rouge CMA, Ellison Hughs     . History of kidney stones   . Hyperkalemia 12/2015   Decreased ACE-I by 50% in  response, then potassium normalized.  Marland Kitchen HYPERLIPIDEMIA 06/24/2007  . HYPERTENSION 05/01/2007  . Hypoxemia 01/27/2010  . Myocardial infarction    pt states he was informed per MD that he has had one but pt was unaware   . OBESITY 09/02/2008  . Obesity hypoventilation syndrome (HCC)    oxygen 24/7 (2 liters [place] as of 02/2016)  . On home oxygen therapy    Oxygen @ 2l/m nasally 24/7 hours  . OSA (obstructive sleep apnea)    not tested; pt scored 4 per stop bang tool results sent to PCP   . OSTEOARTHRITIS 05/01/2007  . Presbycusis of both ears 05/2015   [facility]  . Pruritic condition 05/2014   Allergist summer 2015, no new testing.  [name] field defect 05/10/16   Per Dr. Melina Fiddler, O.D.: Rt, loss of inf/temp quad and some loss sup/temp.  ?CVA  ? Pituitary tumor? ?Brain met.    Past Surgical History:  Procedure Laterality Date  . APPENDECTOMY    . BALLOON DILATION N/A 05/04/2016   Procedure: BALLOON DILATION;  Surgeon: Jerene Bears, MD;  Location: [facility];  Service: Gastroenterology;  Laterality: N/A;  . CARDIAC CATHETERIZATION    . CARPAL TUNNEL RELEASE Left   . CATARACT EXTRACTION W/PHACO Right 04/29/2013   Procedure: CATARACT EXTRACTION PHACO AND INTRAOCULAR LENS PLACEMENT (IOC);  Surgeon: Adonis Brook, MD;  Location: [facility];  Service: Ophthalmology;  Laterality:  Right;  Marland Kitchen CATARACT EXTRACTION, BILATERAL    . COLONOSCOPY W/ POLYPECTOMY  08/2011   Many polyps--all hyperplastic, severe diverticulosis, int hem.  [employer] hemoccult testing via [facility] 06/22/15 NEG  . ESOPHAGOGASTRODUODENOSCOPY N/A 05/04/2016   Procedure: ESOPHAGOGASTRODUODENOSCOPY (EGD);  Surgeon: Jerene Bears, MD;  Location: [facility];  Service: Gastroenterology;  Laterality: N/A;  . IR GENERIC HISTORICAL  10/03/2016   IR US GUIDE VASC ACCESS RIGHT 10/03/2016 Greggory Keen, MD [facility]  . IR GENERIC HISTORICAL  10/03/2016   IR FLUORO GUIDE PORT INSERTION RIGHT 10/03/2016 Greggory Keen, MD [facility]  . KNEE ARTHROSCOPY WITH  MEDIAL MENISECTOMY Right 12/16/2014   Procedure: RIGHT KNEE ARTHROSCOPY WITH MEDIAL MENISECTOMY microfracture medial femoral condyle abrasion condroplasty medial femoral condyle lateral menisectomy;  Surgeon: Tobi Bastos, MD;  Location: [facility];  Service: Orthopedics;  Laterality: Right;  . LEFT HEART CATHETERIZATION WITH CORONARY ANGIOGRAM N/A 10/26/2013   Procedure: LEFT HEART CATHETERIZATION WITH CORONARY ANGIOGRAM;  Surgeon: Jettie Booze, MD;  Location[facility];  Service: Cardiovascular;  Laterality: N/A;  . LUMBAR [place] SURGERY  2001  . SHOULDER SURGERY Right    right fx  . TONSILLECTOMY    . TRANSTHORACIC ECHOCARDIOGRAM  03/2016   EF 55-60%, grade I DD, LAE  . WRIST SURGERY Right    right fx    Outpatient Medications Prior to Visit  Medication Sig Dispense Refill  . aspirin 81 MG tablet Take 81 mg by mouth daily.      Marland Kitchen atorvastatin (LIPITOR) 80 MG tablet Take 1 tablet (80 mg total) by mouth daily. 90 tablet 3  . beta carotene w/minerals (OCUVITE) tablet Take 1 tablet by mouth daily.    . cetirizine (ZYRTEC) 10 MG tablet Take 10 mg by mouth daily.    . citalopram (CELEXA) 20 MG tablet Take 1 tablet (20 mg total) by mouth daily. 90 tablet 3  . clonazePAM (KLONOPIN) 1 MG tablet TAKE ONE TABLET BY MOUTH TWICE DAILY AS NEEDED FOR ANXIETY 60 tablet 5  . cromolyn (OPTICROM) 4 % ophthalmic solution Place 1 drop into both eyes 4 (four) times daily.    Marland Kitchen gabapentin (NEURONTIN) 300 MG capsule TAKE ONE CAPSULE BY MOUTH THREE TIMES DAILY 270 capsule 3  . hydrOXYzine (ATARAX/VISTARIL) 25 MG tablet 1 tab at supper and 1 at bedtime (for insomnia) 60 tablet 11  . magnesium chloride (SLOW-MAG) 64 MG TBEC SR tablet Take 1 tablet (64 mg total) by mouth daily. 60 tablet 0  . metFORMIN (GLUCOPHAGE) 1000 MG tablet TAKE ONE TABLET BY MOUTH TWICE DAILY WITH MEALS 180 tablet 1  . Omega-3 Fatty Acids ([employer] FISH OIL) 1000 MG CAPS Take 2 tablets by mouth 2 (two) times daily. 90 capsule   .  ondansetron (ZOFRAN) 8 MG tablet Take 1 tablet (8 mg total) by mouth 2 (two) times daily as needed for refractory nausea / vomiting. Start on day 3 after chemo. 30 tablet 1  . oxyCODONE (OXY IR/ROXICODONE) 5 MG immediate release tablet Take 1 tablet (5 mg total) by mouth every 6 (six) hours as needed for severe pain. 60 tablet 0  . OXYGEN Inhale 2 L/min into the lungs continuous.    . pantoprazole (PROTONIX) 40 MG tablet TAKE ONE TABLET BY MOUTH TWICE DAILY 60 tablet 3  . prochlorperazine (COMPAZINE) 10 MG tablet Take 1 tablet (10 mg total) by mouth every 6 (six) hours as needed (Nausea or vomiting). 30 tablet 1  . ZETIA 10 MG tablet TAKE ONE TABLET  BY MOUTH ONCE DAILY 90 tablet 1   No facility-administered medications prior to visit.     No Known Allergies  ROS As per HPI  PE: Blood pressure 122/77, pulse (!) 106, temperature (!) 96.1 F (35.6 C), temperature source Temporal, resp. rate 18, weight 266 lb 8 oz (120.9 kg), SpO2 98 %. Gen: Alert, well appearing.  Patient is oriented to person, place, time, and situation. GYJ:EHUD: no injection, icteris, swelling, or exudate.  EOMI, PERRLA. Mouth: lips without lesion/swelling.  Oral mucosa pink and moist. Oropharynx without erythema, exudate, or swelling.  CV: RRR, no m/r/g.   LUNGS: CTA bilat, nonlabored resps, good aeration in all lung fields. EXT: no cyanosis.  He has 1+ pitting edema in pretibial regions/ankles bilat.  LABS:    Chemistry      Component Value Date/Time   NA 138 10/05/2016 0842   K 4.1 10/05/2016 0842   CL 101 09/14/2016 1025   CO2 26 10/05/2016 0842   BUN 7.9 10/05/2016 0842   CREATININE 0.8 10/05/2016 0842      Component Value Date/Time   CALCIUM 9.0 10/05/2016 0842   ALKPHOS 90 10/05/2016 0842   AST 27 10/05/2016 0842   ALT 25 10/05/2016 0842   BILITOT 0.61 10/05/2016 0842     Lab Results  Component Value Date   WBC 4.9 10/05/2016   HGB 10.2 (L) 10/05/2016   HCT 32.5 (L) 10/05/2016   MCV 90.3  10/05/2016   PLT 148 10/05/2016   Lab Results  Component Value Date   HGBA1C 4.8 07/10/2016   POCT HbA1c today was 5.7%  IMPRESSION AND PLAN:  1) DM 2: A1c rising but still very good. Home gluc's not ideal: increase NPH to 7 U qAM and 7U qPM. Continue SS Humalog at mealtimes.  2) HTN: since pt's wt loss he has not required any medications for HTN.  3) CRI stage II: recent lytes/cr stable 10/05/16.  4) COPD, oxygen-dependent: stable.  Currently on oxygen 24/7 but no scheduled inhalers.  5) Advanced esophageal cancer: continue chemo as per oncology.  An After Visit Summary was printed and given to the patient.  FOLLOW UP: Return in about 3 months (around 01/23/2017) for routine chronic illness f/u (30 min).  Signed:  Crissie Sickles, MD           10/25/2016

## 2016-10-26 ENCOUNTER — Ambulatory Visit: Payer: Medicare Other

## 2016-10-26 ENCOUNTER — Other Ambulatory Visit (HOSPITAL_BASED_OUTPATIENT_CLINIC_OR_DEPARTMENT_OTHER): Payer: Medicare Other

## 2016-10-26 ENCOUNTER — Ambulatory Visit (HOSPITAL_BASED_OUTPATIENT_CLINIC_OR_DEPARTMENT_OTHER): Payer: Medicare Other

## 2016-10-26 ENCOUNTER — Encounter: Payer: Self-pay | Admitting: *Deleted

## 2016-10-26 VITALS — BP 137/82 | HR 103 | Temp 97.9°F | Resp 18

## 2016-10-26 DIAGNOSIS — C160 Malignant neoplasm of cardia: Secondary | ICD-10-CM

## 2016-10-26 DIAGNOSIS — Z95828 Presence of other vascular implants and grafts: Secondary | ICD-10-CM

## 2016-10-26 DIAGNOSIS — Z5111 Encounter for antineoplastic chemotherapy: Secondary | ICD-10-CM | POA: Diagnosis not present

## 2016-10-26 LAB — CBC WITH DIFFERENTIAL/PLATELET
BASO%: 0.5 % (ref 0.0–2.0)
Basophils Absolute: 0 10*3/uL (ref 0.0–0.1)
EOS ABS: 0.1 10*3/uL (ref 0.0–0.5)
EOS%: 2.5 % (ref 0.0–7.0)
HEMATOCRIT: 30.6 % — AB (ref 38.4–49.9)
HEMOGLOBIN: 9.8 g/dL — AB (ref 13.0–17.1)
LYMPH%: 15.4 % (ref 14.0–49.0)
MCH: 28.3 pg (ref 27.2–33.4)
MCHC: 32.1 g/dL (ref 32.0–36.0)
MCV: 88 fL (ref 79.3–98.0)
MONO#: 0.5 10*3/uL (ref 0.1–0.9)
MONO%: 11.8 % (ref 0.0–14.0)
NEUT%: 69.8 % (ref 39.0–75.0)
NEUTROS ABS: 3.1 10*3/uL (ref 1.5–6.5)
Platelets: 157 10*3/uL (ref 140–400)
RBC: 3.48 10*6/uL — ABNORMAL LOW (ref 4.20–5.82)
RDW: 18 % — AB (ref 11.0–14.6)
WBC: 4.4 10*3/uL (ref 4.0–10.3)
lymph#: 0.7 10*3/uL — ABNORMAL LOW (ref 0.9–3.3)

## 2016-10-26 LAB — COMPREHENSIVE METABOLIC PANEL
ALBUMIN: 2.8 g/dL — AB (ref 3.5–5.0)
ALK PHOS: 87 U/L (ref 40–150)
ALT: 17 U/L (ref 0–55)
AST: 21 U/L (ref 5–34)
Anion Gap: 11 mEq/L (ref 3–11)
BILIRUBIN TOTAL: 0.48 mg/dL (ref 0.20–1.20)
BUN: 8.2 mg/dL (ref 7.0–26.0)
CO2: 26 mEq/L (ref 22–29)
CREATININE: 0.8 mg/dL (ref 0.7–1.3)
Calcium: 8.8 mg/dL (ref 8.4–10.4)
Chloride: 105 mEq/L (ref 98–109)
EGFR: >90 mL/min/{1.73_m2} (ref >90)
GLUCOSE: 115 mg/dL (ref 70–140)
Potassium: 4.1 mEq/L (ref 3.5–5.1)
SODIUM: 141 meq/L (ref 136–145)
TOTAL PROTEIN: 6.2 g/dL — AB (ref 6.4–8.3)

## 2016-10-26 MED ORDER — HEPARIN SOD (PORK) LOCK FLUSH 100 UNIT/ML IV SOLN
500.0000 [IU] | Freq: Once | INTRAVENOUS | Status: AC | PRN
  Administered 2016-10-26: 500 [IU]
  Filled 2016-10-26: qty 5

## 2016-10-26 MED ORDER — FAMOTIDINE IN NACL 20-0.9 MG/50ML-% IV SOLN
20.0000 mg | Freq: Once | INTRAVENOUS | Status: AC
  Administered 2016-10-26: 20 mg via INTRAVENOUS

## 2016-10-26 MED ORDER — DIPHENHYDRAMINE HCL 50 MG/ML IJ SOLN
INTRAMUSCULAR | Status: AC
  Filled 2016-10-26: qty 1

## 2016-10-26 MED ORDER — SODIUM CHLORIDE 0.9% FLUSH
10.0000 mL | INTRAVENOUS | Status: DC | PRN
  Administered 2016-10-26: 10 mL
  Filled 2016-10-26: qty 10

## 2016-10-26 MED ORDER — FAMOTIDINE IN NACL 20-0.9 MG/50ML-% IV SOLN
INTRAVENOUS | Status: AC
  Filled 2016-10-26: qty 50

## 2016-10-26 MED ORDER — PACLITAXEL CHEMO INJECTION 300 MG/50ML
70.0000 mg/m2 | Freq: Once | INTRAVENOUS | Status: AC
  Administered 2016-10-26: 180 mg via INTRAVENOUS
  Filled 2016-10-26: qty 30

## 2016-10-26 MED ORDER — DIPHENHYDRAMINE HCL 50 MG/ML IJ SOLN
25.0000 mg | Freq: Once | INTRAMUSCULAR | Status: AC
  Administered 2016-10-26: 25 mg via INTRAVENOUS

## 2016-10-26 MED ORDER — SODIUM CHLORIDE 0.9% FLUSH
10.0000 mL | INTRAVENOUS | Status: DC | PRN
  Administered 2016-10-26: 10 mL via INTRAVENOUS
  Filled 2016-10-26: qty 10

## 2016-10-26 MED ORDER — SODIUM CHLORIDE 0.9 % IV SOLN
300.0000 mg | Freq: Once | INTRAVENOUS | Status: AC
  Administered 2016-10-26: 300 mg via INTRAVENOUS
  Filled 2016-10-26: qty 30

## 2016-10-26 MED ORDER — DEXAMETHASONE SODIUM PHOSPHATE 10 MG/ML IJ SOLN
INTRAMUSCULAR | Status: AC
  Filled 2016-10-26: qty 1

## 2016-10-26 MED ORDER — PALONOSETRON HCL INJECTION 0.25 MG/5ML
0.2500 mg | Freq: Once | INTRAVENOUS | Status: AC
  Administered 2016-10-26: 0.25 mg via INTRAVENOUS

## 2016-10-26 MED ORDER — PALONOSETRON HCL INJECTION 0.25 MG/5ML
INTRAVENOUS | Status: AC
  Filled 2016-10-26: qty 5

## 2016-10-26 MED ORDER — SODIUM CHLORIDE 0.9 % IV SOLN
Freq: Once | INTRAVENOUS | Status: AC
  Administered 2016-10-26: 13:00:00 via INTRAVENOUS

## 2016-10-26 MED ORDER — DEXAMETHASONE SODIUM PHOSPHATE 10 MG/ML IJ SOLN
10.0000 mg | Freq: Once | INTRAMUSCULAR | Status: AC
  Administered 2016-10-26: 10 mg via INTRAVENOUS

## 2016-10-26 NOTE — Progress Notes (Signed)
Oncology Nurse Navigator Documentation  Oncology Nurse Navigator Flowsheets 10/26/2016  Navigator Location [facility]  Referral date to RadOnc/MedOnc -  Navigator Encounter Type Treatment  Abnormal Finding Date -  Confirmed Diagnosis Date -  Treatment Initiated Date 07/06/2016  Patient Visit Type MedOnc  Treatment Phase Active Tx--Taxol/Carbo #7  Barriers/Navigation Needs No barriers at this time;No Questions;No Needs  Education -  Interventions None required  Referrals -  Coordination of Care -  Education Method -  Support Groups/Services -Feeling well emotionally and physically. Tell navigator that a donation from [facility] has allowed them to schedule building a ramp to get into his home on Jan 8th.   Acuity Level 1  Time Spent with Patient 15  Tells navigator that he is eating a normal diet now.

## 2016-10-26 NOTE — Patient Instructions (Signed)
[facility] Discharge Instructions for Patients Receiving Chemotherapy  Today you received the following chemotherapy agents :  Taxol,  Carboplatin.  To help prevent nausea and vomiting after your treatment, we encourage you to take your nausea medication as prescribed.   If you develop nausea and vomiting that is not controlled by your nausea medication, call the clinic.   BELOW ARE SYMPTOMS THAT SHOULD BE REPORTED IMMEDIATELY:  *FEVER GREATER THAN 100.5 F  *CHILLS WITH OR WITHOUT FEVER  NAUSEA AND VOMITING THAT IS NOT CONTROLLED WITH YOUR NAUSEA MEDICATION  *UNUSUAL SHORTNESS OF BREATH  *UNUSUAL BRUISING OR BLEEDING  TENDERNESS IN MOUTH AND THROAT WITH OR WITHOUT PRESENCE OF ULCERS  *URINARY PROBLEMS  *BOWEL PROBLEMS  UNUSUAL RASH Items with * indicate a potential emergency and should be followed up as soon as possible.  Feel free to call the clinic you have any questions or concerns. The clinic phone number is [phone].  Please show the CHEMO ALERT CARD at check-in to the Emergency Department and triage nurse.   

## 2016-10-29 DIAGNOSIS — K7460 Unspecified cirrhosis of liver: Secondary | ICD-10-CM

## 2016-10-29 DIAGNOSIS — D61810 Antineoplastic chemotherapy induced pancytopenia: Secondary | ICD-10-CM

## 2016-10-29 HISTORY — DX: Antineoplastic chemotherapy induced pancytopenia: D61.810

## 2016-10-29 HISTORY — DX: Unspecified cirrhosis of liver: K74.60

## 2016-11-02 ENCOUNTER — Other Ambulatory Visit (HOSPITAL_BASED_OUTPATIENT_CLINIC_OR_DEPARTMENT_OTHER): Payer: Medicare Other

## 2016-11-02 ENCOUNTER — Ambulatory Visit (HOSPITAL_BASED_OUTPATIENT_CLINIC_OR_DEPARTMENT_OTHER): Payer: Medicare Other | Admitting: Hematology

## 2016-11-02 ENCOUNTER — Encounter: Payer: Self-pay | Admitting: Hematology

## 2016-11-02 ENCOUNTER — Ambulatory Visit: Payer: Medicare Other

## 2016-11-02 ENCOUNTER — Telehealth: Payer: Self-pay | Admitting: *Deleted

## 2016-11-02 ENCOUNTER — Ambulatory Visit (HOSPITAL_BASED_OUTPATIENT_CLINIC_OR_DEPARTMENT_OTHER): Payer: Medicare Other

## 2016-11-02 ENCOUNTER — Telehealth: Payer: Self-pay | Admitting: Hematology

## 2016-11-02 VITALS — BP 124/69 | HR 105 | Temp 98.2°F | Resp 18 | Ht 69.0 in | Wt 264.7 lb

## 2016-11-02 VITALS — BP 145/75 | HR 110 | Resp 18

## 2016-11-02 DIAGNOSIS — C160 Malignant neoplasm of cardia: Secondary | ICD-10-CM

## 2016-11-02 DIAGNOSIS — R1013 Epigastric pain: Secondary | ICD-10-CM

## 2016-11-02 DIAGNOSIS — D630 Anemia in neoplastic disease: Secondary | ICD-10-CM

## 2016-11-02 DIAGNOSIS — E119 Type 2 diabetes mellitus without complications: Secondary | ICD-10-CM

## 2016-11-02 DIAGNOSIS — Z7189 Other specified counseling: Secondary | ICD-10-CM | POA: Insufficient documentation

## 2016-11-02 DIAGNOSIS — Z95828 Presence of other vascular implants and grafts: Secondary | ICD-10-CM

## 2016-11-02 DIAGNOSIS — Z5111 Encounter for antineoplastic chemotherapy: Secondary | ICD-10-CM

## 2016-11-02 DIAGNOSIS — I5032 Chronic diastolic (congestive) heart failure: Secondary | ICD-10-CM

## 2016-11-02 DIAGNOSIS — I10 Essential (primary) hypertension: Secondary | ICD-10-CM | POA: Diagnosis not present

## 2016-11-02 LAB — COMPREHENSIVE METABOLIC PANEL
ALT: 19 U/L (ref 0–55)
ANION GAP: 10 meq/L (ref 3–11)
AST: 21 U/L (ref 5–34)
Albumin: 3.1 g/dL — ABNORMAL LOW (ref 3.5–5.0)
Alkaline Phosphatase: 80 U/L (ref 40–150)
BILIRUBIN TOTAL: 0.5 mg/dL (ref 0.20–1.20)
BUN: 10.2 mg/dL (ref 7.0–26.0)
CALCIUM: 8.9 mg/dL (ref 8.4–10.4)
CO2: 27 meq/L (ref 22–29)
Chloride: 102 mEq/L (ref 98–109)
Creatinine: 0.7 mg/dL (ref 0.7–1.3)
Glucose: 111 mg/dl (ref 70–140)
Potassium: 4 mEq/L (ref 3.5–5.1)
Sodium: 139 mEq/L (ref 136–145)
TOTAL PROTEIN: 6.4 g/dL (ref 6.4–8.3)

## 2016-11-02 LAB — CBC WITH DIFFERENTIAL/PLATELET
BASO%: 0.7 % (ref 0.0–2.0)
BASOS ABS: 0 10*3/uL (ref 0.0–0.1)
EOS%: 3 % (ref 0.0–7.0)
Eosinophils Absolute: 0.1 10*3/uL (ref 0.0–0.5)
HEMATOCRIT: 29.4 % — AB (ref 38.4–49.9)
HGB: 9.6 g/dL — ABNORMAL LOW (ref 13.0–17.1)
LYMPH#: 0.7 10*3/uL — AB (ref 0.9–3.3)
LYMPH%: 17.6 % (ref 14.0–49.0)
MCH: 28.2 pg (ref 27.2–33.4)
MCHC: 32.5 g/dL (ref 32.0–36.0)
MCV: 86.8 fL (ref 79.3–98.0)
MONO#: 0.3 10*3/uL (ref 0.1–0.9)
MONO%: 7.1 % (ref 0.0–14.0)
NEUT%: 71.6 % (ref 39.0–75.0)
NEUTROS ABS: 2.8 10*3/uL (ref 1.5–6.5)
Platelets: 149 10*3/uL (ref 140–400)
RBC: 3.38 10*6/uL — ABNORMAL LOW (ref 4.20–5.82)
RDW: 17.5 % — AB (ref 11.0–14.6)
WBC: 3.9 10*3/uL — ABNORMAL LOW (ref 4.0–10.3)

## 2016-11-02 MED ORDER — DIPHENHYDRAMINE HCL 50 MG/ML IJ SOLN
INTRAMUSCULAR | Status: AC
  Filled 2016-11-02: qty 1

## 2016-11-02 MED ORDER — HEPARIN SOD (PORK) LOCK FLUSH 100 UNIT/ML IV SOLN
500.0000 [IU] | Freq: Once | INTRAVENOUS | Status: AC | PRN
  Administered 2016-11-02: 500 [IU]
  Filled 2016-11-02: qty 5

## 2016-11-02 MED ORDER — PALONOSETRON HCL INJECTION 0.25 MG/5ML
INTRAVENOUS | Status: AC
  Filled 2016-11-02: qty 5

## 2016-11-02 MED ORDER — DEXAMETHASONE SODIUM PHOSPHATE 10 MG/ML IJ SOLN
INTRAMUSCULAR | Status: AC
  Filled 2016-11-02: qty 1

## 2016-11-02 MED ORDER — SODIUM CHLORIDE 0.9 % IV SOLN
Freq: Once | INTRAVENOUS | Status: AC
  Administered 2016-11-02: 12:00:00 via INTRAVENOUS

## 2016-11-02 MED ORDER — SODIUM CHLORIDE 0.9 % IV SOLN
70.0000 mg/m2 | Freq: Once | INTRAVENOUS | Status: AC
  Administered 2016-11-02: 180 mg via INTRAVENOUS
  Filled 2016-11-02: qty 30

## 2016-11-02 MED ORDER — SODIUM CHLORIDE 0.9% FLUSH
10.0000 mL | INTRAVENOUS | Status: DC | PRN
  Administered 2016-11-02: 10 mL via INTRAVENOUS
  Filled 2016-11-02: qty 10

## 2016-11-02 MED ORDER — DIPHENHYDRAMINE HCL 50 MG/ML IJ SOLN
25.0000 mg | Freq: Once | INTRAMUSCULAR | Status: AC
  Administered 2016-11-02: 25 mg via INTRAVENOUS

## 2016-11-02 MED ORDER — SODIUM CHLORIDE 0.9% FLUSH
10.0000 mL | INTRAVENOUS | Status: DC | PRN
  Administered 2016-11-02: 10 mL
  Filled 2016-11-02: qty 10

## 2016-11-02 MED ORDER — DEXAMETHASONE SODIUM PHOSPHATE 10 MG/ML IJ SOLN
10.0000 mg | Freq: Once | INTRAMUSCULAR | Status: AC
  Administered 2016-11-02: 10 mg via INTRAVENOUS

## 2016-11-02 MED ORDER — PALONOSETRON HCL INJECTION 0.25 MG/5ML
0.2500 mg | Freq: Once | INTRAVENOUS | Status: AC
  Administered 2016-11-02: 0.25 mg via INTRAVENOUS

## 2016-11-02 MED ORDER — FAMOTIDINE IN NACL 20-0.9 MG/50ML-% IV SOLN
20.0000 mg | Freq: Once | INTRAVENOUS | Status: AC
  Administered 2016-11-02: 20 mg via INTRAVENOUS

## 2016-11-02 MED ORDER — SODIUM CHLORIDE 0.9 % IV SOLN
300.0000 mg | Freq: Once | INTRAVENOUS | Status: AC
  Administered 2016-11-02: 300 mg via INTRAVENOUS
  Filled 2016-11-02: qty 30

## 2016-11-02 MED ORDER — FAMOTIDINE IN NACL 20-0.9 MG/50ML-% IV SOLN
INTRAVENOUS | Status: AC
  Filled 2016-11-02: qty 50

## 2016-11-02 NOTE — Telephone Encounter (Signed)
Per 1/5 LOS I have scheduled appts and notified the scheduelr 

## 2016-11-02 NOTE — Progress Notes (Signed)
1416: pt reports feeling light headed and that this feeling happens intermittently at home. Pt had a good lunch and reports that he did not take insulin prior to lunch. Orthostatic b/p obtained per Dr. Burr Medico orders. 1430: Pt stable and okay to discharge per Dr. Burr Medico. Pt educated to call clinic if symptoms worsens and to monitor blood sugars at home. Pt verbalizes understanding. Pt stable at discharge.

## 2016-11-02 NOTE — Patient Instructions (Signed)

## 2016-11-02 NOTE — Patient Instructions (Signed)
[facility] Discharge Instructions for Patients Receiving Chemotherapy  Today you received the following chemotherapy agents Taxol and Carboplatin. To help prevent nausea and vomiting after your treatment, we encourage you to take your nausea medication as directed.  If you develop nausea and vomiting that is not controlled by your nausea medication, call the clinic.   BELOW ARE SYMPTOMS THAT SHOULD BE REPORTED IMMEDIATELY:  *FEVER GREATER THAN 100.5 F  *CHILLS WITH OR WITHOUT FEVER  NAUSEA AND VOMITING THAT IS NOT CONTROLLED WITH YOUR NAUSEA MEDICATION  *UNUSUAL SHORTNESS OF BREATH  *UNUSUAL BRUISING OR BLEEDING  TENDERNESS IN MOUTH AND THROAT WITH OR WITHOUT PRESENCE OF ULCERS  *URINARY PROBLEMS  *BOWEL PROBLEMS  UNUSUAL RASH Items with * indicate a potential emergency and should be followed up as soon as possible.  Feel free to call the clinic you have any questions or concerns. The clinic phone number is [phone].  Please show the CHEMO ALERT CARD at check-in to the Emergency Department and triage nurse.    

## 2016-11-02 NOTE — Telephone Encounter (Signed)
Appointments scheduled per 1/5 LOS. Patient given AVS report and calendars with future scheduled appointments. °

## 2016-11-03 ENCOUNTER — Encounter: Payer: Self-pay | Admitting: Family Medicine

## 2016-11-03 ENCOUNTER — Other Ambulatory Visit: Payer: Self-pay | Admitting: Family Medicine

## 2016-11-07 DIAGNOSIS — J449 Chronic obstructive pulmonary disease, unspecified: Secondary | ICD-10-CM | POA: Diagnosis not present

## 2016-11-22 NOTE — Progress Notes (Signed)
[name]  [phone] [phone]  Clinic Follow up Note   Patient Care Team: Tammi Sou, MD as PCP - General (Family Medicine) Ulyses Southward, MD as Consulting Physician (Allergy and Immunology) Calton Dach, MD as Consulting Physician (Optometry) Sueanne Margarita, MD as Consulting Physician (Cardiology) Jerene Bears, MD as Consulting Physician (Gastroenterology) Latanya Maudlin, MD as Consulting Physician (Orthopedic Surgery) Jodi Marble, MD as Consulting Physician (Otolaryngology) Truitt Merle, MD as Consulting Physician (Hematology) Kyung Rudd, MD as Consulting Physician (Radiation Oncology) 11/23/2016    CHIEF COMPLAINTS:  Follow up GE junction cancer  Oncology History   Cancer of cardio-esophageal junction [facility])   Staging form: Stomach, AJCC 7th Edition     Clinical stage from 05/04/2016: Stage IV ([place], N2, M1) - Signed by Truitt Merle, MD on 05/18/2016        Cancer of cardio-esophageal junction ([place])   05/04/2016 Initial Diagnosis    Cancer of cardio-esophageal junction ([place])      05/04/2016 Procedure    EGD showed a large ulcerating mass with no active bleeding at the gastroesophageal junction extending into the gastric cardia. The mass was not obstructing and partially circumferential, extends approximately 5 cm. Nonbleeding erosive gastropathy.      05/04/2016 Initial Biopsy    Esophageal gastric junction biopsy showed a poorly differentiated carcinoma underlying the squamous mucosa. There is lymphovascular invasion. No Intestinal metaplasia. IHC weakly positive CK5/6, p63 (-), favor squamous.        05/09/2016 Imaging    CT CAP w contrast showed mild wall thickening involving the distal esophagus and proximal stomach compatible with known cancer, enlarged mediastinal and upper abdominal lymph nodes are highly suspicious for metastatic adenopathy, propable liver cirrhosis.      06/04/2016 - 06/22/2016 Radiation Therapy   palliative radiation to esophageal cancer       07/06/2016 -  Chemotherapy    Weekly carbo and taxol      07/30/2016 Pathology Results    PD-L1 negative expression      08/28/2016 - 08/31/2016 Hospital Admission    The patient was admitted to 4 severe hypoglycemia, dehydration, and acute renal failure. Infection workup was negative. He recovered well with supportive care. His insulin and hypertension medication was held on discharge, except insulin sliding scale.      09/24/2016 Imaging    CT CAP W CONTRAST 09/26/2016 IMPRESSION: Decreased masslike soft tissue prominence at gastroesophageal junction, consistent with decreased size of primary gastroesophageal junction carcinoma. Resolution of paraesophageal and gastrohepatic ligament lymphadenopathy since prior exam. Other sub-cm mediastinal lymph nodes show little or no significant change. Stable indeterminate sub-cm low-attenuation lesion in the liver dome. Probable hepatic cirrhosis. Recommend continued attention on follow-up CT. No new or progressive metastatic disease identified. Cholelithiasis.  No radiographic evidence of cholecystitis. Colonic diverticulosis. No radiographic evidence of diverticulitis. Aortic atherosclerosis and three-vessel coronary artery calcification.       GE junction carcinoma ([place])    HISTORY OF PRESENTING ILLNESS:  [name] 71 y.o. male is here because of His reason that diagnosed GE junction carcinoma. He is a comment of by his wife and daughter to our multidisciplinary chart clinic today.  He has been having progressive dysphagia and odynophagia for 2 month, he has been eating soft diet only in the past one week. He has pain in the mid chest and epigastric area only when he eats, with burning sensation, no nausea, abdominal bloating, or other discomfort. His appetite has remained  well, no weight loss,   He has multiple medical problems, especially heart failure, he has been on oxygen  continuously for 5-6 years. He is morbidly obese, has diabetes, hypertension, mild neuropathy, OSA etc. He has very sedentary life style, he sits in the chair and watch TV most of time during the day. He only comes out for shopping for a few times a week. He uses a Barr[employer], he can walk for a few hundrends feet before he has to stop to catch his breasts. He denies cough or sputum production, no GI discomfort, he has been having mild dirrhea, loose stool, twice daily, no melena or hematochezia.  CURRENT THERAPY: chemotherapy with weekly carboplatin and taxol,  started on 07/06/2016, held 08/21/16-09/28/2016 due to hospitalization, changed to 2 weeks on and 2 weeks off after that   INTERIM HISTORY: [name] returns for follow-up. Accompanied by his daughter-in-law. He is doing well overall. He tolerated the chemotherapy very well last cycle, no significant side effects. He denies any significant pain, or other new symptoms. His appetite has been good, no dysphagia or odynophagia. He is not physically active at home, spends most time sitting and watching TV at home.   MEDICAL HISTORY:  Past Medical History:  Diagnosis Date  . Asthma    as a child  . Cataract    multiple types, bilateral  . Chronic diastolic CHF (congestive heart failure) ([place]) 02/25/2009  . Chronic renal insufficiency, stage III (moderate) 2017   Stage II/III (GFR around 60)  . 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 COPD (chronic obstructive pulmonary disease) ([place])   . Coronary artery disease    chronically occluded LAD and diagonal with right to left collaterals, mild disease in the left circ and moderate disease in the mid RCA on medical management with Imdur, ASA, and Plavix.  . Dermatitis 05/2014  . DIABETES MELLITUS, TYPE II 06/24/2007   No diab retpthy as of 08/05/15 eye exam.  . Esophageal cancer ([place]) 04/2016   poorly differentiated carcinoma (Dr.  Pyrtle--EGD).  CT C/A/P showed metastatic adenopathy in mediastinum and upper abdomen 05/09/16.  Tx plan is palliative radiation (completed 06/22/16), then palliative systemic chemotherapy (carbo+taxol) was started but as of 08/03/16 onc f/u this was held due to severe knee and ankle arthralgias.  Restarting as of 10/2015 (08/2016 CT showed some disease regression  . Essential hypertension 05/01/2007   Qualifier: Diagnosis of  By: Tiney Rouge CMA, Ellison Hughs     . History of kidney stones   . Hyperkalemia 12/2015   Decreased ACE-I by 50% in response, then potassium normalized.  Marland Kitchen HYPERLIPIDEMIA 06/24/2007  . HYPERTENSION 05/01/2007  . Hypoxemia 01/27/2010  . Myocardial infarction    pt states he was informed per MD that he has had one but pt was unaware   . OBESITY 09/02/2008  . Obesity hypoventilation syndrome (HCC)    oxygen 24/7 (2 liters [place] as of 02/2016)  . On home oxygen therapy    Oxygen @ 2l/m nasally 24/7 hours  . OSA (obstructive sleep apnea)    not tested; pt scored 4 per stop bang tool results sent to PCP   . OSTEOARTHRITIS 05/01/2007  . Presbycusis of both ears 05/2015   [facility]  . Pruritic condition 05/2014   Allergist summer 2015, no new testing.  [name] field defect 05/10/16   Per Dr. Melina Fiddler, O.D.: Rt, loss of inf/temp quad and  some loss sup/temp.  ?CVA  ? Pituitary tumor? ?Brain met.    SURGICAL HISTORY: Past Surgical History:  Procedure Laterality Date  . APPENDECTOMY    . BALLOON DILATION N/A 05/04/2016   Procedure: BALLOON DILATION;  Surgeon: Jerene Bears, MD;  Location: [facility];  Service: Gastroenterology;  Laterality: N/A;  . CARDIAC CATHETERIZATION    . CARPAL TUNNEL RELEASE Left   . CATARACT EXTRACTION W/PHACO Right 04/29/2013   Procedure: CATARACT EXTRACTION PHACO AND INTRAOCULAR LENS PLACEMENT (IOC);  Surgeon: Adonis Brook, MD;  Location: [facility];  Service: Ophthalmology;  Laterality: Right;  . CATARACT EXTRACTION, BILATERAL    . COLONOSCOPY W/ POLYPECTOMY  08/2011   Many  polyps--all hyperplastic, severe diverticulosis, int hem.  [employer] hemoccult testing via [facility] 06/22/15 NEG  . ESOPHAGOGASTRODUODENOSCOPY N/A 05/04/2016   Procedure: ESOPHAGOGASTRODUODENOSCOPY (EGD);  Surgeon: Jerene Bears, MD;  Location: [facility];  Service: Gastroenterology;  Laterality: N/A;  . IR GENERIC HISTORICAL  10/03/2016   IR US GUIDE VASC ACCESS RIGHT 10/03/2016 Greggory Keen, MD [facility]  . IR GENERIC HISTORICAL  10/03/2016   IR FLUORO GUIDE PORT INSERTION RIGHT 10/03/2016 Greggory Keen, MD [facility]  . KNEE ARTHROSCOPY WITH MEDIAL MENISECTOMY Right 12/16/2014   Procedure: RIGHT KNEE ARTHROSCOPY WITH MEDIAL MENISECTOMY microfracture medial femoral condyle abrasion condroplasty medial femoral condyle lateral menisectomy;  Surgeon: Tobi Bastos, MD;  Location: [facility];  Service: Orthopedics;  Laterality: Right;  . LEFT HEART CATHETERIZATION WITH CORONARY ANGIOGRAM N/A 10/26/2013   Procedure: LEFT HEART CATHETERIZATION WITH CORONARY ANGIOGRAM;  Surgeon: Jettie Booze, MD;  Location: [facility] CATH LAB;  Service: Cardiovascular;  Laterality: N/A;  . LUMBAR [place] SURGERY  2001  . SHOULDER SURGERY Right    right fx  . TONSILLECTOMY    . TRANSTHORACIC ECHOCARDIOGRAM  03/2016   EF 55-60%, grade I DD, LAE  . WRIST SURGERY Right    right fx    SOCIAL HISTORY: Social History   Social History  . Marital status: Married    Spouse name: susan  . Number of children: 2  . Years of education: N/A   Occupational History  . Retired    Social History Main Topics  . Smoking status: Former Smoker    Packs/day: 1.50    Years: 30.00    Types: Cigarettes, Pipe, Cigars    Quit date: 10/30/1983  . Smokeless tobacco: Former Systems developer    Types: Chew    Quit date: 05/15/2016  . Alcohol use No  . Drug use: No  . Sexual activity: Not Currently   Other Topics Concern  . Not on file   Social History Narrative   Married, one son and one daughter.   His daughter and her two children live with  him.   Coffee daily.  Former smoker.  No alcohol.   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   Attends church weekly-   Oxygen continuous       FAMILY HISTORY: Family History  Problem Relation Age of Onset  . Heart attack Mother   . Aneurysm Father   . Alcohol abuse Father   . Diabetes Maternal Aunt   . Colon cancer Neg Hx     ALLERGIES:  has No Known Allergies.  MEDICATIONS:  Current Outpatient Prescriptions  Medication Sig Dispense Refill  . aspirin 81 MG tablet Take 81 mg by mouth daily.      Marland Kitchen atorvastatin (LIPITOR) 80 MG tablet Take 1 tablet (80  mg total) by mouth daily. 90 tablet 3  . beta carotene w/minerals (OCUVITE) tablet Take 1 tablet by mouth daily.    . cetirizine (ZYRTEC) 10 MG tablet Take 10 mg by mouth daily.    . citalopram (CELEXA) 20 MG tablet Take 1 tablet (20 mg total) by mouth daily. 90 tablet 3  . clonazePAM (KLONOPIN) 1 MG tablet TAKE ONE TABLET BY MOUTH TWICE DAILY AS NEEDED FOR ANXIETY 60 tablet 5  . cromolyn (OPTICROM) 4 % ophthalmic solution Place 1 drop into both eyes 4 (four) times daily.    Marland Kitchen gabapentin (NEURONTIN) 300 MG capsule TAKE ONE CAPSULE BY MOUTH THREE TIMES DAILY 270 capsule 3  . hydrOXYzine (ATARAX/VISTARIL) 25 MG tablet TAKE 1 TABLET BY MOUTH AT SUPPER AND 1 TABLET BY MOUTH AT BEDTIME FOR INSOMNIA 60 tablet 6  . insulin lispro (HUMALOG KWIKPEN) 100 UNIT/ML KiwkPen Inject into the skin. SS at meal time    . Insulin NPH, Human,, Isophane, (HUMULIN N) 100 UNIT/ML Kiwkpen Inject into the skin. 7 units every morning and 7 units every evening    . magnesium chloride (SLOW-MAG) 64 MG TBEC SR tablet Take 1 tablet (64 mg total) by mouth daily. 60 tablet 0  . metFORMIN (GLUCOPHAGE) 1000 MG tablet TAKE ONE TABLET BY MOUTH TWICE DAILY WITH MEALS 180 tablet 1  . Omega-3 Fatty Acids ([employer] FISH OIL) 1000 MG CAPS Take 2 tablets by mouth 2 (two) times daily. 90 capsule   . ondansetron (ZOFRAN) 8 MG tablet Take 1 tablet (8 mg  total) by mouth 2 (two) times daily as needed for refractory nausea / vomiting. Start on day 3 after chemo. 30 tablet 1  . oxyCODONE (OXY IR/ROXICODONE) 5 MG immediate release tablet Take 1 tablet (5 mg total) by mouth every 6 (six) hours as needed for severe pain. 60 tablet 0  . OXYGEN Inhale 2 L/min into the lungs continuous.    . pantoprazole (PROTONIX) 40 MG tablet TAKE ONE TABLET BY MOUTH TWICE DAILY 60 tablet 3  . prochlorperazine (COMPAZINE) 10 MG tablet Take 1 tablet (10 mg total) by mouth every 6 (six) hours as needed (Nausea or vomiting). 30 tablet 1  . ZETIA 10 MG tablet TAKE ONE TABLET BY MOUTH ONCE DAILY 90 tablet 1   No current facility-administered medications for this visit.     REVIEW OF SYSTEMS:   Constitutional: Denies fevers, chills or abnormal night sweats   Eyes: Denies blurriness of vision, double vision or watery eyes Ears, nose, mouth, throat, and face: Denies mucositis or sore throat Respiratory: Denies cough, dyspnea or wheezes Cardiovascular: Denies palpitation, chest discomfort or lower extremity swelling Gastrointestinal:  Denies nausea, heartburn or change in bowel habits Skin: Denies abnormal skin rashes Lymphatics: Denies new lymphadenopathy or easy bruising Neurological:Denies numbness, tingling or new weaknesses (+) leg weakness, slowly improving with physical therapy Behavioral/Psych: Mood is stable, no new changes  All other systems were reviewed with the patient and are negative.  PHYSICAL EXAMINATION: ECOG PERFORMANCE STATUS: 3 - Symptomatic, >50% confined to bed  Vitals:   11/23/16 1115  BP: (!) 141/65  Pulse: (!) 101  Resp: 17  Temp: 98 F (36.7 C)   Filed Weights   11/23/16 1115  Weight: 268 lb 14.4 oz (122 kg)    GENERAL:alert, no distress and comfortable, morbid obese, sitting in wheelchair with nasal cannula oxygen  SKIN: skin color, texture, turgor are normal, no rashes or significant lesions EYES: normal, conjunctiva are pink and  non-injected, sclera clear  OROPHARYNX:no exudate, no erythema and lips, buccal mucosa, and tongue normal  NECK: supple, thyroid normal size, non-tender, without nodularity LYMPH:  no palpable lymphadenopathy in the cervical, axillary or inguinal LUNGS: clear, no wheezing HEART: regular rate & rhythm and no murmurs and no lower extremity edema ABDOMEN:abdomen soft, non-tender and normal bowel sounds Musculoskeletal:no cyanosis of digits and no clubbing  PSYCH: alert & oriented x 3 with fluent speech NEURO: no focal motor/sensory deficits EXT: Trace edema, he wears compression stocks, no significant ankle swollen, erythema, or warmness.  LABORATORY DATA:  I have reviewed the data as listed CBC Latest Ref Rng & Units 11/23/2016 11/02/2016 10/26/2016  WBC 4.0 - 10.3 10e3/uL 4.2 3.9(L) 4.4  Hemoglobin 13.0 - 17.1 g/dL 10.2(L) 9.6(L) 9.8(L)  Hematocrit 38.4 - 49.9 % 31.8(L) 29.4(L) 30.6(L)  Platelets 140 - 400 10e3/uL 127(L) 149 157   CMP Latest Ref Rng & Units 11/23/2016 11/02/2016 10/26/2016  Glucose 70 - 140 mg/dl 143(H) 111 115  BUN 7.0 - 26.0 mg/dL 11.5 10.2 8.2  Creatinine 0.7 - 1.3 mg/dL 0.8 0.7 0.8  Sodium 136 - 145 mEq/L 140 139 141  Potassium 3.5 - 5.1 mEq/L 4.0 4.0 4.1  Chloride 96 - 112 mEq/L - - -  CO2 22 - 29 mEq/L _0 Calcium 8.4 - 10.4 mg/dL 8.7 8.9 8.8  Total Protein 6.4 - 8.3 g/dL 6.1(L) 6.4 6.2(L)  Total Bilirubin 0.20 - 1.20 mg/dL 0.59 0.50 0.48  Alkaline Phos 40 - 150 U/L 68 80 87  AST 5 - 34 U/L _1 ALT 0 - 55 U/L _2 PATHOLOGY REPORT Diagnosis 05/04/2016 1. Esophagogastric junction, biopsy, ulcerative mass - POORLY DIFFERENTIATED CARCINOMA, SEE COMMENT. 2. Stomach, biopsy - REACTIVE GASTROPATHY. - NEGATIVE FOR HELICOBACTER PYLORI. - NO INTESTINAL METAPLASIA, DYSPLASIA, OR MALIGNANCY. Microscopic Comment 1. The majority of the specimen consists of gastroesophageal mucosa with reflux changes. There is a small focus of tumor underlying the  squamous mucosa. There is lymphovascular invasion. There is no background intestinal metaplasia (Barrett's esophagus). Immunohistochemistry reveals only very focal weak cytokeratin 5/6, negative p63, and negative mucicarmine in the limited remaining tumor. Dr. Saralyn Pilar has reviewed the case. The case was called to Dr. Hilarie Fredrickson on 05/08/2016. 2. A Warthin-Starry stain is performed to determine the possibility of the presence of Helicobacter pylori. The Warthin-Starry stain is negative for organisms of Helicobacter pylori.     RADIOGRAPHIC STUDIES: I have personally reviewed the radiological images as listed and agreed with the findings in the report. No results found. EGD 05/04/2016 A large, ulcerating mass with no active bleeding and no stigmata of recent bleeding was found at the gastroesophageal junction extending into the gastric cardia, beginning 40 cm from the incisors. The mass was non-obstructing and partially circumferential (involving one-half of the lumen circumference). The mass extends approximately 5 cm. IMPRESSION:  -Likely malignant esophageal tumor was found at the gastroesophageal junction extending into the gastric cardia. Biopsied. - Non-bleeding erosive gastropathy. Biopsied. - Erythematous duodenopathy. - Normal second portion of the duodenum.  ASSESSMENT & PLAN: 70 y.o. Caucasian male, with multiple comorbidities, including hypertension, diabetes, OSA, coronary artery disease, diastolic CHF, on continuous oxygen, morbid obesity, presented with progressive dysphagia and odynophagia.  1. Cancer of cardio-esophageal junction, poorly differentiated, cTxN2M1, stage IV  -I previously reviewed his CT scan, EGD, and the biopsy results with patient and his family members in great details. -His biopsy pathology was reviewed in our tumor board 2 days ago, the  morphology and weakly positive for CK 5/6, fevers squamous cell carcinoma -His CT scan findings are very concerning for  distant metastasis to abdominal lymph nodes.  -I previously reviewed the PET scan images with patient and his daughter in person, he has hypermetabolic GE junction tumor, and diffuse adenopathy in mediastinum and upper abdomen. -We previously discussed the aggressive nature of esophageal cancer, an incurable nature of his disease due to the distant metastasis. The goal of therapy is palliative, to prolong his life and palliate his symptoms. -His tumor was negative for PD-L1, not a candidate for immunotherapy  -He is on first line palliative chemotherapy with weekly carbo and Taxol, 2 weeks on, 2 weeks off, tolerating better lately.-I previously reviewed his CT scan findings on 09/26/2016 show good partial response to chemoradiation, no new or progressive metastatic disease. -He is clinically doing well, tolerating treatment very well. Repeat CT scan in March 2008  2. CAD, diastolic CHF, on continuous oxygen -He will continue follow-up with his cardiologist Dr. Radford Pax -His Plavix has been held since the EGD and biopsy, he is on baby aspirin. -Need to watch his fluid intake during his chemotherapy, and also avoid fluid overload from chemo  -he is back on lasix, dsypnea improved   3. DM, HTN -We discussed his blood pressure and glucose need to be monitored closely during the chemotherapy, which may affect his blood glucose and blood pressure -He will continue medication, monitor his sugar and blood pressure at home, and follow-up with his primary care physician -will decrease premed dexa before taxol  -History simple has been held due to his borderline low blood pressure -He will see his primary care physician Dr. Anitra Lauth for diabetes management -High blood glucose 111 TODAY.  4. OSA, morbid obesity -We discussed healthy diet, I encouraged him to to be more physically active, and consider home PT -I strongly encouraged him to follow-up with dietitian during the therapy for nutrition  support.  5. Epigastric pain -Likely secondary to his underlying malignancy -worse after radiation, resolved now  -continue tramadol as need, he has not need it lately   6. Arthralgia -He has baseline osteoarthritis, not physically active -However his arthralgia in his knees and ankle are much worse after chemotherapy but resolved now  -He will use oxycodone 5 mg as needed, constipation management reviewed with him -Leg weakness improved with physical therapy. Encouraged the patient to continue with physical therapy.   7. Anemia -Second is to underlying malignancy and chemotherapy -Hemoglobin is 10.2 TODAY. -we'll consider blood transfusion if hemoglobin less than 8, for symptomatic anemia  8. Goal of care discussion  -We previously discussed the incurable nature of his cancer, and the overall poor prognosis, especially if he does not have good response to chemotherapy or progress on chemo -The patient understands the goal of care is palliative. -Patient is very realistic, understands his cancer is not curable, and he agrees with DO NOT RESUSCITATE and DO NOT INTUBATE  PLAN -He will receive chemo carboplatin and Taxol treatment today and next week, then off 2 weeks  -Lab, flush, f/u and weekly carbo and taxol on 2/20  -will order restaging CT scan on next visit   All questions were answered. The patient knows to call the clinic with any problems, questions or concerns.  I spent 20 minutes counseling the patient face to face. The total time spent in the appointment was 25 minutes and more than 35% was on counseling.    Truitt Merle, MD 11/23/2016

## 2016-11-23 ENCOUNTER — Encounter: Payer: Self-pay | Admitting: Hematology

## 2016-11-23 ENCOUNTER — Ambulatory Visit (HOSPITAL_BASED_OUTPATIENT_CLINIC_OR_DEPARTMENT_OTHER): Payer: Medicare Other | Admitting: Hematology

## 2016-11-23 ENCOUNTER — Ambulatory Visit: Payer: Medicare Other

## 2016-11-23 ENCOUNTER — Other Ambulatory Visit (HOSPITAL_BASED_OUTPATIENT_CLINIC_OR_DEPARTMENT_OTHER): Payer: Medicare Other

## 2016-11-23 ENCOUNTER — Telehealth: Payer: Self-pay | Admitting: *Deleted

## 2016-11-23 ENCOUNTER — Ambulatory Visit (HOSPITAL_BASED_OUTPATIENT_CLINIC_OR_DEPARTMENT_OTHER): Payer: Medicare Other

## 2016-11-23 VITALS — HR 82

## 2016-11-23 VITALS — BP 141/65 | HR 101 | Temp 98.0°F | Resp 17 | Ht 69.0 in | Wt 268.9 lb

## 2016-11-23 DIAGNOSIS — D630 Anemia in neoplastic disease: Secondary | ICD-10-CM

## 2016-11-23 DIAGNOSIS — C160 Malignant neoplasm of cardia: Secondary | ICD-10-CM | POA: Diagnosis not present

## 2016-11-23 DIAGNOSIS — Z95828 Presence of other vascular implants and grafts: Secondary | ICD-10-CM

## 2016-11-23 DIAGNOSIS — I10 Essential (primary) hypertension: Secondary | ICD-10-CM | POA: Diagnosis not present

## 2016-11-23 DIAGNOSIS — E119 Type 2 diabetes mellitus without complications: Secondary | ICD-10-CM

## 2016-11-23 DIAGNOSIS — I5032 Chronic diastolic (congestive) heart failure: Secondary | ICD-10-CM

## 2016-11-23 DIAGNOSIS — G893 Neoplasm related pain (acute) (chronic): Secondary | ICD-10-CM

## 2016-11-23 DIAGNOSIS — D6481 Anemia due to antineoplastic chemotherapy: Secondary | ICD-10-CM

## 2016-11-23 DIAGNOSIS — Z5111 Encounter for antineoplastic chemotherapy: Secondary | ICD-10-CM | POA: Diagnosis not present

## 2016-11-23 LAB — COMPREHENSIVE METABOLIC PANEL
ALT: 21 U/L (ref 0–55)
ANION GAP: 10 meq/L (ref 3–11)
AST: 28 U/L (ref 5–34)
Albumin: 3.1 g/dL — ABNORMAL LOW (ref 3.5–5.0)
Alkaline Phosphatase: 68 U/L (ref 40–150)
BUN: 11.5 mg/dL (ref 7.0–26.0)
CHLORIDE: 105 meq/L (ref 98–109)
CO2: 25 mEq/L (ref 22–29)
CREATININE: 0.8 mg/dL (ref 0.7–1.3)
Calcium: 8.7 mg/dL (ref 8.4–10.4)
EGFR: >90 mL/min/{1.73_m2} (ref >90)
Glucose: 143 mg/dl — ABNORMAL HIGH (ref 70–140)
Potassium: 4 mEq/L (ref 3.5–5.1)
Sodium: 140 mEq/L (ref 136–145)
Total Bilirubin: 0.59 mg/dL (ref 0.20–1.20)
Total Protein: 6.1 g/dL — ABNORMAL LOW (ref 6.4–8.3)

## 2016-11-23 LAB — CBC WITH DIFFERENTIAL/PLATELET
BASO%: 1.1 % (ref 0.0–2.0)
BASOS ABS: 0 10*3/uL (ref 0.0–0.1)
EOS%: 2.4 % (ref 0.0–7.0)
Eosinophils Absolute: 0.1 10*3/uL (ref 0.0–0.5)
HCT: 31.8 % — ABNORMAL LOW (ref 38.4–49.9)
HGB: 10.2 g/dL — ABNORMAL LOW (ref 13.0–17.1)
LYMPH%: 14.3 % (ref 14.0–49.0)
MCH: 28.8 pg (ref 27.2–33.4)
MCHC: 32.1 g/dL (ref 32.0–36.0)
MCV: 89.6 fL (ref 79.3–98.0)
MONO#: 0.7 10*3/uL (ref 0.1–0.9)
MONO%: 15.6 % — AB (ref 0.0–14.0)
NEUT#: 2.8 10*3/uL (ref 1.5–6.5)
NEUT%: 66.6 % (ref 39.0–75.0)
Platelets: 127 10*3/uL — ABNORMAL LOW (ref 140–400)
RBC: 3.54 10*6/uL — ABNORMAL LOW (ref 4.20–5.82)
RDW: 19.8 % — AB (ref 11.0–14.6)
WBC: 4.2 10*3/uL (ref 4.0–10.3)
lymph#: 0.6 10*3/uL — ABNORMAL LOW (ref 0.9–3.3)

## 2016-11-23 MED ORDER — PACLITAXEL CHEMO INJECTION 300 MG/50ML
70.0000 mg/m2 | Freq: Once | INTRAVENOUS | Status: AC
  Administered 2016-11-23: 180 mg via INTRAVENOUS
  Filled 2016-11-23: qty 30

## 2016-11-23 MED ORDER — PALONOSETRON HCL INJECTION 0.25 MG/5ML
INTRAVENOUS | Status: AC
  Filled 2016-11-23: qty 5

## 2016-11-23 MED ORDER — HEPARIN SOD (PORK) LOCK FLUSH 100 UNIT/ML IV SOLN
500.0000 [IU] | Freq: Once | INTRAVENOUS | Status: AC | PRN
  Administered 2016-11-23: 500 [IU]
  Filled 2016-11-23: qty 5

## 2016-11-23 MED ORDER — HEPARIN SOD (PORK) LOCK FLUSH 100 UNIT/ML IV SOLN
500.0000 [IU] | Freq: Once | INTRAVENOUS | Status: DC | PRN
  Filled 2016-11-23: qty 5

## 2016-11-23 MED ORDER — SODIUM CHLORIDE 0.9% FLUSH
10.0000 mL | INTRAVENOUS | Status: DC | PRN
Start: 2016-11-23 — End: 2016-11-23
  Administered 2016-11-23: 10 mL
  Filled 2016-11-23: qty 10

## 2016-11-23 MED ORDER — DEXAMETHASONE SODIUM PHOSPHATE 10 MG/ML IJ SOLN
INTRAMUSCULAR | Status: AC
  Filled 2016-11-23: qty 1

## 2016-11-23 MED ORDER — SODIUM CHLORIDE 0.9 % IV SOLN
Freq: Once | INTRAVENOUS | Status: AC
  Administered 2016-11-23: 13:00:00 via INTRAVENOUS

## 2016-11-23 MED ORDER — DIPHENHYDRAMINE HCL 50 MG/ML IJ SOLN
25.0000 mg | Freq: Once | INTRAMUSCULAR | Status: AC
  Administered 2016-11-23: 25 mg via INTRAVENOUS

## 2016-11-23 MED ORDER — DIPHENHYDRAMINE HCL 50 MG/ML IJ SOLN
INTRAMUSCULAR | Status: AC
  Filled 2016-11-23: qty 1

## 2016-11-23 MED ORDER — FAMOTIDINE IN NACL 20-0.9 MG/50ML-% IV SOLN
20.0000 mg | Freq: Once | INTRAVENOUS | Status: AC
  Administered 2016-11-23: 20 mg via INTRAVENOUS

## 2016-11-23 MED ORDER — FAMOTIDINE IN NACL 20-0.9 MG/50ML-% IV SOLN
INTRAVENOUS | Status: AC
  Filled 2016-11-23: qty 50

## 2016-11-23 MED ORDER — SODIUM CHLORIDE 0.9 % IV SOLN
300.0000 mg | Freq: Once | INTRAVENOUS | Status: AC
  Administered 2016-11-23: 300 mg via INTRAVENOUS
  Filled 2016-11-23: qty 30

## 2016-11-23 MED ORDER — DEXAMETHASONE SODIUM PHOSPHATE 10 MG/ML IJ SOLN
10.0000 mg | Freq: Once | INTRAMUSCULAR | Status: AC
  Administered 2016-11-23: 10 mg via INTRAVENOUS

## 2016-11-23 MED ORDER — PALONOSETRON HCL INJECTION 0.25 MG/5ML
0.2500 mg | Freq: Once | INTRAVENOUS | Status: AC
  Administered 2016-11-23: 0.25 mg via INTRAVENOUS

## 2016-11-23 MED ORDER — SODIUM CHLORIDE 0.9% FLUSH
10.0000 mL | INTRAVENOUS | Status: DC | PRN
  Administered 2016-11-23: 10 mL via INTRAVENOUS
  Filled 2016-11-23: qty 10

## 2016-11-23 NOTE — Addendum Note (Signed)
Addended by: Truitt Merle on: 11/23/2016 12:32 PM   Modules accepted: Orders

## 2016-11-23 NOTE — Patient Instructions (Signed)

## 2016-11-23 NOTE — Telephone Encounter (Signed)
Per 1/26 LOS and staff message I have scheduled appts and gave calendar

## 2016-11-23 NOTE — Patient Instructions (Signed)
[facility] Discharge Instructions for Patients Receiving Chemotherapy  Today you received the following chemotherapy agents Taxol and Carboplatin  To help prevent nausea and vomiting after your treatment, we encourage you to take your nausea medication as directed. No Zofran for 3 days. Take Compazine instead.   If you develop nausea and vomiting that is not controlled by your nausea medication, call the clinic.   BELOW ARE SYMPTOMS THAT SHOULD BE REPORTED IMMEDIATELY:  *FEVER GREATER THAN 100.5 F  *CHILLS WITH OR WITHOUT FEVER  NAUSEA AND VOMITING THAT IS NOT CONTROLLED WITH YOUR NAUSEA MEDICATION  *UNUSUAL SHORTNESS OF BREATH  *UNUSUAL BRUISING OR BLEEDING  TENDERNESS IN MOUTH AND THROAT WITH OR WITHOUT PRESENCE OF ULCERS  *URINARY PROBLEMS  *BOWEL PROBLEMS  UNUSUAL RASH Items with * indicate a potential emergency and should be followed up as soon as possible.  Feel free to call the clinic you have any questions or concerns. The clinic phone number is [phone].  Please show the CHEMO ALERT CARD at check-in to the Emergency Department and triage nurse.   

## 2016-11-26 ENCOUNTER — Telehealth: Payer: Self-pay | Admitting: Cardiology

## 2016-11-26 NOTE — Telephone Encounter (Signed)
New message       Pt has an appt on wed.  Should he come in early for labs?

## 2016-11-26 NOTE — Telephone Encounter (Signed)
Instructed patient to come fasting to appointment and all labs will be drawn after Dr. Radford Pax evaluates him. He agrees with treatment plan and was grateful for call.

## 2016-11-27 ENCOUNTER — Telehealth: Payer: Self-pay | Admitting: Family Medicine

## 2016-11-27 ENCOUNTER — Encounter: Payer: Self-pay | Admitting: Family Medicine

## 2016-11-27 NOTE — Telephone Encounter (Signed)
Pt advised and voiced understanding. He stated that his wife has an apt next week and will pick up letter then. Letter put up front for p/u.

## 2016-11-27 NOTE — Telephone Encounter (Signed)
Patient would like a letter to get out of jury duty due to his chronic illnesses.  Patient is due to serve [employer] 01/15/17,  Letter excusing patient must be turned in ten day prior to serve date.  Please advise.

## 2016-11-27 NOTE — Telephone Encounter (Signed)
Letter printed.

## 2016-11-27 NOTE — Telephone Encounter (Signed)
Please advise. Thanks.  

## 2016-11-28 ENCOUNTER — Ambulatory Visit (INDEPENDENT_AMBULATORY_CARE_PROVIDER_SITE_OTHER): Payer: Medicare Other | Admitting: Cardiology

## 2016-11-28 ENCOUNTER — Encounter: Payer: Self-pay | Admitting: Cardiology

## 2016-11-28 VITALS — BP 110/60 | HR 104 | Ht 69.0 in | Wt 266.0 lb

## 2016-11-28 DIAGNOSIS — I5032 Chronic diastolic (congestive) heart failure: Secondary | ICD-10-CM | POA: Diagnosis not present

## 2016-11-28 DIAGNOSIS — I10 Essential (primary) hypertension: Secondary | ICD-10-CM

## 2016-11-28 DIAGNOSIS — I2510 Atherosclerotic heart disease of native coronary artery without angina pectoris: Secondary | ICD-10-CM

## 2016-11-28 DIAGNOSIS — E785 Hyperlipidemia, unspecified: Secondary | ICD-10-CM | POA: Diagnosis not present

## 2016-11-28 LAB — LIPID PANEL
CHOLESTEROL TOTAL: 96 mg/dL — AB (ref 100–199)
Chol/HDL Ratio: 2.6 ratio units (ref 0.0–5.0)
HDL: 37 mg/dL — ABNORMAL LOW (ref >39)
LDL CALC: 25 mg/dL (ref 0–99)
Triglycerides: 168 mg/dL — ABNORMAL HIGH (ref 0–149)
VLDL Cholesterol Cal: 34 mg/dL (ref 5–40)

## 2016-11-28 LAB — BASIC METABOLIC PANEL
BUN / CREAT RATIO: 17 (ref 10–24)
BUN: 11 mg/dL (ref 8–27)
CO2: 27 mmol/L (ref 18–29)
CREATININE: 0.64 mg/dL — AB (ref 0.76–1.27)
Calcium: 8.6 mg/dL (ref 8.6–10.2)
Chloride: 97 mmol/L (ref 96–106)
GFR calc Af Amer: 114 mL/min/{1.73_m2} (ref >59)
GFR calc non Af Amer: 99 mL/min/{1.73_m2} (ref >59)
GLUCOSE: 101 mg/dL — AB (ref 65–99)
Potassium: 4.3 mmol/L (ref 3.5–5.2)
Sodium: 138 mmol/L (ref 134–144)

## 2016-11-28 LAB — HEPATIC FUNCTION PANEL
ALBUMIN: 3.5 g/dL (ref 3.5–4.8)
ALT: 23 IU/L (ref 0–44)
AST: 25 IU/L (ref 0–40)
Alkaline Phosphatase: 65 IU/L (ref 39–117)
BILIRUBIN, DIRECT: 0.19 mg/dL (ref 0.00–0.40)
Bilirubin Total: 0.7 mg/dL (ref 0.0–1.2)
Total Protein: 5.9 g/dL — ABNORMAL LOW (ref 6.0–8.5)

## 2016-11-28 NOTE — Progress Notes (Signed)
Cardiology Office Note    Date:  11/28/2016   ID:  ZENITH LAMPHIER, DOB 26-Jan-1946, MRN [id number]  PCP:  Tammi Sou, MD  Cardiologist:  Fransico Him, MD   Chief Complaint  Patient presents with  . Coronary Artery Disease  . Hypertension  . Hyperlipidemia    History of Present Illness:  [name] is a 71 y.o. male with a history of obesity hypoventilation syndrome on home O2, HTN, dyslipidemia, morbid obesity and chronic LE edema which is controlled with TED hose stockings and diuretic. He has chronic diastolic CHF controlled on diuretics. He has ASCAD with severely diseased and chronically occluded LAD and diagonal with right to left collaterals, mild disease in the left circ and moderate disease in the mid RCA on medical management with Imdur and Plavix. He presents back today for followup. Since I saw him last he was diagnosed with adenoCA of the GE junction and has had chemo.  He was admitted in November with dehydration after chemo.   His nitrate was stopped as well as diuretic.  He is also off BP meds.   He has not had any CP after coming off of his Imdur.  He has chronic SOB related to his underlying obesity hypoventilation and is on home O2 and thinks his DOE is at baseline. He has chronic LE edema but since going on chemo and wearing compression hose, he has not noticed any further swelling. He denies any palpitations or syncope.  He has had some dizziness when going from sitting to standing and starts to staggar.  He has not fallen.   Past Medical History:  Diagnosis Date  . Asthma    as a child  . Cataract    multiple types, bilateral  . Chronic diastolic CHF (congestive heart failure) ([place]) 02/25/2009  . Chronic renal insufficiency, stage III (moderate) 2017   Stage II/III (GFR around 60)  . 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 COPD (chronic obstructive pulmonary disease) ([place])   .  Coronary artery disease    chronically occluded LAD and diagonal with right to left collaterals, mild disease in the left circ and moderate disease in the mid RCA on medical management with Imdur, ASA, and Plavix.  . Dermatitis 05/2014  . DIABETES MELLITUS, TYPE II 06/24/2007   No diab retpthy as of 08/05/15 eye exam.  . 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 Pyrtle--EGD).  CT C/A/P showed metastatic adenopathy in mediastinum and upper abdomen 05/09/16.  Tx plan is palliative radiation (completed 06/22/16), then palliative systemic chemotherapy (carbo+taxol) was started but as of 08/03/16 onc f/u this was held due to severe knee and ankle arthralgias.  Restarting as of 10/2015 (08/2016 CT showed some disease regression  . Essential hypertension 05/01/2007   Qualifier: Diagnosis of  By: Tiney Rouge CMA, Ellison Hughs     . History of kidney stones   . Hyperkalemia 12/2015   Decreased ACE-I by 50% in response, then potassium normalized.  Marland Kitchen HYPERLIPIDEMIA 06/24/2007  . Hypoxemia 01/27/2010  . Myocardial infarction    pt states he was informed per MD that he has had one but pt was unaware   . OBESITY 09/02/2008  . Obesity hypoventilation syndrome (HCC)    oxygen 24/7 (2 liters [place] as of 02/2016)  . On home oxygen therapy    Oxygen @ 2l/m nasally 24/7 hours  . OSA (obstructive sleep apnea)  not tested; pt scored 4 per stop bang tool results sent to PCP   . OSTEOARTHRITIS 05/01/2007  . Presbycusis of both ears 05/2015   [facility]  . Pruritic condition 05/2014   Allergist summer 2015, no new testing.  [name] field defect 05/10/16   Per Dr. Melina Fiddler, O.D.: Rt, loss of inf/temp quad and some loss sup/temp.  ?CVA  ? Pituitary tumor? ?Brain met.    Past Surgical History:  Procedure Laterality Date  . APPENDECTOMY    . BALLOON DILATION N/A 05/04/2016   Procedure: BALLOON DILATION;  Surgeon: Jerene Bears, MD;  Location: [facility];  Service: Gastroenterology;  Laterality: N/A;  . CARDIAC  CATHETERIZATION    . CARPAL TUNNEL RELEASE Left   . CATARACT EXTRACTION W/PHACO Right 04/29/2013   Procedure: CATARACT EXTRACTION PHACO AND INTRAOCULAR LENS PLACEMENT (IOC);  Surgeon: Adonis Brook, MD;  Location: [facility];  Service: Ophthalmology;  Laterality: Right;  . CATARACT EXTRACTION, BILATERAL    . COLONOSCOPY W/ POLYPECTOMY  08/2011   Many polyps--all hyperplastic, severe diverticulosis, int hem.  [employer] hemoccult testing via [facility] 06/22/15 NEG  . ESOPHAGOGASTRODUODENOSCOPY N/A 05/04/2016   Procedure: ESOPHAGOGASTRODUODENOSCOPY (EGD);  Surgeon: Jerene Bears, MD;  Location: [facility];  Service: Gastroenterology;  Laterality: N/A;  . IR GENERIC HISTORICAL  10/03/2016   IR US GUIDE VASC ACCESS RIGHT 10/03/2016 Greggory Keen, MD [facility]  . IR GENERIC HISTORICAL  10/03/2016   IR FLUORO GUIDE PORT INSERTION RIGHT 10/03/2016 Greggory Keen, MD [facility]  . KNEE ARTHROSCOPY WITH MEDIAL MENISECTOMY Right 12/16/2014   Procedure: RIGHT KNEE ARTHROSCOPY WITH MEDIAL MENISECTOMY microfracture medial femoral condyle abrasion condroplasty medial femoral condyle lateral menisectomy;  Surgeon: Tobi Bastos, MD;  Location: [facility];  Service: Orthopedics;  Laterality: Right;  . LEFT HEART CATHETERIZATION WITH CORONARY ANGIOGRAM N/A 10/26/2013   Procedure: LEFT HEART CATHETERIZATION WITH CORONARY ANGIOGRAM;  Surgeon: Jettie Booze, MD;  Location: [facility] CATH LAB;  Service: Cardiovascular;  Laterality: N/A;  . LUMBAR [place] SURGERY  2001  . SHOULDER SURGERY Right    right fx  . TONSILLECTOMY    . TRANSTHORACIC ECHOCARDIOGRAM  03/2016   EF 55-60%, grade I DD, LAE  . WRIST SURGERY Right    right fx    Current Medications: Outpatient Medications Prior to Visit  Medication Sig Dispense Refill  . aspirin 81 MG tablet Take 81 mg by mouth daily.      Marland Kitchen atorvastatin (LIPITOR) 80 MG tablet Take 1 tablet (80 mg total) by mouth daily. 90 tablet 3  . beta carotene w/minerals (OCUVITE) tablet Take 1 tablet  by mouth daily.    . cetirizine (ZYRTEC) 10 MG tablet Take 10 mg by mouth daily.    . citalopram (CELEXA) 20 MG tablet Take 1 tablet (20 mg total) by mouth daily. 90 tablet 3  . clonazePAM (KLONOPIN) 1 MG tablet TAKE ONE TABLET BY MOUTH TWICE DAILY AS NEEDED FOR ANXIETY 60 tablet 5  . cromolyn (OPTICROM) 4 % ophthalmic solution Place 1 drop into both eyes 4 (four) times daily.    Marland Kitchen gabapentin (NEURONTIN) 300 MG capsule TAKE ONE CAPSULE BY MOUTH THREE TIMES DAILY 270 capsule 3  . hydrOXYzine (ATARAX/VISTARIL) 25 MG tablet TAKE 1 TABLET BY MOUTH AT SUPPER AND 1 TABLET BY MOUTH AT BEDTIME FOR INSOMNIA 60 tablet 6  . insulin lispro (HUMALOG KWIKPEN) 100 UNIT/ML KiwkPen Inject into the skin. SS at meal time    . Insulin NPH, Human,, Isophane, (HUMULIN N) 100  UNIT/ML Kiwkpen Inject into the skin. 7 units every morning and 7 units every evening    . magnesium chloride (SLOW-MAG) 64 MG TBEC SR tablet Take 1 tablet (64 mg total) by mouth daily. 60 tablet 0  . metFORMIN (GLUCOPHAGE) 1000 MG tablet TAKE ONE TABLET BY MOUTH TWICE DAILY WITH MEALS 180 tablet 1  . Omega-3 Fatty Acids ([employer] FISH OIL) 1000 MG CAPS Take 2 tablets by mouth 2 (two) times daily. 90 capsule   . ondansetron (ZOFRAN) 8 MG tablet Take 1 tablet (8 mg total) by mouth 2 (two) times daily as needed for refractory nausea / vomiting. Start on day 3 after chemo. 30 tablet 1  . oxyCODONE (OXY IR/ROXICODONE) 5 MG immediate release tablet Take 1 tablet (5 mg total) by mouth every 6 (six) hours as needed for severe pain. 60 tablet 0  . OXYGEN Inhale 2 L/min into the lungs continuous.    . pantoprazole (PROTONIX) 40 MG tablet TAKE ONE TABLET BY MOUTH TWICE DAILY 60 tablet 3  . prochlorperazine (COMPAZINE) 10 MG tablet Take 1 tablet (10 mg total) by mouth every 6 (six) hours as needed (Nausea or vomiting). 30 tablet 1  . ZETIA 10 MG tablet TAKE ONE TABLET BY MOUTH ONCE DAILY 90 tablet 1   No facility-administered medications prior to visit.       Allergies:   Patient has no known allergies.   Social History   Social History  . Marital status: Married    Spouse name: susan  . Number of children: 2  . Years of education: N/A   Occupational History  . Retired    Social History Main Topics  . Smoking status: Former Smoker    Packs/day: 1.50    Years: 30.00    Types: Cigarettes, Pipe, Cigars    Quit date: 10/30/1983  . Smokeless tobacco: Former Systems developer    Types: Chew    Quit date: 05/15/2016  . Alcohol use No  . Drug use: No  . Sexual activity: Not Currently   Other Topics Concern  . None   Social History Narrative   Married, one son and one daughter.   His daughter and her two children live with him.   Coffee daily.  Former smoker.  No alcohol.   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   Attends church weekly-   Oxygen continuous        Family History:  The patient's family history includes Alcohol abuse in his father; Aneurysm in his father; Diabetes in his maternal aunt; Heart attack in his mother.   ROS:   Please see the history of present illness.    ROS All other systems reviewed and are negative.  No flowsheet data found.     PHYSICAL EXAM:   VS:  BP 110/60 (BP Location: Right Arm, Patient Position: Sitting, Cuff Size: Large)   Pulse (!) 104   Ht '5\' 9"'  (1.753 m)   Wt 266 lb (120.7 kg)   BMI 39.28 kg/m    GEN: Well nourished, well developed, in no acute distress  HEENT: normal  Neck: no JVD, carotid bruits, or masses Cardiac: RRR; no murmurs, rubs, or gallops.  Trace edema.  Intact distal pulses bilaterally.  Respiratory:  clear to auscultation bilaterally, normal work of breathing GI: soft, nontender, nondistended, + BS MS: no deformity or atrophy  Skin: warm and dry, no rash Neuro:  Alert and Oriented x 3, Strength and sensation are intact Psych:  euthymic mood, full affect  Wt Readings from Last 3 Encounters:  11/28/16 266 lb (120.7 kg)  11/23/16 268 lb 14.4 oz  (122 kg)  11/02/16 264 lb 11.2 oz (120.1 kg)      Studies/Labs Reviewed:   EKG:  EKG is  ordered today and showed NSR at 96bpm with inferior infarct and RBBB  Recent Labs: 03/15/2016: B Natriuretic Peptide 7.4 08/28/2016: TSH 2.940 08/31/2016: Magnesium 1.3 11/23/2016: ALT 21; BUN 11.5; Creatinine 0.8; HGB 10.2; Platelets 127; Potassium 4.0; Sodium 140   Lipid Panel    Component Value Date/Time   CHOL 108 (L) 03/12/2016 1001   TRIG 184 (H) 03/12/2016 1001   TRIG 268 (HH) 09/06/2006 1555   HDL 30 (L) 03/12/2016 1001   CHOLHDL 3.6 03/12/2016 1001   VLDL 37 (H) 03/12/2016 1001   LDLCALC 41 03/12/2016 1001   LDLDIRECT 74.0 04/15/2015 0838    Additional studies/ records that were reviewed today include:  none    ASSESSMENT:    1. Chronic diastolic CHF (congestive heart failure) ([place])   2. Atherosclerosis of native coronary artery of native heart without angina pectoris   3. Essential hypertension   4. Dyslipidemia      PLAN:  In order of problems listed above:  1. Chronic diastolic CHF - he appears euvolemic on exam today.  Weight is stable. He has not required any diuretics.  I have asked him to call if he has any LE edema.  2. ASCAD - cath with severely diseased and chronically occluded LAD and diagonal with right to left collaterals, mild disease in the left circ and moderate disease in the mid RCA on medical management.  He has not had any anginal symptoms recently despite being off long acting nitrates.  He will continue ASA/statin.  He is not on BB due to problems with N/V around Chemo. I will keep him off for now since he tends to get sick with Chemo.  Will reassess in 3 months.  If he develops recurrent CP will restart diuretic.   3. HTN - BP controlled on diet alone. 4. Dyslipidemia - LDL goal < 70  Continue statin. Check FLP and ALT.    Medication Adjustments/Labs and Tests Ordered: Current medicines are reviewed at length with the patient today.  Concerns  regarding medicines are outlined above.  Medication changes, Labs and Tests ordered today are listed in the Patient Instructions below.  There are no Patient Instructions on file for this visit.   Signed, Fransico Him, MD  11/28/2016 10:45 AM    [address], [place], [place]  [address] Phone: [phone]; Fax: [phone]

## 2016-11-28 NOTE — Patient Instructions (Signed)
Medication Instructions:  Your physician recommends that you continue on your current medications as directed. Please refer to the Current Medication list given to you today.   Labwork: TODAY: BMET, LFTs, Lipids  Testing/Procedures: None  Follow-Up: Your physician recommends that you schedule a follow-up appointment in 3 months with Dr. Radford Pax.  Any Other Special Instructions Will Be Listed Below (If Applicable). Please call if you have any chest pain or lower extremity swelling/edema.    If you need a refill on your cardiac medications before your next appointment, please call your pharmacy.

## 2016-11-30 ENCOUNTER — Ambulatory Visit (HOSPITAL_BASED_OUTPATIENT_CLINIC_OR_DEPARTMENT_OTHER): Payer: Medicare Other

## 2016-11-30 ENCOUNTER — Ambulatory Visit: Payer: Medicare Other

## 2016-11-30 ENCOUNTER — Other Ambulatory Visit (HOSPITAL_BASED_OUTPATIENT_CLINIC_OR_DEPARTMENT_OTHER): Payer: Medicare Other

## 2016-11-30 ENCOUNTER — Other Ambulatory Visit: Payer: Self-pay | Admitting: Hematology

## 2016-11-30 VITALS — BP 126/70 | HR 101 | Temp 98.3°F | Resp 18

## 2016-11-30 DIAGNOSIS — Z5111 Encounter for antineoplastic chemotherapy: Secondary | ICD-10-CM | POA: Diagnosis not present

## 2016-11-30 DIAGNOSIS — C160 Malignant neoplasm of cardia: Secondary | ICD-10-CM

## 2016-11-30 DIAGNOSIS — Z95828 Presence of other vascular implants and grafts: Secondary | ICD-10-CM

## 2016-11-30 LAB — COMPREHENSIVE METABOLIC PANEL
ALBUMIN: 3 g/dL — AB (ref 3.5–5.0)
ALT: 32 U/L (ref 0–55)
AST: 38 U/L — AB (ref 5–34)
Alkaline Phosphatase: 72 U/L (ref 40–150)
Anion Gap: 9 mEq/L (ref 3–11)
BILIRUBIN TOTAL: 0.64 mg/dL (ref 0.20–1.20)
BUN: 8.9 mg/dL (ref 7.0–26.0)
CO2: 27 mEq/L (ref 22–29)
CREATININE: 0.7 mg/dL (ref 0.7–1.3)
Calcium: 8.9 mg/dL (ref 8.4–10.4)
Chloride: 104 mEq/L (ref 98–109)
EGFR: >90 mL/min/{1.73_m2} (ref >90)
GLUCOSE: 126 mg/dL (ref 70–140)
Potassium: 4 mEq/L (ref 3.5–5.1)
SODIUM: 140 meq/L (ref 136–145)
TOTAL PROTEIN: 6.1 g/dL — AB (ref 6.4–8.3)

## 2016-11-30 LAB — CBC WITH DIFFERENTIAL/PLATELET
BASO%: 0.3 % (ref 0.0–2.0)
Basophils Absolute: 0 10*3/uL (ref 0.0–0.1)
EOS ABS: 0.1 10*3/uL (ref 0.0–0.5)
EOS%: 4 % (ref 0.0–7.0)
HCT: 28.6 % — ABNORMAL LOW (ref 38.4–49.9)
HEMOGLOBIN: 9.3 g/dL — AB (ref 13.0–17.1)
LYMPH%: 15.2 % (ref 14.0–49.0)
MCH: 28.6 pg (ref 27.2–33.4)
MCHC: 32.5 g/dL (ref 32.0–36.0)
MCV: 88 fL (ref 79.3–98.0)
MONO#: 0.3 10*3/uL (ref 0.1–0.9)
MONO%: 8.4 % (ref 0.0–14.0)
NEUT%: 72.1 % (ref 39.0–75.0)
NEUTROS ABS: 2.3 10*3/uL (ref 1.5–6.5)
Platelets: 75 10*3/uL — ABNORMAL LOW (ref 140–400)
RBC: 3.25 10*6/uL — ABNORMAL LOW (ref 4.20–5.82)
RDW: 17.8 % — AB (ref 11.0–14.6)
WBC: 3.2 10*3/uL — AB (ref 4.0–10.3)
lymph#: 0.5 10*3/uL — ABNORMAL LOW (ref 0.9–3.3)

## 2016-11-30 MED ORDER — SODIUM CHLORIDE 0.9% FLUSH
10.0000 mL | INTRAVENOUS | Status: DC | PRN
  Administered 2016-11-30: 10 mL
  Filled 2016-11-30: qty 10

## 2016-11-30 MED ORDER — DIPHENHYDRAMINE HCL 50 MG/ML IJ SOLN
25.0000 mg | Freq: Once | INTRAMUSCULAR | Status: AC
  Administered 2016-11-30: 25 mg via INTRAVENOUS

## 2016-11-30 MED ORDER — HEPARIN SOD (PORK) LOCK FLUSH 100 UNIT/ML IV SOLN
500.0000 [IU] | Freq: Once | INTRAVENOUS | Status: AC | PRN
  Administered 2016-11-30: 500 [IU]
  Filled 2016-11-30: qty 5

## 2016-11-30 MED ORDER — SODIUM CHLORIDE 0.9 % IV SOLN
70.0000 mg/m2 | Freq: Once | INTRAVENOUS | Status: AC
  Administered 2016-11-30: 180 mg via INTRAVENOUS
  Filled 2016-11-30: qty 30

## 2016-11-30 MED ORDER — PALONOSETRON HCL INJECTION 0.25 MG/5ML
INTRAVENOUS | Status: AC
  Filled 2016-11-30: qty 5

## 2016-11-30 MED ORDER — FAMOTIDINE IN NACL 20-0.9 MG/50ML-% IV SOLN
INTRAVENOUS | Status: AC
  Filled 2016-11-30: qty 50

## 2016-11-30 MED ORDER — FAMOTIDINE IN NACL 20-0.9 MG/50ML-% IV SOLN
20.0000 mg | Freq: Once | INTRAVENOUS | Status: AC
  Administered 2016-11-30: 20 mg via INTRAVENOUS

## 2016-11-30 MED ORDER — DIPHENHYDRAMINE HCL 50 MG/ML IJ SOLN
INTRAMUSCULAR | Status: AC
  Filled 2016-11-30: qty 1

## 2016-11-30 MED ORDER — SODIUM CHLORIDE 0.9% FLUSH
10.0000 mL | INTRAVENOUS | Status: DC | PRN
Start: 2016-11-30 — End: 2016-11-30
  Administered 2016-11-30: 10 mL via INTRAVENOUS
  Filled 2016-11-30: qty 10

## 2016-11-30 MED ORDER — SODIUM CHLORIDE 0.9 % IV SOLN
Freq: Once | INTRAVENOUS | Status: AC
  Administered 2016-11-30: 11:00:00 via INTRAVENOUS

## 2016-11-30 MED ORDER — PALONOSETRON HCL INJECTION 0.25 MG/5ML
0.2500 mg | Freq: Once | INTRAVENOUS | Status: AC
  Administered 2016-11-30: 0.25 mg via INTRAVENOUS

## 2016-11-30 MED ORDER — DEXAMETHASONE SODIUM PHOSPHATE 10 MG/ML IJ SOLN
10.0000 mg | Freq: Once | INTRAMUSCULAR | Status: AC
Start: 2016-11-30 — End: 2016-11-30
  Administered 2016-11-30: 10 mg via INTRAVENOUS

## 2016-11-30 MED ORDER — DEXAMETHASONE SODIUM PHOSPHATE 10 MG/ML IJ SOLN
INTRAMUSCULAR | Status: AC
  Filled 2016-11-30: qty 1

## 2016-11-30 NOTE — Progress Notes (Signed)
OK to treat with Taxol only with platelet count of 75 per Dr Burr Medico.  Order repeated & verified.

## 2016-11-30 NOTE — Patient Instructions (Signed)
[facility] Discharge Instructions for Patients Receiving Chemotherapy  Today you received the following chemotherapy agents Taxol   To help prevent nausea and vomiting after your treatment, we encourage you to take your nausea medication as directed.   If you develop nausea and vomiting that is not controlled by your nausea medication, call the clinic.   BELOW ARE SYMPTOMS THAT SHOULD BE REPORTED IMMEDIATELY:  *FEVER GREATER THAN 100.5 F  *CHILLS WITH OR WITHOUT FEVER  NAUSEA AND VOMITING THAT IS NOT CONTROLLED WITH YOUR NAUSEA MEDICATION  *UNUSUAL SHORTNESS OF BREATH  *UNUSUAL BRUISING OR BLEEDING  TENDERNESS IN MOUTH AND THROAT WITH OR WITHOUT PRESENCE OF ULCERS  *URINARY PROBLEMS  *BOWEL PROBLEMS  UNUSUAL RASH Items with * indicate a potential emergency and should be followed up as soon as possible.  Feel free to call the clinic you have any questions or concerns. The clinic phone number is [phone].  Please show the CHEMO ALERT CARD at check-in to the Emergency Department and triage nurse.   

## 2016-11-30 NOTE — Progress Notes (Signed)
Platelets 75. Okay to treat with Taxol only per [facility] RN per Dr. Burr Medico.

## 2016-12-08 DIAGNOSIS — J449 Chronic obstructive pulmonary disease, unspecified: Secondary | ICD-10-CM | POA: Diagnosis not present

## 2016-12-14 NOTE — Progress Notes (Signed)
[place]  [phone] [phone]  Clinic Follow up Note   Patient Care Team: Tammi Sou, MD as PCP - General (Family Medicine) Ulyses Southward, MD as Consulting Physician (Allergy and Immunology) Calton Dach, MD as Consulting Physician (Optometry) Sueanne Margarita, MD as Consulting Physician (Cardiology) Jerene Bears, MD as Consulting Physician (Gastroenterology) Latanya Maudlin, MD as Consulting Physician (Orthopedic Surgery) Jodi Marble, MD as Consulting Physician (Otolaryngology) Truitt Merle, MD as Consulting Physician (Hematology) Kyung Rudd, MD as Consulting Physician (Radiation Oncology) 12/18/2016    CHIEF COMPLAINTS:  Follow up GE junction cancer  Oncology History   Cancer of cardio-esophageal junction [facility])   Staging form: Stomach, AJCC 7th Edition     Clinical stage from 05/04/2016: Stage IV ([place], N2, M1) - Signed by Truitt Merle, MD on 05/18/2016        Cancer of cardio-esophageal junction ([place])   05/04/2016 Initial Diagnosis    Cancer of cardio-esophageal junction ([place])      05/04/2016 Procedure    EGD showed a large ulcerating mass with no active bleeding at the gastroesophageal junction extending into the gastric cardia. The mass was not obstructing and partially circumferential, extends approximately 5 cm. Nonbleeding erosive gastropathy.      05/04/2016 Initial Biopsy    Esophageal gastric junction biopsy showed a poorly differentiated carcinoma underlying the squamous mucosa. There is lymphovascular invasion. No Intestinal metaplasia. IHC weakly positive CK5/6, p63 (-), favor squamous.        05/09/2016 Imaging    CT CAP w contrast showed mild wall thickening involving the distal esophagus and proximal stomach compatible with known cancer, enlarged mediastinal and upper abdominal lymph nodes are highly suspicious for metastatic adenopathy, propable liver cirrhosis.      06/04/2016 - 06/22/2016 Radiation Therapy   palliative radiation to esophageal cancer       07/06/2016 -  Chemotherapy    Weekly carbo and taxol      07/30/2016 Pathology Results    PD-L1 negative expression      08/28/2016 - 08/31/2016 Hospital Admission    The patient was admitted to 4 severe hypoglycemia, dehydration, and acute renal failure. Infection workup was negative. He recovered well with supportive care. His insulin and hypertension medication was held on discharge, except insulin sliding scale.      09/24/2016 Imaging    CT CAP W CONTRAST 09/26/2016 IMPRESSION: Decreased masslike soft tissue prominence at gastroesophageal junction, consistent with decreased size of primary gastroesophageal junction carcinoma. Resolution of paraesophageal and gastrohepatic ligament lymphadenopathy since prior exam. Other sub-cm mediastinal lymph nodes show little or no significant change. Stable indeterminate sub-cm low-attenuation lesion in the liver dome. Probable hepatic cirrhosis. Recommend continued attention on follow-up CT. No new or progressive metastatic disease identified. Cholelithiasis.  No radiographic evidence of cholecystitis. Colonic diverticulosis. No radiographic evidence of diverticulitis. Aortic atherosclerosis and three-vessel coronary artery calcification.       GE junction carcinoma ([place])    HISTORY OF PRESENTING ILLNESS:  [name] 71 y.o. male is here because of His reason that diagnosed GE junction carcinoma. He is a comment of by his wife and daughter to our multidisciplinary chart clinic today.  He has been having progressive dysphagia and odynophagia for 2 month, he has been eating soft diet only in the past one week. He has pain in the mid chest and epigastric area only when he eats, with burning sensation, no nausea, abdominal bloating, or other discomfort. His appetite has remained  well, no weight loss,   He has multiple medical problems, especially heart failure, he has been on oxygen  continuously for 5-6 years. He is morbidly obese, has diabetes, hypertension, mild neuropathy, OSA etc. He has very sedentary life style, he sits in the chair and watch TV most of time during the day. He only comes out for shopping for a few times a week. He uses a Barrister's [employer], he can walk for a few hundrends feet before he has to stop to catch his breasts. He denies cough or sputum production, no GI discomfort, he has been having mild dirrhea, loose stool, twice daily, no melena or hematochezia.  CURRENT THERAPY: chemotherapy with weekly carboplatin and taxol,  started on 07/06/2016, held 08/21/16-09/28/2016 due to hospitalization, changed to 2 weeks on and 2 weeks off after that   INTERIM HISTORY: Mr. Anacker returns for follow-up. He reports he is doing well, and tolerated his last chemotherapy infusion well. He reports he is more cold-natured than he used to be. He reports an arthritis flare up on 12/14/16 for which he took oxycodone with relief. The pain lasted 24 hours, and has not returned.   MEDICAL HISTORY:  Past Medical History:  Diagnosis Date  . Asthma    as a child  . Cataract    multiple types, bilateral  . Chronic diastolic CHF (congestive heart failure) ([place]) 02/25/2009  . Chronic renal insufficiency, stage III (moderate) 2017   Stage II/III (GFR around 60)  . 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 COPD (chronic obstructive pulmonary disease) ([place])   . Coronary artery disease    chronically occluded LAD and diagonal with right to left collaterals, mild disease in the left circ and moderate disease in the mid RCA on medical management with Imdur, ASA, and Plavix.  . Dermatitis 05/2014  . DIABETES MELLITUS, TYPE II 06/24/2007   No diab retpthy as of 08/05/15 eye exam.  . Esophageal cancer ([place]) 04/2016   poorly differentiated carcinoma (Dr. Pyrtle--EGD).  CT C/A/P showed metastatic adenopathy in mediastinum and upper  abdomen 05/09/16.  Tx plan is palliative radiation (completed 06/22/16), then palliative systemic chemotherapy (carbo+taxol) was started but as of 08/03/16 onc f/u this was held due to severe knee and ankle arthralgias.  Restarting as of 10/2015 (08/2016 CT showed some disease regression  . Essential hypertension 05/01/2007   Qualifier: Diagnosis of  By: Tiney Rouge CMA, Ellison Hughs     . History of kidney stones   . Hyperkalemia 12/2015   Decreased ACE-I by 50% in response, then potassium normalized.  Marland Kitchen HYPERLIPIDEMIA 06/24/2007  . Hypoxemia 01/27/2010  . Myocardial infarction    pt states he was informed per MD that he has had one but pt was unaware   . OBESITY 09/02/2008  . Obesity hypoventilation syndrome (HCC)    oxygen 24/7 (2 liters [place] as of 02/2016)  . On home oxygen therapy    Oxygen @ 2l/m nasally 24/7 hours  . OSA (obstructive sleep apnea)    not tested; pt scored 4 per stop bang tool results sent to PCP   . OSTEOARTHRITIS 05/01/2007  . Presbycusis of both ears 05/2015   [facility]  . Pruritic condition 05/2014   Allergist summer 2015, no new testing.  [name] field defect 05/10/16   Per Dr. Melina Fiddler, O.D.: Rt, loss of inf/temp quad and some loss sup/temp.  ?CVA  ? Pituitary tumor? ?Brain met.  SURGICAL HISTORY: Past Surgical History:  Procedure Laterality Date  . APPENDECTOMY    . BALLOON DILATION N/A 05/04/2016   Procedure: BALLOON DILATION;  Surgeon: Jerene Bears, MD;  Location: [facility];  Service: Gastroenterology;  Laterality: N/A;  . CARDIAC CATHETERIZATION    . CARPAL TUNNEL RELEASE Left   . CATARACT EXTRACTION W/PHACO Right 04/29/2013   Procedure: CATARACT EXTRACTION PHACO AND INTRAOCULAR LENS PLACEMENT (IOC);  Surgeon: Adonis Brook, MD;  Location: [facility];  Service: Ophthalmology;  Laterality: Right;  . CATARACT EXTRACTION, BILATERAL    . COLONOSCOPY W/ POLYPECTOMY  08/2011   Many polyps--all hyperplastic, severe diverticulosis, int hem.  [employer] hemoccult testing via [facility] 06/22/15  NEG  . ESOPHAGOGASTRODUODENOSCOPY N/A 05/04/2016   Procedure: ESOPHAGOGASTRODUODENOSCOPY (EGD);  Surgeon: Jerene Bears, MD;  Location: [facility];  Service: Gastroenterology;  Laterality: N/A;  . IR GENERIC HISTORICAL  10/03/2016   IR US GUIDE VASC ACCESS RIGHT 10/03/2016 Greggory Keen, MD [facility]  . IR GENERIC HISTORICAL  10/03/2016   IR FLUORO GUIDE PORT INSERTION RIGHT 10/03/2016 Greggory Keen, MD [facility]  . KNEE ARTHROSCOPY WITH MEDIAL MENISECTOMY Right 12/16/2014   Procedure: RIGHT KNEE ARTHROSCOPY WITH MEDIAL MENISECTOMY microfracture medial femoral condyle abrasion condroplasty medial femoral condyle lateral menisectomy;  Surgeon: Tobi Bastos, MD;  Location: [facility];  Service: Orthopedics;  Laterality: Right;  . LEFT HEART CATHETERIZATION WITH CORONARY ANGIOGRAM N/A 10/26/2013   Procedure: LEFT HEART CATHETERIZATION WITH CORONARY ANGIOGRAM;  Surgeon: Jettie Booze, MD;  Location: [facility] CATH LAB;  Service: Cardiovascular;  Laterality: N/A;  . LUMBAR [place] SURGERY  2001  . SHOULDER SURGERY Right    right fx  . TONSILLECTOMY    . TRANSTHORACIC ECHOCARDIOGRAM  03/2016   EF 55-60%, grade I DD, LAE  . WRIST SURGERY Right    right fx    SOCIAL HISTORY: Social History   Social History  . Marital status: Married    Spouse name: susan  . Number of children: 2  . Years of education: N/A   Occupational History  . Retired    Social History Main Topics  . Smoking status: Former Smoker    Packs/day: 1.50    Years: 30.00    Types: Cigarettes, Pipe, Cigars    Quit date: 10/30/1983  . Smokeless tobacco: Former Systems developer    Types: Chew    Quit date: 05/15/2016  . Alcohol use No  . Drug use: No  . Sexual activity: Not Currently   Other Topics Concern  . Not on file   Social History Narrative   Married, one son and one daughter.   His daughter and her two children live with him.   Coffee daily.  Former smoker.  No alcohol.   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   Attends church weekly-   Oxygen continuous       FAMILY HISTORY: Family History  Problem Relation Age of Onset  . Heart attack Mother   . Aneurysm Father   . Alcohol abuse Father   . Diabetes Maternal Aunt   . Colon cancer Neg Hx     ALLERGIES:  has No Known Allergies.  MEDICATIONS:  Current Outpatient Prescriptions  Medication Sig Dispense Refill  . aspirin 81 MG tablet Take 81 mg by mouth daily.      Marland Kitchen atorvastatin (LIPITOR) 80 MG tablet Take 1 tablet (80 mg total) by mouth daily. 90 tablet 3  . beta carotene w/minerals (OCUVITE)  tablet Take 1 tablet by mouth daily.    . cetirizine (ZYRTEC) 10 MG tablet Take 10 mg by mouth daily.    . citalopram (CELEXA) 20 MG tablet Take 1 tablet (20 mg total) by mouth daily. 90 tablet 3  . clonazePAM (KLONOPIN) 1 MG tablet TAKE ONE TABLET BY MOUTH TWICE DAILY AS NEEDED FOR ANXIETY 60 tablet 5  . cromolyn (OPTICROM) 4 % ophthalmic solution Place 1 drop into both eyes 4 (four) times daily.    Marland Kitchen gabapentin (NEURONTIN) 300 MG capsule TAKE ONE CAPSULE BY MOUTH THREE TIMES DAILY 270 capsule 3  . hydrOXYzine (ATARAX/VISTARIL) 25 MG tablet TAKE 1 TABLET BY MOUTH AT SUPPER AND 1 TABLET BY MOUTH AT BEDTIME FOR INSOMNIA 60 tablet 6  . insulin lispro (HUMALOG KWIKPEN) 100 UNIT/ML KiwkPen Inject into the skin. SS at meal time    . Insulin NPH, Human,, Isophane, (HUMULIN N) 100 UNIT/ML Kiwkpen Inject into the skin. 7 units every morning and 7 units every evening    . magnesium chloride (SLOW-MAG) 64 MG TBEC SR tablet Take 1 tablet (64 mg total) by mouth daily. 60 tablet 0  . metFORMIN (GLUCOPHAGE) 1000 MG tablet TAKE ONE TABLET BY MOUTH TWICE DAILY WITH MEALS 180 tablet 1  . Omega-3 Fatty Acids ([employer] FISH OIL) 1000 MG CAPS Take 2 tablets by mouth 2 (two) times daily. 90 capsule   . ondansetron (ZOFRAN) 8 MG tablet Take 1 tablet (8 mg total) by mouth 2 (two) times daily as needed for refractory nausea / vomiting. Start on day 3 after  chemo. 30 tablet 1  . ONETOUCH DELICA LANCETS 22W MISC     . oxyCODONE (OXY IR/ROXICODONE) 5 MG immediate release tablet Take 1 tablet (5 mg total) by mouth every 6 (six) hours as needed for severe pain. 60 tablet 0  . OXYGEN Inhale 2 L/min into the lungs continuous.    . pantoprazole (PROTONIX) 40 MG tablet TAKE ONE TABLET BY MOUTH TWICE DAILY 60 tablet 3  . prochlorperazine (COMPAZINE) 10 MG tablet Take 1 tablet (10 mg total) by mouth every 6 (six) hours as needed (Nausea or vomiting). 30 tablet 1  . ZETIA 10 MG tablet TAKE ONE TABLET BY MOUTH ONCE DAILY 90 tablet 1   No current facility-administered medications for this visit.     REVIEW OF SYSTEMS:   Constitutional: Denies fevers, abnormal night sweats, (+) reports feeling "cold natured" recently Eyes: Denies blurriness of vision, double vision or watery eyes Ears, nose, mouth, throat, and face: Denies mucositis or sore throat, denies difficulty swallowing Respiratory: Denies cough, dyspnea or wheezes Cardiovascular: Denies palpitation, chest discomfort or lower extremity swelling Gastrointestinal:  Denies nausea, heartburn or change in bowel habits Skin: Denies abnormal skin rashes Lymphatics: Denies new lymphadenopathy or easy bruising Neurological:Denies numbness, tingling or new weaknesses (+) leg weakness, slowly improving with physical therapy Behavioral/Psych: Mood is stable, no new changes  All other systems were reviewed with the patient and are negative.  PHYSICAL EXAMINATION: ECOG PERFORMANCE STATUS: 3 - Symptomatic, >50% confined to bed  Vitals:   12/18/16 1302  BP: 134/71  Pulse: 92  Resp: 18  Temp: 98.7 F (37.1 C)   Filed Weights   12/18/16 1302  Weight: 268 lb 6.4 oz (121.7 kg)   GENERAL:alert, no distress and comfortable, morbid obese, sitting in wheelchair with nasal cannula oxygen  SKIN: skin color, texture, turgor are normal, no rashes or significant lesions EYES: normal, conjunctiva are pink and  non-injected, sclera clear OROPHARYNX:no exudate,  no erythema and lips, buccal mucosa, and tongue normal  NECK: supple, thyroid normal size, non-tender, without nodularity LYMPH:  no palpable lymphadenopathy in the cervical, axillary or inguinal LUNGS: clear, no wheezing HEART: regular rate & rhythm and no murmurs and no lower extremity edema ABDOMEN:abdomen soft, non-tender and normal bowel sounds Musculoskeletal:no cyanosis of digits and no clubbing  PSYCH: alert & oriented x 3 with fluent speech NEURO: no focal motor/sensory deficits EXT: Trace edema, he wears compression stocks, no significant ankle swollen, erythema, or warmness.   LABORATORY DATA:  I have reviewed the data as listed CBC Latest Ref Rng & Units 12/18/2016 11/30/2016 11/23/2016  WBC 4.0 - 10.3 10e3/uL 4.2 3.2(L) 4.2  Hemoglobin 13.0 - 17.1 g/dL 9.1(L) 9.3(L) 10.2(L)  Hematocrit 38.4 - 49.9 % 27.9(L) 28.6(L) 31.8(L)  Platelets 140 - 400 10e3/uL 195 75(L) 127(L)   CMP Latest Ref Rng & Units 12/18/2016 11/30/2016 11/28/2016  Glucose 70 - 140 mg/dl 106 126 101(H)  BUN 7.0 - 26.0 mg/dL 13.7 8.9 11  Creatinine 0.7 - 1.3 mg/dL 0.8 0.7 0.64(L)  Sodium 136 - 145 mEq/L 137 140 138  Potassium 3.5 - 5.1 mEq/L 4.0 4.0 4.3  Chloride 96 - 106 mmol/L - - 97  CO2 22 - 29 mEq/L '27 27 27  ' Calcium 8.4 - 10.4 mg/dL 8.9 8.9 8.6  Total Protein 6.4 - 8.3 g/dL 6.4 6.1(L) 5.9(L)  Total Bilirubin 0.20 - 1.20 mg/dL 0.51 0.64 0.7  Alkaline Phos 40 - 150 U/L 81 72 65  AST 5 - 34 U/L 25 38(H) 25  ALT 0 - 55 U/L 20 32 23    PATHOLOGY REPORT Diagnosis 05/04/2016 1. Esophagogastric junction, biopsy, ulcerative mass - POORLY DIFFERENTIATED CARCINOMA, SEE COMMENT. 2. Stomach, biopsy - REACTIVE GASTROPATHY. - NEGATIVE FOR HELICOBACTER PYLORI. - NO INTESTINAL METAPLASIA, DYSPLASIA, OR MALIGNANCY. Microscopic Comment 1. The majority of the specimen consists of gastroesophageal mucosa with reflux changes. There is a small focus of tumor underlying  the squamous mucosa. There is lymphovascular invasion. There is no background intestinal metaplasia (Barrett's esophagus). Immunohistochemistry reveals only very focal weak cytokeratin 5/6, negative p63, and negative mucicarmine in the limited remaining tumor. Dr. Saralyn Pilar has reviewed the case. The case was called to Dr. Hilarie Fredrickson on 05/08/2016. 2. A Warthin-Starry stain is performed to determine the possibility of the presence of Helicobacter pylori. The Warthin-Starry stain is negative for organisms of Helicobacter pylori.     RADIOGRAPHIC STUDIES: I have personally reviewed the radiological images as listed and agreed with the findings in the report. No results found. EGD 05/04/2016 A large, ulcerating mass with no active bleeding and no stigmata of recent bleeding was found at the gastroesophageal junction extending into the gastric cardia, beginning 40 cm from the incisors. The mass was non-obstructing and partially circumferential (involving one-half of the lumen circumference). The mass extends approximately 5 cm. IMPRESSION:  -Likely malignant esophageal tumor was found at the gastroesophageal junction extending into the gastric cardia. Biopsied. - Non-bleeding erosive gastropathy. Biopsied. - Erythematous duodenopathy. - Normal second portion of the duodenum.  ASSESSMENT & PLAN: 71 y.o. Caucasian male, with multiple comorbidities, including hypertension, diabetes, OSA, coronary artery disease, diastolic CHF, on continuous oxygen, morbid obesity, presented with progressive dysphagia and odynophagia.  1. Cancer of cardio-esophageal junction, poorly differentiated, cTxN2M1, stage IV  -I previously reviewed his CT scan, EGD, and the biopsy results with patient and his family members in great details. -His biopsy pathology was previously reviewed in our tumor board 2 days ago, the  morphology and weakly positive for CK 5/6, fevers squamous cell carcinoma -His CT scan findings are very  concerning for distant metastasis to abdominal lymph nodes.  -I previously reviewed the PET scan images with patient and his daughter in person, he has hypermetabolic GE junction tumor, and diffuse adenopathy in mediastinum and upper abdomen. -We previously discussed the aggressive nature of esophageal cancer, an incurable nature of his disease due to the distant metastasis. The goal of therapy is palliative, to prolong his life and palliate his symptoms. -His tumor was negative for PD-L1, not a candidate for immunotherapy  -He is on first line palliative chemotherapy with weekly carbo and Taxol, 2 weeks on, 2 weeks off, tolerating better lately.-I previously reviewed his CT scan findings on 09/26/2016 show good partial response to chemoradiation, no new or progressive metastatic disease. -He is clinically doing well, tolerating treatment very well. Repeat CT scan in March 2018 -He has had cytopenia from chemo. We discussed the possibility of changing the patient's infusion schedule to once every other week to allow time for his bone marrow to recover. He is agreeable to this plan.  2. CAD, diastolic CHF, on continuous oxygen -He will continue follow-up with his cardiologist Dr. Radford Pax -His Plavix has been held since the EGD and biopsy, he is on baby aspirin. -Need to watch his fluid intake during his chemotherapy, and also avoid fluid overload from chemo  -he is back on lasix, dsypnea improved   3. DM, HTN -We previously discussed his blood pressure and glucose need to be monitored closely during the chemotherapy, which may affect his blood glucose and blood pressure -He will continue medication, monitor his sugar and blood pressure at home, and follow-up with his primary care physician. -will decrease premed dexa before taxol  -History simple has been held due to his borderline low blood pressure -He will see his primary care physician Dr. Anitra Lauth for diabetes management.   4. OSA, morbid  obesity -We previously discussed healthy diet, I encouraged him to to be more physically active, and consider home PT -I strongly encouraged him to follow-up with dietitian during the therapy for nutrition support.  5. Epigastric pain -Likely secondary to his underlying malignancy -worse after radiation, resolved now  -continue tramadol as need, he has not need it lately.  6. Arthralgia -He has baseline osteoarthritis, not physically active -However his arthralgia in his knees and ankle are much worse after chemotherapy but resolved now  -He will use oxycodone 5 mg as needed, constipation management previously reviewed with him. -Leg weakness improved with physical therapy. Previously encouraged the patient to continue with physical therapy.   7. Anemia -Second is to underlying malignancy and chemotherapy -Hemoglobin is slightly low, 9.1 TODAY. -we'll consider blood transfusion if hemoglobin less than 8, for symptomatic anemia.  8. Goal of care discussion  -We previously discussed the incurable nature of his cancer, and the overall poor prognosis, especially if he does not have good response to chemotherapy or progress on chemo -The patient understands the goal of care is palliative. -Patient is very realistic, understands his cancer is not curable, and he agrees with DO NOT RESUSCITATE and DO NOT INTUBATE  PLAN -He will receive chemo carboplatin and Taxol treatment today, and continue every 2 weeks  -The patient will return for follow up in 4 weeks. -CT restaging scan in 1 month.  All questions were answered. The patient knows to call the clinic with any problems, questions or concerns.  I spent 20 minutes  counseling the patient face to face. The total time spent in the appointment was 25 minutes and more than 35% was on counseling.  This document serves as a record of services personally performed by Truitt Merle, MD. It was created on her behalf by Maryla Morrow, a trained  medical scribe. The creation of this record is based on the scribe's personal observations and the provider's statements to them. This document has been checked and approved by the attending provider.    Truitt Merle, MD 12/18/2016

## 2016-12-18 ENCOUNTER — Ambulatory Visit: Payer: Medicare Other

## 2016-12-18 ENCOUNTER — Ambulatory Visit (HOSPITAL_BASED_OUTPATIENT_CLINIC_OR_DEPARTMENT_OTHER): Payer: Medicare Other

## 2016-12-18 ENCOUNTER — Telehealth: Payer: Self-pay | Admitting: Hematology

## 2016-12-18 ENCOUNTER — Other Ambulatory Visit (HOSPITAL_BASED_OUTPATIENT_CLINIC_OR_DEPARTMENT_OTHER): Payer: Medicare Other

## 2016-12-18 ENCOUNTER — Ambulatory Visit (HOSPITAL_BASED_OUTPATIENT_CLINIC_OR_DEPARTMENT_OTHER): Payer: Medicare Other | Admitting: Hematology

## 2016-12-18 VITALS — BP 134/71 | HR 92 | Temp 98.7°F | Resp 18 | Ht 69.0 in | Wt 268.4 lb

## 2016-12-18 DIAGNOSIS — C160 Malignant neoplasm of cardia: Secondary | ICD-10-CM | POA: Diagnosis not present

## 2016-12-18 DIAGNOSIS — Z5111 Encounter for antineoplastic chemotherapy: Secondary | ICD-10-CM

## 2016-12-18 DIAGNOSIS — I10 Essential (primary) hypertension: Secondary | ICD-10-CM | POA: Diagnosis not present

## 2016-12-18 DIAGNOSIS — E119 Type 2 diabetes mellitus without complications: Secondary | ICD-10-CM | POA: Diagnosis not present

## 2016-12-18 DIAGNOSIS — I5032 Chronic diastolic (congestive) heart failure: Secondary | ICD-10-CM

## 2016-12-18 DIAGNOSIS — R1013 Epigastric pain: Secondary | ICD-10-CM

## 2016-12-18 DIAGNOSIS — D630 Anemia in neoplastic disease: Secondary | ICD-10-CM

## 2016-12-18 DIAGNOSIS — M1990 Unspecified osteoarthritis, unspecified site: Secondary | ICD-10-CM

## 2016-12-18 DIAGNOSIS — Z95828 Presence of other vascular implants and grafts: Secondary | ICD-10-CM

## 2016-12-18 LAB — CBC WITH DIFFERENTIAL/PLATELET
BASO%: 1 % (ref 0.0–2.0)
BASOS ABS: 0 10*3/uL (ref 0.0–0.1)
EOS%: 2 % (ref 0.0–7.0)
Eosinophils Absolute: 0.1 10*3/uL (ref 0.0–0.5)
HEMATOCRIT: 27.9 % — AB (ref 38.4–49.9)
HEMOGLOBIN: 9.1 g/dL — AB (ref 13.0–17.1)
LYMPH#: 0.7 10*3/uL — AB (ref 0.9–3.3)
LYMPH%: 16.5 % (ref 14.0–49.0)
MCH: 29 pg (ref 27.2–33.4)
MCHC: 32.6 g/dL (ref 32.0–36.0)
MCV: 88.9 fL (ref 79.3–98.0)
MONO#: 0.7 10*3/uL (ref 0.1–0.9)
MONO%: 17.7 % — ABNORMAL HIGH (ref 0.0–14.0)
NEUT%: 62.8 % (ref 39.0–75.0)
NEUTROS ABS: 2.6 10*3/uL (ref 1.5–6.5)
Platelets: 195 10*3/uL (ref 140–400)
RBC: 3.14 10*6/uL — ABNORMAL LOW (ref 4.20–5.82)
RDW: 19.6 % — AB (ref 11.0–14.6)
WBC: 4.2 10*3/uL (ref 4.0–10.3)

## 2016-12-18 LAB — COMPREHENSIVE METABOLIC PANEL
ALBUMIN: 2.9 g/dL — AB (ref 3.5–5.0)
ALK PHOS: 81 U/L (ref 40–150)
ALT: 20 U/L (ref 0–55)
AST: 25 U/L (ref 5–34)
Anion Gap: 9 mEq/L (ref 3–11)
BILIRUBIN TOTAL: 0.51 mg/dL (ref 0.20–1.20)
BUN: 13.7 mg/dL (ref 7.0–26.0)
CALCIUM: 8.9 mg/dL (ref 8.4–10.4)
CO2: 27 mEq/L (ref 22–29)
CREATININE: 0.8 mg/dL (ref 0.7–1.3)
Chloride: 101 mEq/L (ref 98–109)
EGFR: >90 mL/min/{1.73_m2} (ref >90)
GLUCOSE: 106 mg/dL (ref 70–140)
Potassium: 4 mEq/L (ref 3.5–5.1)
Sodium: 137 mEq/L (ref 136–145)
TOTAL PROTEIN: 6.4 g/dL (ref 6.4–8.3)

## 2016-12-18 MED ORDER — FAMOTIDINE IN NACL 20-0.9 MG/50ML-% IV SOLN
INTRAVENOUS | Status: AC
  Filled 2016-12-18: qty 50

## 2016-12-18 MED ORDER — SODIUM CHLORIDE 0.9% FLUSH
10.0000 mL | INTRAVENOUS | Status: DC | PRN
  Administered 2016-12-18: 10 mL via INTRAVENOUS
  Filled 2016-12-18: qty 10

## 2016-12-18 MED ORDER — DEXAMETHASONE SODIUM PHOSPHATE 10 MG/ML IJ SOLN
10.0000 mg | Freq: Once | INTRAMUSCULAR | Status: AC
  Administered 2016-12-18: 10 mg via INTRAVENOUS

## 2016-12-18 MED ORDER — PALONOSETRON HCL INJECTION 0.25 MG/5ML
INTRAVENOUS | Status: AC
  Filled 2016-12-18: qty 5

## 2016-12-18 MED ORDER — DIPHENHYDRAMINE HCL 50 MG/ML IJ SOLN
25.0000 mg | Freq: Once | INTRAMUSCULAR | Status: AC
Start: 2016-12-18 — End: 2016-12-18
  Administered 2016-12-18: 25 mg via INTRAVENOUS

## 2016-12-18 MED ORDER — SODIUM CHLORIDE 0.9 % IV SOLN
Freq: Once | INTRAVENOUS | Status: AC
  Administered 2016-12-18: 15:00:00 via INTRAVENOUS

## 2016-12-18 MED ORDER — SODIUM CHLORIDE 0.9 % IV SOLN
70.0000 mg/m2 | Freq: Once | INTRAVENOUS | Status: AC
  Administered 2016-12-18: 180 mg via INTRAVENOUS
  Filled 2016-12-18: qty 30

## 2016-12-18 MED ORDER — PALONOSETRON HCL INJECTION 0.25 MG/5ML
0.2500 mg | Freq: Once | INTRAVENOUS | Status: AC
  Administered 2016-12-18: 0.25 mg via INTRAVENOUS

## 2016-12-18 MED ORDER — HEPARIN SOD (PORK) LOCK FLUSH 100 UNIT/ML IV SOLN
500.0000 [IU] | Freq: Once | INTRAVENOUS | Status: AC | PRN
  Administered 2016-12-18: 500 [IU]
  Filled 2016-12-18: qty 5

## 2016-12-18 MED ORDER — DEXAMETHASONE SODIUM PHOSPHATE 10 MG/ML IJ SOLN
INTRAMUSCULAR | Status: AC
  Filled 2016-12-18: qty 1

## 2016-12-18 MED ORDER — FAMOTIDINE IN NACL 20-0.9 MG/50ML-% IV SOLN
20.0000 mg | Freq: Once | INTRAVENOUS | Status: AC
  Administered 2016-12-18: 20 mg via INTRAVENOUS

## 2016-12-18 MED ORDER — DIPHENHYDRAMINE HCL 50 MG/ML IJ SOLN
INTRAMUSCULAR | Status: AC
  Filled 2016-12-18: qty 1

## 2016-12-18 MED ORDER — SODIUM CHLORIDE 0.9% FLUSH
10.0000 mL | INTRAVENOUS | Status: DC | PRN
  Administered 2016-12-18: 10 mL
  Filled 2016-12-18: qty 10

## 2016-12-18 MED ORDER — SODIUM CHLORIDE 0.9 % IV SOLN
300.0000 mg | Freq: Once | INTRAVENOUS | Status: AC
  Administered 2016-12-18: 300 mg via INTRAVENOUS
  Filled 2016-12-18: qty 30

## 2016-12-18 NOTE — Telephone Encounter (Signed)
Message sent to chemo scheduler to be added per 12/18/16 los. Appointments scheduled per 12/18/16 los. Patient was given 2 bottles of contrast with copy of instructions per CT ordered, and a copy of the AVS report and appointment schedule, per 12/18/16 los.

## 2016-12-18 NOTE — Patient Instructions (Signed)
[facility] Discharge Instructions for Patients Receiving Chemotherapy  Today you received the following chemotherapy agents Taxol and Carboplatin  To help prevent nausea and vomiting after your treatment, we encourage you to take your nausea medication as directed. No Zofran for 3 days. Take Compazine instead.   If you develop nausea and vomiting that is not controlled by your nausea medication, call the clinic.   BELOW ARE SYMPTOMS THAT SHOULD BE REPORTED IMMEDIATELY:  *FEVER GREATER THAN 100.5 F  *CHILLS WITH OR WITHOUT FEVER  NAUSEA AND VOMITING THAT IS NOT CONTROLLED WITH YOUR NAUSEA MEDICATION  *UNUSUAL SHORTNESS OF BREATH  *UNUSUAL BRUISING OR BLEEDING  TENDERNESS IN MOUTH AND THROAT WITH OR WITHOUT PRESENCE OF ULCERS  *URINARY PROBLEMS  *BOWEL PROBLEMS  UNUSUAL RASH Items with * indicate a potential emergency and should be followed up as soon as possible.  Feel free to call the clinic you have any questions or concerns. The clinic phone number is [phone].  Please show the CHEMO ALERT CARD at check-in to the Emergency Department and triage nurse.   

## 2016-12-19 ENCOUNTER — Telehealth: Payer: Self-pay | Admitting: *Deleted

## 2016-12-19 NOTE — Telephone Encounter (Signed)
Per 2/20 LOS and staff message I have scheduled appts. Notified the scheduler 

## 2016-12-20 ENCOUNTER — Telehealth: Payer: Self-pay | Admitting: Hematology

## 2016-12-20 NOTE — Telephone Encounter (Signed)
Called patient to inform of change appointment time for 01/01/17. Appointment date and time was confirmed with his wife, Acxel Tumminia. 12/20/16

## 2016-12-22 ENCOUNTER — Encounter: Payer: Self-pay | Admitting: Hematology

## 2016-12-24 ENCOUNTER — Telehealth: Payer: Self-pay | Admitting: *Deleted

## 2016-12-24 NOTE — Telephone Encounter (Signed)
"  I have not heard anything from scheduling for the pictures so I'm calling."  Declined offer for Radiology scheduler number.  This nurse called Radiology Centralized Scheduling.  Scheduler unable to schedule CT Chest, Abdomen, Pelvis due to request not yet authorized. Patient notified.  Awaiting call from scheduling or this office when approved and scheduled.

## 2016-12-25 ENCOUNTER — Ambulatory Visit: Payer: Medicare Other

## 2016-12-25 ENCOUNTER — Other Ambulatory Visit: Payer: Medicare Other

## 2016-12-31 ENCOUNTER — Other Ambulatory Visit: Payer: Self-pay | Admitting: Family Medicine

## 2016-12-31 ENCOUNTER — Other Ambulatory Visit: Payer: Self-pay | Admitting: Internal Medicine

## 2017-01-01 ENCOUNTER — Ambulatory Visit: Payer: Medicare Other

## 2017-01-01 ENCOUNTER — Other Ambulatory Visit: Payer: Self-pay | Admitting: Hematology

## 2017-01-01 ENCOUNTER — Other Ambulatory Visit (HOSPITAL_BASED_OUTPATIENT_CLINIC_OR_DEPARTMENT_OTHER): Payer: Medicare Other

## 2017-01-01 ENCOUNTER — Other Ambulatory Visit: Payer: Medicare Other

## 2017-01-01 ENCOUNTER — Ambulatory Visit (HOSPITAL_BASED_OUTPATIENT_CLINIC_OR_DEPARTMENT_OTHER): Payer: Medicare Other

## 2017-01-01 VITALS — BP 121/69 | HR 88 | Temp 98.5°F | Resp 20

## 2017-01-01 DIAGNOSIS — C160 Malignant neoplasm of cardia: Secondary | ICD-10-CM

## 2017-01-01 DIAGNOSIS — Z5111 Encounter for antineoplastic chemotherapy: Secondary | ICD-10-CM | POA: Diagnosis not present

## 2017-01-01 DIAGNOSIS — Z95828 Presence of other vascular implants and grafts: Secondary | ICD-10-CM

## 2017-01-01 LAB — COMPREHENSIVE METABOLIC PANEL
ALK PHOS: 80 U/L (ref 40–150)
ALT: 39 U/L (ref 0–55)
ANION GAP: 8 meq/L (ref 3–11)
AST: 40 U/L — ABNORMAL HIGH (ref 5–34)
Albumin: 3.2 g/dL — ABNORMAL LOW (ref 3.5–5.0)
BILIRUBIN TOTAL: 0.7 mg/dL (ref 0.20–1.20)
BUN: 14.1 mg/dL (ref 7.0–26.0)
CALCIUM: 8.8 mg/dL (ref 8.4–10.4)
CO2: 26 mEq/L (ref 22–29)
CREATININE: 0.9 mg/dL (ref 0.7–1.3)
Chloride: 102 mEq/L (ref 98–109)
EGFR: 84 mL/min/{1.73_m2} — AB (ref >90)
Glucose: 108 mg/dl (ref 70–140)
Potassium: 4.3 mEq/L (ref 3.5–5.1)
Sodium: 137 mEq/L (ref 136–145)
TOTAL PROTEIN: 6.3 g/dL — AB (ref 6.4–8.3)

## 2017-01-01 LAB — CBC WITH DIFFERENTIAL/PLATELET
BASO%: 0.5 % (ref 0.0–2.0)
Basophils Absolute: 0 10*3/uL (ref 0.0–0.1)
EOS ABS: 0.1 10*3/uL (ref 0.0–0.5)
EOS%: 2.5 % (ref 0.0–7.0)
HCT: 29.9 % — ABNORMAL LOW (ref 38.4–49.9)
HEMOGLOBIN: 9.5 g/dL — AB (ref 13.0–17.1)
LYMPH%: 19.1 % (ref 14.0–49.0)
MCH: 28.5 pg (ref 27.2–33.4)
MCHC: 31.8 g/dL — ABNORMAL LOW (ref 32.0–36.0)
MCV: 89.5 fL (ref 79.3–98.0)
MONO#: 0.8 10*3/uL (ref 0.1–0.9)
MONO%: 20.4 % — ABNORMAL HIGH (ref 0.0–14.0)
NEUT%: 57.5 % (ref 39.0–75.0)
NEUTROS ABS: 2.2 10*3/uL (ref 1.5–6.5)
PLATELETS: 147 10*3/uL (ref 140–400)
RBC: 3.34 10*6/uL — AB (ref 4.20–5.82)
RDW: 20.7 % — ABNORMAL HIGH (ref 11.0–14.6)
WBC: 3.9 10*3/uL — AB (ref 4.0–10.3)
lymph#: 0.7 10*3/uL — ABNORMAL LOW (ref 0.9–3.3)

## 2017-01-01 MED ORDER — SODIUM CHLORIDE 0.9 % IV SOLN
Freq: Once | INTRAVENOUS | Status: AC
  Administered 2017-01-01: 13:00:00 via INTRAVENOUS

## 2017-01-01 MED ORDER — DEXAMETHASONE SODIUM PHOSPHATE 10 MG/ML IJ SOLN
10.0000 mg | Freq: Once | INTRAMUSCULAR | Status: AC
  Administered 2017-01-01: 10 mg via INTRAVENOUS

## 2017-01-01 MED ORDER — FAMOTIDINE IN NACL 20-0.9 MG/50ML-% IV SOLN
20.0000 mg | Freq: Once | INTRAVENOUS | Status: AC
  Administered 2017-01-01: 20 mg via INTRAVENOUS

## 2017-01-01 MED ORDER — SODIUM CHLORIDE 0.9% FLUSH
10.0000 mL | INTRAVENOUS | Status: DC | PRN
  Administered 2017-01-01: 10 mL via INTRAVENOUS
  Filled 2017-01-01: qty 10

## 2017-01-01 MED ORDER — DIPHENHYDRAMINE HCL 50 MG/ML IJ SOLN
25.0000 mg | Freq: Once | INTRAMUSCULAR | Status: AC
  Administered 2017-01-01: 25 mg via INTRAVENOUS

## 2017-01-01 MED ORDER — FAMOTIDINE IN NACL 20-0.9 MG/50ML-% IV SOLN
INTRAVENOUS | Status: AC
Start: 2017-01-01 — End: 2017-01-01
  Filled 2017-01-01: qty 50

## 2017-01-01 MED ORDER — SODIUM CHLORIDE 0.9% FLUSH
10.0000 mL | INTRAVENOUS | Status: DC | PRN
  Administered 2017-01-01: 10 mL
  Filled 2017-01-01: qty 10

## 2017-01-01 MED ORDER — PALONOSETRON HCL INJECTION 0.25 MG/5ML
INTRAVENOUS | Status: AC
  Filled 2017-01-01: qty 5

## 2017-01-01 MED ORDER — DEXAMETHASONE SODIUM PHOSPHATE 10 MG/ML IJ SOLN
INTRAMUSCULAR | Status: AC
  Filled 2017-01-01: qty 1

## 2017-01-01 MED ORDER — PALONOSETRON HCL INJECTION 0.25 MG/5ML
0.2500 mg | Freq: Once | INTRAVENOUS | Status: AC
  Administered 2017-01-01: 0.25 mg via INTRAVENOUS

## 2017-01-01 MED ORDER — PACLITAXEL CHEMO INJECTION 300 MG/50ML
70.0000 mg/m2 | Freq: Once | INTRAVENOUS | Status: AC
  Administered 2017-01-01: 180 mg via INTRAVENOUS
  Filled 2017-01-01: qty 30

## 2017-01-01 MED ORDER — HEPARIN SOD (PORK) LOCK FLUSH 100 UNIT/ML IV SOLN
500.0000 [IU] | Freq: Once | INTRAVENOUS | Status: AC | PRN
  Administered 2017-01-01: 500 [IU]
  Filled 2017-01-01: qty 5

## 2017-01-01 MED ORDER — CARBOPLATIN CHEMO INJECTION 450 MG/45ML
300.0000 mg | Freq: Once | INTRAVENOUS | Status: AC
  Administered 2017-01-01: 300 mg via INTRAVENOUS
  Filled 2017-01-01: qty 30

## 2017-01-01 MED ORDER — DIPHENHYDRAMINE HCL 50 MG/ML IJ SOLN
INTRAMUSCULAR | Status: AC
Start: 2017-01-01 — End: 2017-01-01
  Filled 2017-01-01: qty 1

## 2017-01-01 NOTE — Telephone Encounter (Signed)
Rx faxed

## 2017-01-01 NOTE — Patient Instructions (Signed)
[facility] Discharge Instructions for Patients Receiving Chemotherapy  Today you received the following chemotherapy agents taxol/carboplatin  To help prevent nausea and vomiting after your treatment, we encourage you to take your nausea medication as directed   If you develop nausea and vomiting that is not controlled by your nausea medication, call the clinic.   BELOW ARE SYMPTOMS THAT SHOULD BE REPORTED IMMEDIATELY:  *FEVER GREATER THAN 100.5 F  *CHILLS WITH OR WITHOUT FEVER  NAUSEA AND VOMITING THAT IS NOT CONTROLLED WITH YOUR NAUSEA MEDICATION  *UNUSUAL SHORTNESS OF BREATH  *UNUSUAL BRUISING OR BLEEDING  TENDERNESS IN MOUTH AND THROAT WITH OR WITHOUT PRESENCE OF ULCERS  *URINARY PROBLEMS  *BOWEL PROBLEMS  UNUSUAL RASH Items with * indicate a potential emergency and should be followed up as soon as possible.  Feel free to call the clinic you have any questions or concerns. The clinic phone number is [phone].  

## 2017-01-05 DIAGNOSIS — J449 Chronic obstructive pulmonary disease, unspecified: Secondary | ICD-10-CM | POA: Diagnosis not present

## 2017-01-07 ENCOUNTER — Encounter (HOSPITAL_COMMUNITY): Payer: Self-pay

## 2017-01-07 ENCOUNTER — Ambulatory Visit (HOSPITAL_COMMUNITY)
Admission: RE | Admit: 2017-01-07 | Discharge: 2017-01-07 | Disposition: A | Discharge status: Home / Self Care | Payer: Medicare Other | Source: Ambulatory Visit | Attending: Hematology | Admitting: Hematology

## 2017-01-07 DIAGNOSIS — I2510 Atherosclerotic heart disease of native coronary artery without angina pectoris: Secondary | ICD-10-CM | POA: Diagnosis not present

## 2017-01-07 DIAGNOSIS — J90 Pleural effusion, not elsewhere classified: Secondary | ICD-10-CM | POA: Insufficient documentation

## 2017-01-07 DIAGNOSIS — C160 Malignant neoplasm of cardia: Secondary | ICD-10-CM | POA: Insufficient documentation

## 2017-01-07 DIAGNOSIS — I2584 Coronary atherosclerosis due to calcified coronary lesion: Secondary | ICD-10-CM | POA: Diagnosis not present

## 2017-01-07 DIAGNOSIS — R918 Other nonspecific abnormal finding of lung field: Secondary | ICD-10-CM | POA: Diagnosis not present

## 2017-01-07 DIAGNOSIS — I700 Atherosclerosis of aorta: Secondary | ICD-10-CM | POA: Diagnosis not present

## 2017-01-07 DIAGNOSIS — K8020 Calculus of gallbladder without cholecystitis without obstruction: Secondary | ICD-10-CM | POA: Diagnosis not present

## 2017-01-07 DIAGNOSIS — C159 Malignant neoplasm of esophagus, unspecified: Secondary | ICD-10-CM | POA: Diagnosis not present

## 2017-01-07 DIAGNOSIS — K7460 Unspecified cirrhosis of liver: Secondary | ICD-10-CM | POA: Diagnosis not present

## 2017-01-07 MED ORDER — IOPAMIDOL (ISOVUE-300) INJECTION 61%
INTRAVENOUS | Status: AC
  Administered 2017-01-07: 100 mL
  Filled 2017-01-07: qty 100

## 2017-01-09 NOTE — Progress Notes (Signed)
[name] 71 y.o. 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   Pain: none  Skin:none Nausea/Vomiting: none Swallowing problems/pain/swallowing dificulity: none with food,  , trouble with liquids, like swallowing air with it, feels like swallowing a brink Appetite: good Weight:   272.2lb                                                                 Fatigue: mild 12-18-16 Saw Dr. [name] Weekly [name] and taxol every 2 weeks  Will return for follow up in 4 weeks.  CT restaging scan in 1 month. Results in 01/07/17 BP 134/77 (BP Location: Right Arm, Patient Position: Sitting, Cuff Size: Normal)   Pulse (!) 113   Temp 98.3 F (36.8 C) (Oral)   Resp 20   Ht 5\' 9"  (1.753 m)   Wt 272 lb 3.2 oz (123.5 kg)   SpO2 100% Comment: 2 liters n/c oxygen  BMI 40.20 kg/m   Wt Readings from Last 3 Encounters:  01/22/17 272 lb 3.2 oz (123.5 kg)  01/15/17 273 lb 3.2 oz (123.9 kg)  12/18/16 268 lb 6.4 oz (121.7 kg)

## 2017-01-11 NOTE — Progress Notes (Signed)
[name]  [phone] [phone]  Clinic Follow up Note   Patient Care Team: Tammi Sou, MD as PCP - General (Family Medicine) Ulyses Southward, MD as Consulting Physician (Allergy and Immunology) Calton Dach, MD as Consulting Physician (Optometry) Sueanne Margarita, MD as Consulting Physician (Cardiology) Jerene Bears, MD as Consulting Physician (Gastroenterology) Latanya Maudlin, MD as Consulting Physician (Orthopedic Surgery) Jodi Marble, MD as Consulting Physician (Otolaryngology) Truitt Merle, MD as Consulting Physician (Hematology) Kyung Rudd, MD as Consulting Physician (Radiation Oncology) 01/15/2017    CHIEF COMPLAINTS:  Follow up GE junction cancer  Oncology History   Cancer of cardio-esophageal junction [facility])   Staging form: Stomach, AJCC 7th Edition     Clinical stage from 05/04/2016: Stage IV ([place], N2, M1) - Signed by Truitt Merle, MD on 05/18/2016        Cancer of cardio-esophageal junction ([place])   05/04/2016 Initial Diagnosis    Cancer of cardio-esophageal junction ([place])      05/04/2016 Procedure    EGD showed a large ulcerating mass with no active bleeding at the gastroesophageal junction extending into the gastric cardia. The mass was not obstructing and partially circumferential, extends approximately 5 cm. Nonbleeding erosive gastropathy.      05/04/2016 Initial Biopsy    Esophageal gastric junction biopsy showed a poorly differentiated carcinoma underlying the squamous mucosa. There is lymphovascular invasion. No Intestinal metaplasia. IHC weakly positive CK5/6, p63 (-), favor squamous.        05/09/2016 Imaging    CT CAP w contrast showed mild wall thickening involving the distal esophagus and proximal stomach compatible with known cancer, enlarged mediastinal and upper abdominal lymph nodes are highly suspicious for metastatic adenopathy, propable liver cirrhosis.      06/04/2016 - 06/22/2016 Radiation Therapy   palliative radiation to esophageal cancer       07/06/2016 -  Chemotherapy    Weekly carbo and taxol      07/30/2016 Pathology Results    PD-L1 negative expression      08/28/2016 - 08/31/2016 Hospital Admission    The patient was admitted to 4 severe hypoglycemia, dehydration, and acute renal failure. Infection workup was negative. He recovered well with supportive care. His insulin and hypertension medication was held on discharge, except insulin sliding scale.      09/24/2016 Imaging    CT CAP W CONTRAST 09/26/2016 IMPRESSION: Decreased masslike soft tissue prominence at gastroesophageal junction, consistent with decreased size of primary gastroesophageal junction carcinoma. Resolution of paraesophageal and gastrohepatic ligament lymphadenopathy since prior exam. Other sub-cm mediastinal lymph nodes show little or no significant change. Stable indeterminate sub-cm low-attenuation lesion in the liver dome. Probable hepatic cirrhosis. Recommend continued attention on follow-up CT. No new or progressive metastatic disease identified. Cholelithiasis.  No radiographic evidence of cholecystitis. Colonic diverticulosis. No radiographic evidence of diverticulitis. Aortic atherosclerosis and three-vessel coronary artery calcification.      01/07/2017 Imaging    CT CAP w contrast 1. No residual esophageal mass or adenopathy. 2. New nodular airspace opacification in the left lower lobe. Continued attention on followup exams is warranted as metastatic disease cannot be definitively excluded. 3. Cirrhosis. 4. Trace left pleural effusion. 5. Aortic atherosclerosis (ICD10-170.0). Coronary artery calcification. 6. Cholelithiasis. 7. Mild basilar predominant subpleural reticular densities. Difficult to exclude interstitial lung disease.       HISTORY OF PRESENTING ILLNESS:  [name] 71 y.o. male is here because of His reason that diagnosed GE junction carcinoma. He  is a comment  of by his wife and daughter to our multidisciplinary chart clinic today.  He has been having progressive dysphagia and odynophagia for 2 month, he has been eating soft diet only in the past one week. He has pain in the mid chest and epigastric area only when he eats, with burning sensation, no nausea, abdominal bloating, or other discomfort. His appetite has remained well, no weight loss,   He has multiple medical problems, especially heart failure, he has been on oxygen continuously for 5-6 years. He is morbidly obese, has diabetes, hypertension, mild neuropathy, OSA etc. He has very sedentary life style, he sits in the chair and watch TV most of time during the day. He only comes out for shopping for a few times a week. He uses a [employer], he can walk for a few hundrends feet before he has to stop to catch his breasts. He denies cough or sputum production, no GI discomfort, he has been having mild dirrhea, loose stool, twice daily, no melena or hematochezia.  CURRENT THERAPY: chemotherapy with weekly carboplatin and taxol,  started on 07/06/2016, held 08/21/16-09/28/2016 due to hospitalization, changed to every 2 weeks from 11/30/2016  INTERIM HISTORY:  [name] returns for follow-up. He is doing well today. After treatment, he has some diarrhea for a couple of days, but it resolves itself. He does not take Imodium. He has some mild joint pain in his hands and ankles from arthritis. Occasionally food will get stuck in his throat, but he says that this isn't regular. Denies numbness, tingling, cough, SOB, loss of appetite, weight loss, or any other concerns.   MEDICAL HISTORY:  Past Medical History:  Diagnosis Date  . Asthma    as a child  . Cataract    multiple types, bilateral  . Chronic diastolic CHF (congestive heart failure) ([place]) 02/25/2009  . Chronic renal insufficiency, stage III (moderate) 2017   Stage II/III (GFR around 60)  . 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 COPD (chronic obstructive pulmonary disease) ([place])   . Coronary artery disease    chronically occluded LAD and diagonal with right to left collaterals, mild disease in the left circ and moderate disease in the mid RCA on medical management with Imdur, ASA, and Plavix.  . Dermatitis 05/2014  . DIABETES MELLITUS, TYPE II 06/24/2007   No diab retpthy as of 08/05/15 eye exam.  . Esophageal cancer ([place]) 04/2016   poorly differentiated carcinoma (Dr. Pyrtle--EGD).  CT C/A/P showed metastatic adenopathy in mediastinum and upper abdomen 05/09/16.  Tx plan is palliative radiation (completed 06/22/16), then palliative systemic chemotherapy (carbo+taxol) was started but as of 08/03/16 onc f/u this was held due to severe knee and ankle arthralgias.  Restarting as of 10/2015 (08/2016 CT showed some disease regression  . Essential hypertension 05/01/2007   Qualifier: Diagnosis of  By: Tiney Rouge CMA, Ellison Hughs     . History of kidney stones   . Hyperkalemia 12/2015   Decreased ACE-I by 50% in response, then potassium normalized.  Marland Kitchen HYPERLIPIDEMIA 06/24/2007  . Hypoxemia 01/27/2010  . Myocardial infarction    pt states he was informed per MD that he has had one but pt was unaware   . OBESITY 09/02/2008  . Obesity hypoventilation syndrome (HCC)    oxygen 24/7 (2 liters [place] as of 02/2016)  . On home oxygen therapy    Oxygen @ 2l/m nasally  24/7 hours  . OSA (obstructive sleep apnea)    not tested; pt scored 4 per stop bang tool results sent to PCP   . OSTEOARTHRITIS 05/01/2007  . Presbycusis of both ears 05/2015   [facility]  . Pruritic condition 05/2014   Allergist summer 2015, no new testing.  [name] field defect 05/10/16   Per Dr. Melina Fiddler, O.D.: Rt, loss of inf/temp quad and some loss sup/temp.  ?CVA  ? Pituitary tumor? ?Brain met.    SURGICAL HISTORY: Past Surgical History:  Procedure Laterality Date  . APPENDECTOMY    . BALLOON DILATION N/A 05/04/2016   Procedure:  BALLOON DILATION;  Surgeon: Jerene Bears, MD;  Location: [facility];  Service: Gastroenterology;  Laterality: N/A;  . CARDIAC CATHETERIZATION    . CARPAL TUNNEL RELEASE Left   . CATARACT EXTRACTION W/PHACO Right 04/29/2013   Procedure: CATARACT EXTRACTION PHACO AND INTRAOCULAR LENS PLACEMENT (IOC);  Surgeon: Adonis Brook, MD;  Location: [place];  Service: Ophthalmology;  Laterality: Right;  . CATARACT EXTRACTION, BILATERAL    . COLONOSCOPY W/ POLYPECTOMY  08/2011   Many polyps--all hyperplastic, severe diverticulosis, int hem.  [employer] hemoccult testing via [facility] 06/22/15 NEG  . ESOPHAGOGASTRODUODENOSCOPY N/A 05/04/2016   Procedure: ESOPHAGOGASTRODUODENOSCOPY (EGD);  Surgeon: Jerene Bears, MD;  Location: [facility];  Service: Gastroenterology;  Laterality: N/A;  . IR GENERIC HISTORICAL  10/03/2016   IR US GUIDE VASC ACCESS RIGHT 10/03/2016 Greggory Keen, MD [facility]  . IR GENERIC HISTORICAL  10/03/2016   IR FLUORO GUIDE PORT INSERTION RIGHT 10/03/2016 Greggory Keen, MD [facility]  . KNEE ARTHROSCOPY WITH MEDIAL MENISECTOMY Right 12/16/2014   Procedure: RIGHT KNEE ARTHROSCOPY WITH MEDIAL MENISECTOMY microfracture medial femoral condyle abrasion condroplasty medial femoral condyle lateral menisectomy;  Surgeon: Tobi Bastos, MD;  Location: [facility];  Service: Orthopedics;  Laterality: Right;  . LEFT HEART CATHETERIZATION WITH CORONARY ANGIOGRAM N/A 10/26/2013   Procedure: LEFT HEART CATHETERIZATION WITH CORONARY ANGIOGRAM;  Surgeon: Jettie Booze, MD;  Location: [facility];  Service: Cardiovascular;  Laterality: N/A;  . LUMBAR [place] SURGERY  2001  . SHOULDER SURGERY Right    right fx  . TONSILLECTOMY    . TRANSTHORACIC ECHOCARDIOGRAM  03/2016   EF 55-60%, grade I DD, LAE  . WRIST SURGERY Right    right fx    SOCIAL HISTORY: Social History   Social History  . Marital status: Married    Spouse name: susan  . Number of children: 2  . Years of education: N/A   Occupational  History  . Retired    Social History Main Topics  . Smoking status: Former Smoker    Packs/day: 1.50    Years: 30.00    Types: Cigarettes, Pipe, Cigars    Quit date: 10/30/1983  . Smokeless tobacco: Former Systems developer    Types: Chew    Quit date: 05/15/2016  . Alcohol use No  . Drug use: No  . Sexual activity: Not Currently   Other Topics Concern  . Not on file   Social History Narrative   Married, one son and one daughter.   His daughter and her two children live with him.   Coffee daily.  Former smoker.  No alcohol.   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   Attends church weekly-   Oxygen continuous       FAMILY HISTORY: Family History  Problem Relation Age of Onset  . Heart attack Mother   .  Aneurysm Father   . Alcohol abuse Father   . Diabetes Maternal Aunt   . Colon cancer Neg Hx     ALLERGIES:  has No Known Allergies.  MEDICATIONS:  Current Outpatient Prescriptions  Medication Sig Dispense Refill  . aspirin 81 MG tablet Take 81 mg by mouth daily.      Marland Kitchen atorvastatin (LIPITOR) 80 MG tablet TAKE ONE TABLET BY MOUTH ONCE DAILY 90 tablet 2  . beta carotene w/minerals (OCUVITE) tablet Take 1 tablet by mouth daily.    . cetirizine (ZYRTEC) 10 MG tablet Take 10 mg by mouth daily.    . citalopram (CELEXA) 20 MG tablet TAKE ONE TABLET BY MOUTH ONCE DAILY 90 tablet 1  . clonazePAM (KLONOPIN) 1 MG tablet TAKE ONE TABLET BY MOUTH TWICE DAILY AS NEEDED FOR ANXIETY 60 tablet 5  . cromolyn (OPTICROM) 4 % ophthalmic solution Place 1 drop into both eyes 4 (four) times daily.    Marland Kitchen gabapentin (NEURONTIN) 300 MG capsule TAKE ONE CAPSULE BY MOUTH THREE TIMES DAILY 270 capsule 3  . hydrOXYzine (ATARAX/VISTARIL) 25 MG tablet TAKE 1 TABLET BY MOUTH AT SUPPER AND 1 TABLET BY MOUTH AT BEDTIME FOR INSOMNIA 60 tablet 6  . insulin lispro (HUMALOG KWIKPEN) 100 UNIT/ML KiwkPen Inject into the skin. SS at meal time    . Insulin NPH, Human,, Isophane, (HUMULIN N) 100  UNIT/ML Kiwkpen Inject into the skin. 7 units every morning and 7 units every evening    . magnesium chloride (SLOW-MAG) 64 MG TBEC SR tablet Take 1 tablet (64 mg total) by mouth daily. 60 tablet 0  . metFORMIN (GLUCOPHAGE) 1000 MG tablet TAKE ONE TABLET BY MOUTH TWICE DAILY WITH MEALS 180 tablet 1  . Omega-3 Fatty Acids ([employer] FISH OIL) 1000 MG CAPS Take 2 tablets by mouth 2 (two) times daily. 90 capsule   . ondansetron (ZOFRAN) 8 MG tablet Take 1 tablet (8 mg total) by mouth 2 (two) times daily as needed for refractory nausea / vomiting. Start on day 3 after chemo. 30 tablet 1  . ONE TOUCH ULTRA TEST test strip     . ONETOUCH DELICA LANCETS 25D MISC     . oxyCODONE (OXY IR/ROXICODONE) 5 MG immediate release tablet Take 1 tablet (5 mg total) by mouth every 6 (six) hours as needed for severe pain. 60 tablet 0  . OXYGEN Inhale 2 L/min into the lungs continuous.    . pantoprazole (PROTONIX) 40 MG tablet TAKE ONE TABLET BY MOUTH TWICE DAILY 60 tablet 3  . prochlorperazine (COMPAZINE) 10 MG tablet Take 1 tablet (10 mg total) by mouth every 6 (six) hours as needed (Nausea or vomiting). 30 tablet 1  . ZETIA 10 MG tablet TAKE ONE TABLET BY MOUTH ONCE DAILY 90 tablet 1   No current facility-administered medications for this visit.     REVIEW OF SYSTEMS:   Constitutional: Denies fevers, abnormal night sweats,  Eyes: Denies blurriness of vision, double vision or watery eyes Ears, nose, mouth, throat, and face: Denies mucositis or sore throat, (+) trouble swallowing Respiratory: Denies cough, dyspnea or wheezes Cardiovascular: Denies palpitation, chest discomfort or lower extremity swelling Gastrointestinal:  Denies nausea, heartburn or change in bowel habits (+) diarrhea Skin: Denies abnormal skin rashes Lymphatics: Denies new lymphadenopathy or easy bruising Neurological:Denies numbness, tingling or new weaknesses  Behavioral/Psych: Mood is stable, no new changes  All other systems were reviewed with  the patient and are negative.  PHYSICAL EXAMINATION: ECOG PERFORMANCE STATUS: 3 - Symptomatic, >50%  confined to bed  Vitals:   01/15/17 0851  BP: 110/62  Pulse: 100  Resp: 19  Temp: 99.5 F (37.5 C)   Filed Weights   01/15/17 0851  Weight: 273 lb 3.2 oz (123.9 kg)   GENERAL:alert, no distress and comfortable, morbid obese, sitting in wheelchair with nasal cannula oxygen  SKIN: skin color, texture, turgor are normal, no rashes or significant lesions EYES: normal, conjunctiva are pink and non-injected, sclera clear OROPHARYNX:no exudate, no erythema and lips, buccal mucosa, and tongue normal  NECK: supple, thyroid normal size, non-tender, without nodularity LYMPH:  no palpable lymphadenopathy in the cervical, axillary or inguinal LUNGS: clear, no wheezing HEART: regular rate & rhythm and no murmurs and no lower extremity edema ABDOMEN:abdomen soft, non-tender and normal bowel sounds Musculoskeletal:no cyanosis of digits and no clubbing  PSYCH: alert & oriented x 3 with fluent speech NEURO: no focal motor/sensory deficits EXT: Trace edema, he wears compression stocks, no significant ankle swollen, erythema, or warmness.   LABORATORY DATA:  I have reviewed the data as listed CBC Latest Ref Rng & Units 01/15/2017 01/01/2017 12/18/2016  WBC 4.0 - 10.3 10e3/uL 2.7(L) 3.9(L) 4.2  Hemoglobin 13.0 - 17.1 g/dL 9.8(L) 9.5(L) 9.1(L)  Hematocrit 38.4 - 49.9 % 30.2(L) 29.9(L) 27.9(L)  Platelets 140 - 400 10e3/uL 109(L) 147 195   CMP Latest Ref Rng & Units 01/01/2017 12/18/2016 11/30/2016  Glucose 70 - 140 mg/dl 108 106 126  BUN 7.0 - 26.0 mg/dL 14.1 13.7 8.9  Creatinine 0.7 - 1.3 mg/dL 0.9 0.8 0.7  Sodium 136 - 145 mEq/L 137 137 140  Potassium 3.5 - 5.1 mEq/L 4.3 4.0 4.0  Chloride 96 - 106 mmol/L - - -  CO2 22 - 29 mEq/L '26 27 27  ' Calcium 8.4 - 10.4 mg/dL 8.8 8.9 8.9  Total Protein 6.4 - 8.3 g/dL 6.3(L) 6.4 6.1(L)  Total Bilirubin 0.20 - 1.20 mg/dL 0.70 0.51 0.64  Alkaline Phos 40 - 150 U/L  80 81 72  AST 5 - 34 U/L 40(H) 25 38(H)  ALT 0 - 55 U/L 39 20 32   ANC 1.6  PATHOLOGY REPORT Diagnosis 05/04/2016 1. Esophagogastric junction, biopsy, ulcerative mass - POORLY DIFFERENTIATED CARCINOMA, SEE COMMENT. 2. Stomach, biopsy - REACTIVE GASTROPATHY. - NEGATIVE FOR HELICOBACTER PYLORI. - NO INTESTINAL METAPLASIA, DYSPLASIA, OR MALIGNANCY. Microscopic Comment 1. The majority of the specimen consists of gastroesophageal mucosa with reflux changes. There is a small focus of tumor underlying the squamous mucosa. There is lymphovascular invasion. There is no background intestinal metaplasia (Barrett's esophagus). Immunohistochemistry reveals only very focal weak cytokeratin 5/6, negative p63, and negative mucicarmine in the limited remaining tumor. Dr. Saralyn Pilar has reviewed the case. The case was called to Dr. Hilarie Fredrickson on 05/08/2016. 2. A Warthin-Starry stain is performed to determine the possibility of the presence of Helicobacter pylori. The Warthin-Starry stain is negative for organisms of Helicobacter pylori.     RADIOGRAPHIC STUDIES: I have personally reviewed the radiological images as listed and agreed with the findings in the report.  CT CAP w contrast 01/07/2017 IMPRESSION: 1. No residual esophageal mass or adenopathy. 2. New nodular airspace opacification in the left lower lobe. Continued attention on followup exams is warranted as metastatic disease cannot be definitively excluded. 3. Cirrhosis. 4. Trace left pleural effusion. 5. Aortic atherosclerosis (ICD10-170.0). Coronary artery calcification. 6. Cholelithiasis. 7. Mild basilar predominant subpleural reticular densities. Difficult to exclude interstitial lung disease.  EGD 05/04/2016 A large, ulcerating mass with no active bleeding and no stigmata of recent  bleeding was found at the gastroesophageal junction extending into the gastric cardia, beginning 40 cm from the incisors. The mass was non-obstructing and  partially circumferential (involving one-half of the lumen circumference). The mass extends approximately 5 cm. IMPRESSION:  -Likely malignant esophageal tumor was found at the gastroesophageal junction extending into the gastric cardia. Biopsied. - Non-bleeding erosive gastropathy. Biopsied. - Erythematous duodenopathy. - Normal second portion of the duodenum.  ASSESSMENT & PLAN: 71 y.o. Caucasian male, with multiple comorbidities, including hypertension, diabetes, OSA, coronary artery disease, diastolic CHF, on continuous oxygen, morbid obesity, presented with progressive dysphagia and odynophagia.  1. Cancer of cardio-esophageal junction, poorly differentiated, [id number], stage IV  -I previously reviewed his CT scan, EGD, and the biopsy results with patient and his family members in great details. -His biopsy pathology was previously reviewed in our tumor board, the morphology and weakly positive for CK 5/6, fevers squamous cell carcinoma -His CT scan findings are very concerning for distant metastasis to abdominal lymph nodes.  -I previously reviewed the PET scan images with patient and his daughter in person, he has hypermetabolic GE junction tumor, and diffuse adenopathy in mediastinum and upper abdomen. -We previously discussed the aggressive nature of esophageal cancer, an incurable nature of his disease due to the distant metastasis. The goal of therapy is palliative, to prolong his life and palliate his symptoms. -His tumor was negative for PD-L1, not a candidate for immunotherapy  -He is on first line palliative chemotherapy with weekly carbo and Taxol, every 2 weeks, tolerating better lately. -I previously reviewed his CT scan findings on 09/26/2016 show good partial response to chemoradiation, no new or progressive metastatic disease. - I reviewed his restaging CT scan from 01/07/2017,  Which showed n residual esophageal mass or adenopathy, he has mild new infiltrative change in LLL,  possible related to aspiration, he is asymptomatic, will continue monitoring  -He is clinically doing well, lab results reviewed with patient, mild pancytopenia, secondary to chemotherapy, adequate for treatment, we'll continue low-dose carboplatin and Taxol every 2 weeks  2. CAD, diastolic CHF, on continuous oxygen -He will continue follow-up with his cardiologist Dr. Radford Pax -His Plavix has been held since the EGD and biopsy, he is on baby aspirin. -Need to watch his fluid intake during his chemotherapy, and also avoid fluid overload from chemo  -he is back on lasix, dsypnea improved   3. DM, HTN -We previously discussed his blood pressure and glucose need to be monitored closely during the chemotherapy, which may affect his blood glucose and blood pressure -He will continue medication, monitor his sugar and blood pressure at home, and follow-up with his primary care physician. -will decrease premed dexa before taxol  -History simple has been held due to his borderline low blood pressure -He will see his primary care physician Dr. Anitra Lauth for diabetes management.  4. OSA, morbid obesity -We previously discussed healthy diet, I encouraged him to to be more physically active, and consider home PT -I strongly encouraged him to follow-up with dietitian during the therapy for nutrition support.  5. Epigastric pain -Likely secondary to his underlying malignancy -worse after radiation, resolved now  -continue tramadol as need, he has not need it lately.  6. Arthralgia -He has baseline osteoarthritis, not physically active -However his arthralgia in his knees and ankle are much worse after chemotherapy but resolved now  -He will use oxycodone 5 mg as needed, constipation management previously reviewed with him. -Leg weakness improved with physical therapy. Previously encouraged the patient to  continue with physical therapy.   7. Anemia -Second is to underlying malignancy and  chemotherapy -Labs pending today.  -we'll consider blood transfusion if hemoglobin less than 8, for symptomatic anemia.  8. Goal of care discussion  -We previously discussed the incurable nature of his cancer, and the overall poor prognosis, especially if he does not have good response to chemotherapy or progress on chemo -The patient understands the goal of care is palliative. -Patient is very realistic, understands his cancer is not curable, and he agrees with DO NOT RESUSCITATE and DO NOT INTUBATE  9. Diarrhea -For a couple of days after treatment. He does not take Imodium -I encouraged him to take Imodium for these days.   PLAN -Scan and lab results reviewed with patient. ANC 1.6, platelet 109K, adequate for treatment. He will receive low dose chemo carboplatin and Taxol treatment today, and continue every 2 weeks  -The patient will return for follow up in 4 weeks.   All questions were answered. The patient knows to call the clinic with any problems, questions or concerns.  I spent 20 minutes counseling the patient face to face. The total time spent in the appointment was 25 minutes and more than 35% was on counseling.  This document serves as a record of services personally performed by Truitt Merle, MD. It was created on her behalf by Martinique Casey, a trained medical scribe. The creation of this record is based on the scribe's personal observations and the provider's statements to them. This document has been checked and approved by the attending provider.  I have reviewed the above documentation for accuracy and completeness and I agree with the above.    Truitt Merle, MD 01/15/2017

## 2017-01-13 ENCOUNTER — Other Ambulatory Visit: Payer: Self-pay | Admitting: Cardiology

## 2017-01-13 ENCOUNTER — Other Ambulatory Visit: Payer: Self-pay | Admitting: Family Medicine

## 2017-01-15 ENCOUNTER — Telehealth: Payer: Self-pay | Admitting: Hematology

## 2017-01-15 ENCOUNTER — Ambulatory Visit (HOSPITAL_BASED_OUTPATIENT_CLINIC_OR_DEPARTMENT_OTHER): Payer: Medicare Other | Admitting: Hematology

## 2017-01-15 ENCOUNTER — Ambulatory Visit: Payer: Medicare Other

## 2017-01-15 ENCOUNTER — Encounter: Payer: Self-pay | Admitting: Hematology

## 2017-01-15 ENCOUNTER — Ambulatory Visit (HOSPITAL_BASED_OUTPATIENT_CLINIC_OR_DEPARTMENT_OTHER): Payer: Medicare Other

## 2017-01-15 ENCOUNTER — Other Ambulatory Visit (HOSPITAL_BASED_OUTPATIENT_CLINIC_OR_DEPARTMENT_OTHER): Payer: Medicare Other

## 2017-01-15 VITALS — BP 110/62 | HR 100 | Temp 99.5°F | Resp 19 | Ht 69.0 in | Wt 273.2 lb

## 2017-01-15 DIAGNOSIS — C160 Malignant neoplasm of cardia: Secondary | ICD-10-CM

## 2017-01-15 DIAGNOSIS — D630 Anemia in neoplastic disease: Secondary | ICD-10-CM | POA: Diagnosis not present

## 2017-01-15 DIAGNOSIS — I5032 Chronic diastolic (congestive) heart failure: Secondary | ICD-10-CM | POA: Diagnosis not present

## 2017-01-15 DIAGNOSIS — Z5111 Encounter for antineoplastic chemotherapy: Secondary | ICD-10-CM | POA: Diagnosis not present

## 2017-01-15 DIAGNOSIS — E119 Type 2 diabetes mellitus without complications: Secondary | ICD-10-CM

## 2017-01-15 DIAGNOSIS — Z95828 Presence of other vascular implants and grafts: Secondary | ICD-10-CM

## 2017-01-15 DIAGNOSIS — I10 Essential (primary) hypertension: Secondary | ICD-10-CM

## 2017-01-15 DIAGNOSIS — G893 Neoplasm related pain (acute) (chronic): Secondary | ICD-10-CM

## 2017-01-15 LAB — CBC WITH DIFFERENTIAL/PLATELET
BASO%: 0.9 % (ref 0.0–2.0)
Basophils Absolute: 0 10*3/uL (ref 0.0–0.1)
EOS%: 1.9 % (ref 0.0–7.0)
Eosinophils Absolute: 0.1 10*3/uL (ref 0.0–0.5)
HCT: 30.2 % — ABNORMAL LOW (ref 38.4–49.9)
HEMOGLOBIN: 9.8 g/dL — AB (ref 13.0–17.1)
LYMPH%: 18.5 % (ref 14.0–49.0)
MCH: 29.2 pg (ref 27.2–33.4)
MCHC: 32.4 g/dL (ref 32.0–36.0)
MCV: 90 fL (ref 79.3–98.0)
MONO#: 0.5 10*3/uL (ref 0.1–0.9)
MONO%: 19.4 % — ABNORMAL HIGH (ref 0.0–14.0)
NEUT%: 59.3 % (ref 39.0–75.0)
NEUTROS ABS: 1.6 10*3/uL (ref 1.5–6.5)
Platelets: 109 10*3/uL — ABNORMAL LOW (ref 140–400)
RBC: 3.35 10*6/uL — ABNORMAL LOW (ref 4.20–5.82)
RDW: 20.6 % — ABNORMAL HIGH (ref 11.0–14.6)
WBC: 2.7 10*3/uL — ABNORMAL LOW (ref 4.0–10.3)
lymph#: 0.5 10*3/uL — ABNORMAL LOW (ref 0.9–3.3)

## 2017-01-15 LAB — COMPREHENSIVE METABOLIC PANEL
ALT: 42 U/L (ref 0–55)
ANION GAP: 10 meq/L (ref 3–11)
AST: 42 U/L — ABNORMAL HIGH (ref 5–34)
Albumin: 3.2 g/dL — ABNORMAL LOW (ref 3.5–5.0)
Alkaline Phosphatase: 70 U/L (ref 40–150)
BILIRUBIN TOTAL: 0.75 mg/dL (ref 0.20–1.20)
BUN: 10.7 mg/dL (ref 7.0–26.0)
CO2: 26 meq/L (ref 22–29)
CREATININE: 0.8 mg/dL (ref 0.7–1.3)
Calcium: 8.7 mg/dL (ref 8.4–10.4)
Chloride: 103 mEq/L (ref 98–109)
EGFR: >90 mL/min/{1.73_m2} (ref >90)
GLUCOSE: 106 mg/dL (ref 70–140)
Potassium: 4.1 mEq/L (ref 3.5–5.1)
SODIUM: 140 meq/L (ref 136–145)
TOTAL PROTEIN: 6.2 g/dL — AB (ref 6.4–8.3)

## 2017-01-15 MED ORDER — SODIUM CHLORIDE 0.9 % IV SOLN
300.0000 mg | Freq: Once | INTRAVENOUS | Status: AC
  Administered 2017-01-15: 300 mg via INTRAVENOUS
  Filled 2017-01-15: qty 30

## 2017-01-15 MED ORDER — PALONOSETRON HCL INJECTION 0.25 MG/5ML
0.2500 mg | Freq: Once | INTRAVENOUS | Status: AC
  Administered 2017-01-15: 0.25 mg via INTRAVENOUS

## 2017-01-15 MED ORDER — DEXAMETHASONE SODIUM PHOSPHATE 10 MG/ML IJ SOLN
INTRAMUSCULAR | Status: AC
  Filled 2017-01-15: qty 1

## 2017-01-15 MED ORDER — SODIUM CHLORIDE 0.9% FLUSH
10.0000 mL | INTRAVENOUS | Status: DC | PRN
  Administered 2017-01-15: 10 mL via INTRAVENOUS
  Filled 2017-01-15: qty 10

## 2017-01-15 MED ORDER — SODIUM CHLORIDE 0.9 % IV SOLN
Freq: Once | INTRAVENOUS | Status: AC
  Administered 2017-01-15: 10:00:00 via INTRAVENOUS

## 2017-01-15 MED ORDER — HEPARIN SOD (PORK) LOCK FLUSH 100 UNIT/ML IV SOLN
500.0000 [IU] | Freq: Once | INTRAVENOUS | Status: AC | PRN
  Administered 2017-01-15: 500 [IU]
  Filled 2017-01-15: qty 5

## 2017-01-15 MED ORDER — PACLITAXEL CHEMO INJECTION 300 MG/50ML
70.0000 mg/m2 | Freq: Once | INTRAVENOUS | Status: AC
  Administered 2017-01-15: 180 mg via INTRAVENOUS
  Filled 2017-01-15: qty 30

## 2017-01-15 MED ORDER — DIPHENHYDRAMINE HCL 50 MG/ML IJ SOLN
INTRAMUSCULAR | Status: AC
  Filled 2017-01-15: qty 1

## 2017-01-15 MED ORDER — DEXAMETHASONE SODIUM PHOSPHATE 10 MG/ML IJ SOLN
10.0000 mg | Freq: Once | INTRAMUSCULAR | Status: AC
  Administered 2017-01-15: 10 mg via INTRAVENOUS

## 2017-01-15 MED ORDER — FAMOTIDINE IN NACL 20-0.9 MG/50ML-% IV SOLN
INTRAVENOUS | Status: AC
  Filled 2017-01-15: qty 50

## 2017-01-15 MED ORDER — PALONOSETRON HCL INJECTION 0.25 MG/5ML
INTRAVENOUS | Status: AC
  Filled 2017-01-15: qty 5

## 2017-01-15 MED ORDER — SODIUM CHLORIDE 0.9% FLUSH
10.0000 mL | INTRAVENOUS | Status: DC | PRN
  Administered 2017-01-15: 10 mL
  Filled 2017-01-15: qty 10

## 2017-01-15 MED ORDER — DIPHENHYDRAMINE HCL 50 MG/ML IJ SOLN
25.0000 mg | Freq: Once | INTRAMUSCULAR | Status: AC
  Administered 2017-01-15: 25 mg via INTRAVENOUS

## 2017-01-15 MED ORDER — FAMOTIDINE IN NACL 20-0.9 MG/50ML-% IV SOLN
20.0000 mg | Freq: Once | INTRAVENOUS | Status: AC
  Administered 2017-01-15: 20 mg via INTRAVENOUS

## 2017-01-15 NOTE — Patient Instructions (Signed)
[facility] Discharge Instructions for Patients Receiving Chemotherapy  Today you received the following chemotherapy agents taxol/carboplatin  To help prevent nausea and vomiting after your treatment, we encourage you to take your nausea medication as directed   If you develop nausea and vomiting that is not controlled by your nausea medication, call the clinic.   BELOW ARE SYMPTOMS THAT SHOULD BE REPORTED IMMEDIATELY:  *FEVER GREATER THAN 100.5 F  *CHILLS WITH OR WITHOUT FEVER  NAUSEA AND VOMITING THAT IS NOT CONTROLLED WITH YOUR NAUSEA MEDICATION  *UNUSUAL SHORTNESS OF BREATH  *UNUSUAL BRUISING OR BLEEDING  TENDERNESS IN MOUTH AND THROAT WITH OR WITHOUT PRESENCE OF ULCERS  *URINARY PROBLEMS  *BOWEL PROBLEMS  UNUSUAL RASH Items with * indicate a potential emergency and should be followed up as soon as possible.  Feel free to call the clinic you have any questions or concerns. The clinic phone number is [phone].  

## 2017-01-15 NOTE — Telephone Encounter (Signed)
Gave patient AVS and calender per 3/20 los.

## 2017-01-22 ENCOUNTER — Encounter: Payer: Self-pay | Admitting: Radiation Oncology

## 2017-01-22 ENCOUNTER — Ambulatory Visit
Admission: RE | Admit: 2017-01-22 | Discharge: 2017-01-22 | Disposition: A | Discharge status: Home / Self Care | Payer: Medicare Other | Source: Ambulatory Visit | Attending: Radiation Oncology | Admitting: Radiation Oncology

## 2017-01-22 DIAGNOSIS — I252 Old myocardial infarction: Secondary | ICD-10-CM | POA: Diagnosis not present

## 2017-01-22 DIAGNOSIS — I1310 Hypertensive heart and chronic kidney disease without heart failure, with stage 1 through stage 4 chronic kidney disease, or unspecified chronic kidney disease: Secondary | ICD-10-CM | POA: Insufficient documentation

## 2017-01-22 DIAGNOSIS — M1990 Unspecified osteoarthritis, unspecified site: Secondary | ICD-10-CM | POA: Insufficient documentation

## 2017-01-22 DIAGNOSIS — Z87442 Personal history of urinary calculi: Secondary | ICD-10-CM | POA: Diagnosis not present

## 2017-01-22 DIAGNOSIS — I5032 Chronic diastolic (congestive) heart failure: Secondary | ICD-10-CM | POA: Diagnosis not present

## 2017-01-22 DIAGNOSIS — Z08 Encounter for follow-up examination after completed treatment for malignant neoplasm: Secondary | ICD-10-CM | POA: Diagnosis not present

## 2017-01-22 DIAGNOSIS — E1122 Type 2 diabetes mellitus with diabetic chronic kidney disease: Secondary | ICD-10-CM | POA: Diagnosis not present

## 2017-01-22 DIAGNOSIS — K7460 Unspecified cirrhosis of liver: Secondary | ICD-10-CM | POA: Insufficient documentation

## 2017-01-22 DIAGNOSIS — Z87891 Personal history of nicotine dependence: Secondary | ICD-10-CM | POA: Diagnosis not present

## 2017-01-22 DIAGNOSIS — C160 Malignant neoplasm of cardia: Secondary | ICD-10-CM | POA: Insufficient documentation

## 2017-01-22 DIAGNOSIS — E875 Hyperkalemia: Secondary | ICD-10-CM | POA: Diagnosis not present

## 2017-01-22 DIAGNOSIS — E118 Type 2 diabetes mellitus with unspecified complications: Secondary | ICD-10-CM | POA: Diagnosis not present

## 2017-01-22 DIAGNOSIS — E669 Obesity, unspecified: Secondary | ICD-10-CM | POA: Diagnosis not present

## 2017-01-22 DIAGNOSIS — R918 Other nonspecific abnormal finding of lung field: Secondary | ICD-10-CM | POA: Insufficient documentation

## 2017-01-22 DIAGNOSIS — Z794 Long term (current) use of insulin: Secondary | ICD-10-CM | POA: Insufficient documentation

## 2017-01-22 DIAGNOSIS — N182 Chronic kidney disease, stage 2 (mild): Secondary | ICD-10-CM | POA: Diagnosis not present

## 2017-01-22 DIAGNOSIS — N183 Chronic kidney disease, stage 3 (moderate): Secondary | ICD-10-CM | POA: Diagnosis not present

## 2017-01-22 DIAGNOSIS — K5903 Drug induced constipation: Secondary | ICD-10-CM | POA: Diagnosis not present

## 2017-01-22 DIAGNOSIS — I10 Essential (primary) hypertension: Secondary | ICD-10-CM | POA: Diagnosis not present

## 2017-01-22 DIAGNOSIS — J449 Chronic obstructive pulmonary disease, unspecified: Secondary | ICD-10-CM | POA: Insufficient documentation

## 2017-01-22 DIAGNOSIS — I700 Atherosclerosis of aorta: Secondary | ICD-10-CM | POA: Diagnosis not present

## 2017-01-22 DIAGNOSIS — I2510 Atherosclerotic heart disease of native coronary artery without angina pectoris: Secondary | ICD-10-CM | POA: Diagnosis not present

## 2017-01-22 NOTE — Progress Notes (Signed)
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  Interval Since Last Radiation:  7 months  06/04/2016 through 06/22/2016:  The patient was treated to the esophageal tumor using a 5 field 3-D conformal technique on 3-D tomotherapy. The patient received 37.5 gray in 15 fractions.  Narrative:  The patient returns today for routine follow-up. In summary he was diagnosed with stage IV cancer of the cardio-esophageal junction with upper abdominal and mediastinal adenopathy. He was treated with  palliative radiotherapy to the esophagus in August 2017. He has been on systemic therapy with Dr. Burr Medico since with Taxol/Carboplatin. He's had regression of his original tumor and continues in consolidative treatment q 2 weeks. He comes today for follow up evaluation.     On review of systems, the patient reports he's doing great. He's eating well and reports rare episodes of when he has pain swallowing liquids and feels that some air passes at these times. He denies cough, fevers, chills, night sweats, or persistent weight loss. In total he's lost about 55-60 pounds since his original diagnosis, but states his blood sugars have been much more controlled. He has had some constipation around the time of chemotherapy for which he uses prune juice in his diet. This seems to work well. He denies any bladder disturbances, and denies abdominal pain, nausea or vomiting. He denies any new musculoskeletal or joint aches or pains, new skin lesions or concerns. A complete review of systems is obtained and is otherwise negative.   Past Medical History:  Past Medical History:  Diagnosis Date  . Asthma    as a child  . Cataract    multiple types,  bilateral  . Cholelithiasis without cholecystitis   . Chronic diastolic CHF (congestive heart failure) ([place]) 02/25/2009  . Chronic renal insufficiency, stage III (moderate) 2017   Stage II/III (GFR around 60)  . 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 COPD (chronic obstructive pulmonary disease) ([place])   . Coronary artery disease    chronically occluded LAD and diagonal with right to left collaterals, mild disease in the left circ and moderate disease in the mid RCA on medical management with Imdur, ASA, and Plavix.  . Dermatitis 05/2014  . DIABETES MELLITUS, TYPE II 06/24/2007   No diab retpthy as of 08/05/15 eye exam.  . Esophageal cancer ([place]) 04/2016   poorly differentiated carcinoma (Dr. Pyrtle--EGD).  CT C/A/P showed metastatic adenopathy in mediastinum and upper abdomen 05/09/16.  Tx plan is palliative radiation (completed 06/22/16), then palliative systemic chemotherapy (carbo+taxol) was started but as of 08/03/16 onc f/u this was held due to severe knee and ankle arthralgias.  Restarting as of 10/2015 (08/2016 CT showed some disease regression  . Esophageal cancer ([place])    CT 12/2016 showed no residual esoph mass--plan to continue chemo  . Essential hypertension 05/01/2007   Qualifier: Diagnosis of  By: Tiney Rouge CMA, Ellison Hughs     . History of kidney stones   . Hyperkalemia 12/2015   Decreased ACE-I by 50% in response, then potassium normalized.  Marland Kitchen HYPERLIPIDEMIA 06/24/2007  . Hypoxemia 01/27/2010  . Myocardial infarction    pt states he was informed  per MD that he has had one but pt was unaware   . OBESITY 09/02/2008  . Obesity hypoventilation syndrome (HCC)    oxygen 24/7 (2 liters [place] as of 02/2016)  . On home oxygen therapy    Oxygen @ 2l/m nasally 24/7 hours  . OSA (obstructive sleep apnea)    not tested; pt scored 4 per stop bang tool results sent to PCP   . OSTEOARTHRITIS 05/01/2007  . Presbycusis of both ears 05/2015   [facility]    . Pruritic condition 05/2014   Allergist summer 2015, no new testing.  [name] field defect 05/10/16   Per Dr. Melina Fiddler, O.D.: Rt, loss of inf/temp quad and some loss sup/temp.  ?CVA  ? Pituitary tumor? ?Brain met.    Past Surgical History: Past Surgical History:  Procedure Laterality Date  . APPENDECTOMY    . BALLOON DILATION N/A 05/04/2016   Procedure: BALLOON DILATION;  Surgeon: Jerene Bears, MD;  Location: [facility];  Service: Gastroenterology;  Laterality: N/A;  . CARDIAC CATHETERIZATION    . CARPAL TUNNEL RELEASE Left   . CATARACT EXTRACTION W/PHACO Right 04/29/2013   Procedure: CATARACT EXTRACTION PHACO AND INTRAOCULAR LENS PLACEMENT (IOC);  Surgeon: Adonis Brook, MD;  Location: [facility];  Service: Ophthalmology;  Laterality: Right;  . CATARACT EXTRACTION, BILATERAL    . COLONOSCOPY W/ POLYPECTOMY  08/2011   Many polyps--all hyperplastic, severe diverticulosis, int hem.  [employer] hemoccult testing via [facility] 06/22/15 NEG  . ESOPHAGOGASTRODUODENOSCOPY N/A 05/04/2016   Procedure: ESOPHAGOGASTRODUODENOSCOPY (EGD);  Surgeon: Jerene Bears, MD;  Location: [facility];  Service: Gastroenterology;  Laterality: N/A;  . IR GENERIC HISTORICAL  10/03/2016   IR US GUIDE VASC ACCESS RIGHT 10/03/2016 Greggory Keen, MD [facility]  . IR GENERIC HISTORICAL  10/03/2016   IR FLUORO GUIDE PORT INSERTION RIGHT 10/03/2016 Greggory Keen, MD [facility]  . KNEE ARTHROSCOPY WITH MEDIAL MENISECTOMY Right 12/16/2014   Procedure: RIGHT KNEE ARTHROSCOPY WITH MEDIAL MENISECTOMY microfracture medial femoral condyle abrasion condroplasty medial femoral condyle lateral menisectomy;  Surgeon: Tobi Bastos, MD;  Location: [facility];  Service: Orthopedics;  Laterality: Right;  . LEFT HEART CATHETERIZATION WITH CORONARY ANGIOGRAM N/A 10/26/2013   Procedure: LEFT HEART CATHETERIZATION WITH CORONARY ANGIOGRAM;  Surgeon: Jettie Booze, MD;  Location: [facility] LAB;  Service: Cardiovascular;  Laterality: N/A;  . LUMBAR  [place] [facility]  2001  . SHOULDER SURGERY Right    right fx  . TONSILLECTOMY    . TRANSTHORACIC ECHOCARDIOGRAM  03/2016   EF 55-60%, grade I DD, LAE  . WRIST SURGERY Right    right fx    Social History:  Social History   Social History  . Marital status: Married    Spouse name: susan  . Number of children: 2  . Years of education: N/A   Occupational History  . Retired    Social History Main Topics  . Smoking status: Former Smoker    Packs/day: 1.50    Years: 30.00    Types: Cigarettes, Pipe, Cigars    Quit date: 10/30/1983  . Smokeless tobacco: Former Systems developer    Types: Chew    Quit date: 05/15/2016  . Alcohol use No  . Drug use: No  . Sexual activity: Not Currently   Other Topics Concern  . Not on file   Social History Narrative   Married, one son and one daughter.   His daughter and her two children live with him.   Coffee daily.  Former  smoker.  No alcohol.   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   Attends church weekly-   Oxygen continuous       Family History: Family History  Problem Relation Age of Onset  . Heart attack Mother   . Aneurysm Father   . Alcohol abuse Father   . Diabetes Maternal Aunt   . Colon cancer Neg Hx      ALLERGIES:  has No Known Allergies.  Meds: Current Outpatient Prescriptions  Medication Sig Dispense Refill  . aspirin 81 MG tablet Take 81 mg by mouth daily.      Marland Kitchen atorvastatin (LIPITOR) 80 MG tablet TAKE ONE TABLET BY MOUTH ONCE DAILY 90 tablet 2  . beta carotene w/minerals (OCUVITE) tablet Take 1 tablet by mouth daily.    . cetirizine (ZYRTEC) 10 MG tablet Take 10 mg by mouth daily.    . citalopram (CELEXA) 20 MG tablet TAKE ONE TABLET BY MOUTH ONCE DAILY 90 tablet 1  . clonazePAM (KLONOPIN) 1 MG tablet TAKE ONE TABLET BY MOUTH TWICE DAILY AS NEEDED FOR ANXIETY 60 tablet 5  . cromolyn (OPTICROM) 4 % ophthalmic solution Place 1 drop into both eyes 4 (four) times daily.    Marland Kitchen gabapentin (NEURONTIN)  300 MG capsule TAKE ONE CAPSULE BY MOUTH THREE TIMES DAILY 270 capsule 3  . hydrOXYzine (ATARAX/VISTARIL) 25 MG tablet TAKE 1 TABLET BY MOUTH AT SUPPER AND 1 TABLET BY MOUTH AT BEDTIME FOR INSOMNIA 60 tablet 6  . insulin lispro (HUMALOG KWIKPEN) 100 UNIT/ML KiwkPen Inject into the skin. SS at meal time    . Insulin NPH, Human,, Isophane, (HUMULIN N) 100 UNIT/ML Kiwkpen Inject into the skin. 7 units every morning and 7 units every evening    . magnesium chloride (SLOW-MAG) 64 MG TBEC SR tablet Take 1 tablet (64 mg total) by mouth daily. 60 tablet 0  . metFORMIN (GLUCOPHAGE) 1000 MG tablet TAKE ONE TABLET BY MOUTH TWICE DAILY WITH MEALS 180 tablet 1  . Omega-3 Fatty Acids ([employer] FISH OIL) 1000 MG CAPS Take 2 tablets by mouth 2 (two) times daily. 90 capsule   . ondansetron (ZOFRAN) 8 MG tablet Take 1 tablet (8 mg total) by mouth 2 (two) times daily as needed for refractory nausea / vomiting. Start on day 3 after chemo. 30 tablet 1  . ONE TOUCH ULTRA TEST test strip     . ONETOUCH DELICA LANCETS 08M MISC     . oxyCODONE (OXY IR/ROXICODONE) 5 MG immediate release tablet Take 1 tablet (5 mg total) by mouth every 6 (six) hours as needed for severe pain. 60 tablet 0  . OXYGEN Inhale 2 L/min into the lungs continuous.    . pantoprazole (PROTONIX) 40 MG tablet TAKE ONE TABLET BY MOUTH TWICE DAILY 60 tablet 3  . prochlorperazine (COMPAZINE) 10 MG tablet Take 1 tablet (10 mg total) by mouth every 6 (six) hours as needed (Nausea or vomiting). 30 tablet 1  . ZETIA 10 MG tablet TAKE ONE TABLET BY MOUTH ONCE DAILY 90 tablet 1   No current facility-administered medications for this encounter.     Physical Findings:  height is _0  (1.753 m) and weight is 272 lb 3.2 oz (123.5 kg). His oral temperature is 98.3 F (36.8 C). His blood pressure is 134/77 and his pulse is 113 (abnormal). His respiration is 20 and oxygen saturation is 100%.   Pain Assessment Pain Score: 0-No pain/10  In general this is a well  appearing Caucasian male in no acute distress. He is alert and oriented x4 and appropriate throughout the examination. HEENT reveals that the patient is normocephalic, atraumatic. EOMs are intact. PERRLA. Skin is intact without any evidence of gross lesions. Cardiovascular exam reveals a regular rate and rhythm, no clicks rubs or murmurs are auscultated. Chest is clear to auscultation bilaterally. Lymphatic assessment is performed and does not reveal any adenopathy in the cervical, supraclavicular, axillary, or inguinal chains. Abdomen has active bowel sounds in all quadrants and is intact. The abdomen is soft, non tender, non distended. Lower extremities are negative for pretibial pitting edema, deep calf tenderness, cyanosis or clubbing.   Lab Findings: Lab Results  Component Value Date   WBC 2.7 (L) 01/15/2017   HGB 9.8 (L) 01/15/2017   HCT 30.2 (L) 01/15/2017   MCV 90.0 01/15/2017   PLT 109 (L) 01/15/2017     Radiographic Findings: Ct Chest W Contrast  Result Date: 01/07/2017 CLINICAL DATA:  Esophageal cancer, ongoing chemotherapy. EXAM: CT CHEST, ABDOMEN, AND PELVIS WITH CONTRAST TECHNIQUE: Multidetector CT imaging of the chest, abdomen and pelvis was performed following the standard protocol during bolus administration of intravenous contrast. CONTRAST:  168m ISOVUE-300 IOPAMIDOL (ISOVUE-300) INJECTION 61% COMPARISON:  09/26/2016. FINDINGS: CT CHEST FINDINGS Cardiovascular: Right IJ Port-A-Cath terminates in the SVC. Atherosclerotic calcification of the arterial vasculature, including three-vessel involvement of the coronary arteries. Heart size within normal limits. No pericardial effusion. Mediastinum/Nodes: High right paratracheal lymph nodes measure up to 10 mm, previously 7 mm. No hilar, axillary or periesophageal adenopathy. There may be mild residual esophageal wall thickening. No discrete mass. Lungs/Pleura: Image quality is degraded by respiratory motion. Mild dependent volume loss  with scattered subpleural reticular densities, similar. Patchy nodularity in the left lower lobe (series 4, image 78), new. Trace left pleural fluid. Airway is grossly unremarkable. Musculoskeletal: No worrisome lytic or sclerotic lesions. Advanced degenerative changes in the right shoulder. CT ABDOMEN PELVIS FINDINGS Hepatobiliary: Liver margin is irregular. Subcentimeter low-attenuation lesion in the dome of the liver is too small to characterize but stable. Small stones layer in the gallbladder. No biliary ductal dilatation. Pancreas: Negative. Spleen: Negative. Adrenals/Urinary Tract: Adrenal glands are unremarkable. Low-attenuation lesions in the kidneys measure up to 2.3 cm on the right and are likely cysts although definitive characterization is limited without post-contrast imaging. Ureters are decompressed. Bladder is low in volume. Stomach/Bowel: Stomach, small bowel and colon are unremarkable. Appendix is not readily visualized. Vascular/Lymphatic: Atherosclerotic calcification of the arterial vasculature without abdominal aortic aneurysm. No pathologically enlarged lymph nodes. Reproductive: Prostate is visualized. Other: No free fluid.  Mesenteries and peritoneum are unremarkable. Musculoskeletal: No worrisome lytic or sclerotic lesions. Osseous bridging along the anterior sacroiliac joints. Degenerative changes in the spine. IMPRESSION: 1. No residual esophageal mass or adenopathy. 2. New nodular airspace opacification in the left lower lobe. Continued attention on followup exams is warranted as metastatic disease cannot be definitively excluded. 3. Cirrhosis. 4. Trace left pleural effusion. 5. Aortic atherosclerosis (ICD10-170.0). Coronary artery calcification. 6. Cholelithiasis. 7. Mild basilar predominant subpleural reticular densities. Difficult to exclude interstitial lung disease. Electronically Signed   By: MLorin PicketM.D.   On: 01/07/2017 09:46   Ct Abdomen Pelvis W Contrast  Result  Date: 01/07/2017 CLINICAL DATA:  Esophageal cancer, ongoing chemotherapy. EXAM: CT CHEST, ABDOMEN, AND PELVIS WITH CONTRAST TECHNIQUE: Multidetector CT imaging of the chest, abdomen and pelvis was performed following the standard protocol during bolus administration of intravenous contrast. CONTRAST:  1053mISOVUE-300 IOPAMIDOL (ISOVUE-300) INJECTION  61% COMPARISON:  09/26/2016. FINDINGS: CT CHEST FINDINGS Cardiovascular: Right IJ Port-A-Cath terminates in the SVC. Atherosclerotic calcification of the arterial vasculature, including three-vessel involvement of the coronary arteries. Heart size within normal limits. No pericardial effusion. Mediastinum/Nodes: High right paratracheal lymph nodes measure up to 10 mm, previously 7 mm. No hilar, axillary or periesophageal adenopathy. There may be mild residual esophageal wall thickening. No discrete mass. Lungs/Pleura: Image quality is degraded by respiratory motion. Mild dependent volume loss with scattered subpleural reticular densities, similar. Patchy nodularity in the left lower lobe (series 4, image 78), new. Trace left pleural fluid. Airway is grossly unremarkable. Musculoskeletal: No worrisome lytic or sclerotic lesions. Advanced degenerative changes in the right shoulder. CT ABDOMEN PELVIS FINDINGS Hepatobiliary: Liver margin is irregular. Subcentimeter low-attenuation lesion in the dome of the liver is too small to characterize but stable. Small stones layer in the gallbladder. No biliary ductal dilatation. Pancreas: Negative. Spleen: Negative. Adrenals/Urinary Tract: Adrenal glands are unremarkable. Low-attenuation lesions in the kidneys measure up to 2.3 cm on the right and are likely cysts although definitive characterization is limited without post-contrast imaging. Ureters are decompressed. Bladder is low in volume. Stomach/Bowel: Stomach, small bowel and colon are unremarkable. Appendix is not readily visualized. Vascular/Lymphatic: Atherosclerotic  calcification of the arterial vasculature without abdominal aortic aneurysm. No pathologically enlarged lymph nodes. Reproductive: Prostate is visualized. Other: No free fluid.  Mesenteries and peritoneum are unremarkable. Musculoskeletal: No worrisome lytic or sclerotic lesions. Osseous bridging along the anterior sacroiliac joints. Degenerative changes in the spine. IMPRESSION: 1. No residual esophageal mass or adenopathy. 2. New nodular airspace opacification in the left lower lobe. Continued attention on followup exams is warranted as metastatic disease cannot be definitively excluded. 3. Cirrhosis. 4. Trace left pleural effusion. 5. Aortic atherosclerosis (ICD10-170.0). Coronary artery calcification. 6. Cholelithiasis. 7. Mild basilar predominant subpleural reticular densities. Difficult to exclude interstitial lung disease. Electronically Signed   By: Lorin Picket M.D.   On: 01/07/2017 09:46    Impression/Plan: 1. 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 The patient appears to be clinically doing well and is stable regarding his disease. He will continue consolidative chemotherapy, follow-up with Dr. Burr Medico. Like to see him back in approximately 6 months time for continued evaluation, and he will call sooner if he has questions or concerns regarding his previous radiation. 2. Chemotherapy induced constipation. The patient appears to be doing well overall. He is not experiencing any significant trouble with constipation provided uses prune juice [place] yogurt. He will continue this but inform us or Dr. Burr Medico at any trouble he continues to have regarding this issue.    Carola Rhine, PAC

## 2017-01-23 ENCOUNTER — Ambulatory Visit (INDEPENDENT_AMBULATORY_CARE_PROVIDER_SITE_OTHER): Payer: Medicare Other | Admitting: Family Medicine

## 2017-01-23 ENCOUNTER — Encounter: Payer: Self-pay | Admitting: Family Medicine

## 2017-01-23 VITALS — BP 129/80 | HR 103 | Temp 97.9°F | Resp 16 | Ht 69.0 in | Wt 272.5 lb

## 2017-01-23 DIAGNOSIS — Z794 Long term (current) use of insulin: Secondary | ICD-10-CM

## 2017-01-23 DIAGNOSIS — E118 Type 2 diabetes mellitus with unspecified complications: Secondary | ICD-10-CM | POA: Diagnosis not present

## 2017-01-23 DIAGNOSIS — E662 Morbid (severe) obesity with alveolar hypoventilation: Secondary | ICD-10-CM

## 2017-01-23 DIAGNOSIS — I10 Essential (primary) hypertension: Secondary | ICD-10-CM

## 2017-01-23 DIAGNOSIS — C160 Malignant neoplasm of cardia: Secondary | ICD-10-CM

## 2017-01-23 DIAGNOSIS — J438 Other emphysema: Secondary | ICD-10-CM

## 2017-01-23 LAB — MICROALBUMIN / CREATININE URINE RATIO
Creatinine,U: 88.8 mg/dL
MICROALB UR: 2.9 mg/dL — AB (ref 0.0–1.9)
Microalb Creat Ratio: 3.3 mg/g (ref 0.0–30.0)

## 2017-01-23 LAB — POCT GLYCOSYLATED HEMOGLOBIN (HGB A1C): HEMOGLOBIN A1C: 5.5

## 2017-01-23 NOTE — Progress Notes (Signed)
OFFICE VISIT  01/23/2017   CC:  Chief Complaint  Patient presents with  . Follow-up    RCI, pt is not fasting.    HPI:    Patient is a 71 y.o. Caucasian male who presents for f/u DM 2, HTN, COPD and obesity hypoventilation syndrome--oxygen dependent. Has esophageal cancer that he is getting ongoing chemotherapy for.  Recent CT showed no residual mass or adenopathy.  Since being on chemo, his bp's have been normal/low normal so we got him off his bp meds. Not monitorin bp at home any.  DM 2: humalog U 15 U qAC, NPH 15 U bid.  Glucoses have been averaging about 160. No hypoglycemia. FEET: no tingling, numbness, or pain/burning in feet.  COPD/Obesity hypoventilation syndrome: says his breathing is stable.  Wearing 2L oxygen 24/7.  ROS: no chest pain.  No abd pain.  Appetite ok. No LE swelling.  Past Medical History:  Diagnosis Date  . Asthma    as a child  . Cataract    multiple types, bilateral  . Cholelithiasis without cholecystitis   . Chronic diastolic CHF (congestive heart failure) ([place]) 02/25/2009  . Chronic renal insufficiency, stage III (moderate) 2017   Stage II/III (GFR around 60)  . 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 COPD (chronic obstructive pulmonary disease) ([place])   . Coronary artery disease    chronically occluded LAD and diagonal with right to left collaterals, mild disease in the left circ and moderate disease in the mid RCA on medical management with Imdur, ASA, and Plavix.  . Dermatitis 05/2014  . DIABETES MELLITUS, TYPE II 06/24/2007   No diab retpthy as of 08/05/15 eye exam.  . Esophageal cancer ([place]) 04/2016   poorly differentiated carcinoma (Dr. Pyrtle--EGD).  CT C/A/P showed metastatic adenopathy in mediastinum and upper abdomen 05/09/16.  Tx plan is palliative radiation (completed 06/22/16), then palliative systemic chemotherapy (carbo+taxol) was started but as of 08/03/16 onc f/u this was  held due to severe knee and ankle arthralgias.  Restarting as of 10/2015 (08/2016 CT showed some disease regression  . Esophageal cancer ([place])    CT 12/2016 showed no residual esoph mass--plan to continue chemo  . Essential hypertension 05/01/2007   Qualifier: Diagnosis of  By: Tiney Rouge CMA, Ellison Hughs     . History of kidney stones   . Hyperkalemia 12/2015   Decreased ACE-I by 50% in response, then potassium normalized.  Marland Kitchen HYPERLIPIDEMIA 06/24/2007  . Hypoxemia 01/27/2010  . Myocardial infarction    pt states he was informed per MD that he has had one but pt was unaware   . OBESITY 09/02/2008  . Obesity hypoventilation syndrome ([facility])    oxygen 24/7 (2 liters [place] as of 02/2016)  . On home oxygen therapy    Oxygen @ 2l/m nasally 24/7 hours  . OSA (obstructive sleep apnea)    not tested; pt scored 4 per stop bang tool results sent to PCP   . OSTEOARTHRITIS 05/01/2007  . Presbycusis of both ears 05/2015   [facility]  . Pruritic condition 05/2014   Allergist summer 2015, no new testing.  [name] field defect 05/10/16   Per Dr. Melina Fiddler, O.D.: Rt, loss of inf/temp quad and some loss sup/temp.  ?CVA  ? Pituitary tumor? ?Brain met.    Past Surgical History:  Procedure Laterality Date  . APPENDECTOMY    . BALLOON DILATION N/A 05/04/2016   Procedure: BALLOON  DILATION;  Surgeon: Jerene Bears, MD;  Location: [facility];  Service: Gastroenterology;  Laterality: N/A;  . CARDIAC CATHETERIZATION    . CARPAL TUNNEL RELEASE Left   . CATARACT EXTRACTION W/PHACO Right 04/29/2013   Procedure: CATARACT EXTRACTION PHACO AND INTRAOCULAR LENS PLACEMENT (IOC);  Surgeon: Adonis Brook, MD;  Location: [facility];  Service: Ophthalmology;  Laterality: Right;  . CATARACT EXTRACTION, BILATERAL    . COLONOSCOPY W/ POLYPECTOMY  08/2011   Many polyps--all hyperplastic, severe diverticulosis, int hem.  [employer] hemoccult testing via [facility] 06/22/15 NEG  . ESOPHAGOGASTRODUODENOSCOPY N/A 05/04/2016   Procedure: ESOPHAGOGASTRODUODENOSCOPY (EGD);   Surgeon: Jerene Bears, MD;  Location: [facility];  Service: Gastroenterology;  Laterality: N/A;  . IR GENERIC HISTORICAL  10/03/2016   IR US GUIDE VASC ACCESS RIGHT 10/03/2016 Greggory Keen, MD [facility]  . IR GENERIC HISTORICAL  10/03/2016   IR FLUORO GUIDE PORT INSERTION RIGHT 10/03/2016 Greggory Keen, MD [facility]  . KNEE ARTHROSCOPY WITH MEDIAL MENISECTOMY Right 12/16/2014   Procedure: RIGHT KNEE ARTHROSCOPY WITH MEDIAL MENISECTOMY microfracture medial femoral condyle abrasion condroplasty medial femoral condyle lateral menisectomy;  Surgeon: Tobi Bastos, MD;  Location: [facility];  Service: Orthopedics;  Laterality: Right;  . LEFT HEART CATHETERIZATION WITH CORONARY ANGIOGRAM N/A 10/26/2013   Procedure: LEFT HEART CATHETERIZATION WITH CORONARY ANGIOGRAM;  Surgeon: Jettie Booze, MD;  Location: [facility];  Service: Cardiovascular;  Laterality: N/A;  . LUMBAR [place] SURGERY  2001  . SHOULDER SURGERY Right    right fx  . TONSILLECTOMY    . TRANSTHORACIC ECHOCARDIOGRAM  03/2016   EF 55-60%, grade I DD, LAE  . WRIST SURGERY Right    right fx    Outpatient Medications Prior to Visit  Medication Sig Dispense Refill  . aspirin 81 MG tablet Take 81 mg by mouth daily.      Marland Kitchen atorvastatin (LIPITOR) 80 MG tablet TAKE ONE TABLET BY MOUTH ONCE DAILY 90 tablet 2  . beta carotene w/minerals (OCUVITE) tablet Take 1 tablet by mouth daily.    . cetirizine (ZYRTEC) 10 MG tablet Take 10 mg by mouth daily.    . citalopram (CELEXA) 20 MG tablet TAKE ONE TABLET BY MOUTH ONCE DAILY 90 tablet 1  . clonazePAM (KLONOPIN) 1 MG tablet TAKE ONE TABLET BY MOUTH TWICE DAILY AS NEEDED FOR ANXIETY 60 tablet 5  . cromolyn (OPTICROM) 4 % ophthalmic solution Place 1 drop into both eyes 4 (four) times daily.    Marland Kitchen gabapentin (NEURONTIN) 300 MG capsule TAKE ONE CAPSULE BY MOUTH THREE TIMES DAILY 270 capsule 3  . hydrOXYzine (ATARAX/VISTARIL) 25 MG tablet TAKE 1 TABLET BY MOUTH AT SUPPER AND 1 TABLET BY MOUTH AT  BEDTIME FOR INSOMNIA 60 tablet 6  . insulin lispro (HUMALOG KWIKPEN) 100 UNIT/ML KiwkPen Inject into the skin. SS at meal time    . Insulin NPH, Human,, Isophane, (HUMULIN N) 100 UNIT/ML Kiwkpen Inject into the skin. 7 units every morning and 7 units every evening    . magnesium chloride (SLOW-MAG) 64 MG TBEC SR tablet Take 1 tablet (64 mg total) by mouth daily. 60 tablet 0  . metFORMIN (GLUCOPHAGE) 1000 MG tablet TAKE ONE TABLET BY MOUTH TWICE DAILY WITH MEALS 180 tablet 1  . Omega-3 Fatty Acids ([employer] FISH OIL) 1000 MG CAPS Take 2 tablets by mouth 2 (two) times daily. 90 capsule   . ondansetron (ZOFRAN) 8 MG tablet Take 1 tablet (8 mg total) by mouth 2 (two) times daily as needed  for refractory nausea / vomiting. Start on day 3 after chemo. 30 tablet 1  . ONE TOUCH ULTRA TEST test strip     . ONETOUCH DELICA LANCETS 40J MISC     . oxyCODONE (OXY IR/ROXICODONE) 5 MG immediate release tablet Take 1 tablet (5 mg total) by mouth every 6 (six) hours as needed for severe pain. 60 tablet 0  . OXYGEN Inhale 2 L/min into the lungs continuous.    . pantoprazole (PROTONIX) 40 MG tablet TAKE ONE TABLET BY MOUTH TWICE DAILY 60 tablet 3  . prochlorperazine (COMPAZINE) 10 MG tablet Take 1 tablet (10 mg total) by mouth every 6 (six) hours as needed (Nausea or vomiting). 30 tablet 1  . ZETIA 10 MG tablet TAKE ONE TABLET BY MOUTH ONCE DAILY 90 tablet 1   No facility-administered medications prior to visit.     No Known Allergies  ROS As per HPI  PE: Blood pressure 129/80, pulse (!) 103, temperature 97.9 F (36.6 C), temperature source Oral, resp. rate 16, height _0  (1.753 m), weight 272 lb 8 oz (123.6 kg), SpO2 96 %. Gen: Alert, well appearing.  Patient is oriented to person, place, time, and situation. AFFECT: pleasant, lucid thought and speech. CV: RRR, S1 and S2 distant.  No murmur appreciated.  No rub or gallop. LUNGS: CTA bilat, nonlabored. EXT: no clubbing, cyanosis, or edema.  Foot exam - both  normal; no swelling, tenderness or skin or vascular lesions. Color and temperature is normal. Sensation is intact to monofilament testing except on plantar surface of R great toe. Peripheral pulses are palpable. Toenails are normal.   LABS:  Lab Results  Component Value Date   TSH 2.940 08/28/2016   Lab Results  Component Value Date   WBC 2.7 (L) 01/15/2017   HGB 9.8 (L) 01/15/2017   HCT 30.2 (L) 01/15/2017   MCV 90.0 01/15/2017   PLT 109 (L) 01/15/2017   Lab Results  Component Value Date   CREATININE 0.8 01/15/2017   BUN 10.7 01/15/2017   NA 140 01/15/2017   K 4.1 01/15/2017   CL 97 11/28/2016   CO2 26 01/15/2017   Lab Results  Component Value Date   ALT 42 01/15/2017   AST 42 (H) 01/15/2017   ALKPHOS 70 01/15/2017   BILITOT 0.75 01/15/2017   Lab Results  Component Value Date   CHOL 96 (L) 11/28/2016   Lab Results  Component Value Date   HDL 37 (L) 11/28/2016   Lab Results  Component Value Date   LDLCALC 25 11/28/2016   Lab Results  Component Value Date   TRIG 168 (H) 11/28/2016   Lab Results  Component Value Date   CHOLHDL 2.6 11/28/2016   Lab Results  Component Value Date   HGBA1C 5.5 01/23/2017    POC a1c 5.5 % today.  IMPRESSION AND PLAN:  1) DM 2; good control as per home monitoring. HbA1c today: 5.5%.  Doing great.  We'll continue current insulin regimen, esp since he has no probs with hypoglycemia. Urine microalbumin/cr specimen obtained today. Feet exam normal today except mild decreased sensation on plantar surface of R great toe.  Discussed this with patient.  2) HTN; doing well still on no meds since he was having low normal/low bp since getting on chemo.  3) COPD/Obesity hypoventilation syndrome: stable.  Continue oxygen 2L round the clock.  He is on no inhalers at this time.  4) Esophageal cancer: recent surveillance imaging showed no residual mass or adenopathy, but  showed new nodular airspace opacification in LLL.  Continued  attention on follow up exams is warranted as metastatic disease cannot be excluded.  Mild cirrhosis noted as well as gallstones w/out other biliary abnormality. Continue f/u with oncology, continuing chemo at this time.  Recent f/u CBC, CMET stable.  An After Visit Summary was printed and given to the patient.  FOLLOW UP: Return in about 3 months (around 04/25/2017) for annual CPE (fasting).  Signed:  Crissie Sickles, MD           01/23/2017

## 2017-01-23 NOTE — Progress Notes (Signed)
Pre visit review using our clinic review tool, if applicable. No additional management support is needed unless otherwise documented below in the visit note. 

## 2017-01-29 ENCOUNTER — Other Ambulatory Visit (HOSPITAL_BASED_OUTPATIENT_CLINIC_OR_DEPARTMENT_OTHER): Payer: Medicare Other

## 2017-01-29 ENCOUNTER — Ambulatory Visit: Payer: Medicare Other

## 2017-01-29 ENCOUNTER — Ambulatory Visit (HOSPITAL_BASED_OUTPATIENT_CLINIC_OR_DEPARTMENT_OTHER): Payer: Medicare Other

## 2017-01-29 VITALS — BP 134/76 | HR 87 | Temp 97.8°F | Resp 16

## 2017-01-29 DIAGNOSIS — Z452 Encounter for adjustment and management of vascular access device: Secondary | ICD-10-CM | POA: Diagnosis not present

## 2017-01-29 DIAGNOSIS — C160 Malignant neoplasm of cardia: Secondary | ICD-10-CM | POA: Diagnosis not present

## 2017-01-29 DIAGNOSIS — C155 Malignant neoplasm of lower third of esophagus: Secondary | ICD-10-CM

## 2017-01-29 DIAGNOSIS — Z95828 Presence of other vascular implants and grafts: Secondary | ICD-10-CM

## 2017-01-29 LAB — COMPREHENSIVE METABOLIC PANEL
ALK PHOS: 72 U/L (ref 40–150)
ALT: 43 U/L (ref 0–55)
ANION GAP: 9 meq/L (ref 3–11)
AST: 43 U/L — ABNORMAL HIGH (ref 5–34)
Albumin: 3.2 g/dL — ABNORMAL LOW (ref 3.5–5.0)
BUN: 8.5 mg/dL (ref 7.0–26.0)
CALCIUM: 8.9 mg/dL (ref 8.4–10.4)
CHLORIDE: 104 meq/L (ref 98–109)
CO2: 26 mEq/L (ref 22–29)
Creatinine: 0.8 mg/dL (ref 0.7–1.3)
EGFR: 89 mL/min/{1.73_m2} — AB (ref >90)
Glucose: 163 mg/dl — ABNORMAL HIGH (ref 70–140)
POTASSIUM: 4.1 meq/L (ref 3.5–5.1)
Sodium: 139 mEq/L (ref 136–145)
Total Bilirubin: 0.71 mg/dL (ref 0.20–1.20)
Total Protein: 6.2 g/dL — ABNORMAL LOW (ref 6.4–8.3)

## 2017-01-29 LAB — CBC WITH DIFFERENTIAL/PLATELET
BASO%: 0.8 % (ref 0.0–2.0)
BASOS ABS: 0 10*3/uL (ref 0.0–0.1)
EOS ABS: 0 10*3/uL (ref 0.0–0.5)
EOS%: 2.6 % (ref 0.0–7.0)
HEMATOCRIT: 30.6 % — AB (ref 38.4–49.9)
HGB: 9.9 g/dL — ABNORMAL LOW (ref 13.0–17.1)
LYMPH#: 0.5 10*3/uL — AB (ref 0.9–3.3)
LYMPH%: 25.8 % (ref 14.0–49.0)
MCH: 29.5 pg (ref 27.2–33.4)
MCHC: 32.4 g/dL (ref 32.0–36.0)
MCV: 91 fL (ref 79.3–98.0)
MONO#: 0.4 10*3/uL (ref 0.1–0.9)
MONO%: 20.7 % — ABNORMAL HIGH (ref 0.0–14.0)
NEUT#: 0.9 10*3/uL — ABNORMAL LOW (ref 1.5–6.5)
NEUT%: 50.1 % (ref 39.0–75.0)
Platelets: 116 10*3/uL — ABNORMAL LOW (ref 140–400)
RBC: 3.36 10*6/uL — ABNORMAL LOW (ref 4.20–5.82)
RDW: 20.6 % — ABNORMAL HIGH (ref 11.0–14.6)
WBC: 1.8 10*3/uL — ABNORMAL LOW (ref 4.0–10.3)

## 2017-01-29 MED ORDER — HEPARIN SOD (PORK) LOCK FLUSH 100 UNIT/ML IV SOLN
500.0000 [IU] | INTRAVENOUS | Status: AC | PRN
  Administered 2017-01-29: 500 [IU]
  Filled 2017-01-29: qty 5

## 2017-01-29 MED ORDER — SODIUM CHLORIDE 0.9% FLUSH
10.0000 mL | INTRAVENOUS | Status: DC | PRN
  Administered 2017-01-29: 10 mL via INTRAVENOUS
  Filled 2017-01-29: qty 10

## 2017-01-29 MED ORDER — SODIUM CHLORIDE 0.9% FLUSH
10.0000 mL | INTRAVENOUS | Status: AC | PRN
  Administered 2017-01-29: 10 mL
  Filled 2017-01-29: qty 10

## 2017-01-29 NOTE — Patient Instructions (Signed)
[facility] Discharge Instructions for Patients Receiving Chemotherapy  Today you received the following chemotherapy agents:   TREATMENT  HELD TODAY.  PLEASE CALL THE CLINIC IF YOU DEVELOP ANY OF THE BELOW LISTED SYMPTOMS  To help prevent nausea and vomiting after your treatment, we encourage you to take your nausea medication AS PRESCRIBED.   If you develop nausea and vomiting that is not controlled by your nausea medication, call the clinic.   BELOW ARE SYMPTOMS THAT SHOULD BE REPORTED IMMEDIATELY:  *FEVER GREATER THAN 100.5 F  *CHILLS WITH OR WITHOUT FEVER  NAUSEA AND VOMITING THAT IS NOT CONTROLLED WITH YOUR NAUSEA MEDICATION  *UNUSUAL SHORTNESS OF BREATH  *UNUSUAL BRUISING OR BLEEDING  TENDERNESS IN MOUTH AND THROAT WITH OR WITHOUT PRESENCE OF ULCERS  *URINARY PROBLEMS  *BOWEL PROBLEMS  UNUSUAL RASH Items with * indicate a potential emergency and should be followed up as soon as possible.  Feel free to call the clinic you have any questions or concerns. The clinic phone number is [phone].  Please show the [facility] at check-in to the Emergency Department and triage nurse.

## 2017-01-29 NOTE — Progress Notes (Signed)
Hold treatment today due to low WBC/ANC per Dr Benay Spice & r/s in 1 week.  Order repeated & verified.  Message to scheduler to r/s.

## 2017-01-30 ENCOUNTER — Telehealth: Payer: Self-pay | Admitting: Hematology

## 2017-01-30 NOTE — Telephone Encounter (Signed)
Spoke with patient re 4/11 appointments. Left appointments for 4/18 as scheduled for now. If patient is able to proceed with treatment 4/11 appointments on 4/18 will be adjusted - patient aware.

## 2017-02-05 ENCOUNTER — Telehealth: Payer: Self-pay | Admitting: Family Medicine

## 2017-02-05 ENCOUNTER — Other Ambulatory Visit: Payer: Self-pay | Admitting: *Deleted

## 2017-02-05 DIAGNOSIS — J449 Chronic obstructive pulmonary disease, unspecified: Secondary | ICD-10-CM | POA: Diagnosis not present

## 2017-02-05 MED ORDER — INSULIN ISOPHANE HUMAN 100 UNIT/ML KWIKPEN: PEN_INJECTOR | SUBCUTANEOUS | 3 refills | Status: DC

## 2017-02-05 NOTE — Telephone Encounter (Signed)
[facility].  RF request for humulin N LOV: 01/23/17 Next ov: 04/25/17 Last written: 07/10/16 360 w/ 6Rf

## 2017-02-05 NOTE — Telephone Encounter (Signed)
Patient states he picked up rx for Insulin NPH, Human,, Isophane, (HUMULIN N) 100 UNIT/ML Kiwkpen which instruct him to take 7 units every morning and 7 units every evening.    Patient states this dosage will not work for him as his blood sugar levels have been elevated.  He states he was previously taking 90 units but had been decreased to 20-25 units and noticed that he has lost a significant amount of weight.  He thinks his insulin needs to be increased.

## 2017-02-05 NOTE — Progress Notes (Signed)
[facility]  [phone] [phone]  Clinic Follow up Note   Patient Care Team: Tammi Sou, MD as PCP - General (Family Medicine) Ulyses Southward, MD as Consulting Physician (Allergy and Immunology) Calton Dach, MD as Consulting Physician (Optometry) Sueanne Margarita, MD as Consulting Physician (Cardiology) Jerene Bears, MD as Consulting Physician (Gastroenterology) Latanya Maudlin, MD as Consulting Physician (Orthopedic Surgery) Jodi Marble, MD as Consulting Physician (Otolaryngology) Truitt Merle, MD as Consulting Physician (Hematology) Kyung Rudd, MD as Consulting Physician (Radiation Oncology) 02/06/2017    CHIEF COMPLAINTS:  Follow up GE junction cancer  Oncology History   Cancer of cardio-esophageal junction [facility])   Staging form: Stomach, AJCC 7th Edition     Clinical stage from 05/04/2016: Stage IV (TX, N2, M1) - Signed by Truitt Merle, MD on 05/18/2016        Cancer of cardio-esophageal junction ([place])   05/04/2016 Initial Diagnosis    Cancer of cardio-esophageal junction ([place])      05/04/2016 Procedure    EGD showed a large ulcerating mass with no active bleeding at the gastroesophageal junction extending into the gastric cardia. The mass was not obstructing and partially circumferential, extends approximately 5 cm. Nonbleeding erosive gastropathy.      05/04/2016 Initial Biopsy    Esophageal gastric junction biopsy showed a poorly differentiated carcinoma underlying the squamous mucosa. There is lymphovascular invasion. No Intestinal metaplasia. IHC weakly positive CK5/6, p63 (-), favor squamous.        05/09/2016 Imaging    CT CAP w contrast showed mild wall thickening involving the distal esophagus and proximal stomach compatible with known cancer, enlarged mediastinal and upper abdominal lymph nodes are highly suspicious for metastatic adenopathy, propable liver cirrhosis.      06/04/2016 - 06/22/2016 Radiation Therapy   palliative radiation to esophageal cancer       07/06/2016 -  Chemotherapy    chemotherapy with weekly carboplatin and taxol, started on 07/06/2016, held 08/21/16-09/28/2016 due to hospitalization, changed to every 2 weeks from 11/30/2016      07/30/2016 Pathology Results    PD-L1 negative expression      08/28/2016 - 08/31/2016 Hospital Admission    The patient was admitted to 4 severe hypoglycemia, dehydration, and acute renal failure. Infection workup was negative. He recovered well with supportive care. His insulin and hypertension medication was held on discharge, except insulin sliding scale.      09/24/2016 Imaging    CT CAP W CONTRAST 09/26/2016 IMPRESSION: Decreased masslike soft tissue prominence at gastroesophageal junction, consistent with decreased size of primary gastroesophageal junction carcinoma. Resolution of paraesophageal and gastrohepatic ligament lymphadenopathy since prior exam. Other sub-cm mediastinal lymph nodes show little or no significant change. Stable indeterminate sub-cm low-attenuation lesion in the liver dome. Probable hepatic cirrhosis. Recommend continued attention on follow-up CT. No new or progressive metastatic disease identified. Cholelithiasis.  No radiographic evidence of cholecystitis. Colonic diverticulosis. No radiographic evidence of diverticulitis. Aortic atherosclerosis and three-vessel coronary artery calcification.      01/07/2017 Imaging    CT CAP w contrast 1. No residual esophageal mass or adenopathy. 2. New nodular airspace opacification in the left lower lobe. Continued attention on followup exams is warranted as metastatic disease cannot be definitively excluded. 3. Cirrhosis. 4. Trace left pleural effusion. 5. Aortic atherosclerosis (ICD10-170.0). Coronary artery calcification. 6. Cholelithiasis. 7. Mild basilar predominant subpleural reticular densities. Difficult to exclude interstitial lung disease.       HISTORY OF  PRESENTING ILLNESS:  [name] 71 y.o. male is here because of His reason that diagnosed GE junction carcinoma. He is a comment of by his wife and daughter to our multidisciplinary chart clinic today.  He has been having progressive dysphagia and odynophagia for 2 month, he has been eating soft diet only in the past one week. He has pain in the mid chest and epigastric area only when he eats, with burning sensation, no nausea, abdominal bloating, or other discomfort. His appetite has remained well, no weight loss,   He has multiple medical problems, especially heart failure, he has been on oxygen continuously for 5-6 years. He is morbidly obese, has diabetes, hypertension, mild neuropathy, OSA etc. He has very sedentary life style, he sits in the chair and watch TV most of time during the day. He only comes out for shopping for a few times a week. He uses a Barr[employer], he can walk for a few hundrends feet before he has to stop to catch his breasts. He denies cough or sputum production, no GI discomfort, he has been having mild dirrhea, loose stool, twice daily, no melena or hematochezia.  CURRENT THERAPY: chemotherapy with weekly carboplatin and taxol, started on 07/06/2016, held 08/21/16-09/28/2016 due to hospitalization, changed to every 2 weeks from 11/30/2016  INTERIM HISTORY:  Mr. Fairbank returns for follow-up and treatment. He is doing well overall. He has been tolerating chemotherapy well, no significant nausea, leg cramps, or other side effects. His appetite has remained to be well, no dysphagia. He is not very physically active due to his oxygen dependence and comorbidities. No other new complaints.  MEDICAL HISTORY:  Past Medical History:  Diagnosis Date  . Asthma    as a child  . Cataract    multiple types, bilateral  . Cholelithiasis without cholecystitis   . Chronic diastolic CHF (congestive heart failure) ([place]) 02/25/2009  . Chronic renal insufficiency, stage III (moderate)  2017   Stage II/III (GFR around 60)  . 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 COPD (chronic obstructive pulmonary disease) ([place])   . Coronary artery disease    chronically occluded LAD and diagonal with right to left collaterals, mild disease in the left circ and moderate disease in the mid RCA on medical management with Imdur, ASA, and Plavix.  . Dermatitis 05/2014  . DIABETES MELLITUS, TYPE II 06/24/2007   No diab retpthy as of 08/05/15 eye exam.  . Esophageal cancer ([place]) 04/2016   poorly differentiated carcinoma (Dr. Pyrtle--EGD).  CT C/A/P showed metastatic adenopathy in mediastinum and upper abdomen 05/09/16.  Tx plan is palliative radiation (completed 06/22/16), then palliative systemic chemotherapy (carbo+taxol) was started but as of 08/03/16 onc f/u this was held due to severe knee and ankle arthralgias.  Restarting as of 10/2015 (08/2016 CT showed some disease regression  . Esophageal cancer ([place])    CT 12/2016 showed no residual esoph mass--plan to continue chemo  . Essential hypertension 05/01/2007   Qualifier: Diagnosis of  By: Tiney Rouge CMA, Ellison Hughs     . History of kidney stones   . Hyperkalemia 12/2015   Decreased ACE-I by 50% in response, then potassium normalized.  Marland Kitchen HYPERLIPIDEMIA 06/24/2007  . Hypoxemia 01/27/2010  . Myocardial infarction    pt states he was informed per MD that he has had one but pt was unaware   . OBESITY 09/02/2008  . Obesity hypoventilation syndrome (HCC)    oxygen  24/7 (2 liters [place] as of 02/2016)  . On home oxygen therapy    Oxygen @ 2l/m nasally 24/7 hours  . OSA (obstructive sleep apnea)    not tested; pt scored 4 per stop bang tool results sent to PCP   . OSTEOARTHRITIS 05/01/2007  . Presbycusis of both ears 05/2015   [facility]  . Pruritic condition 05/2014   Allergist summer 2015, no new testing.  [name] field defect 05/10/16   Per Dr. Melina Fiddler, O.D.: Rt, loss of inf/temp quad and some loss  sup/temp.  ?CVA  ? Pituitary tumor? ?Brain met.    SURGICAL HISTORY: Past Surgical History:  Procedure Laterality Date  . APPENDECTOMY    . BALLOON DILATION N/A 05/04/2016   Procedure: BALLOON DILATION;  Surgeon: Jerene Bears, MD;  Location: [facility];  Service: Gastroenterology;  Laterality: N/A;  . CARDIAC CATHETERIZATION    . CARPAL TUNNEL RELEASE Left   . CATARACT EXTRACTION W/PHACO Right 04/29/2013   Procedure: CATARACT EXTRACTION PHACO AND INTRAOCULAR LENS PLACEMENT (IOC);  Surgeon: Adonis Brook, MD;  Location: [place] [facility];  Service: Ophthalmology;  Laterality: Right;  . CATARACT EXTRACTION, BILATERAL    . COLONOSCOPY W/ POLYPECTOMY  08/2011   Many polyps--all hyperplastic, severe diverticulosis, int hem.  [employer] hemoccult testing via [facility] 06/22/15 NEG  . ESOPHAGOGASTRODUODENOSCOPY N/A 05/04/2016   Procedure: ESOPHAGOGASTRODUODENOSCOPY (EGD);  Surgeon: Jerene Bears, MD;  Location: [facility];  Service: Gastroenterology;  Laterality: N/A;  . IR GENERIC HISTORICAL  10/03/2016   IR US GUIDE VASC ACCESS RIGHT 10/03/2016 Greggory Keen, MD [facility]  . IR GENERIC HISTORICAL  10/03/2016   IR FLUORO GUIDE PORT INSERTION RIGHT 10/03/2016 Greggory Keen, MD [facility]  . KNEE ARTHROSCOPY WITH MEDIAL MENISECTOMY Right 12/16/2014   Procedure: RIGHT KNEE ARTHROSCOPY WITH MEDIAL MENISECTOMY microfracture medial femoral condyle abrasion condroplasty medial femoral condyle lateral menisectomy;  Surgeon: Tobi Bastos, MD;  Location: [facility];  Service: Orthopedics;  Laterality: Right;  . LEFT HEART CATHETERIZATION WITH CORONARY ANGIOGRAM N/A 10/26/2013   Procedure: LEFT HEART CATHETERIZATION WITH CORONARY ANGIOGRAM;  Surgeon: Jettie Booze, MD;  Location: [facility] CATH LAB;  Service: Cardiovascular;  Laterality: N/A;  . LUMBAR [place] SURGERY  2001  . SHOULDER SURGERY Right    right fx  . TONSILLECTOMY    . TRANSTHORACIC ECHOCARDIOGRAM  03/2016   EF 55-60%, grade I DD, LAE  . WRIST SURGERY Right     right fx    SOCIAL HISTORY: Social History   Social History  . Marital status: Married    Spouse name: susan  . Number of children: 2  . Years of education: N/A   Occupational History  . Retired    Social History Main Topics  . Smoking status: Former Smoker    Packs/day: 1.50    Years: 30.00    Types: Cigarettes, Pipe, Cigars    Quit date: 10/30/1983  . Smokeless tobacco: Former Systems developer    Types: Chew    Quit date: 05/15/2016  . Alcohol use No  . Drug use: No  . Sexual activity: Not Currently   Other Topics Concern  . Not on file   Social History Narrative   Married, one son and one daughter.   His daughter and her two children live with him.   Coffee daily.  Former smoker.  No alcohol.   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   Attends church weekly-   Oxygen continuous  FAMILY HISTORY: Family History  Problem Relation Age of Onset  . Heart attack Mother   . Aneurysm Father   . Alcohol abuse Father   . Diabetes Maternal Aunt   . Colon cancer Neg Hx     ALLERGIES:  has No Known Allergies.  MEDICATIONS:  Current Outpatient Prescriptions  Medication Sig Dispense Refill  . aspirin 81 MG tablet Take 81 mg by mouth daily.      Marland Kitchen atorvastatin (LIPITOR) 80 MG tablet TAKE ONE TABLET BY MOUTH ONCE DAILY 90 tablet 2  . beta carotene w/minerals (OCUVITE) tablet Take 1 tablet by mouth daily.    . cetirizine (ZYRTEC) 10 MG tablet Take 10 mg by mouth daily.    . citalopram (CELEXA) 20 MG tablet TAKE ONE TABLET BY MOUTH ONCE DAILY 90 tablet 1  . clonazePAM (KLONOPIN) 1 MG tablet TAKE ONE TABLET BY MOUTH TWICE DAILY AS NEEDED FOR ANXIETY 60 tablet 5  . cromolyn (OPTICROM) 4 % ophthalmic solution Place 1 drop into both eyes 4 (four) times daily.    Marland Kitchen gabapentin (NEURONTIN) 300 MG capsule TAKE ONE CAPSULE BY MOUTH THREE TIMES DAILY 270 capsule 3  . hydrOXYzine (ATARAX/VISTARIL) 25 MG tablet TAKE 1 TABLET BY MOUTH AT SUPPER AND 1 TABLET BY  MOUTH AT BEDTIME FOR INSOMNIA 60 tablet 6  . insulin lispro (HUMALOG KWIKPEN) 100 UNIT/ML KiwkPen Inject into the skin. SS at meal time    . Insulin NPH, Human,, Isophane, (HUMULIN N) 100 UNIT/ML Kiwkpen 7 units every morning and 7 units every evening 15 mL 3  . magnesium chloride (SLOW-MAG) 64 MG TBEC SR tablet Take 1 tablet (64 mg total) by mouth daily. 60 tablet 0  . metFORMIN (GLUCOPHAGE) 1000 MG tablet TAKE ONE TABLET BY MOUTH TWICE DAILY WITH MEALS 180 tablet 1  . Omega-3 Fatty Acids ([employer] FISH OIL) 1000 MG CAPS Take 2 tablets by mouth 2 (two) times daily. 90 capsule   . ONE TOUCH ULTRA TEST test strip     . ONETOUCH DELICA LANCETS 00X MISC     . oxyCODONE (OXY IR/ROXICODONE) 5 MG immediate release tablet Take 1 tablet (5 mg total) by mouth every 8 (eight) hours as needed for severe pain. 30 tablet 0  . OXYGEN Inhale 2 L/min into the lungs continuous.    . pantoprazole (PROTONIX) 40 MG tablet TAKE ONE TABLET BY MOUTH TWICE DAILY 60 tablet 3  . ZETIA 10 MG tablet TAKE ONE TABLET BY MOUTH ONCE DAILY 90 tablet 1  . ondansetron (ZOFRAN) 8 MG tablet Take 1 tablet (8 mg total) by mouth 2 (two) times daily as needed for refractory nausea / vomiting. Start on day 3 after chemo. (Patient not taking: Reported on 02/06/2017) 30 tablet 1  . prochlorperazine (COMPAZINE) 10 MG tablet Take 1 tablet (10 mg total) by mouth every 6 (six) hours as needed (Nausea or vomiting). (Patient not taking: Reported on 02/06/2017) 30 tablet 1   No current facility-administered medications for this visit.     REVIEW OF SYSTEMS:   Constitutional: Denies fevers, abnormal night sweats,  Eyes: Denies blurriness of vision, double vision or watery eyes Ears, nose, mouth, throat, and face: Denies mucositis or sore throat, (+) trouble swallowing Respiratory: Denies cough, dyspnea or wheezes Cardiovascular: Denies palpitation, chest discomfort or lower extremity swelling Gastrointestinal:  Denies nausea, heartburn or change in  bowel habits (+) diarrhea Skin: Denies abnormal skin rashes Lymphatics: Denies new lymphadenopathy or easy bruising Neurological:Denies numbness, tingling or new weaknesses  Behavioral/Psych: Mood  is stable, no new changes  All other systems were reviewed with the patient and are negative.  PHYSICAL EXAMINATION: ECOG PERFORMANCE STATUS: 3 - Symptomatic, >50% confined to bed  Vitals:   02/06/17 1305  BP: 139/65  Pulse: (!) 107  Resp: 18  Temp: 98.6 F (37 C)   Filed Weights   02/06/17 1305  Weight: 273 lb 14.4 oz (124.2 kg)   GENERAL:alert, no distress and comfortable, morbid obese, sitting in wheelchair with nasal cannula oxygen  SKIN: skin color, texture, turgor are normal, no rashes or significant lesions EYES: normal, conjunctiva are pink and non-injected, sclera clear OROPHARYNX:no exudate, no erythema and lips, buccal mucosa, and tongue normal  NECK: supple, thyroid normal size, non-tender, without nodularity LYMPH:  no palpable lymphadenopathy in the cervical, axillary or inguinal LUNGS: clear, no wheezing HEART: regular rate & rhythm and no murmurs and no lower extremity edema ABDOMEN:abdomen soft, non-tender and normal bowel sounds Musculoskeletal:no cyanosis of digits and no clubbing  PSYCH: alert & oriented x 3 with fluent speech NEURO: no focal motor/sensory deficits EXT: Trace edema, he wears compression stocks, no significant ankle swollen, erythema, or warmness.   LABORATORY DATA:  I have reviewed the data as listed CBC Latest Ref Rng & Units 02/06/2017 01/29/2017 01/15/2017  WBC 4.0 - 10.3 10e3/uL 3.0(L) 1.8(L) 2.7(L)  Hemoglobin 13.0 - 17.1 g/dL 10.4(L) 9.9(L) 9.8(L)  Hematocrit 38.4 - 49.9 % 32.3(L) 30.6(L) 30.2(L)  Platelets 140 - 400 10e3/uL 142 116(L) 109(L)   CMP Latest Ref Rng & Units 02/06/2017 01/29/2017 01/15/2017  Glucose 70 - 140 mg/dl 139 163(H) 106  BUN 7.0 - 26.0 mg/dL 11.5 8.5 10.7  Creatinine 0.7 - 1.3 mg/dL 0.8 0.8 0.8  Sodium 136 - 145 mEq/L  140 139 140  Potassium 3.5 - 5.1 mEq/L 3.9 4.1 4.1  Chloride 96 - 106 mmol/L - - -  CO2 22 - 29 mEq/L _0 Calcium 8.4 - 10.4 mg/dL 8.9 8.9 8.7  Total Protein 6.4 - 8.3 g/dL 6.3(L) 6.2(L) 6.2(L)  Total Bilirubin 0.20 - 1.20 mg/dL 0.65 0.71 0.75  Alkaline Phos 40 - 150 U/L 63 72 70  AST 5 - 34 U/L 28 43(H) 42(H)  ALT 0 - 55 U/L 25 43 42   ANC 1.8 TODAY  PATHOLOGY REPORT Diagnosis 05/04/2016 1. Esophagogastric junction, biopsy, ulcerative mass - POORLY DIFFERENTIATED CARCINOMA, SEE COMMENT. 2. Stomach, biopsy - REACTIVE GASTROPATHY. - NEGATIVE FOR HELICOBACTER PYLORI. - NO INTESTINAL METAPLASIA, DYSPLASIA, OR MALIGNANCY. Microscopic Comment 1. The majority of the specimen consists of gastroesophageal mucosa with reflux changes. There is a small focus of tumor underlying the squamous mucosa. There is lymphovascular invasion. There is no background intestinal metaplasia (Barrett's esophagus). Immunohistochemistry reveals only very focal weak cytokeratin 5/6, negative p63, and negative mucicarmine in the limited remaining tumor. Dr. Saralyn Pilar has reviewed the case. The case was called to Dr. Hilarie Fredrickson on 05/08/2016. 2. A Warthin-Starry stain is performed to determine the possibility of the presence of Helicobacter pylori. The Warthin-Starry stain is negative for organisms of Helicobacter pylori.     RADIOGRAPHIC STUDIES: I have personally reviewed the radiological images as listed and agreed with the findings in the report.  CT CAP w contrast 01/07/2017 IMPRESSION: 1. No residual esophageal mass or adenopathy. 2. New nodular airspace opacification in the left lower lobe. Continued attention on followup exams is warranted as metastatic disease cannot be definitively excluded. 3. Cirrhosis. 4. Trace left pleural effusion. 5. Aortic atherosclerosis (ICD10-170.0). Coronary artery calcification. 6. Cholelithiasis.  7. Mild basilar predominant subpleural reticular densities. Difficult  to exclude interstitial lung disease.  EGD 05/04/2016 A large, ulcerating mass with no active bleeding and no stigmata of recent bleeding was found at the gastroesophageal junction extending into the gastric cardia, beginning 40 cm from the incisors. The mass was non-obstructing and partially circumferential (involving one-half of the lumen circumference). The mass extends approximately 5 cm. IMPRESSION:  -Likely malignant esophageal tumor was found at the gastroesophageal junction extending into the gastric cardia. Biopsied. - Non-bleeding erosive gastropathy. Biopsied. - Erythematous duodenopathy. - Normal second portion of the duodenum.  ASSESSMENT & PLAN: 71 y.o. Caucasian male, with multiple comorbidities, including hypertension, diabetes, OSA, coronary artery disease, diastolic CHF, on continuous oxygen, morbid obesity, presented with progressive dysphagia and odynophagia.  1. Cancer of cardio-esophageal junction, poorly differentiated, cTxN2M1, stage IV  -I previously reviewed his CT scan, EGD, and the biopsy results with patient and his family members in great details. -His biopsy pathology was previously reviewed in our tumor board, the morphology and weakly positive for CK 5/6, fevers squamous cell carcinoma -His CT scan findings are very concerning for distant metastasis to abdominal lymph nodes.  -I previously reviewed the PET scan images with patient and his daughter in person, he has hypermetabolic GE junction tumor, and diffuse adenopathy in mediastinum and upper abdomen. -We previously discussed the aggressive nature of esophageal cancer, an incurable nature of his disease due to the distant metastasis. The goal of therapy is palliative, to prolong his life and palliate his symptoms. -His tumor was negative for PD-L1, not a candidate for immunotherapy  -He is on first line palliative chemotherapy with weekly carbo and Taxol, every 2 weeks, tolerating low dose better lately. -I  previously reviewed his CT scan findings on 09/26/2016 show good partial response to chemoradiation, no new or progressive metastatic disease. - I previously  reviewed his restaging CT scan from 01/07/2017,  Which showed no residual esophageal mass or adenopathy, he has mild new infiltrative change in LLL, possible related to aspiration, he is asymptomatic, will continue monitoring  -He is clinically doing well, lab results reviewed with patient, mild pancytopenia, secondary to chemotherapy, adequate for treatment, we'll continue low-dose carboplatin and Taxol every 2 weeks. -plan to repeat CT scan in early June   2. CAD, diastolic CHF, on continuous oxygen -He will continue follow-up with his cardiologist Dr. Radford Pax -His Plavix has been held since the EGD and biopsy, he is on baby aspirin. -Need to watch his fluid intake during his chemotherapy, and also avoid fluid overload from chemo  -he is clinically stable  3. DM, HTN -We previously discussed his blood pressure and glucose need to be monitored closely during the chemotherapy, which may affect his blood glucose and blood pressure -He will continue medication, monitor his sugar and blood pressure at home, and follow-up with his primary care physician. -will decrease premed dexa before taxol  -History simple has been held due to his borderline low blood pressure -He will see his primary care physician Dr. Anitra Lauth for diabetes management.  4. OSA, morbid obesity -We previously discussed healthy diet, I encouraged him to to be more physically active, and consider home PT -I strongly encouraged him to follow-up with dietitian during the therapy for nutrition support.  5. Epigastric pain -Likely secondary to his underlying malignancy -worse after radiation, resolved now  -continue tramadol as need, he has not need it lately.  6. Arthralgia -He has baseline osteoarthritis, not physically active -However his arthralgia in  his knees and ankle  are much worse after chemotherapy but resolved now  -He will use oxycodone 5 mg as needed, constipation management previously reviewed with him. -Leg weakness improved with physical therapy. Previously encouraged the patient to continue with physical therapy.   7. Anemia -Second is to underlying malignancy and chemotherapy -Mild anemia is stable, his hemoglobin is 10.4 today -we'll consider blood transfusion if hemoglobin less than 8, for symptomatic anemia.  8. Goal of care discussion  -We previously discussed the incurable nature of his cancer, and the overall poor prognosis, especially if he does not have good response to chemotherapy or progress on chemo -The patient understands the goal of care is palliative. -Patient is very realistic, understands his cancer is not curable, and he agrees with DO NOT RESUSCITATE and DO NOT INTUBATE  9. Diarrhea -For a couple of days after treatment. He does not take Imodium -I encouraged him to take Imodium for these days.   PLAN -Scan and lab results reviewed with patient. ANC 1.8, platelet 142K, adequate for treatment. He will receive low dose chemo carboplatin and Taxol treatment today, and continue every 2 weeks. -Move all appointments on 02/13/17 to 02/20/17. -Lab, flush, f/u, and chemo Carbo and Taxol in 4 weeks. -repeat CT scan in early June   All questions were answered. The patient knows to call the clinic with any problems, questions or concerns.  I spent 20 minutes counseling the patient face to face. The total time spent in the appointment was 25 minutes and more than 35% was on counseling.    Truitt Merle, MD 02/06/2017   This document serves as a record of services personally performed by Truitt Merle, MD. It was created on her behalf by Darcus Austin, a trained medical scribe. The creation of this record is based on the scribe's personal observations and the provider's statements to them. This document has been checked and approved by the  attending provider.

## 2017-02-06 ENCOUNTER — Ambulatory Visit (HOSPITAL_BASED_OUTPATIENT_CLINIC_OR_DEPARTMENT_OTHER): Payer: Medicare Other | Admitting: Hematology

## 2017-02-06 ENCOUNTER — Other Ambulatory Visit (HOSPITAL_BASED_OUTPATIENT_CLINIC_OR_DEPARTMENT_OTHER): Payer: Medicare Other

## 2017-02-06 ENCOUNTER — Other Ambulatory Visit (HOSPITAL_COMMUNITY)
Admission: RE | Admit: 2017-02-06 | Discharge: 2017-02-06 | Disposition: A | Discharge status: Home / Self Care | Payer: Medicare Other | Source: Ambulatory Visit | Attending: Hematology | Admitting: Hematology

## 2017-02-06 ENCOUNTER — Encounter: Payer: Self-pay | Admitting: Hematology

## 2017-02-06 ENCOUNTER — Ambulatory Visit (HOSPITAL_BASED_OUTPATIENT_CLINIC_OR_DEPARTMENT_OTHER): Payer: Medicare Other

## 2017-02-06 ENCOUNTER — Ambulatory Visit: Payer: Medicare Other

## 2017-02-06 ENCOUNTER — Telehealth: Payer: Self-pay | Admitting: Hematology

## 2017-02-06 VITALS — BP 139/65 | HR 107 | Temp 98.6°F | Resp 18 | Ht 69.0 in | Wt 273.9 lb

## 2017-02-06 DIAGNOSIS — Z5111 Encounter for antineoplastic chemotherapy: Secondary | ICD-10-CM | POA: Diagnosis not present

## 2017-02-06 DIAGNOSIS — C160 Malignant neoplasm of cardia: Secondary | ICD-10-CM | POA: Diagnosis not present

## 2017-02-06 DIAGNOSIS — I10 Essential (primary) hypertension: Secondary | ICD-10-CM | POA: Diagnosis not present

## 2017-02-06 DIAGNOSIS — Z95828 Presence of other vascular implants and grafts: Secondary | ICD-10-CM

## 2017-02-06 DIAGNOSIS — D630 Anemia in neoplastic disease: Secondary | ICD-10-CM

## 2017-02-06 DIAGNOSIS — E119 Type 2 diabetes mellitus without complications: Secondary | ICD-10-CM | POA: Diagnosis not present

## 2017-02-06 DIAGNOSIS — I5032 Chronic diastolic (congestive) heart failure: Secondary | ICD-10-CM

## 2017-02-06 LAB — CBC WITH DIFFERENTIAL/PLATELET
BASO%: 0.9 % (ref 0.0–2.0)
Basophils Absolute: 0 10*3/uL (ref 0.0–0.1)
EOS ABS: 0.1 10*3/uL (ref 0.0–0.5)
EOS%: 1.7 % (ref 0.0–7.0)
HCT: 32.3 % — ABNORMAL LOW (ref 38.4–49.9)
HEMOGLOBIN: 10.4 g/dL — AB (ref 13.0–17.1)
LYMPH%: 21.3 % (ref 14.0–49.0)
MCH: 29.7 pg (ref 27.2–33.4)
MCHC: 32.1 g/dL (ref 32.0–36.0)
MCV: 92.5 fL (ref 79.3–98.0)
MONO#: 0.5 10*3/uL (ref 0.1–0.9)
MONO%: 16.4 % — AB (ref 0.0–14.0)
NEUT#: 1.8 10*3/uL (ref 1.5–6.5)
NEUT%: 59.7 % (ref 39.0–75.0)
Platelets: 142 10*3/uL (ref 140–400)
RBC: 3.49 10*6/uL — ABNORMAL LOW (ref 4.20–5.82)
RDW: 20.4 % — AB (ref 11.0–14.6)
WBC: 3 10*3/uL — ABNORMAL LOW (ref 4.0–10.3)
lymph#: 0.7 10*3/uL — ABNORMAL LOW (ref 0.9–3.3)

## 2017-02-06 LAB — COMPREHENSIVE METABOLIC PANEL
ALBUMIN: 3.2 g/dL — AB (ref 3.5–5.0)
ALK PHOS: 63 U/L (ref 40–150)
ALT: 25 U/L (ref 0–55)
AST: 28 U/L (ref 5–34)
Anion Gap: 11 mEq/L (ref 3–11)
BILIRUBIN TOTAL: 0.65 mg/dL (ref 0.20–1.20)
BUN: 11.5 mg/dL (ref 7.0–26.0)
CALCIUM: 8.9 mg/dL (ref 8.4–10.4)
CO2: 26 mEq/L (ref 22–29)
Chloride: 103 mEq/L (ref 98–109)
Creatinine: 0.8 mg/dL (ref 0.7–1.3)
EGFR: 89 mL/min/{1.73_m2} — AB (ref >90)
Glucose: 139 mg/dl (ref 70–140)
POTASSIUM: 3.9 meq/L (ref 3.5–5.1)
Sodium: 140 mEq/L (ref 136–145)
Total Protein: 6.3 g/dL — ABNORMAL LOW (ref 6.4–8.3)

## 2017-02-06 MED ORDER — SODIUM CHLORIDE 0.9 % IV SOLN
Freq: Once | INTRAVENOUS | Status: AC
  Administered 2017-02-06: 15:00:00 via INTRAVENOUS

## 2017-02-06 MED ORDER — HEPARIN SOD (PORK) LOCK FLUSH 100 UNIT/ML IV SOLN
500.0000 [IU] | Freq: Once | INTRAVENOUS | Status: AC | PRN
  Administered 2017-02-06: 500 [IU]
  Filled 2017-02-06: qty 5

## 2017-02-06 MED ORDER — PALONOSETRON HCL INJECTION 0.25 MG/5ML
0.2500 mg | Freq: Once | INTRAVENOUS | Status: AC
  Administered 2017-02-06: 0.25 mg via INTRAVENOUS

## 2017-02-06 MED ORDER — SODIUM CHLORIDE 0.9 % IV SOLN
70.0000 mg/m2 | Freq: Once | INTRAVENOUS | Status: AC
  Administered 2017-02-06: 180 mg via INTRAVENOUS
  Filled 2017-02-06: qty 30

## 2017-02-06 MED ORDER — SODIUM CHLORIDE 0.9% FLUSH
10.0000 mL | INTRAVENOUS | Status: DC | PRN
  Administered 2017-02-06: 10 mL via INTRAVENOUS
  Filled 2017-02-06: qty 10

## 2017-02-06 MED ORDER — DEXAMETHASONE SODIUM PHOSPHATE 10 MG/ML IJ SOLN
10.0000 mg | Freq: Once | INTRAMUSCULAR | Status: AC
  Administered 2017-02-06: 10 mg via INTRAVENOUS

## 2017-02-06 MED ORDER — FAMOTIDINE IN NACL 20-0.9 MG/50ML-% IV SOLN
20.0000 mg | Freq: Once | INTRAVENOUS | Status: AC
  Administered 2017-02-06: 20 mg via INTRAVENOUS

## 2017-02-06 MED ORDER — OXYCODONE HCL 5 MG PO TABS: 5.0000 mg | ORAL_TABLET | Freq: Three times a day (TID) | ORAL | 0 refills | Status: DC | PRN

## 2017-02-06 MED ORDER — DIPHENHYDRAMINE HCL 50 MG/ML IJ SOLN
INTRAMUSCULAR | Status: AC
  Filled 2017-02-06: qty 1

## 2017-02-06 MED ORDER — SODIUM CHLORIDE 0.9% FLUSH
10.0000 mL | INTRAVENOUS | Status: DC | PRN
  Administered 2017-02-06: 10 mL
  Filled 2017-02-06: qty 10

## 2017-02-06 MED ORDER — FAMOTIDINE IN NACL 20-0.9 MG/50ML-% IV SOLN
INTRAVENOUS | Status: AC
  Filled 2017-02-06: qty 50

## 2017-02-06 MED ORDER — DEXAMETHASONE SODIUM PHOSPHATE 10 MG/ML IJ SOLN
INTRAMUSCULAR | Status: AC
  Filled 2017-02-06: qty 1

## 2017-02-06 MED ORDER — PALONOSETRON HCL INJECTION 0.25 MG/5ML
INTRAVENOUS | Status: AC
  Filled 2017-02-06: qty 5

## 2017-02-06 MED ORDER — DIPHENHYDRAMINE HCL 50 MG/ML IJ SOLN
25.0000 mg | Freq: Once | INTRAMUSCULAR | Status: AC
  Administered 2017-02-06: 25 mg via INTRAVENOUS

## 2017-02-06 MED ORDER — SODIUM CHLORIDE 0.9 % IV SOLN
270.0000 mg | Freq: Once | INTRAVENOUS | Status: AC
  Administered 2017-02-06: 270 mg via INTRAVENOUS
  Filled 2017-02-06: qty 27

## 2017-02-06 NOTE — Patient Instructions (Signed)
[facility] Discharge Instructions for Patients Receiving Chemotherapy  Today you received the following chemotherapy agents taxol/carboplatin  To help prevent nausea and vomiting after your treatment, we encourage you to take your nausea medication as directed   If you develop nausea and vomiting that is not controlled by your nausea medication, call the clinic.   BELOW ARE SYMPTOMS THAT SHOULD BE REPORTED IMMEDIATELY:  *FEVER GREATER THAN 100.5 F  *CHILLS WITH OR WITHOUT FEVER  NAUSEA AND VOMITING THAT IS NOT CONTROLLED WITH YOUR NAUSEA MEDICATION  *UNUSUAL SHORTNESS OF BREATH  *UNUSUAL BRUISING OR BLEEDING  TENDERNESS IN MOUTH AND THROAT WITH OR WITHOUT PRESENCE OF ULCERS  *URINARY PROBLEMS  *BOWEL PROBLEMS  UNUSUAL RASH Items with * indicate a potential emergency and should be followed up as soon as possible.  Feel free to call the clinic you have any questions or concerns. The clinic phone number is [phone].  

## 2017-02-06 NOTE — Telephone Encounter (Signed)
Scheduled appts per 4/11 los. Patient to get new schedule in the treatment area.

## 2017-02-06 NOTE — Telephone Encounter (Signed)
SW pt and he stated that he has been checking his blood sugar twice a day. Once in the morning before breakfast and once before supper. He stated that the morning readings have been averaging around 150 but was 130 this morning. He stated that his blood sugar before supper has been averaging between 150-170.  He stated that he will be going to the cancer center today at 12:00 and will not be back home this afternoon. He stated that we can leave a message with his wife or call tomorrow.   Please advise. Thanks.

## 2017-02-06 NOTE — Telephone Encounter (Signed)
In my last o/v note, I documented that he was taking humalog 15 U qAC and  NPH 15 U bid. Based on the numbers he reported today, if he has increased his NPH to 20 U in morning and 20 in the evening, then I would only increase to 16 U qAM and 16 U qPM.   Confirm that he is still taking 15 U humalog with each meal. Reassure him that his glucose numbers are very close to normal and he should not be alarmed.--thx

## 2017-02-06 NOTE — Telephone Encounter (Signed)
Please advise. Thanks.  

## 2017-02-06 NOTE — Telephone Encounter (Signed)
Pt was in the restroom when I called. I will try back tomorrow.

## 2017-02-06 NOTE — Telephone Encounter (Signed)
OK.  Before I change anything, ask:  Is he monitoring his glucoses?  If so, how many times per day, when, and pls try to get him to tell the average number or range he sees at each check.

## 2017-02-07 ENCOUNTER — Encounter: Payer: Self-pay | Admitting: Hematology

## 2017-02-07 MED ORDER — INSULIN ISOPHANE HUMAN 100 UNIT/ML KWIKPEN: PEN_INJECTOR | SUBCUTANEOUS | 3 refills | Status: DC

## 2017-02-07 NOTE — Telephone Encounter (Signed)
SW pt and he was advised to take 16 units of NPH in the morning and evening and take only 15 units of the Humalog with every meal. Pt voiced understanding.

## 2017-02-12 ENCOUNTER — Encounter: Payer: Self-pay | Admitting: Cardiology

## 2017-02-13 ENCOUNTER — Ambulatory Visit: Payer: Medicare Other

## 2017-02-13 ENCOUNTER — Other Ambulatory Visit: Payer: Medicare Other

## 2017-02-13 ENCOUNTER — Ambulatory Visit: Payer: Medicare Other | Admitting: Hematology

## 2017-02-19 ENCOUNTER — Other Ambulatory Visit: Payer: Self-pay | Admitting: Hematology

## 2017-02-20 ENCOUNTER — Other Ambulatory Visit (HOSPITAL_BASED_OUTPATIENT_CLINIC_OR_DEPARTMENT_OTHER): Payer: Medicare Other

## 2017-02-20 ENCOUNTER — Ambulatory Visit (HOSPITAL_BASED_OUTPATIENT_CLINIC_OR_DEPARTMENT_OTHER): Payer: Medicare Other

## 2017-02-20 VITALS — BP 117/57 | HR 84 | Temp 98.4°F | Resp 18

## 2017-02-20 DIAGNOSIS — C160 Malignant neoplasm of cardia: Secondary | ICD-10-CM

## 2017-02-20 DIAGNOSIS — Z5111 Encounter for antineoplastic chemotherapy: Secondary | ICD-10-CM | POA: Diagnosis not present

## 2017-02-20 LAB — CBC WITH DIFFERENTIAL/PLATELET
BASO%: 0.7 % (ref 0.0–2.0)
BASOS ABS: 0 10*3/uL (ref 0.0–0.1)
EOS%: 2.7 % (ref 0.0–7.0)
Eosinophils Absolute: 0.1 10*3/uL (ref 0.0–0.5)
HCT: 31.4 % — ABNORMAL LOW (ref 38.4–49.9)
HGB: 10.2 g/dL — ABNORMAL LOW (ref 13.0–17.1)
LYMPH%: 19.7 % (ref 14.0–49.0)
MCH: 29.8 pg (ref 27.2–33.4)
MCHC: 32.5 g/dL (ref 32.0–36.0)
MCV: 91.6 fL (ref 79.3–98.0)
MONO#: 0.5 10*3/uL (ref 0.1–0.9)
MONO%: 17.9 % — ABNORMAL HIGH (ref 0.0–14.0)
NEUT%: 59 % (ref 39.0–75.0)
NEUTROS ABS: 1.7 10*3/uL (ref 1.5–6.5)
Platelets: 129 10*3/uL — ABNORMAL LOW (ref 140–400)
RBC: 3.43 10*6/uL — AB (ref 4.20–5.82)
RDW: 19.6 % — AB (ref 11.0–14.6)
WBC: 2.9 10*3/uL — ABNORMAL LOW (ref 4.0–10.3)
lymph#: 0.6 10*3/uL — ABNORMAL LOW (ref 0.9–3.3)

## 2017-02-20 LAB — COMPREHENSIVE METABOLIC PANEL
ALT: 39 U/L (ref 0–55)
AST: 41 U/L — ABNORMAL HIGH (ref 5–34)
Albumin: 3.3 g/dL — ABNORMAL LOW (ref 3.5–5.0)
Alkaline Phosphatase: 65 U/L (ref 40–150)
Anion Gap: 7 mEq/L (ref 3–11)
BILIRUBIN TOTAL: 0.7 mg/dL (ref 0.20–1.20)
BUN: 14.9 mg/dL (ref 7.0–26.0)
CO2: 27 meq/L (ref 22–29)
CREATININE: 0.9 mg/dL (ref 0.7–1.3)
Calcium: 9.1 mg/dL (ref 8.4–10.4)
Chloride: 104 mEq/L (ref 98–109)
EGFR: 86 mL/min/{1.73_m2} — AB (ref >90)
GLUCOSE: 120 mg/dL (ref 70–140)
Potassium: 4.3 mEq/L (ref 3.5–5.1)
SODIUM: 138 meq/L (ref 136–145)
TOTAL PROTEIN: 6.3 g/dL — AB (ref 6.4–8.3)

## 2017-02-20 MED ORDER — PACLITAXEL CHEMO INJECTION 300 MG/50ML
70.0000 mg/m2 | Freq: Once | INTRAVENOUS | Status: AC
  Administered 2017-02-20: 180 mg via INTRAVENOUS
  Filled 2017-02-20: qty 30

## 2017-02-20 MED ORDER — FAMOTIDINE IN NACL 20-0.9 MG/50ML-% IV SOLN
20.0000 mg | Freq: Once | INTRAVENOUS | Status: AC
  Administered 2017-02-20: 20 mg via INTRAVENOUS

## 2017-02-20 MED ORDER — PALONOSETRON HCL INJECTION 0.25 MG/5ML: 0.2500 mg | Freq: Once | INTRAVENOUS | Status: DC

## 2017-02-20 MED ORDER — DIPHENHYDRAMINE HCL 50 MG/ML IJ SOLN
25.0000 mg | Freq: Once | INTRAMUSCULAR | Status: AC
  Administered 2017-02-20: 25 mg via INTRAVENOUS

## 2017-02-20 MED ORDER — SODIUM CHLORIDE 0.9% FLUSH
10.0000 mL | INTRAVENOUS | Status: DC | PRN
  Administered 2017-02-20: 10 mL
  Filled 2017-02-20: qty 10

## 2017-02-20 MED ORDER — FAMOTIDINE IN NACL 20-0.9 MG/50ML-% IV SOLN
INTRAVENOUS | Status: AC
  Filled 2017-02-20: qty 50

## 2017-02-20 MED ORDER — DEXAMETHASONE SODIUM PHOSPHATE 10 MG/ML IJ SOLN
INTRAMUSCULAR | Status: AC
  Filled 2017-02-20: qty 1

## 2017-02-20 MED ORDER — SODIUM CHLORIDE 0.9 % IV SOLN
270.0000 mg | Freq: Once | INTRAVENOUS | Status: AC
  Administered 2017-02-20: 270 mg via INTRAVENOUS
  Filled 2017-02-20: qty 27

## 2017-02-20 MED ORDER — PALONOSETRON HCL INJECTION 0.25 MG/5ML
INTRAVENOUS | Status: AC
  Filled 2017-02-20: qty 5

## 2017-02-20 MED ORDER — PALONOSETRON HCL INJECTION 0.25 MG/5ML
0.2500 mg | Freq: Once | INTRAVENOUS | Status: AC
  Administered 2017-02-20: 0.25 mg via INTRAVENOUS

## 2017-02-20 MED ORDER — DEXAMETHASONE SODIUM PHOSPHATE 10 MG/ML IJ SOLN
10.0000 mg | Freq: Once | INTRAMUSCULAR | Status: AC
  Administered 2017-02-20: 10 mg via INTRAVENOUS

## 2017-02-20 MED ORDER — HEPARIN SOD (PORK) LOCK FLUSH 100 UNIT/ML IV SOLN
500.0000 [IU] | Freq: Once | INTRAVENOUS | Status: AC | PRN
  Administered 2017-02-20: 500 [IU]
  Filled 2017-02-20: qty 5

## 2017-02-20 MED ORDER — SODIUM CHLORIDE 0.9 % IV SOLN
Freq: Once | INTRAVENOUS | Status: AC
  Administered 2017-02-20: 09:00:00 via INTRAVENOUS

## 2017-02-20 MED ORDER — DIPHENHYDRAMINE HCL 50 MG/ML IJ SOLN
INTRAMUSCULAR | Status: AC
  Filled 2017-02-20: qty 1

## 2017-02-20 NOTE — Patient Instructions (Signed)
[facility] Discharge Instructions for Patients Receiving Chemotherapy  Today you received the following chemotherapy agents Taxol and Carboplatin  To help prevent nausea and vomiting after your treatment, we encourage you to take your nausea medication as directed. No Zofran for 3 days. Take Compazine instead.   If you develop nausea and vomiting that is not controlled by your nausea medication, call the clinic.   BELOW ARE SYMPTOMS THAT SHOULD BE REPORTED IMMEDIATELY:  *FEVER GREATER THAN 100.5 F  *CHILLS WITH OR WITHOUT FEVER  NAUSEA AND VOMITING THAT IS NOT CONTROLLED WITH YOUR NAUSEA MEDICATION  *UNUSUAL SHORTNESS OF BREATH  *UNUSUAL BRUISING OR BLEEDING  TENDERNESS IN MOUTH AND THROAT WITH OR WITHOUT PRESENCE OF ULCERS  *URINARY PROBLEMS  *BOWEL PROBLEMS  UNUSUAL RASH Items with * indicate a potential emergency and should be followed up as soon as possible.  Feel free to call the clinic you have any questions or concerns. The clinic phone number is [phone].  Please show the CHEMO ALERT CARD at check-in to the Emergency Department and triage nurse.   

## 2017-02-26 ENCOUNTER — Other Ambulatory Visit: Payer: Self-pay | Admitting: Family Medicine

## 2017-02-28 ENCOUNTER — Other Ambulatory Visit: Payer: Self-pay | Admitting: Family Medicine

## 2017-03-02 ENCOUNTER — Telehealth: Payer: Self-pay | Admitting: Hematology

## 2017-03-02 NOTE — Telephone Encounter (Signed)
Appointments on 5/9 adjusted due to [facility]. Spoke with patient re new time for 12 pm.

## 2017-03-03 ENCOUNTER — Other Ambulatory Visit: Payer: Self-pay | Admitting: Family Medicine

## 2017-03-03 NOTE — Progress Notes (Signed)
Cardiology Office Note    Date:  03/04/2017   ID:  [name], DOB [birth year], MRN [id number]  PCP:  Tammi Sou, MD  Cardiologist:  Fransico Him, MD   Chief Complaint  Patient presents with  . Coronary Artery Disease  . Hypertension  . Hyperlipidemia    History of Present Illness:  [name] is a 71 y.o. male with a history of obesity hypoventilation syndrome on home O2, HTN, dyslipidemia, morbid obesity and chronic LE edema which is controlled with TED hose stockings and diuretic. He has chronic diastolic CHF controlled on diuretics. 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  He is still taking Chemo treatments every other week.   He is here today for  followup. He denies any anginal chest pain or pressure.  He has chronic SOB related to his underlying obesity hypoventilation and is on home O2 and thinks his DOE is at baseline. He has chronic LE edema which is stable on compression hose. He denies any palpitations, dizziness or syncope.  No orthopnea or PND.     Past Medical History:  Diagnosis Date  . Asthma    as a child  . Cataract    multiple types, bilateral  . Cholelithiasis without cholecystitis   . Chronic diastolic CHF (congestive heart failure) ([place]) 02/25/2009  . Chronic renal insufficiency, stage III (moderate) 2017   Stage II/III (GFR around 60)  . 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 COPD (chronic obstructive pulmonary disease) ([place])   . Coronary artery disease    chronically occluded LAD and diagonal with right to left collaterals, mild disease in the left circ and moderate disease in the mid RCA on medical management with  Imdur, ASA, and Plavix.  . Dermatitis 05/2014  . DIABETES MELLITUS, TYPE II 06/24/2007   No diab retpthy as of 08/05/15 eye exam.  . 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 Pyrtle--EGD).  CT C/A/P showed metastatic adenopathy in mediastinum and upper abdomen 05/09/16.  Tx plan is palliative radiation (completed 06/22/16), then palliative systemic chemotherapy (carbo+taxol) was started but as of 08/03/16 onc f/u this was held due to severe knee and ankle arthralgias.  Restarting as of 10/2015 (08/2016 CT showed some disease regression  . Esophageal cancer ([place])    CT 12/2016 showed no residual esoph mass--plan to continue chemo  . Essential hypertension 05/01/2007   Qualifier: Diagnosis of  By: Tiney Rouge CMA, Ellison Hughs     . History of kidney stones   . Hyperkalemia 12/2015   Decreased ACE-I by 50% in response, then potassium normalized.  Marland Kitchen HYPERLIPIDEMIA 06/24/2007  . Hypoxemia 01/27/2010  . Myocardial infarction [facility])    pt states he was informed per MD that he has had one but pt was unaware   . OBESITY 09/02/2008  . Obesity hypoventilation syndrome (HCC)    oxygen 24/7 (2 liters [place] as of 02/2016)  . On home oxygen therapy    Oxygen @ 2l/m nasally 24/7 hours  . OSA (obstructive sleep apnea)    not tested; pt scored 4 per stop bang tool results sent to PCP   . OSTEOARTHRITIS 05/01/2007  .  Presbycusis of both ears 05/2015   [facility]  . Pruritic condition 05/2014   Allergist summer 2015, no new testing.  [name] field defect 05/10/16   Per Dr. Melina Fiddler, O.D.: Rt, loss of inf/temp quad and some loss sup/temp.  ?CVA  ? Pituitary tumor? ?Brain met.    Past Surgical History:  Procedure Laterality Date  . APPENDECTOMY    . BALLOON DILATION N/A 05/04/2016   Procedure: BALLOON DILATION;  Surgeon: Jerene Bears, MD;  Location: [facility];  Service: Gastroenterology;  Laterality: N/A;  . CARDIAC CATHETERIZATION    . CARPAL TUNNEL RELEASE Left   . CATARACT EXTRACTION W/PHACO Right  04/29/2013   Procedure: CATARACT EXTRACTION PHACO AND INTRAOCULAR LENS PLACEMENT (IOC);  Surgeon: Adonis Brook, MD;  Location: [facility];  Service: Ophthalmology;  Laterality: Right;  . CATARACT EXTRACTION, BILATERAL    . COLONOSCOPY W/ POLYPECTOMY  08/2011   Many polyps--all hyperplastic, severe diverticulosis, int hem.  [employer] hemoccult testing via [facility] 06/22/15 NEG  . ESOPHAGOGASTRODUODENOSCOPY N/A 05/04/2016   Procedure: ESOPHAGOGASTRODUODENOSCOPY (EGD);  Surgeon: Jerene Bears, MD;  Location: [facility];  Service: Gastroenterology;  Laterality: N/A;  . IR GENERIC HISTORICAL  10/03/2016   IR US GUIDE VASC ACCESS RIGHT 10/03/2016 Greggory Keen, MD [facility]  . IR GENERIC HISTORICAL  10/03/2016   IR FLUORO GUIDE PORT INSERTION RIGHT 10/03/2016 Greggory Keen, MD [facility]  . KNEE ARTHROSCOPY WITH MEDIAL MENISECTOMY Right 12/16/2014   Procedure: RIGHT KNEE ARTHROSCOPY WITH MEDIAL MENISECTOMY microfracture medial femoral condyle abrasion condroplasty medial femoral condyle lateral menisectomy;  Surgeon: Tobi Bastos, MD;  Location: [facility];  Service: Orthopedics;  Laterality: Right;  . LEFT HEART CATHETERIZATION WITH CORONARY ANGIOGRAM N/A 10/26/2013   Procedure: LEFT HEART CATHETERIZATION WITH CORONARY ANGIOGRAM;  Surgeon: Jettie Booze, MD;  Location[facility] C[facility] LAB;  Service: Cardiovascular;  Laterality: N/A;  . LUMBAR [place] SURGERY  2001  . SHOULDER SURGERY Right    right fx  . TONSILLECTOMY    . TRANSTHORACIC ECHOCARDIOGRAM  03/2016   EF 55-60%, grade I DD, LAE  . WRIST SURGERY Right    right fx    Current Medications: Current Meds  Medication Sig  . aspirin 81 MG tablet Take 81 mg by mouth daily.    Marland Kitchen atorvastatin (LIPITOR) 80 MG tablet TAKE ONE TABLET BY MOUTH ONCE DAILY  . beta carotene w/minerals (OCUVITE) tablet Take 1 tablet by mouth daily.  . cetirizine (ZYRTEC) 10 MG tablet Take 10 mg by mouth daily.  . citalopram (CELEXA) 20 MG tablet TAKE ONE TABLET BY MOUTH ONCE DAILY   . clonazePAM (KLONOPIN) 1 MG tablet TAKE ONE TABLET BY MOUTH TWICE DAILY AS NEEDED FOR ANXIETY  . cromolyn (OPTICROM) 4 % ophthalmic solution Place 1 drop into both eyes 4 (four) times daily.  Marland Kitchen gabapentin (NEURONTIN) 300 MG capsule TAKE ONE CAPSULE BY MOUTH THREE TIMES DAILY  . hydrOXYzine (ATARAX/VISTARIL) 25 MG tablet TAKE 1 TABLET BY MOUTH AT SUPPER AND 1 TABLET BY MOUTH AT BEDTIME FOR INSOMNIA  . insulin lispro (HUMALOG KWIKPEN) 100 UNIT/ML KiwkPen Inject into the skin. SS at meal time  . Insulin NPH, Human,, Isophane, (HUMULIN N) 100 UNIT/ML Kiwkpen 16 units every morning and 16 units every evening  . magnesium chloride (SLOW-MAG) 64 MG TBEC SR tablet Take 1 tablet (64 mg total) by mouth daily.  . metFORMIN (GLUCOPHAGE) 1000 MG tablet TAKE ONE TABLET BY MOUTH TWICE DAILY WITH MEALS  . Omega-3 Fatty Acids ([employer] FISH OIL)  1000 MG CAPS Take 2 tablets by mouth 2 (two) times daily.  . ondansetron (ZOFRAN) 8 MG tablet Take 1 tablet (8 mg total) by mouth 2 (two) times daily as needed for refractory nausea / vomiting. Start on day 3 after chemo.  . ONE TOUCH ULTRA TEST test strip   . ONETOUCH DELICA LANCETS 50V MISC   . ONETOUCH DELICA LANCETS 69V MISC USE ONE  TO CHECK GLUCOSE THREE TIMES DAILY  . oxyCODONE (OXY IR/ROXICODONE) 5 MG immediate release tablet Take 1 tablet (5 mg total) by mouth every 8 (eight) hours as needed for severe pain.  . OXYGEN Inhale 2 L/min into the lungs continuous.  . pantoprazole (PROTONIX) 40 MG tablet TAKE ONE TABLET BY MOUTH TWICE DAILY  . prochlorperazine (COMPAZINE) 10 MG tablet Take 1 tablet (10 mg total) by mouth every 6 (six) hours as needed (Nausea or vomiting).  Marland Kitchen ZETIA 10 MG tablet TAKE ONE TABLET BY MOUTH ONCE DAILY    Allergies:   Patient has no known allergies.   Social History   Social History  . Marital status: Married    Spouse name: susan  . Number of children: 2  . Years of education: N/A   Occupational History  . Retired    Social  History Main Topics  . Smoking status: Former Smoker    Packs/day: 1.50    Years: 30.00    Types: Cigarettes, Pipe, Cigars    Quit date: 10/30/1983  . Smokeless tobacco: Former Systems developer    Types: Chew    Quit date: 05/15/2016  . Alcohol use No  . Drug use: No  . Sexual activity: Not Currently   Other Topics Concern  . None   Social History Narrative   Married, one son and one daughter.   His daughter and her two children live with him.   Coffee daily.  Former smoker.  No alcohol.   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   Attends church weekly-   Oxygen continuous        Family History:  The patient's family history includes Alcohol abuse in his father; Aneurysm in his father; Diabetes in his maternal aunt; Heart attack in his mother.   ROS:   Please see the history of present illness.    ROS All other systems reviewed and are negative.  No flowsheet data found.     PHYSICAL EXAM:   VS:  BP 120/70   Pulse (!) 109   Ht '5\' 8"'  (1.727 m)   Wt 272 lb 1.9 oz (123.4 kg)   SpO2 98%   BMI 41.38 kg/m    GEN: Well nourished, well developed, in no acute distress  HEENT: normal  Neck: no JVD, carotid bruits, or masses Cardiac: RRR; no murmurs, rubs, or gallops,no edema.  Intact distal pulses bilaterally.  Respiratory:  clear to auscultation bilaterally, normal work of breathing GI: soft, nontender, nondistended, + BS MS: no deformity or atrophy  Skin: warm and dry, no rash Neuro:  Alert and Oriented x 3, Strength and sensation are intact Psych: euthymic mood, full affect  Wt Readings from Last 3 Encounters:  03/04/17 272 lb 1.9 oz (123.4 kg)  02/06/17 273 lb 14.4 oz (124.2 kg)  01/23/17 272 lb 8 oz (123.6 kg)      Studies/Labs Reviewed:   EKG:  EKG is ordered today.  The ekg ordered today demonstrates sinus tachycardia at 101bpm with RBBB, inferior infarct and anterior infarct  Recent  Labs: 03/15/2016: B Natriuretic Peptide 7.4 08/28/2016:  TSH 2.940 08/31/2016: Magnesium 1.3 02/20/2017: ALT 39; BUN 14.9; Creatinine 0.9; HGB 10.2; Platelets 129; Potassium 4.3; Sodium 138   Lipid Panel    Component Value Date/Time   CHOL 96 (L) 11/28/2016 1113   TRIG 168 (H) 11/28/2016 1113   TRIG 268 (HH) 09/06/2006 1555   HDL 37 (L) 11/28/2016 1113   CHOLHDL 2.6 11/28/2016 1113   CHOLHDL 3.6 03/12/2016 1001   VLDL 37 (H) 03/12/2016 1001   LDLCALC 25 11/28/2016 1113   LDLDIRECT 74.0 04/15/2015 0838    Additional studies/ records that were reviewed today include:  none    ASSESSMENT:    1. Atherosclerosis of native coronary artery of native heart without angina pectoris   2. Chronic diastolic CHF (congestive heart failure) ([place])   3. Essential hypertension   4. Dyslipidemia      PLAN:  In order of problems listed above:  1. ASCAD - chronically occluded LAD and diagonal with right to left collaterals, mild disease in the left circ and moderate disease in the mid RCA on medical management with ASA and Plavix as well as statin.  He has not had any anginal symptoms.   2. Chronic diastolic CHF - he appears euvolemic.  His weight is stable.  He currently is not on diuretics.   3. HTN- BP controlled on diet alone.  4. Dyslipidemia - his LDL goal is < 70.  He will continue on statin and zetia.  His LDL was 25 in January.    Medication Adjustments/Labs and Tests Ordered: Current medicines are reviewed at length with the patient today.  Concerns regarding medicines are outlined above.  Medication changes, Labs and Tests ordered today are listed in the Patient Instructions below.  There are no Patient Instructions on file for this visit.   Signed, Fransico Him, MD  03/04/2017 8:09 AM    [facility] [place], [place], [place]  [address] Phone: [phone]; Fax: [phone]

## 2017-03-04 ENCOUNTER — Encounter (INDEPENDENT_AMBULATORY_CARE_PROVIDER_SITE_OTHER): Payer: Self-pay

## 2017-03-04 ENCOUNTER — Ambulatory Visit (INDEPENDENT_AMBULATORY_CARE_PROVIDER_SITE_OTHER): Payer: Medicare Other | Admitting: Cardiology

## 2017-03-04 ENCOUNTER — Encounter: Payer: Self-pay | Admitting: Cardiology

## 2017-03-04 ENCOUNTER — Telehealth: Payer: Self-pay

## 2017-03-04 VITALS — BP 120/70 | HR 109 | Ht 68.0 in | Wt 272.1 lb

## 2017-03-04 DIAGNOSIS — I10 Essential (primary) hypertension: Secondary | ICD-10-CM | POA: Diagnosis not present

## 2017-03-04 DIAGNOSIS — I5032 Chronic diastolic (congestive) heart failure: Secondary | ICD-10-CM

## 2017-03-04 DIAGNOSIS — I2510 Atherosclerotic heart disease of native coronary artery without angina pectoris: Secondary | ICD-10-CM | POA: Diagnosis not present

## 2017-03-04 DIAGNOSIS — E785 Hyperlipidemia, unspecified: Secondary | ICD-10-CM

## 2017-03-04 NOTE — Progress Notes (Signed)
[place]  [phone] Fax:([phone]  Clinic Follow up Note   Patient Care Team: [name] Sou, MD as PCP - General (Family Medicine) [name], [name] Grinder, MD as Consulting Physician (Allergy and Immunology) [name] [name] Freud, MD as Consulting Physician (Optometry) [name] Margarita, MD as Consulting Physician (Cardiology) [name], [name] Lines, MD as Consulting Physician (Gastroenterology) [name] Maudlin, MD as Consulting Physician (Orthopedic Surgery) [name] Marble, MD as Consulting Physician (Otolaryngology) [name] Merle, MD as Consulting Physician (Hematology) [name] Rudd, MD as Consulting Physician (Radiation Oncology) 03/06/2017    CHIEF COMPLAINTS:  Follow up GE junction cancer  Oncology History   Cancer of cardio-esophageal junction [facility])   Staging form: Stomach, AJCC 7th Edition     Clinical stage from 05/04/2016: Stage IV ([place], N2, M1) - Signed by [name] Merle, MD on 05/18/2016        Cancer of cardio-esophageal junction ([place])   05/04/2016 Initial Diagnosis    Cancer of cardio-esophageal junction ([place])      05/04/2016 Procedure    EGD showed a large ulcerating mass with no active bleeding at the gastroesophageal junction extending into the gastric cardia. The mass was not obstructing and partially circumferential, extends approximately 5 cm. Nonbleeding erosive gastropathy.      05/04/2016 Initial Biopsy    Esophageal gastric junction biopsy showed a poorly differentiated carcinoma underlying the squamous mucosa. There is lymphovascular invasion. No Intestinal metaplasia. IHC weakly positive CK5/6, p63 (-), favor squamous.        05/09/2016 Imaging    CT CAP w contrast showed mild wall thickening involving the distal esophagus and proximal stomach compatible with known cancer, enlarged mediastinal and upper abdominal lymph nodes are highly suspicious for metastatic adenopathy, propable liver cirrhosis.      06/04/2016 - 06/22/2016 Radiation Therapy     palliative radiation to esophageal cancer       07/06/2016 -  Chemotherapy    chemotherapy with weekly carboplatin and taxol, started on 07/06/2016, held 08/21/16-09/28/2016 due to hospitalization, changed to every 2 weeks from 11/30/2016      07/30/2016 Pathology Results    PD-L1 negative expression      08/28/2016 - 08/31/2016 Hospital Admission    The patient was admitted to 4 severe hypoglycemia, dehydration, and acute renal failure. Infection workup was negative. He recovered well with supportive care. His insulin and hypertension medication was held on discharge, except insulin sliding scale.      09/24/2016 Imaging    CT CAP W CONTRAST 09/26/2016 IMPRESSION: Decreased masslike soft tissue prominence at gastroesophageal junction, consistent with decreased size of primary gastroesophageal junction carcinoma. Resolution of paraesophageal and gastrohepatic ligament lymphadenopathy since prior exam. Other sub-cm mediastinal lymph nodes show little or no significant change. Stable indeterminate sub-cm low-attenuation lesion in the liver dome. Probable hepatic cirrhosis. Recommend continued attention on follow-up CT. No new or progressive metastatic disease identified. Cholelithiasis.  No radiographic evidence of cholecystitis. Colonic diverticulosis. No radiographic evidence of diverticulitis. Aortic atherosclerosis and three-vessel coronary artery calcification.      01/07/2017 Imaging    CT CAP w contrast 1. No residual esophageal mass or adenopathy. 2. New nodular airspace opacification in the left lower lobe. Continued attention on followup exams is warranted as metastatic disease cannot be definitively excluded. 3. Cirrhosis. 4. Trace left pleural effusion. 5. Aortic atherosclerosis (ICD10-170.0). Coronary artery calcification. 6. Cholelithiasis. 7. Mild basilar predominant subpleural reticular densities. Difficult to exclude interstitial lung disease.       HISTORY OF  PRESENTING  ILLNESS:  [name] 71 y.o. male is here because of His reason that diagnosed GE junction carcinoma. He is a comment of by his wife and daughter to our multidisciplinary chart clinic today.  He has been having progressive dysphagia and odynophagia for 2 month, he has been eating soft diet only in the past one week. He has pain in the mid chest and epigastric area only when he eats, with burning sensation, no nausea, abdominal bloating, or other discomfort. His appetite has remained well, no weight loss,   He has multiple medical problems, especially heart failure, he has been on oxygen continuously for 5-6 years. He is morbidly obese, has diabetes, hypertension, mild neuropathy, OSA etc. He has very sedentary life style, he sits in the chair and watch TV most of time during the day. He only comes out for shopping for a few times a week. He uses a Barr[employer], he can walk for a few hundrends feet before he has to stop to catch his breasts. He denies cough or sputum production, no GI discomfort, he has been having mild dirrhea, loose stool, twice daily, no melena or hematochezia.  CURRENT THERAPY: chemotherapy with weekly carboplatin and taxol, started on 07/06/2016, held 08/21/16-09/28/2016 due to hospitalization, changed to every 2 weeks from 11/30/2016  INTERIM HISTORY:  [name] returns for follow-up and treatment. He presents to the clinic today with his son. He is doing well overall, has mild knee pain which is chronic and stable. He had mild nausea after chemotherapy, resolved. His appetite remains to be well, no dysphasia, he is well. No other new changes. He denies any cough, mild dyspnea on exertion.    MEDICAL HISTORY:  Past Medical History:  Diagnosis Date  . Asthma    as a child  . Cataract    multiple types, bilateral  . Cholelithiasis without cholecystitis   . Chronic diastolic CHF (congestive heart failure) ([place]) 02/25/2009  . Chronic renal insufficiency, stage  III (moderate) 2017   Stage II/III (GFR around 60)  . 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 COPD (chronic obstructive pulmonary disease) ([place])   . Coronary artery disease    chronically occluded LAD and diagonal with right to left collaterals, mild disease in the left circ and moderate disease in the mid RCA on medical management with Imdur, ASA, and Plavix.  . Dermatitis 05/2014  . DIABETES MELLITUS, TYPE II 06/24/2007   No diab retpthy as of 08/05/15 eye exam.  . Esophageal cancer ([place]) 04/2016   poorly differentiated carcinoma (Dr. Pyrtle--EGD).  CT C/A/P showed metastatic adenopathy in mediastinum and upper abdomen 05/09/16.  Tx plan is palliative radiation (completed 06/22/16), then palliative systemic chemotherapy (carbo+taxol) was started but as of 08/03/16 onc f/u this was held due to severe knee and ankle arthralgias.  Restarting as of 10/2015 (08/2016 CT showed some disease regression  . Esophageal cancer ([place])    CT 12/2016 showed no residual esoph mass--plan to continue chemo  . Essential hypertension 05/01/2007   Qualifier: Diagnosis of  By: Tiney Rouge CMA, Ellison Hughs     . History of kidney stones   . Hyperkalemia 12/2015   Decreased ACE-I by 50% in response, then potassium normalized.  Marland Kitchen HYPERLIPIDEMIA 06/24/2007  . Hypoxemia 01/27/2010  . Myocardial infarction [facility])    pt states he was informed per MD that he has had one but pt was unaware   .  OBESITY 09/02/2008  . Obesity hypoventilation syndrome (HCC)    oxygen 24/7 (2 liters [place] as of 02/2016)  . On home oxygen therapy    Oxygen @ 2l/m nasally 24/7 hours  . OSA (obstructive sleep apnea)    not tested; pt scored 4 per stop bang tool results sent to PCP   . OSTEOARTHRITIS 05/01/2007  . Presbycusis of both ears 05/2015   [facility]  . Pruritic condition 05/2014   Allergist summer 2015, no new testing.  [name] field defect 05/10/16   Per Dr. Melina Fiddler, O.D.: Rt, loss of inf/temp  quad and some loss sup/temp.  ?CVA  ? Pituitary tumor? ?Brain met.    SURGICAL HISTORY: Past Surgical History:  Procedure Laterality Date  . APPENDECTOMY    . BALLOON DILATION N/A 05/04/2016   Procedure: BALLOON DILATION;  Surgeon: Jerene Bears, MD;  Location: [facility];  Service: Gastroenterology;  Laterality: N/A;  . CARDIAC CATHETERIZATION    . CARPAL TUNNEL RELEASE Left   . CATARACT EXTRACTION W/PHACO Right 04/29/2013   Procedure: CATARACT EXTRACTION PHACO AND INTRAOCULAR LENS PLACEMENT (IOC);  Surgeon: Adonis Brook, MD;  Location: [facility];  Service: Ophthalmology;  Laterality: Right;  . CATARACT EXTRACTION, BILATERAL    . COLONOSCOPY W/ POLYPECTOMY  08/2011   Many polyps--all hyperplastic, severe diverticulosis, int hem.  [employer] hemoccult testing via [facility] 06/22/15 NEG  . ESOPHAGOGASTRODUODENOSCOPY N/A 05/04/2016   Procedure: ESOPHAGOGASTRODUODENOSCOPY (EGD);  Surgeon: Jerene Bears, MD;  Location: [facility];  Service: Gastroenterology;  Laterality: N/A;  . IR GENERIC HISTORICAL  10/03/2016   IR US GUIDE VASC ACCESS RIGHT 10/03/2016 Greggory Keen, MD [facility]  . IR GENERIC HISTORICAL  10/03/2016   IR FLUORO GUIDE PORT INSERTION RIGHT 10/03/2016 Greggory Keen, MD [facility]  . KNEE ARTHROSCOPY WITH MEDIAL MENISECTOMY Right 12/16/2014   Procedure: RIGHT KNEE ARTHROSCOPY WITH MEDIAL MENISECTOMY microfracture medial femoral condyle abrasion condroplasty medial femoral condyle lateral menisectomy;  Surgeon: Tobi Bastos, MD;  Location: [facility];  Service: Orthopedics;  Laterality: Right;  . LEFT HEART CATHETERIZATION WITH CORONARY ANGIOGRAM N/A 10/26/2013   Procedure: LEFT HEART CATHETERIZATION WITH CORONARY ANGIOGRAM;  Surgeon: Jettie Booze, MD;  Location: [facility] CATH LAB;  Service: Cardiovascular;  Laterality: N/A;  . LUMBAR [facility]  2001  . SHOULDER SURGERY Right    right fx  . TONSILLECTOMY    . TRANSTHORACIC ECHOCARDIOGRAM  03/2016   EF 55-60%, grade I DD, LAE  . WRIST  SURGERY Right    right fx    SOCIAL HISTORY: Social History   Social History  . Marital status: Married    Spouse name: susan  . Number of children: 2  . Years of education: N/A   Occupational History  . Retired    Social History Main Topics  . Smoking status: Former Smoker    Packs/day: 1.50    Years: 30.00    Types: Cigarettes, Pipe, Cigars    Quit date: 10/30/1983  . Smokeless tobacco: Former Systems developer    Types: Chew    Quit date: 05/15/2016  . Alcohol use No  . Drug use: No  . Sexual activity: Not Currently   Other Topics Concern  . Not on file   Social History Narrative   Married, one son and one daughter.   His daughter and her two children live with him.   Coffee daily.  Former smoker.  No alcohol.   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   Attends church weekly-   Oxygen continuous       FAMILY HISTORY: Family History  Problem Relation Age of Onset  . Heart attack Mother   . Aneurysm Father   . Alcohol abuse Father   . Diabetes Maternal Aunt   . Colon cancer Neg Hx     ALLERGIES:  has No Known Allergies.  MEDICATIONS:  Current Outpatient Prescriptions  Medication Sig Dispense Refill  . aspirin 81 MG tablet Take 81 mg by mouth daily.      Marland Kitchen atorvastatin (LIPITOR) 80 MG tablet TAKE ONE TABLET BY MOUTH ONCE DAILY 90 tablet 2  . beta carotene w/minerals (OCUVITE) tablet Take 1 tablet by mouth daily.    . cetirizine (ZYRTEC) 10 MG tablet Take 10 mg by mouth daily.    . citalopram (CELEXA) 20 MG tablet TAKE ONE TABLET BY MOUTH ONCE DAILY 90 tablet 1  . clonazePAM (KLONOPIN) 1 MG tablet TAKE ONE TABLET BY MOUTH TWICE DAILY AS NEEDED FOR ANXIETY 60 tablet 5  . cromolyn (OPTICROM) 4 % ophthalmic solution Place 1 drop into both eyes 4 (four) times daily.    Marland Kitchen gabapentin (NEURONTIN) 300 MG capsule TAKE ONE CAPSULE BY MOUTH THREE TIMES DAILY 270 capsule 3  . HUMALOG KWIKPEN 200 UNIT/ML SOPN INJECT 24 UNITS SUBCUTANEOUSLY THREE TIMES  DAILY BEFORE MEAL(S) 18 pen 6  . hydrOXYzine (ATARAX/VISTARIL) 25 MG tablet TAKE 1 TABLET BY MOUTH AT SUPPER AND 1 TABLET BY MOUTH AT BEDTIME FOR INSOMNIA 60 tablet 6  . insulin lispro (HUMALOG KWIKPEN) 100 UNIT/ML KiwkPen Inject into the skin. SS at meal time    . Insulin NPH, Human,, Isophane, (HUMULIN N) 100 UNIT/ML Kiwkpen 16 units every morning and 16 units every evening 15 mL 3  . magnesium chloride (SLOW-MAG) 64 MG TBEC SR tablet Take 1 tablet (64 mg total) by mouth daily. 60 tablet 0  . metFORMIN (GLUCOPHAGE) 1000 MG tablet TAKE ONE TABLET BY MOUTH TWICE DAILY WITH MEALS 180 tablet 1  . Omega-3 Fatty Acids ([employer] FISH OIL) 1000 MG CAPS Take 2 tablets by mouth 2 (two) times daily. 90 capsule   . ondansetron (ZOFRAN) 8 MG tablet Take 1 tablet (8 mg total) by mouth 2 (two) times daily as needed for refractory nausea / vomiting. Start on day 3 after chemo. 30 tablet 1  . ONE TOUCH ULTRA TEST test strip     . ONETOUCH DELICA LANCETS 79G MISC USE ONE  TO CHECK GLUCOSE THREE TIMES DAILY 100 each 11  . oxyCODONE (OXY IR/ROXICODONE) 5 MG immediate release tablet Take 1 tablet (5 mg total) by mouth every 8 (eight) hours as needed for severe pain. 30 tablet 0  . OXYGEN Inhale 2 L/min into the lungs continuous.    . pantoprazole (PROTONIX) 40 MG tablet TAKE ONE TABLET BY MOUTH TWICE DAILY 60 tablet 3  . prochlorperazine (COMPAZINE) 10 MG tablet Take 1 tablet (10 mg total) by mouth every 6 (six) hours as needed (Nausea or vomiting). 30 tablet 1  . ZETIA 10 MG tablet TAKE ONE TABLET BY MOUTH ONCE DAILY 90 tablet 1   No current facility-administered medications for this visit.     REVIEW OF SYSTEMS:   Constitutional: Denies fevers, abnormal night sweats,  Eyes: Denies blurriness of vision, double vision or watery eyes Ears, nose, mouth, throat, and face: Denies mucositis or sore throat, (+) trouble swallowing Respiratory: Denies cough, dyspnea or wheezes Cardiovascular: Denies palpitation, chest  discomfort or lower extremity swelling Gastrointestinal:  Denies nausea, heartburn or change in bowel habits (+) diarrhea Skin: Denies abnormal skin rashes Lymphatics: Denies new lymphadenopathy or easy bruising Neurological:Denies numbness, tingling or new weaknesses  Behavioral/Psych: Mood is stable, no new changes  All other systems were reviewed with the patient and are negative.  PHYSICAL EXAMINATION: ECOG PERFORMANCE STATUS: 3 - Symptomatic, >50% confined to bed  Vitals:   03/06/17 1213  BP: 121/63  Pulse: 82  Resp: 18  Temp: 98.4 F (36.9 C)   Filed Weights   03/06/17 1213  Weight: 275 lb 4.8 oz (124.9 kg)   GENERAL:alert, no distress and comfortable, morbid obese, sitting in wheelchair with nasal cannula oxygen  SKIN: skin color, texture, turgor are normal, no rashes or significant lesions EYES: normal, conjunctiva are pink and non-injected, sclera clear OROPHARYNX:no exudate, no erythema and lips, buccal mucosa, and tongue normal  NECK: supple, thyroid normal size, non-tender, without nodularity LYMPH:  no palpable lymphadenopathy in the cervical, axillary or inguinal LUNGS: clear, (+) rales on bilateral lung base HEART: regular rate & rhythm and no murmurs and no lower extremity edema ABDOMEN:abdomen soft, non-tender and normal bowel sounds Musculoskeletal:no cyanosis of digits and no clubbing  PSYCH: alert & oriented x 3 with fluent speech NEURO: no focal motor/sensory deficits EXT: Trace edema, he wears compression stocks, no significant ankle swollen, erythema, or warmness.   LABORATORY DATA:  I have reviewed the data as listed CBC Latest Ref Rng & Units 03/06/2017 02/20/2017 02/06/2017  WBC 4.0 - 10.3 10e3/uL 2.7(L) 2.9(L) 3.0(L)  Hemoglobin 13.0 - 17.1 g/dL 10.0(L) 10.2(L) 10.4(L)  Hematocrit 38.4 - 49.9 % 32.5(L) 31.4(L) 32.3(L)  Platelets 140 - 400 10e3/uL 88(L) 129(L) 142   CMP Latest Ref Rng & Units 03/06/2017 02/20/2017 02/06/2017  Glucose 70 - 140 mg/dl  143(H) 120 139  BUN 7.0 - 26.0 mg/dL 11.2 14.9 11.5  Creatinine 0.7 - 1.3 mg/dL 0.8 0.9 0.8  Sodium 136 - 145 mEq/L 138 138 140  Potassium 3.5 - 5.1 mEq/L 4.2 4.3 3.9  Chloride 96 - 106 mmol/L - - -  CO2 22 - 29 mEq/L '28 27 26  ' Calcium 8.4 - 10.4 mg/dL 8.6 9.1 8.9  Total Protein 6.4 - 8.3 g/dL 6.2(L) 6.3(L) 6.3(L)  Total Bilirubin 0.20 - 1.20 mg/dL 0.68 0.70 0.65  Alkaline Phos 40 - 150 U/L 62 65 63  AST 5 - 34 U/L 37(H) 41(H) 28  ALT 0 - 55 U/L 37 39 25   ANC 1.5 TODAY   PATHOLOGY REPORT Diagnosis 05/04/2016 1. Esophagogastric junction, biopsy, ulcerative mass - POORLY DIFFERENTIATED CARCINOMA, SEE COMMENT. 2. Stomach, biopsy - REACTIVE GASTROPATHY. - NEGATIVE FOR HELICOBACTER PYLORI. - NO INTESTINAL METAPLASIA, DYSPLASIA, OR MALIGNANCY. Microscopic Comment 1. The majority of the specimen consists of gastroesophageal mucosa with reflux changes. There is a small focus of tumor underlying the squamous mucosa. There is lymphovascular invasion. There is no background intestinal metaplasia (Barrett's esophagus). Immunohistochemistry reveals only very focal weak cytokeratin 5/6, negative p63, and negative mucicarmine in the limited remaining tumor. Dr. Saralyn Pilar has reviewed the case. The case was called to Dr. Hilarie Fredrickson on 05/08/2016. 2. A Warthin-Starry stain is performed to determine the possibility of the presence of Helicobacter pylori. The Warthin-Starry stain is negative for organisms of Helicobacter pylori.     RADIOGRAPHIC STUDIES: I have personally reviewed the radiological images as listed and agreed with the findings in the report.  CT CAP w contrast 01/07/2017 IMPRESSION: 1. No residual esophageal mass or adenopathy. 2. New nodular airspace opacification  in the left lower lobe. Continued attention on followup exams is warranted as metastatic disease cannot be definitively excluded. 3. Cirrhosis. 4. Trace left pleural effusion. 5. Aortic atherosclerosis (ICD10-170.0).  Coronary artery calcification. 6. Cholelithiasis. 7. Mild basilar predominant subpleural reticular densities. Difficult to exclude interstitial lung disease.  EGD 05/04/2016 A large, ulcerating mass with no active bleeding and no stigmata of recent bleeding was found at the gastroesophageal junction extending into the gastric cardia, beginning 40 cm from the incisors. The mass was non-obstructing and partially circumferential (involving one-half of the lumen circumference). The mass extends approximately 5 cm. IMPRESSION:  -Likely malignant esophageal tumor was found at the gastroesophageal junction extending into the gastric cardia. Biopsied. - Non-bleeding erosive gastropathy. Biopsied. - Erythematous duodenopathy. - Normal second portion of the duodenum.  ASSESSMENT & PLAN: 71 y.o. Caucasian male, with multiple comorbidities, including hypertension, diabetes, OSA, coronary artery disease, diastolic CHF, on continuous oxygen, morbid obesity, presented with progressive dysphagia and odynophagia.  1. Cancer of cardio-esophageal junction, poorly differentiated, cTxN2M1 with node metastasis, stage IV  -I previously reviewed his CT scan, EGD, and the biopsy results with patient and his family members in great details. -His biopsy pathology was previously reviewed in our tumor board, the morphology and weakly positive for CK 5/6, fevers squamous cell carcinoma -His CT scan findings are very concerning for distant metastasis to abdominal lymph nodes.  -I previously reviewed the PET scan images with patient and his daughter in person, he has hypermetabolic GE junction tumor, and diffuse adenopathy in mediastinum and upper abdomen. -We previously discussed the aggressive nature of esophageal cancer, an incurable nature of his disease due to the distant metastasis. The goal of therapy is palliative, to prolong his life and palliate his symptoms. -His tumor was negative for PD-L1, not a candidate for  immunotherapy  -He is on first line palliative chemotherapy with weekly carbo and Taxol, every 2 weeks, tolerating low dose better lately. -I previously reviewed his CT scan findings on 09/26/2016 show good partial response to chemoradiation, no new or progressive metastatic disease. - I previously  reviewed his restaging CT scan from 01/07/2017,  Which showed no residual esophageal mass or adenopathy, he has mild new infiltrative change in LLL, possible related to aspiration, he is asymptomatic, will continue monitoring  -He is clinically doing well, tolerating chemotherapy well, lab results reviewed with patient, mild pancytopenia, secondary to chemotherapy, adequate for treatment, we'll continue low-dose carboplatin and Taxol every 2 weeks. Due to the thrombocytopenia, I'll decrease her carboplatin to AUC 1 today. -plan to repeat CT scan in [facility] June  2. CAD, diastolic CHF, on continuous oxygen -He will continue follow-up with his cardiologist Dr. Radford Pax -His Plavix has been held since the EGD and biopsy, he is on baby aspirin. -Need to watch his fluid intake during his chemotherapy, and also avoid fluid overload from chemo  -he is clinically stable  3. DM, HTN -We previously discussed his blood pressure and glucose need to be monitored closely during the chemotherapy, which may affect his blood glucose and blood pressure -He will continue medication, monitor his sugar and blood pressure at home, and follow-up with his primary care physician. -will decrease premed dexa before taxol  -History simple has been held due to his borderline low blood pressure -He will see his primary care physician Dr. Anitra Lauth for diabetes management.  4. OSA, morbid obesity -We previously discussed healthy diet, I encouraged him to to be more physically active, and consider home PT -previously I  strongly encouraged him to follow-up with dietitian during the therapy for nutrition support.  5. Arthralgia -He has  baseline osteoarthritis, not physically active -However his arthralgia in his knees and ankle are much worse after chemotherapy but resolved now  -He will use oxycodone 5 mg as needed, constipation management previously reviewed with him. -Leg weakness improved with physical therapy. Previously encouraged the patient to continue with physical therapy.   6. Pancytopenia  -Second is to underlying malignancy and chemotherapy -Mild anemia is stable, his hemoglobin is 10.0 today, thrombocytopenia worse, platelet 88K today, we'll reduce chemotherapy dose -we'll consider blood transfusion if hemoglobin less than 8, for symptomatic anemia.  7. Goal of care discussion  -We previously discussed the incurable nature of his cancer, and the overall poor prognosis, especially if he does not have good response to chemotherapy or progress on chemo -The patient understands the goal of care is palliative. -Patient is very realistic, understands his cancer is not curable, and he agrees with DO NOT RESUSCITATE and DO NOT INTUBATE   PLAN -Lab reviewed, adequate for treatment, due today moderate some cytopenia, I'll reduce carboplatin AUC to 1.0, continue Taxol 70 mg/m, continue chemotherapy every 2 weeks -I will see him back in 4 weeks, we'll order restaging CT scans on his next visit.  -Scan and lab results reviewed with patient. ANC 1.8, platelet 142K, adequate for treatment. He will receive low dose chemo carboplatin and Taxol treatment today, and continue every 2 weeks. -Move all appointments on 02/13/17 to 02/20/17. -Lab, flush, f/u, and chemo Carbo and Taxol in 4 weeks. -repeat CT scan in early June   All questions were answered. The patient knows to call the clinic with any problems, questions or concerns.  I spent 20 minutes counseling the patient face to face. The total time spent in the appointment was 25 minutes and more than 35% was on counseling.    [name] Merle, MD 03/06/2017

## 2017-03-04 NOTE — Patient Instructions (Signed)
Medication Instructions:  I will call you later today (once your pharmacy opens) to discuss your Plavix.  Labwork: None  Testing/Procedures: None  Follow-Up: Your physician wants you to follow-up in: 6 months with Dr. Theodosia Blender assistant. You will receive a reminder letter in the mail two months in advance. If you don't receive a letter, please call our office to schedule the follow-up appointment.   Your physician wants you to follow-up in: 1 year with Dr. Radford Pax. You will receive a reminder letter in the mail two months in advance. If you don't receive a letter, please call our office to schedule the follow-up appointment.   Any Other Special Instructions Will Be Listed Below (If Applicable).     If you need a refill on your cardiac medications before your next appointment, please call your pharmacy.

## 2017-03-04 NOTE — Telephone Encounter (Signed)
ok 

## 2017-03-04 NOTE — Telephone Encounter (Signed)
Confirmed with patient's pharmacy that he has not picked up Plavix 75 mg since October, 2017. He states was was never instructed to restart the Plavix after his cancer diagnosis last summer. He states he thinks Dr. Burr Medico told him not to start again unless instructed.  To Dr. Radford Pax for review.

## 2017-03-06 ENCOUNTER — Ambulatory Visit (HOSPITAL_BASED_OUTPATIENT_CLINIC_OR_DEPARTMENT_OTHER): Payer: Medicare Other | Admitting: Hematology

## 2017-03-06 ENCOUNTER — Other Ambulatory Visit (HOSPITAL_BASED_OUTPATIENT_CLINIC_OR_DEPARTMENT_OTHER): Payer: Medicare Other

## 2017-03-06 ENCOUNTER — Telehealth: Payer: Self-pay | Admitting: Hematology

## 2017-03-06 ENCOUNTER — Ambulatory Visit (HOSPITAL_BASED_OUTPATIENT_CLINIC_OR_DEPARTMENT_OTHER): Payer: Medicare Other

## 2017-03-06 ENCOUNTER — Ambulatory Visit: Payer: Medicare Other

## 2017-03-06 VITALS — BP 121/63 | HR 82 | Temp 98.4°F | Resp 18

## 2017-03-06 VITALS — BP 121/63 | HR 82 | Temp 98.4°F | Resp 18 | Ht 68.0 in | Wt 275.3 lb

## 2017-03-06 DIAGNOSIS — D61810 Antineoplastic chemotherapy induced pancytopenia: Secondary | ICD-10-CM

## 2017-03-06 DIAGNOSIS — I10 Essential (primary) hypertension: Secondary | ICD-10-CM | POA: Diagnosis not present

## 2017-03-06 DIAGNOSIS — I5032 Chronic diastolic (congestive) heart failure: Secondary | ICD-10-CM

## 2017-03-06 DIAGNOSIS — E119 Type 2 diabetes mellitus without complications: Secondary | ICD-10-CM | POA: Diagnosis not present

## 2017-03-06 DIAGNOSIS — C160 Malignant neoplasm of cardia: Secondary | ICD-10-CM

## 2017-03-06 DIAGNOSIS — Z5111 Encounter for antineoplastic chemotherapy: Secondary | ICD-10-CM

## 2017-03-06 DIAGNOSIS — Z95828 Presence of other vascular implants and grafts: Secondary | ICD-10-CM

## 2017-03-06 LAB — CBC WITH DIFFERENTIAL/PLATELET
BASO%: 0.7 % (ref 0.0–2.0)
Basophils Absolute: 0 10*3/uL (ref 0.0–0.1)
EOS ABS: 0.1 10*3/uL (ref 0.0–0.5)
EOS%: 2.6 % (ref 0.0–7.0)
HCT: 32.5 % — ABNORMAL LOW (ref 38.4–49.9)
HGB: 10 g/dL — ABNORMAL LOW (ref 13.0–17.1)
LYMPH%: 22.6 % (ref 14.0–49.0)
MCH: 29.7 pg (ref 27.2–33.4)
MCHC: 30.8 g/dL — AB (ref 32.0–36.0)
MCV: 96.4 fL (ref 79.3–98.0)
MONO#: 0.5 10*3/uL (ref 0.1–0.9)
MONO%: 18.1 % — AB (ref 0.0–14.0)
NEUT#: 1.5 10*3/uL (ref 1.5–6.5)
NEUT%: 56 % (ref 39.0–75.0)
PLATELETS: 88 10*3/uL — AB (ref 140–400)
RBC: 3.37 10*6/uL — AB (ref 4.20–5.82)
RDW: 17.7 % — ABNORMAL HIGH (ref 11.0–14.6)
WBC: 2.7 10*3/uL — AB (ref 4.0–10.3)
lymph#: 0.6 10*3/uL — ABNORMAL LOW (ref 0.9–3.3)

## 2017-03-06 LAB — COMPREHENSIVE METABOLIC PANEL
ALT: 37 U/L (ref 0–55)
ANION GAP: 6 meq/L (ref 3–11)
AST: 37 U/L — ABNORMAL HIGH (ref 5–34)
Albumin: 3.2 g/dL — ABNORMAL LOW (ref 3.5–5.0)
Alkaline Phosphatase: 62 U/L (ref 40–150)
BILIRUBIN TOTAL: 0.68 mg/dL (ref 0.20–1.20)
BUN: 11.2 mg/dL (ref 7.0–26.0)
CHLORIDE: 104 meq/L (ref 98–109)
CO2: 28 mEq/L (ref 22–29)
Calcium: 8.6 mg/dL (ref 8.4–10.4)
Creatinine: 0.8 mg/dL (ref 0.7–1.3)
EGFR: 89 mL/min/{1.73_m2} — AB (ref >90)
GLUCOSE: 143 mg/dL — AB (ref 70–140)
POTASSIUM: 4.2 meq/L (ref 3.5–5.1)
SODIUM: 138 meq/L (ref 136–145)
TOTAL PROTEIN: 6.2 g/dL — AB (ref 6.4–8.3)

## 2017-03-06 MED ORDER — SODIUM CHLORIDE 0.9 % IV SOLN
150.0000 mg | Freq: Once | INTRAVENOUS | Status: AC
  Administered 2017-03-06: 150 mg via INTRAVENOUS
  Filled 2017-03-06: qty 15

## 2017-03-06 MED ORDER — PACLITAXEL CHEMO INJECTION 300 MG/50ML
70.0000 mg/m2 | Freq: Once | INTRAVENOUS | Status: AC
  Administered 2017-03-06: 180 mg via INTRAVENOUS
  Filled 2017-03-06: qty 30

## 2017-03-06 MED ORDER — DIPHENHYDRAMINE HCL 50 MG/ML IJ SOLN
INTRAMUSCULAR | Status: AC
  Filled 2017-03-06: qty 1

## 2017-03-06 MED ORDER — DEXAMETHASONE SODIUM PHOSPHATE 10 MG/ML IJ SOLN
10.0000 mg | Freq: Once | INTRAMUSCULAR | Status: AC
  Administered 2017-03-06: 10 mg via INTRAVENOUS

## 2017-03-06 MED ORDER — PALONOSETRON HCL INJECTION 0.25 MG/5ML
0.2500 mg | Freq: Once | INTRAVENOUS | Status: AC
  Administered 2017-03-06: 0.25 mg via INTRAVENOUS

## 2017-03-06 MED ORDER — FAMOTIDINE IN NACL 20-0.9 MG/50ML-% IV SOLN
INTRAVENOUS | Status: AC
  Filled 2017-03-06: qty 50

## 2017-03-06 MED ORDER — HEPARIN SOD (PORK) LOCK FLUSH 100 UNIT/ML IV SOLN
500.0000 [IU] | Freq: Once | INTRAVENOUS | Status: AC | PRN
  Administered 2017-03-06: 500 [IU]
  Filled 2017-03-06: qty 5

## 2017-03-06 MED ORDER — FAMOTIDINE IN NACL 20-0.9 MG/50ML-% IV SOLN
20.0000 mg | Freq: Once | INTRAVENOUS | Status: AC
  Administered 2017-03-06: 20 mg via INTRAVENOUS

## 2017-03-06 MED ORDER — HEPARIN SOD (PORK) LOCK FLUSH 100 UNIT/ML IV SOLN
500.0000 [IU] | Freq: Once | INTRAVENOUS | Status: AC | PRN
  Administered 2017-03-06: 500 [IU] via INTRAVENOUS
  Filled 2017-03-06: qty 5

## 2017-03-06 MED ORDER — PALONOSETRON HCL INJECTION 0.25 MG/5ML
INTRAVENOUS | Status: AC
  Filled 2017-03-06: qty 5

## 2017-03-06 MED ORDER — SODIUM CHLORIDE 0.9% FLUSH
10.0000 mL | INTRAVENOUS | Status: DC | PRN
  Administered 2017-03-06: 10 mL via INTRAVENOUS
  Filled 2017-03-06: qty 10

## 2017-03-06 MED ORDER — DIPHENHYDRAMINE HCL 50 MG/ML IJ SOLN
25.0000 mg | Freq: Once | INTRAMUSCULAR | Status: AC
  Administered 2017-03-06: 25 mg via INTRAVENOUS

## 2017-03-06 MED ORDER — SODIUM CHLORIDE 0.9% FLUSH
10.0000 mL | INTRAVENOUS | Status: DC | PRN
  Administered 2017-03-06: 10 mL
  Filled 2017-03-06: qty 10

## 2017-03-06 MED ORDER — SODIUM CHLORIDE 0.9 % IV SOLN
Freq: Once | INTRAVENOUS | Status: AC
  Administered 2017-03-06: 15:00:00 via INTRAVENOUS

## 2017-03-06 MED ORDER — DEXAMETHASONE SODIUM PHOSPHATE 10 MG/ML IJ SOLN
INTRAMUSCULAR | Status: AC
  Filled 2017-03-06: qty 1

## 2017-03-06 NOTE — Patient Instructions (Signed)
[facility] Discharge Instructions for Patients Receiving Chemotherapy  Today you received the following chemotherapy agents Taxol and Carboplatin  To help prevent nausea and vomiting after your treatment, we encourage you to take your nausea medication as directed. No Zofran for 3 days. Take Compazine instead.   If you develop nausea and vomiting that is not controlled by your nausea medication, call the clinic.   BELOW ARE SYMPTOMS THAT SHOULD BE REPORTED IMMEDIATELY:  *FEVER GREATER THAN 100.5 F  *CHILLS WITH OR WITHOUT FEVER  NAUSEA AND VOMITING THAT IS NOT CONTROLLED WITH YOUR NAUSEA MEDICATION  *UNUSUAL SHORTNESS OF BREATH  *UNUSUAL BRUISING OR BLEEDING  TENDERNESS IN MOUTH AND THROAT WITH OR WITHOUT PRESENCE OF ULCERS  *URINARY PROBLEMS  *BOWEL PROBLEMS  UNUSUAL RASH Items with * indicate a potential emergency and should be followed up as soon as possible.  Feel free to call the clinic you have any questions or concerns. The clinic phone number is [phone].  Please show the CHEMO ALERT CARD at check-in to the Emergency Department and triage nurse.   

## 2017-03-06 NOTE — Telephone Encounter (Signed)
Gave patient avs report and appointments for May and June.  °

## 2017-03-06 NOTE — Progress Notes (Signed)
OK to treat despite low platelets per Dr Burr Medico.  She reports that she will give reduced dose.

## 2017-03-07 ENCOUNTER — Encounter: Payer: Self-pay | Admitting: Hematology

## 2017-03-07 DIAGNOSIS — J449 Chronic obstructive pulmonary disease, unspecified: Secondary | ICD-10-CM | POA: Diagnosis not present

## 2017-03-10 ENCOUNTER — Encounter: Payer: Self-pay | Admitting: Family Medicine

## 2017-03-20 ENCOUNTER — Other Ambulatory Visit (HOSPITAL_BASED_OUTPATIENT_CLINIC_OR_DEPARTMENT_OTHER): Payer: Medicare Other

## 2017-03-20 ENCOUNTER — Ambulatory Visit: Payer: Medicare Other

## 2017-03-20 ENCOUNTER — Other Ambulatory Visit: Payer: Self-pay | Admitting: Hematology

## 2017-03-20 ENCOUNTER — Ambulatory Visit (HOSPITAL_BASED_OUTPATIENT_CLINIC_OR_DEPARTMENT_OTHER): Payer: Medicare Other

## 2017-03-20 VITALS — BP 108/50 | HR 84 | Temp 98.5°F

## 2017-03-20 DIAGNOSIS — Z95828 Presence of other vascular implants and grafts: Secondary | ICD-10-CM

## 2017-03-20 DIAGNOSIS — Z5111 Encounter for antineoplastic chemotherapy: Secondary | ICD-10-CM | POA: Diagnosis not present

## 2017-03-20 DIAGNOSIS — C160 Malignant neoplasm of cardia: Secondary | ICD-10-CM | POA: Diagnosis not present

## 2017-03-20 LAB — COMPREHENSIVE METABOLIC PANEL
ALT: 35 U/L (ref 0–55)
AST: 33 U/L (ref 5–34)
Albumin: 3.3 g/dL — ABNORMAL LOW (ref 3.5–5.0)
Alkaline Phosphatase: 60 U/L (ref 40–150)
Anion Gap: 8 mEq/L (ref 3–11)
BUN: 13.4 mg/dL (ref 7.0–26.0)
CALCIUM: 9.2 mg/dL (ref 8.4–10.4)
CHLORIDE: 104 meq/L (ref 98–109)
CO2: 29 mEq/L (ref 22–29)
Creatinine: 0.8 mg/dL (ref 0.7–1.3)
EGFR: 89 mL/min/{1.73_m2} — AB (ref >90)
Glucose: 147 mg/dl — ABNORMAL HIGH (ref 70–140)
POTASSIUM: 4.4 meq/L (ref 3.5–5.1)
Sodium: 141 mEq/L (ref 136–145)
Total Bilirubin: 0.63 mg/dL (ref 0.20–1.20)
Total Protein: 6.2 g/dL — ABNORMAL LOW (ref 6.4–8.3)

## 2017-03-20 LAB — CBC WITH DIFFERENTIAL/PLATELET
BASO%: 0.9 % (ref 0.0–2.0)
BASOS ABS: 0 10*3/uL (ref 0.0–0.1)
EOS%: 3.1 % (ref 0.0–7.0)
Eosinophils Absolute: 0.1 10*3/uL (ref 0.0–0.5)
HEMATOCRIT: 30.7 % — AB (ref 38.4–49.9)
HGB: 10 g/dL — ABNORMAL LOW (ref 13.0–17.1)
LYMPH#: 0.5 10*3/uL — AB (ref 0.9–3.3)
LYMPH%: 23.3 % (ref 14.0–49.0)
MCH: 30 pg (ref 27.2–33.4)
MCHC: 32.4 g/dL (ref 32.0–36.0)
MCV: 92.5 fL (ref 79.3–98.0)
MONO#: 0.4 10*3/uL (ref 0.1–0.9)
MONO%: 18.4 % — ABNORMAL HIGH (ref 0.0–14.0)
NEUT#: 1.2 10*3/uL — ABNORMAL LOW (ref 1.5–6.5)
NEUT%: 54.3 % (ref 39.0–75.0)
PLATELETS: 95 10*3/uL — AB (ref 140–400)
RBC: 3.32 10*6/uL — ABNORMAL LOW (ref 4.20–5.82)
RDW: 19 % — ABNORMAL HIGH (ref 11.0–14.6)
WBC: 2.2 10*3/uL — ABNORMAL LOW (ref 4.0–10.3)

## 2017-03-20 MED ORDER — PALONOSETRON HCL INJECTION 0.25 MG/5ML
0.2500 mg | Freq: Once | INTRAVENOUS | Status: AC
  Administered 2017-03-20: 0.25 mg via INTRAVENOUS

## 2017-03-20 MED ORDER — PACLITAXEL CHEMO INJECTION 300 MG/50ML
80.0000 mg/m2 | Freq: Once | INTRAVENOUS | Status: AC
  Administered 2017-03-20: 204 mg via INTRAVENOUS
  Filled 2017-03-20: qty 34

## 2017-03-20 MED ORDER — DEXAMETHASONE SODIUM PHOSPHATE 10 MG/ML IJ SOLN
INTRAMUSCULAR | Status: AC
  Filled 2017-03-20: qty 1

## 2017-03-20 MED ORDER — HEPARIN SOD (PORK) LOCK FLUSH 100 UNIT/ML IV SOLN
500.0000 [IU] | Freq: Once | INTRAVENOUS | Status: AC | PRN
  Administered 2017-03-20: 500 [IU]
  Filled 2017-03-20: qty 5

## 2017-03-20 MED ORDER — DEXAMETHASONE SODIUM PHOSPHATE 10 MG/ML IJ SOLN
10.0000 mg | Freq: Once | INTRAMUSCULAR | Status: AC
  Administered 2017-03-20: 10 mg via INTRAVENOUS

## 2017-03-20 MED ORDER — DIPHENHYDRAMINE HCL 50 MG/ML IJ SOLN
25.0000 mg | Freq: Once | INTRAMUSCULAR | Status: AC
  Administered 2017-03-20: 25 mg via INTRAVENOUS

## 2017-03-20 MED ORDER — PALONOSETRON HCL INJECTION 0.25 MG/5ML
INTRAVENOUS | Status: AC
  Filled 2017-03-20: qty 5

## 2017-03-20 MED ORDER — SODIUM CHLORIDE 0.9% FLUSH
10.0000 mL | INTRAVENOUS | Status: DC | PRN
  Administered 2017-03-20: 10 mL via INTRAVENOUS
  Filled 2017-03-20: qty 10

## 2017-03-20 MED ORDER — FAMOTIDINE IN NACL 20-0.9 MG/50ML-% IV SOLN
20.0000 mg | Freq: Once | INTRAVENOUS | Status: AC
  Administered 2017-03-20: 20 mg via INTRAVENOUS

## 2017-03-20 MED ORDER — FAMOTIDINE IN NACL 20-0.9 MG/50ML-% IV SOLN
INTRAVENOUS | Status: AC
  Filled 2017-03-20: qty 50

## 2017-03-20 MED ORDER — SODIUM CHLORIDE 0.9% FLUSH
10.0000 mL | INTRAVENOUS | Status: DC | PRN
  Administered 2017-03-20: 10 mL
  Filled 2017-03-20: qty 10

## 2017-03-20 MED ORDER — SODIUM CHLORIDE 0.9 % IV SOLN
Freq: Once | INTRAVENOUS | Status: AC
  Administered 2017-03-20: 14:00:00 via INTRAVENOUS

## 2017-03-20 MED ORDER — DIPHENHYDRAMINE HCL 50 MG/ML IJ SOLN
INTRAMUSCULAR | Status: AC
  Filled 2017-03-20: qty 1

## 2017-03-20 NOTE — Patient Instructions (Signed)
[facility] Discharge Instructions for Patients Receiving Chemotherapy  Today you received the following chemotherapy agents Taxol   To help prevent nausea and vomiting after your treatment, we encourage you to take your nausea medication as directed.   If you develop nausea and vomiting that is not controlled by your nausea medication, call the clinic.   BELOW ARE SYMPTOMS THAT SHOULD BE REPORTED IMMEDIATELY:  *FEVER GREATER THAN 100.5 F  *CHILLS WITH OR WITHOUT FEVER  NAUSEA AND VOMITING THAT IS NOT CONTROLLED WITH YOUR NAUSEA MEDICATION  *UNUSUAL SHORTNESS OF BREATH  *UNUSUAL BRUISING OR BLEEDING  TENDERNESS IN MOUTH AND THROAT WITH OR WITHOUT PRESENCE OF ULCERS  *URINARY PROBLEMS  *BOWEL PROBLEMS  UNUSUAL RASH Items with * indicate a potential emergency and should be followed up as soon as possible.  Feel free to call the clinic you have any questions or concerns. The clinic phone number is [phone].  Please show the CHEMO ALERT CARD at check-in to the Emergency Department and triage nurse.   

## 2017-03-20 NOTE — Progress Notes (Signed)
CBC reviewed by Dr. Burr Medico. Order received to HOLD Carboplatin today. Proceed with Taxol only. Amy, Infusion RN made aware.

## 2017-03-20 NOTE — Patient Instructions (Signed)

## 2017-03-28 NOTE — Progress Notes (Signed)
[place]  [phone] [phone]  Clinic Follow up Note   Patient Care Team: Tammi Sou, MD as PCP - General (Family Medicine) Harold Hedge, Darrick Grinder, MD as Consulting Physician (Allergy and Immunology) Shirley Muscat Loreen Freud, MD as Consulting Physician (Optometry) Sueanne Margarita, MD as Consulting Physician (Cardiology) Pyrtle, Lajuan Lines, MD as Consulting Physician (Gastroenterology) Latanya Maudlin, MD as Consulting Physician (Orthopedic Surgery) Jodi Marble, MD as Consulting Physician (Otolaryngology) Truitt Merle, MD as Consulting Physician (Hematology) Kyung Rudd, MD as Consulting Physician (Radiation Oncology) 04/03/2017    CHIEF COMPLAINTS:  Follow up GE junction cancer  Oncology History   Cancer of cardio-esophageal junction [name])   Staging form: Stomach, AJCC 7th Edition     Clinical stage from 05/04/2016: Stage IV ([name], N2, M1) - Signed by Truitt Merle, MD on 05/18/2016        Cancer of cardio-esophageal junction ([name])   05/04/2016 Initial Diagnosis    Cancer of cardio-esophageal junction ([place])      05/04/2016 Procedure    EGD showed a large ulcerating mass with no active bleeding at the gastroesophageal junction extending into the gastric cardia. The mass was not obstructing and partially circumferential, extends approximately 5 cm. Nonbleeding erosive gastropathy.      05/04/2016 Initial Biopsy    Esophageal gastric junction biopsy showed a poorly differentiated carcinoma underlying the squamous mucosa. There is lymphovascular invasion. No Intestinal metaplasia. IHC weakly positive CK5/6, p63 (-), favor squamous.        05/09/2016 Imaging    CT CAP w contrast showed mild wall thickening involving the distal esophagus and proximal stomach compatible with known cancer, enlarged mediastinal and upper abdominal lymph nodes are highly suspicious for metastatic adenopathy, propable liver cirrhosis.      06/04/2016 - 06/22/2016 Radiation Therapy     palliative radiation to esophageal cancer       07/06/2016 -  Chemotherapy    chemotherapy with weekly carboplatin and taxol, started on 07/06/2016, held 08/21/16-09/28/2016 due to hospitalization, changed to every 2 weeks from 11/30/2016      07/30/2016 Pathology Results    PD-L1 negative expression      08/28/2016 - 08/31/2016 Hospital Admission    The patient was admitted to 4 severe hypoglycemia, dehydration, and acute renal failure. Infection workup was negative. He recovered well with supportive care. His insulin and hypertension medication was held on discharge, except insulin sliding scale.      09/24/2016 Imaging    CT CAP W CONTRAST 09/26/2016 IMPRESSION: Decreased masslike soft tissue prominence at gastroesophageal junction, consistent with decreased size of primary gastroesophageal junction carcinoma. Resolution of paraesophageal and gastrohepatic ligament lymphadenopathy since prior exam. Other sub-cm mediastinal lymph nodes show little or no significant change. Stable indeterminate sub-cm low-attenuation lesion in the liver dome. Probable hepatic cirrhosis. Recommend continued attention on follow-up CT. No new or progressive metastatic disease identified. Cholelithiasis.  No radiographic evidence of cholecystitis. Colonic diverticulosis. No radiographic evidence of diverticulitis. Aortic atherosclerosis and three-vessel coronary artery calcification.      01/07/2017 Imaging    CT CAP w contrast 1. No residual esophageal mass or adenopathy. 2. New nodular airspace opacification in the left lower lobe. Continued attention on followup exams is warranted as metastatic disease cannot be definitively excluded. 3. Cirrhosis. 4. Trace left pleural effusion. 5. Aortic atherosclerosis (ICD10-170.0). Coronary artery calcification. 6. Cholelithiasis. 7. Mild basilar predominant subpleural reticular densities. Difficult to exclude interstitial lung disease.       HISTORY OF  PRESENTING  ILLNESS:  [name] 71 y.o. male is here because of His reason that diagnosed GE junction carcinoma. He is a comment of by his wife and daughter to our multidisciplinary chart clinic today.  He has been having progressive dysphagia and odynophagia for 2 month, he has been eating soft diet only in the past one week. He has pain in the mid chest and epigastric area only when he eats, with burning sensation, no nausea, abdominal bloating, or other discomfort. His appetite has remained well, no weight loss,   He has multiple medical problems, especially heart failure, he has been on oxygen continuously for 5-6 years. He is morbidly obese, has diabetes, hypertension, mild neuropathy, OSA etc. He has very sedentary life style, he sits in the chair and watch TV most of time during the day. He only comes out for shopping for a few times a week. He uses a Barr[employer], he can walk for a few hundrends feet before he has to stop to catch his breasts. He denies cough or sputum production, no GI discomfort, he has been having mild dirrhea, loose stool, twice daily, no melena or hematochezia.  CURRENT THERAPY: chemotherapy with weekly carboplatin and taxol, started on 07/06/2016, held 08/21/16-09/28/2016 due to hospitalization, changed to every 2 weeks from 11/30/2016; Due to moderate some cytopenia, I'll reduce carboplatin AUC to 1.0, continue Taxol 70 mg/m, continue chemotherapy every 2 weeks (03/06/17). Add Onpro patch starting 04/08/17   INTERIM HISTORY:  [name] returns for follow-up and treatment. He presents to the clinic today with his daughter-in-law. He reports to be doing well. He did not get sick but felt weak and dizzy last time.     MEDICAL HISTORY:  Past Medical History:  Diagnosis Date  . Asthma    as a child  . Cataract    multiple types, bilateral  . Cholelithiasis without cholecystitis   . Chronic diastolic CHF (congestive heart failure) ([place]) 02/25/2009  . Chronic  renal insufficiency, stage III (moderate) 2017   Stage II/III (GFR around 60)  . 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 COPD (chronic obstructive pulmonary disease) ([place])   . Coronary artery disease    chronically occluded LAD and diagonal with right to left collaterals, mild disease in the left circ and moderate disease in the mid RCA on medical management with Imdur, ASA, and Plavix.  . Dermatitis 05/2014  . DIABETES MELLITUS, TYPE II 06/24/2007   No diab retpthy as of 08/05/15 eye exam.  . Esophageal cancer ([place]) 04/2016   poorly differentiated carcinoma (Dr. Pyrtle--EGD).  CT C/A/P showed metastatic adenopathy in mediastinum and upper abdomen 05/09/16.  Tx plan is palliative radiation (completed 06/22/16), then palliative systemic chemotherapy (carbo+taxol) was started but as of 08/03/16 onc f/u this was held due to severe knee and ankle arthralgias.  Restarting as of 10/2015 (08/2016 CT showed some disease regression  . Esophageal cancer ([place])    CT 12/2016 showed no residual esoph mass--plan to continue chemo.  Ongoing chemo as of 02/2017 onc f/u.  Marland Kitchen Essential hypertension 05/01/2007   Qualifier: Diagnosis of  By: Tiney Rouge CMA, Ellison Hughs     . History of kidney stones   . Hyperkalemia 12/2015   Decreased ACE-I by 50% in response, then potassium normalized.  Marland Kitchen HYPERLIPIDEMIA 06/24/2007  . Hypoxemia 01/27/2010  . Myocardial infarction [facility])    pt states he was informed per MD that he  has had one but pt was unaware   . OBESITY 09/02/2008  . Obesity hypoventilation syndrome (HCC)    oxygen 24/7 (2 liters [place] as of 02/2016)  . On home oxygen therapy    Oxygen @ 2l/m nasally 24/7 hours  . OSA (obstructive sleep apnea)    not tested; pt scored 4 per stop bang tool results sent to PCP   . OSTEOARTHRITIS 05/01/2007  . Presbycusis of both ears 05/2015   [facility]  . Pruritic condition 05/2014   Allergist summer 2015, no new testing.  [name] field  defect 05/10/16   Per Dr. Melina Fiddler, O.D.: Rt, loss of inf/temp quad and some loss sup/temp.  ?CVA  ? Pituitary tumor? ?Brain met.    SURGICAL HISTORY: Past Surgical History:  Procedure Laterality Date  . APPENDECTOMY    . BALLOON DILATION N/A 05/04/2016   Procedure: BALLOON DILATION;  Surgeon: Jerene Bears, MD;  Location: [facility];  Service: Gastroenterology;  Laterality: N/A;  . CARDIAC CATHETERIZATION    . CARPAL TUNNEL RELEASE Left   . CATARACT EXTRACTION W/PHACO Right 04/29/2013   Procedure: CATARACT EXTRACTION PHACO AND INTRAOCULAR LENS PLACEMENT (IOC);  Surgeon: Adonis Brook, MD;  Location: [facility];  Service: Ophthalmology;  Laterality: Right;  . CATARACT EXTRACTION, BILATERAL    . COLONOSCOPY W/ POLYPECTOMY  08/2011   Many polyps--all hyperplastic, severe diverticulosis, int hem.  [employer] hemoccult testing via [facility] 06/22/15 NEG  . ESOPHAGOGASTRODUODENOSCOPY N/A 05/04/2016   Procedure: ESOPHAGOGASTRODUODENOSCOPY (EGD);  Surgeon: Jerene Bears, MD;  Location: [facility];  Service: Gastroenterology;  Laterality: N/A;  . IR GENERIC HISTORICAL  10/03/2016   IR US GUIDE VASC ACCESS RIGHT 10/03/2016 Greggory Keen, MD [facility]  . IR GENERIC HISTORICAL  10/03/2016   IR FLUORO GUIDE PORT INSERTION RIGHT 10/03/2016 Greggory Keen, MD [facility]  . KNEE ARTHROSCOPY WITH MEDIAL MENISECTOMY Right 12/16/2014   Procedure: RIGHT KNEE ARTHROSCOPY WITH MEDIAL MENISECTOMY microfracture medial femoral condyle abrasion condroplasty medial femoral condyle lateral menisectomy;  Surgeon: Tobi Bastos, MD;  Location: [facility];  Service: Orthopedics;  Laterality: Right;  . LEFT HEART CATHETERIZATION WITH CORONARY ANGIOGRAM N/A 10/26/2013   Procedure: LEFT HEART CATHETERIZATION WITH CORONARY ANGIOGRAM;  Surgeon: Jettie Booze, MD;  Location: [facility] CATH LAB;  Service: Cardiovascular;  Laterality: N/A;  . LUMBAR [place] SURGERY  2001  . SHOULDER SURGERY Right    right fx  . TONSILLECTOMY    . TRANSTHORACIC  ECHOCARDIOGRAM  03/2016   EF 55-60%, grade I DD, LAE  . WRIST SURGERY Right    right fx    SOCIAL HISTORY: Social History   Social History  . Marital status: Married    Spouse name: susan  . Number of children: 2  . Years of education: N/A   Occupational History  . Retired    Social History Main Topics  . Smoking status: Former Smoker    Packs/day: 1.50    Years: 30.00    Types: Cigarettes, Pipe, Cigars    Quit date: 10/30/1983  . Smokeless tobacco: Former Systems developer    Types: Chew    Quit date: 05/15/2016  . Alcohol use No  . Drug use: No  . Sexual activity: Not Currently   Other Topics Concern  . Not on file   Social History Narrative   Married, one son and one daughter.   His daughter and her two children live with him.   Coffee daily.  Former smoker.  No alcohol.   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   Attends church weekly-   Oxygen continuous       FAMILY HISTORY: Family History  Problem Relation Age of Onset  . Heart attack Mother   . Aneurysm Father   . Alcohol abuse Father   . Diabetes Maternal Aunt   . Colon cancer Neg Hx     ALLERGIES:  has No Known Allergies.  MEDICATIONS:  Current Outpatient Prescriptions  Medication Sig Dispense Refill  . aspirin 81 MG tablet Take 81 mg by mouth daily.      Marland Kitchen atorvastatin (LIPITOR) 80 MG tablet TAKE ONE TABLET BY MOUTH ONCE DAILY 90 tablet 2  . beta carotene w/minerals (OCUVITE) tablet Take 1 tablet by mouth daily.    . cetirizine (ZYRTEC) 10 MG tablet Take 10 mg by mouth daily.    . citalopram (CELEXA) 20 MG tablet TAKE ONE TABLET BY MOUTH ONCE DAILY 90 tablet 1  . clonazePAM (KLONOPIN) 1 MG tablet TAKE ONE TABLET BY MOUTH TWICE DAILY AS NEEDED FOR ANXIETY 60 tablet 5  . cromolyn (OPTICROM) 4 % ophthalmic solution Place 1 drop into both eyes 4 (four) times daily.    Marland Kitchen gabapentin (NEURONTIN) 300 MG capsule TAKE ONE CAPSULE BY MOUTH THREE TIMES DAILY 270 capsule 3  . HUMALOG KWIKPEN  200 UNIT/ML SOPN INJECT 24 UNITS SUBCUTANEOUSLY THREE TIMES DAILY BEFORE MEAL(S) 18 pen 6  . hydrOXYzine (ATARAX/VISTARIL) 25 MG tablet TAKE 1 TABLET BY MOUTH AT SUPPER AND 1 TABLET BY MOUTH AT BEDTIME FOR INSOMNIA 60 tablet 6  . insulin lispro (HUMALOG KWIKPEN) 100 UNIT/ML KiwkPen Inject into the skin. SS at meal time    . Insulin NPH, Human,, Isophane, (HUMULIN N) 100 UNIT/ML Kiwkpen 16 units every morning and 16 units every evening 15 mL 3  . magnesium chloride (SLOW-MAG) 64 MG TBEC SR tablet Take 1 tablet (64 mg total) by mouth daily. 60 tablet 0  . metFORMIN (GLUCOPHAGE) 1000 MG tablet TAKE ONE TABLET BY MOUTH TWICE DAILY WITH MEALS 180 tablet 1  . Omega-3 Fatty Acids ([employer] FISH OIL) 1000 MG CAPS Take 2 tablets by mouth 2 (two) times daily. 90 capsule   . ondansetron (ZOFRAN) 8 MG tablet Take 1 tablet (8 mg total) by mouth 2 (two) times daily as needed for refractory nausea / vomiting. Start on day 3 after chemo. 30 tablet 1  . ONE TOUCH ULTRA TEST test strip     . ONETOUCH DELICA LANCETS 85I MISC USE ONE  TO CHECK GLUCOSE THREE TIMES DAILY 100 each 11  . oxyCODONE (OXY IR/ROXICODONE) 5 MG immediate release tablet Take 1 tablet (5 mg total) by mouth every 8 (eight) hours as needed for severe pain. 30 tablet 0  . OXYGEN Inhale 2 L/min into the lungs continuous.    . pantoprazole (PROTONIX) 40 MG tablet TAKE ONE TABLET BY MOUTH TWICE DAILY 60 tablet 3  . prochlorperazine (COMPAZINE) 10 MG tablet Take 1 tablet (10 mg total) by mouth every 6 (six) hours as needed (Nausea or vomiting). 30 tablet 1  . ZETIA 10 MG tablet TAKE ONE TABLET BY MOUTH ONCE DAILY 90 tablet 1   No current facility-administered medications for this visit.     REVIEW OF SYSTEMS:  Constitutional: Denies fevers, abnormal night sweats (+) dizzy Eyes: Denies blurriness of vision, double vision or watery eyes Ears, nose, mouth, throat, and face: Denies mucositis or sore throat, (+) trouble swallowing Respiratory: Denies cough,  dyspnea or wheezes  Cardiovascular: Denies palpitation, chest discomfort or lower extremity swelling Gastrointestinal:  Denies nausea, heartburn or change in bowel habits (+) diarrhea Skin: Denies abnormal skin rashes Lymphatics: Denies new lymphadenopathy or easy bruising Neurological:Denies numbness, tingling  (+) body weakness Behavioral/Psych: Mood is stable, no new changes  All other systems were reviewed with the patient and are negative.  PHYSICAL EXAMINATION: ECOG PERFORMANCE STATUS: 3 - Symptomatic, >50% confined to bed  Vitals:   04/03/17 1303  BP: (!) 122/47  Pulse: 100  Resp: 17  Temp: 98.5 F (36.9 C)   Filed Weights   04/03/17 1303  Weight: 273 lb 4.8 oz (124 kg)     GENERAL:alert, no distress and comfortable, morbid obese, sitting in wheelchair with nasal cannula oxygen  SKIN: skin color, texture, turgor are normal, no rashes or significant lesions EYES: normal, conjunctiva are pink and non-injected, sclera clear OROPHARYNX:no exudate, no erythema and lips, buccal mucosa, and tongue normal  NECK: supple, thyroid normal size, non-tender, without nodularity LYMPH:  no palpable lymphadenopathy in the cervical, axillary or inguinal LUNGS: clear, (+) rales on bilateral lung base HEART: regular rate & rhythm and no murmurs and no lower extremity edema ABDOMEN:abdomen soft, non-tender and normal bowel sounds Musculoskeletal:no cyanosis of digits and no clubbing  PSYCH: alert & oriented x 3 with fluent speech NEURO: no focal motor/sensory deficits EXT: Trace edema, he wears compression stocks, no significant ankle swollen, erythema, or warmness.   LABORATORY DATA:  I have reviewed the data as listed CBC Latest Ref Rng & Units 04/03/2017 03/20/2017 03/06/2017  WBC 4.0 - 10.3 10e3/uL 2.9(L) 2.2(L) 2.7(L)  Hemoglobin 13.0 - 17.1 g/dL 10.1(L) 10.0(L) 10.0(L)  Hematocrit 38.4 - 49.9 % 33.0(L) 30.7(L) 32.5(L)  Platelets 140 - 400 10e3/uL 117(L) 95(L) 88(L)   CMP Latest Ref  Rng & Units 04/03/2017 03/20/2017 03/06/2017  Glucose 70 - 140 mg/dl 131 147(H) 143(H)  BUN 7.0 - 26.0 mg/dL 15.2 13.4 11.2  Creatinine 0.7 - 1.3 mg/dL 0.9 0.8 0.8  Sodium 136 - 145 mEq/L 137 141 138  Potassium 3.5 - 5.1 mEq/L 4.2 4.4 4.2  Chloride 96 - 106 mmol/L - - -  CO2 22 - 29 mEq/L _0 Calcium 8.4 - 10.4 mg/dL 9.2 9.2 8.6  Total Protein 6.4 - 8.3 g/dL 6.5 6.2(L) 6.2(L)  Total Bilirubin 0.20 - 1.20 mg/dL 0.68 0.63 0.68  Alkaline Phos 40 - 150 U/L 68 60 62  AST 5 - 34 U/L 47(H) 33 37(H)  ALT 0 - 55 U/L 42 35 37   PATHOLOGY REPORT Diagnosis 05/04/2016 1. Esophagogastric junction, biopsy, ulcerative mass - POORLY DIFFERENTIATED CARCINOMA, SEE COMMENT. 2. Stomach, biopsy - REACTIVE GASTROPATHY. - NEGATIVE FOR HELICOBACTER PYLORI. - NO INTESTINAL METAPLASIA, DYSPLASIA, OR MALIGNANCY. Microscopic Comment 1. The majority of the specimen consists of gastroesophageal mucosa with reflux changes. There is a small focus of tumor underlying the squamous mucosa. There is lymphovascular invasion. There is no background intestinal metaplasia (Barrett's esophagus). Immunohistochemistry reveals only very focal weak cytokeratin 5/6, negative p63, and negative mucicarmine in the limited remaining tumor. Dr. Saralyn Pilar has reviewed the case. The case was called to Dr. Hilarie Fredrickson on 05/08/2016. 2. A Warthin-Starry stain is performed to determine the possibility of the presence of Helicobacter pylori. The Warthin-Starry stain is negative for organisms of Helicobacter pylori.     RADIOGRAPHIC STUDIES: I have personally reviewed the radiological images as listed and agreed with the findings in the report.  CT CAP w contrast 01/07/2017 IMPRESSION: 1. No residual  esophageal mass or adenopathy. 2. New nodular airspace opacification in the left lower lobe. Continued attention on followup exams is warranted as metastatic disease cannot be definitively excluded. 3. Cirrhosis. 4. Trace left pleural  effusion. 5. Aortic atherosclerosis (ICD10-170.0). Coronary artery calcification. 6. Cholelithiasis. 7. Mild basilar predominant subpleural reticular densities. Difficult to exclude interstitial lung disease.  EGD 05/04/2016 A large, ulcerating mass with no active bleeding and no stigmata of recent bleeding was found at the gastroesophageal junction extending into the gastric cardia, beginning 40 cm from the incisors. The mass was non-obstructing and partially circumferential (involving one-half of the lumen circumference). The mass extends approximately 5 cm. IMPRESSION:  -Likely malignant esophageal tumor was found at the gastroesophageal junction extending into the gastric cardia. Biopsied. - Non-bleeding erosive gastropathy. Biopsied. - Erythematous duodenopathy. - Normal second portion of the duodenum.  ASSESSMENT & PLAN: 71 y.o. Caucasian male, with multiple comorbidities, including hypertension, diabetes, OSA, coronary artery disease, diastolic CHF, on continuous oxygen, morbid obesity, presented with progressive dysphagia and odynophagia.  1. Cancer of cardio-esophageal junction, poorly differentiated, cTxN2M1 with node metastasis, stage IV  -I previously reviewed his CT scan, EGD, and the biopsy results with patient and his family members in great details. -His biopsy pathology was previously reviewed in our tumor board, the morphology and weakly positive for CK 5/6, fevers squamous cell carcinoma -His CT scan findings are very concerning for distant metastasis to abdominal lymph nodes.  -I previously reviewed the PET scan images with patient and his daughter in person, he has hypermetabolic GE junction tumor, and diffuse adenopathy in mediastinum and upper abdomen. -We previously discussed the aggressive nature of esophageal cancer, an incurable nature of his disease due to the distant metastasis. The goal of therapy is palliative, to prolong his life and palliate his  symptoms. -His tumor was negative for PD-L1, not a candidate for immunotherapy  -He is on first line palliative chemotherapy with weekly carbo and Taxol, every 2 weeks, tolerating low dose better lately. -I previously reviewed his CT scan findings on 09/26/2016 show good partial response to chemoradiation, no new or progressive metastatic disease. - I previously  reviewed his restaging CT scan from 01/07/2017,  Which showed no residual esophageal mass or adenopathy, he has mild new infiltrative change in LLL, possible related to aspiration, he is asymptomatic, will continue monitoring  -He is clinically doing well, tolerating chemotherapy well, lab results reviewed with patient, mild pancytopenia, secondary to chemotherapy, adequate for treatment, we'll continue low-dose carboplatin and Taxol every 2 weeks. Due to the thrombocytopenia, I'll decrease her carboplatin to AUC 1 today. -plan to repeat CT scan in [facility] June -labs reviewed and blood counts improved but still low. I will add a onpro patch.  -I strongly encouraged him to take claritin for possible bone pain -Will reduce chemo dose more and continue with it every 2 weeks   2. CAD, diastolic CHF, on continuous oxygen -He will continue follow-up with his cardiologist Dr. Radford Pax -His Plavix has been held since the EGD and biopsy, he is on baby aspirin. -Need to watch his fluid intake during his chemotherapy, and also avoid fluid overload from chemo  -he is clinically stable  3. DM, HTN -We previously discussed his blood pressure and glucose need to be monitored closely during the chemotherapy, which may affect his blood glucose and blood pressure -He will continue medication, monitor his sugar and blood pressure at home, and follow-up with his primary care physician. -will decrease premed dexa before taxol  -  History simple has been held due to his borderline low blood pressure -He will see his primary care physician Dr. Anitra Lauth for diabetes  management.  4. OSA, morbid obesity -We previously discussed healthy diet, I encouraged him to to be more physically active, and consider home PT -previously I strongly encouraged him to follow-up with dietitian during the therapy for nutrition support.  5. Arthralgia -He has baseline osteoarthritis, not physically active -However his arthralgia in his knees and ankle are much worse after chemotherapy but resolved now  -He will use oxycodone 5 mg as needed, constipation management previously reviewed with him. -Leg weakness improved with physical therapy. Previously encouraged the patient to continue with physical therapy.   6. Pancytopenia  -Second is to underlying malignancy and chemotherapy -Mild anemia is stable, his hemoglobin is 10.0 today, thrombocytopenia worse, platelet 88K today, we'll reduce chemotherapy dose -we'll consider blood transfusion if hemoglobin less than 8, for symptomatic anemia.  7. Goal of care discussion  -We previously discussed the incurable nature of his cancer, and the overall poor prognosis, especially if he does not have good response to chemotherapy or progress on chemo -The patient understands the goal of care is palliative. -Patient is very realistic, understands his cancer is not curable, and he agrees with DO NOT RESUSCITATE and DO NOT INTUBATE   PLAN -Prescribe Onpro patch  -f/u in 2 weeks with NP -Lab, flush, f/u and chemo carbo and taxol in 4 and 6  weeks with me -CT scan 3-4 weeks -Lab reviewed, adequate for treatment, I'll reduce carboplatin dose due to his cytopenia, continue taxol 64m/m2, continue chemotherapy every 2 weeks with onpro    All questions were answered. The patient knows to call the clinic with any problems, questions or concerns.  I spent 20 minutes counseling the patient face to face. The total time spent in the appointment was 25 minutes and more than 35% was on counseling.  This document serves as a record of services  personally performed by YTruitt Merle MD. It was created on her behalf by AJoslyn Devon a trained medical scribe. The creation of this record is based on the scribe's personal observations and the provider's statements to them. This document has been checked and approved by the attending provider.     FTruitt Merle MD 04/03/2017

## 2017-04-03 ENCOUNTER — Ambulatory Visit (HOSPITAL_BASED_OUTPATIENT_CLINIC_OR_DEPARTMENT_OTHER): Payer: Medicare Other

## 2017-04-03 ENCOUNTER — Ambulatory Visit: Payer: Medicare Other

## 2017-04-03 ENCOUNTER — Telehealth: Payer: Self-pay | Admitting: Hematology

## 2017-04-03 ENCOUNTER — Other Ambulatory Visit (HOSPITAL_BASED_OUTPATIENT_CLINIC_OR_DEPARTMENT_OTHER): Payer: Medicare Other

## 2017-04-03 ENCOUNTER — Ambulatory Visit (HOSPITAL_BASED_OUTPATIENT_CLINIC_OR_DEPARTMENT_OTHER): Payer: Medicare Other | Admitting: Hematology

## 2017-04-03 VITALS — BP 122/47 | HR 100 | Temp 98.5°F | Resp 17 | Ht 68.0 in | Wt 273.3 lb

## 2017-04-03 DIAGNOSIS — Z5111 Encounter for antineoplastic chemotherapy: Secondary | ICD-10-CM | POA: Diagnosis not present

## 2017-04-03 DIAGNOSIS — E119 Type 2 diabetes mellitus without complications: Secondary | ICD-10-CM | POA: Diagnosis not present

## 2017-04-03 DIAGNOSIS — C160 Malignant neoplasm of cardia: Secondary | ICD-10-CM | POA: Diagnosis not present

## 2017-04-03 DIAGNOSIS — Z95828 Presence of other vascular implants and grafts: Secondary | ICD-10-CM

## 2017-04-03 DIAGNOSIS — Z5189 Encounter for other specified aftercare: Secondary | ICD-10-CM | POA: Diagnosis not present

## 2017-04-03 DIAGNOSIS — I5032 Chronic diastolic (congestive) heart failure: Secondary | ICD-10-CM | POA: Diagnosis not present

## 2017-04-03 DIAGNOSIS — D61810 Antineoplastic chemotherapy induced pancytopenia: Secondary | ICD-10-CM

## 2017-04-03 DIAGNOSIS — I10 Essential (primary) hypertension: Secondary | ICD-10-CM

## 2017-04-03 LAB — COMPREHENSIVE METABOLIC PANEL
ALT: 42 U/L (ref 0–55)
ANION GAP: 9 meq/L (ref 3–11)
AST: 47 U/L — ABNORMAL HIGH (ref 5–34)
Albumin: 3.4 g/dL — ABNORMAL LOW (ref 3.5–5.0)
Alkaline Phosphatase: 68 U/L (ref 40–150)
BILIRUBIN TOTAL: 0.68 mg/dL (ref 0.20–1.20)
BUN: 15.2 mg/dL (ref 7.0–26.0)
CHLORIDE: 103 meq/L (ref 98–109)
CO2: 26 meq/L (ref 22–29)
Calcium: 9.2 mg/dL (ref 8.4–10.4)
Creatinine: 0.9 mg/dL (ref 0.7–1.3)
EGFR: 86 mL/min/{1.73_m2} — AB (ref >90)
GLUCOSE: 131 mg/dL (ref 70–140)
POTASSIUM: 4.2 meq/L (ref 3.5–5.1)
SODIUM: 137 meq/L (ref 136–145)
Total Protein: 6.5 g/dL (ref 6.4–8.3)

## 2017-04-03 LAB — CBC WITH DIFFERENTIAL/PLATELET
BASO%: 0.7 % (ref 0.0–2.0)
Basophils Absolute: 0 10*3/uL (ref 0.0–0.1)
EOS ABS: 0 10*3/uL (ref 0.0–0.5)
EOS%: 1.4 % (ref 0.0–7.0)
HCT: 33 % — ABNORMAL LOW (ref 38.4–49.9)
HGB: 10.1 g/dL — ABNORMAL LOW (ref 13.0–17.1)
LYMPH%: 27.4 % (ref 14.0–49.0)
MCH: 29.5 pg (ref 27.2–33.4)
MCHC: 30.6 g/dL — AB (ref 32.0–36.0)
MCV: 96.5 fL (ref 79.3–98.0)
MONO#: 0.5 10*3/uL (ref 0.1–0.9)
MONO%: 18.4 % — AB (ref 0.0–14.0)
NEUT#: 1.5 10*3/uL (ref 1.5–6.5)
NEUT%: 52.1 % (ref 39.0–75.0)
PLATELETS: 117 10*3/uL — AB (ref 140–400)
RBC: 3.42 10*6/uL — AB (ref 4.20–5.82)
RDW: 17.6 % — ABNORMAL HIGH (ref 11.0–14.6)
WBC: 2.9 10*3/uL — AB (ref 4.0–10.3)
lymph#: 0.8 10*3/uL — ABNORMAL LOW (ref 0.9–3.3)

## 2017-04-03 MED ORDER — PALONOSETRON HCL INJECTION 0.25 MG/5ML
0.2500 mg | Freq: Once | INTRAVENOUS | Status: AC
  Administered 2017-04-03: 0.25 mg via INTRAVENOUS

## 2017-04-03 MED ORDER — CARBOPLATIN CHEMO INJECTION 450 MG/45ML
225.0000 mg | Freq: Once | INTRAVENOUS | Status: AC
  Administered 2017-04-03: 230 mg via INTRAVENOUS
  Filled 2017-04-03: qty 23

## 2017-04-03 MED ORDER — DEXAMETHASONE SODIUM PHOSPHATE 10 MG/ML IJ SOLN
INTRAMUSCULAR | Status: AC
  Filled 2017-04-03: qty 1

## 2017-04-03 MED ORDER — FAMOTIDINE IN NACL 20-0.9 MG/50ML-% IV SOLN
20.0000 mg | Freq: Once | INTRAVENOUS | Status: AC
  Administered 2017-04-03: 20 mg via INTRAVENOUS

## 2017-04-03 MED ORDER — PEGFILGRASTIM 6 MG/0.6ML ~~LOC~~ PSKT
6.0000 mg | PREFILLED_SYRINGE | Freq: Once | SUBCUTANEOUS | Status: AC
  Administered 2017-04-03: 6 mg via SUBCUTANEOUS
  Filled 2017-04-03: qty 0.6

## 2017-04-03 MED ORDER — HEPARIN SOD (PORK) LOCK FLUSH 100 UNIT/ML IV SOLN
500.0000 [IU] | Freq: Once | INTRAVENOUS | Status: AC | PRN
  Administered 2017-04-03: 500 [IU]
  Filled 2017-04-03: qty 5

## 2017-04-03 MED ORDER — SODIUM CHLORIDE 0.9% FLUSH
10.0000 mL | INTRAVENOUS | Status: DC | PRN
  Administered 2017-04-03: 10 mL
  Filled 2017-04-03: qty 10

## 2017-04-03 MED ORDER — DEXAMETHASONE SODIUM PHOSPHATE 10 MG/ML IJ SOLN
10.0000 mg | Freq: Once | INTRAMUSCULAR | Status: AC
  Administered 2017-04-03: 10 mg via INTRAVENOUS

## 2017-04-03 MED ORDER — SODIUM CHLORIDE 0.9% FLUSH
10.0000 mL | INTRAVENOUS | Status: DC | PRN
  Administered 2017-04-03: 10 mL via INTRAVENOUS
  Filled 2017-04-03: qty 10

## 2017-04-03 MED ORDER — SODIUM CHLORIDE 0.9 % IV SOLN
Freq: Once | INTRAVENOUS | Status: AC
  Administered 2017-04-03: 14:00:00 via INTRAVENOUS

## 2017-04-03 MED ORDER — DEXTROSE 5 % IV SOLN
80.0000 mg/m2 | Freq: Once | INTRAVENOUS | Status: AC
  Administered 2017-04-03: 204 mg via INTRAVENOUS
  Filled 2017-04-03: qty 34

## 2017-04-03 MED ORDER — DIPHENHYDRAMINE HCL 50 MG/ML IJ SOLN
25.0000 mg | Freq: Once | INTRAMUSCULAR | Status: AC
  Administered 2017-04-03: 25 mg via INTRAVENOUS

## 2017-04-03 MED ORDER — DIPHENHYDRAMINE HCL 50 MG/ML IJ SOLN
INTRAMUSCULAR | Status: AC
  Filled 2017-04-03: qty 1

## 2017-04-03 MED ORDER — PALONOSETRON HCL INJECTION 0.25 MG/5ML
INTRAVENOUS | Status: AC
  Filled 2017-04-03: qty 5

## 2017-04-03 MED ORDER — FAMOTIDINE IN NACL 20-0.9 MG/50ML-% IV SOLN
INTRAVENOUS | Status: AC
  Filled 2017-04-03: qty 50

## 2017-04-03 NOTE — Telephone Encounter (Signed)
Scheduled appt per 6/6 los. Gave patient AVS and calender.

## 2017-04-03 NOTE — Progress Notes (Signed)
Patient and dtr both watched the video for the OnPro body injector for neulasta. Patient stated on O2 with nasal cannula at 2L during entire treatment.  Patient left with his own O2 at 2L.

## 2017-04-03 NOTE — Patient Instructions (Signed)
[facility] Discharge Instructions for Patients Receiving Chemotherapy  Today you received the following chemotherapy agents Taxol and Carboplatin  To help prevent nausea and vomiting after your treatment, we encourage you to take your nausea medication as directed. No Zofran for 3 days. Take Compazine instead.   If you develop nausea and vomiting that is not controlled by your nausea medication, call the clinic.   BELOW ARE SYMPTOMS THAT SHOULD BE REPORTED IMMEDIATELY:  *FEVER GREATER THAN 100.5 F  *CHILLS WITH OR WITHOUT FEVER  NAUSEA AND VOMITING THAT IS NOT CONTROLLED WITH YOUR NAUSEA MEDICATION  *UNUSUAL SHORTNESS OF BREATH  *UNUSUAL BRUISING OR BLEEDING  TENDERNESS IN MOUTH AND THROAT WITH OR WITHOUT PRESENCE OF ULCERS  *URINARY PROBLEMS  *BOWEL PROBLEMS  UNUSUAL RASH Items with * indicate a potential emergency and should be followed up as soon as possible.  Feel free to call the clinic you have any questions or concerns. The clinic phone number is [phone].  Please show the CHEMO ALERT CARD at check-in to the Emergency Department and triage nurse.   

## 2017-04-05 ENCOUNTER — Other Ambulatory Visit: Payer: Self-pay | Admitting: Family Medicine

## 2017-04-06 ENCOUNTER — Encounter: Payer: Self-pay | Admitting: Hematology

## 2017-04-07 DIAGNOSIS — J449 Chronic obstructive pulmonary disease, unspecified: Secondary | ICD-10-CM | POA: Diagnosis not present

## 2017-04-09 ENCOUNTER — Telehealth: Payer: Self-pay

## 2017-04-09 ENCOUNTER — Other Ambulatory Visit: Payer: Self-pay

## 2017-04-09 MED ORDER — EZETIMIBE 10 MG PO TABS: 10.0000 mg | ORAL_TABLET | Freq: Every day | ORAL | 3 refills | Status: DC

## 2017-04-09 NOTE — Telephone Encounter (Signed)
**Note De-Identified  Obfuscation** I called the pt to see if he has to take name brand Zetia or if he can take the generic Ezetimibe. The pt states that he has taken both Zetia and Ezetimibe in the past without any side effects from either.  He is advised that I did a PA on Zetia (as it was sent in as name brand to the pharmacy) and it was denied but that his insurance will pay for generic Ezetimibe.  Per the pts request I have sent RX for Ezetimibe 10 mg to [facility] on [address] to fill.

## 2017-04-15 ENCOUNTER — Other Ambulatory Visit: Payer: Self-pay | Admitting: Hematology

## 2017-04-16 ENCOUNTER — Other Ambulatory Visit (HOSPITAL_BASED_OUTPATIENT_CLINIC_OR_DEPARTMENT_OTHER): Payer: Medicare Other

## 2017-04-16 ENCOUNTER — Ambulatory Visit (HOSPITAL_BASED_OUTPATIENT_CLINIC_OR_DEPARTMENT_OTHER): Payer: Medicare Other

## 2017-04-16 ENCOUNTER — Telehealth: Payer: Self-pay | Admitting: *Deleted

## 2017-04-16 ENCOUNTER — Ambulatory Visit (HOSPITAL_BASED_OUTPATIENT_CLINIC_OR_DEPARTMENT_OTHER): Payer: Medicare Other | Admitting: Nurse Practitioner

## 2017-04-16 ENCOUNTER — Encounter: Payer: Self-pay | Admitting: Family Medicine

## 2017-04-16 ENCOUNTER — Ambulatory Visit: Payer: Medicare Other

## 2017-04-16 VITALS — BP 112/64 | HR 108 | Temp 98.4°F | Resp 18 | Ht 68.0 in | Wt 274.3 lb

## 2017-04-16 DIAGNOSIS — I10 Essential (primary) hypertension: Secondary | ICD-10-CM | POA: Diagnosis not present

## 2017-04-16 DIAGNOSIS — E119 Type 2 diabetes mellitus without complications: Secondary | ICD-10-CM | POA: Diagnosis not present

## 2017-04-16 DIAGNOSIS — Z95828 Presence of other vascular implants and grafts: Secondary | ICD-10-CM

## 2017-04-16 DIAGNOSIS — I5030 Unspecified diastolic (congestive) heart failure: Secondary | ICD-10-CM

## 2017-04-16 DIAGNOSIS — C160 Malignant neoplasm of cardia: Secondary | ICD-10-CM

## 2017-04-16 DIAGNOSIS — Z5111 Encounter for antineoplastic chemotherapy: Secondary | ICD-10-CM | POA: Diagnosis not present

## 2017-04-16 LAB — CBC WITH DIFFERENTIAL/PLATELET
BASO%: 0.3 % (ref 0.0–2.0)
BASOS ABS: 0 10*3/uL (ref 0.0–0.1)
EOS ABS: 0.1 10*3/uL (ref 0.0–0.5)
EOS%: 0.9 % (ref 0.0–7.0)
HEMATOCRIT: 32.5 % — AB (ref 38.4–49.9)
HEMOGLOBIN: 9.9 g/dL — AB (ref 13.0–17.1)
LYMPH#: 1 10*3/uL (ref 0.9–3.3)
LYMPH%: 10.8 % — ABNORMAL LOW (ref 14.0–49.0)
MCH: 29.5 pg (ref 27.2–33.4)
MCHC: 30.5 g/dL — ABNORMAL LOW (ref 32.0–36.0)
MCV: 96.7 fL (ref 79.3–98.0)
MONO#: 0.6 10*3/uL (ref 0.1–0.9)
MONO%: 7.1 % (ref 0.0–14.0)
NEUT%: 80.9 % — ABNORMAL HIGH (ref 39.0–75.0)
NEUTROS ABS: 7.1 10*3/uL — AB (ref 1.5–6.5)
NRBC: 0 % (ref 0–0)
PLATELETS: 95 10*3/uL — AB (ref 140–400)
RBC: 3.36 10*6/uL — AB (ref 4.20–5.82)
RDW: 17.9 % — AB (ref 11.0–14.6)
WBC: 8.8 10*3/uL (ref 4.0–10.3)

## 2017-04-16 LAB — COMPREHENSIVE METABOLIC PANEL
ALBUMIN: 3.2 g/dL — AB (ref 3.5–5.0)
ALK PHOS: 91 U/L (ref 40–150)
ALT: 45 U/L (ref 0–55)
ANION GAP: 11 meq/L (ref 3–11)
AST: 43 U/L — ABNORMAL HIGH (ref 5–34)
BILIRUBIN TOTAL: 0.91 mg/dL (ref 0.20–1.20)
BUN: 14.2 mg/dL (ref 7.0–26.0)
CO2: 26 meq/L (ref 22–29)
CREATININE: 0.8 mg/dL (ref 0.7–1.3)
Calcium: 9 mg/dL (ref 8.4–10.4)
Chloride: 104 mEq/L (ref 98–109)
EGFR: 88 mL/min/{1.73_m2} — AB (ref >90)
Glucose: 154 mg/dl — ABNORMAL HIGH (ref 70–140)
Potassium: 3.8 mEq/L (ref 3.5–5.1)
Sodium: 141 mEq/L (ref 136–145)
TOTAL PROTEIN: 6.4 g/dL (ref 6.4–8.3)

## 2017-04-16 MED ORDER — PACLITAXEL CHEMO INJECTION 300 MG/50ML
80.0000 mg/m2 | Freq: Once | INTRAVENOUS | Status: AC
  Administered 2017-04-16: 204 mg via INTRAVENOUS
  Filled 2017-04-16: qty 34

## 2017-04-16 MED ORDER — HEPARIN SOD (PORK) LOCK FLUSH 100 UNIT/ML IV SOLN
500.0000 [IU] | Freq: Once | INTRAVENOUS | Status: AC | PRN
  Administered 2017-04-16: 500 [IU]
  Filled 2017-04-16: qty 5

## 2017-04-16 MED ORDER — DEXAMETHASONE SODIUM PHOSPHATE 10 MG/ML IJ SOLN
INTRAMUSCULAR | Status: AC
  Filled 2017-04-16: qty 1

## 2017-04-16 MED ORDER — SODIUM CHLORIDE 0.9 % IV SOLN
225.0000 mg | Freq: Once | INTRAVENOUS | Status: AC
  Administered 2017-04-16: 230 mg via INTRAVENOUS
  Filled 2017-04-16: qty 23

## 2017-04-16 MED ORDER — DIPHENHYDRAMINE HCL 50 MG/ML IJ SOLN
25.0000 mg | Freq: Once | INTRAMUSCULAR | Status: AC
  Administered 2017-04-16: 25 mg via INTRAVENOUS

## 2017-04-16 MED ORDER — FAMOTIDINE IN NACL 20-0.9 MG/50ML-% IV SOLN
INTRAVENOUS | Status: AC
  Filled 2017-04-16: qty 50

## 2017-04-16 MED ORDER — PALONOSETRON HCL INJECTION 0.25 MG/5ML
INTRAVENOUS | Status: AC
  Filled 2017-04-16: qty 5

## 2017-04-16 MED ORDER — DIPHENHYDRAMINE HCL 50 MG/ML IJ SOLN
INTRAMUSCULAR | Status: AC
  Filled 2017-04-16: qty 1

## 2017-04-16 MED ORDER — FAMOTIDINE IN NACL 20-0.9 MG/50ML-% IV SOLN
20.0000 mg | Freq: Once | INTRAVENOUS | Status: AC
  Administered 2017-04-16: 20 mg via INTRAVENOUS

## 2017-04-16 MED ORDER — SODIUM CHLORIDE 0.9% FLUSH
10.0000 mL | INTRAVENOUS | Status: DC | PRN
  Filled 2017-04-16: qty 10

## 2017-04-16 MED ORDER — DEXAMETHASONE SODIUM PHOSPHATE 10 MG/ML IJ SOLN
10.0000 mg | Freq: Once | INTRAMUSCULAR | Status: AC
  Administered 2017-04-16: 10 mg via INTRAVENOUS

## 2017-04-16 MED ORDER — SODIUM CHLORIDE 0.9% FLUSH
10.0000 mL | INTRAVENOUS | Status: DC | PRN
  Administered 2017-04-16 (×2): 10 mL via INTRAVENOUS
  Filled 2017-04-16: qty 10

## 2017-04-16 MED ORDER — PEGFILGRASTIM 6 MG/0.6ML ~~LOC~~ PSKT
6.0000 mg | PREFILLED_SYRINGE | Freq: Once | SUBCUTANEOUS | Status: AC
  Administered 2017-04-16: 6 mg via SUBCUTANEOUS
  Filled 2017-04-16: qty 0.6

## 2017-04-16 MED ORDER — PALONOSETRON HCL INJECTION 0.25 MG/5ML
0.2500 mg | Freq: Once | INTRAVENOUS | Status: AC
  Administered 2017-04-16: 0.25 mg via INTRAVENOUS

## 2017-04-16 MED ORDER — SODIUM CHLORIDE 0.9 % IV SOLN
Freq: Once | INTRAVENOUS | Status: AC
  Administered 2017-04-16: 13:00:00 via INTRAVENOUS

## 2017-04-16 NOTE — Patient Instructions (Signed)
[facility] Discharge Instructions for Patients Receiving Chemotherapy  Today you received the following chemotherapy agents Taxol and Carboplatin  To help prevent nausea and vomiting after your treatment, we encourage you to take your nausea medication as directed. No Zofran for 3 days. Take Compazine instead.   If you develop nausea and vomiting that is not controlled by your nausea medication, call the clinic.   BELOW ARE SYMPTOMS THAT SHOULD BE REPORTED IMMEDIATELY:  *FEVER GREATER THAN 100.5 F  *CHILLS WITH OR WITHOUT FEVER  NAUSEA AND VOMITING THAT IS NOT CONTROLLED WITH YOUR NAUSEA MEDICATION  *UNUSUAL SHORTNESS OF BREATH  *UNUSUAL BRUISING OR BLEEDING  TENDERNESS IN MOUTH AND THROAT WITH OR WITHOUT PRESENCE OF ULCERS  *URINARY PROBLEMS  *BOWEL PROBLEMS  UNUSUAL RASH Items with * indicate a potential emergency and should be followed up as soon as possible.  Feel free to call the clinic you have any questions or concerns. The clinic phone number is [phone].  Please show the CHEMO ALERT CARD at check-in to the Emergency Department and triage nurse.   

## 2017-04-16 NOTE — Telephone Encounter (Signed)
Telephone call to central scheduling. Patient has been scheduled for scans on July 2. Writer brought patient contrast, schedule and written directions to the infusion room. Patient and daughter verbalized an understanding and will call this office with any questions or concerns in the interm.

## 2017-04-16 NOTE — Progress Notes (Signed)
  [place] OFFICE PROGRESS NOTE   Diagnosis:  GE junction cancer  INTERVAL HISTORY:   [name] returns as scheduled. He continues completed another treatment with every 2 week carboplatin/Taxol 04/03/2017. He denies nausea/vomiting. No mouth sores. No diarrhea. No change in baseline neuropathy symptoms which predated chemotherapy. He has stable arthralgias. He attributes this to arthritis. He has a good appetite. No dysphagia or odynophagia.  Objective:  Vital signs in last 24 hours:  Blood pressure 112/64, pulse (!) 108, temperature 98.4 F (36.9 C), temperature source Oral, resp. rate 18, height 5\' 8"  (1.727 m), weight 274 lb 4.8 oz (124.4 kg), SpO2 99 %.    HEENT: No thrush or ulcers. Resp: Lungs clear bilaterally. Cardio: Regular rate and rhythm. GI: Abdomen soft and nontender. Vascular: No leg edema. Port-A-Cath without erythema.  Lab Results:  Lab Results  Component Value Date   WBC 8.8 04/16/2017   HGB 9.9 (L) 04/16/2017   HCT 32.5 (L) 04/16/2017   MCV 96.7 04/16/2017   PLT 95 (L) 04/16/2017   NEUTROABS 7.1 (H) 04/16/2017    Imaging:  No results found.  Medications: I have reviewed the patient's current medications.  Assessment/Plan: 1. Cancer of cardio-esophageal junction, poorly differentiated, cTxN2M1 with node metastasis, stage IV currently on active treatment with carboplatin and Taxol every 2 weeks. 2. CAD, diastolic CHF, on continuous oxygen 3. Diabetes, hypertension 4. OSA, morbid obesity 5. Arthralgias 6. History of pancytopenia   Disposition: [name] appears stable. Plan to continue every 2 week carboplatin/Taxol.  I reviewed today's CBC with Dr. Benay Spice. Platelet count overall is stable. Bleeding precautions reviewed with [name] and his daughter. They understand to call the office with spontaneous bruising, bleeding.  He is scheduled to return for a follow-up visit and chemotherapy 05/02/2017. He will contact the  office in the interim as outlined above or with any other problems.  Plan reviewed with Dr. Benay Spice.    Ned Card ANP/GNP-BC   04/16/2017  11:13 AM

## 2017-04-16 NOTE — Patient Instructions (Signed)
Implanted Port Home Guide An implanted port is a type of central line that is placed under the skin. Central lines are used to provide IV access when treatment or nutrition needs to be given through a person's veins. Implanted ports are used for long-term IV access. An implanted port may be placed because:  You need IV medicine that would be irritating to the small veins in your hands or arms.  You need long-term IV medicines, such as antibiotics.  You need IV nutrition for a long period.  You need frequent blood draws for lab tests.  You need dialysis.  Implanted ports are usually placed in the chest area, but they can also be placed in the upper arm, the abdomen, or the leg. An implanted port has two main parts:  Reservoir. The reservoir is round and will appear as a small, raised area under your skin. The reservoir is the part where a needle is inserted to give medicines or draw blood.  Catheter. The catheter is a thin, flexible tube that extends from the reservoir. The catheter is placed into a large vein. Medicine that is inserted into the reservoir goes into the catheter and then into the vein.  How will I care for my incision site? Do not get the incision site wet. Bathe or shower as directed by your health care provider. How is my port accessed? Special steps must be taken to access the port:  Before the port is accessed, a numbing cream can be placed on the skin. This helps numb the skin over the port site.  Your health care provider uses a sterile technique to access the port. ? Your health care provider must put on a mask and sterile gloves. ? The skin over your port is cleaned carefully with an antiseptic and allowed to dry. ? The port is gently pinched between sterile gloves, and a needle is inserted into the port.  Only "non-coring" port needles should be used to access the port. Once the port is accessed, a blood return should be checked. This helps ensure that the port  is in the vein and is not clogged.  If your port needs to remain accessed for a constant infusion, a clear (transparent) bandage will be placed over the needle site. The bandage and needle will need to be changed every week, or as directed by your health care provider.  Keep the bandage covering the needle clean and dry. Do not get it wet. Follow your health care provider's instructions on how to take a shower or bath while the port is accessed.  If your port does not need to stay accessed, no bandage is needed over the port.  What is flushing? Flushing helps keep the port from getting clogged. Follow your health care provider's instructions on how and when to flush the port. Ports are usually flushed with saline solution or a medicine called heparin. The need for flushing will depend on how the port is used.  If the port is used for intermittent medicines or blood draws, the port will need to be flushed: ? After medicines have been given. ? After blood has been drawn. ? As part of routine maintenance.  If a constant infusion is running, the port may not need to be flushed.  How long will my port stay implanted? The port can stay in for as long as your health care provider thinks it is needed. When it is time for the port to come out, surgery will be   done to remove it. The procedure is similar to the one performed when the port was put in. When should I seek immediate medical care? When you have an implanted port, you should seek immediate medical care if:  You notice a bad smell coming from the incision site.  You have swelling, redness, or drainage at the incision site.  You have more swelling or pain at the port site or the surrounding area.  You have a fever that is not controlled with medicine.  This information is not intended to replace advice given to you by your health care provider. Make sure you discuss any questions you have with your health care provider. Document  Released: 10/15/2005 Document Revised: 03/22/2016 Document Reviewed: 06/22/2013 Elsevier Interactive Patient Education  2017 Elsevier Inc.  

## 2017-04-16 NOTE — Telephone Encounter (Signed)
-----   Message from Loletta Parish sent at 04/16/2017 12:29 PM EDT ----- Regarding: RE: Scans Authorized. ----- Message ----- From: Belva Chimes, LPN Sent: 03/08/210  12:00 PM To: Loletta Parish Subject: Scans                                          Hi Elmyra Ricks, This patient is supposed to have CT abd/pelvis and chest completed prior to his next visit with Dr. Burr Medico on 7/5. These were ordered on 6/6- patient here today asking about the schedule for them. Can you check on the authorization please?  Thanks for everything today! Clarise Cruz

## 2017-04-16 NOTE — Progress Notes (Signed)
Pt platelets 95. Okay to treat today per Marlynn Perking.

## 2017-04-17 ENCOUNTER — Ambulatory Visit: Payer: Medicare Other

## 2017-04-17 ENCOUNTER — Other Ambulatory Visit: Payer: Medicare Other

## 2017-04-25 ENCOUNTER — Encounter: Payer: Self-pay | Admitting: Family Medicine

## 2017-04-25 ENCOUNTER — Ambulatory Visit (INDEPENDENT_AMBULATORY_CARE_PROVIDER_SITE_OTHER): Payer: Medicare Other | Admitting: Family Medicine

## 2017-04-25 VITALS — BP 106/64 | HR 98 | Temp 97.7°F | Resp 16 | Ht 68.0 in | Wt 274.8 lb

## 2017-04-25 DIAGNOSIS — E118 Type 2 diabetes mellitus with unspecified complications: Secondary | ICD-10-CM

## 2017-04-25 DIAGNOSIS — Z0000 Encounter for general adult medical examination without abnormal findings: Secondary | ICD-10-CM

## 2017-04-25 DIAGNOSIS — Z794 Long term (current) use of insulin: Secondary | ICD-10-CM | POA: Diagnosis not present

## 2017-04-25 LAB — POCT GLYCOSYLATED HEMOGLOBIN (HGB A1C): Hemoglobin A1C: 6.3

## 2017-04-25 MED ORDER — INSULIN ISOPHANE HUMAN 100 UNIT/ML KWIKPEN: PEN_INJECTOR | SUBCUTANEOUS | 3 refills | Status: DC

## 2017-04-25 MED ORDER — INSULIN LISPRO 200 UNIT/ML ~~LOC~~ SOPN: 20.0000 [IU] | PEN_INJECTOR | Freq: Two times a day (BID) | SUBCUTANEOUS | 6 refills | Status: DC

## 2017-04-25 NOTE — Progress Notes (Signed)
Office Note 04/25/2017  CC:  Chief Complaint  Patient presents with  . Annual Exam    Pt is fasting.     HPI:  [name] is a 71 y.o. White male who is here for annual health maintenance exam. Says he is tolerating chemo well lately.   Next surveillance CT is next week.  DM 2: Taking 15-17 NPH; takes 20 humalog with each meal: glucoses 150-200s.   Past Medical History:  Diagnosis Date  . Asthma    as a child  . Cataract    multiple types, bilateral  . Cholelithiasis without cholecystitis   . Chronic diastolic CHF (congestive heart failure) ([place]) 02/25/2009  . Chronic renal insufficiency, stage III (moderate) 2017   Stage II/III (GFR around 60)  . 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 COPD (chronic obstructive pulmonary disease) ([place])   . Coronary artery disease    chronically occluded LAD and diagonal with right to left collaterals, mild disease in the left circ and moderate disease in the mid RCA on medical management with Imdur, ASA, and Plavix.  . Dermatitis 05/2014  . DIABETES MELLITUS, TYPE II 06/24/2007   No diab retpthy as of 08/05/15 eye exam.  . Esophageal cancer ([place]) 04/2016   poorly differentiated carcinoma (Dr. Pyrtle--EGD).  CT C/A/P showed metastatic adenopathy in mediastinum and upper abdomen 05/09/16.  Tx plan is palliative radiation (completed 06/22/16), then palliative systemic chemotherapy (carbo+taxol) was started but as of 08/03/16 onc f/u this was held due to severe knee and ankle arthralgias.  Restarting as of 10/2015 (08/2016 CT showed some disease regression  . Esophageal cancer ([place])    CT 12/2016 showed no residual esoph mass--plan to continue chemo.  Ongoing palliative chemo with carboplatin and taxol q 2 wks as of 03/2017 onc f/u (CT 12/2016 showed no progression of dz).  . Essential hypertension 05/01/2007   Qualifier: Diagnosis of  By: Tiney Rouge CMA, Ellison Hughs     . History of kidney  stones   . Hyperkalemia 12/2015   Decreased ACE-I by 50% in response, then potassium normalized.  Marland Kitchen HYPERLIPIDEMIA 06/24/2007  . Hypoxemia 01/27/2010  . Myocardial infarction [facility])    pt states he was informed per MD that he has had one but pt was unaware   . OBESITY 09/02/2008  . Obesity hypoventilation syndrome (HCC)    oxygen 24/7 (2 liters [place] as of 02/2016)  . On home oxygen therapy    Oxygen @ 2l/m nasally 24/7 hours  . OSA (obstructive sleep apnea)    not tested; pt scored 4 per stop bang tool results sent to PCP   . OSTEOARTHRITIS 05/01/2007  . Pancytopenia due to chemotherapy ([place]) 2018  . Presbycusis of both ears 05/2015   [facility]  . Pruritic condition 05/2014   Allergist summer 2015, no new testing.  [name] field defect 05/10/16   Per Dr. Melina Fiddler, O.D.: Rt, loss of inf/temp quad and some loss sup/temp.  ?CVA  ? Pituitary tumor? ?Brain met.    Past Surgical History:  Procedure Laterality Date  . APPENDECTOMY    . BALLOON DILATION N/A 05/04/2016   Procedure: BALLOON DILATION;  Surgeon: Jerene Bears, MD;  Location: [facility] END[facility];  Service: Gastroenterology;  Laterality: N/A;  . CARDIAC CATHETERIZATION    . CARPAL TUNNEL RELEASE Left   . CATARACT EXTRACTION W/PHACO Right 04/29/2013   Procedure: CATARACT EXTRACTION PHACO AND INTRAOCULAR LENS PLACEMENT (  [place]);  Surgeon: Adonis Brook, MD;  Location: [facility];  Service: Ophthalmology;  Laterality: Right;  . CATARACT EXTRACTION, BILATERAL    . COLONOSCOPY W/ POLYPECTOMY  08/2011   Many polyps--all hyperplastic, severe diverticulosis, int hem.  [employer] hemoccult testing via [facility] 06/22/15 NEG  . ESOPHAGOGASTRODUODENOSCOPY N/A 05/04/2016   Procedure: ESOPHAGOGASTRODUODENOSCOPY (EGD);  Surgeon: Jerene Bears, MD;  Location: [facility];  Service: Gastroenterology;  Laterality: N/A;  . IR GENERIC HISTORICAL  10/03/2016   IR US GUIDE VASC ACCESS RIGHT 10/03/2016 Greggory Keen, MD [facility]-INTER[facility]  . IR GENERIC HISTORICAL  10/03/2016   IR FLUORO GUIDE  PORT INSERTION RIGHT 10/03/2016 Greggory Keen, MD [facility]  . KNEE ARTHROSCOPY WITH MEDIAL MENISECTOMY Right 12/16/2014   Procedure: RIGHT KNEE ARTHROSCOPY WITH MEDIAL MENISECTOMY microfracture medial femoral condyle abrasion condroplasty medial femoral condyle lateral menisectomy;  Surgeon: Tobi Bastos, MD;  Location: [facility] OR[facility];  Service: Orthopedics;  Laterality: Right;  . LEFT HEART CATHETERIZATION WITH CORONARY ANGIOGRAM N/A 10/26/2013   Procedure: LEFT HEART CATHETERIZATION WITH CORONARY ANGIOGRAM;  Surgeon: Jettie Booze, MD;  Location: [facility];  Service: Cardiovascular;  Laterality: N/A;  . LUMBAR [place] SURGERY  2001  . SHOULDER SURGERY Right    right fx  . TONSILLECTOMY    . TRANSTHORACIC ECHOCARDIOGRAM  03/2016   EF 55-60%, grade I DD, LAE  . WRIST SURGERY Right    right fx    Family History  Problem Relation Age of Onset  . Heart attack Mother   . Aneurysm Father   . Alcohol abuse Father   . Diabetes Maternal Aunt   . Colon cancer Neg Hx     Social History   Social History  . Marital status: Married    Spouse name: susan  . Number of children: 2  . Years of education: N/A   Occupational History  . Retired    Social History Main Topics  . Smoking status: Former Smoker    Packs/day: 1.50    Years: 30.00    Types: Cigarettes, Pipe, Cigars    Quit date: 10/30/1983  . Smokeless tobacco: Former Systems developer    Types: Chew    Quit date: 05/15/2016  . Alcohol use No  . Drug use: No  . Sexual activity: Not Currently   Other Topics Concern  . Not on file   Social History Narrative   Married, one son and one daughter.   His daughter and her two children live with him.   Coffee daily.  Former smoker.  No alcohol.   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   Attends church weekly-   Oxygen continuous       Outpatient Medications Prior to Visit  Medication Sig Dispense Refill  . aspirin 81 MG tablet Take 81 mg by mouth daily.       Marland Kitchen atorvastatin (LIPITOR) 80 MG tablet TAKE ONE TABLET BY MOUTH ONCE DAILY 90 tablet 2  . beta carotene w/minerals (OCUVITE) tablet Take 1 tablet by mouth daily.    . cetirizine (ZYRTEC) 10 MG tablet Take 10 mg by mouth daily.    . citalopram (CELEXA) 20 MG tablet TAKE ONE TABLET BY MOUTH ONCE DAILY 90 tablet 1  . clonazePAM (KLONOPIN) 1 MG tablet TAKE ONE TABLET BY MOUTH TWICE DAILY AS NEEDED FOR ANXIETY 60 tablet 5  . cromolyn (OPTICROM) 4 % ophthalmic solution Place 1 drop into both eyes 4 (four) times daily.    Marland Kitchen  ezetimibe (ZETIA) 10 MG tablet Take 1 tablet (10 mg total) by mouth daily. 90 tablet 3  . gabapentin (NEURONTIN) 300 MG capsule TAKE ONE CAPSULE BY MOUTH THREE TIMES DAILY 270 capsule 3  . hydrOXYzine (ATARAX/VISTARIL) 25 MG tablet TAKE 1 TABLET BY MOUTH AT SUPPER AND 1 TABLET BY MOUTH AT BEDTIME FOR INSOMNIA 60 tablet 6  . magnesium chloride (SLOW-MAG) 64 MG TBEC SR tablet Take 1 tablet (64 mg total) by mouth daily. 60 tablet 0  . metFORMIN (GLUCOPHAGE) 1000 MG tablet TAKE ONE TABLET BY MOUTH TWICE DAILY WITH MEALS 180 tablet 1  . Omega-3 Fatty Acids ([employer] FISH OIL) 1000 MG CAPS Take 2 tablets by mouth 2 (two) times daily. 90 capsule   . ondansetron (ZOFRAN) 8 MG tablet Take 1 tablet (8 mg total) by mouth 2 (two) times daily as needed for refractory nausea / vomiting. Start on day 3 after chemo. 30 tablet 1  . ONE TOUCH ULTRA TEST test strip     . ONE TOUCH ULTRA TEST test strip USE  STRIP TO CHECK GLUCOSE THREE TIMES DAILY AS  DIRECTED 100 each 11  . ONETOUCH DELICA LANCETS 11H MISC USE ONE  TO CHECK GLUCOSE THREE TIMES DAILY 100 each 11  . oxyCODONE (OXY IR/ROXICODONE) 5 MG immediate release tablet Take 1 tablet (5 mg total) by mouth every 8 (eight) hours as needed for severe pain. 30 tablet 0  . OXYGEN Inhale 2 L/min into the lungs continuous.    . pantoprazole (PROTONIX) 40 MG tablet TAKE ONE TABLET BY MOUTH TWICE DAILY 60 tablet 3  . prochlorperazine (COMPAZINE) 10 MG tablet  Take 1 tablet (10 mg total) by mouth every 6 (six) hours as needed (Nausea or vomiting). 30 tablet 1  . HUMALOG KWIKPEN 200 UNIT/ML SOPN INJECT 24 UNITS SUBCUTANEOUSLY THREE TIMES DAILY BEFORE MEAL(S) 18 pen 6  . insulin lispro (HUMALOG KWIKPEN) 100 UNIT/ML KiwkPen Inject into the skin. SS at meal time    . Insulin NPH, Human,, Isophane, (HUMULIN N) 100 UNIT/ML Kiwkpen 16 units every morning and 16 units every evening 15 mL 3   Facility-Administered Medications Prior to Visit  Medication Dose Route Frequency Provider Last Rate Last Dose  . sodium chloride flush (NS) 0.9 % injection 10 mL  10 mL Intravenous PRN Truitt Merle, MD   10 mL at 04/16/17 1459    No Known Allergies  ROS Review of Systems  Constitutional: Positive for fatigue (chronic). Negative for appetite change, chills and fever.  HENT: Negative for congestion, dental problem, ear pain and sore throat.   Eyes: Negative for discharge, redness and visual disturbance.  Respiratory: Positive for shortness of breath (chronic--with normal ambulation). Negative for cough, chest tightness and wheezing.   Cardiovascular: Negative for chest pain, palpitations and leg swelling.  Gastrointestinal: Negative for abdominal pain, blood in stool, diarrhea, nausea and vomiting.  Genitourinary: Negative for difficulty urinating, dysuria, flank pain, frequency, hematuria and urgency.  Musculoskeletal: Negative for arthralgias, back pain, joint swelling, myalgias and neck stiffness.  Skin: Negative for pallor and rash.  Neurological: Negative for dizziness, speech difficulty and headaches. Weakness: generalized--chronic.  Hematological: Negative for adenopathy. Does not bruise/bleed easily.  Psychiatric/Behavioral: Negative for confusion and sleep disturbance. The patient is not nervous/anxious.     PE; Blood pressure 106/64, pulse 98, temperature 97.7 F (36.5 C), temperature source Oral, resp. rate 16, height '5\' 8"'  (1.727 m), weight 274 lb 12 oz  (124.6 kg), SpO2 98 %. 2L oxygen.  Body mass index  is 41.78 kg/m.  Gen: Alert, chronically ill-appearing.  Patient is oriented to person, place, time, and situation. AFFECT: pleasant, lucid thought and speech. ENT: Ears: EACs clear, normal epithelium.  TMs with good light reflex and landmarks bilaterally.  Eyes: no injection, icteris, swelling, or exudate.  EOMI, PERRLA. Nose: no drainage or turbinate edema/swelling.  No injection or focal lesion.  Mouth: lips without lesion/swelling.  Oral mucosa pink and moist.  Dentition intact and without obvious caries or gingival swelling.  Oropharynx without erythema, exudate, or swelling.  Neck - No masses or thyromegaly or limitation in range of motion CV: RRR, no m/r/g.   LUNGS: CTA bilat, nonlabored resps, good aeration in all lung fields. ABD: soft, NT, ND, BS normal.  No hepatospenomegaly or mass.  No bruits. EXT: no clubbing or cyanosis.  1+ pitting edema from knees down into ankles, with some brawny skin changes in pretibial regions bilat.  Pertinent labs:  Lab Results  Component Value Date   TSH 2.940 08/28/2016   Lab Results  Component Value Date   WBC 8.8 04/16/2017   HGB 9.9 (L) 04/16/2017   HCT 32.5 (L) 04/16/2017   MCV 96.7 04/16/2017   PLT 95 (L) 04/16/2017   Lab Results  Component Value Date   CREATININE 0.8 04/16/2017   BUN 14.2 04/16/2017   NA 141 04/16/2017   K 3.8 04/16/2017   CL 97 11/28/2016   CO2 26 04/16/2017   Lab Results  Component Value Date   ALT 45 04/16/2017   AST 43 (H) 04/16/2017   ALKPHOS 91 04/16/2017   BILITOT 0.91 04/16/2017   Lab Results  Component Value Date   CHOL 96 (L) 11/28/2016   Lab Results  Component Value Date   HDL 37 (L) 11/28/2016   Lab Results  Component Value Date   LDLCALC 25 11/28/2016   Lab Results  Component Value Date   TRIG 168 (H) 11/28/2016   Lab Results  Component Value Date   CHOLHDL 2.6 11/28/2016   Lab Results  Component Value Date   HGBA1C 6.3  04/25/2017   POC A1c today: 6.3%  ASSESSMENT AND PLAN:   Health maintenance exam: Reviewed age and gender appropriate health maintenance issues (prudent diet, regular exercise, health risks of tobacco and excessive alcohol, use of seatbelts, fire alarms in home, use of sunscreen).  Also reviewed age and gender appropriate health screening as well as vaccine recommendations. Vaccines: UTD.  Discussed shingrix today: gave rx but will get his oncologist's consent before having him get this at his pharmacy. Labs: UTD; POC A1c today=6.3%. Colon and prostate ca screening: not appropriate given pt's diagnosis of stage IV esophageal cancer and life expectancy significantly limited.  An After Visit Summary was printed and given to the patient.  FOLLOW UP:  Return in about 3 months (around 07/26/2017) for routine chronic illness f/u.  Signed:  Crissie Sickles, MD           04/25/2017

## 2017-04-29 ENCOUNTER — Ambulatory Visit (HOSPITAL_COMMUNITY)
Admission: RE | Admit: 2017-04-29 | Discharge: 2017-04-29 | Disposition: A | Discharge status: Home / Self Care | Payer: Medicare Other | Source: Ambulatory Visit | Attending: Hematology | Admitting: Hematology

## 2017-04-29 ENCOUNTER — Encounter (HOSPITAL_COMMUNITY): Payer: Self-pay

## 2017-04-29 DIAGNOSIS — I2510 Atherosclerotic heart disease of native coronary artery without angina pectoris: Secondary | ICD-10-CM | POA: Insufficient documentation

## 2017-04-29 DIAGNOSIS — M19011 Primary osteoarthritis, right shoulder: Secondary | ICD-10-CM | POA: Diagnosis not present

## 2017-04-29 DIAGNOSIS — R161 Splenomegaly, not elsewhere classified: Secondary | ICD-10-CM | POA: Insufficient documentation

## 2017-04-29 DIAGNOSIS — C159 Malignant neoplasm of esophagus, unspecified: Secondary | ICD-10-CM | POA: Diagnosis not present

## 2017-04-29 DIAGNOSIS — J9811 Atelectasis: Secondary | ICD-10-CM | POA: Insufficient documentation

## 2017-04-29 DIAGNOSIS — C160 Malignant neoplasm of cardia: Secondary | ICD-10-CM

## 2017-04-29 DIAGNOSIS — I700 Atherosclerosis of aorta: Secondary | ICD-10-CM | POA: Diagnosis not present

## 2017-04-29 DIAGNOSIS — K7460 Unspecified cirrhosis of liver: Secondary | ICD-10-CM | POA: Insufficient documentation

## 2017-04-29 DIAGNOSIS — J90 Pleural effusion, not elsewhere classified: Secondary | ICD-10-CM | POA: Diagnosis not present

## 2017-04-29 MED ORDER — IOPAMIDOL (ISOVUE-300) INJECTION 61%
INTRAVENOUS | Status: AC
  Administered 2017-04-29: 100 mL via INTRAVENOUS
  Filled 2017-04-29: qty 100

## 2017-04-29 MED ORDER — HEPARIN SOD (PORK) LOCK FLUSH 100 UNIT/ML IV SOLN
INTRAVENOUS | Status: AC
  Administered 2017-04-29: 500 [IU]
  Filled 2017-04-29: qty 5

## 2017-04-29 MED ORDER — IOPAMIDOL (ISOVUE-300) INJECTION 61%
100.0000 mL | Freq: Once | INTRAVENOUS | Status: AC | PRN
  Administered 2017-04-29: 100 mL via INTRAVENOUS

## 2017-04-30 NOTE — Progress Notes (Signed)
[place]  [phone] [phone]  Clinic Follow up Note   Patient Care Team: Tammi Sou, MD as PCP - General (Family Medicine) Harold Hedge, Darrick Grinder, MD as Consulting Physician (Allergy and Immunology) Shirley Muscat Loreen Freud, MD as Consulting Physician (Optometry) Sueanne Margarita, MD as Consulting Physician (Cardiology) Pyrtle, Lajuan Lines, MD as Consulting Physician (Gastroenterology) Latanya Maudlin, MD as Consulting Physician (Orthopedic Surgery) Jodi Marble, MD as Consulting Physician (Otolaryngology) Truitt Merle, MD as Consulting Physician (Hematology) Kyung Rudd, MD as Consulting Physician (Radiation Oncology) 05/02/2017    CHIEF COMPLAINTS:  Follow up GE junction cancer  Oncology History   Cancer of cardio-esophageal junction [facility])   Staging form: Stomach, AJCC 7th Edition     Clinical stage from 05/04/2016: Stage IV ([place], N2, M1) - Signed by Truitt Merle, MD on 05/18/2016        Cancer of cardio-esophageal junction ([place])   05/04/2016 Initial Diagnosis    Cancer of cardio-esophageal junction ([place])      05/04/2016 Procedure    EGD showed a large ulcerating mass with no active bleeding at the gastroesophageal junction extending into the gastric cardia. The mass was not obstructing and partially circumferential, extends approximately 5 cm. Nonbleeding erosive gastropathy.      05/04/2016 Initial Biopsy    Esophageal gastric junction biopsy showed a poorly differentiated carcinoma underlying the squamous mucosa. There is lymphovascular invasion. No Intestinal metaplasia. IHC weakly positive CK5/6, p63 (-), favor squamous.        05/09/2016 Imaging    CT CAP w contrast showed mild wall thickening involving the distal esophagus and proximal stomach compatible with known cancer, enlarged mediastinal and upper abdominal lymph nodes are highly suspicious for metastatic adenopathy, propable liver cirrhosis.      06/04/2016 - 06/22/2016 Radiation Therapy     palliative radiation to esophageal cancer       07/06/2016 -  Chemotherapy    chemotherapy with weekly carboplatin and taxol, started on 07/06/2016, held 08/21/16-09/28/2016 due to hospitalization, changed to every 2 weeks from 11/30/2016      07/30/2016 Pathology Results    PD-L1 negative expression      08/28/2016 - 08/31/2016 Hospital Admission    The patient was admitted to 4 severe hypoglycemia, dehydration, and acute renal failure. Infection workup was negative. He recovered well with supportive care. His insulin and hypertension medication was held on discharge, except insulin sliding scale.      09/24/2016 Imaging    CT CAP W CONTRAST 09/26/2016 IMPRESSION: Decreased masslike soft tissue prominence at gastroesophageal junction, consistent with decreased size of primary gastroesophageal junction carcinoma. Resolution of paraesophageal and gastrohepatic ligament lymphadenopathy since prior exam. Other sub-cm mediastinal lymph nodes show little or no significant change. Stable indeterminate sub-cm low-attenuation lesion in the liver dome. Probable hepatic cirrhosis. Recommend continued attention on follow-up CT. No new or progressive metastatic disease identified. Cholelithiasis.  No radiographic evidence of cholecystitis. Colonic diverticulosis. No radiographic evidence of diverticulitis. Aortic atherosclerosis and three-vessel coronary artery calcification.      01/07/2017 Imaging    CT CAP w contrast 1. No residual esophageal mass or adenopathy. 2. New nodular airspace opacification in the left lower lobe. Continued attention on followup exams is warranted as metastatic disease cannot be definitively excluded. 3. Cirrhosis. 4. Trace left pleural effusion. 5. Aortic atherosclerosis (ICD10-170.0). Coronary artery calcification. 6. Cholelithiasis. 7. Mild basilar predominant subpleural reticular densities. Difficult to exclude interstitial lung disease.      04/29/2017  Imaging  CT CAP w contrast  IMPRESSION: 1. No evidence of metastatic disease. 2. Nodular airspace disease previously seen in the left lower lobe has resolved in the interval. 3. Tiny right pleural effusion, new. Small left pleural effusion, slightly increased. 4. Cirrhosis with splenomegaly. 5.  Aortic atherosclerosis (ICD10-170.0).       HISTORY OF PRESENTING ILLNESS:  [name] 72 y.o. male is here because of His reason that diagnosed GE junction carcinoma. He is a comment of by his wife and daughter to our multidisciplinary chart clinic today.  He has been having progressive dysphagia and odynophagia for 2 month, he has been eating soft diet only in the past one week. He has pain in the mid chest and epigastric area only when he eats, with burning sensation, no nausea, abdominal bloating, or other discomfort. His appetite has remained well, no weight loss,   He has multiple medical problems, especially heart failure, he has been on oxygen continuously for 5-6 years. He is morbidly obese, has diabetes, hypertension, mild neuropathy, OSA etc. He has very sedentary life style, he sits in the chair and watch TV most of time during the day. He only comes out for shopping for a few times a week. He uses a Barr[employer], he can walk for a few hundrends feet before he has to stop to catch his breasts. He denies cough or sputum production, no GI discomfort, he has been having mild dirrhea, loose stool, twice daily, no melena or hematochezia.  CURRENT THERAPY: chemotherapy with weekly carboplatin and taxol, started on 07/06/2016, held 08/21/16-09/28/2016 due to hospitalization, changed to every 2 weeks from 11/30/2016; Due to moderate some cytopenia, I'll reduce carboplatin AUC to 1.0, continue Taxol 70 mg/m, continue chemotherapy every 2 weeks (03/06/17). Add Onpro patch starting 04/08/17   INTERIM HISTORY:  Mr. Bihm returns for follow-up and discuss results of CT Scan. He has been doing  well. No changes since  Our last visit.  He denies nausea. His appetite is normal. His previous leg cramps are resolved now. He has been experiencing some flushing and redness on his face the day after chemo, Which is not bothersome and resolved spontaneously. No other rash.    MEDICAL HISTORY:  Past Medical History:  Diagnosis Date  . Asthma    as a child  . Cataract    multiple types, bilateral  . Cholelithiasis without cholecystitis   . Chronic diastolic CHF (congestive heart failure) ([place]) 02/25/2009  . Chronic renal insufficiency, stage III (moderate) 2017   Stage II/III (GFR around 60)  . 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 COPD (chronic obstructive pulmonary disease) ([place])   . Coronary artery disease    chronically occluded LAD and diagonal with right to left collaterals, mild disease in the left circ and moderate disease in the mid RCA on medical management with Imdur, ASA, and Plavix.  . Dermatitis 05/2014  . DIABETES MELLITUS, TYPE II 06/24/2007   No diab retpthy as of 08/05/15 eye exam.  . Esophageal cancer ([place]) 04/2016   poorly differentiated carcinoma (Dr. Pyrtle--EGD).  CT C/A/P showed metastatic adenopathy in mediastinum and upper abdomen 05/09/16.  Tx plan is palliative radiation (completed 06/22/16), then palliative systemic chemotherapy (carbo+taxol) was started but as of 08/03/16 onc f/u this was held due to severe knee and ankle arthralgias.  Restarting as of 10/2015 (08/2016 CT showed some disease regression  . Esophageal cancer ([place])  CT 12/2016 showed no residual esoph mass--plan to continue chemo.  Ongoing palliative chemo with carboplatin and taxol q 2 wks as of 03/2017 onc f/u (CT 12/2016 showed no progression of dz).  . Essential hypertension 05/01/2007   Qualifier: Diagnosis of  By: Tiney Rouge CMA, Ellison Hughs     . History of kidney stones   . Hyperkalemia 12/2015   Decreased ACE-I by 50% in response, then  potassium normalized.  Marland Kitchen HYPERLIPIDEMIA 06/24/2007  . Hypoxemia 01/27/2010  . Myocardial infarction [facility])    pt states he was informed per MD that he has had one but pt was unaware   . OBESITY 09/02/2008  . Obesity hypoventilation syndrome (HCC)    oxygen 24/7 (2 liters [place] as of 02/2016)  . On home oxygen therapy    Oxygen @ 2l/m nasally 24/7 hours  . OSA (obstructive sleep apnea)    not tested; pt scored 4 per stop bang tool results sent to PCP   . OSTEOARTHRITIS 05/01/2007  . Pancytopenia due to chemotherapy ([place]) 2018  . Presbycusis of both ears 05/2015   [facility]  . Pruritic condition 05/2014   Allergist summer 2015, no new testing.  [name] field defect 05/10/16   Per Dr. Melina Fiddler, O.D.: Rt, loss of inf/temp quad and some loss sup/temp.  ?CVA  ? Pituitary tumor? ?Brain met.    SURGICAL HISTORY: Past Surgical History:  Procedure Laterality Date  . APPENDECTOMY    . BALLOON DILATION N/A 05/04/2016   Procedure: BALLOON DILATION;  Surgeon: Jerene Bears, MD;  Location: [facility];  Service: Gastroenterology;  Laterality: N/A;  . CARDIAC CATHETERIZATION    . CARPAL TUNNEL RELEASE Left   . CATARACT EXTRACTION W/PHACO Right 04/29/2013   Procedure: CATARACT EXTRACTION PHACO AND INTRAOCULAR LENS PLACEMENT (IOC);  Surgeon: Adonis Brook, MD;  Location: [place];  Service: Ophthalmology;  Laterality: Right;  . CATARACT EXTRACTION, BILATERAL    . COLONOSCOPY W/ POLYPECTOMY  08/2011   Many polyps--all hyperplastic, severe diverticulosis, int hem.  [employer] hemoccult testing via [facility] 06/22/15 NEG  . ESOPHAGOGASTRODUODENOSCOPY N/A 05/04/2016   Procedure: ESOPHAGOGASTRODUODENOSCOPY (EGD);  Surgeon: Jerene Bears, MD;  Location: [facility];  Service: Gastroenterology;  Laterality: N/A;  . IR GENERIC HISTORICAL  10/03/2016   IR US GUIDE VASC ACCESS RIGHT 10/03/2016 Greggory Keen, MD [facility]  . IR GENERIC HISTORICAL  10/03/2016   IR FLUORO GUIDE PORT INSERTION RIGHT 10/03/2016 Greggory Keen, MD [facility]  . KNEE ARTHROSCOPY WITH MEDIAL MENISECTOMY Right 12/16/2014   Procedure: RIGHT KNEE ARTHROSCOPY WITH MEDIAL MENISECTOMY microfracture medial femoral condyle abrasion condroplasty medial femoral condyle lateral menisectomy;  Surgeon: Tobi Bastos, MD;  Location: [facility];  Service: Orthopedics;  Laterality: Right;  . LEFT HEART CATHETERIZATION WITH CORONARY ANGIOGRAM N/A 10/26/2013   Procedure: LEFT HEART CATHETERIZATION WITH CORONARY ANGIOGRAM;  Surgeon: Jettie Booze, MD;  Location: [facility] C[facility] LAB;  Service: Cardiovascular;  Laterality: N/A;  . LUMBAR [place] SURGERY  2001  . SHOULDER SURGERY Right    right fx  . TONSILLECTOMY    . TRANSTHORACIC ECHOCARDIOGRAM  03/2016   EF 55-60%, grade I DD, LAE  . WRIST SURGERY Right    right fx    SOCIAL HISTORY: Social History   Social History  . Marital status: Married    Spouse name: susan  . Number of children: 2  . Years of education: N/A   Occupational History  . Retired    Social History Main Topics  . Smoking  status: Former Smoker    Packs/day: 1.50    Years: 30.00    Types: Cigarettes, Pipe, Cigars    Quit date: 10/30/1983  . Smokeless tobacco: Former Systems developer    Types: Chew    Quit date: 05/15/2016  . Alcohol use No  . Drug use: No  . Sexual activity: Not Currently   Other Topics Concern  . Not on file   Social History Narrative   Married, one son and one daughter.   His daughter and her two children live with him.   Coffee daily.  Former smoker.  No alcohol.   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   Attends church weekly-   Oxygen continuous       FAMILY HISTORY: Family History  Problem Relation Age of Onset  . Heart attack Mother   . Aneurysm Father   . Alcohol abuse Father   . Diabetes Maternal Aunt   . Colon cancer Neg Hx     ALLERGIES:  has No Known Allergies.  MEDICATIONS:  Current Outpatient Prescriptions  Medication Sig Dispense Refill  . aspirin 81 MG tablet Take  81 mg by mouth daily.      Marland Kitchen atorvastatin (LIPITOR) 80 MG tablet TAKE ONE TABLET BY MOUTH ONCE DAILY 90 tablet 2  . beta carotene w/minerals (OCUVITE) tablet Take 1 tablet by mouth daily.    . cetirizine (ZYRTEC) 10 MG tablet Take 10 mg by mouth daily.    . citalopram (CELEXA) 20 MG tablet TAKE ONE TABLET BY MOUTH ONCE DAILY 90 tablet 1  . clonazePAM (KLONOPIN) 1 MG tablet TAKE ONE TABLET BY MOUTH TWICE DAILY AS NEEDED FOR ANXIETY 60 tablet 5  . cromolyn (OPTICROM) 4 % ophthalmic solution Place 1 drop into both eyes 4 (four) times daily.    Marland Kitchen ezetimibe (ZETIA) 10 MG tablet Take 1 tablet (10 mg total) by mouth daily. 90 tablet 3  . gabapentin (NEURONTIN) 300 MG capsule TAKE ONE CAPSULE BY MOUTH THREE TIMES DAILY 270 capsule 3  . hydrOXYzine (ATARAX/VISTARIL) 25 MG tablet TAKE 1 TABLET BY MOUTH AT SUPPER AND 1 TABLET BY MOUTH AT BEDTIME FOR INSOMNIA 60 tablet 6  . Insulin Lispro (HUMALOG KWIKPEN) 200 UNIT/ML SOPN Inject 20 Units into the skin 2 (two) times daily with a meal. (Patient taking differently: Inject 16 Units into the skin 2 (two) times daily with a meal. ) 18 pen 6  . Insulin NPH, Human,, Isophane, (HUMULIN N) 100 UNIT/ML Kiwkpen 16 units every morning and 16 units every evening 15 mL 3  . magnesium chloride (SLOW-MAG) 64 MG TBEC SR tablet Take 1 tablet (64 mg total) by mouth daily. 60 tablet 0  . metFORMIN (GLUCOPHAGE) 1000 MG tablet TAKE ONE TABLET BY MOUTH TWICE DAILY WITH MEALS 180 tablet 1  . Omega-3 Fatty Acids ([employer] FISH OIL) 1000 MG CAPS Take 2 tablets by mouth 2 (two) times daily. 90 capsule   . ONE TOUCH ULTRA TEST test strip USE  STRIP TO CHECK GLUCOSE THREE TIMES DAILY AS  DIRECTED 100 each 11  . ONETOUCH DELICA LANCETS 76A MISC USE ONE  TO CHECK GLUCOSE THREE TIMES DAILY 100 each 11  . OXYGEN Inhale 2 L/min into the lungs continuous.    . pantoprazole (PROTONIX) 40 MG tablet TAKE ONE TABLET BY MOUTH TWICE DAILY 60 tablet 3  . ondansetron (ZOFRAN) 8 MG tablet Take 1 tablet (8  mg total) by mouth 2 (two) times daily as needed for  refractory nausea / vomiting. Start on day 3 after chemo. (Patient not taking: Reported on 05/02/2017) 30 tablet 1  . oxyCODONE (OXY IR/ROXICODONE) 5 MG immediate release tablet Take 1 tablet (5 mg total) by mouth every 8 (eight) hours as needed for severe pain. (Patient not taking: Reported on 05/02/2017) 30 tablet 0  . prochlorperazine (COMPAZINE) 10 MG tablet Take 1 tablet (10 mg total) by mouth every 6 (six) hours as needed (Nausea or vomiting). (Patient not taking: Reported on 05/02/2017) 30 tablet 1   No current facility-administered medications for this visit.    Facility-Administered Medications Ordered in Other Visits  Medication Dose Route Frequency Provider Last Rate Last Dose  . sodium chloride flush (NS) 0.9 % injection 10 mL  10 mL Intravenous PRN Truitt Merle, MD   10 mL at 04/16/17 1459  . sodium chloride flush (NS) 0.9 % injection 10 mL  10 mL Intravenous PRN Truitt Merle, MD   10 mL at 05/02/17 1725    REVIEW OF SYSTEMS:  Constitutional: Denies fevers, abnormal night sweats  Eyes: Denies blurriness of vision, double vision or watery eyes Ears, nose, mouth, throat, and face: Denies mucositis or sore throat, Respiratory: Denies cough, dyspnea or wheezes Cardiovascular: Denies palpitation, chest discomfort or lower extremity swelling Gastrointestinal:  Denies nausea, heartburn or change in bowel habits  Skin: Denies abnormal skin rashes (+)skin flushing Lymphatics: Denies new lymphadenopathy or easy bruising Neurological:Denies numbness, tingling   Behavioral/Psych: Mood is stable, no new changes  All other systems were reviewed with the patient and are negative.  PHYSICAL EXAMINATION:  ECOG PERFORMANCE STATUS: 3 - Symptomatic, >50% confined to bed  Vitals:   05/02/17 1308  BP: 126/65  Pulse: 85  Resp: 18  Temp: 98.6 F (37 C)   Filed Weights   05/02/17 1308  Weight: 279 lb 11.2 oz (126.9 kg)     GENERAL:alert, no  distress and comfortable, morbid obese, sitting in wheelchair with nasal cannula oxygen  SKIN: skin color, texture, turgor are normal, no rashes or significant lesions EYES: normal, conjunctiva are pink and non-injected, sclera clear OROPHARYNX:no exudate, no erythema and lips, buccal mucosa, and tongue normal  NECK: supple, thyroid normal size, non-tender, without nodularity LYMPH:  no palpable lymphadenopathy in the cervical, axillary or inguinal LUNGS: clear, (+) rales on bilateral lung base HEART: regular rate & rhythm and no murmurs and no lower extremity edema ABDOMEN:abdomen soft, non-tender and normal bowel sounds Musculoskeletal:no cyanosis of digits and no clubbing  PSYCH: alert & oriented x 3 with fluent speech NEURO: no focal motor/sensory deficits EXT: Trace edema, he wears compression stocks, no significant ankle swollen, erythema, or warmness.   LABORATORY DATA:  I have reviewed the data as listed CBC Latest Ref Rng & Units 05/02/2017 04/16/2017 04/03/2017  WBC 4.0 - 10.3 10e3/uL 8.4 8.8 2.9(L)  Hemoglobin 13.0 - 17.1 g/dL 9.5(L) 9.9(L) 10.1(L)  Hematocrit 38.4 - 49.9 % 31.4(L) 32.5(L) 33.0(L)  Platelets 140 - 400 10e3/uL 103(L) 95(L) 117(L)   CMP Latest Ref Rng & Units 05/02/2017 04/16/2017 04/03/2017  Glucose 70 - 140 mg/dl 132 154(H) 131  BUN 7.0 - 26.0 mg/dL 7.9 14.2 15.2  Creatinine 0.7 - 1.3 mg/dL 0.8 0.8 0.9  Sodium 136 - 145 mEq/L 139 141 137  Potassium 3.5 - 5.1 mEq/L 4.0 3.8 4.2  Chloride 96 - 106 mmol/L - - -  CO2 22 - 29 mEq/L '29 26 26  ' Calcium 8.4 - 10.4 mg/dL 8.7 9.0 9.2  Total Protein 6.4 - 8.3  g/dL 6.1(L) 6.4 6.5  Total Bilirubin 0.20 - 1.20 mg/dL 0.67 0.91 0.68  Alkaline Phos 40 - 150 U/L 92 91 68  AST 5 - 34 U/L 37(H) 43(H) 47(H)  ALT 0 - 55 U/L 41 45 42   PATHOLOGY REPORT Diagnosis 05/04/2016 1. Esophagogastric junction, biopsy, ulcerative mass - POORLY DIFFERENTIATED CARCINOMA, SEE COMMENT. 2. Stomach, biopsy - REACTIVE GASTROPATHY. - NEGATIVE FOR  HELICOBACTER PYLORI. - NO INTESTINAL METAPLASIA, DYSPLASIA, OR MALIGNANCY. Microscopic Comment 1. The majority of the specimen consists of gastroesophageal mucosa with reflux changes. There is a small focus of tumor underlying the squamous mucosa. There is lymphovascular invasion. There is no background intestinal metaplasia (Barrett's esophagus). Immunohistochemistry reveals only very focal weak cytokeratin 5/6, negative p63, and negative mucicarmine in the limited remaining tumor. Dr. Saralyn Pilar has reviewed the case. The case was called to Dr. Hilarie Fredrickson on 05/08/2016. 2. A Warthin-Starry stain is performed to determine the possibility of the presence of Helicobacter pylori. The Warthin-Starry stain is negative for organisms of Helicobacter pylori.     RADIOGRAPHIC STUDIES: I have personally reviewed the radiological images as listed and agreed with the findings in the report.  CT CAP w contrast 04/29/2017 IMPRESSION: 1. No evidence of metastatic disease. 2. Nodular airspace disease previously seen in the left lower lobe has resolved in the interval. 3. Tiny right pleural effusion, new. Small left pleural effusion, slightly increased. 4. Cirrhosis with splenomegaly. 5.  Aortic atherosclerosis (ICD10-170.0).   CT CAP w contrast 01/07/2017 IMPRESSION: 1. No residual esophageal mass or adenopathy. 2. New nodular airspace opacification in the left lower lobe. Continued attention on followup exams is warranted as metastatic disease cannot be definitively excluded. 3. Cirrhosis. 4. Trace left pleural effusion. 5. Aortic atherosclerosis (ICD10-170.0). Coronary artery calcification. 6. Cholelithiasis. 7. Mild basilar predominant subpleural reticular densities. Difficult to exclude interstitial lung disease.  EGD 05/04/2016 A large, ulcerating mass with no active bleeding and no stigmata of recent bleeding was found at the gastroesophageal junction extending into the gastric cardia,  beginning 40 cm from the incisors. The mass was non-obstructing and partially circumferential (involving one-half of the lumen circumference). The mass extends approximately 5 cm. IMPRESSION:  -Likely malignant esophageal tumor was found at the gastroesophageal junction extending into the gastric cardia. Biopsied. - Non-bleeding erosive gastropathy. Biopsied. - Erythematous duodenopathy. - Normal second portion of the duodenum.  ASSESSMENT & PLAN: 71 y.o. Caucasian male, with multiple comorbidities, including hypertension, diabetes, OSA, coronary artery disease, diastolic CHF, on continuous oxygen, morbid obesity, presented with progressive dysphagia and odynophagia.  1. Cancer of cardio-esophageal junction, poorly differentiated, cTxN2M1 with node metastasis, stage IV  -I previously reviewed his CT scan, EGD, and the biopsy results with patient and his family members in great details. -His biopsy pathology was previously reviewed in our tumor board, the morphology and weakly positive for CK 5/6, fevers squamous cell carcinoma -His CT scan findings are very concerning for distant metastasis to abdominal lymph nodes.  -I previously reviewed the PET scan images with patient and his daughter in person, he has hypermetabolic GE junction tumor, and diffuse adenopathy in mediastinum and upper abdomen. -We previously discussed the aggressive nature of esophageal cancer, an incurable nature of his disease due to the distant metastasis. The goal of therapy is palliative, to prolong his life and palliate his symptoms. -His tumor was negative for PD-L1, not a candidate for immunotherapy  -He is on first line palliative chemotherapy with weekly carbo and Taxol, every 2 weeks, tolerating low dose better  lately. -I previously reviewed his CT scan findings on 09/26/2016 show good partial response to chemoradiation, no new or progressive metastatic disease. - I previously  reviewed his restaging CT scan from  01/07/2017,  Which showed no residual esophageal mass or adenopathy, he has mild new infiltrative change in LLL, possible related to aspiration, he is asymptomatic, will continue monitoring  -He is clinically doing well, tolerating chemotherapy well, lab results reviewed with patient, mild pancytopenia, secondary to chemotherapy, adequate for treatment, we'll continue low-dose carboplatin and Taxol every 2 weeks. Due to the thrombocytopenia, I have decreased his carboplatin to AUC 1.5 previously. -I reviewed his restaging CT scan from 04/29/2017 which showed no evidence of primary or metastatic cancer. -We discussed her that scan has limitation to detect a small amount of tumor cells. I did not think he is cured, but his disease is very well controlled. He is tolerating treatment well, I recommend him to continue for now.  -We discussed the alternative option of holding chemotherapy and monitoring. After discussion, patient wishes to continue chemotherapy for now. -He has developed some signs of allergy reaction to carboplatin. He well start testing dose of carboplatin from now on, and I encouraged him to take Benadryl to help with facial redness after chemo. If worsens, he knows to call me. - Labs reviewed. He is adequate for treatment today. Will continue for every 2 weeks   2. CAD, diastolic CHF, on continuous oxygen -He will continue follow-up with his cardiologist Dr. Radford Pax -His Plavix has been held since the EGD and biopsy, he is on baby aspirin. -Need to watch his fluid intake during his chemotherapy, and also avoid fluid overload from chemo  -he is clinically stable  3. DM, HTN -We previously discussed his blood pressure and glucose need to be monitored closely during the chemotherapy, which may affect his blood glucose and blood pressure -He will continue medication, monitor his sugar and blood pressure at home, and follow-up with his primary care physician. -will decrease premed dexa before  taxol  -History simple has been held due to his borderline low blood pressure -He will see his primary care physician Dr. Anitra Lauth for diabetes management.  4. OSA, morbid obesity -We previously discussed healthy diet, I encouraged him to to be more physically active, and consider home PT -previously I strongly encouraged him to follow-up with dietitian during the therapy for nutrition support.  5. Arthralgia -He has baseline osteoarthritis, not physically active -However his arthralgia in his knees and ankle are much worse after chemotherapy but resolved now  -He will use oxycodone 5 mg as needed, constipation management previously reviewed with him. -Leg weakness improved with physical therapy. Previously encouraged the patient to continue with physical therapy.   6. Pancytopenia  -Second is to underlying malignancy and chemotherapy -Mild anemia is stable, his hemoglobin is 10.0 today, thrombocytopenia worse, platelet 88K today, we'll reduce chemotherapy dose -we'll consider blood transfusion if hemoglobin less than 8, for symptomatic anemia.  7. Goal of care discussion  -We previously discussed the incurable nature of his cancer, and the overall poor prognosis, especially if he does not have good response to chemotherapy or progress on chemo -The patient understands the goal of care is palliative. -Patient is very realistic, understands his cancer is not curable, and he agrees with DO NOT RESUSCITATE and DO NOT INTUBATE   PLAN - Labs reviewed. He is adequate for treatment today. Will continue [place] and Taxol every 2 weeks with onpro. He will start testing  dose of carbo from now on, and he knows to take Benadryl for facial redness after treatment. - f/u in 4 weeks -will schedule lab, flush, and chemo carbo and taxol in 4 and 6 weeks   All questions were answered. The patient knows to call the clinic with any problems, questions or concerns.  I spent 20 minutes counseling the  patient face to face. The total time spent in the appointment was 25 minutes and more than 35% was on counseling.  This document serves as a record of services personally performed by Truitt Merle, MD. It was created on her behalf by Brandt Loosen, a trained medical scribe. The creation of this record is based on the scribe's personal observations and the provider's statements to them. This document has been checked and approved by the attending provider.   Truitt Merle, MD 05/02/2017

## 2017-05-02 ENCOUNTER — Telehealth: Payer: Self-pay | Admitting: Hematology

## 2017-05-02 ENCOUNTER — Ambulatory Visit: Payer: Medicare Other

## 2017-05-02 ENCOUNTER — Ambulatory Visit (HOSPITAL_BASED_OUTPATIENT_CLINIC_OR_DEPARTMENT_OTHER): Payer: Medicare Other | Admitting: Hematology

## 2017-05-02 ENCOUNTER — Ambulatory Visit (HOSPITAL_BASED_OUTPATIENT_CLINIC_OR_DEPARTMENT_OTHER): Payer: Medicare Other

## 2017-05-02 ENCOUNTER — Other Ambulatory Visit (HOSPITAL_BASED_OUTPATIENT_CLINIC_OR_DEPARTMENT_OTHER): Payer: Medicare Other

## 2017-05-02 VITALS — BP 126/65 | HR 85 | Temp 98.6°F | Resp 18 | Ht 68.0 in | Wt 279.7 lb

## 2017-05-02 DIAGNOSIS — Z5111 Encounter for antineoplastic chemotherapy: Secondary | ICD-10-CM

## 2017-05-02 DIAGNOSIS — I10 Essential (primary) hypertension: Secondary | ICD-10-CM

## 2017-05-02 DIAGNOSIS — Z5189 Encounter for other specified aftercare: Secondary | ICD-10-CM | POA: Diagnosis not present

## 2017-05-02 DIAGNOSIS — C160 Malignant neoplasm of cardia: Secondary | ICD-10-CM | POA: Diagnosis not present

## 2017-05-02 DIAGNOSIS — D61810 Antineoplastic chemotherapy induced pancytopenia: Secondary | ICD-10-CM | POA: Diagnosis not present

## 2017-05-02 DIAGNOSIS — I5032 Chronic diastolic (congestive) heart failure: Secondary | ICD-10-CM | POA: Diagnosis not present

## 2017-05-02 DIAGNOSIS — Z66 Do not resuscitate: Secondary | ICD-10-CM

## 2017-05-02 DIAGNOSIS — E119 Type 2 diabetes mellitus without complications: Secondary | ICD-10-CM

## 2017-05-02 DIAGNOSIS — Z95828 Presence of other vascular implants and grafts: Secondary | ICD-10-CM

## 2017-05-02 LAB — COMPREHENSIVE METABOLIC PANEL
ALT: 41 U/L (ref 0–55)
AST: 37 U/L — AB (ref 5–34)
Albumin: 3.1 g/dL — ABNORMAL LOW (ref 3.5–5.0)
Alkaline Phosphatase: 92 U/L (ref 40–150)
Anion Gap: 8 mEq/L (ref 3–11)
BILIRUBIN TOTAL: 0.67 mg/dL (ref 0.20–1.20)
BUN: 7.9 mg/dL (ref 7.0–26.0)
CHLORIDE: 102 meq/L (ref 98–109)
CO2: 29 meq/L (ref 22–29)
CREATININE: 0.8 mg/dL (ref 0.7–1.3)
Calcium: 8.7 mg/dL (ref 8.4–10.4)
EGFR: >90 mL/min/{1.73_m2} (ref >90)
GLUCOSE: 132 mg/dL (ref 70–140)
Potassium: 4 mEq/L (ref 3.5–5.1)
SODIUM: 139 meq/L (ref 136–145)
TOTAL PROTEIN: 6.1 g/dL — AB (ref 6.4–8.3)

## 2017-05-02 LAB — CBC WITH DIFFERENTIAL/PLATELET
BASO%: 0.2 % (ref 0.0–2.0)
Basophils Absolute: 0 10*3/uL (ref 0.0–0.1)
EOS ABS: 0.1 10*3/uL (ref 0.0–0.5)
EOS%: 0.7 % (ref 0.0–7.0)
HEMATOCRIT: 31.4 % — AB (ref 38.4–49.9)
HGB: 9.5 g/dL — ABNORMAL LOW (ref 13.0–17.1)
LYMPH#: 1 10*3/uL (ref 0.9–3.3)
LYMPH%: 11.8 % — AB (ref 14.0–49.0)
MCH: 29.9 pg (ref 27.2–33.4)
MCHC: 30.3 g/dL — AB (ref 32.0–36.0)
MCV: 98.7 fL — AB (ref 79.3–98.0)
MONO#: 0.8 10*3/uL (ref 0.1–0.9)
MONO%: 9.3 % (ref 0.0–14.0)
NEUT#: 6.5 10*3/uL (ref 1.5–6.5)
NEUT%: 78 % — AB (ref 39.0–75.0)
PLATELETS: 103 10*3/uL — AB (ref 140–400)
RBC: 3.18 10*6/uL — AB (ref 4.20–5.82)
RDW: 18.2 % — ABNORMAL HIGH (ref 11.0–14.6)
WBC: 8.4 10*3/uL (ref 4.0–10.3)

## 2017-05-02 MED ORDER — CARBOPLATIN CHEMO INTRADERMAL TEST DOSE 100MCG/0.02ML
100.0000 ug | Freq: Once | INTRADERMAL | Status: AC
  Administered 2017-05-02: 100 ug via INTRADERMAL
  Filled 2017-05-02: qty 0.02

## 2017-05-02 MED ORDER — FAMOTIDINE IN NACL 20-0.9 MG/50ML-% IV SOLN
20.0000 mg | Freq: Once | INTRAVENOUS | Status: AC
  Administered 2017-05-02: 20 mg via INTRAVENOUS

## 2017-05-02 MED ORDER — PALONOSETRON HCL INJECTION 0.25 MG/5ML
0.2500 mg | Freq: Once | INTRAVENOUS | Status: AC
  Administered 2017-05-02: 0.25 mg via INTRAVENOUS

## 2017-05-02 MED ORDER — PEGFILGRASTIM 6 MG/0.6ML ~~LOC~~ PSKT
6.0000 mg | PREFILLED_SYRINGE | Freq: Once | SUBCUTANEOUS | Status: AC
  Administered 2017-05-02: 6 mg via SUBCUTANEOUS
  Filled 2017-05-02: qty 0.6

## 2017-05-02 MED ORDER — FAMOTIDINE IN NACL 20-0.9 MG/50ML-% IV SOLN
INTRAVENOUS | Status: AC
  Filled 2017-05-02: qty 50

## 2017-05-02 MED ORDER — SODIUM CHLORIDE 0.9 % IV SOLN
225.0000 mg | Freq: Once | INTRAVENOUS | Status: AC
  Administered 2017-05-02: 230 mg via INTRAVENOUS
  Filled 2017-05-02: qty 23

## 2017-05-02 MED ORDER — SODIUM CHLORIDE 0.9 % IV SOLN
Freq: Once | INTRAVENOUS | Status: AC
  Administered 2017-05-02: 15:00:00 via INTRAVENOUS

## 2017-05-02 MED ORDER — HEPARIN SOD (PORK) LOCK FLUSH 100 UNIT/ML IV SOLN
500.0000 [IU] | Freq: Once | INTRAVENOUS | Status: AC | PRN
  Administered 2017-05-02: 500 [IU]
  Filled 2017-05-02: qty 5

## 2017-05-02 MED ORDER — DEXTROSE 5 % IV SOLN
80.0000 mg/m2 | Freq: Once | INTRAVENOUS | Status: AC
  Administered 2017-05-02: 204 mg via INTRAVENOUS
  Filled 2017-05-02: qty 34

## 2017-05-02 MED ORDER — DEXAMETHASONE SODIUM PHOSPHATE 10 MG/ML IJ SOLN
10.0000 mg | Freq: Once | INTRAMUSCULAR | Status: AC
  Administered 2017-05-02: 10 mg via INTRAVENOUS

## 2017-05-02 MED ORDER — DEXAMETHASONE SODIUM PHOSPHATE 10 MG/ML IJ SOLN
INTRAMUSCULAR | Status: AC
  Filled 2017-05-02: qty 1

## 2017-05-02 MED ORDER — SODIUM CHLORIDE 0.9% FLUSH
10.0000 mL | INTRAVENOUS | Status: DC | PRN
  Filled 2017-05-02: qty 10

## 2017-05-02 MED ORDER — SODIUM CHLORIDE 0.9% FLUSH
10.0000 mL | INTRAVENOUS | Status: DC | PRN
  Administered 2017-05-02 (×2): 10 mL via INTRAVENOUS
  Filled 2017-05-02: qty 10

## 2017-05-02 MED ORDER — PALONOSETRON HCL INJECTION 0.25 MG/5ML
INTRAVENOUS | Status: AC
  Filled 2017-05-02: qty 5

## 2017-05-02 MED ORDER — DIPHENHYDRAMINE HCL 50 MG/ML IJ SOLN
25.0000 mg | Freq: Once | INTRAMUSCULAR | Status: AC
  Administered 2017-05-02: 25 mg via INTRAVENOUS

## 2017-05-02 MED ORDER — DIPHENHYDRAMINE HCL 50 MG/ML IJ SOLN
INTRAMUSCULAR | Status: AC
  Filled 2017-05-02: qty 1

## 2017-05-02 NOTE — Patient Instructions (Signed)

## 2017-05-02 NOTE — Telephone Encounter (Signed)
Scheduled appt per 7/5 los - Gave patient AVS and calender per los.  

## 2017-05-02 NOTE — Patient Instructions (Signed)
[facility] Discharge Instructions for Patients Receiving Chemotherapy  Today you received the following chemotherapy agents Taxol/Carboplatin  To help prevent nausea and vomiting after your treatment, we encourage you to take your nausea medication    If you develop nausea and vomiting that is not controlled by your nausea medication, call the clinic.   BELOW ARE SYMPTOMS THAT SHOULD BE REPORTED IMMEDIATELY:  *FEVER GREATER THAN 100.5 F  *CHILLS WITH OR WITHOUT FEVER  NAUSEA AND VOMITING THAT IS NOT CONTROLLED WITH YOUR NAUSEA MEDICATION  *UNUSUAL SHORTNESS OF BREATH  *UNUSUAL BRUISING OR BLEEDING  TENDERNESS IN MOUTH AND THROAT WITH OR WITHOUT PRESENCE OF ULCERS  *URINARY PROBLEMS  *BOWEL PROBLEMS  UNUSUAL RASH Items with * indicate a potential emergency and should be followed up as soon as possible.  Feel free to call the clinic you have any questions or concerns. The clinic phone number is [phone].  Please show the CHEMO ALERT CARD at check-in to the Emergency Department and triage nurse.   

## 2017-05-04 ENCOUNTER — Encounter: Payer: Self-pay | Admitting: Hematology

## 2017-05-04 ENCOUNTER — Ambulatory Visit (HOSPITAL_BASED_OUTPATIENT_CLINIC_OR_DEPARTMENT_OTHER): Payer: Medicare Other

## 2017-05-04 ENCOUNTER — Ambulatory Visit: Payer: Medicare Other

## 2017-05-04 DIAGNOSIS — C160 Malignant neoplasm of cardia: Secondary | ICD-10-CM | POA: Diagnosis not present

## 2017-05-04 DIAGNOSIS — Z5189 Encounter for other specified aftercare: Secondary | ICD-10-CM | POA: Diagnosis not present

## 2017-05-04 DIAGNOSIS — Z66 Do not resuscitate: Secondary | ICD-10-CM | POA: Insufficient documentation

## 2017-05-06 ENCOUNTER — Other Ambulatory Visit: Payer: Self-pay | Admitting: Internal Medicine

## 2017-05-06 ENCOUNTER — Telehealth: Payer: Self-pay | Admitting: *Deleted

## 2017-05-06 NOTE — Telephone Encounter (Signed)
Pt advised and voiced understanding.   

## 2017-05-06 NOTE — Telephone Encounter (Signed)
-----   Message from Tammi Sou, MD sent at 05/05/2017  8:20 AM EDT ----- Pls notify pt that after talking to Dr. Burr Medico about shingrix, it is recommended that he hold off on getting this vaccine at this time. More information about this vaccine for people on chemo will be coming out in the next 6-12 months, and we'll see if it is recommended after that.-thx

## 2017-05-07 DIAGNOSIS — J449 Chronic obstructive pulmonary disease, unspecified: Secondary | ICD-10-CM | POA: Diagnosis not present

## 2017-05-07 MED ORDER — PEGFILGRASTIM INJECTION 6 MG/0.6ML ~~LOC~~
6.0000 mg | PREFILLED_SYRINGE | Freq: Once | SUBCUTANEOUS | Status: AC
  Administered 2017-05-04: 6 mg via SUBCUTANEOUS

## 2017-05-07 NOTE — Progress Notes (Signed)
On Saturday, 05/04/17 patient arrived to infusion room stating that he was told to report to infusion room to receive Neulasta 6 mg injection. Patient previously had Neulasta Onpro attached on 05/02/17 after chemotherapy treatment. On 05/03/17 the Onpro device failed to deliver Neulasta.  Patient called on-call physician (Dr. Burr Medico), who told patient to report to [facility] infusion room on Saturday, 05/04/17 to receive Neulasta injection.  Neulasta 6mg  injection given to patient at that time.

## 2017-05-10 NOTE — Progress Notes (Signed)
[place]  [phone] [phone]  Clinic Follow up Note   Patient Care Team: Tammi Sou, MD as PCP - General (Family Medicine) Harold Hedge, Darrick Grinder, MD as Consulting Physician (Allergy and Immunology) Shirley Muscat Loreen Freud, MD as Consulting Physician (Optometry) Sueanne Margarita, MD as Consulting Physician (Cardiology) Pyrtle, Lajuan Lines, MD as Consulting Physician (Gastroenterology) Latanya Maudlin, MD as Consulting Physician (Orthopedic Surgery) Jodi Marble, MD as Consulting Physician (Otolaryngology) Truitt Merle, MD as Consulting Physician (Hematology) Kyung Rudd, MD as Consulting Physician (Radiation Oncology) 05/15/2017     CHIEF COMPLAINTS:  Follow up GE junction cancer  Oncology History   Cancer of cardio-esophageal junction [facility])   Staging form: Stomach, AJCC 7th Edition     Clinical stage from 05/04/2016: Stage IV ([place], N2, M1) - Signed by Truitt Merle, MD on 05/18/2016        Cancer of cardio-esophageal junction ([place])   05/04/2016 Initial Diagnosis    Cancer of cardio-esophageal junction ([place])      05/04/2016 Procedure    EGD showed a large ulcerating mass with no active bleeding at the gastroesophageal junction extending into the gastric cardia. The mass was not obstructing and partially circumferential, extends approximately 5 cm. Nonbleeding erosive gastropathy.      05/04/2016 Initial Biopsy    Esophageal gastric junction biopsy showed a poorly differentiated carcinoma underlying the squamous mucosa. There is lymphovascular invasion. No Intestinal metaplasia. IHC weakly positive CK5/6, p63 (-), favor squamous.        05/09/2016 Imaging    CT CAP w contrast showed mild wall thickening involving the distal esophagus and proximal stomach compatible with known cancer, enlarged mediastinal and upper abdominal lymph nodes are highly suspicious for metastatic adenopathy, propable liver cirrhosis.      06/04/2016 - 06/22/2016 Radiation  Therapy    palliative radiation to esophageal cancer       07/06/2016 -  Chemotherapy    chemotherapy with weekly carboplatin and taxol, started on 07/06/2016, held 08/21/16-09/28/2016 due to hospitalization, changed to every 2 weeks from 11/30/2016      07/30/2016 Pathology Results    PD-L1 negative expression      08/28/2016 - 08/31/2016 Hospital Admission    The patient was admitted to 4 severe hypoglycemia, dehydration, and acute renal failure. Infection workup was negative. He recovered well with supportive care. His insulin and hypertension medication was held on discharge, except insulin sliding scale.      09/24/2016 Imaging    CT CAP W CONTRAST 09/26/2016 IMPRESSION: Decreased masslike soft tissue prominence at gastroesophageal junction, consistent with decreased size of primary gastroesophageal junction carcinoma. Resolution of paraesophageal and gastrohepatic ligament lymphadenopathy since prior exam. Other sub-cm mediastinal lymph nodes show little or no significant change. Stable indeterminate sub-cm low-attenuation lesion in the liver dome. Probable hepatic cirrhosis. Recommend continued attention on follow-up CT. No new or progressive metastatic disease identified. Cholelithiasis.  No radiographic evidence of cholecystitis. Colonic diverticulosis. No radiographic evidence of diverticulitis. Aortic atherosclerosis and three-vessel coronary artery calcification.      01/07/2017 Imaging    CT CAP w contrast 1. No residual esophageal mass or adenopathy. 2. New nodular airspace opacification in the left lower lobe. Continued attention on followup exams is warranted as metastatic disease cannot be definitively excluded. 3. Cirrhosis. 4. Trace left pleural effusion. 5. Aortic atherosclerosis (ICD10-170.0). Coronary artery calcification. 6. Cholelithiasis. 7. Mild basilar predominant subpleural reticular densities. Difficult to exclude interstitial lung disease.       04/29/2017 Imaging  CT CAP w contrast  IMPRESSION: 1. No evidence of metastatic disease. 2. Nodular airspace disease previously seen in the left lower lobe has resolved in the interval. 3. Tiny right pleural effusion, new. Small left pleural effusion, slightly increased. 4. Cirrhosis with splenomegaly. 5.  Aortic atherosclerosis (ICD10-170.0).       HISTORY OF PRESENTING ILLNESS:  [name] 71 y.o. male is here because of His reason that diagnosed GE junction carcinoma. He is a comment of by his wife and daughter to our multidisciplinary chart clinic today.  He has been having progressive dysphagia and odynophagia for 2 month, he has been eating soft diet only in the past one week. He has pain in the mid chest and epigastric area only when he eats, with burning sensation, no nausea, abdominal bloating, or other discomfort. His appetite has remained well, no weight loss,   He has multiple medical problems, especially heart failure, he has been on oxygen continuously for 5-6 years. He is morbidly obese, has diabetes, hypertension, mild neuropathy, OSA etc. He has very sedentary life style, he sits in the chair and watch TV most of time during the day. He only comes out for shopping for a few times a week. He uses a [employer], he can walk for a few hundrends feet before he has to stop to catch his breasts. He denies cough or sputum production, no GI discomfort, he has been having mild dirrhea, loose stool, twice daily, no melena or hematochezia.  CURRENT THERAPY: chemotherapy with weekly carboplatin and taxol, started on 07/06/2016, held 08/21/16-09/28/2016 due to hospitalization, changed to every 2 weeks from 11/30/2016; Due to moderate some cytopenia, I'll reduce carboplatin AUC to 1.0, continue Taxol 70 mg/m, continue chemotherapy every 2 weeks (03/06/17). Add Onpro patch starting 04/08/17   INTERIM HISTORY:  Mr. [name] returns for follow-up. He presents to the clinic today with his  wife. He still got the rash again after treatment. The rash was there and resolved the next day but he did not feel it bothered him. He took claritin for 5 days to help reduce the reaction. They claim this reaction has occurred for a while but not worsened.  This rash started halfway through the treatment course. He feels this is manageable.    MEDICAL HISTORY:  Past Medical History:  Diagnosis Date  . Asthma    as a child  . Cataract    multiple types, bilateral  . Cholelithiasis without cholecystitis   . Chronic diastolic CHF (congestive heart failure) ([place]) 02/25/2009  . Chronic renal insufficiency, stage III (moderate) 2017   Stage II/III (GFR around 60)  . 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 COPD (chronic obstructive pulmonary disease) ([place])   . Coronary artery disease    chronically occluded LAD and diagonal with right to left collaterals, mild disease in the left circ and moderate disease in the mid RCA on medical management with Imdur, ASA, and Plavix.  . Dermatitis 05/2014  . DIABETES MELLITUS, TYPE II 06/24/2007   No diab retpthy as of 08/05/15 eye exam.  . Esophageal cancer ([place]) 04/2016   poorly differentiated carcinoma (Dr. Pyrtle--EGD).  CT C/A/P showed metastatic adenopathy in mediastinum and upper abdomen 05/09/16.  Tx plan is palliative radiation (completed 06/22/16), then palliative systemic chemotherapy (carbo+taxol) was started but as of 08/03/16 onc f/u this was held due to severe knee and ankle arthralgias.  Restarting as  of 10/2015 (08/2016 CT showed some disease regression  . Esophageal cancer ([place])    CT 12/2016 showed no residual esoph mass--plan to continue chemo.  Ongoing palliative chemo with carboplatin and taxol q 2 wks as of 03/2017 onc f/u (CT 12/2016 and 04/2017 showed no progression of dz).  . Essential hypertension 05/01/2007   Qualifier: Diagnosis of  By: Tiney Rouge CMA, Ellison Hughs     . History of kidney  stones   . Hyperkalemia 12/2015   Decreased ACE-I by 50% in response, then potassium normalized.  Marland Kitchen HYPERLIPIDEMIA 06/24/2007  . Hypoxemia 01/27/2010  . Myocardial infarction [facility])    pt states he was informed per MD that he has had one but pt was unaware   . OBESITY 09/02/2008  . Obesity hypoventilation syndrome (HCC)    oxygen 24/7 (2 liters [place] as of 02/2016)  . On home oxygen therapy    Oxygen @ 2l/m nasally 24/7 hours  . OSA (obstructive sleep apnea)    not tested; pt scored 4 per stop bang tool results sent to PCP   . OSTEOARTHRITIS 05/01/2007  . Pancytopenia due to chemotherapy ([place]) 2018  . Presbycusis of both ears 05/2015   [facility]  . Pruritic condition 05/2014   Allergist summer 2015, no new testing.  [name] field defect 05/10/16   Per Dr. Melina Fiddler, O.D.: Rt, loss of inf/temp quad and some loss sup/temp.  ?CVA  ? Pituitary tumor? ?Brain met.    SURGICAL HISTORY: Past Surgical History:  Procedure Laterality Date  . APPENDECTOMY    . BALLOON DILATION N/A 05/04/2016   Procedure: BALLOON DILATION;  Surgeon: Jerene Bears, MD;  Location: [facility];  Service: Gastroenterology;  Laterality: N/A;  . CARDIAC CATHETERIZATION    . CARPAL TUNNEL RELEASE Left   . CATARACT EXTRACTION W/PHACO Right 04/29/2013   Procedure: CATARACT EXTRACTION PHACO AND INTRAOCULAR LENS PLACEMENT (IOC);  Surgeon: Adonis Brook, MD;  Location: [place];  Service: Ophthalmology;  Laterality: Right;  . CATARACT EXTRACTION, BILATERAL    . COLONOSCOPY W/ POLYPECTOMY  08/2011   Many polyps--all hyperplastic, severe diverticulosis, int hem.  [employer] hemoccult testing via [facility] 06/22/15 NEG  . ESOPHAGOGASTRODUODENOSCOPY N/A 05/04/2016   Procedure: ESOPHAGOGASTRODUODENOSCOPY (EGD);  Surgeon: Jerene Bears, MD;  Location: [facility];  Service: Gastroenterology;  Laterality: N/A;  . IR GENERIC HISTORICAL  10/03/2016   IR US GUIDE VASC ACCESS RIGHT 10/03/2016 Greggory Keen, MD [facility]  . IR GENERIC HISTORICAL  10/03/2016    IR FLUORO GUIDE PORT INSERTION RIGHT 10/03/2016 Greggory Keen, MD [facility]  . KNEE ARTHROSCOPY WITH MEDIAL MENISECTOMY Right 12/16/2014   Procedure: RIGHT KNEE ARTHROSCOPY WITH MEDIAL MENISECTOMY microfracture medial femoral condyle abrasion condroplasty medial femoral condyle lateral menisectomy;  Surgeon: Tobi Bastos, MD;  Location: [facility];  Service: Orthopedics;  Laterality: Right;  . LEFT HEART CATHETERIZATION WITH CORONARY ANGIOGRAM N/A 10/26/2013   Procedure: LEFT HEART CATHETERIZATION WITH CORONARY ANGIOGRAM;  Surgeon: Jettie Booze, MD;  Location: [facility] C[facility] LAB;  Service: Cardiovascular;  Laterality: N/A;  . LUMBAR [place] SURGERY  2001  . SHOULDER SURGERY Right    right fx  . TONSILLECTOMY    . TRANSTHORACIC ECHOCARDIOGRAM  03/2016   EF 55-60%, grade I DD, LAE  . WRIST SURGERY Right    right fx    SOCIAL HISTORY: Social History   Social History  . Marital status: Married    Spouse name: susan  . Number of children: 2  . Years of education:  N/A   Occupational History  . Retired    Social History Main Topics  . Smoking status: Former Smoker    Packs/day: 1.50    Years: 30.00    Types: Cigarettes, Pipe, Cigars    Quit date: 10/30/1983  . Smokeless tobacco: Former Systems developer    Types: Chew    Quit date: 05/15/2016  . Alcohol use No  . Drug use: No  . Sexual activity: Not Currently   Other Topics Concern  . Not on file   Social History Narrative   Married, one son and one daughter.   His daughter and her two children live with him.   Coffee daily.  Former smoker.  No alcohol.   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   Attends church weekly-   Oxygen continuous       FAMILY HISTORY: Family History  Problem Relation Age of Onset  . Heart attack Mother   . Aneurysm Father   . Alcohol abuse Father   . Diabetes Maternal Aunt   . Colon cancer Neg Hx     ALLERGIES:  has No Known Allergies.  MEDICATIONS:  Current Outpatient  Prescriptions  Medication Sig Dispense Refill  . aspirin 81 MG tablet Take 81 mg by mouth daily.      Marland Kitchen atorvastatin (LIPITOR) 80 MG tablet TAKE ONE TABLET BY MOUTH ONCE DAILY 90 tablet 2  . beta carotene w/minerals (OCUVITE) tablet Take 1 tablet by mouth daily.    . cetirizine (ZYRTEC) 10 MG tablet Take 10 mg by mouth daily.    . citalopram (CELEXA) 20 MG tablet TAKE ONE TABLET BY MOUTH ONCE DAILY 90 tablet 1  . clonazePAM (KLONOPIN) 1 MG tablet TAKE ONE TABLET BY MOUTH TWICE DAILY AS NEEDED FOR ANXIETY 60 tablet 5  . cromolyn (OPTICROM) 4 % ophthalmic solution Place 1 drop into both eyes 4 (four) times daily.    Marland Kitchen ezetimibe (ZETIA) 10 MG tablet Take 1 tablet (10 mg total) by mouth daily. 90 tablet 3  . gabapentin (NEURONTIN) 300 MG capsule TAKE ONE CAPSULE BY MOUTH THREE TIMES DAILY 270 capsule 3  . hydrOXYzine (ATARAX/VISTARIL) 25 MG tablet TAKE 1 TABLET BY MOUTH AT SUPPER AND 1 TABLET BY MOUTH AT BEDTIME FOR INSOMNIA 60 tablet 6  . Insulin Lispro (HUMALOG KWIKPEN) 200 UNIT/ML SOPN Inject 20 Units into the skin 2 (two) times daily with a meal. (Patient taking differently: Inject 16 Units into the skin 2 (two) times daily with a meal. Takes  16 units  Twice  Daily.) 18 pen 6  . Insulin NPH, Human,, Isophane, (HUMULIN N) 100 UNIT/ML Kiwkpen 16 units every morning and 16 units every evening 15 mL 3  . magnesium chloride (SLOW-MAG) 64 MG TBEC SR tablet Take 1 tablet (64 mg total) by mouth daily. 60 tablet 0  . metFORMIN (GLUCOPHAGE) 1000 MG tablet TAKE ONE TABLET BY MOUTH TWICE DAILY WITH MEALS 180 tablet 1  . Omega-3 Fatty Acids ([employer] FISH OIL) 1000 MG CAPS Take 2 tablets by mouth 2 (two) times daily. 90 capsule   . ondansetron (ZOFRAN) 8 MG tablet Take 1 tablet (8 mg total) by mouth 2 (two) times daily as needed for refractory nausea / vomiting. Start on day 3 after chemo. 30 tablet 1  . ONE TOUCH ULTRA TEST test strip USE  STRIP TO CHECK GLUCOSE THREE TIMES DAILY AS  DIRECTED 100 each 11  .  ONETOUCH DELICA LANCETS 70H MISC USE ONE  TO CHECK GLUCOSE THREE TIMES DAILY 100 each 11  . oxyCODONE (OXY IR/ROXICODONE) 5 MG immediate release tablet Take 1 tablet (5 mg total) by mouth every 8 (eight) hours as needed for severe pain. 30 tablet 0  . OXYGEN Inhale 2 L/min into the lungs continuous.    . pantoprazole (PROTONIX) 40 MG tablet TAKE 1 TABLET BY MOUTH TWICE DAILY 60 tablet 2  . prochlorperazine (COMPAZINE) 10 MG tablet Take 1 tablet (10 mg total) by mouth every 6 (six) hours as needed (Nausea or vomiting). 30 tablet 1   No current facility-administered medications for this visit.    Facility-Administered Medications Ordered in Other Visits  Medication Dose Route Frequency Provider Last Rate Last Dose  . sodium chloride flush (NS) 0.9 % injection 10 mL  10 mL Intravenous PRN Truitt Merle, MD   10 mL at 04/16/17 1459  . sodium chloride flush (NS) 0.9 % injection 10 mL  10 mL Intravenous PRN Truitt Merle, MD   10 mL at 05/02/17 1725    REVIEW OF SYSTEMS:  Constitutional: Denies fevers, abnormal night sweats  Eyes: Denies blurriness of vision, double vision or watery eyes Ears, nose, mouth, throat, and face: Denies mucositis or sore throat, Respiratory: Denies cough, dyspnea or wheezes Cardiovascular: Denies palpitation, chest discomfort or lower extremity swelling Gastrointestinal:  Denies nausea, heartburn or change in bowel habits  Skin: Denies abnormal skin rashes (+)skin flushing (+) bruising on skin of left forearm  Lymphatics: Denies new lymphadenopathy or easy bruising Neurological:Denies numbness, tingling   Behavioral/Psych: Mood is stable, no new changes  All other systems were reviewed with the patient and are negative.  PHYSICAL EXAMINATION:  ECOG PERFORMANCE STATUS: 3 - Symptomatic, >50% confined to bed  Vitals:   05/15/17 0910  BP: 114/78  Pulse: 92  Resp: 20  Temp: 97.9 F (36.6 C)   Filed Weights   05/15/17 0910  Weight: 279 lb 14.4 oz (127 kg)      GENERAL:alert, no distress and comfortable, morbid obese, sitting in wheelchair with nasal cannula oxygen  SKIN: skin color, texture, turgor are normal, no rashes or significant lesions EYES: normal, conjunctiva are pink and non-injected, sclera clear OROPHARYNX:no exudate, no erythema and lips, buccal mucosa, and tongue normal  NECK: supple, thyroid normal size, non-tender, without nodularity LYMPH:  no palpable lymphadenopathy in the cervical, axillary or inguinal LUNGS: clear, (+) rales on bilateral lung base HEART: regular rate & rhythm and no murmurs and no lower extremity edema ABDOMEN:abdomen soft, non-tender and normal bowel sounds Musculoskeletal:no cyanosis of digits and no clubbing  PSYCH: alert & oriented x 3 with fluent speech NEURO: no focal motor/sensory deficits EXT: Trace edema, he wears compression stocks, no significant ankle swollen, erythema, or warmness.   LABORATORY DATA:  I have reviewed the data as listed CBC Latest Ref Rng & Units 05/15/2017 05/02/2017 04/16/2017  WBC 4.0 - 10.3 10e3/uL 9.3 8.4 8.8  Hemoglobin 13.0 - 17.1 g/dL 9.6(L) 9.5(L) 9.9(L)  Hematocrit 38.4 - 49.9 % 31.8(L) 31.4(L) 32.5(L)  Platelets 140 - 400 10e3/uL 70(L) 103(L) 95(L)   CMP Latest Ref Rng & Units 05/15/2017 05/02/2017 04/16/2017  Glucose 70 - 140 mg/dl 177(H) 132 154(H)  BUN 7.0 - 26.0 mg/dL 9.1 7.9 14.2  Creatinine 0.7 - 1.3 mg/dL 0.8 0.8 0.8  Sodium 136 - 145 mEq/L 140 139 141  Potassium 3.5 - 5.1 mEq/L 3.8 4.0 3.8  Chloride 96 - 106 mmol/L - - -  CO2 22 - 29 mEq/L 26 29 26  Calcium 8.4 - 10.4 mg/dL 8.7 8.7 9.0  Total Protein 6.4 - 8.3 g/dL 6.0(L) 6.1(L) 6.4  Total Bilirubin 0.20 - 1.20 mg/dL 0.62 0.67 0.91  Alkaline Phos 40 - 150 U/L 115 92 91  AST 5 - 34 U/L 63(H) 37(H) 43(H)  ALT 0 - 55 U/L 61(H) 41 45   PATHOLOGY REPORT Diagnosis 05/04/2016 1. Esophagogastric junction, biopsy, ulcerative mass - POORLY DIFFERENTIATED CARCINOMA, SEE COMMENT. 2. Stomach, biopsy - REACTIVE  GASTROPATHY. - NEGATIVE FOR HELICOBACTER PYLORI. - NO INTESTINAL METAPLASIA, DYSPLASIA, OR MALIGNANCY. Microscopic Comment 1. The majority of the specimen consists of gastroesophageal mucosa with reflux changes. There is a small focus of tumor underlying the squamous mucosa. There is lymphovascular invasion. There is no background intestinal metaplasia (Barrett's esophagus). Immunohistochemistry reveals only very focal weak cytokeratin 5/6, negative p63, and negative mucicarmine in the limited remaining tumor. Dr. Saralyn Pilar has reviewed the case. The case was called to Dr. Hilarie Fredrickson on 05/08/2016. 2. A Warthin-Starry stain is performed to determine the possibility of the presence of Helicobacter pylori. The Warthin-Starry stain is negative for organisms of Helicobacter pylori.     RADIOGRAPHIC STUDIES: I have personally reviewed the radiological images as listed and agreed with the findings in the report.  CT CAP w contrast 04/29/2017 IMPRESSION: 1. No evidence of metastatic disease. 2. Nodular airspace disease previously seen in the left lower lobe has resolved in the interval. 3. Tiny right pleural effusion, new. Small left pleural effusion, slightly increased. 4. Cirrhosis with splenomegaly. 5.  Aortic atherosclerosis (ICD10-170.0).   CT CAP w contrast 01/07/2017 IMPRESSION: 1. No residual esophageal mass or adenopathy. 2. New nodular airspace opacification in the left lower lobe. Continued attention on followup exams is warranted as metastatic disease cannot be definitively excluded. 3. Cirrhosis. 4. Trace left pleural effusion. 5. Aortic atherosclerosis (ICD10-170.0). Coronary artery calcification. 6. Cholelithiasis. 7. Mild basilar predominant subpleural reticular densities. Difficult to exclude interstitial lung disease.  EGD 05/04/2016 A large, ulcerating mass with no active bleeding and no stigmata of recent bleeding was found at the gastroesophageal junction extending  into the gastric cardia, beginning 40 cm from the incisors. The mass was non-obstructing and partially circumferential (involving one-half of the lumen circumference). The mass extends approximately 5 cm. IMPRESSION:  -Likely malignant esophageal tumor was found at the gastroesophageal junction extending into the gastric cardia. Biopsied. - Non-bleeding erosive gastropathy. Biopsied. - Erythematous duodenopathy. - Normal second portion of the duodenum.  ASSESSMENT & PLAN: 71 y.o. Caucasian male, with multiple comorbidities, including hypertension, diabetes, OSA, coronary artery disease, diastolic CHF, on continuous oxygen, morbid obesity, presented with progressive dysphagia and odynophagia.  1. Cancer of cardio-esophageal junction, poorly differentiated, cTxN2M1 with node metastasis, stage IV  -I previously reviewed his CT scan, EGD, and the biopsy results with patient and his family members in great details. -His biopsy pathology was previously reviewed in our tumor board, the morphology and weakly positive for CK 5/6, fevers squamous cell carcinoma -His CT scan findings are very concerning for distant metastasis to abdominal lymph nodes.  -I previously reviewed the PET scan images with patient and his daughter in person, he has hypermetabolic GE junction tumor, and diffuse adenopathy in mediastinum and upper abdomen. -We previously discussed the aggressive nature of esophageal cancer, an incurable nature of his disease due to the distant metastasis. The goal of therapy is palliative, to prolong his life and palliate his symptoms. -His tumor was negative for PD-L1, not a candidate for immunotherapy  -He is on first line  palliative chemotherapy with weekly carbo and Taxol, every 2 weeks, tolerating low dose better lately. -I previously reviewed his CT scan findings on 09/26/2016 show good partial response to chemoradiation, no new or progressive metastatic disease. - I previously  reviewed his  restaging CT scan from 01/07/2017,  Which showed no residual esophageal mass or adenopathy, he has mild new infiltrative change in LLL, possible related to aspiration, he is asymptomatic, will continue monitoring  -He is clinically doing well, tolerating chemotherapy well, lab results reviewed with patient, mild pancytopenia, secondary to chemotherapy, adequate for treatment, we'll continue low-dose carboplatin and Taxol every 2 weeks. Due to the thrombocytopenia, I have decreased his carboplatin to AUC 1.5 previously. -I reviewed his restaging CT scan from 04/29/2017 which showed no evidence of primary or metastatic cancer. -We discussed her that scan has limitation to detect a small amount of tumor cells. I did not think he is cured, but his disease is very well controlled. He is tolerating treatment well, I recommend him to continue for now.  -We discussed the alternative option of holding chemotherapy and monitoring. After discussion, patient wishes to continue chemotherapy for now. -He has developed some signs of allergy reaction to carboplatin. He well start testing dose of carboplatin from now on, and I encouraged him to take Benadryl to help with facial redness after chemo. If worsens, he knows to call me. -Due to skin flushing from chemotherapy, we will do a skin test every time we give him [place].  -Labs reviewed ans his plt is 70K today,   we will hold [place] and only continue with Taxol today. --I will also postpone his next 2 treatments another week so huis counts have time to recover.  -He will not need Onpro patch today.  -I encourage him to notify us if he starts bleeding or gets a nose bleeding or increases in bruising.    2. CAD, diastolic CHF, on continuous oxygen -He will continue follow-up with his cardiologist Dr. Radford Pax -His Plavix has been held since the EGD and biopsy, he is on baby aspirin. -Need to watch his fluid intake during his chemotherapy, and also avoid fluid overload  from chemo  -he is clinically stable  3. DM, HTN -We previously discussed his blood pressure and glucose need to be monitored closely during the chemotherapy, which may affect his blood glucose and blood pressure -He will continue medication, monitor his sugar and blood pressure at home, and follow-up with his primary care physician. -will decrease premed dexa before taxol  -History simple has been held due to his borderline low blood pressure -He will see his primary care physician Dr. Anitra Lauth for diabetes management.  4. OSA, morbid obesity -We previously discussed healthy diet, I encouraged him to to be more physically active, and consider home PT -previously I strongly encouraged him to follow-up with dietitian during the therapy for nutrition support.  5. Arthralgia -He has baseline osteoarthritis, not physically active -However his arthralgia in his knees and ankle are much worse after chemotherapy but resolved now  -He will use oxycodone 5 mg as needed, constipation management previously reviewed with him. -Leg weakness improved with physical therapy. Previously encouraged the patient to continue with physical therapy.   6. Pancytopenia  -Second is to underlying malignancy and chemotherapy -Mild anemia is stable, his hemoglobin is 10.0 previously, thrombocytopenia worse, platelet 88K previously, we reduced chemotherapy dose -we'll consider blood transfusion if hemoglobin less than 8, for symptomatic anemia.  7. Goal of care discussion, DNR/DNI -  We previously discussed the incurable nature of his cancer, and the overall poor prognosis, especially if he does not have good response to chemotherapy or progress on chemo -The patient understands the goal of care is palliative. -Patient is very realistic, understands his cancer is not curable, and he agrees with DO NOT RESUSCITATE and DO NOT INTUBATE   PLAN -Labs reviewed and plt counts are low at 70. -Hold Carbo and Onpro today,  continue with Taxol infusion today -Postpone next two treatmenst for one week -F/u in 3 weeks before chemo     All questions were answered. The patient knows to call the clinic with any problems, questions or concerns.  I spent 20 minutes counseling the patient face to face. The total time spent in the appointment was 25 minutes and more than 35% was on counseling.  This document serves as a record of services personally performed by Truitt Merle, MD. It was created on her behalf by Joslyn Devon, a trained medical scribe. The creation of this record is based on the scribe's personal observations and the provider's statements to them. This document has been checked and approved by the attending provider.    Truitt Merle, MD 05/15/2017

## 2017-05-15 ENCOUNTER — Ambulatory Visit: Payer: Medicare Other

## 2017-05-15 ENCOUNTER — Encounter: Payer: Self-pay | Admitting: Family Medicine

## 2017-05-15 ENCOUNTER — Ambulatory Visit (HOSPITAL_BASED_OUTPATIENT_CLINIC_OR_DEPARTMENT_OTHER): Payer: Medicare Other

## 2017-05-15 ENCOUNTER — Ambulatory Visit (HOSPITAL_BASED_OUTPATIENT_CLINIC_OR_DEPARTMENT_OTHER): Payer: Medicare Other | Admitting: Hematology

## 2017-05-15 ENCOUNTER — Telehealth: Payer: Self-pay | Admitting: Hematology

## 2017-05-15 ENCOUNTER — Other Ambulatory Visit (HOSPITAL_BASED_OUTPATIENT_CLINIC_OR_DEPARTMENT_OTHER): Payer: Medicare Other

## 2017-05-15 VITALS — BP 114/78 | HR 92 | Temp 97.9°F | Resp 20 | Ht 68.0 in | Wt 279.9 lb

## 2017-05-15 DIAGNOSIS — E119 Type 2 diabetes mellitus without complications: Secondary | ICD-10-CM | POA: Diagnosis not present

## 2017-05-15 DIAGNOSIS — Z5111 Encounter for antineoplastic chemotherapy: Secondary | ICD-10-CM

## 2017-05-15 DIAGNOSIS — I2510 Atherosclerotic heart disease of native coronary artery without angina pectoris: Secondary | ICD-10-CM | POA: Diagnosis not present

## 2017-05-15 DIAGNOSIS — I5032 Chronic diastolic (congestive) heart failure: Secondary | ICD-10-CM

## 2017-05-15 DIAGNOSIS — D61810 Antineoplastic chemotherapy induced pancytopenia: Secondary | ICD-10-CM | POA: Diagnosis not present

## 2017-05-15 DIAGNOSIS — Z95828 Presence of other vascular implants and grafts: Secondary | ICD-10-CM

## 2017-05-15 DIAGNOSIS — C160 Malignant neoplasm of cardia: Secondary | ICD-10-CM

## 2017-05-15 DIAGNOSIS — I10 Essential (primary) hypertension: Secondary | ICD-10-CM | POA: Diagnosis not present

## 2017-05-15 LAB — CBC WITH DIFFERENTIAL/PLATELET
BASO%: 0.3 % (ref 0.0–2.0)
Basophils Absolute: 0 10*3/uL (ref 0.0–0.1)
EOS ABS: 0.1 10*3/uL (ref 0.0–0.5)
EOS%: 1.1 % (ref 0.0–7.0)
HCT: 31.8 % — ABNORMAL LOW (ref 38.4–49.9)
HEMOGLOBIN: 9.6 g/dL — AB (ref 13.0–17.1)
LYMPH%: 8.1 % — AB (ref 14.0–49.0)
MCH: 29.8 pg (ref 27.2–33.4)
MCHC: 30.2 g/dL — ABNORMAL LOW (ref 32.0–36.0)
MCV: 98.8 fL — AB (ref 79.3–98.0)
MONO#: 0.7 10*3/uL (ref 0.1–0.9)
MONO%: 7.4 % (ref 0.0–14.0)
NEUT%: 83.1 % — ABNORMAL HIGH (ref 39.0–75.0)
NEUTROS ABS: 7.7 10*3/uL — AB (ref 1.5–6.5)
Platelets: 70 10*3/uL — ABNORMAL LOW (ref 140–400)
RBC: 3.22 10*6/uL — AB (ref 4.20–5.82)
RDW: 18.3 % — AB (ref 11.0–14.6)
WBC: 9.3 10*3/uL (ref 4.0–10.3)
lymph#: 0.8 10*3/uL — ABNORMAL LOW (ref 0.9–3.3)

## 2017-05-15 LAB — COMPREHENSIVE METABOLIC PANEL
ALBUMIN: 3.1 g/dL — AB (ref 3.5–5.0)
ALK PHOS: 115 U/L (ref 40–150)
ALT: 61 U/L — AB (ref 0–55)
AST: 63 U/L — ABNORMAL HIGH (ref 5–34)
Anion Gap: 11 mEq/L (ref 3–11)
BILIRUBIN TOTAL: 0.62 mg/dL (ref 0.20–1.20)
BUN: 9.1 mg/dL (ref 7.0–26.0)
CO2: 26 meq/L (ref 22–29)
CREATININE: 0.8 mg/dL (ref 0.7–1.3)
Calcium: 8.7 mg/dL (ref 8.4–10.4)
Chloride: 103 mEq/L (ref 98–109)
EGFR: 88 mL/min/{1.73_m2} — ABNORMAL LOW (ref >90)
GLUCOSE: 177 mg/dL — AB (ref 70–140)
Potassium: 3.8 mEq/L (ref 3.5–5.1)
SODIUM: 140 meq/L (ref 136–145)
TOTAL PROTEIN: 6 g/dL — AB (ref 6.4–8.3)

## 2017-05-15 MED ORDER — SODIUM CHLORIDE 0.9 % IV SOLN
Freq: Once | INTRAVENOUS | Status: AC
  Administered 2017-05-15: 10:00:00 via INTRAVENOUS

## 2017-05-15 MED ORDER — DEXAMETHASONE SODIUM PHOSPHATE 10 MG/ML IJ SOLN
10.0000 mg | Freq: Once | INTRAMUSCULAR | Status: AC
  Administered 2017-05-15: 10 mg via INTRAVENOUS

## 2017-05-15 MED ORDER — SODIUM CHLORIDE 0.9% FLUSH
10.0000 mL | INTRAVENOUS | Status: DC | PRN
  Administered 2017-05-15: 10 mL
  Filled 2017-05-15: qty 10

## 2017-05-15 MED ORDER — DIPHENHYDRAMINE HCL 50 MG/ML IJ SOLN
INTRAMUSCULAR | Status: AC
  Filled 2017-05-15: qty 1

## 2017-05-15 MED ORDER — DIPHENHYDRAMINE HCL 50 MG/ML IJ SOLN
25.0000 mg | Freq: Once | INTRAMUSCULAR | Status: AC
  Administered 2017-05-15: 25 mg via INTRAVENOUS

## 2017-05-15 MED ORDER — SODIUM CHLORIDE 0.9% FLUSH
10.0000 mL | INTRAVENOUS | Status: DC | PRN
  Administered 2017-05-15: 10 mL via INTRAVENOUS
  Filled 2017-05-15: qty 10

## 2017-05-15 MED ORDER — FAMOTIDINE IN NACL 20-0.9 MG/50ML-% IV SOLN
20.0000 mg | Freq: Once | INTRAVENOUS | Status: AC
  Administered 2017-05-15: 20 mg via INTRAVENOUS

## 2017-05-15 MED ORDER — HEPARIN SOD (PORK) LOCK FLUSH 100 UNIT/ML IV SOLN
500.0000 [IU] | Freq: Once | INTRAVENOUS | Status: AC | PRN
  Administered 2017-05-15: 500 [IU]
  Filled 2017-05-15: qty 5

## 2017-05-15 MED ORDER — PACLITAXEL CHEMO INJECTION 300 MG/50ML
80.0000 mg/m2 | Freq: Once | INTRAVENOUS | Status: AC
  Administered 2017-05-15: 204 mg via INTRAVENOUS
  Filled 2017-05-15: qty 34

## 2017-05-15 MED ORDER — FAMOTIDINE IN NACL 20-0.9 MG/50ML-% IV SOLN
INTRAVENOUS | Status: AC
  Filled 2017-05-15: qty 50

## 2017-05-15 MED ORDER — DEXAMETHASONE SODIUM PHOSPHATE 10 MG/ML IJ SOLN
INTRAMUSCULAR | Status: AC
  Filled 2017-05-15: qty 1

## 2017-05-15 NOTE — Patient Instructions (Signed)
[facility] Discharge Instructions for Patients Receiving Chemotherapy  Today you received the following chemotherapy agents Taxol  To help prevent nausea and vomiting after your treatment, we encourage you to take your nausea medication   If you develop nausea and vomiting that is not controlled by your nausea medication, call the clinic.   BELOW ARE SYMPTOMS THAT SHOULD BE REPORTED IMMEDIATELY:  *FEVER GREATER THAN 100.5 F  *CHILLS WITH OR WITHOUT FEVER  NAUSEA AND VOMITING THAT IS NOT CONTROLLED WITH YOUR NAUSEA MEDICATION  *UNUSUAL SHORTNESS OF BREATH  *UNUSUAL BRUISING OR BLEEDING  TENDERNESS IN MOUTH AND THROAT WITH OR WITHOUT PRESENCE OF ULCERS  *URINARY PROBLEMS  *BOWEL PROBLEMS  UNUSUAL RASH Items with * indicate a potential emergency and should be followed up as soon as possible.  Feel free to call the clinic you have any questions or concerns. The clinic phone number is [phone].  Please show the CHEMO ALERT CARD at check-in to the Emergency Department and triage nurse.   

## 2017-05-15 NOTE — Progress Notes (Signed)
Pt saw Dr. Burr Medico today prior to chemo.  Per Dr. Burr Medico, pt to receive only Taxol due to low platelet count.

## 2017-05-15 NOTE — Telephone Encounter (Signed)
Scheduled appt per 7/18 los - patient - called back to the treatment area before schedule was finished - patient to get new schedule in the treatment area.

## 2017-05-30 ENCOUNTER — Ambulatory Visit: Payer: Medicare Other

## 2017-05-30 ENCOUNTER — Other Ambulatory Visit: Payer: Self-pay | Admitting: Family Medicine

## 2017-05-30 ENCOUNTER — Other Ambulatory Visit: Payer: Medicare Other

## 2017-05-30 ENCOUNTER — Ambulatory Visit: Payer: Medicare Other | Admitting: Hematology

## 2017-06-06 ENCOUNTER — Ambulatory Visit (HOSPITAL_BASED_OUTPATIENT_CLINIC_OR_DEPARTMENT_OTHER): Payer: Medicare Other | Admitting: Hematology

## 2017-06-06 ENCOUNTER — Ambulatory Visit: Payer: Medicare Other

## 2017-06-06 ENCOUNTER — Encounter: Payer: Self-pay | Admitting: Hematology

## 2017-06-06 ENCOUNTER — Ambulatory Visit (HOSPITAL_BASED_OUTPATIENT_CLINIC_OR_DEPARTMENT_OTHER): Payer: Medicare Other

## 2017-06-06 ENCOUNTER — Other Ambulatory Visit (HOSPITAL_BASED_OUTPATIENT_CLINIC_OR_DEPARTMENT_OTHER): Payer: Medicare Other

## 2017-06-06 VITALS — BP 123/58 | HR 91 | Temp 97.8°F | Resp 100 | Ht 68.0 in | Wt 279.3 lb

## 2017-06-06 DIAGNOSIS — I10 Essential (primary) hypertension: Secondary | ICD-10-CM | POA: Diagnosis not present

## 2017-06-06 DIAGNOSIS — D61810 Antineoplastic chemotherapy induced pancytopenia: Secondary | ICD-10-CM

## 2017-06-06 DIAGNOSIS — I5030 Unspecified diastolic (congestive) heart failure: Secondary | ICD-10-CM | POA: Diagnosis not present

## 2017-06-06 DIAGNOSIS — I5032 Chronic diastolic (congestive) heart failure: Secondary | ICD-10-CM

## 2017-06-06 DIAGNOSIS — Z5111 Encounter for antineoplastic chemotherapy: Secondary | ICD-10-CM | POA: Diagnosis not present

## 2017-06-06 DIAGNOSIS — Z5189 Encounter for other specified aftercare: Secondary | ICD-10-CM | POA: Diagnosis not present

## 2017-06-06 DIAGNOSIS — Z95828 Presence of other vascular implants and grafts: Secondary | ICD-10-CM

## 2017-06-06 DIAGNOSIS — E119 Type 2 diabetes mellitus without complications: Secondary | ICD-10-CM | POA: Diagnosis not present

## 2017-06-06 DIAGNOSIS — C160 Malignant neoplasm of cardia: Secondary | ICD-10-CM | POA: Diagnosis not present

## 2017-06-06 LAB — CBC WITH DIFFERENTIAL/PLATELET
BASO%: 0.5 % (ref 0.0–2.0)
BASOS ABS: 0 10*3/uL (ref 0.0–0.1)
EOS ABS: 0.1 10*3/uL (ref 0.0–0.5)
EOS%: 1.5 % (ref 0.0–7.0)
HEMATOCRIT: 35.6 % — AB (ref 38.4–49.9)
HEMOGLOBIN: 10.7 g/dL — AB (ref 13.0–17.1)
LYMPH%: 15.5 % (ref 14.0–49.0)
MCH: 29.6 pg (ref 27.2–33.4)
MCHC: 30.1 g/dL — ABNORMAL LOW (ref 32.0–36.0)
MCV: 98.3 fL — AB (ref 79.3–98.0)
MONO#: 0.7 10*3/uL (ref 0.1–0.9)
MONO%: 10.9 % (ref 0.0–14.0)
NEUT%: 71.6 % (ref 39.0–75.0)
NEUTROS ABS: 4.4 10*3/uL (ref 1.5–6.5)
PLATELETS: 97 10*3/uL — AB (ref 140–400)
RBC: 3.62 10*6/uL — ABNORMAL LOW (ref 4.20–5.82)
RDW: 17.3 % — AB (ref 11.0–14.6)
WBC: 6.1 10*3/uL (ref 4.0–10.3)
lymph#: 1 10*3/uL (ref 0.9–3.3)

## 2017-06-06 LAB — COMPREHENSIVE METABOLIC PANEL
ALBUMIN: 3.2 g/dL — AB (ref 3.5–5.0)
ALK PHOS: 78 U/L (ref 40–150)
ALT: 41 U/L (ref 0–55)
ANION GAP: 7 meq/L (ref 3–11)
AST: 45 U/L — ABNORMAL HIGH (ref 5–34)
BILIRUBIN TOTAL: 0.7 mg/dL (ref 0.20–1.20)
BUN: 14.4 mg/dL (ref 7.0–26.0)
CALCIUM: 9.2 mg/dL (ref 8.4–10.4)
CO2: 30 mEq/L — ABNORMAL HIGH (ref 22–29)
Chloride: 103 mEq/L (ref 98–109)
Creatinine: 0.8 mg/dL (ref 0.7–1.3)
EGFR: 88 mL/min/{1.73_m2} — AB (ref >90)
Glucose: 149 mg/dl — ABNORMAL HIGH (ref 70–140)
POTASSIUM: 4.4 meq/L (ref 3.5–5.1)
Sodium: 140 mEq/L (ref 136–145)
TOTAL PROTEIN: 6.4 g/dL (ref 6.4–8.3)

## 2017-06-06 MED ORDER — FAMOTIDINE IN NACL 20-0.9 MG/50ML-% IV SOLN
20.0000 mg | Freq: Once | INTRAVENOUS | Status: AC
Start: 2017-06-06 — End: 2017-06-06
  Administered 2017-06-06: 20 mg via INTRAVENOUS

## 2017-06-06 MED ORDER — SODIUM CHLORIDE 0.9% FLUSH
10.0000 mL | INTRAVENOUS | Status: DC | PRN
  Administered 2017-06-06: 10 mL via INTRAVENOUS
  Filled 2017-06-06: qty 10

## 2017-06-06 MED ORDER — DIPHENHYDRAMINE HCL 50 MG/ML IJ SOLN
25.0000 mg | Freq: Once | INTRAMUSCULAR | Status: AC
  Administered 2017-06-06: 25 mg via INTRAVENOUS

## 2017-06-06 MED ORDER — SODIUM CHLORIDE 0.9% FLUSH
10.0000 mL | INTRAVENOUS | Status: DC | PRN
  Administered 2017-06-06: 10 mL
  Filled 2017-06-06: qty 10

## 2017-06-06 MED ORDER — CARBOPLATIN CHEMO INTRADERMAL TEST DOSE 100MCG/0.02ML
100.0000 ug | Freq: Once | INTRADERMAL | Status: AC
  Administered 2017-06-06: 100 ug via INTRADERMAL
  Filled 2017-06-06: qty 0.02

## 2017-06-06 MED ORDER — SODIUM CHLORIDE 0.9 % IV SOLN
Freq: Once | INTRAVENOUS | Status: AC
  Administered 2017-06-06: 14:00:00 via INTRAVENOUS

## 2017-06-06 MED ORDER — HEPARIN SOD (PORK) LOCK FLUSH 100 UNIT/ML IV SOLN
500.0000 [IU] | Freq: Once | INTRAVENOUS | Status: AC | PRN
  Administered 2017-06-06: 500 [IU]
  Filled 2017-06-06: qty 5

## 2017-06-06 MED ORDER — SODIUM CHLORIDE 0.9 % IV SOLN
150.0000 mg | Freq: Once | INTRAVENOUS | Status: AC
  Administered 2017-06-06: 150 mg via INTRAVENOUS
  Filled 2017-06-06: qty 15

## 2017-06-06 MED ORDER — PEGFILGRASTIM 6 MG/0.6ML ~~LOC~~ PSKT
6.0000 mg | PREFILLED_SYRINGE | Freq: Once | SUBCUTANEOUS | Status: AC
  Administered 2017-06-06: 6 mg via SUBCUTANEOUS
  Filled 2017-06-06: qty 0.6

## 2017-06-06 MED ORDER — PALONOSETRON HCL INJECTION 0.25 MG/5ML
0.2500 mg | Freq: Once | INTRAVENOUS | Status: AC
Start: 2017-06-06 — End: 2017-06-06
  Administered 2017-06-06: 0.25 mg via INTRAVENOUS

## 2017-06-06 MED ORDER — PACLITAXEL CHEMO INJECTION 300 MG/50ML
80.0000 mg/m2 | Freq: Once | INTRAVENOUS | Status: AC
  Administered 2017-06-06: 204 mg via INTRAVENOUS
  Filled 2017-06-06: qty 34

## 2017-06-06 MED ORDER — DEXAMETHASONE SODIUM PHOSPHATE 10 MG/ML IJ SOLN
INTRAMUSCULAR | Status: AC
  Filled 2017-06-06: qty 1

## 2017-06-06 MED ORDER — FAMOTIDINE IN NACL 20-0.9 MG/50ML-% IV SOLN
INTRAVENOUS | Status: AC
  Filled 2017-06-06: qty 50

## 2017-06-06 MED ORDER — DEXAMETHASONE SODIUM PHOSPHATE 10 MG/ML IJ SOLN
10.0000 mg | Freq: Once | INTRAMUSCULAR | Status: AC
  Administered 2017-06-06: 10 mg via INTRAVENOUS

## 2017-06-06 MED ORDER — DIPHENHYDRAMINE HCL 50 MG/ML IJ SOLN
INTRAMUSCULAR | Status: AC
  Filled 2017-06-06: qty 1

## 2017-06-06 MED ORDER — PALONOSETRON HCL INJECTION 0.25 MG/5ML
INTRAVENOUS | Status: AC
  Filled 2017-06-06: qty 5

## 2017-06-06 NOTE — Progress Notes (Signed)
[place]  [phone] [phone]  Clinic Follow up Note   Patient Care Team: Tammi Sou, MD as PCP - General (Family Medicine) Harold Hedge, Darrick Grinder, MD as Consulting Physician (Allergy and Immunology) Shirley Muscat Loreen Freud, MD as Consulting Physician (Optometry) Sueanne Margarita, MD as Consulting Physician (Cardiology) Pyrtle, Lajuan Lines, MD as Consulting Physician (Gastroenterology) Latanya Maudlin, MD as Consulting Physician (Orthopedic Surgery) Jodi Marble, MD as Consulting Physician (Otolaryngology) Truitt Merle, MD as Consulting Physician (Hematology) Kyung Rudd, MD as Consulting Physician (Radiation Oncology) 06/06/2017     CHIEF COMPLAINTS:  Follow up GE junction cancer  Oncology History   Cancer of cardio-esophageal junction [facility])   Staging form: Stomach, AJCC 7th Edition     Clinical stage from 05/04/2016: Stage IV ([name], N2, M1) - Signed by Truitt Merle, MD on 05/18/2016        Cancer of cardio-esophageal junction ([place])   05/04/2016 Initial Diagnosis    Cancer of cardio-esophageal junction ([place])      05/04/2016 Procedure    EGD showed a large ulcerating mass with no active bleeding at the gastroesophageal junction extending into the gastric cardia. The mass was not obstructing and partially circumferential, extends approximately 5 cm. Nonbleeding erosive gastropathy.      05/04/2016 Initial Biopsy    Esophageal gastric junction biopsy showed a poorly differentiated carcinoma underlying the squamous mucosa. There is lymphovascular invasion. No Intestinal metaplasia. IHC weakly positive CK5/6, p63 (-), favor squamous.        05/09/2016 Imaging    CT CAP w contrast showed mild wall thickening involving the distal esophagus and proximal stomach compatible with known cancer, enlarged mediastinal and upper abdominal lymph nodes are highly suspicious for metastatic adenopathy, propable liver cirrhosis.      06/04/2016 - 06/22/2016 Radiation  Therapy    palliative radiation to esophageal cancer       07/06/2016 -  Chemotherapy    chemotherapy with weekly carboplatin and taxol, started on 07/06/2016, held 08/21/16-09/28/2016 due to hospitalization, changed to every 2 weeks from 11/30/2016      07/30/2016 Pathology Results    PD-L1 negative expression      08/28/2016 - 08/31/2016 Hospital Admission    The patient was admitted to 4 severe hypoglycemia, dehydration, and acute renal failure. Infection workup was negative. He recovered well with supportive care. His insulin and hypertension medication was held on discharge, except insulin sliding scale.      09/24/2016 Imaging    CT CAP W CONTRAST 09/26/2016 IMPRESSION: Decreased masslike soft tissue prominence at gastroesophageal junction, consistent with decreased size of primary gastroesophageal junction carcinoma. Resolution of paraesophageal and gastrohepatic ligament lymphadenopathy since prior exam. Other sub-cm mediastinal lymph nodes show little or no significant change. Stable indeterminate sub-cm low-attenuation lesion in the liver dome. Probable hepatic cirrhosis. Recommend continued attention on follow-up CT. No new or progressive metastatic disease identified. Cholelithiasis.  No radiographic evidence of cholecystitis. Colonic diverticulosis. No radiographic evidence of diverticulitis. Aortic atherosclerosis and three-vessel coronary artery calcification.      01/07/2017 Imaging    CT CAP w contrast 1. No residual esophageal mass or adenopathy. 2. New nodular airspace opacification in the left lower lobe. Continued attention on followup exams is warranted as metastatic disease cannot be definitively excluded. 3. Cirrhosis. 4. Trace left pleural effusion. 5. Aortic atherosclerosis (ICD10-170.0). Coronary artery calcification. 6. Cholelithiasis. 7. Mild basilar predominant subpleural reticular densities. Difficult to exclude interstitial lung disease.       04/29/2017 Imaging  CT CAP w contrast  IMPRESSION: 1. No evidence of metastatic disease. 2. Nodular airspace disease previously seen in the left lower lobe has resolved in the interval. 3. Tiny right pleural effusion, new. Small left pleural effusion, slightly increased. 4. Cirrhosis with splenomegaly. 5.  Aortic atherosclerosis (ICD10-170.0).       HISTORY OF PRESENTING ILLNESS:  [name] 71 y.o. male is here because of His reason that diagnosed GE junction carcinoma. He is a comment of by his wife and daughter to our multidisciplinary chart clinic today.  He has been having progressive dysphagia and odynophagia for 2 month, he has been eating soft diet only in the past one week. He has pain in the mid chest and epigastric area only when he eats, with burning sensation, no nausea, abdominal bloating, or other discomfort. His appetite has remained well, no weight loss,   He has multiple medical problems, especially heart failure, he has been on oxygen continuously for 5-6 years. He is morbidly obese, has diabetes, hypertension, mild neuropathy, OSA etc. He has very sedentary life style, he sits in the chair and watch TV most of time during the day. He only comes out for shopping for a few times a week. He uses a Barr[employer], he can walk for a few hundrends feet before he has to stop to catch his breasts. He denies cough or sputum production, no GI discomfort, he has been having mild dirrhea, loose stool, twice daily, no melena or hematochezia.  CURRENT THERAPY: chemotherapy with weekly carboplatin and taxol, started on 07/06/2016, held 08/21/16-09/28/2016 due to hospitalization, changed to every 2 weeks from 11/30/2016; Due to moderate some cytopenia, I'll reduce carboplatin AUC to 1.0, continue Taxol 70 mg/m, continue chemotherapy every 2 weeks (03/06/17). Add Onpro patch starting 04/08/17   INTERIM HISTORY:  [name] returns for follow-up. He presents to the clinic today with his  wife. From his last treatment, he did not notice much of a difference. He is currently doing well, and denies any pain, nausea, fatigue, swelling.      MEDICAL HISTORY:  Past Medical History:  Diagnosis Date  . Asthma    as a child  . Cataract    multiple types, bilateral  . Cholelithiasis without cholecystitis   . Chronic diastolic CHF (congestive heart failure) ([place]) 02/25/2009  . Chronic renal insufficiency, stage III (moderate) 2017   Stage II/III (GFR around 60)  . 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 COPD (chronic obstructive pulmonary disease) ([place])   . Coronary artery disease    chronically occluded LAD and diagonal with right to left collaterals, mild disease in the left circ and moderate disease in the mid RCA on medical management with Imdur, ASA, and Plavix.  . Dermatitis 05/2014  . DIABETES MELLITUS, TYPE II 06/24/2007   No diab retpthy as of 08/05/15 eye exam.  . Esophageal cancer ([place]) 04/2016   poorly differentiated carcinoma (Dr. Pyrtle--EGD).  CT C/A/P showed metastatic adenopathy in mediastinum and upper abdomen 05/09/16.  Tx plan is palliative radiation (completed 06/22/16), then palliative systemic chemotherapy (carbo+taxol) was started but as of 08/03/16 onc f/u this was held due to severe knee and ankle arthralgias.  Restarting as of 10/2015 (08/2016 CT showed some disease regression  . Esophageal cancer ([place])    CT 12/2016 showed no residual esoph mass--plan to continue chemo.  Ongoing palliative chemo with carboplatin and taxol q 2  wks as of 03/2017 onc f/u (CT 12/2016 and 04/2017 showed no progression of dz).  . Essential hypertension 05/01/2007   Qualifier: Diagnosis of  By: Tiney Rouge CMA, Ellison Hughs     . History of kidney stones   . Hyperkalemia 12/2015   Decreased ACE-I by 50% in response, then potassium normalized.  Marland Kitchen HYPERLIPIDEMIA 06/24/2007  . Hypoxemia 01/27/2010  . Myocardial infarction [facility])    pt  states he was informed per MD that he has had one but pt was unaware   . OBESITY 09/02/2008  . Obesity hypoventilation syndrome (HCC)    oxygen 24/7 (2 liters  as of 02/2016)  . On home oxygen therapy    Oxygen @ 2l/m nasally 24/7 hours  . OSA (obstructive sleep apnea)    not tested; pt scored 4 per stop bang tool results sent to PCP   . OSTEOARTHRITIS 05/01/2007  . Pancytopenia due to chemotherapy ([place]) 2018  . Presbycusis of both ears 05/2015   [facility]  . Pruritic condition 05/2014   Allergist summer 2015, no new testing.  [name] field defect 05/10/16   Per Dr. Melina Fiddler, O.D.: Rt, loss of inf/temp quad and some loss sup/temp.  ?CVA  ? Pituitary tumor? ?Brain met.    SURGICAL HISTORY: Past Surgical History:  Procedure Laterality Date  . APPENDECTOMY    . BALLOON DILATION N/A 05/04/2016   Procedure: BALLOON DILATION;  Surgeon: Jerene Bears, MD;  Location: [facility];  Service: Gastroenterology;  Laterality: N/A;  . CARDIAC CATHETERIZATION    . CARPAL TUNNEL RELEASE Left   . CATARACT EXTRACTION W/PHACO Right 04/29/2013   Procedure: CATARACT EXTRACTION PHACO AND INTRAOCULAR LENS PLACEMENT (IOC);  Surgeon: Adonis Brook, MD;  Location: [facility];  Service: Ophthalmology;  Laterality: Right;  . CATARACT EXTRACTION, BILATERAL    . COLONOSCOPY W/ POLYPECTOMY  08/2011   Many polyps--all hyperplastic, severe diverticulosis, int hem.  [employer] hemoccult testing via [facility] 06/22/15 NEG  . ESOPHAGOGASTRODUODENOSCOPY N/A 05/04/2016   Procedure: ESOPHAGOGASTRODUODENOSCOPY (EGD);  Surgeon: Jerene Bears, MD;  Location: [facility];  Service: Gastroenterology;  Laterality: N/A;  . IR GENERIC HISTORICAL  10/03/2016   IR US GUIDE VASC ACCESS RIGHT 10/03/2016 Greggory Keen, MD [facility]  . IR GENERIC HISTORICAL  10/03/2016   IR FLUORO GUIDE PORT INSERTION RIGHT 10/03/2016 Greggory Keen, MD [facility]  . KNEE ARTHROSCOPY WITH MEDIAL MENISECTOMY Right 12/16/2014   Procedure: RIGHT KNEE ARTHROSCOPY WITH MEDIAL  MENISECTOMY microfracture medial femoral condyle abrasion condroplasty medial femoral condyle lateral menisectomy;  Surgeon: Tobi Bastos, MD;  Location: [facility];  Service: Orthopedics;  Laterality: Right;  . LEFT HEART CATHETERIZATION WITH CORONARY ANGIOGRAM N/A 10/26/2013   Procedure: LEFT HEART CATHETERIZATION WITH CORONARY ANGIOGRAM;  Surgeon: Jettie Booze, MD;  Location: [facility] CATH LAB;  Service: Cardiovascular;  Laterality: N/A;  . LUMBAR [place] SURGERY  2001  . SHOULDER SURGERY Right    right fx  . TONSILLECTOMY    . TRANSTHORACIC ECHOCARDIOGRAM  03/2016   EF 55-60%, grade I DD, LAE  . WRIST SURGERY Right    right fx    SOCIAL HISTORY: Social History   Social History  . Marital status: Married    Spouse name: susan  . Number of children: 2  . Years of education: N/A   Occupational History  . Retired    Social History Main Topics  . Smoking status: Former Smoker    Packs/day: 1.50    Years: 30.00    Types: Cigarettes,  Pipe, Cigars    Quit date: 10/30/1983  . Smokeless tobacco: Former Systems developer    Types: Chew    Quit date: 05/15/2016  . Alcohol use No  . Drug use: No  . Sexual activity: Not Currently   Other Topics Concern  . Not on file   Social History Narrative   Married, one son and one daughter.   His daughter and her two children live with him.   Coffee daily.  Former smoker.  No alcohol.   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   Attends church weekly-   Oxygen continuous       FAMILY HISTORY: Family History  Problem Relation Age of Onset  . Heart attack Mother   . Aneurysm Father   . Alcohol abuse Father   . Diabetes Maternal Aunt   . Colon cancer Neg Hx     ALLERGIES:  has No Known Allergies.  MEDICATIONS:  Current Outpatient Prescriptions  Medication Sig Dispense Refill  . aspirin 81 MG tablet Take 81 mg by mouth daily.      Marland Kitchen atorvastatin (LIPITOR) 80 MG tablet TAKE ONE TABLET BY MOUTH ONCE DAILY 90 tablet 2    . beta carotene w/minerals (OCUVITE) tablet Take 1 tablet by mouth daily.    . cetirizine (ZYRTEC) 10 MG tablet Take 10 mg by mouth daily.    . citalopram (CELEXA) 20 MG tablet TAKE ONE TABLET BY MOUTH ONCE DAILY 90 tablet 1  . clonazePAM (KLONOPIN) 1 MG tablet TAKE ONE TABLET BY MOUTH TWICE DAILY AS NEEDED FOR ANXIETY 60 tablet 5  . cromolyn (OPTICROM) 4 % ophthalmic solution Place 1 drop into both eyes 4 (four) times daily.    Marland Kitchen ezetimibe (ZETIA) 10 MG tablet Take 1 tablet (10 mg total) by mouth daily. 90 tablet 3  . gabapentin (NEURONTIN) 300 MG capsule TAKE ONE CAPSULE BY MOUTH THREE TIMES DAILY 270 capsule 3  . hydrOXYzine (ATARAX/VISTARIL) 25 MG tablet TAKE 1 TABLET BY MOUTH AT SUPPER AND 1 TAB AT BEDTIME FOR INSOMNIA 60 tablet 6  . Insulin Lispro (HUMALOG KWIKPEN) 200 UNIT/ML SOPN Inject 20 Units into the skin 2 (two) times daily with a meal. (Patient taking differently: Inject 16 Units into the skin 2 (two) times daily with a meal. Takes  16 units  Twice  Daily.) 18 pen 6  . Insulin NPH, Human,, Isophane, (HUMULIN N) 100 UNIT/ML Kiwkpen 16 units every morning and 16 units every evening 15 mL 3  . magnesium chloride (SLOW-MAG) 64 MG TBEC SR tablet Take 1 tablet (64 mg total) by mouth daily. 60 tablet 0  . metFORMIN (GLUCOPHAGE) 1000 MG tablet TAKE ONE TABLET BY MOUTH TWICE DAILY WITH MEALS 180 tablet 1  . Omega-3 Fatty Acids ([employer] FISH OIL) 1000 MG CAPS Take 2 tablets by mouth 2 (two) times daily. 90 capsule   . ondansetron (ZOFRAN) 8 MG tablet Take 1 tablet (8 mg total) by mouth 2 (two) times daily as needed for refractory nausea / vomiting. Start on day 3 after chemo. 30 tablet 1  . ONE TOUCH ULTRA TEST test strip USE  STRIP TO CHECK GLUCOSE THREE TIMES DAILY AS  DIRECTED 100 each 11  . ONETOUCH DELICA LANCETS 16X MISC USE ONE  TO CHECK GLUCOSE THREE TIMES DAILY 100 each 11  . oxyCODONE (OXY IR/ROXICODONE) 5 MG immediate release tablet Take 1 tablet (5 mg total) by mouth every 8 (eight)  hours as needed for severe pain. [place]  tablet 0  . OXYGEN Inhale 2 L/min into the lungs continuous.    . pantoprazole (PROTONIX) 40 MG tablet TAKE 1 TABLET BY MOUTH TWICE DAILY 60 tablet 2  . prochlorperazine (COMPAZINE) 10 MG tablet Take 1 tablet (10 mg total) by mouth every 6 (six) hours as needed (Nausea or vomiting). 30 tablet 1   No current facility-administered medications for this visit.    Facility-Administered Medications Ordered in Other Visits  Medication Dose Route Frequency Provider Last Rate Last Dose  . 0.9 %  sodium chloride infusion   Intravenous Once Truitt Merle, MD      . CARBOplatin CHEMO intradermal Test Dose 100 mcg/0.24m  100 mcg Intradermal Once FTruitt Merle MD      . dexamethasone (DECADRON) injection 10 mg  10 mg Intravenous Once FTruitt Merle MD      . diphenhydrAMINE (BENADRYL) injection 25 mg  25 mg Intravenous Once FTruitt Merle MD      . famotidine (PEPCID) IVPB 20 mg premix  20 mg Intravenous Once FTruitt Merle MD      . heparin lock flush 100 unit/mL  500 Units Intracatheter Once PRN FTruitt Merle MD      . PACLitaxel (TAXOL) 204 mg in dextrose 5 % 250 mL chemo infusion (</= 872mm2)  80 mg/m2 (Treatment Plan Recorded) Intravenous Once FeTruitt MerleMD      . palonosetron (ALOXI) injection 0.25 mg  0.25 mg Intravenous Once FeTruitt MerleMD      . pegfilgrastim (NEULASTA ONPRO KIT) injection 6 mg  6 mg Subcutaneous Once FeTruitt MerleMD      . sodium chloride flush (NS) 0.9 % injection 10 mL  10 mL Intravenous PRN FeTruitt MerleMD   10 mL at 04/16/17 1459  . sodium chloride flush (NS) 0.9 % injection 10 mL  10 mL Intravenous PRN FeTruitt MerleMD   10 mL at 05/02/17 1725  . sodium chloride flush (NS) 0.9 % injection 10 mL  10 mL Intracatheter PRN FeTruitt MerleMD        REVIEW OF SYSTEMS:  Constitutional: Denies fevers, abnormal night sweats  Eyes: Denies blurriness of vision, double vision or watery eyes Ears, nose, mouth, throat, and face: Denies mucositis or sore throat, Respiratory: Denies  cough, dyspnea or wheezes Cardiovascular: Denies palpitation, chest discomfort or lower extremity swelling Gastrointestinal:  Denies nausea, heartburn or change in bowel habits  Skin: Denies abnormal skin rashes (+)skin flushing  Lymphatics: Denies new lymphadenopathy or easy bruising Neurological:Denies numbness, tingling   Behavioral/Psych: Mood is stable, no new changes  All other systems were reviewed with the patient and are negative.  PHYSICAL EXAMINATION:  ECOG PERFORMANCE STATUS: 3 - Symptomatic, >50% confined to bed  Vitals:   06/06/17 1330  BP: (!) 123/58  Pulse: 91  Resp: (!) 100  Temp: 97.8 F (36.6 C)  SpO2: 100%   Filed Weights   06/06/17 1330  Weight: 279 lb 4.8 oz (126.7 kg)     GENERAL:alert, no distress and comfortable, morbid obese, sitting in wheelchair with nasal cannula oxygen  SKIN: skin color, texture, turgor are normal, no rashes or significant lesions EYES: normal, conjunctiva are pink and non-injected, sclera clear OROPHARYNX:no exudate, no erythema and lips, buccal mucosa, and tongue normal  NECK: supple, thyroid normal size, non-tender, without nodularity LYMPH:  no palpable lymphadenopathy in the cervical, axillary or inguinal LUNGS: clear, HEART: regular rate & rhythm and no murmurs and no lower extremity edema ABDOMEN:abdomen soft, non-tender and normal bowel sounds  Musculoskeletal:no cyanosis of digits and no clubbing  PSYCH: alert & oriented x 3 with fluent speech NEURO: no focal motor/sensory deficits EXT: Trace edema, he wears compression stocks, no significant ankle swollen, erythema, or warmness.   LABORATORY DATA:  I have reviewed the data as listed CBC Latest Ref Rng & Units 06/06/2017 05/15/2017 05/02/2017  WBC 4.0 - 10.3 10e3/uL 6.1 9.3 8.4  Hemoglobin 13.0 - 17.1 g/dL 10.7(L) 9.6(L) 9.5(L)  Hematocrit 38.4 - 49.9 % 35.6(L) 31.8(L) 31.4(L)  Platelets 140 - 400 10e3/uL 97(L) 70(L) 103(L)   CMP Latest Ref Rng & Units 06/06/2017  05/15/2017 05/02/2017  Glucose 70 - 140 mg/dl 149(H) 177(H) 132  BUN 7.0 - 26.0 mg/dL 14.4 9.1 7.9  Creatinine 0.7 - 1.3 mg/dL 0.8 0.8 0.8  Sodium 136 - 145 mEq/L 140 140 139  Potassium 3.5 - 5.1 mEq/L 4.4 3.8 4.0  Chloride 96 - 106 mmol/L - - -  CO2 22 - 29 mEq/L 30(H) 26 29  Calcium 8.4 - 10.4 mg/dL 9.2 8.7 8.7  Total Protein 6.4 - 8.3 g/dL 6.4 6.0(L) 6.1(L)  Total Bilirubin 0.20 - 1.20 mg/dL 0.70 0.62 0.67  Alkaline Phos 40 - 150 U/L 78 115 92  AST 5 - 34 U/L 45(H) 63(H) 37(H)  ALT 0 - 55 U/L 41 61(H) 41   PATHOLOGY REPORT Diagnosis 05/04/2016 1. Esophagogastric junction, biopsy, ulcerative mass - POORLY DIFFERENTIATED CARCINOMA, SEE COMMENT. 2. Stomach, biopsy - REACTIVE GASTROPATHY. - NEGATIVE FOR HELICOBACTER PYLORI. - NO INTESTINAL METAPLASIA, DYSPLASIA, OR MALIGNANCY. Microscopic Comment 1. The majority of the specimen consists of gastroesophageal mucosa with reflux changes. There is a small focus of tumor underlying the squamous mucosa. There is lymphovascular invasion. There is no background intestinal metaplasia (Barrett's esophagus). Immunohistochemistry reveals only very focal weak cytokeratin 5/6, negative p63, and negative mucicarmine in the limited remaining tumor. Dr. Saralyn Pilar has reviewed the case. The case was called to Dr. Hilarie Fredrickson on 05/08/2016. 2. A Warthin-Starry stain is performed to determine the possibility of the presence of Helicobacter pylori. The Warthin-Starry stain is negative for organisms of Helicobacter pylori.     RADIOGRAPHIC STUDIES: I have personally reviewed the radiological images as listed and agreed with the findings in the report.  CT CAP w contrast 04/29/2017 IMPRESSION: 1. No evidence of metastatic disease. 2. Nodular airspace disease previously seen in the left lower lobe has resolved in the interval. 3. Tiny right pleural effusion, new. Small left pleural effusion, slightly increased. 4. Cirrhosis with splenomegaly. 5.  Aortic  atherosclerosis (ICD10-170.0).   CT CAP w contrast 01/07/2017 IMPRESSION: 1. No residual esophageal mass or adenopathy. 2. New nodular airspace opacification in the left lower lobe. Continued attention on followup exams is warranted as metastatic disease cannot be definitively excluded. 3. Cirrhosis. 4. Trace left pleural effusion. 5. Aortic atherosclerosis (ICD10-170.0). Coronary artery calcification. 6. Cholelithiasis. 7. Mild basilar predominant subpleural reticular densities. Difficult to exclude interstitial lung disease.  EGD 05/04/2016 A large, ulcerating mass with no active bleeding and no stigmata of recent bleeding was found at the gastroesophageal junction extending into the gastric cardia, beginning 40 cm from the incisors. The mass was non-obstructing and partially circumferential (involving one-half of the lumen circumference). The mass extends approximately 5 cm. IMPRESSION:  -Likely malignant esophageal tumor was found at the gastroesophageal junction extending into the gastric cardia. Biopsied. - Non-bleeding erosive gastropathy. Biopsied. - Erythematous duodenopathy. - Normal second portion of the duodenum.  ASSESSMENT & PLAN: 71 y.o. Caucasian male, with multiple comorbidities, including hypertension, diabetes,  OSA, coronary artery disease, diastolic CHF, on continuous oxygen, morbid obesity, presented with progressive dysphagia and odynophagia.  1. Cancer of cardio-esophageal junction, poorly differentiated, cTxN2M1 with node metastasis, stage IV  -I previously reviewed his CT scan, EGD, and the biopsy results with patient and his family members in great details. -His biopsy pathology was previously reviewed in our tumor board, the morphology and weakly positive for CK 5/6, fevers squamous cell carcinoma -His CT scan findings are very concerning for distant metastasis to abdominal lymph nodes.  -I previously reviewed the PET scan images with patient and his  daughter in person, he has hypermetabolic GE junction tumor, and diffuse adenopathy in mediastinum and upper abdomen. -We previously discussed the aggressive nature of esophageal cancer, an incurable nature of his disease due to the distant metastasis. The goal of therapy is palliative, to prolong his life and palliate his symptoms. -His tumor was negative for PD-L1, not a candidate for immunotherapy  -He is on first line palliative chemotherapy with weekly carbo and Taxol, every 2 weeks, tolerating low dose better lately. -I previously reviewed his CT scan findings on 09/26/2016 show good partial response to chemoradiation, no new or progressive metastatic disease. - I previously  reviewed his restaging CT scan from 01/07/2017,  Which showed no residual esophageal mass or adenopathy, he has mild new infiltrative change in LLL, possible related to aspiration, he is asymptomatic, will continue monitoring  -He is clinically doing well, tolerating chemotherapy well, lab results reviewed with patient, mild pancytopenia, secondary to chemotherapy, adequate for treatment, we'll continue low-dose carboplatin and Taxol every 2 weeks. Due to the thrombocytopenia, I have decreased his carboplatin to AUC 1.5 previously. -I reviewed his restaging CT scan from 04/29/2017 which showed no evidence of primary or metastatic cancer. -- Labs reviewed and his plt count is 97K today, we'll proceed carboplatin with dose reduction to AUC 1.0, and the Taxol today. -He has been on chemotherapy since early September 2017, lost a few scan showed no evidence of disease, we discussed the option of holding chemotherapy for 2 months, and repeat scan, versus continued chemotherapy. After a lengthy discussion, patient decided to stop chemotherapy after the next treatment for observation. We discussed the possibility of tumor progression after chemotherapy break. Since he has had excellent response (completed radiographic response), I think  the reasonable to give him a chemotherapy break, and observation him as long as no cancer progression.  2. CAD, diastolic CHF, on continuous oxygen -He will continue follow-up with his cardiologist Dr. Radford Pax -His Plavix has been held since the EGD and biopsy, he is on baby aspirin. -Need to watch his fluid intake during his chemotherapy, and also avoid fluid overload from chemo  -he is clinically stable  3. DM, HTN -We previously discussed his blood pressure and glucose need to be monitored closely during the chemotherapy, which may affect his blood glucose and blood pressure -He will continue medication, monitor his sugar and blood pressure at home, and follow-up with his primary care physician. -will decrease premed dexa before taxol  -History simple has been held due to his borderline low blood pressure -He will see his primary care physician Dr. Anitra Lauth for diabetes management.  4. OSA, morbid obesity -We previously discussed healthy diet, I encouraged him to to be more physically active, and consider home PT -previously I strongly encouraged him to follow-up with dietitian during the therapy for nutrition support.  5. Arthralgia -He has baseline osteoarthritis, not physically active -However his arthralgia in his  knees and ankle are much worse after chemotherapy but resolved now  -He will use oxycodone 5 mg as needed, constipation management previously reviewed with him. -Leg weakness improved with physical therapy. Previously encouraged the patient to continue with physical therapy.   6. Pancytopenia  -Second is to underlying malignancy and chemotherapy -Mild anemia is stable, his hemoglobin is 10.0 previously, thrombocytopenia worse, platelet 88K previously, we reduced chemotherapy dose -we'll consider blood transfusion if hemoglobin less than 8, for symptomatic anemia.  7. Goal of care discussion, DNR/DNI -We previously discussed the incurable nature of his cancer, and the  overall poor prognosis, especially if he does not have good response to chemotherapy or progress on chemo -The patient understands the goal of care is palliative. -Patient is very realistic, understands his cancer is not curable, and he agrees with DO NOT RESUSCITATE and DO NOT INTUBATE   PLAN -Labs reviewed and plt counts are 97 today.  He is adequate for treatment and will continue with Taxol and carbo AUC 1.0 due to thrombocytopenia , next treatment in 2 weeks -f/u in 2 weeks, plan to hold chemo after next cycle, and repeat CT scan in 2 months    All questions were answered. The patient knows to call the clinic with any problems, questions or concerns.  I spent 20 minutes counseling the patient face to face. The total time spent in the appointment was 25 minutes and more than 35% was on counseling.  This document serves as a record of services personally performed by Truitt Merle, MD. It was created on her behalf by Brandt Loosen, a trained medical scribe. The creation of this record is based on the scribe's personal observations and the provider's statements to them. This document has been checked and approved by the attending provider.

## 2017-06-06 NOTE — Progress Notes (Signed)
Per Dr. Burr Medico,  [place] to treat with platelet count 97 - with decreased Carbo dose.

## 2017-06-06 NOTE — Progress Notes (Signed)
Per Dr Burr Medico, [facility] to tx today platelets 97, carboplatin dosage reduced.

## 2017-06-06 NOTE — Patient Instructions (Signed)
[facility] Discharge Instructions for Patients Receiving Chemotherapy  Today you received the following chemotherapy agents Taxol/Carboplatin  To help prevent nausea and vomiting after your treatment, we encourage you to take your nausea medication    If you develop nausea and vomiting that is not controlled by your nausea medication, call the clinic.   BELOW ARE SYMPTOMS THAT SHOULD BE REPORTED IMMEDIATELY:  *FEVER GREATER THAN 100.5 F  *CHILLS WITH OR WITHOUT FEVER  NAUSEA AND VOMITING THAT IS NOT CONTROLLED WITH YOUR NAUSEA MEDICATION  *UNUSUAL SHORTNESS OF BREATH  *UNUSUAL BRUISING OR BLEEDING  TENDERNESS IN MOUTH AND THROAT WITH OR WITHOUT PRESENCE OF ULCERS  *URINARY PROBLEMS  *BOWEL PROBLEMS  UNUSUAL RASH Items with * indicate a potential emergency and should be followed up as soon as possible.  Feel free to call the clinic you have any questions or concerns. The clinic phone number is [phone].  Please show the CHEMO ALERT CARD at check-in to the Emergency Department and triage nurse.   

## 2017-06-07 DIAGNOSIS — J449 Chronic obstructive pulmonary disease, unspecified: Secondary | ICD-10-CM | POA: Diagnosis not present

## 2017-06-12 ENCOUNTER — Encounter: Payer: Self-pay | Admitting: Family Medicine

## 2017-06-13 ENCOUNTER — Other Ambulatory Visit: Payer: Medicare Other

## 2017-06-13 ENCOUNTER — Ambulatory Visit: Payer: Medicare Other

## 2017-06-19 ENCOUNTER — Other Ambulatory Visit: Payer: Self-pay | Admitting: Family Medicine

## 2017-06-19 MED ORDER — CLOTRIMAZOLE-BETAMETHASONE 1-0.05 % EX CREA: TOPICAL_CREAM | CUTANEOUS | 2 refills | Status: DC

## 2017-06-20 ENCOUNTER — Other Ambulatory Visit (HOSPITAL_BASED_OUTPATIENT_CLINIC_OR_DEPARTMENT_OTHER): Payer: Medicare Other

## 2017-06-20 ENCOUNTER — Ambulatory Visit (HOSPITAL_BASED_OUTPATIENT_CLINIC_OR_DEPARTMENT_OTHER): Payer: Medicare Other

## 2017-06-20 ENCOUNTER — Ambulatory Visit (HOSPITAL_BASED_OUTPATIENT_CLINIC_OR_DEPARTMENT_OTHER): Payer: Medicare Other | Admitting: Hematology

## 2017-06-20 VITALS — BP 116/68 | HR 92 | Temp 97.8°F | Resp 20

## 2017-06-20 DIAGNOSIS — E119 Type 2 diabetes mellitus without complications: Secondary | ICD-10-CM | POA: Diagnosis not present

## 2017-06-20 DIAGNOSIS — D61810 Antineoplastic chemotherapy induced pancytopenia: Secondary | ICD-10-CM

## 2017-06-20 DIAGNOSIS — C160 Malignant neoplasm of cardia: Secondary | ICD-10-CM

## 2017-06-20 DIAGNOSIS — I5032 Chronic diastolic (congestive) heart failure: Secondary | ICD-10-CM

## 2017-06-20 DIAGNOSIS — Z5111 Encounter for antineoplastic chemotherapy: Secondary | ICD-10-CM | POA: Diagnosis not present

## 2017-06-20 DIAGNOSIS — I10 Essential (primary) hypertension: Secondary | ICD-10-CM

## 2017-06-20 DIAGNOSIS — Z5189 Encounter for other specified aftercare: Secondary | ICD-10-CM | POA: Diagnosis not present

## 2017-06-20 LAB — COMPREHENSIVE METABOLIC PANEL
ALBUMIN: 3.1 g/dL — AB (ref 3.5–5.0)
ALK PHOS: 103 U/L (ref 40–150)
ALT: 57 U/L — AB (ref 0–55)
ANION GAP: 9 meq/L (ref 3–11)
AST: 47 U/L — AB (ref 5–34)
BILIRUBIN TOTAL: 0.66 mg/dL (ref 0.20–1.20)
BUN: 11.4 mg/dL (ref 7.0–26.0)
CALCIUM: 9 mg/dL (ref 8.4–10.4)
CO2: 28 mEq/L (ref 22–29)
CREATININE: 0.8 mg/dL (ref 0.7–1.3)
Chloride: 103 mEq/L (ref 98–109)
EGFR: 88 mL/min/{1.73_m2} — ABNORMAL LOW (ref >90)
Glucose: 137 mg/dl (ref 70–140)
Potassium: 4 mEq/L (ref 3.5–5.1)
Sodium: 140 mEq/L (ref 136–145)
Total Protein: 6.4 g/dL (ref 6.4–8.3)

## 2017-06-20 LAB — CBC WITH DIFFERENTIAL/PLATELET
BASO%: 0.4 % (ref 0.0–2.0)
BASOS ABS: 0 10*3/uL (ref 0.0–0.1)
EOS%: 2 % (ref 0.0–7.0)
Eosinophils Absolute: 0.2 10*3/uL (ref 0.0–0.5)
HEMATOCRIT: 33.9 % — AB (ref 38.4–49.9)
HEMOGLOBIN: 10.2 g/dL — AB (ref 13.0–17.1)
LYMPH%: 11 % — ABNORMAL LOW (ref 14.0–49.0)
MCH: 29.6 pg (ref 27.2–33.4)
MCHC: 30.1 g/dL — AB (ref 32.0–36.0)
MCV: 98.3 fL — ABNORMAL HIGH (ref 79.3–98.0)
MONO#: 0.6 10*3/uL (ref 0.1–0.9)
MONO%: 7.7 % (ref 0.0–14.0)
NEUT#: 6.6 10*3/uL — ABNORMAL HIGH (ref 1.5–6.5)
NEUT%: 78.9 % — AB (ref 39.0–75.0)
PLATELETS: 93 10*3/uL — AB (ref 140–400)
RBC: 3.45 10*6/uL — ABNORMAL LOW (ref 4.20–5.82)
RDW: 17.3 % — AB (ref 11.0–14.6)
WBC: 8.3 10*3/uL (ref 4.0–10.3)
lymph#: 0.9 10*3/uL (ref 0.9–3.3)

## 2017-06-20 MED ORDER — FAMOTIDINE IN NACL 20-0.9 MG/50ML-% IV SOLN
20.0000 mg | Freq: Once | INTRAVENOUS | Status: AC
  Administered 2017-06-20: 20 mg via INTRAVENOUS

## 2017-06-20 MED ORDER — CARBOPLATIN CHEMO INTRADERMAL TEST DOSE 100MCG/0.02ML
100.0000 ug | Freq: Once | INTRADERMAL | Status: AC
  Administered 2017-06-20: 100 ug via INTRADERMAL
  Filled 2017-06-20: qty 0.02

## 2017-06-20 MED ORDER — DIPHENHYDRAMINE HCL 50 MG/ML IJ SOLN
INTRAMUSCULAR | Status: AC
  Filled 2017-06-20: qty 1

## 2017-06-20 MED ORDER — PACLITAXEL CHEMO INJECTION 300 MG/50ML
80.0000 mg/m2 | Freq: Once | INTRAVENOUS | Status: AC
  Administered 2017-06-20: 204 mg via INTRAVENOUS
  Filled 2017-06-20: qty 34

## 2017-06-20 MED ORDER — SODIUM CHLORIDE 0.9% FLUSH
10.0000 mL | INTRAVENOUS | Status: DC | PRN
  Administered 2017-06-20: 10 mL
  Filled 2017-06-20: qty 10

## 2017-06-20 MED ORDER — DIPHENHYDRAMINE HCL 50 MG/ML IJ SOLN
25.0000 mg | Freq: Once | INTRAMUSCULAR | Status: AC
  Administered 2017-06-20: 25 mg via INTRAVENOUS

## 2017-06-20 MED ORDER — FAMOTIDINE IN NACL 20-0.9 MG/50ML-% IV SOLN
INTRAVENOUS | Status: AC
  Filled 2017-06-20: qty 50

## 2017-06-20 MED ORDER — PEGFILGRASTIM 6 MG/0.6ML ~~LOC~~ PSKT
6.0000 mg | PREFILLED_SYRINGE | Freq: Once | SUBCUTANEOUS | Status: AC
  Administered 2017-06-20: 6 mg via SUBCUTANEOUS
  Filled 2017-06-20: qty 0.6

## 2017-06-20 MED ORDER — SODIUM CHLORIDE 0.9 % IV SOLN
Freq: Once | INTRAVENOUS | Status: AC
  Administered 2017-06-20: 11:00:00 via INTRAVENOUS

## 2017-06-20 MED ORDER — DEXAMETHASONE SODIUM PHOSPHATE 10 MG/ML IJ SOLN
10.0000 mg | Freq: Once | INTRAMUSCULAR | Status: AC
Start: 2017-06-20 — End: 2017-06-20
  Administered 2017-06-20: 10 mg via INTRAVENOUS

## 2017-06-20 MED ORDER — PALONOSETRON HCL INJECTION 0.25 MG/5ML
INTRAVENOUS | Status: AC
  Filled 2017-06-20: qty 5

## 2017-06-20 MED ORDER — PALONOSETRON HCL INJECTION 0.25 MG/5ML
0.2500 mg | Freq: Once | INTRAVENOUS | Status: AC
  Administered 2017-06-20: 0.25 mg via INTRAVENOUS

## 2017-06-20 MED ORDER — HEPARIN SOD (PORK) LOCK FLUSH 100 UNIT/ML IV SOLN
500.0000 [IU] | Freq: Once | INTRAVENOUS | Status: AC | PRN
  Administered 2017-06-20: 500 [IU]
  Filled 2017-06-20: qty 5

## 2017-06-20 MED ORDER — SODIUM CHLORIDE 0.9 % IV SOLN
150.0000 mg | Freq: Once | INTRAVENOUS | Status: AC
  Administered 2017-06-20: 150 mg via INTRAVENOUS
  Filled 2017-06-20: qty 15

## 2017-06-20 MED ORDER — DEXAMETHASONE SODIUM PHOSPHATE 10 MG/ML IJ SOLN
INTRAMUSCULAR | Status: AC
  Filled 2017-06-20: qty 1

## 2017-06-20 NOTE — Patient Instructions (Signed)
[facility] Discharge Instructions for Patients Receiving Chemotherapy  Today you received the following chemotherapy agents Taxol and Carboplatin   To help prevent nausea and vomiting after your treatment, we encourage you to take your nausea medication as directed. No Zofran for 3 days, take your Compazine instead.    If you develop nausea and vomiting that is not controlled by your nausea medication, call the clinic.   BELOW ARE SYMPTOMS THAT SHOULD BE REPORTED IMMEDIATELY:  *FEVER GREATER THAN 100.5 F  *CHILLS WITH OR WITHOUT FEVER  NAUSEA AND VOMITING THAT IS NOT CONTROLLED WITH YOUR NAUSEA MEDICATION  *UNUSUAL SHORTNESS OF BREATH  *UNUSUAL BRUISING OR BLEEDING  TENDERNESS IN MOUTH AND THROAT WITH OR WITHOUT PRESENCE OF ULCERS  *URINARY PROBLEMS  *BOWEL PROBLEMS  UNUSUAL RASH Items with * indicate a potential emergency and should be followed up as soon as possible.  Feel free to call the clinic you have any questions or concerns. The clinic phone number is [phone].  Please show the [facility] [place] at check-in to the Emergency Department and triage nurse.

## 2017-06-20 NOTE — Progress Notes (Signed)
[facility]  [phone] [phone]  Clinic Follow up Note   Patient Care Team: Tammi Sou, MD as PCP - General (Family Medicine) Harold Hedge, Darrick Grinder, MD as Consulting Physician (Allergy and Immunology) Shirley Muscat Loreen Freud, MD as Consulting Physician (Optometry) Sueanne Margarita, MD as Consulting Physician (Cardiology) Pyrtle, Lajuan Lines, MD as Consulting Physician (Gastroenterology) Latanya Maudlin, MD as Consulting Physician (Orthopedic Surgery) Jodi Marble, MD as Consulting Physician (Otolaryngology) Truitt Merle, MD as Consulting Physician (Hematology) Kyung Rudd, MD as Consulting Physician (Radiation Oncology) 06/20/2017     CHIEF COMPLAINTS:  Follow up GE junction cancer  Oncology History   Cancer of cardio-esophageal junction [facility])   Staging form: Stomach, AJCC 7th Edition     Clinical stage from 05/04/2016: Stage IV ([place], N2, M1) - Signed by Truitt Merle, MD on 05/18/2016        Cancer of cardio-esophageal junction ([place])   05/04/2016 Initial Diagnosis    Cancer of cardio-esophageal junction ([place])      05/04/2016 Procedure    EGD showed a large ulcerating mass with no active bleeding at the gastroesophageal junction extending into the gastric cardia. The mass was not obstructing and partially circumferential, extends approximately 5 cm. Nonbleeding erosive gastropathy.      05/04/2016 Initial Biopsy    Esophageal gastric junction biopsy showed a poorly differentiated carcinoma underlying the squamous mucosa. There is lymphovascular invasion. No Intestinal metaplasia. IHC weakly positive CK5/6, p63 (-), favor squamous.        05/09/2016 Imaging    CT CAP w contrast showed mild wall thickening involving the distal esophagus and proximal stomach compatible with known cancer, enlarged mediastinal and upper abdominal lymph nodes are highly suspicious for metastatic adenopathy, propable liver cirrhosis.      06/04/2016 - 06/22/2016 Radiation  Therapy    palliative radiation to esophageal cancer       07/06/2016 -  Chemotherapy    chemotherapy with weekly carboplatin and taxol, started on 07/06/2016, held 08/21/16-09/28/2016 due to hospitalization, changed to every 2 weeks from 11/30/2016      07/30/2016 Pathology Results    PD-L1 negative expression      08/28/2016 - 08/31/2016 Hospital Admission    The patient was admitted to 4 severe hypoglycemia, dehydration, and acute renal failure. Infection workup was negative. He recovered well with supportive care. His insulin and hypertension medication was held on discharge, except insulin sliding scale.      09/24/2016 Imaging    CT CAP W CONTRAST 09/26/2016 IMPRESSION: Decreased masslike soft tissue prominence at gastroesophageal junction, consistent with decreased size of primary gastroesophageal junction carcinoma. Resolution of paraesophageal and gastrohepatic ligament lymphadenopathy since prior exam. Other sub-cm mediastinal lymph nodes show little or no significant change. Stable indeterminate sub-cm low-attenuation lesion in the liver dome. Probable hepatic cirrhosis. Recommend continued attention on follow-up CT. No new or progressive metastatic disease identified. Cholelithiasis.  No radiographic evidence of cholecystitis. Colonic diverticulosis. No radiographic evidence of diverticulitis. Aortic atherosclerosis and three-vessel coronary artery calcification.      01/07/2017 Imaging    CT CAP w contrast 1. No residual esophageal mass or adenopathy. 2. New nodular airspace opacification in the left lower lobe. Continued attention on followup exams is warranted as metastatic disease cannot be definitively excluded. 3. Cirrhosis. 4. Trace left pleural effusion. 5. Aortic atherosclerosis (ICD10-170.0). Coronary artery calcification. 6. Cholelithiasis. 7. Mild basilar predominant subpleural reticular densities. Difficult to exclude interstitial lung disease.       04/29/2017 Imaging  CT CAP w contrast  IMPRESSION: 1. No evidence of metastatic disease. 2. Nodular airspace disease previously seen in the left lower lobe has resolved in the interval. 3. Tiny right pleural effusion, new. Small left pleural effusion, slightly increased. 4. Cirrhosis with splenomegaly. 5.  Aortic atherosclerosis (ICD10-170.0).       HISTORY OF PRESENTING ILLNESS:  [name] 71 y.o. male is here because of His reason that diagnosed GE junction carcinoma. He is a comment of by his wife and daughter to our multidisciplinary chart clinic today.  He has been having progressive dysphagia and odynophagia for 2 month, he has been eating soft diet only in the past one week. He has pain in the mid chest and epigastric area only when he eats, with burning sensation, no nausea, abdominal bloating, or other discomfort. His appetite has remained well, no weight loss,   He has multiple medical problems, especially heart failure, he has been on oxygen continuously for 5-6 years. He is morbidly obese, has diabetes, hypertension, mild neuropathy, OSA etc. He has very sedentary life style, he sits in the chair and watch TV most of time during the day. He only comes out for shopping for a few times a week. He uses a [employer], he can walk for a few hundrends feet before he has to stop to catch his breasts. He denies cough or sputum production, no GI discomfort, he has been having mild dirrhea, loose stool, twice daily, no melena or hematochezia.  CURRENT THERAPY: chemotherapy with weekly carboplatin and taxol, started on 07/06/2016, held 08/21/16-09/28/2016 due to hospitalization, changed to every 2 weeks from 11/30/2016; Due to moderate some cytopenia, I'll reduce carboplatin AUC to 1.0, continue Taxol 70 mg/m, continue chemotherapy every 2 weeks (03/06/17). Add Onpro patch starting 04/08/17   INTERIM HISTORY:  Mr. Capuano returns for follow-up.  He presents to the infusion room today  with his wife.He has been doing well and denies nay bruising or bleeding, fever or pain. He has some arthritis, which is mild and stable, no other new complains.    MEDICAL HISTORY:  Past Medical History:  Diagnosis Date  . Asthma    as a child  . Cataract    multiple types, bilateral  . Cholelithiasis without cholecystitis   . Chronic diastolic CHF (congestive heart failure) ([place]) 02/25/2009  . Chronic renal insufficiency, stage III (moderate) 2017   Stage II/III (GFR around 60)  . 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 COPD (chronic obstructive pulmonary disease) ([place])   . Coronary artery disease    chronically occluded LAD and diagonal with right to left collaterals, mild disease in the left circ and moderate disease in the mid RCA on medical management with Imdur, ASA, and Plavix.  . Dermatitis 05/2014  . DIABETES MELLITUS, TYPE II 06/24/2007   No diab retpthy as of 08/05/15 eye exam.  . Esophageal cancer ([place]) 04/2016   poorly differentiated carcinoma (Dr. Pyrtle--EGD).  CT C/A/P showed metastatic adenopathy in mediastinum and upper abdomen 05/09/16.  Tx plan is palliative radiation (completed 06/22/16), then palliative systemic chemotherapy (carbo+taxol) was started but as of 08/03/16 onc f/u this was held due to severe knee and ankle arthralgias.  Restarting as of 10/2015 (08/2016 CT showed some disease regression  . Esophageal cancer ([place])    CT 12/2016 showed no residual esoph mass--plan to  continue chemo.  Ongoing palliative chemo with carboplatin and taxol q 2 wks as of 03/2017 onc f/u (CT 12/2016 and 04/2017 showed no progression of dz).  Taking a 2 mo break from chemo as of 05/2017.  . Essential hypertension 05/01/2007   Qualifier: Diagnosis of  By: Tiney Rouge CMA, Ellison Hughs     . History of kidney stones   . Hyperkalemia 12/2015   Decreased ACE-I by 50% in response, then  potassium normalized.  Marland Kitchen HYPERLIPIDEMIA 06/24/2007  . Hypoxemia 01/27/2010  . Myocardial infarction [facility])    pt states he was informed per MD that he has had one but pt was unaware   . OBESITY 09/02/2008  . Obesity hypoventilation syndrome (HCC)    oxygen 24/7 (2 liters [place] as of 02/2016)  . On home oxygen therapy    Oxygen @ 2l/m nasally 24/7 hours  . OSA (obstructive sleep apnea)    not tested; pt scored 4 per stop bang tool results sent to PCP   . OSTEOARTHRITIS 05/01/2007  . Pancytopenia due to chemotherapy ([place]) 2018  . Presbycusis of both ears 05/2015   [facility]  . Pruritic condition 05/2014   Allergist summer 2015, no new testing.  [name] field defect 05/10/16   Per Dr. Melina Fiddler, O.D.: Rt, loss of inf/temp quad and some loss sup/temp.  ?CVA  ? Pituitary tumor? ?Brain met.    SURGICAL HISTORY: Past Surgical History:  Procedure Laterality Date  . APPENDECTOMY    . BALLOON DILATION N/A 05/04/2016   Procedure: BALLOON DILATION;  Surgeon: Jerene Bears, MD;  Location: [facility];  Service: Gastroenterology;  Laterality: N/A;  . CARDIAC CATHETERIZATION    . CARPAL TUNNEL RELEASE Left   . CATARACT EXTRACTION W/PHACO Right 04/29/2013   Procedure: CATARACT EXTRACTION PHACO AND INTRAOCULAR LENS PLACEMENT (IOC);  Surgeon: Adonis Brook, MD;  Location: [facility];  Service: Ophthalmology;  Laterality: Right;  . CATARACT EXTRACTION, BILATERAL    . COLONOSCOPY W/ POLYPECTOMY  08/2011   Many polyps--all hyperplastic, severe diverticulosis, int hem.  [employer] hemoccult testing via [facility] 06/22/15 NEG  . ESOPHAGOGASTRODUODENOSCOPY N/A 05/04/2016   Procedure: ESOPHAGOGASTRODUODENOSCOPY (EGD);  Surgeon: Jerene Bears, MD;  Location: [facility];  Service: Gastroenterology;  Laterality: N/A;  . IR GENERIC HISTORICAL  10/03/2016   IR US GUIDE VASC ACCESS RIGHT 10/03/2016 Greggory Keen, MD [facility]  . IR GENERIC HISTORICAL  10/03/2016   IR FLUORO GUIDE PORT INSERTION RIGHT 10/03/2016 Greggory Keen, MD [facility]  . KNEE ARTHROSCOPY WITH MEDIAL MENISECTOMY Right 12/16/2014   Procedure: RIGHT KNEE ARTHROSCOPY WITH MEDIAL MENISECTOMY microfracture medial femoral condyle abrasion condroplasty medial femoral condyle lateral menisectomy;  Surgeon: Tobi Bastos, MD;  Location: [facility];  Service: Orthopedics;  Laterality: Right;  . LEFT HEART CATHETERIZATION WITH CORONARY ANGIOGRAM N/A 10/26/2013   Procedure: LEFT HEART CATHETERIZATION WITH CORONARY ANGIOGRAM;  Surgeon: Jettie Booze, MD;  Location: [facility] CATH LAB;  Service: Cardiovascular;  Laterality: N/A;  . LUMBAR [place] SURGERY  2001  . SHOULDER SURGERY Right    right fx  . TONSILLECTOMY    . TRANSTHORACIC ECHOCARDIOGRAM  03/2016   EF 55-60%, grade I DD, LAE  . WRIST SURGERY Right    right fx    SOCIAL HISTORY: Social History   Social History  . Marital status: Married    Spouse name: susan  . Number of children: 2  . Years of education: N/A   Occupational History  . Retired    Social History  Main Topics  . Smoking status: Former Smoker    Packs/day: 1.50    Years: 30.00    Types: Cigarettes, Pipe, Cigars    Quit date: 10/30/1983  . Smokeless tobacco: Former Systems developer    Types: Chew    Quit date: 05/15/2016  . Alcohol use No  . Drug use: No  . Sexual activity: Not Currently   Other Topics Concern  . Not on file   Social History Narrative   Married, one son and one daughter.   His daughter and her two children live with him.   Coffee daily.  Former smoker.  No alcohol.   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   Attends church weekly-   Oxygen continuous       FAMILY HISTORY: Family History  Problem Relation Age of Onset  . Heart attack Mother   . Aneurysm Father   . Alcohol abuse Father   . Diabetes Maternal Aunt   . Colon cancer Neg Hx     ALLERGIES:  has No Known Allergies.  MEDICATIONS:  Current Outpatient Prescriptions  Medication Sig Dispense Refill  . aspirin 81 MG tablet Take  81 mg by mouth daily.      Marland Kitchen atorvastatin (LIPITOR) 80 MG tablet TAKE ONE TABLET BY MOUTH ONCE DAILY 90 tablet 2  . beta carotene w/minerals (OCUVITE) tablet Take 1 tablet by mouth daily.    . cetirizine (ZYRTEC) 10 MG tablet Take 10 mg by mouth daily.    . citalopram (CELEXA) 20 MG tablet TAKE ONE TABLET BY MOUTH ONCE DAILY 90 tablet 1  . clonazePAM (KLONOPIN) 1 MG tablet TAKE ONE TABLET BY MOUTH TWICE DAILY AS NEEDED FOR ANXIETY 60 tablet 5  . clotrimazole-betamethasone (LOTRISONE) cream Apply to affected area twice daily 45 g 2  . cromolyn (OPTICROM) 4 % ophthalmic solution Place 1 drop into both eyes 4 (four) times daily.    Marland Kitchen ezetimibe (ZETIA) 10 MG tablet Take 1 tablet (10 mg total) by mouth daily. 90 tablet 3  . gabapentin (NEURONTIN) 300 MG capsule TAKE ONE CAPSULE BY MOUTH THREE TIMES DAILY 270 capsule 3  . hydrOXYzine (ATARAX/VISTARIL) 25 MG tablet TAKE 1 TABLET BY MOUTH AT SUPPER AND 1 TAB AT BEDTIME FOR INSOMNIA 60 tablet 6  . Insulin Lispro (HUMALOG KWIKPEN) 200 UNIT/ML SOPN Inject 20 Units into the skin 2 (two) times daily with a meal. (Patient taking differently: Inject 16 Units into the skin 2 (two) times daily with a meal. Takes  16 units  Twice  Daily.) 18 pen 6  . Insulin NPH, Human,, Isophane, (HUMULIN N) 100 UNIT/ML Kiwkpen 16 units every morning and 16 units every evening 15 mL 3  . magnesium chloride (SLOW-MAG) 64 MG TBEC SR tablet Take 1 tablet (64 mg total) by mouth daily. 60 tablet 0  . metFORMIN (GLUCOPHAGE) 1000 MG tablet TAKE ONE TABLET BY MOUTH TWICE DAILY WITH MEALS 180 tablet 1  . Omega-3 Fatty Acids ([employer] FISH OIL) 1000 MG CAPS Take 2 tablets by mouth 2 (two) times daily. 90 capsule   . ondansetron (ZOFRAN) 8 MG tablet Take 1 tablet (8 mg total) by mouth 2 (two) times daily as needed for refractory nausea / vomiting. Start on day 3 after chemo. 30 tablet 1  . ONE TOUCH ULTRA TEST test strip USE  STRIP TO CHECK GLUCOSE THREE TIMES DAILY AS  DIRECTED 100 each 11  .  ONETOUCH DELICA LANCETS 16X MISC USE ONE  TO  CHECK GLUCOSE THREE TIMES DAILY 100 each 11  . oxyCODONE (OXY IR/ROXICODONE) 5 MG immediate release tablet Take 1 tablet (5 mg total) by mouth every 8 (eight) hours as needed for severe pain. 30 tablet 0  . OXYGEN Inhale 2 L/min into the lungs continuous.    . pantoprazole (PROTONIX) 40 MG tablet TAKE 1 TABLET BY MOUTH TWICE DAILY 60 tablet 2  . prochlorperazine (COMPAZINE) 10 MG tablet Take 1 tablet (10 mg total) by mouth every 6 (six) hours as needed (Nausea or vomiting). 30 tablet 1   No current facility-administered medications for this visit.    Facility-Administered Medications Ordered in Other Visits  Medication Dose Route Frequency Provider Last Rate Last Dose  . sodium chloride flush (NS) 0.9 % injection 10 mL  10 mL Intravenous PRN Truitt Merle, MD   10 mL at 04/16/17 1459  . sodium chloride flush (NS) 0.9 % injection 10 mL  10 mL Intravenous PRN Truitt Merle, MD   10 mL at 05/02/17 1725    REVIEW OF SYSTEMS:  Constitutional: Denies fevers, abnormal night sweats  Eyes: Denies blurriness of vision, double vision or watery eyes Ears, nose, mouth, throat, and face: Denies mucositis or sore throat, Respiratory: Denies cough, dyspnea or wheezes Cardiovascular: Denies palpitation, chest discomfort or lower extremity swelling Gastrointestinal:  Denies nausea, heartburn or change in bowel habits  Skin: Denies abnormal skin rashes  Lymphatics: Denies new lymphadenopathy or easy bruising Neurological:Denies numbness, tingling   Behavioral/Psych: Mood is stable, no new changes  MSK: (+)arthritis  All other systems were reviewed with the patient and are negative.  PHYSICAL EXAMINATION:  ECOG PERFORMANCE STATUS: 3 - Symptomatic, >50% confined to bed Blood pressure 116/68, heart rate 92, respiratory rate 20, temperature 36.6, pulse ox 98 percent on room air GENERAL:alert, no distress and comfortable, morbid obese, sitting in wheelchair with nasal  cannula oxygen  SKIN: skin color, texture, turgor are normal, no rashes or significant lesions EYES: normal, conjunctiva are pink and non-injected, sclera clear OROPHARYNX:no exudate, no erythema and lips, buccal mucosa, and tongue normal  NECK: supple, thyroid normal size, non-tender, without nodularity LYMPH:  no palpable lymphadenopathy in the cervical, axillary or inguinal LUNGS: clear, HEART: regular rate & rhythm and no murmurs and no lower extremity edema ABDOMEN:abdomen soft, non-tender and normal bowel sounds Musculoskeletal:no cyanosis of digits and no clubbing  PSYCH: alert & oriented x 3 with fluent speech NEURO: no focal motor/sensory deficits EXT: Trace edema, he wears compression stocks, no significant ankle swollen, erythema, or warmness.   LABORATORY DATA:  I have reviewed the data as listed CBC Latest Ref Rng & Units 06/20/2017 06/06/2017 05/15/2017  WBC 4.0 - 10.3 10e3/uL 8.3 6.1 9.3  Hemoglobin 13.0 - 17.1 g/dL 10.2(L) 10.7(L) 9.6(L)  Hematocrit 38.4 - 49.9 % 33.9(L) 35.6(L) 31.8(L)  Platelets 140 - 400 10e3/uL 93(L) 97(L) 70(L)   CMP Latest Ref Rng & Units 06/20/2017 06/06/2017 05/15/2017  Glucose 70 - 140 mg/dl 137 149(H) 177(H)  BUN 7.0 - 26.0 mg/dL 11.4 14.4 9.1  Creatinine 0.7 - 1.3 mg/dL 0.8 0.8 0.8  Sodium 136 - 145 mEq/L 140 140 140  Potassium 3.5 - 5.1 mEq/L 4.0 4.4 3.8  Chloride 96 - 106 mmol/L - - -  CO2 22 - 29 mEq/L 28 30(H) 26  Calcium 8.4 - 10.4 mg/dL 9.0 9.2 8.7  Total Protein 6.4 - 8.3 g/dL 6.4 6.4 6.0(L)  Total Bilirubin 0.20 - 1.20 mg/dL 0.66 0.70 0.62  Alkaline Phos 40 - 150 U/L  103 78 115  AST 5 - 34 U/L 47(H) 45(H) 63(H)  ALT 0 - 55 U/L 57(H) 41 61(H)   PATHOLOGY REPORT Diagnosis 05/04/2016 1. Esophagogastric junction, biopsy, ulcerative mass - POORLY DIFFERENTIATED CARCINOMA, SEE COMMENT. 2. Stomach, biopsy - REACTIVE GASTROPATHY. - NEGATIVE FOR HELICOBACTER PYLORI. - NO INTESTINAL METAPLASIA, DYSPLASIA, OR MALIGNANCY. Microscopic  Comment 1. The majority of the specimen consists of gastroesophageal mucosa with reflux changes. There is a small focus of tumor underlying the squamous mucosa. There is lymphovascular invasion. There is no background intestinal metaplasia (Barrett's esophagus). Immunohistochemistry reveals only very focal weak cytokeratin 5/6, negative p63, and negative mucicarmine in the limited remaining tumor. Dr. Saralyn Pilar has reviewed the case. The case was called to Dr. Hilarie Fredrickson on 05/08/2016. 2. A Warthin-Starry stain is performed to determine the possibility of the presence of Helicobacter pylori. The Warthin-Starry stain is negative for organisms of Helicobacter pylori.     RADIOGRAPHIC STUDIES: I have personally reviewed the radiological images as listed and agreed with the findings in the report.  CT CAP w contrast 04/29/2017 IMPRESSION: 1. No evidence of metastatic disease. 2. Nodular airspace disease previously seen in the left lower lobe has resolved in the interval. 3. Tiny right pleural effusion, new. Small left pleural effusion, slightly increased. 4. Cirrhosis with splenomegaly. 5.  Aortic atherosclerosis (ICD10-170.0).   CT CAP w contrast 01/07/2017 IMPRESSION: 1. No residual esophageal mass or adenopathy. 2. New nodular airspace opacification in the left lower lobe. Continued attention on followup exams is warranted as metastatic disease cannot be definitively excluded. 3. Cirrhosis. 4. Trace left pleural effusion. 5. Aortic atherosclerosis (ICD10-170.0). Coronary artery calcification. 6. Cholelithiasis. 7. Mild basilar predominant subpleural reticular densities. Difficult to exclude interstitial lung disease.  EGD 05/04/2016 A large, ulcerating mass with no active bleeding and no stigmata of recent bleeding was found at the gastroesophageal junction extending into the gastric cardia, beginning 40 cm from the incisors. The mass was non-obstructing and partially circumferential  (involving one-half of the lumen circumference). The mass extends approximately 5 cm. IMPRESSION:  -Likely malignant esophageal tumor was found at the gastroesophageal junction extending into the gastric cardia. Biopsied. - Non-bleeding erosive gastropathy. Biopsied. - Erythematous duodenopathy. - Normal second portion of the duodenum.  ASSESSMENT & PLAN: 71 y.o. Caucasian male, with multiple comorbidities, including hypertension, diabetes, OSA, coronary artery disease, diastolic CHF, on continuous oxygen, morbid obesity, presented with progressive dysphagia and odynophagia.  1. Cancer of cardio-esophageal junction, poorly differentiated, cTxN2M1 with node metastasis, stage IV  -I previously reviewed his CT scan, EGD, and the biopsy results with patient and his family members in great details. -His biopsy pathology was previously reviewed in our tumor board, the morphology and weakly positive for CK 5/6, fevers squamous cell carcinoma -His CT scan findings are very concerning for distant metastasis to abdominal lymph nodes.  -I previously reviewed the PET scan images with patient and his daughter in person, he has hypermetabolic GE junction tumor, and diffuse adenopathy in mediastinum and upper abdomen. -We previously discussed the aggressive nature of esophageal cancer, an incurable nature of his disease due to the distant metastasis. The goal of therapy is palliative, to prolong his life and palliate his symptoms. -His tumor was negative for PD-L1, not a candidate for immunotherapy  -He is on first line palliative chemotherapy with weekly carbo and Taxol, every 2 weeks, tolerating low dose better lately. -I previously reviewed his CT scan findings on 09/26/2016 show good partial response to chemoradiation, no new or progressive metastatic  disease. - I previously  reviewed his restaging CT scan from 01/07/2017,  Which showed no residual esophageal mass or adenopathy, he has mild new infiltrative  change in LLL, possible related to aspiration, he is asymptomatic, will continue monitoring  -He is clinically doing well, tolerating chemotherapy well, lab results reviewed with patient, mild pancytopenia, secondary to chemotherapy, adequate for treatment, we'll continue low-dose carboplatin and Taxol every 2 weeks. Due to the thrombocytopenia, I have decreased his carboplatin to AUC 1.5 previously. -I reviewed his restaging CT scan from 04/29/2017 which showed no evidence of primary or metastatic cancer. -He has been on chemotherapy since early September 2017, lost a few scan showed no evidence of disease, we discussed the option of holding chemotherapy for 2 months, and repeat scan, versus continued chemotherapy. After a lengthy discussion, patient decided to stop chemotherapy after the next treatment for observation. We discussed the possibility of tumor progression after chemotherapy break. Since he has had excellent response (completed radiographic response), I think the reasonable to give him a chemotherapy break, and observation him as long as no cancer progression. - today is patient's last cycle of chemotherapy. We will start observation only after today's treatment  -Repeat scan within 2 months F/u in 4 weeks -We reviewed the signs of cancer recurrence. If he notices any pain or new issues he knows to call us    2. CAD, diastolic CHF, on continuous oxygen -He will continue follow-up with his cardiologist Dr. Radford Pax -His Plavix has been held since the EGD and biopsy, he is on baby aspirin. -Need to watch his fluid intake during his chemotherapy, and also avoid fluid overload from chemo  -he is clinically stable  3. DM, HTN -We previously discussed his blood pressure and glucose need to be monitored closely during the chemotherapy, which may affect his blood glucose and blood pressure -He will continue medication, monitor his sugar and blood pressure at home, and follow-up with his primary  care physician. -will decrease premed dexa before taxol  -History simple has been held due to his borderline low blood pressure -He will see his primary care physician Dr. Anitra Lauth for diabetes management.  4. OSA, morbid obesity -We previously discussed healthy diet, I encouraged him to to be more physically active, and consider home PT -previously I strongly encouraged him to follow-up with dietitian during the therapy for nutrition support.  5. Arthralgia -He has baseline osteoarthritis, not physically active -However his arthralgia in his knees and ankle are much worse after chemotherapy but resolved now  -He will use oxycodone 5 mg as needed, constipation management previously reviewed with him. -Leg weakness improved with physical therapy. Previously encouraged the patient to continue with physical therapy.   6. Pancytopenia  -Second is to underlying malignancy and chemotherapy -Mild anemia is stable, his hemoglobin is 10.0 previously, thrombocytopenia worse, platelet 88K previously, we reduced chemotherapy dose -we'll consider blood transfusion if hemoglobin less than 8, for symptomatic anemia.  7. Goal of care discussion, DNR/DNI -We previously discussed the incurable nature of his cancer, and the overall poor prognosis, especially if he does not have good response to chemotherapy or progress on chemo -The patient understands the goal of care is palliative. -Patient is very realistic, understands his cancer is not curable, and he agrees with DO NOT RESUSCITATE and DO NOT INTUBATE   PLAN -Lab reviewed, mild some cytopenia, adequate for treatment, we'll proceed carboplatin and Taxol today. We'll plan to hold chemotherapy after today's treatment and start observation - -Repeat  scan in about 2 months -F/u in 4 weeks   All questions were answered. The patient knows to call the clinic with any problems, questions or concerns.  I spent 20 minutes counseling the patient face to  face. The total time spent in the appointment was 25 minutes and more than 35% was on counseling.  This document serves as a record of services personally performed by Truitt Merle, MD. It was created on her behalf by Brandt Loosen, a trained medical scribe. The creation of this record is based on the scribe's personal observations and the provider's statements to them. This document has been checked and approved by the attending provider.

## 2017-06-20 NOTE — Progress Notes (Signed)
Ok to treat with platelets at 93 per MD Burr Medico.

## 2017-06-21 ENCOUNTER — Encounter: Payer: Self-pay | Admitting: Hematology

## 2017-07-01 ENCOUNTER — Other Ambulatory Visit: Payer: Self-pay | Admitting: Family Medicine

## 2017-07-02 ENCOUNTER — Encounter: Payer: Self-pay | Admitting: Family Medicine

## 2017-07-02 NOTE — Telephone Encounter (Signed)
[facility] [address] 

## 2017-07-04 ENCOUNTER — Other Ambulatory Visit: Payer: Self-pay | Admitting: Family Medicine

## 2017-07-08 DIAGNOSIS — J449 Chronic obstructive pulmonary disease, unspecified: Secondary | ICD-10-CM | POA: Diagnosis not present

## 2017-07-16 NOTE — Progress Notes (Signed)
[place]  [phone] [phone]  Clinic Follow up Note   Patient Care Team: [name] Sou, MD as PCP - General (Family Medicine) [name], Darrick Grinder, MD as Consulting Physician (Allergy and Immunology) [name] [name] Freud, MD as Consulting Physician (Optometry) [name] Margarita, MD as Consulting Physician (Cardiology) [name], [name] Lines, MD as Consulting Physician (Gastroenterology) [name] Maudlin, MD as Consulting Physician (Orthopedic Surgery) [name] Marble, MD as Consulting Physician (Otolaryngology) [name] Merle, MD as Consulting Physician (Hematology) [name] Rudd, MD as Consulting Physician (Radiation Oncology) 07/18/2017     CHIEF COMPLAINTS:  Follow up GE junction cancer  Oncology History   Cancer of cardio-esophageal junction [name])   Staging form: Stomach, AJCC 7th Edition     Clinical stage from 05/04/2016: Stage IV ([place], N2, M1) - Signed by [name] Merle, MD on 05/18/2016        Cancer of cardio-esophageal junction ([name])   05/04/2016 Initial Diagnosis    Cancer of cardio-esophageal junction ([name])      05/04/2016 Procedure    EGD showed a large ulcerating mass with no active bleeding at the gastroesophageal junction extending into the gastric cardia. The mass was not obstructing and partially circumferential, extends approximately 5 cm. Nonbleeding erosive gastropathy.      05/04/2016 Initial Biopsy    Esophageal gastric junction biopsy showed a poorly differentiated carcinoma underlying the squamous mucosa. There is lymphovascular invasion. No Intestinal metaplasia. IHC weakly positive CK5/6, p63 (-), favor squamous.        05/09/2016 Imaging    CT CAP w contrast showed mild wall thickening involving the distal esophagus and proximal stomach compatible with known cancer, enlarged mediastinal and upper abdominal lymph nodes are highly suspicious for metastatic adenopathy, propable liver cirrhosis.      06/04/2016 - 06/22/2016 Radiation  Therapy    palliative radiation to esophageal cancer       07/06/2016 - 06/20/2017 Chemotherapy    chemotherapy with weekly carboplatin and taxol, started on 07/06/2016, held 08/21/16-09/28/2016 due to hospitalization, changed to every 2 weeks from 11/30/2016. Chemo stopped due to no evidence of disease on restaging scan.      07/30/2016 Pathology Results    PD-L1 negative expression      08/28/2016 - 08/31/2016 [name] Admission    The patient was admitted to 4 severe hypoglycemia, dehydration, and acute renal failure. Infection workup was negative. He recovered well with supportive care. His insulin and hypertension medication was held on discharge, except insulin sliding scale.      09/24/2016 Imaging    CT CAP W CONTRAST 09/26/2016 IMPRESSION: Decreased masslike soft tissue prominence at gastroesophageal junction, consistent with decreased size of primary gastroesophageal junction carcinoma. Resolution of paraesophageal and gastrohepatic ligament lymphadenopathy since prior exam. Other sub-cm mediastinal lymph nodes show little or no significant change. Stable indeterminate sub-cm low-attenuation lesion in the liver dome. Probable hepatic cirrhosis. Recommend continued attention on follow-up CT. No new or progressive metastatic disease identified. Cholelithiasis.  No radiographic evidence of cholecystitis. Colonic diverticulosis. No radiographic evidence of diverticulitis. Aortic atherosclerosis and three-vessel coronary artery calcification.      01/07/2017 Imaging    CT CAP w contrast 1. No residual esophageal mass or adenopathy. 2. New nodular airspace opacification in the left lower lobe. Continued attention on followup exams is warranted as metastatic disease cannot be definitively excluded. 3. Cirrhosis. 4. Trace left pleural effusion. 5. Aortic atherosclerosis (ICD10-170.0). Coronary artery calcification. 6. Cholelithiasis. 7. Mild basilar predominant subpleural reticular  densities. Difficult to exclude  interstitial lung disease.      04/29/2017 Imaging    CT CAP w contrast  IMPRESSION: 1. No evidence of metastatic disease. 2. Nodular airspace disease previously seen in the left lower lobe has resolved in the interval. 3. Tiny right pleural effusion, new. Small left pleural effusion, slightly increased. 4. Cirrhosis with splenomegaly. 5.  Aortic atherosclerosis (ICD10-170.0).       HISTORY OF PRESENTING ILLNESS:  [name] 71 y.o. male is here because of His reason that diagnosed GE junction carcinoma. He is a comment of by his wife and daughter to our multidisciplinary chart clinic today.  He has been having progressive dysphagia and odynophagia for 2 month, he has been eating soft diet only in the past one week. He has pain in the mid chest and epigastric area only when he eats, with burning sensation, no nausea, abdominal bloating, or other discomfort. His appetite has remained well, no weight loss,   He has multiple medical problems, especially heart failure, he has been on oxygen continuously for 5-6 years. He is morbidly obese, has diabetes, hypertension, mild neuropathy, OSA etc. He has very sedentary life style, he sits in the chair and watch TV most of time during the day. He only comes out for shopping for a few times a week. He uses a Barrister's clerk, he can walk for a few hundrends feet before he has to stop to catch his breasts. He denies cough or sputum production, no GI discomfort, he has been having mild dirrhea, loose stool, twice daily, no melena or hematochezia.  CURRENT THERAPY: Observation   INTERIM HISTORY:  Mr. [name] returns for follow-up. He has been doing well. Since his last visit he denies any problems including swelling. He denies any abdominal pain/discomfort. He has no corners.     MEDICAL HISTORY:  Past Medical History:  Diagnosis Date  . Asthma    as a child  . Cataract    multiple types, bilateral  .  Cholelithiasis without cholecystitis   . Chronic diastolic CHF (congestive heart failure) ([name]) 02/25/2009  . Chronic renal insufficiency, stage III (moderate) 2017   Stage II/III (GFR around 60)  . 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 COPD (chronic obstructive pulmonary disease) ([place])   . Coronary artery disease    chronically occluded LAD and diagonal with right to left collaterals, mild disease in the left circ and moderate disease in the mid RCA on medical management with Imdur, ASA, and Plavix.  . Dermatitis 05/2014  . DIABETES MELLITUS, TYPE II 06/24/2007   No diab retpthy as of 08/05/15 eye exam.  . Esophageal cancer ([place]) 04/2016   poorly differentiated carcinoma (Dr. Pyrtle--EGD).  CT C/A/P showed metastatic adenopathy in mediastinum and upper abdomen 05/09/16.  Tx plan is palliative radiation (completed 06/22/16), then palliative systemic chemotherapy (carbo+taxol) was started but as of 08/03/16 onc f/u this was held due to severe knee and ankle arthralgias.  Restarting as of 10/2015 (08/2016 CT showed some disease regression  . Esophageal cancer ([place])    CT 12/2016 showed no residual esoph mass--plan to continue chemo.  Ongoing palliative chemo with carboplatin and taxol q 2 wks as of 03/2017 onc f/u (CT 12/2016 and 04/2017 showed no progression of dz).  Taking a 2 mo break from chemo as of 05/2017.  . Essential hypertension 05/01/2007  Qualifier: Diagnosis of  By: Tiney Rouge CMA, Ellison Hughs     . History of kidney stones   . Hyperkalemia 12/2015   Decreased ACE-I by 50% in response, then potassium normalized.  Marland Kitchen HYPERLIPIDEMIA 06/24/2007  . Hypoxemia 01/27/2010  . Myocardial infarction [facility])    pt states he was informed per MD that he has had one but pt was unaware   . OBESITY 09/02/2008  . Obesity hypoventilation syndrome (HCC)    oxygen 24/7 (2 liters [place] as of 02/2016)  . On  home oxygen therapy    Oxygen @ 2l/m nasally 24/7 hours  . OSA (obstructive sleep apnea)    not tested; pt scored 4 per stop bang tool results sent to PCP   . OSTEOARTHRITIS 05/01/2007  . Pancytopenia due to chemotherapy ([place]) 2018  . Presbycusis of both ears 05/2015   [facility]  . Pruritic condition 05/2014   Allergist summer 2015, no new testing.  [name] field defect 05/10/16   Per Dr. Melina Fiddler, O.D.: Rt, loss of inf/temp quad and some loss sup/temp.  ?CVA  ? Pituitary tumor? ?Brain met.    SURGICAL HISTORY: Past Surgical History:  Procedure Laterality Date  . APPENDECTOMY    . BALLOON DILATION N/A 05/04/2016   Procedure: BALLOON DILATION;  Surgeon: Jerene Bears, MD;  Location: [facility];  Service: Gastroenterology;  Laterality: N/A;  . CARDIAC CATHETERIZATION    . CARPAL TUNNEL RELEASE Left   . CATARACT EXTRACTION W/PHACO Right 04/29/2013   Procedure: CATARACT EXTRACTION PHACO AND INTRAOCULAR LENS PLACEMENT (IOC);  Surgeon: Adonis Brook, MD;  Location: [facility];  Service: Ophthalmology;  Laterality: Right;  . CATARACT EXTRACTION, BILATERAL    . COLONOSCOPY W/ POLYPECTOMY  08/2011   Many polyps--all hyperplastic, severe diverticulosis, int hem.  [employer] hemoccult testing via [facility] 06/22/15 NEG  . ESOPHAGOGASTRODUODENOSCOPY N/A 05/04/2016   Procedure: ESOPHAGOGASTRODUODENOSCOPY (EGD);  Surgeon: Jerene Bears, MD;  Location: [facility];  Service: Gastroenterology;  Laterality: N/A;  . IR GENERIC HISTORICAL  10/03/2016   IR US GUIDE VASC ACCESS RIGHT 10/03/2016 Greggory Keen, MD [facility]  . IR GENERIC HISTORICAL  10/03/2016   IR FLUORO GUIDE PORT INSERTION RIGHT 10/03/2016 Greggory Keen, MD [facility]  . KNEE ARTHROSCOPY WITH MEDIAL MENISECTOMY Right 12/16/2014   Procedure: RIGHT KNEE ARTHROSCOPY WITH MEDIAL MENISECTOMY microfracture medial femoral condyle abrasion condroplasty medial femoral condyle lateral menisectomy;  Surgeon: Tobi Bastos, MD;  Location: [facility];  Service:  Orthopedics;  Laterality: Right;  . LEFT HEART CATHETERIZATION WITH CORONARY ANGIOGRAM N/A 10/26/2013   Procedure: LEFT HEART CATHETERIZATION WITH CORONARY ANGIOGRAM;  Surgeon: Jettie Booze, MD;  Location: [facility] CATH LAB;  Service: Cardiovascular;  Laterality: N/A;  . LUMBAR [place] SURGERY  2001  . SHOULDER SURGERY Right    right fx  . TONSILLECTOMY    . TRANSTHORACIC ECHOCARDIOGRAM  03/2016   EF 55-60%, grade I DD, LAE  . WRIST SURGERY Right    right fx    SOCIAL HISTORY: Social History   Social History  . Marital status: Married    Spouse name: susan  . Number of children: 2  . Years of education: N/A   Occupational History  . Retired    Social History Main Topics  . Smoking status: Former Smoker    Packs/day: 1.50    Years: 30.00    Types: Cigarettes, Pipe, Cigars    Quit date: 10/30/1983  . Smokeless tobacco: Former Systems developer    Types: Loss adjuster, chartered  Quit date: 05/15/2016  . Alcohol use No  . Drug use: No  . Sexual activity: Not Currently   Other Topics Concern  . Not on file   Social History Narrative   Married, one son and one daughter.   His daughter and her two children live with him.   Coffee daily.  Former smoker.  No alcohol.   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   Attends church weekly-   Oxygen continuous       FAMILY HISTORY: Family History  Problem Relation Age of Onset  . Heart attack Mother   . Aneurysm Father   . Alcohol abuse Father   . Diabetes Maternal Aunt   . Colon cancer Neg Hx     ALLERGIES:  has No Known Allergies.  MEDICATIONS:  Current Outpatient Prescriptions  Medication Sig Dispense Refill  . aspirin 81 MG tablet Take 81 mg by mouth daily.      Marland Kitchen atorvastatin (LIPITOR) 80 MG tablet TAKE ONE TABLET BY MOUTH ONCE DAILY 90 tablet 2  . beta carotene w/minerals (OCUVITE) tablet Take 1 tablet by mouth daily.    . cetirizine (ZYRTEC) 10 MG tablet Take 10 mg by mouth daily.    . citalopram (CELEXA) 20 MG  tablet TAKE 1 TABLET BY MOUTH ONCE DAILY 90 tablet 1  . clonazePAM (KLONOPIN) 1 MG tablet TAKE ONE TABLET BY MOUTH TWICE DAILY AS NEEDED FOR ANXIETY 60 tablet 5  . clotrimazole-betamethasone (LOTRISONE) cream Apply to affected area twice daily 45 g 2  . cromolyn (OPTICROM) 4 % ophthalmic solution Place 1 drop into both eyes 4 (four) times daily.    Marland Kitchen gabapentin (NEURONTIN) 300 MG capsule TAKE ONE CAPSULE BY MOUTH THREE TIMES DAILY 270 capsule 3  . hydrOXYzine (ATARAX/VISTARIL) 25 MG tablet TAKE 1 TABLET BY MOUTH AT SUPPER AND 1 TAB AT BEDTIME FOR INSOMNIA 60 tablet 6  . Insulin Lispro (HUMALOG KWIKPEN) 200 UNIT/ML SOPN Inject 20 Units into the skin 2 (two) times daily with a meal. (Patient taking differently: Inject 16 Units into the skin 2 (two) times daily with a meal. Takes  16 units  Twice  Daily.) 18 pen 6  . Insulin NPH, Human,, Isophane, (HUMULIN N) 100 UNIT/ML Kiwkpen 16 units every morning and 16 units every evening 15 mL 3  . magnesium chloride (SLOW-MAG) 64 MG TBEC SR tablet Take 1 tablet (64 mg total) by mouth daily. 60 tablet 0  . metFORMIN (GLUCOPHAGE) 1000 MG tablet TAKE 1 TABLET BY MOUTH TWICE DAILY WITH MEALS 180 tablet 1  . Omega-3 Fatty Acids ([employer] FISH OIL) 1000 MG CAPS Take 2 tablets by mouth 2 (two) times daily. 90 capsule   . ondansetron (ZOFRAN) 8 MG tablet Take 1 tablet (8 mg total) by mouth 2 (two) times daily as needed for refractory nausea / vomiting. Start on day 3 after chemo. 30 tablet 1  . ONE TOUCH ULTRA TEST test strip USE  STRIP TO CHECK GLUCOSE THREE TIMES DAILY AS  DIRECTED 100 each 11  . ONETOUCH DELICA LANCETS 16X MISC USE ONE  TO CHECK GLUCOSE THREE TIMES DAILY 100 each 11  . oxyCODONE (OXY IR/ROXICODONE) 5 MG immediate release tablet Take 1 tablet (5 mg total) by mouth every 8 (eight) hours as needed for severe pain. 30 tablet 0  . OXYGEN Inhale 2 L/min into the lungs continuous.    . pantoprazole (PROTONIX) 40 MG tablet TAKE 1 TABLET BY MOUTH TWICE DAILY 60  tablet 2  . prochlorperazine (COMPAZINE) 10 MG tablet Take 1 tablet (10 mg total) by mouth every 6 (six) hours as needed (Nausea or vomiting). 30 tablet 1  . ezetimibe (ZETIA) 10 MG tablet Take 1 tablet (10 mg total) by mouth daily. 90 tablet 3   No current facility-administered medications for this visit.    Facility-Administered Medications Ordered in Other Visits  Medication Dose Route Frequency Provider Last Rate Last Dose  . sodium chloride flush (NS) 0.9 % injection 10 mL  10 mL Intravenous PRN [name] Merle, MD   10 mL at 04/16/17 1459  . sodium chloride flush (NS) 0.9 % injection 10 mL  10 mL Intravenous PRN [name] Merle, MD   10 mL at 05/02/17 1725    REVIEW OF SYSTEMS:  Constitutional: Denies fevers, abnormal night sweats  Eyes: Denies blurriness of vision, double vision or watery eyes Ears, nose, mouth, throat, and face: Denies mucositis or sore throat, Respiratory: Denies cough, dyspnea or wheezes Cardiovascular: Denies palpitation, chest discomfort or lower extremity swelling Gastrointestinal:  Denies nausea, heartburn or change in bowel habits  Skin: Denies abnormal skin rashes  Lymphatics: Denies new lymphadenopathy or easy bruising Neurological:Denies numbness, tingling   Behavioral/Psych: Mood is stable, no new changes  MSK: (+)arthritis  All other systems were reviewed with the patient and are negative.  PHYSICAL EXAMINATION:  ECOG PERFORMANCE STATUS: 3 - Symptomatic, >50% confined to bed BP 140/63 (BP Location: Right Arm, Patient Position: Sitting)   Pulse (!) 118   Temp 98.5 F (36.9 C)   Resp 20   Ht '5\' 8"'  (1.727 m)   Wt 277 lb 1.6 oz (125.7 kg)   SpO2 96%   BMI 42.13 kg/m   GENERAL:alert, no distress and comfortable, morbid obese, sitting in wheelchair with nasal cannula oxygen  SKIN: skin color, texture, turgor are normal, no rashes or significant lesions EYES: normal, conjunctiva are pink and non-injected, sclera clear OROPHARYNX:no exudate, no erythema and  lips, buccal mucosa, and tongue normal  NECK: supple, thyroid normal size, non-tender, without nodularity LYMPH:  no palpable lymphadenopathy in the cervical, axillary or inguinal LUNGS: clear, HEART: regular rate & rhythm and no murmurs and no lower extremity edema ABDOMEN:abdomen soft, non-tender and normal bowel sounds Musculoskeletal:no cyanosis of digits and no clubbing  PSYCH: alert & oriented x 3 with fluent speech NEURO: no focal motor/sensory deficits EXT: Trace edema, he wears compression stocks, no significant ankle swollen, erythema, or warmness.   LABORATORY DATA:  I have reviewed the data as listed CBC Latest Ref Rng & Units 07/18/2017 06/20/2017 06/06/2017  WBC 4.0 - 10.3 10e3/uL 7.7 8.3 6.1  Hemoglobin 13.0 - 17.1 g/dL 11.2(L) 10.2(L) 10.7(L)  Hematocrit 38.4 - 49.9 % 35.0(L) 33.9(L) 35.6(L)  Platelets 140 - 400 10e3/uL 129(L) 93(L) 97(L)   CMP Latest Ref Rng & Units 07/18/2017 06/20/2017 06/06/2017  Glucose 70 - 140 mg/dl 180(H) 137 149(H)  BUN 7.0 - 26.0 mg/dL 13.8 11.4 14.4  Creatinine 0.7 - 1.3 mg/dL 0.9 0.8 0.8  Sodium 136 - 145 mEq/L 140 140 140  Potassium 3.5 - 5.1 mEq/L 3.7 4.0 4.4  Chloride 96 - 106 mmol/L - - -  CO2 22 - 29 mEq/L 27 28 30(H)  Calcium 8.4 - 10.4 mg/dL 9.0 9.0 9.2  Total Protein 6.4 - 8.3 g/dL 6.7 6.4 6.4  Total Bilirubin 0.20 - 1.20 mg/dL 0.79 0.66 0.70  Alkaline Phos 40 - 150 U/L 89 103 78  AST 5 - 34 U/L 44(H) 47(H) 45(H)  ALT 0 - 55 U/L 38 57(H) 41   PATHOLOGY REPORT Diagnosis 05/04/2016 1. Esophagogastric junction, biopsy, ulcerative mass - POORLY DIFFERENTIATED CARCINOMA, SEE COMMENT. 2. Stomach, biopsy - REACTIVE GASTROPATHY. - NEGATIVE FOR HELICOBACTER PYLORI. - NO INTESTINAL METAPLASIA, DYSPLASIA, OR MALIGNANCY. Microscopic Comment 1. The majority of the specimen consists of gastroesophageal mucosa with reflux changes. There is a small focus of tumor underlying the squamous mucosa. There is lymphovascular invasion. There is no  background intestinal metaplasia (Barrett's esophagus). Immunohistochemistry reveals only very focal weak cytokeratin 5/6, negative p63, and negative mucicarmine in the limited remaining tumor. Dr. Saralyn Pilar has reviewed the case. The case was called to Dr. Hilarie Fredrickson on 05/08/2016. 2. A Warthin-Starry stain is performed to determine the possibility of the presence of Helicobacter pylori. The Warthin-Starry stain is negative for organisms of Helicobacter pylori.     RADIOGRAPHIC STUDIES: I have personally reviewed the radiological images as listed and agreed with the findings in the report.  CT CAP w contrast 04/29/2017 IMPRESSION: 1. No evidence of metastatic disease. 2. Nodular airspace disease previously seen in the left lower lobe has resolved in the interval. 3. Tiny right pleural effusion, new. Small left pleural effusion, slightly increased. 4. Cirrhosis with splenomegaly. 5.  Aortic atherosclerosis (ICD10-170.0).   CT CAP w contrast 01/07/2017 IMPRESSION: 1. No residual esophageal mass or adenopathy. 2. New nodular airspace opacification in the left lower lobe. Continued attention on followup exams is warranted as metastatic disease cannot be definitively excluded. 3. Cirrhosis. 4. Trace left pleural effusion. 5. Aortic atherosclerosis (ICD10-170.0). Coronary artery calcification. 6. Cholelithiasis. 7. Mild basilar predominant subpleural reticular densities. Difficult to exclude interstitial lung disease.  EGD 05/04/2016 A large, ulcerating mass with no active bleeding and no stigmata of recent bleeding was found at the gastroesophageal junction extending into the gastric cardia, beginning 40 cm from the incisors. The mass was non-obstructing and partially circumferential (involving one-half of the lumen circumference). The mass extends approximately 5 cm. IMPRESSION:  -Likely malignant esophageal tumor was found at the gastroesophageal junction extending into the gastric  cardia. Biopsied. - Non-bleeding erosive gastropathy. Biopsied. - Erythematous duodenopathy. - Normal second portion of the duodenum.  ASSESSMENT & PLAN: 71 y.o. Caucasian male, with multiple comorbidities, including hypertension, diabetes, OSA, coronary artery disease, diastolic CHF, on continuous oxygen, morbid obesity, presented with progressive dysphagia and odynophagia.  1. Cancer of cardio-esophageal junction, poorly differentiated, cTxN2M1 with node metastasis, stage IV  -I previously reviewed his CT scan, EGD, and the biopsy results with patient and his family members in great details. -His biopsy pathology was previously reviewed in our tumor board, the morphology and weakly positive for CK 5/6, fevers squamous cell carcinoma -His CT scan findings are very concerning for distant metastasis to abdominal lymph nodes.  -I previously reviewed the PET scan images with patient and his daughter in person, he has hypermetabolic GE junction tumor, and diffuse adenopathy in mediastinum and upper abdomen. -We previously discussed the aggressive nature of esophageal cancer, an incurable nature of his disease due to the distant metastasis. The goal of therapy is palliative, to prolong his life and palliate his symptoms. -His tumor was negative for PD-L1, not a candidate for immunotherapy  -He is on first line palliative chemotherapy with weekly carbo and Taxol, every 2 weeks, tolerating low dose better lately. -I previously reviewed his CT scan findings on 09/26/2016 show good partial response to chemoradiation, no new or progressive metastatic disease. - I previously  reviewed his restaging CT scan from 01/07/2017,  Which showed no residual esophageal mass or adenopathy, he has mild new infiltrative change in LLL, possible related to aspiration, he is asymptomatic, will continue monitoring  -He is clinically doing well, tolerating chemotherapy well, lab results reviewed with patient, mild pancytopenia,  secondary to chemotherapy, adequate for treatment, we'll continue low-dose carboplatin and Taxol every 2 weeks. Due to the thrombocytopenia, I have decreased his carboplatin to AUC 1.5 previously. -I reviewed his restaging CT scan from 04/29/2017 which showed no evidence of primary or metastatic cancer. -He has been on chemotherapy since early September 2017, last few scans showed no evidence of disease, we discussed the option of holding chemotherapy for 2 months, and repeat scan, versus continued chemotherapy. After a lengthy discussion, patient decided to stop chemotherapy after the next treatment for observation. We discussed the possibility of tumor progression after chemotherapy break. Since he has had excellent response (completed radiographic response), I think the reasonable to give him a chemotherapy break, and observation him as long as no cancer progression. - Patient has completed chemotherapy. He is now on observation -We reviewed the signs of cancer recurrence. If he notices any pain or new issues he knows to call us  - f/u in 2 months with CT CAP with contrast the week before  2. CAD, diastolic CHF, on continuous oxygen -He will continue follow-up with his cardiologist Dr. Radford Pax -His Plavix has been held since the EGD and biopsy, he is on baby aspirin. -Need to watch his fluid intake during his chemotherapy, and also avoid fluid overload from chemo  -he is clinically stable  3. DM, HTN -We previously discussed his blood pressure and glucose need to be monitored closely during the chemotherapy, which may affect his blood glucose and blood pressure -He will continue medication, monitor his sugar and blood pressure at home, and follow-up with his primary care physician. -will decrease premed dexa before taxol  -History simple has been held due to his borderline low blood pressure -He will see his primary care physician Dr. Anitra Lauth for diabetes management.  4. OSA, morbid obesity -We  previously discussed healthy diet, I encouraged him to to be more physically active, and consider home PT -previously I strongly encouraged him to follow-up with dietitian during the therapy for nutrition support.  5. Arthralgia -He has baseline osteoarthritis, not physically active -However his arthralgia in his knees and ankle are much worse after chemotherapy but resolved now  -He will use oxycodone 5 mg as needed, constipation management previously reviewed with him. -Leg weakness improved with physical therapy. Previously encouraged the patient to continue with physical therapy.   6. Pancytopenia  -Second is to underlying malignancy and chemotherapy -Improved since he stopped the chemotherapy.  7. Goal of care discussion, DNR/DNI -We previously discussed the incurable nature of his cancer, and the overall poor prognosis, especially if he does not have good response to chemotherapy or progress on chemo -The patient understands the goal of care is palliative. -Patient is very realistic, understands his cancer is not curable, and he agrees with DO NOT RESUSCITATE and DO NOT INTUBATE   PLAN - Labs reviewed, patient is clinically doing well. - f/u and flush in 2 months with CT CAP with contrast the week before - flu shot with PCP within the next weeks   All questions were answered. The patient knows to call the clinic with any problems, questions or concerns.  I spent 20 minutes counseling the patient face to face. The total time spent in the appointment  was 25 minutes and more than 35% was on counseling.  This document serves as a record of services personally performed by [name] Merle, MD. It was created on her behalf by Brandt Loosen, a trained medical scribe. The creation of this record is based on the scribe's personal observations and the provider's statements to them. This document has been checked and approved by the attending provider.  [name]  07/18/2017

## 2017-07-18 ENCOUNTER — Encounter: Payer: Self-pay | Admitting: Hematology

## 2017-07-18 ENCOUNTER — Other Ambulatory Visit (HOSPITAL_BASED_OUTPATIENT_CLINIC_OR_DEPARTMENT_OTHER): Payer: Medicare Other

## 2017-07-18 ENCOUNTER — Ambulatory Visit (HOSPITAL_BASED_OUTPATIENT_CLINIC_OR_DEPARTMENT_OTHER): Payer: Medicare Other

## 2017-07-18 ENCOUNTER — Telehealth: Payer: Self-pay | Admitting: Hematology

## 2017-07-18 ENCOUNTER — Ambulatory Visit (HOSPITAL_BASED_OUTPATIENT_CLINIC_OR_DEPARTMENT_OTHER): Payer: Medicare Other | Admitting: Hematology

## 2017-07-18 VITALS — BP 140/63 | HR 118 | Temp 98.5°F | Resp 20 | Ht 68.0 in | Wt 277.1 lb

## 2017-07-18 DIAGNOSIS — I10 Essential (primary) hypertension: Secondary | ICD-10-CM

## 2017-07-18 DIAGNOSIS — E119 Type 2 diabetes mellitus without complications: Secondary | ICD-10-CM | POA: Diagnosis not present

## 2017-07-18 DIAGNOSIS — C160 Malignant neoplasm of cardia: Secondary | ICD-10-CM

## 2017-07-18 DIAGNOSIS — I5032 Chronic diastolic (congestive) heart failure: Secondary | ICD-10-CM

## 2017-07-18 DIAGNOSIS — Z95828 Presence of other vascular implants and grafts: Secondary | ICD-10-CM

## 2017-07-18 DIAGNOSIS — D61810 Antineoplastic chemotherapy induced pancytopenia: Secondary | ICD-10-CM

## 2017-07-18 DIAGNOSIS — Z452 Encounter for adjustment and management of vascular access device: Secondary | ICD-10-CM | POA: Diagnosis not present

## 2017-07-18 LAB — CBC WITH DIFFERENTIAL/PLATELET
BASO%: 0.6 % (ref 0.0–2.0)
Basophils Absolute: 0 10*3/uL (ref 0.0–0.1)
EOS%: 2.3 % (ref 0.0–7.0)
Eosinophils Absolute: 0.2 10*3/uL (ref 0.0–0.5)
HEMATOCRIT: 35 % — AB (ref 38.4–49.9)
HEMOGLOBIN: 11.2 g/dL — AB (ref 13.0–17.1)
LYMPH#: 0.9 10*3/uL (ref 0.9–3.3)
LYMPH%: 11.6 % — ABNORMAL LOW (ref 14.0–49.0)
MCH: 30.1 pg (ref 27.2–33.4)
MCHC: 32.2 g/dL (ref 32.0–36.0)
MCV: 93.5 fL (ref 79.3–98.0)
MONO#: 0.8 10*3/uL (ref 0.1–0.9)
MONO%: 10 % (ref 0.0–14.0)
NEUT#: 5.8 10*3/uL (ref 1.5–6.5)
NEUT%: 75.5 % — ABNORMAL HIGH (ref 39.0–75.0)
Platelets: 129 10*3/uL — ABNORMAL LOW (ref 140–400)
RBC: 3.74 10*6/uL — ABNORMAL LOW (ref 4.20–5.82)
RDW: 18.2 % — AB (ref 11.0–14.6)
WBC: 7.7 10*3/uL (ref 4.0–10.3)

## 2017-07-18 LAB — COMPREHENSIVE METABOLIC PANEL
ALBUMIN: 3.2 g/dL — AB (ref 3.5–5.0)
ALK PHOS: 89 U/L (ref 40–150)
ALT: 38 U/L (ref 0–55)
AST: 44 U/L — AB (ref 5–34)
Anion Gap: 10 mEq/L (ref 3–11)
BUN: 13.8 mg/dL (ref 7.0–26.0)
CALCIUM: 9 mg/dL (ref 8.4–10.4)
CHLORIDE: 103 meq/L (ref 98–109)
CO2: 27 mEq/L (ref 22–29)
CREATININE: 0.9 mg/dL (ref 0.7–1.3)
EGFR: 86 mL/min/{1.73_m2} — ABNORMAL LOW (ref >90)
Glucose: 180 mg/dl — ABNORMAL HIGH (ref 70–140)
Potassium: 3.7 mEq/L (ref 3.5–5.1)
SODIUM: 140 meq/L (ref 136–145)
Total Bilirubin: 0.79 mg/dL (ref 0.20–1.20)
Total Protein: 6.7 g/dL (ref 6.4–8.3)

## 2017-07-18 MED ORDER — SODIUM CHLORIDE 0.9% FLUSH
10.0000 mL | INTRAVENOUS | Status: DC | PRN
  Administered 2017-07-18: 10 mL via INTRAVENOUS
  Filled 2017-07-18: qty 10

## 2017-07-18 MED ORDER — HEPARIN SOD (PORK) LOCK FLUSH 100 UNIT/ML IV SOLN
500.0000 [IU] | Freq: Once | INTRAVENOUS | Status: AC | PRN
  Administered 2017-07-18: 500 [IU] via INTRAVENOUS
  Filled 2017-07-18: qty 5

## 2017-07-18 NOTE — Telephone Encounter (Signed)
Gave patient avs report and appointments for November. Central radiology will call re scan.  °

## 2017-07-25 NOTE — Progress Notes (Signed)
Subjective:   [name] is a 71 y.o. male who presents for Medicare Annual/Subsequent preventive examination.  Review of Systems:  No ROS.  Medicare Wellness Visit. Additional risk factors are reflected in the social history.  Cardiac Risk Factors include: advanced age (>31mn, >>42women);diabetes mellitus;dyslipidemia;hypertension;male gender;obesity (BMI >30kg/m2);sedentary lifestyle   Sleep patterns: Sleeps 7-8 hours.   Home Safety/Smoke Alarms: Feels safe in home. Smoke alarms in place.  Living environment; residence and Firearm Safety: Lives with family in 1 story home with ramp at door.  Seat Belt Safety/Bike Helmet: Wears seat belt.    Male:   CCS-Colonoscopy 09/25/2011, polyps. Recall 5 years. Pt reports last 2016  PSA- No results found for: PSA      Objective:    Vitals: BP 130/70 (BP Location: Left Arm, Patient Position: Sitting, Cuff Size: Normal)   Pulse (!) 104   Temp 98.4 F (36.9 C) (Oral)   Resp 18   Ht '5\' 8"'  (1.727 m)   Wt 283 lb 12.8 oz (128.7 kg)   SpO2 98%   BMI 43.15 kg/m   Body mass index is 43.15 kg/m.  Tobacco History  Smoking Status  . Former Smoker  . Packs/day: 1.50  . Years: 30.00  . Types: Cigarettes, Pipe, Cigars  . Quit date: 10/30/1983  Smokeless Tobacco  . Former [employer] developer . Types: Chew  . Quit date: 05/15/2016     Counseling given: Not Answered   Past Medical History:  Diagnosis Date  . Asthma    as a child  . Cataract    multiple types, bilateral  . Cholelithiasis without cholecystitis   . Chronic diastolic CH[place] (congestive heart failure) [place] 02/25[place]  . Chronic renal insufficiency, stage III (moderate) 2017   Stage II/III (GFR around 60)  . 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 COPD (chronic obstructive pulmonary disease) [place]   . Coronary artery disease    chronically occluded LAD and  diagonal with right to left collaterals, mild disease in the left circ and moderate disease in the mid RCA on medical management with Imdur, ASA, and Plavix.  . Dermatitis 05/2014  . DIABETES MELLITUS, TYPE II 06/24/2007   No diab retpthy as of 08/05/15 eye exam.  . 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 Pyrtle--EGD).  CT C/A/P showed metastatic adenopathy in mediastinum and upper abdomen 05/09/16.  Tx plan is palliative radiation (completed 06/22/16), then palliative systemic chemotherapy (carbo+taxol) was started but as of 08/03/16 onc f/u this was held due to severe knee and ankle arthralgias.  Restarting as of 10/2015 (08/2016 CT showed some disease regression  . Esophageal cancer ([place]    CT 12/2016 showed no residual esoph mass--plan to continue chemo.  Ongoing palliative chemo with carboplatin and taxol q 2 wks as of 03/2017 onc f/u (CT 12/2016 and 04/2017 showed no progression of dz).  Taking a 2 mo break from chemo as of 05/2017.  . Essential hypertension 05/01/2007   Qualifier: Diagnosis of  By: ATiney RougeCMA, LEllison Hughs    . History of kidney stones   . Hyperkalemia 12/2015   Decreased ACE-I by 50% in response, then potassium normalized.  .Marland KitchenHYPERLIPIDEMIA 06/24/2007  . Hypoxemia 01/27/2010  . Myocardial infarction ([facility]    pt states he was informed per MD that he has had one but pt was unaware   .  OBESITY 09/02/2008  . Obesity hypoventilation syndrome (HCC)    oxygen 24/7 (2 liters [place] as of 02/2016)  . On home oxygen therapy    Oxygen @ 2l/m nasally 24/7 hours  . OSA (obstructive sleep apnea)    not tested; pt scored 4 per stop bang tool results sent to PCP   . OSTEOARTHRITIS 05/01/2007  . Pancytopenia due to chemotherapy ([place]) 2018  . Presbycusis of both ears 05/2015   [facility]  . Pruritic condition 05/2014   Allergist summer 2015, no new testing.  [name] field defect 05/10/16   Per Dr. Melina Fiddler, O.D.: Rt, loss of inf/temp quad and some loss sup/temp.  ?CVA  ?  Pituitary tumor? ?Brain met.   Past Surgical History:  Procedure Laterality Date  . APPENDECTOMY    . BALLOON DILATION N/A 05/04/2016   Procedure: BALLOON DILATION;  Surgeon: Jerene Bears, MD;  Location: [facility];  Service: Gastroenterology;  Laterality: N/A;  . CARDIAC CATHETERIZATION    . CARPAL TUNNEL RELEASE Left   . CATARACT EXTRACTION W/PHACO Right 04/29/2013   Procedure: CATARACT EXTRACTION PHACO AND INTRAOCULAR LENS PLACEMENT (IOC);  Surgeon: Adonis Brook, MD;  Location: [facility];  Service: Ophthalmology;  Laterality: Right;  . CATARACT EXTRACTION, BILATERAL    . COLONOSCOPY W/ POLYPECTOMY  08/2011   Many polyps--all hyperplastic, severe diverticulosis, int hem.  [employer] hemoccult testing via [facility] 06/22/15 NEG  . ESOPHAGOGASTRODUODENOSCOPY N/A 05/04/2016   Procedure: ESOPHAGOGASTRODUODENOSCOPY (EGD);  Surgeon: Jerene Bears, MD;  Location: [facility];  Service: Gastroenterology;  Laterality: N/A;  . IR GENERIC HISTORICAL  10/03/2016   IR US GUIDE VASC ACCESS RIGHT 10/03/2016 Greggory Keen, MD [facility]  . IR GENERIC HISTORICAL  10/03/2016   IR FLUORO GUIDE PORT INSERTION RIGHT 10/03/2016 Greggory Keen, MD [facility]  . KNEE ARTHROSCOPY WITH MEDIAL MENISECTOMY Right 12/16/2014   Procedure: RIGHT KNEE ARTHROSCOPY WITH MEDIAL MENISECTOMY microfracture medial femoral condyle abrasion condroplasty medial femoral condyle lateral menisectomy;  Surgeon: Tobi Bastos, MD;  Location: [facility];  Service: Orthopedics;  Laterality: Right;  . LEFT HEART CATHETERIZATION WITH CORONARY ANGIOGRAM N/A 10/26/2013   Procedure: LEFT HEART CATHETERIZATION WITH CORONARY ANGIOGRAM;  Surgeon: Jettie Booze, MD;  Location: [facility] CATH LAB;  Service: Cardiovascular;  Laterality: N/A;  . LUMBAR [place] SURGERY  2001  . SHOULDER SURGERY Right    right fx  . TONSILLECTOMY    . TRANSTHORACIC ECHOCARDIOGRAM  03/2016   EF 55-60%, grade I DD, LAE  . WRIST SURGERY Right    right fx   Family History  Problem  Relation Age of Onset  . Heart attack Mother   . Aneurysm Father   . Alcohol abuse Father   . Diabetes Maternal Aunt   . Colon cancer Neg Hx    History  Sexual Activity  . Sexual activity: Not Currently    Outpatient Encounter Prescriptions as of 07/26/2017  Medication Sig  . aspirin 81 MG tablet Take 81 mg by mouth daily.    Marland Kitchen atorvastatin (LIPITOR) 80 MG tablet TAKE ONE TABLET BY MOUTH ONCE DAILY  . beta carotene w/minerals (OCUVITE) tablet Take 1 tablet by mouth daily.  . cetirizine (ZYRTEC) 10 MG tablet Take 10 mg by mouth daily.  . citalopram (CELEXA) 20 MG tablet TAKE 1 TABLET BY MOUTH ONCE DAILY  . clonazePAM (KLONOPIN) 1 MG tablet TAKE ONE TABLET BY MOUTH TWICE DAILY AS NEEDED FOR ANXIETY  . clotrimazole-betamethasone (LOTRISONE) cream Apply to affected area twice daily  .  ezetimibe (ZETIA) 10 MG tablet Take 1 tablet (10 mg total) by mouth daily.  Marland Kitchen gabapentin (NEURONTIN) 300 MG capsule TAKE ONE CAPSULE BY MOUTH THREE TIMES DAILY  . hydrOXYzine (ATARAX/VISTARIL) 25 MG tablet TAKE 1 TABLET BY MOUTH AT SUPPER AND 1 TAB AT BEDTIME FOR INSOMNIA  . ibuprofen (ADVIL,MOTRIN) 200 MG tablet Take 400 mg by mouth every 6 (six) hours as needed.  . Insulin Lispro (HUMALOG KWIKPEN) 200 UNIT/ML SOPN Inject 20 Units into the skin 2 (two) times daily with a meal. (Patient taking differently: Inject 16 Units into the skin 2 (two) times daily with a meal. Takes  16 units  Twice  Daily.)  . Insulin NPH, Human,, Isophane, (HUMULIN N) 100 UNIT/ML Kiwkpen 16 units every morning and 16 units every evening  . magnesium chloride (SLOW-MAG) 64 MG TBEC SR tablet Take 1 tablet (64 mg total) by mouth daily.  . metFORMIN (GLUCOPHAGE) 1000 MG tablet TAKE 1 TABLET BY MOUTH TWICE DAILY WITH MEALS  . Omega-3 Fatty Acids ([employer] FISH OIL) 1000 MG CAPS Take 2 tablets by mouth 2 (two) times daily.  . ondansetron (ZOFRAN) 8 MG tablet Take 1 tablet (8 mg total) by mouth 2 (two) times daily as needed for refractory nausea  / vomiting. Start on day 3 after chemo.  . ONE TOUCH ULTRA TEST test strip USE  STRIP TO CHECK GLUCOSE THREE TIMES DAILY AS  DIRECTED  . ONETOUCH DELICA LANCETS 79K MISC USE ONE  TO CHECK GLUCOSE THREE TIMES DAILY  . oxyCODONE (OXY IR/ROXICODONE) 5 MG immediate release tablet Take 1 tablet (5 mg total) by mouth every 8 (eight) hours as needed for severe pain.  . OXYGEN Inhale 2 L/min into the lungs continuous.  . pantoprazole (PROTONIX) 40 MG tablet TAKE 1 TABLET BY MOUTH TWICE DAILY  . prochlorperazine (COMPAZINE) 10 MG tablet Take 1 tablet (10 mg total) by mouth every 6 (six) hours as needed (Nausea or vomiting).  . cromolyn (OPTICROM) 4 % ophthalmic solution Place 1 drop into both eyes 4 (four) times daily.   Facility-Administered Encounter Medications as of 07/26/2017  Medication  . sodium chloride flush (NS) 0.9 % injection 10 mL  . sodium chloride flush (NS) 0.9 % injection 10 mL    Activities of Daily Living In your present state of health, do you have any difficulty performing the following activities: 07/26/2017  Hearing? Y  Vision? N  Difficulty concentrating or making decisions? N  Walking or climbing stairs? Y  Comment Walker or cane with walking; cannot climb steps  Dressing or bathing? N  Doing errands, shopping? Y  Preparing Food and eating ? Y  Using the Toilet? N  In the past six months, have you accidently leaked urine? N  Do you have problems with loss of bowel control? N  Managing your Medications? N  Managing your Finances? N  Housekeeping or managing your Housekeeping? Y  Some recent data might be hidden    Patient Care Team: Tammi Sou, MD as PCP - General (Family Medicine) Harold Hedge, Darrick Grinder, MD as Consulting Physician (Allergy and Immunology) Shirley Muscat Loreen Freud, MD as Consulting Physician (Optometry) Sueanne Margarita, MD as Consulting Physician (Cardiology) Pyrtle, Lajuan Lines, MD as Consulting Physician (Gastroenterology) Latanya Maudlin, MD as  Consulting Physician (Orthopedic Surgery) Jodi Marble, MD as Consulting Physician (Otolaryngology) Truitt Merle, MD as Consulting Physician (Hematology) Kyung Rudd, MD as Consulting Physician (Radiation Oncology)   Assessment:    Physical assessment deferred to PCP.  Exercise Activities  and Dietary recommendations Current Exercise Habits: The patient does not participate in regular exercise at present, Exercise limited by: cardiac condition(s);orthopedic condition(s);respiratory conditions(s);neurologic condition(s)   Diet (meal preparation, eat out, water intake, caffeinated beverages, dairy products, fruits and vegetables): Coffee, tea, soda and water.   Breakfast: toast/jelly; cereal Lunch: sandwich Dinner: protein and vegetables.    Goals      Patient Stated   . patient (pt-stated)          "to be here"      Other   . exercise 5 x weekly    . Weight < 200 lb (90.719 kg)      Fall Risk Fall Risk  07/26/2017 01/22/2017 07/10/2016 04/11/2015 09/30/2014  Falls in the past year? Yes Yes No Yes Yes  Number falls in past yr: 2 or more 2 or more - 2 or more 2 or more  Injury with Fall? No No - Yes -  Risk Factor Category  - High Fall Risk - High Fall Risk -  Risk for fall due to : Impaired balance/gait History of fall(s);Impaired balance/gait - - History of fall(s);Impaired balance/gait;Impaired mobility  Follow up Falls prevention discussed Falls evaluation completed;Falls prevention discussed - Falls prevention discussed -  Comment - - - pt had meniscal injury repaired and is feeling much better regarding balance/gait/stability: no falls since knee surgery -   Depression Screen PHQ 2/9 Scores 07/26/2017 04/25/2017 07/10/2016 04/11/2015  PHQ - 2 Score 0 0 1 0  PHQ- 9 Score - 3 - -    Cognitive Function       Ad8 score reviewed for issues:  Issues making decisions: no  Less interest in hobbies / activities: no  Repeats questions, stories (family complaining): no  Trouble  using ordinary gadgets (microwave, computer, phone): no  Forgets the month or year: no  Mismanaging finances: no  Remembering appts: no  Daily problems with thinking and/or memory: no Ad8 score is=0     Immunization History  Administered Date(s) Administered  . Influenza Split 10-24-202013  . Influenza Whole 07/29/2006, 08/12/2007, 07/27/2009, 08/30/2010, 07/06/2011  . Influenza, High Dose Seasonal PF 07/08/2015, 07/10/2016, 07/26/2017  . Influenza,inj,Quad PF,6+ Mos 07/23/2013, 08/26/2014  . Pneumococcal Conjugate-13 09/30/2014  . Pneumococcal Polysaccharide-23 10-24-202012  . Td 07/30/2003  . Tdap 09/30/2014   Screening Tests Health Maintenance  Topic Date Due  . COLONOSCOPY  09/24/2016  . OPHTHALMOLOGY EXAM  05/10/2017  . INFLUENZA VACCINE  05/29/2017  . HEMOGLOBIN A1C  10/25/2017  . FOOT EXAM  01/23/2018  . URINE MICROALBUMIN  01/23/2018  . TETANUS/TDAP  09/30/2024  . Hepatitis C Screening  Completed  . PNA vac Low Risk Adult  Completed      Plan:    Schedule eye exam.   Continue doing brain stimulating activities (puzzles, reading, adult coloring books, staying active) to keep memory sharp.    I have personally reviewed and noted the following in the patient's chart:   . Medical and social history . Use of alcohol, tobacco or illicit drugs  . Current medications and supplements . Functional ability and status . Nutritional status . Physical activity . Advanced directives . List of other physicians . Hospitalizations, surgeries, and ER visits in previous 12 months . Vitals . Screenings to include cognitive, depression, and falls . Referrals and appointments  In addition, I have reviewed and discussed with patient certain preventive protocols, quality metrics, and best practice recommendations. A written personalized care plan for preventive services as well as general preventive  health recommendations were provided to patient.     Gerilyn Nestle,  RN  07/26/2017

## 2017-07-26 ENCOUNTER — Ambulatory Visit (INDEPENDENT_AMBULATORY_CARE_PROVIDER_SITE_OTHER): Payer: Medicare Other | Admitting: Family Medicine

## 2017-07-26 ENCOUNTER — Encounter: Payer: Self-pay | Admitting: Family Medicine

## 2017-07-26 ENCOUNTER — Ambulatory Visit: Payer: Medicare Other

## 2017-07-26 VITALS — BP 130/70 | HR 104 | Temp 98.4°F | Resp 18 | Ht 68.0 in | Wt 283.8 lb

## 2017-07-26 DIAGNOSIS — E118 Type 2 diabetes mellitus with unspecified complications: Secondary | ICD-10-CM

## 2017-07-26 DIAGNOSIS — J9611 Chronic respiratory failure with hypoxia: Secondary | ICD-10-CM | POA: Diagnosis not present

## 2017-07-26 DIAGNOSIS — J449 Chronic obstructive pulmonary disease, unspecified: Secondary | ICD-10-CM

## 2017-07-26 DIAGNOSIS — Z23 Encounter for immunization: Secondary | ICD-10-CM

## 2017-07-26 DIAGNOSIS — M62838 Other muscle spasm: Secondary | ICD-10-CM

## 2017-07-26 DIAGNOSIS — Z0000 Encounter for general adult medical examination without abnormal findings: Secondary | ICD-10-CM | POA: Diagnosis not present

## 2017-07-26 DIAGNOSIS — I10 Essential (primary) hypertension: Secondary | ICD-10-CM | POA: Diagnosis not present

## 2017-07-26 DIAGNOSIS — Z794 Long term (current) use of insulin: Secondary | ICD-10-CM | POA: Diagnosis not present

## 2017-07-26 DIAGNOSIS — E662 Morbid (severe) obesity with alveolar hypoventilation: Secondary | ICD-10-CM

## 2017-07-26 LAB — POCT GLYCOSYLATED HEMOGLOBIN (HGB A1C): HEMOGLOBIN A1C: 6.1

## 2017-07-26 MED ORDER — BACLOFEN 10 MG PO TABS: ORAL_TABLET | ORAL | 1 refills | Status: DC

## 2017-07-26 NOTE — Progress Notes (Signed)
OFFICE VISIT  07/26/2017   CC:  Chief Complaint  Patient presents with  . Medicare Wellness   HPI:    Patient is a 71 y.o. Caucasian male who presents for 6 mo f/u DM 2, HTN, and obesity hypoventilation syndrome + COPD requiring chronic oxygen supplement. Feels muscle spasm in L upper hip region muscle spasms since waking up this morning, lateral region.  Has hx of these "years ago" and took baclofen prn and this helped.  Hurts when he tries to get from sitting into standing position and also on/off while walking.  Calms down when sitting still.  No recent trauma, strain, or overuse.  No radiation of pain or paresthesias.  No focal weakness.  Breathing: feel fine/stable. Glucoses: 16 U NPH bid. Humalog 16 U BF and Supper.  Says he "hardly ever eats lunch.  Review of log today shows 180s avg.  Low is 140s.  High 270s or so.  Checking only fasting glucoses.  No home bp monitoring: he has been off all of his bp med since being on treatment for his cancer (was having low bp's).  ROS: chronic fatigue.  No fevers, rare cough, no rash, no polyuria or polydipsia  Past Medical History:  Diagnosis Date  . Asthma    as a child  . Cataract    multiple types, bilateral  . Cholelithiasis without cholecystitis   . Chronic diastolic CHF (congestive heart failure) ([place]) 02/25/2009  . Chronic renal insufficiency, stage III (moderate) 2017   Stage II/III (GFR around 60)  . 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 COPD (chronic obstructive pulmonary disease) ([place])   . Coronary artery disease    chronically occluded LAD and diagonal with right to left collaterals, mild disease in the left circ and moderate disease in the mid RCA on medical management with Imdur, ASA, and Plavix.  . Dermatitis 05/2014  . DIABETES MELLITUS, TYPE II 06/24/2007   No diab retpthy as of 08/05/15 eye exam.   . 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 Pyrtle--EGD).  CT C/A/P showed metastatic adenopathy in mediastinum and upper abdomen 05/09/16.  Tx plan is palliative radiation (completed 06/22/16), then palliative systemic chemotherapy (carbo+taxol) was started but as of 08/03/16 onc f/u this was held due to severe knee and ankle arthralgias.  Restarting as of 10/2015 (08/2016 CT showed some disease regression  . Esophageal cancer ([place])    CT 12/2016 showed no residual esoph mass--plan to continue chemo.  Ongoing palliative chemo with carboplatin and taxol q 2 wks as of 03/2017 onc f/u (CT 12/2016 and 04/2017 showed no progression of dz).  Taking a 2 mo break from chemo as of 05/2017.  . Essential hypertension 05/01/2007   Qualifier: Diagnosis of  By: Tiney Rouge CMA, Ellison Hughs     . History of kidney stones   . Hyperkalemia 12/2015   Decreased ACE-I by 50% in response, then potassium normalized.  Marland Kitchen HYPERLIPIDEMIA 06/24/2007  . Hypoxemia 01/27/2010  . Myocardial infarction [facility])    pt states he was informed per MD that he has had one but pt was unaware   . OBESITY 09/02/2008  . Obesity hypoventilation syndrome (HCC)    oxygen 24/7 (2 liters [place] as of 02/2016)  . On home oxygen therapy    Oxygen @ 2l/m nasally 24/7 hours  . OSA (obstructive sleep apnea)  not tested; pt scored 4 per stop bang tool results sent to PCP   . OSTEOARTHRITIS 05/01/2007  . Pancytopenia due to chemotherapy ([place]) 2018  . Presbycusis of both ears 05/2015   [facility]  . Pruritic condition 05/2014   Allergist summer 2015, no new testing.  [name] field defect 05/10/16   Per Dr. Melina Fiddler, O.D.: Rt, loss of inf/temp quad and some loss sup/temp.  ?CVA  ? Pituitary tumor? ?Brain met.    Past Surgical History:  Procedure Laterality Date  . APPENDECTOMY    . BALLOON DILATION N/A 05/04/2016   Procedure: BALLOON DILATION;  Surgeon: Jerene Bears, MD;  Location: [facility];  Service: Gastroenterology;  Laterality: N/A;   . CARDIAC CATHETERIZATION    . CARPAL TUNNEL RELEASE Left   . CATARACT EXTRACTION W/PHACO Right 04/29/2013   Procedure: CATARACT EXTRACTION PHACO AND INTRAOCULAR LENS PLACEMENT (IOC);  Surgeon: Adonis Brook, MD;  Location: [facility];  Service: Ophthalmology;  Laterality: Right;  . CATARACT EXTRACTION, BILATERAL    . COLONOSCOPY W/ POLYPECTOMY  08/2011   Many polyps--all hyperplastic, severe diverticulosis, int hem.  [employer] hemoccult testing via [facility] 06/22/15 NEG  . ESOPHAGOGASTRODUODENOSCOPY N/A 05/04/2016   Procedure: ESOPHAGOGASTRODUODENOSCOPY (EGD);  Surgeon: Jerene Bears, MD;  Location: [facility];  Service: Gastroenterology;  Laterality: N/A;  . IR GENERIC HISTORICAL  10/03/2016   IR US GUIDE VASC ACCESS RIGHT 10/03/2016 Greggory Keen, MD [facility]  . IR GENERIC HISTORICAL  10/03/2016   IR FLUORO GUIDE PORT INSERTION RIGHT 10/03/2016 Greggory Keen, MD [facility]  . KNEE ARTHROSCOPY WITH MEDIAL MENISECTOMY Right 12/16/2014   Procedure: RIGHT KNEE ARTHROSCOPY WITH MEDIAL MENISECTOMY microfracture medial femoral condyle abrasion condroplasty medial femoral condyle lateral menisectomy;  Surgeon: Tobi Bastos, MD;  Location: [facility] ORS;  Service: Orthopedics;  Laterality: Right;  . LEFT HEART CATHETERIZATION WITH CORONARY ANGIOGRAM N/A 10/26/2013   Procedure: LEFT HEART CATHETERIZATION WITH CORONARY ANGIOGRAM;  Surgeon: Jettie Booze, MD;  Location: [facility] CATH LAB;  Service: Cardiovascular;  Laterality: N/A;  . LUMBAR [place] SURGERY  2001  . SHOULDER SURGERY Right    right fx  . TONSILLECTOMY    . TRANSTHORACIC ECHOCARDIOGRAM  03/2016   EF 55-60%, grade I DD, LAE  . WRIST SURGERY Right    right fx    Outpatient Medications Prior to Visit  Medication Sig Dispense Refill  . aspirin 81 MG tablet Take 81 mg by mouth daily.      Marland Kitchen atorvastatin (LIPITOR) 80 MG tablet TAKE ONE TABLET BY MOUTH ONCE DAILY 90 tablet 2  . beta carotene w/minerals (OCUVITE) tablet Take 1 tablet by mouth daily.     . cetirizine (ZYRTEC) 10 MG tablet Take 10 mg by mouth daily.    . citalopram (CELEXA) 20 MG tablet TAKE 1 TABLET BY MOUTH ONCE DAILY 90 tablet 1  . clonazePAM (KLONOPIN) 1 MG tablet TAKE ONE TABLET BY MOUTH TWICE DAILY AS NEEDED FOR ANXIETY 60 tablet 5  . clotrimazole-betamethasone (LOTRISONE) cream Apply to affected area twice daily 45 g 2  . ezetimibe (ZETIA) 10 MG tablet Take 1 tablet (10 mg total) by mouth daily. 90 tablet 3  . gabapentin (NEURONTIN) 300 MG capsule TAKE ONE CAPSULE BY MOUTH THREE TIMES DAILY 270 capsule 3  . hydrOXYzine (ATARAX/VISTARIL) 25 MG tablet TAKE 1 TABLET BY MOUTH AT SUPPER AND 1 TAB AT BEDTIME FOR INSOMNIA 60 tablet 6  . Insulin Lispro (HUMALOG KWIKPEN) 200 UNIT/ML SOPN Inject 20 Units into the skin  2 (two) times daily with a meal. (Patient taking differently: Inject 16 Units into the skin 2 (two) times daily with a meal. Takes  16 units  Twice  Daily.) 18 pen 6  . Insulin NPH, Human,, Isophane, (HUMULIN N) 100 UNIT/ML Kiwkpen 16 units every morning and 16 units every evening 15 mL 3  . magnesium chloride (SLOW-MAG) 64 MG TBEC SR tablet Take 1 tablet (64 mg total) by mouth daily. 60 tablet 0  . metFORMIN (GLUCOPHAGE) 1000 MG tablet TAKE 1 TABLET BY MOUTH TWICE DAILY WITH MEALS 180 tablet 1  . Omega-3 Fatty Acids ([employer] FISH OIL) 1000 MG CAPS Take 2 tablets by mouth 2 (two) times daily. 90 capsule   . ondansetron (ZOFRAN) 8 MG tablet Take 1 tablet (8 mg total) by mouth 2 (two) times daily as needed for refractory nausea / vomiting. Start on day 3 after chemo. 30 tablet 1  . ONE TOUCH ULTRA TEST test strip USE  STRIP TO CHECK GLUCOSE THREE TIMES DAILY AS  DIRECTED 100 each 11  . ONETOUCH DELICA LANCETS 62G MISC USE ONE  TO CHECK GLUCOSE THREE TIMES DAILY 100 each 11  . oxyCODONE (OXY IR/ROXICODONE) 5 MG immediate release tablet Take 1 tablet (5 mg total) by mouth every 8 (eight) hours as needed for severe pain. 30 tablet 0  . OXYGEN Inhale 2 L/min into the lungs  continuous.    . pantoprazole (PROTONIX) 40 MG tablet TAKE 1 TABLET BY MOUTH TWICE DAILY 60 tablet 2  . prochlorperazine (COMPAZINE) 10 MG tablet Take 1 tablet (10 mg total) by mouth every 6 (six) hours as needed (Nausea or vomiting). 30 tablet 1  . cromolyn (OPTICROM) 4 % ophthalmic solution Place 1 drop into both eyes 4 (four) times daily.     Facility-Administered Medications Prior to Visit  Medication Dose Route Frequency Provider Last Rate Last Dose  . sodium chloride flush (NS) 0.9 % injection 10 mL  10 mL Intravenous PRN Truitt Merle, MD   10 mL at 04/16/17 1459  . sodium chloride flush (NS) 0.9 % injection 10 mL  10 mL Intravenous PRN Truitt Merle, MD   10 mL at 05/02/17 1725    No Known Allergies  ROS As per HPI  PE: Blood pressure 130/70, pulse (!) 104, resp. rate 18, height 5' 8" (1.727 m), weight 283 lb 12.8 oz (128.7 kg), SpO2 98 %. Gen: Alert, well appearing.  Patient is oriented to person, place, time, and situation. AFFECT: pleasant, lucid thought and speech. CV: RRR, no m/r/g.   LUNGS: CTA bilat, nonlabored resps, good aeration in all lung fields. Left hip/glut region without any tenderness. EXT: no pitting edema.  No cyanosis.  LABS:  Lab Results  Component Value Date   TSH 2.940 08/28/2016   Lab Results  Component Value Date   WBC 7.7 07/18/2017   HGB 11.2 (L) 07/18/2017   HCT 35.0 (L) 07/18/2017   MCV 93.5 07/18/2017   PLT 129 (L) 07/18/2017   Lab Results  Component Value Date   CREATININE 0.9 07/18/2017   BUN 13.8 07/18/2017   NA 140 07/18/2017   K 3.7 07/18/2017   CL 97 11/28/2016   CO2 27 07/18/2017   Lab Results  Component Value Date   ALT 38 07/18/2017   AST 44 (H) 07/18/2017   ALKPHOS 89 07/18/2017   BILITOT 0.79 07/18/2017   Lab Results  Component Value Date   CHOL 96 (L) 11/28/2016   Lab Results  Component Value Date  HDL 37 (L) 11/28/2016   Lab Results  Component Value Date   LDLCALC 25 11/28/2016   Lab Results  Component  Value Date   TRIG 168 (H) 11/28/2016   Lab Results  Component Value Date   CHOLHDL 2.6 11/28/2016   Lab Results  Component Value Date   HGBA1C 6.3 04/25/2017   POC HbA1c today is 6.1%.  IMPRESSION AND PLAN:  1) DM 2: A1c is great: home monitoring shows fasting that does not correlate with A1c. However, no changes in insulin dosing recommended today. Continue current insulin dosing and metformin.  2) Left upper hip muscle pain/spasms:  Baclofen 5m, 1/2-1 tid prn muscle spasms.  3) HTN: no bp meds needed since getting chemo for his esophagus.  4) Chronic hypoxic resp failure: COPD + obesity hypoventilation syndrome--stable. Continue home supplemental oxygen.  Currently on no controller therapy.  FOLLOW UP:  367mo/u chronic illness.  Signed:  PhCrissie SicklesMD           07/26/2017

## 2017-07-26 NOTE — Patient Instructions (Addendum)
Schedule eye exam.   Continue doing brain stimulating activities (puzzles, reading, adult coloring books, staying active) to keep memory sharp.    Health Maintenance, Male A healthy lifestyle and preventive care is important for your health and wellness. Ask your health care provider about what schedule of regular examinations is right for you. What should I know about weight and diet? Eat a Healthy Diet  Eat plenty of vegetables, fruits, whole grains, low-fat dairy products, and lean protein.  Do not eat a lot of foods high in solid fats, added sugars, or salt.  Maintain a Healthy Weight Regular exercise can help you achieve or maintain a healthy weight. You should:  Do at least 150 minutes of exercise each week. The exercise should increase your heart rate and make you sweat (moderate-intensity exercise).  Do strength-training exercises at least twice a week.  Watch Your Levels of Cholesterol and Blood Lipids  Have your blood tested for lipids and cholesterol every 5 years starting at 72 years of age. If you are at high risk for heart disease, you should start having your blood tested when you are 72 years old. You may need to have your cholesterol levels checked more often if: ? Your lipid or cholesterol levels are high. ? You are older than 72 years of age. ? You are at high risk for heart disease.  What should I know about cancer screening? Many types of cancers can be detected early and may often be prevented. Lung Cancer  You should be screened every year for lung cancer if: ? You are a current smoker who has smoked for at least 30 years. ? You are a former smoker who has quit within the past 15 years.  Talk to your health care provider about your screening options, when you should start screening, and how often you should be screened.  Colorectal Cancer  Routine colorectal cancer screening usually begins at 72 years of age and should be repeated every 5-10 years until  you are 72 years old. You may need to be screened more often if early forms of precancerous polyps or small growths are found. Your health care provider may recommend screening at an earlier age if you have risk factors for colon cancer.  Your health care provider may recommend using home test kits to check for hidden blood in the stool.  A small camera at the end of a tube can be used to examine your colon (sigmoidoscopy or colonoscopy). This checks for the earliest forms of colorectal cancer.  Prostate and Testicular Cancer  Depending on your age and overall health, your health care provider may do certain tests to screen for prostate and testicular cancer.  Talk to your health care provider about any symptoms or concerns you have about testicular or prostate cancer.  Skin Cancer  Check your skin from head to toe regularly.  Tell your health care provider about any new moles or changes in moles, especially if: ? There is a change in a mole's size, shape, or color. ? You have a mole that is larger than a pencil eraser.  Always use sunscreen. Apply sunscreen liberally and repeat throughout the day.  Protect yourself by wearing long sleeves, pants, a wide-brimmed hat, and sunglasses when outside.  What should I know about heart disease, diabetes, and high blood pressure?  If you are 18-39 years of age, have your blood pressure checked every 3-5 years. If you are 40 years of age or older, have your   blood pressure checked every year. You should have your blood pressure measured twice-once when you are at a hospital or clinic, and once when you are not at a hospital or clinic. Record the average of the two measurements. To check your blood pressure when you are not at a hospital or clinic, you can use: ? An automated blood pressure machine at a pharmacy. ? A home blood pressure monitor.  Talk to your health care provider about your target blood pressure.  If you are between 45-79 years  old, ask your health care provider if you should take aspirin to prevent heart disease.  Have regular diabetes screenings by checking your fasting blood sugar level. ? If you are at a normal weight and have a low risk for diabetes, have this test once every three years after the age of 45. ? If you are overweight and have a high risk for diabetes, consider being tested at a younger age or more often.  A one-time screening for abdominal aortic aneurysm (AAA) by ultrasound is recommended for men aged 65-75 years who are current or former smokers. What should I know about preventing infection? Hepatitis B If you have a higher risk for hepatitis B, you should be screened for this virus. Talk with your health care provider to find out if you are at risk for hepatitis B infection. Hepatitis C Blood testing is recommended for:  Everyone born from 1945 through 1965.  Anyone with known risk factors for hepatitis C.  Sexually Transmitted Diseases (STDs)  You should be screened each year for STDs including gonorrhea and chlamydia if: ? You are sexually active and are younger than 72 years of age. ? You are older than 72 years of age and your health care provider tells you that you are at risk for this type of infection. ? Your sexual activity has changed since you were last screened and you are at an increased risk for chlamydia or gonorrhea. Ask your health care provider if you are at risk.  Talk with your health care provider about whether you are at high risk of being infected with HIV. Your health care provider may recommend a prescription medicine to help prevent HIV infection.  What else can I do?  Schedule regular health, dental, and eye exams.  Stay current with your vaccines (immunizations).  Do not use any tobacco products, such as cigarettes, chewing tobacco, and e-cigarettes. If you need help quitting, ask your health care provider.  Limit alcohol intake to no more than 2 drinks  per day. One drink equals 12 ounces of beer, 5 ounces of wine, or 1 ounces of hard liquor.  Do not use street drugs.  Do not share needles.  Ask your health care provider for help if you need support or information about quitting drugs.  Tell your health care provider if you often feel depressed.  Tell your health care provider if you have ever been abused or do not feel safe at home. This information is not intended to replace advice given to you by your health care provider. Make sure you discuss any questions you have with your health care provider. Document Released: 04/12/2008 Document Revised: 06/13/2016 Document Reviewed: 07/19/2015 Elsevier Interactive Patient Education  2018 [employer]vier Inc.  

## 2017-07-28 ENCOUNTER — Other Ambulatory Visit: Payer: Self-pay | Admitting: Family Medicine

## 2017-07-28 NOTE — Progress Notes (Signed)
AWV reviewed and agree.  Signed:  Crissie Sickles, MD           07/28/2017

## 2017-07-29 ENCOUNTER — Other Ambulatory Visit: Payer: Self-pay | Admitting: Internal Medicine

## 2017-07-29 NOTE — Telephone Encounter (Signed)
[place].  RF request for clonazepam LOV: 07/26/17 Next ov: 10/25/17 Last written: 01/01/17 #60 w/ 5RF  Please advise. Thanks.

## 2017-07-29 NOTE — Telephone Encounter (Signed)
Rx faxed

## 2017-08-07 DIAGNOSIS — J449 Chronic obstructive pulmonary disease, unspecified: Secondary | ICD-10-CM | POA: Diagnosis not present

## 2017-08-12 ENCOUNTER — Encounter: Payer: Self-pay | Admitting: Family Medicine

## 2017-08-28 ENCOUNTER — Other Ambulatory Visit: Payer: Self-pay | Admitting: Family Medicine

## 2017-08-28 MED ORDER — PANTOPRAZOLE SODIUM 40 MG PO TBEC: 40.0000 mg | DELAYED_RELEASE_TABLET | Freq: Two times a day (BID) | ORAL | 6 refills | Status: DC

## 2017-08-28 NOTE — Telephone Encounter (Signed)
Please advise. Thanks.   I seen were Dr. Vena Rua office sent a refill on 07/30/17 for #60 w/ 0RF with note stating pt needed office visit.

## 2017-08-28 NOTE — Telephone Encounter (Signed)
Pt advised that Rx has been sent to pharmacy.

## 2017-08-28 NOTE — Telephone Encounter (Signed)
Patient requesting refill pantoprazole (PROTONIX) 40 MG tablet.  States Dr. Hilarie Fredrickson previously prescribed this but he is no longer seeing Dr. Hilarie Fredrickson, needs pcp to refill.  Pharmacy:  [facility], [place]. [phone] (Phone) [phone] (Fax)

## 2017-08-29 ENCOUNTER — Ambulatory Visit (HOSPITAL_COMMUNITY): Payer: Medicare Other

## 2017-08-29 DIAGNOSIS — J90 Pleural effusion, not elsewhere classified: Secondary | ICD-10-CM

## 2017-08-29 HISTORY — DX: Pleural effusion, not elsewhere classified: J90

## 2017-09-05 ENCOUNTER — Ambulatory Visit (HOSPITAL_BASED_OUTPATIENT_CLINIC_OR_DEPARTMENT_OTHER): Payer: Medicare Other

## 2017-09-05 ENCOUNTER — Ambulatory Visit (HOSPITAL_COMMUNITY)
Admission: RE | Admit: 2017-09-05 | Discharge: 2017-09-05 | Disposition: A | Discharge status: Home / Self Care | Payer: Medicare Other | Source: Ambulatory Visit | Attending: Hematology | Admitting: Hematology

## 2017-09-05 ENCOUNTER — Other Ambulatory Visit (HOSPITAL_BASED_OUTPATIENT_CLINIC_OR_DEPARTMENT_OTHER): Payer: Medicare Other

## 2017-09-05 ENCOUNTER — Other Ambulatory Visit: Payer: Self-pay | Admitting: Hematology

## 2017-09-05 VITALS — BP 145/66 | HR 99 | Temp 98.2°F | Resp 18

## 2017-09-05 DIAGNOSIS — Z452 Encounter for adjustment and management of vascular access device: Secondary | ICD-10-CM | POA: Diagnosis not present

## 2017-09-05 DIAGNOSIS — R188 Other ascites: Secondary | ICD-10-CM | POA: Diagnosis not present

## 2017-09-05 DIAGNOSIS — M47816 Spondylosis without myelopathy or radiculopathy, lumbar region: Secondary | ICD-10-CM | POA: Insufficient documentation

## 2017-09-05 DIAGNOSIS — I700 Atherosclerosis of aorta: Secondary | ICD-10-CM | POA: Diagnosis not present

## 2017-09-05 DIAGNOSIS — C160 Malignant neoplasm of cardia: Secondary | ICD-10-CM

## 2017-09-05 DIAGNOSIS — M47814 Spondylosis without myelopathy or radiculopathy, thoracic region: Secondary | ICD-10-CM | POA: Diagnosis not present

## 2017-09-05 DIAGNOSIS — J90 Pleural effusion, not elsewhere classified: Secondary | ICD-10-CM | POA: Insufficient documentation

## 2017-09-05 DIAGNOSIS — K7460 Unspecified cirrhosis of liver: Secondary | ICD-10-CM | POA: Insufficient documentation

## 2017-09-05 DIAGNOSIS — K5730 Diverticulosis of large intestine without perforation or abscess without bleeding: Secondary | ICD-10-CM | POA: Insufficient documentation

## 2017-09-05 DIAGNOSIS — K8020 Calculus of gallbladder without cholecystitis without obstruction: Secondary | ICD-10-CM | POA: Diagnosis not present

## 2017-09-05 DIAGNOSIS — C159 Malignant neoplasm of esophagus, unspecified: Secondary | ICD-10-CM | POA: Diagnosis not present

## 2017-09-05 DIAGNOSIS — M160 Bilateral primary osteoarthritis of hip: Secondary | ICD-10-CM | POA: Diagnosis not present

## 2017-09-05 DIAGNOSIS — M5136 Other intervertebral disc degeneration, lumbar region: Secondary | ICD-10-CM | POA: Diagnosis not present

## 2017-09-05 DIAGNOSIS — Z95828 Presence of other vascular implants and grafts: Secondary | ICD-10-CM

## 2017-09-05 LAB — COMPREHENSIVE METABOLIC PANEL
ALBUMIN: 3.2 g/dL — AB (ref 3.5–5.0)
ALK PHOS: 67 U/L (ref 40–150)
ALT: 33 U/L (ref 0–55)
ANION GAP: 10 meq/L (ref 3–11)
AST: 35 U/L — ABNORMAL HIGH (ref 5–34)
BUN: 9.6 mg/dL (ref 7.0–26.0)
CALCIUM: 9.1 mg/dL (ref 8.4–10.4)
CHLORIDE: 101 meq/L (ref 98–109)
CO2: 29 mEq/L (ref 22–29)
Creatinine: 0.8 mg/dL (ref 0.7–1.3)
EGFR: >60 mL/min/{1.73_m2} (ref >60)
Glucose: 122 mg/dl (ref 70–140)
POTASSIUM: 3.9 meq/L (ref 3.5–5.1)
Sodium: 140 mEq/L (ref 136–145)
Total Bilirubin: 0.67 mg/dL (ref 0.20–1.20)
Total Protein: 6.9 g/dL (ref 6.4–8.3)

## 2017-09-05 LAB — CBC WITH DIFFERENTIAL/PLATELET
BASO%: 0.6 % (ref 0.0–2.0)
BASOS ABS: 0 10*3/uL (ref 0.0–0.1)
EOS ABS: 0.2 10*3/uL (ref 0.0–0.5)
EOS%: 4.6 % (ref 0.0–7.0)
HEMATOCRIT: 40.2 % (ref 38.4–49.9)
HEMOGLOBIN: 12.1 g/dL — AB (ref 13.0–17.1)
LYMPH#: 0.9 10*3/uL (ref 0.9–3.3)
LYMPH%: 18.7 % (ref 14.0–49.0)
MCH: 28.7 pg (ref 27.2–33.4)
MCHC: 30.1 g/dL — ABNORMAL LOW (ref 32.0–36.0)
MCV: 95.5 fL (ref 79.3–98.0)
MONO#: 0.6 10*3/uL (ref 0.1–0.9)
MONO%: 11.1 % (ref 0.0–14.0)
NEUT#: 3.3 10*3/uL (ref 1.5–6.5)
NEUT%: 65 % (ref 39.0–75.0)
Platelets: 101 10*3/uL — ABNORMAL LOW (ref 140–400)
RBC: 4.21 10*6/uL (ref 4.20–5.82)
RDW: 15.9 % — AB (ref 11.0–14.6)
WBC: 5 10*3/uL (ref 4.0–10.3)

## 2017-09-05 MED ORDER — SODIUM CHLORIDE 0.9% FLUSH
10.0000 mL | INTRAVENOUS | Status: DC | PRN
  Administered 2017-09-05: 10 mL via INTRAVENOUS
  Filled 2017-09-05: qty 10

## 2017-09-05 MED ORDER — HEPARIN SOD (PORK) LOCK FLUSH 100 UNIT/ML IV SOLN
INTRAVENOUS | Status: AC
  Filled 2017-09-05: qty 5

## 2017-09-05 MED ORDER — IOPAMIDOL (ISOVUE-300) INJECTION 61%
INTRAVENOUS | Status: AC
  Administered 2017-09-05: 100 mL via INTRAVENOUS
  Filled 2017-09-05: qty 100

## 2017-09-05 MED ORDER — IOPAMIDOL (ISOVUE-300) INJECTION 61%
100.0000 mL | Freq: Once | INTRAVENOUS | Status: AC | PRN
Start: 2017-09-05 — End: 2017-09-05
  Administered 2017-09-05: 100 mL via INTRAVENOUS

## 2017-09-05 NOTE — Patient Instructions (Signed)

## 2017-09-06 ENCOUNTER — Encounter: Payer: Self-pay | Admitting: Physician Assistant

## 2017-09-06 NOTE — Progress Notes (Signed)
Cardiology Office Note    Date:  09/09/2017  ID:  [name], DOB 02-08-1946, MRN [id number] PCP:  [name] Sou, MD  Cardiologist:  Dr. Radford [name]   Chief Complaint: f/u CHF, CAD  History of Present Illness:  [name] is a 71 y.o. male with history of CAD (managed medically), morbid obesity, obesity hypoventilation syndrome on home O2,HTN, dyslipidemia, RBBB, chronic LE edema, chronic diastolic CHF, adenoCA of the GE junction (chemo), CKD stage III, DM, thrombocytopenia by labs, hepatic cirrhosis who presents for routine f/u. To recap, cardiac cath 09/2013 showed severely diseased mLAD and diagonal, mild LCx disease, moderate RCA disease, LVEDP 20, EF 50%. 2D Echo 03/2016 showed EF 55-60%, grade 1 DD, mild LAE, calcified mitral annulus. He's been off BP meds since starting treatment of cancer due to low BPs. Saw oncology 06/2017 and at that time affirmed wish to be DNR/DNI. Labs 09/05/17 showed Hgb 12.1, Plt 101, K 3.9, Cr 0.67, AST 35, ALT 33. Last lipids 10/2016 showed trig 168, LDL 25. Recent CT scan by oncology 08/2017 showed no esophageal mass; + hepatic cirrhosis but no compelling findings of hepatic metastatic disease, large right pleural effusion, small but increased amount of ascities and mesenteric edema, aortic atherosclerosos and coronary atherosclerosis.   He returns for 6 month follow-up today with his daughter. In general he states he has been feeling fine and stable. No recent progression or change in chronic DOE. He sleeps propped up chronically, usually having to adjust himself in the middle of the night. There has been no change in this. We discussed finding of large pleural effusion on CT scan. He states it's hard to believe such a thing when he has not had any coughing and otherwise feels well. He is somewhat upset that this appointment was scheduled with a PA and wants to know where his doctor is. No LEE, palpitations, syncope or bleeding.    Past Medical History:    Diagnosis Date  . Asthma    as a child  . CAD in native artery    a. cardiac cath 09/2013 showed severely diseased mLAD and diagonal, mild LCx disease, moderate RCA disease, LVEDP 20, EF 50%.  . Cataract    multiple types, bilateral  . Cholelithiasis without cholecystitis   . Chronic diastolic CHF (congestive heart failure) ([place]) 02/25/2009  . Chronic renal insufficiency, stage III (moderate) ([facility]) 2017   Stage II/III (GFR around 60)  . 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 COPD (chronic obstructive pulmonary disease) ([place])   . Coronary artery disease    chronically occluded LAD and diagonal with right to left collaterals, mild disease in the left circ and moderate disease in the mid RCA on medical management with Imdur, ASA, and Plavix.  . Dermatitis 05/2014  . DIABETES MELLITUS, TYPE II 06/24/2007   No diab retpthy as of 08/05/15 eye exam.  . 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 Pyrtle--EGD).  CT C/A/P showed metastatic adenopathy in mediastinum and upper abdomen 05/09/16.  Tx plan is palliative radiation (completed 06/22/16), then palliative systemic chemotherapy (carbo+taxol) was started but as of 08/03/16 onc f/u this was held due to severe knee and ankle arthralgias.  Restarting as of 10/2015 (08/2016 CT showed some disease regression  . Esophageal cancer ([facility] [place])    CT 12/2016 showed no residual esoph mass--plan  to continue chemo.  Ongoing palliative chemo with carboplatin and taxol q 2 wks as of 03/2017 onc f/u (CT 12/2016 and 04/2017 showed no progression of dz).  Taking a 2 mo break from chemo as of 05/2017.  . Essential hypertension 05/01/2007   Qualifier: Diagnosis of  By: Tiney Rouge CMA, Ellison Hughs     . History of kidney stones   . Hyperkalemia 12/2015   Decreased ACE-I by 50% in response, then potassium normalized.  Marland Kitchen HYPERLIPIDEMIA  06/24/2007  . Hypoxemia 01/27/2010  . Morbid obesity ([place])   . Myocardial infarction [facility])    pt states he was informed per MD that he has had one but pt was unaware   . Obesity hypoventilation syndrome (H[facility])    oxygen 24/7 (2 liters [place] as of 02/2016)  . On home oxygen therapy    Oxygen @ 2l/m nasally 24/7 hours  . OSA (obstructive sleep apnea)    not tested; pt scored 4 per stop bang tool results sent to PCP   . OSTEOARTHRITIS 05/01/2007  . Pancytopenia due to chemotherapy ([place]) 2018  . Presbycusis of both ears 05/2015   [facility]  . Pruritic condition 05/2014   Allergist summer 2015, no new testing.  Marland Kitchen RBBB   . Visual field defect 05/10/16   Per Dr. Melina Fiddler, O.D.: Rt, loss of inf/temp quad and some loss sup/temp.  ?CVA  ? Pituitary tumor? ?Brain met.    Past Surgical History:  Procedure Laterality Date  . APPENDECTOMY    . CARDIAC CATHETERIZATION    . CARPAL TUNNEL RELEASE Left   . CATARACT EXTRACTION, BILATERAL    . COLONOSCOPY W/ POLYPECTOMY  08/2011   Many polyps--all hyperplastic, severe diverticulosis, int hem.  [employer] hemoccult testing via [facility] 06/22/15 NEG  . IR GENERIC HISTORICAL  10/03/2016   IR US GUIDE VASC ACCESS RIGHT 10/03/2016 Greggory Keen, MD [facility]  . IR GENERIC HISTORICAL  10/03/2016   IR FLUORO GUIDE PORT INSERTION RIGHT 10/03/2016 Greggory Keen, MD [facility]  . [place] SURGERY  2001  . SHOULDER SURGERY Right    right fx  . TONSILLECTOMY    . TRANSTHORACIC ECHOCARDIOGRAM  03/2016   EF 55-60%, grade I DD, LAE  . WRIST SURGERY Right    right fx    Current Medications: Current Meds  Medication Sig  . aspirin 81 MG tablet Take 81 mg by mouth daily.    Marland Kitchen atorvastatin (LIPITOR) 80 MG tablet TAKE ONE TABLET BY MOUTH ONCE DAILY  . baclofen (LIORESAL) 10 MG tablet 1/2 - 1 tab po q8h prn muscle spasms  . beta carotene w/minerals (OCUVITE) tablet Take 1 tablet by mouth daily.  . cetirizine (ZYRTEC) 10 MG tablet Take 10 mg by mouth daily.  . citalopram  (CELEXA) 20 MG tablet TAKE 1 TABLET BY MOUTH ONCE DAILY  . clonazePAM (KLONOPIN) 1 MG tablet TAKE 1 TABLET BY MOUTH TWICE DAILY AS NEEDED FOR ANXIETY  . clotrimazole-betamethasone (LOTRISONE) cream Apply to affected area twice daily  . ezetimibe (ZETIA) 10 MG tablet Take 1 tablet (10 mg total) by mouth daily.  Marland Kitchen gabapentin (NEURONTIN) 300 MG capsule TAKE ONE CAPSULE BY MOUTH THREE TIMES DAILY  . hydrOXYzine (ATARAX/VISTARIL) 25 MG tablet TAKE 1 TABLET BY MOUTH AT SUPPER AND 1 TAB AT BEDTIME FOR INSOMNIA  . ibuprofen (ADVIL,MOTRIN) 200 MG tablet Take 400 mg by mouth every 6 (six) hours as needed.  . Insulin Lispro (HUMALOG KWIKPEN) 200 UNIT/ML SOPN Inject 20 Units into the skin  2 (two) times daily with a meal. (Patient taking differently: Inject 16 Units into the skin 2 (two) times daily with a meal. Takes  16 units  Twice  Daily.)  . Insulin NPH, Human,, Isophane, (HUMULIN N) 100 UNIT/ML Kiwkpen 16 units every morning and 16 units every evening  . magnesium chloride (SLOW-MAG) 64 MG TBEC SR tablet Take 1 tablet (64 mg total) by mouth daily.  . metFORMIN (GLUCOPHAGE) 1000 MG tablet TAKE 1 TABLET BY MOUTH TWICE DAILY WITH MEALS  . Omega-3 Fatty Acids ([employer] FISH OIL) 1000 MG CAPS Take 2 tablets by mouth 2 (two) times daily.  . ondansetron (ZOFRAN) 8 MG tablet Take 1 tablet (8 mg total) by mouth 2 (two) times daily as needed for refractory nausea / vomiting. Start on day 3 after chemo.  . ONE TOUCH ULTRA TEST test strip USE  STRIP TO CHECK GLUCOSE THREE TIMES DAILY AS  DIRECTED  . ONETOUCH DELICA LANCETS 92J MISC USE ONE  TO CHECK GLUCOSE THREE TIMES DAILY  . oxyCODONE (OXY IR/ROXICODONE) 5 MG immediate release tablet Take 1 tablet (5 mg total) by mouth every 8 (eight) hours as needed for severe pain.  . OXYGEN Inhale 2 L/min into the lungs continuous.  . pantoprazole (PROTONIX) 40 MG tablet Take 1 tablet (40 mg total) by mouth 2 (two) times daily.  . prochlorperazine (COMPAZINE) 10 MG tablet Take 1  tablet (10 mg total) by mouth every 6 (six) hours as needed (Nausea or vomiting).     Allergies:   Patient has no known allergies.   Social History   Socioeconomic History  . Marital status: Married    Spouse name: susan  . Number of children: 2  . Years of education: None  . Highest education level: None  Social Needs  . Financial resource strain: None  . Food insecurity - worry: None  . Food insecurity - inability: None  . Transportation needs - medical: None  . Transportation needs - non-medical: None  Occupational History  . Occupation: Retired  Tobacco Use  . Smoking status: Former Smoker    Packs/day: 1.50    Years: 30.00    Pack years: 45.00    Types: Cigarettes, Pipe, Cigars    Last attempt to quit: 10/30/1983    Years since quitting: 33.8  . Smokeless tobacco: Former Systems developer    Types: [place] date: 05/15/2016  Substance and Sexual Activity  . Alcohol use: No  . Drug use: No  . Sexual activity: Not Currently  Other Topics Concern  . None  Social History Narrative   Married, one son and one daughter.   His daughter and her two children live with him.   Coffee daily.  Former smoker.  No alcohol.   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   Attends church weekly-   Oxygen continuous     Family History:  Family History  Problem Relation Age of Onset  . Heart attack Mother   . Aneurysm Father   . Alcohol abuse Father   . Diabetes Maternal Aunt   . Colon cancer Neg Hx    ROS:   Please see the history of present illness. All other systems are reviewed and otherwise negative.    PHYSICAL EXAM:   VS:  BP 100/64   Pulse (!) 101   Ht 5' 8" (1.727 m)   Wt 286 lb 9.6 oz (130 kg)   SpO2 97%   BMI  43.58 kg/m   BMI: Body mass index is 43.58 kg/m. GEN: Well nourished, well developed obese WM, chronically ill in no acute distress  HEENT: normocephalic, atraumatic Neck: no JVD, carotid bruits, or masses Cardiac: RRR; no murmurs,  rubs, or gallops, no edema  Respiratory:  Markedly dimininshed BS throughout, with absent BS R Lung 1/2 way up, decreased BS 1/3 up in L lung, otherwise coarse at the apices GI: soft, nontender, nondistended, + BS MS: no deformity or atrophy  Skin: warm and dry, no rash Neuro:  Alert and Oriented x 3, Strength and sensation are intact, follows commands Psych: euthymic mood, full affect  Wt Readings from Last 3 Encounters:  09/09/17 286 lb 9.6 oz (130 kg)  07/26/17 283 lb 12.8 oz (128.7 kg)  07/18/17 277 lb 1.6 oz (125.7 kg)      Studies/Labs Reviewed:   EKG:  EKG was ordered today and personally reviewed by me and demonstrates sinus tach 101bpm, RBBB, occ PVC. No sig change from prior.  Recent Labs: 09/05/2017: ALT 33; BUN 9.6; Creatinine 0.8; HGB 12.1; Platelets 101; Potassium 3.9; Sodium 140   Lipid Panel    Component Value Date/Time   CHOL 96 (L) 11/28/2016 1113   TRIG 168 (H) 11/28/2016 1113   TRIG 268 (HH) 09/06/2006 1555   HDL 37 (L) 11/28/2016 1113   CHOLHDL 2.6 11/28/2016 1113   CHOLHDL 3.6 03/12/2016 1001   VLDL 37 (H) 03/12/2016 1001   LDLCALC 25 11/28/2016 1113   LDLDIRECT 74.0 04/15/2015 0838    Additional studies/ records that were reviewed today include: Summarized above    ASSESSMENT & PLAN:   1. Right pleural effusion - certainly concerning given his history of malignancy, but also could represent a degree of acute on chronic diastolic CHF. He is very skeptical that this finding is actually accurate as he feels fine. His blood pressure is too low for empiric diuretics, but we did discuss thoracentesis. He states he absolutely refuses to consider this at this time. We discussed possibility of diagnostic purpose as well but he declines. He has an appointment with oncology in just a few days. I asked him to please revisit with Dr. Burr Medico at that time. If he continues to decline thoracentesis, trial of low dose diuretic could be entertained but would need close  monitoring of blood pressure given his chronic hypotension. Warning sx discussed; asked him to keep [place] informed of any progressive symptoms. 2. Chronic diastolic CHF - recent CT showed increased right pleural effusion as above, also with mild ascites and possible cirrhosis. Not clear if fluid accumulation is r/t CHF, CA, or cirrhosis. Patient declines thoracentesis for both diagnostic and therapeutic purposes. He states he does not need a diuretic as he feels fine. I am admittedly worried about his trajectory. We discussed sodium and fluid restriction. Unfortunately he says he has used Morton salt lite for a long time and has no plans to give this up anytime soon. When we discussed fluid restriction he states that he's always been told to drink as much as possible. I reiterated recommendation to keep this to approximately 64oz per day, as well as 2049m sodium restriction, but he seems extremely skeptical. 3. CAD - no recent progressive symptoms. Continue ASA, statin, Zetia. 4. HTN - in last few years has had tendency for hypotension. No longer on any meds to control this. 5. Dyslipidemia - recent LFTs OK. LDL earlier this year was OK. Continue present regimen and consider checking fasting lipids at next  visit.  Disposition: Discussed earlier follow-up but patient wishes to keep on track with 6 month follow-up with Dr. Radford [name]. Advised him to please call for f/u sooner if needed.   Medication Adjustments/Labs and Tests Ordered: Current medicines are reviewed at length with the patient today.  Concerns regarding medicines are outlined above. Medication changes, Labs and Tests ordered today are summarized above and listed in the Patient Instructions accessible in Encounters.   Signed, Charlie Pitter, PA-C  09/09/2017 9:39 AM    [address], [place], [place]  [address] Phone: [phone]; Fax: [phone]

## 2017-09-07 DIAGNOSIS — J449 Chronic obstructive pulmonary disease, unspecified: Secondary | ICD-10-CM | POA: Diagnosis not present

## 2017-09-09 ENCOUNTER — Encounter: Payer: Self-pay | Admitting: Physician Assistant

## 2017-09-09 ENCOUNTER — Ambulatory Visit: Payer: Medicare Other | Admitting: Physician Assistant

## 2017-09-09 VITALS — BP 100/64 | HR 101 | Ht 68.0 in | Wt 286.6 lb

## 2017-09-09 DIAGNOSIS — J90 Pleural effusion, not elsewhere classified: Secondary | ICD-10-CM | POA: Diagnosis not present

## 2017-09-09 DIAGNOSIS — I5032 Chronic diastolic (congestive) heart failure: Secondary | ICD-10-CM

## 2017-09-09 DIAGNOSIS — I2510 Atherosclerotic heart disease of native coronary artery without angina pectoris: Secondary | ICD-10-CM | POA: Diagnosis not present

## 2017-09-09 DIAGNOSIS — E785 Hyperlipidemia, unspecified: Secondary | ICD-10-CM | POA: Diagnosis not present

## 2017-09-09 DIAGNOSIS — I10 Essential (primary) hypertension: Secondary | ICD-10-CM | POA: Diagnosis not present

## 2017-09-09 NOTE — Patient Instructions (Addendum)
Medication Instructions:  Your physician recommends that you continue on your current medications as directed. Please refer to the Current Medication list given to you today.   Labwork: None ordered  Testing/Procedures: None ordered  Follow-Up: Your physician wants you to follow-up in: [place] DR. Mallie Snooks will receive a reminder letter in the mail two months in advance. If you don't receive a letter, please call our office to schedule the follow-up appointment.    Any Other Special Instructions Will Be Listed Below (If Applicable).  For patients with congestive heart failure, we give them these special instructions:  1. Follow a low-salt diet - you are allowed no more than 2,000mg  of sodium per day. Watch your fluid intake. In general, you should not be taking in more than 2 liters of fluid per day (no more than 8 glasses per day). This includes sources of water in foods like soup, coffee, tea, milk, etc. 2. Weigh yourself on the same scale at same time of day and keep a log. 3. Call your doctor: (Anytime you feel any of the following symptoms)  - 3lb weight gain overnight or 5lb within a few days - Shortness of breath, with or without a dry hacking cough  - Swelling in the hands, feet or stomach  - If you have to sleep on extra pillows at night in order to breathe   IT IS IMPORTANT TO LET YOUR DOCTOR KNOW EARLY ON IF YOU ARE HAVING SYMPTOMS SO WE CAN HELP YOU!        If you need a refill on your cardiac medications before your next appointment, please call your pharmacy.

## 2017-09-11 NOTE — Progress Notes (Signed)
[place]  [phone] Fax:([phone]) [phone]  Clinic Follow up Note   Patient Care Team: Tammi Sou, MD as PCP - General (Family Medicine) Harold Hedge, Darrick Grinder, MD as Consulting Physician (Allergy and Immunology) Shirley Muscat Loreen Freud, MD as Consulting Physician (Optometry) Sueanne Margarita, MD as Consulting Physician (Cardiology) Pyrtle, Lajuan Lines, MD as Consulting Physician (Gastroenterology) Latanya Maudlin, MD as Consulting Physician (Orthopedic Surgery) Jodi Marble, MD as Consulting Physician (Otolaryngology) Truitt Merle, MD as Consulting Physician (Hematology) Kyung Rudd, MD as Consulting Physician (Radiation Oncology) 09/12/2017     CHIEF COMPLAINTS:  Follow up GE junction cancer  Oncology History   Cancer of cardio-esophageal junction [facility])   Staging form: Stomach, AJCC 7th Edition     Clinical stage from 05/04/2016: Stage IV ([place], N2, M1) - Signed by Truitt Merle, MD on 05/18/2016        Cancer of cardio-esophageal junction ([place])   05/04/2016 Initial Diagnosis    Cancer of cardio-esophageal junction ([place])      05/04/2016 Procedure    EGD showed a large ulcerating mass with no active bleeding at the gastroesophageal junction extending into the gastric cardia. The mass was not obstructing and partially circumferential, extends approximately 5 cm. Nonbleeding erosive gastropathy.      05/04/2016 Initial Biopsy    Esophageal gastric junction biopsy showed a poorly differentiated carcinoma underlying the squamous mucosa. There is lymphovascular invasion. No Intestinal metaplasia. IHC weakly positive CK5/6, p63 (-), favor squamous.        05/09/2016 Imaging    CT CAP w contrast showed mild wall thickening involving the distal esophagus and proximal stomach compatible with known cancer, enlarged mediastinal and upper abdominal lymph nodes are highly suspicious for metastatic adenopathy, propable liver cirrhosis.      06/04/2016 - 06/22/2016 Radiation  Therapy    palliative radiation to esophageal cancer       07/06/2016 - 06/20/2017 Chemotherapy    chemotherapy with weekly carboplatin and taxol, started on 07/06/2016, held 08/21/16-09/28/2016 due to hospitalization, changed to every 2 weeks from 11/30/2016. Chemo stopped due to no evidence of disease on restaging scan.      07/30/2016 Pathology Results    PD-L1 negative expression      08/28/2016 - 08/31/2016 Hospital Admission    The patient was admitted to 4 severe hypoglycemia, dehydration, and acute renal failure. Infection workup was negative. He recovered well with supportive care. His insulin and hypertension medication was held on discharge, except insulin sliding scale.      09/24/2016 Imaging    CT CAP W CONTRAST 09/26/2016 IMPRESSION: Decreased masslike soft tissue prominence at gastroesophageal junction, consistent with decreased size of primary gastroesophageal junction carcinoma. Resolution of paraesophageal and gastrohepatic ligament lymphadenopathy since prior exam. Other sub-cm mediastinal lymph nodes show little or no significant change. Stable indeterminate sub-cm low-attenuation lesion in the liver dome. Probable hepatic cirrhosis. Recommend continued attention on follow-up CT. No new or progressive metastatic disease identified. Cholelithiasis.  No radiographic evidence of cholecystitis. Colonic diverticulosis. No radiographic evidence of diverticulitis. Aortic atherosclerosis and three-vessel coronary artery calcification.      01/07/2017 Imaging    CT CAP w contrast 1. No residual esophageal mass or adenopathy. 2. New nodular airspace opacification in the left lower lobe. Continued attention on followup exams is warranted as metastatic disease cannot be definitively excluded. 3. Cirrhosis. 4. Trace left pleural effusion. 5. Aortic atherosclerosis (ICD10-170.0). Coronary artery calcification. 6. Cholelithiasis. 7. Mild basilar predominant subpleural reticular  densities. Difficult to exclude  interstitial lung disease.      04/29/2017 Imaging    CT CAP w contrast  IMPRESSION: 1. No evidence of metastatic disease. 2. Nodular airspace disease previously seen in the left lower lobe has resolved in the interval. 3. Tiny right pleural effusion, new. Small left pleural effusion, slightly increased. 4. Cirrhosis with splenomegaly. 5.  Aortic atherosclerosis (ICD10-170.0).      09/05/2017 Imaging    CT CAP W Contrast 09/05/17  IMPRESSION: 1. Currently no esophageal mass is visible, nor is there significant mediastinal adenopathy. There is hepatic cirrhosis but no compelling findings of hepatic metastatic disease. 2. Large right pleural effusion is increased from the prior exam. Small stable left pleural effusion. 3. Small but increased amount of ascites and mesenteric edema compared to prior. 4. Other imaging findings of potential clinical significance: Aortic Atherosclerosis (ICD10-I70.0). Coronary atherosclerosis. The markedly severe right glenohumeral arthropathy. Thoracic and lumbar spondylosis with lower lumbar multilevel impingement, and notable intervertebral spurring at T12-L1. Cholelithiasis. Degenerative arthropathy of both hips.        HISTORY OF PRESENTING ILLNESS:  [name] 71 y.o. male is here because of His reason that diagnosed GE junction carcinoma. He is a comment of by his wife and daughter to our multidisciplinary chart clinic today.  He has been having progressive dysphagia and odynophagia for 2 month, he has been eating soft diet only in the past one week. He has pain in the mid chest and epigastric area only when he eats, with burning sensation, no nausea, abdominal bloating, or other discomfort. His appetite has remained well, no weight loss,   He has multiple medical problems, especially heart failure, he has been on oxygen continuously for 5-6 years. He is morbidly obese, has diabetes, hypertension, mild  neuropathy, OSA etc. He has very sedentary life style, he sits in the chair and watch TV most of time during the day. He only comes out for shopping for a few times a week. He uses a [employer], he can walk for a few hundreds feet before he has to stop to catch his breasts. He denies cough or sputum production, no GI discomfort, he has been having mild diarrhea, loose stool, twice daily, no melena or hematochezia.  CURRENT THERAPY: Observation   INTERIM HISTORY:  Mr. Ewings returns for follow-up. He presents to the clinic today accompanied by his wife.  He notes update in his medication list. He was previously on Furosamide given by his previously PCP. His new PCP is Dr. Harvel Ricks. He saw them earlier this month. He also saw his cardiologist. He was offered to get a Thoracentesis previously due to his pleural effusion. He prefers to try a water pill before the procedure. He denies cough, SOB or chest pain.     MEDICAL HISTORY:  Past Medical History:  Diagnosis Date  . Asthma    as a child  . CAD in native artery    a. cardiac cath 09/2013 showed severely diseased mLAD and diagonal, mild LCx disease, moderate RCA disease, LVEDP 20, EF 50%.  . Cataract    multiple types, bilateral  . Cholelithiasis without cholecystitis   . Chronic diastolic CHF (congestive heart failure) ([place]) 02/25/2009  . Chronic renal insufficiency, stage III (moderate) (HCC) 2017   Stage II/III (GFR around 60)  . 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 COPD (chronic  obstructive pulmonary disease) ([place])   . Coronary artery disease    chronically occluded LAD and diagonal with right to left collaterals, mild disease in the left circ and moderate disease in the mid RCA on medical management with Imdur, ASA, and Plavix.  . Dermatitis 05/2014  . DIABETES MELLITUS, TYPE II 06/24/2007   No diab retpthy  as of 08/05/15 eye exam.  . 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 Pyrtle--EGD).  CT C/A/P showed metastatic adenopathy in mediastinum and upper abdomen 05/09/16.  Tx plan is palliative radiation (completed 06/22/16), then palliative systemic chemotherapy (carbo+taxol) was started but as of 08/03/16 onc f/u this was held due to severe knee and ankle arthralgias.  Restarting as of 10/2015 (08/2016 CT showed some disease regression  . Esophageal cancer ([place])    CT 12/2016 showed no residual esoph mass--plan to continue chemo.  Ongoing palliative chemo with carboplatin and taxol q 2 wks as of 03/2017 onc f/u (CT 12/2016 and 04/2017 showed no progression of dz).  Taking a 2 mo break from chemo as of 05/2017.  . Essential hypertension 05/01/2007   Qualifier: Diagnosis of  By: Tiney Rouge CMA, Ellison Hughs     . History of kidney stones   . Hyperkalemia 12/2015   Decreased ACE-I by 50% in response, then potassium normalized.  Marland Kitchen HYPERLIPIDEMIA 06/24/2007  . Hypoxemia 01/27/2010  . Morbid obesity ([place])   . Myocardial infarction [facility])    pt states he was informed per MD that he has had one but pt was unaware   . Obesity hypoventilation syndrome (HCC)    oxygen 24/7 (2 liters [place] as of 02/2016)  . On home oxygen therapy    Oxygen @ 2l/m nasally 24/7 hours  . OSA (obstructive sleep apnea)    not tested; pt scored 4 per stop bang tool results sent to PCP   . OSTEOARTHRITIS 05/01/2007  . Pancytopenia due to chemotherapy ([place]) 2018  . Presbycusis of both ears 05/2015   [facility]  . Pruritic condition 05/2014   Allergist summer 2015, no new testing.  Marland Kitchen RBBB   . Visual field defect 05/10/16   Per Dr. Melina Fiddler, O.D.: Rt, loss of inf/temp quad and some loss sup/temp.  ?CVA  ? Pituitary tumor? ?Brain met.    SURGICAL HISTORY: Past Surgical History:  Procedure Laterality Date  . APPENDECTOMY    . BALLOON DILATION N/A 05/04/2016   Procedure: BALLOON DILATION;  Surgeon: Jerene Bears, MD;  Location: [facility]  ENDOSCOPY;  Service: Gastroenterology;  Laterality: N/A;  . CARDIAC CATHETERIZATION    . CARPAL TUNNEL RELEASE Left   . CATARACT EXTRACTION W/PHACO Right 04/29/2013   Procedure: CATARACT EXTRACTION PHACO AND INTRAOCULAR LENS PLACEMENT (IOC);  Surgeon: Adonis Brook, MD;  Location: [place];  Service: Ophthalmology;  Laterality: Right;  . CATARACT EXTRACTION, BILATERAL    . COLONOSCOPY W/ POLYPECTOMY  08/2011   Many polyps--all hyperplastic, severe diverticulosis, int hem.  [employer] hemoccult testing via [facility] 06/22/15 NEG  . ESOPHAGOGASTRODUODENOSCOPY N/A 05/04/2016   Procedure: ESOPHAGOGASTRODUODENOSCOPY (EGD);  Surgeon: Jerene Bears, MD;  Location: [facility];  Service: Gastroenterology;  Laterality: N/A;  . IR GENERIC HISTORICAL  10/03/2016   IR US GUIDE VASC ACCESS RIGHT 10/03/2016 Greggory Keen, MD [facility]-INTERV RAD  . IR GENERIC HISTORICAL  10/03/2016   IR FLUORO GUIDE PORT INSERTION RIGHT 10/03/2016 Greggory Keen, MD [facility]-INTER[facility] RAD  . KNEE ARTHROSCOPY WITH MEDIAL MENISECTOMY Right 12/16/2014   Procedure: RIGHT KNEE ARTHROSCOPY WITH MEDIAL MENISECTOMY microfracture  medial femoral condyle abrasion condroplasty medial femoral condyle lateral menisectomy;  Surgeon: Tobi Bastos, MD;  Location: [facility] ORS;  Service: Orthopedics;  Laterality: Right;  . LEFT HEART CATHETERIZATION WITH CORONARY ANGIOGRAM N/A 10/26/2013   Procedure: LEFT HEART CATHETERIZATION WITH CORONARY ANGIOGRAM;  Surgeon: Jettie Booze, MD;  Location: [facility] CATH LAB;  Service: Cardiovascular;  Laterality: N/A;  . LUMBAR [place] SURGERY  2001  . SHOULDER SURGERY Right    right fx  . TONSILLECTOMY    . TRANSTHORACIC ECHOCARDIOGRAM  03/2016   EF 55-60%, grade I DD, LAE  . WRIST SURGERY Right    right fx    SOCIAL HISTORY: Social History   Socioeconomic History  . Marital status: Married    Spouse name: susan  . Number of children: 2  . Years of education: Not on file  . Highest education level: Not on file  Social Needs  .  Financial resource strain: Not on file  . Food insecurity - worry: Not on file  . Food insecurity - inability: Not on file  . Transportation needs - medical: Not on file  . Transportation needs - non-medical: Not on file  Occupational History  . Occupation: Retired  Tobacco Use  . Smoking status: Former Smoker    Packs/day: 1.50    Years: 30.00    Pack years: 45.00    Types: Cigarettes, Pipe, Cigars    Last attempt to quit: 10/30/1983    Years since quitting: 33.8  . Smokeless tobacco: Former Systems developer    Types: [place] date: 05/15/2016  Substance and Sexual Activity  . Alcohol use: No  . Drug use: No  . Sexual activity: Not Currently  Other Topics Concern  . Not on file  Social History Narrative   Married, one son and one daughter.   His daughter and her two children live with him.   Coffee daily.  Former smoker.  No alcohol.   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   Attends church weekly-   Oxygen continuous    FAMILY HISTORY: Family History  Problem Relation Age of Onset  . Heart attack Mother   . Aneurysm Father   . Alcohol abuse Father   . Diabetes Maternal Aunt   . Colon cancer Neg Hx     ALLERGIES:  has No Known Allergies.  MEDICATIONS:  Current Outpatient Medications  Medication Sig Dispense Refill  . aspirin 81 MG tablet Take 81 mg by mouth daily.      Marland Kitchen atorvastatin (LIPITOR) 80 MG tablet TAKE ONE TABLET BY MOUTH ONCE DAILY 90 tablet 2  . baclofen (LIORESAL) 10 MG tablet 1/2 - 1 tab po q8h prn muscle spasms 30 each 1  . beta carotene w/minerals (OCUVITE) tablet Take 1 tablet by mouth daily.    . cetirizine (ZYRTEC) 10 MG tablet Take 10 mg by mouth daily.    . citalopram (CELEXA) 20 MG tablet TAKE 1 TABLET BY MOUTH ONCE DAILY 90 tablet 1  . clonazePAM (KLONOPIN) 1 MG tablet TAKE 1 TABLET BY MOUTH TWICE DAILY AS NEEDED FOR ANXIETY 60 tablet 5  . clotrimazole-betamethasone (LOTRISONE) cream Apply to affected area twice daily 45  g 2  . ezetimibe (ZETIA) 10 MG tablet Take 1 tablet (10 mg total) by mouth daily. 90 tablet 3  . furosemide (LASIX) 20 MG tablet Take 1 tablet (20 mg total) daily by mouth. 30 tablet 1  . gabapentin (NEURONTIN) 300 MG capsule  TAKE ONE CAPSULE BY MOUTH THREE TIMES DAILY 270 capsule 3  . hydrOXYzine (ATARAX/VISTARIL) 25 MG tablet TAKE 1 TABLET BY MOUTH AT SUPPER AND 1 TAB AT BEDTIME FOR INSOMNIA 60 tablet 6  . ibuprofen (ADVIL,MOTRIN) 200 MG tablet Take 400 mg by mouth every 6 (six) hours as needed.    . Insulin Lispro (HUMALOG KWIKPEN) 200 UNIT/ML SOPN Inject 20 Units into the skin 2 (two) times daily with a meal. (Patient taking differently: Inject 16 Units into the skin 2 (two) times daily with a meal. Takes  16 units  Twice  Daily.) 18 pen 6  . Insulin NPH, Human,, Isophane, (HUMULIN N) 100 UNIT/ML Kiwkpen 16 units every morning and 16 units every evening 15 mL 3  . magnesium chloride (SLOW-MAG) 64 MG TBEC SR tablet Take 1 tablet (64 mg total) by mouth daily. 60 tablet 0  . metFORMIN (GLUCOPHAGE) 1000 MG tablet TAKE 1 TABLET BY MOUTH TWICE DAILY WITH MEALS 180 tablet 1  . Omega-3 Fatty Acids ([employer] FISH OIL) 1000 MG CAPS Take 2 tablets by mouth 2 (two) times daily. 90 capsule   . ondansetron (ZOFRAN) 8 MG tablet Take 1 tablet (8 mg total) by mouth 2 (two) times daily as needed for refractory nausea / vomiting. Start on day 3 after chemo. 30 tablet 1  . ONE TOUCH ULTRA TEST test strip USE  STRIP TO CHECK GLUCOSE THREE TIMES DAILY AS  DIRECTED 100 each 11  . ONETOUCH DELICA LANCETS 54T MISC USE ONE  TO CHECK GLUCOSE THREE TIMES DAILY 100 each 11  . oxyCODONE (OXY IR/ROXICODONE) 5 MG immediate release tablet Take 1 tablet (5 mg total) by mouth every 8 (eight) hours as needed for severe pain. 30 tablet 0  . OXYGEN Inhale 2 L/min into the lungs continuous.    . pantoprazole (PROTONIX) 40 MG tablet Take 1 tablet (40 mg total) by mouth 2 (two) times daily. 60 tablet 6  . prochlorperazine (COMPAZINE) 10 MG  tablet Take 1 tablet (10 mg total) by mouth every 6 (six) hours as needed (Nausea or vomiting). 30 tablet 1   No current facility-administered medications for this visit.    Facility-Administered Medications Ordered in Other Visits  Medication Dose Route Frequency Provider Last Rate Last Dose  . sodium chloride flush (NS) 0.9 % injection 10 mL  10 mL Intravenous PRN Truitt Merle, MD   10 mL at 04/16/17 1459  . sodium chloride flush (NS) 0.9 % injection 10 mL  10 mL Intravenous PRN Truitt Merle, MD   10 mL at 05/02/17 1725    REVIEW OF SYSTEMS:  Constitutional: Denies fevers, abnormal night sweats  Eyes: Denies blurriness of vision, double vision or watery eyes Ears, nose, mouth, throat, and face: Denies mucositis or sore throat, Respiratory: Denies cough, dyspnea or wheezes Cardiovascular: Denies palpitation, chest discomfort or lower extremity swelling Gastrointestinal:  Denies nausea, heartburn or change in bowel habits  Skin: Denies abnormal skin rashes  Lymphatics: Denies new lymphadenopathy or easy bruising Neurological:Denies numbness, tingling   Behavioral/Psych: Mood is stable, no new changes  MSK: (+)arthritis  All other systems were reviewed with the patient and are negative.  PHYSICAL EXAMINATION:  ECOG PERFORMANCE STATUS: 3 - Symptomatic, >50% confined to bed BP 140/63 (BP Location: Left Arm, Patient Position: Sitting)   Pulse (!) 106   Temp 97.9 F (36.6 C) (Oral)   Resp 18   Ht '5\' 8"'  (1.727 m)   Wt 287 lb 4.8 oz (130.3 kg)   SpO2  91%   BMI 43.68 kg/m  GENERAL:alert, no distress and comfortable, morbid obese, sitting in wheelchair with nasal cannula oxygen  SKIN: skin color, texture, turgor are normal, no rashes or significant lesions EYES: normal, conjunctiva are pink and non-injected, sclera clear OROPHARYNX:no exudate, no erythema and lips, buccal mucosa, and tongue normal  NECK: supple, thyroid normal size, non-tender, without nodularity LYMPH:  no palpable  lymphadenopathy in the cervical, axillary or inguinal LUNGS: (+)  R significant decrease in breath sounds in the bottom of right lung, up to a 3rd of the lung.   HEART: regular rate & rhythm and no murmurs and no lower extremity edema ABDOMEN:abdomen soft, non-tender and normal bowel sounds Musculoskeletal:no cyanosis of digits and no clubbing  PSYCH: alert & oriented x 3 with fluent speech NEURO: no focal motor/sensory deficits EXT: Trace edema, he wears compression stocks, no significant ankle swollen, erythema, or warmness.   LABORATORY DATA:  I have reviewed the data as listed CBC Latest Ref Rng & Units 09/05/2017 07/18/2017 06/20/2017  WBC 4.0 - 10.3 10e3/uL 5.0 7.7 8.3  Hemoglobin 13.0 - 17.1 g/dL 12.1(L) 11.2(L) 10.2(L)  Hematocrit 38.4 - 49.9 % 40.2 35.0(L) 33.9(L)  Platelets 140 - 400 10e3/uL 101(L) 129(L) 93(L)   CMP Latest Ref Rng & Units 09/05/2017 07/18/2017 06/20/2017  Glucose 70 - 140 mg/dl 122 180(H) 137  BUN 7.0 - 26.0 mg/dL 9.6 13.8 11.4  Creatinine 0.7 - 1.3 mg/dL 0.8 0.9 0.8  Sodium 136 - 145 mEq/L 140 140 140  Potassium 3.5 - 5.1 mEq/L 3.9 3.7 4.0  Chloride 96 - 106 mmol/L - - -  CO2 22 - 29 mEq/L '29 27 28  ' Calcium 8.4 - 10.4 mg/dL 9.1 9.0 9.0  Total Protein 6.4 - 8.3 g/dL 6.9 6.7 6.4  Total Bilirubin 0.20 - 1.20 mg/dL 0.67 0.79 0.66  Alkaline Phos 40 - 150 U/L 67 89 103  AST 5 - 34 U/L 35(H) 44(H) 47(H)  ALT 0 - 55 U/L 33 38 57(H)   PATHOLOGY REPORT Diagnosis 05/04/2016 1. Esophagogastric junction, biopsy, ulcerative mass - POORLY DIFFERENTIATED CARCINOMA, SEE COMMENT. 2. Stomach, biopsy - REACTIVE GASTROPATHY. - NEGATIVE FOR HELICOBACTER PYLORI. - NO INTESTINAL METAPLASIA, DYSPLASIA, OR MALIGNANCY. Microscopic Comment 1. The majority of the specimen consists of gastroesophageal mucosa with reflux changes. There is a small focus of tumor underlying the squamous mucosa. There is lymphovascular invasion. There is no background intestinal metaplasia (Barrett's  esophagus). Immunohistochemistry reveals only very focal weak cytokeratin 5/6, negative p63, and negative mucicarmine in the limited remaining tumor. Dr. Saralyn Pilar has reviewed the case. The case was called to Dr. Hilarie Fredrickson on 05/08/2016. 2. A Warthin-Starry stain is performed to determine the possibility of the presence of Helicobacter pylori. The Warthin-Starry stain is negative for organisms of Helicobacter pylori.     RADIOGRAPHIC STUDIES: I have personally reviewed the radiological images as listed and agreed with the findings in the report.  CT CAP W Contrast 09/05/17  IMPRESSION: 1. Currently no esophageal mass is visible, nor is there significant mediastinal adenopathy. There is hepatic cirrhosis but no compelling findings of hepatic metastatic disease. 2. Large right pleural effusion is increased from the prior exam. Small stable left pleural effusion. 3. Small but increased amount of ascites and mesenteric edema compared to prior. 4. Other imaging findings of potential clinical significance: Aortic Atherosclerosis (ICD10-I70.0). Coronary atherosclerosis. The markedly severe right glenohumeral arthropathy. Thoracic and lumbar spondylosis with lower lumbar multilevel impingement, and notable intervertebral spurring at T12-L1. Cholelithiasis. Degenerative arthropathy of  both hips.   CT CAP w contrast 04/29/2017 IMPRESSION: 1. No evidence of metastatic disease. 2. Nodular airspace disease previously seen in the left lower lobe has resolved in the interval. 3. Tiny right pleural effusion, new. Small left pleural effusion, slightly increased. 4. Cirrhosis with splenomegaly. 5.  Aortic atherosclerosis (ICD10-170.0).   CT CAP w contrast 01/07/2017 IMPRESSION: 1. No residual esophageal mass or adenopathy. 2. New nodular airspace opacification in the left lower lobe. Continued attention on followup exams is warranted as metastatic disease cannot be definitively excluded. 3.  Cirrhosis. 4. Trace left pleural effusion. 5. Aortic atherosclerosis (ICD10-170.0). Coronary artery calcification. 6. Cholelithiasis. 7. Mild basilar predominant subpleural reticular densities. Difficult to exclude interstitial lung disease.  EGD 05/04/2016 A large, ulcerating mass with no active bleeding and no stigmata of recent bleeding was found at the gastroesophageal junction extending into the gastric cardia, beginning 40 cm from the incisors. The mass was non-obstructing and partially circumferential (involving one-half of the lumen circumference). The mass extends approximately 5 cm. IMPRESSION:  -Likely malignant esophageal tumor was found at the gastroesophageal junction extending into the gastric cardia. Biopsied. - Non-bleeding erosive gastropathy. Biopsied. - Erythematous duodenopathy. - Normal second portion of the duodenum.  ASSESSMENT & PLAN: 71 y.o. Caucasian male, with multiple comorbidities, including hypertension, diabetes, OSA, coronary artery disease, diastolic CHF, on continuous oxygen, morbid obesity, presented with progressive dysphagia and odynophagia.  1. Cancer of cardio-esophageal junction, poorly differentiated, cTxN2M1 with node metastasis, stage IV  -I previously reviewed his CT scan, EGD, and the biopsy results with patient and his family members in great details. -His biopsy pathology was previously reviewed in our tumor board, the morphology and weakly positive for CK 5/6, fevers squamous cell carcinoma -His CT scan findings are very concerning for distant metastasis to abdominal lymph nodes.  -I previously reviewed the PET scan images with patient and his daughter in person, he has hypermetabolic GE junction tumor, and diffuse adenopathy in mediastinum and upper abdomen. -We previously discussed the aggressive nature of esophageal cancer, an incurable nature of his disease due to the distant metastasis. The goal of therapy is palliative, to prolong his  life and palliate his symptoms. -His tumor was negative for PD-L1, not a candidate for immunotherapy  -He is on first line palliative chemotherapy with weekly carbo and Taxol, every 2 weeks, tolerating low dose better lately. -I previously reviewed his CT scan findings on 09/26/2016 show good partial response to chemoradiation, no new or progressive metastatic disease. - I previously  reviewed his restaging CT scan from 01/07/2017,  Which showed no residual esophageal mass or adenopathy, he has mild new infiltrative change in LLL, possible related to aspiration, he is asymptomatic, will continue monitoring  -He is clinically doing well, tolerating chemotherapy well, lab results reviewed with patient, mild pancytopenia, secondary to chemotherapy, adequate for treatment, we'll continue low-dose carboplatin and Taxol every 2 weeks. Due to the thrombocytopenia, I have decreased his carboplatin to AUC 1.5 previously. -I reviewed his restaging CT scan from 04/29/2017 which showed no evidence of primary or metastatic cancer. -He has been on chemotherapy since early September 2017, last few scans showed no evidence of disease, we discussed the option of holding chemotherapy for 2 months, and repeat scan, versus continued chemotherapy. After a lengthy discussion, patient decided to stop chemotherapy after the next treatment for observation. We discussed the possibility of tumor progression after chemotherapy break. Since he has had excellent response (completed radiographic response), I think the reasonable to give him  a chemotherapy break, and observation him as long as no cancer progression. - Patient has completed chemotherapy. He is now on observation -We reviewed the signs of cancer recurrence. If he notices any pain or new issues he knows to call us  -We discussed his 09/05/17 CT CAP which showed no evidence of cancer in esophagus or distant metastasis. It does show increase of pleural effusion around his right  lung. -I recommend a Thoracentesis and to restart Furosamide. He agrees to try Furosamide 20 mg first and if no improvement he will do Thoracentesis. If he notices decrease in blood pressure or more frequent urination I suggest he cut Furosamide to half tablet, 16m.  -Will do a lab next week and Chest Xray in 3-4 weeks to monitor improvement.  - f/u in 3-4 weeks   2. New right plural effusion -Possible related to his CHF, he was on furosemide 10 mg daily before chemo, which has been held since he started chemo.   -He had a trace pleural effusion on previous scan, now has moderate right pleural effusion, asymptomatic -I recommend a thoracentesis, further workup of etiology of his pleural effusion, and ruled out malignant pleural effusion.  Patient is reluctant to have procedure, but agrees to have one if he does not respond to diuretics -Restart furosemide, 20 mg daily, I called in today    3. CAD, diastolic CHF, on continuous oxygen -He will continue follow-up with his cardiologist Dr. TRadford Pax-His Plavix has been held since the EGD and biopsy, he is on baby aspirin. -Need to watch his fluid intake during his chemotherapy, and also avoid fluid overload from chemo  -he is clinically stable  4. DM, HTN -We previously discussed his blood pressure and glucose need to be monitored closely during the chemotherapy, which may affect his blood glucose and blood pressure -He will continue medication, monitor his sugar and blood pressure at home, and follow-up with his primary care physician. -previously decreased premed dexa before taxol  -History simple has been held due to his borderline low blood pressure -He will see his primary care physician Dr. MAnitra Lauthfor diabetes management.  5. OSA, morbid obesity -We previously discussed healthy diet, I encouraged him to be more physically active, and consider home PT -previously I strongly encouraged him to follow-up with dietitian during the therapy for  nutrition support.  6. Arthralgia -He has baseline osteoarthritis, not physically active -However his arthralgia in his knees and ankle are much worse after chemotherapy but resolved now  -He will use oxycodone 5 mg as needed, constipation management previously reviewed with him. -Leg weakness improved with physical therapy. Previously encouraged the patient to continue with physical therapy.   7. Pancytopenia  -secondary to chemo and anemia of chronic disease  -improved   8. Goal of care discussion, DNR/DNI -We previously discussed the incurable nature of his cancer, and the overall poor prognosis, especially if he does not have good response to chemotherapy or progress on chemo -The patient understands the goal of care is palliative. -Patient is very realistic, understands his cancer is not curable, and he agrees with DO NOT RESUSCITATE and DO NOT INTUBATE   PLAN -Skin reviewed, no evidence of recurrence  -prescribe Furosamide 264monce daily. If his blood pressure drops or he has more frequent urination he should cut to half tablet 1039m-Lab and flush next week 11/23 -Lab, flush, CXR and f/u in 3-4 weeks    All questions were answered. The patient knows to call  the clinic with any problems, questions or concerns.  I spent 20 minutes counseling the patient face to face. The total time spent in the appointment was 25 minutes and more than 35% was on counseling.  This document serves as a record of services personally performed by Truitt Merle, MD. It was created on her behalf by Joslyn Devon, a trained medical scribe. The creation of this record is based on the scribe's personal observations and the provider's statements to them.    I have reviewed the above documentation for accuracy and completeness, and I agree with the above.   Truitt Merle  09/12/2017

## 2017-09-12 ENCOUNTER — Telehealth: Payer: Self-pay | Admitting: Hematology

## 2017-09-12 ENCOUNTER — Encounter: Payer: Self-pay | Admitting: Hematology

## 2017-09-12 ENCOUNTER — Ambulatory Visit (HOSPITAL_BASED_OUTPATIENT_CLINIC_OR_DEPARTMENT_OTHER): Payer: Medicare Other | Admitting: Hematology

## 2017-09-12 VITALS — BP 140/63 | HR 106 | Temp 97.9°F | Resp 18 | Ht 68.0 in | Wt 287.3 lb

## 2017-09-12 DIAGNOSIS — C160 Malignant neoplasm of cardia: Secondary | ICD-10-CM | POA: Diagnosis not present

## 2017-09-12 DIAGNOSIS — I5032 Chronic diastolic (congestive) heart failure: Secondary | ICD-10-CM | POA: Diagnosis not present

## 2017-09-12 DIAGNOSIS — I10 Essential (primary) hypertension: Secondary | ICD-10-CM | POA: Diagnosis not present

## 2017-09-12 DIAGNOSIS — E119 Type 2 diabetes mellitus without complications: Secondary | ICD-10-CM

## 2017-09-12 DIAGNOSIS — J90 Pleural effusion, not elsewhere classified: Secondary | ICD-10-CM

## 2017-09-12 DIAGNOSIS — D61810 Antineoplastic chemotherapy induced pancytopenia: Secondary | ICD-10-CM | POA: Diagnosis not present

## 2017-09-12 MED ORDER — FUROSEMIDE 20 MG PO TABS: 20.0000 mg | ORAL_TABLET | Freq: Every day | ORAL | 1 refills | Status: DC

## 2017-09-12 NOTE — Telephone Encounter (Signed)
Gave avs and calendar for November and December  °

## 2017-09-20 ENCOUNTER — Ambulatory Visit (HOSPITAL_BASED_OUTPATIENT_CLINIC_OR_DEPARTMENT_OTHER): Payer: Medicare Other

## 2017-09-20 ENCOUNTER — Other Ambulatory Visit (HOSPITAL_BASED_OUTPATIENT_CLINIC_OR_DEPARTMENT_OTHER): Payer: Medicare Other

## 2017-09-20 DIAGNOSIS — Z95828 Presence of other vascular implants and grafts: Secondary | ICD-10-CM

## 2017-09-20 DIAGNOSIS — C160 Malignant neoplasm of cardia: Secondary | ICD-10-CM | POA: Diagnosis not present

## 2017-09-20 DIAGNOSIS — Z452 Encounter for adjustment and management of vascular access device: Secondary | ICD-10-CM | POA: Diagnosis not present

## 2017-09-20 LAB — COMPREHENSIVE METABOLIC PANEL
ALT: 52 U/L (ref 0–55)
ANION GAP: 11 meq/L (ref 3–11)
AST: 77 U/L — AB (ref 5–34)
Albumin: 3.3 g/dL — ABNORMAL LOW (ref 3.5–5.0)
Alkaline Phosphatase: 59 U/L (ref 40–150)
BILIRUBIN TOTAL: 0.67 mg/dL (ref 0.20–1.20)
BUN: 13.5 mg/dL (ref 7.0–26.0)
CHLORIDE: 97 meq/L — AB (ref 98–109)
CO2: 30 meq/L — AB (ref 22–29)
CREATININE: 1 mg/dL (ref 0.7–1.3)
Calcium: 9.1 mg/dL (ref 8.4–10.4)
EGFR: >60 mL/min/{1.73_m2} (ref >60)
GLUCOSE: 162 mg/dL — AB (ref 70–140)
Potassium: 3.6 mEq/L (ref 3.5–5.1)
SODIUM: 138 meq/L (ref 136–145)
TOTAL PROTEIN: 6.9 g/dL (ref 6.4–8.3)

## 2017-09-20 LAB — CBC WITH DIFFERENTIAL/PLATELET
BASO%: 0.8 % (ref 0.0–2.0)
BASOS ABS: 0 10*3/uL (ref 0.0–0.1)
EOS ABS: 0.2 10*3/uL (ref 0.0–0.5)
EOS%: 4.7 % (ref 0.0–7.0)
HCT: 36.6 % — ABNORMAL LOW (ref 38.4–49.9)
HGB: 11.8 g/dL — ABNORMAL LOW (ref 13.0–17.1)
LYMPH%: 18.1 % (ref 14.0–49.0)
MCH: 29.1 pg (ref 27.2–33.4)
MCHC: 32.2 g/dL (ref 32.0–36.0)
MCV: 90.5 fL (ref 79.3–98.0)
MONO#: 0.6 10*3/uL (ref 0.1–0.9)
MONO%: 12.8 % (ref 0.0–14.0)
NEUT#: 3.1 10*3/uL (ref 1.5–6.5)
NEUT%: 63.6 % (ref 39.0–75.0)
Platelets: 129 10*3/uL — ABNORMAL LOW (ref 140–400)
RBC: 4.05 10*6/uL — ABNORMAL LOW (ref 4.20–5.82)
RDW: 16.9 % — AB (ref 11.0–14.6)
WBC: 4.9 10*3/uL (ref 4.0–10.3)
lymph#: 0.9 10*3/uL (ref 0.9–3.3)

## 2017-09-20 MED ORDER — SODIUM CHLORIDE 0.9% FLUSH
10.0000 mL | INTRAVENOUS | Status: DC | PRN
  Administered 2017-09-20: 10 mL via INTRAVENOUS
  Filled 2017-09-20: qty 10

## 2017-09-20 MED ORDER — HEPARIN SOD (PORK) LOCK FLUSH 100 UNIT/ML IV SOLN
500.0000 [IU] | Freq: Once | INTRAVENOUS | Status: AC | PRN
  Administered 2017-09-20: 500 [IU] via INTRAVENOUS
  Filled 2017-09-20: qty 5

## 2017-09-22 ENCOUNTER — Encounter: Payer: Self-pay | Admitting: Family Medicine

## 2017-09-24 ENCOUNTER — Telehealth: Payer: Self-pay | Admitting: *Deleted

## 2017-09-24 NOTE — Telephone Encounter (Signed)
-----   Message from Truitt Merle, MD sent at 09/22/2017  8:37 PM EST ----- Please let pt know the lab results, stable overall, it seems he is tolerating lasix well. Thanks.   Truitt Merle  09/22/2017

## 2017-09-24 NOTE — Telephone Encounter (Signed)
Left message for pt to return call tomorrow.  

## 2017-09-30 ENCOUNTER — Other Ambulatory Visit: Payer: Self-pay | Admitting: Cardiology

## 2017-10-02 NOTE — Progress Notes (Signed)
[place]  [phone] Fax:(336) [phone]  Clinic Follow up Note   Patient Care Team: Tammi Sou, MD as PCP - General (Family Medicine) Harold Hedge, Darrick Grinder, MD as Consulting Physician (Allergy and Immunology) Shirley Muscat Loreen Freud, MD as Consulting Physician (Optometry) Sueanne Margarita, MD as Consulting Physician (Cardiology) Pyrtle, Lajuan Lines, MD as Consulting Physician (Gastroenterology) Latanya Maudlin, MD as Consulting Physician (Orthopedic Surgery) Jodi Marble, MD as Consulting Physician (Otolaryngology) Truitt Merle, MD as Consulting Physician (Hematology) Kyung Rudd, MD as Consulting Physician (Radiation Oncology)   Date of Service: 10/03/2017     CHIEF COMPLAINTS:  Follow up GE junction cancer  Oncology History   Cancer of cardio-esophageal junction [facility])   Staging form: Stomach, AJCC 7th Edition     Clinical stage from 05/04/2016: Stage IV (TX, N2, M1) - Signed by Truitt Merle, MD on 05/18/2016        Cancer of cardio-esophageal junction ([place])   05/04/2016 Initial Diagnosis    Cancer of cardio-esophageal junction ([place])      05/04/2016 Procedure    EGD showed a large ulcerating mass with no active bleeding at the gastroesophageal junction extending into the gastric cardia. The mass was not obstructing and partially circumferential, extends approximately 5 cm. Nonbleeding erosive gastropathy.      05/04/2016 Initial Biopsy    Esophageal gastric junction biopsy showed a poorly differentiated carcinoma underlying the squamous mucosa. There is lymphovascular invasion. No Intestinal metaplasia. IHC weakly positive CK5/6, p63 (-), favor squamous.        05/09/2016 Imaging    CT CAP w contrast showed mild wall thickening involving the distal esophagus and proximal stomach compatible with known cancer, enlarged mediastinal and upper abdominal lymph nodes are highly suspicious for metastatic adenopathy, propable liver cirrhosis.      06/04/2016 -  06/22/2016 Radiation Therapy    palliative radiation to esophageal cancer       07/06/2016 - 06/20/2017 Chemotherapy    chemotherapy with weekly carboplatin and taxol, started on 07/06/2016, held 08/21/16-09/28/2016 due to hospitalization, changed to every 2 weeks from 11/30/2016. Chemo stopped due to no evidence of disease on restaging scan.      07/30/2016 Pathology Results    PD-L1 negative expression      08/28/2016 - 08/31/2016 Hospital Admission    The patient was admitted to 4 severe hypoglycemia, dehydration, and acute renal failure. Infection workup was negative. He recovered well with supportive care. His insulin and hypertension medication was held on discharge, except insulin sliding scale.      09/24/2016 Imaging    CT CAP W CONTRAST 09/26/2016 IMPRESSION: Decreased masslike soft tissue prominence at gastroesophageal junction, consistent with decreased size of primary gastroesophageal junction carcinoma. Resolution of paraesophageal and gastrohepatic ligament lymphadenopathy since prior exam. Other sub-cm mediastinal lymph nodes show little or no significant change. Stable indeterminate sub-cm low-attenuation lesion in the liver dome. Probable hepatic cirrhosis. Recommend continued attention on follow-up CT. No new or progressive metastatic disease identified. Cholelithiasis.  No radiographic evidence of cholecystitis. Colonic diverticulosis. No radiographic evidence of diverticulitis. Aortic atherosclerosis and three-vessel coronary artery calcification.      01/07/2017 Imaging    CT CAP w contrast 1. No residual esophageal mass or adenopathy. 2. New nodular airspace opacification in the left lower lobe. Continued attention on followup exams is warranted as metastatic disease cannot be definitively excluded. 3. Cirrhosis. 4. Trace left pleural effusion. 5. Aortic atherosclerosis (ICD10-170.0). Coronary artery calcification. 6. Cholelithiasis. 7. Mild basilar predominant  subpleural  reticular densities. Difficult to exclude interstitial lung disease.      04/29/2017 Imaging    CT CAP w contrast  IMPRESSION: 1. No evidence of metastatic disease. 2. Nodular airspace disease previously seen in the left lower lobe has resolved in the interval. 3. Tiny right pleural effusion, new. Small left pleural effusion, slightly increased. 4. Cirrhosis with splenomegaly. 5.  Aortic atherosclerosis (ICD10-170.0).      09/05/2017 Imaging    CT CAP W Contrast 09/05/17  IMPRESSION: 1. Currently no esophageal mass is visible, nor is there significant mediastinal adenopathy. There is hepatic cirrhosis but no compelling findings of hepatic metastatic disease. 2. Large right pleural effusion is increased from the prior exam. Small stable left pleural effusion. 3. Small but increased amount of ascites and mesenteric edema compared to prior. 4. Other imaging findings of potential clinical significance: Aortic Atherosclerosis (ICD10-I70.0). Coronary atherosclerosis. The markedly severe right glenohumeral arthropathy. Thoracic and lumbar spondylosis with lower lumbar multilevel impingement, and notable intervertebral spurring at T12-L1. Cholelithiasis. Degenerative arthropathy of both hips.        HISTORY OF PRESENTING ILLNESS:  [name] 71 y.o. male is here because of His reason that diagnosed GE junction carcinoma. He is a comment of by his wife and daughter to our multidisciplinary chart clinic today.  He has been having progressive dysphagia and odynophagia for 2 month, he has been eating soft diet only in the past one week. He has pain in the mid chest and epigastric area only when he eats, with burning sensation, no nausea, abdominal bloating, or other discomfort. His appetite has remained well, no weight loss,   He has multiple medical problems, especially heart failure, he has been on oxygen continuously for 5-6 years. He is morbidly obese, has diabetes,  hypertension, mild neuropathy, OSA etc. He has very sedentary life style, he sits in the chair and watch TV most of time during the day. He only comes out for shopping for a few times a week. He uses a [employer], he can walk for a few hundreds feet before he has to stop to catch his breasts. He denies cough or sputum production, no GI discomfort, he has been having mild diarrhea, loose stool, twice daily, no melena or hematochezia.  CURRENT THERAPY: Observation   INTERIM HISTORY:  Mr. Crewe returns for follow-up. He presents to the clinic today accompanied by his wife.  He notes he is doing well. He feels good and has no issues. He stopped his oxygen in clinic because he was running low. He was called earlier today but missed call and was not able to do chest X-Ray this morning. He does not have enough oxygen to get him through a X-Ray this afternoon. He has been taking the lasix in the morning once daily. He has noticed that when he lays down he does not have as much dyspnea as before.    MEDICAL HISTORY:  Past Medical History:  Diagnosis Date  . Asthma    as a child  . CAD in native artery    a. cardiac cath 09/2013 showed severely diseased mLAD and diagonal, mild LCx disease, moderate RCA disease, LVEDP 20, EF 50%.  . Cataract    multiple types, bilateral  . Cholelithiasis without cholecystitis   . Chronic diastolic CHF (congestive heart failure) ([place]) 02/25/2009  . Chronic renal insufficiency, stage III (moderate) (HCC) 2017   Stage II/III (GFR around 60)  . 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 COPD (chronic obstructive pulmonary disease) ([place])   . Coronary artery disease    chronically occluded LAD and diagonal with right to left collaterals, mild disease in the left circ and moderate disease in the mid RCA on medical management with Imdur, ASA, and  Plavix.  . Dermatitis 05/2014  . DIABETES MELLITUS, TYPE II 06/24/2007   No diab retpthy as of 08/05/15 eye exam.  . 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 Pyrtle--EGD).  CT C/A/P showed metastatic adenopathy in mediastinum and upper abdomen 05/09/16.  Tx plan is palliative radiation (completed 06/22/16), then palliative systemic chemotherapy (carbo+taxol) was started but as of 08/03/16 onc f/u this was held due to severe knee and ankle arthralgias.  Restarting as of 10/2015 (08/2016 CT showed some disease regression  . Esophageal cancer ([place])    CT 12/2016 showed no residual esoph mass--plan to continue chemo.  Ongoing palliative chemo with carboplatin and taxol q 2 wks as of 03/2017 onc f/u (CT 12/2016 and 04/2017 showed no progression of dz).  Taking a 2 mo break from chemo as of 05/2017.  . Essential hypertension 05/01/2007   Qualifier: Diagnosis of  By: Tiney Rouge CMA, Ellison Hughs     . History of kidney stones   . Hyperkalemia 12/2015   Decreased ACE-I by 50% in response, then potassium normalized.  Marland Kitchen HYPERLIPIDEMIA 06/24/2007  . Hypoxemia 01/27/2010  . Morbid obesity ([place])   . Myocardial infarction [facility])    pt states he was informed per MD that he has had one but pt was unaware   . Obesity hypoventilation syndrome (HCC)    oxygen 24/7 (2 liters [place] as of 02/2016)  . On home oxygen therapy    Oxygen @ 2l/m nasally 24/7 hours  . OSA (obstructive sleep apnea)    not tested; pt scored 4 per stop bang tool results sent to PCP   . OSTEOARTHRITIS 05/01/2007  . Pancytopenia due to chemotherapy ([place]) 2018  . Pleural effusion on right 08/2017   ? malignant vs CHF: pt refuses diagnostic thoracentesis b/c he states he feels fine, wants to try diuretic first.  . Presbycusis of both ears 05/2015   [facility]  . Pruritic condition 05/2014   Allergist summer 2015, no new testing.  Marland Kitchen RBBB   . Visual field defect 05/10/16   Per Dr. Melina Fiddler, O.D.: Rt, loss of inf/temp quad and some loss  sup/temp.  ?CVA  ? Pituitary tumor? ?Brain met.    SURGICAL HISTORY: Past Surgical History:  Procedure Laterality Date  . APPENDECTOMY    . BALLOON DILATION N/A 05/04/2016   Procedure: BALLOON DILATION;  Surgeon: Jerene Bears, MD;  Location: [facility];  Service: Gastroenterology;  Laterality: N/A;  . CARDIAC CATHETERIZATION    . CARPAL TUNNEL RELEASE Left   . CATARACT EXTRACTION W/PHACO Right 04/29/2013   Procedure: CATARACT EXTRACTION PHACO AND INTRAOCULAR LENS PLACEMENT (IOC);  Surgeon: Adonis Brook, MD;  Location: [place];  Service: Ophthalmology;  Laterality: Right;  . CATARACT EXTRACTION, BILATERAL    . COLONOSCOPY W/ POLYPECTOMY  08/2011   Many polyps--all hyperplastic, severe diverticulosis, int hem.  [employer] hemoccult testing via [facility] 06/22/15 NEG  . ESOPHAGOGASTRODUODENOSCOPY N/A 05/04/2016   Procedure: ESOPHAGOGASTRODUODENOSCOPY (EGD);  Surgeon: Jerene Bears, MD;  Location: [facility];  Service: Gastroenterology;  Laterality: N/A;  . IR GENERIC HISTORICAL  10/03/2016   IR  US GUIDE VASC ACCESS RIGHT 10/03/2016 Greggory Keen, MD [facility]  . IR GENERIC HISTORICAL  10/03/2016   IR FLUORO GUIDE PORT INSERTION RIGHT 10/03/2016 Greggory Keen, MD [facility]  . KNEE ARTHROSCOPY WITH MEDIAL MENISECTOMY Right 12/16/2014   Procedure: RIGHT KNEE ARTHROSCOPY WITH MEDIAL MENISECTOMY microfracture medial femoral condyle abrasion condroplasty medial femoral condyle lateral menisectomy;  Surgeon: Tobi Bastos, MD;  Location: [facility];  Service: Orthopedics;  Laterality: Right;  . LEFT HEART CATHETERIZATION WITH CORONARY ANGIOGRAM N/A 10/26/2013   Procedure: LEFT HEART CATHETERIZATION WITH CORONARY ANGIOGRAM;  Surgeon: Jettie Booze, MD;  Location: [facility] CATH LAB;  Service: Cardiovascular;  Laterality: N/A;  . LUMBAR [place] SURGERY  2001  . SHOULDER SURGERY Right    right fx  . TONSILLECTOMY    . TRANSTHORACIC ECHOCARDIOGRAM  03/2016   EF 55-60%, grade I DD, LAE  . WRIST SURGERY Right     right fx    SOCIAL HISTORY: Social History   Socioeconomic History  . Marital status: Married    Spouse name: susan  . Number of children: 2  . Years of education: Not on file  . Highest education level: Not on file  Social Needs  . Financial resource strain: Not on file  . Food insecurity - worry: Not on file  . Food insecurity - inability: Not on file  . Transportation needs - medical: Not on file  . Transportation needs - non-medical: Not on file  Occupational History  . Occupation: Retired  Tobacco Use  . Smoking status: Former Smoker    Packs/day: 1.50    Years: 30.00    Pack years: 45.00    Types: Cigarettes, Pipe, Cigars    Last attempt to quit: 10/30/1983    Years since quitting: 33.9  . Smokeless tobacco: Former Systems developer    Types: [place] date: 05/15/2016  Substance and Sexual Activity  . Alcohol use: No  . Drug use: No  . Sexual activity: Not Currently  Other Topics Concern  . Not on file  Social History Narrative   Married, one son and one daughter.   His daughter and her two children live with him.   Coffee daily.  Former smoker.  No alcohol.   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   Attends church weekly-   Oxygen continuous    FAMILY HISTORY: Family History  Problem Relation Age of Onset  . Heart attack Mother   . Aneurysm Father   . Alcohol abuse Father   . Diabetes Maternal Aunt   . Colon cancer Neg Hx     ALLERGIES:  has No Known Allergies.  MEDICATIONS:  Current Outpatient Medications  Medication Sig Dispense Refill  . aspirin 81 MG tablet Take 81 mg by mouth daily.      Marland Kitchen atorvastatin (LIPITOR) 80 MG tablet TAKE 1 TABLET BY MOUTH ONCE DAILY 90 tablet 3  . baclofen (LIORESAL) 10 MG tablet 1/2 - 1 tab po q8h prn muscle spasms 30 each 1  . beta carotene w/minerals (OCUVITE) tablet Take 1 tablet by mouth daily.    . cetirizine (ZYRTEC) 10 MG tablet Take 10 mg by mouth daily.    . citalopram (CELEXA) 20 MG  tablet TAKE 1 TABLET BY MOUTH ONCE DAILY 90 tablet 1  . clonazePAM (KLONOPIN) 1 MG tablet TAKE 1 TABLET BY MOUTH TWICE DAILY AS NEEDED FOR ANXIETY 60 tablet 5  . clotrimazole-betamethasone (LOTRISONE) cream Apply to affected  area twice daily 45 g 2  . furosemide (LASIX) 20 MG tablet Take 1 tablet (20 mg total) daily by mouth. 30 tablet 1  . gabapentin (NEURONTIN) 300 MG capsule TAKE ONE CAPSULE BY MOUTH THREE TIMES DAILY 270 capsule 3  . hydrOXYzine (ATARAX/VISTARIL) 25 MG tablet TAKE 1 TABLET BY MOUTH AT SUPPER AND 1 TAB AT BEDTIME FOR INSOMNIA 60 tablet 6  . ibuprofen (ADVIL,MOTRIN) 200 MG tablet Take 400 mg by mouth every 6 (six) hours as needed.    . Insulin Lispro (HUMALOG KWIKPEN) 200 UNIT/ML SOPN Inject 20 Units into the skin 2 (two) times daily with a meal. (Patient taking differently: Inject 16 Units into the skin 2 (two) times daily with a meal. Takes  16 units  Twice  Daily.) 18 pen 6  . Insulin NPH, Human,, Isophane, (HUMULIN N) 100 UNIT/ML Kiwkpen 16 units every morning and 16 units every evening 15 mL 3  . magnesium chloride (SLOW-MAG) 64 MG TBEC SR tablet Take 1 tablet (64 mg total) by mouth daily. 60 tablet 0  . metFORMIN (GLUCOPHAGE) 1000 MG tablet TAKE 1 TABLET BY MOUTH TWICE DAILY WITH MEALS 180 tablet 1  . Omega-3 Fatty Acids ([employer] FISH OIL) 1000 MG CAPS Take 2 tablets by mouth 2 (two) times daily. 90 capsule   . ONE TOUCH ULTRA TEST test strip USE  STRIP TO CHECK GLUCOSE THREE TIMES DAILY AS  DIRECTED 100 each 11  . ONETOUCH DELICA LANCETS 74M MISC USE ONE  TO CHECK GLUCOSE THREE TIMES DAILY 100 each 11  . oxyCODONE (OXY IR/ROXICODONE) 5 MG immediate release tablet Take 1 tablet (5 mg total) by mouth every 8 (eight) hours as needed for severe pain. 30 tablet 0  . OXYGEN Inhale 2 L/min into the lungs continuous.    . pantoprazole (PROTONIX) 40 MG tablet Take 1 tablet (40 mg total) by mouth 2 (two) times daily. 60 tablet 6  . ezetimibe (ZETIA) 10 MG tablet Take 1 tablet (10 mg  total) by mouth daily. 90 tablet 3   No current facility-administered medications for this visit.    Facility-Administered Medications Ordered in Other Visits  Medication Dose Route Frequency Provider Last Rate Last Dose  . sodium chloride flush (NS) 0.9 % injection 10 mL  10 mL Intravenous PRN Truitt Merle, MD   10 mL at 04/16/17 1459  . sodium chloride flush (NS) 0.9 % injection 10 mL  10 mL Intravenous PRN Truitt Merle, MD   10 mL at 05/02/17 1725    REVIEW OF SYSTEMS:  Constitutional: Denies fevers, abnormal night sweats  Eyes: Denies blurriness of vision, double vision or watery eyes Ears, nose, mouth, throat, and face: Denies mucositis or sore throat, Respiratory: Denies cough, wheezes (+) dyspnea upon laying down improved Cardiovascular: Denies palpitation, chest discomfort or lower extremity swelling Gastrointestinal:  Denies nausea, heartburn or change in bowel habits  Skin: Denies abnormal skin rashes  Lymphatics: Denies new lymphadenopathy or easy bruising Neurological:Denies numbness, tingling   Behavioral/Psych: Mood is stable, no new changes  MSK: (+) arthritis  All other systems were reviewed with the patient and are negative.  PHYSICAL EXAMINATION:  ECOG PERFORMANCE STATUS: 3 - Symptomatic, >50% confined to bed BP 105/68 (BP Location: Left Arm, Patient Position: Sitting)   Pulse 98   Temp 98.9 F (37.2 C) (Oral)   Resp (!) 24   Ht _0  (1.727 m)   Wt 284 lb 1.6 oz (128.9 kg)   SpO2 96%   BMI 43.20 kg/m  GENERAL:alert, no distress and comfortable, morbid obese, sitting in wheelchair with nasal cannula oxygen  SKIN: skin color, texture, turgor are normal, no rashes or significant lesions EYES: normal, conjunctiva are pink and non-injected, sclera clear OROPHARYNX:no exudate, no erythema and lips, buccal mucosa, and tongue normal  NECK: supple, thyroid normal size, non-tender, without nodularity LYMPH:  no palpable lymphadenopathy in the cervical, axillary or  inguinal LUNGS: (+) R decreased in breath sounds in the bottom of right lung, less than last visit  HEART: regular rate & rhythm and no murmurs and no lower extremity edema ABDOMEN:abdomen soft, non-tender and normal bowel sounds Musculoskeletal:no cyanosis of digits and no clubbing  PSYCH: alert & oriented x 3 with fluent speech NEURO: no focal motor/sensory deficits EXT: Trace edema, he wears compression stocks, no significant ankle swollen, erythema, or warmness.   LABORATORY DATA:  I have reviewed the data as listed CBC Latest Ref Rng & Units 10/03/2017 09/20/2017 09/05/2017  WBC 4.0 - 10.3 10e3/uL 5.5 4.9 5.0  Hemoglobin 13.0 - 17.1 g/dL 12.1(L) 11.8(L) 12.1(L)  Hematocrit 38.4 - 49.9 % 39.0 36.6(L) 40.2  Platelets 140 - 400 10e3/uL 107(L) 129(L) 101(L)   CMP Latest Ref Rng & Units 10/03/2017 09/20/2017 09/05/2017  Glucose 70 - 140 mg/dl 196(H) 162(H) 122  BUN 7.0 - 26.0 mg/dL 13.9 13.5 9.6  Creatinine 0.7 - 1.3 mg/dL 1.2 1.0 0.8  Sodium 136 - 145 mEq/L 136 138 140  Potassium 3.5 - 5.1 mEq/L 3.8 3.6 3.9  Chloride 96 - 106 mmol/L - - -  CO2 22 - 29 mEq/L 29 30(H) 29  Calcium 8.4 - 10.4 mg/dL 9.3 9.1 9.1  Total Protein 6.4 - 8.3 g/dL 7.4 6.9 6.9  Total Bilirubin 0.20 - 1.20 mg/dL 0.77 0.67 0.67  Alkaline Phos 40 - 150 U/L 68 59 67  AST 5 - 34 U/L 64(H) 77(H) 35(H)  ALT 0 - 55 U/L 50 52 33   PATHOLOGY REPORT Diagnosis 05/04/2016 1. Esophagogastric junction, biopsy, ulcerative mass - POORLY DIFFERENTIATED CARCINOMA, SEE COMMENT. 2. Stomach, biopsy - REACTIVE GASTROPATHY. - NEGATIVE FOR HELICOBACTER PYLORI. - NO INTESTINAL METAPLASIA, DYSPLASIA, OR MALIGNANCY. Microscopic Comment 1. The majority of the specimen consists of gastroesophageal mucosa with reflux changes. There is a small focus of tumor underlying the squamous mucosa. There is lymphovascular invasion. There is no background intestinal metaplasia (Barrett's esophagus). Immunohistochemistry reveals only very focal weak  cytokeratin 5/6, negative p63, and negative mucicarmine in the limited remaining tumor. Dr. Saralyn Pilar has reviewed the case. The case was called to Dr. Hilarie Fredrickson on 05/08/2016. 2. A Warthin-Starry stain is performed to determine the possibility of the presence of Helicobacter pylori. The Warthin-Starry stain is negative for organisms of Helicobacter pylori.     RADIOGRAPHIC STUDIES: I have personally reviewed the radiological images as listed and agreed with the findings in the report.  CT CAP W Contrast 09/05/17  IMPRESSION: 1. Currently no esophageal mass is visible, nor is there significant mediastinal adenopathy. There is hepatic cirrhosis but no compelling findings of hepatic metastatic disease. 2. Large right pleural effusion is increased from the prior exam. Small stable left pleural effusion. 3. Small but increased amount of ascites and mesenteric edema compared to prior. 4. Other imaging findings of potential clinical significance: Aortic Atherosclerosis (ICD10-I70.0). Coronary atherosclerosis. The markedly severe right glenohumeral arthropathy. Thoracic and lumbar spondylosis with lower lumbar multilevel impingement, and notable intervertebral spurring at T12-L1. Cholelithiasis. Degenerative arthropathy of both hips.   CT CAP w contrast 04/29/2017 IMPRESSION: 1. No evidence  of metastatic disease. 2. Nodular airspace disease previously seen in the left lower lobe has resolved in the interval. 3. Tiny right pleural effusion, new. Small left pleural effusion, slightly increased. 4. Cirrhosis with splenomegaly. 5.  Aortic atherosclerosis (ICD10-170.0).   CT CAP w contrast 01/07/2017 IMPRESSION: 1. No residual esophageal mass or adenopathy. 2. New nodular airspace opacification in the left lower lobe. Continued attention on followup exams is warranted as metastatic disease cannot be definitively excluded. 3. Cirrhosis. 4. Trace left pleural effusion. 5. Aortic  atherosclerosis (ICD10-170.0). Coronary artery calcification. 6. Cholelithiasis. 7. Mild basilar predominant subpleural reticular densities. Difficult to exclude interstitial lung disease.  EGD 05/04/2016 A large, ulcerating mass with no active bleeding and no stigmata of recent bleeding was found at the gastroesophageal junction extending into the gastric cardia, beginning 40 cm from the incisors. The mass was non-obstructing and partially circumferential (involving one-half of the lumen circumference). The mass extends approximately 5 cm. IMPRESSION:  -Likely malignant esophageal tumor was found at the gastroesophageal junction extending into the gastric cardia. Biopsied. - Non-bleeding erosive gastropathy. Biopsied. - Erythematous duodenopathy. - Normal second portion of the duodenum.  ASSESSMENT & PLAN: 71 y.o. Caucasian male, with multiple comorbidities, including hypertension, diabetes, OSA, coronary artery disease, diastolic CHF, on continuous oxygen, morbid obesity, presented with progressive dysphagia and odynophagia.  1. Cancer of cardio-esophageal junction, poorly differentiated, cTxN2M1 with node metastasis, stage IV  -I previously reviewed his CT scan, EGD, and the biopsy results with patient and his family members in great details. -His biopsy pathology was previously reviewed in our tumor board, the morphology and weakly positive for CK 5/6, fevers squamous cell carcinoma -His CT scan findings are very concerning for distant metastasis to abdominal lymph nodes.  -I previously reviewed the PET scan images with patient and his daughter in person, he has hypermetabolic GE junction tumor, and diffuse adenopathy in mediastinum and upper abdomen. -We previously discussed the aggressive nature of esophageal cancer, an incurable nature of his disease due to the distant metastasis. The goal of therapy is palliative, to prolong his life and palliate his symptoms. -His tumor was negative  for PD-L1, not a candidate for immunotherapy  -He is on first line palliative chemotherapy with weekly carbo and Taxol, every 2 weeks, tolerating low dose better lately. -I previously reviewed his CT scan findings on 09/26/2016 show good partial response to chemoradiation, no new or progressive metastatic disease. - I previously  reviewed his restaging CT scan from 01/07/2017,  Which showed no residual esophageal mass or adenopathy, he has mild new infiltrative change in LLL, possible related to aspiration, he is asymptomatic, will continue monitoring  -He is clinically doing well, tolerating chemotherapy well, lab results reviewed with patient, mild pancytopenia, secondary to chemotherapy, adequate for treatment, we'll continue low-dose carboplatin and Taxol every 2 weeks. Due to the thrombocytopenia, I have decreased his carboplatin to AUC 1.5 previously. -I reviewed his restaging CT scan from 04/29/2017 which showed no evidence of primary or metastatic cancer. -He has been on chemotherapy since early September 2017, last few scans showed no evidence of disease, we discussed the option of holding chemotherapy for 2 months, and repeat scan, versus continued chemotherapy. After a lengthy discussion, patient decided to stop chemotherapy after the next treatment for observation. We discussed the possibility of tumor progression after chemotherapy break. Since he has had excellent response (completed radiographic response), I think the reasonable to give him a chemotherapy break, and observation him as long as no cancer progression. -  Patient has completed chemotherapy. He is now on observation -We reviewed the signs of cancer recurrence. If he notices any pain or new issues he knows to call us  -We discussed his 09/05/17 CT CAP which showed no evidence of cancer in esophagus or distant metastasis. It does show increase of pleural effusion around his right lung. -I recommend a Thoracentesis and to restart  Furosamide. He agrees to try Furosamide 20 mg first and if no improvement he will do Thoracentesis.  He was previously on Lasix 10 mg daily, which has been held since the started chemo. -He is clinically doing better, less orthopnea, exam showed persistent right pleural effusion, probably less than previously.  I will continue his Lasix 20 mg daily for now, and repeat x-ray on next visit. -Labs reviewed, Hg 12.1, plt 107K. His Cr is 1.2, likely due to Furosemide. Will continue at 14m until next X-ray in 4 weeks - f/u in 4 weeks   2. New right plural effusion -Possible related to his CHF, he was on furosemide 10 mg daily before chemo, which has been held since he started chemo.   -He had a trace pleural effusion on previous scan, now has moderate right pleural effusion, asymptomatic -I recommend a thoracentesis, further workup of etiology of his pleural effusion, and ruled out malignant pleural effusion.  Patient is reluctant to have procedure, but agrees to have one if he does not respond to diuretics -Restart furosemide, 20 mg daily, I called in on 09/12/17  -Upon physical exam he still has some pleural effusion, he will continue furosemide 23mdaily and do Chest X-Ray in 4 weeks . If nearly resolved or better will reduce furosemide to 1061m   3. CAD, diastolic CHF, on continuous oxygen -He will continue follow-up with his cardiologist Dr. TurRadford Paxis Plavix has been held since the EGD and biopsy, he is on baby aspirin. -Need to watch his fluid intake during his chemotherapy, and also avoid fluid overload from chemo  -he is clinically stable  4. DM, HTN -We previously discussed his blood pressure and glucose need to be monitored closely during the chemotherapy, which may affect his blood glucose and blood pressure -He will continue medication, monitor his sugar and blood pressure at home, and follow-up with his primary care physician. -previously decreased premed dexa before taxol    -History simple has been held due to his borderline low blood pressure -He will see his primary care physician Dr. McGAnitra Lauthr diabetes management.  5. OSA, morbid obesity -We previously discussed healthy diet, I encouraged him to be more physically active, and consider home PT -previously I strongly encouraged him to follow-up with dietitian during the therapy for nutrition support.  6. Arthralgia -He has baseline osteoarthritis, not physically active -However his arthralgia in his knees and ankle are much worse after chemotherapy but resolved now  -He will use oxycodone 5 mg as needed, constipation management previously reviewed with him. -Leg weakness improved with physical therapy. Previously encouraged the patient to continue with physical therapy.   7. Pancytopenia  -secondary to chemo and anemia of chronic disease  -improved   8. Goal of care discussion, DNR/DNI -We previously discussed the incurable nature of his cancer, and the overall poor prognosis, especially if he does not have good response to chemotherapy or progress on chemo -The patient understands the goal of care is palliative. -Patient is very realistic, understands his cancer is not curable, and he agrees with DO NOT RESUSCITATE and DO NOT  INTUBATE   PLAN -Lab, flush, CXR before OV and f/u in 4 weeks  -Continue Furosemide 73m daily, if x-ray shows significant improvement of pleural effusion, will change to 10 mg daily after next visit  All questions were answered. The patient knows to call the clinic with any problems, questions or concerns.  I spent 20 minutes counseling the patient face to face. The total time spent in the appointment was 25 minutes and more than 35% was on counseling.  This document serves as a record of services personally performed by YTruitt Merle MD. It was created on her behalf by AJoslyn Devon a trained medical scribe. The creation of this record is based on the scribe's personal  observations and the provider's statements to them.    I have reviewed the above documentation for accuracy and completeness, and I agree with the above.  FTruitt Merle 10/03/2017

## 2017-10-03 ENCOUNTER — Telehealth: Payer: Self-pay | Admitting: Hematology

## 2017-10-03 ENCOUNTER — Ambulatory Visit (HOSPITAL_BASED_OUTPATIENT_CLINIC_OR_DEPARTMENT_OTHER): Payer: Medicare Other | Admitting: Hematology

## 2017-10-03 ENCOUNTER — Ambulatory Visit (HOSPITAL_BASED_OUTPATIENT_CLINIC_OR_DEPARTMENT_OTHER): Payer: Medicare Other

## 2017-10-03 ENCOUNTER — Other Ambulatory Visit (HOSPITAL_BASED_OUTPATIENT_CLINIC_OR_DEPARTMENT_OTHER): Payer: Medicare Other

## 2017-10-03 ENCOUNTER — Other Ambulatory Visit: Payer: Self-pay | Admitting: *Deleted

## 2017-10-03 VITALS — BP 105/68 | HR 98 | Temp 98.9°F | Resp 24 | Ht 68.0 in | Wt 284.1 lb

## 2017-10-03 DIAGNOSIS — E119 Type 2 diabetes mellitus without complications: Secondary | ICD-10-CM

## 2017-10-03 DIAGNOSIS — C160 Malignant neoplasm of cardia: Secondary | ICD-10-CM

## 2017-10-03 DIAGNOSIS — Z452 Encounter for adjustment and management of vascular access device: Secondary | ICD-10-CM | POA: Diagnosis not present

## 2017-10-03 DIAGNOSIS — J90 Pleural effusion, not elsewhere classified: Secondary | ICD-10-CM

## 2017-10-03 DIAGNOSIS — I10 Essential (primary) hypertension: Secondary | ICD-10-CM

## 2017-10-03 DIAGNOSIS — I5032 Chronic diastolic (congestive) heart failure: Secondary | ICD-10-CM | POA: Diagnosis not present

## 2017-10-03 DIAGNOSIS — Z95828 Presence of other vascular implants and grafts: Secondary | ICD-10-CM

## 2017-10-03 LAB — CBC WITH DIFFERENTIAL/PLATELET
BASO%: 0.6 % (ref 0.0–2.0)
Basophils Absolute: 0 10*3/uL (ref 0.0–0.1)
EOS ABS: 0.2 10*3/uL (ref 0.0–0.5)
EOS%: 4 % (ref 0.0–7.0)
HEMATOCRIT: 39 % (ref 38.4–49.9)
HEMOGLOBIN: 12.1 g/dL — AB (ref 13.0–17.1)
LYMPH#: 1 10*3/uL (ref 0.9–3.3)
LYMPH%: 18.9 % (ref 14.0–49.0)
MCH: 29.3 pg (ref 27.2–33.4)
MCHC: 31 g/dL — ABNORMAL LOW (ref 32.0–36.0)
MCV: 94.4 fL (ref 79.3–98.0)
MONO#: 0.7 10*3/uL (ref 0.1–0.9)
MONO%: 11.9 % (ref 0.0–14.0)
NEUT%: 64.6 % (ref 39.0–75.0)
NEUTROS ABS: 3.5 10*3/uL (ref 1.5–6.5)
PLATELETS: 107 10*3/uL — AB (ref 140–400)
RBC: 4.13 10*6/uL — AB (ref 4.20–5.82)
RDW: 16.9 % — ABNORMAL HIGH (ref 11.0–14.6)
WBC: 5.5 10*3/uL (ref 4.0–10.3)

## 2017-10-03 LAB — COMPREHENSIVE METABOLIC PANEL
ALBUMIN: 3.5 g/dL (ref 3.5–5.0)
ALK PHOS: 68 U/L (ref 40–150)
ALT: 50 U/L (ref 0–55)
ANION GAP: 12 meq/L — AB (ref 3–11)
AST: 64 U/L — ABNORMAL HIGH (ref 5–34)
BILIRUBIN TOTAL: 0.77 mg/dL (ref 0.20–1.20)
BUN: 13.9 mg/dL (ref 7.0–26.0)
CALCIUM: 9.3 mg/dL (ref 8.4–10.4)
CO2: 29 mEq/L (ref 22–29)
CREATININE: 1.2 mg/dL (ref 0.7–1.3)
Chloride: 95 mEq/L — ABNORMAL LOW (ref 98–109)
EGFR: 59 mL/min/{1.73_m2} — AB (ref >60)
Glucose: 196 mg/dl — ABNORMAL HIGH (ref 70–140)
Potassium: 3.8 mEq/L (ref 3.5–5.1)
Sodium: 136 mEq/L (ref 136–145)
TOTAL PROTEIN: 7.4 g/dL (ref 6.4–8.3)

## 2017-10-03 MED ORDER — SODIUM CHLORIDE 0.9% FLUSH
10.0000 mL | INTRAVENOUS | Status: DC | PRN
  Administered 2017-10-03: 10 mL via INTRAVENOUS
  Filled 2017-10-03: qty 10

## 2017-10-03 MED ORDER — HEPARIN SOD (PORK) LOCK FLUSH 100 UNIT/ML IV SOLN
500.0000 [IU] | Freq: Once | INTRAVENOUS | Status: DC
  Administered 2017-10-03: 500 [IU] via INTRAVENOUS
  Filled 2017-10-03: qty 5

## 2017-10-03 NOTE — Telephone Encounter (Signed)
Scheduled appt per 12/6 los - Gave patient aVS and calender per los.

## 2017-10-03 NOTE — Patient Instructions (Signed)

## 2017-10-03 NOTE — Telephone Encounter (Signed)
Left message for patient regarding coming in for a chest x ray before his appointment with YF per 12/6 sch msg.

## 2017-10-05 ENCOUNTER — Encounter: Payer: Self-pay | Admitting: Hematology

## 2017-10-07 DIAGNOSIS — J449 Chronic obstructive pulmonary disease, unspecified: Secondary | ICD-10-CM | POA: Diagnosis not present

## 2017-10-20 ENCOUNTER — Encounter: Payer: Self-pay | Admitting: Family Medicine

## 2017-10-21 ENCOUNTER — Ambulatory Visit (HOSPITAL_COMMUNITY)
Admission: RE | Admit: 2017-10-21 | Discharge: 2017-10-21 | Disposition: A | Discharge status: Home / Self Care | Payer: Medicare Other | Source: Ambulatory Visit | Attending: Hematology | Admitting: Hematology

## 2017-10-21 DIAGNOSIS — I5032 Chronic diastolic (congestive) heart failure: Secondary | ICD-10-CM | POA: Insufficient documentation

## 2017-10-21 DIAGNOSIS — J90 Pleural effusion, not elsewhere classified: Secondary | ICD-10-CM | POA: Insufficient documentation

## 2017-10-25 ENCOUNTER — Ambulatory Visit (INDEPENDENT_AMBULATORY_CARE_PROVIDER_SITE_OTHER): Payer: Medicare Other | Admitting: Family Medicine

## 2017-10-25 ENCOUNTER — Encounter: Payer: Self-pay | Admitting: Family Medicine

## 2017-10-25 VITALS — BP 135/80 | HR 84 | Temp 97.6°F | Resp 16 | Wt 289.0 lb

## 2017-10-25 DIAGNOSIS — E118 Type 2 diabetes mellitus with unspecified complications: Secondary | ICD-10-CM | POA: Diagnosis not present

## 2017-10-25 DIAGNOSIS — Z794 Long term (current) use of insulin: Secondary | ICD-10-CM | POA: Diagnosis not present

## 2017-10-25 DIAGNOSIS — F411 Generalized anxiety disorder: Secondary | ICD-10-CM

## 2017-10-25 LAB — POCT GLYCOSYLATED HEMOGLOBIN (HGB A1C): HEMOGLOBIN A1C: 6.8

## 2017-10-25 NOTE — Progress Notes (Signed)
OFFICE VISIT  10/25/2017   CC:  Chief Complaint  Patient presents with  . Follow-up    [facility]   HPI:    Patient is a 71 y.o. Caucasian male who presents for 3 mo f/u DM 2 and chronic anxiety.  DM: 70/30 16 U bid and 16 U humalog bid.  Glucoses ranging fro 140s-200s fasting and 2H PP. No signif polyruria or polydipsia.    Chronic anxiety: citalopram 20mg qd longterm. Clonazepam 1mg q12h regularly. Says he is doing very well on this.  Has some hx of cancer related pain but says he hasn't required narcotic pain meds in "a while".  These meds are rx'd by his oncologist.  Past Medical History:  Diagnosis Date  . Asthma    as a child  . CAD in native artery    a. cardiac cath 09/2013 showed severely diseased mLAD and diagonal, mild LCx disease, moderate RCA disease, LVEDP 20, EF 50%.  . Cataract    multiple types, bilateral  . Cholelithiasis without cholecystitis   . Chronic diastolic CHF (congestive heart failure) (HCC) 02/25/2009  . Chronic renal insufficiency, stage III (moderate) (HCC) 2017   Stage II/III (GFR around 60)  . 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 COPD (chronic obstructive pulmonary disease) (HCC)   . Coronary artery disease    chronically occluded LAD and diagonal with right to left collaterals, mild disease in the left circ and moderate disease in the mid RCA on medical management with Imdur, ASA, and Plavix.  . Dermatitis 05/2014  . DIABETES MELLITUS, TYPE II 06/24/2007   No diab retpthy as of 08/05/15 eye exam.  . 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 Pyrtle--EGD).  CT C/A/P showed metastatic adenopathy in mediastinum and upper abdomen 05/09/16.  Tx plan is palliative radiation (completed 06/22/16), then palliative systemic chemotherapy (carbo+taxol) was started but as of 08/03/16 onc f/u this was held due to  severe knee and ankle arthralgias.  Restarting as of 10/2015 (08/2016 CT showed some disease regression  . Esophageal cancer (HCC)    CT 12/2016 showed no residual esoph mass--plan to continue chemo.  Ongoing palliative chemo with carboplatin and taxol q 2 wks as of 03/2017 onc f/u (CT 12/2016 and 04/2017 showed no progression of dz).  Taking a 2 mo break from chemo as of 05/2017.  . Essential hypertension 05/01/2007   Qualifier: Diagnosis of  By: Archie CMA, Lakisha     . History of kidney stones   . Hyperkalemia 12/2015   Decreased ACE-I by 50% in response, then potassium normalized.  . HYPERLIPIDEMIA 06/24/2007  . Hypoxemia 01/27/2010  . Morbid obesity (HCC)   . Myocardial infarction (HCC)    pt states he was informed per MD that he has had one but pt was unaware   . Obesity hypoventilation syndrome (HCC)    oxygen 24/7 (2 liters [place] as of 02/2016)  . On home oxygen therapy    Oxygen @ 2l/m nasally 24/7 hours  . OSA (obstructive sleep apnea)    not tested; pt scored 4 per stop bang tool results sent to PCP   . OSTEOARTHRITIS 05/01/2007  . Pancytopenia due to chemotherapy (HCC) 2018  . Pleural effusion on right 08/2017   ? malignant vs CHF: pt refuses diagnostic thoracentesis b/c he states he feels fine, wants to try diuretic   first.  . Pleural effusion on right 09/2017   Thoracentesis  . 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 Pruritic condition 05/2014   Allergist summer 2015, no new testing.  Marland Kitchen RBBB   . Visual field defect 05/10/16   Per Dr. Melina Fiddler, O.D.: Rt, loss of inf/temp quad and some loss sup/temp.  ?CVA  ? Pituitary tumor? ?Brain met.    Past Surgical History:  Procedure Laterality Date  . APPENDECTOMY    . BALLOON DILATION N/A 05/04/2016   Procedure: BALLOON DILATION;  Surgeon: Jerene Bears, MD;  Location: [facility];  Service: Gastroenterology;  Laterality: N/A;  . CARDIAC CATHETERIZATION    . CARPAL TUNNEL RELEASE Left   . CATARACT EXTRACTION W/PHACO Right 04/29/2013    Procedure: CATARACT EXTRACTION PHACO AND INTRAOCULAR LENS PLACEMENT (IOC);  Surgeon: Adonis Brook, MD;  Location: [facility];  Service: Ophthalmology;  Laterality: Right;  . CATARACT EXTRACTION, BILATERAL    . COLONOSCOPY W/ POLYPECTOMY  08/2011   Many polyps--all hyperplastic, severe diverticulosis, int hem.  [employer] hemoccult testing via [facility] 06/22/15 NEG  . ESOPHAGOGASTRODUODENOSCOPY N/A 05/04/2016   Procedure: ESOPHAGOGASTRODUODENOSCOPY (EGD);  Surgeon: Jerene Bears, MD;  Location: [facility];  Service: Gastroenterology;  Laterality: N/A;  . IR GENERIC HISTORICAL  10/03/2016   IR US GUIDE VASC ACCESS RIGHT 10/03/2016 Greggory Keen, MD [facility]  . IR GENERIC HISTORICAL  10/03/2016   IR FLUORO GUIDE PORT INSERTION RIGHT 10/03/2016 Greggory Keen, MD [facility]  . KNEE ARTHROSCOPY WITH MEDIAL MENISECTOMY Right 12/16/2014   Procedure: RIGHT KNEE ARTHROSCOPY WITH MEDIAL MENISECTOMY microfracture medial femoral condyle abrasion condroplasty medial femoral condyle lateral menisectomy;  Surgeon: Tobi Bastos, MD;  Location: [facility];  Service: Orthopedics;  Laterality: Right;  . LEFT HEART CATHETERIZATION WITH CORONARY ANGIOGRAM N/A 10/26/2013   Procedure: LEFT HEART CATHETERIZATION WITH CORONARY ANGIOGRAM;  Surgeon: Jettie Booze, MD;  Location: [facility];  Service: Cardiovascular;  Laterality: N/A;  . LUMBAR [place] SURGERY  2001  . SHOULDER SURGERY Right    right fx  . TONSILLECTOMY    . TRANSTHORACIC ECHOCARDIOGRAM  03/2016   EF 55-60%, grade I DD, LAE  . WRIST SURGERY Right    right fx    Outpatient Medications Prior to Visit  Medication Sig Dispense Refill  . aspirin 81 MG tablet Take 81 mg by mouth daily.      Marland Kitchen atorvastatin (LIPITOR) 80 MG tablet TAKE 1 TABLET BY MOUTH ONCE DAILY 90 tablet 3  . baclofen (LIORESAL) 10 MG tablet 1/2 - 1 tab po q8h prn muscle spasms 30 each 1  . beta carotene w/minerals (OCUVITE) tablet Take 1 tablet by mouth daily.    . cetirizine (ZYRTEC) 10 MG  tablet Take 10 mg by mouth daily.    . citalopram (CELEXA) 20 MG tablet TAKE 1 TABLET BY MOUTH ONCE DAILY 90 tablet 1  . clonazePAM (KLONOPIN) 1 MG tablet TAKE 1 TABLET BY MOUTH TWICE DAILY AS NEEDED FOR ANXIETY 60 tablet 5  . clotrimazole-betamethasone (LOTRISONE) cream Apply to affected area twice daily 45 g 2  . furosemide (LASIX) 20 MG tablet Take 1 tablet (20 mg total) daily by mouth. 30 tablet 1  . gabapentin (NEURONTIN) 300 MG capsule TAKE ONE CAPSULE BY MOUTH THREE TIMES DAILY 270 capsule 3  . hydrOXYzine (ATARAX/VISTARIL) 25 MG tablet TAKE 1 TABLET BY MOUTH AT SUPPER AND 1 TAB AT BEDTIME FOR INSOMNIA 60 tablet 6  . ibuprofen (ADVIL,MOTRIN) 200 MG tablet  Take 400 mg by mouth every 6 (six) hours as needed.    . Insulin Lispro (HUMALOG KWIKPEN) 200 UNIT/ML SOPN Inject 20 Units into the skin 2 (two) times daily with a meal. (Patient taking differently: Inject 16 Units into the skin 2 (two) times daily with a meal. Takes  16 units  Twice  Daily.) 18 pen 6  . Insulin NPH, Human,, Isophane, (HUMULIN N) 100 UNIT/ML Kiwkpen 16 units every morning and 16 units every evening 15 mL 3  . magnesium chloride (SLOW-MAG) 64 MG TBEC SR tablet Take 1 tablet (64 mg total) by mouth daily. 60 tablet 0  . metFORMIN (GLUCOPHAGE) 1000 MG tablet TAKE 1 TABLET BY MOUTH TWICE DAILY WITH MEALS 180 tablet 1  . Omega-3 Fatty Acids ([employer] FISH OIL) 1000 MG CAPS Take 2 tablets by mouth 2 (two) times daily. 90 capsule   . ONE TOUCH ULTRA TEST test strip USE  STRIP TO CHECK GLUCOSE THREE TIMES DAILY AS  DIRECTED 100 each 11  . ONETOUCH DELICA LANCETS 33G MISC USE ONE  TO CHECK GLUCOSE THREE TIMES DAILY 100 each 11  . oxyCODONE (OXY IR/ROXICODONE) 5 MG immediate release tablet Take 1 tablet (5 mg total) by mouth every 8 (eight) hours as needed for severe pain. 30 tablet 0  . OXYGEN Inhale 2 L/min into the lungs continuous.    . pantoprazole (PROTONIX) 40 MG tablet Take 1 tablet (40 mg total) by mouth 2 (two) times daily. 60  tablet 6  . ezetimibe (ZETIA) 10 MG tablet Take 1 tablet (10 mg total) by mouth daily. 90 tablet 3   Facility-Administered Medications Prior to Visit  Medication Dose Route Frequency Provider Last Rate Last Dose  . sodium chloride flush (NS) 0.9 % injection 10 mL  10 mL Intravenous PRN Feng, Yan, MD   10 mL at 04/16/17 1459  . sodium chloride flush (NS) 0.9 % injection 10 mL  10 mL Intravenous PRN Feng, Yan, MD   10 mL at 05/02/17 1725    No Known Allergies  ROS As per HPI  PE: Blood pressure 135/80, pulse 84, temperature 97.6 F (36.4 C), temperature source Oral, resp. rate 16, weight 289 lb (131.1 kg), SpO2 97 %. Oxygen 2 L 24/7. Gen: Alert, well appearing.  Patient is oriented to person, place, time, and situation. AFFECT: pleasant, lucid thought and speech. No further exam today.  LABS:  Lab Results  Component Value Date   TSH 2.940 08/28/2016   Lab Results  Component Value Date   WBC 5.5 10/03/2017   HGB 12.1 (L) 10/03/2017   HCT 39.0 10/03/2017   MCV 94.4 10/03/2017   PLT 107 (L) 10/03/2017   Lab Results  Component Value Date   CREATININE 1.2 10/03/2017   BUN 13.9 10/03/2017   NA 136 10/03/2017   K 3.8 10/03/2017   CL 97 11/28/2016   CO2 29 10/03/2017   Lab Results  Component Value Date   ALT 50 10/03/2017   AST 64 (H) 10/03/2017   ALKPHOS 68 10/03/2017   BILITOT 0.77 10/03/2017   Lab Results  Component Value Date   CHOL 96 (L) 11/28/2016   Lab Results  Component Value Date   HDL 37 (L) 11/28/2016   Lab Results  Component Value Date   LDLCALC 25 11/28/2016   Lab Results  Component Value Date   TRIG 168 (H) 11/28/2016   Lab Results  Component Value Date   CHOLHDL 2.6 11/28/2016   Lab Results  Component Value   Date   HGBA1C 6.8 10/25/2017   POC HbA1c today= 6.8%.  IMPRESSION AND PLAN:   1) DM 2:  POC HbA1c today= 6.8%.  No changes in treatment regimen today. Due for diab retpthy screening exam Urine micro/cr and feet exam  UTD.   2) GAD: doing well on citalopram 72m qd and clonaz 166mbid scheduled dosing. Controlled substance contract reviewed with pt and signed and put in chart today. CBC and CMET stable 10/05/17.  An After Visit Summary was printed and given to the patient.  FOLLOW UP: Return in about 3 months (around 01/23/2018) for routine chronic illness f/u.  Signed:  PhCrissie SicklesMD           10/25/2017

## 2017-10-28 ENCOUNTER — Telehealth: Payer: Self-pay | Admitting: *Deleted

## 2017-10-28 ENCOUNTER — Telehealth: Payer: Self-pay | Admitting: Hematology

## 2017-10-28 NOTE — Telephone Encounter (Signed)
Scheduled appt per 12/31 sch msg - spoke with patient regarding appts.  °

## 2017-10-28 NOTE — Telephone Encounter (Signed)
-----   Message from Truitt Merle, MD sent at 10/26/2017  1:20 PM EST ----- Please let pt know the CXR result. Due to my busy schedule on 1/3, I will reschedule his appointments on 1/3 to one week after. Thanks  Truitt Merle  10/26/2017

## 2017-10-29 IMAGING — RF DG ESOPHAGUS
9 of 13 series · 15 of 21 positions shown · non-contrast
Comparison: No priors.

CLINICAL DATA: 69-year-old male with history of dysphagia.

EXAM:
ESOPHOGRAM / BARIUM SWALLOW / BARIUM TABLET STUDY
TECHNIQUE: Combined double contrast and single contrast examination performed
using effervescent crystals, thick barium liquid, and thin barium
liquid. The patient was observed with fluoroscopy swallowing a 13 mm
barium sulphate tablet.
FLUOROSCOPY TIME:  Radiation Exposure Index (as provided by the
fluoroscopic device): 58 mGy

[Series 1: fluoro_barium 2fps_bw · 0.17mm/px · 1 of 1 slices shown (1 of 4)]
[im 1/1]
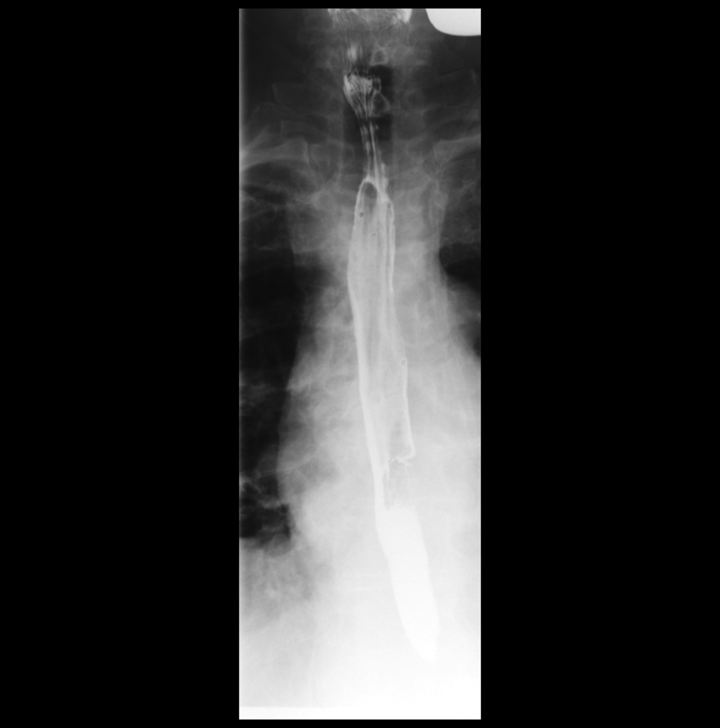

[Series 3: fluoro_barium 2fps_bw · 0.17mm/px · 1 of 1 slices shown (2 of 4)]
[im 1/1]
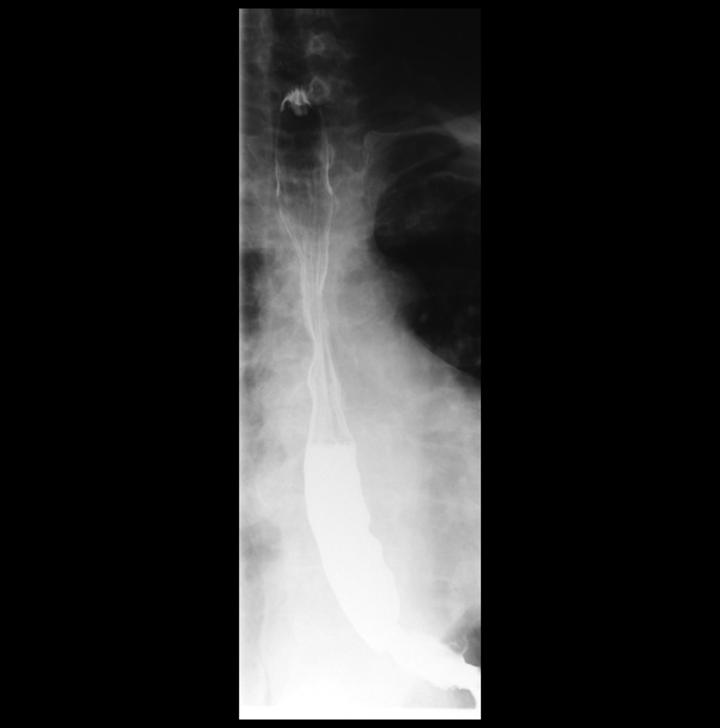

[Series 4: cp_standard · 0.51mm/px · 3 of 69 frames shown (1 of 5)]
[frame 11/69]
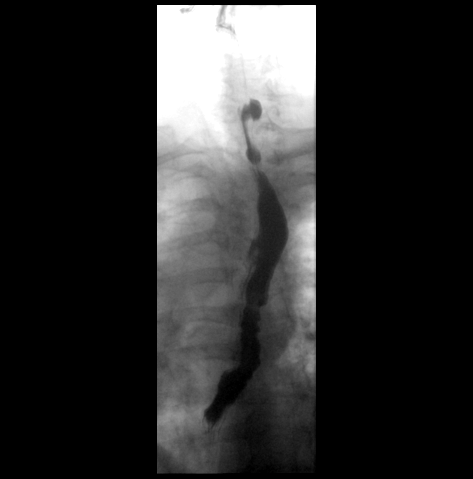
[frame 35/69]
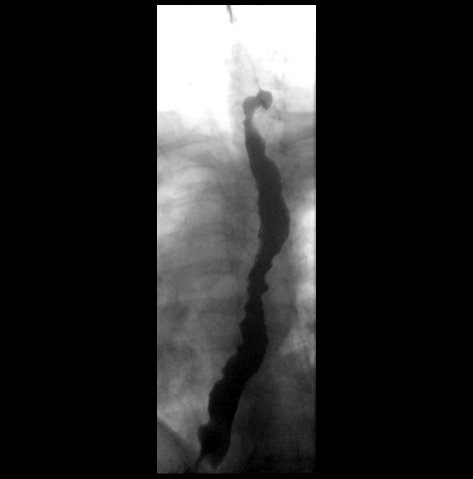
[frame 60/69]
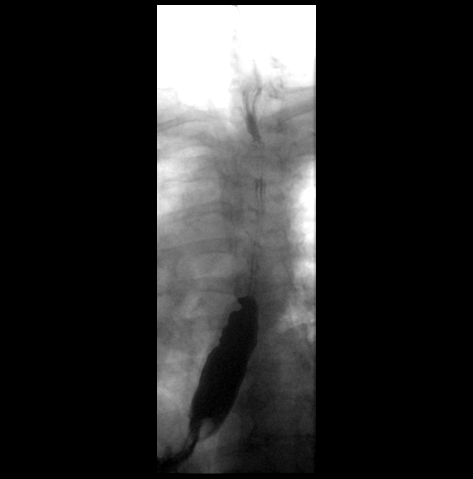

[Series 5: cp_standard · 0.51mm/px · 3 of 48 frames shown (2 of 5)]
[frame 6/48]
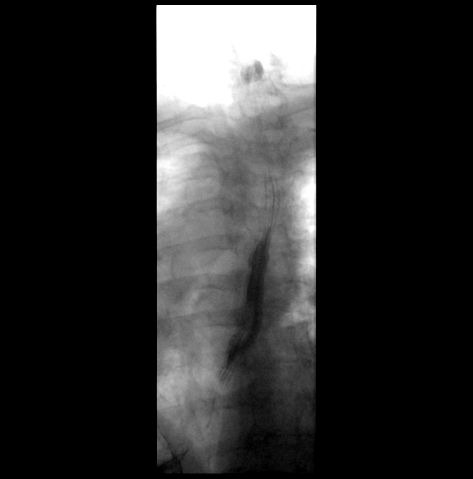
[frame 25/48]
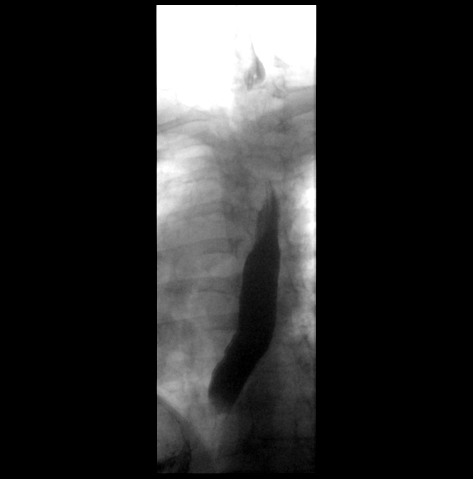
[frame 41/48]
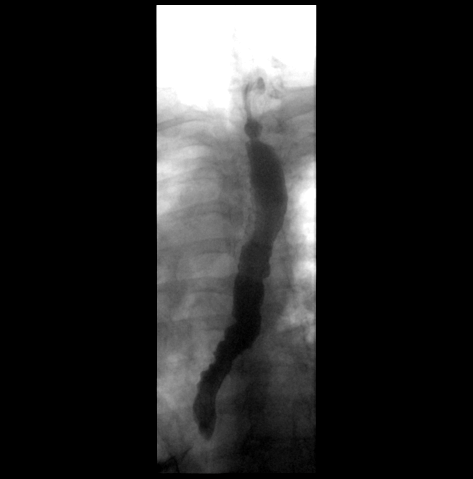

[Series 6: fluoro_barium 2fps_bw · 0.17mm/px · 1 of 1 slices shown (3 of 4)]
[im 1/1]
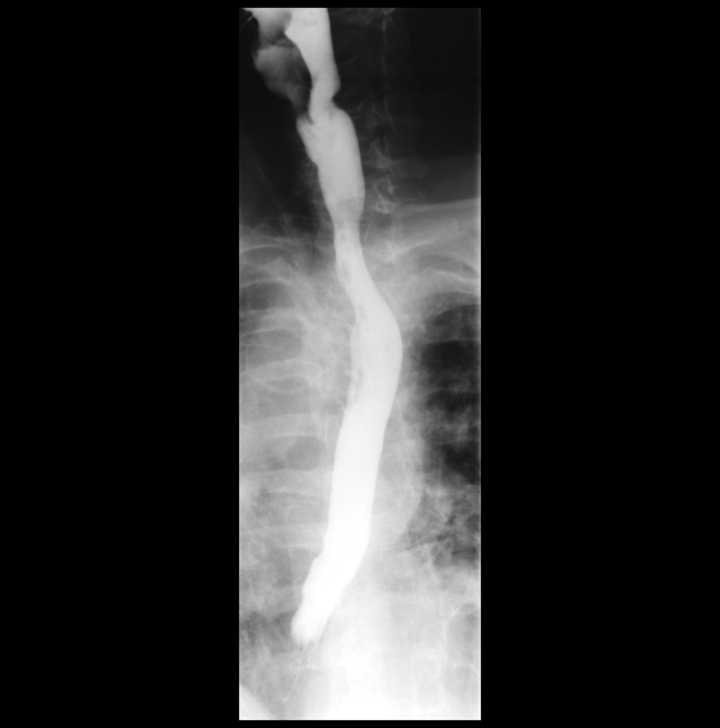

[Series 8: fluoro_barium 2fps_bw · 0.18mm/px · 1 of 1 slices shown (4 of 4)]
[im 1/1]
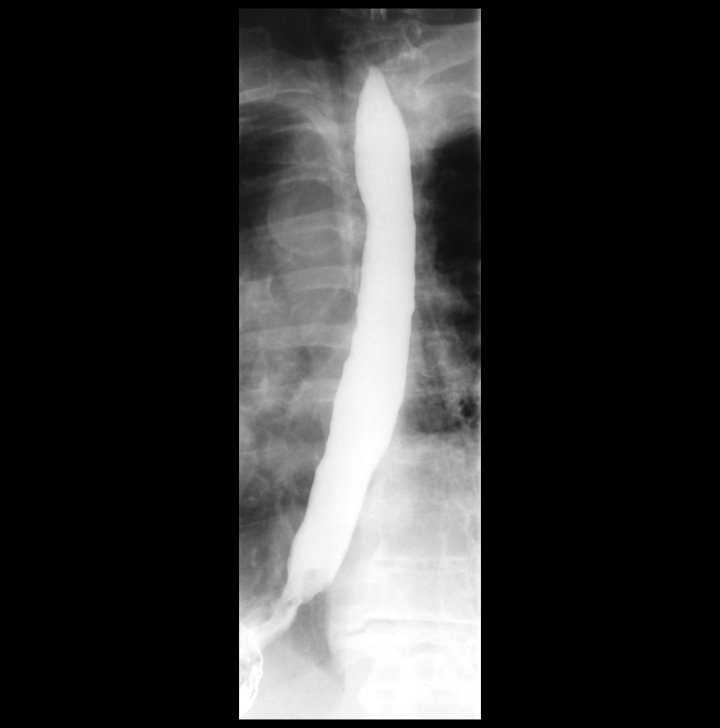

[Series 9: cp_standard · 0.26mm/px · 1 of 1 slices shown (3 of 5)]
[im 1/1]
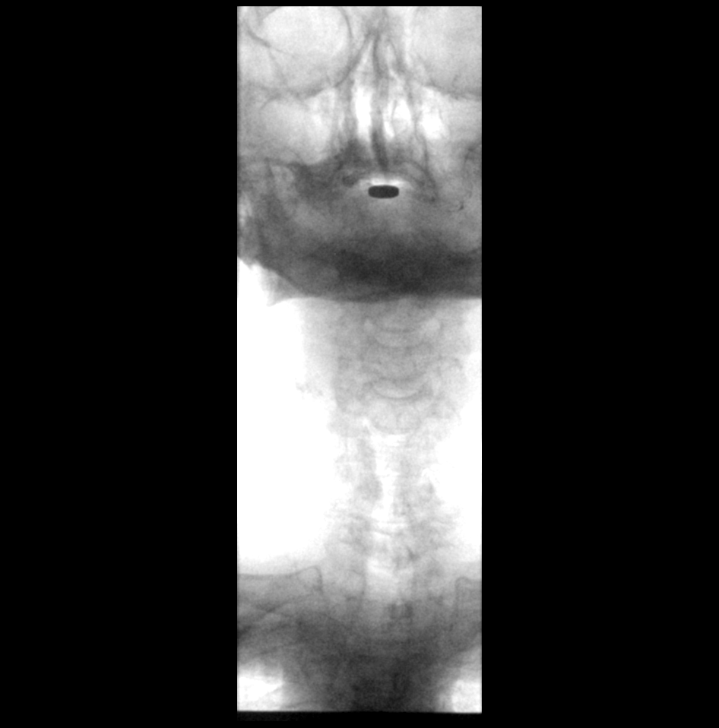

[Series 11: cp_standard · 0.52mm/px · 3 of 35 frames shown (4 of 5)]
[frame 6/35]
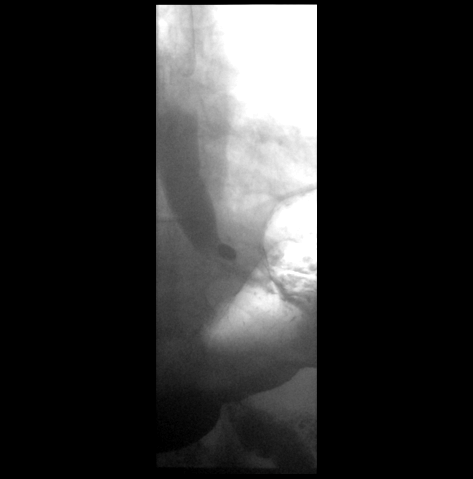
[frame 18/35]
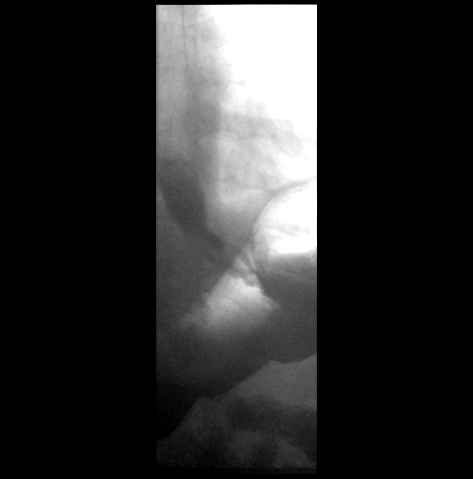
[frame 30/35]
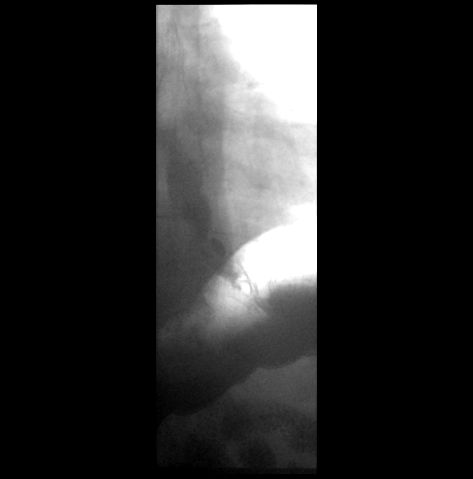

[Series 13: cp_standard · 0.26mm/px · 1 of 1 slices shown (5 of 5)]
[im 1/1]
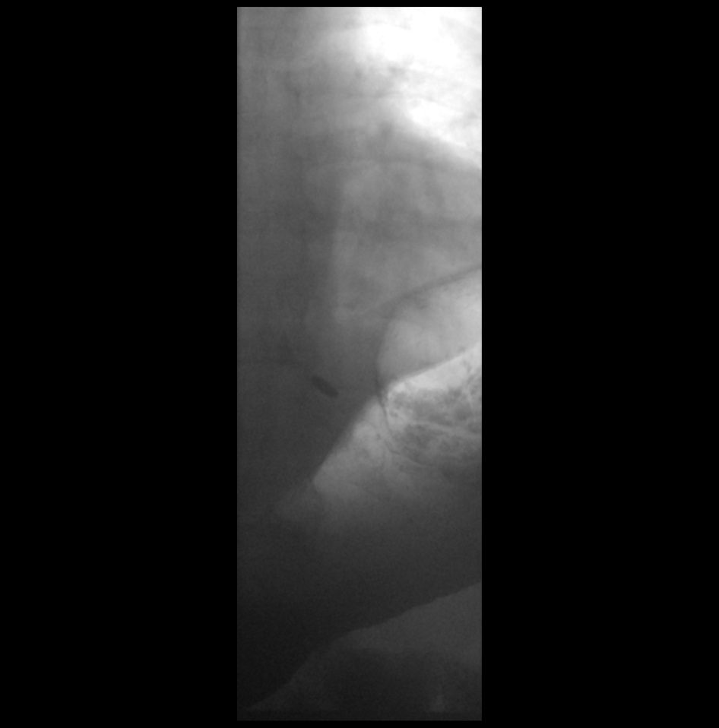

[15 of 21 positions shown; findings below may reference images not displayed]

FINDINGS: Initial double contrast images of the esophagus demonstrated a
normal appearance of the esophageal mucosa. Multiple single swallow
attempts were observed, which demonstrated failure to propagate the
normal primary peristaltic wave, and demonstrated numerous tertiary
contractions. Full column esophagram demonstrated no esophageal mass
or esophageal ring. However, the distal esophagus immediately above
the gastroesophageal junction appeared narrowed throughout the
examination. No hiatal hernia. No gastroesophageal reflux was noted
during water siphon test. A barium tablet was administered, which
failed passed beyond the area of apparent narrowing in the distal
esophagus.
IMPRESSION: 1. Distal esophageal stricture. Correlation with endoscopy is
recommended.
2. Nonspecific esophageal motility disorder with extensive tertiary
contractions.

## 2017-10-31 ENCOUNTER — Other Ambulatory Visit: Payer: Medicare Other

## 2017-10-31 ENCOUNTER — Ambulatory Visit: Payer: Medicare Other | Admitting: Hematology

## 2017-11-01 DIAGNOSIS — S04042A Injury of visual cortex, left eye, initial encounter: Secondary | ICD-10-CM | POA: Diagnosis not present

## 2017-11-01 DIAGNOSIS — H11153 Pinguecula, bilateral: Secondary | ICD-10-CM | POA: Diagnosis not present

## 2017-11-01 LAB — HM DIABETES EYE EXAM

## 2017-11-04 ENCOUNTER — Encounter: Payer: Self-pay | Admitting: Family Medicine

## 2017-11-05 ENCOUNTER — Encounter: Payer: Self-pay | Admitting: Family Medicine

## 2017-11-05 ENCOUNTER — Other Ambulatory Visit: Payer: Self-pay | Admitting: Family Medicine

## 2017-11-07 DIAGNOSIS — J449 Chronic obstructive pulmonary disease, unspecified: Secondary | ICD-10-CM | POA: Diagnosis not present

## 2017-11-11 NOTE — Progress Notes (Signed)
[place]  [phone] [phone]  Clinic Follow up Note   Patient Care Team: Tammi Sou, MD as PCP - General (Family Medicine) Harold Hedge, Darrick Grinder, MD as Consulting Physician (Allergy and Immunology) Shirley Muscat Loreen Freud, MD as Consulting Physician (Optometry) Sueanne Margarita, MD as Consulting Physician (Cardiology) Pyrtle, Lajuan Lines, MD as Consulting Physician (Gastroenterology) Latanya Maudlin, MD as Consulting Physician (Orthopedic Surgery) Jodi Marble, MD as Consulting Physician (Otolaryngology) Truitt Merle, MD as Consulting Physician (Hematology) Kyung Rudd, MD as Consulting Physician (Radiation Oncology)   Date of Service: 11/12/2017     CHIEF COMPLAINTS:  Follow up GE junction cancer  Oncology History   Cancer of cardio-esophageal junction [facility])   Staging form: Stomach, AJCC 7th Edition     Clinical stage from 05/04/2016: Stage IV (TX, N2, M1) - Signed by Truitt Merle, MD on 05/18/2016        Cancer of cardio-esophageal junction ([place])   05/04/2016 Initial Diagnosis    Cancer of cardio-esophageal junction ([place])      05/04/2016 Procedure    EGD showed a large ulcerating mass with no active bleeding at the gastroesophageal junction extending into the gastric cardia. The mass was not obstructing and partially circumferential, extends approximately 5 cm. Nonbleeding erosive gastropathy.      05/04/2016 Initial Biopsy    Esophageal gastric junction biopsy showed a poorly differentiated carcinoma underlying the squamous mucosa. There is lymphovascular invasion. No Intestinal metaplasia. IHC weakly positive CK5/6, p63 (-), favor squamous.        05/09/2016 Imaging    CT CAP w contrast showed mild wall thickening involving the distal esophagus and proximal stomach compatible with known cancer, enlarged mediastinal and upper abdominal lymph nodes are highly suspicious for metastatic adenopathy, propable liver cirrhosis.      06/04/2016 -  06/22/2016 Radiation Therapy    palliative radiation to esophageal cancer       07/06/2016 - 06/20/2017 Chemotherapy    chemotherapy with weekly carboplatin and taxol, started on 07/06/2016, held 08/21/16-09/28/2016 due to hospitalization, changed to every 2 weeks from 11/30/2016. Chemo stopped due to no evidence of disease on restaging scan.      07/30/2016 Pathology Results    PD-L1 negative expression      08/28/2016 - 08/31/2016 Hospital Admission    The patient was admitted to 4 severe hypoglycemia, dehydration, and acute renal failure. Infection workup was negative. He recovered well with supportive care. His insulin and hypertension medication was held on discharge, except insulin sliding scale.      09/24/2016 Imaging    CT CAP W CONTRAST 09/26/2016 IMPRESSION: Decreased masslike soft tissue prominence at gastroesophageal junction, consistent with decreased size of primary gastroesophageal junction carcinoma. Resolution of paraesophageal and gastrohepatic ligament lymphadenopathy since prior exam. Other sub-cm mediastinal lymph nodes show little or no significant change. Stable indeterminate sub-cm low-attenuation lesion in the liver dome. Probable hepatic cirrhosis. Recommend continued attention on follow-up CT. No new or progressive metastatic disease identified. Cholelithiasis.  No radiographic evidence of cholecystitis. Colonic diverticulosis. No radiographic evidence of diverticulitis. Aortic atherosclerosis and three-vessel coronary artery calcification.      01/07/2017 Imaging    CT CAP w contrast 1. No residual esophageal mass or adenopathy. 2. New nodular airspace opacification in the left lower lobe. Continued attention on followup exams is warranted as metastatic disease cannot be definitively excluded. 3. Cirrhosis. 4. Trace left pleural effusion. 5. Aortic atherosclerosis (ICD10-170.0). Coronary artery calcification. 6. Cholelithiasis. 7. Mild basilar predominant  subpleural  reticular densities. Difficult to exclude interstitial lung disease.      04/29/2017 Imaging    CT CAP w contrast  IMPRESSION: 1. No evidence of metastatic disease. 2. Nodular airspace disease previously seen in the left lower lobe has resolved in the interval. 3. Tiny right pleural effusion, new. Small left pleural effusion, slightly increased. 4. Cirrhosis with splenomegaly. 5.  Aortic atherosclerosis (ICD10-170.0).      09/05/2017 Imaging    CT CAP W Contrast 09/05/17  IMPRESSION: 1. Currently no esophageal mass is visible, nor is there significant mediastinal adenopathy. There is hepatic cirrhosis but no compelling findings of hepatic metastatic disease. 2. Large right pleural effusion is increased from the prior exam. Small stable left pleural effusion. 3. Small but increased amount of ascites and mesenteric edema compared to prior. 4. Other imaging findings of potential clinical significance: Aortic Atherosclerosis (ICD10-I70.0). Coronary atherosclerosis. The markedly severe right glenohumeral arthropathy. Thoracic and lumbar spondylosis with lower lumbar multilevel impingement, and notable intervertebral spurring at T12-L1. Cholelithiasis. Degenerative arthropathy of both hips.       10/21/2017 Imaging    Chest X-Ray IMPRESSION: Small bilateral effusions, decreased on the right since prior CT.       HISTORY OF PRESENTING ILLNESS:  [name] 72 y.o. male is here because of His reason that diagnosed GE junction carcinoma. He is a comment of by his wife and daughter to our multidisciplinary chart clinic today.  He has been having progressive dysphagia and odynophagia for 2 month, he has been eating soft diet only in the past one week. He has pain in the mid chest and epigastric area only when he eats, with burning sensation, no nausea, abdominal bloating, or other discomfort. His appetite has remained well, no weight loss,   He has multiple medical  problems, especially heart failure, he has been on oxygen continuously for 5-6 years. He is morbidly obese, has diabetes, hypertension, mild neuropathy, OSA etc. He has very sedentary life style, he sits in the chair and watch TV most of time during the day. He only comes out for shopping for a few times a week. He uses a [employer], he can walk for a few hundreds feet before he has to stop to catch his breasts. He denies cough or sputum production, no GI discomfort, he has been having mild diarrhea, loose stool, twice daily, no melena or hematochezia.  CURRENT THERAPY: Observation   INTERIM HISTORY:  CHILD CAMPOY returns for follow-up. He presents to the clinic on continuous O2. He reports that he is doing well overall. He has no complaints. He reports that he is breathing better since starting Lasix, he notes that he is sleeping later in the morning and only has to use one pillow. He was on 40 mg Lasix and it was recently reduced to 20 mg but he does not know who prescribed this. He endorses a good appetite. Pt states that he has had continuing dizziness and lightheadedness for the past year. He does not feel dizzy when seated, but does experience orthostatic hypotension. Pt states that he has not consumed alcohol in 30 years.    On review of systems, pt denies abdominal pain, nausea, new leg swelling, vomiting or any other concerns at this time. Pertinent positives are listed and detailed within the above HPI.    MEDICAL HISTORY:  Past Medical History:  Diagnosis Date  . Asthma    as a child  . CAD in native artery  a. cardiac cath 09/2013 showed severely diseased mLAD and diagonal, mild LCx disease, moderate RCA disease, LVEDP 20, EF 50%.  . Cataract    multiple types, bilateral  . Cholelithiasis without cholecystitis   . Chronic diastolic CHF (congestive heart failure) ([place]) 02/25/2009  . Chronic renal insufficiency, stage III (moderate) (HCC) 2017   Stage II/III (GFR around  60)  . 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 COPD (chronic obstructive pulmonary disease) ([place])   . Coronary artery disease    chronically occluded LAD and diagonal with right to left collaterals, mild disease in the left circ and moderate disease in the mid RCA on medical management with Imdur, ASA, and Plavix.  . Dermatitis 05/2014  . DIABETES MELLITUS, TYPE II 06/24/2007   No diab retpthy as of 08/05/15 eye exam.  . 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 Pyrtle--EGD).  CT C/A/P showed metastatic adenopathy in mediastinum and upper abdomen 05/09/16.  Tx plan is palliative radiation (completed 06/22/16), then palliative systemic chemotherapy (carbo+taxol) was started but as of 08/03/16 onc f/u this was held due to severe knee and ankle arthralgias.  Restarting as of 10/2015 (08/2016 CT showed some disease regression  . Esophageal cancer ([place])    CT 12/2016 showed no residual esoph mass--plan to continue chemo.  Ongoing palliative chemo with carboplatin and taxol q 2 wks as of 03/2017 onc f/u (CT 12/2016 and 04/2017 showed no progression of dz).  Taking a 2 mo break from chemo as of 05/2017.  . Essential hypertension 05/01/2007   Qualifier: Diagnosis of  By: Tiney Rouge CMA, Ellison Hughs     . History of kidney stones   . Hyperkalemia 12/2015   Decreased ACE-I by 50% in response, then potassium normalized.  Marland Kitchen HYPERLIPIDEMIA 06/24/2007  . Hypoxemia 01/27/2010  . Morbid obesity ([place])   . Myocardial infarction [facility])    pt states he was informed per MD that he has had one but pt was unaware   . Obesity hypoventilation syndrome (HCC)    oxygen 24/7 (2 liters [place] as of 02/2016)  . On home oxygen therapy    Oxygen @ 2l/m nasally 24/7 hours  . OSA (obstructive sleep apnea)    not tested; pt scored 4 per stop bang tool results sent to PCP   . OSTEOARTHRITIS 05/01/2007  .  Pancytopenia due to chemotherapy ([place]) 2018  . Pleural effusion on right 08/2017   ? malignant vs CHF: pt refuses diagnostic thoracentesis b/c he states he feels fine, wants to try diuretic first.  . Pleural effusion on right 09/2017   Thoracentesis  . Presbycusis of both ears 05/2015   [facility]  . Pruritic condition 05/2014   Allergist summer 2015, no new testing.  Marland Kitchen RBBB   . Visual field defect 05/10/2016   Per Dr. Melina Fiddler, O.D.: Rt, loss of inf/temp quad and some loss sup/temp.  ?CVA  ? Pituitary tumor? 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    SURGICAL HISTORY: Past Surgical History:  Procedure Laterality Date  . APPENDECTOMY    . BALLOON DILATION N/A 05/04/2016   Procedure: BALLOON DILATION;  Surgeon: Jerene Bears, MD;  Location: [facility];  Service: Gastroenterology;  Laterality: N/A;  . CARDIAC CATHETERIZATION    . CARPAL TUNNEL RELEASE Left   . CATARACT EXTRACTION W/PHACO  Right 04/29/2013   Procedure: CATARACT EXTRACTION PHACO AND INTRAOCULAR LENS PLACEMENT (IOC);  Surgeon: Adonis Brook, MD;  Location: [facility];  Service: Ophthalmology;  Laterality: Right;  . CATARACT EXTRACTION, BILATERAL    . COLONOSCOPY W/ POLYPECTOMY  08/2011   Many polyps--all hyperplastic, severe diverticulosis, int hem.  [employer] hemoccult testing via [facility] 06/22/15 NEG  . ESOPHAGOGASTRODUODENOSCOPY N/A 05/04/2016   Procedure: ESOPHAGOGASTRODUODENOSCOPY (EGD);  Surgeon: Jerene Bears, MD;  Location: [facility];  Service: Gastroenterology;  Laterality: N/A;  . IR GENERIC HISTORICAL  10/03/2016   IR US GUIDE VASC ACCESS RIGHT 10/03/2016 Greggory Keen, MD [facility]  . IR GENERIC HISTORICAL  10/03/2016   IR FLUORO GUIDE PORT INSERTION RIGHT 10/03/2016 Greggory Keen, MD [facility]  . KNEE ARTHROSCOPY WITH MEDIAL MENISECTOMY Right 12/16/2014   Procedure: RIGHT KNEE ARTHROSCOPY WITH MEDIAL MENISECTOMY microfracture medial femoral condyle abrasion condroplasty medial femoral  condyle lateral menisectomy;  Surgeon: Tobi Bastos, MD;  Location: [facility];  Service: Orthopedics;  Laterality: Right;  . LEFT HEART CATHETERIZATION WITH CORONARY ANGIOGRAM N/A 10/26/2013   Procedure: LEFT HEART CATHETERIZATION WITH CORONARY ANGIOGRAM;  Surgeon: Jettie Booze, MD;  Location: [facility] C[facility] LAB;  Service: Cardiovascular;  Laterality: N/A;  . LUMBAR [place] SURGERY  2001  . SHOULDER SURGERY Right    right fx  . TONSILLECTOMY    . TRANSTHORACIC ECHOCARDIOGRAM  03/2016   EF 55-60%, grade I DD, LAE  . WRIST SURGERY Right    right fx    SOCIAL HISTORY: Social History   Socioeconomic History  . Marital status: Married    Spouse name: susan  . Number of children: 2  . Years of education: Not on file  . Highest education level: Not on file  Social Needs  . Financial resource strain: Not on file  . Food insecurity - worry: Not on file  . Food insecurity - inability: Not on file  . Transportation needs - medical: Not on file  . Transportation needs - non-medical: Not on file  Occupational History  . Occupation: Retired  Tobacco Use  . Smoking status: Former Smoker    Packs/day: 1.50    Years: 30.00    Pack years: 45.00    Types: Cigarettes, Pipe, Cigars    Last attempt to quit: 10/30/1983    Years since quitting: 34.0  . Smokeless tobacco: Former Systems developer    Types: [place] date: 05/15/2016  Substance and Sexual Activity  . Alcohol use: No  . Drug use: No  . Sexual activity: Not Currently  Other Topics Concern  . Not on file  Social History Narrative   Married, one son and one daughter.   His daughter and her two children live with him.   Coffee daily.  Former smoker.  No alcohol.   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   Attends church weekly-   Oxygen continuous    FAMILY HISTORY: Family History  Problem Relation Age of Onset  . Heart attack Mother   . Aneurysm Father   . Alcohol abuse Father   . Diabetes Maternal Aunt     . Colon cancer Neg Hx     ALLERGIES:  has No Known Allergies.  MEDICATIONS:  Current Outpatient Medications  Medication Sig Dispense Refill  . aspirin 81 MG tablet Take 81 mg by mouth daily.      Marland Kitchen atorvastatin (LIPITOR) 80 MG tablet TAKE 1 TABLET BY MOUTH ONCE DAILY 90  tablet 3  . baclofen (LIORESAL) 10 MG tablet 1/2 - 1 tab po q8h prn muscle spasms 30 each 1  . beta carotene w/minerals (OCUVITE) tablet Take 1 tablet by mouth daily.    . cetirizine (ZYRTEC) 10 MG tablet Take 10 mg by mouth daily.    . citalopram (CELEXA) 20 MG tablet TAKE 1 TABLET BY MOUTH ONCE DAILY 90 tablet 1  . clonazePAM (KLONOPIN) 1 MG tablet TAKE 1 TABLET BY MOUTH TWICE DAILY AS NEEDED FOR ANXIETY 60 tablet 5  . clotrimazole-betamethasone (LOTRISONE) cream Apply to affected area twice daily 45 g 2  . ezetimibe (ZETIA) 10 MG tablet Take 1 tablet (10 mg total) by mouth daily. 90 tablet 3  . gabapentin (NEURONTIN) 300 MG capsule TAKE ONE CAPSULE BY MOUTH THREE TIMES DAILY 270 capsule 3  . HUMULIN N KWIKPEN 100 UNIT/ML Kiwkpen INJECT 16 UNITS EVERY MORNING AND 16 UNITS EVERY EVENING 15 mL 3  . hydrOXYzine (ATARAX/VISTARIL) 25 MG tablet TAKE 1 TABLET BY MOUTH AT SUPPER AND 1 TAB AT BEDTIME FOR INSOMNIA 60 tablet 6  . ibuprofen (ADVIL,MOTRIN) 200 MG tablet Take 400 mg by mouth every 6 (six) hours as needed.    . Insulin Lispro (HUMALOG KWIKPEN) 200 UNIT/ML SOPN Inject 20 Units into the skin 2 (two) times daily with a meal. (Patient taking differently: Inject 16 Units into the skin 2 (two) times daily with a meal. Takes  16 units  Twice  Daily.) 18 pen 6  . Insulin NPH, Human,, Isophane, (HUMULIN N) 100 UNIT/ML Kiwkpen 16 units every morning and 16 units every evening 15 mL 3  . magnesium chloride (SLOW-MAG) 64 MG TBEC SR tablet Take 1 tablet (64 mg total) by mouth daily. 60 tablet 0  . metFORMIN (GLUCOPHAGE) 1000 MG tablet TAKE 1 TABLET BY MOUTH TWICE DAILY WITH MEALS 180 tablet 1  . Omega-3 Fatty Acids ([employer] FISH OIL)  1000 MG CAPS Take 2 tablets by mouth 2 (two) times daily. 90 capsule   . ONE TOUCH ULTRA TEST test strip USE  STRIP TO CHECK GLUCOSE THREE TIMES DAILY AS  DIRECTED 100 each 11  . ONETOUCH DELICA LANCETS 48N MISC USE ONE  TO CHECK GLUCOSE THREE TIMES DAILY 100 each 11  . oxyCODONE (OXY IR/ROXICODONE) 5 MG immediate release tablet Take 1 tablet (5 mg total) by mouth every 8 (eight) hours as needed for severe pain. 30 tablet 0  . OXYGEN Inhale 2 L/min into the lungs continuous.    . pantoprazole (PROTONIX) 40 MG tablet Take 1 tablet (40 mg total) by mouth 2 (two) times daily. 60 tablet 6  . cromolyn (OPTICROM) 4 % ophthalmic solution      No current facility-administered medications for this visit.    Facility-Administered Medications Ordered in Other Visits  Medication Dose Route Frequency Provider Last Rate Last Dose  . sodium chloride flush (NS) 0.9 % injection 10 mL  10 mL Intravenous PRN Truitt Merle, MD   10 mL at 04/16/17 1459  . sodium chloride flush (NS) 0.9 % injection 10 mL  10 mL Intravenous PRN Truitt Merle, MD   10 mL at 05/02/17 1725    REVIEW OF SYSTEMS:  Constitutional: Denies fevers, abnormal night sweats  Eyes: Denies blurriness of vision, double vision or watery eyes Ears, nose, mouth, throat, and face: Denies mucositis or sore throat, Respiratory: Denies cough, wheezes (+) dyspnea upon laying down improved Cardiovascular: Denies palpitation, chest discomfort or lower extremity swelling Gastrointestinal:  Denies nausea, heartburn or change  in bowel habits  Skin: Denies abnormal skin rashes  Lymphatics: Denies new lymphadenopathy or easy bruising Neurological:Denies numbness, tingling (+) dizziness  Behavioral/Psych: Mood is stable, no new changes  MSK: (+) arthritis  All other systems were reviewed with the patient and are negative.  PHYSICAL EXAMINATION:  ECOG PERFORMANCE STATUS: 3 - Symptomatic, >50% confined to bed BP (!) 98/59 (BP Location: Right Arm, Patient Position:  Sitting)   Pulse 79   Temp 97.8 F (36.6 C) (Oral)   Resp 20   Wt 287 lb 8 oz (130.4 kg)   SpO2 98%   BMI 43.71 kg/m   GENERAL:alert, no distress and comfortable, morbid obese, sitting in wheelchair with nasal cannula oxygen  SKIN: skin color, texture, turgor are normal, no rashes or significant lesions EYES: normal, conjunctiva are pink and non-injected, sclera clear OROPHARYNX:no exudate, no erythema and lips, buccal mucosa, and tongue normal  NECK: supple, thyroid normal size, non-tender, without nodularity LYMPH:  no palpable lymphadenopathy in the cervical, axillary or inguinal LUNGS: (+) R decreased in breath sounds in the bottom of right lung, less than last visit  HEART: regular rate & rhythm and no murmurs and no lower extremity edema ABDOMEN:abdomen soft, non-tender and normal bowel sounds Musculoskeletal:no cyanosis of digits and no clubbing  PSYCH: alert & oriented x 3 with fluent speech NEURO: no focal motor/sensory deficits EXT: Trace edema, he wears compression stocks, no significant ankle swollen, erythema, or warmness.   LABORATORY DATA:  I have reviewed the data as listed CBC Latest Ref Rng & Units 11/12/2017 10/03/2017 09/20/2017  WBC 4.0 - 10.3 K/uL 5.1 5.5 4.9  Hemoglobin 13.0 - 17.1 g/dL 11.8(L) 12.1(L) 11.8(L)  Hematocrit 38.4 - 49.9 % 38.4 39.0 36.6(L)  Platelets 140 - 400 K/uL 96(L) 107(L) 129(L)   CMP Latest Ref Rng & Units 11/12/2017 10/03/2017 09/20/2017  Glucose 70 - 140 mg/dL 109 196(H) 162(H)  BUN 7 - 26 mg/dL 12 13.9 13.5  Creatinine 0.70 - 1.30 mg/dL 1.24 1.2 1.0  Sodium 136 - 145 mmol/L 139 136 138  Potassium 3.5 - 5.1 mmol/L 3.9 3.8 3.6  Chloride 98 - 109 mmol/L 98 - -  CO2 22 - 29 mmol/L 31(H) 29 30(H)  Calcium 8.4 - 10.4 mg/dL 9.2 9.3 9.1  Total Protein 6.4 - 8.3 g/dL 7.5 7.4 6.9  Total Bilirubin 0.2 - 1.2 mg/dL 0.8 0.77 0.67  Alkaline Phos 40 - 150 U/L 72 68 59  AST 5 - 34 U/L 162(H) 64(H) 77(H)  ALT 0 - 55 U/L 114(H) 50 52   PATHOLOGY  REPORT Diagnosis 05/04/2016 1. Esophagogastric junction, biopsy, ulcerative mass - POORLY DIFFERENTIATED CARCINOMA, SEE COMMENT. 2. Stomach, biopsy - REACTIVE GASTROPATHY. - NEGATIVE FOR HELICOBACTER PYLORI. - NO INTESTINAL METAPLASIA, DYSPLASIA, OR MALIGNANCY. Microscopic Comment 1. The majority of the specimen consists of gastroesophageal mucosa with reflux changes. There is a small focus of tumor underlying the squamous mucosa. There is lymphovascular invasion. There is no background intestinal metaplasia (Barrett's esophagus). Immunohistochemistry reveals only very focal weak cytokeratin 5/6, negative p63, and negative mucicarmine in the limited remaining tumor. Dr. Saralyn Pilar has reviewed the case. The case was called to Dr. Hilarie Fredrickson on 05/08/2016. 2. A Warthin-Starry stain is performed to determine the possibility of the presence of Helicobacter pylori. The Warthin-Starry stain is negative for organisms of Helicobacter pylori.     RADIOGRAPHIC STUDIES: I have personally reviewed the radiological images as listed and agreed with the findings in the report.  Chest X-Ray 10/21/17 IMPRESSION: Small bilateral  effusions, decreased on the right since prior CT.  CT CAP W Contrast 09/05/17  IMPRESSION: 1. Currently no esophageal mass is visible, nor is there significant mediastinal adenopathy. There is hepatic cirrhosis but no compelling findings of hepatic metastatic disease. 2. Large right pleural effusion is increased from the prior exam. Small stable left pleural effusion. 3. Small but increased amount of ascites and mesenteric edema compared to prior. 4. Other imaging findings of potential clinical significance: Aortic Atherosclerosis (ICD10-I70.0). Coronary atherosclerosis. The markedly severe right glenohumeral arthropathy. Thoracic and lumbar spondylosis with lower lumbar multilevel impingement, and notable intervertebral spurring at T12-L1. Cholelithiasis.  Degenerative arthropathy of both hips.   CT CAP w contrast 04/29/2017 IMPRESSION: 1. No evidence of metastatic disease. 2. Nodular airspace disease previously seen in the left lower lobe has resolved in the interval. 3. Tiny right pleural effusion, new. Small left pleural effusion, slightly increased. 4. Cirrhosis with splenomegaly. 5.  Aortic atherosclerosis (ICD10-170.0).   CT CAP w contrast 01/07/2017 IMPRESSION: 1. No residual esophageal mass or adenopathy. 2. New nodular airspace opacification in the left lower lobe. Continued attention on followup exams is warranted as metastatic disease cannot be definitively excluded. 3. Cirrhosis. 4. Trace left pleural effusion. 5. Aortic atherosclerosis (ICD10-170.0). Coronary artery calcification. 6. Cholelithiasis. 7. Mild basilar predominant subpleural reticular densities. Difficult to exclude interstitial lung disease.  EGD 05/04/2016 A large, ulcerating mass with no active bleeding and no stigmata of recent bleeding was found at the gastroesophageal junction extending into the gastric cardia, beginning 40 cm from the incisors. The mass was non-obstructing and partially circumferential (involving one-half of the lumen circumference). The mass extends approximately 5 cm. IMPRESSION:  -Likely malignant esophageal tumor was found at the gastroesophageal junction extending into the gastric cardia. Biopsied. - Non-bleeding erosive gastropathy. Biopsied. - Erythematous duodenopathy. - Normal second portion of the duodenum.  ASSESSMENT & PLAN: 72 y.o. Caucasian male, with multiple comorbidities, including hypertension, diabetes, OSA, coronary artery disease, diastolic CHF, on continuous oxygen, morbid obesity, presented with progressive dysphagia and odynophagia.  1. Cancer of cardio-esophageal junction, poorly differentiated, cTxN2M1 with node metastasis, stage IV  -I previously reviewed his CT scan, EGD, and the biopsy results with  patient and his family members in great details. -His biopsy pathology was previously reviewed in our tumor board, the morphology and weakly positive for CK 5/6, fevers squamous cell carcinoma -His CT scan findings are very concerning for distant metastasis to abdominal lymph nodes.  -I previously reviewed the PET scan images with patient and his daughter in person, he has hypermetabolic GE junction tumor, and diffuse adenopathy in mediastinum and upper abdomen. -We previously discussed the aggressive nature of esophageal cancer, an incurable nature of his disease due to the distant metastasis. The goal of therapy is palliative, to prolong his life and palliate his symptoms. -His tumor was negative for PD-L1, not a candidate for immunotherapy  -He is on first line palliative chemotherapy with weekly carbo and Taxol, every 2 weeks, tolerating low dose better lately. -I previously reviewed his CT scan findings on 09/26/2016 show good partial response to chemoradiation, no new or progressive metastatic disease. - I previously  reviewed his restaging CT scan from 01/07/2017,  Which showed no residual esophageal mass or adenopathy, he has mild new infiltrative change in LLL, possible related to aspiration, he is asymptomatic, will continue monitoring  -He is clinically doing well, tolerating chemotherapy well, lab results reviewed with patient, mild pancytopenia, secondary to chemotherapy, adequate for treatment, we'll continue low-dose carboplatin and  Taxol every 2 weeks. Due to the thrombocytopenia, I have decreased his carboplatin to AUC 1.5 previously. -I reviewed his restaging CT scan from 04/29/2017 which showed no evidence of primary or metastatic cancer. -He has been on chemotherapy since early September 2017, last few scans showed no evidence of disease, we discussed the option of holding chemotherapy for 2 months, and repeat scan, versus continued chemotherapy. After a lengthy discussion, patient  decided to stop chemotherapy after the next treatment for observation. We discussed the possibility of tumor progression after chemotherapy break. Since he has had excellent response (completed radiographic response), I think the reasonable to give him a chemotherapy break, and observation him as long as no cancer progression. - Patient has been off chemotherapy since 06/2017 . He is now on observation -We reviewed the signs of cancer recurrence. If he notices any pain or new issues he knows to call us  -We discussed his 09/05/17 CT CAP which showed no evidence of cancer in esophagus or distant metastasis. It does show increase of pleural effusion around his right lung. -he has developed pleural effusion since he stopped chemotherapy, he is clinically doing better with restarting lasix, less orthopnea, exam showed mild pleural effusion, repeated chest x-ray in December 2018 showed decreased pleural effusion.  He is on Lasix 20 mg daily now, I recommend him to follow-up with his primary care physician. -Labs reviewed (11/12/17) Liver function is elevated today. Will order a f/u CT scan today to have done before next visit.   - f/u in 4 weeks  2. New right plural effusion -Possible related to his CHF, he was on furosemide 10 mg daily before chemo, which has been held since he started chemo.   -He had a trace pleural effusion on previous scan, now has moderate right pleural effusion, asymptomatic -I recommend a thoracentesis, further workup of etiology of his pleural effusion, and ruled out malignant pleural effusion.  Patient is reluctant to have procedure, but agrees to have one if he does not respond to diuretics -Restart furosemide, 20 mg daily, I called in on 09/12/17  -Upon physical exam he still has some pleural effusion, he will continue furosemide 6m daily, okay to reduce to 10 mg daily.  He will follow-up with his primary care physician on this. - CXR on 10/21/17 reveals small bilateral  effusions, decreased on the right since prior CT. Discussed results with pt.    3. CAD, diastolic CHF, on continuous oxygen -He will continue follow-up with his cardiologist Dr. TRadford Pax-His Plavix has been held since the EGD and biopsy, he is on baby aspirin. -Need to watch his fluid intake during his chemotherapy, and also avoid fluid overload from chemo  -he is clinically stable - His BP is 98/59 today (11/12/17). He will see PCP to manage his Lasix.   4. DM, HTN -We previously discussed his blood pressure and glucose need to be monitored closely during the chemotherapy, which may affect his blood glucose and blood pressure -He will continue medication, monitor his sugar and blood pressure at home, and follow-up with his primary care physician. -previously decreased premed dexa before taxol  -History simple has been held due to his borderline low blood pressure -He will see his primary care physician Dr. MAnitra Lauthfor diabetes management.  5. OSA, morbid obesity -We previously discussed healthy diet, I encouraged him to be more physically active, and consider home PT -previously I strongly encouraged him to follow-up with dietitian during the therapy for nutrition support.  6. Arthralgia -He has baseline osteoarthritis, not physically active -However his arthralgia in his knees and ankle are much worse after chemotherapy but resolved now  -He will use oxycodone 5 mg as needed, constipation management previously reviewed with him. -Leg weakness improved with physical therapy. Previously encouraged the patient to continue with physical therapy.   7. Pancytopenia  -secondary to chemo and anemia of chronic disease  -improved   8. Goal of care discussion, DNR/DNI -We previously discussed the incurable nature of his cancer, and the overall poor prognosis, especially if he does not have good response to chemotherapy or progress on chemo -The patient understands the goal of care is  palliative. -Patient is very realistic, understands his cancer is not curable, and he agrees with DO NOT RESUSCITATE and DO NOT INTUBATE  9. Transaminitis -He had a mild elevated AST in the past.  Today (11/12/17) his AST is 162 and his ALT is 114. Pt states he has not consumed alcohol in 30 years. Will order a CT CAP w contrast today to rule out liver metastasis.   PLAN -Lab and f/u in 4 weeks  -CT CAP w Contrast in 3 weeks  -Contnue Furosemide 35m daily, PCP will manage this   All questions were answered. The patient knows to call the clinic with any problems, questions or concerns.  I spent 20 minutes counseling the patient face to face. The total time spent in the appointment was 25 minutes and more than 50% was on counseling.  This document serves as a record of services personally performed by YTruitt Merle MD. It was created on her behalf by DTheresia Bough a trained medical scribe. The creation of this record is based on the scribe's personal observations and the provider's statements to them.   I have reviewed the above documentation for accuracy and completeness, and I agree with the above.  YTruitt Merle 11/12/2017

## 2017-11-12 ENCOUNTER — Telehealth: Payer: Self-pay | Admitting: *Deleted

## 2017-11-12 ENCOUNTER — Inpatient Hospital Stay (HOSPITAL_BASED_OUTPATIENT_CLINIC_OR_DEPARTMENT_OTHER): Payer: Medicare Other | Admitting: Hematology

## 2017-11-12 ENCOUNTER — Encounter: Payer: Self-pay | Admitting: Hematology

## 2017-11-12 ENCOUNTER — Inpatient Hospital Stay: Payer: Medicare Other

## 2017-11-12 ENCOUNTER — Inpatient Hospital Stay: Payer: Medicare Other | Attending: Hematology

## 2017-11-12 VITALS — BP 98/59 | HR 79 | Temp 97.8°F | Resp 20 | Wt 287.5 lb

## 2017-11-12 DIAGNOSIS — R1310 Dysphagia, unspecified: Secondary | ICD-10-CM | POA: Insufficient documentation

## 2017-11-12 DIAGNOSIS — E875 Hyperkalemia: Secondary | ICD-10-CM | POA: Insufficient documentation

## 2017-11-12 DIAGNOSIS — E785 Hyperlipidemia, unspecified: Secondary | ICD-10-CM | POA: Diagnosis not present

## 2017-11-12 DIAGNOSIS — K5730 Diverticulosis of large intestine without perforation or abscess without bleeding: Secondary | ICD-10-CM | POA: Diagnosis not present

## 2017-11-12 DIAGNOSIS — Z9842 Cataract extraction status, left eye: Secondary | ICD-10-CM | POA: Insufficient documentation

## 2017-11-12 DIAGNOSIS — I10 Essential (primary) hypertension: Secondary | ICD-10-CM

## 2017-11-12 DIAGNOSIS — K7460 Unspecified cirrhosis of liver: Secondary | ICD-10-CM | POA: Insufficient documentation

## 2017-11-12 DIAGNOSIS — C160 Malignant neoplasm of cardia: Secondary | ICD-10-CM | POA: Diagnosis not present

## 2017-11-12 DIAGNOSIS — I2510 Atherosclerotic heart disease of native coronary artery without angina pectoris: Secondary | ICD-10-CM | POA: Diagnosis not present

## 2017-11-12 DIAGNOSIS — I252 Old myocardial infarction: Secondary | ICD-10-CM | POA: Diagnosis not present

## 2017-11-12 DIAGNOSIS — I5032 Chronic diastolic (congestive) heart failure: Secondary | ICD-10-CM

## 2017-11-12 DIAGNOSIS — Z7982 Long term (current) use of aspirin: Secondary | ICD-10-CM | POA: Insufficient documentation

## 2017-11-12 DIAGNOSIS — Z87442 Personal history of urinary calculi: Secondary | ICD-10-CM | POA: Insufficient documentation

## 2017-11-12 DIAGNOSIS — D638 Anemia in other chronic diseases classified elsewhere: Secondary | ICD-10-CM

## 2017-11-12 DIAGNOSIS — Z79899 Other long term (current) drug therapy: Secondary | ICD-10-CM | POA: Diagnosis not present

## 2017-11-12 DIAGNOSIS — N183 Chronic kidney disease, stage 3 (moderate): Secondary | ICD-10-CM | POA: Diagnosis not present

## 2017-11-12 DIAGNOSIS — K5900 Constipation, unspecified: Secondary | ICD-10-CM | POA: Insufficient documentation

## 2017-11-12 DIAGNOSIS — I700 Atherosclerosis of aorta: Secondary | ICD-10-CM | POA: Diagnosis not present

## 2017-11-12 DIAGNOSIS — J449 Chronic obstructive pulmonary disease, unspecified: Secondary | ICD-10-CM | POA: Diagnosis not present

## 2017-11-12 DIAGNOSIS — D61810 Antineoplastic chemotherapy induced pancytopenia: Secondary | ICD-10-CM | POA: Diagnosis not present

## 2017-11-12 DIAGNOSIS — Z87891 Personal history of nicotine dependence: Secondary | ICD-10-CM | POA: Insufficient documentation

## 2017-11-12 DIAGNOSIS — I4510 Unspecified right bundle-branch block: Secondary | ICD-10-CM | POA: Insufficient documentation

## 2017-11-12 DIAGNOSIS — I130 Hypertensive heart and chronic kidney disease with heart failure and stage 1 through stage 4 chronic kidney disease, or unspecified chronic kidney disease: Secondary | ICD-10-CM | POA: Insufficient documentation

## 2017-11-12 DIAGNOSIS — Z95828 Presence of other vascular implants and grafts: Secondary | ICD-10-CM

## 2017-11-12 DIAGNOSIS — M1990 Unspecified osteoarthritis, unspecified site: Secondary | ICD-10-CM | POA: Insufficient documentation

## 2017-11-12 DIAGNOSIS — E1122 Type 2 diabetes mellitus with diabetic chronic kidney disease: Secondary | ICD-10-CM | POA: Insufficient documentation

## 2017-11-12 DIAGNOSIS — Z9981 Dependence on supplemental oxygen: Secondary | ICD-10-CM | POA: Insufficient documentation

## 2017-11-12 DIAGNOSIS — E119 Type 2 diabetes mellitus without complications: Secondary | ICD-10-CM

## 2017-11-12 DIAGNOSIS — Z794 Long term (current) use of insulin: Secondary | ICD-10-CM | POA: Insufficient documentation

## 2017-11-12 LAB — CBC WITH DIFFERENTIAL/PLATELET
BASOS PCT: 0 %
Basophils Absolute: 0 10*3/uL (ref 0.0–0.1)
EOS ABS: 0.1 10*3/uL (ref 0.0–0.5)
Eosinophils Relative: 3 %
HCT: 38.4 % (ref 38.4–49.9)
HEMOGLOBIN: 11.8 g/dL — AB (ref 13.0–17.1)
LYMPHS ABS: 1.1 10*3/uL (ref 0.9–3.3)
Lymphocytes Relative: 21 %
MCH: 29.8 pg (ref 27.2–33.4)
MCHC: 30.7 g/dL — AB (ref 32.0–36.0)
MCV: 97 fL (ref 79.3–98.0)
MONOS PCT: 10 %
Monocytes Absolute: 0.5 10*3/uL (ref 0.1–0.9)
NEUTROS PCT: 66 %
Neutro Abs: 3.4 10*3/uL (ref 1.5–6.5)
PLATELETS: 96 10*3/uL — AB (ref 140–400)
RBC: 3.96 MIL/uL — ABNORMAL LOW (ref 4.20–5.82)
RDW: 18.7 % — ABNORMAL HIGH (ref 11.0–15.6)
WBC: 5.1 10*3/uL (ref 4.0–10.3)

## 2017-11-12 LAB — COMPREHENSIVE METABOLIC PANEL
ALBUMIN: 3.5 g/dL (ref 3.5–5.0)
ALK PHOS: 72 U/L (ref 40–150)
ALT: 114 U/L — ABNORMAL HIGH (ref 0–55)
ANION GAP: 10 (ref 3–11)
AST: 162 U/L — ABNORMAL HIGH (ref 5–34)
BUN: 12 mg/dL (ref 7–26)
CALCIUM: 9.2 mg/dL (ref 8.4–10.4)
CO2: 31 mmol/L — AB (ref 22–29)
Chloride: 98 mmol/L (ref 98–109)
Creatinine, Ser: 1.24 mg/dL (ref 0.70–1.30)
GFR calc non Af Amer: 56 mL/min — ABNORMAL LOW (ref >60)
GLUCOSE: 109 mg/dL (ref 70–140)
Potassium: 3.9 mmol/L (ref 3.5–5.1)
SODIUM: 139 mmol/L (ref 136–145)
Total Bilirubin: 0.8 mg/dL (ref 0.2–1.2)
Total Protein: 7.5 g/dL (ref 6.4–8.3)

## 2017-11-12 MED ORDER — SODIUM CHLORIDE 0.9% FLUSH
10.0000 mL | INTRAVENOUS | Status: DC | PRN
  Administered 2017-11-12: 10 mL via INTRAVENOUS
  Filled 2017-11-12: qty 10

## 2017-11-12 MED ORDER — HEPARIN SOD (PORK) LOCK FLUSH 100 UNIT/ML IV SOLN
500.0000 [IU] | Freq: Once | INTRAVENOUS | Status: DC | PRN
  Filled 2017-11-12: qty 5

## 2017-11-12 NOTE — Patient Instructions (Signed)

## 2017-11-12 NOTE — Telephone Encounter (Signed)
Received vm call earlier from daughter, [name] asking for call back regarding details of dad's appt today.  Returned call this pm & informed of small pleural effusions bilaterally & PCP to manage with Lasix & liver enzymes elevated & CT to be done to r/o metastasis.  She appreciated call back & information.

## 2017-11-15 ENCOUNTER — Other Ambulatory Visit: Payer: Self-pay | Admitting: Family Medicine

## 2017-11-15 MED ORDER — FUROSEMIDE 20 MG PO TABS: 20.0000 mg | ORAL_TABLET | Freq: Every day | ORAL | 1 refills | Status: DC

## 2017-11-16 ENCOUNTER — Other Ambulatory Visit: Payer: Self-pay | Admitting: Family Medicine

## 2017-11-18 NOTE — Telephone Encounter (Signed)
[facility]  RF request for gabapentin LOV: 10/25/17 Next ov: 01/22/18 Last written: 09/03/16 #[id number] w/ 3Rf  Please advise. Thanks.

## 2017-11-20 ENCOUNTER — Encounter: Payer: Self-pay | Admitting: Family Medicine

## 2017-12-05 ENCOUNTER — Inpatient Hospital Stay: Payer: Medicare Other

## 2017-12-05 ENCOUNTER — Inpatient Hospital Stay: Payer: Medicare Other | Attending: Hematology

## 2017-12-05 ENCOUNTER — Ambulatory Visit (HOSPITAL_COMMUNITY)
Admission: RE | Admit: 2017-12-05 | Discharge: 2017-12-05 | Disposition: A | Discharge status: Home / Self Care | Payer: Medicare Other | Source: Ambulatory Visit | Attending: Hematology | Admitting: Hematology

## 2017-12-05 DIAGNOSIS — J449 Chronic obstructive pulmonary disease, unspecified: Secondary | ICD-10-CM | POA: Insufficient documentation

## 2017-12-05 DIAGNOSIS — C787 Secondary malignant neoplasm of liver and intrahepatic bile duct: Secondary | ICD-10-CM | POA: Insufficient documentation

## 2017-12-05 DIAGNOSIS — I130 Hypertensive heart and chronic kidney disease with heart failure and stage 1 through stage 4 chronic kidney disease, or unspecified chronic kidney disease: Secondary | ICD-10-CM | POA: Diagnosis not present

## 2017-12-05 DIAGNOSIS — K5730 Diverticulosis of large intestine without perforation or abscess without bleeding: Secondary | ICD-10-CM | POA: Diagnosis not present

## 2017-12-05 DIAGNOSIS — Z9221 Personal history of antineoplastic chemotherapy: Secondary | ICD-10-CM | POA: Diagnosis not present

## 2017-12-05 DIAGNOSIS — J90 Pleural effusion, not elsewhere classified: Secondary | ICD-10-CM | POA: Insufficient documentation

## 2017-12-05 DIAGNOSIS — Z923 Personal history of irradiation: Secondary | ICD-10-CM | POA: Diagnosis not present

## 2017-12-05 DIAGNOSIS — E1122 Type 2 diabetes mellitus with diabetic chronic kidney disease: Secondary | ICD-10-CM | POA: Diagnosis not present

## 2017-12-05 DIAGNOSIS — I700 Atherosclerosis of aorta: Secondary | ICD-10-CM | POA: Insufficient documentation

## 2017-12-05 DIAGNOSIS — K7460 Unspecified cirrhosis of liver: Secondary | ICD-10-CM | POA: Insufficient documentation

## 2017-12-05 DIAGNOSIS — Z9981 Dependence on supplemental oxygen: Secondary | ICD-10-CM | POA: Insufficient documentation

## 2017-12-05 DIAGNOSIS — C160 Malignant neoplasm of cardia: Secondary | ICD-10-CM | POA: Insufficient documentation

## 2017-12-05 DIAGNOSIS — Z87891 Personal history of nicotine dependence: Secondary | ICD-10-CM | POA: Insufficient documentation

## 2017-12-05 DIAGNOSIS — M1990 Unspecified osteoarthritis, unspecified site: Secondary | ICD-10-CM | POA: Diagnosis not present

## 2017-12-05 DIAGNOSIS — I2510 Atherosclerotic heart disease of native coronary artery without angina pectoris: Secondary | ICD-10-CM | POA: Diagnosis not present

## 2017-12-05 DIAGNOSIS — I252 Old myocardial infarction: Secondary | ICD-10-CM | POA: Insufficient documentation

## 2017-12-05 DIAGNOSIS — Z794 Long term (current) use of insulin: Secondary | ICD-10-CM | POA: Insufficient documentation

## 2017-12-05 DIAGNOSIS — E785 Hyperlipidemia, unspecified: Secondary | ICD-10-CM | POA: Insufficient documentation

## 2017-12-05 DIAGNOSIS — I5032 Chronic diastolic (congestive) heart failure: Secondary | ICD-10-CM | POA: Insufficient documentation

## 2017-12-05 DIAGNOSIS — Z95828 Presence of other vascular implants and grafts: Secondary | ICD-10-CM

## 2017-12-05 DIAGNOSIS — Z9842 Cataract extraction status, left eye: Secondary | ICD-10-CM | POA: Insufficient documentation

## 2017-12-05 DIAGNOSIS — K8020 Calculus of gallbladder without cholecystitis without obstruction: Secondary | ICD-10-CM | POA: Diagnosis not present

## 2017-12-05 DIAGNOSIS — I4510 Unspecified right bundle-branch block: Secondary | ICD-10-CM | POA: Insufficient documentation

## 2017-12-05 DIAGNOSIS — G4733 Obstructive sleep apnea (adult) (pediatric): Secondary | ICD-10-CM | POA: Diagnosis not present

## 2017-12-05 DIAGNOSIS — R188 Other ascites: Secondary | ICD-10-CM | POA: Insufficient documentation

## 2017-12-05 DIAGNOSIS — N183 Chronic kidney disease, stage 3 (moderate): Secondary | ICD-10-CM | POA: Diagnosis not present

## 2017-12-05 DIAGNOSIS — E875 Hyperkalemia: Secondary | ICD-10-CM | POA: Insufficient documentation

## 2017-12-05 DIAGNOSIS — Z79899 Other long term (current) drug therapy: Secondary | ICD-10-CM | POA: Insufficient documentation

## 2017-12-05 DIAGNOSIS — M47896 Other spondylosis, lumbar region: Secondary | ICD-10-CM | POA: Diagnosis not present

## 2017-12-05 DIAGNOSIS — K5900 Constipation, unspecified: Secondary | ICD-10-CM | POA: Insufficient documentation

## 2017-12-05 DIAGNOSIS — R161 Splenomegaly, not elsewhere classified: Secondary | ICD-10-CM | POA: Insufficient documentation

## 2017-12-05 DIAGNOSIS — Z7982 Long term (current) use of aspirin: Secondary | ICD-10-CM | POA: Insufficient documentation

## 2017-12-05 DIAGNOSIS — Z87442 Personal history of urinary calculi: Secondary | ICD-10-CM | POA: Insufficient documentation

## 2017-12-05 LAB — CBC WITH DIFFERENTIAL/PLATELET
BASOS PCT: 1 %
Basophils Absolute: 0.1 10*3/uL (ref 0.0–0.1)
EOS ABS: 0.1 10*3/uL (ref 0.0–0.5)
EOS PCT: 3 %
HCT: 34.9 % — ABNORMAL LOW (ref 38.4–49.9)
Hemoglobin: 11.4 g/dL — ABNORMAL LOW (ref 13.0–17.1)
Lymphocytes Relative: 18 %
Lymphs Abs: 1 10*3/uL (ref 0.9–3.3)
MCH: 30.8 pg (ref 27.2–33.4)
MCHC: 32.7 g/dL (ref 32.0–36.0)
MCV: 94 fL (ref 79.3–98.0)
MONOS PCT: 10 %
Monocytes Absolute: 0.5 10*3/uL (ref 0.1–0.9)
NEUTROS PCT: 68 %
Neutro Abs: 3.7 10*3/uL (ref 1.5–6.5)
PLATELETS: 116 10*3/uL — AB (ref 140–400)
RBC: 3.71 MIL/uL — ABNORMAL LOW (ref 4.20–5.82)
RDW: 19.8 % — AB (ref 11.0–14.6)
WBC: 5.4 10*3/uL (ref 4.0–10.3)

## 2017-12-05 LAB — COMPREHENSIVE METABOLIC PANEL
ALK PHOS: 79 U/L (ref 40–150)
ALT: 84 U/L — AB (ref 0–55)
AST: 117 U/L — ABNORMAL HIGH (ref 5–34)
Albumin: 3.6 g/dL (ref 3.5–5.0)
Anion gap: 12 — ABNORMAL HIGH (ref 3–11)
BUN: 16 mg/dL (ref 7–26)
CALCIUM: 9.4 mg/dL (ref 8.4–10.4)
CO2: 31 mmol/L — ABNORMAL HIGH (ref 22–29)
CREATININE: 1.32 mg/dL — AB (ref 0.70–1.30)
Chloride: 94 mmol/L — ABNORMAL LOW (ref 98–109)
GFR, EST NON AFRICAN AMERICAN: 52 mL/min — AB (ref >60)
Glucose, Bld: 134 mg/dL (ref 70–140)
Potassium: 4 mmol/L (ref 3.5–5.1)
Sodium: 137 mmol/L (ref 136–145)
Total Bilirubin: 1 mg/dL (ref 0.2–1.2)
Total Protein: 7.7 g/dL (ref 6.4–8.3)

## 2017-12-05 MED ORDER — IOPAMIDOL (ISOVUE-300) INJECTION 61%
INTRAVENOUS | Status: AC
  Filled 2017-12-05: qty 100

## 2017-12-05 MED ORDER — SODIUM CHLORIDE 0.9 % IJ SOLN
INTRAMUSCULAR | Status: AC
  Filled 2017-12-05: qty 50

## 2017-12-05 MED ORDER — HEPARIN SOD (PORK) LOCK FLUSH 100 UNIT/ML IV SOLN
INTRAVENOUS | Status: AC
  Filled 2017-12-05: qty 5

## 2017-12-05 MED ORDER — HEPARIN SOD (PORK) LOCK FLUSH 100 UNIT/ML IV SOLN
500.0000 [IU] | Freq: Once | INTRAVENOUS | Status: AC
  Administered 2017-12-05: 500 [IU] via INTRAVENOUS

## 2017-12-05 MED ORDER — IOPAMIDOL (ISOVUE-300) INJECTION 61%
100.0000 mL | Freq: Once | INTRAVENOUS | Status: AC | PRN
  Administered 2017-12-05: 100 mL via INTRAVENOUS

## 2017-12-05 MED ORDER — SODIUM CHLORIDE 0.9% FLUSH
10.0000 mL | INTRAVENOUS | Status: DC | PRN
  Administered 2017-12-05: 10 mL via INTRAVENOUS
  Filled 2017-12-05: qty 10

## 2017-12-05 NOTE — Patient Instructions (Signed)

## 2017-12-08 DIAGNOSIS — J449 Chronic obstructive pulmonary disease, unspecified: Secondary | ICD-10-CM | POA: Diagnosis not present

## 2017-12-10 NOTE — Progress Notes (Signed)
[facility] [place]  [phone] [phone]  Clinic Follow up Note   Patient Care Team: Tammi Sou, MD as PCP - General (Family Medicine) Harold Hedge, Darrick Grinder, MD as Consulting Physician (Allergy and Immunology) Shirley Muscat Loreen Freud, MD as Consulting Physician (Optometry) Sueanne Margarita, MD as Consulting Physician (Cardiology) Pyrtle, Lajuan Lines, MD as Consulting Physician (Gastroenterology) Latanya Maudlin, MD as Consulting Physician (Orthopedic Surgery) Jodi Marble, MD as Consulting Physician (Otolaryngology) Truitt Merle, MD as Consulting Physician (Hematology) Kyung Rudd, MD as Consulting Physician (Radiation Oncology)   Date of Service: 12/12/2017     CHIEF COMPLAINTS:  Follow up GE junction cancer  Oncology History   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 The mass was not obstructing and partially circumferential, extends approximately 5 cm. Nonbleeding erosive gastropathy.      05/04/2016 Initial Biopsy    Esophageal gastric junction biopsy showed a poorly differentiated carcinoma underlying the squamous mucosa. There is lymphovascular invasion. No Intestinal metaplasia. IHC weakly positive CK5/6, p63 (-), favor squamous.        05/09/2016 Imaging    CT CAP w contrast showed mild wall thickening involving the distal esophagus and proximal stomach compatible with known cancer, enlarged mediastinal and upper abdominal lymph nodes are highly suspicious for metastatic adenopathy, propable liver cirrhosis.      06/04/2016 -  06/22/2016 Radiation Therapy    palliative radiation to esophageal cancer       07/06/2016 - 06/20/2017 Chemotherapy    chemotherapy with weekly carboplatin and taxol, started on 07/06/2016, held 08/21/16-09/28/2016 due to hospitalization, changed to every 2 weeks from 11/30/2016. Chemo stopped due to no evidence of disease on restaging scan.      07/30/2016 Pathology Results    PD-L1 negative expression      08/28/2016 - 08/31/2016 Hospital Admission    The patient was admitted to 4 severe hypoglycemia, dehydration, and acute renal failure. Infection workup was negative. He recovered well with supportive care. His insulin and hypertension medication was held on discharge, except insulin sliding scale.      09/24/2016 Imaging    CT CAP W CONTRAST 09/26/2016 IMPRESSION: Decreased masslike soft tissue prominence at gastroesophageal junction, consistent with decreased size of primary gastroesophageal junction carcinoma. Resolution of paraesophageal and gastrohepatic ligament lymphadenopathy since prior exam. Other sub-cm mediastinal lymph nodes show little or no significant change. Stable indeterminate sub-cm low-attenuation lesion in the liver dome. Probable hepatic cirrhosis. Recommend continued attention on follow-up CT. No new or progressive metastatic disease identified. Cholelithiasis.  No radiographic evidence of cholecystitis. Colonic diverticulosis. No radiographic evidence of diverticulitis. Aortic atherosclerosis and three-vessel coronary artery calcification.      01/07/2017 Imaging    CT CAP w contrast 1. No residual esophageal mass or adenopathy. 2. New nodular airspace opacification in the left lower lobe. Continued attention on followup exams is warranted as metastatic disease cannot be definitively excluded. 3. Cirrhosis. 4. Trace left pleural effusion. 5. Aortic atherosclerosis (ICD10-170.0). Coronary artery calcification. 6. Cholelithiasis. 7. Mild basilar predominant  subpleural  reticular densities. Difficult to exclude interstitial lung disease.      04/29/2017 Imaging    CT CAP w contrast  IMPRESSION: 1. No evidence of metastatic disease. 2. Nodular airspace disease previously seen in the left lower lobe has resolved in the interval. 3. Tiny right pleural effusion, new. Small left pleural effusion, slightly increased. 4. Cirrhosis with splenomegaly. 5.  Aortic atherosclerosis (ICD10-170.0).      09/05/2017 Imaging    CT CAP W Contrast 09/05/17  IMPRESSION: 1. Currently no esophageal mass is visible, nor is there significant mediastinal adenopathy. There is hepatic cirrhosis but no compelling findings of hepatic metastatic disease. 2. Large right pleural effusion is increased from the prior exam. Small stable left pleural effusion. 3. Small but increased amount of ascites and mesenteric edema compared to prior. 4. Other imaging findings of potential clinical significance: Aortic Atherosclerosis (ICD10-I70.0). Coronary atherosclerosis. The markedly severe right glenohumeral arthropathy. Thoracic and lumbar spondylosis with lower lumbar multilevel impingement, and notable intervertebral spurring at T12-L1. Cholelithiasis. Degenerative arthropathy of both hips.       10/21/2017 Imaging    Chest X-Ray IMPRESSION: Small bilateral effusions, decreased on the right since prior CT.      12/05/2017 Imaging    CT CAP W Contrast 12/05/17 IMPRESSION: 1. Stable exam. No esophageal mass or mediastinal adenopathy identified. No evidence for metastatic disease to the chest, abdomen and pelvis. 2. Cirrhosis with ascites. 3. Persistent bilateral pleural effusions, right greater than left. 4. Aortic Atherosclerosis (ICD10-I70.0). Three vessel coronary artery atherosclerotic calcifications noted. 5. Thoracic and lumbar spondylosis and advanced right glenohumeral joint arthropathy. 6. Gallstones.       HISTORY OF PRESENTING ILLNESS:  [name]  72 y.o. male is here because of His reason that diagnosed GE junction carcinoma. He is a comment of by his wife and daughter to our multidisciplinary chart clinic today.  He has been having progressive dysphagia and odynophagia for 2 month, he has been eating soft diet only in the past one week. He has pain in the mid chest and epigastric area only when he eats, with burning sensation, no nausea, abdominal bloating, or other discomfort. His appetite has remained well, no weight loss,   He has multiple medical problems, especially heart failure, he has been on oxygen continuously for 5-6 years. He is morbidly obese, has diabetes, hypertension, mild neuropathy, OSA etc. He has very sedentary life style, he sits in the chair and watch TV most of time during the day. He only comes out for shopping for a few times a week. He uses a [employer], he can walk for a few hundreds feet before he has to stop to catch his breasts. He denies cough or sputum production, no GI discomfort, he has been having mild diarrhea, loose stool, twice daily, no melena or hematochezia.  CURRENT THERAPY: Observation   INTERIM HISTORY:  JASHAUN PENROSE returns for follow-up. He presents to the clinic on continuous O2 and accompanied by his daughter. He reviewed his medication list. He notes his breathing is fine overall. He notes for the last 2-3 weeks he will fall asleep soon after sitting down. He feels fatigued most of the time and will sleep over 8 hours. He is able to walk with a walker at home. He has fallen many times before due to his equilibrium problems. He takes Lasix and notes he has had some muscle cramps from this.    On review of symptoms, pt notes he is tired most  of the time. He eats well. Pt noted he is unbalanced and unstable standing alone due to his equilibrium. Pt notes muscle cramps.     MEDICAL HISTORY:  Past Medical History:  Diagnosis Date  . Asthma    as a child  . CAD in native artery     a. cardiac cath 09/2013 showed severely diseased mLAD and diagonal, mild LCx disease, moderate RCA disease, LVEDP 20, EF 50%.  . Cataract    multiple types, bilateral  . Cholelithiasis without cholecystitis   . Chronic diastolic CHF (congestive heart failure) ([place]) 02/25/2009  . Chronic renal insufficiency, stage III (moderate) (HCC) 2017   Stage II/III (GFR around 60)  . 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 COPD (chronic obstructive pulmonary disease) ([place])   . Coronary artery disease    chronically occluded LAD and diagonal with right to left collaterals, mild disease in the left circ and moderate disease in the mid RCA on medical management with Imdur, ASA, and Plavix.  . Dermatitis 05/2014  . DIABETES MELLITUS, TYPE II 06/24/2007   No diab retpthy as of 08/05/15 eye exam.  . 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 Pyrtle--EGD).  CT C/A/P showed metastatic adenopathy in mediastinum and upper abdomen 05/09/16.  Tx plan is palliative radiation (completed 06/22/16), then palliative systemic chemotherapy (carbo+taxol) was started but as of 08/03/16 onc f/u this was held due to severe knee and ankle arthralgias.  Restarting as of 10/2015 (08/2016 CT showed some disease regression  . Esophageal cancer ([place])    CT 12/2016 showed no residual esoph mass--plan to continue chemo.  Ongoing palliative chemo with carboplatin and taxol q 2 wks as of 03/2017 onc f/u (CT 12/2016 and 04/2017 showed no progression of dz).  Taking a 2 mo break from chemo as of 05/2017.  Pleural effusion-- improved with lasix but onc wanted him to get dx/therap thoracentesis-pt declined.  AST/ALT up some at 10/2017 f/u, so CT CAP ordered.  . Essential hypertension 05/01/2007   Qualifier: Diagnosis of  By: Tiney Rouge CMA, Ellison Hughs     . History of kidney stones   . Hyperkalemia 12/2015    Decreased ACE-I by 50% in response, then potassium normalized.  Marland Kitchen HYPERLIPIDEMIA 06/24/2007  . Hypoxemia 01/27/2010  . Morbid obesity ([place])   . Myocardial infarction [facility])    pt states he was informed per MD that he has had one but pt was unaware   . Obesity hypoventilation syndrome (HCC)    oxygen 24/7 (2 liters [place] as of 02/2016)  . On home oxygen therapy    Oxygen @ 2l/m nasally 24/7 hours  . OSA (obstructive sleep apnea)    not tested; pt scored 4 per stop bang tool results sent to PCP   . OSTEOARTHRITIS 05/01/2007  . Pancytopenia due to chemotherapy ([name]) 2018  . Pleural effusion on right 08/2017   ? malignant vs CHF: pt refuses diagnostic thoracentesis b/c he states he feels fine, wants to try diuretic first.  . Pleural effusion on right 09/2017   Thoracentesis  . Presbycusis of both ears 05/2015   [facility]  . Pruritic condition 05/2014   Allergist summer 2015, no new testing.  Marland Kitchen RBBB   . Visual field defect 05/10/2016   Per Dr. Melina Fiddler, O.D.: Rt, loss of inf/temp quad and some loss sup/temp.  ?CVA  ?  Pituitary tumor? 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    SURGICAL HISTORY: Past Surgical History:  Procedure Laterality Date  . APPENDECTOMY    . BALLOON DILATION N/A 05/04/2016   Procedure: BALLOON DILATION;  Surgeon: Jerene Bears, MD;  Location: [facility];  Service: Gastroenterology;  Laterality: N/A;  . CARDIAC CATHETERIZATION    . CARPAL TUNNEL RELEASE Left   . CATARACT EXTRACTION W/PHACO Right 04/29/2013   Procedure: CATARACT EXTRACTION PHACO AND INTRAOCULAR LENS PLACEMENT (IOC);  Surgeon: Adonis Brook, MD;  Location: [place];  Service: Ophthalmology;  Laterality: Right;  . CATARACT EXTRACTION, BILATERAL    . COLONOSCOPY W/ POLYPECTOMY  08/2011   Many polyps--all hyperplastic, severe diverticulosis, int hem.  [employer] hemoccult testing via [facility] 06/22/15 NEG  . ESOPHAGOGASTRODUODENOSCOPY N/A 05/04/2016   Procedure: ESOPHAGOGASTRODUODENOSCOPY  (EGD);  Surgeon: Jerene Bears, MD;  Location: [facility];  Service: Gastroenterology;  Laterality: N/A;  . IR GENERIC HISTORICAL  10/03/2016   IR US GUIDE VASC ACCESS RIGHT 10/03/2016 Greggory Keen, MD [facility]  . IR GENERIC HISTORICAL  10/03/2016   IR FLUORO GUIDE PORT INSERTION RIGHT 10/03/2016 Greggory Keen, MD [facility]  . KNEE ARTHROSCOPY WITH MEDIAL MENISECTOMY Right 12/16/2014   Procedure: RIGHT KNEE ARTHROSCOPY WITH MEDIAL MENISECTOMY microfracture medial femoral condyle abrasion condroplasty medial femoral condyle lateral menisectomy;  Surgeon: Tobi Bastos, MD;  Location: [facility] OR[facility];  Service: Orthopedics;  Laterality: Right;  . LEFT HEART CATHETERIZATION WITH CORONARY ANGIOGRAM N/A 10/26/2013   Procedure: LEFT HEART CATHETERIZATION WITH CORONARY ANGIOGRAM;  Surgeon: Jettie Booze, MD;  Location: [facility] CATH LAB;  Service: Cardiovascular;  Laterality: N/A;  . LUMBAR [place] SURGERY  2001  . SHOULDER SURGERY Right    right fx  . TONSILLECTOMY    . TRANSTHORACIC ECHOCARDIOGRAM  03/2016   EF 55-60%, grade I DD, LAE  . WRIST SURGERY Right    right fx    SOCIAL HISTORY: Social History   Socioeconomic History  . Marital status: Married    Spouse name: susan  . Number of children: 2  . Years of education: Not on file  . Highest education level: Not on file  Social Needs  . Financial resource strain: Not on file  . Food insecurity - worry: Not on file  . Food insecurity - inability: Not on file  . Transportation needs - medical: Not on file  . Transportation needs - non-medical: Not on file  Occupational History  . Occupation: Retired  Tobacco Use  . Smoking status: Former Smoker    Packs/day: 1.50    Years: 30.00    Pack years: 45.00    Types: Cigarettes, Pipe, Cigars    Last attempt to quit: 10/30/1983    Years since quitting: 34.1  . Smokeless tobacco: Former Systems developer    Types: [place] date: 05/15/2016  Substance and Sexual Activity  . Alcohol use: No  . Drug  use: No  . Sexual activity: Not Currently  Other Topics Concern  . Not on file  Social History Narrative   Married, one son and one daughter.   His daughter and her two children live with him.   Coffee daily.  Former smoker.  No alcohol.   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   Attends church weekly-   Oxygen continuous    FAMILY HISTORY: Family History  Problem Relation Age of Onset  . Heart attack  Mother   . Aneurysm Father   . Alcohol abuse Father   . Diabetes Maternal Aunt   . Colon cancer Neg Hx     ALLERGIES:  has No Known Allergies.  MEDICATIONS:  Current Outpatient Medications  Medication Sig Dispense Refill  . aspirin 81 MG tablet Take 81 mg by mouth daily.      Marland Kitchen atorvastatin (LIPITOR) 80 MG tablet TAKE 1 TABLET BY MOUTH ONCE DAILY 90 tablet 3  . baclofen (LIORESAL) 10 MG tablet 1/2 - 1 tab po q8h prn muscle spasms 30 each 1  . beta carotene w/minerals (OCUVITE) tablet Take 1 tablet by mouth daily.    . cetirizine (ZYRTEC) 10 MG tablet Take 10 mg by mouth daily.    . citalopram (CELEXA) 20 MG tablet TAKE 1 TABLET BY MOUTH ONCE DAILY 90 tablet 1  . clonazePAM (KLONOPIN) 1 MG tablet TAKE 1 TABLET BY MOUTH TWICE DAILY AS NEEDED FOR ANXIETY 60 tablet 5  . clotrimazole-betamethasone (LOTRISONE) cream Apply to affected area twice daily 45 g 2  . cromolyn (OPTICROM) 4 % ophthalmic solution     . ezetimibe (ZETIA) 10 MG tablet Take 1 tablet (10 mg total) by mouth daily. 90 tablet 3  . furosemide (LASIX) 20 MG tablet Take 1 tablet (20 mg total) by mouth daily. 90 tablet 1  . gabapentin (NEURONTIN) 300 MG capsule TAKE ONE CAPSULE BY MOUTH THREE TIMES DAILY 270 capsule 3  . HUMULIN N KWIKPEN 100 UNIT/ML Kiwkpen INJECT 16 UNITS EVERY MORNING AND 16 UNITS EVERY EVENING 15 mL 3  . hydrOXYzine (ATARAX/VISTARIL) 25 MG tablet TAKE 1 TABLET BY MOUTH AT SUPPER AND 1 TAB AT BEDTIME FOR INSOMNIA 60 tablet 6  . ibuprofen (ADVIL,MOTRIN) 200 MG tablet Take  400 mg by mouth every 6 (six) hours as needed.    . Insulin Lispro (HUMALOG KWIKPEN) 200 UNIT/ML SOPN Inject 20 Units into the skin 2 (two) times daily with a meal. (Patient taking differently: Inject 16 Units into the skin 2 (two) times daily with a meal. Takes  16 units  Twice  Daily.) 18 pen 6  . Insulin NPH, Human,, Isophane, (HUMULIN N) 100 UNIT/ML Kiwkpen 16 units every morning and 16 units every evening 15 mL 3  . magnesium chloride (SLOW-MAG) 64 MG TBEC SR tablet Take 1 tablet (64 mg total) by mouth daily. 60 tablet 0  . metFORMIN (GLUCOPHAGE) 1000 MG tablet TAKE 1 TABLET BY MOUTH TWICE DAILY WITH MEALS 180 tablet 1  . Omega-3 Fatty Acids ([employer] FISH OIL) 1000 MG CAPS Take 2 tablets by mouth 2 (two) times daily. 90 capsule   . ONE TOUCH ULTRA TEST test strip USE  STRIP TO CHECK GLUCOSE THREE TIMES DAILY AS  DIRECTED 100 each 11  . ONETOUCH DELICA LANCETS 13K MISC USE ONE  TO CHECK GLUCOSE THREE TIMES DAILY 100 each 11  . oxyCODONE (OXY IR/ROXICODONE) 5 MG immediate release tablet Take 1 tablet (5 mg total) by mouth every 8 (eight) hours as needed for severe pain. 30 tablet 0  . OXYGEN Inhale 2 L/min into the lungs continuous.    . pantoprazole (PROTONIX) 40 MG tablet Take 1 tablet (40 mg total) by mouth 2 (two) times daily. 60 tablet 6   No current facility-administered medications for this visit.    Facility-Administered Medications Ordered in Other Visits  Medication Dose Route Frequency Provider Last Rate Last Dose  . sodium chloride flush (NS) 0.9 % injection 10 mL  10 mL Intravenous PRN Burr Medico,  Krista Blue, MD   10 mL at 04/16/17 1459  . sodium chloride flush (NS) 0.9 % injection 10 mL  10 mL Intravenous PRN Truitt Merle, MD   10 mL at 05/02/17 1725    REVIEW OF SYSTEMS:  Constitutional: Denies fevers, abnormal night sweats  Eyes: Denies blurriness of vision, double vision or watery eyes Ears, nose, mouth, throat, and face: Denies mucositis or sore throat, Respiratory: Denies cough, wheezes     Cardiovascular: Denies palpitation, chest discomfort or lower extremity swelling Gastrointestinal:  Denies nausea, heartburn or change in bowel habits  Skin: Denies abnormal skin rashes  Lymphatics: Denies new lymphadenopathy or easy bruising Neurological:Denies numbness, tingling (+) dizziness  Behavioral/Psych: Mood is stable, no new changes  MSK: (+) arthritis, muscle cramps  All other systems were reviewed with the patient and are negative.  PHYSICAL EXAMINATION:  ECOG PERFORMANCE STATUS: 3 - Symptomatic, >50% confined to bed BP 121/66 (BP Location: Right Arm, Patient Position: Sitting)   Pulse 92   Temp 98.1 F (36.7 C) (Oral)   Resp 16   Ht '5\' 8"'  (1.727 m)   Wt 292 lb 9.6 oz (132.7 kg)   SpO2 94%   BMI 44.49 kg/m    GENERAL:alert, no distress and comfortable, morbid obese, sitting in wheelchair with nasal cannula oxygen  SKIN: skin color, texture, turgor are normal, no rashes or significant lesions EYES: normal, conjunctiva are pink and non-injected, sclera clear OROPHARYNX:no exudate, no erythema and lips, buccal mucosa, and tongue normal  NECK: supple, thyroid normal size, non-tender, without nodularity LYMPH:  no palpable lymphadenopathy in the cervical, axillary or inguinal LUNGS: (+) decreased in breath sounds in the bottom of right lung, less than last visit (+) b/l pleural effusion R>L HEART: regular rate & rhythm and no murmurs and no lower extremity edema ABDOMEN:abdomen soft, non-tender and normal bowel sounds Musculoskeletal:no cyanosis of digits and no clubbing  PSYCH: alert & oriented x 3 with fluent speech NEURO: no focal motor/sensory deficits EXT: Trace edema, he wears compression stocks, no significant ankle swollen, erythema, or warmness.   LABORATORY DATA:  I have reviewed the data as listed CBC Latest Ref Rng & Units 12/05/2017 11/12/2017 10/03/2017  WBC 4.0 - 10.3 K/uL 5.4 5.1 5.5  Hemoglobin 13.0 - 17.1 g/dL 11.4(L) 11.8(L) 12.1(L)  Hematocrit 38.4 -  49.9 % 34.9(L) 38.4 39.0  Platelets 140 - 400 K/uL 116(L) 96(L) 107(L)   CMP Latest Ref Rng & Units 12/05/2017 11/12/2017 10/03/2017  Glucose 70 - 140 mg/dL 134 109 196(H)  BUN 7 - 26 mg/dL 16 12 13.9  Creatinine 0.70 - 1.30 mg/dL 1.32(H) 1.24 1.2  Sodium 136 - 145 mmol/L 137 139 136  Potassium 3.5 - 5.1 mmol/L 4.0 3.9 3.8  Chloride 98 - 109 mmol/L 94(L) 98 -  CO2 22 - 29 mmol/L 31(H) 31(H) 29  Calcium 8.4 - 10.4 mg/dL 9.4 9.2 9.3  Total Protein 6.4 - 8.3 g/dL 7.7 7.5 7.4  Total Bilirubin 0.2 - 1.2 mg/dL 1.0 0.8 0.77  Alkaline Phos 40 - 150 U/L 79 72 68  AST 5 - 34 U/L 117(H) 162(H) 64(H)  ALT 0 - 55 U/L 84(H) 114(H) 50   PATHOLOGY REPORT Diagnosis 05/04/2016 1. Esophagogastric junction, biopsy, ulcerative mass - POORLY DIFFERENTIATED CARCINOMA, SEE COMMENT. 2. Stomach, biopsy - REACTIVE GASTROPATHY. - NEGATIVE FOR HELICOBACTER PYLORI. - NO INTESTINAL METAPLASIA, DYSPLASIA, OR MALIGNANCY. Microscopic Comment 1. The majority of the specimen consists of gastroesophageal mucosa with reflux changes. There is a small focus of tumor  underlying the squamous mucosa. There is lymphovascular invasion. There is no background intestinal metaplasia (Barrett's esophagus). Immunohistochemistry reveals only very focal weak cytokeratin 5/6, negative p63, and negative mucicarmine in the limited remaining tumor. Dr. Saralyn Pilar has reviewed the case. The case was called to Dr. Hilarie Fredrickson on 05/08/2016. 2. A Warthin-Starry stain is performed to determine the possibility of the presence of Helicobacter pylori. The Warthin-Starry stain is negative for organisms of Helicobacter pylori.     RADIOGRAPHIC STUDIES: I have personally reviewed the radiological images as listed and agreed with the findings in the report.  CT CAP W Contrast 12/05/17 IMPRESSION: 1. Stable exam. No esophageal mass or mediastinal adenopathy identified. No evidence for metastatic disease to the chest, abdomen and pelvis. 2. Cirrhosis with  ascites. 3. Persistent bilateral pleural effusions, right greater than left. 4. Aortic Atherosclerosis (ICD10-I70.0). Three vessel coronary artery atherosclerotic calcifications noted. 5. Thoracic and lumbar spondylosis and advanced right glenohumeral joint arthropathy. 6. Gallstones.   Chest X-Ray 10/21/17 IMPRESSION: Small bilateral effusions, decreased on the right since prior CT.  CT CAP W Contrast 09/05/17  IMPRESSION: 1. Currently no esophageal mass is visible, nor is there significant mediastinal adenopathy. There is hepatic cirrhosis but no compelling findings of hepatic metastatic disease. 2. Large right pleural effusion is increased from the prior exam. Small stable left pleural effusion. 3. Small but increased amount of ascites and mesenteric edema compared to prior. 4. Other imaging findings of potential clinical significance: Aortic Atherosclerosis (ICD10-I70.0). Coronary atherosclerosis. The markedly severe right glenohumeral arthropathy. Thoracic and lumbar spondylosis with lower lumbar multilevel impingement, and notable intervertebral spurring at T12-L1. Cholelithiasis. Degenerative arthropathy of both hips.   CT CAP w contrast 04/29/2017 IMPRESSION: 1. No evidence of metastatic disease. 2. Nodular airspace disease previously seen in the left lower lobe has resolved in the interval. 3. Tiny right pleural effusion, new. Small left pleural effusion, slightly increased. 4. Cirrhosis with splenomegaly. 5.  Aortic atherosclerosis (ICD10-170.0).   CT CAP w contrast 01/07/2017 IMPRESSION: 1. No residual esophageal mass or adenopathy. 2. New nodular airspace opacification in the left lower lobe. Continued attention on followup exams is warranted as metastatic disease cannot be definitively excluded. 3. Cirrhosis. 4. Trace left pleural effusion. 5. Aortic atherosclerosis (ICD10-170.0). Coronary artery calcification. 6. Cholelithiasis. 7. Mild basilar  predominant subpleural reticular densities. Difficult to exclude interstitial lung disease.  EGD 05/04/2016 A large, ulcerating mass with no active bleeding and no stigmata of recent bleeding was found at the gastroesophageal junction extending into the gastric cardia, beginning 40 cm from the incisors. The mass was non-obstructing and partially circumferential (involving one-half of the lumen circumference). The mass extends approximately 5 cm. IMPRESSION:  -Likely malignant esophageal tumor was found at the gastroesophageal junction extending into the gastric cardia. Biopsied. - Non-bleeding erosive gastropathy. Biopsied. - Erythematous duodenopathy. - Normal second portion of the duodenum.  ASSESSMENT & PLAN: 72 y.o. Caucasian male, with multiple comorbidities, including hypertension, diabetes, OSA, coronary artery disease, diastolic CHF, on continuous oxygen, morbid obesity, presented with progressive dysphagia and odynophagia.  1. Cancer of cardio-esophageal junction, poorly differentiated, cTxN2M1 with node metastasis, stage IV  -I previously reviewed his CT scan, EGD, and the biopsy results with patient and his family members in great details. -His biopsy pathology was previously reviewed in our tumor board, the morphology and weakly positive for CK 5/6, fevers squamous cell carcinoma -His CT scan findings are very concerning for distant metastasis to abdominal lymph nodes.  -I previously reviewed the PET scan images with patient  and his daughter in person, he has hypermetabolic GE junction tumor, and diffuse adenopathy in mediastinum and upper abdomen. -We previously discussed the aggressive nature of esophageal cancer, an incurable nature of his disease due to the distant metastasis. The goal of therapy is palliative, to prolong his life and palliate his symptoms. -His tumor was negative for PD-L1, not a candidate for immunotherapy  -He received first line palliative chemotherapy with  weekly carbo and Taxol, every 2 weeks, tolerated well overall and had excellent response (completed radiographic response). - Patient has been off chemotherapy since 06/2017 due to NED on scan. He is now on observation -We reviewed the signs of cancer recurrence. If he notices any pain or new issues he knows to call us  -he has developed pleural effusion since he stopped chemotherapy, he is clinically doing better with restarting lasix, less orthopnea, exam showed mild pleural effusion, repeated chest x-ray in December 2018 showed decreased pleural effusion.  He is on Lasix 20 mg daily now, I recommend him to follow-up with his primary care physician. -I discussed his CT CAP from 12/05/17 which shows no evidence of recurrent or metastatic disease. Also shows cirrhosis with ascites, and persistent b/l pleural effusion.  -I discussed the option of an upper endoscopy. Given his lack of symptoms and stable scan he does not need one at this time.  -Will repeat next scan in 4 months  -Labs reviewed with pt.  -I suggest pt eats slightly smaller meals and to try to remain active after eating to help with his tiredness.  -F/u in 2 months   2. Bilateral plural effusion, R>L -Possible related to his CHF, he was on furosemide 10 mg daily before chemo, which has been held since he started chemo.   -He had a trace pleural effusion on previous scan, has moderate right pleural effusion, asymptomatic -I recommend a thoracentesis, further workup of etiology of his pleural effusion, and ruled out malignant pleural effusion. Patient is reluctant to have procedure, but agrees to have one if he does not respond to diuretics -Restart furosemide, 20 mg daily, I called in on 09/12/17  -CXR on 10/21/17 reveals small bilateral effusions, decreased on the right since prior CT.   -CT from 12/05/17 shows persistent b/l pleural effusion -Pt experiences cramps from lasix but manageable.  I suggest tonic water or Gatorade and for him  to take OTC calcium pill. He is fine to eat 1 banana a day.  -Upon physical exam today (12/12/17) he still has pleural effusion, R>L, he will continue furosemide 58m daily    3. CAD, diastolic CHF, on continuous oxygen -He will continue follow-up with his cardiologist Dr. TRadford Pax-His Plavix has been held since the EGD and biopsy, he is on baby aspirin. -he is clinically stable -On 2 Liters of continuous oxygen [place].  -Will continue to monitor   4. DM, HTN -We previously discussed his blood pressure and glucose need to be monitored closely during the chemotherapy, which may affect his blood glucose and blood pressure -He will continue medication, monitor his sugar and blood pressure at home, and follow-up with his primary care physician. -previously decreased premed dexa before taxol  -History simple has been held due to his borderline low blood pressure -He will see his primary care physician Dr. MAnitra Lauthfor diabetes management.  5. OSA, morbid obesity -We previously discussed healthy diet, I encouraged him to be more physically active, and consider home PT -previously I strongly encouraged him to follow-up with dietitian  during the therapy for nutrition support.  6. Arthralgia -He has baseline osteoarthritis, not physically active -However his arthralgia in his knees and ankle are much worse after chemotherapy but resolved now. -He will use oxycodone 5 mg as needed, constipation management previously reviewed with him. -Leg weakness improved with physical therapy. Previously encouraged the patient to continue with physical therapy.   7. Liver cirrhosis and ascites --He had a mild elevated AST in the past. Pt states he has not consumed alcohol in 30 years.  -His CT scan showed evidence of liver cirrhosis, and recent CT also showed mild to moderate ascites -He had no known history of liver disease, this could be related to fatty liver or congestive heart failure.   -Continue  diuretics, and follow-up  8. Goal of care discussion, DNR/DNI -We previously discussed the incurable nature of his cancer, and the overall poor prognosis, especially if he does not have good response to chemotherapy or progress on chemo -The patient understands the goal of care is palliative. -Patient is very realistic, understands his cancer is not curable, and he agrees with DO NOT RESUSCITATE and DO NOT INTUBATE   PLAN -Lab and CT scan reviewed with patient, no evidence of recurrent or metastatic disease  -lab and f/u in 2 months    All questions were answered. The patient knows to call the clinic with any problems, questions or concerns.  I spent 20 minutes counseling the patient face to face. The total time spent in the appointment was 25 minutes and more than 50% was on counseling.  This document serves as a record of services personally performed by Truitt Merle, MD. It was created on her behalf by Joslyn Devon, a trained medical scribe. The creation of this record is based on the scribe's personal observations and the provider's statements to them.    I have reviewed the above documentation for accuracy and completeness, and I agree with the above.  Truitt Merle  12/12/2017

## 2017-12-12 ENCOUNTER — Inpatient Hospital Stay (HOSPITAL_BASED_OUTPATIENT_CLINIC_OR_DEPARTMENT_OTHER): Payer: Medicare Other | Admitting: Hematology

## 2017-12-12 ENCOUNTER — Telehealth: Payer: Self-pay | Admitting: Hematology

## 2017-12-12 ENCOUNTER — Encounter: Payer: Self-pay | Admitting: Hematology

## 2017-12-12 VITALS — BP 121/66 | HR 92 | Temp 98.1°F | Resp 16 | Ht 68.0 in | Wt 292.6 lb

## 2017-12-12 DIAGNOSIS — R188 Other ascites: Secondary | ICD-10-CM

## 2017-12-12 DIAGNOSIS — I700 Atherosclerosis of aorta: Secondary | ICD-10-CM | POA: Diagnosis not present

## 2017-12-12 DIAGNOSIS — Z9221 Personal history of antineoplastic chemotherapy: Secondary | ICD-10-CM

## 2017-12-12 DIAGNOSIS — C787 Secondary malignant neoplasm of liver and intrahepatic bile duct: Secondary | ICD-10-CM

## 2017-12-12 DIAGNOSIS — I2510 Atherosclerotic heart disease of native coronary artery without angina pectoris: Secondary | ICD-10-CM | POA: Diagnosis not present

## 2017-12-12 DIAGNOSIS — R161 Splenomegaly, not elsewhere classified: Secondary | ICD-10-CM

## 2017-12-12 DIAGNOSIS — K5730 Diverticulosis of large intestine without perforation or abscess without bleeding: Secondary | ICD-10-CM | POA: Diagnosis not present

## 2017-12-12 DIAGNOSIS — M1990 Unspecified osteoarthritis, unspecified site: Secondary | ICD-10-CM | POA: Diagnosis not present

## 2017-12-12 DIAGNOSIS — I4510 Unspecified right bundle-branch block: Secondary | ICD-10-CM | POA: Diagnosis not present

## 2017-12-12 DIAGNOSIS — E785 Hyperlipidemia, unspecified: Secondary | ICD-10-CM | POA: Diagnosis not present

## 2017-12-12 DIAGNOSIS — K5900 Constipation, unspecified: Secondary | ICD-10-CM | POA: Diagnosis not present

## 2017-12-12 DIAGNOSIS — Z79899 Other long term (current) drug therapy: Secondary | ICD-10-CM | POA: Diagnosis not present

## 2017-12-12 DIAGNOSIS — E1122 Type 2 diabetes mellitus with diabetic chronic kidney disease: Secondary | ICD-10-CM | POA: Diagnosis not present

## 2017-12-12 DIAGNOSIS — K7460 Unspecified cirrhosis of liver: Secondary | ICD-10-CM

## 2017-12-12 DIAGNOSIS — Z923 Personal history of irradiation: Secondary | ICD-10-CM | POA: Diagnosis not present

## 2017-12-12 DIAGNOSIS — N183 Chronic kidney disease, stage 3 (moderate): Secondary | ICD-10-CM | POA: Diagnosis not present

## 2017-12-12 DIAGNOSIS — J90 Pleural effusion, not elsewhere classified: Secondary | ICD-10-CM

## 2017-12-12 DIAGNOSIS — C160 Malignant neoplasm of cardia: Secondary | ICD-10-CM

## 2017-12-12 DIAGNOSIS — I252 Old myocardial infarction: Secondary | ICD-10-CM | POA: Diagnosis not present

## 2017-12-12 DIAGNOSIS — J449 Chronic obstructive pulmonary disease, unspecified: Secondary | ICD-10-CM | POA: Diagnosis not present

## 2017-12-12 DIAGNOSIS — G4733 Obstructive sleep apnea (adult) (pediatric): Secondary | ICD-10-CM | POA: Diagnosis not present

## 2017-12-12 DIAGNOSIS — I5032 Chronic diastolic (congestive) heart failure: Secondary | ICD-10-CM | POA: Diagnosis not present

## 2017-12-12 DIAGNOSIS — I10 Essential (primary) hypertension: Secondary | ICD-10-CM

## 2017-12-12 DIAGNOSIS — E119 Type 2 diabetes mellitus without complications: Secondary | ICD-10-CM

## 2017-12-12 DIAGNOSIS — I130 Hypertensive heart and chronic kidney disease with heart failure and stage 1 through stage 4 chronic kidney disease, or unspecified chronic kidney disease: Secondary | ICD-10-CM | POA: Diagnosis not present

## 2017-12-12 DIAGNOSIS — E875 Hyperkalemia: Secondary | ICD-10-CM | POA: Diagnosis not present

## 2017-12-12 NOTE — Telephone Encounter (Signed)
Scheduled appt per 2/14 los - Gave patient AVS and calender per los.  

## 2017-12-28 ENCOUNTER — Encounter: Payer: Self-pay | Admitting: Family Medicine

## 2017-12-30 ENCOUNTER — Telehealth: Payer: Self-pay | Admitting: Family Medicine

## 2017-12-30 NOTE — Telephone Encounter (Signed)
Copied from [place]. Topic: Quick Communication - Rx Refill/Question >> Dec 30, 2017 12:06 PM Lolita Rieger, RMA wrote: Medication: insulin NPH humalin    Has the patient contacted their pharmacy? yes   (Agent: If no, request that the patient contact the pharmacy for the refill.)   Preferred Pharmacy (with phone number or street name): [place]   Agent: Please be advised that RX refills may take up to 3 business days. We ask that you follow-up with your pharmacy.     Pt stated that he will be out of medication tomorrow

## 2017-12-31 ENCOUNTER — Other Ambulatory Visit: Payer: Self-pay

## 2017-12-31 MED ORDER — INSULIN ISOPHANE HUMAN 100 UNIT/ML KWIKPEN: PEN_INJECTOR | SUBCUTANEOUS | 3 refills | Status: DC

## 2018-01-05 DIAGNOSIS — J449 Chronic obstructive pulmonary disease, unspecified: Secondary | ICD-10-CM | POA: Diagnosis not present

## 2018-01-10 DIAGNOSIS — Z0279 Encounter for issue of other medical certificate: Secondary | ICD-10-CM

## 2018-01-15 ENCOUNTER — Other Ambulatory Visit: Payer: Self-pay | Admitting: Family Medicine

## 2018-01-16 NOTE — Telephone Encounter (Signed)
[name]  RF request for clonazepam  LOV: 10/25/17 Next ov: 01/22/18 Last written: 07/29/17 #60 w/ 5RF  Please advise. Thanks.   I have sent in refill for citalopram #90 w/ 1RF.

## 2018-01-16 NOTE — Telephone Encounter (Signed)
Rx faxed

## 2018-01-22 ENCOUNTER — Encounter: Payer: Self-pay | Admitting: Family Medicine

## 2018-01-22 ENCOUNTER — Other Ambulatory Visit: Payer: Self-pay | Admitting: Family Medicine

## 2018-01-22 ENCOUNTER — Ambulatory Visit: Payer: Medicare Other | Admitting: Family Medicine

## 2018-01-22 VITALS — BP 111/72 | HR 88 | Temp 97.0°F | Resp 16 | Ht 68.0 in | Wt 291.4 lb

## 2018-01-22 DIAGNOSIS — J9611 Chronic respiratory failure with hypoxia: Secondary | ICD-10-CM | POA: Diagnosis not present

## 2018-01-22 DIAGNOSIS — E118 Type 2 diabetes mellitus with unspecified complications: Secondary | ICD-10-CM | POA: Diagnosis not present

## 2018-01-22 DIAGNOSIS — R740 Nonspecific elevation of levels of transaminase and lactic acid dehydrogenase [LDH]: Secondary | ICD-10-CM

## 2018-01-22 DIAGNOSIS — R7401 Elevation of levels of liver transaminase levels: Secondary | ICD-10-CM

## 2018-01-22 DIAGNOSIS — Z794 Long term (current) use of insulin: Secondary | ICD-10-CM | POA: Diagnosis not present

## 2018-01-22 DIAGNOSIS — J90 Pleural effusion, not elsewhere classified: Secondary | ICD-10-CM | POA: Diagnosis not present

## 2018-01-22 DIAGNOSIS — J449 Chronic obstructive pulmonary disease, unspecified: Secondary | ICD-10-CM

## 2018-01-22 DIAGNOSIS — I5032 Chronic diastolic (congestive) heart failure: Secondary | ICD-10-CM

## 2018-01-22 LAB — COMPREHENSIVE METABOLIC PANEL
ALT: 57 U/L — ABNORMAL HIGH (ref 0–53)
AST: 86 U/L — AB (ref 0–37)
Albumin: 3.7 g/dL (ref 3.5–5.2)
Alkaline Phosphatase: 63 U/L (ref 39–117)
BUN: 18 mg/dL (ref 6–23)
CALCIUM: 9.2 mg/dL (ref 8.4–10.5)
CHLORIDE: 96 meq/L (ref 96–112)
CO2: 33 meq/L — AB (ref 19–32)
CREATININE: 1.14 mg/dL (ref 0.40–1.50)
GFR: 67 mL/min (ref >60.00)
GLUCOSE: 134 mg/dL — AB (ref 70–99)
Potassium: 4.3 mEq/L (ref 3.5–5.1)
SODIUM: 137 meq/L (ref 135–145)
Total Bilirubin: 0.9 mg/dL (ref 0.2–1.2)
Total Protein: 7.2 g/dL (ref 6.0–8.3)

## 2018-01-22 LAB — HEMOGLOBIN A1C: Hgb A1c MFr Bld: 6.5 % (ref 4.6–6.5)

## 2018-01-22 LAB — MICROALBUMIN / CREATININE URINE RATIO
Creatinine,U: 128.9 mg/dL
MICROALB UR: 6.7 mg/dL — AB (ref 0.0–1.9)
Microalb Creat Ratio: 5.2 mg/g (ref 0.0–30.0)

## 2018-01-22 NOTE — Progress Notes (Signed)
OFFICE VISIT  01/22/2018   CC:  Chief Complaint  Patient presents with  . Follow-up    RCI, pt is fasting.     HPI:    Patient is a 72 y.o. Caucasian male who presents accompanied by his son for 3 mo f/u DM 2, CRI with GFR around 60 ml/min, chronic hypoxemia RF (obesity hypoventilation, COPD, chronic diastolic HF). Has advanced esophageal cancer and has completed palliative chemo--on observation now.  Most recent surveillance CT showed no sign of dz progression or recurrence 11/2017.  Has had bilat pleural effusions relatively recently, was put on lasix 27m qd and this has helped pt feel better with breathing, esp says he can lay on back now w/out SOB.  CT C/A/P with contrast 12/05/17: IMPRESSION: 1. Stable exam. No esophageal mass or mediastinal adenopathy identified. No evidence for metastatic disease to the chest, abdomen and pelvis. 2. Cirrhosis with ascites. 3. Persistent bilateral pleural effusions, right greater than left. 4. Aortic Atherosclerosis (ICD10-I70.0). Three vessel coronary artery atherosclerotic calcifications noted. 5. Thoracic and lumbar spondylosis and advanced right glenohumeral joint arthropathy. 6. Gallstones.  DM 2: taking 16 U bid of humulin N and 18 U humalog with BF and supper. Avg fasting and 2H PP around 140s.  Occ reading in mid 200s when noncompliant with diet. NO glucose <90---no sx's of hypoglycemia.    Past Medical History:  Diagnosis Date  . Asthma    as a child  . CAD in native artery    a. cardiac cath 09/2013 showed severely diseased mLAD and diagonal, mild LCx disease, moderate RCA disease, LVEDP 20, EF 50%.  . Cataract    multiple types, bilateral  . Cholelithiasis without cholecystitis   . Chronic diastolic CHF (congestive heart failure) ([facility] 02/25/2009  . Chronic renal insufficiency, stage III (moderate) (HCC) 2017   Stage II/III (GFR around 60)  . 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  CT 11/2017 w/ascites  and persistent bilat pleural effusions  . 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 COPD (chronic obstructive pulmonary disease) ([facility]   . Coronary artery disease    chronically occluded LAD and diagonal with right to left collaterals, mild disease in the left circ and moderate disease in the mid RCA on medical management with Imdur, ASA, and Plavix.  . Dermatitis 05/2014  . DIABETES MELLITUS, TYPE II 06/24/2007   No diab retpthy as of 08/05/15 eye exam.  . [place]ophageal cancer ([facility] 04/2016   poorly differentiated carcinoma (Dr. Pyrtle--EGD).  CT C/A/P showed metastatic adenopathy in mediastinum and upper abdomen 05/09/16.  Tx plan is palliative radiation (completed 06/22/16), then palliative systemic chemotherapy (carbo+taxol) was started but as of 08/03/16 onc f/u this was held due to severe knee and ankle arthralgias.  Restarting as of 10/2015 (08/2016 CT showed some disease regression  . [place]ophageal cancer [place]    CT 12/2016 showed no residual esoph mass--plan to continue chemo.  Ongoing palliative chemo with carboplatin and taxol q 2 wks as of 03/2017 onc f/u (CT 12/2016 and 04/2017 showed no progression of dz).  Taking a 2 mo break from chemo as of 05/2017.  Pleural effusion-- improved with lasix but onc wanted him to get dx/therap thoracentesis-pt declined.  CT C/A/P no progression/recurr/mets 11/2017.  .Marland KitchenEssential hypertension 05/01/2007   Qualifier: Diagnosis of  By: ATiney RougeCMA, LEllison Hughs    . History of kidney stones   . Hyperkalemia 12/2015  Decreased ACE-I by 50% in response, then potassium normalized.  Marland Kitchen HYPERLIPIDEMIA 06/24/2007  . Hypoxemia 01/27/2010  . Morbid obesity ([place])   . Myocardial infarction [facility])    pt states he was informed per MD that he has had one but pt was unaware   . Obesity hypoventilation syndrome (HCC)    oxygen 24/7 (2 liters [place] as of 02/2016)  . On home oxygen therapy    Oxygen @ 2l/m nasally 24/7 hours  . OSA  (obstructive sleep apnea)    not tested; pt scored 4 per stop bang tool results sent to PCP   . OSTEOARTHRITIS 05/01/2007  . Pancytopenia due to chemotherapy ([place]) 2018  . Pleural effusion on right 08/2017   ? malignant vs CHF: pt refuses diagnostic thoracentesis b/c he states he feels fine, wants to try diuretic first.  . Pleural effusion on right 09/2017   Thoracentesis  . Presbycusis of both ears 05/2015   [facility]  . Pruritic condition 05/2014   Allergist summer 2015, no new testing.  Marland Kitchen RBBB   . Visual field defect 05/10/2016   Per Dr. Melina Fiddler, O.D.: Rt, loss of inf/temp quad and some loss sup/temp.  ?CVA  ? Pituitary tumor? 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    Past Surgical History:  Procedure Laterality Date  . APPENDECTOMY    . BALLOON DILATION N/A 05/04/2016   Procedure: BALLOON DILATION;  Surgeon: Jerene Bears, MD;  Location: [facility];  Service: Gastroenterology;  Laterality: N/A;  . CARDIAC CATHETERIZATION    . CARPAL TUNNEL RELEASE Left   . CATARACT EXTRACTION W/PHACO Right 04/29/2013   Procedure: CATARACT EXTRACTION PHACO AND INTRAOCULAR LENS PLACEMENT (IOC);  Surgeon: Adonis Brook, MD;  Location: [facility];  Service: Ophthalmology;  Laterality: Right;  . CATARACT EXTRACTION, BILATERAL    . COLONOSCOPY W/ POLYPECTOMY  08/2011   Many polyps--all hyperplastic, severe diverticulosis, int hem.  [employer] hemoccult testing via [facility] 06/22/15 NEG  . ESOPHAGOGASTRODUODENOSCOPY N/A 05/04/2016   Procedure: ESOPHAGOGASTRODUODENOSCOPY (EGD);  Surgeon: Jerene Bears, MD;  Location: [facility];  Service: Gastroenterology;  Laterality: N/A;  . IR GENERIC HISTORICAL  10/03/2016   IR US GUIDE VASC ACCESS RIGHT 10/03/2016 Greggory Keen, MD [facility]  . IR GENERIC HISTORICAL  10/03/2016   IR FLUORO GUIDE PORT INSERTION RIGHT 10/03/2016 Greggory Keen, MD [facility]  . KNEE ARTHROSCOPY WITH MEDIAL MENISECTOMY Right 12/16/2014   Procedure: RIGHT KNEE  ARTHROSCOPY WITH MEDIAL MENISECTOMY microfracture medial femoral condyle abrasion condroplasty medial femoral condyle lateral menisectomy;  Surgeon: Tobi Bastos, MD;  Location: [facility];  Service: Orthopedics;  Laterality: Right;  . LEFT HEART CATHETERIZATION WITH CORONARY ANGIOGRAM N/A 10/26/2013   Procedure: LEFT HEART CATHETERIZATION WITH CORONARY ANGIOGRAM;  Surgeon: Jettie Booze, MD;  Location: [facility] CATH LAB;  Service: Cardiovascular;  Laterality: N/A;  . LUMBAR [place] SURGERY  2001  . SHOULDER SURGERY Right    right fx  . TONSILLECTOMY    . TRANSTHORACIC ECHOCARDIOGRAM  03/2016   EF 55-60%, grade I DD, LAE  . WRIST SURGERY Right    right fx    Outpatient Medications Prior to Visit  Medication Sig Dispense Refill  . aspirin 81 MG tablet Take 81 mg by mouth daily.      Marland Kitchen atorvastatin (LIPITOR) 80 MG tablet TAKE 1 TABLET BY MOUTH ONCE DAILY 90 tablet 3  . baclofen (LIORESAL) 10 MG tablet 1/2 - 1 tab po q8h prn muscle  spasms 30 each 1  . beta carotene w/minerals (OCUVITE) tablet Take 1 tablet by mouth daily.    . cetirizine (ZYRTEC) 10 MG tablet Take 10 mg by mouth daily.    . citalopram (CELEXA) 20 MG tablet TAKE 1 TABLET BY MOUTH ONCE DAILY 90 tablet 1  . clonazePAM (KLONOPIN) 1 MG tablet TAKE 1 TABLET BY MOUTH TWICE DAILY AS NEEDED FOR  ANXIETY 60 tablet 5  . clotrimazole-betamethasone (LOTRISONE) cream Apply to affected area twice daily 45 g 2  . cromolyn (OPTICROM) 4 % ophthalmic solution     . furosemide (LASIX) 20 MG tablet Take 1 tablet (20 mg total) by mouth daily. 90 tablet 1  . gabapentin (NEURONTIN) 300 MG capsule TAKE ONE CAPSULE BY MOUTH THREE TIMES DAILY 270 capsule 3  . HUMULIN N KWIKPEN 100 UNIT/ML Kiwkpen INJECT 16 UNITS EVERY MORNING AND 16 UNITS EVERY EVENING 15 mL 3  . hydrOXYzine (ATARAX/VISTARIL) 25 MG tablet TAKE 1 TABLET BY MOUTH AT SUPPER AND 1 TAB AT BEDTIME FOR INSOMNIA 60 tablet 6  . ibuprofen (ADVIL,MOTRIN) 200 MG tablet Take 400 mg by mouth every 6  (six) hours as needed.    . magnesium chloride (SLOW-MAG) 64 MG TBEC SR tablet Take 1 tablet (64 mg total) by mouth daily. 60 tablet 0  . metFORMIN (GLUCOPHAGE) 1000 MG tablet TAKE 1 TABLET BY MOUTH TWICE DAILY WITH MEALS 180 tablet 1  . Omega-3 Fatty Acids ([employer] FISH OIL) 1000 MG CAPS Take 2 tablets by mouth 2 (two) times daily. 90 capsule   . ONE TOUCH ULTRA TEST test strip USE  STRIP TO CHECK GLUCOSE THREE TIMES DAILY AS  DIRECTED 100 each 11  . ONETOUCH DELICA LANCETS 96V MISC USE ONE  TO CHECK GLUCOSE THREE TIMES DAILY 100 each 11  . oxyCODONE (OXY IR/ROXICODONE) 5 MG immediate release tablet Take 1 tablet (5 mg total) by mouth every 8 (eight) hours as needed for severe pain. 30 tablet 0  . OXYGEN Inhale 2 L/min into the lungs continuous.    . pantoprazole (PROTONIX) 40 MG tablet Take 1 tablet (40 mg total) by mouth 2 (two) times daily. 60 tablet 6  . ezetimibe (ZETIA) 10 MG tablet Take 1 tablet (10 mg total) by mouth daily. 90 tablet 3  . Insulin Lispro (HUMALOG KWIKPEN) 200 UNIT/ML SOPN Inject 20 Units into the skin 2 (two) times daily with a meal. (Patient taking differently: Inject 16 Units into the skin 2 (two) times daily with a meal. Takes  16 units  Twice  Daily.) 18 pen 6  . Insulin NPH, Human,, Isophane, (HUMULIN N) 100 UNIT/ML Kiwkpen 16 units every morning and 16 units every evening 15 mL 3   Facility-Administered Medications Prior to Visit  Medication Dose Route Frequency Provider Last Rate Last Dose  . sodium chloride flush (NS) 0.9 % injection 10 mL  10 mL Intravenous PRN Truitt Merle, MD   10 mL at 04/16/17 1459  . sodium chloride flush (NS) 0.9 % injection 10 mL  10 mL Intravenous PRN Truitt Merle, MD   10 mL at 05/02/17 1725    No Known Allergies  ROS As per HPI  PE: Blood pressure 111/72, pulse 88, temperature (!) 97 F (36.1 C), temperature source Oral, resp. rate 16, height '5\' 8"'  (1.727 m), weight 291 lb 6 oz (132.2 kg), SpO2 97 %.2L oxygen [place] Gen: Alert, chronically ill  appearing but NAD  Patient is oriented to person, place, time, and situation. AFFECT: pleasant, lucid thought  and speech. CV: RRR, distant S1 and S2, no audible m/r. LUNGS: markedly diminished aeration on inspiration in both bases, mild e-->a changes, no wheezing, no prolongation of exp phase.  NOnlabored resps. EXT: trace to 1+ pitting R ankle, no pitting L LL. Foot exam -  no swelling, tenderness or skin or vascular lesions. Color and temperature is normal. Sensation is intact to touch with hand but not to monofilament testing.  Peripheral pulses are palpable. Toenails are normal.     LABS:  Lab Results  Component Value Date   TSH 2.940 08/28/2016   Lab Results  Component Value Date   WBC 5.4 12/05/2017   HGB 11.4 (L) 12/05/2017   HCT 34.9 (L) 12/05/2017   MCV 94.0 12/05/2017   PLT 116 (L) 12/05/2017   Lab Results  Component Value Date   CREATININE 1.32 (H) 12/05/2017   BUN 16 12/05/2017   NA 137 12/05/2017   K 4.0 12/05/2017   CL 94 (L) 12/05/2017   CO2 31 (H) 12/05/2017   Lab Results  Component Value Date   ALT 84 (H) 12/05/2017   AST 117 (H) 12/05/2017   ALKPHOS 79 12/05/2017   BILITOT 1.0 12/05/2017   Lab Results  Component Value Date   CHOL 96 (L) 11/28/2016   Lab Results  Component Value Date   HDL 37 (L) 11/28/2016   Lab Results  Component Value Date   LDLCALC 25 11/28/2016   Lab Results  Component Value Date   TRIG 168 (H) 11/28/2016   Lab Results  Component Value Date   CHOLHDL 2.6 11/28/2016   Lab Results  Component Value Date   HGBA1C 6.8 10/25/2017    IMPRESSION AND PLAN:  1) DM 2;  Historically well controlled. HbA1c and urine microalbumin today as well as feet exam. Lytes/cr today. No changes in meds at this time.  2) CRI stage II/III: avoid frequent NSAIDs, hydrate adequately.  Lytes/cr today.  3) Chronic hypoxic resp failure (obesity hypoventilation syndrome, COPD, chronic diastolic HF). Stable.  He feels like he is at  baseline resp status. BP good today.  Will continue 58m qd lasix, oxygen supplementation.  4) Metastatic esophageal adenocarcinoma: currently on a break from palliative chemo. Most recent CT 11/2017 showed no sign of recurrence/progression of dz. He does have a pleural effusion (malignant? Transudate from CHF?), was recommended to have diagnostic/therapeutic thoracentesis but he declined.   Plan is to continue 272mlasix qd and   An After Visit Summary was printed and given to the patient.  FOLLOW UP: 3 mo [facility] f/u.  Signed:  PhCrissie SicklesMD           01/30/2018

## 2018-01-22 NOTE — Telephone Encounter (Signed)
Copied from [place]. Topic: Quick Communication - Rx Refill/Question >> Jan 22, 2018 11:44 AM Oliver Pila B wrote: Medication: clonazePAM (KLONOPIN) 1 MG tablet [[id number]]  Has the patient contacted their pharmacy? Yes.   (Agent: If no, request that the patient contact the pharmacy for the refill.) Preferred Pharmacy (with phone number or street name): [facility] Agent: Please be advised that RX refills may take up to 3 business days. We ask that you follow-up with your pharmacy.

## 2018-01-22 NOTE — Telephone Encounter (Signed)
LOV 01/23/28 Dr. Williemae Natter

## 2018-01-24 ENCOUNTER — Telehealth: Payer: Self-pay

## 2018-01-24 NOTE — Telephone Encounter (Signed)
Copied from [place] [id number]. Topic: Quick Communication - See Telephone Encounter >> Jan 24, 2018 10:14 AM Synthia Innocent wrote: CRM for notification. See Telephone encounter for: 01/24/18. Patient states Dr Anitra Lauth wanted him to get a chest xray, please advise

## 2018-01-24 NOTE — Telephone Encounter (Signed)
Returned call to patient, message left on voice mail to return call.

## 2018-01-24 NOTE — Telephone Encounter (Signed)
Sorry, I didn't communicate clearly to him.  I was just thinking out loud when I said I was considering repeating an x-ray, but I decided that this is not needed at this time.  Reassure pt.-thx

## 2018-01-24 NOTE — Telephone Encounter (Signed)
Patient checking status, will run out tomorrow.

## 2018-01-24 NOTE — Telephone Encounter (Signed)
Okay for [facility] nurse to give information.

## 2018-01-24 NOTE — Telephone Encounter (Signed)
This medication was faxed to pts pharmacy on 01/16/18. SW Judson Roch at [facility] and she stated that they did receive Rx and will get it ready for pt.  Pt advised and voiced understanding.

## 2018-02-05 DIAGNOSIS — J449 Chronic obstructive pulmonary disease, unspecified: Secondary | ICD-10-CM | POA: Diagnosis not present

## 2018-02-07 NOTE — Progress Notes (Signed)
[place]  [phone] [phone]  Clinic Follow up Note   Patient Care Team: Tammi Sou, MD as PCP - General (Family Medicine) Harold Hedge, Darrick Grinder, MD as Consulting Physician (Allergy and Immunology) Shirley Muscat Loreen Freud, MD as Consulting Physician (Optometry) Sueanne Margarita, MD as Consulting Physician (Cardiology) Pyrtle, Lajuan Lines, MD as Consulting Physician (Gastroenterology) Latanya Maudlin, MD as Consulting Physician (Orthopedic Surgery) Jodi Marble, MD as Consulting Physician (Otolaryngology) Truitt Merle, MD as Consulting Physician (Hematology) Kyung Rudd, MD as Consulting Physician (Radiation Oncology)   Date of Service: 02/10/2018     CHIEF COMPLAINTS:  Follow up GE junction cancer  Oncology History   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 The mass was not obstructing and partially circumferential, extends approximately 5 cm. Nonbleeding erosive gastropathy.      05/04/2016 Initial Biopsy    Esophageal gastric junction biopsy showed a poorly differentiated carcinoma underlying the squamous mucosa. There is lymphovascular invasion. No Intestinal metaplasia. IHC weakly positive CK5/6, p63 (-), favor squamous.        05/09/2016 Imaging    CT CAP w contrast showed mild wall thickening involving the distal esophagus and proximal stomach compatible with known cancer, enlarged mediastinal and upper abdominal lymph nodes are highly suspicious for metastatic adenopathy, propable liver cirrhosis.      06/04/2016 -  06/22/2016 Radiation Therapy    palliative radiation to esophageal cancer       07/06/2016 - 06/20/2017 Chemotherapy    chemotherapy with weekly carboplatin and taxol, started on 07/06/2016, held 08/21/16-09/28/2016 due to hospitalization, changed to every 2 weeks from 11/30/2016. Chemo stopped due to no evidence of disease on restaging scan.      07/30/2016 Pathology Results    PD-L1 negative expression      08/28/2016 - 08/31/2016 Hospital Admission    The patient was admitted to 4 severe hypoglycemia, dehydration, and acute renal failure. Infection workup was negative. He recovered well with supportive care. His insulin and hypertension medication was held on discharge, except insulin sliding scale.      09/24/2016 Imaging    CT CAP W CONTRAST 09/26/2016 IMPRESSION: Decreased masslike soft tissue prominence at gastroesophageal junction, consistent with decreased size of primary gastroesophageal junction carcinoma. Resolution of paraesophageal and gastrohepatic ligament lymphadenopathy since prior exam. Other sub-cm mediastinal lymph nodes show little or no significant change. Stable indeterminate sub-cm low-attenuation lesion in the liver dome. Probable hepatic cirrhosis. Recommend continued attention on follow-up CT. No new or progressive metastatic disease identified. Cholelithiasis.  No radiographic evidence of cholecystitis. Colonic diverticulosis. No radiographic evidence of diverticulitis. Aortic atherosclerosis and three-vessel coronary artery calcification.      01/07/2017 Imaging    CT CAP w contrast 1. No residual esophageal mass or adenopathy. 2. New nodular airspace opacification in the left lower lobe. Continued attention on followup exams is warranted as metastatic disease cannot be definitively excluded. 3. Cirrhosis. 4. Trace left pleural effusion. 5. Aortic atherosclerosis (ICD10-170.0). Coronary artery calcification. 6. Cholelithiasis. 7. Mild basilar predominant  subpleural  reticular densities. Difficult to exclude interstitial lung disease.      04/29/2017 Imaging    CT CAP w contrast  IMPRESSION: 1. No evidence of metastatic disease. 2. Nodular airspace disease previously seen in the left lower lobe has resolved in the interval. 3. Tiny right pleural effusion, new. Small left pleural effusion, slightly increased. 4. Cirrhosis with splenomegaly. 5.  Aortic atherosclerosis (ICD10-170.0).      09/05/2017 Imaging    CT CAP W Contrast 09/05/17  IMPRESSION: 1. Currently no esophageal mass is visible, nor is there significant mediastinal adenopathy. There is hepatic cirrhosis but no compelling findings of hepatic metastatic disease. 2. Large right pleural effusion is increased from the prior exam. Small stable left pleural effusion. 3. Small but increased amount of ascites and mesenteric edema compared to prior. 4. Other imaging findings of potential clinical significance: Aortic Atherosclerosis (ICD10-I70.0). Coronary atherosclerosis. The markedly severe right glenohumeral arthropathy. Thoracic and lumbar spondylosis with lower lumbar multilevel impingement, and notable intervertebral spurring at T12-L1. Cholelithiasis. Degenerative arthropathy of both hips.       10/21/2017 Imaging    Chest X-Ray IMPRESSION: Small bilateral effusions, decreased on the right since prior CT.      12/05/2017 Imaging    CT CAP W Contrast 12/05/17 IMPRESSION: 1. Stable exam. No esophageal mass or mediastinal adenopathy identified. No evidence for metastatic disease to the chest, abdomen and pelvis. 2. Cirrhosis with ascites. 3. Persistent bilateral pleural effusions, right greater than left. 4. Aortic Atherosclerosis (ICD10-I70.0). Three vessel coronary artery atherosclerotic calcifications noted. 5. Thoracic and lumbar spondylosis and advanced right glenohumeral joint arthropathy. 6. Gallstones.       HISTORY OF PRESENTING ILLNESS:  [name] SALEMI  72 y.o. male is here because of His reason that diagnosed GE junction carcinoma. He is a comment of by his wife and daughter to our multidisciplinary chart clinic today.  He has been having progressive dysphagia and odynophagia for 2 month, he has been eating soft diet only in the past one week. He has pain in the mid chest and epigastric area only when he eats, with burning sensation, no nausea, abdominal bloating, or other discomfort. His appetite has remained well, no weight loss,   He has multiple medical problems, especially heart failure, he has been on oxygen continuously for 5-6 years. He is morbidly obese, has diabetes, hypertension, mild neuropathy, OSA etc. He has very sedentary life style, he sits in the chair and watch TV most of time during the day. He only comes out for shopping for a few times a week. He uses a Barr[employer], he can walk for a few hundreds feet before he has to stop to catch his breasts. He denies cough or sputum production, no GI discomfort, he has been having mild diarrhea, loose stool, twice daily, no melena or hematochezia.  CURRENT THERAPY: Observation  INTERIM HISTORY:  OSHAE SIMMERING returns for follow-up. He presents to the clinic on continuous O2 and accompanied by his son. He reports he is doing well overall. He states his breathing is pretty normal and he is not having any problems. He notes he has had some cramps in his hands and legs but it is manageable. He endorses a good appetite.   On review of systems, pt denies trouble swallowing, or any other complaints at this time. Pertinent positives are listed and detailed within the above HPI.   MEDICAL HISTORY:  Past Medical History:  Diagnosis Date  . Asthma    as a  child  . CAD in native artery    a. cardiac cath 09/2013 showed severely diseased mLAD and diagonal, mild LCx disease, moderate RCA disease, LVEDP 20, EF 50%.  . Cataract    multiple types, bilateral  . Cholelithiasis without  cholecystitis   . Chronic diastolic CHF (congestive heart failure) ([place]) 02/25/2009  . Chronic renal insufficiency, stage III (moderate) (HCC) 2017   Stage II/III (GFR around 60)  . 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  CT 11/2017 w/ascites and persistent bilat pleural effusions  . 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 COPD (chronic obstructive pulmonary disease) ([place])   . Coronary artery disease    chronically occluded LAD and diagonal with right to left collaterals, mild disease in the left circ and moderate disease in the mid RCA on medical management with Imdur, ASA, and Plavix.  . Dermatitis 05/2014  . DIABETES MELLITUS, TYPE II 06/24/2007   No diab retpthy as of 08/05/15 eye exam.  . 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 Pyrtle--EGD).  CT C/A/P showed metastatic adenopathy in mediastinum and upper abdomen 05/09/16.  Tx plan is palliative radiation (completed 06/22/16), then palliative systemic chemotherapy (carbo+taxol) was started but as of 08/03/16 onc f/u this was held due to severe knee and ankle arthralgias.  Restarting as of 10/2015 (08/2016 CT showed some disease regression  . Esophageal cancer ([place])    CT 12/2016 showed no residual esoph mass--plan to continue chemo.  Ongoing palliative chemo with carboplatin and taxol q 2 wks as of 03/2017 onc f/u (CT 12/2016 and 04/2017 showed no progression of dz).  Taking a 2 mo break from chemo as of 05/2017.  Pleural effusion-- improved with lasix but onc wanted him to get dx/therap thoracentesis-pt declined.  CT C/A/P no progression/recurr/mets 11/2017.  Marland Kitchen Essential hypertension 05/01/2007   Qualifier: Diagnosis of  By: Tiney Rouge CMA, Ellison Hughs     . History of kidney stones   . Hyperkalemia 12/2015   Decreased ACE-I by 50% in response, then potassium normalized.  Marland Kitchen HYPERLIPIDEMIA 06/24/2007  . Hypoxemia 01/27/2010  . Morbid obesity ([place])   .  Myocardial infarction [facility])    pt states he was informed per MD that he has had one but pt was unaware   . Obesity hypoventilation syndrome (HCC)    oxygen 24/7 (2 liters [place] as of 02/2016)  . On home oxygen therapy    Oxygen @ 2l/m nasally 24/7 hours  . OSA (obstructive sleep apnea)    not tested; pt scored 4 per stop bang tool results sent to PCP   . OSTEOARTHRITIS 05/01/2007  . Pancytopenia due to chemotherapy ([place]) 2018  . Pleural effusion on right 08/2017   ? malignant vs CHF: pt refuses diagnostic thoracentesis b/c he states he feels fine, wants to try diuretic first.  . Pleural effusion on right 09/2017   Thoracentesis  . Presbycusis of both ears 05/2015   [facility]  . Pruritic condition 05/2014   Allergist summer 2015, no new testing.  Marland Kitchen RBBB   . Visual field defect 05/10/2016   Per Dr. Melina Fiddler, O.D.: Rt, loss of inf/temp quad and some loss sup/temp.  ?CVA  ? Pituitary tumor? 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    SURGICAL HISTORY: Past Surgical History:  Procedure Laterality Date  . APPENDECTOMY    . BALLOON DILATION N/A  05/04/2016   Procedure: BALLOON DILATION;  Surgeon: Jerene Bears, MD;  Location: [facility];  Service: Gastroenterology;  Laterality: N/A;  . CARDIAC CATHETERIZATION    . CARPAL TUNNEL RELEASE Left   . CATARACT EXTRACTION W/PHACO Right 04/29/2013   Procedure: CATARACT EXTRACTION PHACO AND INTRAOCULAR LENS PLACEMENT (IOC);  Surgeon: Adonis Brook, MD;  Location: [place];  Service: Ophthalmology;  Laterality: Right;  . CATARACT EXTRACTION, BILATERAL    . COLONOSCOPY W/ POLYPECTOMY  08/2011   Many polyps--all hyperplastic, severe diverticulosis, int hem.  [employer] hemoccult testing via [facility] 06/22/15 NEG  . ESOPHAGOGASTRODUODENOSCOPY N/A 05/04/2016   Procedure: ESOPHAGOGASTRODUODENOSCOPY (EGD);  Surgeon: Jerene Bears, MD;  Location: [facility];  Service: Gastroenterology;  Laterality: N/A;  . IR GENERIC HISTORICAL  10/03/2016    IR US GUIDE VASC ACCESS RIGHT 10/03/2016 Greggory Keen, MD [facility]  . IR GENERIC HISTORICAL  10/03/2016   IR FLUORO GUIDE PORT INSERTION RIGHT 10/03/2016 Greggory Keen, MD [facility]  . KNEE ARTHROSCOPY WITH MEDIAL MENISECTOMY Right 12/16/2014   Procedure: RIGHT KNEE ARTHROSCOPY WITH MEDIAL MENISECTOMY microfracture medial femoral condyle abrasion condroplasty medial femoral condyle lateral menisectomy;  Surgeon: Tobi Bastos, MD;  Location: [facility];  Service: Orthopedics;  Laterality: Right;  . LEFT HEART CATHETERIZATION WITH CORONARY ANGIOGRAM N/A 10/26/2013   Procedure: LEFT HEART CATHETERIZATION WITH CORONARY ANGIOGRAM;  Surgeon: Jettie Booze, MD;  Location: [facility] C[facility];  Service: Cardiovascular;  Laterality: N/A;  . LUMBAR [place] SURGERY  2001  . SHOULDER SURGERY Right    right fx  . TONSILLECTOMY    . TRANSTHORACIC ECHOCARDIOGRAM  03/2016   EF 55-60%, grade I DD, LAE  . WRIST SURGERY Right    right fx    SOCIAL HISTORY: Social History   Socioeconomic History  . Marital status: Married    Spouse name: susan  . Number of children: 2  . Years of education: Not on file  . Highest education level: Not on file  Occupational History  . Occupation: Retired  Scientific laboratory technician  . Financial resource strain: Not on file  . Food insecurity:    Worry: Not on file    Inability: Not on file  . Transportation needs:    Medical: Not on file    Non-medical: Not on file  Tobacco Use  . Smoking status: Former Smoker    Packs/day: 1.50    Years: 30.00    Pack years: 45.00    Types: Cigarettes, Pipe, Cigars    Last attempt to quit: 10/30/1983    Years since quitting: 34.3  . Smokeless tobacco: Former Systems developer    Types: [place] date: 05/15/2016  Substance and Sexual Activity  . Alcohol use: No  . Drug use: No  . Sexual activity: Not Currently  Lifestyle  . Physical activity:    Days per week: Not on file    Minutes per session: Not on file  . Stress: Not on file    Relationships  . Social connections:    Talks on phone: Not on file    Gets together: Not on file    Attends religious service: Not on file    Active member of club or organization: Not on file    Attends meetings of clubs or organizations: Not on file    Relationship status: Not on file  . Intimate partner violence:    Fear of current or ex partner: Not on file    Emotionally abused: Not on file  Physically abused: Not on file    Forced sexual activity: Not on file  Other Topics Concern  . Not on file  Social History Narrative   Married, one son and one daughter.   His daughter and her two children live with him.   Coffee daily.  Former smoker.  No alcohol.   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   Attends church weekly-   Oxygen continuous    FAMILY HISTORY: Family History  Problem Relation Age of Onset  . Heart attack Mother   . Aneurysm Father   . Alcohol abuse Father   . Diabetes Maternal Aunt   . Colon cancer Neg Hx     ALLERGIES:  has No Known Allergies.  MEDICATIONS:  Current Outpatient Medications  Medication Sig Dispense Refill  . aspirin 81 MG tablet Take 81 mg by mouth daily.      Marland Kitchen atorvastatin (LIPITOR) 80 MG tablet TAKE 1 TABLET BY MOUTH ONCE DAILY 90 tablet 3  . baclofen (LIORESAL) 10 MG tablet 1/2 - 1 tab po q8h prn muscle spasms 30 each 1  . beta carotene w/minerals (OCUVITE) tablet Take 1 tablet by mouth daily.    . cetirizine (ZYRTEC) 10 MG tablet Take 10 mg by mouth daily.    . citalopram (CELEXA) 20 MG tablet TAKE 1 TABLET BY MOUTH ONCE DAILY 90 tablet 1  . clonazePAM (KLONOPIN) 1 MG tablet TAKE 1 TABLET BY MOUTH TWICE DAILY AS NEEDED FOR  ANXIETY 60 tablet 5  . clotrimazole-betamethasone (LOTRISONE) cream Apply to affected area twice daily 45 g 2  . cromolyn (OPTICROM) 4 % ophthalmic solution     . ezetimibe (ZETIA) 10 MG tablet Take 1 tablet (10 mg total) by mouth daily. 90 tablet 3  . furosemide (LASIX) 20 MG  tablet Take 1 tablet (20 mg total) by mouth daily. 90 tablet 1  . gabapentin (NEURONTIN) 300 MG capsule TAKE ONE CAPSULE BY MOUTH THREE TIMES DAILY 270 capsule 3  . HUMULIN N KWIKPEN 100 UNIT/ML Kiwkpen INJECT 16 UNITS EVERY MORNING AND 16 UNITS EVERY EVENING 15 mL 3  . hydrOXYzine (ATARAX/VISTARIL) 25 MG tablet TAKE 1 TABLET BY MOUTH AT SUPPER AND 1 TAB AT BEDTIME FOR INSOMNIA 60 tablet 6  . ibuprofen (ADVIL,MOTRIN) 200 MG tablet Take 400 mg by mouth every 6 (six) hours as needed.    . Insulin Lispro (HUMALOG KWIKPEN) 200 UNIT/ML SOPN Inject 20 Units into the skin 2 (two) times daily with a meal. (Patient taking differently: Inject 16 Units into the skin 2 (two) times daily with a meal. Takes  16 units  Twice  Daily.) 18 pen 6  . Insulin NPH, Human,, Isophane, (HUMULIN N) 100 UNIT/ML Kiwkpen 16 units every morning and 16 units every evening 15 mL 3  . magnesium chloride (SLOW-MAG) 64 MG TBEC SR tablet Take 1 tablet (64 mg total) by mouth daily. 60 tablet 0  . metFORMIN (GLUCOPHAGE) 1000 MG tablet TAKE 1 TABLET BY MOUTH TWICE DAILY WITH MEALS 180 tablet 1  . Omega-3 Fatty Acids ([employer] FISH OIL) 1000 MG CAPS Take 2 tablets by mouth 2 (two) times daily. 90 capsule   . ONE TOUCH ULTRA TEST test strip USE  STRIP TO CHECK GLUCOSE THREE TIMES DAILY AS  DIRECTED 100 each 11  . ONETOUCH DELICA LANCETS 46K MISC USE ONE  TO CHECK GLUCOSE THREE TIMES DAILY 100 each 11  . oxyCODONE (OXY IR/ROXICODONE) 5 MG immediate release tablet Take 1  tablet (5 mg total) by mouth every 8 (eight) hours as needed for severe pain. 30 tablet 0  . OXYGEN Inhale 2 L/min into the lungs continuous.    . pantoprazole (PROTONIX) 40 MG tablet Take 1 tablet (40 mg total) by mouth 2 (two) times daily. 60 tablet 6   No current facility-administered medications for this visit.    Facility-Administered Medications Ordered in Other Visits  Medication Dose Route Frequency Provider Last Rate Last Dose  . sodium chloride flush (NS) 0.9 %  injection 10 mL  10 mL Intravenous PRN Truitt Merle, MD   10 mL at 04/16/17 1459  . sodium chloride flush (NS) 0.9 % injection 10 mL  10 mL Intravenous PRN Truitt Merle, MD   10 mL at 05/02/17 1725  . sodium chloride flush (NS) 0.9 % injection 10 mL  10 mL Intravenous PRN Truitt Merle, MD   10 mL at 02/10/18 1420    REVIEW OF SYSTEMS:  Constitutional: Denies fevers, abnormal night sweats (+) good appetite Eyes: Denies blurriness of vision, double vision or watery eyes Ears, nose, mouth, throat, and face: Denies mucositis or sore throat, Respiratory: Denies cough, wheezes   Cardiovascular: Denies palpitation, chest discomfort or lower extremity swelling Gastrointestinal:  Denies nausea, heartburn or change in bowel habits  Skin: Denies abnormal skin rashes  Lymphatics: Denies new lymphadenopathy or easy bruising Neurological:Denies numbness, tingling  Behavioral/Psych: Mood is stable, no new changes  MSK: (+) arthritis, muscle cramps  All other systems were reviewed with the patient and are negative.  PHYSICAL EXAMINATION:  ECOG PERFORMANCE STATUS: 3 - Symptomatic, >50% confined to bed BP 107/61 (BP Location: Left Arm, Patient Position: Sitting)   Pulse 85   Temp 98.1 F (36.7 C) (Oral)   Resp 16   Ht '5\' 8"'  (1.727 m)   Wt 293 lb 12.8 oz (133.3 kg)   SpO2 93%   BMI 44.67 kg/m   GENERAL:alert, no distress and comfortable, morbid obese, sitting in wheelchair with nasal cannula oxygen  SKIN: skin color, texture, turgor are normal, no rashes or significant lesions EYES: normal, conjunctiva are pink and non-injected, sclera clear OROPHARYNX:no exudate, no erythema and lips, buccal mucosa, and tongue normal  NECK: supple, thyroid normal size, non-tender, without nodularity LYMPH:  no palpable lymphadenopathy in the cervical, axillary or inguinal LUNGS: (+) minimally decreased in breath sounds in the bottom of right lung, improved from last visit (+) Improved b/l pleural effusion R>L HEART:  regular rate & rhythm and no murmurs and no lower extremity edema ABDOMEN:abdomen soft, non-tender and normal bowel sounds Musculoskeletal:no cyanosis of digits and no clubbing  PSYCH: alert & oriented x 3 with fluent speech NEURO: no focal motor/sensory deficits EXT: Trace edema, he wears compression stocks, no significant ankle swollen, erythema, or warmness.   LABORATORY DATA:  I have reviewed the data as listed CBC Latest Ref Rng & Units 02/10/2018 12/05/2017 11/12/2017  WBC 4.0 - 10.3 K/uL 4.8 5.4 5.1  Hemoglobin 13.0 - 17.1 g/dL 10.7(L) 11.4(L) 11.8(L)  Hematocrit 38.4 - 49.9 % 34.6(L) 34.9(L) 38.4  Platelets 140 - 400 K/uL 85(L) 116(L) 96(L)   CMP Latest Ref Rng & Units 02/10/2018 01/22/2018 12/05/2017  Glucose 70 - 140 mg/dL 125 134(H) 134  BUN 7 - 26 mg/dL '13 18 16  ' Creatinine 0.70 - 1.30 mg/dL 1.19 1.14 1.32(H)  Sodium 136 - 145 mmol/L 138 137 137  Potassium 3.5 - 5.1 mmol/L 4.0 4.3 4.0  Chloride 98 - 109 mmol/L 97(L) 96 94(L)  CO2 22 - 29 mmol/L 31(H) 33(H) 31(H)  Calcium 8.4 - 10.4 mg/dL 9.0 9.2 9.4  Total Protein 6.4 - 8.3 g/dL 7.3 7.2 7.7  Total Bilirubin 0.2 - 1.2 mg/dL 0.7 0.9 1.0  Alkaline Phos 40 - 150 U/L 70 63 79  AST 5 - 34 U/L 67(H) 86(H) 117(H)  ALT 0 - 55 U/L 56(H) 57(H) 84(H)   PATHOLOGY REPORT Diagnosis 05/04/2016 1. Esophagogastric junction, biopsy, ulcerative mass - POORLY DIFFERENTIATED CARCINOMA, SEE COMMENT. 2. Stomach, biopsy - REACTIVE GASTROPATHY. - NEGATIVE FOR HELICOBACTER PYLORI. - NO INTESTINAL METAPLASIA, DYSPLASIA, OR MALIGNANCY. Microscopic Comment 1. The majority of the specimen consists of gastroesophageal mucosa with reflux changes. There is a small focus of tumor underlying the squamous mucosa. There is lymphovascular invasion. There is no background intestinal metaplasia (Barrett's esophagus). Immunohistochemistry reveals only very focal weak cytokeratin 5/6, negative p63, and negative mucicarmine in the limited remaining tumor. Dr.  Saralyn Pilar has reviewed the case. The case was called to Dr. Hilarie Fredrickson on 05/08/2016. 2. A Warthin-Starry stain is performed to determine the possibility of the presence of Helicobacter pylori. The Warthin-Starry stain is negative for organisms of Helicobacter pylori.     RADIOGRAPHIC STUDIES: I have personally reviewed the radiological images as listed and agreed with the findings in the report.  CT CAP W Contrast 12/05/17 IMPRESSION: 1. Stable exam. No esophageal mass or mediastinal adenopathy identified. No evidence for metastatic disease to the chest, abdomen and pelvis. 2. Cirrhosis with ascites. 3. Persistent bilateral pleural effusions, right greater than left. 4. Aortic Atherosclerosis (ICD10-I70.0). Three vessel coronary artery atherosclerotic calcifications noted. 5. Thoracic and lumbar spondylosis and advanced right glenohumeral joint arthropathy. 6. Gallstones.   Chest X-Ray 10/21/17 IMPRESSION: Small bilateral effusions, decreased on the right since prior CT.  CT CAP W Contrast 09/05/17  IMPRESSION: 1. Currently no esophageal mass is visible, nor is there significant mediastinal adenopathy. There is hepatic cirrhosis but no compelling findings of hepatic metastatic disease. 2. Large right pleural effusion is increased from the prior exam. Small stable left pleural effusion. 3. Small but increased amount of ascites and mesenteric edema compared to prior. 4. Other imaging findings of potential clinical significance: Aortic Atherosclerosis (ICD10-I70.0). Coronary atherosclerosis. The markedly severe right glenohumeral arthropathy. Thoracic and lumbar spondylosis with lower lumbar multilevel impingement, and notable intervertebral spurring at T12-L1. Cholelithiasis. Degenerative arthropathy of both hips.   CT CAP w contrast 04/29/2017 IMPRESSION: 1. No evidence of metastatic disease. 2. Nodular airspace disease previously seen in the left lower lobe has resolved in the  interval. 3. Tiny right pleural effusion, new. Small left pleural effusion, slightly increased. 4. Cirrhosis with splenomegaly. 5.  Aortic atherosclerosis (ICD10-170.0).   CT CAP w contrast 01/07/2017 IMPRESSION: 1. No residual esophageal mass or adenopathy. 2. New nodular airspace opacification in the left lower lobe. Continued attention on followup exams is warranted as metastatic disease cannot be definitively excluded. 3. Cirrhosis. 4. Trace left pleural effusion. 5. Aortic atherosclerosis (ICD10-170.0). Coronary artery calcification. 6. Cholelithiasis. 7. Mild basilar predominant subpleural reticular densities. Difficult to exclude interstitial lung disease.  EGD 05/04/2016 A large, ulcerating mass with no active bleeding and no stigmata of recent bleeding was found at the gastroesophageal junction extending into the gastric cardia, beginning 40 cm from the incisors. The mass was non-obstructing and partially circumferential (involving one-half of the lumen circumference). The mass extends approximately 5 cm. IMPRESSION:  -Likely malignant esophageal tumor was found at the gastroesophageal junction extending into the gastric cardia. Biopsied. - Non-bleeding erosive gastropathy.  Biopsied. - Erythematous duodenopathy. - Normal second portion of the duodenum.  ASSESSMENT & PLAN: 72 y.o. Caucasian male, with multiple comorbidities, including hypertension, diabetes, OSA, coronary artery disease, diastolic CHF, on continuous oxygen, morbid obesity, presented with progressive dysphagia and odynophagia.  1. Cancer of cardio-esophageal junction, poorly differentiated, cTxN2M1 with node metastasis, stage IV  -I previously reviewed his CT scan, EGD, and the biopsy results with patient and his family members in great details. -His biopsy pathology was previously reviewed in our tumor board, the morphology and weakly positive for CK 5/6, fevers squamous cell carcinoma -His CT scan  findings are very concerning for distant metastasis to abdominal lymph nodes.  -I previously reviewed the PET scan images with patient and his daughter in person, he has hypermetabolic GE junction tumor, and diffuse adenopathy in mediastinum and upper abdomen. -We previously discussed the aggressive nature of esophageal cancer, an incurable nature of his disease due to the distant metastasis. The goal of therapy is palliative, to prolong his life and palliate his symptoms. -His tumor was negative for PD-L1, not a candidate for immunotherapy  -He received first line palliative chemotherapy with weekly carbo and Taxol, every 2 weeks, tolerated well overall and had excellent response (completed radiographic response). - Patient has been off chemotherapy since 06/2017 due to NED on scan. He is now on observation -We previously reviewed the signs of cancer recurrence. If he notices any pain or new issues he knows to call us  -he has developed pleural effusion since he stopped chemotherapy, he is clinically doing better with restarting lasix, less orthopnea, exam showed mild pleural effusion, repeated chest x-ray in December 2018 showed decreased pleural effusion.  He is on Lasix 20 mg daily now, I recommend him to follow-up with his primary care physician also. -I previously discussed his CT CAP from 12/05/17 which shows no evidence of recurrent or metastatic disease. Also shows cirrhosis with ascites, and persistent b/l pleural effusion.  -I previously discussed the option of an upper endoscopy. Given his lack of symptoms and stable scan he does not need one at this time.  -Will repeat next scan in July or August 2019 -Labs reviewed with pt, he is mildly anemic and his platelets are stable. CMP is pending. He also had labs with PCP last month that were similar.  -F/u in 2 months   2. Bilateral plural effusion, R>L -Possible related to his CHF, he was on furosemide 10 mg daily before chemo, which has been  held since he started chemo.   -He had a trace pleural effusion on previous scan, has moderate right pleural effusion, asymptomatic -I recommend a thoracentesis, further workup of etiology of his pleural effusion, and ruled out malignant pleural effusion. Patient is reluctant to have procedure, but agrees to have one if he does not respond to diuretics -Restart furosemide, 20 mg daily, I called in on 09/12/17  -CXR on 10/21/17 reveals small bilateral effusions, decreased on the right since prior CT.   -CT from 12/05/17 shows persistent b/l pleural effusion -Pt experiences cramps from lasix but manageable.  I previously suggested tonic water or Gatorade and for him to take OTC calcium pill. He is fine to eat 1 banana a day.  -Upon physical exam (12/12/17) he still has pleural effusion, R>L, -Physical exam today (02/10/18) revealed that is pleural effusion is continuing to improve. He will continue furosemide 30m daily   3. CAD, diastolic CHF, on continuous oxygen -He will continue follow-up with his cardiologist Dr. TRadford Pax-  His Plavix has been held since the EGD and biopsy, he is on baby aspirin. -he is clinically stable -On 2 Liters of continuous oxygen [place].  -Will continue to monitor   4. DM, HTN -We previously discussed his blood pressure and glucose need to be monitored closely during the chemotherapy, which may affect his blood glucose and blood pressure -He will continue medication, monitor his sugar and blood pressure at home, and follow-up with his primary care physician. -previously decreased premed dexa before taxol  -History simple has been held due to his borderline low blood pressure -He will see his primary care physician Dr. Anitra Lauth for diabetes management.  5. OSA, morbid obesity -We previously discussed healthy diet, I encouraged him to be more physically active, and consider home PT -previously I strongly encouraged him to follow-up with dietitian during the therapy for  nutrition support.  6. Arthralgia -He has baseline osteoarthritis, not physically active -However his arthralgia in his knees and ankle are much worse after chemotherapy but resolved now. -He will use oxycodone 5 mg as needed, constipation management previously reviewed with him. -Leg weakness improved with physical therapy. Previously encouraged the patient to continue with physical therapy.   7. Liver cirrhosis and ascites --He had a mild elevated AST in the past. Pt states he has not consumed alcohol in 30 years.  -His CT scan showed evidence of liver cirrhosis, and recent CT also showed mild to moderate ascites -He had no known history of liver disease, this could be related to fatty liver or congestive heart failure.   -Continue diuretics, and follow-up  8. Goal of care discussion, DNR/DNI -We previously discussed the incurable nature of his cancer, and the overall poor prognosis, especially if he does not have good response to chemotherapy or progress on chemo -The patient understands the goal of care is palliative. -Patient is very realistic, understands his cancer is not curable, and he previously agreed with DO NOT RESUSCITATE and DO NOT INTUBATE.    PLAN -port flush today or soon if it cannot be scheduled today -plan for scan in July or August  -pleural effusion is continuing to improve. He will continue furosemide 37m daily -Lab, flush and f/u in 2 months   All questions were answered. The patient knows to call the clinic with any problems, questions or concerns.  I spent 20 minutes counseling the patient face to face. The total time spent in the appointment was 25 minutes and more than 50% was on counseling.  This document serves as a record of services personally performed by YTruitt Merle MD. It was created on her behalf by DTheresia Bough a trained medical scribe. The creation of this record is based on the scribe's personal observations and the provider's statements to  them.   I have reviewed the above documentation for accuracy and completeness, and I agree with the above.   YTruitt Merle 02/10/2018

## 2018-02-09 ENCOUNTER — Other Ambulatory Visit: Payer: Self-pay | Admitting: Hematology

## 2018-02-09 DIAGNOSIS — C160 Malignant neoplasm of cardia: Secondary | ICD-10-CM

## 2018-02-10 ENCOUNTER — Inpatient Hospital Stay: Payer: Medicare Other

## 2018-02-10 ENCOUNTER — Encounter: Payer: Self-pay | Admitting: Hematology

## 2018-02-10 ENCOUNTER — Telehealth: Payer: Self-pay | Admitting: Hematology

## 2018-02-10 ENCOUNTER — Inpatient Hospital Stay: Payer: Medicare Other | Attending: Hematology | Admitting: Hematology

## 2018-02-10 VITALS — BP 107/61 | HR 85 | Temp 98.1°F | Resp 16 | Ht 68.0 in | Wt 293.8 lb

## 2018-02-10 DIAGNOSIS — N183 Chronic kidney disease, stage 3 (moderate): Secondary | ICD-10-CM | POA: Diagnosis not present

## 2018-02-10 DIAGNOSIS — I130 Hypertensive heart and chronic kidney disease with heart failure and stage 1 through stage 4 chronic kidney disease, or unspecified chronic kidney disease: Secondary | ICD-10-CM | POA: Insufficient documentation

## 2018-02-10 DIAGNOSIS — R161 Splenomegaly, not elsewhere classified: Secondary | ICD-10-CM | POA: Insufficient documentation

## 2018-02-10 DIAGNOSIS — I252 Old myocardial infarction: Secondary | ICD-10-CM | POA: Diagnosis not present

## 2018-02-10 DIAGNOSIS — J449 Chronic obstructive pulmonary disease, unspecified: Secondary | ICD-10-CM | POA: Insufficient documentation

## 2018-02-10 DIAGNOSIS — J90 Pleural effusion, not elsewhere classified: Secondary | ICD-10-CM | POA: Insufficient documentation

## 2018-02-10 DIAGNOSIS — E119 Type 2 diabetes mellitus without complications: Secondary | ICD-10-CM

## 2018-02-10 DIAGNOSIS — I700 Atherosclerosis of aorta: Secondary | ICD-10-CM | POA: Diagnosis not present

## 2018-02-10 DIAGNOSIS — I10 Essential (primary) hypertension: Secondary | ICD-10-CM

## 2018-02-10 DIAGNOSIS — C160 Malignant neoplasm of cardia: Secondary | ICD-10-CM | POA: Diagnosis not present

## 2018-02-10 DIAGNOSIS — K5730 Diverticulosis of large intestine without perforation or abscess without bleeding: Secondary | ICD-10-CM | POA: Diagnosis not present

## 2018-02-10 DIAGNOSIS — Z87891 Personal history of nicotine dependence: Secondary | ICD-10-CM | POA: Insufficient documentation

## 2018-02-10 DIAGNOSIS — K7460 Unspecified cirrhosis of liver: Secondary | ICD-10-CM | POA: Insufficient documentation

## 2018-02-10 DIAGNOSIS — Z9981 Dependence on supplemental oxygen: Secondary | ICD-10-CM | POA: Diagnosis not present

## 2018-02-10 DIAGNOSIS — E1122 Type 2 diabetes mellitus with diabetic chronic kidney disease: Secondary | ICD-10-CM | POA: Diagnosis not present

## 2018-02-10 DIAGNOSIS — Z923 Personal history of irradiation: Secondary | ICD-10-CM | POA: Diagnosis not present

## 2018-02-10 DIAGNOSIS — R188 Other ascites: Secondary | ICD-10-CM | POA: Insufficient documentation

## 2018-02-10 DIAGNOSIS — Z9221 Personal history of antineoplastic chemotherapy: Secondary | ICD-10-CM | POA: Diagnosis not present

## 2018-02-10 DIAGNOSIS — Z79899 Other long term (current) drug therapy: Secondary | ICD-10-CM | POA: Insufficient documentation

## 2018-02-10 DIAGNOSIS — C787 Secondary malignant neoplasm of liver and intrahepatic bile duct: Secondary | ICD-10-CM

## 2018-02-10 DIAGNOSIS — I5032 Chronic diastolic (congestive) heart failure: Secondary | ICD-10-CM

## 2018-02-10 DIAGNOSIS — E785 Hyperlipidemia, unspecified: Secondary | ICD-10-CM | POA: Diagnosis not present

## 2018-02-10 DIAGNOSIS — E875 Hyperkalemia: Secondary | ICD-10-CM | POA: Diagnosis not present

## 2018-02-10 DIAGNOSIS — Z95828 Presence of other vascular implants and grafts: Secondary | ICD-10-CM

## 2018-02-10 DIAGNOSIS — I2510 Atherosclerotic heart disease of native coronary artery without angina pectoris: Secondary | ICD-10-CM | POA: Diagnosis not present

## 2018-02-10 LAB — CBC WITH DIFFERENTIAL/PLATELET
BASOS ABS: 0 10*3/uL (ref 0.0–0.1)
Basophils Relative: 1 %
EOS ABS: 0.1 10*3/uL (ref 0.0–0.5)
Eosinophils Relative: 3 %
HCT: 34.6 % — ABNORMAL LOW (ref 38.4–49.9)
HEMOGLOBIN: 10.7 g/dL — AB (ref 13.0–17.1)
LYMPHS ABS: 0.9 10*3/uL (ref 0.9–3.3)
Lymphocytes Relative: 19 %
MCH: 31.8 pg (ref 27.2–33.4)
MCHC: 30.9 g/dL — ABNORMAL LOW (ref 32.0–36.0)
MCV: 102.7 fL — ABNORMAL HIGH (ref 79.3–98.0)
Monocytes Absolute: 0.5 10*3/uL (ref 0.1–0.9)
Monocytes Relative: 11 %
NEUTROS PCT: 66 %
Neutro Abs: 3.2 10*3/uL (ref 1.5–6.5)
Platelets: 85 10*3/uL — ABNORMAL LOW (ref 140–400)
RBC: 3.37 MIL/uL — AB (ref 4.20–5.82)
RDW: 16.9 % — ABNORMAL HIGH (ref 11.0–14.6)
WBC: 4.8 10*3/uL (ref 4.0–10.3)

## 2018-02-10 LAB — CMP (CANCER CENTER ONLY)
ALT: 56 U/L — ABNORMAL HIGH (ref 0–55)
AST: 67 U/L — ABNORMAL HIGH (ref 5–34)
Albumin: 3.3 g/dL — ABNORMAL LOW (ref 3.5–5.0)
Alkaline Phosphatase: 70 U/L (ref 40–150)
Anion gap: 10 (ref 3–11)
BUN: 13 mg/dL (ref 7–26)
CHLORIDE: 97 mmol/L — AB (ref 98–109)
CO2: 31 mmol/L — ABNORMAL HIGH (ref 22–29)
CREATININE: 1.19 mg/dL (ref 0.70–1.30)
Calcium: 9 mg/dL (ref 8.4–10.4)
GFR, EST NON AFRICAN AMERICAN: 59 mL/min — AB (ref >60)
Glucose, Bld: 125 mg/dL (ref 70–140)
POTASSIUM: 4 mmol/L (ref 3.5–5.1)
Sodium: 138 mmol/L (ref 136–145)
Total Bilirubin: 0.7 mg/dL (ref 0.2–1.2)
Total Protein: 7.3 g/dL (ref 6.4–8.3)

## 2018-02-10 MED ORDER — SODIUM CHLORIDE 0.9% FLUSH
10.0000 mL | INTRAVENOUS | Status: DC | PRN
Start: 2018-02-10 — End: 2018-02-10
  Administered 2018-02-10: 10 mL via INTRAVENOUS
  Filled 2018-02-10: qty 10

## 2018-02-10 MED ORDER — HEPARIN SOD (PORK) LOCK FLUSH 100 UNIT/ML IV SOLN
500.0000 [IU] | Freq: Once | INTRAVENOUS | Status: AC | PRN
  Administered 2018-02-10: 500 [IU] via INTRAVENOUS
  Filled 2018-02-10: qty 5

## 2018-02-10 NOTE — Telephone Encounter (Signed)
Appt scheduled AVS/Calendar printed per 4/15 los °

## 2018-02-10 NOTE — Patient Instructions (Signed)
Implanted Port Home Guide An implanted port is a type of central line that is placed under the skin. Central lines are used to provide IV access when treatment or nutrition needs to be given through a person's veins. Implanted ports are used for long-term IV access. An implanted port may be placed because:  You need IV medicine that would be irritating to the small veins in your hands or arms.  You need long-term IV medicines, such as antibiotics.  You need IV nutrition for a long period.  You need frequent blood draws for lab tests.  You need dialysis.  Implanted ports are usually placed in the chest area, but they can also be placed in the upper arm, the abdomen, or the leg. An implanted port has two main parts:  Reservoir. The reservoir is round and will appear as a small, raised area under your skin. The reservoir is the part where a needle is inserted to give medicines or draw blood.  Catheter. The catheter is a thin, flexible tube that extends from the reservoir. The catheter is placed into a large vein. Medicine that is inserted into the reservoir goes into the catheter and then into the vein.  How will I care for my incision site? Do not get the incision site wet. Bathe or shower as directed by your health care provider. How is my port accessed? Special steps must be taken to access the port:  Before the port is accessed, a numbing cream can be placed on the skin. This helps numb the skin over the port site.  Your health care provider uses a sterile technique to access the port. ? Your health care provider must put on a mask and sterile gloves. ? The skin over your port is cleaned carefully with an antiseptic and allowed to dry. ? The port is gently pinched between sterile gloves, and a needle is inserted into the port.  Only "non-coring" port needles should be used to access the port. Once the port is accessed, a blood return should be checked. This helps ensure that the port  is in the vein and is not clogged.  If your port needs to remain accessed for a constant infusion, a clear (transparent) bandage will be placed over the needle site. The bandage and needle will need to be changed every week, or as directed by your health care provider.  Keep the bandage covering the needle clean and dry. Do not get it wet. Follow your health care provider's instructions on how to take a shower or bath while the port is accessed.  If your port does not need to stay accessed, no bandage is needed over the port.  What is flushing? Flushing helps keep the port from getting clogged. Follow your health care provider's instructions on how and when to flush the port. Ports are usually flushed with saline solution or a medicine called heparin. The need for flushing will depend on how the port is used.  If the port is used for intermittent medicines or blood draws, the port will need to be flushed: ? After medicines have been given. ? After blood has been drawn. ? As part of routine maintenance.  If a constant infusion is running, the port may not need to be flushed.  How long will my port stay implanted? The port can stay in for as long as your health care provider thinks it is needed. When it is time for the port to come out, surgery will be   done to remove it. The procedure is similar to the one performed when the port was put in. When should I seek immediate medical care? When you have an implanted port, you should seek immediate medical care if:  You notice a bad smell coming from the incision site.  You have swelling, redness, or drainage at the incision site.  You have more swelling or pain at the port site or the surrounding area.  You have a fever that is not controlled with medicine.  This information is not intended to replace advice given to you by your health care provider. Make sure you discuss any questions you have with your health care provider. Document  Released: 10/15/2005 Document Revised: 03/22/2016 Document Reviewed: 06/22/2013 Elsevier Interactive Patient Education  2017 Elsevier Inc.  

## 2018-02-24 ENCOUNTER — Other Ambulatory Visit: Payer: Self-pay | Admitting: Family Medicine

## 2018-02-24 ENCOUNTER — Other Ambulatory Visit: Payer: Self-pay

## 2018-02-24 MED ORDER — PANTOPRAZOLE SODIUM 40 MG PO TBEC: 40.0000 mg | DELAYED_RELEASE_TABLET | Freq: Two times a day (BID) | ORAL | 6 refills | Status: DC

## 2018-02-26 DIAGNOSIS — K922 Gastrointestinal hemorrhage, unspecified: Secondary | ICD-10-CM

## 2018-02-26 HISTORY — DX: Gastrointestinal hemorrhage, unspecified: K92.2

## 2018-03-07 DIAGNOSIS — J449 Chronic obstructive pulmonary disease, unspecified: Secondary | ICD-10-CM | POA: Diagnosis not present

## 2018-03-18 ENCOUNTER — Telehealth: Payer: Self-pay

## 2018-03-18 ENCOUNTER — Other Ambulatory Visit: Payer: Self-pay

## 2018-03-18 ENCOUNTER — Inpatient Hospital Stay (HOSPITAL_COMMUNITY)
Admission: EM | Admit: 2018-03-18 | Discharge: 2018-03-22 | DRG: 381 | Disposition: A | Discharge status: Home / Self Care | Payer: Medicare Other | Attending: Family Medicine | Admitting: Family Medicine

## 2018-03-18 ENCOUNTER — Encounter (HOSPITAL_COMMUNITY): Payer: Self-pay

## 2018-03-18 DIAGNOSIS — D62 Acute posthemorrhagic anemia: Secondary | ICD-10-CM | POA: Diagnosis present

## 2018-03-18 DIAGNOSIS — Z923 Personal history of irradiation: Secondary | ICD-10-CM

## 2018-03-18 DIAGNOSIS — E1122 Type 2 diabetes mellitus with diabetic chronic kidney disease: Secondary | ICD-10-CM | POA: Diagnosis not present

## 2018-03-18 DIAGNOSIS — I2510 Atherosclerotic heart disease of native coronary artery without angina pectoris: Secondary | ICD-10-CM | POA: Diagnosis not present

## 2018-03-18 DIAGNOSIS — K920 Hematemesis: Secondary | ICD-10-CM | POA: Diagnosis not present

## 2018-03-18 DIAGNOSIS — R161 Splenomegaly, not elsewhere classified: Secondary | ICD-10-CM | POA: Diagnosis not present

## 2018-03-18 DIAGNOSIS — K922 Gastrointestinal hemorrhage, unspecified: Secondary | ICD-10-CM

## 2018-03-18 DIAGNOSIS — I5032 Chronic diastolic (congestive) heart failure: Secondary | ICD-10-CM | POA: Diagnosis not present

## 2018-03-18 DIAGNOSIS — K3189 Other diseases of stomach and duodenum: Secondary | ICD-10-CM | POA: Diagnosis not present

## 2018-03-18 DIAGNOSIS — Z6841 Body Mass Index (BMI) 40.0 and over, adult: Secondary | ICD-10-CM

## 2018-03-18 DIAGNOSIS — K2211 Ulcer of esophagus with bleeding: Principal | ICD-10-CM | POA: Diagnosis present

## 2018-03-18 DIAGNOSIS — J438 Other emphysema: Secondary | ICD-10-CM | POA: Diagnosis not present

## 2018-03-18 DIAGNOSIS — Z794 Long term (current) use of insulin: Secondary | ICD-10-CM

## 2018-03-18 DIAGNOSIS — Z9981 Dependence on supplemental oxygen: Secondary | ICD-10-CM

## 2018-03-18 DIAGNOSIS — K2210 Ulcer of esophagus without bleeding: Secondary | ICD-10-CM | POA: Diagnosis not present

## 2018-03-18 DIAGNOSIS — Z9221 Personal history of antineoplastic chemotherapy: Secondary | ICD-10-CM

## 2018-03-18 DIAGNOSIS — K7460 Unspecified cirrhosis of liver: Secondary | ICD-10-CM | POA: Diagnosis not present

## 2018-03-18 DIAGNOSIS — J9611 Chronic respiratory failure with hypoxia: Secondary | ICD-10-CM | POA: Diagnosis present

## 2018-03-18 DIAGNOSIS — H9193 Unspecified hearing loss, bilateral: Secondary | ICD-10-CM | POA: Diagnosis not present

## 2018-03-18 DIAGNOSIS — C160 Malignant neoplasm of cardia: Secondary | ICD-10-CM | POA: Diagnosis present

## 2018-03-18 DIAGNOSIS — Z961 Presence of intraocular lens: Secondary | ICD-10-CM | POA: Diagnosis present

## 2018-03-18 DIAGNOSIS — I10 Essential (primary) hypertension: Secondary | ICD-10-CM | POA: Diagnosis not present

## 2018-03-18 DIAGNOSIS — Z8501 Personal history of malignant neoplasm of esophagus: Secondary | ICD-10-CM

## 2018-03-18 DIAGNOSIS — Y842 Radiological procedure and radiotherapy as the cause of abnormal reaction of the patient, or of later complication, without mention of misadventure at the time of the procedure: Secondary | ICD-10-CM | POA: Diagnosis present

## 2018-03-18 DIAGNOSIS — N179 Acute kidney failure, unspecified: Secondary | ICD-10-CM | POA: Diagnosis not present

## 2018-03-18 DIAGNOSIS — R000 Tachycardia, unspecified: Secondary | ICD-10-CM | POA: Diagnosis not present

## 2018-03-18 DIAGNOSIS — Z66 Do not resuscitate: Secondary | ICD-10-CM | POA: Diagnosis present

## 2018-03-18 DIAGNOSIS — K254 Chronic or unspecified gastric ulcer with hemorrhage: Secondary | ICD-10-CM | POA: Diagnosis not present

## 2018-03-18 DIAGNOSIS — E119 Type 2 diabetes mellitus without complications: Secondary | ICD-10-CM

## 2018-03-18 DIAGNOSIS — Z833 Family history of diabetes mellitus: Secondary | ICD-10-CM

## 2018-03-18 DIAGNOSIS — H5340 Unspecified visual field defects: Secondary | ICD-10-CM | POA: Diagnosis present

## 2018-03-18 DIAGNOSIS — Z720 Tobacco use: Secondary | ICD-10-CM

## 2018-03-18 DIAGNOSIS — F329 Major depressive disorder, single episode, unspecified: Secondary | ICD-10-CM | POA: Diagnosis present

## 2018-03-18 DIAGNOSIS — Z9841 Cataract extraction status, right eye: Secondary | ICD-10-CM

## 2018-03-18 DIAGNOSIS — K2950 Unspecified chronic gastritis without bleeding: Secondary | ICD-10-CM | POA: Diagnosis not present

## 2018-03-18 DIAGNOSIS — Z7982 Long term (current) use of aspirin: Secondary | ICD-10-CM

## 2018-03-18 DIAGNOSIS — K2970 Gastritis, unspecified, without bleeding: Secondary | ICD-10-CM | POA: Diagnosis not present

## 2018-03-18 DIAGNOSIS — K8020 Calculus of gallbladder without cholecystitis without obstruction: Secondary | ICD-10-CM | POA: Diagnosis not present

## 2018-03-18 DIAGNOSIS — I252 Old myocardial infarction: Secondary | ICD-10-CM

## 2018-03-18 DIAGNOSIS — I130 Hypertensive heart and chronic kidney disease with heart failure and stage 1 through stage 4 chronic kidney disease, or unspecified chronic kidney disease: Secondary | ICD-10-CM | POA: Diagnosis not present

## 2018-03-18 DIAGNOSIS — E662 Morbid (severe) obesity with alveolar hypoventilation: Secondary | ICD-10-CM | POA: Diagnosis present

## 2018-03-18 DIAGNOSIS — Z79899 Other long term (current) drug therapy: Secondary | ICD-10-CM

## 2018-03-18 DIAGNOSIS — E785 Hyperlipidemia, unspecified: Secondary | ICD-10-CM | POA: Diagnosis not present

## 2018-03-18 DIAGNOSIS — R0600 Dyspnea, unspecified: Secondary | ICD-10-CM | POA: Diagnosis present

## 2018-03-18 DIAGNOSIS — R0902 Hypoxemia: Secondary | ICD-10-CM | POA: Diagnosis present

## 2018-03-18 DIAGNOSIS — E1140 Type 2 diabetes mellitus with diabetic neuropathy, unspecified: Secondary | ICD-10-CM | POA: Diagnosis present

## 2018-03-18 DIAGNOSIS — J449 Chronic obstructive pulmonary disease, unspecified: Secondary | ICD-10-CM | POA: Diagnosis present

## 2018-03-18 DIAGNOSIS — Z8249 Family history of ischemic heart disease and other diseases of the circulatory system: Secondary | ICD-10-CM

## 2018-03-18 DIAGNOSIS — N183 Chronic kidney disease, stage 3 (moderate): Secondary | ICD-10-CM | POA: Diagnosis present

## 2018-03-18 DIAGNOSIS — Z9842 Cataract extraction status, left eye: Secondary | ICD-10-CM

## 2018-03-18 DIAGNOSIS — Z95828 Presence of other vascular implants and grafts: Secondary | ICD-10-CM

## 2018-03-18 DIAGNOSIS — G4733 Obstructive sleep apnea (adult) (pediatric): Secondary | ICD-10-CM | POA: Diagnosis present

## 2018-03-18 DIAGNOSIS — Z87442 Personal history of urinary calculi: Secondary | ICD-10-CM

## 2018-03-18 DIAGNOSIS — R188 Other ascites: Secondary | ICD-10-CM | POA: Diagnosis not present

## 2018-03-18 DIAGNOSIS — Z89022 Acquired absence of left finger(s): Secondary | ICD-10-CM

## 2018-03-18 DIAGNOSIS — E1151 Type 2 diabetes mellitus with diabetic peripheral angiopathy without gangrene: Secondary | ICD-10-CM | POA: Diagnosis not present

## 2018-03-18 DIAGNOSIS — K264 Chronic or unspecified duodenal ulcer with hemorrhage: Secondary | ICD-10-CM | POA: Diagnosis not present

## 2018-03-18 LAB — URINALYSIS, ROUTINE W REFLEX MICROSCOPIC
BACTERIA UA: NONE SEEN
Bilirubin Urine: NEGATIVE
GLUCOSE, UA: NEGATIVE mg/dL
Hgb urine dipstick: NEGATIVE
Ketones, ur: 5 mg/dL — AB
LEUKOCYTES UA: NEGATIVE
NITRITE: NEGATIVE
PROTEIN: NEGATIVE mg/dL
Specific Gravity, Urine: 1.017 (ref 1.005–1.030)
pH: 6 (ref 5.0–8.0)

## 2018-03-18 LAB — CBC
HEMATOCRIT: 21.1 % — AB (ref 39.0–52.0)
Hemoglobin: 6.6 g/dL — CL (ref 13.0–17.0)
MCH: 32.4 pg (ref 26.0–34.0)
MCHC: 31.3 g/dL (ref 30.0–36.0)
MCV: 103.4 fL — AB (ref 78.0–100.0)
PLATELETS: 164 10*3/uL (ref 150–400)
RBC: 2.04 MIL/uL — AB (ref 4.22–5.81)
RDW: 16.7 % — AB (ref 11.5–15.5)
WBC: 11.3 10*3/uL — AB (ref 4.0–10.5)

## 2018-03-18 LAB — COMPREHENSIVE METABOLIC PANEL
ALT: 48 U/L (ref 17–63)
AST: 70 U/L — AB (ref 15–41)
Albumin: 3 g/dL — ABNORMAL LOW (ref 3.5–5.0)
Alkaline Phosphatase: 53 U/L (ref 38–126)
Anion gap: 16 — ABNORMAL HIGH (ref 5–15)
BUN: 47 mg/dL — AB (ref 6–20)
CHLORIDE: 92 mmol/L — AB (ref 101–111)
CO2: 28 mmol/L (ref 22–32)
Calcium: 8.7 mg/dL — ABNORMAL LOW (ref 8.9–10.3)
Creatinine, Ser: 1.32 mg/dL — ABNORMAL HIGH (ref 0.61–1.24)
GFR calc Af Amer: >60 mL/min (ref >60)
GFR, EST NON AFRICAN AMERICAN: 52 mL/min — AB (ref >60)
Glucose, Bld: 170 mg/dL — ABNORMAL HIGH (ref 65–99)
Potassium: 4.6 mmol/L (ref 3.5–5.1)
SODIUM: 136 mmol/L (ref 135–145)
Total Bilirubin: 1.1 mg/dL (ref 0.3–1.2)
Total Protein: 6.6 g/dL (ref 6.5–8.1)

## 2018-03-18 LAB — POC OCCULT BLOOD, ED: Fecal Occult Bld: POSITIVE — AB

## 2018-03-18 LAB — LIPASE, BLOOD: Lipase: 22 U/L (ref 11–51)

## 2018-03-18 LAB — HEMOGLOBIN AND HEMATOCRIT, BLOOD
HCT: 22.1 % — ABNORMAL LOW (ref 39.0–52.0)
Hemoglobin: 7 g/dL — ABNORMAL LOW (ref 13.0–17.0)

## 2018-03-18 LAB — GLUCOSE, CAPILLARY: Glucose-Capillary: 152 mg/dL — ABNORMAL HIGH (ref 65–99)

## 2018-03-18 LAB — PREPARE RBC (CROSSMATCH)

## 2018-03-18 MED ORDER — ATORVASTATIN CALCIUM 40 MG PO TABS
80.0000 mg | ORAL_TABLET | Freq: Every day | ORAL | Status: DC
  Administered 2018-03-18 – 2018-03-22 (×5): 80 mg via ORAL
  Filled 2018-03-18 (×5): qty 2

## 2018-03-18 MED ORDER — ONDANSETRON HCL 4 MG PO TABS: 4.0000 mg | ORAL_TABLET | Freq: Four times a day (QID) | ORAL | Status: DC | PRN

## 2018-03-18 MED ORDER — SODIUM CHLORIDE 0.9 % IV SOLN
250.0000 mL | INTRAVENOUS | Status: DC | PRN
  Administered 2018-03-19: 10:00:00 via INTRAVENOUS
  Administered 2018-03-19: 250 mL via INTRAVENOUS

## 2018-03-18 MED ORDER — INSULIN ASPART 100 UNIT/ML ~~LOC~~ SOLN
0.0000 [IU] | Freq: Three times a day (TID) | SUBCUTANEOUS | Status: DC
  Administered 2018-03-19 (×2): 1 [IU] via SUBCUTANEOUS
  Administered 2018-03-19: 3 [IU] via SUBCUTANEOUS
  Administered 2018-03-20: 1 [IU] via SUBCUTANEOUS
  Administered 2018-03-20 (×2): 3 [IU] via SUBCUTANEOUS
  Administered 2018-03-21 (×2): 2 [IU] via SUBCUTANEOUS
  Administered 2018-03-21: 5 [IU] via SUBCUTANEOUS
  Administered 2018-03-22: 3 [IU] via SUBCUTANEOUS
  Administered 2018-03-22: 2 [IU] via SUBCUTANEOUS

## 2018-03-18 MED ORDER — FAMOTIDINE IN NACL 20-0.9 MG/50ML-% IV SOLN: 20.0000 mg | Freq: Two times a day (BID) | INTRAVENOUS | Status: DC

## 2018-03-18 MED ORDER — HYDROXYZINE HCL 25 MG PO TABS
50.0000 mg | ORAL_TABLET | Freq: Four times a day (QID) | ORAL | Status: DC | PRN
  Administered 2018-03-18: 50 mg via ORAL
  Filled 2018-03-18: qty 2

## 2018-03-18 MED ORDER — SODIUM CHLORIDE 0.9 % IV SOLN
10.0000 mL/h | Freq: Once | INTRAVENOUS | Status: AC
  Administered 2018-03-18: 10 mL/h via INTRAVENOUS

## 2018-03-18 MED ORDER — PANTOPRAZOLE SODIUM 40 MG IV SOLR
40.0000 mg | Freq: Two times a day (BID) | INTRAVENOUS | Status: DC
  Administered 2018-03-18 – 2018-03-19 (×2): 40 mg via INTRAVENOUS
  Filled 2018-03-18 (×3): qty 40

## 2018-03-18 MED ORDER — GABAPENTIN 300 MG PO CAPS
300.0000 mg | ORAL_CAPSULE | Freq: Three times a day (TID) | ORAL | Status: DC
  Administered 2018-03-18 – 2018-03-22 (×11): 300 mg via ORAL
  Filled 2018-03-18 (×11): qty 1

## 2018-03-18 MED ORDER — EZETIMIBE 10 MG PO TABS
10.0000 mg | ORAL_TABLET | Freq: Every day | ORAL | Status: DC
  Administered 2018-03-18 – 2018-03-22 (×5): 10 mg via ORAL
  Filled 2018-03-18 (×5): qty 1

## 2018-03-18 MED ORDER — LORATADINE 10 MG PO TABS
10.0000 mg | ORAL_TABLET | Freq: Every day | ORAL | Status: DC
  Administered 2018-03-18 – 2018-03-22 (×5): 10 mg via ORAL
  Filled 2018-03-18 (×5): qty 1

## 2018-03-18 MED ORDER — CVS FISH OIL 1000 MG PO CAPS: 2.0000 | ORAL_CAPSULE | Freq: Two times a day (BID) | ORAL | Status: DC

## 2018-03-18 MED ORDER — ACETAMINOPHEN 325 MG PO TABS: 650.0000 mg | ORAL_TABLET | Freq: Four times a day (QID) | ORAL | Status: DC | PRN

## 2018-03-18 MED ORDER — SODIUM CHLORIDE 0.9% FLUSH: 3.0000 mL | INTRAVENOUS | Status: DC | PRN

## 2018-03-18 MED ORDER — CLONAZEPAM 1 MG PO TABS: 1.0000 mg | ORAL_TABLET | Freq: Two times a day (BID) | ORAL | Status: DC | PRN

## 2018-03-18 MED ORDER — POLYETHYLENE GLYCOL 3350 17 G PO PACK: 17.0000 g | PACK | Freq: Every day | ORAL | Status: DC | PRN

## 2018-03-18 MED ORDER — SODIUM CHLORIDE 0.9% FLUSH
3.0000 mL | Freq: Two times a day (BID) | INTRAVENOUS | Status: DC
  Administered 2018-03-18 – 2018-03-22 (×6): 3 mL via INTRAVENOUS

## 2018-03-18 MED ORDER — PANTOPRAZOLE SODIUM 40 MG IV SOLR: 40.0000 mg | Freq: Two times a day (BID) | INTRAVENOUS | Status: DC

## 2018-03-18 MED ORDER — INSULIN NPH (HUMAN) (ISOPHANE) 100 UNIT/ML ~~LOC~~ SUSP
10.0000 [IU] | Freq: Two times a day (BID) | SUBCUTANEOUS | Status: DC
  Administered 2018-03-18 – 2018-03-22 (×8): 10 [IU] via SUBCUTANEOUS
  Filled 2018-03-18: qty 10

## 2018-03-18 MED ORDER — TRAMADOL HCL 50 MG PO TABS: 50.0000 mg | ORAL_TABLET | Freq: Four times a day (QID) | ORAL | Status: DC | PRN

## 2018-03-18 MED ORDER — ACETAMINOPHEN 650 MG RE SUPP: 650.0000 mg | Freq: Four times a day (QID) | RECTAL | Status: DC | PRN

## 2018-03-18 MED ORDER — CITALOPRAM HYDROBROMIDE 20 MG PO TABS
20.0000 mg | ORAL_TABLET | Freq: Every day | ORAL | Status: DC
  Administered 2018-03-18 – 2018-03-22 (×5): 20 mg via ORAL
  Filled 2018-03-18 (×5): qty 1

## 2018-03-18 MED ORDER — INSULIN ISOPHANE HUMAN 100 UNIT/ML KWIKPEN
10.0000 [IU] | PEN_INJECTOR | Freq: Two times a day (BID) | SUBCUTANEOUS | Status: DC
Start: 2018-03-18 — End: 2018-03-18

## 2018-03-18 MED ORDER — BACLOFEN 10 MG PO TABS: 10.0000 mg | ORAL_TABLET | Freq: Three times a day (TID) | ORAL | Status: DC | PRN

## 2018-03-18 MED ORDER — SALINE SPRAY 0.65 % NA SOLN
1.0000 | NASAL | Status: DC | PRN
  Administered 2018-03-18: 1 via NASAL
  Filled 2018-03-18: qty 44

## 2018-03-18 MED ORDER — OMEGA-3-ACID ETHYL ESTERS 1 G PO CAPS
1.0000 g | ORAL_CAPSULE | Freq: Two times a day (BID) | ORAL | Status: DC
  Administered 2018-03-18 – 2018-03-22 (×8): 1 g via ORAL
  Filled 2018-03-18 (×8): qty 1

## 2018-03-18 MED ORDER — ONDANSETRON HCL 4 MG/2ML IJ SOLN
4.0000 mg | Freq: Four times a day (QID) | INTRAMUSCULAR | Status: DC | PRN
  Administered 2018-03-19: 4 mg via INTRAVENOUS
  Filled 2018-03-18: qty 2

## 2018-03-18 NOTE — H&P (Signed)
History and Physical    [name] AJG:[id number] DOB: 03/26/1946 DOA: 03/18/2018  PCP: Tammi Sou, MD   Patient coming from: home    Chief Complaint: N/V, melena  HPI: [name] is a 72 y.o. 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  Patient lives with his daughter who reported that starting approximately 3 days ago patient began to notice dark old blood in his stool.  He had multiple bowel movements such as this but did not tell anybody.  Yesterday he had several episodes of nonbilious nonbloody emesis that was largely dark.  Unfortunately he continues to chew tobacco and it was difficult to tell if this was from his chewing tobacco or true coffee-ground emesis.  He denies any abdominal pain, fevers, cough, congestion, chest pain, syncope/presyncope.  He has chronic orthopnea due to known bilateral pleural effusions and he has chronic lower extremity edema for which he uses compression stockings.  He does report increased shortness of breath and dyspnea on exertion over the past day or so ever since the symptoms started.  Of note  ED Course: In the emergency department patient was noted to be slightly tachycardic from the 100s to 115.  Labs are notable for creatinine of 1.37 and a BUN of 47.  CBC was notable for hemoglobin of 6.6 with a hemoglobin of around 10-11 over the past 6 months.  Fecal occult blood was positive.  GI was consulted and patient was given a transfusion and IV famotidine.  Review of Systems: As per  HPI otherwise 10 point review of systems negative.    Past Medical History:  Diagnosis Date  . Asthma    as a child  . CAD in native artery    a. cardiac cath 09/2013 showed severely diseased mLAD and diagonal, mild LCx disease, moderate RCA disease, LVEDP 20, EF 50%.  . Cataract    multiple types, bilateral  . Cholelithiasis without cholecystitis   . Chronic diastolic CHF (congestive heart failure) ([place]) 02/25/2009  . Chronic renal insufficiency, stage III (moderate) (HCC) 2017   Stage II/III (GFR around 60)  . 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  CT 11/2017 w/ascites and persistent bilat pleural effusions  . 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 COPD (chronic obstructive pulmonary disease) ([place])   . Coronary artery disease    chronically occluded LAD and diagonal with right to left collaterals, mild disease in the left circ and moderate disease in the mid RCA on medical management with Imdur, ASA, and Plavix.  . Dermatitis 05/2014  . DIABETES MELLITUS, TYPE II 06/24/2007   No diab retpthy as of 08/05/15 eye exam.  . Esophageal cancer ([place]) 04/2016   poorly differentiated carcinoma (Dr. Pyrtle--EGD).  CT C/A/P showed metastatic adenopathy in mediastinum and upper abdomen 05/09/16.  Tx plan is palliative radiation (completed 06/22/16), then palliative systemic chemotherapy (carbo+taxol) was started but as of 08/03/16 onc f/u this was held due to severe knee and ankle arthralgias.  Restarting as of 10/2015 (08/2016 CT showed  some disease regression  . Esophageal cancer ([place])    CT 12/2016 showed no residual esoph mass--plan to continue chemo.  Ongoing palliative chemo with carboplatin and taxol q 2 wks as of 03/2017 onc f/u (CT 12/2016 and 04/2017 showed no progression of dz).  Taking a 2 mo break from chemo as of 05/2017.  Pleural effusion-- improved with lasix but onc wanted him to get dx/therap thoracentesis-pt  declined.  CT C/A/P no progression/recurr/mets 11/2017.  Marland Kitchen Essential hypertension 05/01/2007   Qualifier: Diagnosis of  By: Tiney Rouge CMA, Ellison Hughs     . History of kidney stones   . Hyperkalemia 12/2015   Decreased ACE-I by 50% in response, then potassium normalized.  Marland Kitchen HYPERLIPIDEMIA 06/24/2007  . Hypoxemia 01/27/2010  . Morbid obesity ([place])   . Myocardial infarction [facility])    pt states he was informed per MD that he has had one but pt was unaware   . Obesity hypoventilation syndrome (HCC)    oxygen 24/7 (2 liters [place] as of 02/2016)  . On home oxygen therapy    Oxygen @ 2l/m nasally 24/7 hours  . OSA (obstructive sleep apnea)    not tested; pt scored 4 per stop bang tool results sent to PCP   . OSTEOARTHRITIS 05/01/2007  . Pancytopenia due to chemotherapy ([place]) 2018  . Pleural effusion on right 08/2017   ? malignant vs CHF: pt refuses diagnostic thoracentesis b/c he states he feels fine, wants to try diuretic first.  . Pleural effusion on right 09/2017   Thoracentesis  . Presbycusis of both ears 05/2015   [facility]  . Pruritic condition 05/2014   Allergist summer 2015, no new testing.  Marland Kitchen RBBB   . Visual field defect 05/10/2016   Per Dr. Melina Fiddler, O.D.: Rt, loss of inf/temp quad and some loss sup/temp.  ?CVA  ? Pituitary tumor? 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    Past Surgical History:  Procedure Laterality Date  . APPENDECTOMY    . BALLOON DILATION N/A 05/04/2016   Procedure: BALLOON DILATION;  Surgeon: Jerene Bears, MD;  Location: [facility];  Service: Gastroenterology;  Laterality: N/A;  . CARDIAC CATHETERIZATION    . CARPAL TUNNEL RELEASE Left   . CATARACT EXTRACTION W/PHACO Right 04/29/2013   Procedure: CATARACT EXTRACTION PHACO AND INTRAOCULAR LENS PLACEMENT (IOC);  Surgeon: Adonis Brook, MD;  Location: [place];  Service: Ophthalmology;  Laterality: Right;  . CATARACT EXTRACTION, BILATERAL    . COLONOSCOPY W/ POLYPECTOMY  08/2011   Many  polyps--all hyperplastic, severe diverticulosis, int hem.  [employer] hemoccult testing via [facility] 06/22/15 NEG  . ESOPHAGOGASTRODUODENOSCOPY N/A 05/04/2016   Procedure: ESOPHAGOGASTRODUODENOSCOPY (EGD);  Surgeon: Jerene Bears, MD;  Location: [facility];  Service: Gastroenterology;  Laterality: N/A;  . IR GENERIC HISTORICAL  10/03/2016   IR US GUIDE VASC ACCESS RIGHT 10/03/2016 Greggory Keen, MD [facility]  . IR GENERIC HISTORICAL  10/03/2016   IR FLUORO GUIDE PORT INSERTION RIGHT 10/03/2016 Greggory Keen, MD [facility]  . KNEE ARTHROSCOPY WITH MEDIAL MENISECTOMY Right 12/16/2014   Procedure: RIGHT KNEE ARTHROSCOPY WITH MEDIAL MENISECTOMY microfracture medial femoral condyle abrasion condroplasty medial femoral condyle lateral menisectomy;  Surgeon: Tobi Bastos, MD;  Location: [facility];  Service: Orthopedics;  Laterality: Right;  . LEFT HEART CATHETERIZATION WITH CORONARY ANGIOGRAM N/A 10/26/2013   Procedure: LEFT HEART CATHETERIZATION WITH CORONARY ANGIOGRAM;  Surgeon: Jettie Booze, MD;  Location: [facility] CATH LAB;  Service: Cardiovascular;  Laterality: N/A;  . LUMBAR [place] SURGERY  2001  . SHOULDER SURGERY Right    right fx  . TONSILLECTOMY    . TRANSTHORACIC ECHOCARDIOGRAM  03/2016   EF 55-60%, grade I DD, LAE  . WRIST SURGERY Right    right fx     reports that he quit smoking about 34 years ago. His smoking use included cigarettes, pipe, and cigars. He has a 45.00 pack-year smoking history. His smokeless tobacco use includes chew. He reports that he does not drink alcohol or use drugs.  No Known Allergies  Family History  Problem Relation Age of Onset  . Heart attack Mother   . Aneurysm Father   . Alcohol abuse Father   . Diabetes Maternal Aunt   . Colon cancer Neg Hx     Prior to Admission medications   Medication Sig Start Date End Date Taking? Authorizing Provider  aspirin 81 MG tablet Take 81 mg by mouth daily.     Yes [provider]  atorvastatin (LIPITOR) 80  MG tablet TAKE 1 TABLET BY MOUTH ONCE DAILY 10/01/17  Yes Turner, Eber Hong, MD  baclofen (LIORESAL) 10 MG tablet 1/2 - 1 tab po q8h prn muscle spasms 07/26/17  Yes McGowen, Adrian Blackwater, MD  beta carotene w/minerals (OCUVITE) tablet Take 1 tablet by mouth daily.   Yes [provider]  cetirizine (ZYRTEC) 10 MG tablet Take 10 mg by mouth daily.   Yes [provider]  citalopram (CELEXA) 20 MG tablet TAKE 1 TABLET BY MOUTH ONCE DAILY 01/16/18  Yes McGowen, Adrian Blackwater, MD  clonazePAM (KLONOPIN) 1 MG tablet TAKE 1 TABLET BY MOUTH TWICE DAILY AS NEEDED FOR  ANXIETY 01/16/18  Yes McGowen, Adrian Blackwater, MD  cromolyn (OPTICROM) 4 % ophthalmic solution Place 1 drop into both eyes. 3 to 4 times daily 11/05/17  Yes [provider]  ezetimibe (ZETIA) 10 MG tablet Take 1 tablet (10 mg total) by mouth daily. 04/09/17 03/18/18 Yes Turner, Eber Hong, MD  furosemide (LASIX) 20 MG tablet Take 1 tablet (20 mg total) by mouth daily. 11/15/17  Yes McGowen, Adrian Blackwater, MD  gabapentin (NEURONTIN) 300 MG capsule TAKE ONE CAPSULE BY MOUTH THREE TIMES DAILY 11/18/17  Yes McGowen, Adrian Blackwater, MD  HUMULIN N KWIKPEN 100 UNIT/ML Kiwkpen INJECT 16 UNITS EVERY MORNING AND 16 UNITS EVERY EVENING 11/06/17  Yes McGowen, Adrian Blackwater, MD  hydrOXYzine (ATARAX/VISTARIL) 25 MG tablet TAKE 1 TABLET BY MOUTH AT SUPPER AND 1 TAB AT BEDTIME FOR INSOMNIA 05/31/17  Yes McGowen, Adrian Blackwater, MD  Insulin Lispro (HUMALOG KWIKPEN) 200 UNIT/ML SOPN Inject 20 Units into the skin 2 (two) times daily with a meal. Patient taking differently: Inject 16 Units into the skin 2 (two) times daily with a meal. Takes  16 units  Twice  Daily. 04/25/17  Yes McGowen, Adrian Blackwater, MD  magnesium chloride (SLOW-MAG) 64 MG TBEC SR tablet Take 1 tablet (64 mg total) by mouth daily. 09/01/16  Yes Eber Jones, MD  metFORMIN (GLUCOPHAGE) 1000 MG tablet TAKE 1 TABLET BY MOUTH TWICE DAILY WITH MEALS 02/25/18  Yes McGowen, Adrian Blackwater, MD  Omega-3 Fatty Acids ([employer] FISH OIL) 1000 MG  CAPS Take 2 tablets by mouth 2 (two) times daily. 10/18/14  Yes Turner, Eber Hong, MD  OXYGEN Inhale 2 L/min into the lungs continuous.   Yes [provider]  pantoprazole (PROTONIX) 40 MG tablet Take 1 tablet (40 mg total) by mouth 2 (two) times daily. 02/24/18  Yes McGowen,  Adrian Blackwater, MD  clotrimazole-betamethasone (LOTRISONE) cream Apply to affected area twice daily Patient not taking: Reported on 03/18/2018 06/19/17   Tammi Sou, MD  Insulin NPH, Human,, Isophane, (HUMULIN N) 100 UNIT/ML Kiwkpen 16 units every morning and 16 units every evening Patient not taking: Reported on 03/18/2018 12/31/17   Tammi Sou, MD  ONE TOUCH ULTRA TEST test strip USE  STRIP TO CHECK GLUCOSE THREE TIMES DAILY AS  DIRECTED 04/05/17   McGowen, Adrian Blackwater, MD  [facility] DELICA LANCETS 32R MISC USE ONE  TO CHECK GLUCOSE THREE TIMES DAILY 02/26/17   McGowen, Adrian Blackwater, MD  oxyCODONE (OXY IR/ROXICODONE) 5 MG immediate release tablet Take 1 tablet (5 mg total) by mouth every 8 (eight) hours as needed for severe pain. Patient not taking: Reported on 03/18/2018 02/06/17   Truitt Merle, MD    Physical Exam: Vitals:   03/18/18 1203 03/18/18 1208 03/18/18 1209 03/18/18 1300  BP: 104/74   120/72  Pulse: (!) 115   (!) 103  Resp: 18   18  Temp: 98.3 F (36.8 C)     TempSrc: Oral     SpO2: 92% 94%  94%  Weight:   132.9 kg (293 lb)   Height:   _0  (1.753 m)     Constitutional: NAD, calm, comfortable Vitals:   03/18/18 1203 03/18/18 1208 03/18/18 1209 03/18/18 1300  BP: 104/74   120/72  Pulse: (!) 115   (!) 103  Resp: 18   18  Temp: 98.3 F (36.8 C)     TempSrc: Oral     SpO2: 92% 94%  94%  Weight:   132.9 kg (293 lb)   Height:   _1  (1.753 m)    Eyes: Anicteric sclera ENMT: Moist mucous membranes, poor dentition.  Neck: Pickwickian neck Respiratory: No increased work of breathing, diminished lung sounds at bases, no wheezes, crackles, rhonchi.  Cardiovascular: Distant heart sounds, regular rate and  rhythm, no murmurs Abdomen: Soft, nontender, no rebound or guarding, plus bowel sounds Musculoskeletal: 1+ lower extremity edema Skin: no rashes on visible skin Neurologic: Grossly intact, moving all extremities Psychiatric: Normal judgment and insight. Alert and oriented x 3. Normal mood.   Labs on Admission: I have personally reviewed following labs and imaging studies  CBC: Recent Labs  Lab 03/18/18 1315  WBC 11.3*  HGB 6.6*  HCT 21.1*  MCV 103.4*  PLT 518   Basic Metabolic Panel: Recent Labs  Lab 03/18/18 1315  NA 136  K 4.6  CL 92*  CO2 28  GLUCOSE 170*  BUN 47*  CREATININE 1.32*  CALCIUM 8.7*   GFR: Estimated Creatinine Clearance: 68.4 mL/min (A) (by C-G formula based on SCr of 1.32 mg/dL (H)). Liver Function Tests: Recent Labs  Lab 03/18/18 1315  AST 70*  ALT 48  ALKPHOS 53  BILITOT 1.1  PROT 6.6  ALBUMIN 3.0*   Recent Labs  Lab 03/18/18 1315  LIPASE 22   No results for input(s): AMMONIA in the last 168 hours. Coagulation Profile: No results for input(s): INR, PROTIME in the last 168 hours. Cardiac Enzymes: No results for input(s): CKTOTAL, CKMB, CKMBINDEX, TROPONINI in the last 168 hours. BNP (last 3 results) No results for input(s): PROBNP in the last 8760 hours. HbA1C: No results for input(s): HGBA1C in the last 72 hours. CBG: No results for input(s): GLUCAP in the last 168 hours. Lipid Profile: No results for input(s): CHOL, HDL, LDLCALC, TRIG, CHOLHDL, LDLDIRECT in the last 72 hours. Thyroid  Function Tests: No results for input(s): TSH, T4TOTAL, FREET4, T3FREE, THYROIDAB in the last 72 hours. Anemia Panel: No results for input(s): VITAMINB12, FOLATE, FERRITIN, TIBC, IRON, RETICCTPCT in the last 72 hours. Urine analysis:    Component Value Date/Time   COLORURINE YELLOW 08/28/2016 2059   APPEARANCEUR CLEAR 08/28/2016 2059   LABSPEC 1.010 08/28/2016 2059   PHURINE 5.5 08/28/2016 2059   GLUCOSEU NEGATIVE 08/28/2016 2059   GLUCOSEU  NEGATIVE 07/01/2015 0910   HGBUR SMALL (A) 08/28/2016 2059   BILIRUBINUR NEGATIVE 08/28/2016 2059   KETONESUR NEGATIVE 08/28/2016 2059   PROTEINUR NEGATIVE 08/28/2016 2059   UROBILINOGEN 0.2 07/01/2015 0910   NITRITE NEGATIVE 08/28/2016 2059   LEUKOCYTESUR NEGATIVE 08/28/2016 2059    Radiological Exams on Admission: No results found.  EKG: Independently reviewed. None performed  Assessment/Plan Principal Problem:   GIB (gastrointestinal bleeding) Active Problems:   Diabetes mellitus without complication (HCC)   Dyslipidemia   Morbid obesity (HCC)   Essential hypertension   Chronic diastolic CHF (congestive heart failure) (HCC)   Hypoxemia   Coronary atherosclerosis of native coronary artery   OSA (obstructive sleep apnea)   COPD (chronic obstructive pulmonary disease) (HCC)   PND (paroxysmal nocturnal dyspnea)   Cancer of cardio-esophageal junction ([place])   Port catheter in place    #) Acute on chronic anemia due to GI bleed: Most likely sources gastrointestinal losses possibly from a upper GI source.  He has had an EGD in 2017 that diagnosed a squamous cell cancer but none since then.  He denies any dysphasia or difficulty swallowing or any weight loss.  At least for the last CT scans done approximately 3 months ago his cancer was quiescent. -IV transfusion of blood until hemoglobin is greater than 7 - Every 6 hours H&H -GI consult -N.p.o. midnight except clears -IV famotidine  #) Mild AKI: Likely secondary to dehydration.  Patient has received IV fluids in the emergency department. -Hold home furosemide -Blood transfusion  #) Type 2 diabetes on insulin: - Continue NPH 10 units twice daily, patient is on 16 units at home twice daily -Hold insulin lispro 20 units twice daily with meals -Sliding scale insulin, AC at bedtime -Hold metformin -Carb restricted diet  #) Hyperlipidemia: -Continue atorvastatin 80 mg daily -Continue Zetia 10 mg daily  #) Coronary artery  disease: -Hold aspirin 81 mg daily -Continue statin  #) Pain/psych: -Continue baclofen 10 mg 3 times daily as needed -Continue citalopram 20 mg daily -Continue gabapentin 300 mg 3 times daily -Continue hydroxyzine nightly as needed  #) OSA: -Continue CPAP   Fluids: Tolerating p.o. Electrolyte: Monitor and supplement Nutrition: Carb/heart healthy diet, n.p.o. at midnight  Prophylaxis: SCDs  Disposition: Pending GI evaluation  DO NOT RESUSCITATE     Cristy Folks MD [facility]   If 7PM-7AM, please contact night-coverage [email or web address] Password [facility]  03/18/2018, 3:15 PM

## 2018-03-18 NOTE — ED Provider Notes (Signed)
[facility] DEPT Provider Note   CSN: [id number] Arrival date & time: 03/18/18  1139     History   Chief Complaint Chief Complaint  Patient presents with  . Rectal Bleeding  . Emesis    HPI [name] is a 72 y.o. male.  Complaint is history of esophageal cancer, vomiting with black emesis, and black stools.  HPI 72 year old male.  History of esophageal cancer.  His GI physician is Dr. Hilarie Fredrickson.  Is status post chemotherapy and radiation therapy.  Has not had treatment for 6 months.  Never required feeding tube.  Has never had GI bleeding that he is aware of.  Symptoms started 2 days ago with black stools.  Since morning had some black emesis.  No frank bloody stool or bloody emesis.  He is weak and dizzy and lightheaded but not syncopal.  Past Medical History:  Diagnosis Date  . Asthma    as a child  . CAD in native artery    a. cardiac cath 09/2013 showed severely diseased mLAD and diagonal, mild LCx disease, moderate RCA disease, LVEDP 20, EF 50%.  . Cataract    multiple types, bilateral  . Cholelithiasis without cholecystitis   . Chronic diastolic CHF (congestive heart failure) ([place]) 02/25/2009  . Chronic renal insufficiency, stage III (moderate) (HCC) 2017   Stage II/III (GFR around 60)  . 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  CT 11/2017 w/ascites and persistent bilat pleural effusions  . 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 COPD (chronic obstructive pulmonary disease) ([place])   . Coronary artery disease    chronically occluded LAD and diagonal with right to left collaterals, mild disease in the left circ and moderate disease in the mid RCA on medical management with Imdur, ASA, and Plavix.  . Dermatitis 05/2014  . DIABETES MELLITUS, TYPE II 06/24/2007   No diab retpthy as of 08/05/15 eye exam.  . Esophageal cancer ([place]) 04/2016   poorly differentiated  carcinoma (Dr. Pyrtle--EGD).  CT C/A/P showed metastatic adenopathy in mediastinum and upper abdomen 05/09/16.  Tx plan is palliative radiation (completed 06/22/16), then palliative systemic chemotherapy (carbo+taxol) was started but as of 08/03/16 onc f/u this was held due to severe knee and ankle arthralgias.  Restarting as of 10/2015 (08/2016 CT showed some disease regression  . Esophageal cancer ([place])    CT 12/2016 showed no residual esoph mass--plan to continue chemo.  Ongoing palliative chemo with carboplatin and taxol q 2 wks as of 03/2017 onc f/u (CT 12/2016 and 04/2017 showed no progression of dz).  Taking a 2 mo break from chemo as of 05/2017.  Pleural effusion-- improved with lasix but onc wanted him to get dx/therap thoracentesis-pt declined.  CT C/A/P no progression/recurr/mets 11/2017.  Marland Kitchen Essential hypertension 05/01/2007   Qualifier: Diagnosis of  By: Tiney Rouge CMA, Ellison Hughs     . History of kidney stones   . Hyperkalemia 12/2015   Decreased ACE-I by 50% in response, then potassium normalized.  Marland Kitchen HYPERLIPIDEMIA 06/24/2007  . Hypoxemia 01/27/2010  . Morbid obesity ([place])   . Myocardial infarction [facility])    pt states he was informed per MD that he has had one but pt was unaware   . Obesity hypoventilation syndrome (HCC)    oxygen 24/7 (2 liters [place] as of 02/2016)  . On home oxygen therapy    Oxygen @ 2l/m nasally 24/7 hours  .  OSA (obstructive sleep apnea)    not tested; pt scored 4 per stop bang tool results sent to PCP   . OSTEOARTHRITIS 05/01/2007  . Pancytopenia due to chemotherapy ([place]) 2018  . Pleural effusion on right 08/2017   ? malignant vs CHF: pt refuses diagnostic thoracentesis b/c he states he feels fine, wants to try diuretic first.  . Pleural effusion on right 09/2017   Thoracentesis  . Presbycusis of both ears 05/2015   [facility]  . Pruritic condition 05/2014   Allergist summer 2015, no new testing.  Marland Kitchen RBBB   . Visual field defect 05/10/2016   Per Dr. Melina Fiddler, O.D.: Rt, loss of  inf/temp quad and some loss sup/temp.  ?CVA  ? Pituitary tumor? 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    Patient Active Problem List   Diagnosis Date Noted  . DNR (do not resuscitate) 05/04/2017  . Goals of care, counseling/discussion 11/02/2016  . Port catheter in place 10/05/2016  . Malnutrition of moderate degree 08/29/2016  . Hypoglycemia   . Dehydration   . Cancer of cardio-esophageal junction ([place]) 05/18/2016  . Mass of esophagus determined by endoscopy   . O2 dependent 04/10/2016  . IDDM (insulin dependent diabetes mellitus) ([place]) 04/10/2016  . Dysphagia 03/22/2016  . PND (paroxysmal nocturnal dyspnea)   . Dyslipidemia associated with type 2 diabetes mellitus ([place])   . DM (diabetes mellitus) type II controlled peripheral vascular disorder   . Spasm of muscle, back 06/20/2015  . Right knee pain 10/26/2014  . Scabies 09/10/2014  . Impaired functional mobility and activity tolerance 08/26/2014  . Recurrent falls 08/26/2014  . Other emphysema ([place]) 08/26/2014  . COPD (chronic obstructive pulmonary disease) ([place])   . Rash and nonspecific skin eruption 06/27/2014  . Pruritic condition 05/18/2014  . Adjustment disorder with anxious mood 04/25/2014  . Itchy skin 04/25/2014  . OSA (obstructive sleep apnea) 02/24/2014  . Coronary atherosclerosis of native coronary artery 11/10/2013  . Abnormal cardiovascular stress test 10/16/2013  . DOE (dyspnea on exertion) 09/14/2013  . HYPOXEMIA 01/27/2010  . Chronic diastolic CHF (congestive heart failure) ([place]) 02/25/2009  . Morbid obesity ([place]) 09/02/2008  . Diabetes mellitus without complication ([place]) 13/24/4010  . Dyslipidemia 06/24/2007  . Essential hypertension 05/01/2007  . OSTEOARTHRITIS 05/01/2007    Past Surgical History:  Procedure Laterality Date  . APPENDECTOMY    . BALLOON DILATION N/A 05/04/2016   Procedure: BALLOON DILATION;  Surgeon: Jerene Bears, MD;  Location: [facility] ENDOSCOPY;   Service: Gastroenterology;  Laterality: N/A;  . CARDIAC CATHETERIZATION    . CARPAL TUNNEL RELEASE Left   . CATARACT EXTRACTION W/PHACO Right 04/29/2013   Procedure: CATARACT EXTRACTION PHACO AND INTRAOCULAR LENS PLACEMENT (IOC);  Surgeon: Adonis Brook, MD;  Location: [facility];  Service: Ophthalmology;  Laterality: Right;  . CATARACT EXTRACTION, BILATERAL    . COLONOSCOPY W/ POLYPECTOMY  08/2011   Many polyps--all hyperplastic, severe diverticulosis, int hem.  [employer] hemoccult testing via [facility] 06/22/15 NEG  . ESOPHAGOGASTRODUODENOSCOPY N/A 05/04/2016   Procedure: ESOPHAGOGASTRODUODENOSCOPY (EGD);  Surgeon: Jerene Bears, MD;  Location: [facility];  Service: Gastroenterology;  Laterality: N/A;  . IR GENERIC HISTORICAL  10/03/2016   IR US GUIDE VASC ACCESS RIGHT 10/03/2016 Greggory Keen, MD [facility]  . IR GENERIC HISTORICAL  10/03/2016   IR FLUORO GUIDE PORT INSERTION RIGHT 10/03/2016 Greggory Keen, MD [facility]  . KNEE ARTHROSCOPY WITH MEDIAL MENISECTOMY Right 12/16/2014   Procedure: RIGHT KNEE  ARTHROSCOPY WITH MEDIAL MENISECTOMY microfracture medial femoral condyle abrasion condroplasty medial femoral condyle lateral menisectomy;  Surgeon: Tobi Bastos, MD;  Location: [facility] ORS;  Service: Orthopedics;  Laterality: Right;  . LEFT HEART CATHETERIZATION WITH CORONARY ANGIOGRAM N/A 10/26/2013   Procedure: LEFT HEART CATHETERIZATION WITH CORONARY ANGIOGRAM;  Surgeon: Jettie Booze, MD;  Location: [facility] CATH LAB;  Service: Cardiovascular;  Laterality: N/A;  . LUMBAR [place] SURGERY  2001  . SHOULDER SURGERY Right    right fx  . TONSILLECTOMY    . TRANSTHORACIC ECHOCARDIOGRAM  03/2016   EF 55-60%, grade I DD, LAE  . WRIST SURGERY Right    right fx        Home Medications    Prior to Admission medications   Medication Sig Start Date End Date Taking? Authorizing Provider  aspirin 81 MG tablet Take 81 mg by mouth daily.     Yes [provider]  atorvastatin (LIPITOR) 80 MG tablet  TAKE 1 TABLET BY MOUTH ONCE DAILY 10/01/17  Yes Turner, Eber Hong, MD  baclofen (LIORESAL) 10 MG tablet 1/2 - 1 tab po q8h prn muscle spasms 07/26/17  Yes McGowen, Adrian Blackwater, MD  beta carotene w/minerals (OCUVITE) tablet Take 1 tablet by mouth daily.   Yes [provider]  cetirizine (ZYRTEC) 10 MG tablet Take 10 mg by mouth daily.   Yes [provider]  citalopram (CELEXA) 20 MG tablet TAKE 1 TABLET BY MOUTH ONCE DAILY 01/16/18  Yes McGowen, Adrian Blackwater, MD  clonazePAM (KLONOPIN) 1 MG tablet TAKE 1 TABLET BY MOUTH TWICE DAILY AS NEEDED FOR  ANXIETY 01/16/18  Yes McGowen, Adrian Blackwater, MD  cromolyn (OPTICROM) 4 % ophthalmic solution Place 1 drop into both eyes. 3 to 4 times daily 11/05/17  Yes [provider]  ezetimibe (ZETIA) 10 MG tablet Take 1 tablet (10 mg total) by mouth daily. 04/09/17 03/18/18 Yes Turner, Eber Hong, MD  furosemide (LASIX) 20 MG tablet Take 1 tablet (20 mg total) by mouth daily. 11/15/17  Yes McGowen, Adrian Blackwater, MD  gabapentin (NEURONTIN) 300 MG capsule TAKE ONE CAPSULE BY MOUTH THREE TIMES DAILY 11/18/17  Yes McGowen, Adrian Blackwater, MD  HUMULIN N KWIKPEN 100 UNIT/ML Kiwkpen INJECT 16 UNITS EVERY MORNING AND 16 UNITS EVERY EVENING 11/06/17  Yes McGowen, Adrian Blackwater, MD  hydrOXYzine (ATARAX/VISTARIL) 25 MG tablet TAKE 1 TABLET BY MOUTH AT SUPPER AND 1 TAB AT BEDTIME FOR INSOMNIA 05/31/17  Yes McGowen, Adrian Blackwater, MD  Insulin Lispro (HUMALOG KWIKPEN) 200 UNIT/ML SOPN Inject 20 Units into the skin 2 (two) times daily with a meal. Patient taking differently: Inject 16 Units into the skin 2 (two) times daily with a meal. Takes  16 units  Twice  Daily. 04/25/17  Yes McGowen, Adrian Blackwater, MD  magnesium chloride (SLOW-MAG) 64 MG TBEC SR tablet Take 1 tablet (64 mg total) by mouth daily. 09/01/16  Yes Eber Jones, MD  metFORMIN (GLUCOPHAGE) 1000 MG tablet TAKE 1 TABLET BY MOUTH TWICE DAILY WITH MEALS 02/25/18  Yes McGowen, Adrian Blackwater, MD  Omega-3 Fatty Acids ([employer] FISH OIL) 1000 MG CAPS Take 2  tablets by mouth 2 (two) times daily. 10/18/14  Yes Turner, Eber Hong, MD  OXYGEN Inhale 2 L/min into the lungs continuous.   Yes [provider]  pantoprazole (PROTONIX) 40 MG tablet Take 1 tablet (40 mg total) by mouth 2 (two) times daily. 02/24/18  Yes McGowen, Adrian Blackwater, MD  clotrimazole-betamethasone (LOTRISONE) cream Apply to affected area twice daily Patient  not taking: Reported on 03/18/2018 06/19/17   Tammi Sou, MD  Insulin NPH, Human,, Isophane, (HUMULIN N) 100 UNIT/ML Kiwkpen 16 units every morning and 16 units every evening Patient not taking: Reported on 03/18/2018 12/31/17   Tammi Sou, MD  ONE TOUCH ULTRA TEST test strip USE  STRIP TO CHECK GLUCOSE THREE TIMES DAILY AS  DIRECTED 04/05/17   McGowen, Adrian Blackwater, MD  [facility] DELICA LANCETS 25K MISC USE ONE  TO CHECK GLUCOSE THREE TIMES DAILY 02/26/17   McGowen, Adrian Blackwater, MD  oxyCODONE (OXY IR/ROXICODONE) 5 MG immediate release tablet Take 1 tablet (5 mg total) by mouth every 8 (eight) hours as needed for severe pain. Patient not taking: Reported on 03/18/2018 02/06/17   Truitt Merle, MD    Family History Family History  Problem Relation Age of Onset  . Heart attack Mother   . Aneurysm Father   . Alcohol abuse Father   . Diabetes Maternal Aunt   . Colon cancer Neg Hx     Social History Social History   Tobacco Use  . Smoking status: Former Smoker    Packs/day: 1.50    Years: 30.00    Pack years: 45.00    Types: Cigarettes, Pipe, Cigars    Last attempt to quit: 10/30/1983    Years since quitting: 34.4  . Smokeless tobacco: Current User    Types: Chew    Last attempt to quit: 05/15/2016  Substance Use Topics  . Alcohol use: No  . Drug use: No     Allergies   Patient has no known allergies.   Review of Systems Review of Systems  Constitutional: Negative for appetite change, chills, diaphoresis, fatigue and fever.  HENT: Negative for mouth sores, sore throat and trouble swallowing.   Eyes: Negative for  visual disturbance.  Respiratory: Negative for cough, chest tightness, shortness of breath and wheezing.   Cardiovascular: Negative for chest pain.  Gastrointestinal: Positive for nausea and vomiting. Negative for abdominal distention, abdominal pain and diarrhea.       Black emesis and black stool.  Endocrine: Negative for polydipsia, polyphagia and polyuria.  Genitourinary: Negative for dysuria, frequency and hematuria.  Musculoskeletal: Negative for gait problem.  Skin: Negative for color change, pallor and rash.  Neurological: Negative for dizziness, syncope, light-headedness and headaches.  Hematological: Does not bruise/bleed easily.  Psychiatric/Behavioral: Negative for behavioral problems and confusion.     Physical Exam Updated Vital Signs BP 120/72   Pulse (!) 103   Temp 98.3 F (36.8 C) (Oral)   Resp 18   Ht '5\' 9"'  (1.753 m)   Wt 132.9 kg (293 lb)   SpO2 94%   BMI 43.27 kg/m   Physical Exam  Constitutional: He is oriented to person, place, and time. He appears well-developed and well-nourished. No distress.  HENT:  Head: Normocephalic.  Eyes: Pupils are equal, round, and reactive to light. Conjunctivae are normal. No scleral icterus.  Conjunctiva pale  Neck: Normal range of motion. Neck supple. No thyromegaly present.  Cardiovascular: Normal rate and regular rhythm. Exam reveals no gallop and no friction rub.  No murmur heard. Pulmonary/Chest: Effort normal and breath sounds normal. No respiratory distress. He has no wheezes. He has no rales.  Abdominal: Soft. Bowel sounds are normal. He exhibits no distension. There is no tenderness. There is no rebound.  Musculoskeletal: Normal range of motion.  Neurological: He is alert and oriented to person, place, and time.  Skin: Skin is warm and dry. No rash  noted.  Psychiatric: He has a normal mood and affect. His behavior is normal.     ED Treatments / Results  Labs (all labs ordered are listed, but only abnormal  results are displayed) Labs Reviewed  COMPREHENSIVE METABOLIC PANEL - Abnormal; Notable for the following components:      Result Value   Chloride 92 (*)    Glucose, Bld 170 (*)    BUN 47 (*)    Creatinine, Ser 1.32 (*)    Calcium 8.7 (*)    Albumin 3.0 (*)    AST 70 (*)    GFR calc non Af Amer 52 (*)    Anion gap 16 (*)    All other components within normal limits  CBC - Abnormal; Notable for the following components:   WBC 11.3 (*)    RBC 2.04 (*)    Hemoglobin 6.6 (*)    HCT 21.1 (*)    MCV 103.4 (*)    RDW 16.7 (*)    All other components within normal limits  POC OCCULT BLOOD, ED - Abnormal; Notable for the following components:   Fecal Occult Bld POSITIVE (*)    All other components within normal limits  LIPASE, BLOOD  URINALYSIS, ROUTINE W REFLEX MICROSCOPIC  TYPE AND SCREEN  PREPARE RBC (CROSSMATCH)    EKG None  Radiology No results found.  Procedures Procedures (including critical care time)  Medications Ordered in ED Medications  0.9 %  sodium chloride infusion (has no administration in time range)  famotidine (PEPCID) IVPB 20 mg premix (has no administration in time range)     Initial Impression / Assessment and Plan / ED Course  I have reviewed the triage vital signs and the nursing notes.  Pertinent labs & imaging results that were available during my care of the patient were reviewed by me and considered in my medical decision making (see chart for details).    Hemoglobin 6.6.  Type and screen ordered.  Crossmatch and fusion ordered.  Given IV Pepcid.  I placed a call to his gastroenterologist.  Discussed case with hospitalist.  He will be admitted for symptomatic anemia and upper GI bleed  Final Clinical Impressions(s) / ED Diagnoses   Final diagnoses:  Upper GI bleed    ED Discharge Orders    None       Tanna Furry, MD 03/18/18 1456

## 2018-03-18 NOTE — ED Notes (Signed)
Failed attempt to collect UA sample again. Placed pt on bed pan and was not able to urinate just pass gas.

## 2018-03-18 NOTE — ED Notes (Signed)
RN notified of abnormal lab 

## 2018-03-18 NOTE — ED Notes (Signed)
Pt could not stand. Was staggering and not able to stand for long.

## 2018-03-18 NOTE — Consult Note (Addendum)
[facility] Consult: 3:04 PM 03/18/2018  LOS: 0 days    Referring Provider: Dr Jeneen Rinks  Primary Care Physician:  Tammi Sou, MD Primary Gastroenterologist:  Dr. Hilarie Fredrickson.    Oncologist:   DR Burr Medico   Reason for Consultation:  Black stools, black emesis, anemia.     HPI: [name] is a 72 y.o. male.  History CAD.  Diastolic CHF.  Stage 3 CKD.  Nonalcoholic cirrhosis, splenomegaly, ascites. DM2.  COPD. Home oxygen.  Morbid obesity.  OSA. HTN.   Thrombocytopenia. Colonoscopy 2012 Screening study.  A total of 8 polyps were removed.  Dense sigmoid to descending colon diverticulosis, internal hemorrhoids.  Pathology on all the polyps was hyperplastic polyps.    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  EGD 05/04/2016 showed large, ulcerated mass at [place] without evidence for recent or active bleeding at the GE junction.  Also seen were erosions in the gastric antrum and prepylorus and mild duodenal bulb erythema.   Treated with palliative radiation and chemo.   CT 12/2016 showed no residual esophageal mass and the plan was to continue palliative chemo with carboplatin and Taxol. Latest CT chest/abdomen/pelvis 12/05/2017.  Stable exam.  No esophageal mass or mediastinal adenopathy.  Cirrhosis with ascites.  Has enteric edema.  Gallstones bilateral pleural effusions.  Three-vessel CAD.  Degenerative disc disease but no lytic or sclerotic bony changes. He is been off chemotherapy since 06/2017 and followed by Dr. Annamaria Boots, last visit was 02/10/2018.  Even the patient's lack of symptoms, i.e. no dysphagia, Dr. Annamaria Boots did not feel that patient required repeat upper endoscopy and was planning repeat CT scan in July or August 2019.  Current problems began on Saturday when he started having dark and  bloody stools.  He did not have abdominal pain.  Yesterday in the afternoon/evening he started vomiting dark material.  He denies recurrence of dark emesis today.  Today he feels dizzy/woozy.  Since the stools changed on Saturday, he has had weakness and increased dyspnea on exertion to where he cannot walk down his hallway without significant shortness of breath and tacky cardia.   He takes Protonix 40 mg p.o. twice daily.  He uses ibuprofen 600 mg may be twice a week. Family called oncology office this morning and he was advised to go to the ED. BP 104/74, pulse 115. Hgb 6.6, was 10.7 on 02/10/2018.  MCV 103.  Platelets 164. FOBT+.   AKI with BUN 47, creatinine 1.3.  With the exception of an AST of 70, and hypoalbuminemia his LFTs are normal.   Up until the weekend, patient had no GI problems.  Was not having dysphagia, heartburn, nausea, anorexia, weight fluctuation, dark or loose stools.  Past Medical History:  Diagnosis Date  . Asthma    as a child  . CAD in native artery    a. cardiac cath 09/2013 showed severely diseased mLAD and diagonal, mild LCx disease, moderate RCA disease, LVEDP 20, EF 50%.  . Cataract    multiple types, bilateral  . Cholelithiasis without  cholecystitis   . Chronic diastolic CHF (congestive heart failure) ([place]) 02/25/2009  . Chronic renal insufficiency, stage III (moderate) (HCC) 2017   Stage II/III (GFR around 60)  . Cirrhosis, nonalcoholic ([place]) 6962   with splenomegaly.  CT 11/2017 w/ascites and persistent bilat pleural effusions  . 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 COPD (chronic obstructive pulmonary disease) ([place])   . Coronary artery disease    chronically occluded LAD and diagonal with right to left collaterals, mild disease in the left circ and moderate disease in the mid RCA on medical management with Imdur, ASA, and Plavix.  . Dermatitis 05/2014  . DIABETES MELLITUS, TYPE II 06/24/2007     No diab retpthy as of 08/05/15 eye exam.  . Esophageal cancer ([place]) 04/2016   poorly differentiated carcinoma (Dr. Pyrtle--EGD).  CT C/A/P showed metastatic adenopathy in mediastinum and upper abdomen 05/09/16.  Tx plan is palliative radiation (completed 06/22/16), then palliative systemic chemotherapy (carbo+taxol) was started but as of 08/03/16 onc f/u this was held due to severe knee and ankle arthralgias.  Restarting as of 10/2015 (08/2016 CT showed some disease regression  . Esophageal cancer ([place])    CT 12/2016 showed no residual esoph mass--plan to continue chemo.  Ongoing palliative chemo with carboplatin and taxol q 2 wks as of 03/2017 onc f/u (CT 12/2016 and 04/2017 showed no progression of dz).  Taking a 2 mo break from chemo as of 05/2017.  Pleural effusion-- improved with lasix but onc wanted him to get dx/therap thoracentesis-pt declined.  CT C/A/P no progression/recurr/mets 11/2017.  Marland Kitchen Essential hypertension 05/01/2007   Qualifier: Diagnosis of  By: Tiney Rouge CMA, Ellison Hughs     . History of kidney stones   . Hyperkalemia 12/2015   Decreased ACE-I by 50% in response, then potassium normalized.  Marland Kitchen HYPERLIPIDEMIA 06/24/2007  . Hypoxemia 01/27/2010  . Morbid obesity ([place])   . Myocardial infarction [facility])    pt states he was informed per MD that he has had one but pt was unaware   . Obesity hypoventilation syndrome (HCC)    oxygen 24/7 (2 liters [place] as of 02/2016)  . On home oxygen therapy    Oxygen @ 2l/m nasally 24/7 hours  . OSA (obstructive sleep apnea)    not tested; pt scored 4 per stop bang tool results sent to PCP   . OSTEOARTHRITIS 05/01/2007  . Pancytopenia due to chemotherapy ([place]) 2018  . Pleural effusion on right 08/2017   ? malignant vs CHF: pt refuses diagnostic thoracentesis b/c he states he feels fine, wants to try diuretic first.  . Pleural effusion on right 09/2017   Thoracentesis  . Presbycusis of both ears 05/2015   [facility]  . Pruritic condition 05/2014   Allergist summer 2015,  no new testing.  Marland Kitchen RBBB   . Visual field defect 05/10/2016   Per Dr. Melina Fiddler, O.D.: Rt, loss of inf/temp quad and some loss sup/temp.  ?CVA  ? Pituitary tumor? 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    Past Surgical History:  Procedure Laterality Date  . APPENDECTOMY    . BALLOON DILATION N/A 05/04/2016   Procedure: BALLOON DILATION;  Surgeon: Jerene Bears, MD;  Location: [facility];  Service: Gastroenterology;  Laterality: N/A;  . CARDIAC CATHETERIZATION    . CARPAL TUNNEL RELEASE Left   . CATARACT EXTRACTION W/PHACO Right 04/29/2013  Procedure: CATARACT EXTRACTION PHACO AND INTRAOCULAR LENS PLACEMENT (IOC);  Surgeon: Adonis Brook, MD;  Location: [facility];  Service: Ophthalmology;  Laterality: Right;  . CATARACT EXTRACTION, BILATERAL    . COLONOSCOPY W/ POLYPECTOMY  08/2011   Many polyps--all hyperplastic, severe diverticulosis, int hem.  [employer] hemoccult testing via [facility] 06/22/15 NEG  . ESOPHAGOGASTRODUODENOSCOPY N/A 05/04/2016   Procedure: ESOPHAGOGASTRODUODENOSCOPY (EGD);  Surgeon: Jerene Bears, MD;  Location: [facility];  Service: Gastroenterology;  Laterality: N/A;  . IR GENERIC HISTORICAL  10/03/2016   IR US GUIDE VASC ACCESS RIGHT 10/03/2016 Greggory Keen, MD [facility]  . IR GENERIC HISTORICAL  10/03/2016   IR FLUORO GUIDE PORT INSERTION RIGHT 10/03/2016 Greggory Keen, MD [facility]  . KNEE ARTHROSCOPY WITH MEDIAL MENISECTOMY Right 12/16/2014   Procedure: RIGHT KNEE ARTHROSCOPY WITH MEDIAL MENISECTOMY microfracture medial femoral condyle abrasion condroplasty medial femoral condyle lateral menisectomy;  Surgeon: Tobi Bastos, MD;  Location: [facility];  Service: Orthopedics;  Laterality: Right;  . LEFT HEART CATHETERIZATION WITH CORONARY ANGIOGRAM N/A 10/26/2013   Procedure: LEFT HEART CATHETERIZATION WITH CORONARY ANGIOGRAM;  Surgeon: Jettie Booze, MD;  Location: [facility];  Service: Cardiovascular;  Laterality: N/A;  . LUMBAR [place]  [facility]  2001  . SHOULDER SURGERY Right    right fx  . TONSILLECTOMY    . TRANSTHORACIC ECHOCARDIOGRAM  03/2016   EF 55-60%, grade I DD, LAE  . WRIST SURGERY Right    right fx    Prior to Admission medications   Medication Sig Start Date End Date Taking? Authorizing Provider  aspirin 81 MG tablet Take 81 mg by mouth daily.     Yes [provider]  atorvastatin (LIPITOR) 80 MG tablet TAKE 1 TABLET BY MOUTH ONCE DAILY 10/01/17  Yes Turner, Eber Hong, MD  baclofen (LIORESAL) 10 MG tablet 1/2 - 1 tab po q8h prn muscle spasms 07/26/17  Yes McGowen, Adrian Blackwater, MD  beta carotene w/minerals (OCUVITE) tablet Take 1 tablet by mouth daily.   Yes [provider]  cetirizine (ZYRTEC) 10 MG tablet Take 10 mg by mouth daily.   Yes [provider]  citalopram (CELEXA) 20 MG tablet TAKE 1 TABLET BY MOUTH ONCE DAILY 01/16/18  Yes McGowen, Adrian Blackwater, MD  clonazePAM (KLONOPIN) 1 MG tablet TAKE 1 TABLET BY MOUTH TWICE DAILY AS NEEDED FOR  ANXIETY 01/16/18  Yes McGowen, Adrian Blackwater, MD  cromolyn (OPTICROM) 4 % ophthalmic solution Place 1 drop into both eyes. 3 to 4 times daily 11/05/17  Yes [provider]  ezetimibe (ZETIA) 10 MG tablet Take 1 tablet (10 mg total) by mouth daily. 04/09/17 03/18/18 Yes Turner, Eber Hong, MD  furosemide (LASIX) 20 MG tablet Take 1 tablet (20 mg total) by mouth daily. 11/15/17  Yes McGowen, Adrian Blackwater, MD  gabapentin (NEURONTIN) 300 MG capsule TAKE ONE CAPSULE BY MOUTH THREE TIMES DAILY 11/18/17  Yes McGowen, Adrian Blackwater, MD  HUMULIN N KWIKPEN 100 UNIT/ML Kiwkpen INJECT 16 UNITS EVERY MORNING AND 16 UNITS EVERY EVENING 11/06/17  Yes McGowen, Adrian Blackwater, MD  hydrOXYzine (ATARAX/VISTARIL) 25 MG tablet TAKE 1 TABLET BY MOUTH AT SUPPER AND 1 TAB AT BEDTIME FOR INSOMNIA 05/31/17  Yes McGowen, Adrian Blackwater, MD  Insulin Lispro (HUMALOG KWIKPEN) 200 UNIT/ML SOPN Inject 20 Units into the skin 2 (two) times daily with a meal. Patient taking differently: Inject 16 Units into the skin 2  (two) times daily with a meal. Takes  16 units  Twice  Daily. 04/25/17  Yes McGowen, Adrian Blackwater, MD  magnesium chloride (SLOW-MAG) 64 MG TBEC SR tablet Take 1 tablet (64 mg total) by mouth daily. 09/01/16  Yes Eber Jones, MD  metFORMIN (GLUCOPHAGE) 1000 MG tablet TAKE 1 TABLET BY MOUTH TWICE DAILY WITH MEALS 02/25/18  Yes McGowen, Adrian Blackwater, MD  Omega-3 Fatty Acids ([employer] FISH OIL) 1000 MG CAPS Take 2 tablets by mouth 2 (two) times daily. 10/18/14  Yes Turner, Eber Hong, MD  OXYGEN Inhale 2 L/min into the lungs continuous.   Yes [provider]  pantoprazole (PROTONIX) 40 MG tablet Take 1 tablet (40 mg total) by mouth 2 (two) times daily. 02/24/18  Yes McGowen, Adrian Blackwater, MD  clotrimazole-betamethasone (LOTRISONE) cream Apply to affected area twice daily Patient not taking: Reported on 03/18/2018 06/19/17   Tammi Sou, MD  Insulin NPH, Human,, Isophane, (HUMULIN N) 100 UNIT/ML Kiwkpen 16 units every morning and 16 units every evening Patient not taking: Reported on 03/18/2018 12/31/17   Tammi Sou, MD  ONE TOUCH ULTRA TEST test strip USE  STRIP TO CHECK GLUCOSE THREE TIMES DAILY AS  DIRECTED 04/05/17   McGowen, Adrian Blackwater, MD  [facility] DELICA LANCETS 32T MISC USE ONE  TO CHECK GLUCOSE THREE TIMES DAILY 02/26/17   McGowen, Adrian Blackwater, MD  oxyCODONE (OXY IR/ROXICODONE) 5 MG immediate release tablet Take 1 tablet (5 mg total) by mouth every 8 (eight) hours as needed for severe pain. Patient not taking: Reported on 03/18/2018 02/06/17   Truitt Merle, MD    Scheduled Meds:  Infusions: . sodium chloride    . famotidine (PEPCID) IV     PRN Meds:    Allergies as of 03/18/2018  . (No Known Allergies)    Family History  Problem Relation Age of Onset  . Heart attack Mother   . Aneurysm Father   . Alcohol abuse Father   . Diabetes Maternal Aunt   . Colon cancer Neg Hx     Social History   Socioeconomic History  . Marital status: Married    Spouse name: susan  . Number of children:  2  . Years of education: Not on file  . Highest education level: Not on file  Occupational History  . Occupation: Retired  Scientific laboratory technician  . Financial resource strain: Not on file  . Food insecurity:    Worry: Not on file    Inability: Not on file  . Transportation needs:    Medical: Not on file    Non-medical: Not on file  Tobacco Use  . Smoking status: Former Smoker    Packs/day: 1.50    Years: 30.00    Pack years: 45.00    Types: Cigarettes, Pipe, Cigars    Last attempt to quit: 10/30/1983    Years since quitting: 34.4  . Smokeless tobacco: Current User    Types: Chew    Last attempt to quit: 05/15/2016  Substance and Sexual Activity  . Alcohol use: No  . Drug use: No  . Sexual activity: Not Currently  Lifestyle  . Physical activity:    Days per week: Not on file    Minutes per session: Not on file  . Stress: Not on file  Relationships  . Social connections:    Talks on phone: Not on file    Gets together: Not on file    Attends religious service: Not on file    Active member of club or organization: Not on file    Attends meetings of clubs  or organizations: Not on file    Relationship status: Not on file  . Intimate partner violence:    Fear of current or ex partner: Not on file    Emotionally abused: Not on file    Physically abused: Not on file    Forced sexual activity: Not on file  Other Topics Concern  . Not on file  Social History Narrative   Married, one son and one daughter.   His daughter and her two children live with him.   Coffee daily.  Former smoker.  No alcohol.   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   Attends church weekly-   Oxygen continuous    REVIEW OF SYSTEMS: Constitutional: Sedentary lifestyle, the bulk of his days is sitting in the chair watching TV.  Leaves home for shopping a few times a week.  Requires cane/walker.  Becomes short of breath if he walks more than a few 100 feet. ENT:  No nose bleeds.   Diminished hearing Pulm: Worsening dyspnea on exertion as per HPI CV:    Palpitations with activity as per HPI.  Stable LE edema.  No chest pain GU:  No hematuria, no frequency GI:  Per HPI Heme: Other than the gastrointestinal bleeding lately, he has not had any unusual or excessive bleeding or bruising.  He is never been prescribed iron or been treated with B12 orally or by injection. Transfusions: He recalls no previous blood transfusions. Neuro: Some orthostatic dizziness today.  Normally has no headaches, no peripheral tingling or numbness Derm:  No itching, no rash or sores.  Endocrine:  No sweats or chills.  No polyuria or dysuria Immunization: Reviewed.  He is up-to-date on multiple vaccinations including his flu shot. Travel:  None beyond local counties in last few months.    PHYSICAL EXAM: Vital signs in last 24 hours: Vitals:   03/18/18 1208 03/18/18 1300  BP:  120/72  Pulse:  (!) 103  Resp:  18  Temp:    SpO2: 94% 94%   Wt Readings from Last 3 Encounters:  03/18/18 293 lb (132.9 kg)  02/10/18 293 lb 12.8 oz (133.3 kg)  01/22/18 291 lb 6 oz (132.2 kg)    General: Obese, unwell, pale looking WM who is alert.  He is hard of hearing. Head: No facial asymmetry or swelling.  No signs of head trauma. Eyes: Conjunctiva pale.  No scleral icterus.  EOMI. Ears: Hard of hearing Nose: No discharge or congestion. Mouth: Oral mucosa moist and clear.  Tongue midline.  Edentulous. Neck: No JVD, no thyromegaly or masses. Lungs: Greatly reduced breath sounds globally.  No adventitious sounds.  No cough or dyspnea. Heart: RR with tachycardia.  No MRG. Abdomen: Obese.  Soft.  Nontender.  Active bowel sounds..   Rectal: Deferred rectal exam. Musc/Skeltl: No joint redness or swelling. Extremities: Slight pedal edema a little bit more prominent on the right. Neurologic: Alert.  Oriented x3.  No tremor, no asterixis.  Moves all 4 limbs, strength not tested.  Hard of hearing. Skin: No  rashes, sores or suspicious lesions. Nodes: No cervical adenopathy. Psych: Calm, cooperative.  Intake/Output from previous day: No intake/output data recorded. Intake/Output this shift: No intake/output data recorded.  LAB RESULTS: Recent Labs    03/18/18 1315  WBC 11.3*  HGB 6.6*  HCT 21.1*  PLT 164   BMET Lab Results  Component Value Date   NA 136 03/18/2018   NA 138 02/10/2018   NA  137 01/22/2018   K 4.6 03/18/2018   K 4.0 02/10/2018   K 4.3 01/22/2018   CL 92 (L) 03/18/2018   CL 97 (L) 02/10/2018   CL 96 01/22/2018   CO2 28 03/18/2018   CO2 31 (H) 02/10/2018   CO2 33 (H) 01/22/2018   GLUCOSE 170 (H) 03/18/2018   GLUCOSE 125 02/10/2018   GLUCOSE 134 (H) 01/22/2018   BUN 47 (H) 03/18/2018   BUN 13 02/10/2018   BUN 18 01/22/2018   CREATININE 1.32 (H) 03/18/2018   CREATININE 1.19 02/10/2018   CREATININE 1.14 01/22/2018   CALCIUM 8.7 (L) 03/18/2018   CALCIUM 9.0 02/10/2018   CALCIUM 9.2 01/22/2018   LFT Recent Labs    03/18/18 1315  PROT 6.6  ALBUMIN 3.0*  AST 70*  ALT 48  ALKPHOS 53  BILITOT 1.1   PT/INR Lab Results  Component Value Date   INR 1.01 10/03/2016   INR 0.95 08/28/2016   INR 1.0 10/20/2013   Hepatitis Panel No results for input(s): HEPBSAG, HCVAB, HEPAIGM, HEPBIGM in the last 72 hours. C-Diff No components found for: CDIFF Lipase     Component Value Date/Time   LIPASE 22 03/18/2018 1315    Drugs of Abuse  No results found for: LABOPIA, COCAINSCRNUR, LABBENZ, AMPHETMU, THCU, LABBARB   RADIOLOGY STUDIES: No results found.    IMPRESSION:   *   Upper GI bleed.  Rule out esophagitis, recurrent esophageal tumor with ulceration, variceal source, portal hypertensive gastropathy.  *   Blood loss anemia.  Blood transfusions ordered.  *    History esophageal squamous cell carcinoma diagnosed 04/2016.  Has completed palliative radiation and chemotherapy.  CT scan last month did not show any recurrence of adenopathy or esophageal  mass.  *    COPD, OSA.  On home oxygen.    PLAN:     *   Orders placed for upper endoscopy tomorrow.  Tonight he can have ice chips. I am going to request twice daily IV Protonix rather than the Pepcid which is currently ordered.     Azucena Freed  03/18/2018, 3:04 PM Phone [phone]   Attending physician's note   I have taken an interval history, reviewed the chart and examined the patient. I agree with the Advanced Practitioner's note, impression and recommendations.   72 year old with history of CAD, CHF, CKD, DM 2, COPD on home O2, morbid obesity, OSA, HTN, nonalcoholic liver cirrhosis with ascites and thrombocytopenia.  History of poorly differentiated squamous cell esophageal cancer 04/2016, treated with palliative chemo/XRT, CT chest/abdo/pel 12/2016 - neg for residual mass, off chemo since 06/2017, without dysphagia, admitted with hematemesis and melena with NSAID use.  Adm Hb 6.6. plt 164K   Plan: Transfuse hemoglobin of 8-9, IV Protonix, EGD in a.m..  Hold off on IV octreotide since there was no previous esophageal varices, IV antibiotics, avoid nonsteroidals, Trend CBC, check PT, discussed with patient and patient's family.  Carmell Austria, MD

## 2018-03-18 NOTE — ED Notes (Signed)
ED Provider at bedside. 

## 2018-03-18 NOTE — ED Notes (Signed)
ED TO INPATIENT HANDOFF REPORT  Name/Age/Gender [name] 72 y.o. male  Code Status Code Status History    Date Active Date Inactive Code Status Order ID Comments User Context   08/29/2016 1109 08/31/2016 1606 DNR [id number]  Juanito Doom, MD Inpatient   08/28/2016 2031 08/29/2016 1109 Full Code [id number]  Corey Harold, NP ED   03/15/2016 1703 03/16/2016 1654 Full Code [id number]  Florinda Marker, MD Inpatient    Advance Directive Documentation     Most Recent Value  Type of Advance Directive  Healthcare Power of Attorney, Living will  Pre-existing out of facility DNR order (yellow form or pink MOST form)  -  "MOST" Form in Place?  -      Home/SNF/Other Home  Chief Complaint Blood in stool; emesis; cancer patient   Level of Care/Admitting Diagnosis ED Disposition    ED Disposition Condition [place]: [facility] [[id number]]  Level of Care: Med-Surg [16]  Diagnosis: GIB (gastrointestinal bleeding) [983382]  Admitting Physician: Cristy Folks [[id number]]  Attending Physician: Cristy Folks [[id number]]  Estimated length of stay: past midnight tomorrow  Certification:: I certify this patient will need inpatient services for at least 2 midnights  PT Class (Do Not Modify): Inpatient [101]  PT Acc Code (Do Not Modify): Private [1]       Medical History Past Medical History:  Diagnosis Date  . Asthma    as a child  . CAD in native artery    a. cardiac cath 09/2013 showed severely diseased mLAD and diagonal, mild LCx disease, moderate RCA disease, LVEDP 20, EF 50%.  . Cataract    multiple types, bilateral  . Cholelithiasis without cholecystitis   . Chronic diastolic CHF (congestive heart failure) ([place]) 02/25/2009  . Chronic renal insufficiency, stage III (moderate) (HCC) 2017   Stage II/III (GFR around 60)  . 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  CT 11/2017 w/ascites and persistent bilat pleural  effusions  . 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 COPD (chronic obstructive pulmonary disease) ([name])   . Coronary artery disease    chronically occluded LAD and diagonal with right to left collaterals, mild disease in the left circ and moderate disease in the mid RCA on medical management with Imdur, ASA, and Plavix.  . Dermatitis 05/2014  . DIABETES MELLITUS, TYPE II 06/24/2007   No diab retpthy as of 08/05/15 eye exam.  . 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 Pyrtle--EGD).  CT C/A/P showed metastatic adenopathy in mediastinum and upper abdomen 05/09/16.  Tx plan is palliative radiation (completed 06/22/16), then palliative systemic chemotherapy (carbo+taxol) was started but as of 08/03/16 onc f/u this was held due to severe knee and ankle arthralgias.  Restarting as of 10/2015 (08/2016 CT showed some disease regression  . Esophageal cancer ([place])    CT 12/2016 showed no residual esoph mass--plan to continue chemo.  Ongoing palliative chemo with carboplatin and taxol q 2 wks as of 03/2017 onc f/u (CT 12/2016 and 04/2017 showed no progression of dz).  Taking a 2 mo break from chemo as of 05/2017.  Pleural effusion-- improved with lasix but onc wanted him to get dx/therap thoracentesis-pt declined.  CT C/A/P no progression/recurr/mets 11/2017.  Marland Kitchen Essential hypertension 05/01/2007   Qualifier: Diagnosis of  By: Tiney Rouge CMA, Ellison Hughs     . History of  kidney stones   . Hyperkalemia 12/2015   Decreased ACE-I by 50% in response, then potassium normalized.  Marland Kitchen HYPERLIPIDEMIA 06/24/2007  . Hypoxemia 01/27/2010  . Morbid obesity ([place])   . Myocardial infarction [facility])    pt states he was informed per MD that he has had one but pt was unaware   . Obesity hypoventilation syndrome (HCC)    oxygen 24/7 (2 liters [place] as of 02/2016)  . On home oxygen therapy    Oxygen @ 2l/m nasally 24/7 hours  . OSA (obstructive sleep apnea)     not tested; pt scored 4 per stop bang tool results sent to PCP   . OSTEOARTHRITIS 05/01/2007  . Pancytopenia due to chemotherapy ([place]) 2018  . Pleural effusion on right 08/2017   ? malignant vs CHF: pt refuses diagnostic thoracentesis b/c he states he feels fine, wants to try diuretic first.  . Pleural effusion on right 09/2017   Thoracentesis  . Presbycusis of both ears 05/2015   [facility]  . Pruritic condition 05/2014   Allergist summer 2015, no new testing.  Marland Kitchen RBBB   . Visual field defect 05/10/2016   Per Dr. Melina Fiddler, O.D.: Rt, loss of inf/temp quad and some loss sup/temp.  ?CVA  ? Pituitary tumor? 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    Allergies No Known Allergies  IV Location/Drains/Wounds Patient Lines/Drains/Airways Status   Active Line/Drains/Airways    Name:   Placement date:   Placement time:   Site:   Days:   Implanted Port 10/03/16 Right Chest   10/03/16    1535    Chest   531   Peripheral IV 03/18/18 Right Forearm   03/18/18    1534    Forearm   less than 1          Labs/Imaging Results for orders placed or performed during the hospital encounter of 03/18/18 (from the past 48 hour(s))  Lipase, blood     Status: None   Collection Time: 03/18/18  1:15 PM  Result Value Ref Range   Lipase 22 11 - 51 U/L    Comment: Performed at [facility], [place] [address] [place], [place] [address]  Comprehensive metabolic panel     Status: Abnormal   Collection Time: 03/18/18  1:15 PM  Result Value Ref Range   Sodium 136 135 - 145 mmol/L   Potassium 4.6 3.5 - 5.1 mmol/L   Chloride 92 (L) 101 - 111 mmol/L   CO2 28 22 - 32 mmol/L   Glucose, Bld 170 (H) 65 - 99 mg/dL   BUN 47 (H) 6 - 20 mg/dL   Creatinine, Ser 1.32 (H) 0.61 - 1.24 mg/dL   Calcium 8.7 (L) 8.9 - 10.3 mg/dL   Total Protein 6.6 6.5 - 8.1 g/dL   Albumin 3.0 (L) 3.5 - 5.0 g/dL   AST 70 (H) 15 - 41 U/L   ALT 48 17 - 63 U/L   Alkaline Phosphatase 53 38 - 126  U/L   Total Bilirubin 1.1 0.3 - 1.2 mg/dL   GFR calc non Af Amer 52 (L) >60 mL/min   GFR calc Af Amer >60 >60 mL/min    Comment: (NOTE) The eGFR has been calculated using the CKD EPI equation. This calculation has not been validated in all clinical situations. eGFR's persistently <60 mL/min signify possible Chronic Kidney Disease.    Anion gap 16 (H) 5 - 15  Comment: Performed at [facility], [place] [address] [place], [place] [address]  CBC     Status: Abnormal   Collection Time: 03/18/18  1:15 PM  Result Value Ref Range   WBC 11.3 (H) 4.0 - 10.5 K/uL   RBC 2.04 (L) 4.22 - 5.81 MIL/uL   Hemoglobin 6.6 (LL) 13.0 - 17.0 g/dL    Comment: REPEATED TO VERIFY CRITICAL RESULT CALLED TO, READ BACK BY AND VERIFIED WITH: S.WEST AT 1433 ON 03/18/18 BY N.THOMPSON    HCT 21.1 (L) 39.0 - 52.0 %   MCV 103.4 (H) 78.0 - 100.0 fL   MCH 32.4 26.0 - 34.0 pg   MCHC 31.3 30.0 - 36.0 g/dL   RDW 16.7 (H) 11.5 - 15.5 %   Platelets 164 150 - 400 K/uL    Comment: Performed at [facility], [place] [address], [place], [place] [address]  Type and screen [place]     Status: None (Preliminary result)   Collection Time: 03/18/18  1:15 PM  Result Value Ref Range   ABO/RH(D) O POS    Antibody Screen NEG    Sample Expiration 03/21/2018    Unit Number [id number]    Blood Component Type RED CELLS,LR    Unit division 00    Status of Unit ALLOCATED    Transfusion Status OK TO TRANSFUSE    Crossmatch Result Compatible    Unit Number [id number]    Blood Component Type RED CELLS,LR    Unit division 00    Status of Unit ISSUED    Transfusion Status OK TO TRANSFUSE    Crossmatch Result      Compatible Performed at [facility], [place] [address], [place], [place] [address]   POC occult blood, ED     Status: Abnormal   Collection Time: 03/18/18  1:27 PM  Result Value Ref Range   Fecal Occult Bld POSITIVE (A) NEGATIVE  Prepare  RBC     Status: None   Collection Time: 03/18/18  2:48 PM  Result Value Ref Range   Order Confirmation      ORDER PROCESSED BY BLOOD BANK Performed at [facility], [place] [address] [place], [place] [address]    No results found.  Pending Labs Unresulted Labs (From admission, onward)   Start     Ordered   03/19/18 0600  Protime-INR  Once,   R     03/18/18 1909   03/18/18 1211  Urinalysis, Routine w reflex microscopic  STAT,   STAT     03/18/18 1210   Signed and Held  Basic metabolic panel  Tomorrow morning,   R     Signed and Held   Signed and Held  CBC  Tomorrow morning,   R     Signed and Held   Signed and Held  Hemoglobin and hematocrit, blood  Now then every 6 hours,   R     Signed and Held      Vitals/Pain Today's Vitals   03/18/18 1845 03/18/18 1900 03/18/18 1915 03/18/18 1921  BP: (!) 118/51 (!) 119/59 (!) 115/51   Pulse: 99 100 96   Resp: (!) '22 17 14   ' Temp:      TempSrc:      SpO2: 96% 95% 96%   Weight:      Height:      PainSc:    0-No pain    Isolation Precautions No active isolations  Medications Medications  pantoprazole (PROTONIX) injection 40 mg (40 mg Intravenous Given 03/18/18 1911)  0.9 %  sodium chloride infusion (0 mL/hr Intravenous Stopped 03/18/18 1605)    Mobility walks

## 2018-03-18 NOTE — Telephone Encounter (Signed)
Patient called reporting bloody, black tarry stools, consulted with Cira Rue NP, informed patient he needs to go to [facility] ED to be fully evaluated.  Stressed the importance as patient seemed reluctant to go.

## 2018-03-18 NOTE — ED Notes (Signed)
Failed attempt to collect urine sample. Sample was contaminated.

## 2018-03-18 NOTE — H&P (View-Only) (Signed)
[facility] Consult: 3:04 PM 03/18/2018  LOS: 0 days    Referring Provider: Dr Jeneen Rinks  Primary Care Physician:  Tammi Sou, MD Primary Gastroenterologist:  Dr. Hilarie Fredrickson.    Oncologist:   DR Burr Medico   Reason for Consultation:  Black stools, black emesis, anemia.     HPI: [name] is a 72 y.o. male.  History CAD.  Diastolic CHF.  Stage 3 CKD.  Nonalcoholic cirrhosis, splenomegaly, ascites. DM2.  COPD. Home oxygen.  Morbid obesity.  OSA. HTN.   Thrombocytopenia. Colonoscopy 2012 Screening study.  A total of 8 polyps were removed.  Dense sigmoid to descending colon diverticulosis, internal hemorrhoids.  Pathology on all the polyps was hyperplastic polyps.    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  EGD 05/04/2016 showed large, ulcerated mass at [place] without evidence for recent or active bleeding at the GE junction.  Also seen were erosions in the gastric antrum and prepylorus and mild duodenal bulb erythema.   Treated with palliative radiation and chemo.   CT 12/2016 showed no residual esophageal mass and the plan was to continue palliative chemo with carboplatin and Taxol. Latest CT chest/abdomen/pelvis 12/05/2017.  Stable exam.  No esophageal mass or mediastinal adenopathy.  Cirrhosis with ascites.  Has enteric edema.  Gallstones bilateral pleural effusions.  Three-vessel CAD.  Degenerative disc disease but no lytic or sclerotic bony changes. He is been off chemotherapy since 06/2017 and followed by Dr. Annamaria Boots, last visit was 02/10/2018.  Even the patient's lack of symptoms, i.e. no dysphagia, Dr. Annamaria Boots did not feel that patient required repeat upper endoscopy and was planning repeat CT scan in July or August 2019.  Current problems began on Saturday when he started having dark and  bloody stools.  He did not have abdominal pain.  Yesterday in the afternoon/evening he started vomiting dark material.  He denies recurrence of dark emesis today.  Today he feels dizzy/woozy.  Since the stools changed on Saturday, he has had weakness and increased dyspnea on exertion to where he cannot walk down his hallway without significant shortness of breath and tacky cardia.   He takes Protonix 40 mg p.o. twice daily.  He uses ibuprofen 600 mg may be twice a week. Family called oncology office this morning and he was advised to go to the ED. BP 104/74, pulse 115. Hgb 6.6, was 10.7 on 02/10/2018.  MCV 103.  Platelets 164. FOBT+.   AKI with BUN 47, creatinine 1.3.  With the exception of an AST of 70, and hypoalbuminemia his LFTs are normal.   Up until the weekend, patient had no GI problems.  Was not having dysphagia, heartburn, nausea, anorexia, weight fluctuation, dark or loose stools.  Past Medical History:  Diagnosis Date  . Asthma    as a child  . CAD in native artery    a. cardiac cath 09/2013 showed severely diseased mLAD and diagonal, mild LCx disease, moderate RCA disease, LVEDP 20, EF 50%.  . Cataract    multiple types, bilateral  . Cholelithiasis without  cholecystitis   . Chronic diastolic CHF (congestive heart failure) ([place]) 02/25/2009  . Chronic renal insufficiency, stage III (moderate) (HCC) 2017   Stage II/III (GFR around 60)  . Cirrhosis, nonalcoholic ([place]) 7124   with splenomegaly.  CT 11/2017 w/ascites and persistent bilat pleural effusions  . 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 COPD (chronic obstructive pulmonary disease) ([place])   . Coronary artery disease    chronically occluded LAD and diagonal with right to left collaterals, mild disease in the left circ and moderate disease in the mid RCA on medical management with Imdur, ASA, and Plavix.  . Dermatitis 05/2014  . DIABETES MELLITUS, TYPE II 06/24/2007     No diab retpthy as of 08/05/15 eye exam.  . Esophageal cancer ([place]) 04/2016   poorly differentiated carcinoma (Dr. Pyrtle--EGD).  CT C/A/P showed metastatic adenopathy in mediastinum and upper abdomen 05/09/16.  Tx plan is palliative radiation (completed 06/22/16), then palliative systemic chemotherapy (carbo+taxol) was started but as of 08/03/16 onc f/u this was held due to severe knee and ankle arthralgias.  Restarting as of 10/2015 (08/2016 CT showed some disease regression  . Esophageal cancer ([place])    CT 12/2016 showed no residual esoph mass--plan to continue chemo.  Ongoing palliative chemo with carboplatin and taxol q 2 wks as of 03/2017 onc f/u (CT 12/2016 and 04/2017 showed no progression of dz).  Taking a 2 mo break from chemo as of 05/2017.  Pleural effusion-- improved with lasix but onc wanted him to get dx/therap thoracentesis-pt declined.  CT C/A/P no progression/recurr/mets 11/2017.  Marland Kitchen Essential hypertension 05/01/2007   Qualifier: Diagnosis of  By: Tiney Rouge CMA, Ellison Hughs     . History of kidney stones   . Hyperkalemia 12/2015   Decreased ACE-I by 50% in response, then potassium normalized.  Marland Kitchen HYPERLIPIDEMIA 06/24/2007  . Hypoxemia 01/27/2010  . Morbid obesity ([place])   . Myocardial infarction [facility])    pt states he was informed per MD that he has had one but pt was unaware   . Obesity hypoventilation syndrome (HCC)    oxygen 24/7 (2 liters [place] as of 02/2016)  . On home oxygen therapy    Oxygen @ 2l/m nasally 24/7 hours  . OSA (obstructive sleep apnea)    not tested; pt scored 4 per stop bang tool results sent to PCP   . OSTEOARTHRITIS 05/01/2007  . Pancytopenia due to chemotherapy ([place]) 2018  . Pleural effusion on right 08/2017   ? malignant vs CHF: pt refuses diagnostic thoracentesis b/c he states he feels fine, wants to try diuretic first.  . Pleural effusion on right 09/2017   Thoracentesis  . Presbycusis of both ears 05/2015   [facility]  . Pruritic condition 05/2014   Allergist summer 2015,  no new testing.  Marland Kitchen RBBB   . Visual field defect 05/10/2016   Per Dr. Melina Fiddler, O.D.: Rt, loss of inf/temp quad and some loss sup/temp.  ?CVA  ? Pituitary tumor? 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    Past Surgical History:  Procedure Laterality Date  . APPENDECTOMY    . BALLOON DILATION N/A 05/04/2016   Procedure: BALLOON DILATION;  Surgeon: Jerene Bears, MD;  Location: [facility];  Service: Gastroenterology;  Laterality: N/A;  . CARDIAC CATHETERIZATION    . CARPAL TUNNEL RELEASE Left   . CATARACT EXTRACTION W/PHACO Right 04/29/2013  Procedure: CATARACT EXTRACTION PHACO AND INTRAOCULAR LENS PLACEMENT (IOC);  Surgeon: Adonis Brook, MD;  Location: [facility];  Service: Ophthalmology;  Laterality: Right;  . CATARACT EXTRACTION, BILATERAL    . COLONOSCOPY W/ POLYPECTOMY  08/2011   Many polyps--all hyperplastic, severe diverticulosis, int hem.  [employer] hemoccult testing via [facility] 06/22/15 NEG  . ESOPHAGOGASTRODUODENOSCOPY N/A 05/04/2016   Procedure: ESOPHAGOGASTRODUODENOSCOPY (EGD);  Surgeon: Jerene Bears, MD;  Location: [facility];  Service: Gastroenterology;  Laterality: N/A;  . IR GENERIC HISTORICAL  10/03/2016   IR US GUIDE VASC ACCESS RIGHT 10/03/2016 Greggory Keen, MD [facility]  . IR GENERIC HISTORICAL  10/03/2016   IR FLUORO GUIDE PORT INSERTION RIGHT 10/03/2016 Greggory Keen, MD [facility]  . KNEE ARTHROSCOPY WITH MEDIAL MENISECTOMY Right 12/16/2014   Procedure: RIGHT KNEE ARTHROSCOPY WITH MEDIAL MENISECTOMY microfracture medial femoral condyle abrasion condroplasty medial femoral condyle lateral menisectomy;  Surgeon: Tobi Bastos, MD;  Location: [facility];  Service: Orthopedics;  Laterality: Right;  . LEFT HEART CATHETERIZATION WITH CORONARY ANGIOGRAM N/A 10/26/2013   Procedure: LEFT HEART CATHETERIZATION WITH CORONARY ANGIOGRAM;  Surgeon: Jettie Booze, MD;  Location: [facility];  Service: Cardiovascular;  Laterality: N/A;  . LUMBAR [place]  SURGERY  2001  . SHOULDER SURGERY Right    right fx  . TONSILLECTOMY    . TRANSTHORACIC ECHOCARDIOGRAM  03/2016   EF 55-60%, grade I DD, LAE  . WRIST SURGERY Right    right fx    Prior to Admission medications   Medication Sig Start Date End Date Taking? Authorizing Provider  aspirin 81 MG tablet Take 81 mg by mouth daily.     Yes [provider]  atorvastatin (LIPITOR) 80 MG tablet TAKE 1 TABLET BY MOUTH ONCE DAILY 10/01/17  Yes Turner, Eber Hong, MD  baclofen (LIORESAL) 10 MG tablet 1/2 - 1 tab po q8h prn muscle spasms 07/26/17  Yes McGowen, Adrian Blackwater, MD  beta carotene w/minerals (OCUVITE) tablet Take 1 tablet by mouth daily.   Yes [provider]  cetirizine (ZYRTEC) 10 MG tablet Take 10 mg by mouth daily.   Yes [provider]  citalopram (CELEXA) 20 MG tablet TAKE 1 TABLET BY MOUTH ONCE DAILY 01/16/18  Yes McGowen, Adrian Blackwater, MD  clonazePAM (KLONOPIN) 1 MG tablet TAKE 1 TABLET BY MOUTH TWICE DAILY AS NEEDED FOR  ANXIETY 01/16/18  Yes McGowen, Adrian Blackwater, MD  cromolyn (OPTICROM) 4 % ophthalmic solution Place 1 drop into both eyes. 3 to 4 times daily 11/05/17  Yes [provider]  ezetimibe (ZETIA) 10 MG tablet Take 1 tablet (10 mg total) by mouth daily. 04/09/17 03/18/18 Yes Turner, Eber Hong, MD  furosemide (LASIX) 20 MG tablet Take 1 tablet (20 mg total) by mouth daily. 11/15/17  Yes McGowen, Adrian Blackwater, MD  gabapentin (NEURONTIN) 300 MG capsule TAKE ONE CAPSULE BY MOUTH THREE TIMES DAILY 11/18/17  Yes McGowen, Adrian Blackwater, MD  HUMULIN N KWIKPEN 100 UNIT/ML Kiwkpen INJECT 16 UNITS EVERY MORNING AND 16 UNITS EVERY EVENING 11/06/17  Yes McGowen, Adrian Blackwater, MD  hydrOXYzine (ATARAX/VISTARIL) 25 MG tablet TAKE 1 TABLET BY MOUTH AT SUPPER AND 1 TAB AT BEDTIME FOR INSOMNIA 05/31/17  Yes McGowen, Adrian Blackwater, MD  Insulin Lispro (HUMALOG KWIKPEN) 200 UNIT/ML SOPN Inject 20 Units into the skin 2 (two) times daily with a meal. Patient taking differently: Inject 16 Units into the skin 2  (two) times daily with a meal. Takes  16 units  Twice  Daily. 04/25/17  Yes McGowen, Adrian Blackwater, MD  magnesium chloride (SLOW-MAG) 64 MG TBEC SR tablet Take 1 tablet (64 mg total) by mouth daily. 09/01/16  Yes Eber Jones, MD  metFORMIN (GLUCOPHAGE) 1000 MG tablet TAKE 1 TABLET BY MOUTH TWICE DAILY WITH MEALS 02/25/18  Yes McGowen, Adrian Blackwater, MD  Omega-3 Fatty Acids ([employer] FISH OIL) 1000 MG CAPS Take 2 tablets by mouth 2 (two) times daily. 10/18/14  Yes Turner, Eber Hong, MD  OXYGEN Inhale 2 L/min into the lungs continuous.   Yes [provider]  pantoprazole (PROTONIX) 40 MG tablet Take 1 tablet (40 mg total) by mouth 2 (two) times daily. 02/24/18  Yes McGowen, Adrian Blackwater, MD  clotrimazole-betamethasone (LOTRISONE) cream Apply to affected area twice daily Patient not taking: Reported on 03/18/2018 06/19/17   Tammi Sou, MD  Insulin NPH, Human,, Isophane, (HUMULIN N) 100 UNIT/ML Kiwkpen 16 units every morning and 16 units every evening Patient not taking: Reported on 03/18/2018 12/31/17   Tammi Sou, MD  ONE TOUCH ULTRA TEST test strip USE  STRIP TO CHECK GLUCOSE THREE TIMES DAILY AS  DIRECTED 04/05/17   McGowen, Adrian Blackwater, MD  [facility] DELICA LANCETS 92E MISC USE ONE  TO CHECK GLUCOSE THREE TIMES DAILY 02/26/17   McGowen, Adrian Blackwater, MD  oxyCODONE (OXY IR/ROXICODONE) 5 MG immediate release tablet Take 1 tablet (5 mg total) by mouth every 8 (eight) hours as needed for severe pain. Patient not taking: Reported on 03/18/2018 02/06/17   Truitt Merle, MD    Scheduled Meds:  Infusions: . sodium chloride    . famotidine (PEPCID) IV     PRN Meds:    Allergies as of 03/18/2018  . (No Known Allergies)    Family History  Problem Relation Age of Onset  . Heart attack Mother   . Aneurysm Father   . Alcohol abuse Father   . Diabetes Maternal Aunt   . Colon cancer Neg Hx     Social History   Socioeconomic History  . Marital status: Married    Spouse name: susan  . Number of children:  2  . Years of education: Not on file  . Highest education level: Not on file  Occupational History  . Occupation: Retired  Scientific laboratory technician  . Financial resource strain: Not on file  . Food insecurity:    Worry: Not on file    Inability: Not on file  . Transportation needs:    Medical: Not on file    Non-medical: Not on file  Tobacco Use  . Smoking status: Former Smoker    Packs/day: 1.50    Years: 30.00    Pack years: 45.00    Types: Cigarettes, Pipe, Cigars    Last attempt to quit: 10/30/1983    Years since quitting: 34.4  . Smokeless tobacco: Current User    Types: Chew    Last attempt to quit: 05/15/2016  Substance and Sexual Activity  . Alcohol use: No  . Drug use: No  . Sexual activity: Not Currently  Lifestyle  . Physical activity:    Days per week: Not on file    Minutes per session: Not on file  . Stress: Not on file  Relationships  . Social connections:    Talks on phone: Not on file    Gets together: Not on file    Attends religious service: Not on file    Active member of club or organization: Not on file    Attends meetings of clubs  or organizations: Not on file    Relationship status: Not on file  . Intimate partner violence:    Fear of current or ex partner: Not on file    Emotionally abused: Not on file    Physically abused: Not on file    Forced sexual activity: Not on file  Other Topics Concern  . Not on file  Social History Narrative   Married, one son and one daughter.   His daughter and her two children live with him.   Coffee daily.  Former smoker.  No alcohol.   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   Attends church weekly-   Oxygen continuous    REVIEW OF SYSTEMS: Constitutional: Sedentary lifestyle, the bulk of his days is sitting in the chair watching TV.  Leaves home for shopping a few times a week.  Requires cane/walker.  Becomes short of breath if he walks more than a few 100 feet. ENT:  No nose bleeds.   Diminished hearing Pulm: Worsening dyspnea on exertion as per HPI CV:    Palpitations with activity as per HPI.  Stable LE edema.  No chest pain GU:  No hematuria, no frequency GI:  Per HPI Heme: Other than the gastrointestinal bleeding lately, he has not had any unusual or excessive bleeding or bruising.  He is never been prescribed iron or been treated with B12 orally or by injection. Transfusions: He recalls no previous blood transfusions. Neuro: Some orthostatic dizziness today.  Normally has no headaches, no peripheral tingling or numbness Derm:  No itching, no rash or sores.  Endocrine:  No sweats or chills.  No polyuria or dysuria Immunization: Reviewed.  He is up-to-date on multiple vaccinations including his flu shot. Travel:  None beyond local counties in last few months.    PHYSICAL EXAM: Vital signs in last 24 hours: Vitals:   03/18/18 1208 03/18/18 1300  BP:  120/72  Pulse:  (!) 103  Resp:  18  Temp:    SpO2: 94% 94%   Wt Readings from Last 3 Encounters:  03/18/18 293 lb (132.9 kg)  02/10/18 293 lb 12.8 oz (133.3 kg)  01/22/18 291 lb 6 oz (132.2 kg)    General: Obese, unwell, pale looking WM who is alert.  He is hard of hearing. Head: No facial asymmetry or swelling.  No signs of head trauma. Eyes: Conjunctiva pale.  No scleral icterus.  EOMI. Ears: Hard of hearing Nose: No discharge or congestion. Mouth: Oral mucosa moist and clear.  Tongue midline.  Edentulous. Neck: No JVD, no thyromegaly or masses. Lungs: Greatly reduced breath sounds globally.  No adventitious sounds.  No cough or dyspnea. Heart: RR with tachycardia.  No MRG. Abdomen: Obese.  Soft.  Nontender.  Active bowel sounds..   Rectal: Deferred rectal exam. Musc/Skeltl: No joint redness or swelling. Extremities: Slight pedal edema a little bit more prominent on the right. Neurologic: Alert.  Oriented x3.  No tremor, no asterixis.  Moves all 4 limbs, strength not tested.  Hard of hearing. Skin: No  rashes, sores or suspicious lesions. Nodes: No cervical adenopathy. Psych: Calm, cooperative.  Intake/Output from previous day: No intake/output data recorded. Intake/Output this shift: No intake/output data recorded.  LAB RESULTS: Recent Labs    03/18/18 1315  WBC 11.3*  HGB 6.6*  HCT 21.1*  PLT 164   BMET Lab Results  Component Value Date   NA 136 03/18/2018   NA 138 02/10/2018   NA  137 01/22/2018   K 4.6 03/18/2018   K 4.0 02/10/2018   K 4.3 01/22/2018   CL 92 (L) 03/18/2018   CL 97 (L) 02/10/2018   CL 96 01/22/2018   CO2 28 03/18/2018   CO2 31 (H) 02/10/2018   CO2 33 (H) 01/22/2018   GLUCOSE 170 (H) 03/18/2018   GLUCOSE 125 02/10/2018   GLUCOSE 134 (H) 01/22/2018   BUN 47 (H) 03/18/2018   BUN 13 02/10/2018   BUN 18 01/22/2018   CREATININE 1.32 (H) 03/18/2018   CREATININE 1.19 02/10/2018   CREATININE 1.14 01/22/2018   CALCIUM 8.7 (L) 03/18/2018   CALCIUM 9.0 02/10/2018   CALCIUM 9.2 01/22/2018   LFT Recent Labs    03/18/18 1315  PROT 6.6  ALBUMIN 3.0*  AST 70*  ALT 48  ALKPHOS 53  BILITOT 1.1   PT/INR Lab Results  Component Value Date   INR 1.01 10/03/2016   INR 0.95 08/28/2016   INR 1.0 10/20/2013   Hepatitis Panel No results for input(s): HEPBSAG, HCVAB, HEPAIGM, HEPBIGM in the last 72 hours. C-Diff No components found for: CDIFF Lipase     Component Value Date/Time   LIPASE 22 03/18/2018 1315    Drugs of Abuse  No results found for: LABOPIA, COCAINSCRNUR, LABBENZ, AMPHETMU, THCU, LABBARB   RADIOLOGY STUDIES: No results found.    IMPRESSION:   *   Upper GI bleed.  Rule out esophagitis, recurrent esophageal tumor with ulceration, variceal source, portal hypertensive gastropathy.  *   Blood loss anemia.  Blood transfusions ordered.  *    History esophageal squamous cell carcinoma diagnosed 04/2016.  Has completed palliative radiation and chemotherapy.  CT scan last month did not show any recurrence of adenopathy or esophageal  mass.  *    COPD, OSA.  On home oxygen.    PLAN:     *   Orders placed for upper endoscopy tomorrow.  Tonight he can have ice chips. I am going to request twice daily IV Protonix rather than the Pepcid which is currently ordered.     Azucena Freed  03/18/2018, 3:04 PM Phone [phone]   Attending physician's note   I have taken an interval history, reviewed the chart and examined the patient. I agree with the Advanced Practitioner's note, impression and recommendations.   72 year old with history of CAD, CHF, CKD, DM 2, COPD on home O2, morbid obesity, OSA, HTN, nonalcoholic liver cirrhosis with ascites and thrombocytopenia.  History of poorly differentiated squamous cell esophageal cancer 04/2016, treated with palliative chemo/XRT, CT chest/abdo/pel 12/2016 - neg for residual mass, off chemo since 06/2017, without dysphagia, admitted with hematemesis and melena with NSAID use.  Adm Hb 6.6. plt 164K   Plan: Transfuse hemoglobin of 8-9, IV Protonix, EGD in a.m..  Hold off on IV octreotide since there was no previous esophageal varices, IV antibiotics, avoid nonsteroidals, Trend CBC, check PT, discussed with patient and patient's family.  Carmell Austria, MD

## 2018-03-18 NOTE — ED Triage Notes (Signed)
Pt reports black stools starting this weekend, emesis starting yesterday.  Pt called cancer doctor and instructed pt to come to the ED for eval. Pt also increasingly short of breath today more than normal, esp with exertion. Pt on home O2 2L/min [place].

## 2018-03-19 ENCOUNTER — Inpatient Hospital Stay (HOSPITAL_COMMUNITY): Payer: Medicare Other | Admitting: Certified Registered Nurse Anesthetist

## 2018-03-19 ENCOUNTER — Encounter (HOSPITAL_COMMUNITY): Payer: Self-pay

## 2018-03-19 ENCOUNTER — Encounter (HOSPITAL_COMMUNITY)
Admission: EM | Disposition: A | Discharge status: Home / Self Care | Payer: Self-pay | Source: Home / Self Care | Attending: Family Medicine

## 2018-03-19 DIAGNOSIS — J438 Other emphysema: Secondary | ICD-10-CM

## 2018-03-19 DIAGNOSIS — K2970 Gastritis, unspecified, without bleeding: Secondary | ICD-10-CM

## 2018-03-19 DIAGNOSIS — I10 Essential (primary) hypertension: Secondary | ICD-10-CM

## 2018-03-19 DIAGNOSIS — E785 Hyperlipidemia, unspecified: Secondary | ICD-10-CM

## 2018-03-19 DIAGNOSIS — K3189 Other diseases of stomach and duodenum: Secondary | ICD-10-CM

## 2018-03-19 DIAGNOSIS — K2210 Ulcer of esophagus without bleeding: Secondary | ICD-10-CM

## 2018-03-19 DIAGNOSIS — K920 Hematemesis: Secondary | ICD-10-CM

## 2018-03-19 HISTORY — PX: BIOPSY: SHX5522

## 2018-03-19 HISTORY — PX: ESOPHAGOGASTRODUODENOSCOPY (EGD) WITH PROPOFOL: SHX5813

## 2018-03-19 LAB — BASIC METABOLIC PANEL WITH GFR
BUN: 52 mg/dL — ABNORMAL HIGH (ref 6–20)
CO2: 32 mmol/L (ref 22–32)
Calcium: 8.3 mg/dL — ABNORMAL LOW (ref 8.9–10.3)
GFR calc Af Amer: >60 mL/min (ref >60)
Sodium: 137 mmol/L (ref 135–145)

## 2018-03-19 LAB — BASIC METABOLIC PANEL
Anion gap: 10 (ref 5–15)
Chloride: 95 mmol/L — ABNORMAL LOW (ref 101–111)
Creatinine, Ser: 1.08 mg/dL (ref 0.61–1.24)
GFR calc non Af Amer: >60 mL/min (ref >60)
Glucose, Bld: 157 mg/dL — ABNORMAL HIGH (ref 65–99)
Potassium: 4.1 mmol/L (ref 3.5–5.1)

## 2018-03-19 LAB — CBC
HCT: 22.7 % — ABNORMAL LOW (ref 39.0–52.0)
Hemoglobin: 7.3 g/dL — ABNORMAL LOW (ref 13.0–17.0)
MCH: 30.9 pg (ref 26.0–34.0)
MCHC: 32.2 g/dL (ref 30.0–36.0)
MCV: 96.2 fL (ref 78.0–100.0)
Platelets: 100 10*3/uL — ABNORMAL LOW (ref 150–400)
RBC: 2.36 MIL/uL — ABNORMAL LOW (ref 4.22–5.81)
RDW: 18.9 % — ABNORMAL HIGH (ref 11.5–15.5)
WBC: 7 10*3/uL (ref 4.0–10.5)

## 2018-03-19 LAB — GLUCOSE, CAPILLARY
Glucose-Capillary: 144 mg/dL — ABNORMAL HIGH (ref 65–99)
Glucose-Capillary: 150 mg/dL — ABNORMAL HIGH (ref 65–99)
Glucose-Capillary: 209 mg/dL — ABNORMAL HIGH (ref 65–99)
Glucose-Capillary: 175 mg/dL — ABNORMAL HIGH (ref 65–99)

## 2018-03-19 LAB — PROTIME-INR
INR: 1.26
Prothrombin Time: 15.7 seconds — ABNORMAL HIGH (ref 11.4–15.2)

## 2018-03-19 SURGERY — ESOPHAGOGASTRODUODENOSCOPY (EGD) WITH PROPOFOL
Anesthesia: Monitor Anesthesia Care

## 2018-03-19 MED ORDER — SUCRALFATE 1 GM/10ML PO SUSP
1.0000 g | Freq: Two times a day (BID) | ORAL | Status: DC
  Administered 2018-03-19 – 2018-03-22 (×7): 1 g via ORAL
  Filled 2018-03-19 (×7): qty 10

## 2018-03-19 MED ORDER — SUCRALFATE 1 GM/10ML PO SUSP: 1.0000 g | Freq: Three times a day (TID) | ORAL | Status: DC

## 2018-03-19 MED ORDER — PANTOPRAZOLE SODIUM 40 MG PO TBEC
40.0000 mg | DELAYED_RELEASE_TABLET | Freq: Two times a day (BID) | ORAL | Status: DC
  Administered 2018-03-19 – 2018-03-22 (×6): 40 mg via ORAL
  Filled 2018-03-19 (×6): qty 1

## 2018-03-19 MED ORDER — PROPOFOL 10 MG/ML IV BOLUS
INTRAVENOUS | Status: DC | PRN
  Administered 2018-03-19: 20 mg via INTRAVENOUS

## 2018-03-19 MED ORDER — PHENYLEPHRINE 40 MCG/ML (10ML) SYRINGE FOR IV PUSH (FOR BLOOD PRESSURE SUPPORT)
PREFILLED_SYRINGE | INTRAVENOUS | Status: DC | PRN
  Administered 2018-03-19: 80 ug via INTRAVENOUS

## 2018-03-19 MED ORDER — PROPOFOL 500 MG/50ML IV EMUL
INTRAVENOUS | Status: DC | PRN
  Administered 2018-03-19: 100 ug/kg/min via INTRAVENOUS

## 2018-03-19 SURGICAL SUPPLY — 15 items

## 2018-03-19 NOTE — Transfer of Care (Signed)
Immediate Anesthesia Transfer of Care Note  Patient: [name]  Procedure(s) Performed: ESOPHAGOGASTRODUODENOSCOPY (EGD) WITH PROPOFOL (N/A ) BIOPSY  Patient Location: PACU and Endoscopy Unit  Anesthesia Type:mac  Level of Consciousness: drowsy  Airway & Oxygen Therapy: Patient Spontanous Breathing and Patient connected to nasal cannula oxygen  Post-op Assessment: Report given to RN and Post -op Vital signs reviewed and stable  Post vital signs: Reviewed and stable  Last Vitals:  Vitals Value Taken Time  BP 97/63 03/19/2018 10:24 AM  Temp    Pulse 78 03/19/2018 10:26 AM  Resp 13 03/19/2018 10:26 AM  SpO2 98 % 03/19/2018 10:26 AM  Vitals shown include unvalidated device data.  Last Pain:  Vitals:   03/19/18 0908  TempSrc: Oral  PainSc: 0-No pain         Complications: No apparent anesthesia complications

## 2018-03-19 NOTE — Anesthesia Postprocedure Evaluation (Signed)
Anesthesia Post Note  Patient: [name]  Procedure(s) Performed: ESOPHAGOGASTRODUODENOSCOPY (EGD) WITH PROPOFOL (N/A ) BIOPSY     Patient location during evaluation: PACU Anesthesia Type: MAC Level of consciousness: awake and alert Pain management: pain level controlled Vital Signs Assessment: post-procedure vital signs reviewed and stable Respiratory status: spontaneous breathing Cardiovascular status: stable Anesthetic complications: no    Last Vitals:  Vitals:   03/19/18 1042 03/19/18 1108  BP: 128/64 120/74  Pulse: 83 83  Resp: 16 16  Temp:  36.9 C  SpO2: 96% 100%    Last Pain:  Vitals:   03/19/18 1108  TempSrc: Oral  PainSc:                  Nolon Nations

## 2018-03-19 NOTE — Anesthesia Preprocedure Evaluation (Signed)
Anesthesia Evaluation  Patient identified by MRN, date of birth, ID band Patient awake    Reviewed: Allergy & Precautions, NPO status , Patient's Chart, lab work & pertinent test results  Airway Mallampati: III  TM Distance: >3 FB Neck ROM: Full    Dental  (+) Upper Dentures, Lower Dentures   Pulmonary asthma , sleep apnea and Oxygen sleep apnea , COPD,  COPD inhaler and oxygen dependent, former smoker,     + decreased breath sounds+ wheezing      Cardiovascular hypertension, Pt. on medications + angina + CAD, + Past MI, + Peripheral Vascular Disease, +CHF, + PND and + DOE   Rhythm:Regular Rate:Normal  Echo 2017 - Left ventricle: The cavity size was normal. Wall thickness was normal. Systolic function was normal. The estimated ejection fraction was in the range of 55% to 60%. Although no diagnostic regional wall motion abnormality was identified, this possibility cannot be completely excluded on the basis of this study. Doppler parameters are consistent with abnormal left ventricular relaxation (grade 1 diastolic dysfunction). - Mitral valve: Calcified annulus. - Left atrium: The atrium was mildly dilated.   Neuro/Psych PSYCHIATRIC DISORDERS negative neurological ROS     GI/Hepatic negative GI ROS, Neg liver ROS,   Endo/Other  diabetes, Type 2, Oral Hypoglycemic Agents  Renal/GU Renal disease     Musculoskeletal  (+) Arthritis ,   Abdominal   Peds  Hematology negative hematology ROS (+)   Anesthesia Other Findings   Reproductive/Obstetrics negative OB ROS                             Lab Results  Component Value Date   WBC 11.3 (H) 03/18/2018   HGB 7.0 (L) 03/18/2018   HCT 22.1 (L) 03/18/2018   MCV 103.4 (H) 03/18/2018   PLT 164 03/18/2018   Lab Results  Component Value Date   CREATININE 1.08 03/19/2018   BUN 52 (H) 03/19/2018   NA 137 03/19/2018   K 4.1 03/19/2018   CL 95 (L)  03/19/2018   CO2 32 03/19/2018   Lab Results  Component Value Date   INR 1.26 03/19/2018   INR 1.01 10/03/2016   INR 0.95 08/28/2016   02/2016 EKG: normal sinus rhythm, RBBB.  03/2016 Echo - Left ventricle: The cavity size was normal. Wall thickness was normal. Systolic function was normal. The estimated ejection fraction was in the range of 55% to 60%. Although no diagnostic regional wall motion abnormality was identified, this possibility cannot be completely excluded on the basis of this study. Doppler parameters are consistent with abnormal left ventricular relaxation (grade 1 diastolic dysfunction). - Mitral valve: Calcified annulus. - Left atrium: The atrium was mildly dilated.    Anesthesia Physical  Anesthesia Plan  ASA: III  Anesthesia Plan: MAC   Post-op Pain Management:    Induction: Intravenous  PONV Risk Score and Plan: 1 and Propofol infusion and Ondansetron  Airway Management Planned: Natural Airway and Nasal Cannula  Additional Equipment:   Intra-op Plan:   Post-operative Plan:   Informed Consent: I have reviewed the patients History and Physical, chart, labs and discussed the procedure including the risks, benefits and alternatives for the proposed anesthesia with the patient or authorized representative who has indicated his/her understanding and acceptance.   Dental advisory given  Plan Discussed with: CRNA  Anesthesia Plan Comments:         Anesthesia Quick Evaluation

## 2018-03-19 NOTE — Interval H&P Note (Signed)
History and Physical Interval Note:  03/19/2018 10:00 AM  [name]  has presented today for surgery, with the diagnosis of Dark emesis, dark stools, FOBT positive, anemia.  History esophageal cancer  The various methods of treatment have been discussed with the patient and family. After consideration of risks, benefits and other options for treatment, the patient has consented to  Procedure(s): ESOPHAGOGASTRODUODENOSCOPY (EGD) WITH PROPOFOL (N/A) as a surgical intervention .  The patient's history has been reviewed, patient examined, no change in status, stable for surgery.  I have reviewed the patient's chart and labs.  Questions were answered to the patient's satisfaction.     Jackquline Denmark

## 2018-03-19 NOTE — Progress Notes (Signed)
PROGRESS NOTE    [name]  DGL:[id number] DOB: 1945-12-04 DOA: 03/18/2018 PCP: Tammi Sou, MD    Brief Narrative:  72 year old male who presented with nausea, vomiting and melena.  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  Reported 3 days of dark stools, 24 hours of nausea and vomiting.  Initial physical examination blood pressure 104/74, heart rate 115, respirate 18, temperature 98.3, saturation 92%.  Moist mucous membranes, lungs with increased work of breathing, diminished breath sounds at bases, no wheezing, heart S1-2 present, distant, no gallops, rubs or murmurs, abdomen was soft nontender, no lower extremity edema.  Sodium 136, potassium 4.6, chloride 92, bicarbonate 28, glucose 170, BUN 47, creatinine 1.32, white count 11.3, hemoglobin 6.6, hematocrit 21.1, platelets 164.  Urinalysis negative for infection  Patient was admitted to the hospital working diagnosis acute blood loss anemia due to upper GI bleed.  Assessment & Plan:   Principal Problem:   GIB (gastrointestinal bleeding) Active Problems:   Diabetes mellitus without complication (HCC)   Dyslipidemia   Morbid obesity (HCC)   Essential hypertension   Chronic diastolic CHF (congestive heart failure) (HCC)   Hypoxemia   Coronary atherosclerosis of native coronary artery   OSA (obstructive sleep apnea)   COPD (chronic obstructive pulmonary disease) (HCC)   PND (paroxysmal nocturnal dyspnea)   Cancer of cardio-esophageal junction ([place])   Port catheter in place   1.  Acute blood loss anemia due to upper GI bleed. Hb and Hct stable, no further drop in cell counts, will continue close monitoring. EGD with non bleeding esophageal ulcer, diet has been advanced, continue  antiacid therapy. Will continue pantoprazole 40 mg po bid and sucralfate.   2.  Acute kidney injury. Improved renal function with serum cr down to 1,0 with K at 4,1 and serum bicarbonate at 32. Off IV fluids, follow on renal panel in am. Avoid hypotension or nephrotoxic medications.    3.  Type 2 diabetes mellitus. Advance diet after egd, will continue glucose cover and monitoring with insulin sliding scale. Basal insulin regime with nph 10 units bid. Capillary glucose 131, 152, 144, 150, 209.   4.  Dyslipidemia. Continue statin therapy   5. Dyslipidemia. Continue atorvastatin and zetia  6. Depression. No confusion or agitation, continue citalopram, clonazepam  DVT prophylaxis: scd    Code Status: dnr Family Communication: I spoke with patient's family at the bedside and all questions were addressed.  Disposition Plan: possible dc in am if hb and hct stable    Consultants:   GI   Procedures:     Antimicrobials:       Subjective: Patient is feeling better, no nausea or vomiting, no chest pain or dyspnea, no further bleeding.   Objective: Vitals:   03/19/18 1033 03/19/18 1042 03/19/18 1108 03/19/18 1440  BP: (!) 93/54 128/64 120/74 (!) 96/55  Pulse:  83 83 89  Resp: 13 16 16 17   Temp: 98.4 F (36.9 C)  98.4 F (36.9 C) 98.1 F (36.7 C)  TempSrc: Oral  Oral Oral  SpO2: 98% 96% 100% 98%  Weight:      Height:        Intake/Output Summary (Last 24 hours) at 03/19/2018 1518 Last data filed at 03/19/2018 1019 Gross per 24 hour  Intake 554.5 ml  Output 1100 ml  Net -  545.5 ml   Filed Weights   03/18/18 1209 03/18/18 2013 03/19/18 0908  Weight: 132.9 kg (293 lb) 123.8 kg (272 lb 14.9 oz) 123.4 kg (272 lb)    Examination:   General: Not in pain or dyspnea, deconditioned  Neurology: Awake and alert, non focal  E ENT: no pallor, no icterus, oral mucosa moist Cardiovascular: No JVD. S1-S2 present, rhythmic, no gallops, rubs, or murmurs. No lower extremity  edema. Pulmonary: vesicular breath sounds bilaterally, adequate air movement, no wheezing, rhonchi or rales. Gastrointestinal. Abdomen protuberant no organomegaly, non tender, no rebound or guarding Skin. No rashes Musculoskeletal: no joint deformities     Data Reviewed: I have personally reviewed following labs and imaging studies  CBC: Recent Labs  Lab 03/18/18 1315 03/18/18 2043 03/19/18 0700  WBC 11.3*  --  7.0  HGB 6.6* 7.0* 7.3*  HCT 21.1* 22.1* 22.7*  MCV 103.4*  --  96.2  PLT 164  --  124*   Basic Metabolic Panel: Recent Labs  Lab 03/18/18 1315 03/19/18 0700  NA 136 137  K 4.6 4.1  CL 92* 95*  CO2 28 32  GLUCOSE 170* 157*  BUN 47* 52*  CREATININE 1.32* 1.08  CALCIUM 8.7* 8.3*   GFR: Estimated Creatinine Clearance: 80.3 mL/min (by C-G formula based on SCr of 1.08 mg/dL). Liver Function Tests: Recent Labs  Lab 03/18/18 1315  AST 70*  ALT 48  ALKPHOS 53  BILITOT 1.1  PROT 6.6  ALBUMIN 3.0*   Recent Labs  Lab 03/18/18 1315  LIPASE 22   No results for input(s): AMMONIA in the last 168 hours. Coagulation Profile: Recent Labs  Lab 03/19/18 0700  INR 1.26   Cardiac Enzymes: No results for input(s): CKTOTAL, CKMB, CKMBINDEX, TROPONINI in the last 168 hours. BNP (last 3 results) No results for input(s): PROBNP in the last 8760 hours. HbA1C: No results for input(s): HGBA1C in the last 72 hours. CBG: Recent Labs  Lab 03/18/18 2107 03/19/18 0800 03/19/18 1110  GLUCAP 152* 144* 150*   Lipid Profile: No results for input(s): CHOL, HDL, LDLCALC, TRIG, CHOLHDL, LDLDIRECT in the last 72 hours. Thyroid Function Tests: No results for input(s): TSH, T4TOTAL, FREET4, T3FREE, THYROIDAB in the last 72 hours. Anemia Panel: No results for input(s): VITAMINB12, FOLATE, FERRITIN, TIBC, IRON, RETICCTPCT in the last 72 hours.    Radiology Studies: I have reviewed all of the imaging during this hospital visit personally     Scheduled Meds: .  atorvastatin  80 mg Oral Daily  . citalopram  20 mg Oral Daily  . ezetimibe  10 mg Oral Daily  . gabapentin  300 mg Oral TID  . insulin aspart  0-9 Units Subcutaneous TID WC  . insulin NPH Human  10 Units Subcutaneous BID AC & HS  . loratadine  10 mg Oral Daily  . omega-3 acid ethyl esters  1 g Oral BID  . pantoprazole (PROTONIX) IV  40 mg Intravenous Q12H  . sodium chloride flush  3 mL Intravenous Q12H  . sucralfate  1 g Oral BID   Continuous Infusions: . sodium chloride       LOS: 1 day        Tawni Millers, MD [facility] Pager [phone]

## 2018-03-19 NOTE — Op Note (Signed)
[facility] Patient Name: [name] Procedure Date: 03/19/2018 MRN: [id number] Attending MD: Jackquline Denmark , MD Date of Birth: August 25, 1946 CSN: [id number] Age: 72 Admit Type: Inpatient Procedure:                Upper GI endoscopy Indications:              72 year old with history of CAD, CHF, CKD, DM 2,                            COPD on home O2, morbid obesity, OSA, HTN,                            nonalcoholic liver cirrhosis with ascites and                            thrombocytopenia. 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 Adm Hb 6.6. plt 164K Providers:                Jackquline Denmark, MD, Elmer Ramp. Tilden Dome, RN, Laurena Spies, Technician Referring MD:              Medicines:                Monitored Anesthesia Care Complications:            No immediate complications. Estimated Blood Loss:     Estimated blood loss was minimal. Procedure:                Pre-Anesthesia Assessment:                           - Prior to the procedure, a History and Physical                            was performed, and patient medications and                            allergies were reviewed. The patient is competent.  The risks and benefits of the procedure and the                            sedation options and risks were discussed with the                            patient. All questions were answered and informed                            consent was obtained. Patient identification and                            proposed procedure were verified by the physician        in the pre-procedure area in the procedure room in                            the endoscopy suite. Mental Status Examination:                            alert and oriented. Prophylactic Antibiotics: The                            patient does not require prophylactic antibiotics.                            Prior Anticoagulants: The patient has taken no                            previous anticoagulant or antiplatelet agents. ASA                            Grade Assessment: IV - A patient with severe                            systemic disease that is a constant threat to life.                            After reviewing the risks and benefits, the patient                            was deemed in satisfactory condition to undergo the                            procedure. The anesthesia plan was to use monitored                            anesthesia care (MAC). Immediately prior to                            administration of medications, the patient was                            re-assessed for adequacy to  receive sedatives. The                            heart rate, respiratory rate, oxygen saturations,                            blood pressure, adequacy of pulmonary ventilation,                            and response to care were monitored throughout the                            procedure. The physical status of the patient was                            re-assessed after the procedure.                           After obtaining informed consent, the endoscope was                            passed under direct vision. Throughout the                            procedure, the patient's blood pressure, pulse, and                            oxygen saturations were monitored continuously. The                            Endoscope was introduced through the mouth, and                            advanced to the second part of duodenum. The upper                            GI endoscopy was  accomplished without difficulty.                            The patient tolerated the procedure well. Scope In: Scope Out: Findings:      Distal esophageal ulcer with no bleeding and no stigmata of recent       bleeding was found 37 to 40 cm from the incisors. Small 4 mm nodule       distal esophagus at the site of previous squamous cell carcinoma       (correlated with the previous EGD). Multiple biopsies were taken with a       cold forceps for histology. Retroflexed examination of the cardia didn't       reveal any obvious masses. No obvious varices.      A few localized, 6 mm non-bleeding erosions were found in the gastric       antrum. There were no stigmata of recent bleeding. Biopsies were taken       with a cold forceps for Helicobacter pylori testing. Estimated blood       loss  was minimal.      The examined duodenum was normal. Impression:               - Distal esophageal ulcer. Biopsied (could                            represent radiation-induced ulcers, rule out                            recurrence)                           - Non-bleeding erosive gastropathy. Biopsied.                           - Normal examined duodenum. Moderate Sedation:      N/A- Per Anesthesia Care Recommendation:           - Return patient to hospital ward for ongoing care.                           - Soft diet today.                           - Await pathology results.                           - No aspirin, ibuprofen, naproxen, or other                            non-steroidal anti-inflammatory drugs.                           - Use Protonix (pantoprazole) 40 mg PO BID x 12                            weeks, then once a day.                           - Use sucralfate suspension 1 gram PO BID for 4                            weeks.                           - Monitor CBC Procedure Code(s):        --- Professional ---                           [phone], Esophagogastroduodenoscopy, flexible,                             transoral; with biopsy, single or multiple Diagnosis Code(s):        --- Professional ---                           K22.10, Ulcer of esophagus without bleeding  K31.89, Other diseases of stomach and duodenum                           K92.2, Gastrointestinal hemorrhage, unspecified CPT copyright 2017 [employer]. All rights reserved. The codes documented in this report are preliminary and upon coder review may  be revised to meet current compliance requirements. Jackquline Denmark, MD 03/19/2018 10:35:32 AM This report has been signed electronically. Number of Addenda: 0

## 2018-03-20 ENCOUNTER — Encounter (HOSPITAL_COMMUNITY): Payer: Self-pay | Admitting: Gastroenterology

## 2018-03-20 DIAGNOSIS — K254 Chronic or unspecified gastric ulcer with hemorrhage: Secondary | ICD-10-CM

## 2018-03-20 LAB — CBC
HCT: 24.3 % — ABNORMAL LOW (ref 39.0–52.0)
HEMOGLOBIN: 7.7 g/dL — AB (ref 13.0–17.0)
MCH: 30.4 pg (ref 26.0–34.0)
MCHC: 31.7 g/dL (ref 30.0–36.0)
MCV: 96 fL (ref 78.0–100.0)
Platelets: 93 10*3/uL — ABNORMAL LOW (ref 150–400)
RBC: 2.53 MIL/uL — AB (ref 4.22–5.81)
RDW: 20.5 % — ABNORMAL HIGH (ref 11.5–15.5)
WBC: 4.9 10*3/uL (ref 4.0–10.5)

## 2018-03-20 LAB — GLUCOSE, CAPILLARY
Glucose-Capillary: 149 mg/dL — ABNORMAL HIGH (ref 65–99)
Glucose-Capillary: 217 mg/dL — ABNORMAL HIGH (ref 65–99)
Glucose-Capillary: 208 mg/dL — ABNORMAL HIGH (ref 65–99)
Glucose-Capillary: 209 mg/dL — ABNORMAL HIGH (ref 65–99)

## 2018-03-20 LAB — PREPARE RBC (CROSSMATCH)

## 2018-03-20 MED ORDER — SODIUM CHLORIDE 0.9 % IV SOLN
Freq: Once | INTRAVENOUS | Status: AC
  Administered 2018-03-20: 15:00:00 via INTRAVENOUS

## 2018-03-20 NOTE — Progress Notes (Addendum)
     [facility] Progress Note  CC:  Esophageal ulcer  Subjective:  No further sign of GI bleeding, no BM yet.  Tolerating diet.  No labs checked this AM.   Objective:  Vital signs in last 24 hours: Temp:  [98.1 F (36.7 C)-99.1 F (37.3 C)] 98.4 F (36.9 C) (05/23 0509) Pulse Rate:  [84-89] 84 (05/23 0509) Resp:  [16-18] 18 (05/23 0509) BP: (96-145)/(55-80) 145/80 (05/23 0509) SpO2:  [97 %-100 %] 100 % (05/23 0509) Last BM Date: 03/19/18 General:  Alert, Well-developed, in NAD.  Looks very pale. Heart:  Regular rate and rhythm; no murmurs Pulm:  CTAB.  No increased WOB. Abdomen:  Soft, non-distended.  BS present.  Non-tender. Extremities:  Slight pedal edema. Neurologic:  Alert and oriented x 4;  grossly normal neurologically. Psych:  Alert and cooperative. Normal mood and affect.  Intake/Output from previous day: 05/22 0701 - 05/23 0700 In: 220 [P.O.:120; I.V.:100] Out: 200 [Urine:200] Intake/Output this shift: Total I/O In: -  Out: 250 [Urine:250]  Lab Results: Recent Labs    03/18/18 1315 03/18/18 2043 03/19/18 0700  WBC 11.3*  --  7.0  HGB 6.6* 7.0* 7.3*  HCT 21.1* 22.1* 22.7*  PLT 164  --  100*   BMET Recent Labs    03/18/18 1315 03/19/18 0700  NA 136 137  K 4.6 4.1  CL 92* 95*  CO2 28 32  GLUCOSE 170* 157*  BUN 47* 52*  CREATININE 1.32* 1.08  CALCIUM 8.7* 8.3*   LFT Recent Labs    03/18/18 1315  PROT 6.6  ALBUMIN 3.0*  AST 70*  ALT 48  ALKPHOS 53  BILITOT 1.1   PT/INR Recent Labs    03/19/18 0700  LABPROT 15.7*  INR 1.26   Assessment / Plan: *Upper GI bleed:  Esophageal ulcer, non-bleeding, seen on EGD 5/22.  ? Radiation induced.  Biopsies pending.  No further sign of bleeding.  *Blood loss anemia:  S/p 2 units PRBC's for Hgb of 6.6 grams.  Was up to 7.3 grams yesterday.  No Hgb checked today.  *History esophageal squamous cell carcinoma diagnosed 04/2016.  Has completed palliative radiation and chemotherapy.   CT scan last month did not show any recurrence of adenopathy or esophageal mass.  *COPD, OSA:  On home oxygen.  -Will check STAT CBC this AM.  ? If he should get one more unit PRBC's prior to discharge. -Continue Pantoprazole 40 mg BID and carafate suspension BID.   LOS: 2 days   Laban Emperor. Zehr  03/20/2018, 11:45 AM   Attending physician's note   I have taken an interval history, reviewed the chart and examined the patient. I agree with the Advanced Practitioner's note, impression and recommendations.   Patient with upper GI bleeding due to esophageal ulcers, likely radiation-induced.  Biopsies negative for recurrence of esophageal carcinoma.  Plan: PPIs twice daily, Carafate twice daily, trend CBC.  Transfuse as needed.  Patient wanted to go home.  After convincing, he is willing to stay overnight.  Avoid nonsteroidals.   Carmell Austria, MD

## 2018-03-20 NOTE — Progress Notes (Signed)
Blood transfusion completed. Pt denied any discomfort. Will continue to monitor.  Ave Filter, RN

## 2018-03-20 NOTE — Progress Notes (Signed)
Blood transfusion started. Trnx rx reviewed w/ pt. Pt verbalized understanding of trnx rx. Will cotninue to monitor.  Ave Filter, RN

## 2018-03-20 NOTE — Progress Notes (Signed)
PROGRESS NOTE    [name]  NOB:[id number] DOB: 02/28/1946 DOA: 03/18/2018 PCP: Tammi Sou, MD    Brief Narrative:  72 year old male who presented with nausea, vomiting and melena.  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  Reported 3 days of dark stools, 24 hours of nausea and vomiting.  Initial physical examination blood pressure 104/74, heart rate 115, respirate 18, temperature 98.3, saturation 92%.  Moist mucous membranes, lungs with increased work of breathing, diminished breath sounds at bases, no wheezing, heart S1-2 present, distant, no gallops, rubs or murmurs, abdomen was soft nontender, no lower extremity edema.  Sodium 136, potassium 4.6, chloride 92, bicarbonate 28, glucose 170, BUN 47, creatinine 1.32, white count 11.3, hemoglobin 6.6, hematocrit 21.1, platelets 164.  Urinalysis negative for infection  Patient was admitted to the hospital working diagnosis acute blood loss anemia due to upper GI bleed.  Assessment & Plan:   Principal Problem:   GIB (gastrointestinal bleeding) Active Problems:   Diabetes mellitus without complication (HCC)   Dyslipidemia   Morbid obesity (HCC)   Essential hypertension   Chronic diastolic CHF (congestive heart failure) (HCC)   Hypoxemia   Coronary atherosclerosis of native coronary artery   OSA (obstructive sleep apnea)   COPD (chronic obstructive pulmonary disease) (HCC)   PND (paroxysmal nocturnal dyspnea)   Cancer of cardio-esophageal junction ([place])   Port catheter in place   1.  Acute blood loss anemia due to upper GI bleed.  -Patient still has low hemoglobin level.  Agree with transfusing and have placed order.  I have recommended monitoring until tomorrow in the a.m. we can  reassess hemoglobin levels and if stable around the upper eights and 9 after transfusion we will plan on discharging.  If hemoglobin 7 tomorrow after transfusion then we would suspect continued bleeding.  2.  Acute kidney injury.  Resolved currently.  Serum creatinine at 1.0  3.  Type 2 diabetes mellitus. Advance diet after egd, will continue glucose cover and monitoring with insulin sliding scale. Basal insulin regime with nph 10 units bid.   4.  Dyslipidemia. Continue statin therapy   5. Dyslipidemia. Continue atorvastatin and zetia  6. Depression. No confusion or agitation, continue citalopram, clonazepam  DVT prophylaxis: scd    Code Status: dnr Family Communication: I spoke with patient's family at the bedside and all questions were addressed.  Disposition Plan: possible dc in am if hb and hct stable after transfusion   Consultants:   GI   Procedures:     Antimicrobials:       Subjective: Patient has no new complaints.  No acute issues overnight  Objective: Vitals:   03/20/18 0509 03/20/18 1300 03/20/18 1455 03/20/18 1520  BP: (!) 145/80 (!) 110/57 95/62 (!) 110/49  Pulse: 84 79 83 85  Resp: 18 18 18 18   Temp: 98.4 F (36.9 C) 98.4 F (36.9 C) 97.9 F (36.6 C) 98.4 F (36.9 C)  TempSrc: Oral Oral Oral Oral  SpO2: 100% 96% 97% 96%  Weight:      Height:        Intake/Output Summary (Last 24 hours) at 03/20/2018 1648 Last data filed at 03/20/2018 1506 Gross per 24 hour  Intake 1155 ml  Output 450 ml  Net 705 ml   Filed Weights   03/18/18 1209 03/18/18 2013 03/19/18  0908  Weight: 132.9 kg (293 lb) 123.8 kg (272 lb 14.9 oz) 123.4 kg (272 lb)    Examination:   General: Pt in nad, alert and awake Neurology: Awake and alert, non focal  E ENT: no pallor, no icterus, oral mucosa moist Cardiovascular: No JVD. S1-S2 present, rhythmic, no gallops, rubs, or murmurs. No lower extremity edema. Pulmonary: vesicular breath sounds bilaterally, adequate air  movement, no wheezing, rhonchi or rales. Gastrointestinal. Abdomen protuberant no organomegaly, non tender, no rebound or guarding Skin. No rashes Musculoskeletal: no joint deformities   Data Reviewed: I have personally reviewed following labs and imaging studies  CBC: Recent Labs  Lab 03/18/18 1315 03/18/18 2043 03/19/18 0700  WBC 11.3*  --  7.0  HGB 6.6* 7.0* 7.3*  HCT 21.1* 22.1* 22.7*  MCV 103.4*  --  96.2  PLT 164  --  366*   Basic Metabolic Panel: Recent Labs  Lab 03/18/18 1315 03/19/18 0700  NA 136 137  K 4.6 4.1  CL 92* 95*  CO2 28 32  GLUCOSE 170* 157*  BUN 47* 52*  CREATININE 1.32* 1.08  CALCIUM 8.7* 8.3*   GFR: Estimated Creatinine Clearance: 80.3 mL/min (by C-G formula based on SCr of 1.08 mg/dL). Liver Function Tests: Recent Labs  Lab 03/18/18 1315  AST 70*  ALT 48  ALKPHOS 53  BILITOT 1.1  PROT 6.6  ALBUMIN 3.0*   Recent Labs  Lab 03/18/18 1315  LIPASE 22   No results for input(s): AMMONIA in the last 168 hours. Coagulation Profile: Recent Labs  Lab 03/19/18 0700  INR 1.26   Cardiac Enzymes: No results for input(s): CKTOTAL, CKMB, CKMBINDEX, TROPONINI in the last 168 hours. BNP (last 3 results) No results for input(s): PROBNP in the last 8760 hours. HbA1C: No results for input(s): HGBA1C in the last 72 hours. CBG: Recent Labs  Lab 03/19/18 1637 03/19/18 2217 03/20/18 0717 03/20/18 1232 03/20/18 1644  GLUCAP 209* 175* 149* 217* 208*   Lipid Profile: No results for input(s): CHOL, HDL, LDLCALC, TRIG, CHOLHDL, LDLDIRECT in the last 72 hours. Thyroid Function Tests: No results for input(s): TSH, T4TOTAL, FREET4, T3FREE, THYROIDAB in the last 72 hours. Anemia Panel: No results for input(s): VITAMINB12, FOLATE, FERRITIN, TIBC, IRON, RETICCTPCT in the last 72 hours.    Radiology Studies: I have reviewed all of the imaging during this hospital visit personally     Scheduled Meds: . atorvastatin  80 mg Oral Daily  .  citalopram  20 mg Oral Daily  . ezetimibe  10 mg Oral Daily  . gabapentin  300 mg Oral TID  . insulin aspart  0-9 Units Subcutaneous TID WC  . insulin NPH Human  10 Units Subcutaneous BID AC & HS  . loratadine  10 mg Oral Daily  . omega-3 acid ethyl esters  1 g Oral BID  . pantoprazole  40 mg Oral BID  . sodium chloride flush  3 mL Intravenous Q12H  . sucralfate  1 g Oral BID   Continuous Infusions: . sodium chloride 250 mL (03/19/18 2222)     LOS: 2 days    Velvet Bathe, MD [facility] Pager [phone]

## 2018-03-21 LAB — CBC
HEMATOCRIT: 24 % — AB (ref 39.0–52.0)
HEMATOCRIT: 27.9 % — AB (ref 39.0–52.0)
HEMOGLOBIN: 8.7 g/dL — AB (ref 13.0–17.0)
Hemoglobin: 7.5 g/dL — ABNORMAL LOW (ref 13.0–17.0)
MCH: 29.8 pg (ref 26.0–34.0)
MCH: 29.7 pg (ref 26.0–34.0)
MCHC: 31.3 g/dL (ref 30.0–36.0)
MCHC: 31.2 g/dL (ref 30.0–36.0)
MCV: 95.2 fL (ref 78.0–100.0)
MCV: 95.2 fL (ref 78.0–100.0)
PLATELETS: 94 10*3/uL — AB (ref 150–400)
Platelets: 103 10*3/uL — ABNORMAL LOW (ref 150–400)
RBC: 2.52 MIL/uL — AB (ref 4.22–5.81)
RBC: 2.93 MIL/uL — AB (ref 4.22–5.81)
RDW: 20.5 % — ABNORMAL HIGH (ref 11.5–15.5)
RDW: 19.8 % — AB (ref 11.5–15.5)
WBC: 4.3 10*3/uL (ref 4.0–10.5)
WBC: 5.3 10*3/uL (ref 4.0–10.5)

## 2018-03-21 LAB — GLUCOSE, CAPILLARY
Glucose-Capillary: 128 mg/dL — ABNORMAL HIGH (ref 65–99)
Glucose-Capillary: 167 mg/dL — ABNORMAL HIGH (ref 65–99)
Glucose-Capillary: 172 mg/dL — ABNORMAL HIGH (ref 65–99)
Glucose-Capillary: 190 mg/dL — ABNORMAL HIGH (ref 65–99)
Glucose-Capillary: 257 mg/dL — ABNORMAL HIGH (ref 65–99)

## 2018-03-21 LAB — PREPARE RBC (CROSSMATCH)

## 2018-03-21 MED ORDER — SODIUM CHLORIDE 0.9 % IV SOLN: Freq: Once | INTRAVENOUS | Status: DC

## 2018-03-21 NOTE — Progress Notes (Addendum)
[facility] Progress Note  CC:  Esophageal ulcer  Subjective:  Received another unit of PRBC's yesterday but Hgb did not increase at all.  He has not passed any stool.  He is not happy about possibly having to stay in the hospital today.  Objective:  Vital signs in last 24 hours: Temp:  [97.9 F (36.6 C)-98.4 F (36.9 C)] 98.2 F (36.8 C) (05/24 7494) Pulse Rate:  [74-85] 83 (05/24 0608) Resp:  [16-18] 16 (05/24 0608) BP: (95-116)/(49-74) 116/74 (05/24 0608) SpO2:  [96 %-99 %] 96 % (05/24 0608) Last BM Date: 03/19/18 General:  Alert, Well-developed, in NAD; pale Heart:  Regular rate and rhythm; no murmurs Pulm:  Decreased breath sounds but clear. Abdomen:  Soft, non-distended.  BS present.  Non-tender. Extremities:  Without edema. Neurologic:  Alert and oriented x 4;  grossly normal neurologically. Psych:  Alert and cooperative. Normal mood and affect.  Intake/Output from previous day: 05/23 0701 - 05/24 0700 In: 1038 [P.O.:720; I.V.:3; Blood:315] Out: 250 [Urine:250]  Lab Results: Recent Labs    03/19/18 0700 03/20/18 2049 03/21/18 0430  WBC 7.0 4.9 4.3  HGB 7.3* 7.7* 7.5*  HCT 22.7* 24.3* 24.0*  PLT 100* 93* 94*   BMET Recent Labs    03/18/18 1315 03/19/18 0700  NA 136 137  K 4.6 4.1  CL 92* 95*  CO2 28 32  GLUCOSE 170* 157*  BUN 47* 52*  CREATININE 1.32* 1.08  CALCIUM 8.7* 8.3*   LFT Recent Labs    03/18/18 1315  PROT 6.6  ALBUMIN 3.0*  AST 70*  ALT 48  ALKPHOS 53  BILITOT 1.1   PT/INR Recent Labs    03/19/18 0700  LABPROT 15.7*  INR 1.26   Assessment / Plan: *Upper GI bleed:  Esophageal ulcer, non-bleeding, seen on EGD 5/22.  ? Radiation induced.  Biopsies with chronic inflammation and focal ulceration.  No further sign of bleeding clinically but Hgb did not increase after another unit PRBC's yesterday.  *Blood loss anemia:  S/p 2 units PRBC's for Hgb of 6.6 grams.  Was up to 7.7 grams yesterday.  They transfused  another unit PRBC's but Hgb not increased this morning, it is only 7.5 grams.  *History esophageal squamous cell carcinoma diagnosed 04/2016. Has completed palliative radiation and chemotherapy. CT scan last month did not show any recurrence of adenopathy or esophageal mass.  *COPD, OSA:  On home oxygen.  -Will discuss with Dr. Lyndel Safe.  Does not appear to be bleeding clinically but Hgb did not increase at all after another unit PRBC's yesterday.  Patient not happy about possibly having to stay in the hospital longer. -Continue Pantoprazole 40 mg BID and carafate suspension BID.  **Addendum:  Going to transfuse one more unit PRBC's and then recheck CBC 2 hours post transfusion.  If Hgb increased then can be discharged.  Office visit follow-up has been made and is in discharge paperwork.  Will need repeat EGD in 6-8 weeks.   LOS: 3 days   [name]. Zehr  03/21/2018, 8:55 AM    Attending physician's note   I have taken an interval history, reviewed the chart and examined the patient. I agree with the Advanced Practitioner's note, impression and recommendations.  No Signs of any overt bleeding. But Hb 7.5 after blood transfusion yesterday (Had 3U this admission).  Adamant to go home today.  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  Biopsies were negative for recurrence of esophageal carcinoma.  He has  agreed to get another unit of blood today.  Continue Protonix 40 mg p.o. twice daily and Carafate suspension twice daily.  Repeat EGD in 6 to 8 weeks.  He will get CBC checked early next week as well. 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  No significant varices on the EGD during this admission but had significant esophagitis and esophageal ulcers.  [name] Austria, MD

## 2018-03-21 NOTE — Progress Notes (Signed)
PROGRESS NOTE    [name]  YKZ:[id number] DOB: 1945/12/14 DOA: 03/18/2018 PCP: Tammi Sou, MD    Brief Narrative:  72 year old male who presented with nausea, vomiting and melena.  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  Reported 3 days of dark stools, 24 hours of nausea and vomiting.  Initial physical examination blood pressure 104/74, heart rate 115, respirate 18, temperature 98.3, saturation 92%.  Moist mucous membranes, lungs with increased work of breathing, diminished breath sounds at bases, no wheezing, heart S1-2 present, distant, no gallops, rubs or murmurs, abdomen was soft nontender, no lower extremity edema.  Sodium 136, potassium 4.6, chloride 92, bicarbonate 28, glucose 170, BUN 47, creatinine 1.32, white count 11.3, hemoglobin 6.6, hematocrit 21.1, platelets 164.  Urinalysis negative for infection  Patient was admitted to the hospital working diagnosis acute blood loss anemia due to upper GI bleed.  Assessment & Plan:   Principal Problem:   GIB (gastrointestinal bleeding) Active Problems:   Diabetes mellitus without complication (HCC)   Dyslipidemia   Morbid obesity (HCC)   Essential hypertension   Chronic diastolic CHF (congestive heart failure) (HCC)   Hypoxemia   Coronary atherosclerosis of native coronary artery   OSA (obstructive sleep apnea)   COPD (chronic obstructive pulmonary disease) (HCC)   PND (paroxysmal nocturnal dyspnea)   Cancer of cardio-esophageal junction ([place])   Port catheter in place   1.  Acute blood loss anemia due to upper GI bleed.  -Patient transfused and still had low hemoglobin levels.  Plan was for transfusion again today and then checking CBC 2 hours after transfusion to decide on  disposition.  CBC will be resulted after 6 or 630 which will make discharge for today not likely.  We will continue to observe and reassess CBC next a.m.  2.  Acute kidney injury.  Resolved currently.  Serum creatinine at 1.0  3.  Type 2 diabetes mellitus. Advance diet after egd, will continue glucose cover and monitoring with insulin sliding scale. Basal insulin regime with nph 10 units bid.   4.  Dyslipidemia. Continue statin therapy   5. Dyslipidemia. Continue atorvastatin and zetia  6. Depression. No confusion or agitation, continue citalopram, clonazepam  DVT prophylaxis: scd    Code Status: dnr Family Communication: Called daughter but no response Disposition Plan: possible dc in am if hb and hct stable after transfusion, I am suspecting patient does not have medical capacity to make his own decisions as I have explained things to him slowly and repeatedly and he does not seem to understand   Consultants:   GI   Procedures:     Antimicrobials:    none  Subjective: No new complaints but patient wants to go home.  I explained that his blood levels were not where they should be despite transfusion and that this raises suspicion for further GI blood loss.  He does not seem to understand however and keeps complaining about having to stay in the hospital.  Objective: Vitals:   03/21/18 0608 03/21/18 1230 03/21/18 1315 03/21/18 1547  BP: 116/74 104/70 103/85 110/64  Pulse: 83 77 86 84  Resp: 16 16 16 16   Temp: 98.2 F (36.8 C) 98.2 F (36.8 C) 99.3 F (37.4 C) 98.7 F (37.1 C)  TempSrc: Oral Oral Oral Oral  SpO2: 96% 95% 100%  100%  Weight:      Height:        Intake/Output Summary (Last 24 hours) at 03/21/2018 1631 Last data filed at 03/21/2018 1547 Gross per 24 hour  Intake 1109.67 ml  Output -  Net 1109.67 ml   Filed Weights   03/18/18 1209 03/18/18 2013 03/19/18 0908  Weight: 132.9 kg (293 lb) 123.8 kg (272 lb 14.9 oz) 123.4 kg (272 lb)    Examination:    General: Pt in nad, alert and awake Neurology: Awake and alert, non focal  E ENT: no pallor, no icterus, oral mucosa moist Cardiovascular: No JVD. S1-S2 present, rhythmic, no gallops, rubs, or murmurs. No lower extremity edema. Pulmonary: vesicular breath sounds bilaterally, adequate air movement, no wheezing, rhonchi or rales. Gastrointestinal. Abdomen protuberant no organomegaly, non tender, no rebound or guarding Skin. No rashes Musculoskeletal: no joint deformities   Data Reviewed: I have personally reviewed following labs and imaging studies  CBC: Recent Labs  Lab 03/18/18 1315 03/18/18 2043 03/19/18 0700 03/20/18 2049 03/21/18 0430  WBC 11.3*  --  7.0 4.9 4.3  HGB 6.6* 7.0* 7.3* 7.7* 7.5*  HCT 21.1* 22.1* 22.7* 24.3* 24.0*  MCV 103.4*  --  96.2 96.0 95.2  PLT 164  --  100* 93* 94*   Basic Metabolic Panel: Recent Labs  Lab 03/18/18 1315 03/19/18 0700  NA 136 137  K 4.6 4.1  CL 92* 95*  CO2 28 32  GLUCOSE 170* 157*  BUN 47* 52*  CREATININE 1.32* 1.08  CALCIUM 8.7* 8.3*   GFR: Estimated Creatinine Clearance: 80.3 mL/min (by C-G formula based on SCr of 1.08 mg/dL). Liver Function Tests: Recent Labs  Lab 03/18/18 1315  AST 70*  ALT 48  ALKPHOS 53  BILITOT 1.1  PROT 6.6  ALBUMIN 3.0*   Recent Labs  Lab 03/18/18 1315  LIPASE 22   No results for input(s): AMMONIA in the last 168 hours. Coagulation Profile: Recent Labs  Lab 03/19/18 0700  INR 1.26   Cardiac Enzymes: No results for input(s): CKTOTAL, CKMB, CKMBINDEX, TROPONINI in the last 168 hours. BNP (last 3 results) No results for input(s): PROBNP in the last 8760 hours. HbA1C: No results for input(s): HGBA1C in the last 72 hours. CBG: Recent Labs  Lab 03/20/18 1232 03/20/18 1644 03/20/18 2148 03/21/18 0750 03/21/18 1137  GLUCAP 217* 208* 209* 172* 257*   Lipid Profile: No results for input(s): CHOL, HDL, LDLCALC, TRIG, CHOLHDL, LDLDIRECT in the last 72 hours. Thyroid Function  Tests: No results for input(s): TSH, T4TOTAL, FREET4, T3FREE, THYROIDAB in the last 72 hours. Anemia Panel: No results for input(s): VITAMINB12, FOLATE, FERRITIN, TIBC, IRON, RETICCTPCT in the last 72 hours.    Radiology Studies: I have reviewed all of the imaging during this hospital visit personally     Scheduled Meds: . atorvastatin  80 mg Oral Daily  . citalopram  20 mg Oral Daily  . ezetimibe  10 mg Oral Daily  . gabapentin  300 mg Oral TID  . insulin aspart  0-9 Units Subcutaneous TID WC  . insulin NPH Human  10 Units Subcutaneous BID AC & HS  . loratadine  10 mg Oral Daily  . omega-3 acid ethyl esters  1 g Oral BID  . pantoprazole  40 mg Oral BID  . sodium chloride flush  3 mL Intravenous Q12H  . sucralfate  1 g Oral BID   Continuous Infusions: . sodium chloride 250 mL (03/19/18 2222)  . sodium chloride  LOS: 3 days    Velvet Bathe, MD [facility] Pager [phone]

## 2018-03-21 NOTE — Progress Notes (Signed)
Inpatient Diabetes Program Recommendations  AACE/ADA: New Consensus Statement on Inpatient Glycemic Control (2015)  Target Ranges:  Prepandial:   less than 140 mg/dL      Peak postprandial:   less than 180 mg/dL (1-2 hours)      Critically ill patients:  140 - 180 mg/dL   Results for LEIGHTON, BRICKLEY" (MRN [id number]) as of 03/21/2018 10:52  Ref. Range 03/20/2018 07:17 03/20/2018 12:32 03/20/2018 16:44 03/20/2018 21:48  Glucose-Capillary Latest Ref Range: 65 - 99 mg/dL 149 (H) 217 (H) 208 (H) 209 (H)   Results for EWEN, VARNELL" (MRN [id number]) as of 03/21/2018 10:52  Ref. Range 03/21/2018 07:50  Glucose-Capillary Latest Ref Range: 65 - 99 mg/dL 172 (H)    Home DM Meds: NPH 16 units BID        Humalog 16 units BID        Metformin 1000 mg BID  Current Orders: NPH 10 units BID      Novolog Sensitive Correction Scale/ SSI (0-9 units) TID AC     MD- Please consider the following in-hospital insulin adjustments:  1. Increase NPH Insulin slightly to 12 units BID  2. Start low dose Novolog Meal Coverage: Novolog 3 units TID with meals (hold if pt eats <50% of meal)      --Will follow patient during hospitalization--  Wyn Quaker RN, MSN, CDE Diabetes Coordinator Inpatient Glycemic Control Team Team Pager: [phone] (8a-5p)

## 2018-03-22 DIAGNOSIS — K264 Chronic or unspecified duodenal ulcer with hemorrhage: Secondary | ICD-10-CM

## 2018-03-22 LAB — GLUCOSE, CAPILLARY
Glucose-Capillary: 178 mg/dL — ABNORMAL HIGH (ref 65–99)
Glucose-Capillary: 225 mg/dL — ABNORMAL HIGH (ref 65–99)

## 2018-03-22 LAB — TYPE AND SCREEN
ABO/RH(D): O POS
Antibody Screen: NEGATIVE
UNIT DIVISION: 0
Unit division: 0
Unit division: 0
Unit division: 0

## 2018-03-22 LAB — BPAM RBC
BLOOD PRODUCT EXPIRATION DATE: 201906162359
Blood Product Expiration Date: 201906162359
Blood Product Expiration Date: 201906202359
Blood Product Expiration Date: 201906222359
ISSUE DATE / TIME: 201905211553
ISSUE DATE / TIME: 201905220049
ISSUE DATE / TIME: 201905231500
ISSUE DATE / TIME: 201905241300
UNIT TYPE AND RH: 5100
UNIT TYPE AND RH: 5100
Unit Type and Rh: 5100
Unit Type and Rh: 5100

## 2018-03-22 MED ORDER — SUCRALFATE 1 GM/10ML PO SUSP: 1.0000 g | Freq: Two times a day (BID) | ORAL | 0 refills | Status: DC

## 2018-03-22 NOTE — Discharge Summary (Signed)
Physician Discharge Summary  [name] YSA:[id number] DOB: 10-19-1946 DOA: 03/18/2018  PCP: Tammi Sou, MD  Admit date: 03/18/2018 Discharge date: 03/22/2018  Time spent: 35 minutes  Recommendations for Outpatient Follow-up:  1. Please reassess hgb levels and blood sugars. Adjust hypoglycemic agents accordingly   Discharge Diagnoses:  Principal Problem:   GIB (gastrointestinal bleeding) Active Problems:   Diabetes mellitus without complication (HCC)   Dyslipidemia   Morbid obesity ([place])   Essential hypertension   Chronic diastolic CHF (congestive heart failure) (HCC)   Hypoxemia   Coronary atherosclerosis of native coronary artery   OSA (obstructive sleep apnea)   COPD (chronic obstructive pulmonary disease) (HCC)   PND (paroxysmal nocturnal dyspnea)   Cancer of cardio-esophageal junction ([place])   Port catheter in place   Discharge Condition: stable  Diet recommendation: diabetic diet.  Filed Weights   03/18/18 1209 03/18/18 2013 03/19/18 0908  Weight: 132.9 kg (293 lb) 123.8 kg (272 lb 14.9 oz) 123.4 kg (272 lb)    History of present illness:  72 year old male who presented with nausea, vomiting and melena.  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  Hospital Course:  Acute blood loss anemia - secondary to GI losses from ulcers - transfused  Upper gi bleed - GI consulted who recommended the following: No Signs of any overt bleeding. But Hb 7.5 after blood transfusion yesterday (Had 3U this admission).  Adamant to go home today.  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  Biopsies were negative for  recurrence of esophageal carcinoma.  He has agreed to get another unit of blood today.  Continue Protonix 40 mg p.o. twice daily and Carafate suspension twice daily.  Repeat EGD in 6 to 8 weeks.  He will get CBC checked early next week as well. 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  No significant varices on the EGD during this admission but had significant esophagitis and esophageal ulcers.  Otherwise for known medical conditions prior to admission will continue prior to admission medication regimen  Procedures:  EGD  Consultations:  GI: Dr Lyndel Safe  Discharge Exam: Vitals:   03/21/18 2102 03/22/18 0509  BP: 124/69 116/75  Pulse: 79 83  Resp: 20 19  Temp: 98.6 F (37 C) 98.7 F (37.1 C)  SpO2: 98% 96%    General: pt in nad, alert and awake Cardiovascular: rrr, no rubs Respiratory: no increased wob, no wheezes  Discharge Instructions   Discharge Instructions    Call MD for:  severe uncontrolled pain   Complete by:  As directed    Call MD for:  temperature >100.4   Complete by:  As directed    Diet - low sodium heart healthy   Complete by:  As directed    Discharge instructions   Complete by:  As directed    Please follow up with your primary care physician within the next 1 week to assess your blood levels.   Increase activity slowly   Complete by:  As directed      Allergies as of 03/22/2018   No Known Allergies     Medication List    STOP taking these medications   aspirin 81 MG tablet   oxyCODONE  5 MG immediate release tablet Commonly known as:  Oxy IR/ROXICODONE     TAKE these medications   atorvastatin 80 MG tablet Commonly known as:  LIPITOR TAKE 1 TABLET BY MOUTH ONCE DAILY   baclofen 10 MG tablet Commonly known as:  LIORESAL 1/2 - 1 tab po q8h prn muscle spasms   beta carotene w/minerals tablet Take 1 tablet by mouth daily.   cetirizine 10 MG tablet Commonly known as:  ZYRTEC Take 10 mg by mouth  daily.   citalopram 20 MG tablet Commonly known as:  CELEXA TAKE 1 TABLET BY MOUTH ONCE DAILY   clonazePAM 1 MG tablet Commonly known as:  KLONOPIN TAKE 1 TABLET BY MOUTH TWICE DAILY AS NEEDED FOR  ANXIETY   clotrimazole-betamethasone cream Commonly known as:  LOTRISONE Apply to affected area twice daily   cromolyn 4 % ophthalmic solution Commonly known as:  OPTICROM Place 1 drop into both eyes. 3 to 4 times daily   [employer] FISH OIL 1000 MG Caps Take 2 tablets by mouth 2 (two) times daily.   ezetimibe 10 MG tablet Commonly known as:  ZETIA Take 1 tablet (10 mg total) by mouth daily.   furosemide 20 MG tablet Commonly known as:  LASIX Take 1 tablet (20 mg total) by mouth daily.   gabapentin 300 MG capsule Commonly known as:  NEURONTIN TAKE ONE CAPSULE BY MOUTH THREE TIMES DAILY   HUMULIN N KWIKPEN 100 UNIT/ML Kiwkpen Generic drug:  Insulin NPH (Human) ([place]) INJECT 16 UNITS EVERY MORNING AND 16 UNITS EVERY EVENING   Insulin NPH (Human) (Isophane) 100 UNIT/ML Kiwkpen Commonly known as:  HUMULIN N 16 units every morning and 16 units every evening   hydrOXYzine 25 MG tablet Commonly known as:  ATARAX/VISTARIL TAKE 1 TABLET BY MOUTH AT SUPPER AND 1 TAB AT BEDTIME FOR INSOMNIA   Insulin Lispro 200 UNIT/ML Sopn Commonly known as:  HUMALOG KWIKPEN Inject 20 Units into the skin 2 (two) times daily with a meal. What changed:    how much to take  additional instructions   magnesium chloride 64 MG Tbec SR tablet Commonly known as:  SLOW-MAG Take 1 tablet (64 mg total) by mouth daily.   metFORMIN 1000 MG tablet Commonly known as:  GLUCOPHAGE TAKE 1 TABLET BY MOUTH TWICE DAILY WITH MEALS   ONE TOUCH ULTRA TEST test strip Generic drug:  glucose blood USE  STRIP TO CHECK GLUCOSE THREE TIMES DAILY AS  DIRECTED   ONETOUCH DELICA LANCETS 80K Misc USE ONE  TO CHECK GLUCOSE THREE TIMES DAILY   OXYGEN Inhale 2 L/min into the lungs continuous.   pantoprazole 40 MG  tablet Commonly known as:  PROTONIX Take 1 tablet (40 mg total) by mouth 2 (two) times daily.   sucralfate 1 GM/10ML suspension Commonly known as:  CARAFATE Take 10 mLs (1 g total) by mouth 2 (two) times daily.      No Known Allergies Follow-up Information    Loralie Champagne, PA-C Follow up on 04/03/2018.   Specialty:  Gastroenterology Why:  2:00 pm Contact information: [place] [address] [phone]            The results of significant diagnostics from this hospitalization (including imaging, microbiology, ancillary and laboratory) are listed below for reference.    Significant Diagnostic Studies: No results found.  Microbiology: No results found for this or any previous visit (from the past 240 hour(s)).   Labs: Basic Metabolic Panel: Recent Labs  Lab 03/18/18  1315 03/19/18 0700  NA 136 137  K 4.6 4.1  CL 92* 95*  CO2 28 32  GLUCOSE 170* 157*  BUN 47* 52*  CREATININE 1.32* 1.08  CALCIUM 8.7* 8.3*   Liver Function Tests: Recent Labs  Lab 03/18/18 1315  AST 70*  ALT 48  ALKPHOS 53  BILITOT 1.1  PROT 6.6  ALBUMIN 3.0*   Recent Labs  Lab 03/18/18 1315  LIPASE 22   No results for input(s): AMMONIA in the last 168 hours. CBC: Recent Labs  Lab 03/18/18 1315 03/18/18 2043 03/19/18 0700 03/20/18 2049 03/21/18 0430 03/21/18 2100  WBC 11.3*  --  7.0 4.9 4.3 5.3  HGB 6.6* 7.0* 7.3* 7.7* 7.5* 8.7*  HCT 21.1* 22.1* 22.7* 24.3* 24.0* 27.9*  MCV 103.4*  --  96.2 96.0 95.2 95.2  PLT 164  --  100* 93* 94* 103*   Cardiac Enzymes: No results for input(s): CKTOTAL, CKMB, CKMBINDEX, TROPONINI in the last 168 hours. BNP: BNP (last 3 results) No results for input(s): BNP in the last 8760 hours.  ProBNP (last 3 results) No results for input(s): PROBNP in the last 8760 hours.  CBG: Recent Labs  Lab 03/21/18 1725 03/21/18 2107 03/21/18 2351 03/22/18 0745 03/22/18 1142  GLUCAP 167* 128* 190* 178* 225*        Signed:  Velvet Bathe MD.  [facility] 03/22/2018, 12:17 PM

## 2018-03-22 NOTE — Progress Notes (Signed)
Discharge instructions reviewed with patient and daughter utilizing teach back method no questions at this time.  Patient discharge to home.

## 2018-03-27 ENCOUNTER — Telehealth: Payer: Self-pay

## 2018-03-27 NOTE — Telephone Encounter (Signed)
Per Dr Lyndel Safe, her personally informed the patient of the biopsy results.

## 2018-03-28 ENCOUNTER — Ambulatory Visit (INDEPENDENT_AMBULATORY_CARE_PROVIDER_SITE_OTHER): Payer: Medicare Other | Admitting: Family Medicine

## 2018-03-28 ENCOUNTER — Encounter: Payer: Self-pay | Admitting: Family Medicine

## 2018-03-28 ENCOUNTER — Inpatient Hospital Stay: Payer: Medicare Other | Admitting: Family Medicine

## 2018-03-28 VITALS — BP 112/70 | HR 88 | Temp 98.1°F | Resp 16 | Ht 68.0 in | Wt 289.4 lb

## 2018-03-28 DIAGNOSIS — K7460 Unspecified cirrhosis of liver: Secondary | ICD-10-CM | POA: Diagnosis not present

## 2018-03-28 DIAGNOSIS — R7401 Elevation of levels of liver transaminase levels: Secondary | ICD-10-CM

## 2018-03-28 DIAGNOSIS — R740 Nonspecific elevation of levels of transaminase and lactic acid dehydrogenase [LDH]: Secondary | ICD-10-CM

## 2018-03-28 DIAGNOSIS — D500 Iron deficiency anemia secondary to blood loss (chronic): Secondary | ICD-10-CM | POA: Diagnosis not present

## 2018-03-28 DIAGNOSIS — K922 Gastrointestinal hemorrhage, unspecified: Secondary | ICD-10-CM | POA: Diagnosis not present

## 2018-03-28 NOTE — Progress Notes (Signed)
03/28/2018  CC:  Chief Complaint  Patient presents with  . Hospitalization Follow-up    Patient is a 72 y.o. Caucasian male who presents for  hospital follow up. Dates hospitalized: 5/21-5/25, 2019. Days since d/c from hospital: 6 Patient was discharged from hospital to home. Reason for admission to hospital: n/v/melena, anemic, found to have gastric and esoph ulcers on EGD (believed to be radiation-induced).  No recurrence of his esophageal cancer was found.  He was put on bid carafate and bid protonix. He got transfused 4 units of pRBCs.  CBC prior to d/c home was 8.7 (on 03/21/18).  I have reviewed patient's discharge summary plus pertinent specific notes, labs, and imaging from the hospitalization.   He notes that he presented to the hospital after gradually worsening fatigue + melena.  Since going home from hospital he has had a couple hard/gritty BM's and had some black in it.  Nothing like the ones prior to going to the hospital.   He has been instructed to f/u with GI 04/03/18 and they will be planning a f/u EGD. Of note, he has had CT scans documenting liver changes c/w cirrhosis since 04/2016.  It was suggested by disharging hospitalist to possible further evaluate this.  He last drank >30 yrs ago.  "A weekend drunk" per his description. No esoph varices seen on recent EGD in hosp.  Says he feels fine/good now.  Getting some strength back. He has f/u appt with oncologist, Dr. Burr Medico, on 04/15/18.    Medication reconciliation was done today and patient is taking meds as recommended by discharging hospitalist/specialist.    PMH:  Past Medical History:  Diagnosis Date  . Asthma    as a child  . CAD in native artery    a. cardiac cath 09/2013 showed severely diseased mLAD and diagonal, mild LCx disease, moderate RCA disease, LVEDP 20, EF 50%.  . Cataract    multiple types, bilateral  . Cholelithiasis without cholecystitis   . Chronic diastolic CHF (congestive heart failure)  ([place]) 02/25/2009  . Chronic renal insufficiency, stage III (moderate) (HCC) 2017   Stage II/III (GFR around 60)  . 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  CT 11/2017 w/ascites and persistent bilat pleural effusions  . 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 COPD (chronic obstructive pulmonary disease) ([place])   . Coronary artery disease    chronically occluded LAD and diagonal with right to left collaterals, mild disease in the left circ and moderate disease in the mid RCA on medical management with Imdur, ASA, and Plavix.  . Dermatitis 05/2014  . DIABETES MELLITUS, TYPE II 06/24/2007   No diab retpthy as of 08/05/15 eye exam.  . Esophageal cancer ([place]) 04/2016   poorly differentiated carcinoma (Dr. Pyrtle--EGD).  CT C/A/P showed metastatic adenopathy in mediastinum and upper abdomen 05/09/16.  Tx plan is palliative radiation (completed 06/22/16), then palliative systemic chemotherapy (carbo+taxol) was started but as of 08/03/16 onc f/u this was held due to severe knee and ankle arthralgias.  Restarting as of 10/2015 (08/2016 CT showed some disease regression  . Esophageal cancer ([place])    CT 12/2016 showed no residual esoph mass--plan to continue chemo.  Ongoing palliative chemo with carboplatin and taxol q 2 wks as of 03/2017 onc f/u (CT 12/2016 and 04/2017 showed no progression of dz).  Taking a 2 mo break from chemo as of 05/2017.  Pleural effusion-- improved  with lasix but onc wanted him to get dx/therap thoracentesis-pt declined.  CT C/A/P no progression/recurr/mets 11/2017.  Marland Kitchen Essential hypertension 05/01/2007   Qualifier: Diagnosis of  By: Tiney Rouge CMA, Ellison Hughs     . History of kidney stones   . Hyperkalemia 12/2015   Decreased ACE-I by 50% in response, then potassium normalized.  Marland Kitchen HYPERLIPIDEMIA 06/24/2007  . Hypoxemia 01/27/2010  . Morbid obesity ([place])   . Myocardial infarction [facility])    pt states he was informed per MD that  he has had one but pt was unaware   . Obesity hypoventilation syndrome (HCC)    oxygen 24/7 (2 liters [place] as of 02/2016)  . On home oxygen therapy    Oxygen @ 2l/m nasally 24/7 hours  . OSA (obstructive sleep apnea)    not tested; pt scored 4 per stop bang tool results sent to PCP   . OSTEOARTHRITIS 05/01/2007  . Pancytopenia due to chemotherapy ([place]) 2018  . Pleural effusion on right 08/2017   ? malignant vs CHF: pt refuses diagnostic thoracentesis b/c he states he feels fine, wants to try diuretic first.  . Pleural effusion on right 09/2017   Thoracentesis  . Presbycusis of both ears 05/2015   [facility]  . Pruritic condition 05/2014   Allergist summer 2015, no new testing.  Marland Kitchen RBBB   . Visual field defect 05/10/2016   Per Dr. Melina Fiddler, O.D.: Rt, loss of inf/temp quad and some loss sup/temp.  ?CVA  ? Pituitary tumor? 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    PSH:  Past Surgical History:  Procedure Laterality Date  . APPENDECTOMY    . BALLOON DILATION N/A 05/04/2016   Procedure: BALLOON DILATION;  Surgeon: Jerene Bears, MD;  Location: [facility];  Service: Gastroenterology;  Laterality: N/A;  . BIOPSY  03/19/2018   Procedure: BIOPSY;  Surgeon: Jackquline Denmark, MD;  Location: [facility];  Service: Endoscopy;;  . CARDIAC CATHETERIZATION    . CARPAL TUNNEL RELEASE Left   . CATARACT EXTRACTION W/PHACO Right 04/29/2013   Procedure: CATARACT EXTRACTION PHACO AND INTRAOCULAR LENS PLACEMENT (IOC);  Surgeon: Adonis Brook, MD;  Location: [facility];  Service: Ophthalmology;  Laterality: Right;  . CATARACT EXTRACTION, BILATERAL    . COLONOSCOPY W/ POLYPECTOMY  08/2011   Many polyps--all hyperplastic, severe diverticulosis, int hem.  [employer] hemoccult testing via [facility] 06/22/15 NEG  . ESOPHAGOGASTRODUODENOSCOPY N/A 05/04/2016   Procedure: ESOPHAGOGASTRODUODENOSCOPY (EGD);  Surgeon: Jerene Bears, MD;  Location: [facility];  Service: Gastroenterology;  Laterality: N/A;   . ESOPHAGOGASTRODUODENOSCOPY (EGD) WITH PROPOFOL N/A 03/19/2018   Procedure: ESOPHAGOGASTRODUODENOSCOPY (EGD) WITH PROPOFOL;  Surgeon: Jackquline Denmark, MD;  Location: [facility];  Service: Endoscopy;  Laterality: N/A;  . IR GENERIC HISTORICAL  10/03/2016   IR US GUIDE VASC ACCESS RIGHT 10/03/2016 Greggory Keen, MD [facility]  . IR GENERIC HISTORICAL  10/03/2016   IR FLUORO GUIDE PORT INSERTION RIGHT 10/03/2016 Greggory Keen, MD [facility]  . KNEE ARTHROSCOPY WITH MEDIAL MENISECTOMY Right 12/16/2014   Procedure: RIGHT KNEE ARTHROSCOPY WITH MEDIAL MENISECTOMY microfracture medial femoral condyle abrasion condroplasty medial femoral condyle lateral menisectomy;  Surgeon: Tobi Bastos, MD;  Location: [facility];  Service: Orthopedics;  Laterality: Right;  . LEFT HEART CATHETERIZATION WITH CORONARY ANGIOGRAM N/A 10/26/2013   Procedure: LEFT HEART CATHETERIZATION WITH CORONARY ANGIOGRAM;  Surgeon: Jettie Booze, MD;  Location[facility];  Service: Cardiovascular;  Laterality: N/A;  . LUMBAR DISC SURGERY  2001  . SHOULDER SURGERY Right    right fx  . TONSILLECTOMY    . TRANSTHORACIC ECHOCARDIOGRAM  03/2016   EF 55-60%, grade I DD, LAE  . WRIST SURGERY Right    right fx    MEDS:  Outpatient Medications Prior to Visit  Medication Sig Dispense Refill  . atorvastatin (LIPITOR) 80 MG tablet TAKE 1 TABLET BY MOUTH ONCE DAILY 90 tablet 3  . baclofen (LIORESAL) 10 MG tablet 1/2 - 1 tab po q8h prn muscle spasms 30 each 1  . beta carotene w/minerals (OCUVITE) tablet Take 1 tablet by mouth daily.    . cetirizine (ZYRTEC) 10 MG tablet Take 10 mg by mouth daily.    . citalopram (CELEXA) 20 MG tablet TAKE 1 TABLET BY MOUTH ONCE DAILY 90 tablet 1  . clonazePAM (KLONOPIN) 1 MG tablet TAKE 1 TABLET BY MOUTH TWICE DAILY AS NEEDED FOR  ANXIETY 60 tablet 5  . clotrimazole-betamethasone (LOTRISONE) cream Apply to affected area twice daily 45 g 2  . cromolyn (OPTICROM) 4 % ophthalmic solution Place 1 drop  into both eyes. 3 to 4 times daily    . furosemide (LASIX) 20 MG tablet Take 1 tablet (20 mg total) by mouth daily. 90 tablet 1  . gabapentin (NEURONTIN) 300 MG capsule TAKE ONE CAPSULE BY MOUTH THREE TIMES DAILY 270 capsule 3  . hydrOXYzine (ATARAX/VISTARIL) 25 MG tablet TAKE 1 TABLET BY MOUTH AT SUPPER AND 1 TAB AT BEDTIME FOR INSOMNIA 60 tablet 6  . Insulin Lispro (HUMALOG KWIKPEN) 200 UNIT/ML SOPN Inject 20 Units into the skin 2 (two) times daily with a meal. 18 pen 6  . Insulin NPH, Human,, Isophane, (HUMULIN N) 100 UNIT/ML Kiwkpen 16 units every morning and 16 units every evening 15 mL 3  . magnesium chloride (SLOW-MAG) 64 MG TBEC SR tablet Take 1 tablet (64 mg total) by mouth daily. 60 tablet 0  . metFORMIN (GLUCOPHAGE) 1000 MG tablet TAKE 1 TABLET BY MOUTH TWICE DAILY WITH MEALS 180 tablet 1  . Omega-3 Fatty Acids ([employer] FISH OIL) 1000 MG CAPS Take 2 tablets by mouth 2 (two) times daily. 90 capsule   . ONE TOUCH ULTRA TEST test strip USE  STRIP TO CHECK GLUCOSE THREE TIMES DAILY AS  DIRECTED 100 each 11  . ONETOUCH DELICA LANCETS 81E MISC USE ONE  TO CHECK GLUCOSE THREE TIMES DAILY 100 each 11  . OXYGEN Inhale 2 L/min into the lungs continuous.    . pantoprazole (PROTONIX) 40 MG tablet Take 1 tablet (40 mg total) by mouth 2 (two) times daily. 60 tablet 6  . sucralfate (CARAFATE) 1 GM/10ML suspension Take 10 mLs (1 g total) by mouth 2 (two) times daily. 420 mL 0  . ezetimibe (ZETIA) 10 MG tablet Take 1 tablet (10 mg total) by mouth daily. 90 tablet 3  . HUMULIN N KWIKPEN 100 UNIT/ML Kiwkpen INJECT 16 UNITS EVERY MORNING AND 16 UNITS EVERY EVENING (Patient not taking: Reported on 03/28/2018) 15 mL 3   Facility-Administered Medications Prior to Visit  Medication Dose Route Frequency Provider Last Rate Last Dose  . sodium chloride flush (NS) 0.9 % injection 10 mL  10 mL Intravenous PRN Truitt Merle, MD   10 mL at 04/16/17 1459  . sodium chloride flush (NS) 0.9 % injection 10 mL  10 mL Intravenous  PRN Truitt Merle, MD   10 mL at 05/02/17 1725   EXAM: BP 112/70 (BP Location: Left Arm, Patient Position: Sitting, Cuff Size: Large)  Pulse 88   Temp 98.1 F (36.7 C) (Oral)   Resp 16   Ht '5\' 8"'  (1.727 m)   Wt 289 lb 6 oz (131.3 kg)   SpO2 96% Comment: 2L O2  BMI 44.00 kg/m  Gen: Alert, well appearing.  Patient is oriented to person, place, time, and situation. AFFECT: pleasant, lucid thought and speech. UEK:CMKL: no injection, icteris, swelling, or exudate.  EOMI, PERRLA. Mouth: lips without lesion/swelling.  Oral mucosa pink and moist. Oropharynx without erythema, exudate, or swelling.  +Palpebral conjunctival pallor. CV: very distant S1 and S2, no audible murmur or rub. LUNGS: CTA bilat, mildly diminished aeration on inspiration in both bases, R>L, with a hint of egophony in R base. Nonlabored resps.  ABD: soft, NT/ND EXT: trace to 1+ pitting edema bilat.  Brawny fibrotic skin changes in anterior tibial regions  Pertinent labs/imaging Lab Results  Component Value Date   WBC 5.3 03/21/2018   HGB 8.7 (L) 03/21/2018   HCT 27.9 (L) 03/21/2018   MCV 95.2 03/21/2018   PLT 103 (L) 03/21/2018     Chemistry      Component Value Date/Time   NA 137 03/19/2018 0700   NA 136 10/03/2017 1250   K 4.1 03/19/2018 0700   K 3.8 10/03/2017 1250   CL 95 (L) 03/19/2018 0700   CO2 32 03/19/2018 0700   CO2 29 10/03/2017 1250   BUN 52 (H) 03/19/2018 0700   BUN 13.9 10/03/2017 1250   CREATININE 1.08 03/19/2018 0700   CREATININE 1.19 02/10/2018 1017   CREATININE 1.2 10/03/2017 1250      Component Value Date/Time   CALCIUM 8.3 (L) 03/19/2018 0700   CALCIUM 9.3 10/03/2017 1250   ALKPHOS 53 03/18/2018 1315   ALKPHOS 68 10/03/2017 1250   AST 70 (H) 03/18/2018 1315   AST 67 (H) 02/10/2018 1017   AST 64 (H) 10/03/2017 1250   ALT 48 03/18/2018 1315   ALT 56 (H) 02/10/2018 1017   ALT 50 10/03/2017 1250   BILITOT 1.1 03/18/2018 1315   BILITOT 0.7 02/10/2018 1017   BILITOT 0.77 10/03/2017  1250     Lab Results  Component Value Date   LIPASE 22 03/18/2018    EGD 03/19/18: small gastric ulcers, small esoph ulcer--none bleeding. Bx's neg for any malignancy or Barrett's.  CT chest w/contrast 12/05/17: IMPRESSION: 1. Stable exam. No esophageal mass or mediastinal adenopathy identified. No evidence for metastatic disease to the chest, abdomen and pelvis. 2. Cirrhosis with ascites. 3. Persistent bilateral pleural effusions, right greater than left. 4. Aortic Atherosclerosis (ICD10-I70.0). Three vessel coronary artery atherosclerotic calcifications noted. 5. Thoracic and lumbar spondylosis and advanced right glenohumeral joint arthropathy. 6. Gallstones.  ASSESSMENT/PLAN:  1) Iron def anemia from upper GI bleed, acute. Gastric ulcers on EGD--suspected to be radiation therapy- induced. Transfused 4 U pRBCs in hosp, hb up to 8.7.   Pt feeling good now.  Recheck CBC and CMET today. Stay off ASA, avoid NSAIDs, continue carafate bid and protonix 31m bid. Given his cirrhosis noted on imaging for the last 2 yrs + elevated transaminases, will check HepBs ag and Hep C ab today as well.    An After Visit Summary was printed and given to the patient.  FOLLOW UP:  Keep appt set with me for 04/24/18  Signed:  PCrissie Sickles MD           03/28/2018

## 2018-03-28 NOTE — Patient Instructions (Signed)
Do not take any Aspirin. Take your pantoprazole (protonix) twice per day. Take your carafate twice a day.

## 2018-03-29 ENCOUNTER — Other Ambulatory Visit: Payer: Self-pay | Admitting: Family Medicine

## 2018-03-29 LAB — CBC WITH DIFFERENTIAL/PLATELET
BASOS ABS: 50 {cells}/uL (ref 0–200)
Basophils Relative: 1.2 %
EOS ABS: 139 {cells}/uL (ref 15–500)
EOS PCT: 3.3 %
HEMATOCRIT: 29.4 % — AB (ref 38.5–50.0)
HEMOGLOBIN: 9.5 g/dL — AB (ref 13.2–17.1)
LYMPHS ABS: 748 {cells}/uL — AB (ref 850–3900)
MCH: 29.5 pg (ref 27.0–33.0)
MCHC: 32.3 g/dL (ref 32.0–36.0)
MCV: 91.3 fL (ref 80.0–100.0)
MPV: 10 fL (ref 7.5–12.5)
Monocytes Relative: 16.6 %
NEUTROS ABS: 2566 {cells}/uL (ref 1500–7800)
Neutrophils Relative %: 61.1 %
Platelets: 145 10*3/uL (ref 140–400)
RBC: 3.22 10*6/uL — ABNORMAL LOW (ref 4.20–5.80)
RDW: 17.9 % — ABNORMAL HIGH (ref 11.0–15.0)
Total Lymphocyte: 17.8 %
WBC mixed population: 697 cells/uL (ref 200–950)
WBC: 4.2 10*3/uL (ref 3.8–10.8)

## 2018-03-29 LAB — COMPREHENSIVE METABOLIC PANEL
AG RATIO: 1.2 (calc) (ref 1.0–2.5)
ALT: 32 U/L (ref 9–46)
AST: 38 U/L — ABNORMAL HIGH (ref 10–35)
Albumin: 3.6 g/dL (ref 3.6–5.1)
Alkaline phosphatase (APISO): 70 U/L (ref 40–115)
BILIRUBIN TOTAL: 0.6 mg/dL (ref 0.2–1.2)
BUN: 12 mg/dL (ref 7–25)
CALCIUM: 8.9 mg/dL (ref 8.6–10.3)
CHLORIDE: 99 mmol/L (ref 98–110)
CO2: 27 mmol/L (ref 20–32)
Creat: 1.1 mg/dL (ref 0.70–1.18)
GLOBULIN: 3 g/dL (ref 1.9–3.7)
Glucose, Bld: 118 mg/dL — ABNORMAL HIGH (ref 65–99)
POTASSIUM: 4.4 mmol/L (ref 3.5–5.3)
Sodium: 138 mmol/L (ref 135–146)
Total Protein: 6.6 g/dL (ref 6.1–8.1)

## 2018-03-29 LAB — HEPATITIS C ANTIBODY
Hepatitis C Ab: NONREACTIVE
SIGNAL TO CUT-OFF: 0.08 (ref <1.00)

## 2018-03-29 LAB — HEPATITIS B SURFACE ANTIGEN: Hepatitis B Surface Ag: NONREACTIVE

## 2018-03-31 ENCOUNTER — Encounter (INDEPENDENT_AMBULATORY_CARE_PROVIDER_SITE_OTHER): Payer: Self-pay

## 2018-03-31 ENCOUNTER — Ambulatory Visit: Payer: Medicare Other | Admitting: Cardiology

## 2018-03-31 ENCOUNTER — Encounter: Payer: Self-pay | Admitting: Cardiology

## 2018-03-31 VITALS — BP 102/64 | HR 109 | Ht 68.0 in | Wt 288.0 lb

## 2018-03-31 DIAGNOSIS — I10 Essential (primary) hypertension: Secondary | ICD-10-CM

## 2018-03-31 DIAGNOSIS — I5032 Chronic diastolic (congestive) heart failure: Secondary | ICD-10-CM

## 2018-03-31 DIAGNOSIS — I2510 Atherosclerotic heart disease of native coronary artery without angina pectoris: Secondary | ICD-10-CM

## 2018-03-31 DIAGNOSIS — E785 Hyperlipidemia, unspecified: Secondary | ICD-10-CM | POA: Diagnosis not present

## 2018-03-31 NOTE — Progress Notes (Signed)
Cardiology Office Note:    Date:  03/31/2018   ID:  ALVAN CULPEPPER, DOB [birth year], MRN [id number]  PCP:  Tammi Sou, MD  Cardiologist:  No primary care provider on file.    Referring MD: Tammi Sou, MD   Chief Complaint  Patient presents with  . Coronary Artery Disease  . Hypertension  . Hyperlipidemia  . Congestive Heart Failure    History of Present Illness:    [name] is a 72 y.o. male with a hx of obesity hypoventilation syndrome on home O2, HTN, dyslipidemia, morbid obesity and chronic LE edema which is controlled with TED hose stockings and diuretic. He has chronic diastolic CHF controlled on diuretics. 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  Recent CT scan by oncology 08/2017 showed no esophageal mass; + hepatic cirrhosis but no compelling findings of hepatic metastatic disease, large right pleural effusion, small but increased amount of ascities and mesenteric edema, aortic atherosclerosos and coronary atherosclerosis.   He refused thoracentesis at that time.  He is DNR/DNI.                         He is here today for followup and is doing well.  He denies any chest pain or pressure, SOB, DOE, PND, orthopnea, LE edema, dizziness, palpitations or syncope. He is compliant with his meds and is tolerating meds with no SE.      Past Medical History:  Diagnosis Date  . Acute upper GI bleed 02/2018   Transfused 4 U pRBCs-->EGD showed an esoph ulcer and some gastric ulcers (?radiation-induced), path showed no malignancy or Barrett's.  . Asthma    as a child  . CAD in native artery    a. cardiac cath 09/2013 showed severely diseased mLAD and diagonal, mild LCx disease, moderate RCA disease, LVEDP 20,  EF 50%.  . Cataract    multiple types, bilateral  . Cholelithiasis without cholecystitis   . Chronic diastolic CHF (congestive heart failure) ([place]) 02/25/2009  . Chronic renal insufficiency, stage III (moderate) (HCC) 2017   Stage II/III (GFR around 60)  . 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  CT 11/2017 w/ascites and persistent bilat pleural effusions  . 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 COPD (chronic obstructive pulmonary disease) ([place])   . Coronary artery disease    chronically occluded LAD and diagonal with right to left collaterals, mild disease in the left circ and moderate disease in the mid RCA on medical management with Imdur, ASA, and Plavix.  . Dermatitis 05/2014  . DIABETES MELLITUS, TYPE II 06/24/2007   No diab retpthy as of 08/05/15 eye exam.  . 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 Pyrtle--EGD).  CT C/A/P showed metastatic adenopathy in mediastinum and upper abdomen 05/09/16.  Tx plan is palliative radiation (completed 06/22/16), then palliative systemic chemotherapy (carbo+taxol) was started but as of 08/03/16 onc f/u this was held due to severe knee and ankle arthralgias.  Restarting as of 10/2015 (08/2016 CT showed some disease regression  . Esophageal cancer ([place])    CT 12/2016 showed no residual esoph  mass--plan to continue chemo.  Ongoing palliative chemo with carboplatin and taxol q 2 wks as of 03/2017 onc f/u (CT 12/2016 and 04/2017 showed no progression of dz).  Taking a 2 mo break from chemo as of 05/2017.  Pleural effusion-- improved with lasix but onc wanted him to get dx/therap thoracentesis-pt declined.  CT C/A/P no progression/recurr/mets 11/2017.  Marland Kitchen Essential hypertension 05/01/2007   Qualifier: Diagnosis of  By: Tiney Rouge CMA, Ellison Hughs     . History of kidney stones   . Hyperkalemia 12/2015   Decreased ACE-I by 50% in response, then potassium normalized.  Marland Kitchen  HYPERLIPIDEMIA 06/24/2007  . Hypoxemia 01/27/2010  . Morbid obesity ([place])   . Myocardial infarction [facility])    pt states he was informed per MD that he has had one but pt was unaware   . Obesity hypoventilation syndrome (HCC)    oxygen 24/7 (2 liters [place] as of 02/2016)  . On home oxygen therapy    Oxygen @ 2l/m nasally 24/7 hours  . OSA (obstructive sleep apnea)    not tested; pt scored 4 per stop bang tool results sent to PCP   . OSTEOARTHRITIS 05/01/2007  . Pancytopenia due to chemotherapy ([place]) 2018  . Pleural effusion on right 08/2017   ? malignant vs CHF: pt refuses diagnostic thoracentesis b/c he states he feels fine, wants to try diuretic first.  . Pleural effusion on right 09/2017   Thoracentesis  . Presbycusis of both ears 05/2015   [facility]  . Pruritic condition 05/2014   Allergist summer 2015, no new testing.  Marland Kitchen RBBB   . Visual field defect 05/10/2016   Per Dr. Melina Fiddler, O.D.: Rt, loss of inf/temp quad and some loss sup/temp.  ?CVA  ? Pituitary tumor? 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    Past Surgical History:  Procedure Laterality Date  . APPENDECTOMY    . BALLOON DILATION N/A 05/04/2016   Procedure: BALLOON DILATION;  Surgeon: Jerene Bears, MD;  Location: [facility];  Service: Gastroenterology;  Laterality: N/A;  . BIOPSY  03/19/2018   Procedure: BIOPSY;  Surgeon: Jackquline Denmark, MD;  Location: [facility];  Service: Endoscopy;;  . CARDIAC CATHETERIZATION    . CARPAL TUNNEL RELEASE Left   . CATARACT EXTRACTION W/PHACO Right 04/29/2013   Procedure: CATARACT EXTRACTION PHACO AND INTRAOCULAR LENS PLACEMENT (IOC);  Surgeon: Adonis Brook, MD;  Location: [facility] [place];  Service: Ophthalmology;  Laterality: Right;  . CATARACT EXTRACTION, BILATERAL    . COLONOSCOPY W/ POLYPECTOMY  08/2011   Many polyps--all hyperplastic, severe diverticulosis, int hem.  [employer] hemoccult testing via [facility] 06/22/15 NEG  . ESOPHAGOGASTRODUODENOSCOPY N/A 05/04/2016    Procedure: ESOPHAGOGASTRODUODENOSCOPY (EGD);  Surgeon: Jerene Bears, MD;  Location: [facility];  Service: Gastroenterology;  Laterality: N/A;  . ESOPHAGOGASTRODUODENOSCOPY (EGD) WITH PROPOFOL N/A 03/19/2018   Gastric ulcers, esoph ulcer (radiation-induced?), bx-->path neg for malignancy or Barrett's. Procedure: ESOPHAGOGASTRODUODENOSCOPY (EGD) WITH PROPOFOL;  Surgeon: Jackquline Denmark, MD;  Location: [facility];  Service: Endoscopy;  Laterality: N/A;  . IR GENERIC HISTORICAL  10/03/2016   IR US GUIDE VASC ACCESS RIGHT 10/03/2016 Greggory Keen, MD [facility]  . IR GENERIC HISTORICAL  10/03/2016   IR FLUORO GUIDE PORT INSERTION RIGHT 10/03/2016 Greggory Keen, MD [facility]  . KNEE ARTHROSCOPY WITH MEDIAL MENISECTOMY Right 12/16/2014   Procedure: RIGHT KNEE ARTHROSCOPY WITH MEDIAL MENISECTOMY microfracture medial femoral condyle abrasion condroplasty medial femoral condyle lateral menisectomy;  Surgeon: Tobi Bastos,  MD;  Location: [facility];  Service: Orthopedics;  Laterality: Right;  . LEFT HEART CATHETERIZATION WITH CORONARY ANGIOGRAM N/A 10/26/2013   Procedure: LEFT HEART CATHETERIZATION WITH CORONARY ANGIOGRAM;  Surgeon: Jettie Booze, MD;  Location: [facility] CATH LAB;  Service: Cardiovascular;  Laterality: N/A;  . LUMBAR [place] SURGERY  2001  . SHOULDER SURGERY Right    right fx  . TONSILLECTOMY    . TRANSTHORACIC ECHOCARDIOGRAM  03/2016   EF 55-60%, grade I DD, LAE  . WRIST SURGERY Right    right fx    Current Medications: Current Meds  Medication Sig  . atorvastatin (LIPITOR) 80 MG tablet TAKE 1 TABLET BY MOUTH ONCE DAILY  . baclofen (LIORESAL) 10 MG tablet 1/2 - 1 tab po q8h prn muscle spasms  . beta carotene w/minerals (OCUVITE) tablet Take 1 tablet by mouth daily.  . cetirizine (ZYRTEC) 10 MG tablet Take 10 mg by mouth daily.  . citalopram (CELEXA) 20 MG tablet TAKE 1 TABLET BY MOUTH ONCE DAILY  . clonazePAM (KLONOPIN) 1 MG tablet TAKE 1 TABLET BY MOUTH TWICE DAILY AS NEEDED FOR   ANXIETY  . clotrimazole-betamethasone (LOTRISONE) cream Apply to affected area twice daily  . cromolyn (OPTICROM) 4 % ophthalmic solution Place 1 drop into both eyes. 3 to 4 times daily  . furosemide (LASIX) 20 MG tablet Take 1 tablet (20 mg total) by mouth daily.  Marland Kitchen gabapentin (NEURONTIN) 300 MG capsule TAKE ONE CAPSULE BY MOUTH THREE TIMES DAILY  . hydrOXYzine (ATARAX/VISTARIL) 25 MG tablet TAKE 1 TABLET BY MOUTH WITH SUPPER AND 1 AT BEDTIME FOR INSOMNIA  . Insulin Lispro (HUMALOG KWIKPEN) 200 UNIT/ML SOPN Inject 20 Units into the skin 2 (two) times daily with a meal.  . Insulin NPH, Human,, Isophane, (HUMULIN N) 100 UNIT/ML Kiwkpen 16 units every morning and 16 units every evening  . magnesium chloride (SLOW-MAG) 64 MG TBEC SR tablet Take 1 tablet (64 mg total) by mouth daily.  . metFORMIN (GLUCOPHAGE) 1000 MG tablet TAKE 1 TABLET BY MOUTH TWICE DAILY WITH MEALS  . Omega-3 Fatty Acids ([employer] FISH OIL) 1000 MG CAPS Take 2 tablets by mouth 2 (two) times daily.  . ONE TOUCH ULTRA TEST test strip USE  STRIP TO CHECK GLUCOSE THREE TIMES DAILY AS  DIRECTED  . ONETOUCH DELICA LANCETS 88T MISC USE ONE  TO CHECK GLUCOSE THREE TIMES DAILY  . OXYGEN Inhale 2 L/min into the lungs continuous.  . pantoprazole (PROTONIX) 40 MG tablet Take 1 tablet (40 mg total) by mouth 2 (two) times daily.  . sucralfate (CARAFATE) 1 GM/10ML suspension Take 10 mLs (1 g total) by mouth 2 (two) times daily.     Allergies:   Patient has no known allergies.   Social History   Socioeconomic History  . Marital status: Married    Spouse name: susan  . Number of children: 2  . Years of education: Not on file  . Highest education level: Not on file  Occupational History  . Occupation: Retired  Scientific laboratory technician  . Financial resource strain: Not on file  . Food insecurity:    Worry: Not on file    Inability: Not on file  . Transportation needs:    Medical: Not on file    Non-medical: Not on file  Tobacco Use  . Smoking  status: Former Smoker    Packs/day: 1.50    Years: 30.00    Pack years: 45.00    Types: Cigarettes, Pipe, Cigars    Last attempt  to quit: 10/30/1983    Years since quitting: 34.4  . Smokeless tobacco: Current User    Types: Chew    Last attempt to quit: 05/15/2016  Substance and Sexual Activity  . Alcohol use: No  . Drug use: No  . Sexual activity: Not Currently  Lifestyle  . Physical activity:    Days per week: Not on file    Minutes per session: Not on file  . Stress: Not on file  Relationships  . Social connections:    Talks on phone: Not on file    Gets together: Not on file    Attends religious service: Not on file    Active member of club or organization: Not on file    Attends meetings of clubs or organizations: Not on file    Relationship status: Not on file  Other Topics Concern  . Not on file  Social History Narrative   Married, one son and one daughter.   His daughter and her two children live with him.   Coffee daily.  Former smoker.  No alcohol.   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   Attends church weekly-   Oxygen continuous     Family History: The patient's family history includes Alcohol abuse in his father; Aneurysm in his father; Diabetes in his maternal aunt; Heart attack in his mother. There is no history of Colon cancer.  ROS:   Please see the history of present illness.    ROS  All other systems reviewed and negative.   EKGs/Labs/Other Studies Reviewed:    The following studies were reviewed today: none  EKG:  EKG is not ordered today.    Recent Labs: 03/28/2018: ALT 32; BUN 12; Creat 1.10; Hemoglobin 9.5; Platelets 145; Potassium 4.4; Sodium 138   Recent Lipid Panel    Component Value Date/Time   CHOL 96 (L) 11/28/2016 1113   TRIG 168 (H) 11/28/2016 1113   TRIG 268 (HH) 09/06/2006 1555   HDL 37 (L) 11/28/2016 1113   CHOLHDL 2.6 11/28/2016 1113   CHOLHDL 3.6 03/12/2016 1001   VLDL 37 (H) 03/12/2016  1001   LDLCALC 25 11/28/2016 1113   LDLDIRECT 74.0 04/15/2015 0838    Physical Exam:    VS:  BP 102/64   Pulse (!) 109   Ht _0  (1.727 m)   Wt 288 lb (130.6 kg)   SpO2 94%   BMI 43.79 kg/m     Wt Readings from Last 3 Encounters:  03/31/18 288 lb (130.6 kg)  03/28/18 289 lb 6 oz (131.3 kg)  03/19/18 272 lb (123.4 kg)     GEN:  Well nourished, well developed in no acute distress HEENT: Normal NECK: No JVD; No carotid bruits LYMPHATICS: No lymphadenopathy CARDIAC: RRR, no murmurs, rubs, gallops RESPIRATORY:  Clear to auscultation without rales, wheezing or rhonchi  ABDOMEN: Soft, non-tender, non-distended MUSCULOSKELETAL:  No edema; No deformity  SKIN: Warm and dry NEUROLOGIC:  Alert and oriented x 3 PSYCHIATRIC:  Normal affect   ASSESSMENT:    1. Atherosclerosis of native coronary artery of native heart without angina pectoris   2. Chronic diastolic CHF (congestive heart failure) ([place])   3. Essential hypertension   4. Dyslipidemia    PLAN:    In order of problems listed above:  1.  ASCAD -  severely diseased and chronically occluded LAD and diagonal with right to left collaterals, mild disease in the left circ and moderate disease in the  mid RCA on medical management.  He has not had any anginal symptoms.  He will continue on atorvastatin.  He is not on aspirin therapy due to problems with anemia in the past.  His last hemoglobin was 8.7 on 03/21/2018.  2.  Chronic diastolic CHF -his weight is stable and actually down 5 pounds from April.  He does not appear to be volume overloaded on exam today.  He have chronic lower extremity edema which is controlled on Lasix 20 mg daily and compression hose.  Creatinine was stable at 1.08 on 03/19/2018.  3.  HTN - BP is well controlled on exam today.  He not on any antihypertensive medications at this time.  4.  Hyperlipidemia with LDL goal < 70.  LDL was 25 on 11/28/2016.  He will continue on atorvastatin 80 mg daily Zetia 10 mg  daily and I will repeat an FLP.  His ALT was normal at 48 on 03/18/2018.   Medication Adjustments/Labs and Tests Ordered: Current medicines are reviewed at length with the patient today.  Concerns regarding medicines are outlined above.  No orders of the defined types were placed in this encounter.  No orders of the defined types were placed in this encounter.   Signed, Fransico Him, MD  03/31/2018 3:08 PM    [facility]

## 2018-03-31 NOTE — Patient Instructions (Signed)
Medication Instructions:  Your physician recommends that you continue on your current medications as directed. Please refer to the Current Medication list given to you today.  If you need a refill on your cardiac medications, please contact your pharmacy first.  Labwork: Your physician recommends that you return for lab work in: 1-2 weeks for fasting lipid panel   Testing/Procedures: None ordered   Follow-Up: Your physician wants you to follow-up in: 6 month with Dr. Radford Pax. You will receive a reminder letter in the mail two months in advance. If you don't receive a letter, please call our office to schedule the follow-up appointment.  Any Other Special Instructions Will Be Listed Below (If Applicable).   Thank you for choosing [place], RN  [phone]  If you need a refill on your cardiac medications before your next appointment, please call your pharmacy.

## 2018-04-03 ENCOUNTER — Encounter: Payer: Self-pay | Admitting: Gastroenterology

## 2018-04-03 ENCOUNTER — Ambulatory Visit: Payer: Medicare Other | Admitting: Gastroenterology

## 2018-04-03 VITALS — BP 120/64 | HR 96 | Ht 68.0 in | Wt 286.1 lb

## 2018-04-03 DIAGNOSIS — Z794 Long term (current) use of insulin: Secondary | ICD-10-CM

## 2018-04-03 DIAGNOSIS — E119 Type 2 diabetes mellitus without complications: Secondary | ICD-10-CM

## 2018-04-03 DIAGNOSIS — Z9981 Dependence on supplemental oxygen: Secondary | ICD-10-CM | POA: Diagnosis not present

## 2018-04-03 DIAGNOSIS — K2211 Ulcer of esophagus with bleeding: Secondary | ICD-10-CM | POA: Insufficient documentation

## 2018-04-03 DIAGNOSIS — IMO0001 Reserved for inherently not codable concepts without codable children: Secondary | ICD-10-CM

## 2018-04-03 NOTE — H&P (View-Only) (Signed)
04/03/2018 [name] [id number] April 21, 1946   HISTORY OF PRESENT ILLNESS:  This is a 72 year old male who is a patient of Dr. Vena Rua.  He was recently hospitalized from 5/21-5/25 for UGI bleed.  Found to have an esophageal ulcer on EGD on 5/22 that was non-bleeding at that time.  Biopsies showed chronic inflammation and focal ulceration with no sign of malignancy.  Has history of esophageal squamous cell carcinoma diagnosed in 04/2016 with palliative chemo and radiation.  Received 4 units PRBC's during his stay.  Was placed on carafate suspension BID, which he is still taking.  Is also on pantoprazole 40 mg BID.   No further black stools or sign of bleeding.  Saw PCP last week and Hgb was up to 9.5 grams from 8.7 grams at discharge.  Is on O2 continuously for COPD.  Past Medical History:  Diagnosis Date  . Acute upper GI bleed 02/2018   Transfused 4 U pRBCs-->EGD showed an esoph ulcer and some gastric ulcers (?radiation-induced), path showed no malignancy or Barrett's.  . Asthma    as a child  . CAD in native artery    a. cardiac cath 09/2013 showed severely diseased mLAD and diagonal, mild LCx disease, moderate RCA disease, LVEDP 20, EF 50%.  . Cataract    multiple types, bilateral  . Cholelithiasis without cholecystitis   . Chronic diastolic CHF (congestive heart failure) ([place]) 02/25/2009  . Chronic renal insufficiency, stage III (moderate) (H[facility]) 2017   Stage II/III (GFR around 60)  . 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  CT 11/2017 w/ascites and persistent bilat pleural effusions  . 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 COPD (chronic obstructive pulmonary disease) ([place])   . Coronary artery disease    chronically occluded LAD and diagonal with right to left collaterals, mild disease in the left circ and moderate disease in the mid RCA on medical management with Imdur, ASA, and Plavix.  .  Dermatitis 05/2014  . DIABETES MELLITUS, TYPE II 06/24/2007   No diab retpthy as of 08/05/15 eye exam.  . Esophageal cancer ([place]) 04/2016   poorly differentiated carcinoma (Dr. Pyrtle--EGD).  CT C/A/P showed metastatic adenopathy in mediastinum and upper abdomen 05/09/16.  Tx plan is palliative radiation (completed 06/22/16), then palliative systemic chemotherapy (carbo+taxol) was started but as of 08/03/16 onc f/u this was held due to severe knee and ankle arthralgias.  Restarting as of 10/2015 (08/2016 CT showed some disease regression  . Esophageal cancer ([place])    CT 12/2016 showed no residual esoph mass--plan to continue chemo.  Ongoing palliative chemo with carboplatin and taxol q 2 wks as of 03/2017 onc f/u (CT 12/2016 and 04/2017 showed no progression of dz).  Taking a 2 mo break from chemo as of 05/2017.  Pleural effusion-- improved with lasix but onc wanted him to get dx/therap thoracentesis-pt declined.  CT C/A/P no progression/recurr/mets 11/2017.  Marland Kitchen Essential hypertension 05/01/2007   Qualifier: Diagnosis of  By: Tiney Rouge CMA, Ellison Hughs     . History of kidney stones   . Hyperkalemia 12/2015   Decreased ACE-I by 50% in response, then potassium normalized.  Marland Kitchen HYPERLIPIDEMIA 06/24/2007  . Hypoxemia 01/27/2010  . Morbid obesity ([place])   . Myocardial infarction [facility])    pt states he was informed per MD that he has had one but pt was unaware   . Obesity hypoventilation syndrome ([place])  oxygen 24/7 (2 liters [place] as of 02/2016)  . On home oxygen therapy    Oxygen @ 2l/m nasally 24/7 hours  . OSA (obstructive sleep apnea)    not tested; pt scored 4 per stop bang tool results sent to PCP   . OSTEOARTHRITIS 05/01/2007  . Pancytopenia due to chemotherapy ([place]) 2018  . Pleural effusion on right 08/2017   ? malignant vs CHF: pt refuses diagnostic thoracentesis b/c he states he feels fine, wants to try diuretic first.  . Pleural effusion on right 09/2017   Thoracentesis  . Presbycusis of both ears 05/2015   [facility]  . Pruritic condition 05/2014   Allergist summer 2015, no new testing.  Marland Kitchen RBBB   . Visual field defect 05/10/2016   Per Dr. Melina Fiddler, O.D.: Rt, loss of inf/temp quad and some loss sup/temp.  ?CVA  ? Pituitary tumor? 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   Past Surgical History:  Procedure Laterality Date  . APPENDECTOMY    . BALLOON DILATION N/A 05/04/2016   Procedure: BALLOON DILATION;  Surgeon: Jerene Bears, MD;  Location: [facility];  Service: Gastroenterology;  Laterality: N/A;  . BIOPSY  03/19/2018   Procedure: BIOPSY;  Surgeon: Jackquline Denmark, MD;  Location: [facility];  Service: Endoscopy;;  . CARDIAC CATHETERIZATION    . CARPAL TUNNEL RELEASE Left   . CATARACT EXTRACTION W/PHACO Right 04/29/2013   Procedure: CATARACT EXTRACTION PHACO AND INTRAOCULAR LENS PLACEMENT (IOC);  Surgeon: Adonis Brook, MD;  Location: [facility];  Service: Ophthalmology;  Laterality: Right;  . CATARACT EXTRACTION, BILATERAL    . COLONOSCOPY W/ POLYPECTOMY  08/2011   Many polyps--all hyperplastic, severe diverticulosis, int hem.  BioIQ hemoccult testing via [facility] 06/22/15 NEG  . ESOPHAGOGASTRODUODENOSCOPY N/A 05/04/2016   Procedure: ESOPHAGOGASTRODUODENOSCOPY (EGD);  Surgeon: Jerene Bears, MD;  Location: [facility];  Service: Gastroenterology;  Laterality: N/A;  . ESOPHAGOGASTRODUODENOSCOPY (EGD) WITH PROPOFOL N/A 03/19/2018   Gastric ulcers, esoph ulcer (radiation-induced?), bx-->path neg for malignancy or Barrett's. Procedure: ESOPHAGOGASTRODUODENOSCOPY (EGD) WITH PROPOFOL;  Surgeon: Jackquline Denmark, MD;  Location: [facility];  Service: Endoscopy;  Laterality: N/A;  . IR GENERIC HISTORICAL  10/03/2016   IR US GUIDE VASC ACCESS RIGHT 10/03/2016 Greggory Keen, MD [facility]  . IR GENERIC HISTORICAL  10/03/2016   IR FLUORO GUIDE PORT INSERTION RIGHT 10/03/2016 Greggory Keen, MD [facility]  . KNEE ARTHROSCOPY WITH MEDIAL MENISECTOMY Right 12/16/2014   Procedure: RIGHT  KNEE ARTHROSCOPY WITH MEDIAL MENISECTOMY microfracture medial femoral condyle abrasion condroplasty medial femoral condyle lateral menisectomy;  Surgeon: Tobi Bastos, MD;  Location: [facility];  Service: Orthopedics;  Laterality: Right;  . LEFT HEART CATHETERIZATION WITH CORONARY ANGIOGRAM N/A 10/26/2013   Procedure: LEFT HEART CATHETERIZATION WITH CORONARY ANGIOGRAM;  Surgeon: Jettie Booze, MD;  Location: [facility]ATH [facility];  Service: Cardiovascular;  Laterality: N/A;  . LUMBAR [place] SURGERY  2001  . SHOULDER SURGERY Right    right fx  . TONSILLECTOMY    . TRANSTHORACIC ECHOCARDIOGRAM  03/2016   EF 55-60%, grade I DD, LAE  . WRIST SURGERY Right    right fx    reports that he quit smoking about 34 years ago. His smoking use included cigarettes, pipe, and cigars. He has a 45.00 pack-year smoking history. His smokeless tobacco use includes chew. He reports that he does not drink alcohol or use drugs. family history includes Alcohol abuse in his father; Aneurysm in his father; Diabetes  in his maternal aunt; Heart attack in his mother. No Known Allergies    Outpatient Encounter Medications as of 04/03/2018  Medication Sig  . atorvastatin (LIPITOR) 80 MG tablet TAKE 1 TABLET BY MOUTH ONCE DAILY  . baclofen (LIORESAL) 10 MG tablet 1/2 - 1 tab po q8h prn muscle spasms  . beta carotene w/minerals (OCUVITE) tablet Take 1 tablet by mouth daily.  . cetirizine (ZYRTEC) 10 MG tablet Take 10 mg by mouth daily.  . citalopram (CELEXA) 20 MG tablet TAKE 1 TABLET BY MOUTH ONCE DAILY  . clonazePAM (KLONOPIN) 1 MG tablet TAKE 1 TABLET BY MOUTH TWICE DAILY AS NEEDED FOR  ANXIETY  . clotrimazole-betamethasone (LOTRISONE) cream Apply to affected area twice daily  . cromolyn (OPTICROM) 4 % ophthalmic solution Place 1 drop into both eyes. 3 to 4 times daily  . furosemide (LASIX) 20 MG tablet Take 1 tablet (20 mg total) by mouth daily.  Marland Kitchen gabapentin (NEURONTIN) 300 MG capsule TAKE ONE CAPSULE BY MOUTH THREE TIMES  DAILY  . hydrOXYzine (ATARAX/VISTARIL) 25 MG tablet TAKE 1 TABLET BY MOUTH WITH SUPPER AND 1 AT BEDTIME FOR INSOMNIA  . Insulin Lispro (HUMALOG KWIKPEN) 200 UNIT/ML SOPN Inject 20 Units into the skin 2 (two) times daily with a meal.  . Insulin NPH, Human,, Isophane, (HUMULIN N) 100 UNIT/ML Kiwkpen 16 units every morning and 16 units every evening  . magnesium chloride (SLOW-MAG) 64 MG TBEC SR tablet Take 1 tablet (64 mg total) by mouth daily.  . metFORMIN (GLUCOPHAGE) 1000 MG tablet TAKE 1 TABLET BY MOUTH TWICE DAILY WITH MEALS  . Omega-3 Fatty Acids ([employer] FISH OIL) 1000 MG CAPS Take 2 tablets by mouth 2 (two) times daily.  . ONE TOUCH ULTRA TEST test strip USE  STRIP TO CHECK GLUCOSE THREE TIMES DAILY AS  DIRECTED  . ONETOUCH DELICA LANCETS 20N MISC USE ONE  TO CHECK GLUCOSE THREE TIMES DAILY  . OXYGEN Inhale 2 L/min into the lungs continuous.  . pantoprazole (PROTONIX) 40 MG tablet Take 1 tablet (40 mg total) by mouth 2 (two) times daily.  . sucralfate (CARAFATE) 1 GM/10ML suspension Take 10 mLs (1 g total) by mouth 2 (two) times daily.  Marland Kitchen ezetimibe (ZETIA) 10 MG tablet Take 1 tablet (10 mg total) by mouth daily.   Facility-Administered Encounter Medications as of 04/03/2018  Medication  . sodium chloride flush (NS) 0.9 % injection 10 mL  . sodium chloride flush (NS) 0.9 % injection 10 mL     REVIEW OF SYSTEMS  : All other systems reviewed and negative except where noted in the History of Present Illness.   PHYSICAL EXAM: BP 120/64 (BP Location: Left Arm, Patient Position: Sitting, Cuff Size: Normal)   Pulse 96   Ht _0  (1.727 m)   Wt 286 lb 2 oz (129.8 kg)   BMI 43.51 kg/m  General: Well developed white male in no acute distress Head: Normocephalic and atraumatic Eyes:  Sclerae anicteric, conjunctiva pink. Ears: Normal auditory acuity Lungs: Clear throughout to auscultation; no increased WOB. Heart: Regular rate and rhythm; no M/R/G. Abdomen: Soft, non-distended.  BS present.   Non-tender. Musculoskeletal: Symmetrical with no gross deformities  Skin: No lesions on visible extremities Extremities: No edema  Neurological: Alert oriented x 4, grossly non-focal Psychological:  Alert and cooperative. Normal mood and affect  ASSESSMENT AND PLAN: *Esophageal ulcer seen on EGD 5/22.  ? Radiation induced.  Biopsies showed chronic inflammation and focal ulceration.  *Blood loss anemia secondary to above:  Received 4 units PRBC's during hospital stay.  Most recent Hgb one week ago was improving.   *History of esophageal squamous cell carcinoma diagnosed 04/2016.  Had palliative radiation and chemo. *COPD, OSA:  On home O2. *IDDM:  Insulin will be adjusted prior to endoscopic procedure per protocol. Will resume normal dosing after procedure.  **Needs repeat EGD in 6-8 weeks from last.  Will schedule with Dr. Hilarie Fredrickson at [facility].  The risks, benefits, and alternatives to EGD were discussed with the patient and he consents to proceed. **Will continue pantoprazole 40 mg BID and carafate suspension BID for now.   CC:  McGowen, Adrian Blackwater, MD   Addendum: Reviewed and agree with initial management. Pyrtle, Lajuan Lines, MD

## 2018-04-03 NOTE — Progress Notes (Addendum)
04/03/2018 [name] [id number] April 21, 1946   HISTORY OF PRESENT ILLNESS:  This is a 72 year old male who is a patient of Dr. Vena Rua.  He was recently hospitalized from 5/21-5/25 for UGI bleed.  Found to have an esophageal ulcer on EGD on 5/22 that was non-bleeding at that time.  Biopsies showed chronic inflammation and focal ulceration with no sign of malignancy.  Has history of esophageal squamous cell carcinoma diagnosed in 04/2016 with palliative chemo and radiation.  Received 4 units PRBC's during his stay.  Was placed on carafate suspension BID, which he is still taking.  Is also on pantoprazole 40 mg BID.   No further black stools or sign of bleeding.  Saw PCP last week and Hgb was up to 9.5 grams from 8.7 grams at discharge.  Is on O2 continuously for COPD.  Past Medical History:  Diagnosis Date  . Acute upper GI bleed 02/2018   Transfused 4 U pRBCs-->EGD showed an esoph ulcer and some gastric ulcers (?radiation-induced), path showed no malignancy or Barrett's.  . Asthma    as a child  . CAD in native artery    a. cardiac cath 09/2013 showed severely diseased mLAD and diagonal, mild LCx disease, moderate RCA disease, LVEDP 20, EF 50%.  . Cataract    multiple types, bilateral  . Cholelithiasis without cholecystitis   . Chronic diastolic CHF (congestive heart failure) ([place]) 02/25/2009  . Chronic renal insufficiency, stage III (moderate) (HCC) 2017   Stage II/III (GFR around 60)  . 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  CT 11/2017 w/ascites and persistent bilat pleural effusions  . 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 COPD (chronic obstructive pulmonary disease) ([place])   . Coronary artery disease    chronically occluded LAD and diagonal with right to left collaterals, mild disease in the left circ and moderate disease in the mid RCA on medical management with Imdur, ASA, and Plavix.  .  Dermatitis 05/2014  . DIABETES MELLITUS, TYPE II 06/24/2007   No diab retpthy as of 08/05/15 eye exam.  . Esophageal cancer ([place]) 04/2016   poorly differentiated carcinoma (Dr. Pyrtle--EGD).  CT C/A/P showed metastatic adenopathy in mediastinum and upper abdomen 05/09/16.  Tx plan is palliative radiation (completed 06/22/16), then palliative systemic chemotherapy (carbo+taxol) was started but as of 08/03/16 onc f/u this was held due to severe knee and ankle arthralgias.  Restarting as of 10/2015 (08/2016 CT showed some disease regression  . Esophageal cancer ([place])    CT 12/2016 showed no residual esoph mass--plan to continue chemo.  Ongoing palliative chemo with carboplatin and taxol q 2 wks as of 03/2017 onc f/u (CT 12/2016 and 04/2017 showed no progression of dz).  Taking a 2 mo break from chemo as of 05/2017.  Pleural effusion-- improved with lasix but onc wanted him to get dx/therap thoracentesis-pt declined.  CT C/A/P no progression/recurr/mets 11/2017.  Marland Kitchen Essential hypertension 05/01/2007   Qualifier: Diagnosis of  By: Tiney Rouge CMA, Ellison Hughs     . History of kidney stones   . Hyperkalemia 12/2015   Decreased ACE-I by 50% in response, then potassium normalized.  Marland Kitchen HYPERLIPIDEMIA 06/24/2007  . Hypoxemia 01/27/2010  . Morbid obesity ([place])   . Myocardial infarction [facility])    pt states he was informed per MD that he has had one but pt was unaware   . Obesity hypoventilation syndrome ([place])  oxygen 24/7 (2 liters [place] as of 02/2016)  . On home oxygen therapy    Oxygen @ 2l/m nasally 24/7 hours  . OSA (obstructive sleep apnea)    not tested; pt scored 4 per stop bang tool results sent to PCP   . OSTEOARTHRITIS 05/01/2007  . Pancytopenia due to chemotherapy ([place]) 2018  . Pleural effusion on right 08/2017   ? malignant vs CHF: pt refuses diagnostic thoracentesis b/c he states he feels fine, wants to try diuretic first.  . Pleural effusion on right 09/2017   Thoracentesis  . Presbycusis of both ears 05/2015   [facility]  . Pruritic condition 05/2014   Allergist summer 2015, no new testing.  Marland Kitchen RBBB   . Visual field defect 05/10/2016   Per Dr. Melina Fiddler, O.D.: Rt, loss of inf/temp quad and some loss sup/temp.  ?CVA  ? Pituitary tumor? 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   Past Surgical History:  Procedure Laterality Date  . APPENDECTOMY    . BALLOON DILATION N/A 05/04/2016   Procedure: BALLOON DILATION;  Surgeon: Jerene Bears, MD;  [facility]: [facility];  Service: Gastroenterology;  Laterality: N/A;  . BIOPSY  03/19/2018   Procedure: BIOPSY;  Surgeon: Jackquline Denmark, MD;  Location: [facility];  Service: Endoscopy;;  . CARDIAC CATHETERIZATION    . CARPAL TUNNEL RELEASE Left   . CATARACT EXTRACTION W/PHACO Right 04/29/2013   Procedure: CATARACT EXTRACTION PHACO AND INTRAOCULAR LENS PLACEMENT (IOC);  Surgeon: Adonis Brook, MD;  Location: [facility];  Service: Ophthalmology;  Laterality: Right;  . CATARACT EXTRACTION, BILATERAL    . COLONOSCOPY W/ POLYPECTOMY  08/2011   Many polyps--all hyperplastic, severe diverticulosis, int hem.  [employer] hemoccult testing via [facility] 06/22/15 NEG  . ESOPHAGOGASTRODUODENOSCOPY N/A 05/04/2016   Procedure: ESOPHAGOGASTRODUODENOSCOPY (EGD);  Surgeon: Jerene Bears, MD;  Location: [facility];  Service: Gastroenterology;  Laterality: N/A;  . ESOPHAGOGASTRODUODENOSCOPY (EGD) WITH PROPOFOL N/A 03/19/2018   Gastric ulcers, esoph ulcer (radiation-induced?), bx-->path neg for malignancy or Barrett's. Procedure: ESOPHAGOGASTRODUODENOSCOPY (EGD) WITH PROPOFOL;  Surgeon: Jackquline Denmark, MD;  [facility]: [facility];  Service: Endoscopy;  Laterality: N/A;  . IR GENERIC HISTORICAL  10/03/2016   IR US GUIDE VASC ACCESS RIGHT 10/03/2016 Gregg[facility] Keen, MD [facility]  . IR GENERIC HISTORICAL  10/03/2016   IR FLUORO GUIDE PORT INSERTION RIGHT 10/03/2016 Gre[facility] Keen, MD [facility]  . KNEE ARTHROSCOPY WITH MEDIAL MENISECTOMY Right 12/16/2014   Procedure: RIGHT  KNEE ARTHROSCOPY WITH MEDIAL MENISECTOMY microfracture medial femoral condyle abrasion condroplasty medial femoral condyle lateral menisectomy;  Surgeon: Tobi Bastos, MD;  Location: [facility];  Service: Orthopedics;  Laterality: Right;  . LEFT HEART CATHETERIZATION WITH CORONARY ANGIOGRAM N/A 10/26/2013   Procedure: LEFT HEART CATHETERIZATION WITH CORONARY ANGIOGRAM;  Surgeon: Jettie Booze, MD;  Location: [facility];  Service: Cardiovascular;  Laterality: N/A;  . LUMBAR [place] SURGERY  2001  . SHOULDER SURGERY Right    right fx  . TONSILLECTOMY    . TRANSTHORACIC ECHOCARDIOGRAM  03/2016   EF 55-60%, grade I DD, LAE  . WRIST SURGERY Right    right fx    reports that he quit smoking about 34 years ago. His smoking use included cigarettes, pipe, and cigars. He has a 45.00 pack-year smoking history. His smokeless tobacco use includes chew. He reports that he does not drink alcohol or use drugs. family history includes Alcohol abuse in his father; Aneurysm in his father; Diabetes  in his maternal aunt; Heart attack in his mother. No Known Allergies    Outpatient Encounter Medications as of 04/03/2018  Medication Sig  . atorvastatin (LIPITOR) 80 MG tablet TAKE 1 TABLET BY MOUTH ONCE DAILY  . baclofen (LIORESAL) 10 MG tablet 1/2 - 1 tab po q8h prn muscle spasms  . beta carotene w/minerals (OCUVITE) tablet Take 1 tablet by mouth daily.  . cetirizine (ZYRTEC) 10 MG tablet Take 10 mg by mouth daily.  . citalopram (CELEXA) 20 MG tablet TAKE 1 TABLET BY MOUTH ONCE DAILY  . clonazePAM (KLONOPIN) 1 MG tablet TAKE 1 TABLET BY MOUTH TWICE DAILY AS NEEDED FOR  ANXIETY  . clotrimazole-betamethasone (LOTRISONE) cream Apply to affected area twice daily  . cromolyn (OPTICROM) 4 % ophthalmic solution Place 1 drop into both eyes. 3 to 4 times daily  . furosemide (LASIX) 20 MG tablet Take 1 tablet (20 mg total) by mouth daily.  Marland Kitchen gabapentin (NEURONTIN) 300 MG capsule TAKE ONE CAPSULE BY MOUTH THREE TIMES  DAILY  . hydrOXYzine (ATARAX/VISTARIL) 25 MG tablet TAKE 1 TABLET BY MOUTH WITH SUPPER AND 1 AT BEDTIME FOR INSOMNIA  . Insulin Lispro (HUMALOG KWIKPEN) 200 UNIT/ML SOPN Inject 20 Units into the skin 2 (two) times daily with a meal.  . Insulin NPH, Human,, Isophane, (HUMULIN N) 100 UNIT/ML Kiwkpen 16 units every morning and 16 units every evening  . magnesium chloride (SLOW-MAG) 64 MG TBEC SR tablet Take 1 tablet (64 mg total) by mouth daily.  . metFORMIN (GLUCOPHAGE) 1000 MG tablet TAKE 1 TABLET BY MOUTH TWICE DAILY WITH MEALS  . Omega-3 Fatty Acids ([employer] FISH OIL) 1000 MG CAPS Take 2 tablets by mouth 2 (two) times daily.  . ONE TOUCH ULTRA TEST test strip USE  STRIP TO CHECK GLUCOSE THREE TIMES DAILY AS  DIRECTED  . ONETOUCH DELICA LANCETS 20N MISC USE ONE  TO CHECK GLUCOSE THREE TIMES DAILY  . OXYGEN Inhale 2 L/min into the lungs continuous.  . pantoprazole (PROTONIX) 40 MG tablet Take 1 tablet (40 mg total) by mouth 2 (two) times daily.  . sucralfate (CARAFATE) 1 GM/10ML suspension Take 10 mLs (1 g total) by mouth 2 (two) times daily.  Marland Kitchen ezetimibe (ZETIA) 10 MG tablet Take 1 tablet (10 mg total) by mouth daily.   Facility-Administered Encounter Medications as of 04/03/2018  Medication  . sodium chloride flush (NS) 0.9 % injection 10 mL  . sodium chloride flush (NS) 0.9 % injection 10 mL     REVIEW OF SYSTEMS  : All other systems reviewed and negative except where noted in the History of Present Illness.   PHYSICAL EXAM: BP 120/64 (BP Location: Left Arm, Patient Position: Sitting, Cuff Size: Normal)   Pulse 96   Ht _0  (1.727 m)   Wt 286 lb 2 oz (129.8 kg)   BMI 43.51 kg/m  General: Well developed white male in no acute distress Head: Normocephalic and atraumatic Eyes:  Sclerae anicteric, conjunctiva pink. Ears: Normal auditory acuity Lungs: Clear throughout to auscultation; no increased WOB. Heart: Regular rate and rhythm; no M/R/G. Abdomen: Soft, non-distended.  BS present.   Non-tender. Musculoskeletal: Symmetrical with no gross deformities  Skin: No lesions on visible extremities Extremities: No edema  Neurological: Alert oriented x 4, grossly non-focal Psychological:  Alert and cooperative. Normal mood and affect  ASSESSMENT AND PLAN: *Esophageal ulcer seen on EGD 5/22.  ? Radiation induced.  Biopsies showed chronic inflammation and focal ulceration.  *Blood loss anemia secondary to above:  Received 4 units PRBC's during hospital stay.  Most recent Hgb one week ago was improving.   *History of esophageal squamous cell carcinoma diagnosed 04/2016.  Had palliative radiation and chemo. *COPD, OSA:  On home O2. *IDDM:  Insulin will be adjusted prior to endoscopic procedure per protocol. Will resume normal dosing after procedure.  **Needs repeat EGD in 6-8 weeks from last.  Will schedule with Dr. Hilarie Fredrickson at [facility].  The risks, benefits, and alternatives to EGD were discussed with the patient and he consents to proceed. **Will continue pantoprazole 40 mg BID and carafate suspension BID for now.   CC:  McGowen, Adrian Blackwater, MD   Addendum: Reviewed and agree with initial management. Pyrtle, Lajuan Lines, MD

## 2018-04-03 NOTE — Addendum Note (Signed)
Addended by: Wyline Beady on: 04/03/2018 02:51 PM   Modules accepted: Orders, SmartSet

## 2018-04-03 NOTE — Patient Instructions (Signed)

## 2018-04-07 ENCOUNTER — Encounter: Payer: Self-pay | Admitting: Family Medicine

## 2018-04-07 ENCOUNTER — Other Ambulatory Visit: Payer: Self-pay | Admitting: Cardiology

## 2018-04-07 DIAGNOSIS — J449 Chronic obstructive pulmonary disease, unspecified: Secondary | ICD-10-CM | POA: Diagnosis not present

## 2018-04-15 ENCOUNTER — Telehealth: Payer: Self-pay | Admitting: Hematology

## 2018-04-15 ENCOUNTER — Encounter: Payer: Self-pay | Admitting: Hematology

## 2018-04-15 ENCOUNTER — Inpatient Hospital Stay (HOSPITAL_BASED_OUTPATIENT_CLINIC_OR_DEPARTMENT_OTHER): Payer: Medicare Other | Admitting: Hematology

## 2018-04-15 ENCOUNTER — Inpatient Hospital Stay: Payer: Medicare Other | Attending: Hematology

## 2018-04-15 ENCOUNTER — Inpatient Hospital Stay: Payer: Medicare Other

## 2018-04-15 VITALS — BP 110/67 | HR 88 | Temp 98.6°F | Resp 18 | Ht 68.0 in | Wt 286.3 lb

## 2018-04-15 DIAGNOSIS — E875 Hyperkalemia: Secondary | ICD-10-CM | POA: Insufficient documentation

## 2018-04-15 DIAGNOSIS — R188 Other ascites: Secondary | ICD-10-CM | POA: Diagnosis not present

## 2018-04-15 DIAGNOSIS — J90 Pleural effusion, not elsewhere classified: Secondary | ICD-10-CM | POA: Insufficient documentation

## 2018-04-15 DIAGNOSIS — J449 Chronic obstructive pulmonary disease, unspecified: Secondary | ICD-10-CM | POA: Diagnosis not present

## 2018-04-15 DIAGNOSIS — C160 Malignant neoplasm of cardia: Secondary | ICD-10-CM | POA: Insufficient documentation

## 2018-04-15 DIAGNOSIS — I700 Atherosclerosis of aorta: Secondary | ICD-10-CM | POA: Insufficient documentation

## 2018-04-15 DIAGNOSIS — E1122 Type 2 diabetes mellitus with diabetic chronic kidney disease: Secondary | ICD-10-CM | POA: Insufficient documentation

## 2018-04-15 DIAGNOSIS — Z923 Personal history of irradiation: Secondary | ICD-10-CM | POA: Diagnosis not present

## 2018-04-15 DIAGNOSIS — Z9221 Personal history of antineoplastic chemotherapy: Secondary | ICD-10-CM

## 2018-04-15 DIAGNOSIS — K5730 Diverticulosis of large intestine without perforation or abscess without bleeding: Secondary | ICD-10-CM | POA: Insufficient documentation

## 2018-04-15 DIAGNOSIS — E785 Hyperlipidemia, unspecified: Secondary | ICD-10-CM | POA: Diagnosis not present

## 2018-04-15 DIAGNOSIS — I130 Hypertensive heart and chronic kidney disease with heart failure and stage 1 through stage 4 chronic kidney disease, or unspecified chronic kidney disease: Secondary | ICD-10-CM

## 2018-04-15 DIAGNOSIS — E119 Type 2 diabetes mellitus without complications: Secondary | ICD-10-CM

## 2018-04-15 DIAGNOSIS — I252 Old myocardial infarction: Secondary | ICD-10-CM | POA: Insufficient documentation

## 2018-04-15 DIAGNOSIS — C787 Secondary malignant neoplasm of liver and intrahepatic bile duct: Secondary | ICD-10-CM | POA: Diagnosis not present

## 2018-04-15 DIAGNOSIS — K7460 Unspecified cirrhosis of liver: Secondary | ICD-10-CM

## 2018-04-15 DIAGNOSIS — N183 Chronic kidney disease, stage 3 (moderate): Secondary | ICD-10-CM | POA: Diagnosis not present

## 2018-04-15 DIAGNOSIS — R161 Splenomegaly, not elsewhere classified: Secondary | ICD-10-CM

## 2018-04-15 DIAGNOSIS — Z95828 Presence of other vascular implants and grafts: Secondary | ICD-10-CM

## 2018-04-15 DIAGNOSIS — I5032 Chronic diastolic (congestive) heart failure: Secondary | ICD-10-CM

## 2018-04-15 DIAGNOSIS — Z87891 Personal history of nicotine dependence: Secondary | ICD-10-CM | POA: Diagnosis not present

## 2018-04-15 DIAGNOSIS — Z9981 Dependence on supplemental oxygen: Secondary | ICD-10-CM | POA: Insufficient documentation

## 2018-04-15 DIAGNOSIS — Z79899 Other long term (current) drug therapy: Secondary | ICD-10-CM

## 2018-04-15 DIAGNOSIS — I2510 Atherosclerotic heart disease of native coronary artery without angina pectoris: Secondary | ICD-10-CM | POA: Diagnosis not present

## 2018-04-15 DIAGNOSIS — I10 Essential (primary) hypertension: Secondary | ICD-10-CM

## 2018-04-15 LAB — CBC WITH DIFFERENTIAL/PLATELET
Basophils Absolute: 0 10*3/uL (ref 0.0–0.1)
Basophils Relative: 0 %
EOS PCT: 3 %
Eosinophils Absolute: 0.2 10*3/uL (ref 0.0–0.5)
HCT: 32.8 % — ABNORMAL LOW (ref 38.4–49.9)
Hemoglobin: 9.8 g/dL — ABNORMAL LOW (ref 13.0–17.1)
LYMPHS ABS: 0.9 10*3/uL (ref 0.9–3.3)
Lymphocytes Relative: 17 %
MCH: 28.5 pg (ref 27.2–33.4)
MCHC: 29.9 g/dL — AB (ref 32.0–36.0)
MCV: 95.3 fL (ref 79.3–98.0)
MONO ABS: 0.6 10*3/uL (ref 0.1–0.9)
MONOS PCT: 12 %
Neutro Abs: 3.5 10*3/uL (ref 1.5–6.5)
Neutrophils Relative %: 68 %
PLATELETS: 109 10*3/uL — AB (ref 140–400)
RBC: 3.44 MIL/uL — ABNORMAL LOW (ref 4.20–5.82)
RDW: 19.5 % — AB (ref 11.0–14.6)
WBC: 5.3 10*3/uL (ref 4.0–10.3)

## 2018-04-15 LAB — CMP (CANCER CENTER ONLY)
ALT: 23 U/L (ref 0–55)
AST: 33 U/L (ref 5–34)
Albumin: 3.2 g/dL — ABNORMAL LOW (ref 3.5–5.0)
Alkaline Phosphatase: 68 U/L (ref 40–150)
Anion gap: 9 (ref 3–11)
BUN: 14 mg/dL (ref 7–26)
CHLORIDE: 96 mmol/L — AB (ref 98–109)
CO2: 33 mmol/L — AB (ref 22–29)
CREATININE: 1.02 mg/dL (ref 0.70–1.30)
Calcium: 9.2 mg/dL (ref 8.4–10.4)
GFR, Est AFR Am: >60 mL/min (ref >60)
GFR, Estimated: >60 mL/min (ref >60)
Glucose, Bld: 142 mg/dL — ABNORMAL HIGH (ref 70–140)
Potassium: 3.8 mmol/L (ref 3.5–5.1)
Sodium: 138 mmol/L (ref 136–145)
Total Bilirubin: 0.6 mg/dL (ref 0.2–1.2)
Total Protein: 7.1 g/dL (ref 6.4–8.3)

## 2018-04-15 MED ORDER — HEPARIN SOD (PORK) LOCK FLUSH 100 UNIT/ML IV SOLN
500.0000 [IU] | Freq: Once | INTRAVENOUS | Status: AC | PRN
  Administered 2018-04-15: 500 [IU] via INTRAVENOUS
  Filled 2018-04-15: qty 5

## 2018-04-15 MED ORDER — SODIUM CHLORIDE 0.9% FLUSH
10.0000 mL | INTRAVENOUS | Status: DC | PRN
  Administered 2018-04-15: 10 mL via INTRAVENOUS
  Filled 2018-04-15: qty 10

## 2018-04-15 NOTE — Telephone Encounter (Signed)
Scheduled apt per 6/18 los - gave patient aVS and calender per los.  

## 2018-04-15 NOTE — Progress Notes (Signed)
[place]  [phone] [phone]  Clinic Follow up Note   Patient Care Team: Tammi Sou, MD as PCP - General (Family Medicine) Harold Hedge, Darrick Grinder, MD as Consulting Physician (Allergy and Immunology) Shirley Muscat Loreen Freud, MD as Consulting Physician (Optometry) Sueanne Margarita, MD as Consulting Physician (Cardiology) Pyrtle, Lajuan Lines, MD as Consulting Physician (Gastroenterology) Latanya Maudlin, MD as Consulting Physician (Orthopedic Surgery) Jodi Marble, MD as Consulting Physician (Otolaryngology) Truitt Merle, MD as Consulting Physician (Hematology) Kyung Rudd, MD as Consulting Physician (Radiation Oncology)   Date of Service: 04/15/2018     CHIEF COMPLAINTS:  Follow up GE junction cancer  Oncology History   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 The mass was not obstructing and partially circumferential, extends approximately 5 cm. Nonbleeding erosive gastropathy.      05/04/2016 Initial Biopsy    Esophageal gastric junction biopsy showed a poorly differentiated carcinoma underlying the squamous mucosa. There is lymphovascular invasion. No Intestinal metaplasia. IHC weakly positive CK5/6, p63 (-), favor squamous.        05/09/2016 Imaging    CT CAP w contrast showed mild wall thickening involving the distal esophagus and proximal stomach compatible with known cancer, enlarged mediastinal and upper abdominal lymph nodes are highly suspicious for metastatic adenopathy, propable liver cirrhosis.      06/04/2016 -  06/22/2016 Radiation Therapy    palliative radiation to esophageal cancer       07/06/2016 - 06/20/2017 Chemotherapy    chemotherapy with weekly carboplatin and taxol, started on 07/06/2016, held 08/21/16-09/28/2016 due to hospitalization, changed to every 2 weeks from 11/30/2016. Chemo stopped due to no evidence of disease on restaging scan.      07/30/2016 Pathology Results    PD-L1 negative expression      08/28/2016 - 08/31/2016 Hospital Admission    The patient was admitted to 4 severe hypoglycemia, dehydration, and acute renal failure. Infection workup was negative. He recovered well with supportive care. His insulin and hypertension medication was held on discharge, except insulin sliding scale.      09/24/2016 Imaging    CT CAP W CONTRAST 09/26/2016 IMPRESSION: Decreased masslike soft tissue prominence at gastroesophageal junction, consistent with decreased size of primary gastroesophageal junction carcinoma. Resolution of paraesophageal and gastrohepatic ligament lymphadenopathy since prior exam. Other sub-cm mediastinal lymph nodes show little or no significant change. Stable indeterminate sub-cm low-attenuation lesion in the liver dome. Probable hepatic cirrhosis. Recommend continued attention on follow-up CT. No new or progressive metastatic disease identified. Cholelithiasis.  No radiographic evidence of cholecystitis. Colonic diverticulosis. No radiographic evidence of diverticulitis. Aortic atherosclerosis and three-vessel coronary artery calcification.      01/07/2017 Imaging    CT CAP w contrast 1. No residual esophageal mass or adenopathy. 2. New nodular airspace opacification in the left lower lobe. Continued attention on followup exams is warranted as metastatic disease cannot be definitively excluded. 3. Cirrhosis. 4. Trace left pleural effusion. 5. Aortic atherosclerosis (ICD10-170.0). Coronary artery calcification. 6. Cholelithiasis. 7. Mild basilar predominant  subpleural  reticular densities. Difficult to exclude interstitial lung disease.      04/29/2017 Imaging    CT CAP w contrast  IMPRESSION: 1. No evidence of metastatic disease. 2. Nodular airspace disease previously seen in the left lower lobe has resolved in the interval. 3. Tiny right pleural effusion, new. Small left pleural effusion, slightly increased. 4. Cirrhosis with splenomegaly. 5.  Aortic atherosclerosis (ICD10-170.0).      09/05/2017 Imaging    CT CAP W Contrast 09/05/17  IMPRESSION: 1. Currently no esophageal mass is visible, nor is there significant mediastinal adenopathy. There is hepatic cirrhosis but no compelling findings of hepatic metastatic disease. 2. Large right pleural effusion is increased from the prior exam. Small stable left pleural effusion. 3. Small but increased amount of ascites and mesenteric edema compared to prior. 4. Other imaging findings of potential clinical significance: Aortic Atherosclerosis (ICD10-I70.0). Coronary atherosclerosis. The markedly severe right glenohumeral arthropathy. Thoracic and lumbar spondylosis with lower lumbar multilevel impingement, and notable intervertebral spurring at T12-L1. Cholelithiasis. Degenerative arthropathy of both hips.       10/21/2017 Imaging    Chest X-Ray IMPRESSION: Small bilateral effusions, decreased on the right since prior CT.      12/05/2017 Imaging    CT CAP W Contrast 12/05/17 IMPRESSION: 1. Stable exam. No esophageal mass or mediastinal adenopathy identified. No evidence for metastatic disease to the chest, abdomen and pelvis. 2. Cirrhosis with ascites. 3. Persistent bilateral pleural effusions, right greater than left. 4. Aortic Atherosclerosis (ICD10-I70.0). Three vessel coronary artery atherosclerotic calcifications noted. 5. Thoracic and lumbar spondylosis and advanced right glenohumeral joint arthropathy. 6. Gallstones.      03/18/2018 - 03/19/2018 Hospital Admission    Admit  date: 03/18/18  Admission diagnosis: GI Bleeding from grastic ulcer bleed Additional comments: Had Upper Endoscopy, and blood trasnfusion on 03/19/18. He is no longer on aspirin.       03/19/2018 Procedure    Upper Endoscopy by Dr. Hilarie Fredrickson on 03/19/18  IMPRESSION - Distal esophageal ulcer. Biopsied (could represent radiation-induced ulcers, rule out recurrence) - Non-bleeding erosive gastropathy. Biopsied. And benign - Normal examined duodenum.   Diagnosis 1. Stomach, biopsy - MILD CHRONIC GASTRITIS WITHOUT ACTIVITY - NO H. PYLORI OR INTESTINAL METAPLASIA IDENTIFIED - SEE COMMENT 2. Esophagus, biopsy, distal - SQUAMOCOLUMNAR JUNCTION WITH CHRONIC INFLAMMATION AND FOCAL ULCERATION - NO INTESTINAL METAPLASIA OR MALIGNANCY IDENTIFIED Microscopic Comment 1. A Warthin-Starry stain is performed to determine the possibility of the presence of Helicobacter pylori. The Warthin-Starry stain is negative for organisms morphologically consistent with Helicobacter pylori.       HISTORY OF PRESENTING ILLNESS:  [name] 72 y.o. male is here because of His reason that diagnosed GE junction carcinoma. He is a comment of by his wife and daughter to our multidisciplinary chart clinic today.  He has been having progressive dysphagia and odynophagia for 2 month, he has been eating soft diet only in the past one week. He has pain in the mid chest and epigastric area only when he eats, with burning sensation, no nausea, abdominal bloating, or other discomfort. His appetite has remained well, no weight loss,   He has multiple medical problems, especially heart failure, he has been on oxygen continuously for 5-6 years. He is morbidly obese, has diabetes, hypertension, mild neuropathy, OSA etc. He has very sedentary life style, he sits in the chair and watch TV most of time during the day. He only comes out for shopping for a few times a week. He uses a Furniture conservator/restorer  and walker, he can walk for a few hundreds feet  before he has to stop to catch his breasts. He denies cough or sputum production, no GI discomfort, he has been having mild diarrhea, loose stool, twice daily, no melena or hematochezia.  CURRENT THERAPY: Observation  INTERIM HISTORY:   [name] returns for follow-up of his esophogeal cancer. Since his last visit he was admitted to the hospital due to GI bleeding and underwent upper endoscopy by Dr. Hilarie Fredrickson on 03/19/18 which shows bleeding ulcer in stomach. He was given blood transfusion. He notes he did not have pain but only had black stool. He plans to repeat on 04/28/18. He is to stop aspirin from arthritis. He was given short course of Carafate.   He presents to the clinic on continuous O2 and accompanied by his daughter. He is overall doing much better and has not had any further GI bleeding since.     MEDICAL HISTORY:  Past Medical History:  Diagnosis Date  . Acute upper GI bleed 02/2018   Transfused 4 U pRBCs-->EGD showed an esoph ulcer and some gastric ulcers (?radiation-induced), path showed no malignancy or Barrett's.  GI plans on repeat EGD 6-8 weeks after the 03/19/18 EGD.  Marland Kitchen Asthma    as a child  . CAD in native artery    a. cardiac cath 09/2013 showed severely diseased mLAD and diagonal, mild LCx disease, moderate RCA disease, LVEDP 20, EF 50%.  . Cataract    multiple types, bilateral  . Cholelithiasis without cholecystitis   . Chronic diastolic CHF (congestive heart failure) ([place]) 02/25/2009  . Chronic renal insufficiency, stage III (moderate) (HCC) 2017   Stage II/III (GFR around 60)  . 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  CT 11/2017 w/ascites and persistent bilat pleural effusions  . 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 COPD (chronic obstructive pulmonary disease) ([place])   . Coronary artery disease    chronically occluded LAD and diagonal with right to left collaterals, mild disease  in the left circ and moderate disease in the mid RCA on medical management with Imdur, ASA, and Plavix.  . Dermatitis 05/2014  . DIABETES MELLITUS, TYPE II 06/24/2007   No diab retpthy as of 08/05/15 eye exam.  . 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 Pyrtle--EGD).  CT C/A/P showed metastatic adenopathy in mediastinum and upper abdomen 05/09/16.  Tx plan is palliative radiation (completed 06/22/16), then palliative systemic chemotherapy (carbo+taxol) was started but as of 08/03/16 onc f/u this was held due to severe knee and ankle arthralgias.  Restarting as of 10/2015 (08/2016 CT showed some disease regression  . Esophageal cancer ([place])    CT 12/2016 showed no residual esoph mass--plan to continue chemo.  Ongoing palliative chemo with carboplatin and taxol q 2 wks as of 03/2017 onc f/u (CT 12/2016 and 04/2017 showed no progression of dz).  Taking a 2 mo break from chemo as of 05/2017.  Pleural effusion-- improved with lasix but onc wanted him to get dx/therap thoracentesis-pt declined.  CT C/A/P no progression/recurr/mets 11/2017.  Marland Kitchen Essential hypertension 05/01/2007   Qualifier: Diagnosis of  By: Tiney Rouge CMA, Ellison Hughs     . History of kidney stones   . Hyperkalemia 12/2015   Decreased ACE-I by 50% in response, then potassium normalized.  Marland Kitchen HYPERLIPIDEMIA 06/24/2007  . Hypoxemia 01/27/2010  . Morbid obesity ([place])   . Myocardial  infarction [facility])    pt states he was informed per MD that he has had one but pt was unaware   . Obesity hypoventilation syndrome (H[facility])    oxygen 24/7 (2 liters [place] as of 02/2016)  . On home oxygen therapy    Oxygen @ 2l/m nasally 24/7 hours  . OSA (obstructive sleep apnea)    not tested; pt scored 4 per stop bang tool results sent to PCP   . OSTEOARTHRITIS 05/01/2007  . Pancytopenia due to chemotherapy ([place]) 2018  . Pleural effusion on right 08/2017   ? malignant vs CHF: pt refuses diagnostic thoracentesis b/c he states he feels fine, wants to try diuretic  first.  . Pleural effusion on right 09/2017   Thoracentesis  . Presbycusis of both ears 05/2015   [facility]  . Pruritic condition 05/2014   Allergist summer 2015, no new testing.  Marland Kitchen RBBB   . Visual field defect 05/10/2016   Per Dr. Melina Fiddler, O.D.: Rt, loss of inf/temp quad and some loss sup/temp.  ?CVA  ? Pituitary tumor? 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    SURGICAL HISTORY: Past Surgical History:  Procedure Laterality Date  . APPENDECTOMY    . BALLOON DILATION N/A 05/04/2016   Procedure: BALLOON DILATION;  Surgeon: Jerene Bears, MD;  Location: [facility];  Service: Gastroenterology;  Laterality: N/A;  . BIOPSY  03/19/2018   Procedure: BIOPSY;  Surgeon: Jackquline Denmark, MD;  Location: [facility];  Service: Endoscopy;;  . CARDIAC CATHETERIZATION    . CARPAL TUNNEL RELEASE Left   . CATARACT EXTRACTION W/PHACO Right 04/29/2013   Procedure: CATARACT EXTRACTION PHACO AND INTRAOCULAR LENS PLACEMENT (IOC);  Surgeon: Adonis Brook, MD;  Location: [facility];  Service: Ophthalmology;  Laterality: Right;  . CATARACT EXTRACTION, BILATERAL    . COLONOSCOPY W/ POLYPECTOMY  08/2011   Many polyps--all hyperplastic, severe diverticulosis, int hem.  [employer] hemoccult testing via [facility] 06/22/15 NEG  . ESOPHAGOGASTRODUODENOSCOPY N/A 05/04/2016   Procedure: ESOPHAGOGASTRODUODENOSCOPY (EGD);  Surgeon: Jerene Bears, MD;  Location: [facility];  Service: Gastroenterology;  Laterality: N/A;  . ESOPHAGOGASTRODUODENOSCOPY (EGD) WITH PROPOFOL N/A 03/19/2018   Gastric ulcers, esoph ulcer (radiation-induced?), bx-->path neg for malignancy or Barrett's.  GI plans repeat EGD 6-8 wks after 1st.   Procedure: ESOPHAGOGASTRODUODENOSCOPY (EGD) WITH PROPOFOL;  Surgeon: Jackquline Denmark, MD;  Location: [facility];  Service: Endoscopy;  Laterality: N/A;  . IR GENERIC HISTORICAL  10/03/2016   IR US GUIDE VASC ACCESS RIGHT 10/03/2016 Greggory Keen, MD [facility]  . IR GENERIC HISTORICAL   10/03/2016   IR FLUORO GUIDE PORT INSERTION RIGHT 10/03/2016 Greggory Keen, MD [facility]  . KNEE ARTHROSCOPY WITH MEDIAL MENISECTOMY Right 12/16/2014   Procedure: RIGHT KNEE ARTHROSCOPY WITH MEDIAL MENISECTOMY microfracture medial femoral condyle abrasion condroplasty medial femoral condyle lateral menisectomy;  Surgeon: Tobi Bastos, MD;  Location: [facility];  Service: Orthopedics;  Laterality: Right;  . LEFT HEART CATHETERIZATION WITH CORONARY ANGIOGRAM N/A 10/26/2013   Procedure: LEFT HEART CATHETERIZATION WITH CORONARY ANGIOGRAM;  Surgeon: Jettie Booze, MD;  [facility];  Service: Cardiovascular;  Laterality: N/A;  . LUMBAR [place] SURGERY  2001  . SHOULDER SURGERY Right    right fx  . TONSILLECTOMY    . TRANSTHORACIC ECHOCARDIOGRAM  03/2016   EF 55-60%, grade I DD, LAE  . WRIST SURGERY Right    right fx    SOCIAL HISTORY: Social History   Socioeconomic History  .  Marital status: Married    Spouse name: susan  . Number of children: 2  . Years of education: Not on file  . Highest education level: Not on file  Occupational History  . Occupation: Retired  Scientific laboratory technician  . Financial resource strain: Not on file  . Food insecurity:    Worry: Not on file    Inability: Not on file  . Transportation needs:    Medical: Not on file    Non-medical: Not on file  Tobacco Use  . Smoking status: Former Smoker    Packs/day: 1.50    Years: 30.00    Pack years: 45.00    Types: Cigarettes, Pipe, Cigars    Last attempt to quit: 10/30/1983    Years since quitting: 34.4  . Smokeless tobacco: Current User    Types: Chew    Last attempt to quit: 05/15/2016  Substance and Sexual Activity  . Alcohol use: No  . Drug use: No  . Sexual activity: Not Currently  Lifestyle  . Physical activity:    Days per week: Not on file    Minutes per session: Not on file  . Stress: Not on file  Relationships  . Social connections:    Talks on phone: Not on file    Gets together: Not on  file    Attends religious service: Not on file    Active member of club or organization: Not on file    Attends meetings of clubs or organizations: Not on file    Relationship status: Not on file  . Intimate partner violence:    Fear of current or ex partner: Not on file    Emotionally abused: Not on file    Physically abused: Not on file    Forced sexual activity: Not on file  Other Topics Concern  . Not on file  Social History Narrative   Married, one son and one daughter.   His daughter and her two children live with him.   Coffee daily.  Former smoker.  No alcohol.   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   Attends church weekly-   Oxygen continuous    FAMILY HISTORY: Family History  Problem Relation Age of Onset  . Heart attack Mother   . Aneurysm Father   . Alcohol abuse Father   . Diabetes Maternal Aunt   . Colon cancer Neg Hx     ALLERGIES:  has No Known Allergies.  MEDICATIONS:  Current Outpatient Medications  Medication Sig Dispense Refill  . atorvastatin (LIPITOR) 80 MG tablet TAKE 1 TABLET BY MOUTH ONCE DAILY 90 tablet 3  . baclofen (LIORESAL) 10 MG tablet 1/2 - 1 tab po q8h prn muscle spasms 30 each 1  . beta carotene w/minerals (OCUVITE) tablet Take 1 tablet by mouth daily.    . cetirizine (ZYRTEC) 10 MG tablet Take 10 mg by mouth daily.    . citalopram (CELEXA) 20 MG tablet TAKE 1 TABLET BY MOUTH ONCE DAILY 90 tablet 1  . clonazePAM (KLONOPIN) 1 MG tablet TAKE 1 TABLET BY MOUTH TWICE DAILY AS NEEDED FOR  ANXIETY 60 tablet 5  . clotrimazole-betamethasone (LOTRISONE) cream Apply to affected area twice daily 45 g 2  . cromolyn (OPTICROM) 4 % ophthalmic solution Place 1 drop into both eyes. 3 to 4 times daily    . ezetimibe (ZETIA) 10 MG tablet TAKE 1 TABLET BY MOUTH ONCE DAILY 90 tablet 3  . furosemide (LASIX) 20 MG  tablet Take 1 tablet (20 mg total) by mouth daily. 90 tablet 1  . gabapentin (NEURONTIN) 300 MG capsule TAKE ONE  CAPSULE BY MOUTH THREE TIMES DAILY 270 capsule 3  . hydrOXYzine (ATARAX/VISTARIL) 25 MG tablet TAKE 1 TABLET BY MOUTH WITH SUPPER AND 1 AT BEDTIME FOR INSOMNIA 60 tablet 6  . Insulin Lispro (HUMALOG KWIKPEN) 200 UNIT/ML SOPN Inject 20 Units into the skin 2 (two) times daily with a meal. 18 pen 6  . Insulin NPH, Human,, Isophane, (HUMULIN N) 100 UNIT/ML Kiwkpen 16 units every morning and 16 units every evening 15 mL 3  . magnesium chloride (SLOW-MAG) 64 MG TBEC SR tablet Take 1 tablet (64 mg total) by mouth daily. 60 tablet 0  . metFORMIN (GLUCOPHAGE) 1000 MG tablet TAKE 1 TABLET BY MOUTH TWICE DAILY WITH MEALS 180 tablet 1  . Omega-3 Fatty Acids ([employer] FISH OIL) 1000 MG CAPS Take 2 tablets by mouth 2 (two) times daily. 90 capsule   . ONE TOUCH ULTRA TEST test strip USE  STRIP TO CHECK GLUCOSE THREE TIMES DAILY AS  DIRECTED 100 each 11  . ONETOUCH DELICA LANCETS 21J MISC USE ONE  TO CHECK GLUCOSE THREE TIMES DAILY 100 each 11  . OXYGEN Inhale 2 L/min into the lungs continuous.    . pantoprazole (PROTONIX) 40 MG tablet Take 1 tablet (40 mg total) by mouth 2 (two) times daily. 60 tablet 6  . sucralfate (CARAFATE) 1 GM/10ML suspension Take 10 mLs (1 g total) by mouth 2 (two) times daily. 420 mL 0   No current facility-administered medications for this visit.    Facility-Administered Medications Ordered in Other Visits  Medication Dose Route Frequency Provider Last Rate Last Dose  . sodium chloride flush (NS) 0.9 % injection 10 mL  10 mL Intravenous PRN Truitt Merle, MD   10 mL at 04/16/17 1459  . sodium chloride flush (NS) 0.9 % injection 10 mL  10 mL Intravenous PRN Truitt Merle, MD   10 mL at 05/02/17 1725    REVIEW OF SYSTEMS:  Constitutional: Denies fevers, abnormal night sweats  Eyes: Denies blurriness of vision, double vision or watery eyes Ears, nose, mouth, throat, and face: Denies mucositis or sore throat, Respiratory: Denies cough, wheezes   Cardiovascular: Denies palpitation, chest  discomfort or lower extremity swelling Gastrointestinal:  Denies nausea, heartburn or change in bowel habits  Skin: Denies abnormal skin rashes  Lymphatics: Denies new lymphadenopathy or easy bruising Neurological:Denies numbness, tingling  Behavioral/Psych: Mood is stable, no new changes  All other systems were reviewed with the patient and are negative.  PHYSICAL EXAMINATION:  ECOG PERFORMANCE STATUS: 3 - Symptomatic, >50% confined to bed BP 110/67 (BP Location: Left Arm, Patient Position: Sitting)   Pulse 88   Temp 98.6 F (37 C) (Oral)   Resp 18   Ht 5' 8" (1.727 m)   Wt 286 lb 4.8 oz (129.9 kg)   SpO2 96%   BMI 43.53 kg/m   GENERAL:alert, no distress and comfortable, morbid obese, sitting in wheelchair with nasal cannula oxygen  SKIN: skin color, texture, turgor are normal, no rashes or significant lesions EYES: normal, conjunctiva are pink and non-injected, sclera clear OROPHARYNX:no exudate, no erythema and lips, buccal mucosa, and tongue normal  NECK: supple, thyroid normal size, non-tender, without nodularity LYMPH:  no palpable lymphadenopathy in the cervical, axillary or inguinal LUNGS: (+) minimally decreased in breath sounds in the bottom of right lung, improved from last visit (+) stable b/l pleural effusion R>L HEART:  regular rate & rhythm and no murmurs and no lower extremity edema ABDOMEN:abdomen soft, non-tender and normal bowel sounds Musculoskeletal:no cyanosis of digits and no clubbing  PSYCH: alert & oriented x 3 with fluent speech NEURO: no focal motor/sensory deficits EXT: Trace edema, he wears compression stocks, no significant ankle swollen, erythema, or warmness.   LABORATORY DATA:  I have reviewed the data as listed CBC Latest Ref Rng & Units 04/15/2018 03/28/2018 03/21/2018  WBC 4.0 - 10.3 K/uL 5.3 4.2 5.3  Hemoglobin 13.0 - 17.1 g/dL 9.8(L) 9.5(L) 8.7(L)  Hematocrit 38.4 - 49.9 % 32.8(L) 29.4(L) 27.9(L)  Platelets 140 - 400 K/uL 109(L) 145 103(L)    CMP Latest Ref Rng & Units 04/15/2018 03/28/2018 03/19/2018  Glucose 70 - 140 mg/dL 142(H) 118(H) 157(H)  BUN 7 - 26 mg/dL 14 12 52(H)  Creatinine 0.70 - 1.30 mg/dL 1.02 1.10 1.08  Sodium 136 - 145 mmol/L 138 138 137  Potassium 3.5 - 5.1 mmol/L 3.8 4.4 4.1  Chloride 98 - 109 mmol/L 96(L) 99 95(L)  CO2 22 - 29 mmol/L 33(H) 27 32  Calcium 8.4 - 10.4 mg/dL 9.2 8.9 8.3(L)  Total Protein 6.4 - 8.3 g/dL 7.1 6.6 -  Total Bilirubin 0.2 - 1.2 mg/dL 0.6 0.6 -  Alkaline Phos 40 - 150 U/L 68 - -  AST 5 - 34 U/L 33 38(H) -  ALT 0 - 55 U/L 23 32 -   PATHOLOGY REPORT Diagnosis 05/04/2016 1. Esophagogastric junction, biopsy, ulcerative mass - POORLY DIFFERENTIATED CARCINOMA, SEE COMMENT. 2. Stomach, biopsy - REACTIVE GASTROPATHY. - NEGATIVE FOR HELICOBACTER PYLORI. - NO INTESTINAL METAPLASIA, DYSPLASIA, OR MALIGNANCY. Microscopic Comment 1. The majority of the specimen consists of gastroesophageal mucosa with reflux changes. There is a small focus of tumor underlying the squamous mucosa. There is lymphovascular invasion. There is no background intestinal metaplasia (Barrett's esophagus). Immunohistochemistry reveals only very focal weak cytokeratin 5/6, negative p63, and negative mucicarmine in the limited remaining tumor. Dr. Saralyn Pilar has reviewed the case. The case was called to Dr. Hilarie Fredrickson on 05/08/2016. 2. A Warthin-Starry stain is performed to determine the possibility of the presence of Helicobacter pylori. The Warthin-Starry stain is negative for organisms of Helicobacter pylori.      PROCEDURES  Upper Endoscopy by Dr. Hilarie Fredrickson on 03/19/18  IMPRESSION - Distal esophageal ulcer. Biopsied (could represent radiation-induced ulcers, rule out recurrence) - Non-bleeding erosive gastropathy. Biopsied. And benign - Normal examined duodenum.   RADIOGRAPHIC STUDIES: I have personally reviewed the radiological images as listed and agreed with the findings in the report.  CT CAP W Contrast  12/05/17 IMPRESSION: 1. Stable exam. No esophageal mass or mediastinal adenopathy identified. No evidence for metastatic disease to the chest, abdomen and pelvis. 2. Cirrhosis with ascites. 3. Persistent bilateral pleural effusions, right greater than left. 4. Aortic Atherosclerosis (ICD10-I70.0). Three vessel coronary artery atherosclerotic calcifications noted. 5. Thoracic and lumbar spondylosis and advanced right glenohumeral joint arthropathy. 6. Gallstones.   Chest X-Ray 10/21/17 IMPRESSION: Small bilateral effusions, decreased on the right since prior CT.  CT CAP W Contrast 09/05/17  IMPRESSION: 1. Currently no esophageal mass is visible, nor is there significant mediastinal adenopathy. There is hepatic cirrhosis but no compelling findings of hepatic metastatic disease. 2. Large right pleural effusion is increased from the prior exam. Small stable left pleural effusion. 3. Small but increased amount of ascites and mesenteric edema compared to prior. 4. Other imaging findings of potential clinical significance: Aortic Atherosclerosis (ICD10-I70.0). Coronary atherosclerosis. The markedly severe right glenohumeral arthropathy.  Thoracic and lumbar spondylosis with lower lumbar multilevel impingement, and notable intervertebral spurring at T12-L1. Cholelithiasis. Degenerative arthropathy of both hips.   CT CAP w contrast 04/29/2017 IMPRESSION: 1. No evidence of metastatic disease. 2. Nodular airspace disease previously seen in the left lower lobe has resolved in the interval. 3. Tiny right pleural effusion, new. Small left pleural effusion, slightly increased. 4. Cirrhosis with splenomegaly. 5.  Aortic atherosclerosis (ICD10-170.0).   CT CAP w contrast 01/07/2017 IMPRESSION: 1. No residual esophageal mass or adenopathy. 2. New nodular airspace opacification in the left lower lobe. Continued attention on followup exams is warranted as metastatic disease cannot be  definitively excluded. 3. Cirrhosis. 4. Trace left pleural effusion. 5. Aortic atherosclerosis (ICD10-170.0). Coronary artery calcification. 6. Cholelithiasis. 7. Mild basilar predominant subpleural reticular densities. Difficult to exclude interstitial lung disease.  EGD 05/04/2016 A large, ulcerating mass with no active bleeding and no stigmata of recent bleeding was found at the gastroesophageal junction extending into the gastric cardia, beginning 40 cm from the incisors. The mass was non-obstructing and partially circumferential (involving one-half of the lumen circumference). The mass extends approximately 5 cm. IMPRESSION:  -Likely malignant esophageal tumor was found at the gastroesophageal junction extending into the gastric cardia. Biopsied. - Non-bleeding erosive gastropathy. Biopsied. - Erythematous duodenopathy. - Normal second portion of the duodenum.  ASSESSMENT & PLAN: 72 y.o. Caucasian male, with multiple comorbidities, including hypertension, diabetes, OSA, coronary artery disease, diastolic CHF, on continuous oxygen, morbid obesity, presented with progressive dysphagia and odynophagia.  1. Cancer of cardio-esophageal junction, poorly differentiated carcinoma, cTxN2M1 with node metastasis, stage IV  -I previously reviewed his CT scan, EGD, and the biopsy results with patient and his family members in great details. -His biopsy pathology was previously reviewed in our tumor board, the morphology and weakly positive for CK 5/6, fevers squamous cell carcinoma -His CT scan findings are very concerning for distant metastasis to abdominal lymph nodes.  -I previously reviewed the PET scan images with patient and his daughter in person, he has hypermetabolic GE junction tumor, and diffuse adenopathy in mediastinum and upper abdomen. -We previously discussed the aggressive nature of esophageal cancer, an incurable nature of his disease due to the distant metastasis. The goal of  therapy is palliative, to prolong his life and palliate his symptoms. -His tumor was negative for PD-L1, not a candidate for immunotherapy  -He received first line palliative chemotherapy with weekly carbo and Taxol, every 2 weeks (07/06/16-8/23-18), tolerated well overall and had excellent response (completed NED radiographic response). -We previously reviewed the signs of cancer recurrence. If he notices any pain or new issues he knows to call us  -he has developed pleural effusion since he stopped chemotherapy, he is clinically doing better with restarting 66m, less orthopnea. I recommend him to follow-up with his primary care physician also. -I previously discussed his CT CAP from 12/05/17 which shows no evidence of recurrent or metastatic disease. Also shows cirrhosis with ascites, and persistent b/l pleural effusion.  -He was admitted to hospital on 03/18/18 due to upper GI bleeding. He underwent Upper Endoscopy by Dr. PHilarie Fredricksonon 03/19/18 which showed bleeding ulcer next to previous tumor site.  Multiple biopsy was negative for malignant cells.  He received a blood transfusion on 03/19/18.  -Labs reviewed, Hg at 9.8 today, I encouraged him to increase iron in diet and take multivitamin with iron OTC.  -F/u in 2 months with CT scan   2. Anemia, history of upper GI bleeding from gastric ulcer -Related  to recent GI Bleeding from gastric ulcer found on 03/19/18 Upper endoscopy -He was given blood transfusion on 03/19/18  -Hg at 9.7 today (04/15/18). I recommend he increase iron in diet and take OTC multivitamin with iron. He is agreeable.   3. Bilateral plural effusion, R>L  -Possibly related to his CHF, he was on furosemide 10 mg daily before chemo, which has been held since he started chemo.   -He had a trace pleural effusion on previous scan, has moderate right pleural effusion, asymptomatic -I previously recommended a thoracentesis, further workup of etiology of his pleural effusion, and ruled out  malignant pleural effusion. Patient is reluctant to have procedure, but agrees to have one if he does not respond to diuretics -Restarted furosemide 20 mg daily on 09/12/17   -CT from 12/05/17 showed persistent b/l pleural effusion -Pt experiences cramps from lasix but manageable.  I previously suggested tonic water or Gatorade and for him to take OTC calcium pill. He is fine to eat 1 banana a day.  -Upon today PE (04/15/18) there is still mild b/l pleural effusion at base of lungs. He will continue furosemide 65m daily   4. CAD, diastolic CHF, on continuous oxygen -He will continue follow-up with his cardiologist Dr. TRadford Pax-His Plavix has been held since the EGD and biopsy, he is on baby aspirin. -he is clinically stable -On 2 Liters of continuous oxygen [place].  -Will continue to monitor   5. DM, HTN  -We previously discussed that his blood pressure and glucose need to be monitored closely during the chemotherapy  -He will continue medication, monitor his sugar and blood pressure at home, and follow-up with his primary care physician. -previously decreased premed dexa before taxol  -DM and HTN Better controlled recently   6. OSA, morbid obesity -We previously discussed healthy diet, I previously encouraged him to be more physically active, and consider home PT -previously encouraged him to follow-up with dietitian during the therapy for nutrition support.  7. Arthralgia -He has baseline osteoarthritis, not physically active -He will use oxycodone 5 mg as needed, constipation management previously reviewed with him. -He is no longer taking aspirin, and his arthritis has not been present lately   8. Liver cirrhosis and ascites  -He had a mild elevated AST in the past. Pt states he has not consumed alcohol in 30 years.  -His prior CT scans showed evidence of liver cirrhosis and also showed mild to moderate ascites -He had no known history of liver disease, this could be related to fatty  liver or congestive heart failure.   -Continue diuretics, and follow-up   9. Goal of care discussion, DNR/DNI -We previously discussed the incurable nature of his cancer, and the overall poor prognosis, especially if he does not have good response to chemotherapy or progress on chemo -The patient understands the goal of care is palliative. -Patient is very realistic, understands his cancer is not curable, and he previously agreed with DO NOT RESUSCITATE and DO NOT INTUBATE.    PLAN -F/u in 2 months  -Lab and CT CAP with contrast a few days before    All questions were answered. The patient knows to call the clinic with any problems, questions or concerns.  I spent 20 minutes counseling the patient face to face. The total time spent in the appointment was 25 minutes and more than 50% was on counseling.  IOneal Deputy am acting as scribe for YTruitt Merle MD.   I have reviewed the above  documentation for accuracy and completeness, and I agree with the above.    Truitt Merle  04/15/2018

## 2018-04-24 ENCOUNTER — Encounter: Payer: Self-pay | Admitting: Family Medicine

## 2018-04-24 ENCOUNTER — Other Ambulatory Visit (INDEPENDENT_AMBULATORY_CARE_PROVIDER_SITE_OTHER): Payer: Medicare Other

## 2018-04-24 ENCOUNTER — Ambulatory Visit: Payer: Medicare Other | Admitting: Family Medicine

## 2018-04-24 VITALS — BP 112/69 | HR 93 | Temp 98.1°F | Resp 16 | Ht 68.0 in | Wt 284.5 lb

## 2018-04-24 DIAGNOSIS — E118 Type 2 diabetes mellitus with unspecified complications: Secondary | ICD-10-CM

## 2018-04-24 DIAGNOSIS — E785 Hyperlipidemia, unspecified: Secondary | ICD-10-CM

## 2018-04-24 DIAGNOSIS — J9611 Chronic respiratory failure with hypoxia: Secondary | ICD-10-CM | POA: Diagnosis not present

## 2018-04-24 DIAGNOSIS — Z794 Long term (current) use of insulin: Secondary | ICD-10-CM

## 2018-04-24 DIAGNOSIS — Z8719 Personal history of other diseases of the digestive system: Secondary | ICD-10-CM

## 2018-04-24 DIAGNOSIS — D508 Other iron deficiency anemias: Secondary | ICD-10-CM

## 2018-04-24 LAB — POCT GLYCOSYLATED HEMOGLOBIN (HGB A1C): HEMOGLOBIN A1C: 5.4 % (ref 4.0–5.6)

## 2018-04-24 NOTE — Patient Instructions (Signed)
STOP METFORMIN.

## 2018-04-24 NOTE — Progress Notes (Signed)
OFFICE VISIT  04/24/2018   CC:  Chief Complaint  Patient presents with  . Follow-up    RCI     HPI:    Patient is a 72 y.o. Caucasian male who presents accompanied by his daughter for f/u DM 2, chronic hypoxemic RF (obesity hypoventilation, COPD, chronic diastolic HF), and recent IDA from upper GI bleeding. Has advanced esophageal cancer and has completed palliative chemo--on observation now.  Most recent surveillance CT showed no sign of dz progression or recurrence 11/2017.  GI bleed:  upper GI bleed with severe IDA about a month ago: EGD-->gastric ulcers.  Hiis post hospital Hb last visit was up appropriately to 9.5.  He is set to get a repeat EGD 04/28/18 with GI. He is having no melena or hematochezia.  Saw hem/onc, Dr. Burr Medico, 04/15/18 and his lasix 70m qd was restarted for attempt to help his bilat pleural effusions. Pt declines thoracentesis but reluctantly agreed to proceed with this procedure but ONLY if diuresis is not helpful. His repeat Hb at that visit was 9.7. Next surveillance CT for his metastatic esoph cancer is 06/15/18--next hem/onc visit at that time as well.   Pt states today that he feels just fine.  Daughter says he is acting more up beat.  His cardiologist drew blood for cholesterol check this morning.  DM: humalog 20 U BF and Supper and taking NPH 16 U qAM and 16 u qsupper. Fasting and random glucoses range 130s up to 160s---good range, no hypoglycemia.    Past Medical History:  Diagnosis Date  . Acute upper GI bleed 02/2018   Transfused 4 U pRBCs-->EGD showed an esoph ulcer and some gastric ulcers (?radiation-induced), path showed no malignancy or Barrett's.  GI plans on repeat EGD 6-8 weeks after the 03/19/18 EGD.  .Marland KitchenAsthma    as a child  . CAD in native artery    a. cardiac cath 09/2013 showed severely diseased mLAD and diagonal, mild LCx disease, moderate RCA disease, LVEDP 20, EF 50%.  . Cataract    multiple types, bilateral  . Cholelithiasis  without cholecystitis   . Chronic diastolic CHF (congestive heart failure) ([facility] 02/25/2009  . Chronic renal insufficiency, stage III (moderate) (HCC) 2017   Stage II/III (GFR around 60)  . Cirrhosis, nonalcoholic ([place] [address]  with splenomegaly.  CT 11/2017 w/ascites and persistent bilat pleural effusions  . 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 COPD (chronic obstructive pulmonary disease) ([facility]   . Coronary artery disease    chronically occluded LAD and diagonal with right to left collaterals, mild disease in the left circ and moderate disease in the mid RCA on medical management with Imdur, ASA, and Plavix.  . Dermatitis 05/2014  . DIABETES MELLITUS, TYPE II 06/24/2007   No diab retpthy as of 08/05/15 eye exam.  . Esophageal cancer ([facility] 04/2016   poorly differentiated carcinoma (Dr. Pyrtle--EGD).  CT C/A/P showed metastatic adenopathy in mediastinum and upper abdomen 05/09/16.  Tx plan is palliative radiation (completed 06/22/16), then palliative systemic chemotherapy (carbo+taxol) was started but as of 08/03/16 onc f/u this was held due to severe knee and ankle arthralgias.  Restarting as of 10/2015 (08/2016 CT showed some disease regression  . Esophageal cancer ([facility]    CT 12/2016 showed no residual esoph mass--plan to continue chemo.  Ongoing palliative chemo with carboplatin and taxol q 2 wks as of 03/2017 onc f/u (CT 12/2016 and  04/2017 showed no progression of dz).  Taking a 2 mo break from chemo as of 05/2017.  Pleural effusion-- improved with lasix but onc wanted him to get dx/therap thoracentesis-pt declined.  CT C/A/P no progression/recurr/mets 11/2017.  Marland Kitchen Essential hypertension 05/01/2007   Qualifier: Diagnosis of  By: Tiney Rouge CMA, Ellison Hughs     . History of kidney stones   . Hyperkalemia 12/2015   Decreased ACE-I by 50% in response, then potassium normalized.  Marland Kitchen HYPERLIPIDEMIA 06/24/2007  . Hypoxemia 01/27/2010  . Morbid obesity ([place])    . Myocardial infarction [facility])    pt states he was informed per MD that he has had one but pt was unaware   . Obesity hypoventilation syndrome (HCC)    oxygen 24/7 (2 liters [employer] as of 02/2016)  . On home oxygen therapy    Oxygen @ 2l/m nasally 24/7 hours  . OSA (obstructive sleep apnea)    not tested; pt scored 4 per stop bang tool results sent to PCP   . OSTEOARTHRITIS 05/01/2007  . Pancytopenia due to chemotherapy ([place]) 2018  . Pleural effusion on right 08/2017   ? malignant vs CHF: pt refuses diagnostic thoracentesis b/c he states he feels fine, wants to try diuretic first.  . Pleural effusion on right 09/2017   Thoracentesis  . Presbycusis of both ears 05/2015   [facility]  . Pruritic condition 05/2014   Allergist summer 2015, no new testing.  Marland Kitchen RBBB   . Visual field defect 05/10/2016   Per Dr. Melina Fiddler, O.D.: Rt, loss of inf/temp quad and some loss sup/temp.  ?CVA  ? Pituitary tumor? 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    Past Surgical History:  Procedure Laterality Date  . APPENDECTOMY    . BALLOON DILATION N/A 05/04/2016   Procedure: BALLOON DILATION;  Surgeon: Jerene Bears, MD;  Location: [facility];  Service: Gastroenterology;  Laterality: N/A;  . BIOPSY  03/19/2018   Procedure: BIOPSY;  Surgeon: Jackquline Denmark, MD;  Location: [facility];  Service: Endoscopy;;  . CARDIAC CATHETERIZATION    . CARPAL TUNNEL RELEASE Left   . CATARACT EXTRACTION W/PHACO Right 04/29/2013   Procedure: CATARACT EXTRACTION PHACO AND INTRAOCULAR LENS PLACEMENT (IOC);  Surgeon: Adonis Brook, MD;  Location: [facility];  Service: Ophthalmology;  Laterality: Right;  . CATARACT EXTRACTION, BILATERAL    . COLONOSCOPY W/ POLYPECTOMY  08/2011   Many polyps--all hyperplastic, severe diverticulosis, int hem.  [employer] hemoccult testing via [facility] 06/22/15 NEG  . ESOPHAGOGASTRODUODENOSCOPY N/A 05/04/2016   Procedure: ESOPHAGOGASTRODUODENOSCOPY (EGD);  Surgeon: Jerene Bears, MD;   Location: [facility];  Service: Gastroenterology;  Laterality: N/A;  . ESOPHAGOGASTRODUODENOSCOPY (EGD) WITH PROPOFOL N/A 03/19/2018   Gastric ulcers, esoph ulcer (radiation-induced?), bx-->path neg for malignancy or Barrett's.  GI plans repeat EGD 6-8 wks after 1st.   Procedure: ESOPHAGOGASTRODUODENOSCOPY (EGD) WITH PROPOFOL;  Surgeon: Jackquline Denmark, MD;  Location: [facility];  Service: Endoscopy;  Laterality: N/A;  . IR GENERIC HISTORICAL  10/03/2016   IR US GUIDE VASC ACCESS RIGHT 10/03/2016 Greggory Keen, MD [facility]  . IR GENERIC HISTORICAL  10/03/2016   IR FLUORO GUIDE PORT INSERTION RIGHT 10/03/2016 Greggory Keen, MD [facility]  . KNEE ARTHROSCOPY WITH MEDIAL MENISECTOMY Right 12/16/2014   Procedure: RIGHT KNEE ARTHROSCOPY WITH MEDIAL MENISECTOMY microfracture medial femoral condyle abrasion condroplasty medial femoral condyle lateral menisectomy;  Surgeon: Tobi Bastos, MD;  Location: [facility];  Service: Orthopedics;  Laterality: Right;  .  LEFT HEART CATHETERIZATION WITH CORONARY ANGIOGRAM N/A 10/26/2013   Procedure: LEFT HEART CATHETERIZATION WITH CORONARY ANGIOGRAM;  Surgeon: Jettie Booze, MD;  Location[facility] C[facility] LAB;  Service: Cardiovascular;  Laterality: N/A;  . LUMBAR [place] SURGERY  2001  . SHOULDER SURGERY Right    right fx  . TONSILLECTOMY    . TRANSTHORACIC ECHOCARDIOGRAM  03/2016   EF 55-60%, grade I DD, LAE  . WRIST SURGERY Right    right fx    Outpatient Medications Prior to Visit  Medication Sig Dispense Refill  . atorvastatin (LIPITOR) 80 MG tablet TAKE 1 TABLET BY MOUTH ONCE DAILY (Patient taking differently: TAKE 1 TABLET BY MOUTH ONCE DAILY IN THE EVENING) 90 tablet 3  . baclofen (LIORESAL) 10 MG tablet 1/2 - 1 tab po q8h prn muscle spasms (Patient taking differently: Take 5-10 mg by mouth every 8 (eight) hours as needed for muscle spasms. ) 30 each 1  . beta carotene w/minerals (OCUVITE) tablet Take 1 tablet by mouth daily.    . cetirizine (ZYRTEC) 10 MG  tablet Take 10 mg by mouth every evening.     . citalopram (CELEXA) 20 MG tablet TAKE 1 TABLET BY MOUTH ONCE DAILY 90 tablet 1  . clonazePAM (KLONOPIN) 1 MG tablet TAKE 1 TABLET BY MOUTH TWICE DAILY AS NEEDED FOR  ANXIETY 60 tablet 5  . clotrimazole-betamethasone (LOTRISONE) cream Apply to affected area twice daily (Patient taking differently: Apply 1 application topically 2 (two) times daily as needed (for infection). ) 45 g 2  . cromolyn (OPTICROM) 4 % ophthalmic solution Place 1 drop into both eyes. 3 to 4 times daily    . ezetimibe (ZETIA) 10 MG tablet TAKE 1 TABLET BY MOUTH ONCE DAILY (Patient taking differently: TAKE 1 TABLET BY MOUTH ONCE DAILY AT BEDTIME) 90 tablet 3  . furosemide (LASIX) 20 MG tablet Take 1 tablet (20 mg total) by mouth daily. 90 tablet 1  . gabapentin (NEURONTIN) 300 MG capsule TAKE ONE CAPSULE BY MOUTH THREE TIMES DAILY 270 capsule 3  . hydrOXYzine (ATARAX/VISTARIL) 25 MG tablet TAKE 1 TABLET BY MOUTH WITH SUPPER AND 1 AT BEDTIME FOR INSOMNIA 60 tablet 6  . Insulin Lispro (HUMALOG KWIKPEN) 200 UNIT/ML SOPN Inject 20 Units into the skin 2 (two) times daily with a meal. (Patient taking differently: Inject 24 Units into the skin 3 (three) times daily. ) 18 pen 6  . Insulin NPH, Human,, Isophane, (HUMULIN N) 100 UNIT/ML Kiwkpen 16 units every morning and 16 units every evening (Patient taking differently: Inject 16 Units into the skin 2 (two) times daily. ) 15 mL 3  . magnesium chloride (SLOW-MAG) 64 MG TBEC SR tablet Take 1 tablet (64 mg total) by mouth daily. 60 tablet 0  . metFORMIN (GLUCOPHAGE) 1000 MG tablet TAKE 1 TABLET BY MOUTH TWICE DAILY WITH MEALS 180 tablet 1  . Omega-3 Fatty Acids (FISH OIL) 1200 MG CAPS Take 2,400 mg by mouth 2 (two) times daily.    . ONE TOUCH ULTRA TEST test strip USE  STRIP TO CHECK GLUCOSE THREE TIMES DAILY AS  DIRECTED 100 each 11  . ONETOUCH DELICA LANCETS 50N MISC USE ONE  TO CHECK GLUCOSE THREE TIMES DAILY 100 each 11  . OXYGEN Inhale 2 L  into the lungs continuous.     . pantoprazole (PROTONIX) 40 MG tablet Take 1 tablet (40 mg total) by mouth 2 (two) times daily. 60 tablet 6  . aspirin EC 81 MG tablet Take 81 mg by mouth  daily.    . sucralfate (CARAFATE) 1 GM/10ML suspension Take 10 mLs (1 g total) by mouth 2 (two) times daily. (Patient not taking: Reported on 04/21/2018) 420 mL 0   Facility-Administered Medications Prior to Visit  Medication Dose Route Frequency Provider Last Rate Last Dose  . sodium chloride flush (NS) 0.9 % injection 10 mL  10 mL Intravenous PRN Truitt Merle, MD   10 mL at 04/16/17 1459  . sodium chloride flush (NS) 0.9 % injection 10 mL  10 mL Intravenous PRN Truitt Merle, MD   10 mL at 05/02/17 1725    No Known Allergies  ROS As per HPI  PE: Blood pressure 112/69, pulse 93, temperature 98.1 F (36.7 C), temperature source Oral, resp. rate 16, height '5\' 8"'  (1.727 m), weight 284 lb 8 oz (129 kg), SpO2 95 %.2L oxygen Gen: alert, sitting in WC, lucid thought and speech, NAD. CV: distant s1 and S1, RRR, no audible m/r/g. Ext: 1+ pitting edema bilat. No jaundice or pallor.  LABS:  Lab Results  Component Value Date   HGBA1C 5.4 04/24/2018      Chemistry      Component Value Date/Time   NA 138 04/15/2018 1024   NA 136 10/03/2017 1250   K 3.8 04/15/2018 1024   K 3.8 10/03/2017 1250   CL 96 (L) 04/15/2018 1024   CO2 33 (H) 04/15/2018 1024   CO2 29 10/03/2017 1250   BUN 14 04/15/2018 1024   BUN 13.9 10/03/2017 1250   CREATININE 1.02 04/15/2018 1024   CREATININE 1.10 03/28/2018 1540   CREATININE 1.2 10/03/2017 1250      Component Value Date/Time   CALCIUM 9.2 04/15/2018 1024   CALCIUM 9.3 10/03/2017 1250   ALKPHOS 68 04/15/2018 1024   ALKPHOS 68 10/03/2017 1250   AST 33 04/15/2018 1024   AST 64 (H) 10/03/2017 1250   ALT 23 04/15/2018 1024   ALT 50 10/03/2017 1250   BILITOT 0.6 04/15/2018 1024   BILITOT 0.77 10/03/2017 1250     Lab Results  Component Value Date   WBC 5.3 04/15/2018    HGB 9.8 (L) 04/15/2018   HCT 32.8 (L) 04/15/2018   MCV 95.3 04/15/2018   PLT 109 (L) 04/15/2018   Hep B and Hep C screening neg 03/30/18.  POC HbA1c today=5.4%  IMPRESSION AND PLAN:  1) DM 2: home monitoring showing pretty good control. POC HbA1c today excellent at 5.4%. With no hypoglycemia occurring I think I will keep his insulin dosing as they are currently, but will d/c his metformin (I don't think this med is doing much for him at this point).  2) Chronic hypoxic resp failure (OHS, COPD, chronic diast HF): stable. Continue 2L oxygen.  Continue lasix 27m qd.  3) IDA from bleeding gastric ulcers-->radiation induced?+ ASA/plavix use. No melena.  Most recent Hb 04/15/18 stable at 9.8. He'll continue to try to eat more iron-rich foods and take MVI with iron. Set for repeat EGD 04/28/18.  Spent 25 min with pt today, with >50% of this time spent in counseling and care coordination regarding the above problems.  An After Visit Summary was printed and given to the patient.  FOLLOW UP: Return in about 3 months (around 07/25/2018) for routine chronic illness f/u.  Signed:  PCrissie Sickles MD           04/24/2018

## 2018-04-25 ENCOUNTER — Other Ambulatory Visit: Payer: Self-pay

## 2018-04-25 LAB — LIPID PANEL
CHOLESTEROL TOTAL: 97 mg/dL — AB (ref 100–199)
Chol/HDL Ratio: 2.7 ratio (ref 0.0–5.0)
HDL: 36 mg/dL — ABNORMAL LOW (ref >39)
LDL Calculated: 32 mg/dL (ref 0–99)
Triglycerides: 144 mg/dL (ref 0–149)
VLDL CHOLESTEROL CAL: 29 mg/dL (ref 5–40)

## 2018-04-28 ENCOUNTER — Encounter (HOSPITAL_COMMUNITY): Payer: Self-pay

## 2018-04-28 ENCOUNTER — Ambulatory Visit (HOSPITAL_COMMUNITY): Payer: Medicare Other | Admitting: Certified Registered Nurse Anesthetist

## 2018-04-28 ENCOUNTER — Ambulatory Visit (HOSPITAL_COMMUNITY)
Admission: RE | Admit: 2018-04-28 | Discharge: 2018-04-28 | Disposition: A | Discharge status: Home / Self Care | Payer: Medicare Other | Source: Ambulatory Visit | Attending: Internal Medicine | Admitting: Internal Medicine

## 2018-04-28 ENCOUNTER — Encounter (HOSPITAL_COMMUNITY)
Admission: RE | Disposition: A | Discharge status: Home / Self Care | Payer: Self-pay | Source: Ambulatory Visit | Attending: Internal Medicine

## 2018-04-28 DIAGNOSIS — H5340 Unspecified visual field defects: Secondary | ICD-10-CM | POA: Insufficient documentation

## 2018-04-28 DIAGNOSIS — K766 Portal hypertension: Secondary | ICD-10-CM | POA: Insufficient documentation

## 2018-04-28 DIAGNOSIS — I8510 Secondary esophageal varices without bleeding: Secondary | ICD-10-CM | POA: Insufficient documentation

## 2018-04-28 DIAGNOSIS — E785 Hyperlipidemia, unspecified: Secondary | ICD-10-CM | POA: Diagnosis not present

## 2018-04-28 DIAGNOSIS — Z9981 Dependence on supplemental oxygen: Secondary | ICD-10-CM | POA: Insufficient documentation

## 2018-04-28 DIAGNOSIS — Z87891 Personal history of nicotine dependence: Secondary | ICD-10-CM | POA: Diagnosis not present

## 2018-04-28 DIAGNOSIS — Z6841 Body Mass Index (BMI) 40.0 and over, adult: Secondary | ICD-10-CM | POA: Diagnosis not present

## 2018-04-28 DIAGNOSIS — I252 Old myocardial infarction: Secondary | ICD-10-CM | POA: Insufficient documentation

## 2018-04-28 DIAGNOSIS — Z9842 Cataract extraction status, left eye: Secondary | ICD-10-CM | POA: Diagnosis not present

## 2018-04-28 DIAGNOSIS — Z79899 Other long term (current) drug therapy: Secondary | ICD-10-CM | POA: Insufficient documentation

## 2018-04-28 DIAGNOSIS — Z8719 Personal history of other diseases of the digestive system: Secondary | ICD-10-CM

## 2018-04-28 DIAGNOSIS — I2510 Atherosclerotic heart disease of native coronary artery without angina pectoris: Secondary | ICD-10-CM | POA: Diagnosis not present

## 2018-04-28 DIAGNOSIS — Z86010 Personal history of colonic polyps: Secondary | ICD-10-CM | POA: Diagnosis not present

## 2018-04-28 DIAGNOSIS — I130 Hypertensive heart and chronic kidney disease with heart failure and stage 1 through stage 4 chronic kidney disease, or unspecified chronic kidney disease: Secondary | ICD-10-CM | POA: Diagnosis not present

## 2018-04-28 DIAGNOSIS — Z833 Family history of diabetes mellitus: Secondary | ICD-10-CM | POA: Insufficient documentation

## 2018-04-28 DIAGNOSIS — J449 Chronic obstructive pulmonary disease, unspecified: Secondary | ICD-10-CM | POA: Diagnosis not present

## 2018-04-28 DIAGNOSIS — Z87442 Personal history of urinary calculi: Secondary | ICD-10-CM | POA: Diagnosis not present

## 2018-04-28 DIAGNOSIS — I4510 Unspecified right bundle-branch block: Secondary | ICD-10-CM | POA: Insufficient documentation

## 2018-04-28 DIAGNOSIS — N183 Chronic kidney disease, stage 3 (moderate): Secondary | ICD-10-CM | POA: Diagnosis not present

## 2018-04-28 DIAGNOSIS — K7469 Other cirrhosis of liver: Secondary | ICD-10-CM | POA: Diagnosis not present

## 2018-04-28 DIAGNOSIS — I8500 Esophageal varices without bleeding: Secondary | ICD-10-CM | POA: Diagnosis not present

## 2018-04-28 DIAGNOSIS — K3189 Other diseases of stomach and duodenum: Secondary | ICD-10-CM | POA: Diagnosis not present

## 2018-04-28 DIAGNOSIS — Z8501 Personal history of malignant neoplasm of esophagus: Secondary | ICD-10-CM

## 2018-04-28 DIAGNOSIS — E1122 Type 2 diabetes mellitus with diabetic chronic kidney disease: Secondary | ICD-10-CM | POA: Diagnosis not present

## 2018-04-28 DIAGNOSIS — K922 Gastrointestinal hemorrhage, unspecified: Secondary | ICD-10-CM | POA: Diagnosis not present

## 2018-04-28 DIAGNOSIS — M1990 Unspecified osteoarthritis, unspecified site: Secondary | ICD-10-CM | POA: Diagnosis not present

## 2018-04-28 DIAGNOSIS — Z89022 Acquired absence of left finger(s): Secondary | ICD-10-CM | POA: Insufficient documentation

## 2018-04-28 DIAGNOSIS — Z794 Long term (current) use of insulin: Secondary | ICD-10-CM | POA: Insufficient documentation

## 2018-04-28 DIAGNOSIS — K2211 Ulcer of esophagus with bleeding: Secondary | ICD-10-CM

## 2018-04-28 DIAGNOSIS — Z9841 Cataract extraction status, right eye: Secondary | ICD-10-CM | POA: Insufficient documentation

## 2018-04-28 DIAGNOSIS — E875 Hyperkalemia: Secondary | ICD-10-CM | POA: Diagnosis not present

## 2018-04-28 DIAGNOSIS — E119 Type 2 diabetes mellitus without complications: Secondary | ICD-10-CM

## 2018-04-28 DIAGNOSIS — I5032 Chronic diastolic (congestive) heart failure: Secondary | ICD-10-CM | POA: Insufficient documentation

## 2018-04-28 DIAGNOSIS — Z8249 Family history of ischemic heart disease and other diseases of the circulatory system: Secondary | ICD-10-CM | POA: Insufficient documentation

## 2018-04-28 DIAGNOSIS — Z8711 Personal history of peptic ulcer disease: Secondary | ICD-10-CM | POA: Diagnosis present

## 2018-04-28 DIAGNOSIS — Z811 Family history of alcohol abuse and dependence: Secondary | ICD-10-CM | POA: Insufficient documentation

## 2018-04-28 DIAGNOSIS — D631 Anemia in chronic kidney disease: Secondary | ICD-10-CM | POA: Insufficient documentation

## 2018-04-28 DIAGNOSIS — IMO0001 Reserved for inherently not codable concepts without codable children: Secondary | ICD-10-CM

## 2018-04-28 HISTORY — PX: ESOPHAGOGASTRODUODENOSCOPY (EGD) WITH PROPOFOL: SHX5813

## 2018-04-28 LAB — GLUCOSE, CAPILLARY: Glucose-Capillary: 167 mg/dL — ABNORMAL HIGH (ref 70–99)

## 2018-04-28 SURGERY — ESOPHAGOGASTRODUODENOSCOPY (EGD) WITH PROPOFOL
Anesthesia: Monitor Anesthesia Care

## 2018-04-28 MED ORDER — LIDOCAINE 2% (20 MG/ML) 5 ML SYRINGE
INTRAMUSCULAR | Status: DC | PRN
  Administered 2018-04-28: 40 mg via INTRAVENOUS

## 2018-04-28 MED ORDER — PROPOFOL 10 MG/ML IV BOLUS
INTRAVENOUS | Status: AC
  Filled 2018-04-28: qty 40

## 2018-04-28 MED ORDER — PROPOFOL 10 MG/ML IV BOLUS
INTRAVENOUS | Status: DC | PRN
  Administered 2018-04-28 (×6): 20 mg via INTRAVENOUS

## 2018-04-28 MED ORDER — LACTATED RINGERS IV SOLN
INTRAVENOUS | Status: DC
  Administered 2018-04-28: 12:00:00 via INTRAVENOUS

## 2018-04-28 MED ORDER — HEPARIN SOD (PORK) LOCK FLUSH 100 UNIT/ML IV SOLN
INTRAVENOUS | Status: AC
  Filled 2018-04-28: qty 5

## 2018-04-28 MED ORDER — LACTATED RINGERS IV SOLN: INTRAVENOUS | Status: DC

## 2018-04-28 MED ORDER — SODIUM CHLORIDE 0.9 % IV SOLN: INTRAVENOUS | Status: DC

## 2018-04-28 MED ORDER — HEPARIN SOD (PORK) LOCK FLUSH 100 UNIT/ML IV SOLN
500.0000 [IU] | Freq: Once | INTRAVENOUS | Status: AC
  Administered 2018-04-28: 500 [IU] via INTRAVENOUS

## 2018-04-28 SURGICAL SUPPLY — 14 items

## 2018-04-28 NOTE — Interval H&P Note (Signed)
History and Physical Interval Note: Patient with history of gastroesophageal carcinoma metastatic to lymph nodes.  Recent upper GI bleed in May 2019 from ulceration in distal esophagus.  Biopsies at that time by Dr. Lyndel Safe were benign. Plan repeat upper endoscopy today for surveillance and to ensure healing Clinically he is doing well.  No further melena.  No chest or abdominal pain.  No dysphagia. He saw Dr. Burr Medico recently in follow-up with next visit scheduled for Aug 2019. The nature of the procedure, as well as the risks, benefits, and alternatives were carefully and thoroughly reviewed with the patient. Ample time for discussion and questions allowed. The patient understood, was satisfied, and agreed to proceed.    04/28/2018 11:29 AM  [name]  has presented today for surgery, with the diagnosis of esophageal ulcer/db  The various methods of treatment have been discussed with the patient and family. After consideration of risks, benefits and other options for treatment, the patient has consented to  Procedure(s): ESOPHAGOGASTRODUODENOSCOPY (EGD) WITH PROPOFOL (N/A) as a surgical intervention .  The patient's history has been reviewed, patient examined, no change in status, stable for surgery.  I have reviewed the patient's chart and labs.  Questions were answered to the patient's satisfaction.     Lajuan Lines Marcellius Montagna

## 2018-04-28 NOTE — Discharge Instructions (Signed)
Esophagogastroduodenoscopy, Care After °Refer to this sheet in the next few weeks. These instructions provide you with information about caring for yourself after your procedure. Your health care provider may also give you more specific instructions. Your treatment has been planned according to current medical practices, but problems sometimes occur. Call your health care provider if you have any problems or questions after your procedure. °What can I expect after the procedure? °After the procedure, it is common to have: °· A sore throat. °· Nausea. °· Bloating. °· Dizziness. °· Fatigue. ° °Follow these instructions at home: °· Do not eat or drink anything until the numbing medicine (local anesthetic) has worn off and your gag reflex has returned. You will know that the local anesthetic has worn off when you can swallow comfortably. °· Do not drive for 24 hours if you received a medicine to help you relax (sedative). °· If your health care provider took a tissue sample for testing during the procedure, make sure to get your test results. This is your responsibility. Ask your health care provider or the department performing the test when your results will be ready. °· Keep all follow-up visits as told by your health care provider. This is important. °Contact a health care provider if: °· You cannot stop coughing. °· You are not urinating. °· You are urinating less than usual. °Get help right away if: °· You have trouble swallowing. °· You cannot eat or drink. °· You have throat or chest pain that gets worse. °· You are dizzy or light-headed. °· You faint. °· You have nausea or vomiting. °· You have chills. °· You have a fever. °· You have severe abdominal pain. °· You have black, tarry, or bloody stools. °This information is not intended to replace advice given to you by your health care provider. Make sure you discuss any questions you have with your health care provider. °Document Released: 10/01/2012 Document  Revised: 03/22/2016 Document Reviewed: 09/08/2015 °Elsevier Interactive Patient Education © 2018 [employer] ° °

## 2018-04-28 NOTE — Anesthesia Procedure Notes (Signed)
Procedure Name: General with mask airway Date/Time: 04/28/2018 11:47 AM Performed by: Cynda Familia, CRNA Placement Confirmation: positive ETCO2 and breath sounds checked- equal and bilateral Dental Injury: Teeth and Oropharynx as per pre-operative assessment

## 2018-04-28 NOTE — Transfer of Care (Signed)
Immediate Anesthesia Transfer of Care Note  Patient: [name]  Procedure(s) Performed: ESOPHAGOGASTRODUODENOSCOPY (EGD) WITH PROPOFOL (N/A )  Patient Location: PACU and Endoscopy Unit  Anesthesia Type:MAC  Level of Consciousness: awake and alert   Airway & Oxygen Therapy: Patient Spontanous Breathing and Patient connected to nasal cannula oxygen  Post-op Assessment: Report given to RN and Post -op Vital signs reviewed and stable  Post vital signs: Reviewed and stable  Last Vitals:  Vitals Value Taken Time  BP    Temp    Pulse    Resp    SpO2      Last Pain:  Vitals:   04/28/18 1058  TempSrc: Oral  PainSc: 2          Complications: No apparent anesthesia complications

## 2018-04-28 NOTE — Op Note (Signed)
[facility] Patient Name: [name] Procedure Date: 04/28/2018 MRN: [id number] Attending MD: Jerene Bears , MD Date of Birth: [birth year] CSN: [id number] Age: 72 Admit Type: Outpatient Procedure:                Upper GI endoscopy Indications:              Recent gastrointestinal bleeding (May 2019) due to                            distal esophageal ulceration, follow-up of                            esophageal ulceration in the setting of personal                            history of GE junction carcinoma Providers:                Lajuan Lines. Hilarie Fredrickson, MD, Burtis Junes, RN, Charolette Child,                            Technician, Glenis Smoker, CRNA Referring MD:             Truitt Merle Medicines:                Monitored Anesthesia Care Complications:            No immediate complications. Estimated Blood Loss:     Estimated blood loss: none. Procedure:                Pre-Anesthesia Assessment:                           - Prior to the procedure, a History and Physical                            was performed, and patient medications and                            allergies were reviewed. The patient's tolerance of                            previous anesthesia was also reviewed. The risks                            and benefits of the procedure and the sedation                            options and risks were discussed with the patient.                            All questions were answered, and informed consent                            was obtained. Prior Anticoagulants: The patient has  taken no previous anticoagulant or antiplatelet                            agents. ASA Grade Assessment: III - A patient with                            severe systemic disease. After reviewing the risks                            and benefits, the patient was deemed in                            satisfactory condition to undergo the procedure.        After obtaining informed consent, the endoscope was                            passed under direct vision. Throughout the                            procedure, the patient's blood pressure, pulse, and                            oxygen saturations were monitored continuously. The                            EG-[id number] ([id number]) scope was introduced through the                            mouth, and advanced to the second part of duodenum.                            The upper GI endoscopy was accomplished without                            difficulty. The patient tolerated the procedure                            well. Scope In: Scope Out: Findings:      Grade I, small (< 5 mm) varices were found in the lower third of the       esophagus. One column seen without stigmata.      The exam of the esophagus was otherwise normal. The previously seen       distal esophageal ulceration (biopsied in May 2019) has healed.      Mild portal hypertensive gastropathy was found in the cardia and in the       gastric fundus.      The exam of the stomach was otherwise normal.      The examined duodenum was normal. 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                           -  Previously seen esophageal ulceration has healed.                            No evidence for GE junction tumor.                           - Mild portal hypertensive gastropathy.                           - Normal examined duodenum.                           - No specimens collected. Moderate Sedation:      N/A Recommendation:           - Patient has a contact number available for                            emergencies. The signs and symptoms of potential                            delayed complications were discussed with the                            patient. Return to normal activities tomorrow.                            Written discharge instructions  were provided to the                            patient.                           - Resume previous diet.                           - Continue present medications.                           - Beta blocker (non-selective) should be considered                            in the setting of cirrhosis with small varices,                            though I am uncertainly with chronic lung disease                            if this could be tolerated. Procedure Code(s):        --- Professional ---                           [phone], Esophagogastroduodenoscopy, flexible,                            transoral; diagnostic, including collection of  specimen(s) by brushing or washing, when performed                            (separate procedure) Diagnosis Code(s):        --- Professional ---                           I85.00, Esophageal varices without bleeding                           K76.6, Portal hypertension                           K31.89, Other diseases of stomach and duodenum                           K92.2, Gastrointestinal hemorrhage, unspecified CPT copyright 2017 [employer]. All rights reserved. The codes documented in this report are preliminary and upon coder review may  be revised to meet current compliance requirements. Jerene Bears, MD 04/28/2018 12:06:59 PM This report has been signed electronically. Number of Addenda: 0

## 2018-04-28 NOTE — Anesthesia Procedure Notes (Signed)
Date/Time: 04/28/2018 11:52 AM Performed by: Cynda Familia, CRNA Oxygen Delivery Method: Nasal cannula Placement Confirmation: positive ETCO2 and breath sounds checked- equal and bilateral Dental Injury: Teeth and Oropharynx as per pre-operative assessment

## 2018-04-28 NOTE — Anesthesia Preprocedure Evaluation (Signed)
Anesthesia Evaluation  Patient identified by MRN, date of birth, ID band Patient awake    Reviewed: Allergy & Precautions, NPO status , Patient's Chart, lab work & pertinent test results  Airway Mallampati: III  TM Distance: >3 FB Neck ROM: Full    Dental  (+) Edentulous Lower, Edentulous Upper   Pulmonary asthma , COPD,  oxygen dependent, former smoker,    Pulmonary exam normal breath sounds clear to auscultation       Cardiovascular hypertension, + CAD, + Past MI and +CHF  Normal cardiovascular exam Rhythm:Regular Rate:Normal  ECG: LAD, RBBB, rate 101  Sees cardiologist (Turner)  Normal ECHO in 2017   Neuro/Psych negative neurological ROS  negative psych ROS   GI/Hepatic Neg liver ROS, PUD, Esophageal cancer s/p chemo and rads   Endo/Other  diabetes, Insulin DependentMorbid obesity  Renal/GU Renal disease     Musculoskeletal negative musculoskeletal ROS (+)   Abdominal (+) + obese,   Peds  Hematology  (+) anemia , HLD   Anesthesia Other Findings esophageal ulcer/db  Reproductive/Obstetrics                             Anesthesia Physical Anesthesia Plan  ASA: IV  Anesthesia Plan: MAC   Post-op Pain Management:    Induction: Intravenous  PONV Risk Score and Plan: 1 and Propofol infusion and Treatment may vary due to age or medical condition  Airway Management Planned: Nasal Cannula  Additional Equipment:   Intra-op Plan:   Post-operative Plan:   Informed Consent: I have reviewed the patients History and Physical, chart, labs and discussed the procedure including the risks, benefits and alternatives for the proposed anesthesia with the patient or authorized representative who has indicated his/her understanding and acceptance.   Dental advisory given  Plan Discussed with: CRNA  Anesthesia Plan Comments:         Anesthesia Quick Evaluation

## 2018-04-28 NOTE — Anesthesia Procedure Notes (Signed)
Procedure Name: MAC Date/Time: 04/28/2018 11:41 AM Performed by: Cynda Familia, CRNA Pre-anesthesia Checklist: Patient identified, Emergency Drugs available, Suction available, Patient being monitored and Timeout performed Patient Re-evaluated:Patient Re-evaluated prior to induction Oxygen Delivery Method: Nasal cannula Placement Confirmation: positive ETCO2 and breath sounds checked- equal and bilateral Dental Injury: Teeth and Oropharynx as per pre-operative assessment

## 2018-04-29 ENCOUNTER — Encounter (HOSPITAL_COMMUNITY): Payer: Self-pay | Admitting: Internal Medicine

## 2018-04-29 NOTE — Anesthesia Postprocedure Evaluation (Signed)
Anesthesia Post Note  Patient: [name]  Procedure(s) Performed: ESOPHAGOGASTRODUODENOSCOPY (EGD) WITH PROPOFOL (N/A )     Patient location during evaluation: PACU Anesthesia Type: MAC Level of consciousness: awake and alert Pain management: pain level controlled Vital Signs Assessment: post-procedure vital signs reviewed and stable Respiratory status: spontaneous breathing, nonlabored ventilation, respiratory function stable and patient connected to nasal cannula oxygen Cardiovascular status: stable and blood pressure returned to baseline Postop Assessment: no apparent nausea or vomiting Anesthetic complications: no    Last Vitals:  Vitals:   04/28/18 1207 04/28/18 1215  BP: 107/61 113/60  Pulse: 76 77  Resp: 14 18  Temp: 36.5 C   SpO2: 91% 91%    Last Pain:  Vitals:   04/28/18 1215  TempSrc:   PainSc: 0-No pain                 Ryan P Ellender

## 2018-05-07 DIAGNOSIS — J449 Chronic obstructive pulmonary disease, unspecified: Secondary | ICD-10-CM | POA: Diagnosis not present

## 2018-05-08 ENCOUNTER — Other Ambulatory Visit: Payer: Self-pay | Admitting: Family Medicine

## 2018-05-13 ENCOUNTER — Telehealth: Payer: Self-pay | Admitting: Internal Medicine

## 2018-05-13 NOTE — Telephone Encounter (Signed)
Pt states he noticed some blood on the toilet tissue when he wiped today. Reports the stool is brown and appears normal. He is concerned that he may be bleeding again. Pt wanted to know if Dr. Hilarie Fredrickson thinks he needs to go back on carafate. Please advise.

## 2018-05-13 NOTE — Telephone Encounter (Signed)
Continue PPI and okay to resume Carafate 3 times daily before meals and at bedtime If he has any further bleeding he needs to let us know and would need to be seen

## 2018-05-14 MED ORDER — SUCRALFATE 1 GM/10ML PO SUSP: 1.0000 g | Freq: Three times a day (TID) | ORAL | 0 refills | Status: DC

## 2018-05-14 NOTE — Telephone Encounter (Signed)
Pt aware and new script sent to pharmacy for Carafate.

## 2018-05-18 ENCOUNTER — Other Ambulatory Visit: Payer: Self-pay | Admitting: Family Medicine

## 2018-05-19 ENCOUNTER — Other Ambulatory Visit: Payer: Self-pay | Admitting: Family Medicine

## 2018-06-02 ENCOUNTER — Other Ambulatory Visit: Payer: Self-pay | Admitting: Family Medicine

## 2018-06-07 DIAGNOSIS — J449 Chronic obstructive pulmonary disease, unspecified: Secondary | ICD-10-CM | POA: Diagnosis not present

## 2018-06-14 NOTE — Progress Notes (Signed)
[facility]  [phone] [phone]  Clinic Follow up Note   Patient Care Team: Tammi Sou, MD as PCP - General (Family Medicine) Harold Hedge, Darrick Grinder, MD as Consulting Physician (Allergy and Immunology) Shirley Muscat Loreen Freud, MD as Consulting Physician (Optometry) Sueanne Margarita, MD as Consulting Physician (Cardiology) Pyrtle, Lajuan Lines, MD as Consulting Physician (Gastroenterology) Latanya Maudlin, MD as Consulting Physician (Orthopedic Surgery) Jodi Marble, MD as Consulting Physician (Otolaryngology) Truitt Merle, MD as Consulting Physician (Hematology) Kyung Rudd, MD as Consulting Physician (Radiation Oncology)   Date of Service: 06/18/2018     CHIEF COMPLAINTS:  Follow up GE junction cancer  Oncology History   Cancer of cardio-esophageal junction [facility])   Staging form: Stomach, AJCC 7th Edition     Clinical stage from 05/04/2016: Stage IV (TX, N2, M1) - Signed by Truitt Merle, MD on 05/18/2016        Cancer of cardio-esophageal junction ([place])   05/04/2016 Initial Diagnosis    Cancer of cardio-esophageal junction ([place])    05/04/2016 Procedure    EGD showed a large ulcerating mass with no active bleeding at the gastroesophageal junction extending into the gastric cardia. The mass was not obstructing and partially circumferential, extends approximately 5 cm. Nonbleeding erosive gastropathy.    05/04/2016 Initial Biopsy    Esophageal gastric junction biopsy showed a poorly differentiated carcinoma underlying the squamous mucosa. There is lymphovascular invasion. No Intestinal metaplasia. IHC weakly positive CK5/6, p63 (-), favor squamous.      05/09/2016 Imaging    CT CAP w contrast showed mild wall thickening involving the distal esophagus and proximal stomach compatible with known cancer, enlarged mediastinal and upper abdominal lymph nodes are highly suspicious for metastatic adenopathy, propable liver cirrhosis.    06/04/2016 - 06/22/2016  Radiation Therapy    palliative radiation to esophageal cancer     07/06/2016 - 06/20/2017 Chemotherapy    chemotherapy with weekly carboplatin and taxol, started on 07/06/2016, held 08/21/16-09/28/2016 due to hospitalization, changed to every 2 weeks from 11/30/2016. Chemo stopped due to no evidence of disease on restaging scan.    07/30/2016 Pathology Results    PD-L1 negative expression    08/28/2016 - 08/31/2016 Hospital Admission    The patient was admitted to 4 severe hypoglycemia, dehydration, and acute renal failure. Infection workup was negative. He recovered well with supportive care. His insulin and hypertension medication was held on discharge, except insulin sliding scale.    09/24/2016 Imaging    CT CAP W CONTRAST 09/26/2016 IMPRESSION: Decreased masslike soft tissue prominence at gastroesophageal junction, consistent with decreased size of primary gastroesophageal junction carcinoma. Resolution of paraesophageal and gastrohepatic ligament lymphadenopathy since prior exam. Other sub-cm mediastinal lymph nodes show little or no significant change. Stable indeterminate sub-cm low-attenuation lesion in the liver dome. Probable hepatic cirrhosis. Recommend continued attention on follow-up CT. No new or progressive metastatic disease identified. Cholelithiasis.  No radiographic evidence of cholecystitis. Colonic diverticulosis. No radiographic evidence of diverticulitis. Aortic atherosclerosis and three-vessel coronary artery calcification.    01/07/2017 Imaging    CT CAP w contrast 1. No residual esophageal mass or adenopathy. 2. New nodular airspace opacification in the left lower lobe. Continued attention on followup exams is warranted as metastatic disease cannot be definitively excluded. 3. Cirrhosis. 4. Trace left pleural effusion. 5. Aortic atherosclerosis (ICD10-170.0). Coronary artery calcification. 6. Cholelithiasis. 7. Mild basilar predominant subpleural reticular  densities. Difficult to exclude interstitial lung disease.    04/29/2017 Imaging    CT CAP  w contrast  IMPRESSION: 1. No evidence of metastatic disease. 2. Nodular airspace disease previously seen in the left lower lobe has resolved in the interval. 3. Tiny right pleural effusion, new. Small left pleural effusion, slightly increased. 4. Cirrhosis with splenomegaly. 5.  Aortic atherosclerosis (ICD10-170.0).    09/05/2017 Imaging    CT CAP W Contrast 09/05/17  IMPRESSION: 1. Currently no esophageal mass is visible, nor is there significant mediastinal adenopathy. There is hepatic cirrhosis but no compelling findings of hepatic metastatic disease. 2. Large right pleural effusion is increased from the prior exam. Small stable left pleural effusion. 3. Small but increased amount of ascites and mesenteric edema compared to prior. 4. Other imaging findings of potential clinical significance: Aortic Atherosclerosis (ICD10-I70.0). Coronary atherosclerosis. The markedly severe right glenohumeral arthropathy. Thoracic and lumbar spondylosis with lower lumbar multilevel impingement, and notable intervertebral spurring at T12-L1. Cholelithiasis. Degenerative arthropathy of both hips.     10/21/2017 Imaging    Chest X-Ray IMPRESSION: Small bilateral effusions, decreased on the right since prior CT.    12/05/2017 Imaging    CT CAP W Contrast 12/05/17 IMPRESSION: 1. Stable exam. No esophageal mass or mediastinal adenopathy identified. No evidence for metastatic disease to the chest, abdomen and pelvis. 2. Cirrhosis with ascites. 3. Persistent bilateral pleural effusions, right greater than left. 4. Aortic Atherosclerosis (ICD10-I70.0). Three vessel coronary artery atherosclerotic calcifications noted. 5. Thoracic and lumbar spondylosis and advanced right glenohumeral joint arthropathy. 6. Gallstones.    03/18/2018 - 03/19/2018 Hospital Admission    Admit date: 03/18/18  Admission diagnosis:  GI Bleeding from grastic ulcer bleed Additional comments: Had Upper Endoscopy, and blood trasnfusion on 03/19/18. He is no longer on aspirin.     03/19/2018 Procedure    Upper Endoscopy by Dr. Hilarie Fredrickson on 03/19/18  IMPRESSION - Distal esophageal ulcer. Biopsied (could represent radiation-induced ulcers, rule out recurrence) - Non-bleeding erosive gastropathy. Biopsied. And benign - Normal examined duodenum.   Diagnosis 1. Stomach, biopsy - MILD CHRONIC GASTRITIS WITHOUT ACTIVITY - NO H. PYLORI OR INTESTINAL METAPLASIA IDENTIFIED - SEE COMMENT 2. Esophagus, biopsy, distal - SQUAMOCOLUMNAR JUNCTION WITH CHRONIC INFLAMMATION AND FOCAL ULCERATION - NO INTESTINAL METAPLASIA OR MALIGNANCY IDENTIFIED Microscopic Comment 1. A Warthin-Starry stain is performed to determine the possibility of the presence of Helicobacter pylori. The Warthin-Starry stain is negative for organisms morphologically consistent with Helicobacter pylori.    04/28/2018 Procedure    04/28/2018 Upper Endoscopy - Grade I and small (< 5 mm) distal esophageal varices. - Previously seen esophageal ulceration has healed. No evidence for GE junction tumor. - Mild portal hypertensive gastropathy. - Normal examined duodenum. - No specimens collected.    06/16/2018 Imaging    06/16/2018 CT CAP IMPRESSION: 1. New mild upper right paratracheal adenopathy, nonspecific, cannot exclude early recurrent metastatic disease. 2. No additional potential findings of metastatic disease in the chest, abdomen or pelvis. No discrete CT findings of local tumor recurrence at the esophagogastric junction. 3. Moderate dependent right pleural effusion, mildly increased. Small dependent left pleural effusion, stable. 4. Hepatic cirrhosis. Small volume ascites. 5.  Aortic Atherosclerosis (ICD10-I70.0).     HISTORY OF PRESENTING ILLNESS:  [name] 72 y.o. male is here because of His reason that diagnosed GE junction carcinoma. He is a  comment of by his wife and daughter to our multidisciplinary chart clinic today.  He has been having progressive dysphagia and odynophagia for 2 month, he has been eating soft diet only in the past one week. He has pain in  the mid chest and epigastric area only when he eats, with burning sensation, no nausea, abdominal bloating, or other discomfort. His appetite has remained well, no weight loss,   He has multiple medical problems, especially heart failure, he has been on oxygen continuously for 5-6 years. He is morbidly obese, has diabetes, hypertension, mild neuropathy, OSA etc. He has very sedentary life style, he sits in the chair and watch TV most of time during the day. He only comes out for shopping for a few times a week. He uses a [employer], he can walk for a few hundreds feet before he has to stop to catch his breasts. He denies cough or sputum production, no GI discomfort, he has been having mild diarrhea, loose stool, twice daily, no melena or hematochezia.  CURRENT THERAPY: Observation  INTERIM HISTORY: [name] returns for follow-up of his esophogeal cancer. He is here with his grandson. He is on a wheelchair and uses O2 cannula. He has no new complaints today. He is eating well and is using 2Ls O2. He follows up with his cardiologist.   MEDICAL HISTORY:  Past Medical History:  Diagnosis Date  . Acute upper GI bleed 02/2018   Transfused 4 U pRBCs-->EGD showed an esoph ulcer and some gastric ulcers (?radiation-induced), path showed no malignancy or Barrett's.  GI plans on repeat EGD 6-8 weeks after the 03/19/18 EGD.  Marland Kitchen Asthma    as a child  . CAD in native artery    a. cardiac cath 09/2013 showed severely diseased mLAD and diagonal, mild LCx disease, moderate RCA disease, LVEDP 20, EF 50%.  . Cataract    multiple types, bilateral  . Cholelithiasis without cholecystitis   . Chronic diastolic CHF (congestive heart failure) ([place]) 02/25/2009  . Chronic renal  insufficiency, stage III (moderate) (H[facility]) 2017   Stage II/III (GFR around 60)  . 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  CT 11/2017 w/ascites and persistent bilat pleural effusions  . 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 COPD (chronic obstructive pulmonary disease) ([place])   . Coronary artery disease    chronically occluded LAD and diagonal with right to left collaterals, mild disease in the left circ and moderate disease in the mid RCA on medical management with Imdur, ASA, and Plavix.  . Dermatitis 05/2014  . DIABETES MELLITUS, TYPE II 06/24/2007   No diab retpthy as of 08/05/15 eye exam.  . 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 Pyrtle--EGD).  CT C/A/P showed metastatic adenopathy in mediastinum and upper abdomen 05/09/16.  Tx plan is palliative radiation (completed 06/22/16), then palliative systemic chemotherapy (carbo+taxol) was started but as of 08/03/16 onc f/u this was held due to severe knee and ankle arthralgias.  Restarting as of 10/2015 (08/2016 CT showed some disease regression  . Esophageal cancer ([place])    CT 12/2016 showed no residual esoph mass--plan to continue chemo.  Ongoing palliative chemo with carboplatin and taxol q 2 wks as of 03/2017 onc f/u (CT 12/2016 and 04/2017 showed no progression of dz).  Taking a 2 mo break from chemo as of 05/2017.  Pleural effusion-- improved with lasix but onc wanted him to get dx/therap thoracentesis-pt declined.  CT C/A/P no progression/recurr/mets 11/2017.  Marland Kitchen Essential hypertension 05/01/2007   Qualifier: Diagnosis of  By: Tiney Rouge CMA, Ellison Hughs     . History of kidney stones   . Hyperkalemia  12/2015   Decreased ACE-I by 50% in response, then potassium normalized.  Marland Kitchen HYPERLIPIDEMIA 06/24/2007   03/2018 lipids at goal  . Hypoxemia 01/27/2010  . Morbid obesity ([place])   . Myocardial infarction [facility])    pt states he was informed per MD that he has  had one but pt was unaware   . Obesity hypoventilation syndrome (H[facility])    oxygen 24/7 (2 liters [place] as of 02/2016)  . On home oxygen therapy    Oxygen @ 2l/m nasally 24/7 hours  . OSA (obstructive sleep apnea)    not tested; pt scored 4 per stop bang tool results sent to PCP   . OSTEOARTHRITIS 05/01/2007  . Pancytopenia due to chemotherapy ([place]) 2018  . Pleural effusion on right 08/2017   ? malignant vs CHF: pt refuses diagnostic thoracentesis b/c he states he feels fine, wants to try diuretic first.  . Pleural effusion on right 09/2017   Thoracentesis  . Presbycusis of both ears 05/2015   [facility]  . Pruritic condition 05/2014   Allergist summer 2015, no new testing.  Marland Kitchen RBBB   . Visual field defect 05/10/2016   Per Dr. Melina Fiddler, O.D.: Rt, loss of inf/temp quad and some loss sup/temp.  ?CVA  ? Pituitary tumor? 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    SURGICAL HISTORY: Past Surgical History:  Procedure Laterality Date  . APPENDECTOMY    . BALLOON DILATION N/A 05/04/2016   Procedure: BALLOON DILATION;  Surgeon: Jerene Bears, MD;  Location: [facility];  Service: Gastroenterology;  Laterality: N/A;  . BIOPSY  03/19/2018   Procedure: BIOPSY;  Surgeon: Jackquline Denmark, MD;  Location: [facility];  Service: Endoscopy;;  . CARDIAC CATHETERIZATION    . CARPAL TUNNEL RELEASE Left   . CATARACT EXTRACTION W/PHACO Right 04/29/2013   Procedure: CATARACT EXTRACTION PHACO AND INTRAOCULAR LENS PLACEMENT (IOC);  Surgeon: Adonis Brook, MD;  Location: [facility];  Service: Ophthalmology;  Laterality: Right;  . CATARACT EXTRACTION, BILATERAL    . COLONOSCOPY W/ POLYPECTOMY  08/2011   Many polyps--all hyperplastic, severe diverticulosis, int hem.  [employer] hemoccult testing via [facility] 06/22/15 NEG  . ESOPHAGOGASTRODUODENOSCOPY N/A 05/04/2016   Procedure: ESOPHAGOGASTRODUODENOSCOPY (EGD);  Surgeon: Jerene Bears, MD;  Location: [facility];  Service: Gastroenterology;  Laterality:  N/A;  . ESOPHAGOGASTRODUODENOSCOPY (EGD) WITH PROPOFOL N/A 03/19/2018   Gastric ulcers, esoph ulcer (radiation-induced?), bx-->path neg for malignancy or Barrett's.  GI plans repeat EGD 6-8 wks after 1st.   Procedure: ESOPHAGOGASTRODUODENOSCOPY (EGD) WITH PROPOFOL;  Surgeon: Jackquline Denmark, MD;  [facility];  Service: Endoscopy;  Laterality: N/A;  . ESOPHAGOGASTRODUODENOSCOPY (EGD) WITH PROPOFOL N/A 04/28/2018   Procedure: ESOPHAGOGASTRODUODENOSCOPY (EGD) WITH PROPOFOL;  Surgeon: Jerene Bears, MD;  [facility]: [facility];  Service: Gastroenterology;  Laterality: N/A;  . IR GENERIC HISTORICAL  10/03/2016   IR US GUIDE VASC ACCESS RIGHT 10/03/2016 Greggory Keen, MD [facility]  . IR GENERIC HISTORICAL  10/03/2016   IR FLUORO GUIDE PORT INSERTION RIGHT 10/03/2016 Greggory Keen, MD [facility]  . KNEE ARTHROSCOPY WITH MEDIAL MENISECTOMY Right 12/16/2014   Procedure: RIGHT KNEE ARTHROSCOPY WITH MEDIAL MENISECTOMY microfracture medial femoral condyle abrasion condroplasty medial femoral condyle lateral menisectomy;  Surgeon: Tobi Bastos, MD;  Location: [facility];  Service: Orthopedics;  Laterality: Right;  . LEFT HEART CATHETERIZATION WITH CORONARY ANGIOGRAM N/A 10/26/2013   Procedure: LEFT HEART CATHETERIZATION WITH CORONARY ANGIOGRAM;  Surgeon: Jettie Booze, MD;  Location: [facility]  CATH LAB;  Service: Cardiovascular;  Laterality: N/A;  . LUMBAR [place] SURGERY  2001  . SHOULDER SURGERY Right    right fx  . TONSILLECTOMY    . TRANSTHORACIC ECHOCARDIOGRAM  03/2016   EF 55-60%, grade I DD, LAE  . WRIST SURGERY Right    right fx    SOCIAL HISTORY: Social History   Socioeconomic History  . Marital status: Married    Spouse name: susan  . Number of children: 2  . Years of education: Not on file  . Highest education level: Not on file  Occupational History  . Occupation: Retired  Scientific laboratory technician  . Financial resource strain: Not on file  . Food insecurity:    Worry: Not on file     Inability: Not on file  . Transportation needs:    Medical: Not on file    Non-medical: Not on file  Tobacco Use  . Smoking status: Former Smoker    Packs/day: 1.50    Years: 30.00    Pack years: 45.00    Types: Cigarettes, Pipe, Cigars    Last attempt to quit: 10/30/1983    Years since quitting: 34.6  . Smokeless tobacco: Current User    Types: Chew    Last attempt to quit: 05/15/2016  Substance and Sexual Activity  . Alcohol use: No  . Drug use: No  . Sexual activity: Not Currently  Lifestyle  . Physical activity:    Days per week: Not on file    Minutes per session: Not on file  . Stress: Not on file  Relationships  . Social connections:    Talks on phone: Not on file    Gets together: Not on file    Attends religious service: Not on file    Active member of club or organization: Not on file    Attends meetings of clubs or organizations: Not on file    Relationship status: Not on file  . Intimate partner violence:    Fear of current or ex partner: Not on file    Emotionally abused: Not on file    Physically abused: Not on file    Forced sexual activity: Not on file  Other Topics Concern  . Not on file  Social History Narrative   Married, one son and one daughter.   His daughter and her two children live with him.   Coffee daily.  Former smoker.  No alcohol.   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   Attends church weekly-   Oxygen continuous    FAMILY HISTORY: Family History  Problem Relation Age of Onset  . Heart attack Mother   . Aneurysm Father   . Alcohol abuse Father   . Diabetes Maternal Aunt   . Colon cancer Neg Hx     ALLERGIES:  has No Known Allergies.  MEDICATIONS:  Current Outpatient Medications  Medication Sig Dispense Refill  . atorvastatin (LIPITOR) 80 MG tablet TAKE 1 TABLET BY MOUTH ONCE DAILY (Patient taking differently: TAKE 1 TABLET BY MOUTH ONCE DAILY IN THE EVENING) 90 tablet 3  . baclofen (LIORESAL) 10  MG tablet 1/2 - 1 tab po q8h prn muscle spasms (Patient taking differently: Take 5-10 mg by mouth every 8 (eight) hours as needed for muscle spasms. ) 30 each 1  . beta carotene w/minerals (OCUVITE) tablet Take 1 tablet by mouth daily.    . cetirizine (ZYRTEC) 10 MG tablet Take 10 mg by mouth every  evening.     . citalopram (CELEXA) 20 MG tablet TAKE 1 TABLET BY MOUTH ONCE DAILY 90 tablet 1  . clonazePAM (KLONOPIN) 1 MG tablet TAKE 1 TABLET BY MOUTH TWICE DAILY AS NEEDED FOR  ANXIETY 60 tablet 5  . clotrimazole-betamethasone (LOTRISONE) cream Apply to affected area twice daily (Patient taking differently: Apply 1 application topically 2 (two) times daily as needed (for infection). ) 45 g 2  . cromolyn (OPTICROM) 4 % ophthalmic solution Place 1 drop into both eyes. 3 to 4 times daily    . ezetimibe (ZETIA) 10 MG tablet TAKE 1 TABLET BY MOUTH ONCE DAILY (Patient taking differently: TAKE 1 TABLET BY MOUTH ONCE DAILY AT BEDTIME) 90 tablet 3  . furosemide (LASIX) 20 MG tablet Take 1 tablet (20 mg total) by mouth daily. 90 tablet 1  . gabapentin (NEURONTIN) 300 MG capsule TAKE ONE CAPSULE BY MOUTH THREE TIMES DAILY 270 capsule 3  . HUMALOG KWIKPEN 200 UNIT/ML SOPN INJECT 24 UNIT SUBCUTANEOUSLY THREE TIMES DAILY BEFORE MEAL(S) 33 mL 1  . HUMULIN N KWIKPEN 100 UNIT/ML Kiwkpen INJECT 16 UNITS SUBCUTANEOUSLY IN THE MORNING AND 16 IN THE EVENING 30 mL 0  . hydrOXYzine (ATARAX/VISTARIL) 25 MG tablet TAKE 1 TABLET BY MOUTH WITH SUPPER AND 1 AT BEDTIME FOR INSOMNIA 60 tablet 6  . Insulin Lispro (HUMALOG KWIKPEN) 200 UNIT/ML SOPN Inject 20 Units into the skin 2 (two) times daily with a meal. (Patient taking differently: Inject 24 Units into the skin 3 (three) times daily. ) 18 pen 6  . Insulin Lispro (HUMALOG KWIKPEN) 200 UNIT/ML SOPN Inject 20 Units into the skin 2 (two) times daily. 18 pen 1  . Insulin NPH, Human,, Isophane, (HUMULIN N) 100 UNIT/ML Kiwkpen 16 units every morning and 16 units every evening (Patient  taking differently: Inject 16 Units into the skin 2 (two) times daily. ) 15 mL 3  . magnesium chloride (SLOW-MAG) 64 MG TBEC SR tablet Take 1 tablet (64 mg total) by mouth daily. 60 tablet 0  . Omega-3 Fatty Acids (FISH OIL) 1200 MG CAPS Take 2,400 mg by mouth 2 (two) times daily.    . ONE TOUCH ULTRA TEST test strip USE 1 STRIP TO CHECK GLUCOSE THREE TIMES DAILY AS DIRECTED 100 each 11  . ONETOUCH DELICA LANCETS 02I MISC USE ONE  TO CHECK GLUCOSE THREE TIMES DAILY 100 each 11  . OXYGEN Inhale 2 L into the lungs continuous.     . pantoprazole (PROTONIX) 40 MG tablet Take 1 tablet (40 mg total) by mouth 2 (two) times daily. 60 tablet 6  . sucralfate (CARAFATE) 1 GM/10ML suspension Take 10 mLs (1 g total) by mouth 4 (four) times daily -  with meals and at bedtime. 420 mL 0   No current facility-administered medications for this visit.    Facility-Administered Medications Ordered in Other Visits  Medication Dose Route Frequency Provider Last Rate Last Dose  . sodium chloride flush (NS) 0.9 % injection 10 mL  10 mL Intravenous PRN Truitt Merle, MD   10 mL at 04/16/17 1459  . sodium chloride flush (NS) 0.9 % injection 10 mL  10 mL Intravenous PRN Truitt Merle, MD   10 mL at 05/02/17 1725    REVIEW OF SYSTEMS:  Constitutional: Denies fevers, abnormal night sweats (+) in wheelchair (+) 2L O2 cannula  Eyes: Denies blurriness of vision, double vision or watery eyes Ears, nose, mouth, throat, and face: Denies mucositis or sore throat, Respiratory: Denies cough, wheezes  Cardiovascular: Denies palpitation, chest discomfort or lower extremity swelling Gastrointestinal:  Denies nausea, heartburn or change in bowel habits  Skin: Denies abnormal skin rashes  Lymphatics: Denies new lymphadenopathy or easy bruising Neurological:Denies numbness, tingling  Behavioral/Psych: Mood is stable, no new changes  All other systems were reviewed with the patient and are negative.  PHYSICAL EXAMINATION:  ECOG  PERFORMANCE STATUS: 3 - Symptomatic, >50% confined to bed BP 115/67 (BP Location: Left Arm, Patient Position: Sitting)   Pulse 94   Temp 98.2 F (36.8 C) (Oral)   Resp 18   Ht _0  (1.727 m)   Wt 285 lb 6.4 oz (129.5 kg)   SpO2 97%   BMI 43.39 kg/m   GENERAL:alert, no distress and comfortable, morbid obese, sitting in wheelchair with nasal cannula oxygen  SKIN: skin color, texture, turgor are normal, no rashes or significant lesions EYES: normal, conjunctiva are pink and non-injected, sclera clear OROPHARYNX:no exudate, no erythema and lips, buccal mucosa, and tongue normal  NECK: supple, thyroid normal size, non-tender, without nodularity LYMPH:  no palpable lymphadenopathy in the cervical, axillary or inguinal LUNGS: (+) minimally decreased in breath sounds in the bottom of right lung, improved from last visit (+) stable b/l pleural effusion R>L HEART: regular rate & rhythm and no murmurs and no lower extremity edema ABDOMEN:abdomen soft, non-tender and normal bowel sounds Musculoskeletal:no cyanosis of digits and no clubbing  PSYCH: alert & oriented x 3 with fluent speech NEURO: no focal motor/sensory deficits EXT: Trace edema, he wears compression stocks, no significant ankle swollen, erythema, or warmness.   LABORATORY DATA:  I have reviewed the data as listed CBC Latest Ref Rng & Units 06/16/2018 04/15/2018 03/28/2018  WBC 4.0 - 10.3 K/uL 4.9 5.3 4.2  Hemoglobin 13.0 - 17.1 g/dL 10.2(L) 9.8(L) 9.5(L)  Hematocrit 38.4 - 49.9 % 34.0(L) 32.8(L) 29.4(L)  Platelets 140 - 400 K/uL 113(L) 109(L) 145   CMP Latest Ref Rng & Units 06/16/2018 04/15/2018 03/28/2018  Glucose 70 - 99 mg/dL 151(H) 142(H) 118(H)  BUN 8 - 23 mg/dL _1 Creatinine 0.61 - 1.24 mg/dL 1.04 1.02 1.10  Sodium 135 - 145 mmol/L 140 138 138  Potassium 3.5 - 5.1 mmol/L 3.7 3.8 4.4  Chloride 98 - 111 mmol/L 97(L) 96(L) 99  CO2 22 - 32 mmol/L 32 33(H) 27  Calcium 8.9 - 10.3 mg/dL 8.4(L) 9.2 8.9  Total Protein 6.5  - 8.1 g/dL 7.4 7.1 6.6  Total Bilirubin 0.3 - 1.2 mg/dL 0.5 0.6 0.6  Alkaline Phos 38 - 126 U/L 71 68 -  AST 15 - 41 U/L 30 33 38(H)  ALT 0 - 44 U/L 24 23 32   PATHOLOGY REPORT  Diagnosis 05/04/2016 1. Esophagogastric junction, biopsy, ulcerative mass - POORLY DIFFERENTIATED CARCINOMA, SEE COMMENT. 2. Stomach, biopsy - REACTIVE GASTROPATHY. - NEGATIVE FOR HELICOBACTER PYLORI. - NO INTESTINAL METAPLASIA, DYSPLASIA, OR MALIGNANCY. Microscopic Comment 1. The majority of the specimen consists of gastroesophageal mucosa with reflux changes. There is a small focus of tumor underlying the squamous mucosa. There is lymphovascular invasion. There is no background intestinal metaplasia (Barrett's esophagus). Immunohistochemistry reveals only very focal weak cytokeratin 5/6, negative p63, and negative mucicarmine in the limited remaining tumor. Dr. Saralyn Pilar has reviewed the case. The case was called to Dr. Hilarie Fredrickson on 05/08/2016. 2. A Warthin-Starry stain is performed to determine the possibility of the presence of Helicobacter pylori. The Warthin-Starry stain is negative for organisms of Helicobacter pylori.      PROCEDURES  04/28/2018  Upper Endoscopy - Grade I and small (< 5 mm) distal esophageal varices. - Previously seen esophageal ulceration has healed. No evidence for GE junction tumor. - Mild portal hypertensive gastropathy. - Normal examined duodenum. - No specimens collected.  Upper Endoscopy by Dr. Hilarie Fredrickson on 03/19/18  IMPRESSION - Distal esophageal ulcer. Biopsied (could represent radiation-induced ulcers, rule out recurrence) - Non-bleeding erosive gastropathy. Biopsied. And benign - Normal examined duodenum.   RADIOGRAPHIC STUDIES: I have personally reviewed the radiological images as listed and agreed with the findings in the report.  06/16/2018 CT CAP IMPRESSION: 1. New mild upper right paratracheal adenopathy, nonspecific, cannot exclude early recurrent metastatic  disease. 2. No additional potential findings of metastatic disease in the chest, abdomen or pelvis. No discrete CT findings of local tumor recurrence at the esophagogastric junction. 3. Moderate dependent right pleural effusion, mildly increased. Small dependent left pleural effusion, stable. 4. Hepatic cirrhosis. Small volume ascites. 5.  Aortic Atherosclerosis (ICD10-I70.0).   CT CAP W Contrast 12/05/17 IMPRESSION: 1. Stable exam. No esophageal mass or mediastinal adenopathy identified. No evidence for metastatic disease to the chest, abdomen and pelvis. 2. Cirrhosis with ascites. 3. Persistent bilateral pleural effusions, right greater than left. 4. Aortic Atherosclerosis (ICD10-I70.0). Three vessel coronary artery atherosclerotic calcifications noted. 5. Thoracic and lumbar spondylosis and advanced right glenohumeral joint arthropathy. 6. Gallstones.   Chest X-Ray 10/21/17 IMPRESSION: Small bilateral effusions, decreased on the right since prior CT.  CT CAP W Contrast 09/05/17  IMPRESSION: 1. Currently no esophageal mass is visible, nor is there significant mediastinal adenopathy. There is hepatic cirrhosis but no compelling findings of hepatic metastatic disease. 2. Large right pleural effusion is increased from the prior exam. Small stable left pleural effusion. 3. Small but increased amount of ascites and mesenteric edema compared to prior. 4. Other imaging findings of potential clinical significance: Aortic Atherosclerosis (ICD10-I70.0). Coronary atherosclerosis. The markedly severe right glenohumeral arthropathy. Thoracic and lumbar spondylosis with lower lumbar multilevel impingement, and notable intervertebral spurring at T12-L1. Cholelithiasis. Degenerative arthropathy of both hips.   CT CAP w contrast 04/29/2017 IMPRESSION: 1. No evidence of metastatic disease. 2. Nodular airspace disease previously seen in the left lower lobe has resolved in the interval. 3.  Tiny right pleural effusion, new. Small left pleural effusion, slightly increased. 4. Cirrhosis with splenomegaly. 5.  Aortic atherosclerosis (ICD10-170.0).   CT CAP w contrast 01/07/2017 IMPRESSION: 1. No residual esophageal mass or adenopathy. 2. New nodular airspace opacification in the left lower lobe. Continued attention on followup exams is warranted as metastatic disease cannot be definitively excluded. 3. Cirrhosis. 4. Trace left pleural effusion. 5. Aortic atherosclerosis (ICD10-170.0). Coronary artery calcification. 6. Cholelithiasis. 7. Mild basilar predominant subpleural reticular densities. Difficult to exclude interstitial lung disease.    EGD 05/04/2016 A large, ulcerating mass with no active bleeding and no stigmata of recent bleeding was found at the gastroesophageal junction extending into the gastric cardia, beginning 40 cm from the incisors. The mass was non-obstructing and partially circumferential (involving one-half of the lumen circumference). The mass extends approximately 5 cm. IMPRESSION:  -Likely malignant esophageal tumor was found at the gastroesophageal junction extending into the gastric cardia. Biopsied. - Non-bleeding erosive gastropathy. Biopsied. - Erythematous duodenopathy. - Normal second portion of the duodenum.  ASSESSMENT & PLAN:   72 y.o. Caucasian male, with multiple comorbidities, including hypertension, diabetes, OSA, coronary artery disease, diastolic CHF, on continuous oxygen, morbid obesity, presented with progressive dysphagia and odynophagia.  1. Cancer of cardio-esophageal junction, poorly differentiated carcinoma, cTxN2M1 with  node metastasis, stage IV  -I previously reviewed his CT scan, EGD, and the biopsy results with patient and his family members in great details. -His biopsy pathology was previously reviewed in our tumor board, the morphology and weakly positive for CK 5/6, fevers squamous cell carcinoma -His CT scan  findings are very concerning for distant metastasis to abdominal lymph nodes.  -I previously reviewed the PET scan images with patient and his daughter in person, he has hypermetabolic GE junction tumor, and diffuse adenopathy in mediastinum and upper abdomen. -We previously discussed the aggressive nature of esophageal cancer, an incurable nature of his disease due to the distant metastasis. The goal of therapy is palliative, to prolong his life and palliate his symptoms. -His tumor was negative for PD-L1, not a candidate for immunotherapy  -He received first line palliative chemotherapy with [place] and Taxol, every 2 weeks (07/06/16-8/23-18), tolerated well overall and had excellent response (completed radiographic response). -We previously reviewed the signs of cancer recurrence. If he notices any pain or new issues he knows to call us  -he has developed pleural effusion since he stopped chemotherapy, he is clinically doing better with restarting 46m, less orthopnea. I recommend him to follow-up with his primary care physician also. -I previously discussed his CT CAP from 12/05/17 which shows no evidence of recurrent or metastatic disease. Also shows cirrhosis with ascites, and persistent b/l pleural effusion.  -He was admitted to hospital on 03/18/18 due to upper GI bleeding. He underwent Upper Endoscopy by Dr. PHilarie Fredricksonon 03/19/18 which showed bleeding ulcer next to previous tumor site.  Multiple biopsy was negative for malignant cells.  He received a blood transfusion on 03/19/18. Repeated EGD on 04/28/2018 showed healed ulcers and no evidence of JEG tumor  -His surveillance CT scan from June 16, 2018 showed no evidence of cancer recurrence, except a borderline 1 cm paratracheal lymph node which is indeterminate. -Labs reviewed, Hg at 10.2 today. -Clinically doing well, no concern for recurrence.  We will continue surveillance. -F/u in 3 months. -Repeat CT scan in 6 months if clinically stable   2.  Anemia, history of upper GI bleeding from gastric ulcer -Related to recent GI Bleeding from gastric ulcer found on 03/19/18 Upper endoscopy -He was given blood transfusion on 03/19/18  -Hg at 10.2 today. I recommend he increase iron in diet and take OTC multivitamin with iron. He is agreeable.   3. Bilateral plural effusion, R>L  -Possibly related to his CHF, he was on furosemide 10 mg daily before chemo, which has been held since he started chemo.   -He had a trace pleural effusion on previous scan, has moderate right pleural effusion, asymptomatic -I previously recommended a thoracentesis, further workup of etiology of his pleural effusion, and ruled out malignant pleural effusion. Patient is reluctant to have procedure, but agrees to have one if he does not respond to diuretics -Restarted furosemide 20 mg daily on 09/12/17   -CT from 12/05/17 showed persistent b/l pleural effusion -Pt experiences cramps from lasix but manageable.  I previously suggested tonic water or Gatorade and for him to take OTC calcium pill. He is fine to eat 1 banana a day.  - He will continue furosemide 292mdaily   4. CAD, diastolic CHF, on continuous oxygen -He will continue follow-up with his cardiologist Dr. TuRadford PaxHis Plavix has been held since the EGD and biopsy, he is on baby aspirin. -he is clinically stable -On 2 Liters of continuous oxygen [place].  -Will continue to  monitor   5. DM, HTN  -We previously discussed that his blood pressure and glucose need to be monitored closely during the chemotherapy  -He will continue medication, monitor his sugar and blood pressure at home, and follow-up with his primary care physician. -previously decreased premed dexa before taxol  -DM and HTN Better controlled recently   6. OSA, morbid obesity -We previously discussed healthy diet, I previously encouraged him to be more physically active, and consider home PT -previously encouraged him to follow-up with dietitian  during the therapy for nutrition support.  7. Arthralgia -He has baseline osteoarthritis, not physically active -He will use oxycodone 5 mg as needed, constipation management previously reviewed with him. -He is no longer taking aspirin, and his arthritis has not been present lately   8. Liver cirrhosis and ascites  -He had a mild elevated AST in the past. Pt states he has not consumed alcohol in 30 years.  -His prior CT scans showed evidence of liver cirrhosis and also showed mild to moderate ascites -He had no known history of liver disease, this could be related to fatty liver or congestive heart failure.   -Continue diuretics, and follow-up   9. Goal of care discussion, DNR/DNI -We previously discussed the incurable nature of his cancer, and the overall poor prognosis, especially if he does not have good response to chemotherapy or progress on chemo -The patient understands the goal of care is palliative. -Patient is very realistic, understands his cancer is not curable, and he previously agreed with DO NOT RESUSCITATE and DO NOT INTUBATE.  -since he is NED now, it will be reasonable to rever his code status to full code    PLAN -lab and scan reviewed, NED -F/u in 3 months    All questions were answered. The patient knows to call the clinic with any problems, questions or concerns.  I spent 20 minutes counseling the patient face to face. The total time spent in the appointment was 25 minutes and more than 50% was on counseling.    Truitt Merle  06/18/2018

## 2018-06-16 ENCOUNTER — Inpatient Hospital Stay: Payer: Medicare Other | Attending: Hematology

## 2018-06-16 ENCOUNTER — Inpatient Hospital Stay: Payer: Medicare Other

## 2018-06-16 ENCOUNTER — Ambulatory Visit (HOSPITAL_COMMUNITY)
Admission: RE | Admit: 2018-06-16 | Discharge: 2018-06-16 | Disposition: A | Discharge status: Home / Self Care | Payer: Medicare Other | Source: Ambulatory Visit | Attending: Hematology | Admitting: Hematology

## 2018-06-16 DIAGNOSIS — K254 Chronic or unspecified gastric ulcer with hemorrhage: Secondary | ICD-10-CM | POA: Insufficient documentation

## 2018-06-16 DIAGNOSIS — Z9221 Personal history of antineoplastic chemotherapy: Secondary | ICD-10-CM | POA: Insufficient documentation

## 2018-06-16 DIAGNOSIS — I700 Atherosclerosis of aorta: Secondary | ICD-10-CM | POA: Insufficient documentation

## 2018-06-16 DIAGNOSIS — Z95828 Presence of other vascular implants and grafts: Secondary | ICD-10-CM

## 2018-06-16 DIAGNOSIS — Z9981 Dependence on supplemental oxygen: Secondary | ICD-10-CM | POA: Diagnosis not present

## 2018-06-16 DIAGNOSIS — G4733 Obstructive sleep apnea (adult) (pediatric): Secondary | ICD-10-CM | POA: Diagnosis not present

## 2018-06-16 DIAGNOSIS — R188 Other ascites: Secondary | ICD-10-CM | POA: Insufficient documentation

## 2018-06-16 DIAGNOSIS — K7460 Unspecified cirrhosis of liver: Secondary | ICD-10-CM | POA: Insufficient documentation

## 2018-06-16 DIAGNOSIS — I2510 Atherosclerotic heart disease of native coronary artery without angina pectoris: Secondary | ICD-10-CM | POA: Insufficient documentation

## 2018-06-16 DIAGNOSIS — E119 Type 2 diabetes mellitus without complications: Secondary | ICD-10-CM | POA: Insufficient documentation

## 2018-06-16 DIAGNOSIS — C160 Malignant neoplasm of cardia: Secondary | ICD-10-CM

## 2018-06-16 DIAGNOSIS — J90 Pleural effusion, not elsewhere classified: Secondary | ICD-10-CM | POA: Insufficient documentation

## 2018-06-16 DIAGNOSIS — I10 Essential (primary) hypertension: Secondary | ICD-10-CM | POA: Diagnosis not present

## 2018-06-16 DIAGNOSIS — R590 Localized enlarged lymph nodes: Secondary | ICD-10-CM | POA: Diagnosis not present

## 2018-06-16 DIAGNOSIS — Z8501 Personal history of malignant neoplasm of esophagus: Secondary | ICD-10-CM | POA: Diagnosis not present

## 2018-06-16 DIAGNOSIS — D508 Other iron deficiency anemias: Secondary | ICD-10-CM | POA: Insufficient documentation

## 2018-06-16 DIAGNOSIS — I5030 Unspecified diastolic (congestive) heart failure: Secondary | ICD-10-CM | POA: Diagnosis not present

## 2018-06-16 DIAGNOSIS — M1990 Unspecified osteoarthritis, unspecified site: Secondary | ICD-10-CM | POA: Diagnosis not present

## 2018-06-16 DIAGNOSIS — Z87891 Personal history of nicotine dependence: Secondary | ICD-10-CM | POA: Insufficient documentation

## 2018-06-16 LAB — CBC WITH DIFFERENTIAL/PLATELET
BASOS ABS: 0 10*3/uL (ref 0.0–0.1)
Basophils Relative: 1 %
EOS ABS: 0.2 10*3/uL (ref 0.0–0.5)
EOS PCT: 3 %
HCT: 34 % — ABNORMAL LOW (ref 38.4–49.9)
Hemoglobin: 10.2 g/dL — ABNORMAL LOW (ref 13.0–17.1)
LYMPHS ABS: 0.8 10*3/uL — AB (ref 0.9–3.3)
Lymphocytes Relative: 17 %
MCH: 27.7 pg (ref 27.2–33.4)
MCHC: 30 g/dL — ABNORMAL LOW (ref 32.0–36.0)
MCV: 92.4 fL (ref 79.3–98.0)
MONO ABS: 0.8 10*3/uL (ref 0.1–0.9)
Monocytes Relative: 16 %
Neutro Abs: 3.1 10*3/uL (ref 1.5–6.5)
Neutrophils Relative %: 63 %
PLATELETS: 113 10*3/uL — AB (ref 140–400)
RBC: 3.68 MIL/uL — AB (ref 4.20–5.82)
RDW: 19.1 % — AB (ref 11.0–14.6)
WBC: 4.9 10*3/uL (ref 4.0–10.3)

## 2018-06-16 LAB — CMP (CANCER CENTER ONLY)
ALT: 24 U/L (ref 0–44)
AST: 30 U/L (ref 15–41)
Albumin: 3.2 g/dL — ABNORMAL LOW (ref 3.5–5.0)
Alkaline Phosphatase: 71 U/L (ref 38–126)
Anion gap: 11 (ref 5–15)
BILIRUBIN TOTAL: 0.5 mg/dL (ref 0.3–1.2)
BUN: 12 mg/dL (ref 8–23)
CO2: 32 mmol/L (ref 22–32)
CREATININE: 1.04 mg/dL (ref 0.61–1.24)
Calcium: 8.4 mg/dL — ABNORMAL LOW (ref 8.9–10.3)
Chloride: 97 mmol/L — ABNORMAL LOW (ref 98–111)
GFR, Est AFR Am: >60 mL/min (ref >60)
GFR, Estimated: >60 mL/min (ref >60)
Glucose, Bld: 151 mg/dL — ABNORMAL HIGH (ref 70–99)
Potassium: 3.7 mmol/L (ref 3.5–5.1)
Sodium: 140 mmol/L (ref 135–145)
TOTAL PROTEIN: 7.4 g/dL (ref 6.5–8.1)

## 2018-06-16 MED ORDER — HEPARIN SOD (PORK) LOCK FLUSH 100 UNIT/ML IV SOLN
500.0000 [IU] | Freq: Once | INTRAVENOUS | Status: AC | PRN
  Administered 2018-06-16: 500 [IU] via INTRAVENOUS
  Filled 2018-06-16: qty 5

## 2018-06-16 MED ORDER — HEPARIN SOD (PORK) LOCK FLUSH 100 UNIT/ML IV SOLN
INTRAVENOUS | Status: AC
  Filled 2018-06-16: qty 5

## 2018-06-16 MED ORDER — HEPARIN SOD (PORK) LOCK FLUSH 100 UNIT/ML IV SOLN
500.0000 [IU] | Freq: Once | INTRAVENOUS | Status: AC
  Administered 2018-06-16: 500 [IU] via INTRAVENOUS

## 2018-06-16 MED ORDER — IOHEXOL 300 MG/ML  SOLN
100.0000 mL | Freq: Once | INTRAMUSCULAR | Status: AC | PRN
  Administered 2018-06-16: 100 mL via INTRAVENOUS

## 2018-06-16 MED ORDER — SODIUM CHLORIDE 0.9% FLUSH
10.0000 mL | INTRAVENOUS | Status: DC | PRN
  Administered 2018-06-16: 10 mL via INTRAVENOUS
  Filled 2018-06-16: qty 10

## 2018-06-18 ENCOUNTER — Encounter: Payer: Self-pay | Admitting: Hematology

## 2018-06-18 ENCOUNTER — Inpatient Hospital Stay: Payer: Medicare Other | Admitting: Hematology

## 2018-06-18 ENCOUNTER — Telehealth: Payer: Self-pay | Admitting: Hematology

## 2018-06-18 VITALS — BP 115/67 | HR 94 | Temp 98.2°F | Resp 18 | Ht 68.0 in | Wt 285.4 lb

## 2018-06-18 DIAGNOSIS — R188 Other ascites: Secondary | ICD-10-CM

## 2018-06-18 DIAGNOSIS — G4733 Obstructive sleep apnea (adult) (pediatric): Secondary | ICD-10-CM

## 2018-06-18 DIAGNOSIS — E119 Type 2 diabetes mellitus without complications: Secondary | ICD-10-CM | POA: Diagnosis not present

## 2018-06-18 DIAGNOSIS — K7460 Unspecified cirrhosis of liver: Secondary | ICD-10-CM

## 2018-06-18 DIAGNOSIS — K254 Chronic or unspecified gastric ulcer with hemorrhage: Secondary | ICD-10-CM

## 2018-06-18 DIAGNOSIS — I10 Essential (primary) hypertension: Secondary | ICD-10-CM | POA: Diagnosis not present

## 2018-06-18 DIAGNOSIS — J90 Pleural effusion, not elsewhere classified: Secondary | ICD-10-CM

## 2018-06-18 DIAGNOSIS — M1990 Unspecified osteoarthritis, unspecified site: Secondary | ICD-10-CM | POA: Diagnosis not present

## 2018-06-18 DIAGNOSIS — Z8501 Personal history of malignant neoplasm of esophagus: Secondary | ICD-10-CM

## 2018-06-18 DIAGNOSIS — Z9221 Personal history of antineoplastic chemotherapy: Secondary | ICD-10-CM | POA: Diagnosis not present

## 2018-06-18 DIAGNOSIS — Z9981 Dependence on supplemental oxygen: Secondary | ICD-10-CM

## 2018-06-18 DIAGNOSIS — I5030 Unspecified diastolic (congestive) heart failure: Secondary | ICD-10-CM

## 2018-06-18 DIAGNOSIS — I5032 Chronic diastolic (congestive) heart failure: Secondary | ICD-10-CM

## 2018-06-18 DIAGNOSIS — I2510 Atherosclerotic heart disease of native coronary artery without angina pectoris: Secondary | ICD-10-CM | POA: Diagnosis not present

## 2018-06-18 DIAGNOSIS — D508 Other iron deficiency anemias: Secondary | ICD-10-CM

## 2018-06-18 DIAGNOSIS — C160 Malignant neoplasm of cardia: Secondary | ICD-10-CM

## 2018-06-18 DIAGNOSIS — Z87891 Personal history of nicotine dependence: Secondary | ICD-10-CM | POA: Diagnosis not present

## 2018-06-18 NOTE — Telephone Encounter (Signed)
Scheduled appt per 8/21 los - gave patient AVS and calender per los.  

## 2018-06-19 ENCOUNTER — Encounter: Payer: Self-pay | Admitting: Family Medicine

## 2018-06-24 ENCOUNTER — Other Ambulatory Visit: Payer: Self-pay | Admitting: Family Medicine

## 2018-07-08 DIAGNOSIS — J449 Chronic obstructive pulmonary disease, unspecified: Secondary | ICD-10-CM | POA: Diagnosis not present

## 2018-07-09 ENCOUNTER — Other Ambulatory Visit: Payer: Self-pay | Admitting: Family Medicine

## 2018-07-25 ENCOUNTER — Ambulatory Visit: Payer: Medicare Other | Admitting: Family Medicine

## 2018-07-25 ENCOUNTER — Encounter: Payer: Self-pay | Admitting: Family Medicine

## 2018-07-25 VITALS — BP 118/67 | HR 74 | Temp 97.8°F | Resp 16 | Ht 68.0 in | Wt 279.0 lb

## 2018-07-25 DIAGNOSIS — N183 Chronic kidney disease, stage 3 unspecified: Secondary | ICD-10-CM

## 2018-07-25 DIAGNOSIS — F3342 Major depressive disorder, recurrent, in full remission: Secondary | ICD-10-CM

## 2018-07-25 DIAGNOSIS — E119 Type 2 diabetes mellitus without complications: Secondary | ICD-10-CM | POA: Diagnosis not present

## 2018-07-25 DIAGNOSIS — Z23 Encounter for immunization: Secondary | ICD-10-CM

## 2018-07-25 DIAGNOSIS — J9611 Chronic respiratory failure with hypoxia: Secondary | ICD-10-CM | POA: Diagnosis not present

## 2018-07-25 DIAGNOSIS — C159 Malignant neoplasm of esophagus, unspecified: Secondary | ICD-10-CM

## 2018-07-25 DIAGNOSIS — F419 Anxiety disorder, unspecified: Secondary | ICD-10-CM

## 2018-07-25 DIAGNOSIS — N2889 Other specified disorders of kidney and ureter: Secondary | ICD-10-CM

## 2018-07-25 LAB — POCT GLYCOSYLATED HEMOGLOBIN (HGB A1C): HbA1c, POC (controlled diabetic range): 6.4 % (ref 0.0–7.0)

## 2018-07-25 MED ORDER — CLONAZEPAM 1 MG PO TABS: 1.0000 mg | ORAL_TABLET | Freq: Two times a day (BID) | ORAL | 5 refills | Status: DC | PRN

## 2018-07-25 NOTE — Progress Notes (Signed)
OFFICE VISIT  07/25/2018   CC:  Chief Complaint  Patient presents with  . Follow-up    RCI   HPI:    Patient is a 72 y.o. Caucasian male who presents accompanied by his daughter for 3 mo f/u DM 2, CRI III, and chronic hypoxic resp failure secondary to obesity hypoventilation syndrome and COPD. He also has chronic diastolic HF and CAD and is managed by Dr. Radford Pax in cardiology.  He is doing well.  Goes to church on sundays.  Goes to MD visits.  Otherwise is homebound, mainly due to inability to drive himself anywhere.  His primary medical problem the last 2 years has been metastatic esophageal cancer.  Most recent onc f/u imaging showed no sign of dz recurrence or mets 06/16/2018.  Relatively recent esoph ulcer that caused signif GI bleed--->repeat EGD after treatment showed ulcer was healed.  Latest Hb 10.2 (stable) 6 weeks ago.  Denies melena or hematochezia.  Still taking PPI bid.  DM:  Humulin N 16 U qAM and 16 u qPM.  Humalog 20 U bid with meals.  His glucoses had started to rise after stopping metformin last visit so he restarted it 1000 mg qd. Glucoses: fasting 250s for the first 3 wks of July while off of metformin.  After getting back on metformin his glucoses are more consistently 150s-170s.  Chronic hypox resp failure: wearing 2L oxygen 24/7.  Breathing feels stable. No SOB with ambulating around his home.  He walks around his entire church with his walker and doesn't feel SOB. He has unstable gait and uses a walker all the time.   Anxiety/dep: mood and anxiety stable.  Takes clonaz 1m bid scheduled.  No side effects. Also taking citalopram 20 mg qd.  CRI: he avoids NSAIDs.  He tries to hydrate appropriately.  Past Medical History:  Diagnosis Date  . Acute upper GI bleed 02/2018   Transfused 4 U pRBCs-->EGD showed an esoph ulcer and some gastric ulcers (?radiation-induced), path showed no malignancy or Barrett's.  Rpt EGD 04/28/18: Grade I and small (< 5 mm) distal  esophageal varices.  Esoph ulcer healed.  Mild portal hypert gastrop.  No sign of malignancy.  . Asthma    as a child  . CAD in native artery    a. cardiac cath 09/2013 showed severely diseased mLAD and diagonal, mild LCx disease, moderate RCA disease, LVEDP 20, EF 50%.  . Cataract    multiple types, bilateral  . Cholelithiasis without cholecystitis   . Chronic diastolic CHF (congestive heart failure) ([facility] 02/25/2009  . Chronic renal insufficiency, stage III (moderate) (HCC) 2017   Stage II/III (GFR around 60)  . Cirrhosis, nonalcoholic ([place] [address]  with splenomegaly.  CT 11/2017 w/ascites and persistent bilat pleural effusions  . 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 COPD (chronic obstructive pulmonary disease) ([facility]   . Coronary artery disease    chronically occluded LAD and diagonal with right to left collaterals, mild disease in the left circ and moderate disease in the mid RCA on medical management with Imdur, ASA, and Plavix.  . Dermatitis 05/2014  . DIABETES MELLITUS, TYPE II 06/24/2007   No diab retpthy as of 08/05/15 eye exam.  . Esophageal cancer ([facility] 04/2016   poorly differentiated carcinoma (Dr. Pyrtle--EGD).  CT C/A/P showed metastatic adenopathy in mediastinum and upper abdomen 05/09/16.  Tx plan is palliative radiation (completed 06/22/16), then palliative systemic  chemotherapy (carbo+taxol) was started but as of 08/03/16 onc f/u this was held due to severe knee and ankle arthralgias.  Restarting as of 10/2015 (08/2016 CT showed some disease regression  . Esophageal cancer ([place])    CT 12/2016 showed no residual esoph mass--plan to continue chemo.  Ongoing palliative chemo with carboplatin and taxol q 2 wks as of 03/2017 onc f/u (CT 12/2016 and 04/2017 showed no progression of dz).  Taking a 2 mo break from chemo as of 05/2017.  Pleural effusion-- improved with lasix but onc wanted him to get dx/therap thoracentesis-pt  declined.  CT C/A/P no progression/recurr/mets 11/2017.  Marland Kitchen Esophageal cancer ([place])    06/16/18 surveillance CTs showed no evidence of dz recurrence.  . Essential hypertension 05/01/2007   Qualifier: Diagnosis of  By: Tiney Rouge CMA, Ellison Hughs     . History of kidney stones   . Hyperkalemia 12/2015   Decreased ACE-I by 50% in response, then potassium normalized.  Marland Kitchen HYPERLIPIDEMIA 06/24/2007   03/2018 lipids at goal  . Hypoxemia 01/27/2010  . Morbid obesity ([place])   . Myocardial infarction [facility])    pt states he was informed per MD that he has had one but pt was unaware   . Obesity hypoventilation syndrome (HCC)    oxygen 24/7 (2 liters  as of 02/2016)  . On home oxygen therapy    Oxygen @ 2l/m nasally 24/7 hours  . OSA (obstructive sleep apnea)    not tested; pt scored 4 per stop bang tool results sent to PCP   . OSTEOARTHRITIS 05/01/2007  . Pancytopenia due to chemotherapy ([place]) 2018  . Pleural effusion on right 08/2017   ? malignant vs CHF: pt refuses diagnostic thoracentesis b/c he states he feels fine, wants to try diuretic first.  . Pleural effusion on right 09/2017   Thoracentesis  . Presbycusis of both ears 05/2015   [facility]  . Pruritic condition 05/2014   Allergist summer 2015, no new testing.  Marland Kitchen RBBB   . Visual field defect 05/10/2016   Per Dr. Melina Fiddler, O.D.: Rt, loss of inf/temp quad and some loss sup/temp.  ?CVA  ? Pituitary tumor? 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    Past Surgical History:  Procedure Laterality Date  . APPENDECTOMY    . BALLOON DILATION N/A 05/04/2016   Procedure: BALLOON DILATION;  Surgeon: Jerene Bears, MD;  Location: [facility];  Service: Gastroenterology;  Laterality: N/A;  . BIOPSY  03/19/2018   Procedure: BIOPSY;  Surgeon: Jackquline Denmark, MD;  Location: [facility];  Service: Endoscopy;;  . CARDIAC CATHETERIZATION    . CARPAL TUNNEL RELEASE Left   . CATARACT EXTRACTION W/PHACO Right 04/29/2013   Procedure:  CATARACT EXTRACTION PHACO AND INTRAOCULAR LENS PLACEMENT (IOC);  Surgeon: Adonis Brook, MD;  Location: [facility];  Service: Ophthalmology;  Laterality: Right;  . CATARACT EXTRACTION, BILATERAL    . COLONOSCOPY W/ POLYPECTOMY  08/2011   Many polyps--all hyperplastic, severe diverticulosis, int hem.  [employer] hemoccult testing via [facility] 06/22/15 NEG  . ESOPHAGOGASTRODUODENOSCOPY N/A 05/04/2016   Procedure: ESOPHAGOGASTRODUODENOSCOPY (EGD);  Surgeon: Jerene Bears, MD;  Location: [facility];  Service: Gastroenterology;  Laterality: N/A;  . ESOPHAGOGASTRODUODENOSCOPY (EGD) WITH PROPOFOL N/A 03/19/2018   Gastric ulcers, esoph ulcer (radiation-induced?), bx-->path neg for malignancy or Barrett's.  GI plans repeat EGD 6-8 wks after 1st.   Procedure: ESOPHAGOGASTRODUODENOSCOPY (EGD) WITH PROPOFOL;  Surgeon: Jackquline Denmark, MD;  [facility]: [facility];  Service: Endoscopy;  Laterality: N/A;  . ESOPHAGOGASTRODUODENOSCOPY (EGD) WITH PROPOFOL N/A 04/28/2018   Esoph ulcer healed.  Grade I and small (< 5 mm) distal esophageal varices.  Mild portal hypertensive gastropathy.  No evidence of malignance.  Procedure: ESOPHAGOGASTRODUODENOSCOPY (EGD) WITH PROPOFOL;  Surgeon: Jerene Bears, MD;  [facility];  Service: Gastroenterology;  Laterality: N/A;  . IR GENERIC HISTORICAL  10/03/2016   IR US GUIDE VASC ACCESS RIGHT 10/03/2016 Greggory Keen, MD [facility]  . IR GENERIC HISTORICAL  10/03/2016   IR FLUORO GUIDE PORT INSERTION RIGHT 10/03/2016 Greggory Keen, MD [facility]  . KNEE ARTHROSCOPY WITH MEDIAL MENISECTOMY Right 12/16/2014   Procedure: RIGHT KNEE ARTHROSCOPY WITH MEDIAL MENISECTOMY microfracture medial femoral condyle abrasion condroplasty medial femoral condyle lateral menisectomy;  Surgeon: Tobi Bastos, MD;  Location: [facility];  Service: Orthopedics;  Laterality: Right;  . LEFT HEART CATHETERIZATION WITH CORONARY ANGIOGRAM N/A 10/26/2013   Procedure: LEFT HEART CATHETERIZATION WITH CORONARY ANGIOGRAM;   Surgeon: Jettie Booze, MD;  [facility];  Service: Cardiovascular;  Laterality: N/A;  . LUMBAR [place] SURGERY  2001  . SHOULDER SURGERY Right    right fx  . TONSILLECTOMY    . TRANSTHORACIC ECHOCARDIOGRAM  03/2016   EF 55-60%, grade I DD, LAE  . WRIST SURGERY Right    right fx    Outpatient Medications Prior to Visit  Medication Sig Dispense Refill  . atorvastatin (LIPITOR) 80 MG tablet TAKE 1 TABLET BY MOUTH ONCE DAILY (Patient taking differently: TAKE 1 TABLET BY MOUTH ONCE DAILY IN THE EVENING) 90 tablet 3  . baclofen (LIORESAL) 10 MG tablet 1/2 - 1 tab po q8h prn muscle spasms (Patient taking differently: Take 5-10 mg by mouth every 8 (eight) hours as needed for muscle spasms. ) 30 each 1  . beta carotene w/minerals (OCUVITE) tablet Take 1 tablet by mouth daily.    . cetirizine (ZYRTEC) 10 MG tablet Take 10 mg by mouth every evening.     . citalopram (CELEXA) 20 MG tablet TAKE 1 TABLET BY MOUTH ONCE DAILY 90 tablet 1  . clotrimazole-betamethasone (LOTRISONE) cream Apply to affected area twice daily (Patient taking differently: Apply 1 application topically 2 (two) times daily as needed (for infection). ) 45 g 2  . cromolyn (OPTICROM) 4 % ophthalmic solution Place 1 drop into both eyes. 3 to 4 times daily    . ezetimibe (ZETIA) 10 MG tablet TAKE 1 TABLET BY MOUTH ONCE DAILY (Patient taking differently: TAKE 1 TABLET BY MOUTH ONCE DAILY AT BEDTIME) 90 tablet 3  . furosemide (LASIX) 20 MG tablet TAKE 1 TABLET BY MOUTH ONCE DAILY 90 tablet 1  . gabapentin (NEURONTIN) 300 MG capsule TAKE ONE CAPSULE BY MOUTH THREE TIMES DAILY 270 capsule 3  . HUMULIN N KWIKPEN 100 UNIT/ML Kiwkpen INJECT 16 UNITS SUBCUTANEOUSLY IN THE MORNING AND 16 IN THE EVENING 30 mL 0  . hydrOXYzine (ATARAX/VISTARIL) 25 MG tablet TAKE 1 TABLET BY MOUTH WITH SUPPER AND 1 AT BEDTIME FOR INSOMNIA 60 tablet 6  . Insulin Lispro (HUMALOG KWIKPEN) 200 UNIT/ML SOPN Inject 20 Units into the skin 2 (two) times daily.  18 pen 1  . magnesium chloride (SLOW-MAG) 64 MG TBEC SR tablet Take 1 tablet (64 mg total) by mouth daily. 60 tablet 0  . Omega-3 Fatty Acids (FISH OIL) 1200 MG CAPS Take 2,400 mg by mouth 2 (two) times daily.    . ONE TOUCH ULTRA TEST test strip USE 1 STRIP TO CHECK GLUCOSE THREE TIMES  DAILY AS DIRECTED 100 each 11  . ONETOUCH DELICA LANCETS 16X MISC USE   TO CHECK GLUCOSE THREE TIMES DAILY 100 each 11  . OXYGEN Inhale 2 L into the lungs continuous.     . pantoprazole (PROTONIX) 40 MG tablet Take 1 tablet (40 mg total) by mouth 2 (two) times daily. 60 tablet 6  . sucralfate (CARAFATE) 1 GM/10ML suspension Take 10 mLs (1 g total) by mouth 4 (four) times daily -  with meals and at bedtime. 420 mL 0  . clonazePAM (KLONOPIN) 1 MG tablet TAKE 1 TABLET BY MOUTH TWICE DAILY AS NEEDED FOR  ANXIETY 60 tablet 5  . HUMALOG KWIKPEN 200 UNIT/ML SOPN INJECT 24 UNIT SUBCUTANEOUSLY THREE TIMES DAILY BEFORE MEAL(S) (Patient not taking: Reported on 07/25/2018) 33 mL 1  . Insulin Lispro (HUMALOG KWIKPEN) 200 UNIT/ML SOPN Inject 20 Units into the skin 2 (two) times daily with a meal. (Patient not taking: Reported on 07/25/2018) 18 pen 6  . Insulin NPH, Human,, Isophane, (HUMULIN N) 100 UNIT/ML Kiwkpen 16 units every morning and 16 units every evening (Patient not taking: Reported on 07/25/2018) 15 mL 3   Facility-Administered Medications Prior to Visit  Medication Dose Route Frequency Provider Last Rate Last Dose  . sodium chloride flush (NS) 0.9 % injection 10 mL  10 mL Intravenous PRN Truitt Merle, MD   10 mL at 04/16/17 1459  . sodium chloride flush (NS) 0.9 % injection 10 mL  10 mL Intravenous PRN Truitt Merle, MD   10 mL at 05/02/17 1725    No Known Allergies  ROS As per HPI  PE: Blood pressure 118/67, pulse 74, temperature 97.8 F (36.6 C), temperature source Oral, resp. rate 16, height _0  (1.727 m), weight 279 lb (126.6 kg), SpO2 96 %. Body mass index is 42.42 kg/m.  Gen: Alert, chronically ill appearing  but in NAD.  Patient is oriented to person, place, time, and situation. AFFECT: pleasant, lucid thought and speech. Skin: no pallor or jaundice. CV: RRR, no m/r/g.  S1 and S2 distant. LUNGS: CTA bilat, nonlabored resps, good aeration in all lung fields. WRU:EAVW/UJ/WJ EXT: no clubbing or cyanosis.  no edema.      LABS:  Lab Results  Component Value Date   TSH 2.940 08/28/2016   Lab Results  Component Value Date   WBC 4.9 06/16/2018   HGB 10.2 (L) 06/16/2018   HCT 34.0 (L) 06/16/2018   MCV 92.4 06/16/2018   PLT 113 (L) 06/16/2018   Lab Results  Component Value Date   CREATININE 1.04 06/16/2018   BUN 12 06/16/2018   NA 140 06/16/2018   K 3.7 06/16/2018   CL 97 (L) 06/16/2018   CO2 32 06/16/2018   Lab Results  Component Value Date   ALT 24 06/16/2018   AST 30 06/16/2018   ALKPHOS 71 06/16/2018   BILITOT 0.5 06/16/2018   Lab Results  Component Value Date   CHOL 97 (L) 04/24/2018   Lab Results  Component Value Date   HDL 36 (L) 04/24/2018   Lab Results  Component Value Date   LDLCALC 32 04/24/2018   Lab Results  Component Value Date   TRIG 144 04/24/2018   Lab Results  Component Value Date   CHOLHDL 2.7 04/24/2018   Lab Results  Component Value Date   HGBA1C 6.4 07/25/2018   POC HbA1c today= 6.4% IMPRESSION AND PLAN:  1) DM 2: Glucoses went up quite a bit when I d/c'd his metformin 3 mo  ago but now that he is back on metformin 1000 mg qd his glucoses are significantly improving.   POC A1c today: a little up but still fine (6.4%).  Continue current insulins at current dosing AND continue metformin 1000 mg once a day. Flu vaccine today.  2) CRI III: avoids NSAIDS, hydrates appropriately. Lytes/cr have been stable, most recently 6 wks ago.  Plan on repeat of BMET at next f/u in 3 mo if oncology has not already done it first.  3) Chronic hypoxic resp failure (COPD, OHS, chronic diastolic HF)---stable. Oxygenating well on 2L, no dyspnea with walking  on flat surfaces with his walker.  4) Chronic anxiety, hx of recurrent MDD, in remission. Stable.  NO change in meds at this time. Sent eRx for clonazepam today, 76m tabs, 1 bid, #60, RF x 5. CSC UTD.    5) Stage IV esophageal cancer: doing well with palliative care-->is now in surveillance stage. Most recent imaging showed no signs of recurrence (06/16/18). His esoph ulcer has healed but he'll remain on ASA only at this time-- no DAPT unless cardiologist states otherwise.  An After Visit Summary was printed and given to the patient.  FOLLOW UP: Return in about 3 months (around 10/24/2018) for routine chronic illness f/u.  Signed:  PCrissie Sickles MD           07/25/2018

## 2018-07-28 ENCOUNTER — Other Ambulatory Visit: Payer: Self-pay | Admitting: Family Medicine

## 2018-08-07 DIAGNOSIS — J449 Chronic obstructive pulmonary disease, unspecified: Secondary | ICD-10-CM | POA: Diagnosis not present

## 2018-08-18 ENCOUNTER — Other Ambulatory Visit: Payer: Self-pay | Admitting: Family Medicine

## 2018-08-23 ENCOUNTER — Other Ambulatory Visit: Payer: Self-pay | Admitting: Family Medicine

## 2018-09-07 DIAGNOSIS — J449 Chronic obstructive pulmonary disease, unspecified: Secondary | ICD-10-CM | POA: Diagnosis not present

## 2018-09-10 ENCOUNTER — Telehealth: Payer: Self-pay | Admitting: Family Medicine

## 2018-09-10 NOTE — Telephone Encounter (Signed)
Copied from [place] [id number]. Topic: Quick Communication - See Telephone Encounter >> Sep 10, 2018  3:08 PM Bea Graff, NT wrote: CRM for notification. See Telephone encounter for: 09/10/18. Pt calling and states pharmacy called him and informed him that a medication was ordered by his PCP called metolazome. Pt states he has not been taking anything for BP and wanted to be sure he is to take this medication? Also pt states the medicine is $130 and he cannot afford the medication if he is to be on it. Chart reviewed with triage nurse, medication not seen on current med list. Please advise.

## 2018-09-10 NOTE — Telephone Encounter (Signed)
I reviewed pts chart, we have no record of sending in metolazone for pt. I believe this is a pharmacy error. I tried to call pts pharmacy several times but no answer.   I tried to call the [phone] number but received a message saying not route could be made.   I left a message on [phone] for pt to call back.

## 2018-09-15 NOTE — Telephone Encounter (Signed)
Tried calling pt again, received message saying no routes found.
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
## 2018-09-18 ENCOUNTER — Inpatient Hospital Stay: Payer: Medicare Other

## 2018-09-18 ENCOUNTER — Encounter: Payer: Self-pay | Admitting: Hematology

## 2018-09-18 ENCOUNTER — Inpatient Hospital Stay: Payer: Medicare Other | Attending: Hematology | Admitting: Hematology

## 2018-09-18 VITALS — BP 107/66 | HR 87 | Temp 97.9°F | Resp 18 | Ht 68.0 in | Wt 277.3 lb

## 2018-09-18 DIAGNOSIS — Z79899 Other long term (current) drug therapy: Secondary | ICD-10-CM | POA: Diagnosis not present

## 2018-09-18 DIAGNOSIS — I130 Hypertensive heart and chronic kidney disease with heart failure and stage 1 through stage 4 chronic kidney disease, or unspecified chronic kidney disease: Secondary | ICD-10-CM | POA: Insufficient documentation

## 2018-09-18 DIAGNOSIS — R188 Other ascites: Secondary | ICD-10-CM | POA: Insufficient documentation

## 2018-09-18 DIAGNOSIS — D508 Other iron deficiency anemias: Secondary | ICD-10-CM | POA: Diagnosis not present

## 2018-09-18 DIAGNOSIS — K7460 Unspecified cirrhosis of liver: Secondary | ICD-10-CM | POA: Diagnosis not present

## 2018-09-18 DIAGNOSIS — I5032 Chronic diastolic (congestive) heart failure: Secondary | ICD-10-CM

## 2018-09-18 DIAGNOSIS — Z9981 Dependence on supplemental oxygen: Secondary | ICD-10-CM | POA: Diagnosis not present

## 2018-09-18 DIAGNOSIS — I2510 Atherosclerotic heart disease of native coronary artery without angina pectoris: Secondary | ICD-10-CM | POA: Diagnosis not present

## 2018-09-18 DIAGNOSIS — C160 Malignant neoplasm of cardia: Secondary | ICD-10-CM

## 2018-09-18 DIAGNOSIS — L03114 Cellulitis of left upper limb: Secondary | ICD-10-CM | POA: Diagnosis not present

## 2018-09-18 DIAGNOSIS — Z95828 Presence of other vascular implants and grafts: Secondary | ICD-10-CM

## 2018-09-18 DIAGNOSIS — E119 Type 2 diabetes mellitus without complications: Secondary | ICD-10-CM

## 2018-09-18 DIAGNOSIS — Z8501 Personal history of malignant neoplasm of esophagus: Secondary | ICD-10-CM | POA: Diagnosis not present

## 2018-09-18 DIAGNOSIS — E1122 Type 2 diabetes mellitus with diabetic chronic kidney disease: Secondary | ICD-10-CM | POA: Diagnosis not present

## 2018-09-18 DIAGNOSIS — K254 Chronic or unspecified gastric ulcer with hemorrhage: Secondary | ICD-10-CM | POA: Insufficient documentation

## 2018-09-18 DIAGNOSIS — G4733 Obstructive sleep apnea (adult) (pediatric): Secondary | ICD-10-CM

## 2018-09-18 DIAGNOSIS — I10 Essential (primary) hypertension: Secondary | ICD-10-CM

## 2018-09-18 LAB — CMP (CANCER CENTER ONLY)
ALT: 23 U/L (ref 0–44)
AST: 26 U/L (ref 15–41)
Albumin: 3 g/dL — ABNORMAL LOW (ref 3.5–5.0)
Alkaline Phosphatase: 76 U/L (ref 38–126)
Anion gap: 8 (ref 5–15)
BUN: 14 mg/dL (ref 8–23)
CO2: 33 mmol/L — ABNORMAL HIGH (ref 22–32)
Calcium: 9.1 mg/dL (ref 8.9–10.3)
Chloride: 97 mmol/L — ABNORMAL LOW (ref 98–111)
Creatinine: 0.96 mg/dL (ref 0.61–1.24)
GFR, Est AFR Am: >60 mL/min (ref >60)
GFR, Estimated: >60 mL/min (ref >60)
Glucose, Bld: 111 mg/dL — ABNORMAL HIGH (ref 70–99)
Potassium: 4 mmol/L (ref 3.5–5.1)
Sodium: 138 mmol/L (ref 135–145)
Total Bilirubin: 0.7 mg/dL (ref 0.3–1.2)
Total Protein: 7.4 g/dL (ref 6.5–8.1)

## 2018-09-18 LAB — CBC WITH DIFFERENTIAL/PLATELET
Abs Immature Granulocytes: 0.04 K/uL (ref 0.00–0.07)
Basophils Absolute: 0.1 K/uL (ref 0.0–0.1)
Basophils Relative: 1 %
Eosinophils Absolute: 0.2 K/uL (ref 0.0–0.5)
Eosinophils Relative: 3 %
HCT: 34 % — ABNORMAL LOW (ref 39.0–52.0)
Hemoglobin: 10 g/dL — ABNORMAL LOW (ref 13.0–17.0)
Immature Granulocytes: 1 %
Lymphocytes Relative: 14 %
Lymphs Abs: 0.9 K/uL (ref 0.7–4.0)
MCH: 27.1 pg (ref 26.0–34.0)
MCHC: 29.4 g/dL — ABNORMAL LOW (ref 30.0–36.0)
MCV: 92.1 fL (ref 80.0–100.0)
Monocytes Absolute: 0.9 K/uL (ref 0.1–1.0)
Monocytes Relative: 14 %
Neutro Abs: 4.4 K/uL (ref 1.7–7.7)
Neutrophils Relative %: 67 %
Platelets: 147 K/uL — ABNORMAL LOW (ref 150–400)
RBC: 3.69 MIL/uL — ABNORMAL LOW (ref 4.22–5.81)
RDW: 18.8 % — ABNORMAL HIGH (ref 11.5–15.5)
WBC: 6.4 K/uL (ref 4.0–10.5)
nRBC: 0 % (ref 0.0–0.2)

## 2018-09-18 MED ORDER — HEPARIN SOD (PORK) LOCK FLUSH 100 UNIT/ML IV SOLN
500.0000 [IU] | Freq: Once | INTRAVENOUS | Status: AC | PRN
  Administered 2018-09-18: 500 [IU] via INTRAVENOUS
  Filled 2018-09-18: qty 5

## 2018-09-18 MED ORDER — SODIUM CHLORIDE 0.9% FLUSH
10.0000 mL | INTRAVENOUS | Status: DC | PRN
  Administered 2018-09-18: 10 mL via INTRAVENOUS
  Filled 2018-09-18: qty 10

## 2018-09-18 MED ORDER — CEPHALEXIN 500 MG PO CAPS: 500.0000 mg | ORAL_CAPSULE | Freq: Three times a day (TID) | ORAL | 0 refills | Status: DC

## 2018-09-29 ENCOUNTER — Other Ambulatory Visit: Payer: Self-pay | Admitting: Family Medicine

## 2018-10-06 ENCOUNTER — Other Ambulatory Visit: Payer: Self-pay | Admitting: Cardiology

## 2018-10-06 ENCOUNTER — Other Ambulatory Visit: Payer: Self-pay | Admitting: Family Medicine

## 2018-10-07 DIAGNOSIS — J449 Chronic obstructive pulmonary disease, unspecified: Secondary | ICD-10-CM | POA: Diagnosis not present

## 2018-10-24 ENCOUNTER — Encounter: Payer: Self-pay | Admitting: Family Medicine

## 2018-10-24 ENCOUNTER — Encounter: Payer: Self-pay | Admitting: *Deleted

## 2018-10-24 ENCOUNTER — Ambulatory Visit (INDEPENDENT_AMBULATORY_CARE_PROVIDER_SITE_OTHER): Payer: Medicare Other | Admitting: Family Medicine

## 2018-10-24 VITALS — BP 114/68 | HR 97 | Temp 98.0°F | Resp 16 | Ht 68.0 in | Wt 277.5 lb

## 2018-10-24 DIAGNOSIS — E662 Morbid (severe) obesity with alveolar hypoventilation: Secondary | ICD-10-CM | POA: Diagnosis not present

## 2018-10-24 DIAGNOSIS — R5381 Other malaise: Secondary | ICD-10-CM | POA: Diagnosis not present

## 2018-10-24 DIAGNOSIS — J9611 Chronic respiratory failure with hypoxia: Secondary | ICD-10-CM | POA: Diagnosis not present

## 2018-10-24 DIAGNOSIS — J449 Chronic obstructive pulmonary disease, unspecified: Secondary | ICD-10-CM

## 2018-10-24 DIAGNOSIS — E118 Type 2 diabetes mellitus with unspecified complications: Secondary | ICD-10-CM

## 2018-10-24 LAB — POCT GLYCOSYLATED HEMOGLOBIN (HGB A1C): Hemoglobin A1C: 6.4 % — AB (ref 4.0–5.6)

## 2018-10-24 NOTE — Progress Notes (Signed)
OFFICE VISIT  11/03/2018   CC:  Chief Complaint  Patient presents with  . Follow-up    RCI, pt is fasting.    HPI:    Patient is a 72 y.o. Caucasian male who presents for 3 mo f/u DM 2  and chronic hypoxic resp failure secondary to obesity hypoventilation syndrome and COPD. He also has chronic diastolic HF and CAD and is managed by Dr. Radford Pax in cardiology. Interim hx: feeling ok, feels unstable gait but admits he does not do much more than sit and watch TV. He uses a rollator.  No recent falls.  DM: Taking metformin+ 16 U N qAM and 16 U qSupper, as well as 20 U novolog with BF and 20 U with supper.  Gluc's "180-190 fasting for some periods, other periods 130s-140s.  Before supper always <200. Two liters oxygen 24/7.  No CP. No SOB at rest, but with 5-10 steps walking on flat surface he gets SOB. Appetite ok.  No trouble swallowing.  Bowels moving well.  Most recent f/u with oncologist was 09/18/18:  Scans showed no signs concerning for recurrence or metastatic cancer.  Anemia, hx of upper GI bleeding from gastric ulcer-->"Related to recent GI Bleeding from gastric ulcer found on 03/19/18 Upper endoscopy -He was given blood transfusion on 03/19/18  -I recommend he increase iron in diet and take OTC multivitamin with iron. He is agreeable.  -mild and stable"  Anxiety: stable on SSRI and clonazepam 1-2 tabs a day.  Past Medical History:  Diagnosis Date  . Acute upper GI bleed 02/2018   Transfused 4 U pRBCs-->EGD showed an esoph ulcer and some gastric ulcers (?radiation-induced), path showed no malignancy or Barrett's.  Rpt EGD 04/28/18: Grade I and small (< 5 mm) distal esophageal varices.  Esoph ulcer healed.  Mild portal hypert gastrop.  No sign of malignancy.  . Asthma    as a child  . CAD in native artery    a. cardiac cath 09/2013 showed severely diseased mLAD and diagonal, mild LCx disease, moderate RCA disease, LVEDP 20, EF 50%.  . Cataract    multiple types, bilateral  .  Cholelithiasis without cholecystitis   . Chronic diastolic CHF (congestive heart failure) ([place]) 02/25/2009  . Chronic renal insufficiency, stage III (moderate) (HCC) 2017   Stage II/III (GFR around 60)  . 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  CT 11/2017 w/ascites and persistent bilat pleural effusions  . 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 COPD (chronic obstructive pulmonary disease) ([place])   . Coronary artery disease    chronically occluded LAD and diagonal with right to left collaterals, mild disease in the left circ and moderate disease in the mid RCA on medical management with Imdur, ASA, and Plavix.  . Dermatitis 05/2014  . DIABETES MELLITUS, TYPE II 06/24/2007   No diab retpthy as of 08/05/15 eye exam.  . Esophageal cancer ([place]) 04/2016   poorly differentiated carcinoma (Dr. Pyrtle--EGD).  CT C/A/P showed metastatic adenopathy in mediastinum and upper abdomen 05/09/16.  Tx plan is palliative radiation (completed 06/22/16), then palliative systemic chemotherapy (carbo+taxol) was started but as of 08/03/16 onc f/u this was held due to severe knee and ankle arthralgias.  Restarting as of 10/2015 (08/2016 CT showed some disease regression  . Esophageal cancer ([place])    CT 12/2016 showed no residual esoph mass--plan to continue chemo.  Ongoing palliative chemo with carboplatin and  taxol q 2 wks as of 03/2017 onc f/u (CT 12/2016 and 04/2017 showed no progression of dz).  Taking a 2 mo break from chemo as of 05/2017.  Pleural effusion-- improved with lasix but onc wanted him to get dx/therap thoracentesis-pt declined.  CT C/A/P no progression/recurr/mets 11/2017.  Marland Kitchen Esophageal cancer ([place])    06/16/18 surveillance CTs showed no evidence of dz recurrence.  . Essential hypertension 05/01/2007   Qualifier: Diagnosis of  By: Tiney Rouge CMA, Ellison Hughs     . History of kidney stones   . Hyperkalemia 12/2015   Decreased ACE-I by 50% in  response, then potassium normalized.  Marland Kitchen HYPERLIPIDEMIA 06/24/2007   03/2018 lipids at goal  . Hypoxemia 01/27/2010  . Morbid obesity ([place])   . Myocardial infarction [facility])    pt states he was informed per MD that he has had one but pt was unaware   . Obesity hypoventilation syndrome (HCC)    oxygen 24/7 (2 liters [place] as of 02/2016)  . On home oxygen therapy    Oxygen @ 2l/m nasally 24/7 hours  . OSA (obstructive sleep apnea)    not tested; pt scored 4 per stop bang tool results sent to PCP   . OSTEOARTHRITIS 05/01/2007  . Pancytopenia due to chemotherapy ([place]) 2018  . Pleural effusion on right 08/2017   ? malignant vs CHF: pt refuses diagnostic thoracentesis b/c he states he feels fine, wants to try diuretic first.  . Pleural effusion on right 09/2017   Thoracentesis  . Presbycusis of both ears 05/2015   [facility]  . Pruritic condition 05/2014   Allergist summer 2015, no new testing.  Marland Kitchen RBBB   . Visual field defect 05/10/2016   Per Dr. Melina Fiddler, O.D.: Rt, loss of inf/temp quad and some loss sup/temp.  ?CVA  ? Pituitary tumor? 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    Past Surgical History:  Procedure Laterality Date  . APPENDECTOMY    . BALLOON DILATION N/A 05/04/2016   Procedure: BALLOON DILATION;  Surgeon: Jerene Bears, MD;  Location: [facility];  Service: Gastroenterology;  Laterality: N/A;  . BIOPSY  03/19/2018   Procedure: BIOPSY;  Surgeon: Jackquline Denmark, MD;  Location: [facility];  Service: Endoscopy;;  . CARDIAC CATHETERIZATION    . CARPAL TUNNEL RELEASE Left   . CATARACT EXTRACTION W/PHACO Right 04/29/2013   Procedure: CATARACT EXTRACTION PHACO AND INTRAOCULAR LENS PLACEMENT (IOC);  Surgeon: Adonis Brook, MD;  Location: [facility];  Service: Ophthalmology;  Laterality: Right;  . CATARACT EXTRACTION, BILATERAL    . COLONOSCOPY W/ POLYPECTOMY  08/2011   Many polyps--all hyperplastic, severe diverticulosis, int hem.  [facility] hemoccult testing via [facility] 06/22/15 NEG  . ESOPHAGOGASTRODUODENOSCOPY N/A 05/04/2016   Procedure: ESOPHAGOGASTRODUODENOSCOPY (EGD);  Surgeon: Jerene Bears, MD;  Location: [facility];  Service: Gastroenterology;  Laterality: N/A;  . ESOPHAGOGASTRODUODENOSCOPY (EGD) WITH PROPOFOL N/A 03/19/2018   Gastric ulcers, esoph ulcer (radiation-induced?), bx-->path neg for malignancy or Barrett's.  GI plans repeat EGD 6-8 wks after 1st.   Procedure: ESOPHAGOGASTRODUODENOSCOPY (EGD) WITH PROPOFOL;  Surgeon: Jackquline Denmark, MD;  [facility]: [facility];  Service: Endoscopy;  Laterality: N/A;  . ESOPHAGOGASTRODUODENOSCOPY (EGD) WITH PROPOFOL N/A 04/28/2018   Esoph ulcer healed.  Grade I and small (< 5 mm) distal esophageal varices.  Mild portal hypertensive gastropathy.  No evidence of malignance.  Procedure: ESOPHAGOGASTRODUODENOSCOPY (EGD) WITH PROPOFOL;  Surgeon: Jerene Bears, MD;  [facility];  Service: Gastroenterology;  Laterality: N/A;  . IR GENERIC HISTORICAL  10/03/2016   IR US GUIDE VASC ACCESS RIGHT 10/03/2016 Greggory Keen, MD [facility]  . IR GENERIC HISTORICAL  10/03/2016   IR FLUORO GUIDE PORT INSERTION RIGHT 10/03/2016 Greggory Keen, MD [facility]  . KNEE ARTHROSCOPY WITH MEDIAL MENISECTOMY Right 12/16/2014   Procedure: RIGHT KNEE ARTHROSCOPY WITH MEDIAL MENISECTOMY microfracture medial femoral condyle abrasion condroplasty medial femoral condyle lateral menisectomy;  Surgeon: Tobi Bastos, MD;  Location: [facility];  Service: Orthopedics;  Laterality: Right;  . LEFT HEART CATHETERIZATION WITH CORONARY ANGIOGRAM N/A 10/26/2013   Procedure: LEFT HEART CATHETERIZATION WITH CORONARY ANGIOGRAM;  Surgeon: Jettie Booze, MD;  Location: [facility] CATH LAB;  Service: Cardiovascular;  Laterality: N/A;  . LUMBAR [place] SURGERY  2001  . SHOULDER SURGERY Right    right fx  . TONSILLECTOMY    . TRANSTHORACIC ECHOCARDIOGRAM  03/2016   EF 55-60%, grade I DD, LAE  . WRIST SURGERY Right    right fx    Outpatient Medications  Prior to Visit  Medication Sig Dispense Refill  . atorvastatin (LIPITOR) 80 MG tablet TAKE 1 TABLET BY MOUTH ONCE DAILY 90 tablet 1  . baclofen (LIORESAL) 10 MG tablet 1/2 - 1 tab po q8h prn muscle spasms (Patient taking differently: Take 5-10 mg by mouth every 8 (eight) hours as needed for muscle spasms. ) 30 each 1  . beta carotene w/minerals (OCUVITE) tablet Take 1 tablet by mouth daily.    . cetirizine (ZYRTEC) 10 MG tablet Take 10 mg by mouth every evening.     . citalopram (CELEXA) 20 MG tablet TAKE 1 TABLET BY MOUTH ONCE DAILY 90 tablet 0  . clonazePAM (KLONOPIN) 1 MG tablet Take 1 tablet (1 mg total) by mouth 2 (two) times daily as needed. for anxiety 60 tablet 5  . clotrimazole-betamethasone (LOTRISONE) cream Apply 1 application topically 2 (two) times daily.    Marland Kitchen ezetimibe (ZETIA) 10 MG tablet TAKE 1 TABLET BY MOUTH ONCE DAILY (Patient taking differently: TAKE 1 TABLET BY MOUTH ONCE DAILY AT BEDTIME) 90 tablet 3  . furosemide (LASIX) 20 MG tablet TAKE 1 TABLET BY MOUTH ONCE DAILY 90 tablet 1  . gabapentin (NEURONTIN) 300 MG capsule TAKE ONE CAPSULE BY MOUTH THREE TIMES DAILY 270 capsule 3  . HUMULIN N KWIKPEN 100 UNIT/ML Kiwkpen INJECT 16 UNITS SUBCUTANEOUSLY IN THE MORNING AND 16 IN THE EVENING 30 pen 1  . hydrOXYzine (ATARAX/VISTARIL) 25 MG tablet TAKE ONE TABLET BY MOUTH AT SUPPER AND 1 TABLET AT BEDTIME FOR INSOMNIA 60 tablet 6  . Insulin Lispro (HUMALOG KWIKPEN) 200 UNIT/ML SOPN Inject 20 Units into the skin 2 (two) times daily. 18 pen 1  . magnesium chloride (SLOW-MAG) 64 MG TBEC SR tablet Take 1 tablet (64 mg total) by mouth daily. 60 tablet 0  . metFORMIN (GLUCOPHAGE) 1000 MG tablet TAKE 1 TABLET BY MOUTH TWICE DAILY WITH MEALS 180 tablet 1  . Omega-3 Fatty Acids (FISH OIL) 1200 MG CAPS Take 2,400 mg by mouth 2 (two) times daily.    . ONE TOUCH ULTRA TEST test strip USE 1 STRIP TO CHECK GLUCOSE THREE TIMES DAILY AS DIRECTED 100 each 11  . ONETOUCH DELICA LANCETS 63S MISC USE   TO  CHECK GLUCOSE THREE TIMES DAILY 100 each 11  . OXYGEN Inhale 2 L into the lungs continuous.     . pantoprazole (PROTONIX) 40 MG tablet TAKE 1 TABLET BY MOUTH TWICE DAILY 180 tablet 2  . cephALEXin (KEFLEX)  500 MG capsule Take 1 capsule (500 mg total) by mouth 3 (three) times daily. (Patient not taking: Reported on 10/24/2018) 21 capsule 0  . clotrimazole-betamethasone (LOTRISONE) cream Apply to affected area twice daily (Patient not taking: Reported on 10/24/2018) 45 g 2  . cromolyn (OPTICROM) 4 % ophthalmic solution Place 1 drop into both eyes. 3 to 4 times daily     Facility-Administered Medications Prior to Visit  Medication Dose Route Frequency Provider Last Rate Last Dose  . sodium chloride flush (NS) 0.9 % injection 10 mL  10 mL Intravenous PRN Truitt Merle, MD   10 mL at 04/16/17 1459  . sodium chloride flush (NS) 0.9 % injection 10 mL  10 mL Intravenous PRN Truitt Merle, MD   10 mL at 05/02/17 1725    No Known Allergies  ROS As per HPI  PE: Blood pressure 114/68, pulse 97, temperature 98 F (36.7 C), temperature source Oral, resp. rate 16, height '5\' 8"'  (1.727 m), weight 277 lb 8 oz (125.9 kg), SpO2 95 %. Gen: alert, chronically ill-appearing, NAD, HOH. AFFECT: pleasant, lucid thought and speech. CV: RRR, rate about 90, distant S1 and S2, no audible murmur or rub. Lungs CTA bilat, nonlabored resps.  He has e to a changes in R base. EXT: R LL pitting 1-2+, L LL pitting trace-1+.  He is wearing compression stockings.  LABS:  Lab Results  Component Value Date   TSH 2.940 08/28/2016   Lab Results  Component Value Date   WBC 6.4 09/18/2018   HGB 10.0 (L) 09/18/2018   HCT 34.0 (L) 09/18/2018   MCV 92.1 09/18/2018   PLT 147 (L) 09/18/2018   Lab Results  Component Value Date   CREATININE 0.96 09/18/2018   BUN 14 09/18/2018   NA 138 09/18/2018   K 4.0 09/18/2018   CL 97 (L) 09/18/2018   CO2 33 (H) 09/18/2018   Lab Results  Component Value Date   ALT 23 09/18/2018   AST 26  09/18/2018   ALKPHOS 76 09/18/2018   BILITOT 0.7 09/18/2018   Lab Results  Component Value Date   CHOL 97 (L) 04/24/2018   Lab Results  Component Value Date   HDL 36 (L) 04/24/2018   Lab Results  Component Value Date   LDLCALC 32 04/24/2018   Lab Results  Component Value Date   TRIG 144 04/24/2018   Lab Results  Component Value Date   CHOLHDL 2.7 04/24/2018   Lab Results  Component Value Date   HGBA1C 6.4 (A) 10/24/2018   POC HbA1c today=6.4%  IMPRESSION AND PLAN:  1) DM 2: control is good. POC a1c today=6.5%. No changes in DM med regimen.  2) Chronic hypoxic resp failure secondary to severe COPD and obesity hypoventilation syndrome: Stable.   Continue 2L oxygen 24/7.  3) Anxiety, chronic:  Doing fine.  Continue citalopram 77m qd and clonazepam 181mbid prn. Renewed CSC today (clonaz).  4) Gait instability: chronic debilitation from multiple comorbidities, including obesity. Continue rollator.  5) Esophageal cancer: he is s/p palliative radiation and chemo.  Imaging showed no sign of progression or mets 05/2018.  An After Visit Summary was printed and given to the patient.  FOLLOW UP: Return in about 3 months (around 01/23/2019) for routine chronic illness f/u.  Signed:  PhCrissie SicklesMD           11/03/2018

## 2018-10-30 ENCOUNTER — Inpatient Hospital Stay: Payer: Medicare Other | Attending: Hematology

## 2018-10-30 DIAGNOSIS — D508 Other iron deficiency anemias: Secondary | ICD-10-CM | POA: Diagnosis not present

## 2018-10-30 DIAGNOSIS — Z8501 Personal history of malignant neoplasm of esophagus: Secondary | ICD-10-CM | POA: Diagnosis not present

## 2018-10-30 DIAGNOSIS — Z95828 Presence of other vascular implants and grafts: Secondary | ICD-10-CM

## 2018-10-30 MED ORDER — SODIUM CHLORIDE 0.9% FLUSH
10.0000 mL | INTRAVENOUS | Status: DC | PRN
  Administered 2018-10-30: 10 mL via INTRAVENOUS
  Filled 2018-10-30: qty 10

## 2018-10-30 MED ORDER — HEPARIN SOD (PORK) LOCK FLUSH 100 UNIT/ML IV SOLN
500.0000 [IU] | Freq: Once | INTRAVENOUS | Status: AC | PRN
  Administered 2018-10-30: 500 [IU] via INTRAVENOUS
  Filled 2018-10-30: qty 5

## 2018-11-07 DIAGNOSIS — J449 Chronic obstructive pulmonary disease, unspecified: Secondary | ICD-10-CM | POA: Diagnosis not present

## 2018-11-20 ENCOUNTER — Ambulatory Visit: Payer: Medicare Other | Admitting: Cardiology

## 2018-11-20 ENCOUNTER — Encounter: Payer: Self-pay | Admitting: Cardiology

## 2018-11-20 VITALS — BP 122/64 | HR 86 | Ht 68.0 in | Wt 273.0 lb

## 2018-11-20 DIAGNOSIS — I5032 Chronic diastolic (congestive) heart failure: Secondary | ICD-10-CM | POA: Diagnosis not present

## 2018-11-20 DIAGNOSIS — R0602 Shortness of breath: Secondary | ICD-10-CM

## 2018-11-20 DIAGNOSIS — I10 Essential (primary) hypertension: Secondary | ICD-10-CM

## 2018-11-20 DIAGNOSIS — E785 Hyperlipidemia, unspecified: Secondary | ICD-10-CM

## 2018-11-20 DIAGNOSIS — I2510 Atherosclerotic heart disease of native coronary artery without angina pectoris: Secondary | ICD-10-CM | POA: Diagnosis not present

## 2018-11-20 LAB — PRO B NATRIURETIC PEPTIDE: NT-Pro BNP: 90 pg/mL (ref 0–376)

## 2018-11-20 NOTE — Progress Notes (Signed)
Cardiology Office Note:    Date:  11/20/2018   ID:  [name], DOB August 14, 1946, MRN [id number]  PCP:  [name] Sou, MD  Cardiologist:  No primary care provider on file.    Referring MD: [name] Sou, MD   Chief Complaint  Patient presents with  . Coronary Artery Disease  . Hypertension  . Hyperlipidemia    History of Present Illness:    [name] is a 73 y.o. male with a hx of obesity hypoventilation syndrome on home O2, HTN, dyslipidemia, morbid obesity and chronic LE edema which is controlled with TED hose stockings and diuretic. He has chronic diastolic CHF controlled on diuretics. 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  He is DNR/DNI.    He is here today for followup and is doing well.  He denies any chest pain or pressure, SOB, DOE, PND, orthopnea, LE edema, dizziness, palpitations or syncope. He is compliant with his meds and is tolerating meds with no SE.    Past Medical History:  Diagnosis Date  . Acute upper GI bleed 02/2018   Transfused 4 U pRBCs-->EGD showed an esoph ulcer and some gastric ulcers (?radiation-induced), path showed no malignancy or Barrett's.  Rpt EGD 04/28/18: Grade I and small (< 5 mm) distal esophageal varices.  Esoph ulcer healed.  Mild portal hypert gastrop.  No sign of malignancy.  . Asthma    as a child  . CAD in native artery    a. cardiac cath 09/2013 showed severely diseased mLAD and diagonal, mild LCx disease, moderate RCA disease, LVEDP 20, EF 50%.  . Cataract    multiple types, bilateral  . Cholelithiasis without cholecystitis   . Chronic diastolic CHF (congestive heart failure) ([place]) 02/25/2009  . Chronic renal insufficiency, stage III (moderate) (HCC) 2017   Stage  II/III (GFR around 60)  . 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  CT 11/2017 w/ascites and persistent bilat pleural effusions  . 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 COPD (chronic obstructive pulmonary disease) ([place])   . Coronary artery disease    chronically occluded LAD and diagonal with right to left collaterals, mild disease in the left circ and moderate disease in the mid RCA on medical management with Imdur, ASA, and Plavix.  . Dermatitis 05/2014  . DIABETES MELLITUS, TYPE II 06/24/2007   No diab retpthy as of 08/05/15 eye exam.  . 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 Pyrtle--EGD).  CT C/A/P showed metastatic adenopathy in mediastinum and upper abdomen 05/09/16.  Tx plan is palliative radiation (completed 06/22/16), then palliative systemic chemotherapy (carbo+taxol) was started but as of 08/03/16 onc f/u this was held due to severe knee and ankle arthralgias.  Restarting as of 10/2015 (08/2016 CT showed some disease regression  . Esophageal cancer ([place])    CT 12/2016 showed no residual esoph mass--plan to continue chemo.  Ongoing palliative chemo with carboplatin and taxol q 2 wks as of 03/2017 onc f/u (CT 12/2016 and 04/2017 showed no progression of dz).  Taking a 2 mo break from chemo as of 05/2017.  Pleural effusion-- improved with lasix but onc wanted  him to get dx/therap thoracentesis-pt declined.  CT C/A/P no progression/recurr/mets 11/2017.  Marland Kitchen Esophageal cancer ([place])    06/16/18 surveillance CTs showed no evidence of dz recurrence.  . Essential hypertension 05/01/2007   Qualifier: Diagnosis of  By: Tiney Rouge CMA, Ellison Hughs     . History of kidney stones   . Hyperkalemia 12/2015   Decreased ACE-I by 50% in response, then potassium normalized.  Marland Kitchen HYPERLIPIDEMIA 06/24/2007   03/2018 lipids at goal  . Hypoxemia 01/27/2010  . Morbid obesity ([place])   . Myocardial infarction [facility])    pt states  he was informed per MD that he has had one but pt was unaware   . Obesity hypoventilation syndrome (HCC)    oxygen 24/7 (2 liters [place] as of 02/2016)  . On home oxygen therapy    Oxygen @ 2l/m nasally 24/7 hours  . OSA (obstructive sleep apnea)    not tested; pt scored 4 per stop bang tool results sent to PCP   . OSTEOARTHRITIS 05/01/2007  . Pancytopenia due to chemotherapy ([place]) 2018  . Pleural effusion on right 08/2017   ? malignant vs CHF: pt refuses diagnostic thoracentesis b/c he states he feels fine, wants to try diuretic first.  . Pleural effusion on right 09/2017   Thoracentesis  . Presbycusis of both ears 05/2015   [facility]  . Pruritic condition 05/2014   Allergist summer 2015, no new testing.  Marland Kitchen RBBB   . Visual field defect 05/10/2016   Per Dr. Melina Fiddler, O.D.: Rt, loss of inf/temp quad and some loss sup/temp.  ?CVA  ? Pituitary tumor? 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    Past Surgical History:  Procedure Laterality Date  . APPENDECTOMY    . BALLOON DILATION N/A 05/04/2016   Procedure: BALLOON DILATION;  Surgeon: Jerene Bears, MD;  Location: [facility];  Service: Gastroenterology;  Laterality: N/A;  . BIOPSY  03/19/2018   Procedure: BIOPSY;  Surgeon: Jackquline Denmark, MD;  Location: [facility];  Service: Endoscopy;;  . CARDIAC CATHETERIZATION    . CARPAL TUNNEL RELEASE Left   . CATARACT EXTRACTION W/PHACO Right 04/29/2013   Procedure: CATARACT EXTRACTION PHACO AND INTRAOCULAR LENS PLACEMENT (IOC);  Surgeon: Adonis Brook, MD;  Location: [place];  Service: Ophthalmology;  Laterality: Right;  . CATARACT EXTRACTION, BILATERAL    . COLONOSCOPY W/ POLYPECTOMY  08/2011   Many polyps--all hyperplastic, severe diverticulosis, int hem.  [employer] hemoccult testing via [facility] 06/22/15 NEG  . ESOPHAGOGASTRODUODENOSCOPY N/A 05/04/2016   Procedure: ESOPHAGOGASTRODUODENOSCOPY (EGD);  Surgeon: Jerene Bears, MD;  Location: [facility];  Service:  Gastroenterology;  Laterality: N/A;  . ESOPHAGOGASTRODUODENOSCOPY (EGD) WITH PROPOFOL N/A 03/19/2018   Gastric ulcers, esoph ulcer (radiation-induced?), bx-->path neg for malignancy or Barrett's.  GI plans repeat EGD 6-8 wks after 1st.   Procedure: ESOPHAGOGASTRODUODENOSCOPY (EGD) WITH PROPOFOL;  Surgeon: Jackquline Denmark, MD;  [facility];  Service: Endoscopy;  Laterality: N/A;  . ESOPHAGOGASTRODUODENOSCOPY (EGD) WITH PROPOFOL N/A 04/28/2018   Esoph ulcer healed.  Grade I and small (< 5 mm) distal esophageal varices.  Mild portal hypertensive gastropathy.  No evidence of malignance.  Procedure: ESOPHAGOGASTRODUODENOSCOPY (EGD) WITH PROPOFOL;  Surgeon: Jerene Bears, MD;  [facility];  Service: Gastroenterology;  Laterality: N/A;  . IR GENERIC HISTORICAL  10/03/2016   IR US GUIDE VASC ACCESS RIGHT 10/03/2016 Greggory Keen, MD [facility]  . IR GENERIC HISTORICAL  10/03/2016   IR FLUORO GUIDE PORT INSERTION  RIGHT 10/03/2016 Greggory Keen, MD [facility]  . KNEE ARTHROSCOPY WITH MEDIAL MENISECTOMY Right 12/16/2014   Procedure: RIGHT KNEE ARTHROSCOPY WITH MEDIAL MENISECTOMY microfracture medial femoral condyle abrasion condroplasty medial femoral condyle lateral menisectomy;  Surgeon: Tobi Bastos, MD;  [facility];  Service: Orthopedics;  Laterality: Right;  . LEFT HEART CATHETERIZATION WITH CORONARY ANGIOGRAM N/A 10/26/2013   Procedure: LEFT HEART CATHETERIZATION WITH CORONARY ANGIOGRAM;  Surgeon: Jettie Booze, MD;  Location: [facility] CATH LAB;  Service: Cardiovascular;  Laterality: N/A;  . LUMBAR [place] SURGERY  2001  . SHOULDER SURGERY Right    right fx  . TONSILLECTOMY    . TRANSTHORACIC ECHOCARDIOGRAM  03/2016   EF 55-60%, grade I DD, LAE  . WRIST SURGERY Right    right fx    Current Medications: Current Meds  Medication Sig  . atorvastatin (LIPITOR) 80 MG tablet TAKE 1 TABLET BY MOUTH ONCE DAILY  . baclofen (LIORESAL) 10 MG tablet 1/2 - 1 tab po q8h prn muscle  spasms (Patient taking differently: Take 5-10 mg by mouth every 8 (eight) hours as needed for muscle spasms. )  . beta carotene w/minerals (OCUVITE) tablet Take 1 tablet by mouth daily.  . cetirizine (ZYRTEC) 10 MG tablet Take 10 mg by mouth every evening.   . citalopram (CELEXA) 20 MG tablet TAKE 1 TABLET BY MOUTH ONCE DAILY  . clonazePAM (KLONOPIN) 1 MG tablet Take 1 tablet (1 mg total) by mouth 2 (two) times daily as needed. for anxiety  . clotrimazole-betamethasone (LOTRISONE) cream Apply 1 application topically 2 (two) times daily.  Marland Kitchen ezetimibe (ZETIA) 10 MG tablet TAKE 1 TABLET BY MOUTH ONCE DAILY (Patient taking differently: TAKE 1 TABLET BY MOUTH ONCE DAILY AT BEDTIME)  . furosemide (LASIX) 20 MG tablet TAKE 1 TABLET BY MOUTH ONCE DAILY  . gabapentin (NEURONTIN) 300 MG capsule TAKE ONE CAPSULE BY MOUTH THREE TIMES DAILY  . HUMULIN N KWIKPEN 100 UNIT/ML Kiwkpen INJECT 16 UNITS SUBCUTANEOUSLY IN THE MORNING AND 16 IN THE EVENING  . hydrOXYzine (ATARAX/VISTARIL) 25 MG tablet TAKE ONE TABLET BY MOUTH AT SUPPER AND 1 TABLET AT BEDTIME FOR INSOMNIA  . Insulin Lispro (HUMALOG KWIKPEN) 200 UNIT/ML SOPN Inject 20 Units into the skin 2 (two) times daily.  . magnesium chloride (SLOW-MAG) 64 MG TBEC SR tablet Take 1 tablet (64 mg total) by mouth daily.  . metFORMIN (GLUCOPHAGE) 1000 MG tablet TAKE 1 TABLET BY MOUTH TWICE DAILY WITH MEALS  . Omega-3 Fatty Acids (FISH OIL) 1200 MG CAPS Take 2,400 mg by mouth 2 (two) times daily.  . ONE TOUCH ULTRA TEST test strip USE 1 STRIP TO CHECK GLUCOSE THREE TIMES DAILY AS DIRECTED  . ONETOUCH DELICA LANCETS 50D MISC USE   TO CHECK GLUCOSE THREE TIMES DAILY  . OXYGEN Inhale 2 L into the lungs continuous.   . pantoprazole (PROTONIX) 40 MG tablet TAKE 1 TABLET BY MOUTH TWICE DAILY     Allergies:   Patient has no known allergies.   Social History   Socioeconomic History  . Marital status: Married    Spouse name: susan  . Number of children: 2  . Years of  education: Not on file  . Highest education level: Not on file  Occupational History  . Occupation: Retired  Scientific laboratory technician  . Financial resource strain: Not on file  . Food insecurity:    Worry: Not on file    Inability: Not on file  . Transportation needs:    Medical: Not on  file    Non-medical: Not on file  Tobacco Use  . Smoking status: Former Smoker    Packs/day: 1.50    Years: 30.00    Pack years: 45.00    Types: Cigarettes, Pipe, Cigars    Last attempt to quit: 10/30/1983    Years since quitting: 35.0  . Smokeless tobacco: Current User    Types: Chew    Last attempt to quit: 05/15/2016  Substance and Sexual Activity  . Alcohol use: No  . Drug use: No  . Sexual activity: Not Currently  Lifestyle  . Physical activity:    Days per week: Not on file    Minutes per session: Not on file  . Stress: Not on file  Relationships  . Social connections:    Talks on phone: Not on file    Gets together: Not on file    Attends religious service: Not on file    Active member of club or organization: Not on file    Attends meetings of clubs or organizations: Not on file    Relationship status: Not on file  Other Topics Concern  . Not on file  Social History Narrative   Married, one son and one daughter.   His daughter and her two children live with him.   Coffee daily.  Former smoker.  No alcohol.   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   Attends church weekly-   Oxygen continuous     Family History: The patient's family history includes Alcohol abuse in his father; Aneurysm in his father; Diabetes in his maternal aunt; Heart attack in his mother. There is no history of Colon cancer.  ROS:   Please see the history of present illness.    ROS  All other systems reviewed and negative.   EKGs/Labs/Other Studies Reviewed:    The following studies were reviewed today: none  EKG:  EKG is  ordered today.  The ekg ordered today demonstrates NSR at  86bpm with occasional PVCs and RBBB  Recent Labs: 09/18/2018: ALT 23; BUN 14; Creatinine 0.96; Hemoglobin 10.0; Platelets 147; Potassium 4.0; Sodium 138   Recent Lipid Panel    Component Value Date/Time   CHOL 97 (L) 04/24/2018 0951   TRIG 144 04/24/2018 0951   TRIG 268 (HH) 09/06/2006 1555   HDL 36 (L) 04/24/2018 0951   CHOLHDL 2.7 04/24/2018 0951   CHOLHDL 3.6 03/12/2016 1001   VLDL 37 (H) 03/12/2016 1001   LDLCALC 32 04/24/2018 0951   LDLDIRECT 74.0 04/15/2015 0838    Physical Exam:    VS:  BP 122/64   Pulse 86   Ht '5\' 8"'  (1.727 m)   Wt 273 lb (123.8 kg)   BMI 41.51 kg/m     Wt Readings from Last 3 Encounters:  11/20/18 273 lb (123.8 kg)  10/24/18 277 lb 8 oz (125.9 kg)  09/18/18 277 lb 4.8 oz (125.8 kg)     GEN:  Well nourished, well developed in no acute distress HEENT: Normal NECK: No JVD; No carotid bruits LYMPHATICS: No lymphadenopathy CARDIAC: RRR, no murmurs, rubs, gallops RESPIRATORY:  Clear to auscultation without rales, wheezing or rhonchi  ABDOMEN: Soft, non-tender, non-distended MUSCULOSKELETAL:  No edema; No deformity  SKIN: Warm and dry NEUROLOGIC:  Alert and oriented x 3 PSYCHIATRIC:  Normal affect   ASSESSMENT:    1. Atherosclerosis of native coronary artery of native heart without angina pectoris   2. Essential hypertension   3. Chronic  diastolic CHF (congestive heart failure) ([place])   4. Dyslipidemia    PLAN:    In order of problems listed above:  1.  ASCAD - severely diseased and chronically occluded LAD and diagonal with right to left collaterals, mild disease in the left circ and moderate disease in the mid RCA on medical management with Imdur and Plavix.He denies any anginal sx.  He will continue on statin.  No BB due to O2 dependent COPD.  He is not on ASA due to GE junction CA.  I will send a note to his oncologist to see if we can restart this since his chemo is done.  2.  HTN - BP is well controlled on exam today.  He is not on  any antihypertensive meds.  3.  Chronic diastolic CHF - he appears euvolemic on exam today but has been having more SOB and now is sitting up at night to be able to breath when sleeping even with his O2.  He has no LE edema and lungs are clear  But with reduced airflow.  I will refer him to pulmonary and check a BNP and echo.Marland Kitchen  He will continue on lasix 57m daily.  His creatinine was 0.96 on 08/2018.  4.  Hyperlipidemia - LDL goal is < 70.  His LDL was 32 on 03/2018. He will continue on Atorvastatin 845mdaily and Zetia 1055maily.     Medication Adjustments/Labs and Tests Ordered: Current medicines are reviewed at length with the patient today.  Concerns regarding medicines are outlined above.  Orders Placed This Encounter  Procedures  . EKG 12-Lead   No orders of the defined types were placed in this encounter.   Signed, TraFransico HimD  11/20/2018 8:32 AM    [facility]

## 2018-11-20 NOTE — Patient Instructions (Signed)
Medication Instructions:  Your physician recommends that you continue on your current medications as directed. Please refer to the Current Medication list given to you today.  If you need a refill on your cardiac medications before your next appointment, please call your pharmacy.   Lab work: Today: ProBNP  If you have labs (blood work) drawn today and your tests are completely normal, you will receive your results only by: Marland Kitchen MyChart Message (if you have MyChart) OR . A paper copy in the mail If you have any lab test that is abnormal or we need to change your treatment, we will call you to review the results.  Testing/Procedures: Your physician has requested that you have an echocardiogram. Echocardiography is a painless test that uses sound waves to create images of your heart. It provides your doctor with information about the size and shape of your heart and how well your heart's chambers and valves are working. This procedure takes approximately one hour. There are no restrictions for this procedure.  Follow-Up: At [facility], you and your health needs are our priority.  As part of our continuing mission to provide you with exceptional heart care, we have created designated Provider Care Teams.  These Care Teams include your primary Cardiologist (physician) and Advanced Practice Providers (APPs -  Physician Assistants and Nurse Practitioners) who all work together to provide you with the care you need, when you need it. You will need a follow up appointment in 6 months.  Please call our office 2 months in advance to schedule this appointment.  You may see Dr. Radford Pax or one of the following Advanced Practice Providers on your designated Care Team:   [place], PA-C Melina Copa, PA-C . Ermalinda Barrios, PA-C  You have been referred to Pulmonology with Dr. Lake Bells

## 2018-11-22 ENCOUNTER — Other Ambulatory Visit: Payer: Self-pay | Admitting: Nurse Practitioner

## 2018-11-22 ENCOUNTER — Encounter: Payer: Self-pay | Admitting: Hematology

## 2018-11-23 ENCOUNTER — Encounter: Payer: Self-pay | Admitting: Family Medicine

## 2018-11-25 ENCOUNTER — Ambulatory Visit (HOSPITAL_COMMUNITY): Payer: Medicare Other | Attending: Cardiology

## 2018-11-25 DIAGNOSIS — R0602 Shortness of breath: Secondary | ICD-10-CM | POA: Insufficient documentation

## 2018-11-25 MED ORDER — PERFLUTREN LIPID MICROSPHERE
1.0000 mL | INTRAVENOUS | Status: AC | PRN
  Administered 2018-11-25: 2 mL via INTRAVENOUS

## 2018-11-30 ENCOUNTER — Other Ambulatory Visit: Payer: Self-pay | Admitting: Family Medicine

## 2018-12-01 NOTE — Telephone Encounter (Signed)
RF request for gabapentin LOV: 10/24/18 Next ov: 01/23/19 Last written: 11/18/17 #270 w/ 3RF  Please advise. Thanks.

## 2018-12-05 ENCOUNTER — Other Ambulatory Visit: Payer: Self-pay | Admitting: Family Medicine

## 2018-12-08 DIAGNOSIS — J449 Chronic obstructive pulmonary disease, unspecified: Secondary | ICD-10-CM | POA: Diagnosis not present

## 2018-12-09 ENCOUNTER — Encounter: Payer: Self-pay | Admitting: Family Medicine

## 2018-12-09 ENCOUNTER — Telehealth: Payer: Self-pay

## 2018-12-09 DIAGNOSIS — R0602 Shortness of breath: Secondary | ICD-10-CM

## 2018-12-09 NOTE — Telephone Encounter (Signed)
Notes recorded by Sarina Ill, RN on 12/09/2018 at 8:30 AM EST The patient has been notified of the result and verbalized understanding. All questions (if any) were answered. He stated that his SOB is still continuing and he has trouble walking across the room. He stated he can bathe for 20 minutes but after he is very SOB. The patient is going to call his insurance company to determine the cost of the test to see if he wants to continue.  Sarina Ill, RN 12/09/2018 8:28 AM

## 2018-12-09 NOTE — Telephone Encounter (Signed)
Spoke with the patient, he is going to oncology on 2/21 and is getting labs. I advised the patient that we could wait for for those labs to be done, since he is worried about the cost. The patient wanted me to call the daughter to schedule a day since he cannot drive. I attempted to call her, however, she did not have a voicemail set up.

## 2018-12-09 NOTE — Telephone Encounter (Signed)
-----   Message from [name] Margarita, MD sent at 12/08/2018 11:03 PM EST ----- Echo showed declining LVF with new wall motion abnormalities.  Please find out if patient is still having SOB. May need right and left heart cath.  I would first like him to have a chest CT angio with contrast for SOB

## 2018-12-09 NOTE — Telephone Encounter (Signed)
Notes recorded by Sueanne Margarita, MD on 12/09/2018 at 4:40 PM EST Can he get a stat ddimer

## 2018-12-09 NOTE — Telephone Encounter (Signed)
Sueanne Margarita, MD  Coben Godshall, Mary Sella, RN    I would like him set up for right and left heart cath for ongoing SOB.

## 2018-12-09 NOTE — Telephone Encounter (Signed)
Spoke with Dr. Radford Pax she wanted to cancel the CTA and get a D-dimer and set up an appointment for the patient to get a R/L Heart Cath.

## 2018-12-09 NOTE — Telephone Encounter (Signed)
Spoke with the patient's daughter she will bring the patient tomorrow at 12 pm for an appointment with Ermalinda Barrios, PA-C. She had no further questions.

## 2018-12-10 ENCOUNTER — Ambulatory Visit (INDEPENDENT_AMBULATORY_CARE_PROVIDER_SITE_OTHER): Payer: Medicare Other | Admitting: Physician Assistant

## 2018-12-10 ENCOUNTER — Telehealth: Payer: Self-pay | Admitting: Cardiology

## 2018-12-10 ENCOUNTER — Encounter (HOSPITAL_COMMUNITY): Payer: Self-pay | Admitting: *Deleted

## 2018-12-10 ENCOUNTER — Emergency Department (HOSPITAL_COMMUNITY)
Admission: EM | Admit: 2018-12-10 | Discharge: 2018-12-10 | Disposition: A | Discharge status: Home / Self Care | Payer: Medicare Other | Attending: Emergency Medicine | Admitting: Emergency Medicine

## 2018-12-10 ENCOUNTER — Encounter: Payer: Self-pay | Admitting: Physician Assistant

## 2018-12-10 ENCOUNTER — Other Ambulatory Visit: Payer: Self-pay

## 2018-12-10 ENCOUNTER — Emergency Department (HOSPITAL_COMMUNITY): Payer: Medicare Other

## 2018-12-10 ENCOUNTER — Other Ambulatory Visit: Payer: Medicare Other | Admitting: *Deleted

## 2018-12-10 VITALS — BP 130/70 | HR 91 | Ht 68.0 in | Wt 269.0 lb

## 2018-12-10 DIAGNOSIS — E785 Hyperlipidemia, unspecified: Secondary | ICD-10-CM | POA: Diagnosis not present

## 2018-12-10 DIAGNOSIS — I10 Essential (primary) hypertension: Secondary | ICD-10-CM

## 2018-12-10 DIAGNOSIS — I2510 Atherosclerotic heart disease of native coronary artery without angina pectoris: Secondary | ICD-10-CM | POA: Insufficient documentation

## 2018-12-10 DIAGNOSIS — J90 Pleural effusion, not elsewhere classified: Secondary | ICD-10-CM | POA: Diagnosis not present

## 2018-12-10 DIAGNOSIS — E1151 Type 2 diabetes mellitus with diabetic peripheral angiopathy without gangrene: Secondary | ICD-10-CM

## 2018-12-10 DIAGNOSIS — E1122 Type 2 diabetes mellitus with diabetic chronic kidney disease: Secondary | ICD-10-CM | POA: Diagnosis not present

## 2018-12-10 DIAGNOSIS — Z01812 Encounter for preprocedural laboratory examination: Secondary | ICD-10-CM

## 2018-12-10 DIAGNOSIS — N183 Chronic kidney disease, stage 3 (moderate): Secondary | ICD-10-CM | POA: Insufficient documentation

## 2018-12-10 DIAGNOSIS — Z87891 Personal history of nicotine dependence: Secondary | ICD-10-CM | POA: Insufficient documentation

## 2018-12-10 DIAGNOSIS — E1169 Type 2 diabetes mellitus with other specified complication: Secondary | ICD-10-CM

## 2018-12-10 DIAGNOSIS — I130 Hypertensive heart and chronic kidney disease with heart failure and stage 1 through stage 4 chronic kidney disease, or unspecified chronic kidney disease: Secondary | ICD-10-CM | POA: Diagnosis not present

## 2018-12-10 DIAGNOSIS — J449 Chronic obstructive pulmonary disease, unspecified: Secondary | ICD-10-CM

## 2018-12-10 DIAGNOSIS — I255 Ischemic cardiomyopathy: Secondary | ICD-10-CM

## 2018-12-10 DIAGNOSIS — R0602 Shortness of breath: Secondary | ICD-10-CM

## 2018-12-10 DIAGNOSIS — R0609 Other forms of dyspnea: Secondary | ICD-10-CM

## 2018-12-10 DIAGNOSIS — I4510 Unspecified right bundle-branch block: Secondary | ICD-10-CM | POA: Diagnosis not present

## 2018-12-10 DIAGNOSIS — I5032 Chronic diastolic (congestive) heart failure: Secondary | ICD-10-CM | POA: Diagnosis not present

## 2018-12-10 DIAGNOSIS — R7989 Other specified abnormal findings of blood chemistry: Secondary | ICD-10-CM | POA: Insufficient documentation

## 2018-12-10 DIAGNOSIS — I252 Old myocardial infarction: Secondary | ICD-10-CM | POA: Diagnosis not present

## 2018-12-10 LAB — BASIC METABOLIC PANEL
BUN/Creatinine Ratio: 14 (ref 10–24)
BUN: 11 mg/dL (ref 8–27)
CO2: 32 mmol/L — ABNORMAL HIGH (ref 20–29)
Calcium: 9.6 mg/dL (ref 8.6–10.2)
Chloride: 95 mmol/L — ABNORMAL LOW (ref 96–106)
Creatinine, Ser: 0.8 mg/dL (ref 0.76–1.27)
GFR calc non Af Amer: 89 mL/min/{1.73_m2} (ref >59)
GFR, EST AFRICAN AMERICAN: 102 mL/min/{1.73_m2} (ref >59)
Glucose: 222 mg/dL — ABNORMAL HIGH (ref 65–99)
Potassium: 4.3 mmol/L (ref 3.5–5.2)
Sodium: 135 mmol/L (ref 134–144)

## 2018-12-10 LAB — CBC
HEMOGLOBIN: 10.6 g/dL — AB (ref 13.0–17.7)
Hematocrit: 34.1 % — ABNORMAL LOW (ref 37.5–51.0)
MCH: 26.6 pg (ref 26.6–33.0)
MCHC: 31.1 g/dL — ABNORMAL LOW (ref 31.5–35.7)
MCV: 86 fL (ref 79–97)
Platelets: 192 10*3/uL (ref 150–450)
RBC: 3.98 x10E6/uL — ABNORMAL LOW (ref 4.14–5.80)
RDW: 19.4 % — ABNORMAL HIGH (ref 11.6–15.4)
WBC: 6.7 10*3/uL (ref 3.4–10.8)

## 2018-12-10 LAB — I-STAT TROPONIN, ED: Troponin i, poc: 0.02 ng/mL (ref 0.00–0.08)

## 2018-12-10 LAB — SPECIMEN STATUS REPORT

## 2018-12-10 MED ORDER — IOPAMIDOL (ISOVUE-370) INJECTION 76%
INTRAVENOUS | Status: AC
  Filled 2018-12-10: qty 100

## 2018-12-10 MED ORDER — IOPAMIDOL (ISOVUE-370) INJECTION 76%
100.0000 mL | Freq: Once | INTRAVENOUS | Status: AC | PRN
  Administered 2018-12-10: 100 mL via INTRAVENOUS

## 2018-12-10 NOTE — ED Triage Notes (Signed)
Pt reports he was sent here to r/o PE by Dr. Radford Pax.  He had blood drawn today and was found to have + D-dimer.  Denies cp or sob more than usual.  He wears home O2.  He is A&Od 4, in NAD.

## 2018-12-10 NOTE — Patient Instructions (Addendum)
Medication Instructions:  Your physician recommends that you continue on your current medications as directed. Please refer to the Current Medication list given to you today.  If you need a refill on your cardiac medications before your next appointment, please call your pharmacy.   Lab work: TODAY: CBC, BMET  If you have labs (blood work) drawn today and your tests are completely normal, you will receive your results only by: Marland Kitchen MyChart Message (if you have MyChart) OR . A paper copy in the mail If you have any lab test that is abnormal or we need to change your treatment, we will call you to review the results.  Testing/Procedures: None ordered  Follow-Up: At [facility], you and your health needs are our priority.  As part of our continuing mission to provide you with exceptional heart care, we have created designated Provider Care Teams.  These Care Teams include your primary Cardiologist (physician) and Advanced Practice Providers (APPs -  Physician Assistants and Nurse Practitioners) who all work together to provide you with the care you need, when you need it. . You will need a follow up appointment next available.  You may see Fransico Him, MD or one of the following Advanced Practice Providers on your designated Care Team:   . Lyda Jester, PA-C . Dayna Dunn, PA-C . Ermalinda Barrios, PA-C  Any Other Special Instructions Will Be Listed Below (If Applicable).

## 2018-12-10 NOTE — ED Provider Notes (Addendum)
[facility] Emergency Department Provider Note MRN:  [id number]  Arrival date & time: 12/10/18     Chief Complaint   + D-Dimer   History of Present Illness   [name] is a 73 y.o. year-old male with a history of CAD, CHF presenting to the ED with chief complaint of abnormal lab.  Patient was advised to go to the emergency department for evaluation.  Blood test today at doctor's office found a positive d-dimer.  Patient denies any symptoms currently, no new changes to his breathing, no chest pain, no fever, no cough, no leg pain or swelling.  3 weeks ago had a ground-level fall, but no residual pain or injuries.  Review of Systems  A complete 10 system review of systems was obtained and all systems are negative except as noted in the HPI and PMH.   Patient's Health History    Past Medical History:  Diagnosis Date  . Acute upper GI bleed 02/2018   Transfused 4 U pRBCs-->EGD showed an esoph ulcer and some gastric ulcers (?radiation-induced), path showed no malignancy or Barrett's.  Rpt EGD 04/28/18: Grade I and small (< 5 mm) distal esophageal varices.  Esoph ulcer healed.  Mild portal hypert gastrop.  No sign of malignancy.  . Asthma    as a child  . CAD in native artery    a. cardiac cath 09/2013 showed severely diseased mLAD and diagonal, mild LCx disease, moderate RCA disease, LVEDP 20, EF 50%.  . Cataract    multiple types, bilateral  . Cholelithiasis without cholecystitis   . Chronic diastolic CHF (congestive heart failure) ([place]) 02/25/2009   Dr. Radford Pax to get echo and referred pt to pulm as of 11/20/2018 due to worsening SOB and orthopnea (BNP normal).  . Chronic renal insufficiency, stage III (moderate) (HCC) 2017   Stage II/III (GFR around 60)  . 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  CT 11/2017 w/ascites and persistent bilat pleural effusions  . 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 COPD (chronic obstructive pulmonary disease) ([place])   . Coronary artery disease    chronically occluded LAD and diagonal with right to left collaterals, mild disease in the left circ and moderate disease in the mid RCA on medical management with Imdur, ASA, and Plavix.  . Dermatitis 05/2014  . DIABETES MELLITUS, TYPE II 06/24/2007   No diab retpthy as of 08/05/15 eye exam.  . Esophageal cancer ([place]) 04/2016   poorly differentiated carcinoma (Dr. Pyrtle--EGD).  CT C/A/P showed metastatic adenopathy in mediastinum and upper abdomen 05/09/16.  Tx plan is palliative radiation (completed 06/22/16), then palliative systemic chemotherapy (carbo+taxol) was started but as of 08/03/16 onc f/u this was held due to severe knee and ankle arthralgias.  Restarting as of 10/2015 (08/2016 CT showed some disease regression  . Esophageal cancer ([place])    CT 12/2016 showed no residual esoph mass--plan to continue chemo.  Ongoing palliative chemo with carboplatin and taxol q 2 wks as of 03/2017 onc f/u (CT 12/2016 and 04/2017 showed no progression of dz).  Taking a 2 mo break from chemo as of 05/2017.  Pleural effusion-- improved with lasix but onc wanted him to get dx/therap thoracentesis-pt declined.  CT C/A/P no progression/recurr/mets 11/2017.  Marland Kitchen Esophageal cancer ([place])    06/16/18 surveillance CTs showed no evidence of dz recurrence.  . Essential hypertension 05/01/2007   Qualifier: Diagnosis of  By: Tiney Rouge CMA, [place]     . History of kidney stones   . Hyperkalemia 12/2015   Decreased ACE-I by 50% in response, then potassium normalized.  Marland Kitchen HYPERLIPIDEMIA 06/24/2007   03/2018 lipids at goal  . Hypoxemia 01/27/2010  . Morbid obesity ([place])   . Myocardial infarction [facility])    pt states he was informed per MD that he has had one but pt was unaware   . Obesity hypoventilation syndrome (HCC)    oxygen 24/7 (2 liters [place] as of 02/2016)  . On home oxygen therapy    Oxygen @ 2l/m nasally 24/7 hours  . OSA (obstructive  sleep apnea)    not tested; pt scored 4 per stop bang tool results sent to PCP   . OSTEOARTHRITIS 05/01/2007  . Pancytopenia due to chemotherapy ([place]) 2018  . Pleural effusion on right 08/2017   ? malignant vs CHF: pt refuses diagnostic thoracentesis b/c he states he feels fine, wants to try diuretic first.  . Pleural effusion on right 09/2017   Thoracentesis  . Presbycusis of both ears 05/2015   [facility]  . Pruritic condition 05/2014   Allergist summer 2015, no new testing.  Marland Kitchen RBBB   . Visual field defect 05/10/2016   Per Dr. Melina Fiddler, O.D.: Rt, loss of inf/temp quad and some loss sup/temp.  ?CVA  ? Pituitary tumor? 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    Past Surgical History:  Procedure Laterality Date  . APPENDECTOMY    . BALLOON DILATION N/A 05/04/2016   Procedure: BALLOON DILATION;  Surgeon: Jerene Bears, MD;  Location: [facility] ENDOS[facility];  Service: Gastroenterology;  Laterality: N/A;  . BIOPSY  03/19/2018   Procedure: BIOPSY;  Surgeon: Jackquline Denmark, MD;  Location: [facility] ENDOS[facility];  Service: Endoscopy;;  . CARDIAC CATHETERIZATION    . CARPAL TUNNEL RELEASE Left   . CATARACT EXTRACTION W/PHACO Right 04/29/2013   Procedure: CATARACT EXTRACTION PHACO AND INTRAOCULAR LENS PLACEMENT (IOC);  Surgeon: Adonis Brook, MD;  Location: [facility];  Service: Ophthalmology;  Laterality: Right;  . CATARACT EXTRACTION, BILATERAL    . COLONOSCOPY W/ POLYPECTOMY  08/2011   Many polyps--all hyperplastic, severe diverticulosis, int hem.  [employer] hemoccult testing via [facility] 06/22/15 NEG  . ESOPHAGOGASTRODUODENOSCOPY N/A 05/04/2016   Procedure: ESOPHAGOGASTRODUODENOSCOPY (EGD);  Surgeon: Jerene Bears, MD;  Location: [facility];  Service: Gastroenterology;  Laterality: N/A;  . ESOPHAGOGASTRODUODENOSCOPY (EGD) WITH PROPOFOL N/A 03/19/2018   Gastric ulcers, esoph ulcer (radiation-induced?), bx-->path neg for malignancy or Barrett's.  GI plans repeat EGD 6-8 wks after 1st.   Procedure:  ESOPHAGOGASTRODUODENOSCOPY (EGD) WITH PROPOFOL;  Surgeon: Jackquline Denmark, MD;  Location: [facility];  Service: Endoscopy;  Laterality: N/A;  . ESOPHAGOGASTRODUODENOSCOPY (EGD) WITH PROPOFOL N/A 04/28/2018   Esoph ulcer healed.  Grade I and small (< 5 mm) distal esophageal varices.  Mild portal hypertensive gastropathy.  No evidence of malignance.  Procedure: ESOPHAGOGASTRODUODENOSCOPY (EGD) WITH PROPOFOL;  Surgeon: Jerene Bears, MD;  Location: [facility];  Service: Gastroenterology;  Laterality: N/A;  . IR GENERIC HISTORICAL  10/03/2016   IR US GUIDE VASC ACCESS RIGHT 10/03/2016 Greggory Keen, MD [facility]  . IR GENERIC HISTORICAL  10/03/2016   IR FLUORO GUIDE PORT INSERTION RIGHT 10/03/2016 Greggory Keen, MD [facility]  . KNEE ARTHROSCOPY WITH MEDIAL MENISECTOMY Right 12/16/2014   Procedure: RIGHT KNEE ARTHROSCOPY WITH MEDIAL MENISECTOMY microfracture medial femoral condyle abrasion condroplasty medial femoral condyle lateral menisectomy;  Surgeon: Kipp Brood  Gioffre, MD;  Location: [facility];  Service: Orthopedics;  Laterality: Right;  . LEFT HEART CATHETERIZATION WITH CORONARY ANGIOGRAM N/A 10/26/2013   Procedure: LEFT HEART CATHETERIZATION WITH CORONARY ANGIOGRAM;  Surgeon: Jettie Booze, MD;  Location: [facility] CATH LAB;  Service: Cardiovascular;  Laterality: N/A;  . LUMBAR [address] SURGERY  2001  . SHOULDER SURGERY Right    right fx  . TONSILLECTOMY    . TRANSTHORACIC ECHOCARDIOGRAM  03/2016; 10/2018   2017: EF 55-60%, grade I DD, LAE.  Jan 2020->new anteroapical akinesis, EF 40-45%-->may need R and L HC, but cardiologist recommends CT angio chest first.  . WRIST SURGERY Right    right fx    Family History  Problem Relation Age of Onset  . Heart attack Mother   . Aneurysm Father   . Alcohol abuse Father   . Diabetes Maternal Aunt   . Colon cancer Neg Hx     Social History   Socioeconomic History  . Marital status: Married    Spouse name: susan  . Number of children: 2  . Years of  education: Not on file  . Highest education level: Not on file  Occupational History  . Occupation: Retired  Scientific laboratory technician  . Financial resource strain: Not on file  . Food insecurity:    Worry: Not on file    Inability: Not on file  . Transportation needs:    Medical: Not on file    Non-medical: Not on file  Tobacco Use  . Smoking status: Former Smoker    Packs/day: 1.50    Years: 30.00    Pack years: 45.00    Types: Cigarettes, Pipe, Cigars    Last attempt to quit: 10/30/1983    Years since quitting: 35.1  . Smokeless tobacco: Current User    Types: Chew    Last attempt to quit: 05/15/2016  Substance and Sexual Activity  . Alcohol use: No  . Drug use: No  . Sexual activity: Not Currently  Lifestyle  . Physical activity:    Days per week: Not on file    Minutes per session: Not on file  . Stress: Not on file  Relationships  . Social connections:    Talks on phone: Not on file    Gets together: Not on file    Attends religious service: Not on file    Active member of club or organization: Not on file    Attends meetings of clubs or organizations: Not on file    Relationship status: Not on file  . Intimate partner violence:    Fear of current or ex partner: Not on file    Emotionally abused: Not on file    Physically abused: Not on file    Forced sexual activity: Not on file  Other Topics Concern  . Not on file  Social History Narrative   Married, one son and one daughter.   His daughter and her two children live with him.   Coffee daily.  Former smoker.  No alcohol.   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   Attends church weekly-   Oxygen continuous     Physical Exam  Vital Signs and Nursing Notes reviewed Vitals:   12/10/18 1716  BP: 129/71  Pulse: 82  Resp: 18  Temp: 98.1 F (36.7 C)  SpO2: 95%    CONSTITUTIONAL: Chronically ill-appearing, NAD NEURO:  Alert and oriented x 3, no focal deficits EYES:  eyes equal and  reactive ENT/NECK:  no LAD, no JVD CARDIO: Regular rate, well-perfused, normal S1 and S2 PULM:  CTAB no wheezing or rhonchi GI/GU:  normal bowel sounds, non-distended, non-tender MSK/SPINE:  No gross deformities, no edema SKIN:  no rash, atraumatic PSYCH:  Appropriate speech and behavior  Diagnostic and Interventional Summary    EKG Interpretation  Date/Time:  Wednesday December 10 2018 17:13:46 EST Ventricular Rate:  78 PR Interval:  160 QRS Duration: 128 QT Interval:  392 QTC Calculation: 446 R Axis:   -20 Text Interpretation:  Sinus rhythm with occasional Premature ventricular complexes Right bundle branch block Possible Anterolateral infarct , age undetermined Abnormal ECG Confirmed by Gerlene Fee [phone]) on 12/10/2018 5:50:38 PM      Labs Reviewed  I-STAT TROPONIN, ED    CT ANGIO CHEST PE W OR WO CONTRAST  Final Result      Medications  iopamidol (ISOVUE-370) 76 % injection (has no administration in time range)  iopamidol (ISOVUE-370) 76 % injection 100 mL (100 mLs Intravenous Contrast Given 12/10/18 1923)     Procedures  Emergency Ultrasound Study:   Angiocath insertion Performed by: Maudie Flakes  Consent: Verbal consent obtained. Risks and benefits: risks, benefits and alternatives were discussed Immediately prior to procedure the correct patient, procedure, equipment, support staff and site/side marked as needed.  Indication: difficult IV access Preparation: Patient was prepped and draped in the usual sterile fashion. Vein Location: The left brachial vein was visualized during assessment for potential access sites and was found to be patent/ easily compressed with linear ultrasound.  The needle was visualized with real-time ultrasound and guided into the vein. Gauge: 20  Images were not saved due to the time sensitive nature of the situation. Normal blood return.  Patient tolerance: Patient tolerated the procedure well with no immediate  complications.  Critical Care  ED Course and Medical Decision Making  I have reviewed the triage vital signs and the nursing notes.  Pertinent labs & imaging results that were available during my care of the patient were reviewed by me and considered in my medical decision making (see below for details).  Unclear reasoning behind ordering a d-dimer in the office today.  Vital signs stable, asymptomatic, no evidence of DVT, lungs clear.  However d-dimer is markedly elevated at 6.  Has had a ground-level fall within the past month, but mild trauma separated by weeks would be unlikely to cause this.  Will evaluate with CTA of the chest.  CTA with no evidence of pulmonary embolism.  Does demonstrate pleural effusions, which are known to the patient.  These effusions are not causing patient any symptoms, continues to be asymptomatic, requesting discharge.  We will follow-up with his regular doctors.  After the discussed management above, the patient was determined to be safe for discharge.  The patient was in agreement with this plan and all questions regarding their care were answered.  ED return precautions were discussed and the patient will return to the ED with any significant worsening of condition.  Barth Kirks. [place]onia Small, [place] [email or web address]  Final Clinical Impressions(s) / ED Diagnoses     ICD-10-CM   1. Elevated d-dimer R79.89   2. Pleural effusion J90     ED Discharge Orders    None         Maudie Flakes, MD 12/10/18 2016    Maudie Flakes, MD 12/10/18 2030

## 2018-12-10 NOTE — Discharge Instructions (Addendum)
You were evaluated in the Emergency Department and after careful evaluation, we did not find any emergent condition requiring admission or further testing in the hospital.  Your CT scan did not show any emergencies or blot clots.  Please return to the Emergency Department if you experience any worsening of your condition.  We encourage you to follow up with a primary care provider.  Thank you for allowing [place] to be a part of your care.

## 2018-12-10 NOTE — Telephone Encounter (Signed)
New Message         Patient's daughter is calling to let us know that the patient did go to the ER and she wanted [place] to know.

## 2018-12-10 NOTE — H&P (View-Only) (Signed)
Cardiology Office Note    Date:  12/10/2018   ID:  [name], DOB [birth year], MRN [id number]  PCP:  Tammi Sou, MD  Cardiologist: Fransico Him, MD EPS: None  Chief Complaint  Patient presents with  . Follow-up    History of Present Illness:  [name] is a 73 y.o. male with history of CAD cath in 2014 with severely diseased and chronically occluded LAD and diagonal with right to left collaterals, mild disease in the left circumflex and moderate disease in the mid RCA treated medically with Plavix and Imdur, hypertension, dyslipidemia, morbid obesity with chronic lower extremity edema and obesity hypoventilation syndrome on home O2.  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  Patient saw Dr. Radford Pax 11/20/2018.  He was off aspirin due to GE junction cancer and she was going to ask his oncologist if he could restart aspirin.  He was euvolemic that day but continued to complain of shortness of breath with very little activity.  Referred to pulmonary.  BNP was 90.  2D echo showed a decline in heart function EF 40 to 45% with severe hypokinesis of the apical, anterior septal, anterior and apical left ventricular segments.  Dr. Radford Pax recommended a stat d-dimer and scheduled for right and left cardiac catheterization.  Patient comes in today accompanied by his granddaughter.  He is very upset.  He thought he was just coming in for lab and did not realize he had an appointment to be scheduled for right and left heart catheterization.  He denies chest pain.  He says his shortness of breath has not changed in many years.  It is not any worse than it had been.  I explained the 2D echo and new LV dysfunction and wall motion abnormality and Dr. Theodosia Blender recommendations in detail.  The patient says he is 49, over the hospital more money than he will ever be able to pay back, and cannot afford to have a heart catheterization.  His granddaughter is on the phone with the  patient's daughter who is power of attorney and she wants a cardiac catheterization done.  At this point the patient is of sound mind and is refusing heart catheterization.    Past Medical History:  Diagnosis Date  . Acute upper GI bleed 02/2018   Transfused 4 U pRBCs-->EGD showed an esoph ulcer and some gastric ulcers (?radiation-induced), path showed no malignancy or Barrett's.  Rpt EGD 04/28/18: Grade I and small (< 5 mm) distal esophageal varices.  Esoph ulcer healed.  Mild portal hypert gastrop.  No sign of malignancy.  . Asthma    as a child  . CAD in native artery    a. cardiac cath 09/2013 showed severely diseased mLAD and diagonal, mild LCx disease, moderate RCA disease, LVEDP 20, EF 50%.  . Cataract    multiple types, bilateral  . Cholelithiasis without cholecystitis   . Chronic diastolic CHF (congestive heart failure) ([place]) 02/25/2009   Dr. Radford Pax to get echo and referred pt to pulm as of 11/20/2018 due to worsening SOB and orthopnea (BNP normal).  . Chronic renal insufficiency, stage III (moderate) (HCC) 2017   Stage II/III (GFR around 60)  . 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  CT 11/2017 w/ascites and persistent bilat pleural effusions  . 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 COPD (chronic obstructive pulmonary disease) ([place])   . Coronary  artery disease    chronically occluded LAD and diagonal with right to left collaterals, mild disease in the left circ and moderate disease in the mid RCA on medical management with Imdur, ASA, and Plavix.  . Dermatitis 05/2014  . DIABETES MELLITUS, TYPE II 06/24/2007   No diab retpthy as of 08/05/15 eye exam.  . 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 Pyrtle--EGD).  CT C/A/P showed metastatic adenopathy in mediastinum and upper abdomen 05/09/16.  Tx plan is palliative radiation (completed 06/22/16), then palliative systemic  chemotherapy (carbo+taxol) was started but as of 08/03/16 onc f/u this was held due to severe knee and ankle arthralgias.  Restarting as of 10/2015 (08/2016 CT showed some disease regression  . Esophageal cancer ([place])    CT 12/2016 showed no residual esoph mass--plan to continue chemo.  Ongoing palliative chemo with carboplatin and taxol q 2 wks as of 03/2017 onc f/u (CT 12/2016 and 04/2017 showed no progression of dz).  Taking a 2 mo break from chemo as of 05/2017.  Pleural effusion-- improved with lasix but onc wanted him to get dx/therap thoracentesis-pt declined.  CT C/A/P no progression/recurr/mets 11/2017.  Marland Kitchen Esophageal cancer ([place])    06/16/18 surveillance CTs showed no evidence of dz recurrence.  . Essential hypertension 05/01/2007   Qualifier: Diagnosis of  By: Tiney Rouge CMA, Ellison Hughs     . History of kidney stones   . Hyperkalemia 12/2015   Decreased ACE-I by 50% in response, then potassium normalized.  Marland Kitchen HYPERLIPIDEMIA 06/24/2007   03/2018 lipids at goal  . Hypoxemia 01/27/2010  . Morbid obesity ([place])   . Myocardial infarction [facility])    pt states he was informed per MD that he has had one but pt was unaware   . Obesity hypoventilation syndrome (HCC)    oxygen 24/7 (2 liters [place] as of 02/2016)  . On home oxygen therapy    Oxygen @ 2l/m nasally 24/7 hours  . OSA (obstructive sleep apnea)    not tested; pt scored 4 per stop bang tool results sent to PCP   . OSTEOARTHRITIS 05/01/2007  . Pancytopenia due to chemotherapy ([place]) 2018  . Pleural effusion on right 08/2017   ? malignant vs CHF: pt refuses diagnostic thoracentesis b/c he states he feels fine, wants to try diuretic first.  . Pleural effusion on right 09/2017   Thoracentesis  . Presbycusis of both ears 05/2015   [facility]  . Pruritic condition 05/2014   Allergist summer 2015, no new testing.  Marland Kitchen RBBB   . Visual field defect 05/10/2016   Per Dr. Melina Fiddler, O.D.: Rt, loss of inf/temp quad and some loss sup/temp.  ?CVA  ? Pituitary tumor? ?Brain  met.  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    Past Surgical History:  Procedure Laterality Date  . APPENDECTOMY    . BALLOON DILATION N/A 05/04/2016   Procedure: BALLOON DILATION;  Surgeon: Jerene Bears, MD;  Location: [facility];  Service: Gastroenterology;  Laterality: N/A;  . BIOPSY  03/19/2018   Procedure: BIOPSY;  Surgeon: Jackquline [place], MD;  Location: [facility];  Service: Endoscopy;;  . CARDIAC CATHETERIZATION    . CARPAL TUNNEL RELEASE Left   . CATARACT EXTRACTION W/PHACO Right 04/29/2013   Procedure: CATARACT EXTRACTION PHACO AND INTRAOCULAR LENS PLACEMENT (IOC);  Surgeon: Adonis Brook, MD;  Location: [facility];  Service: Ophthalmology;  Laterality: Right;  . CATARACT EXTRACTION, BILATERAL    . COLONOSCOPY W/ POLYPECTOMY  08/2011  Many polyps--all hyperplastic, severe diverticulosis, int hem.  [employer] hemoccult testing via [facility] 06/22/15 NEG  . ESOPHAGOGASTRODUODENOSCOPY N/A 05/04/2016   Procedure: ESOPHAGOGASTRODUODENOSCOPY (EGD);  Surgeon: Jerene Bears, MD;  Location: [facility];  Service: Gastroenterology;  Laterality: N/A;  . ESOPHAGOGASTRODUODENOSCOPY (EGD) WITH PROPOFOL N/A 03/19/2018   Gastric ulcers, esoph ulcer (radiation-induced?), bx-->path neg for malignancy or Barrett's.  GI plans repeat EGD 6-8 wks after 1st.   Procedure: ESOPHAGOGASTRODUODENOSCOPY (EGD) WITH PROPOFOL;  Surgeon: Jackquline [place], MD;  [facility];  Service: Endoscopy;  Laterality: N/A;  . ESOPHAGOGASTRODUODENOSCOPY (EGD) WITH PROPOFOL N/A 04/28/2018   Esoph ulcer healed.  Grade I and small (< 5 mm) distal esophageal varices.  Mild portal hypertensive gastropathy.  No evidence of malignance.  Procedure: ESOPHAGOGASTRODUODENOSCOPY (EGD) WITH PROPOFOL;  Surgeon: Jerene Bears, MD;  [facility];  Service: Gastroenterology;  Laterality: N/A;  . IR GENERIC HISTORICAL  10/03/2016   IR US GUIDE VASC ACCESS RIGHT 10/03/2016 Greggory Keen, MD [facility]  . IR GENERIC  HISTORICAL  10/03/2016   IR FLUORO GUIDE PORT INSERTION RIGHT 10/03/2016 Greggory Keen, MD [facility]  . KNEE ARTHROSCOPY WITH MEDIAL MENISECTOMY Right 12/16/2014   Procedure: RIGHT KNEE ARTHROSCOPY WITH MEDIAL MENISECTOMY microfracture medial femoral condyle abrasion condroplasty medial femoral condyle lateral menisectomy;  Surgeon: Tobi Bastos, MD;  [facility];  Service: Orthopedics;  Laterality: Right;  . LEFT HEART CATHETERIZATION WITH CORONARY ANGIOGRAM N/A 10/26/2013   Procedure: LEFT HEART CATHETERIZATION WITH CORONARY ANGIOGRAM;  Surgeon: Jettie Booze, MD;  [facility] LAB;  Service: Cardiovascular;  Laterality: N/A;  . LUMBAR [place] SURGERY  2001  . SHOULDER SURGERY Right    right fx  . TONSILLECTOMY    . TRANSTHORACIC ECHOCARDIOGRAM  03/2016; 10/2018   2017: EF 55-60%, grade I DD, LAE.  Jan 2020->new anteroapical akinesis, EF 40-45%-->may need R and L HC, but cardiologist recommends CT angio chest first.  . WRIST SURGERY Right    right fx    Current Medications: Current Meds  Medication Sig  . atorvastatin (LIPITOR) 80 MG tablet TAKE 1 TABLET BY MOUTH ONCE [name]  . baclofen (LIORESAL) 10 MG tablet Take 10 mg by mouth every 8 (eight) hours.  . beta carotene w/minerals (OCUVITE) tablet Take 1 tablet by mouth [name].  . cetirizine (ZYRTEC) 10 MG tablet Take 10 mg by mouth every evening.   . citalopram (CELEXA) 20 MG tablet TAKE 1 TABLET BY MOUTH ONCE [name]  . clonazePAM (KLONOPIN) 1 MG tablet Take 1 tablet (1 mg total) by mouth 2 (two) times [name] as needed. for anxiety  . clotrimazole-betamethasone (LOTRISONE) cream APPLY  CREAM TOPICALLY TO AFFECTED AREA TWICE [name]  . ezetimibe (ZETIA) 10 MG tablet Take 10 mg by mouth at bedtime.  . furosemide (LASIX) 20 MG tablet TAKE 1 TABLET BY MOUTH ONCE [name]  . gabapentin (NEURONTIN) 300 MG capsule TAKE 1 CAPSULE BY MOUTH THREE TIMES [name]  . HUMULIN N KWIKPEN 100 UNIT/ML Kiwkpen INJECT 16 UNITS SUBCUTANEOUSLY IN THE  MORNING AND 16 IN THE EVENING  . hydrOXYzine (ATARAX/VISTARIL) 25 MG tablet TAKE ONE TABLET BY MOUTH AT SUPPER AND 1 TABLET AT BEDTIME FOR INSOMNIA  . Insulin Lispro (HUMALOG KWIKPEN) 200 UNIT/ML SOPN Inject 20 Units into the skin 2 (two) times [name].  . magnesium chloride (SLOW-MAG) 64 MG TBEC SR tablet Take 1 tablet (64 mg total) by mouth [name].  . metFORMIN (GLUCOPHAGE) 1000 MG tablet TAKE 1 TABLET BY MOUTH TWICE [name] WITH MEALS  .  Omega-3 Fatty Acids (FISH OIL) 1200 MG CAPS Take 2,400 mg by mouth 2 (two) times [name].  . ONE TOUCH ULTRA TEST test strip USE 1 STRIP TO CHECK GLUCOSE THREE TIMES [name] AS DIRECTED  . ONETOUCH DELICA LANCETS 50P MISC USE   TO CHECK GLUCOSE THREE TIMES [name]  . OXYGEN Inhale 2 L into the lungs continuous.   . pantoprazole (PROTONIX) 40 MG tablet TAKE 1 TABLET BY MOUTH TWICE [name]  . [DISCONTINUED] ezetimibe (ZETIA) 10 MG tablet TAKE 1 TABLET BY MOUTH ONCE [name] (Patient taking differently: TAKE 1 TABLET BY MOUTH ONCE [name] AT BEDTIME)     Allergies:   Patient has no known allergies.   Social History   Socioeconomic History  . Marital status: Married    Spouse name: susan  . Number of children: 2  . Years of education: Not on file  . Highest education level: Not on file  Occupational History  . Occupation: Retired  Scientific laboratory technician  . Financial resource strain: Not on file  . Food insecurity:    Worry: Not on file    Inability: Not on file  . Transportation needs:    Medical: Not on file    Non-medical: Not on file  Tobacco Use  . Smoking status: Former Smoker    Packs/day: 1.50    Years: 30.00    Pack years: 45.00    Types: Cigarettes, Pipe, Cigars    Last attempt to quit: 10/30/1983    Years since quitting: 35.1  . Smokeless tobacco: Current User    Types: Chew    Last attempt to quit: 05/15/2016  Substance and Sexual Activity  . Alcohol use: No  . Drug use: No  . Sexual activity: Not Currently  Lifestyle  . Physical activity:    Days per  week: Not on file    Minutes per session: Not on file  . Stress: Not on file  Relationships  . Social connections:    Talks on phone: Not on file    Gets together: Not on file    Attends religious service: Not on file    Active member of club or organization: Not on file    Attends meetings of clubs or organizations: Not on file    Relationship status: Not on file  Other Topics Concern  . Not on file  Social History Narrative   Married, one son and one daughter.   His daughter and her two children live with him.   Coffee [name].  Former smoker.  No alcohol.   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   Attends church weekly-   Oxygen continuous     Family History:  The patient's family history includes Alcohol abuse in his father; Aneurysm in his father; Diabetes in his maternal aunt; Heart attack in his mother.   ROS:   Please see the history of present illness.    Review of Systems  Constitution: Negative.  HENT: Negative.   Cardiovascular: Positive for dyspnea on exertion.  Respiratory: Negative.   Endocrine: Negative.   Hematologic/Lymphatic: Negative.   Musculoskeletal: Negative.   Gastrointestinal: Negative.   Genitourinary: Negative.   Neurological: Negative.    All other systems reviewed and are negative.   PHYSICAL EXAM:   VS:  BP 130/70 (BP Location: Left Arm, Patient Position: Sitting, Cuff Size: Normal)   Pulse 91   Ht '5\' 8"'  (1.727 m)   Wt 269 lb (122 kg)   SpO2 Marland Kitchen)  89% Comment: on 2L of 02  BMI 40.90 kg/m   Physical Exam  GEN: Well nourished, well developed, in no acute distress  HEENT: normal  Neck: no JVD, carotid bruits, or masses Cardiac:RRR; no murmurs, rubs, or gallops  Respiratory:  clear to auscultation bilaterally, normal work of breathing GI: soft, nontender, nondistended, + BS Ext: without cyanosis, clubbing, or edema, Good distal pulses bilaterally MS: no deformity or atrophy  Skin: warm and dry, no  rash Neuro:  Alert and Oriented x 3 Psych: euthymic mood, full affect  Wt Readings from Last 3 Encounters:  12/10/18 269 lb (122 kg)  11/20/18 273 lb (123.8 kg)  10/24/18 277 lb 8 oz (125.9 kg)      Studies/Labs Reviewed:   EKG:  EKG is not ordered today.  Recent Labs: 09/18/2018: ALT 23; BUN 14; Creatinine 0.96; Hemoglobin 10.0; Platelets 147; Potassium 4.0; Sodium 138 11/20/2018: NT-Pro BNP 90   Lipid Panel    Component Value Date/Time   CHOL 97 (L) 04/24/2018 0951   TRIG 144 04/24/2018 0951   TRIG 268 (HH) 09/06/2006 1555   HDL 36 (L) 04/24/2018 0951   CHOLHDL 2.7 04/24/2018 0951   CHOLHDL 3.6 03/12/2016 1001   VLDL 37 (H) 03/12/2016 1001   LDLCALC 32 04/24/2018 0951   LDLDIRECT 74.0 04/15/2015 0838    Additional studies/ records that were reviewed today include:  2Decho 11/25/18 IMPRESSIONS      1. The left ventricle has mild-moderately reduced systolic function of 38-10%. The cavity size is normal. There is no increased left ventricular wall thickness. Echo evidence of impaired diastolic relaxation. Normal left ventricular filling pressures.  2. There is severe hypokinesis of the apical anteroseptal, anterior and apical left ventricular segments.  3. The right ventricle has normal systolic function. The cavity in normal in size. There is no increase in right ventricular wall thickness. Right ventricular systolic pressure could not be assessed.  4. Mildly dilated left atrial size.  5. Mild mitral annular calcification.  6. Pulmonic valve regurgitation is mild.  7. The aortic root and ascending aorta are normal in size and structure.  8. No intracardiac thrombi or masses were visualized.  9. Compared to 2017, there is a new anteroapical wall motion abnormality consistent with interval infarction in the mid-distal LAD territory.   Cardiac catheterization 09/2013 ANGIOGRAPHIC DATA:   The left main coronary artery is patent.   The left anterior descending artery is a  large vessel.  There are several areas of complete and subtotal occlusion in the mid vessel.  Competitive flow noted in the distal LAD.  Medium sized diagonal with a 70% stenosis.   The left circumflex artery is a large vessel with mild irregularities.  There is a large OM1 which is widely patent.     The right coronary artery is a large dominant vessel with a 60-70% mid vessel lesion in some views. There is a medium sized PDA and PLA which have mild disease.   LEFT VENTRICULOGRAM:  Left ventricular angiogram was done in the 30 RAO projection and revealed anterior hypokinesis with low normal systolic function with an estimated ejection fraction of 50%.  LVEDP 20 was mmHg.   IMPRESSIONS:   1. Normal left main coronary artery. 2. 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 3. Mild disease in the  left circumflex artery and its branches. 4. Moderate disease in the mid right coronary artery. 5. Low normal left ventricular systolic function.  LVEDP 20 mmHg.  Ejection  fraction 50%.   RECOMMENDATION:  Increase medical therapy.  Start Imdur and Plavix.  If sx continue, would consider PCI of CTO of the LAD.        Electronically signed by Jettie Booze, MD at 10/26/2013 10:07 AM      ASSESSMENT:    1. Dyspnea on exertion   2. Ischemic cardiomyopathy   3. Atherosclerosis of native coronary artery of native heart without angina pectoris   4. Chronic diastolic CHF (congestive heart failure) ([place])   5. Essential hypertension   6. DM (diabetes mellitus) type II, controlled, with peripheral vascular disorder ([place])   7. Chronic obstructive pulmonary disease, unspecified COPD type ([place])   8. Dyslipidemia associated with type 2 diabetes mellitus ([place])   9. Pre-procedure lab exam      PLAN:  In order of problems listed above:  Dyspnea on exertion  He is on chronic O2 for obesity hypoventilation syndrome.  And to have new LV dysfunction EF 40 to 45% on echo and  Dr. Radford Pax recommends left and right heart catheterization for further evaluation.  There was miscommunication and he did not realize he was being seen today for right and left heart catheterization he ended he is currently refusing to have it done.  His daughter who is not here today wants him to proceed with it.  We will draw labs and he will call us back if he changes his mind. I have reviewed the risks, indications, and alternatives to angioplasty and stenting with the patient. Risks include but are not limited to bleeding, infection, vascular injury, stroke, myocardial infection, arrhythmia, kidney injury, radiation-related injury in the case of prolonged fluoroscopy use, emergency cardiac surgery, and death. The patient understands the risks of serious complication is low (<1%) and patient agrees to proceed.     Ischemic cardiomyopathy EF now 40 to 45% which is new wall motion abnormality.  Cardiac catheterization recommended by Dr. Radford Pax.  CAD cardiac cath in 2014 with CTO of the LAD moderate disease in the RCA EF 50% at that time.  Denies angina.  Has been off aspirin because of GE junction adenocarcinoma.  Chronic diastolic CHF compensated  Essential hypertension controlled  Diabetes mellitus managed by PCP  COPD on home O2  Dyslipidemia on Lipitor 80 mg [name]  Preprocedure labs will check labs for cardiac catheterization in case patient changes his mind.     Medication Adjustments/Labs and Tests Ordered: Current medicines are reviewed at length with the patient today.  Concerns regarding medicines are outlined above.  Medication changes, Labs and Tests ordered today are listed in the Patient Instructions below. Patient Instructions  Medication Instructions:  Your physician recommends that you continue on your current medications as directed. Please refer to the Current Medication list given to you today.  If you need a refill on your cardiac medications before your next  appointment, please call your pharmacy.   Lab work: TODAY: CBC, BMET  If you have labs (blood work) drawn today and your tests are completely normal, you will receive your results only by: Marland Kitchen MyChart Message (if you have MyChart) OR . A paper copy in the mail If you have any lab test that is abnormal or we need to change your treatment, we will call you to review the results.  Testing/Procedures: None ordered  Follow-Up: At [facility], you and your health needs are our priority.  As part of our continuing mission to provide you with exceptional heart care, we have created designated  Provider Care Teams.  These Care Teams include your primary Cardiologist (physician) and Advanced Practice Providers (APPs -  Physician Assistants and Nurse Practitioners) who all work together to provide you with the care you need, when you need it. . You will need a follow up appointment next available.  You may see Fransico Him, MD or one of the following Advanced Practice Providers on your designated Care Team:   . Lyda Jester, PA-C . Dayna Dunn, PA-C . Ermalinda Barrios, PA-C  Any Other Special Instructions Will Be Listed Below (If Applicable).       Sumner Boast, PA-C  12/10/2018 12:31 PM    [facility] [place], [place], [place]  [address] Phone: [phone]; Fax: [phone]

## 2018-12-10 NOTE — ED Notes (Signed)
Patient verbalizes understanding of discharge instructions. Opportunity for questioning and answers were provided. Armband removed by staff, pt discharged from ED.  

## 2018-12-10 NOTE — ED Notes (Signed)
Patient transported to CT 

## 2018-12-10 NOTE — Progress Notes (Signed)
Cardiology Office Note    Date:  12/10/2018   ID:  [name], DOB 05/18/1946, MRN [id number]  PCP:  Tammi Sou, MD  Cardiologist: Fransico Him, MD EPS: None  Chief Complaint  Patient presents with  . Follow-up    History of Present Illness:  [name] is a 73 y.o. male with history of CAD cath in 2014 with severely diseased and chronically occluded LAD and diagonal with right to left collaterals, mild disease in the left circumflex and moderate disease in the mid RCA treated medically with Plavix and Imdur, hypertension, dyslipidemia, morbid obesity with chronic lower extremity edema and obesity hypoventilation syndrome on home O2.  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  Patient saw Dr. Radford Pax 11/20/2018.  He was off aspirin due to GE junction cancer and she was going to ask his oncologist if he could restart aspirin.  He was euvolemic that day but continued to complain of shortness of breath with very little activity.  Referred to pulmonary.  BNP was 90.  2D echo showed a decline in heart function EF 40 to 45% with severe hypokinesis of the apical, anterior septal, anterior and apical left ventricular segments.  Dr. Radford Pax recommended a stat d-dimer and scheduled for right and left cardiac catheterization.  Patient comes in today accompanied by his granddaughter.  He is very upset.  He thought he was just coming in for lab and did not realize he had an appointment to be scheduled for right and left heart catheterization.  He denies chest pain.  He says his shortness of breath has not changed in many years.  It is not any worse than it had been.  I explained the 2D echo and new LV dysfunction and wall motion abnormality and Dr. Theodosia Blender recommendations in detail.  The patient says he is 78, over the hospital more money than he will ever be able to pay back, and cannot afford to have a heart catheterization.  His granddaughter is on the phone with the  patient's daughter who is power of attorney and she wants a cardiac catheterization done.  At this point the patient is of sound mind and is refusing heart catheterization.    Past Medical History:  Diagnosis Date  . Acute upper GI bleed 02/2018   Transfused 4 U pRBCs-->EGD showed an esoph ulcer and some gastric ulcers (?radiation-induced), path showed no malignancy or Barrett's.  Rpt EGD 04/28/18: Grade I and small (< 5 mm) distal esophageal varices.  Esoph ulcer healed.  Mild portal hypert gastrop.  No sign of malignancy.  . Asthma    as a child  . CAD in native artery    a. cardiac cath 09/2013 showed severely diseased mLAD and diagonal, mild LCx disease, moderate RCA disease, LVEDP 20, EF 50%.  . Cataract    multiple types, bilateral  . Cholelithiasis without cholecystitis   . Chronic diastolic CHF (congestive heart failure) ([place]) 02/25/2009   Dr. Radford Pax to get echo and referred pt to pulm as of 11/20/2018 due to worsening SOB and orthopnea (BNP normal).  . Chronic renal insufficiency, stage III (moderate) (HCC) 2017   Stage II/III (GFR around 60)  . 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  CT 11/2017 w/ascites and persistent bilat pleural effusions  . 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 COPD (chronic obstructive pulmonary disease) ([place])   . Coronary  artery disease    chronically occluded LAD and diagonal with right to left collaterals, mild disease in the left circ and moderate disease in the mid RCA on medical management with Imdur, ASA, and Plavix.  . Dermatitis 05/2014  . DIABETES MELLITUS, TYPE II 06/24/2007   No diab retpthy as of 08/05/15 eye exam.  . 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 Pyrtle--EGD).  CT C/A/P showed metastatic adenopathy in mediastinum and upper abdomen 05/09/16.  Tx plan is palliative radiation (completed 06/22/16), then palliative systemic  chemotherapy (carbo+taxol) was started but as of 08/03/16 onc f/u this was held due to severe knee and ankle arthralgias.  Restarting as of 10/2015 (08/2016 CT showed some disease regression  . Esophageal cancer ([place])    CT 12/2016 showed no residual esoph mass--plan to continue chemo.  Ongoing palliative chemo with carboplatin and taxol q 2 wks as of 03/2017 onc f/u (CT 12/2016 and 04/2017 showed no progression of dz).  Taking a 2 mo break from chemo as of 05/2017.  Pleural effusion-- improved with lasix but onc wanted him to get dx/therap thoracentesis-pt declined.  CT C/A/P no progression/recurr/mets 11/2017.  Marland Kitchen Esophageal cancer ([place])    06/16/18 surveillance CTs showed no evidence of dz recurrence.  . Essential hypertension 05/01/2007   Qualifier: Diagnosis of  By: Tiney Rouge CMA, Ellison Hughs     . History of kidney stones   . Hyperkalemia 12/2015   Decreased ACE-I by 50% in response, then potassium normalized.  Marland Kitchen HYPERLIPIDEMIA 06/24/2007   03/2018 lipids at goal  . Hypoxemia 01/27/2010  . Morbid obesity ([place])   . Myocardial infarction [facility])    pt states he was informed per MD that he has had one but pt was unaware   . Obesity hypoventilation syndrome (HCC)    oxygen 24/7 (2 liters [place] as of 02/2016)  . On home oxygen therapy    Oxygen @ 2l/m nasally 24/7 hours  . OSA (obstructive sleep apnea)    not tested; pt scored 4 per stop bang tool results sent to PCP   . OSTEOARTHRITIS 05/01/2007  . Pancytopenia due to chemotherapy ([place]) 2018  . Pleural effusion on right 08/2017   ? malignant vs CHF: pt refuses diagnostic thoracentesis b/c he states he feels fine, wants to try diuretic first.  . Pleural effusion on right 09/2017   Thoracentesis  . Presbycusis of both ears 05/2015   [facility]  . Pruritic condition 05/2014   Allergist summer 2015, no new testing.  Marland Kitchen RBBB   . Visual field defect 05/10/2016   Per Dr. Melina Fiddler, O.D.: Rt, loss of inf/temp quad and some loss sup/temp.  ?CVA  ? Pituitary tumor? ?Brain  met.  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    Past Surgical History:  Procedure Laterality Date  . APPENDECTOMY    . BALLOON DILATION N/A 05/04/2016   Procedure: BALLOON DILATION;  Surgeon: Jerene Bears, MD;  Location: [facility];  Service: Gastroenterology;  Laterality: N/A;  . BIOPSY  03/19/2018   Procedure: BIOPSY;  Surgeon: Jackquline Denmark, MD;  Location: [facility];  Service: Endoscopy;;  . CARDIAC CATHETERIZATION    . CARPAL TUNNEL RELEASE Left   . CATARACT EXTRACTION W/PHACO Right 04/29/2013   Procedure: CATARACT EXTRACTION PHACO AND INTRAOCULAR LENS PLACEMENT (IOC);  Surgeon: Adonis Brook, MD;  Location: [facility];  Service: Ophthalmology;  Laterality: Right;  . CATARACT EXTRACTION, BILATERAL    . COLONOSCOPY W/ POLYPECTOMY  08/2011  Many polyps--all hyperplastic, severe diverticulosis, int hem.  [employer] hemoccult testing via [facility] 06/22/15 NEG  . ESOPHAGOGASTRODUODENOSCOPY N/A 05/04/2016   Procedure: ESOPHAGOGASTRODUODENOSCOPY (EGD);  Surgeon: Jerene Bears, MD;  Location: [facility];  Service: Gastroenterology;  Laterality: N/A;  . ESOPHAGOGASTRODUODENOSCOPY (EGD) WITH PROPOFOL N/A 03/19/2018   Gastric ulcers, esoph ulcer (radiation-induced?), bx-->path neg for malignancy or Barrett's.  GI plans repeat EGD 6-8 wks after 1st.   Procedure: ESOPHAGOGASTRODUODENOSCOPY (EGD) WITH PROPOFOL;  Surgeon: Jackquline Denmark, MD;  [facility]: [facility];  Service: Endoscopy;  Laterality: N/A;  . ESOPHAGOGASTRODUODENOSCOPY (EGD) WITH PROPOFOL N/A 04/28/2018   Esoph ulcer healed.  Grade I and small (< 5 mm) distal esophageal varices.  Mild portal hypertensive gastropathy.  No evidence of malignance.  Procedure: ESOPHAGOGASTRODUODENOSCOPY (EGD) WITH PROPOFOL;  Surgeon: Jerene Bears, MD;  [facility]: [facility];  Service: Gastroenterology;  Laterality: N/A;  . IR GENERIC HISTORICAL  10/03/2016   IR US GUIDE VASC ACCESS RIGHT 10/03/2016 Greggory Keen, MD [facility]  . IR GENERIC  HISTORICAL  10/03/2016   IR FLUORO GUIDE PORT INSERTION RIGHT 10/03/2016 Greggory Keen, MD [facility]  . KNEE ARTHROSCOPY WITH MEDIAL MENISECTOMY Right 12/16/2014   Procedure: RIGHT KNEE ARTHROSCOPY WITH MEDIAL MENISECTOMY microfracture medial femoral condyle abrasion condroplasty medial femoral condyle lateral menisectomy;  Surgeon: Tobi Bastos, MD;  Location: [facility];  Service: Orthopedics;  Laterality: Right;  . LEFT HEART CATHETERIZATION WITH CORONARY ANGIOGRAM N/A 10/26/2013   Procedure: LEFT HEART CATHETERIZATION WITH CORONARY ANGIOGRAM;  Surgeon: Jettie Booze, MD;  Location[facility] C[facility] LAB;  Service: Cardiovascular;  Laterality: N/A;  . LUMBAR [place] SURGERY  2001  . SHOULDER SURGERY Right    right fx  . TONSILLECTOMY    . TRANSTHORACIC ECHOCARDIOGRAM  03/2016; 10/2018   2017: EF 55-60%, grade I DD, LAE.  Jan 2020->new anteroapical akinesis, EF 40-45%-->may need R and L HC, but cardiologist recommends CT angio chest first.  . WRIST SURGERY Right    right fx    Current Medications: Current Meds  Medication Sig  . atorvastatin (LIPITOR) 80 MG tablet TAKE 1 TABLET BY MOUTH ONCE DAILY  . baclofen (LIORESAL) 10 MG tablet Take 10 mg by mouth every 8 (eight) hours.  . beta carotene w/minerals (OCUVITE) tablet Take 1 tablet by mouth daily.  . cetirizine (ZYRTEC) 10 MG tablet Take 10 mg by mouth every evening.   . citalopram (CELEXA) 20 MG tablet TAKE 1 TABLET BY MOUTH ONCE DAILY  . clonazePAM (KLONOPIN) 1 MG tablet Take 1 tablet (1 mg total) by mouth 2 (two) times daily as needed. for anxiety  . clotrimazole-betamethasone (LOTRISONE) cream APPLY  CREAM TOPICALLY TO AFFECTED AREA TWICE DAILY  . ezetimibe (ZETIA) 10 MG tablet Take 10 mg by mouth at bedtime.  . furosemide (LASIX) 20 MG tablet TAKE 1 TABLET BY MOUTH ONCE DAILY  . gabapentin (NEURONTIN) 300 MG capsule TAKE 1 CAPSULE BY MOUTH THREE TIMES DAILY  . HUMULIN N KWIKPEN 100 UNIT/ML Kiwkpen INJECT 16 UNITS SUBCUTANEOUSLY IN THE  MORNING AND 16 IN THE EVENING  . hydrOXYzine (ATARAX/VISTARIL) 25 MG tablet TAKE ONE TABLET BY MOUTH AT SUPPER AND 1 TABLET AT BEDTIME FOR INSOMNIA  . Insulin Lispro (HUMALOG KWIKPEN) 200 UNIT/ML SOPN Inject 20 Units into the skin 2 (two) times daily.  . magnesium chloride (SLOW-MAG) 64 MG TBEC SR tablet Take 1 tablet (64 mg total) by mouth daily.  . metFORMIN (GLUCOPHAGE) 1000 MG tablet TAKE 1 TABLET BY MOUTH TWICE DAILY WITH MEALS  .  Omega-3 Fatty Acids (FISH OIL) 1200 MG CAPS Take 2,400 mg by mouth 2 (two) times daily.  . ONE TOUCH ULTRA TEST test strip USE 1 STRIP TO CHECK GLUCOSE THREE TIMES DAILY AS DIRECTED  . ONETOUCH DELICA LANCETS 96Q MISC USE   TO CHECK GLUCOSE THREE TIMES DAILY  . OXYGEN Inhale 2 L into the lungs continuous.   . pantoprazole (PROTONIX) 40 MG tablet TAKE 1 TABLET BY MOUTH TWICE DAILY  . [DISCONTINUED] ezetimibe (ZETIA) 10 MG tablet TAKE 1 TABLET BY MOUTH ONCE DAILY (Patient taking differently: TAKE 1 TABLET BY MOUTH ONCE DAILY AT BEDTIME)     Allergies:   Patient has no known allergies.   Social History   Socioeconomic History  . Marital status: Married    Spouse name: susan  . Number of children: 2  . Years of education: Not on file  . Highest education level: Not on file  Occupational History  . Occupation: Retired  Scientific laboratory technician  . Financial resource strain: Not on file  . Food insecurity:    Worry: Not on file    Inability: Not on file  . Transportation needs:    Medical: Not on file    Non-medical: Not on file  Tobacco Use  . Smoking status: Former Smoker    Packs/day: 1.50    Years: 30.00    Pack years: 45.00    Types: Cigarettes, Pipe, Cigars    Last attempt to quit: 10/30/1983    Years since quitting: 35.1  . Smokeless tobacco: Current User    Types: Chew    Last attempt to quit: 05/15/2016  Substance and Sexual Activity  . Alcohol use: No  . Drug use: No  . Sexual activity: Not Currently  Lifestyle  . Physical activity:    Days per  week: Not on file    Minutes per session: Not on file  . Stress: Not on file  Relationships  . Social connections:    Talks on phone: Not on file    Gets together: Not on file    Attends religious service: Not on file    Active member of club or organization: Not on file    Attends meetings of clubs or organizations: Not on file    Relationship status: Not on file  Other Topics Concern  . Not on file  Social History Narrative   Married, one son and one daughter.   His daughter and her two children live with him.   Coffee daily.  Former smoker.  No alcohol.   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   Attends church weekly-   Oxygen continuous     Family History:  The patient's family history includes Alcohol abuse in his father; Aneurysm in his father; Diabetes in his maternal aunt; Heart attack in his mother.   ROS:   Please see the history of present illness.    Review of Systems  Constitution: Negative.  HENT: Negative.   Cardiovascular: Positive for dyspnea on exertion.  Respiratory: Negative.   Endocrine: Negative.   Hematologic/Lymphatic: Negative.   Musculoskeletal: Negative.   Gastrointestinal: Negative.   Genitourinary: Negative.   Neurological: Negative.    All other systems reviewed and are negative.   PHYSICAL EXAM:   VS:  BP 130/70 (BP Location: Left Arm, Patient Position: Sitting, Cuff Size: Normal)   Pulse 91   Ht '5\' 8"'  (1.727 m)   Wt 269 lb (122 kg)   SpO2 Marland Kitchen)  89% Comment: on 2L of 02  BMI 40.90 kg/m   Physical Exam  GEN: Well nourished, well developed, in no acute distress  HEENT: normal  Neck: no JVD, carotid bruits, or masses Cardiac:RRR; no murmurs, rubs, or gallops  Respiratory:  clear to auscultation bilaterally, normal work of breathing GI: soft, nontender, nondistended, + BS Ext: without cyanosis, clubbing, or edema, Good distal pulses bilaterally MS: no deformity or atrophy  Skin: warm and dry, no  rash Neuro:  Alert and Oriented x 3 Psych: euthymic mood, full affect  Wt Readings from Last 3 Encounters:  12/10/18 269 lb (122 kg)  11/20/18 273 lb (123.8 kg)  10/24/18 277 lb 8 oz (125.9 kg)      Studies/Labs Reviewed:   EKG:  EKG is not ordered today.  Recent Labs: 09/18/2018: ALT 23; BUN 14; Creatinine 0.96; Hemoglobin 10.0; Platelets 147; Potassium 4.0; Sodium 138 11/20/2018: NT-Pro BNP 90   Lipid Panel    Component Value Date/Time   CHOL 97 (L) 04/24/2018 0951   TRIG 144 04/24/2018 0951   TRIG 268 (HH) 09/06/2006 1555   HDL 36 (L) 04/24/2018 0951   CHOLHDL 2.7 04/24/2018 0951   CHOLHDL 3.6 03/12/2016 1001   VLDL 37 (H) 03/12/2016 1001   LDLCALC 32 04/24/2018 0951   LDLDIRECT 74.0 04/15/2015 0838    Additional studies/ records that were reviewed today include:  2Decho 11/25/18 IMPRESSIONS      1. The left ventricle has mild-moderately reduced systolic function of 16-07%. The cavity size is normal. There is no increased left ventricular wall thickness. Echo evidence of impaired diastolic relaxation. Normal left ventricular filling pressures.  2. There is severe hypokinesis of the apical anteroseptal, anterior and apical left ventricular segments.  3. The right ventricle has normal systolic function. The cavity in normal in size. There is no increase in right ventricular wall thickness. Right ventricular systolic pressure could not be assessed.  4. Mildly dilated left atrial size.  5. Mild mitral annular calcification.  6. Pulmonic valve regurgitation is mild.  7. The aortic root and ascending aorta are normal in size and structure.  8. No intracardiac thrombi or masses were visualized.  9. Compared to 2017, there is a new anteroapical wall motion abnormality consistent with interval infarction in the mid-distal LAD territory.   Cardiac catheterization 09/2013 ANGIOGRAPHIC DATA:   The left main coronary artery is patent.   The left anterior descending artery is a  large vessel.  There are several areas of complete and subtotal occlusion in the mid vessel.  Competitive flow noted in the distal LAD.  Medium sized diagonal with a 70% stenosis.   The left circumflex artery is a large vessel with mild irregularities.  There is a large OM1 which is widely patent.     The right coronary artery is a large dominant vessel with a 60-70% mid vessel lesion in some views. There is a medium sized PDA and PLA which have mild disease.   LEFT VENTRICULOGRAM:  Left ventricular angiogram was done in the 30 RAO projection and revealed anterior hypokinesis with low normal systolic function with an estimated ejection fraction of 50%.  LVEDP 20 was mmHg.   IMPRESSIONS:   1. Normal left main coronary artery. 2. 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 3. Mild disease in the  left circumflex artery and its branches. 4. Moderate disease in the mid right coronary artery. 5. Low normal left ventricular systolic function.  LVEDP 20 mmHg.  Ejection  fraction 50%.   RECOMMENDATION:  Increase medical therapy.  Start Imdur and Plavix.  If sx continue, would consider PCI of CTO of the LAD.        Electronically signed by Jettie Booze, MD at 10/26/2013 10:07 AM      ASSESSMENT:    1. Dyspnea on exertion   2. Ischemic cardiomyopathy   3. Atherosclerosis of native coronary artery of native heart without angina pectoris   4. Chronic diastolic CHF (congestive heart failure) ([place])   5. Essential hypertension   6. DM (diabetes mellitus) type II, controlled, with peripheral vascular disorder ([place])   7. Chronic obstructive pulmonary disease, unspecified COPD type ([place])   8. Dyslipidemia associated with type 2 diabetes mellitus ([place])   9. Pre-procedure lab exam      PLAN:  In order of problems listed above:  Dyspnea on exertion  He is on chronic O2 for obesity hypoventilation syndrome.  And to have new LV dysfunction EF 40 to 45% on echo and  Dr. Radford Pax recommends left and right heart catheterization for further evaluation.  There was miscommunication and he did not realize he was being seen today for right and left heart catheterization he ended he is currently refusing to have it done.  His daughter who is not here today wants him to proceed with it.  We will draw labs and he will call us back if he changes his mind. I have reviewed the risks, indications, and alternatives to angioplasty and stenting with the patient. Risks include but are not limited to bleeding, infection, vascular injury, stroke, myocardial infection, arrhythmia, kidney injury, radiation-related injury in the case of prolonged fluoroscopy use, emergency cardiac surgery, and death. The patient understands the risks of serious complication is low (<6%) and patient agrees to proceed.     Ischemic cardiomyopathy EF now 40 to 45% which is new wall motion abnormality.  Cardiac catheterization recommended by Dr. Radford Pax.  CAD cardiac cath in 2014 with CTO of the LAD moderate disease in the RCA EF 50% at that time.  Denies angina.  Has been off aspirin because of GE junction adenocarcinoma.  Chronic diastolic CHF compensated  Essential hypertension controlled  Diabetes mellitus managed by PCP  COPD on home O2  Dyslipidemia on Lipitor 80 mg daily  Preprocedure labs will check labs for cardiac catheterization in case patient changes his mind.     Medication Adjustments/Labs and Tests Ordered: Current medicines are reviewed at length with the patient today.  Concerns regarding medicines are outlined above.  Medication changes, Labs and Tests ordered today are listed in the Patient Instructions below. Patient Instructions  Medication Instructions:  Your physician recommends that you continue on your current medications as directed. Please refer to the Current Medication list given to you today.  If you need a refill on your cardiac medications before your next  appointment, please call your pharmacy.   Lab work: TODAY: CBC, BMET  If you have labs (blood work) drawn today and your tests are completely normal, you will receive your results only by: Marland Kitchen MyChart Message (if you have MyChart) OR . A paper copy in the mail If you have any lab test that is abnormal or we need to change your treatment, we will call you to review the results.  Testing/Procedures: None ordered  Follow-Up: At [facility], you and your health needs are our priority.  As part of our continuing mission to provide you with exceptional heart care, we have created designated  Provider Care Teams.  These Care Teams include your primary Cardiologist (physician) and Advanced Practice Providers (APPs -  Physician Assistants and Nurse Practitioners) who all work together to provide you with the care you need, when you need it. . You will need a follow up appointment next available.  You may see Fransico Him, MD or one of the following Advanced Practice Providers on your designated Care Team:   . Lyda Jester, PA-C . Dayna Dunn, PA-C . Ermalinda Barrios, PA-C  Any Other Special Instructions Will Be Listed Below (If Applicable).       Sumner Boast, PA-C  12/10/2018 12:31 PM    [facility] [address], [place], [place]  [address] Phone: [phone]; Fax: [phone]

## 2018-12-11 LAB — D-DIMER, QUANTITATIVE: D-DIMER: 6.76 mg/L FEU — ABNORMAL HIGH (ref 0.00–0.49)

## 2018-12-11 NOTE — Telephone Encounter (Signed)
Patient's daughter called stating her father went to the ER yesterday evening as advised, they did not find a blood clot.  She said they did a CT scan and they didn't find anything She wants to know what he should do next.

## 2018-12-11 NOTE — Telephone Encounter (Signed)
He needs the right and left heart cath

## 2018-12-11 NOTE — Telephone Encounter (Signed)
Spoke to patient's daughter Britt Boozer) who informed us that the patient went to the ED and no clot was revealed.  She is inquiring about the next step to his care.  I told her that I would forward to Dr Radford Pax and her nurse Suezanne Jacquet).  She thanked me for the call.

## 2018-12-12 NOTE — Telephone Encounter (Signed)
Spoke with the daughter, she is going to discuss with her father this weekend about the Cath. He is having difficulty remembering that he had a cath in the first place. She said that she would call on Monday to discuss there decision. She had no further questions.

## 2018-12-12 NOTE — Telephone Encounter (Signed)
Patient's daughter called back stating she was speaking with nurse and got disconnected.

## 2018-12-15 NOTE — Telephone Encounter (Signed)
Spoke with the daughter, he accepted having a cath and she was instructed. She will pick up the instructions tomorrow. She is schedule with Dr. Ellyn Hack at 11:30 on 12/17/18. She had no further questions.

## 2018-12-16 ENCOUNTER — Telehealth: Payer: Self-pay | Admitting: Cardiology

## 2018-12-16 NOTE — Telephone Encounter (Signed)
Please document if you are ok with adding ASA and Plavix if he I found to have CAD needing stenting tomorrow

## 2018-12-16 NOTE — Telephone Encounter (Signed)
Dr. Radford Pax, he has been off chemo for a while, and I do not plan to give chemo in the near future. So yes he is OK to have ASA and Plavix if needed.   Truitt Merle MD

## 2018-12-17 ENCOUNTER — Ambulatory Visit (HOSPITAL_COMMUNITY)
Admission: RE | Admit: 2018-12-17 | Discharge: 2018-12-17 | Disposition: A | Discharge status: Home / Self Care | Payer: Medicare Other | Attending: Cardiology | Admitting: Cardiology

## 2018-12-17 ENCOUNTER — Other Ambulatory Visit: Payer: Self-pay

## 2018-12-17 ENCOUNTER — Encounter (HOSPITAL_COMMUNITY)
Admission: RE | Disposition: A | Discharge status: Home / Self Care | Payer: Self-pay | Source: Home / Self Care | Attending: Cardiology

## 2018-12-17 DIAGNOSIS — I5032 Chronic diastolic (congestive) heart failure: Secondary | ICD-10-CM | POA: Diagnosis not present

## 2018-12-17 DIAGNOSIS — I2510 Atherosclerotic heart disease of native coronary artery without angina pectoris: Secondary | ICD-10-CM | POA: Insufficient documentation

## 2018-12-17 DIAGNOSIS — Z8249 Family history of ischemic heart disease and other diseases of the circulatory system: Secondary | ICD-10-CM | POA: Insufficient documentation

## 2018-12-17 DIAGNOSIS — Z9981 Dependence on supplemental oxygen: Secondary | ICD-10-CM | POA: Insufficient documentation

## 2018-12-17 DIAGNOSIS — Z87891 Personal history of nicotine dependence: Secondary | ICD-10-CM | POA: Diagnosis not present

## 2018-12-17 DIAGNOSIS — R0609 Other forms of dyspnea: Secondary | ICD-10-CM | POA: Diagnosis present

## 2018-12-17 DIAGNOSIS — I252 Old myocardial infarction: Secondary | ICD-10-CM | POA: Insufficient documentation

## 2018-12-17 DIAGNOSIS — M1990 Unspecified osteoarthritis, unspecified site: Secondary | ICD-10-CM | POA: Insufficient documentation

## 2018-12-17 DIAGNOSIS — I130 Hypertensive heart and chronic kidney disease with heart failure and stage 1 through stage 4 chronic kidney disease, or unspecified chronic kidney disease: Secondary | ICD-10-CM | POA: Diagnosis not present

## 2018-12-17 DIAGNOSIS — I255 Ischemic cardiomyopathy: Secondary | ICD-10-CM | POA: Diagnosis present

## 2018-12-17 DIAGNOSIS — I2584 Coronary atherosclerosis due to calcified coronary lesion: Secondary | ICD-10-CM | POA: Diagnosis not present

## 2018-12-17 DIAGNOSIS — Z794 Long term (current) use of insulin: Secondary | ICD-10-CM | POA: Insufficient documentation

## 2018-12-17 DIAGNOSIS — Z79899 Other long term (current) drug therapy: Secondary | ICD-10-CM | POA: Insufficient documentation

## 2018-12-17 DIAGNOSIS — E1122 Type 2 diabetes mellitus with diabetic chronic kidney disease: Secondary | ICD-10-CM | POA: Diagnosis not present

## 2018-12-17 DIAGNOSIS — Z6841 Body Mass Index (BMI) 40.0 and over, adult: Secondary | ICD-10-CM | POA: Insufficient documentation

## 2018-12-17 DIAGNOSIS — K7460 Unspecified cirrhosis of liver: Secondary | ICD-10-CM | POA: Insufficient documentation

## 2018-12-17 DIAGNOSIS — N183 Chronic kidney disease, stage 3 (moderate): Secondary | ICD-10-CM | POA: Insufficient documentation

## 2018-12-17 DIAGNOSIS — G4733 Obstructive sleep apnea (adult) (pediatric): Secondary | ICD-10-CM | POA: Diagnosis not present

## 2018-12-17 DIAGNOSIS — J449 Chronic obstructive pulmonary disease, unspecified: Secondary | ICD-10-CM | POA: Insufficient documentation

## 2018-12-17 DIAGNOSIS — Z7902 Long term (current) use of antithrombotics/antiplatelets: Secondary | ICD-10-CM | POA: Insufficient documentation

## 2018-12-17 DIAGNOSIS — E785 Hyperlipidemia, unspecified: Secondary | ICD-10-CM | POA: Diagnosis not present

## 2018-12-17 HISTORY — PX: RIGHT/LEFT HEART CATH AND CORONARY ANGIOGRAPHY: CATH118266

## 2018-12-17 LAB — POCT I-STAT 7, (LYTES, BLD GAS, ICA,H+H)
ACID-BASE EXCESS: 8 mmol/L — AB (ref 0.0–2.0)
Acid-Base Excess: 8 mmol/L — ABNORMAL HIGH (ref 0.0–2.0)
Bicarbonate: 33.8 mmol/L — ABNORMAL HIGH (ref 20.0–28.0)
Bicarbonate: 34.8 mmol/L — ABNORMAL HIGH (ref 20.0–28.0)
Calcium, Ion: 1.16 mmol/L (ref 1.15–1.40)
Calcium, Ion: 1.17 mmol/L (ref 1.15–1.40)
HCT: 32 % — ABNORMAL LOW (ref 39.0–52.0)
HCT: 33 % — ABNORMAL LOW (ref 39.0–52.0)
Hemoglobin: 10.9 g/dL — ABNORMAL LOW (ref 13.0–17.0)
Hemoglobin: 11.2 g/dL — ABNORMAL LOW (ref 13.0–17.0)
O2 Saturation: 90 %
O2 Saturation: 90 %
PH ART: 7.387 (ref 7.350–7.450)
PO2 ART: 60 mmHg — AB (ref 83.0–108.0)
PO2 ART: 62 mmHg — AB (ref 83.0–108.0)
Potassium: 3.8 mmol/L (ref 3.5–5.1)
Potassium: 4 mmol/L (ref 3.5–5.1)
Sodium: 138 mmol/L (ref 135–145)
Sodium: 138 mmol/L (ref 135–145)
TCO2: 35 mmol/L — ABNORMAL HIGH (ref 22–32)
TCO2: 36 mmol/L — ABNORMAL HIGH (ref 22–32)
pCO2 arterial: 52.8 mmHg — ABNORMAL HIGH (ref 32.0–48.0)
pCO2 arterial: 57.8 mmHg — ABNORMAL HIGH (ref 32.0–48.0)
pH, Arterial: 7.414 (ref 7.350–7.450)

## 2018-12-17 LAB — POCT I-STAT EG7
Acid-Base Excess: 7 mmol/L — ABNORMAL HIGH (ref 0.0–2.0)
Acid-Base Excess: 8 mmol/L — ABNORMAL HIGH (ref 0.0–2.0)
Bicarbonate: 34.4 mmol/L — ABNORMAL HIGH (ref 20.0–28.0)
Bicarbonate: 35.1 mmol/L — ABNORMAL HIGH (ref 20.0–28.0)
Calcium, Ion: 1.19 mmol/L (ref 1.15–1.40)
Calcium, Ion: 1.19 mmol/L (ref 1.15–1.40)
HCT: 32 % — ABNORMAL LOW (ref 39.0–52.0)
HCT: 33 % — ABNORMAL LOW (ref 39.0–52.0)
Hemoglobin: 10.9 g/dL — ABNORMAL LOW (ref 13.0–17.0)
Hemoglobin: 11.2 g/dL — ABNORMAL LOW (ref 13.0–17.0)
O2 SAT: 59 %
O2 Saturation: 58 %
PO2 VEN: 32 mmHg (ref 32.0–45.0)
Potassium: 3.9 mmol/L (ref 3.5–5.1)
Potassium: 3.9 mmol/L (ref 3.5–5.1)
Sodium: 139 mmol/L (ref 135–145)
Sodium: 139 mmol/L (ref 135–145)
TCO2: 36 mmol/L — ABNORMAL HIGH (ref 22–32)
TCO2: 37 mmol/L — ABNORMAL HIGH (ref 22–32)
pCO2, Ven: 60.3 mmHg — ABNORMAL HIGH (ref 44.0–60.0)
pCO2, Ven: 61.1 mmHg — ABNORMAL HIGH (ref 44.0–60.0)
pH, Ven: 7.365 (ref 7.250–7.430)
pH, Ven: 7.368 (ref 7.250–7.430)
pO2, Ven: 33 mmHg (ref 32.0–45.0)

## 2018-12-17 LAB — GLUCOSE, CAPILLARY: Glucose-Capillary: 111 mg/dL — ABNORMAL HIGH (ref 70–99)

## 2018-12-17 SURGERY — RIGHT/LEFT HEART CATH AND CORONARY ANGIOGRAPHY
Anesthesia: LOCAL

## 2018-12-17 MED ORDER — VERAPAMIL HCL 2.5 MG/ML IV SOLN
INTRAVENOUS | Status: AC
  Filled 2018-12-17: qty 2

## 2018-12-17 MED ORDER — LIDOCAINE HCL (PF) 1 % IJ SOLN
INTRAMUSCULAR | Status: DC | PRN
  Administered 2018-12-17: 3 mL
  Administered 2018-12-17 (×2): 2 mL

## 2018-12-17 MED ORDER — SODIUM CHLORIDE 0.9 % IV SOLN: 250.0000 mL | INTRAVENOUS | Status: DC | PRN

## 2018-12-17 MED ORDER — SODIUM CHLORIDE 0.9 % IV SOLN: INTRAVENOUS | Status: DC

## 2018-12-17 MED ORDER — ONDANSETRON HCL 4 MG/2ML IJ SOLN: 4.0000 mg | Freq: Four times a day (QID) | INTRAMUSCULAR | Status: DC | PRN

## 2018-12-17 MED ORDER — SODIUM CHLORIDE 0.9% FLUSH: 3.0000 mL | Freq: Two times a day (BID) | INTRAVENOUS | Status: DC

## 2018-12-17 MED ORDER — FENTANYL CITRATE (PF) 100 MCG/2ML IJ SOLN
INTRAMUSCULAR | Status: DC | PRN
  Administered 2018-12-17: 12.5 ug via INTRAVENOUS

## 2018-12-17 MED ORDER — MIDAZOLAM HCL 2 MG/2ML IJ SOLN
INTRAMUSCULAR | Status: DC | PRN
  Administered 2018-12-17: 0.5 mg via INTRAVENOUS

## 2018-12-17 MED ORDER — FENTANYL CITRATE (PF) 100 MCG/2ML IJ SOLN
INTRAMUSCULAR | Status: AC
  Filled 2018-12-17: qty 2

## 2018-12-17 MED ORDER — NITROGLYCERIN 1 MG/10 ML FOR IR/CATH LAB
INTRA_ARTERIAL | Status: DC | PRN
  Administered 2018-12-17: 200 ug via INTRACORONARY

## 2018-12-17 MED ORDER — SODIUM CHLORIDE 0.9 % IV SOLN
INTRAVENOUS | Status: DC
  Administered 2018-12-17: 11:00:00 via INTRAVENOUS

## 2018-12-17 MED ORDER — IOHEXOL 350 MG/ML SOLN
INTRAVENOUS | Status: DC | PRN
  Administered 2018-12-17: 80 mL via INTRA_ARTERIAL

## 2018-12-17 MED ORDER — SODIUM CHLORIDE 0.9% FLUSH: 3.0000 mL | INTRAVENOUS | Status: DC | PRN

## 2018-12-17 MED ORDER — HEPARIN (PORCINE) IN NACL 1000-0.9 UT/500ML-% IV SOLN
INTRAVENOUS | Status: AC
  Filled 2018-12-17: qty 1000

## 2018-12-17 MED ORDER — LIDOCAINE HCL (PF) 1 % IJ SOLN
INTRAMUSCULAR | Status: AC
  Filled 2018-12-17: qty 30

## 2018-12-17 MED ORDER — VERAPAMIL HCL 2.5 MG/ML IV SOLN
INTRAVENOUS | Status: DC | PRN
  Administered 2018-12-17: 10 mL via INTRA_ARTERIAL

## 2018-12-17 MED ORDER — ASPIRIN 81 MG PO CHEW: 81.0000 mg | CHEWABLE_TABLET | ORAL | Status: DC

## 2018-12-17 MED ORDER — MIDAZOLAM HCL 2 MG/2ML IJ SOLN
INTRAMUSCULAR | Status: AC
  Filled 2018-12-17: qty 2

## 2018-12-17 MED ORDER — HEPARIN (PORCINE) IN NACL 1000-0.9 UT/500ML-% IV SOLN
INTRAVENOUS | Status: DC | PRN
  Administered 2018-12-17: 500 mL

## 2018-12-17 MED ORDER — NITROGLYCERIN 1 MG/10 ML FOR IR/CATH LAB
INTRA_ARTERIAL | Status: AC
  Filled 2018-12-17: qty 10

## 2018-12-17 MED ORDER — ACETAMINOPHEN 325 MG PO TABS: 650.0000 mg | ORAL_TABLET | ORAL | Status: DC | PRN

## 2018-12-17 MED ORDER — HEPARIN SODIUM (PORCINE) 1000 UNIT/ML IJ SOLN
INTRAMUSCULAR | Status: DC | PRN
  Administered 2018-12-17: 6000 [IU] via INTRAVENOUS

## 2018-12-17 SURGICAL SUPPLY — 15 items
CATH BALLN WEDGE 5F 110CM (CATHETERS) ×2 IMPLANT
CATH INFINITI 5 FR JL3.5 (CATHETERS) ×2 IMPLANT
CATH OPTITORQUE TIG 4.0 5F (CATHETERS) ×2 IMPLANT
DEVICE RAD COMP TR BAND LRG (VASCULAR PRODUCTS) ×2 IMPLANT
GLIDESHEATH SLEND A-KIT 6F 22G (SHEATH) ×2 IMPLANT
GUIDEWIRE INQWIRE 1.5J.035X260 (WIRE) ×1 IMPLANT
INQWIRE 1.5J .035X260CM (WIRE) ×2
KIT HEART LEFT (KITS) ×2 IMPLANT
PACK CARDIAC CATHETERIZATION (CUSTOM PROCEDURE TRAY) ×2 IMPLANT
SHEATH GLIDE SLENDER 4/5FR (SHEATH) ×4 IMPLANT
SHEATH PROBE COVER 6X72 (BAG) ×2 IMPLANT
TRANSDUCER W/STOPCOCK (MISCELLANEOUS) ×2 IMPLANT
TUBING CIL FLEX 10 FLL-RA (TUBING) ×2 IMPLANT
WIRE EMERALD 3MM-J .025X260CM (WIRE) ×2 IMPLANT
WIRE MICROINTRODUCER 60CM (WIRE) ×2 IMPLANT

## 2018-12-17 NOTE — Progress Notes (Signed)
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 not consumed alcohol in 30 years.  -His prior CT scans showed evidence of liver cirrhosis and also showed mild to moderate ascites -He had no known history of liver disease, this could be related to fatty liver or congestive heart failure.   -Continue diuretics, and follow-up   9. Goal of care discussion, DNR/DNI -We previously discussed the incurable nature of his cancer, and the overall poor prognosis, and he agreed with DNR/DNI -however he has been NED for 1.5 yeas for now it will be reasonable to rever his code status to full code if needed     PLAN Lab and recent CT scan reviewed, no evidence of recurrence  Lab, port flush and f/u in 6 months  Port flush every 6 weeks X4  No problem-specific Assessment & Plan notes found for this encounter.   No orders of the defined types were placed in this encounter.  All questions were answered. The patient knows to call the clinic with any problems, questions or concerns. No barriers to learning was detected. I spent 20 minutes counseling the patient face to face. The total time spent in the  appointment was 25 minutes and more than 50% was on counseling and review of test results     Truitt Merle, MD 12/19/2018   I, Joslyn Devon, am acting as scribe for Truitt Merle, MD.   I have reviewed the above documentation for accuracy and completeness, and I agree with the above.

## 2018-12-17 NOTE — Discharge Instructions (Signed)
° °DRINK PLENTY OF FLUIDS FOR THE NEXT 2-3 DAYS TO KEEP HYDRATED. ° ° °HOLD METFORMIN FOR A FULL 48 HOURS AFTER DISCHARGE. ° °Radial Site Care ° °This sheet gives you information about how to care for yourself after your procedure. Your health care provider may also give you more specific instructions. If you have problems or questions, contact your health care provider. °What can I expect after the procedure? °After the procedure, it is common to have: °· Bruising and tenderness at the catheter insertion area. °Follow these instructions at home: °Medicines °· Take over-the-counter and prescription medicines only as told by your health care provider. °Insertion site care °· Follow instructions from your health care provider about how to take care of your insertion site. Make sure you: °? Wash your hands with soap and water before you change your bandage (dressing). If soap and water are not available, use hand sanitizer. °? Change your dressing as told by your health care provider. °? Leave stitches (sutures), skin glue, or adhesive strips in place. These skin closures may need to stay in place for 2 weeks or longer. If adhesive strip edges start to loosen and curl up, you may trim the loose edges. Do not remove adhesive strips completely unless your health care provider tells you to do that. °· Check your insertion site every day for signs of infection. Check for: °? Redness, swelling, or pain. °? Fluid or blood. °? Pus or a bad smell. °? Warmth. °· Do not take baths, swim, or use a hot tub until your health care provider approves. °· You may shower 24-48 hours after the procedure, or as directed by your health care provider. °? Remove the dressing and gently wash the site with plain soap and water. °? Pat the area dry with a clean towel. °? Do not rub the site. That could cause bleeding. °· Do not apply powder or lotion to the site. °Activity ° °· For 24 hours after the procedure, or as directed by your health  care provider: °? Do not flex or bend the affected arm. °? Do not push or pull heavy objects with the affected arm. °? Do not drive yourself home from the hospital or clinic. You may drive 24 hours after the procedure unless your health care provider tells you not to. °? Do not operate machinery or power tools. °· Do not lift anything that is heavier than 10 lb (4.5 kg), or the limit that you are told, until your health care provider says that it is safe. °· Ask your health care provider when it is okay to: °? Return to work or school. °? Resume usual physical activities or sports. °? Resume sexual activity. °General instructions °· If the catheter site starts to bleed, raise your arm and put firm pressure on the site. If the bleeding does not stop, get help right away. This is a medical emergency. °· If you went home on the same day as your procedure, a responsible adult should be with you for the first 24 hours after you arrive home. °· Keep all follow-up visits as told by your health care provider. This is important. °Contact a health care provider if: °· You have a fever. °· You have redness, swelling, or yellow drainage around your insertion site. °Get help right away if: °· You have unusual pain at the radial site. °· The catheter insertion area swells very fast. °· The insertion area is bleeding, and the bleeding does not stop when   you hold steady pressure on the area. °· Your arm or hand becomes pale, cool, tingly, or numb. °These symptoms may represent a serious problem that is an emergency. Do not wait to see if the symptoms will go away. Get medical help right away. Call your local emergency services (911 in the [place] Do not drive yourself to the hospital. °Summary °· After the procedure, it is common to have bruising and tenderness at the site. °· Follow instructions from your health care provider about how to take care of your radial site wound. Check the wound every day for signs of infection. °· Do  not lift anything that is heavier than 10 lb (4.5 kg), or the limit that you are told, until your health care provider says that it is safe. °This information is not intended to replace advice given to you by your health care provider. Make sure you discuss any questions you have with your health care provider. °Document Released: 11/17/2010 Document Revised: 11/20/2017 Document Reviewed: 11/20/2017 °Elsevier Interactive Patient Education © 2019 [employer] Inc[employer] ° °

## 2018-12-17 NOTE — Interval H&P Note (Signed)
History and Physical Interval Note:  12/17/2018 11:14 AM  [name]  has presented today for surgery, with the diagnosis of sob/cardiomyopathy with regional wall motion abnormality suggestive of ischemic cardiomyopathy the various methods of treatment have been discussed with the patient and family. After consideration of risks, benefits and other options for treatment, the patient has consented to  Procedure(s): RIGHT/LEFT HEART CATH AND CORONARY ANGIOGRAPHY (N/A) with possible PERCUTANEOUS CORONARY INTERVENTION as a surgical intervention .  The patient's history has been reviewed, patient examined, no change in status, stable for surgery.  I have reviewed the patient's chart and labs.  Questions were answered to the patient's satisfaction.    Cath Lab Visit (complete for each Cath Lab visit)  Clinical Evaluation Leading to the Procedure:   ACS: No.  Non-ACS:    Anginal Classification: CCS II dyspnea on exertion  Anti-ischemic medical therapy: Minimal Therapy (1 class of medications)  Non-Invasive Test Results: Intermediate-risk stress test findings: cardiac mortality 1-3%/year -echocardiogram with reduced EF and wall motion abnormality  Prior CABG: No previous CABG    Glenetta Hew

## 2018-12-18 ENCOUNTER — Encounter (HOSPITAL_COMMUNITY): Payer: Self-pay | Admitting: Cardiology

## 2018-12-18 ENCOUNTER — Other Ambulatory Visit: Payer: Self-pay

## 2018-12-18 DIAGNOSIS — E118 Type 2 diabetes mellitus with unspecified complications: Secondary | ICD-10-CM

## 2018-12-18 DIAGNOSIS — R5381 Other malaise: Secondary | ICD-10-CM

## 2018-12-18 DIAGNOSIS — J449 Chronic obstructive pulmonary disease, unspecified: Secondary | ICD-10-CM

## 2018-12-18 NOTE — Progress Notes (Signed)
Per verbal order from PCP, [facility] referral placed.  

## 2018-12-18 NOTE — Patient Outreach (Signed)
[facility]) Care Management  12/18/2018  MOHIT ZIRBES May 13, 1946 [id number]   Referral Date: 12/18/2018 Referral Source: MD referral Referral Reason: gait instability, assess community needs, home safety per physician referral   Outreach Attempt: spoke with patient and daughter.  Daughter able to verify HIPAA.  She states that she is the caregiver for both her dad and mom.  She states they both have had recent falls, her dad most recent Saturday but no injuries.  She states luckily she was home to assist him up otherwise he would have been on the floor for hours.  She states that patient is independent with care but takes a while due to shortness of breath and health status.  She states patient ambulates with cane and walker.  She states that she requested that someone come to assess her parents and make needed recommendations and provide education.    She states that patient has history of CHF, COPD(oxygen dependent), DM, hyperlipidemia, and hx of esophageal cancer.  She states that patient does not weigh due to stability on scale and risk for falls.     She states that patient has had DM for over 20-30 years and manages well. Last A1c was 6.4.  She states that she manages patient medications and patient takes about 15-20 medications.  Discussed [facility] services for medication review.  She declined.   Patient does have advanced directive and she is the [facility] POA.    Discussed Designer, television/film set for home assessment, home safety and care management.  She is in agreement.  Discussed [place] Education officer, museum.  She declined need presently.      Plan: RN CM will refer patient to community nurse.     Jone Baseman, RN, MSN [facility] Care Management Care Management Coordinator Direct Line [phone] Toll Free: [phone]  Fax: [phone]

## 2018-12-19 ENCOUNTER — Inpatient Hospital Stay (HOSPITAL_BASED_OUTPATIENT_CLINIC_OR_DEPARTMENT_OTHER): Payer: Medicare Other | Admitting: Hematology

## 2018-12-19 ENCOUNTER — Inpatient Hospital Stay: Payer: Medicare Other | Attending: Hematology

## 2018-12-19 ENCOUNTER — Other Ambulatory Visit: Payer: Self-pay | Admitting: *Deleted

## 2018-12-19 ENCOUNTER — Inpatient Hospital Stay: Payer: Medicare Other

## 2018-12-19 ENCOUNTER — Telehealth: Payer: Self-pay | Admitting: Hematology

## 2018-12-19 ENCOUNTER — Encounter: Payer: Self-pay | Admitting: Hematology

## 2018-12-19 ENCOUNTER — Encounter: Payer: Self-pay | Admitting: *Deleted

## 2018-12-19 VITALS — BP 109/58 | HR 77 | Temp 98.4°F | Resp 20 | Ht 68.0 in | Wt 270.2 lb

## 2018-12-19 DIAGNOSIS — Z8501 Personal history of malignant neoplasm of esophagus: Secondary | ICD-10-CM | POA: Diagnosis not present

## 2018-12-19 DIAGNOSIS — E119 Type 2 diabetes mellitus without complications: Secondary | ICD-10-CM

## 2018-12-19 DIAGNOSIS — Z95828 Presence of other vascular implants and grafts: Secondary | ICD-10-CM

## 2018-12-19 DIAGNOSIS — D500 Iron deficiency anemia secondary to blood loss (chronic): Secondary | ICD-10-CM | POA: Insufficient documentation

## 2018-12-19 DIAGNOSIS — C160 Malignant neoplasm of cardia: Secondary | ICD-10-CM

## 2018-12-19 DIAGNOSIS — I5032 Chronic diastolic (congestive) heart failure: Secondary | ICD-10-CM

## 2018-12-19 DIAGNOSIS — I10 Essential (primary) hypertension: Secondary | ICD-10-CM

## 2018-12-19 LAB — CMP (CANCER CENTER ONLY)
ALT: 14 U/L (ref 0–44)
ANION GAP: 9 (ref 5–15)
AST: 19 U/L (ref 15–41)
Albumin: 2.9 g/dL — ABNORMAL LOW (ref 3.5–5.0)
Alkaline Phosphatase: 81 U/L (ref 38–126)
BUN: 8 mg/dL (ref 8–23)
CO2: 33 mmol/L — ABNORMAL HIGH (ref 22–32)
Calcium: 8.6 mg/dL — ABNORMAL LOW (ref 8.9–10.3)
Chloride: 99 mmol/L (ref 98–111)
Creatinine: 0.86 mg/dL (ref 0.61–1.24)
GFR, Estimated: >60 mL/min (ref >60)
Glucose, Bld: 147 mg/dL — ABNORMAL HIGH (ref 70–99)
Potassium: 3.8 mmol/L (ref 3.5–5.1)
Sodium: 141 mmol/L (ref 135–145)
Total Bilirubin: 0.6 mg/dL (ref 0.3–1.2)
Total Protein: 7.4 g/dL (ref 6.5–8.1)

## 2018-12-19 LAB — CBC WITH DIFFERENTIAL/PLATELET
Abs Immature Granulocytes: 0.02 10*3/uL (ref 0.00–0.07)
Basophils Absolute: 0 10*3/uL (ref 0.0–0.1)
Basophils Relative: 1 %
EOS ABS: 0.2 10*3/uL (ref 0.0–0.5)
Eosinophils Relative: 3 %
HCT: 33.8 % — ABNORMAL LOW (ref 39.0–52.0)
Hemoglobin: 10.2 g/dL — ABNORMAL LOW (ref 13.0–17.0)
Immature Granulocytes: 0 %
Lymphocytes Relative: 13 %
Lymphs Abs: 0.9 10*3/uL (ref 0.7–4.0)
MCH: 26.8 pg (ref 26.0–34.0)
MCHC: 30.2 g/dL (ref 30.0–36.0)
MCV: 88.9 fL (ref 80.0–100.0)
Monocytes Absolute: 0.8 10*3/uL (ref 0.1–1.0)
Monocytes Relative: 12 %
Neutro Abs: 5 10*3/uL (ref 1.7–7.7)
Neutrophils Relative %: 71 %
Platelets: 160 10*3/uL (ref 150–400)
RBC: 3.8 MIL/uL — AB (ref 4.22–5.81)
RDW: 19.2 % — ABNORMAL HIGH (ref 11.5–15.5)
WBC: 7 10*3/uL (ref 4.0–10.5)
nRBC: 0 % (ref 0.0–0.2)

## 2018-12-19 MED ORDER — SODIUM CHLORIDE 0.9% FLUSH
10.0000 mL | INTRAVENOUS | Status: DC | PRN
  Administered 2018-12-19: 10 mL via INTRAVENOUS
  Filled 2018-12-19: qty 10

## 2018-12-19 MED ORDER — HEPARIN SOD (PORK) LOCK FLUSH 100 UNIT/ML IV SOLN
500.0000 [IU] | Freq: Once | INTRAVENOUS | Status: AC
  Administered 2018-12-19: 500 [IU] via INTRAVENOUS
  Filled 2018-12-19: qty 5

## 2018-12-19 NOTE — Patient Outreach (Signed)
[facility]) Care Management  12/19/2018  [name] [birth year] [id number]  Telephone Assessment  RN spoke with pt briefly who requested today inquire come form his caregiver daughter Britt Boozer. RN verified identifiers prior to speaking with his daughter Britt Boozer. Introduced the [facility] program and services once again to the daughter and purpose for today's call. Discussed pt's medical issues and possible needs. Verified pt's medical issues (COPD/HF/DM are all intact with no needs or interventions to address. States pt recently has a heart cath done at the cancer center with no evidence of disease. RN further addressed pt's issues and inquired on his recent falls. Daughter indicates no rugs or problematic area in the home however she is caring for both her father and her mother. RN offered to further assess the home for fall prevention if the home can be made safer to assist pt with hospitalization. Also offered meal services due to pt's limited ability to work and stand to [employer]. Other services requested for in home aide services. RN stress this services my incur a fee based upon pt's household income however RN can make a referral to [facility] social worker to intervene with avialable resources in the community for in home aide and meal services (daughter receptive). Will also offered a home visit to further assist pt with his needs. Discussed a plan of care and scheduled a home visit for next week to further assess possible needs. Will also communicate with pt's provider on pt's disposition with [facility] services.   [facility] CM Care Plan Problem One     Most Recent Value  Care Plan Problem One  Fall prevention  Role Documenting the Problem One  Care Management Coordinator  Care Plan for Problem One  Active  [facility] Long Term Goal   Pt will not have any falls or injuries over the next 90 day.  [facility]N Long Term Goal Start Date  12/19/18  Interventions for Problem One Long Term Goal  Pt wili  use his assistive device to prevent future falls and injuries. Will stress the importances of using these devices to prevent acute injuries and possibly a hospitalizations.  [facility] CM Short Term Goal #1   Will be preceptive to social work consult for in-home aide services over the next 3 weeks.  [facility] CM Short Term Goal #1 Start Date  12/19/18  Interventions for Short Term Goal #1  Will discuss available services that maybe able to assist with in home aide services.  [facility] CM Short Term Goal #2   Assistance for nutritional meals via referral services over the next 3 weeks with [facility] social worker.  [facility] CM Short Term Goal #2 Start Date  12/19/18  Interventions for Short Term Goal #2  Will discussed healthy eating habits and offer referral for meal services for in home deliver if eligible. Will stress the importance of eating properly in order to take pt's medication withou GI upset and other side effects that can occur with bad eating habits such as skipping meals as informed on today's assessment.     Raina Mina, RN Care Management Coordinator [place] Office [phone]

## 2018-12-19 NOTE — Telephone Encounter (Signed)
Gave avs and calendar ° °

## 2018-12-22 ENCOUNTER — Telehealth: Payer: Self-pay | Admitting: Cardiology

## 2018-12-22 NOTE — Telephone Encounter (Signed)
New Message    Patient's sister calling to find out if brother still needs appointment since he already had his procedure done.

## 2018-12-22 NOTE — Telephone Encounter (Signed)
I spoke to the patient's daughter Britt Boozer) who wanted to know if the patient needed to keep his appt with Dr Radford Pax 2/27 @ 10:20.  If he has to keep it, they cannot make it at that time.  They need a later time in the day or need to change the day.  Please call her 2/25

## 2018-12-23 NOTE — Telephone Encounter (Signed)
Spoke with the daughter, she reschedule on 01/01/19, for a 2 weeks follow up post-cath. She had no further questions.

## 2018-12-23 NOTE — Telephone Encounter (Signed)
Daughter returning call

## 2018-12-25 ENCOUNTER — Ambulatory Visit: Payer: Medicare Other | Admitting: Cardiology

## 2018-12-25 ENCOUNTER — Other Ambulatory Visit: Payer: Self-pay | Admitting: *Deleted

## 2018-12-25 NOTE — Patient Outreach (Signed)
[facility]) Care Management   12/25/2018  [name] 09-Nov-1945 [id number]  [name] is an 73 y.o. male  Subjective:  Falls/Safety - Pt states recent falls with some skin tears that he has placed a bandage over since last Sunday. Caregiver also reports some existing bruising from a recent provider's visit (lab draws). Reports a recent port cath in place now. Pt states he uses his rolling walker at all times now but on occassions has falls with the wrong twist or balance with his step. Bathroom to small for walker so he uses his cane but no falls reported in the bathroom.  Community Resources- Caregiver requested assistance in the home services to lower the risk of falls for pt. Receptive to social work consult via [facility].  Nutrition-Discussed possible mobile meals also pending consult with [facility] social work.    Objective:   Review of Systems  Constitutional: Negative.   HENT: Negative.   Eyes: Negative.   Respiratory: Negative.   Cardiovascular: Negative.   Gastrointestinal: Negative.   Genitourinary: Negative.   Musculoskeletal: Negative.   Skin:       Left elbow with a bandage due to a skin tear from a recent fall.   Neurological: Negative.   Endo/Heme/Allergies: Negative.   Psychiatric/Behavioral: Negative.     Physical Exam  Constitutional: He is oriented to person, place, and time. He appears well-developed and well-nourished.  HENT:  Right Ear: External ear normal.  Left Ear: External ear normal.  Eyes: EOM are normal.  Neck: Normal range of motion.  Cardiovascular: Normal heart sounds.  Respiratory: Effort normal and breath sounds normal.  GI: Soft. Bowel sounds are normal.  Musculoskeletal: Normal range of motion.     Comments: Limited ROM and mobility but pt able to ambulate with use of his rollator.  Neurological: He is alert and oriented to person, place, and time.  Skin: Skin is warm and dry.  Psychiatric: He has a normal mood and  affect. His behavior is normal. Judgment and thought content normal.    Encounter Medications:   Outpatient Encounter Medications as of 12/25/2018  Medication Sig  . acetaminophen (TYLENOL) 500 MG tablet Take 1,000 mg by mouth every 6 (six) hours as needed for moderate pain.  Marland Kitchen atorvastatin (LIPITOR) 80 MG tablet TAKE 1 TABLET BY MOUTH ONCE DAILY (Patient taking differently: Take 80 mg by mouth daily. )  . baclofen (LIORESAL) 10 MG tablet Take 5-10 mg by mouth 3 (three) times daily as needed for muscle spasms.   . beta carotene w/minerals (OCUVITE) tablet Take 1 tablet by mouth daily.  . cetirizine (ZYRTEC) 10 MG tablet Take 10 mg by mouth every evening.   . citalopram (CELEXA) 20 MG tablet TAKE 1 TABLET BY MOUTH ONCE DAILY (Patient taking differently: Take 20 mg by mouth daily. )  . clonazePAM (KLONOPIN) 1 MG tablet Take 1 tablet (1 mg total) by mouth 2 (two) times daily as needed. for anxiety (Patient taking differently: Take 1 mg by mouth 2 (two) times daily as needed for anxiety. )  . clotrimazole-betamethasone (LOTRISONE) cream APPLY  CREAM TOPICALLY TO AFFECTED AREA TWICE DAILY (Patient taking differently: Apply 1 application topically 2 (two) times daily as needed (jock itch). )  . ezetimibe (ZETIA) 10 MG tablet Take 10 mg by mouth at bedtime.  . furosemide (LASIX) 20 MG tablet TAKE 1 TABLET BY MOUTH ONCE DAILY (Patient taking differently: Take 20 mg by mouth daily. )  . gabapentin (NEURONTIN) 300 MG  capsule TAKE 1 CAPSULE BY MOUTH THREE TIMES DAILY (Patient taking differently: Take 300 mg by mouth 3 (three) times daily. )  . HUMULIN N KWIKPEN 100 UNIT/ML Kiwkpen INJECT 16 UNITS SUBCUTANEOUSLY IN THE MORNING AND 16 IN THE EVENING (Patient taking differently: Inject 16 Units into the skin 2 (two) times daily. )  . hydrOXYzine (ATARAX/VISTARIL) 25 MG tablet TAKE ONE TABLET BY MOUTH AT SUPPER AND 1 TABLET AT BEDTIME FOR INSOMNIA (Patient taking differently: Take 25 mg by mouth 2 (two) times  daily as needed (sleep). Take at supper and night when needed)  . Insulin Lispro (HUMALOG KWIKPEN) 200 UNIT/ML SOPN Inject 20 Units into the skin 2 (two) times daily.  . magnesium chloride (SLOW-MAG) 64 MG TBEC SR tablet Take 1 tablet (64 mg total) by mouth daily.  . metFORMIN (GLUCOPHAGE) 1000 MG tablet TAKE 1 TABLET BY MOUTH TWICE DAILY WITH MEALS (Patient taking differently: Take 1,000 mg by mouth 2 (two) times daily with a meal. )  . Omega-3 Fatty Acids (FISH OIL) 1200 MG CAPS Take 2,400 mg by mouth 2 (two) times daily.  . ONE TOUCH ULTRA TEST test strip USE 1 STRIP TO CHECK GLUCOSE THREE TIMES DAILY AS DIRECTED  . ONETOUCH DELICA LANCETS 60F MISC USE   TO CHECK GLUCOSE THREE TIMES DAILY  . OXYGEN Inhale 2 L into the lungs continuous.   . pantoprazole (PROTONIX) 40 MG tablet TAKE 1 TABLET BY MOUTH TWICE DAILY (Patient taking differently: Take 40 mg by mouth 2 (two) times daily. )   No facility-administered encounter medications on file as of 12/25/2018.     Functional Status:   In your present state of health, do you have any difficulty performing the following activities: 12/19/2018 03/18/2018  Hearing? [place]? N -  Difficulty concentrating or making decisions? N -  Walking or climbing stairs? N -  Dressing or bathing? N -  Doing errands, shopping? [place]  Preparing Food and eating ? Y -  Using the Toilet? N -  In the past six months, have you accidently leaked urine? N -  Do you have problems with loss of bowel control? N -  Managing your Medications? Y -  Managing your Finances? Y -  Housekeeping or managing your Housekeeping? Y -  Some recent data might be hidden    Fall/Depression Screening:    Fall Risk  12/19/2018 07/25/2018 07/26/2017  Falls in the past year? 1 Yes Yes  Number falls in past yr: 1 2 or more 2 or more  Injury with Fall? 0 No No  Risk Factor Category  - High Fall Risk -  Risk for fall due to : Impaired balance/gait - Impaired balance/gait   Follow up Falls prevention discussed Education provided Falls prevention discussed  Comment - - -   PHQ 2/9 Scores 12/19/2018 07/25/2018 07/26/2017 04/25/2017 07/10/2016 04/11/2015 09/30/2014  PHQ - 2 Score 0 0 0 0 1 0 0  PHQ- 9 Score - - - 3 - - -  BP 102/62 (BP Location: Left Arm, Patient Position: Sitting, Cuff Size: Normal)   Pulse 87   Resp 20   Ht 1.727 m (5\' 8" )   Wt 268 lb (121.6 kg)   SpO2 94%   BMI 40.75 kg/m    Assessment:   Preventive measures related to fall prevention Consult for [facility] social worker related to mobile meals and other community resources.  Plan:  Will review prevention measures to prevent falls and  possible risk of hospitalization. Will discuss technique upon arising from a sitting position for pt to pause prior to ambulate to prevent possible dizziness. Will verify use of assisted device at all times to prevent future falls and injuries. Will strongly encouraged pt to avoid maneuvers that would put him at risk for falls such as twisting, bending and to wear the appropriate shoes with a good grip on the sole. Will strongly encourage caregiver /pt to be receptive to the pending consult for mobile meals and other community resources that maybe able to assist with lowering the risk for falls. Will discuss the plan of care and strongly encourage adherence with the goals and interventions discussed today. Will follow up next month with pt's progress and re-evaluate pt's progress accordingly. Will also send today's report to primary provider of [facility] involvement. No other inquires or request at this time.   [facility]N CM Care Plan Problem One     Most Recent Value  Care Plan Problem One  Fall prevention  Role Documenting the Problem One  Care Management Coordinator  Care Plan for Problem One  Active  [facility]N Long Term Goal   Pt will not have any falls or injuries over the next 90 day.  THN Long Term Goal Start Date  12/19/18  Interventions for Problem One Long Term Goal  Will  stress the improtance of safety and fall prevention to avoid ED or hospitalization visits.  THN CM Short Term Goal #1   Will be preceptive to social work consult for in-home aide services over the next 3 weeks.  [facility]N CM Short Term Goal #1 Start Date  12/19/18  Interventions for Short Term Goal #1  Will continue to stress the importance of accepting the consult for other community resources.  THN CM Short Term Goal #2   Assistance for nutritional meals via referral services over the next 3 weeks with [facility] social worker.  [facility]N CM Short Term Goal #2 Start Date  12/19/18  Interventions for Short Term Goal #2  Will stress the importance once again of community resources for meal services. Will encourage pt to be receptive to the available services.       Raina Mina, RN Care Management Coordinator [place] Office [phone]

## 2018-12-28 DIAGNOSIS — B029 Zoster without complications: Secondary | ICD-10-CM

## 2018-12-28 HISTORY — DX: Zoster without complications: B02.9

## 2018-12-28 HISTORY — PX: THORACENTESIS: SHX235

## 2019-01-01 ENCOUNTER — Encounter: Payer: Self-pay | Admitting: Physician Assistant

## 2019-01-01 ENCOUNTER — Ambulatory Visit: Payer: Medicare Other | Admitting: Physician Assistant

## 2019-01-01 ENCOUNTER — Telehealth: Payer: Self-pay | Admitting: *Deleted

## 2019-01-01 ENCOUNTER — Encounter (INDEPENDENT_AMBULATORY_CARE_PROVIDER_SITE_OTHER): Payer: Self-pay

## 2019-01-01 VITALS — BP 119/68 | HR 85 | Ht 68.0 in | Wt 269.1 lb

## 2019-01-01 DIAGNOSIS — I255 Ischemic cardiomyopathy: Secondary | ICD-10-CM | POA: Diagnosis not present

## 2019-01-01 DIAGNOSIS — K769 Liver disease, unspecified: Secondary | ICD-10-CM

## 2019-01-01 DIAGNOSIS — I2510 Atherosclerotic heart disease of native coronary artery without angina pectoris: Secondary | ICD-10-CM

## 2019-01-01 DIAGNOSIS — J918 Pleural effusion in other conditions classified elsewhere: Secondary | ICD-10-CM

## 2019-01-01 DIAGNOSIS — I10 Essential (primary) hypertension: Secondary | ICD-10-CM | POA: Diagnosis not present

## 2019-01-01 DIAGNOSIS — R0602 Shortness of breath: Secondary | ICD-10-CM

## 2019-01-01 MED ORDER — METOPROLOL TARTRATE 25 MG PO TABS: 12.5000 mg | ORAL_TABLET | Freq: Two times a day (BID) | ORAL | 1 refills | Status: DC

## 2019-01-01 MED ORDER — METOPROLOL SUCCINATE ER 25 MG PO TB24: 12.5000 mg | ORAL_TABLET | Freq: Two times a day (BID) | ORAL | 1 refills | Status: DC

## 2019-01-01 NOTE — Telephone Encounter (Signed)
SPOKE WITH DTR TO CLARIFY PT WILL BE TAKING METOPROLOL TARTRATE 12.5 MG TWICE A DAY NOT  TOPROL XL THAT SAY ON THEIR AVS.  PHARMACY CONTACTED ALSO.

## 2019-01-01 NOTE — Patient Instructions (Addendum)
Medication Instructions:   START TAKING METOPROLOL  TARTRATE12.5 MG TWICE DAY   If you need a refill on your cardiac medications before your next appointment, please call your pharmacy.   Lab work: NONE ORDERED  TODAY   If you have labs (blood work) drawn today and your tests are completely normal, you will receive your results only by: Marland Kitchen MyChart Message (if you have MyChart) OR . A paper copy in the mail If you have any lab test that is abnormal or we need to change your treatment, we will call you to review the results.  Testing/Procedures: NONE ORDERED  TODAY   Follow-Up: At [facility], you and your health needs are our priority.  As part of our continuing mission to provide you with exceptional heart care, we have created designated Provider Care Teams.  These Care Teams include your primary Cardiologist (physician) and Advanced Practice Providers (APPs -  Physician Assistants and Nurse Practitioners) who all work together to provide you with the care you need, when you need it. You will need a follow up appointment in 2 months.  You may see Fransico Him, MD or one of the following Advanced Practice Providers on your designated Care Team:   [place], PA-C Melina Copa, PA-C . Ermalinda Barrios, PA-C  Any Other Special Instructions Will Be Listed Below (If Applicable).

## 2019-01-01 NOTE — Progress Notes (Signed)
Cardiology Office Note    Date:  01/01/2019   ID:  DELFIN SQUILLACE, DOB Sep 30, 1946, MRN [id number]  PCP:  Tammi Sou, MD  Cardiologist:  Dr. Radford Pax  Chief Complaint: Cath follow up  History of Present Illness:   [name] is a 73 y.o. 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   Cath in 2014 with severely diseased and chronically occluded LAD and diagonal with right to left collaterals, mild disease in the left circumflex and moderate disease in the mid RCA treated medically with Plavix and Imdur.   He was off aspirin due to GE junction cancer. Noted DOE when seen by Dr. Radford Pax 11/20/18. 2D echo 11/25/18 showed a decline in heart function EF 40 to 45% with severe hypokinesis of the apical, anterior septal, anterior and apical left ventricular segments. Noted elevated D-dimer during follow up 12/10/18. Send to ER>> no PE by CTA of chest.   Follow up R & L cath as below 12/17/18:   Prox LAD lesion is 80% stenosed with 99% stenosed side branch in Ost 1st Diag.  Prox LAD to Mid LAD lesion is 100% stenosed (CTO with bridging collaterals -- Mid LAD to Dist LAD lesion is 90% stenosed.)  Prox Cx lesion is 40% stenosed.  Right Heart Cath (RAP-RVP-PA P-PCWP) and LV EDP are all relatively normal   SUMMARY  Essentially stable severe LAD diagonal disease with minimal change from last procedure -not PCI amenable  Relatively normal Right Heart Cath Pressures with Wedge Pressure of 11, LV EDP of 14. --PA pressures are not consistent with pulmonary hypertension  RECOMMENDATIONS  Appears euvolemic on exam, is euvolemic on right heart cath.  No active CHF symptoms.  Likely cause for worsening anterior wall motion is simply progression of disease.  His LAD has been occluded since 2014.  This is not an  unexpected wall motion normality.  I doubt that this is viable PCI target.  Here today for follow up with daughter and granddaughter.  Had a lot of question.  All answered.  Patient has chronic dyspnea without exacerbation.  He was very upset that he needed to go to ER by ambulance for CT of the chest.  He has pending evaluation by Dr. Lake Bells next week.  Compliant with medication.  Past Medical History:  Diagnosis Date  . Acute upper GI bleed 02/2018   Transfused 4 U pRBCs-->EGD showed an esoph ulcer and some gastric ulcers (?radiation-induced), path showed no malignancy or Barrett's.  Rpt EGD 04/28/18: Grade I and small (< 5 mm) distal esophageal varices.  Esoph ulcer healed.  Mild portal hypert gastrop.  No sign of malignancy.  . Asthma    as a child  . CAD in native artery    a. cardiac cath 09/2013 showed severely diseased mLAD and diagonal, mild LCx disease, moderate RCA disease, LVEDP 20, EF 50%. 11/2018 cath->progression of LAD dz, not amenable to PCI  . Cataract    multiple types, bilateral  . Cholelithiasis without cholecystitis   . Chronic diastolic CHF (congestive heart failure) ([place]) 02/25/2009   Dr. Radford Pax to get echo and referred pt to pulm as of 11/20/2018 due to worsening SOB and orthopnea (BNP normal).  . Chronic renal insufficiency, stage III (moderate) (HCC) 2017   Stage II/III (GFR around 60)  . 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  CT 11/2017 w/ascites and persistent bilat pleural effusions  . 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 COPD (chronic obstructive pulmonary disease) ([place])   . Coronary artery disease    chronically occluded LAD and diagonal with right to left collaterals, mild disease in the left circ and moderate disease in the mid RCA on medical management with Imdur, ASA, and Plavix.  . Dermatitis 05/2014  . DIABETES MELLITUS, TYPE II 06/24/2007   No diab retpthy as of 08/05/15 eye  exam.  . Esophageal cancer ([place]) 04/2016   poorly differentiated carcinoma (Dr. Pyrtle--EGD).  CT C/A/P showed metastatic adenopathy in mediastinum and upper abdomen 05/09/16.  Tx plan is palliative radiation (completed 06/22/16), then palliative systemic chemotherapy (carbo+taxol) was started but as of 08/03/16 onc f/u this was held due to severe knee and ankle arthralgias.  Restarting as of 10/2015 (08/2016 CT showed some disease regression  . Esophageal cancer ([place])    CT 12/2016 showed no residual esoph mass--plan to continue chemo.  Ongoing palliative chemo with carboplatin and taxol q 2 wks as of 03/2017 onc f/u (CT 12/2016 and 04/2017 showed no progression of dz).  Taking a 2 mo break from chemo as of 05/2017.  Pleural effusion-- improved with lasix but onc wanted him to get dx/therap thoracentesis-pt declined.  CT C/A/P no progression/recurr/mets 11/2017.  Marland Kitchen Esophageal cancer ([place])    06/16/18 surveillance CTs showed no evidence of dz recurrence.  . Essential hypertension 05/01/2007   Qualifier: Diagnosis of  By: Tiney Rouge CMA, Ellison Hughs     . History of kidney stones   . Hyperkalemia 12/2015   Decreased ACE-I by 50% in response, then potassium normalized.  Marland Kitchen HYPERLIPIDEMIA 06/24/2007   03/2018 lipids at goal  . Hypoxemia 01/27/2010  . Morbid obesity ([place])   . Myocardial infarction [facility])    pt states he was informed per MD that he has had one but pt was unaware   . Obesity hypoventilation syndrome (HCC)    oxygen 24/7 (2 liters [place] as of 02/2016)  . On home oxygen therapy    Oxygen @ 2l/m nasally 24/7 hours  . OSA (obstructive sleep apnea)    not tested; pt scored 4 per stop bang tool results sent to PCP   . OSTEOARTHRITIS 05/01/2007  . Pancytopenia due to chemotherapy ([place]) 2018  . Pleural effusion on right 08/2017   ? malignant vs CHF: pt refuses diagnostic thoracentesis b/c he states he feels fine, wants to try diuretic first.  . Pleural effusion on right 09/2017   Thoracentesis  . Presbycusis of  both ears 05/2015   [facility]  . Pruritic condition 05/2014   Allergist summer 2015, no new testing.  Marland Kitchen RBBB   . Visual field defect 05/10/2016   Per Dr. Melina Fiddler, O.D.: Rt, loss of inf/temp quad and some loss sup/temp.  ?CVA  ? Pituitary tumor? 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    Past Surgical History:  Procedure Laterality Date  . APPENDECTOMY    . BALLOON DILATION N/A 05/04/2016   Procedure: BALLOON DILATION;  Surgeon: Jerene Bears, MD;  Location: [facility];  Service: Gastroenterology;  Laterality: N/A;  . BIOPSY  03/19/2018   Procedure: BIOPSY;  Surgeon: Jackquline Denmark, MD;  Location: [facility];  Service: Endoscopy;;  . CARDIAC CATHETERIZATION    . CARPAL TUNNEL RELEASE Left   . CATARACT  EXTRACTION W/PHACO Right 04/29/2013   Procedure: CATARACT EXTRACTION PHACO AND INTRAOCULAR LENS PLACEMENT (IOC);  Surgeon: Adonis Brook, MD;  Location: [place];  Service: Ophthalmology;  Laterality: Right;  . CATARACT EXTRACTION, BILATERAL    . COLONOSCOPY W/ POLYPECTOMY  08/2011   Many polyps--all hyperplastic, severe diverticulosis, int hem.  [employer] hemoccult testing via [facility] 06/22/15 NEG  . ESOPHAGOGASTRODUODENOSCOPY N/A 05/04/2016   Procedure: ESOPHAGOGASTRODUODENOSCOPY (EGD);  Surgeon: Jerene Bears, MD;  Location: [facility];  Service: Gastroenterology;  Laterality: N/A;  . ESOPHAGOGASTRODUODENOSCOPY (EGD) WITH PROPOFOL N/A 03/19/2018   Gastric ulcers, esoph ulcer (radiation-induced?), bx-->path neg for malignancy or Barrett's.  GI plans repeat EGD 6-8 wks after 1st.   Procedure: ESOPHAGOGASTRODUODENOSCOPY (EGD) WITH PROPOFOL;  Surgeon: Jackquline Denmark, MD;  [facility];  Service: Endoscopy;  Laterality: N/A;  . ESOPHAGOGASTRODUODENOSCOPY (EGD) WITH PROPOFOL N/A 04/28/2018   Esoph ulcer healed.  Grade I and small (< 5 mm) distal esophageal varices.  Mild portal hypertensive gastropathy.  No evidence of malignance.  Procedure:  ESOPHAGOGASTRODUODENOSCOPY (EGD) WITH PROPOFOL;  Surgeon: Jerene Bears, MD;  [facility];  Service: Gastroenterology;  Laterality: N/A;  . IR GENERIC HISTORICAL  10/03/2016   IR US GUIDE VASC ACCESS RIGHT 10/03/2016 Greggory Keen, MD [facility]  . IR GENERIC HISTORICAL  10/03/2016   IR FLUORO GUIDE PORT INSERTION RIGHT 10/03/2016 Greggory Keen, MD [facility]  . KNEE ARTHROSCOPY WITH MEDIAL MENISECTOMY Right 12/16/2014   Procedure: RIGHT KNEE ARTHROSCOPY WITH MEDIAL MENISECTOMY microfracture medial femoral condyle abrasion condroplasty medial femoral condyle lateral menisectomy;  Surgeon: Tobi Bastos, MD;  [facility];  Service: Orthopedics;  Laterality: Right;  . LEFT HEART CATHETERIZATION WITH CORONARY ANGIOGRAM N/A 10/26/2013   Procedure: LEFT HEART CATHETERIZATION WITH CORONARY ANGIOGRAM;  Surgeon: Jettie Booze, MD;  Location: [facility] CATH LAB;  Service: Cardiovascular;  Laterality: N/A;  . LUMBAR [place] SURGERY  2001  . RIGHT/LEFT HEART CATH AND CORONARY ANGIOGRAPHY N/A 12/17/2018   Progression of LAD dz, not amenable to PCI. R heart cath with normal pressures.  Procedure: RIGHT/LEFT HEART CATH AND CORONARY ANGIOGRAPHY;  Surgeon: Leonie Man, MD;  Location: [place] CV LAB;  Service: Cardiovascular;  Laterality: N/A;  . SHOULDER SURGERY Right    right fx  . TONSILLECTOMY    . TRANSTHORACIC ECHOCARDIOGRAM  03/2016; 11/25/2018   2017: EF 55-60%, grade I DD, LAE.  Jan 2020->new anteroapical akinesis, EF 40-45%-->cath 11/2018 showed no acute changes (progression of LAD dz->this explains the [facility] seen on recent echo.  . WRIST SURGERY Right    right fx    Current Medications: Prior to Admission medications   Medication Sig Start Date End Date Taking? Authorizing Provider  acetaminophen (TYLENOL) 500 MG tablet Take 1,000 mg by mouth every 6 (six) hours as needed for moderate pain.    [provider]  atorvastatin (LIPITOR) 80 MG tablet TAKE 1 TABLET BY MOUTH  ONCE DAILY Patient taking differently: Take 80 mg by mouth daily.  10/07/18   Sueanne Margarita, MD  baclofen (LIORESAL) 10 MG tablet Take 5-10 mg by mouth 3 (three) times daily as needed for muscle spasms.     [provider]  beta carotene w/minerals (OCUVITE) tablet Take 1 tablet by mouth daily.    [provider]  cetirizine (ZYRTEC) 10 MG tablet Take 10 mg by mouth every evening.     [provider]  citalopram (CELEXA) 20 MG tablet TAKE 1 TABLET BY MOUTH ONCE DAILY Patient taking  differently: Take 20 mg by mouth daily.  10/06/18   McGowen, Adrian Blackwater, MD  clonazePAM (KLONOPIN) 1 MG tablet Take 1 tablet (1 mg total) by mouth 2 (two) times daily as needed. for anxiety Patient taking differently: Take 1 mg by mouth 2 (two) times daily as needed for anxiety.  07/25/18   McGowen, Adrian Blackwater, MD  clotrimazole-betamethasone (LOTRISONE) cream APPLY  CREAM TOPICALLY TO AFFECTED AREA TWICE DAILY Patient taking differently: Apply 1 application topically 2 (two) times daily as needed (jock itch).  12/05/18   McGowen, Adrian Blackwater, MD  ezetimibe (ZETIA) 10 MG tablet Take 10 mg by mouth at bedtime.    [provider]  furosemide (LASIX) 20 MG tablet TAKE 1 TABLET BY MOUTH ONCE DAILY Patient taking differently: Take 20 mg by mouth daily.  07/11/18   McGowen, Adrian Blackwater, MD  gabapentin (NEURONTIN) 300 MG capsule TAKE 1 CAPSULE BY MOUTH THREE TIMES DAILY Patient taking differently: Take 300 mg by mouth 3 (three) times daily.  12/02/18   McGowen, Adrian Blackwater, MD  HUMULIN N KWIKPEN 100 UNIT/ML Kiwkpen INJECT 16 UNITS SUBCUTANEOUSLY IN THE MORNING AND 16 IN THE EVENING Patient taking differently: Inject 16 Units into the skin 2 (two) times daily.  08/19/18   McGowen, Adrian Blackwater, MD  hydrOXYzine (ATARAX/VISTARIL) 25 MG tablet TAKE ONE TABLET BY MOUTH AT SUPPER AND 1 TABLET AT BEDTIME FOR INSOMNIA Patient taking differently: Take 25 mg by mouth 2 (two) times daily as needed (sleep). Take at supper and  night when needed 10/06/18   McGowen, Adrian Blackwater, MD  Insulin Lispro (HUMALOG KWIKPEN) 200 UNIT/ML SOPN Inject 20 Units into the skin 2 (two) times daily. 05/09/18   McGowen, Adrian Blackwater, MD  magnesium chloride (SLOW-MAG) 64 MG TBEC SR tablet Take 1 tablet (64 mg total) by mouth daily. 09/01/16   Eber Jones, MD  metFORMIN (GLUCOPHAGE) 1000 MG tablet TAKE 1 TABLET BY MOUTH TWICE DAILY WITH MEALS Patient taking differently: Take 1,000 mg by mouth 2 (two) times daily with a meal.  09/30/18   McGowen, Adrian Blackwater, MD  Omega-3 Fatty Acids (FISH OIL) 1200 MG CAPS Take 2,400 mg by mouth 2 (two) times daily.    [provider]  ONE TOUCH ULTRA TEST test strip USE 1 STRIP TO CHECK GLUCOSE THREE TIMES DAILY AS DIRECTED 06/02/18   McGowen, Adrian Blackwater, MD  [facility] DELICA LANCETS 75O MISC USE   TO CHECK GLUCOSE THREE TIMES DAILY 06/24/18   McGowen, Adrian Blackwater, MD  OXYGEN Inhale 2 L into the lungs continuous.     [provider]  pantoprazole (PROTONIX) 40 MG tablet TAKE 1 TABLET BY MOUTH TWICE DAILY Patient taking differently: Take 40 mg by mouth 2 (two) times daily.  09/30/18   McGowen, Adrian Blackwater, MD    Allergies:   Patient has no known allergies.   Social History   Socioeconomic History  . Marital status: Married    Spouse name: susan  . Number of children: 2  . Years of education: Not on file  . Highest education level: Not on file  Occupational History  . Occupation: Retired  Scientific laboratory technician  . Financial resource strain: Not on file  . Food insecurity:    Worry: Not on file    Inability: Not on file  . Transportation needs:    Medical: Not on file    Non-medical: Not on file  Tobacco Use  . Smoking status: Former Smoker    Packs/day: 1.50  Years: 30.00    Pack years: 45.00    Types: Cigarettes, Pipe, Cigars    Last attempt to quit: 10/30/1983    Years since quitting: 35.1  . Smokeless tobacco: Current User    Types: Chew    Last attempt to quit: 05/15/2016  Substance and  Sexual Activity  . Alcohol use: No  . Drug use: No  . Sexual activity: Not Currently  Lifestyle  . Physical activity:    Days per week: Not on file    Minutes per session: Not on file  . Stress: Not on file  Relationships  . Social connections:    Talks on phone: Not on file    Gets together: Not on file    Attends religious service: Not on file    Active member of club or organization: Not on file    Attends meetings of clubs or organizations: Not on file    Relationship status: Not on file  Other Topics Concern  . Not on file  Social History Narrative   Married, one son and one daughter.   His daughter and her two children live with him.   Coffee daily.  Former smoker.  No alcohol.   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   Attends church weekly-   Oxygen continuous     Family History:  The patient's family history includes Alcohol abuse in his father; Aneurysm in his father; Diabetes in his maternal aunt; Heart attack in his mother.   ROS:   Please see the history of present illness.    ROS All other systems reviewed and are negative.   PHYSICAL EXAM:   VS:  BP 119/68   Pulse 85   Ht '5\' 8"'  (1.727 m)   Wt 269 lb 1.9 oz (122.1 kg)   BMI 40.92 kg/m    GEN: Well nourished, well developed, in no acute distress  HEENT: normal  Neck: no JVD, carotid bruits, or masses Cardiac:RRR; no murmurs, rubs, or gallops,no edema  Respiratory: Diminished breath sounds bilaterally on oxygen GI: soft, nontender, nondistended, + BS MS: no deformity or atrophy  Skin: warm and dry, no rash Neuro:  Alert and Oriented x 3, Strength and sensation are intact Psych: euthymic mood, full affect  Wt Readings from Last 3 Encounters:  01/01/19 269 lb 1.9 oz (122.1 kg)  12/25/18 268 lb (121.6 kg)  12/19/18 270 lb 3.2 oz (122.6 kg)      Studies/Labs Reviewed:   EKG:  EKG is not ordered today.    Recent Labs: 11/20/2018: NT-Pro BNP 90 12/19/2018: ALT 14; BUN  8; Creatinine 0.86; Hemoglobin 10.2; Platelets 160; Potassium 3.8; Sodium 141   Lipid Panel    Component Value Date/Time   CHOL 97 (L) 04/24/2018 0951   TRIG 144 04/24/2018 0951   TRIG 268 (HH) 09/06/2006 1555   HDL 36 (L) 04/24/2018 0951   CHOLHDL 2.7 04/24/2018 0951   CHOLHDL 3.6 03/12/2016 1001   VLDL 37 (H) 03/12/2016 1001   LDLCALC 32 04/24/2018 0951   LDLDIRECT 74.0 04/15/2015 0838    Additional studies/ records that were reviewed today include:   Echo 11/25/18 1. The left ventricle has mild-moderately reduced systolic function of 91-50%. The cavity size is normal. There is no increased left ventricular wall thickness. Echo evidence of impaired diastolic relaxation. Normal left ventricular filling pressures.  2. There is severe hypokinesis of the apical anteroseptal, anterior and apical left ventricular segments.  3. The right  ventricle has normal systolic function. The cavity in normal in size. There is no increase in right ventricular wall thickness. Right ventricular systolic pressure could not be assessed.  4. Mildly dilated left atrial size.  5. Mild mitral annular calcification.  6. Pulmonic valve regurgitation is mild.  7. The aortic root and ascending aorta are normal in size and structure.  8. No intracardiac thrombi or masses were visualized.  9. Compared to 2017, there is a new anteroapical wall motion abnormality consistent with interval infarction in the mid-distal LAD territory.  ASSESSMENT & PLAN:    1. CAD - Last cath 12/17/18 as above. Relatively stable disease - not PCI amenable.  Denies angina. - He is off aspirin due to GE junction cancer.  Advised to discuss with oncology if okay to restart aspirin 81 mg daily.  However, start after appointment with pulmonary if required thoracentesis.  Continue statin.  Add beta-blocker.   2. ICM/Chronic systolic CHF - 2D echo 4/82/50 showed a decline in heart function EF 40 to 45% with severe hypokinesis of the apical,  anterior septal, anterior and apical left ventricular segments.  - felt worsening anterior wall motion is simply progression of LAD disease since 2014.  -Denies lower extremity edema.  His chronic dyspnea is most likely due to underlying lung disease. -Add low-dose metoprolol 12.62m BID. .  If worsening dyspnea, he will give [place] call may try Bisoprolol. Up titrate BB as tolerated and add ACE/ARB during follow up.   3.HLD - 04/24/2018: Cholesterol, Total 97; HDL 36; LDL Calculated 32; Triglycerides 144  - Continue statin and Zetia  4. Large right pleural effusion and small left pleural effusion - Has appointment with pulmonary next week  Medication Adjustments/Labs and Tests Ordered: Current medicines are reviewed at length with the patient today.  Concerns regarding medicines are outlined above.  Medication changes, Labs and Tests ordered today are listed in the Patient Instructions below. Patient Instructions  Medication Instructions:   START TAKING METOPROLOL TARTRATE 12.5 MG TWICE DAY   If you need a refill on your cardiac medications before your next appointment, please call your pharmacy.   Lab work: NONE ORDERED  TODAY   If you have labs (blood work) drawn today and your tests are completely normal, you will receive your results only by: .Marland KitchenMyChart Message (if you have MyChart) OR . A paper copy in the mail If you have any lab test that is abnormal or we need to change your treatment, we will call you to review the results.  Testing/Procedures: NONE ORDERED  TODAY   Follow-Up: [facility] and your health needs are our priority.  As part of our continuing mission to provide you with exceptional heart care, we have created designated Provider Care Teams.  These Care Teams include your primary Cardiologist (physician) and Advanced Practice Providers (APPs -  Physician Assistants and Nurse Practitioners) who all work together to provide you with the care you need, when  you need it.  You will need a follow up appointment in 2 months.  You may see TFransico Him MD or one of the following Advanced Practice Providers on your designated Care Team:   BWillard PA-C DMelina Copa PA-C . MErmalinda Barrios PA-C  Any Other Special Instructions Will Be Listed Below (If Applicable).  SJarrett Soho PUtah 01/01/2019 3:27 PM    [facility] [address] [place]  [id number]: [phone] Fax: [phone]

## 2019-01-06 ENCOUNTER — Ambulatory Visit (INDEPENDENT_AMBULATORY_CARE_PROVIDER_SITE_OTHER): Payer: Medicare Other | Admitting: Pulmonary Disease

## 2019-01-06 ENCOUNTER — Encounter: Payer: Self-pay | Admitting: Pulmonary Disease

## 2019-01-06 ENCOUNTER — Other Ambulatory Visit: Payer: Self-pay | Admitting: Family Medicine

## 2019-01-06 VITALS — BP 118/68 | HR 68 | Ht 68.0 in | Wt 271.2 lb

## 2019-01-06 DIAGNOSIS — J449 Chronic obstructive pulmonary disease, unspecified: Secondary | ICD-10-CM | POA: Diagnosis not present

## 2019-01-06 DIAGNOSIS — J90 Pleural effusion, not elsewhere classified: Secondary | ICD-10-CM | POA: Diagnosis not present

## 2019-01-06 DIAGNOSIS — R0609 Other forms of dyspnea: Secondary | ICD-10-CM

## 2019-01-06 NOTE — Patient Instructions (Signed)
It was nice seeing you in clinic today  As discussed we will schedule for a thoracentesis which is a procedure which will take some of the fluid around your lung for analysis. This will ne done at [facility].   We will also do blood work today to further help [place] determine the origin of this fluid.

## 2019-01-06 NOTE — Progress Notes (Signed)
Synopsis: Referred in 12/2018 for dyspnea; he has a history of esophageal cancer, he previously was a heavy smoker.    Subjective:   PATIENT ID: [name] GENDER: male DOB: [birth year], MRN: [id number]   HPI Patient is a 73 year old male with a past medical history significant for anemia, hypertension, dyslipidemia, obesity, chronic lower extremity edema OHS on oxygen and a history of GE junction adenocarcinoma status post chemoradiation who presents today to discuss bilateral pleural effusions seen on chest CT and worsening shortness of breath.  Patient reports that he has noticed worsening shortness of breath the past 2 months.  He is currently on 2 L oxygen at all time.  Patient endorses cough associated with shortness of breath.  He was started on Lasix 20 mg by PCP for lower extremity edema as well as worsening pleural effusion seen on CT chest.  Due to history of adenocarcinoma at the GE junction worsening pleural effusion, there was concern for possible recurrence/metastasis of his cancer.  Patient was referred to pulmonology to discuss further management.  Patient was recently seen by cardiology had a heart cath which showed significant LAD stenosis was amenable to PCI.  Recent echo showed decreased EF 40-45% with some degree of diastolic dysfunction.  There is also severe hypokinesis of the apical anteroseptal segment of the heart.  Patient was a longtime smoker quit 30 years ago.  Patient is a smoker 10 pack a week for about 30 years.  Patient denies any weight loss, night sweats, or decreased appetite.   Chief Complaint  Patient presents with  . Consult    cardiologist referred for SOB and poss fluid on lung    [name] is here to see me for evaluation of dyspnea.    Past Medical History:  Diagnosis Date  . Acute upper GI bleed 02/2018   Transfused 4 U pRBCs-->EGD showed an esoph ulcer and some gastric ulcers (?radiation-induced), path showed no malignancy or Barrett's.   Rpt EGD 04/28/18: Grade I and small (< 5 mm) distal esophageal varices.  Esoph ulcer healed.  Mild portal hypert gastrop.  No sign of malignancy.  . Asthma    as a child  . CAD in native artery    a. cardiac cath 09/2013 showed severely diseased mLAD and diagonal, mild LCx disease, moderate RCA disease, LVEDP 20, EF 50%. 11/2018 cath->progression of LAD dz, not amenable to PCI  . Cataract    multiple types, bilateral  . Cholelithiasis without cholecystitis   . Chronic diastolic CHF (congestive heart failure) ([place]) 02/25/2009   Dr. Radford Pax to get echo and referred pt to pulm as of 11/20/2018 due to worsening SOB and orthopnea (BNP normal).  . Chronic renal insufficiency, stage III (moderate) (HCC) 2017   Stage II/III (GFR around 60)  . 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  CT 11/2017 w/ascites and persistent bilat pleural effusions  . 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 COPD (chronic obstructive pulmonary disease) ([place])   . Coronary artery disease    chronically occluded LAD and diagonal with right to left collaterals, mild disease in the left circ and moderate disease in the mid RCA on medical management with Imdur, ASA, and Plavix.  . Dermatitis 05/2014  . DIABETES MELLITUS, TYPE II 06/24/2007   No diab retpthy as of 08/05/15 eye exam.  . 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 Pyrtle--EGD).  CT C/A/P showed metastatic adenopathy in mediastinum and upper abdomen 05/09/16.  Tx plan is palliative radiation (completed 06/22/16), then palliative systemic chemotherapy (carbo+taxol) was started but as of 08/03/16 onc f/u this was held due to severe knee and ankle arthralgias.  Restarting as of 10/2015 (08/2016 CT showed some disease regression  . Esophageal cancer ([place])    CT 12/2016 showed no residual esoph mass--plan to continue chemo.  Ongoing palliative chemo with carboplatin and taxol q 2  wks as of 03/2017 onc f/u (CT 12/2016 and 04/2017 showed no progression of dz).  Taking a 2 mo break from chemo as of 05/2017.  Pleural effusion-- improved with lasix but onc wanted him to get dx/therap thoracentesis-pt declined.  CT C/A/P no progression/recurr/mets 11/2017.  Marland Kitchen Esophageal cancer ([place])    06/16/18 surveillance CTs showed no evidence of dz recurrence.  . Essential hypertension 05/01/2007   Qualifier: Diagnosis of  By: Tiney Rouge CMA, Ellison Hughs     . History of kidney stones   . Hyperkalemia 12/2015   Decreased ACE-I by 50% in response, then potassium normalized.  Marland Kitchen HYPERLIPIDEMIA 06/24/2007   03/2018 lipids at goal  . Hypoxemia 01/27/2010  . Morbid obesity ([place])   . Myocardial infarction [facility])    pt states he was informed per MD that he has had one but pt was unaware   . Obesity hypoventilation syndrome (HCC)    oxygen 24/7 (2 liters [place] as of 02/2016)  . On home oxygen therapy    Oxygen @ 2l/m nasally 24/7 hours  . OSA (obstructive sleep apnea)    not tested; pt scored 4 per stop bang tool results sent to PCP   . OSTEOARTHRITIS 05/01/2007  . Pancytopenia due to chemotherapy ([place]) 2018  . Pleural effusion on right 08/2017   ? malignant vs CHF: pt refuses diagnostic thoracentesis b/c he states he feels fine, wants to try diuretic first.  . Pleural effusion on right 09/2017   Thoracentesis  . Presbycusis of both ears 05/2015   [facility]  . Pruritic condition 05/2014   Allergist summer 2015, no new testing.  Marland Kitchen RBBB   . Visual field defect 05/10/2016   Per Dr. Melina Fiddler, O.D.: Rt, loss of inf/temp quad and some loss sup/temp.  ?CVA  ? Pituitary tumor? 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     Family History  Problem Relation Age of Onset  . Heart attack Mother   . Aneurysm Father   . Alcohol abuse Father   . Diabetes Maternal Aunt   . Colon cancer Neg Hx      Social History   Socioeconomic History  . Marital status: Married    Spouse name:  susan  . Number of children: 2  . Years of education: Not on file  . Highest education level: Not on file  Occupational History  . Occupation: Retired  Scientific laboratory technician  . Financial resource strain: Not on file  . Food insecurity:    Worry: Not on file    Inability: Not on file  . Transportation needs:    Medical: Not on file    Non-medical: Not on file  Tobacco Use  . Smoking status: Former Smoker    Packs/day: 1.50    Years: 30.00    Pack years: 45.00    Types: Cigarettes, Pipe, Cigars    Last attempt to quit: 10/30/1983    Years since quitting: 35.2  . Smokeless tobacco: Current User  Types: Sarina Ser    Last attempt to quit: 05/15/2016  Substance and Sexual Activity  . Alcohol use: No  . Drug use: No  . Sexual activity: Not Currently  Lifestyle  . Physical activity:    Days per week: Not on file    Minutes per session: Not on file  . Stress: Not on file  Relationships  . Social connections:    Talks on phone: Not on file    Gets together: Not on file    Attends religious service: Not on file    Active member of club or organization: Not on file    Attends meetings of clubs or organizations: Not on file    Relationship status: Not on file  . Intimate partner violence:    Fear of current or ex partner: Not on file    Emotionally abused: Not on file    Physically abused: Not on file    Forced sexual activity: Not on file  Other Topics Concern  . Not on file  Social History Narrative   Married, one son and one daughter.   His daughter and her two children live with him.   Coffee daily.  Former smoker.  No alcohol.   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   Attends church weekly-   Oxygen continuous     No Known Allergies   Outpatient Medications Prior to Visit  Medication Sig Dispense Refill  . acetaminophen (TYLENOL) 500 MG tablet Take 1,000 mg by mouth every 6 (six) hours as needed for moderate pain.    Marland Kitchen atorvastatin (LIPITOR) 80 MG  tablet TAKE 1 TABLET BY MOUTH ONCE DAILY (Patient taking differently: Take 80 mg by mouth daily. ) 90 tablet 1  . baclofen (LIORESAL) 10 MG tablet Take 5-10 mg by mouth 3 (three) times daily as needed for muscle spasms.     . beta carotene w/minerals (OCUVITE) tablet Take 1 tablet by mouth daily.    . cetirizine (ZYRTEC) 10 MG tablet Take 10 mg by mouth every evening.     . citalopram (CELEXA) 20 MG tablet TAKE 1 TABLET BY MOUTH ONCE DAILY (Patient taking differently: Take 20 mg by mouth daily. ) 90 tablet 0  . clonazePAM (KLONOPIN) 1 MG tablet Take 1 tablet (1 mg total) by mouth 2 (two) times daily as needed. for anxiety (Patient taking differently: Take 1 mg by mouth 2 (two) times daily as needed for anxiety. ) 60 tablet 5  . clotrimazole-betamethasone (LOTRISONE) cream APPLY  CREAM TOPICALLY TO AFFECTED AREA TWICE DAILY (Patient taking differently: Apply 1 application topically 2 (two) times daily as needed (jock itch). ) 45 g 0  . ezetimibe (ZETIA) 10 MG tablet Take 10 mg by mouth at bedtime.    . furosemide (LASIX) 20 MG tablet TAKE 1 TABLET BY MOUTH ONCE DAILY (Patient taking differently: Take 20 mg by mouth daily. ) 90 tablet 1  . gabapentin (NEURONTIN) 300 MG capsule TAKE 1 CAPSULE BY MOUTH THREE TIMES DAILY (Patient taking differently: Take 300 mg by mouth 3 (three) times daily. ) 270 capsule 3  . HUMULIN N KWIKPEN 100 UNIT/ML Kiwkpen INJECT 16 UNITS SUBCUTANEOUSLY IN THE MORNING AND 16 IN THE EVENING (Patient taking differently: Inject 16 Units into the skin 2 (two) times daily. ) 30 pen 1  . hydrOXYzine (ATARAX/VISTARIL) 25 MG tablet TAKE ONE TABLET BY MOUTH AT SUPPER AND 1 TABLET AT BEDTIME FOR INSOMNIA (Patient taking differently: Take 25 mg by  mouth 2 (two) times daily as needed (sleep). Take at supper and night when needed) 60 tablet 6  . Insulin Lispro (HUMALOG KWIKPEN) 200 UNIT/ML SOPN Inject 20 Units into the skin 2 (two) times daily. 18 pen 1  . magnesium chloride (SLOW-MAG) 64 MG  TBEC SR tablet Take 1 tablet (64 mg total) by mouth daily. 60 tablet 0  . metFORMIN (GLUCOPHAGE) 1000 MG tablet TAKE 1 TABLET BY MOUTH TWICE DAILY WITH MEALS (Patient taking differently: Take 1,000 mg by mouth 2 (two) times daily with a meal. ) 180 tablet 1  . metoprolol tartrate (LOPRESSOR) 25 MG tablet Take 0.5 tablets (12.5 mg total) by mouth 2 (two) times daily. 90 tablet 1  . Omega-3 Fatty Acids (FISH OIL) 1200 MG CAPS Take 2,400 mg by mouth 2 (two) times daily.    . ONE TOUCH ULTRA TEST test strip USE 1 STRIP TO CHECK GLUCOSE THREE TIMES DAILY AS DIRECTED 100 each 11  . ONETOUCH DELICA LANCETS 67Y MISC USE   TO CHECK GLUCOSE THREE TIMES DAILY 100 each 11  . OXYGEN Inhale 2 L into the lungs continuous.     . pantoprazole (PROTONIX) 40 MG tablet TAKE 1 TABLET BY MOUTH TWICE DAILY (Patient taking differently: Take 40 mg by mouth 2 (two) times daily. ) 180 tablet 2   No facility-administered medications prior to visit.     Review of Systems  HENT: Negative.   Respiratory: Positive for cough and shortness of breath.   Cardiovascular: Negative.   Genitourinary: Negative.   Musculoskeletal: Negative.   Skin: Negative.       Objective:  Physical Exam Constitutional:      Appearance: Normal appearance. He is obese.  HENT:     Head: Normocephalic and atraumatic.     Nose: Nose normal.     Mouth/Throat:     Mouth: Mucous membranes are moist.     Pharynx: Oropharynx is clear.  Neck:     Musculoskeletal: Normal range of motion.  Cardiovascular:     Rate and Rhythm: Normal rate and regular rhythm.  Pulmonary:     Effort: Pulmonary effort is normal.     Breath sounds: Wheezing present.     Comments: Mildly diminished at the bases Abdominal:     General: Bowel sounds are normal.     Palpations: Abdomen is soft.  Musculoskeletal: Normal range of motion.        General: No tenderness.  Skin:    Capillary Refill: Capillary refill takes less than 2 seconds.  Neurological:      General: No focal deficit present.     Mental Status: He is alert.      Vitals:   01/06/19 1618  BP: 118/68  Pulse: 68  SpO2: 97%  Weight: 271 lb 3.2 oz (123 kg)  Height: '5\' 8"'$  (1.727 m)    CBC    Component Value Date/Time   WBC 7.0 12/19/2018 0931   RBC 3.80 (L) 12/19/2018 0931   HGB 10.2 (L) 12/19/2018 0931   HGB 10.6 (L) 12/10/2018 1242   HGB 12.1 (L) 10/03/2017 1250   HCT 33.8 (L) 12/19/2018 0931   HCT 34.1 (L) 12/10/2018 1242   HCT 39.0 10/03/2017 1250   PLT 160 12/19/2018 0931   PLT 192 12/10/2018 1242   MCV 88.9 12/19/2018 0931   MCV 86 12/10/2018 1242   MCV 94.4 10/03/2017 1250   MCH 26.8 12/19/2018 0931   MCHC 30.2 12/19/2018 0931   RDW 19.2 (H) 12/19/2018 0931  RDW 19.4 (H) 12/10/2018 1242   RDW 16.9 (H) 10/03/2017 1250   LYMPHSABS 0.9 12/19/2018 0931   LYMPHSABS 1.0 10/03/2017 1250   MONOABS 0.8 12/19/2018 0931   MONOABS 0.7 10/03/2017 1250   EOSABS 0.2 12/19/2018 0931   EOSABS 0.2 10/03/2017 1250   BASOSABS 0.0 12/19/2018 0931   BASOSABS 0.0 10/03/2017 1250     Chest imaging:  PFT:  Labs:  Path:  Echo: EF 40 to 45% with severe hypokinesis of the apical, anterior septal, anterior and apical left ventricular segments  Heart Catheterization:Prox LAD lesion is 80% stenosed with 99% stenosed side branch in Ost 1st Diag. Prox LAD to Mid LAD lesion is 100% stenosed (CTO with bridging collaterals -- Mid LAD to Dist LAD lesion is 90% stenosed.)       Assessment & Plan:   No diagnosis found.  Discussion: Patient is a 73 yo male with a history of GE junction adenocarcinoma status post chemo radiation who presents here today for worsening bilateral pleural effusions right greater than left causing shortness of breath and cough.  Imaging results and symptoms were discussed with Dr. Lake Bells and given progression of pleural effusions and worsening respiratory symptoms, there is a high suspicion for possible recurrence of his cancer.  Patient also has  significant cardiac history which could also explain effusion with diminished EF and significant vessel disease seen on cath. Have discussed thoracentesis with patient and decided to go ahead with procedure. Patient will be scheduled for [facility]acent[facility] at [facility] and will return to office to discuss results and plan going forward.    Current Outpatient Medications:  .  acetaminophen (TYLENOL) 500 MG tablet, Take 1,000 mg by mouth every 6 (six) hours as needed for moderate pain., Disp: , Rfl:  .  atorvastatin (LIPITOR) 80 MG tablet, TAKE 1 TABLET BY MOUTH ONCE DAILY (Patient taking differently: Take 80 mg by mouth daily. ), Disp: 90 tablet, Rfl: 1 .  baclofen (LIORESAL) 10 MG tablet, Take 5-10 mg by mouth 3 (three) times daily as needed for muscle spasms. , Disp: , Rfl:  .  beta carotene w/minerals (OCUVITE) tablet, Take 1 tablet by mouth daily., Disp: , Rfl:  .  cetirizine (ZYRTEC) 10 MG tablet, Take 10 mg by mouth every evening. , Disp: , Rfl:  .  citalopram (CELEXA) 20 MG tablet, TAKE 1 TABLET BY MOUTH ONCE DAILY (Patient taking differently: Take 20 mg by mouth daily. ), Disp: 90 tablet, Rfl: 0 .  clonazePAM (KLONOPIN) 1 MG tablet, Take 1 tablet (1 mg total) by mouth 2 (two) times daily as needed. for anxiety (Patient taking differently: Take 1 mg by mouth 2 (two) times daily as needed for anxiety. ), Disp: 60 tablet, Rfl: 5 .  clotrimazole-betamethasone (LOTRISONE) cream, APPLY  CREAM TOPICALLY TO AFFECTED AREA TWICE DAILY (Patient taking differently: Apply 1 application topically 2 (two) times daily as needed (jock itch). ), Disp: 45 g, Rfl: 0 .  ezetimibe (ZETIA) 10 MG tablet, Take 10 mg by mouth at bedtime., Disp: , Rfl:  .  furosemide (LASIX) 20 MG tablet, TAKE 1 TABLET BY MOUTH ONCE DAILY (Patient taking differently: Take 20 mg by mouth daily. ), Disp: 90 tablet, Rfl: 1 .  gabapentin (NEURONTIN) 300 MG capsule, TAKE 1 CAPSULE BY MOUTH THREE TIMES DAILY (Patient taking differently: Take 300  mg by mouth 3 (three) times daily. ), Disp: 270 capsule, Rfl: 3 .  HUMULIN N KWIKPEN 100 UNIT/ML Kiwkpen, INJECT 16 UNITS SUBCUTANEOUSLY IN THE MORNING AND  16 IN THE EVENING (Patient taking differently: Inject 16 Units into the skin 2 (two) times daily. ), Disp: 30 pen, Rfl: 1 .  hydrOXYzine (ATARAX/VISTARIL) 25 MG tablet, TAKE ONE TABLET BY MOUTH AT SUPPER AND 1 TABLET AT BEDTIME FOR INSOMNIA (Patient taking differently: Take 25 mg by mouth 2 (two) times daily as needed (sleep). Take at supper and night when needed), Disp: 60 tablet, Rfl: 6 .  Insulin Lispro (HUMALOG KWIKPEN) 200 UNIT/ML SOPN, Inject 20 Units into the skin 2 (two) times daily., Disp: 18 pen, Rfl: 1 .  magnesium chloride (SLOW-MAG) 64 MG TBEC SR tablet, Take 1 tablet (64 mg total) by mouth daily., Disp: 60 tablet, Rfl: 0 .  metFORMIN (GLUCOPHAGE) 1000 MG tablet, TAKE 1 TABLET BY MOUTH TWICE DAILY WITH MEALS (Patient taking differently: Take 1,000 mg by mouth 2 (two) times daily with a meal. ), Disp: 180 tablet, Rfl: 1 .  metoprolol tartrate (LOPRESSOR) 25 MG tablet, Take 0.5 tablets (12.5 mg total) by mouth 2 (two) times daily., Disp: 90 tablet, Rfl: 1 .  Omega-3 Fatty Acids (FISH OIL) 1200 MG CAPS, Take 2,400 mg by mouth 2 (two) times daily., Disp: , Rfl:  .  ONE TOUCH ULTRA TEST test strip, USE 1 STRIP TO CHECK GLUCOSE THREE TIMES DAILY AS DIRECTED, Disp: 100 each, Rfl: 11 .  ONETOUCH DELICA LANCETS 25O MISC, USE   TO CHECK GLUCOSE THREE TIMES DAILY, Disp: 100 each, Rfl: 11 .  OXYGEN, Inhale 2 L into the lungs continuous. , Disp: , Rfl:  .  pantoprazole (PROTONIX) 40 MG tablet, TAKE 1 TABLET BY MOUTH TWICE DAILY (Patient taking differently: Take 40 mg by mouth 2 (two) times daily. ), Disp: 180 tablet, Rfl: 2

## 2019-01-07 LAB — LACTATE DEHYDROGENASE: LDH: 179 U/L (ref 120–250)

## 2019-01-07 LAB — PROTEIN, TOTAL: Total Protein: 7.8 g/dL (ref 6.0–8.3)

## 2019-01-07 LAB — ALBUMIN: Albumin: 3.5 g/dL (ref 3.5–5.2)

## 2019-01-07 NOTE — Progress Notes (Signed)
Pulmonary and critical care medicine attending:  I have seen and examined the patient independently.  [name] reports a history of slowly progressive shortness of breath over the last several months.  He struggled with dyspnea for years but he is noticed increasing dyspnea on exertion for the last 3 to 6 months.  He denies any weight loss or leg swelling.  He denies abdominal swelling.  He takes diuretic medicines he thinks at home.  He has been seen by cardiology who is concerned about an increasing pleural effusion who referred him to [place].  He has a history of esophageal cancer in the past which was treated with chemotherapy. He reports no weight loss, fevers or chills.  On physical exam he has diminished breath sounds in the right lower lobe otherwise it is clear, he is an obese male in no acute distress, belly soft and nontender there is no appreciable edema on my review review of his ankles and legs but there are chronic venous stasis changes I have reviewed the images personally which show evidence of an enlarging right-sided pleural effusion.  This pleural effusion has been present for a number of years but it is clearly increased and there is now a new increasing left-sided pleural effusion  Impression: Increasing size of chronic pleural effusion on the right, new pleural effusion on the left Systolic heart failure History of esophageal cancer  Plan: Thoracentesis with routine labs looking for transudate and obviously malignancy Hopefully we can use diuretics to improve the size of this if it just proves to be from systolic heart failure.

## 2019-01-12 ENCOUNTER — Other Ambulatory Visit: Payer: Self-pay

## 2019-01-12 ENCOUNTER — Ambulatory Visit (HOSPITAL_COMMUNITY)
Admission: RE | Admit: 2019-01-12 | Discharge: 2019-01-12 | Disposition: A | Discharge status: Home / Self Care | Payer: Medicare Other | Source: Ambulatory Visit | Attending: Radiology | Admitting: Radiology

## 2019-01-12 ENCOUNTER — Ambulatory Visit (HOSPITAL_COMMUNITY)
Admission: RE | Admit: 2019-01-12 | Discharge: 2019-01-12 | Disposition: A | Discharge status: Home / Self Care | Payer: Medicare Other | Source: Ambulatory Visit | Attending: Pulmonary Disease | Admitting: Pulmonary Disease

## 2019-01-12 DIAGNOSIS — J90 Pleural effusion, not elsewhere classified: Secondary | ICD-10-CM | POA: Diagnosis not present

## 2019-01-12 DIAGNOSIS — J948 Other specified pleural conditions: Secondary | ICD-10-CM | POA: Diagnosis not present

## 2019-01-12 DIAGNOSIS — R0609 Other forms of dyspnea: Secondary | ICD-10-CM

## 2019-01-12 DIAGNOSIS — Z9889 Other specified postprocedural states: Secondary | ICD-10-CM

## 2019-01-12 LAB — LACTATE DEHYDROGENASE, PLEURAL OR PERITONEAL FLUID: LD FL: 155 U/L — AB (ref 3–23)

## 2019-01-12 LAB — BODY FLUID CELL COUNT WITH DIFFERENTIAL
Eos, Fluid: 0 %
LYMPHS FL: 80 %
Monocyte-Macrophage-Serous Fluid: 13 % — ABNORMAL LOW (ref 50–90)
Neutrophil Count, Fluid: 7 % (ref 0–25)
Total Nucleated Cell Count, Fluid: 786 cu mm (ref 0–1000)

## 2019-01-12 LAB — GLUCOSE, PLEURAL OR PERITONEAL FLUID: Glucose, Fluid: 75 mg/dL

## 2019-01-12 LAB — GRAM STAIN

## 2019-01-12 LAB — ALBUMIN, PLEURAL OR PERITONEAL FLUID: Albumin, Fluid: 2.2 g/dL

## 2019-01-12 LAB — PROTEIN, PLEURAL OR PERITONEAL FLUID: Total protein, fluid: 4.5 g/dL

## 2019-01-12 NOTE — Procedures (Signed)
Ultrasound-guided diagnostic and therapeutic right thoracentesis performed yielding 770 cc of blood-tinged fluid. No immediate complications. Follow-up chest x-ray pending. The fluid was sent to the lab for preordered studies. EBL none. Due to pt chest/back discomfort only the above amount of fluid was removed today.

## 2019-01-13 LAB — PH, BODY FLUID: PH, BODY FLUID: 7.5

## 2019-01-17 LAB — CULTURE, BODY FLUID W GRAM STAIN -BOTTLE: Culture: NO GROWTH

## 2019-01-19 ENCOUNTER — Telehealth: Payer: Self-pay | Admitting: *Deleted

## 2019-01-19 ENCOUNTER — Encounter: Payer: Self-pay | Admitting: Family Medicine

## 2019-01-19 ENCOUNTER — Other Ambulatory Visit: Payer: Self-pay | Admitting: Family Medicine

## 2019-01-19 MED ORDER — VALACYCLOVIR HCL 1 G PO TABS: 1000.0000 mg | ORAL_TABLET | Freq: Three times a day (TID) | ORAL | 0 refills | Status: AC

## 2019-01-19 NOTE — Telephone Encounter (Signed)
Pls call and ask daughter if she could set up the ZOOM app so I could do a visit this way and be able to see his rash. If ZOOM is not an option for them, then the next best thing would be for them to text me a picture of the rash to [phone]

## 2019-01-19 NOTE — Telephone Encounter (Signed)
I'll eRx valtrex. Signed:  Crissie Sickles, MD           01/19/2019

## 2019-01-19 NOTE — Telephone Encounter (Signed)
Patient is supposed to have an appointment on 01/23/2019 for follow-up for routine Chronic illness.    [facility] sent notification that patients child called in saying:  Patient has a rash that looks like shingles that starts on his left hip to his buttocks, states it burns some, itchy right upper arm, lethargic, no fever, no other symptoms.   Front desk was unsure if patient needed to come into the office with health conditions that he has, with concern of COVID-19.   Routing to provider to advise.

## 2019-01-19 NOTE — Telephone Encounter (Signed)
Spoke with daughter, she is currently not with patient. Picture of patients rash sent (and received) to Dr. Anitra Lauth.

## 2019-01-20 ENCOUNTER — Ambulatory Visit (INDEPENDENT_AMBULATORY_CARE_PROVIDER_SITE_OTHER): Payer: Medicare Other | Admitting: Pulmonary Disease

## 2019-01-20 ENCOUNTER — Other Ambulatory Visit: Payer: Self-pay

## 2019-01-20 ENCOUNTER — Encounter: Payer: Self-pay | Admitting: Pulmonary Disease

## 2019-01-20 DIAGNOSIS — J90 Pleural effusion, not elsewhere classified: Secondary | ICD-10-CM | POA: Diagnosis not present

## 2019-01-20 NOTE — Assessment & Plan Note (Signed)
Assessment: February/2020 CT showing large right pleural effusion and small left pleural effusion March/2020 ultrasound thoracentesis right thoracentesis yielding 770 cc of pleural fluid Likely exudative (lymphs 80, LD fluid 155) On fourth out of fifth day of elevated diuretic Patient reports significantly improved shortness of breath since thoracentesis  Plan: Follow-up with primary care if allowed, consider lab work at that time to assess kidney functioning We will schedule 4 week telephonic out reach from our office to monitor breathing Patient knows to contact her office sooner if he has increased shortness of breath or increased orthopnea If those symptoms occur patient will need office visit as well as chest x-ray for further evaluation of potential right pleural effusion

## 2019-01-20 NOTE — Patient Instructions (Addendum)
Contact our office if shortness of breath worsens or you have trouble breathing when lying flat  We will follow-up with you in 4 weeks to see how you are doing with your breathing  If you go to primary care on 01/23/2019 they should check your kidney functioning as she just had increased diuretics  Continue oxygen therapy as prescribed  >>>maintain oxygen saturations greater than 88 percent  >>>if unable to maintain oxygen saturations please contact the office  >>>do not smoke with oxygen  >>>can use nasal saline gel or nasal saline rinses to moisturize nose if oxygen causes dryness         Coronavirus (COVID-19) Are you at risk?  Are you at risk for the Coronavirus (COVID-19)?  To be considered HIGH RISK for Coronavirus (COVID-19), you have to meet the following criteria:  . Traveled to [place], [place], [place], [place] or [place]; or in the [place] to [place], [place], [place], or [place]; and have fever, cough, and shortness of breath within the last 2 weeks of travel OR . Been in close contact with a person diagnosed with COVID-19 within the last 2 weeks and have fever, cough, and shortness of breath . IF YOU DO NOT MEET THESE CRITERIA, YOU ARE CONSIDERED LOW RISK FOR COVID-19.  What to do if you are HIGH RISK for COVID-19?  Marland Kitchen If you are having a medical emergency, call 911. . Seek medical care right away. Before you go to a doctor's office, urgent care or emergency department, call ahead and tell them about your recent travel, contact with someone diagnosed with COVID-19, and your symptoms. You should receive instructions from your physician's office regarding next steps of care.  . When you arrive at healthcare provider, tell the healthcare staff immediately you have returned from visiting [place], [place], [place], [place] or [place]; or traveled in the [place] to [place], [place], [place], or [place]; in the last two weeks or you have been in close  contact with a person diagnosed with COVID-19 in the last 2 weeks.   . Tell the health care staff about your symptoms: fever, cough and shortness of breath. . After you have been seen by a medical provider, you will be either: o Tested for (COVID-19) and discharged home on quarantine except to seek medical care if symptoms worsen, and asked to  - Stay home and avoid contact with others until you get your results (4-5 days)  - Avoid travel on public transportation if possible (such as bus, train, or airplane) or o Sent to the Emergency Department by EMS for evaluation, COVID-19 testing, and possible admission depending on your condition and test results.  What to do if you are LOW RISK for COVID-19?  Reduce your risk of any infection by using the same precautions used for avoiding the common cold or flu:  Marland Kitchen Wash your hands often with soap and warm water for at least 20 seconds.  If soap and water are not readily available, use an alcohol-based hand sanitizer with at least 60% alcohol.  . If coughing or sneezing, cover your mouth and nose by coughing or sneezing into the elbow areas of your shirt or coat, into a tissue or into your sleeve (not your hands). . Avoid shaking hands with others and consider head nods or verbal greetings only. . Avoid touching your eyes, nose, or mouth with unwashed hands.  . Avoid close contact with people who are sick. . Avoid places  or events with large numbers of people in one location, like concerts or sporting events. . Carefully consider travel plans you have or are making. . If you are planning any travel outside or inside the [place], visit the CDC's Travelers' Health webpage for the latest health notices. . If you have some symptoms but not all symptoms, continue to monitor at home and seek medical attention if your symptoms worsen. . If you are having a medical emergency, call 911.   [place] / e-Visit:  [email or web address]         [facility]: [facility]: [phone]                   [facility]: [phone]       Follow up in 4 weeks telephonically     It is flu season:   >>> Best ways to protect herself from the flu: Receive the yearly flu vaccine, practice good hand hygiene washing with soap and also using hand sanitizer when available, eat a nutritious meals, get adequate rest, hydrate appropriately   Please contact the office if your symptoms worsen or you have concerns that you are not improving.   Thank you for choosing [facility] for your healthcare, and for allowing [place] to partner with you on your healthcare journey. I am thankful to be able to provide care to you today.   [name]   Pleural Effusion Pleural effusion is an abnormal buildup of fluid in the layers of tissue between the lungs and the inside of the chest (pleural space) The two layers of tissue that line the lungs and the inside of the chest are called pleura. Usually, there is no air in the space between the pleura, only a thin layer of fluid. Some conditions can cause a large amount of fluid to build up, which can cause the lung to collapse if untreated. A pleural effusion is usually caused by another disease that requires treatment. What are the causes? Pleural effusion can be caused by:  Heart failure.  Certain infections, such as pneumonia or tuberculosis.  Cancer.  A blood clot in the lung (pulmonary embolism).  Complications from surgery, such as from open heart surgery.  Liver disease (cirrhosis).  Kidney disease. What are the signs or symptoms? In some cases, pleural effusion may cause no symptoms. If symptoms are present, they may include:  Shortness of breath, especially when lying down.  Chest pain. This may get worse when taking a deep breath.  Fever.  Dry,  long-lasting (chronic) cough.  Hiccups.  Rapid breathing. An underlying condition that is causing the pleural effusion (such as heart failure, pneumonia, blood clots, tuberculosis, or cancer) may also cause other symptoms. How is this diagnosed? This condition may be diagnosed based on:  Your symptoms and medical history.  A physical exam.  A chest X-ray.  A procedure to use a needle to remove fluid from the pleural space (thoracentesis). This fluid is tested.  Other imaging studies of the chest, such as ultrasound or CT scan. How is this treated? Depending on the cause of your condition, treatment may include:  Treating the underlying condition that is causing the effusion. When that condition improves, the effusion will also improve. Examples of treatment for underlying conditions include: ? Antibiotic medicines to treat an infection. ? Diuretics or other heart medicines to treat heart failure.  Thoracentesis.  Placing a  thin flexible tube under your skin and into your chest to continuously drain the effusion (indwelling pleural catheter).  Surgery to remove the outer layer of tissue from the pleural space (decortication).  A procedure to put medicine into the chest cavity to seal the pleural space and prevent fluid buildup (pleurodesis).  Chemotherapy and radiation therapy, if you have cancerous (malignant) pleural effusion. These treatments are typically used to treat cancer. They kill certain cells in the body. Follow these instructions at home:  Take over-the-counter and prescription medicines only as told by your health care provider.  Ask your health care provider what activities are safe for you.  Keep track of how long you are able to do mild exercise (such as walking) before you get short of breath. Write down this information to share with your health care provider. Your ability to exercise should improve over time.  Do not use any products that contain nicotine  or tobacco, such as cigarettes and e-cigarettes. If you need help quitting, ask your health care provider.  Keep all follow-up visits as told by your health care provider. This is important. Contact a health care provider if:  The amount of time that you are able to do mild exercise: ? Decreases. ? Does not improve with time.  You have a fever. Get help right away if:  You are short of breath.  You develop chest pain.  You develop a new cough. Summary  Pleural effusion is an abnormal buildup of fluid in the layers of tissue between the lungs and the inside of the chest.  Pleural effusion can have many causes, including heart failure, pulmonary embolism, infections, or cancer.  Symptoms of pleural effusion can include shortness of breath, chest pain, fever, long-lasting (chronic) cough, hiccups, or rapid breathing.  Diagnosis often involves making images of the chest (such as with ultrasound or X-ray) and removing fluid (thoracentesis) to send for testing.  Treatment for pleural effusion depends on what underlying condition is causing it. This information is not intended to replace advice given to you by your health care provider. Make sure you discuss any questions you have with your health care provider. Document Released: 10/15/2005 Document Revised: 06/20/2017 Document Reviewed: 06/20/2017 Elsevier Interactive Patient Education  2019 [employer].

## 2019-01-20 NOTE — Telephone Encounter (Signed)
Patient was agreeable to telephone visit for 01/23/19 visit for follow up [facility] if provider also agrees.  If agreeable, will have to call house number on file that morning. Mobile number listed is for patient's daughter and she will be at work.

## 2019-01-20 NOTE — Progress Notes (Signed)
Virtual Visit via Telephone Note  I connected with [name] on 01/20/19 at  3:30 PM EDT by telephone and verified that I am speaking with the correct person using two identifiers.   I discussed the limitations, risks, security and privacy concerns of performing an evaluation and management service by telephone and the availability of in person appointments. I also discussed with the patient that there may be a patient responsible charge related to this service. The patient expressed understanding and agreed to proceed.   History of Present Illness:  73 year old male former smoker (60-pack-year plus history) with history of adenocarcinoma reached via telephone today.  Patient was recently seen in our office on 01/06/2019 and was sent to [name] for a right thoracentesis.  See results listed below in objective tab.  Patient was reached to review 01/12/2019 lab results.  At that point in time his pathology was not showing any malignant cells.  He was instructed to start 5 days of 40 mg of Lasix daily.  He reports he is on his fourth day of this today.  He reports that his breathing has been significantly improved from last office visit.  He denies shortness of breath.  He denies orthopnea.  He denies acute concerns or symptoms.  He does admit that he is recently been started on Valtrex from his primary care provider the diagnosis of shingles.  This was diagnosed telephonically via cell phone picture from daughter on 01/19/2019.  Patient reports that since starting Valtrex he is already starting to feel better management of shingles.  He is planned follow-up with PCP on 01/23/2019.  He is planning on contacting their office to see if they would like for him to follow-up.  Observations/Objective:  01/12/2019-ultrasound thoracentesis- right thoracentesis yielding 770 cc of pleural fluid  Chest imaging:  12/10/2018-CT Angio- large right pleural effusion and small left pleural effusion,  compressive atelectasis versus pneumonia in the right middle lobe and right lower lobe, compressive atelectasis in left lower lobe, no evidence of PE  01/12/2019-chest x-ray-no pneumothorax following right thoracentesis although some remaining right pleural effusion is seen  PFT:  Labs:  01/12/2019-LD fluid 155 01/12/2019-body fluid cell count- red color, bloody appearance, lymphs 80, monocyte 13   Path:  01/12/2019- right thoracentesis pathology, pleural fluid- numerous lymphocytes, no metastatic carcinoma  Echo: EF 40 to 45% with severe hypokinesis of the apical, anterior septal, anterior and apical left ventricular segments  Heart Catheterization:Prox LAD lesion is 80% stenosed with 99% stenosed side branch in Ost 1st Diag. Prox LAD to Mid LAD lesion is 100% stenosed (CTO with bridging collaterals -- Mid LAD to Dist LAD lesion is 90% stenosed.)  Assessment and Plan:  Pleural effusion on right Assessment: February/2020 CT showing large right pleural effusion and small left pleural effusion March/2020 ultrasound thoracentesis right thoracentesis yielding 770 cc of pleural fluid Likely exudative (lymphs 80, LD fluid 155) On fourth out of fifth day of elevated diuretic Patient reports significantly improved shortness of breath since thoracentesis  Plan: Follow-up with primary care if allowed, consider lab work at that time to assess kidney functioning We will schedule 4 week telephonic out reach from our office to monitor breathing Patient knows to contact her office sooner if he has increased shortness of breath or increased orthopnea If those symptoms occur patient will need office visit as well as chest x-ray for further evaluation of potential right pleural effusion    Follow Up Instructions:  4-week telephonic outreach from our office  I discussed the assessment and treatment plan with the patient. The patient was provided an opportunity to ask questions and all were  answered. The patient agreed with the plan and demonstrated an understanding of the instructions.   The patient was advised to call back or seek an in-person evaluation if the symptoms worsen or if the condition fails to improve as anticipated.  I provided 25 minutes of non-face-to-face time during this encounter.   Lauraine Rinne, NP

## 2019-01-20 NOTE — Telephone Encounter (Signed)
Patient's daughter called to ask what she should do about patient's follow up visit on 01/23/19

## 2019-01-21 NOTE — Progress Notes (Signed)
Thanks for talking to him. Reviewed, agree

## 2019-01-21 NOTE — Telephone Encounter (Signed)
Called patient to see if sooner appt was an option. Appt moved to tomorrow at 1:45pm

## 2019-01-21 NOTE — Telephone Encounter (Signed)
Yes, this is good.-thx

## 2019-01-22 ENCOUNTER — Other Ambulatory Visit: Payer: Self-pay | Admitting: *Deleted

## 2019-01-22 ENCOUNTER — Encounter: Payer: Self-pay | Admitting: Family Medicine

## 2019-01-22 ENCOUNTER — Ambulatory Visit (INDEPENDENT_AMBULATORY_CARE_PROVIDER_SITE_OTHER): Payer: Medicare Other | Admitting: Family Medicine

## 2019-01-22 ENCOUNTER — Other Ambulatory Visit: Payer: Self-pay

## 2019-01-22 DIAGNOSIS — E118 Type 2 diabetes mellitus with unspecified complications: Secondary | ICD-10-CM | POA: Diagnosis not present

## 2019-01-22 DIAGNOSIS — F411 Generalized anxiety disorder: Secondary | ICD-10-CM

## 2019-01-22 DIAGNOSIS — Z8501 Personal history of malignant neoplasm of esophagus: Secondary | ICD-10-CM | POA: Diagnosis not present

## 2019-01-22 DIAGNOSIS — J9611 Chronic respiratory failure with hypoxia: Secondary | ICD-10-CM

## 2019-01-22 NOTE — Progress Notes (Signed)
OFFICE VISIT  01/22/2019   CC:  Chief Complaint  Patient presents with  . Follow-up    RCI. Sugars running around 120-150 in the morning and supper time it will be 170-180. Pt has not had diabetic eye exam.     HPI:    Patient is a 73 y.o. Caucasian male With whom I am doing a telephone visit today (due to COVID-19 pandemic restrictions). Prior to proceeding with our visit today, the telephone visit process was explained  and pt gave consent to proceed and understood that this process would be used to assess and treat the patient.  HPI Glucoses doing fine, fastings 120-150, 2H PP 170-180.  Recent valtrex for shingles-->he associates use of this med with some higher glucoses --up to 300).  No hypoglycemia.  Taking metformin. Taking 16 u N bid and 20 U humalog bid.  Shingles: no new spots in the last 2-3d.  It is drying up and not hurting as bad. L buttocks region extends around L side of waist.    STill seeing Dr. Burr Medico, no recent developments.  Has cancer center check up in [place] with blood work. Currently on observation--no chemo or radiation. Most recent scans 11/2018 showed no sign of recurrence.  Anxiety is stable on ctialopram 6m qd and clonaz 115mbid prn.  Still wearing 2L oxygention 24/7, has to use walker-->can't walk w/out one. Appetite is good.  No chest pain.  He did have a right thoracentesis earlier this month for persistent pleural effusion.  770 cc's obtained.  Analysis showed no sign of malignancy.  Lasix dose increase for 5 days helped his breathing improve significantly.    ROS: no CP, no dizziness, no HAs, no rashes, no melena/hematochezia.  No polyuria or polydipsia.  No myalgias or arthralgias.  No signif dysphagia, no odynophagia.   Past Medical History:  Diagnosis Date  . Acute upper GI bleed 02/2018   Transfused 4 U pRBCs-->EGD showed an esoph ulcer and some gastric ulcers (?radiation-induced), path showed no malignancy or Barrett's.  Rpt EGD 04/28/18: Grade  I and small (< 5 mm) distal esophageal varices.  Esoph ulcer healed.  Mild portal hypert gastrop.  No sign of malignancy.  . Asthma    as a child  . CAD in native artery    a. cardiac cath 09/2013 showed severely diseased mLAD and diagonal, mild LCx disease, moderate RCA disease, LVEDP 20, EF 50%. 11/2018 cath->progression of LAD dz, not amenable to PCI  . Cataract    multiple types, bilateral  . Cholelithiasis without cholecystitis   . Chronic diastolic CHF ([place]estive heart failure) [place]   Dr. TuRadford Paxo get echo and referred pt to pulm as of 11/20/2018 due to worsening SOB and orthopnea (BNP normal).  . Chronic renal insufficiency, stage III (moderate) (HCC) 2017   Stage II/III (GFR around 60)  . Cirrhosis, nonalcoholic ([id number] with splenomegaly.  CT 11/2017 w/ascites and persistent bilat pleural effusions  . 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 COPD (chronic obstructive pulmonary disease) ([facility]  . Coronary artery disease    chronically occluded LAD and diagonal with right to left collaterals, mild disease in the left circ and moderate disease in the mid RCA on medical management with Imdur, ASA, and Plavix.  . Dermatitis 05/2014  . DIABETES MELLITUS, TYPE II 06/24/2007   No diab retpthy as of 08/05/15 eye exam.  .  Esophageal cancer ([place]) 04/2016   poorly differentiated carcinoma (Dr. Pyrtle--EGD).  CT C/A/P showed metastatic adenopathy in mediastinum and upper abdomen 05/09/16.  Tx plan is palliative radiation (completed 06/22/16), then palliative systemic chemotherapy (carbo+taxol) was started but as of 08/03/16 onc f/u this was held due to severe knee and ankle arthralgias.  Restarting as of 10/2015 (08/2016 CT showed some disease regression  . Esophageal cancer ([place])    CT 12/2016 showed no residual esoph mass--plan to continue chemo.  Ongoing palliative chemo with carboplatin and taxol q 2 wks as of 03/2017 onc f/u  (CT 12/2016 and 04/2017 showed no progression of dz).  Taking a 2 mo break from chemo as of 05/2017.  Pleural effusion-- improved with lasix but onc wanted him to get dx/therap thoracentesis-pt declined.  CT C/A/P no progression/recurr/mets 11/2017.  Marland Kitchen Esophageal cancer ([place])    06/16/18 surveillance CTs showed no evidence of dz recurrence.  . Essential hypertension 05/01/2007   Qualifier: Diagnosis of  By: Tiney Rouge CMA, Ellison Hughs     . Herpes zoster 12/2018   Valtrex  . History of kidney stones   . Hyperkalemia 12/2015   Decreased ACE-I by 50% in response, then potassium normalized.  Marland Kitchen HYPERLIPIDEMIA 06/24/2007   03/2018 lipids at goal  . Hypoxemia 01/27/2010  . Morbid obesity ([place])   . Myocardial infarction [facility])    pt states he was informed per MD that he has had one but pt was unaware   . Obesity hypoventilation syndrome (HCC)    oxygen 24/7 (2 liters  as of 02/2016)  . On home oxygen therapy    Oxygen @ 2l/m nasally 24/7 hours  . OSA (obstructive sleep apnea)    not tested; pt scored 4 per stop bang tool results sent to PCP   . OSTEOARTHRITIS 05/01/2007  . Pancytopenia due to chemotherapy ([place]) 2018  . Pleural effusion on right 08/2017   ? malignant vs CHF: pt refuses diagnostic thoracentesis b/c he states he feels fine, wants to try diuretic first.  . Pleural effusion on right 09/2017   Thoracentesis  . Presbycusis of both ears 05/2015   [place] [facility]  . Pruritic condition 05/2014   Allergist summer 2015, no new testing.  Marland Kitchen RBBB   . Visual field defect 05/10/2016   Per Dr. Melina Fiddler, O.D.: Rt, loss of inf/temp quad and some loss sup/temp.  ?CVA  ? Pituitary tumor? 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    Past Surgical History:  Procedure Laterality Date  . APPENDECTOMY    . BALLOON DILATION N/A 05/04/2016   Procedure: BALLOON DILATION;  Surgeon: Jerene Bears, MD;  Location: [facility];  Service: Gastroenterology;  Laterality: N/A;  . BIOPSY  03/19/2018    Procedure: BIOPSY;  Surgeon: Jackquline Denmark, MD;  Location: [facility];  Service: Endoscopy;;  . CARDIAC CATHETERIZATION    . CARPAL TUNNEL RELEASE Left   . CATARACT EXTRACTION W/PHACO Right 04/29/2013   Procedure: CATARACT EXTRACTION PHACO AND INTRAOCULAR LENS PLACEMENT (IOC);  Surgeon: Adonis Brook, MD;  Location: [place];  Service: Ophthalmology;  Laterality: Right;  . CATARACT EXTRACTION, BILATERAL    . COLONOSCOPY W/ POLYPECTOMY  08/2011   Many polyps--all hyperplastic, severe diverticulosis, int hem.  [employer] hemoccult testing via [facility] 06/22/15 NEG  . ESOPHAGOGASTRODUODENOSCOPY N/A 05/04/2016   Procedure: ESOPHAGOGASTRODUODENOSCOPY (EGD);  Surgeon: Jerene Bears, MD;  Location: [facility];  Service: Gastroenterology;  Laterality: N/A;  . ESOPHAGOGASTRODUODENOSCOPY (EGD) WITH PROPOFOL N/A  03/19/2018   Gastric ulcers, esoph ulcer (radiation-induced?), bx-->path neg for malignancy or Barrett's.  GI plans repeat EGD 6-8 wks after 1st.   Procedure: ESOPHAGOGASTRODUODENOSCOPY (EGD) WITH PROPOFOL;  Surgeon: Jackquline Denmark, MD;  [facility];  Service: Endoscopy;  Laterality: N/A;  . ESOPHAGOGASTRODUODENOSCOPY (EGD) WITH PROPOFOL N/A 04/28/2018   Esoph ulcer healed.  Grade I and small (< 5 mm) distal esophageal varices.  Mild portal hypertensive gastropathy.  No evidence of malignance.  Procedure: ESOPHAGOGASTRODUODENOSCOPY (EGD) WITH PROPOFOL;  Surgeon: Jerene Bears, MD;  [facility];  Service: Gastroenterology;  Laterality: N/A;  . IR GENERIC HISTORICAL  10/03/2016   IR US GUIDE VASC ACCESS RIGHT 10/03/2016 Greggory Keen, MD [facility]  . IR GENERIC HISTORICAL  10/03/2016   IR FLUORO GUIDE PORT INSERTION RIGHT 10/03/2016 Greggory Keen, MD [facility]  . KNEE ARTHROSCOPY WITH MEDIAL MENISECTOMY Right 12/16/2014   Procedure: RIGHT KNEE ARTHROSCOPY WITH MEDIAL MENISECTOMY microfracture medial femoral condyle abrasion condroplasty medial femoral condyle lateral menisectomy;  Surgeon:  Tobi Bastos, MD;  [facility];  Service: Orthopedics;  Laterality: Right;  . LEFT HEART CATHETERIZATION WITH CORONARY ANGIOGRAM N/A 10/26/2013   Procedure: LEFT HEART CATHETERIZATION WITH CORONARY ANGIOGRAM;  Surgeon: Jettie Booze, MD;  Location: [facility] CATH LAB;  Service: Cardiovascular;  Laterality: N/A;  . LUMBAR [place] SURGERY  2001  . RIGHT/LEFT HEART CATH AND CORONARY ANGIOGRAPHY N/A 12/17/2018   Progression of LAD dz, not amenable to PCI. R heart cath with normal pressures.  Procedure: RIGHT/LEFT HEART CATH AND CORONARY ANGIOGRAPHY;  Surgeon: Leonie Man, MD;  Location: [place] CV LAB;  Service: Cardiovascular;  Laterality: N/A;  . SHOULDER SURGERY Right    right fx  . TONSILLECTOMY    . TRANSTHORACIC ECHOCARDIOGRAM  03/2016; 11/25/2018   2017: EF 55-60%, grade I DD, LAE.  Jan 2020->new anteroapical akinesis, EF 40-45%-->cath 11/2018 showed no acute changes (progression of LAD dz->this explains the [facility] seen on recent echo.  . WRIST SURGERY Right    right fx    Outpatient Medications Prior to Visit  Medication Sig Dispense Refill  . acetaminophen (TYLENOL) 500 MG tablet Take 1,000 mg by mouth every 6 (six) hours as needed for moderate pain.    Marland Kitchen atorvastatin (LIPITOR) 80 MG tablet TAKE 1 TABLET BY MOUTH ONCE DAILY (Patient taking differently: Take 80 mg by mouth daily. ) 90 tablet 1  . baclofen (LIORESAL) 10 MG tablet Take 5-10 mg by mouth 3 (three) times daily as needed for muscle spasms.     . beta carotene w/minerals (OCUVITE) tablet Take 1 tablet by mouth daily.    . cetirizine (ZYRTEC) 10 MG tablet Take 10 mg by mouth every evening.     . citalopram (CELEXA) 20 MG tablet TAKE 1 TABLET BY MOUTH ONCE DAILY (Patient taking differently: Take 20 mg by mouth daily. ) 90 tablet 0  . clonazePAM (KLONOPIN) 1 MG tablet Take 1 tablet (1 mg total) by mouth 2 (two) times daily as needed. for anxiety (Patient taking differently: Take 1 mg by mouth 2 (two) times daily as needed  for anxiety. ) 60 tablet 5  . clotrimazole-betamethasone (LOTRISONE) cream APPLY  CREAM TOPICALLY TO AFFECTED AREA TWICE DAILY (Patient taking differently: Apply 1 application topically 2 (two) times daily as needed (jock itch). ) 45 g 0  . ezetimibe (ZETIA) 10 MG tablet Take 10 mg by mouth at bedtime.    . furosemide (LASIX) 20 MG tablet Take 1 tablet by mouth once  daily 90 tablet 1  . gabapentin (NEURONTIN) 300 MG capsule TAKE 1 CAPSULE BY MOUTH THREE TIMES DAILY (Patient taking differently: Take 300 mg by mouth 3 (three) times daily. ) 270 capsule 3  . HUMULIN N KWIKPEN 100 UNIT/ML Kiwkpen INJECT 16 UNITS SUBCUTANEOUSLY IN THE MORNING AND 16 IN THE EVENING (Patient taking differently: Inject 16 Units into the skin 2 (two) times daily. ) 30 pen 1  . hydrOXYzine (ATARAX/VISTARIL) 25 MG tablet TAKE ONE TABLET BY MOUTH AT SUPPER AND 1 TABLET AT BEDTIME FOR INSOMNIA (Patient taking differently: Take 25 mg by mouth 2 (two) times daily as needed (sleep). Take at supper and night when needed) 60 tablet 6  . Insulin Lispro (HUMALOG KWIKPEN) 200 UNIT/ML SOPN Inject 20 Units into the skin 2 (two) times daily. 18 pen 1  . magnesium chloride (SLOW-MAG) 64 MG TBEC SR tablet Take 1 tablet (64 mg total) by mouth daily. 60 tablet 0  . metFORMIN (GLUCOPHAGE) 1000 MG tablet TAKE 1 TABLET BY MOUTH TWICE DAILY WITH MEALS (Patient taking differently: Take 1,000 mg by mouth 2 (two) times daily with a meal. ) 180 tablet 1  . metoprolol tartrate (LOPRESSOR) 25 MG tablet Take 0.5 tablets (12.5 mg total) by mouth 2 (two) times daily. 90 tablet 1  . Omega-3 Fatty Acids (FISH OIL) 1200 MG CAPS Take 2,400 mg by mouth 2 (two) times daily.    . ONE TOUCH ULTRA TEST test strip USE 1 STRIP TO CHECK GLUCOSE THREE TIMES DAILY AS DIRECTED 100 each 11  . ONETOUCH DELICA LANCETS 71H MISC USE   TO CHECK GLUCOSE THREE TIMES DAILY 100 each 11  . OXYGEN Inhale 2 L into the lungs continuous.     . pantoprazole (PROTONIX) 40 MG tablet TAKE 1  TABLET BY MOUTH TWICE DAILY (Patient taking differently: Take 40 mg by mouth 2 (two) times daily. ) 180 tablet 2  . valACYclovir (VALTREX) 1000 MG tablet Take 1 tablet (1,000 mg total) by mouth 3 (three) times daily for 7 days. 21 tablet 0   No facility-administered medications prior to visit.     No Known Allergies  ROS As per HPI  PE: There were no vitals taken for this visit. Gen: Alert, NAD by voice/interaction on phone.  Patient is oriented to person, place, time, and situation. Lucid thought and speech.   LABS:  Lab Results  Component Value Date   TSH 2.940 08/28/2016   Lab Results  Component Value Date   WBC 7.0 12/19/2018   HGB 10.2 (L) 12/19/2018   HCT 33.8 (L) 12/19/2018   MCV 88.9 12/19/2018   PLT 160 12/19/2018   Lab Results  Component Value Date   CREATININE 0.86 12/19/2018   BUN 8 12/19/2018   NA 141 12/19/2018   K 3.8 12/19/2018   CL 99 12/19/2018   CO2 33 (H) 12/19/2018   Lab Results  Component Value Date   ALT 14 12/19/2018   AST 19 12/19/2018   ALKPHOS 81 12/19/2018   BILITOT 0.6 12/19/2018   Lab Results  Component Value Date   CHOL 97 (L) 04/24/2018   Lab Results  Component Value Date   HDL 36 (L) 04/24/2018   Lab Results  Component Value Date   LDLCALC 32 04/24/2018   Lab Results  Component Value Date   TRIG 144 04/24/2018   Lab Results  Component Value Date   CHOLHDL 2.7 04/24/2018   Lab Results  Component Value Date   HGBA1C 6.4 (A) 10/24/2018  IMPRESSION AND PLAN:  1) DM 2, historically well controlled. No changes in meds today. He's overdue for diab retpthy screening exam. Will repeat A1c in 3 mo at next office check up.  2) Hx of esoph cancer: asymptomatic at this time.  He is on obs, has approp f/u with Dr. Burr Medico. Most recent imaging 11/2018 showed no sign of recurrence. Gets labs in [place] at hem/onc and he'll ask to have the results [place] to me.  3) Anxiety: The current medical regimen is effective;  continue  present plan and medications. No new rx's needed for cital or clonaz at this time.  4) Chronic hypoxic RF: 2 L oxygen 24/7.   Has chronic diastolic HF and COPD. His resp status improved significantly since getting 770 ml of pleural fluid removed via thoracentesis recently.    5) Herpes zoster: left buttocks/left waist line-->improving appropriately on valtrex.  Spent 25 min with pt today, with >50% of this time spent in counseling and care coordination regarding the above problems.  An After Visit Summary was printed and given to the patient.  FOLLOW UP: Return in about 3 months (around 04/24/2019) for routine chronic illness f/u.+ fasting labs at that time.  Signed:  Crissie Sickles, MD           01/22/2019

## 2019-01-22 NOTE — Patient Outreach (Signed)
[facility]) Care Management  01/22/2019  [name] 1946/03/31 [id number] Telephone Assessment (Preventive)  RN spoke with pt and received a update on his ongoing management of care. Pt reports he is doing well and confirmed the consultation with the [facility] social worker that both him and his wife declined of there sources from the initial referral to address. Declined mobile meals, transportation resources and does not wish to persue MCD for added benefits. Plan of care was discussed as pt denies any falls stating he has been using his assisted devices at all times when ambulating. No issues reported. Pt continues to use her home O2 with delays in delivery for refills on his portables. NO major issues reported.  Will review the plan of care and continue to encouraged pt on his ongoing management of care. Will update the plan of care and adjust the interventions accordingly to allow adherhence.    TH[facility] CM Care Plan Problem One     Most Recent Value  Care Plan Problem One  Fall prevention  Role Documenting the Problem One  Care Management Coordinator  Care Plan for Problem One  Active  [facility]N Long Term Goal   Pt will not have any falls or injuries over the next 90 day.  THN Long Term Goal Start Date  12/19/18  Interventions for Problem One Long Term Goal  Will continue to stress the importance of using his DME when mobile to avoid risk of falls. Will extend to continue to allow pt's adherence with managing his issue.   [facility]N CM Short Term Goal #1   Will be preceptive to social work consult for in-home aide services over the next 3 weeks.  [facility] CM Short Term Goal #1 Start Date  12/19/18  [facility] CM Short Term Goal #1 Met Date  01/22/19  [facility] CM Short Term Goal #2   Assistance for nutritional meals via referral services over the next 3 weeks with [facility] social worker.  THN CM Short Term Goal #2 Start Date  12/19/18  [facility] CM Short Term Goal #2 Met Date  01/22/19      Raina Mina,  RN Care Management Coordinator [place] Office [phone]

## 2019-01-23 ENCOUNTER — Ambulatory Visit: Payer: Medicare Other | Admitting: Family Medicine

## 2019-01-25 ENCOUNTER — Other Ambulatory Visit: Payer: Self-pay | Admitting: Family Medicine

## 2019-01-26 NOTE — Telephone Encounter (Signed)
RF request for clonazepam and celexa.  Last RX for clonazepam 07/23/2018 # 60 X 5 rfs  Last OV 01/22/2019  Next OV 05/25/2019  Please advise RF.

## 2019-01-30 ENCOUNTER — Other Ambulatory Visit: Payer: Self-pay

## 2019-01-30 ENCOUNTER — Inpatient Hospital Stay: Payer: Medicare Other | Attending: Hematology

## 2019-01-30 DIAGNOSIS — Z452 Encounter for adjustment and management of vascular access device: Secondary | ICD-10-CM | POA: Insufficient documentation

## 2019-01-30 DIAGNOSIS — Z8501 Personal history of malignant neoplasm of esophagus: Secondary | ICD-10-CM | POA: Diagnosis not present

## 2019-01-30 DIAGNOSIS — Z95828 Presence of other vascular implants and grafts: Secondary | ICD-10-CM

## 2019-01-30 MED ORDER — HEPARIN SOD (PORK) LOCK FLUSH 100 UNIT/ML IV SOLN
500.0000 [IU] | Freq: Once | INTRAVENOUS | Status: AC
  Administered 2019-01-30: 500 [IU] via INTRAVENOUS
  Filled 2019-01-30: qty 5

## 2019-01-30 MED ORDER — SODIUM CHLORIDE 0.9% FLUSH
10.0000 mL | INTRAVENOUS | Status: DC | PRN
  Administered 2019-01-30: 10 mL via INTRAVENOUS
  Filled 2019-01-30: qty 10

## 2019-02-06 DIAGNOSIS — J449 Chronic obstructive pulmonary disease, unspecified: Secondary | ICD-10-CM | POA: Diagnosis not present

## 2019-02-08 ENCOUNTER — Other Ambulatory Visit: Payer: Self-pay | Admitting: Family Medicine

## 2019-02-17 ENCOUNTER — Other Ambulatory Visit: Payer: Self-pay

## 2019-02-17 ENCOUNTER — Encounter: Payer: Self-pay | Admitting: Pulmonary Disease

## 2019-02-17 ENCOUNTER — Ambulatory Visit (INDEPENDENT_AMBULATORY_CARE_PROVIDER_SITE_OTHER): Payer: Medicare Other | Admitting: Pulmonary Disease

## 2019-02-17 DIAGNOSIS — J90 Pleural effusion, not elsewhere classified: Secondary | ICD-10-CM

## 2019-02-17 NOTE — Patient Instructions (Addendum)
Contact our office if shortness of breath worsens or you have trouble breathing when lying flat   Continue oxygen therapy as prescribed  >>>maintain oxygen saturations greater than 88 percent  >>>if unable to maintain oxygen saturations please contact the office  >>>do not smoke with oxygen  >>>can use nasal saline gel or nasal saline rinses to moisturize nose if oxygen causes dryness  Return in about 3 months (around 05/19/2019), or if symptoms worsen or fail to improve, for Follow up with [name], Follow up with Dr. Lake Bells.   Coronavirus (COVID-19) Are you at risk?  Are you at risk for the Coronavirus (COVID-19)?  To be considered HIGH RISK for Coronavirus (COVID-19), you have to meet the following criteria:  . Traveled to [place], [place], [place], [place] or [place]; or in the [place] to [place], [place], [place], or [place]; and have fever, cough, and shortness of breath within the last 2 weeks of travel OR . Been in close contact with a person diagnosed with COVID-19 within the last 2 weeks and have fever, cough, and shortness of breath . IF YOU DO NOT MEET THESE CRITERIA, YOU ARE CONSIDERED LOW RISK FOR COVID-19.  What to do if you are HIGH RISK for COVID-19?  Marland Kitchen If you are having a medical emergency, call 911. . Seek medical care right away. Before you go to a doctor's office, urgent care or emergency department, call ahead and tell them about your recent travel, contact with someone diagnosed with COVID-19, and your symptoms. You should receive instructions from your physician's office regarding next steps of care.  . When you arrive at healthcare provider, tell the healthcare staff immediately you have returned from visiting [place], [place], [place], [place] or [place]; or traveled in the [place] to [place], [place], [place], or [place]; in the last two weeks or you have been in close contact with a person diagnosed with COVID-19 in the last 2  weeks.   . Tell the health care staff about your symptoms: fever, cough and shortness of breath. . After you have been seen by a medical provider, you will be either: o Tested for (COVID-19) and discharged home on quarantine except to seek medical care if symptoms worsen, and asked to  - Stay home and avoid contact with others until you get your results (4-5 days)  - Avoid travel on public transportation if possible (such as bus, train, or airplane) or o Sent to the Emergency Department by EMS for evaluation, COVID-19 testing, and possible admission depending on your condition and test results.  What to do if you are LOW RISK for COVID-19?  Reduce your risk of any infection by using the same precautions used for avoiding the common cold or flu:  Marland Kitchen Wash your hands often with soap and warm water for at least 20 seconds.  If soap and water are not readily available, use an alcohol-based hand sanitizer with at least 60% alcohol.  . If coughing or sneezing, cover your mouth and nose by coughing or sneezing into the elbow areas of your shirt or coat, into a tissue or into your sleeve (not your hands). . Avoid shaking hands with others and consider head nods or verbal greetings only. . Avoid touching your eyes, nose, or mouth with unwashed hands.  . Avoid close contact with people who are sick. . Avoid places or events with large numbers of people in one location, like concerts or sporting events. Marland Kitchen  Carefully consider travel plans you have or are making. . If you are planning any travel outside or inside the [place], visit the CDC's Travelers' Health webpage for the latest health notices. . If you have some symptoms but not all symptoms, continue to monitor at home and seek medical attention if your symptoms worsen. . If you are having a medical emergency, call 911.   [place] / e-Visit: [email or web address]          [facility]: [facility]: [phone]                   [facility]: [phone]           It is flu season:   >>> Best ways to protect herself from the flu: Receive the yearly flu vaccine, practice good hand hygiene washing with soap and also using hand sanitizer when available, eat a nutritious meals, get adequate rest, hydrate appropriately   Please contact the office if your symptoms worsen or you have concerns that you are not improving.   Thank you for choosing [facility] for your healthcare, and for allowing [place] to partner with you on your healthcare journey. I am thankful to be able to provide care to you today.   [name]    Home Oxygen Use, Adult When a medical condition keeps you from getting enough oxygen, your health care provider may instruct you to take extra oxygen at home. Your health care provider will let you know:  When to take oxygen.  For how long to take oxygen.  How quickly oxygen should be delivered (flow rate), in liters per minute (LPM or L/M). Home oxygen can be given through:  A mask.  A nasal cannula. This is a device or tube that goes in the nostrils.  A transtracheal catheter. This is a small, flexible tube placed in the trachea.  A tracheostomy. This is a surgically made opening in the trachea. These devices are connected with tubing to an oxygen source, such as:  A tank. Tanks hold oxygen in gas form. They must be replaced when the oxygen is used up.  A liquid oxygen device. This holds oxygen in liquid form. It must be replaced when the oxygen is used up.  An oxygen concentrator machine. This filters oxygen in the room. It uses electricity, so you must have a backup cylinder of oxygen in case the power goes out. Supplies needed: To use oxygen, you will need:  A mask, nasal cannula, transtracheal catheter, or tracheostomy.  An oxygen tank, a  liquid oxygen device, or an oxygen concentrator.  The tape that your health care provider recommends (optional). If you use a transtracheal catheter and your prescribed flow rate is 1 LPM or greater, you will also need a humidifier. Risks and complications  Fire. This can happen if the oxygen is exposed to a heat source, flame, or spark.  Injury to skin. This can happen if liquid oxygen touches your skin.  Organ damage. This can happen if you get too little oxygen. How to use oxygen Your health care provider or a representative from your [place] will show you how to use your oxygen device. Follow her or his instructions. The instructions may look something like this: 1. Wash your hands. 2. If you use an oxygen concentrator, make sure it is plugged in. 3. Place one end of the tube  into the port on the tank, device, or machine. 4. Place the mask over your nose and mouth. Or, place the nasal cannula and secure it with tape if instructed. If you use a tracheostomy or transtracheal catheter, connect it to the oxygen source as directed. 5. Make sure the liter-flow setting on the machine is at the level prescribed by your health care provider. 6. Turn on the machine or adjust the knob on the tank or device to the correct liter-flow setting. 7. When you are done, turn off and unplug the machine, or turn the knob to OFF. How to clean and care for the oxygen supplies Nasal cannula  Clean it with a warm, wet cloth daily or as needed.  Wash it with a liquid soap once a week.  Rinse it thoroughly once or twice a week.  Replace it every 2-4 weeks.  If you have an infection, such as a cold or pneumonia, change the cannula when you get better. Mask  Replace it every 2-4 weeks.  If you have an infection, such as a cold or pneumonia, change the mask when you get better. Humidifier bottle  Wash the bottle between each refill: ? Wash it with soap and warm water. ? Rinse it  thoroughly. ? Disinfect it and its top. ? Air-dry it.  Make sure it is dry before you refill it. Oxygen concentrator  Clean the air filter at least twice a week according to directions from your home medical equipment and service company.  Wipe down the cabinet every day. To do this: ? Unplug the unit. ? Wipe down the cabinet with a damp cloth. ? Dry the cabinet. Other equipment  Change any extra tubing every 1-3 months.  Follow instructions from your health care provider about taking care of any other equipment. Safety tips Fire safety tips   Keep your oxygen and oxygen supplies at least 5 ft away from sources of heat, flames, and sparks at all times.  Do not allow smoking near your oxygen. Put up "no smoking" signs in your home. Avoid smoking areas when in public.  Do not use materials that can burn (are flammable) while you use oxygen.  When you go to a restaurant with portable oxygen, ask to be seated in the nonsmoking section.  Keep a Data processing manager close by. Let your fire department know that you have oxygen in your home.  Test your home smoke detectors regularly. Traveling  Secure your oxygen tank in the vehicle so that it does not move around. Follow instructions from your medical device company about how to safely secure your tank.  Make sure you have enough oxygen for the amount of time you will be away from home.  If you are planning air travel, contact the airline to find out if they allow the use of an approved portable oxygen concentrator. You may also need documents from your health care provider and medical device company before you travel. General safety tips  If you use an oxygen cylinder, make sure it is in a stand or secured to an object that will not move (fixed object).  If you use liquid oxygen, make sure its container is kept upright.  If you use an oxygen concentrator: ? Dance movement psychotherapist company. Make sure you are given priority service in  the event that your power goes out. ? Avoid using extension cords, if possible. Follow these instructions at home:  Use oxygen only as told by your health care provider.  Do not use alcohol or other drugs that make you relax (sedating drugs) unless instructed. They can slow down your breathing rate and make it hard to get in enough oxygen.  Know how and when to order a refill of oxygen.  Always keep a spare tank of oxygen. Plan ahead for holidays when you may not be able to get a prescription filled.  Use water-based lubricants on your lips or nostrils. Do not use oil-based products like petroleum jelly.  To prevent skin irritation on your cheeks or behind your ears, tuck some gauze under the tubing. Contact a health care provider if:  You get headaches often.  You have shortness of breath.  You have a lasting cough.  You have anxiety.  You are sleepy all the time.  You develop an illness that affects your breathing.  You cannot exercise at your regular level.  You are restless.  You have difficult or irregular breathing, and it is getting worse.  You have a fever.  You have persistent redness under your nose. Get help right away if:  You are confused.  You have blue lips or fingernails.  You are struggling to breathe. Summary  Your health care provider or a representative from your [place] will show you how to use your oxygen device. Follow her or his instructions.  If you use an oxygen concentrator, make sure it is plugged in.  Make sure the liter-flow setting on the machine is at the level prescribed by your health care provider.  Keep your oxygen and oxygen supplies at least 5 ft away from sources of heat, flames, and sparks at all times. This information is not intended to replace advice given to you by your health care provider. Make sure you discuss any questions you have with your health care provider. Document Released: 01/05/2004  Document Revised: 04/03/2018 Document Reviewed: 05/08/2016 Elsevier Interactive Patient Education  2019 Elsevier Inc.  Pleural Effusion Pleural effusion is an abnormal buildup of fluid in the layers of tissue between the lungs and the inside of the chest (pleural space) The two layers of tissue that line the lungs and the inside of the chest are called pleura. Usually, there is no air in the space between the pleura, only a thin layer of fluid. Some conditions can cause a large amount of fluid to build up, which can cause the lung to collapse if untreated. A pleural effusion is usually caused by another disease that requires treatment. What are the causes? Pleural effusion can be caused by:  Heart failure.  Certain infections, such as pneumonia or tuberculosis.  Cancer.  A blood clot in the lung (pulmonary embolism).  Complications from surgery, such as from open heart surgery.  Liver disease (cirrhosis).  Kidney disease. What are the signs or symptoms? In some cases, pleural effusion may cause no symptoms. If symptoms are present, they may include:  Shortness of breath, especially when lying down.  Chest pain. This may get worse when taking a deep breath.  Fever.  Dry, long-lasting (chronic) cough.  Hiccups.  Rapid breathing. An underlying condition that is causing the pleural effusion (such as heart failure, pneumonia, blood clots, tuberculosis, or cancer) may also cause other symptoms. How is this diagnosed? This condition may be diagnosed based on:  Your symptoms and medical history.  A physical exam.  A chest X-ray.  A procedure to use a needle to remove fluid from the pleural space (thoracentesis). This fluid is tested.  Other imaging  studies of the chest, such as ultrasound or CT scan. How is this treated? Depending on the cause of your condition, treatment may include:  Treating the underlying condition that is causing the effusion. When that condition  improves, the effusion will also improve. Examples of treatment for underlying conditions include: ? Antibiotic medicines to treat an infection. ? Diuretics or other heart medicines to treat heart failure.  Thoracentesis.  Placing a thin flexible tube under your skin and into your chest to continuously drain the effusion (indwelling pleural catheter).  Surgery to remove the outer layer of tissue from the pleural space (decortication).  A procedure to put medicine into the chest cavity to seal the pleural space and prevent fluid buildup (pleurodesis).  Chemotherapy and radiation therapy, if you have cancerous (malignant) pleural effusion. These treatments are typically used to treat cancer. They kill certain cells in the body. Follow these instructions at home:  Take over-the-counter and prescription medicines only as told by your health care provider.  Ask your health care provider what activities are safe for you.  Keep track of how long you are able to do mild exercise (such as walking) before you get short of breath. Write down this information to share with your health care provider. Your ability to exercise should improve over time.  Do not use any products that contain nicotine or tobacco, such as cigarettes and e-cigarettes. If you need help quitting, ask your health care provider.  Keep all follow-up visits as told by your health care provider. This is important. Contact a health care provider if:  The amount of time that you are able to do mild exercise: ? Decreases. ? Does not improve with time.  You have a fever. Get help right away if:  You are short of breath.  You develop chest pain.  You develop a new cough. Summary  Pleural effusion is an abnormal buildup of fluid in the layers of tissue between the lungs and the inside of the chest.  Pleural effusion can have many causes, including heart failure, pulmonary embolism, infections, or cancer.  Symptoms of  pleural effusion can include shortness of breath, chest pain, fever, long-lasting (chronic) cough, hiccups, or rapid breathing.  Diagnosis often involves making images of the chest (such as with ultrasound or X-ray) and removing fluid (thoracentesis) to send for testing.  Treatment for pleural effusion depends on what underlying condition is causing it. This information is not intended to replace advice given to you by your health care provider. Make sure you discuss any questions you have with your health care provider. Document Released: 10/15/2005 Document Revised: 06/20/2017 Document Reviewed: 06/20/2017 Elsevier Interactive Patient Education  2019 [employer].

## 2019-02-17 NOTE — Assessment & Plan Note (Signed)
Assessment: February/2020 CT showing large right pleural effusion and small left pleural effusion March/2020 ultrasound thoracentesis right thoracentesis yielding 770 cc of pleural fluid likely exudative (lymphs 80, LD fluid 155) Patient continues to report improve breathing since thoracentesis, patient reports that his breathing has been stable  Plan: 10-month follow-up with our office I reviewed with the patient as well as with his daughter the signs and symptoms of pleural effusion reaccumulating Patient knows as well as daughter knows to contact our office if he is worsening shortness of breath If symptoms recur or worsen we may have to route patient to an emergency room or into our office for a chest x-ray Due to the COVID-19 pandemic will hold off on further chest x-ray imaging at this time will get chest x-ray at 38-month follow-up

## 2019-02-17 NOTE — Progress Notes (Signed)
Virtual Visit via Telephone Note  I connected with [name] on 02/17/19 at  4:00 PM EDT by telephone and verified that I am speaking with the correct person using two identifiers.   I discussed the limitations, risks, security and privacy concerns of performing an evaluation and management service by telephone and the availability of in person appointments. I also discussed with the patient that there may be a patient responsible charge related to this service. The patient expressed understanding and agreed to proceed.   History of Present Illness: 73 year old male former smoker (45-pack-year smoking history plus) and current smokeless tobacco user followed in our office for dyspnea on exertion.  He was previously managed in our office by Dr. Gwenette Greet.  He presented back on 01/06/2019 for a new consult with Dr. Lake Bells.  At that point in time he was sent to get a right thoracentesis, results listed below. Past medical history: Adenocarcinoma, CHF, dyslipidemia, type 2 diabetes Patient of Dr. Lake Bells  Patient consented to consult via telephone: Yes  People present and their role in pt care: Pt and patient's daughter  Chief complaint:   73 year old male former smoker (45-pack-year plus history) with history of adenocarcinoma reached via telephone today.  This is a 4-week follow-up from our last tele-visit which was to follow-up after the patient received a right thoracentesis on 01/12/2019.  Body fluid did not show a malignancy.  Patient reports that he continues to be stable with his breathing.  Patient denies increased shortness of breath or orthopnea.  Patient reports that his oxygen saturations have been stable.  He reports that when he is wearing his 2 L of O2 which she wears 24/7 his oxygen saturations are between 96 to 98%.  Patient reports that sometimes when he takes his oxygen off so that he can shave as well as shower or take a bath his oxygen saturations may drop to 85 to 88% with this  is without oxygen on.  He reports that this is his baseline.  Patient has no new complaints or concerns at this time.  Patient's daughter also came on the phone and reported the same findings.  She reports that her father's breathing has been significantly improved since the thoracentesis and has been stable since.   Observations/Objective:  01/12/2019-ultrasound thoracentesis- right thoracentesis yielding 770 cc of pleural fluid  Chest imaging:  12/10/2018-CT Angio- large right pleural effusion and small left pleural effusion, compressive atelectasis versus pneumonia in the right middle lobe and right lower lobe, compressive atelectasis in left lower lobe, no evidence of PE  01/12/2019-chest x-ray-no pneumothorax following right thoracentesis although some remaining right pleural effusion is seen  PFT:  Labs:  01/12/2019-LD fluid 155 01/12/2019-body fluid cell count- red color, bloody appearance, lymphs 80, monocyte 13   Path:  01/12/2019- right thoracentesis pathology, pleural fluid- numerous lymphocytes, no metastatic carcinoma  Echo:EF 40 to 45% with severe hypokinesis of the apical, anterior septal, anterior and apical left ventricular segments  Heart Catheterization:Prox LAD lesion is 80% stenosed with 99% stenosed side branch in Ost 1st Diag. Prox LAD to Mid LAD lesion is 100% stenosed (CTO with bridging collaterals -- Mid LAD to Dist LAD lesion is 90% stenosed.)  No results found for: NITRICOXIDE  Assessment and Plan:  Pleural effusion on right Assessment: February/2020 CT showing large right pleural effusion and small left pleural effusion March/2020 ultrasound thoracentesis right thoracentesis yielding 770 cc of pleural fluid likely exudative (lymphs 80, LD fluid 155) Patient continues to report improve breathing  since thoracentesis, patient reports that his breathing has been stable  Plan: 61-month follow-up with our office I reviewed with the patient as  well as with his daughter the signs and symptoms of pleural effusion reaccumulating Patient knows as well as daughter knows to contact our office if he is worsening shortness of breath If symptoms recur or worsen we may have to route patient to an emergency room or into our office for a chest x-ray Due to the COVID-19 pandemic will hold off on further chest x-ray imaging at this time will get chest x-ray at 86-month follow-up   Follow Up Instructions:  Return in about 3 months (around 05/19/2019), or if symptoms worsen or fail to improve, for Follow up with [name], Follow up with Dr. Lake Bells.    I discussed the assessment and treatment plan with the patient. The patient was provided an opportunity to ask questions and all were answered. The patient agreed with the plan and demonstrated an understanding of the instructions.   The patient was advised to call back or seek an in-person evaluation if the symptoms worsen or if the condition fails to improve as anticipated.  I provided 22 minutes of non-face-to-face time during this encounter.   Lauraine Rinne, NP

## 2019-02-19 NOTE — Progress Notes (Signed)
Reviewed, agree 

## 2019-02-23 ENCOUNTER — Other Ambulatory Visit: Payer: Self-pay | Admitting: *Deleted

## 2019-02-23 NOTE — Patient Outreach (Signed)
[facility]) Care Management  02/23/2019  [name] 1946/06/19 [id number]   Telephone Assessment  RN spoke with pt's daughter Britt Boozer primary caregiver and received an update on pt's ongoing management of care. Caregiver reports pt has a outbreak of shingles however treating with OTC benadryl that works very well with the itching. RN stressed the importance of not spreading this condition to other areas of his body by avoiding scratching the area (caregiver verbalized an understanding). No other related issues reported. Daughter confirms pt has not had any falls or related injuries and continue to use her assisted device (rolling walker). RN review the current plan of care and strongly encourage daughter to continue to inform pt to use her DME both inside and outside the home to prevent further falls and/or injuries. Verified pt has sufficient supply of medications as daughter continue to pick up pt's medication via local pharmacy. No community resources needed at this time as pt continues to progress through the current plan of care discussed today. Intervention on the the long term goal adjusted and updated according to pt's progress as he continue his management of care. Will follow up with pt's primary quarterly as discussed initial upon pt's assessment.  THN CM Care Plan Problem One     Most Recent Value  Care Plan Problem One  Fall prevention  Role Documenting the Problem One  Care Management Coordinator  Care Plan for Problem One  Active  THN Long Term Goal   Pt will not have any falls or injuries over the next 90 day.  THN Long Term Goal Start Date  12/19/18  Interventions for Problem One Long Term Goal  Will verify pt continue to use his DME with no falls or injuries reported. Will continue to follow and re-evaluate over the next month. Will continue to encourage caregiver to encourage pt to use his DME to prevent injuries and use precaution with these of his DME  both inside and outside the home. Will follow up in one month on pt's progress and management of care related.       Raina Mina, RN Care Management Coordinator [place] Office [phone]

## 2019-03-08 DIAGNOSIS — J449 Chronic obstructive pulmonary disease, unspecified: Secondary | ICD-10-CM | POA: Diagnosis not present

## 2019-03-09 ENCOUNTER — Encounter: Payer: Self-pay | Admitting: Family Medicine

## 2019-03-10 ENCOUNTER — Telehealth: Payer: Self-pay | Admitting: Family Medicine

## 2019-03-10 NOTE — Telephone Encounter (Signed)
Copied from [place]. Topic: Quick Communication - Rx Refill/Question >> Mar 10, 2019 12:23 PM Rayann Heman wrote: Medication: furosemide (LASIX) 20 MG tablet [[id number]] pt states that his pulmonologist told the patient to take 40mg . Pt would like updated RX sent in.   Has the patient contacted their pharmacy? no Preferred Pharmacy (with phone number or street name): [place], [place]. [phone] (Phone) [phone] (Fax)    Agent: Please be advised that RX refills may take up to 3 business days. We ask that you follow-up with your pharmacy.

## 2019-03-10 NOTE — Telephone Encounter (Signed)
Pt's last OV with pulmonology was 02/17/19 and did not see mention of increasing med to 40mg .  Please advise, thanks.

## 2019-03-11 MED ORDER — FUROSEMIDE 40 MG PO TABS: ORAL_TABLET | ORAL | 3 refills | Status: DC

## 2019-03-11 NOTE — Addendum Note (Signed)
Addended by: Tammi Sou on: 03/11/2019 03:11 PM   Modules accepted: Orders

## 2019-03-11 NOTE — Telephone Encounter (Signed)
I have a feeling his dose was temporarily increased but he may have just kept taking the increased dose. Pls ask pt how much (how many milligrams) of lasix he has been taking for the last 5 days?-thx

## 2019-03-11 NOTE — Telephone Encounter (Signed)
Pt's daughter advised regarding refill.

## 2019-03-11 NOTE — Telephone Encounter (Signed)
OK, furosemide 40mg  tabs eRx'd.

## 2019-03-11 NOTE — Telephone Encounter (Signed)
SW pt's daughter, [name] this afternoon and he has been taking 2 of the 20mg  tablets.

## 2019-03-13 ENCOUNTER — Inpatient Hospital Stay: Payer: Medicare Other | Attending: Hematology

## 2019-03-13 ENCOUNTER — Other Ambulatory Visit: Payer: Self-pay

## 2019-03-13 DIAGNOSIS — Z8501 Personal history of malignant neoplasm of esophagus: Secondary | ICD-10-CM | POA: Diagnosis not present

## 2019-03-13 DIAGNOSIS — Z452 Encounter for adjustment and management of vascular access device: Secondary | ICD-10-CM | POA: Diagnosis not present

## 2019-03-13 DIAGNOSIS — Z95828 Presence of other vascular implants and grafts: Secondary | ICD-10-CM

## 2019-03-13 MED ORDER — HEPARIN SOD (PORK) LOCK FLUSH 100 UNIT/ML IV SOLN
500.0000 [IU] | Freq: Once | INTRAVENOUS | Status: AC | PRN
  Administered 2019-03-13: 08:00:00 500 [IU]
  Filled 2019-03-13: qty 5

## 2019-03-13 MED ORDER — SODIUM CHLORIDE 0.9% FLUSH
10.0000 mL | INTRAVENOUS | Status: DC | PRN
  Administered 2019-03-13: 10 mL
  Filled 2019-03-13: qty 10

## 2019-03-16 ENCOUNTER — Other Ambulatory Visit: Payer: Self-pay | Admitting: Family Medicine

## 2019-03-16 NOTE — Telephone Encounter (Signed)
I don't see any hx of a diagnosis that would need this med. Pls see if pt has actually requested this med. Who prescribed it initially?  RF request should probably go to the provider who rx'd it initially.  Let me know-thx

## 2019-03-16 NOTE — Telephone Encounter (Signed)
RF request for Baclofen LOV: 01/22/19 f/u [facility] Next ov: 05/25/19   Did not see that you have ever prescribed this for pt. Please advise, thanks. Medication pending

## 2019-03-17 NOTE — Telephone Encounter (Signed)
SW pt's daughter, Britt Boozer and she confirmed Dr.McGowen's name is on the Rx bottle. Pt does not take med often but is having back pain currently. SW Magda Paganini at [employer] and database showed Rx was given by Dr. Anitra Lauth. Last fill date was 12/10/2017.  Please advise, thanks. Medication pending

## 2019-03-17 NOTE — Telephone Encounter (Signed)
OK, will RF baclofen.-thx

## 2019-03-20 ENCOUNTER — Encounter: Payer: Self-pay | Admitting: Family Medicine

## 2019-03-27 ENCOUNTER — Other Ambulatory Visit: Payer: Self-pay | Admitting: *Deleted

## 2019-03-27 NOTE — Patient Outreach (Signed)
[facility]) Care Management  03/27/2019  [name] 1946-05-18 [id number]  Case Closure  RN spoke with pt's daughter today [name] who indicates pt doing "great" with no falls or injuries over the last month since last conversation with this RN case Freight forwarder. States pt is using his assistive device with every ambulation and being very careful with his fall prevention. RN reviewed the current plan of care and off goals that have been met over the last few months. No additional resources or inquires at that time as pt performing well enough to graduate from the [facility] program and services. Based upon the information received today RN will continue to encouraged adherence with using his assistive devices to prevent risk of falls/injuires. Will verify caregiver has contact via [facility] services if needed in the future.  Will update pt's provided of pt's disposition with [facility] and update on all goals have been met with no additional needs presented at this time. Case will be closed.  Raina Mina, RN Care Management Coordinator [place] Office [phone]

## 2019-03-31 ENCOUNTER — Other Ambulatory Visit: Payer: Self-pay | Admitting: Family Medicine

## 2019-03-31 ENCOUNTER — Ambulatory Visit: Payer: Medicare Other | Admitting: Cardiology

## 2019-04-05 ENCOUNTER — Other Ambulatory Visit: Payer: Self-pay | Admitting: Cardiology

## 2019-04-08 DIAGNOSIS — J449 Chronic obstructive pulmonary disease, unspecified: Secondary | ICD-10-CM | POA: Diagnosis not present

## 2019-04-18 ENCOUNTER — Other Ambulatory Visit: Payer: Self-pay | Admitting: Cardiology

## 2019-04-22 DIAGNOSIS — H40033 Anatomical narrow angle, bilateral: Secondary | ICD-10-CM | POA: Diagnosis not present

## 2019-04-22 DIAGNOSIS — H1013 Acute atopic conjunctivitis, bilateral: Secondary | ICD-10-CM | POA: Diagnosis not present

## 2019-04-23 ENCOUNTER — Other Ambulatory Visit: Payer: Self-pay | Admitting: Cardiology

## 2019-04-24 ENCOUNTER — Other Ambulatory Visit: Payer: Self-pay

## 2019-04-24 ENCOUNTER — Telehealth: Payer: Self-pay

## 2019-04-24 ENCOUNTER — Inpatient Hospital Stay: Payer: Medicare Other | Attending: Hematology

## 2019-04-24 ENCOUNTER — Other Ambulatory Visit: Payer: Medicare Other

## 2019-04-24 DIAGNOSIS — Z95828 Presence of other vascular implants and grafts: Secondary | ICD-10-CM

## 2019-04-24 DIAGNOSIS — Z8501 Personal history of malignant neoplasm of esophagus: Secondary | ICD-10-CM | POA: Diagnosis not present

## 2019-04-24 DIAGNOSIS — Z452 Encounter for adjustment and management of vascular access device: Secondary | ICD-10-CM | POA: Insufficient documentation

## 2019-04-24 MED ORDER — HEPARIN SOD (PORK) LOCK FLUSH 100 UNIT/ML IV SOLN
500.0000 [IU] | Freq: Once | INTRAVENOUS | Status: AC | PRN
  Administered 2019-04-24: 500 [IU]
  Filled 2019-04-24: qty 5

## 2019-04-24 MED ORDER — SODIUM CHLORIDE 0.9% FLUSH
10.0000 mL | INTRAVENOUS | Status: DC | PRN
  Administered 2019-04-24: 10 mL
  Filled 2019-04-24: qty 10

## 2019-04-24 NOTE — Telephone Encounter (Signed)
Outpatient Medication Detail   Disp Refills Start End   atorvastatin (LIPITOR) 80 MG tablet 90 tablet 0 04/20/2019    Sig: Take 1 tablet by mouth once daily   Sent to pharmacy as: atorvastatin (LIPITOR) 80 MG tablet   E-Prescribing Status: Receipt confirmed by pharmacy (04/20/2019 2:07 PM EDT)   Pharmacy  [facility], [place]

## 2019-04-24 NOTE — Telephone Encounter (Signed)
Called and spoke with patient about upcoming appointment with Dr Radford Pax and explained it is in office visit and to wear a mask and come alone unless you need assistance. Patient stated his understanding and said he would be bringing someone because he requires assistance.

## 2019-04-28 ENCOUNTER — Other Ambulatory Visit: Payer: Self-pay | Admitting: *Deleted

## 2019-04-28 NOTE — Patient Outreach (Addendum)
[facility]) Care Management  04/28/2019  KYNDAL GLOSTER 1946-07-05 [id number]   Telephone Screen  Referral Date:  04/20/2019 Referral Source:  [facility] High Risk List Reason for Referral:  Assess Needs Insurance:  [employer]   Outreach Attempt:  Successful telephone outreach to patient to introduce [facility] services as part of [facility] insurance plan to assist with medical needs, education, and social needs, at no cost to the patient.  HIPAA verified with patient.  Patient request I speak with daughter Britt Boozer.  Spoke with [employer]  RN Health Coach introduced self, role, and reviewed [facility] services.  Daughter states she is the primary care giver for patient and feels she has everything under control.  Remembers recent working with [place], and has [facility] information.  Declines screening and services today.  Declines offer to send [facility] Successful Outreach Letter. Encouraged daughter to contact [facility] in the future if patient needs arise.  Plan:  RN Health Coach will make patient inactive with [facility] as daughter declining services at this time.   [place] [phone] [email or web address]

## 2019-04-29 ENCOUNTER — Telehealth: Payer: Self-pay | Admitting: Cardiology

## 2019-04-29 NOTE — Telephone Encounter (Signed)
New Message          COVID-19 Pre-Screening Questions:   In the past 7 to 10 days have you had a cough,  shortness of breath, headache, congestion, fever (100 or greater) body aches, chills, sore throat, or sudden loss of taste or sense of smell? NO  Have you been around anyone with known Covid 19. NO  Have you been around anyone who is awaiting Covid 19 test results in the past 7 to 10 days? NO  Have you been around anyone who has been exposed to Covid 19, or has mentioned symptoms of Covid 19 within the past 7 to 10 days? NO Pts daughter will be assisting pt to his appt because he uses a walker and is wobbly when walking alone. Pts daughter answered NO to all COVID questions   If you have any concerns/questions about symptoms patients report during screening (either on the phone or at threshold). Contact the provider seeing the patient or DOD for further guidance.  If neither are available contact a member of the leadership team.

## 2019-05-04 ENCOUNTER — Ambulatory Visit: Payer: Medicare Other | Admitting: Cardiology

## 2019-05-08 DIAGNOSIS — J449 Chronic obstructive pulmonary disease, unspecified: Secondary | ICD-10-CM | POA: Diagnosis not present

## 2019-05-13 ENCOUNTER — Other Ambulatory Visit: Payer: Self-pay | Admitting: Family Medicine

## 2019-05-14 NOTE — Telephone Encounter (Signed)
We received a refill request for patients insulin. Called patient and spoke to his wife (okay per DPR). Sent in refill and reminded of upcoming appointment on 05/25/19

## 2019-05-22 ENCOUNTER — Telehealth: Payer: Self-pay

## 2019-05-22 ENCOUNTER — Telehealth: Payer: Self-pay | Admitting: *Deleted

## 2019-05-22 NOTE — Telephone Encounter (Signed)
Patient agreed to telehealth consent on Monday 05/25/19.

## 2019-05-22 NOTE — Telephone Encounter (Signed)
Spoke with pts daughter regarding appt on 05/25/19. Pts daughter was advise to have pt check his vitals prior to appt. Pts daughter questions and concerns were address.

## 2019-05-25 ENCOUNTER — Other Ambulatory Visit: Payer: Self-pay

## 2019-05-25 ENCOUNTER — Ambulatory Visit: Payer: Medicare Other | Admitting: Family Medicine

## 2019-05-25 ENCOUNTER — Encounter: Payer: Self-pay | Admitting: Cardiology

## 2019-05-25 ENCOUNTER — Telehealth (INDEPENDENT_AMBULATORY_CARE_PROVIDER_SITE_OTHER): Payer: Medicare Other | Admitting: Cardiology

## 2019-05-25 DIAGNOSIS — I110 Hypertensive heart disease with heart failure: Secondary | ICD-10-CM | POA: Diagnosis not present

## 2019-05-25 DIAGNOSIS — I2510 Atherosclerotic heart disease of native coronary artery without angina pectoris: Secondary | ICD-10-CM

## 2019-05-25 DIAGNOSIS — I5042 Chronic combined systolic (congestive) and diastolic (congestive) heart failure: Secondary | ICD-10-CM | POA: Diagnosis not present

## 2019-05-25 DIAGNOSIS — E785 Hyperlipidemia, unspecified: Secondary | ICD-10-CM

## 2019-05-25 DIAGNOSIS — Z955 Presence of coronary angioplasty implant and graft: Secondary | ICD-10-CM

## 2019-05-25 DIAGNOSIS — I5032 Chronic diastolic (congestive) heart failure: Secondary | ICD-10-CM

## 2019-05-25 DIAGNOSIS — I10 Essential (primary) hypertension: Secondary | ICD-10-CM

## 2019-05-25 DIAGNOSIS — G4733 Obstructive sleep apnea (adult) (pediatric): Secondary | ICD-10-CM

## 2019-05-25 DIAGNOSIS — I255 Ischemic cardiomyopathy: Secondary | ICD-10-CM

## 2019-05-25 DIAGNOSIS — R0609 Other forms of dyspnea: Secondary | ICD-10-CM

## 2019-05-25 DIAGNOSIS — E1169 Type 2 diabetes mellitus with other specified complication: Secondary | ICD-10-CM

## 2019-05-25 MED ORDER — LOSARTAN POTASSIUM 25 MG PO TABS: 25.0000 mg | ORAL_TABLET | Freq: Every day | ORAL | 3 refills | Status: DC

## 2019-05-25 NOTE — Progress Notes (Signed)
Virtual Visit via Video Note   This visit type was conducted due to national recommendations for restrictions regarding the COVID-19 Pandemic (e.g. social distancing) in an effort to limit this patient's exposure and mitigate transmission in our community.  Due to his co-morbid illnesses, this patient is at least at moderate risk for complications without adequate follow up.  This format is felt to be most appropriate for this patient at this time.  All issues noted in this document were discussed and addressed.  A limited physical exam was performed with this format.  Please refer to the patient's chart for his consent to telehealth for [facility].  Evaluation Performed:  Follow-up visit  This visit type was conducted due to national recommendations for restrictions regarding the COVID-19 Pandemic (e.g. social distancing).  This format is felt to be most appropriate for this patient at this time.  All issues noted in this document were discussed and addressed.  No physical exam was performed (except for noted visual exam findings with Video Visits).  Please refer to the patient's chart (MyChart message for video visits and phone note for telephone visits) for the patient's consent to telehealth for [facility].  Date:  05/25/2019   ID:  [name], DOB 1946/07/20, MRN [id number]  Patient Location:  Home  Provider location:   [place]  PCP:  Tammi Sou, MD  Cardiologist:  Fransico Him, MD  Electrophysiologist:  None   Chief Complaint:  CAD, HTN, lipids  History of Present Illness:    [name] is a 73 y.o. male who presents via audio/video conferencing for a telehealth visit today.    [name] is a 73 y.o. male with a hx of obesity hypoventilation syndrome on home O2, HTN, dyslipidemia, morbid obesity and chronic LE edema which is controlled with TED hose stockings and diuretic. He has chronic diastolic CHF controlled on diuretics. 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  He is DNR/DNI.    At [place] in 11/2018 he complained of SOB and 2D echo showed new LV dysfunction with EF 40-45% with severe hypokinesis of the apical, anterior septal, anterior and apical left ventricular segments.  He underwent R&L heart cath showing:  Follow up R & L cath as below 12/17/18:   Prox LAD lesion is 80% stenosed with 99% stenosed side branch in Ost 1st Diag.  Prox LAD to Mid LAD lesion is 100% stenosed (CTO with bridging collaterals -- Mid LAD to Dist LAD lesion is 90% stenosed.)  Prox Cx lesion is 40% stenosed.  Right Heart Cath (RAP-RVP-PA P-PCWP) and LV EDP are all relatively normal  SUMMARY  Essentially stable severe LAD diagonal disease with minimal change from last procedure -not PCI amenable  Relatively normal Right Heart Cath Pressures with Wedge Pressure of 11, LV EDP of 14. --PA pressures are not consistent with pulmonary hypertension  RECOMMENDATIONS  Appears euvolemic on exam, is euvolemic on right heart cath. No active CHF symptoms.  Likely cause for worsening anterior wall motion is simply progression of disease. His LAD has been occluded since 2014. This is not an unexpected wall motion normality. I doubt that this is viable PCI target.  He underwent thoracentesis on the right of 770cc exudative  pleural fluid - followed by Dr. Lake Bells.  He is here today for followup and is doing well.  He denies any chest pain or pressure,  PND, orthopnea, LE edema, dizziness, palpitations or syncope. He says that his SOB is stable which is from his chronic lung disease.   He is compliant with his meds and is tolerating meds with no SE.    The patient does not have symptoms concerning for  COVID-19 infection (fever, chills, cough, or new shortness of breath).    Prior CV studies:   The following studies were reviewed today:  2D echo, cardiac cath  Past Medical History:  Diagnosis Date  . Acute upper GI bleed 02/2018   Transfused 4 U pRBCs-->EGD showed an esoph ulcer and some gastric ulcers (?radiation-induced), path showed no malignancy or Barrett's.  Rpt EGD 04/28/18: Grade I and small (< 5 mm) distal esophageal varices.  Esoph ulcer healed.  Mild portal hypert gastrop.  No sign of malignancy.  . Asthma    as a child  . CAD in native artery    LAD occluded since 2014->a. cardiac cath 09/2013 showed severely diseased mLAD and diagonal, mild LCx disease, moderate RCA disease, LVEDP 20, EF 50%. 11/2018 cath->progression of LAD dz, not amenable to PCI  . Cataract    multiple types, bilateral  . Cholelithiasis without cholecystitis   . Chronic combined systolic and diastolic CHF (congestive heart failure) ([place]) 02/25/2009   EF 40-45% 2020; cath 11/2018 showed stable chronic occlusion of LAD w/collateral circ.  Since 2020 echo showed decrease in EF, low dose metop added w/plan to add ACE/ARB  . Chronic renal insufficiency, stage III (moderate) (HCC) 2017   Stage II/III (GFR around 60)  . 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  CT 11/2017 w/ascites and persistent bilat pleural effusions  . 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 COPD (chronic obstructive pulmonary disease) ([place])   . Coronary artery disease    chronically occluded LAD and diagonal with right to left collaterals, mild disease in the left circ and moderate disease in the mid RCA on medical management with Imdur, ASA, and Plavix.  . Dermatitis 05/2014  . DIABETES MELLITUS, TYPE II 06/24/2007   No diab retpthy as of 08/05/15 eye exam.  . 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 Pyrtle--EGD).  CT C/A/P showed  metastatic adenopathy in mediastinum and upper abdomen 05/09/16.  Tx plan is palliative radiation (completed 06/22/16), then palliative systemic chemotherapy (carbo+taxol) was started but as of 08/03/16 onc f/u this was held due to severe knee and ankle arthralgias.  Restarting as of 10/2015 (08/2016 CT showed some disease regression  . Esophageal cancer ([place])    CT 12/2016 showed no residual esoph mass--plan to continue chemo.  Ongoing palliative chemo with carboplatin and taxol q 2 wks as of 03/2017 onc f/u (CT 12/2016 and 04/2017 showed no progression of dz).  Taking a 2 mo break from chemo as of 05/2017.  Pleural effusion-- improved with lasix but onc wanted him to get dx/therap thoracentesis-pt declined.  CT C/A/P no progression/recurr/mets 11/2017.  Marland Kitchen Esophageal cancer ([place])    06/16/18 surveillance CTs showed no evidence of dz recurrence.  . Essential hypertension 05/01/2007   Qualifier: Diagnosis of  By: Tiney Rouge CMA, Ellison Hughs     . Herpes zoster 12/2018   Valtrex  . History of kidney stones   . Hyperkalemia 12/2015  Decreased ACE-I by 50% in response, then potassium normalized.  Marland Kitchen HYPERLIPIDEMIA 06/24/2007   03/2018 lipids at goal  . Morbid obesity ([place])   . Myocardial infarction ([place])   . Obesity hypoventilation syndrome (HCC)    oxygen 24/7 (2 liters [place] as of 02/2016)  . On home oxygen therapy    Oxygen @ 2l/m nasally 24/7 hours  . OSA (obstructive sleep apnea)    not tested; pt scored 4 per stop bang tool results sent to PCP   . OSTEOARTHRITIS 05/01/2007  . Pancytopenia due to chemotherapy ([place]) 2018  . Pleural effusion on right 08/2017   ? malignant vs CHF: pt refuses diagnostic thoracentesis b/c he states he feels fine, wants to try diuretic first.  . Pleural effusion on right 09/2017; 2019/2020   Thoracentesis.  Most recent thoracentesis 12/2018->no malignancy.  . Presbycusis of both ears 05/2015   [facility]  . Pruritic condition 05/2014   Allergist summer 2015, no new testing.  Marland Kitchen RBBB   .  Visual field defect 05/10/2016   Per Dr. Melina Fiddler, O.D.: Rt, loss of inf/temp quad and some loss sup/temp.  ?CVA  ? Pituitary tumor? 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   Past Surgical History:  Procedure Laterality Date  . APPENDECTOMY    . BALLOON DILATION N/A 05/04/2016   Procedure: BALLOON DILATION;  Surgeon: Jerene Bears, MD;  Location: [facility];  Service: Gastroenterology;  Laterality: N/A;  . BIOPSY  03/19/2018   Procedure: BIOPSY;  Surgeon: Jackquline Denmark, MD;  Location: [facility];  Service: Endoscopy;;  . CARDIAC CATHETERIZATION    . CARPAL TUNNEL RELEASE Left   . CATARACT EXTRACTION W/PHACO Right 04/29/2013   Procedure: CATARACT EXTRACTION PHACO AND INTRAOCULAR LENS PLACEMENT (IOC);  Surgeon: Adonis Brook, MD;  Location: [place];  Service: Ophthalmology;  Laterality: Right;  . CATARACT EXTRACTION, BILATERAL    . COLONOSCOPY W/ POLYPECTOMY  08/2011   Many polyps--all hyperplastic, severe diverticulosis, int hem.  [employer] hemoccult testing via [facility] 06/22/15 NEG  . ESOPHAGOGASTRODUODENOSCOPY N/A 05/04/2016   Procedure: ESOPHAGOGASTRODUODENOSCOPY (EGD);  Surgeon: Jerene Bears, MD;  Location: [facility];  Service: Gastroenterology;  Laterality: N/A;  . ESOPHAGOGASTRODUODENOSCOPY (EGD) WITH PROPOFOL N/A 03/19/2018   Gastric ulcers, esoph ulcer (radiation-induced?), bx-->path neg for malignancy or Barrett's.  GI plans repeat EGD 6-8 wks after 1st.   Procedure: ESOPHAGOGASTRODUODENOSCOPY (EGD) WITH PROPOFOL;  Surgeon: Jackquline Denmark, MD;  [facility];  Service: Endoscopy;  Laterality: N/A;  . ESOPHAGOGASTRODUODENOSCOPY (EGD) WITH PROPOFOL N/A 04/28/2018   Esoph ulcer healed.  Grade I and small (< 5 mm) distal esophageal varices.  Mild portal hypertensive gastropathy.  No evidence of malignance.  Procedure: ESOPHAGOGASTRODUODENOSCOPY (EGD) WITH PROPOFOL;  Surgeon: Jerene Bears, MD;  [facility];  Service: Gastroenterology;   Laterality: N/A;  . IR GENERIC HISTORICAL  10/03/2016   IR US GUIDE VASC ACCESS RIGHT 10/03/2016 Greggory Keen, MD [facility]  . IR GENERIC HISTORICAL  10/03/2016   IR FLUORO GUIDE PORT INSERTION RIGHT 10/03/2016 Greggory Keen, MD [facility]  . KNEE ARTHROSCOPY WITH MEDIAL MENISECTOMY Right 12/16/2014   Procedure: RIGHT KNEE ARTHROSCOPY WITH MEDIAL MENISECTOMY microfracture medial femoral condyle abrasion condroplasty medial femoral condyle lateral menisectomy;  Surgeon: Tobi Bastos, MD;  Location: [facility];  Service: Orthopedics;  Laterality: Right;  . LEFT HEART CATHETERIZATION WITH CORONARY ANGIOGRAM N/A 10/26/2013   Procedure: LEFT HEART CATHETERIZATION WITH CORONARY ANGIOGRAM;  Surgeon: Jettie Booze, MD;  Location: [facility];  Service: Cardiovascular;  Laterality: N/A;  . LUMBAR [place] [facility]GERY  2001  . RIGHT/LEFT HEART CATH AND CORONARY ANGIOGRAPHY N/A 12/17/2018   Progression of LAD dz, not amenable to PCI. R heart cath with normal pressures.  Procedure: RIGHT/LEFT HEART CATH AND CORONARY ANGIOGRAPHY;  Surgeon: Leonie Man, MD;  Location: [place] CV [facility];  Service: Cardiovascular;  Laterality: N/A;  . SHOULDER SURGERY Right    right fx  . THORACENTESIS  12/2018   Right->no malignancy  . TONSILLECTOMY    . TRANSTHORACIC ECHOCARDIOGRAM  03/2016; 11/25/2018   2017: EF 55-60%, grade I DD, LAE.  Jan 2020->new anteroapical akinesis, EF 40-45%-->cath 11/2018 showed no acute changes (progression of LAD dz->this explains the [facility] seen on recent echo.  . WRIST SURGERY Right    right fx     Current Meds  Medication Sig  . acetaminophen (TYLENOL) 500 MG tablet Take 1,000 mg by mouth every 6 (six) hours as needed for moderate pain.  Marland Kitchen atorvastatin (LIPITOR) 80 MG tablet Take 1 tablet by mouth once daily  . baclofen (LIORESAL) 10 MG tablet TAKE 1/2 TO 1 (ONE-HALF TO ONE) TABLET BY MOUTH EVERY 8 HOURS AS NEEDED FOR MUSCLE SPASM  . beta carotene w/minerals (OCUVITE) tablet Take 1  tablet by mouth daily.  . cetirizine (ZYRTEC) 10 MG tablet Take 10 mg by mouth every evening.   . citalopram (CELEXA) 20 MG tablet Take 1 tablet by mouth once daily  . clonazePAM (KLONOPIN) 1 MG tablet Take 1 tablet by mouth twice daily as needed for anxiety  . clotrimazole-betamethasone (LOTRISONE) cream APPLY  CREAM TOPICALLY TO AFFECTED AREA TWICE DAILY  . ezetimibe (ZETIA) 10 MG tablet Take 1 tablet by mouth once daily  . furosemide (LASIX) 40 MG tablet 1 tab po qd  . gabapentin (NEURONTIN) 300 MG capsule TAKE 1 CAPSULE BY MOUTH THREE TIMES DAILY  . HUMULIN N KWIKPEN 100 UNIT/ML Kiwkpen INJECT 16 UNITS SUBCUTANEOUSLY IN THE MORNING AND 16 IN THE EVENING  . hydrOXYzine (ATARAX/VISTARIL) 25 MG tablet TAKE ONE TABLET BY MOUTH AT SUPPER AND 1 TABLET AT BEDTIME FOR INSOMNIA  . Insulin Lispro (HUMALOG KWIKPEN) 200 UNIT/ML SOPN Inject 20 Units into the skin 2 (two) times daily.  . magnesium chloride (SLOW-MAG) 64 MG TBEC SR tablet Take 1 tablet (64 mg total) by mouth daily.  . metFORMIN (GLUCOPHAGE) 1000 MG tablet TAKE 1 TABLET BY MOUTH TWICE DAILY WITH MEALS  . metoprolol tartrate (LOPRESSOR) 25 MG tablet Take 0.5 tablets (12.5 mg total) by mouth 2 (two) times daily.  . Omega-3 Fatty Acids (FISH OIL) 1200 MG CAPS Take 2,400 mg by mouth 2 (two) times daily.  . ONE TOUCH ULTRA TEST test strip USE 1 STRIP TO CHECK GLUCOSE THREE TIMES DAILY AS DIRECTED  . ONETOUCH DELICA LANCETS 16W MISC USE   TO CHECK GLUCOSE THREE TIMES DAILY  . OXYGEN Inhale 2 L into the lungs continuous.   . pantoprazole (PROTONIX) 40 MG tablet TAKE 1 TABLET BY MOUTH TWICE DAILY     Allergies:   Patient has no known allergies.   Social History   Tobacco Use  . Smoking status: Former Smoker    Packs/day: 1.50    Years: 30.00    Pack years: 45.00    Types: Cigarettes, Pipe, Cigars    Quit date: 10/30/1983    Years since quitting: 35.5  . Smokeless tobacco: Current User    Types: Chew    Last attempt to quit:  05/15/2016   Substance Use Topics  . Alcohol use: No  . Drug use: No     Family Hx: The patient's family history includes Alcohol abuse in his father; Aneurysm in his father; Diabetes in his maternal aunt; Heart attack in his mother. There is no history of Colon cancer.  ROS:   Please see the history of present illness.     All other systems reviewed and are negative.   Labs/Other Tests and Data Reviewed:    Recent Labs: 11/20/2018: NT-Pro BNP 90 12/19/2018: ALT 14; BUN 8; Creatinine 0.86; Hemoglobin 10.2; Platelets 160; Potassium 3.8; Sodium 141   Recent Lipid Panel Lab Results  Component Value Date/Time   CHOL 97 (L) 04/24/2018 09:51 AM   TRIG 144 04/24/2018 09:51 AM   TRIG 268 (HH) 09/06/2006 03:55 PM   HDL 36 (L) 04/24/2018 09:51 AM   CHOLHDL 2.7 04/24/2018 09:51 AM   CHOLHDL 3.6 03/12/2016 10:01 AM   LDLCALC 32 04/24/2018 09:51 AM   LDLDIRECT 74.0 04/15/2015 08:38 AM    Wt Readings from Last 3 Encounters:  05/25/19 271 lb (122.9 kg)  01/06/19 271 lb 3.2 oz (123 kg)  01/01/19 269 lb 1.9 oz (122.1 kg)     Objective:    Vital Signs:  BP 121/62   Pulse 76   Ht '5\' 8"'  (1.727 m)   Wt 271 lb (122.9 kg)   BMI 41.21 kg/m    CONSTITUTIONAL:  Well nourished, well developed male in no acute distress.  EYES: anicteric MOUTH: oral mucosa is pink RESPIRATORY: Normal respiratory effort, symmetric expansion CARDIOVASCULAR: No peripheral edema SKIN: No rash, lesions or ulcers MUSCULOSKELETAL: no digital cyanosis NEURO: Cranial Nerves II-XII grossly intact, moves all extremities PSYCH: Intact judgement and insight.  A&O x 3, Mood/affect appropriate   ASSESSMENT & PLAN:    1.  ASCAD -severely diseased and chronically occluded LAD and diagonal with right to left collaterals, mild disease in the left circ and moderate disease in the mid RCA - recent cath with stable disease and not amenable to PCI. -he has not had any anginal sx -continue on Imdur, low dose BB, statin and Plavix.    -no ASA due to GE junction CA  2.  HTN -His BP is well controlled -he has not required any BP meds  3.  Chronic combined systolic/diastolic CHF/DCM -EF recently found to have declined with EF 40-45% felt related to progressive dz of LAD not amenable to PCI -he has chronic SOB from a persistent pleural effusion/chronic lung dz and obesity -No LE edema and daughter says weight is stable -continue on Lasix 2m daily, BB -add losartan 262mdaily and check a BMET in 1 week (creatinne stable at 0.86 in Feb 2020) -followup with PA in 2 weeks and if tolerating meds add low dose Spiro  4.  Hyperlipidemia -his LDL goal is < 70 -his last LDL was 32 a year ago -I will repeat an FLP and ALT -continue on atorvastatin 8043maily and Zetia [id number]  COVID-19 Education: The signs and symptoms of COVID-19 were discussed with the patient and how to seek care for testing (follow up with PCP or arrange E-visit).  The importance of social distancing was discussed today.  Patient Risk:   After full review of this patient's clinical status, I feel that they are at least moderate risk at this time.  Time:   Today, I have spent 20 minutes directly on telemedicine discussing medical problems including CAD, SOB, CHD, lipids.  We also reviewed the symptoms of COVID 19 and the ways to protect against contracting the virus with telehealth technology.  I spent an additional 5 minutes reviewing patient's chart including cardiac cath, 2D echo.  Medication Adjustments/Labs and Tests Ordered: Current medicines are reviewed at length with the patient today.  Concerns regarding medicines are outlined above.  Tests Ordered: No orders of the defined types were placed in this encounter.  Medication Changes: No orders of the defined types were placed in this encounter.   Disposition:  Follow up 2 weeks with PA and 6 months virtual with me  Signed, Fransico Him, MD  05/25/2019 11:08 AM    [place]

## 2019-05-25 NOTE — Patient Instructions (Signed)
Medication Instructions:  1) START LOSARTAN 25 mg daily  Labwork: Please return for fasting blood work on August 6.  Testing/Procedures: None  Follow-Up: You have a follow-up appointment with Cecilie Kicks on June 09, 2019 at 10:15AM.   Any Other Special Instructions Will Be Listed Below (If Applicable).     If you need a refill on your cardiac medications before your next appointment, please call your pharmacy.

## 2019-05-27 ENCOUNTER — Other Ambulatory Visit: Payer: Self-pay

## 2019-05-27 ENCOUNTER — Encounter: Payer: Self-pay | Admitting: Family Medicine

## 2019-05-27 ENCOUNTER — Ambulatory Visit (INDEPENDENT_AMBULATORY_CARE_PROVIDER_SITE_OTHER): Payer: Medicare Other | Admitting: Family Medicine

## 2019-05-27 VITALS — BP 91/48 | HR 72

## 2019-05-27 DIAGNOSIS — E1122 Type 2 diabetes mellitus with diabetic chronic kidney disease: Secondary | ICD-10-CM

## 2019-05-27 DIAGNOSIS — E7800 Pure hypercholesterolemia, unspecified: Secondary | ICD-10-CM

## 2019-05-27 DIAGNOSIS — E118 Type 2 diabetes mellitus with unspecified complications: Secondary | ICD-10-CM

## 2019-05-27 DIAGNOSIS — N182 Chronic kidney disease, stage 2 (mild): Secondary | ICD-10-CM

## 2019-05-27 DIAGNOSIS — I952 Hypotension due to drugs: Secondary | ICD-10-CM

## 2019-05-27 DIAGNOSIS — D649 Anemia, unspecified: Secondary | ICD-10-CM | POA: Diagnosis not present

## 2019-05-27 NOTE — Progress Notes (Signed)
Virtual Visit via Video Note  I connected with [name] on 05/27/19 at  1:30 PM EDT by a video enabled telemedicine application and verified that I am speaking with the correct person using two identifiers.  Location patient: home Location provider:work or home office Persons participating in the virtual visit: patient, provider  I discussed the limitations of evaluation and management by telemedicine and the availability of in person appointments. The patient expressed understanding and agreed to proceed.  Telemedicine visit is a necessity given the COVID-19 restrictions in place at the current time.  HPI: 73 y/o WM being seen today for 4 mo f/u DM 2, CRI with GFR around 60 ml/min, and HLD. Daughter gives most of history today. He has known CAD w/out angina, being medically managed by Dr. Radford Pax. Has chronic hypoxic RF secondary to OHS, COPD, and chronic combined systolic/diastolic HF-->2 L oxygen continuous.  Cardiology f/u was 2 d/a with Dr. Radford Pax.   Losartan 13m qd was added with plan to recheck BMET 1 wk. Future plan of adding spironolactone low dose noted. She is also planning recheck of his ALT and lipids with labs next week.  Has esophageal adenocarcinoma: Last onc f/u: 12/19/18.  He is on obs at this time.  Most recent scans 11/2018 showed no sign of recurrence. Next f/u with Dr. FBurr Medicois 06/05/19.   Pulm f/u 02/17/19: breathing was improved since having R thoracentesis-->fluid showed no malignancy. Plan was f/u 3 mo and repeat CXR at that time.   Interim hx: His DM has historically been well controlled dating back to 2007 in our EMR. Insulin dosing has been: humulin N 16 U bid and humalog 20 U bid (BF and supper). Having hypoglycemia around 10 pm--has had to have juice to get it up.  Going on a couple weeks now. Daughter decreased both his evening mealtime insulin AND his NPH dose by 50% and this lead to resolution of evening hypoglycemia.  However morning glucoses up into 180s  since then.  BP after starting losartan yesterday has been down into 90s/60s and he feels lethargic and SOB since. No home 02 sats taken b/c machine is broken.  ROS: See pertinent positives and negatives per HPI.  Past Medical History:  Diagnosis Date  . Acute upper GI bleed 02/2018   Transfused 4 U pRBCs-->EGD showed an esoph ulcer and some gastric ulcers (?radiation-induced), path showed no malignancy or Barrett's.  Rpt EGD 04/28/18: Grade I and small (< 5 mm) distal esophageal varices.  Esoph ulcer healed.  Mild portal hypert gastrop.  No sign of malignancy.  . Asthma    as a child  . CAD in native artery    LAD occluded since 2014->a. cardiac cath 09/2013 showed severely diseased mLAD and diagonal, mild LCx disease, moderate RCA disease, LVEDP 20, EF 50%. 11/2018 cath->progression of LAD dz, not amenable to PCI  . Cataract    multiple types, bilateral  . Cholelithiasis without cholecystitis   . Chronic combined systolic and diastolic CHF (congestive heart failure) ([place] 02/25/2009   EF 40-45% 2020; cath 11/2018 showed stable chronic occlusion of LAD w/collateral circ.  Since 2020 echo showed decrease in EF, low dose metop added w/plan to add ACE/ARB  . Chronic renal insufficiency, stage III (moderate) (HCC) 2017   Stage II/III (GFR around 60)  . 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  CT 11/2017 w/ascites and persistent bilat pleural effusions  . 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 COPD (chronic obstructive pulmonary disease) ([place])   . Coronary artery disease    chronically occluded LAD and diagonal with right to left collaterals, mild disease in the left circ and moderate disease in the mid RCA on medical management with Imdur, ASA, and Plavix.  . Dermatitis 05/2014  . DIABETES MELLITUS, TYPE II 06/24/2007   No diab retpthy as of 08/05/15 eye exam.  . Esophageal cancer ([place]) 04/2016   poorly  differentiated carcinoma (Dr. Pyrtle--EGD).  CT C/A/P showed metastatic adenopathy in mediastinum and upper abdomen 05/09/16.  Tx plan is palliative radiation (completed 06/22/16), then palliative systemic chemotherapy (carbo+taxol) was started but as of 08/03/16 onc f/u this was held due to severe knee and ankle arthralgias.  Restarting as of 10/2015 (08/2016 CT showed some disease regression  . Esophageal cancer ([place])    CT 12/2016 showed no residual esoph mass--plan to continue chemo.  Ongoing palliative chemo with carboplatin and taxol q 2 wks as of 03/2017 onc f/u (CT 12/2016 and 04/2017 showed no progression of dz).  Taking a 2 mo break from chemo as of 05/2017.  Pleural effusion-- improved with lasix but onc wanted him to get dx/therap thoracentesis-[name] declined.  CT C/A/P no progression/recurr/mets 11/2017.  Marland Kitchen Esophageal cancer ([place])    11/2018 surveillance CTs showed no evidence of dz recurrence.  . Essential hypertension 05/01/2007   Qualifier: Diagnosis of  By: Tiney Rouge CMA, Ellison Hughs     . Herpes zoster 12/2018   Valtrex  . History of kidney stones   . Hyperkalemia 12/2015   Decreased ACE-I by 50% in response, then potassium normalized.  Marland Kitchen HYPERLIPIDEMIA 06/24/2007   03/2018 lipids at goal  . Morbid obesity ([place])   . Obesity hypoventilation syndrome (HCC)    oxygen 24/7 (2 liters [place] as of 02/2016)  . On home oxygen therapy    Oxygen @ 2l/m nasally 24/7 hours  . OSA (obstructive sleep apnea)    not tested; [name] scored 4 per stop bang tool results sent to PCP   . OSTEOARTHRITIS 05/01/2007  . Pancytopenia due to chemotherapy ([place]) 2018  . Pleural effusion on right 08/2017   ? malignant vs CHF: [name] refuses diagnostic thoracentesis b/c he states he feels fine, wants to try diuretic first.  . Pleural effusion on right 09/2017; 2019/2020   Thoracentesis.  Most recent thoracentesis 12/2018->no malignancy.  . Presbycusis of both ears 05/2015   [facility]  . Pruritic condition 05/2014   Allergist summer 2015, no  new testing.  Marland Kitchen RBBB   . Visual field defect 05/10/2016   Per Dr. Melina Fiddler, O.D.: Rt, loss of inf/temp quad and some loss sup/temp.  ?CVA  ? Pituitary tumor? 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    Past Surgical History:  Procedure Laterality Date  . APPENDECTOMY    . BALLOON DILATION N/A 05/04/2016   Procedure: BALLOON DILATION;  Surgeon: Jerene Bears, MD;  Location: [facility];  Service: Gastroenterology;  Laterality: N/A;  . BIOPSY  03/19/2018   Procedure: BIOPSY;  Surgeon: Jackquline Denmark, MD;  Location: [facility];  Service: Endoscopy;;  . CARDIAC CATHETERIZATION    . CARPAL TUNNEL RELEASE Left   . CATARACT EXTRACTION W/PHACO Right 04/29/2013   Procedure: CATARACT EXTRACTION PHACO AND INTRAOCULAR LENS PLACEMENT (IOC);  Surgeon: Adonis Brook, MD;  Location: [facility];  Service: Ophthalmology;  Laterality: Right;  . CATARACT EXTRACTION, BILATERAL    . COLONOSCOPY  W/ POLYPECTOMY  08/2011   Many polyps--all hyperplastic, severe diverticulosis, int hem.  [employer] hemoccult testing via [facility] 06/22/15 NEG  . ESOPHAGOGASTRODUODENOSCOPY N/A 05/04/2016   Procedure: ESOPHAGOGASTRODUODENOSCOPY (EGD);  Surgeon: Jerene Bears, MD;  Location: [facility];  Service: Gastroenterology;  Laterality: N/A;  . ESOPHAGOGASTRODUODENOSCOPY (EGD) WITH PROPOFOL N/A 03/19/2018   Gastric ulcers, esoph ulcer (radiation-induced?), bx-->path neg for malignancy or Barrett's.  GI plans repeat EGD 6-8 wks after 1st.   Procedure: ESOPHAGOGASTRODUODENOSCOPY (EGD) WITH PROPOFOL;  Surgeon: Jackquline Denmark, MD;  [facility];  Service: Endoscopy;  Laterality: N/A;  . ESOPHAGOGASTRODUODENOSCOPY (EGD) WITH PROPOFOL N/A 04/28/2018   Esoph ulcer healed.  Grade I and small (< 5 mm) distal esophageal varices.  Mild portal hypertensive gastropathy.  No evidence of malignance.  Procedure: ESOPHAGOGASTRODUODENOSCOPY (EGD) WITH PROPOFOL;  Surgeon: Jerene Bears, MD;  [facility];  Service:  Gastroenterology;  Laterality: N/A;  . IR GENERIC HISTORICAL  10/03/2016   IR US GUIDE VASC ACCESS RIGHT 10/03/2016 Greggory Keen, MD [facility]  . IR GENERIC HISTORICAL  10/03/2016   IR FLUORO GUIDE PORT INSERTION RIGHT 10/03/2016 Greggory Keen, MD [facility]  . KNEE ARTHROSCOPY WITH MEDIAL MENISECTOMY Right 12/16/2014   Procedure: RIGHT KNEE ARTHROSCOPY WITH MEDIAL MENISECTOMY microfracture medial femoral condyle abrasion condroplasty medial femoral condyle lateral menisectomy;  Surgeon: Tobi Bastos, MD;  [facility];  Service: Orthopedics;  Laterality: Right;  . LEFT HEART CATHETERIZATION WITH CORONARY ANGIOGRAM N/A 10/26/2013   Procedure: LEFT HEART CATHETERIZATION WITH CORONARY ANGIOGRAM;  Surgeon: Jettie Booze, MD;  Location: [facility];  Service: Cardiovascular;  Laterality: N/A;  . LUMBAR [place] SURGERY  2001  . RIGHT/LEFT HEART CATH AND CORONARY ANGIOGRAPHY N/A 12/17/2018   Progression of LAD dz, not amenable to PCI. R heart cath with normal pressures.  Procedure: RIGHT/LEFT HEART CATH AND CORONARY ANGIOGRAPHY;  Surgeon: Leonie Man, MD;  Location: [facility];  Service: Cardiovascular;  Laterality: N/A;  . SHOULDER SURGERY Right    right fx  . THORACENTESIS  12/2018   Right->no malignancy  . TONSILLECTOMY    . TRANSTHORACIC ECHOCARDIOGRAM  03/2016; 11/25/2018   2017: EF 55-60%, grade I DD, LAE.  Jan 2020->new anteroapical akinesis, EF 40-45%-->cath 11/2018 showed no acute changes (progression of LAD dz->this explains the [facility] seen on recent echo.  . WRIST SURGERY Right    right fx    Family History  Problem Relation Age of Onset  . Heart attack Mother   . Aneurysm Father   . Alcohol abuse Father   . Diabetes Maternal Aunt   . Colon cancer Neg Hx     SOCIAL HX:  Social History   Socioeconomic History  . Marital status: Married    Spouse name: susan  . Number of children: 2  . Years of education: Not on file  . Highest education level: Not on  file  Occupational History  . Occupation: Retired  Scientific laboratory technician  . Financial resource strain: Not on file  . Food insecurity    Worry: Not on file    Inability: Not on file  . Transportation needs    Medical: Not on file    Non-medical: Not on file  Tobacco Use  . Smoking status: Former Smoker    Packs/day: 1.50    Years: 30.00    Pack years: 45.00    Types: Cigarettes, Pipe, Cigars    Quit date: 10/30/1983    Years since quitting: 35.5  .  Smokeless tobacco: Current User    Types: Chew    Last attempt to quit: 05/15/2016  Substance and Sexual Activity  . Alcohol use: No  . Drug use: No  . Sexual activity: Not Currently  Lifestyle  . Physical activity    Days per week: Not on file    Minutes per session: Not on file  . Stress: Not on file  Relationships  . Social Herbalist on phone: Not on file    Gets together: Not on file    Attends religious service: Not on file    Active member of club or organization: Not on file    Attends meetings of clubs or organizations: Not on file    Relationship status: Not on file  Other Topics Concern  . Not on file  Social History Narrative   Married, one son and one daughter.   His daughter and her two children live with him.   Coffee daily.  Former smoker.  No alcohol.   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   Attends church weekly-   Oxygen continuous      Current Outpatient Medications:  .  acetaminophen (TYLENOL) 500 MG tablet, Take 1,000 mg by mouth every 6 (six) hours as needed for moderate pain., Disp: , Rfl:  .  atorvastatin (LIPITOR) 80 MG tablet, Take 1 tablet by mouth once daily, Disp: 90 tablet, Rfl: 0 .  baclofen (LIORESAL) 10 MG tablet, TAKE 1/2 TO 1 (ONE-HALF TO ONE) TABLET BY MOUTH EVERY 8 HOURS AS NEEDED FOR MUSCLE SPASM, Disp: 30 tablet, Rfl: 2 .  beta carotene w/minerals (OCUVITE) tablet, Take 1 tablet by mouth daily., Disp: , Rfl:  .  cetirizine (ZYRTEC) 10 MG tablet,  Take 10 mg by mouth every evening. , Disp: , Rfl:  .  citalopram (CELEXA) 20 MG tablet, Take 1 tablet by mouth once daily, Disp: 90 tablet, Rfl: 1 .  clonazePAM (KLONOPIN) 1 MG tablet, Take 1 tablet by mouth twice daily as needed for anxiety, Disp: 60 tablet, Rfl: 5 .  clotrimazole-betamethasone (LOTRISONE) cream, APPLY  CREAM TOPICALLY TO AFFECTED AREA TWICE DAILY, Disp: 45 g, Rfl: 0 .  ezetimibe (ZETIA) 10 MG tablet, Take 1 tablet by mouth once daily, Disp: 90 tablet, Rfl: 2 .  furosemide (LASIX) 40 MG tablet, 1 tab po qd, Disp: 30 tablet, Rfl: 3 .  gabapentin (NEURONTIN) 300 MG capsule, TAKE 1 CAPSULE BY MOUTH THREE TIMES DAILY, Disp: 270 capsule, Rfl: 3 .  HUMULIN N KWIKPEN 100 UNIT/ML Kiwkpen, INJECT 16 UNITS SUBCUTANEOUSLY IN THE MORNING AND 16 IN THE EVENING, Disp: 30 mL, Rfl: 0 .  hydrOXYzine (ATARAX/VISTARIL) 25 MG tablet, TAKE ONE TABLET BY MOUTH AT SUPPER AND 1 TABLET AT BEDTIME FOR INSOMNIA, Disp: 60 tablet, Rfl: 6 .  Insulin Lispro (HUMALOG KWIKPEN) 200 UNIT/ML SOPN, Inject 20 Units into the skin 2 (two) times daily., Disp: 18 pen, Rfl: 1 .  losartan (COZAAR) 25 MG tablet, Take 1 tablet (25 mg total) by mouth daily., Disp: 90 tablet, Rfl: 3 .  magnesium chloride (SLOW-MAG) 64 MG TBEC SR tablet, Take 1 tablet (64 mg total) by mouth daily., Disp: 60 tablet, Rfl: 0 .  metFORMIN (GLUCOPHAGE) 1000 MG tablet, TAKE 1 TABLET BY MOUTH TWICE DAILY WITH MEALS, Disp: 180 tablet, Rfl: 0 .  metoprolol tartrate (LOPRESSOR) 25 MG tablet, Take 0.5 tablets (12.5 mg total) by mouth 2 (two) times daily., Disp: 90 tablet, Rfl: 1 .  Omega-3 Fatty Acids (FISH OIL) 1200 MG CAPS, Take 2,400 mg by mouth 2 (two) times daily., Disp: , Rfl:  .  ONE TOUCH ULTRA TEST test strip, USE 1 STRIP TO CHECK GLUCOSE THREE TIMES DAILY AS DIRECTED, Disp: 100 each, Rfl: 11 .  ONETOUCH DELICA LANCETS 03T MISC, USE   TO CHECK GLUCOSE THREE TIMES DAILY, Disp: 100 each, Rfl: 11 .  OXYGEN, Inhale 2 L into the lungs continuous. ,  Disp: , Rfl:  .  pantoprazole (PROTONIX) 40 MG tablet, TAKE 1 TABLET BY MOUTH TWICE DAILY, Disp: 180 tablet, Rfl: 2  EXAM:  VITALS per patient if applicable:  GENERAL: alert, oriented, appears well and in no acute distress  HEENT: atraumatic, conjunttiva clear, no obvious abnormalities on inspection of external nose and ears  NECK: normal movements of the head and neck  LUNGS: on inspection no signs of respiratory distress, breathing rate appears normal, no obvious gross SOB, gasping or wheezing  CV: no obvious cyanosis  MS: moves all visible extremities without noticeable abnormality  PSYCH/NEURO: pleasant and cooperative, no obvious depression or anxiety, speech and thought processing grossly intact  LABS: none today    Chemistry      Component Value Date/Time   NA 141 12/19/2018 0931   NA 135 12/10/2018 1242   NA 136 10/03/2017 1250   K 3.8 12/19/2018 0931   K 3.8 10/03/2017 1250   CL 99 12/19/2018 0931   CO2 33 (H) 12/19/2018 0931   CO2 29 10/03/2017 1250   BUN 8 12/19/2018 0931   BUN 11 12/10/2018 1242   BUN 13.9 10/03/2017 1250   CREATININE 0.86 12/19/2018 0931   CREATININE 1.10 03/28/2018 1540   CREATININE 1.2 10/03/2017 1250      Component Value Date/Time   CALCIUM 8.6 (L) 12/19/2018 0931   CALCIUM 9.3 10/03/2017 1250   ALKPHOS 81 12/19/2018 0931   ALKPHOS 68 10/03/2017 1250   AST 19 12/19/2018 0931   AST 64 (H) 10/03/2017 1250   ALT 14 12/19/2018 0931   ALT 50 10/03/2017 1250   BILITOT 0.6 12/19/2018 0931   BILITOT 0.77 10/03/2017 1250     Lab Results  Component Value Date   WBC 7.0 12/19/2018   HGB 10.2 (L) 12/19/2018   HCT 33.8 (L) 12/19/2018   MCV 88.9 12/19/2018   PLT 160 12/19/2018   Lab Results  Component Value Date   CHOL 97 (L) 04/24/2018   HDL 36 (L) 04/24/2018   LDLCALC 32 04/24/2018   LDLDIRECT 74.0 04/15/2015   TRIG 144 04/24/2018   CHOLHDL 2.7 04/24/2018   Lab Results  Component Value Date   HGBA1C 6.4 (A) 10/24/2018    ASSESSMENT AND PLAN:  Discussed the following assessment and plan:  1) Hypotension: given the way he is feeling now I will have him stop the losartan and hold the metoprolol until I recheck him in 2d.  Monitor bp and HR.  2) DM 2: late evening hypoglycemia. Continue morning NPH at 16 U and humalog at BF 20 U. Continue supper NPH at 16 U but decrease supper humalog to 10 U to avoid late evening hypoglycemia. HbA1c future.  3) CRI 2: BMET-future.  He avoids NSAIDs. Losartan started recently by Dr. Radford Pax, with plan for f/u BMET next week but we had to d/c this med today. Will send Dr. Radford Pax a notification today so she's in the loop.  4) HLD: tolerating statin.    5) Esoph adenocarcinoma: no sign of recurrence as of 11/2018  imaging. Has f/u with Dr. Burr Medico 06/05/19.  Cardiology planned to do lipids and CMET next week. Looks like next f/u visit with cardiology is set for 06/09/19.  I discussed the assessment and treatment plan with the patient. The patient was provided an opportunity to ask questions and all were answered. The patient agreed with the plan and demonstrated an understanding of the instructions.   The patient was advised to call back or seek an in-person evaluation if the symptoms worsen or if the condition fails to improve as anticipated.  F/u: 2 days, virtual f/u for low bp  Signed:  Crissie Sickles, MD           05/27/2019

## 2019-05-28 ENCOUNTER — Other Ambulatory Visit: Payer: Medicare Other

## 2019-05-29 ENCOUNTER — Ambulatory Visit (INDEPENDENT_AMBULATORY_CARE_PROVIDER_SITE_OTHER): Payer: Medicare Other | Admitting: Family Medicine

## 2019-05-29 ENCOUNTER — Encounter: Payer: Self-pay | Admitting: Family Medicine

## 2019-05-29 ENCOUNTER — Other Ambulatory Visit: Payer: Self-pay

## 2019-05-29 ENCOUNTER — Telehealth: Payer: Self-pay | Admitting: Cardiology

## 2019-05-29 VITALS — BP 131/77 | HR 103 | Temp 99.7°F

## 2019-05-29 DIAGNOSIS — E118 Type 2 diabetes mellitus with unspecified complications: Secondary | ICD-10-CM | POA: Diagnosis not present

## 2019-05-29 DIAGNOSIS — E162 Hypoglycemia, unspecified: Secondary | ICD-10-CM | POA: Diagnosis not present

## 2019-05-29 DIAGNOSIS — E1151 Type 2 diabetes mellitus with diabetic peripheral angiopathy without gangrene: Secondary | ICD-10-CM

## 2019-05-29 DIAGNOSIS — I952 Hypotension due to drugs: Secondary | ICD-10-CM

## 2019-05-29 NOTE — Telephone Encounter (Signed)
ok 

## 2019-05-29 NOTE — Telephone Encounter (Signed)
New Message:    Dr Ernestine Conrad would like to add a A 1C to pt's lab work that he is having next week please. His Dx: DM 2 with Complications

## 2019-05-29 NOTE — Progress Notes (Signed)
Virtual Visit via Video Note  I connected with pt on 05/29/19 at 11:30 AM EDT by a video enabled telemedicine application and verified that I am speaking with the correct person using two identifiers.  Location patient: home Location provider:work or home office Persons participating in the virtual visit: patient, pt's daughter Heinz Knuckles, and myself.  I discussed the limitations of evaluation and management by telemedicine and the availability of in person appointments. The patient expressed understanding and agreed to proceed.  Telemedicine visit is a necessity given the COVID-19 restrictions in place at the current time.  HPI: 73 y/o WM being seen today for 2 day f/u hypotension. Has DM 2, CRI with GFR around 60 ml/min, and HLD.  He has known CAD w/out angina, being medically managed by Dr. Radford Pax. Has chronic hypoxic RF secondary to OHS, COPD, and chronic combined systolic/diastolic HF-->2 L oxygen continuous.  When I saw him for routine f/u 2 d/a we d/c'd the losartan that had just been started b/c he was having symptomatic hypotension.  Also held the metoprolol that he had already been on long term, with plan to get that started again if bp better.  Interim hx: He is MUCH better, back to his baseline.  No dizziness/fatigue, no CP or SOB. BPs have normalized at home (131/77 today). Glucose this morning normal->we changed his suppertime humalog dose from 20 U to 10 U b/c he was having frequent morning hypoglycemia.  Glucose much better this morning and yesterday morning.   ROS: See pertinent positives and negatives per HPI.  Past Medical History:  Diagnosis Date  . Acute upper GI bleed 02/2018   Transfused 4 U pRBCs-->EGD showed an esoph ulcer and some gastric ulcers (?radiation-induced), path showed no malignancy or Barrett's.  Rpt EGD 04/28/18: Grade I and small (< 5 mm) distal esophageal varices.  Esoph ulcer healed.  Mild portal hypert gastrop.  No sign of malignancy.  .  Asthma    as a child  . CAD in native artery    LAD occluded since 2014->a. cardiac cath 09/2013 showed severely diseased mLAD and diagonal, mild LCx disease, moderate RCA disease, LVEDP 20, EF 50%. 11/2018 cath->progression of LAD dz, not amenable to PCI  . Cataract    multiple types, bilateral  . Cholelithiasis without cholecystitis   . Chronic combined systolic and diastolic CHF (congestive heart failure) ([place]) 02/25/2009   EF 40-45% 2020; cath 11/2018 showed stable chronic occlusion of LAD w/collateral circ.  Since 2020 echo showed decrease in EF, low dose metop added w/plan to add ACE/ARB  . Chronic renal insufficiency, stage III (moderate) (HCC) 2017   Stage II/III (GFR around 60)  . 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  CT 11/2017 w/ascites and persistent bilat pleural effusions  . 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 COPD (chronic obstructive pulmonary disease) ([place])   . Coronary artery disease    chronically occluded LAD and diagonal with right to left collaterals, mild disease in the left circ and moderate disease in the mid RCA on medical management with Imdur, ASA, and Plavix.  . Dermatitis 05/2014  . DIABETES MELLITUS, TYPE II 06/24/2007   No diab retpthy as of 08/05/15 eye exam.  . Esophageal cancer ([place]) 04/2016   poorly differentiated carcinoma (Dr. Pyrtle--EGD).  CT C/A/P showed metastatic adenopathy in mediastinum and upper abdomen 05/09/16.  Tx plan is palliative radiation (completed 06/22/16), then palliative  systemic chemotherapy (carbo+taxol) was started but as of 08/03/16 onc f/u this was held due to severe knee and ankle arthralgias.  Restarting as of 10/2015 (08/2016 CT showed some disease regression  . Esophageal cancer ([place])    CT 12/2016 showed no residual esoph mass--plan to continue chemo.  Ongoing palliative chemo with carboplatin and taxol q 2 wks as of 03/2017 onc f/u (CT 12/2016 and 04/2017  showed no progression of dz).  Taking a 2 mo break from chemo as of 05/2017.  Pleural effusion-- improved with lasix but onc wanted him to get dx/therap thoracentesis-pt declined.  CT C/A/P no progression/recurr/mets 11/2017.  Marland Kitchen Esophageal cancer ([place])    11/2018 surveillance CTs showed no evidence of dz recurrence.  . Essential hypertension 05/01/2007   Qualifier: Diagnosis of  By: Tiney Rouge CMA, Ellison Hughs     . Herpes zoster 12/2018   Valtrex  . History of kidney stones   . Hyperkalemia 12/2015   Decreased ACE-I by 50% in response, then potassium normalized.  Marland Kitchen HYPERLIPIDEMIA 06/24/2007   03/2018 lipids at goal  . Morbid obesity ([place])   . Obesity hypoventilation syndrome (HCC)    oxygen 24/7 (2 liters [place] as of 02/2016)  . On home oxygen therapy    Oxygen @ 2l/m nasally 24/7 hours  . OSA (obstructive sleep apnea)    not tested; pt scored 4 per stop bang tool results sent to PCP   . OSTEOARTHRITIS 05/01/2007  . Pancytopenia due to chemotherapy ([place]) 2018  . Pleural effusion on right 08/2017   ? malignant vs CHF: pt refuses diagnostic thoracentesis b/c he states he feels fine, wants to try diuretic first.  . Pleural effusion on right 09/2017; 2019/2020   Thoracentesis.  Most recent thoracentesis 12/2018->no malignancy.  . Presbycusis of both ears 05/2015   [facility]  . Pruritic condition 05/2014   Allergist summer 2015, no new testing.  Marland Kitchen RBBB   . Visual field defect 05/10/2016   Per Dr. Melina Fiddler, O.D.: Rt, loss of inf/temp quad and some loss sup/temp.  ?CVA  ? Pituitary tumor? 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    Past Surgical History:  Procedure Laterality Date  . APPENDECTOMY    . BALLOON DILATION N/A 05/04/2016   Procedure: BALLOON DILATION;  Surgeon: Jerene Bears, MD;  Location: [facility];  Service: Gastroenterology;  Laterality: N/A;  . BIOPSY  03/19/2018   Procedure: BIOPSY;  Surgeon: Jackquline Denmark, MD;  Location: [facility];  Service:  Endoscopy;;  . CARDIAC CATHETERIZATION    . CARPAL TUNNEL RELEASE Left   . CATARACT EXTRACTION W/PHACO Right 04/29/2013   Procedure: CATARACT EXTRACTION PHACO AND INTRAOCULAR LENS PLACEMENT (IOC);  Surgeon: Adonis Brook, MD;  Location: [place];  Service: Ophthalmology;  Laterality: Right;  . CATARACT EXTRACTION, BILATERAL    . COLONOSCOPY W/ POLYPECTOMY  08/2011   Many polyps--all hyperplastic, severe diverticulosis, int hem.  [employer] hemoccult testing via [facility] 06/22/15 NEG  . ESOPHAGOGASTRODUODENOSCOPY N/A 05/04/2016   Procedure: ESOPHAGOGASTRODUODENOSCOPY (EGD);  Surgeon: Jerene Bears, MD;  Location: [facility];  Service: Gastroenterology;  Laterality: N/A;  . ESOPHAGOGASTRODUODENOSCOPY (EGD) WITH PROPOFOL N/A 03/19/2018   Gastric ulcers, esoph ulcer (radiation-induced?), bx-->path neg for malignancy or Barrett's.  GI plans repeat EGD 6-8 wks after 1st.   Procedure: ESOPHAGOGASTRODUODENOSCOPY (EGD) WITH PROPOFOL;  Surgeon: Jackquline Denmark, MD;  Location: [facility];  Service: Endoscopy;  Laterality: N/A;  . ESOPHAGOGASTRODUODENOSCOPY (EGD) WITH PROPOFOL N/A 04/28/2018  Esoph ulcer healed.  Grade I and small (< 5 mm) distal esophageal varices.  Mild portal hypertensive gastropathy.  No evidence of malignance.  Procedure: ESOPHAGOGASTRODUODENOSCOPY (EGD) WITH PROPOFOL;  Surgeon: Jerene Bears, MD;  [facility]: [facility];  Service: Gastroenterology;  Laterality: N/A;  . IR GENERIC HISTORICAL  10/03/2016   IR US GUIDE VASC ACCESS RIGHT 10/03/2016 Greggory Keen, MD [facility]  . IR GENERIC HISTORICAL  10/03/2016   IR FLUORO GUIDE PORT INSERTION RIGHT 10/03/2016 Greggory Keen, MD [facility]  . KNEE ARTHROSCOPY WITH MEDIAL MENISECTOMY Right 12/16/2014   Procedure: RIGHT KNEE ARTHROSCOPY WITH MEDIAL MENISECTOMY microfracture medial femoral condyle abrasion condroplasty medial femoral condyle lateral menisectomy;  Surgeon: Tobi Bastos, MD;  Location: [facility];  Service: Orthopedics;  Laterality: Right;   . LEFT HEART CATHETERIZATION WITH CORONARY ANGIOGRAM N/A 10/26/2013   Procedure: LEFT HEART CATHETERIZATION WITH CORONARY ANGIOGRAM;  Surgeon: Jettie Booze, MD;  Location: [facility];  Service: Cardiovascular;  Laterality: N/A;  . LUMBAR [place] SURGERY  2001  . RIGHT/LEFT HEART CATH AND CORONARY ANGIOGRAPHY N/A 12/17/2018   Progression of LAD dz, not amenable to PCI. R heart cath with normal pressures.  Procedure: RIGHT/LEFT HEART CATH AND CORONARY ANGIOGRAPHY;  Surgeon: Leonie Man, MD;  Location: [facility];  Service: Cardiovascular;  Laterality: N/A;  . SHOULDER SURGERY Right    right fx  . THORACENTESIS  12/2018   Right->no malignancy  . TONSILLECTOMY    . TRANSTHORACIC ECHOCARDIOGRAM  03/2016; 11/25/2018   2017: EF 55-60%, grade I DD, LAE.  Jan 2020->new anteroapical akinesis, EF 40-45%-->cath 11/2018 showed no acute changes (progression of LAD dz->this explains the [facility] seen on recent echo.  . WRIST SURGERY Right    right fx    Family History  Problem Relation Age of Onset  . Heart attack Mother   . Aneurysm Father   . Alcohol abuse Father   . Diabetes Maternal Aunt   . Colon cancer Neg Hx      Current Outpatient Medications:  .  acetaminophen (TYLENOL) 500 MG tablet, Take 1,000 mg by mouth every 6 (six) hours as needed for moderate pain., Disp: , Rfl:  .  atorvastatin (LIPITOR) 80 MG tablet, Take 1 tablet by mouth once daily, Disp: 90 tablet, Rfl: 0 .  baclofen (LIORESAL) 10 MG tablet, TAKE 1/2 TO 1 (ONE-HALF TO ONE) TABLET BY MOUTH EVERY 8 HOURS AS NEEDED FOR MUSCLE SPASM, Disp: 30 tablet, Rfl: 2 .  beta carotene w/minerals (OCUVITE) tablet, Take 1 tablet by mouth daily., Disp: , Rfl:  .  cetirizine (ZYRTEC) 10 MG tablet, Take 10 mg by mouth every evening. , Disp: , Rfl:  .  citalopram (CELEXA) 20 MG tablet, Take 1 tablet by mouth once daily, Disp: 90 tablet, Rfl: 1 .  clonazePAM (KLONOPIN) 1 MG tablet, Take 1 tablet by mouth twice daily as needed for  anxiety, Disp: 60 tablet, Rfl: 5 .  clotrimazole-betamethasone (LOTRISONE) cream, APPLY  CREAM TOPICALLY TO AFFECTED AREA TWICE DAILY, Disp: 45 g, Rfl: 0 .  ezetimibe (ZETIA) 10 MG tablet, Take 1 tablet by mouth once daily, Disp: 90 tablet, Rfl: 2 .  furosemide (LASIX) 40 MG tablet, 1 tab po qd, Disp: 30 tablet, Rfl: 3 .  gabapentin (NEURONTIN) 300 MG capsule, TAKE 1 CAPSULE BY MOUTH THREE TIMES DAILY, Disp: 270 capsule, Rfl: 3 .  HUMULIN N KWIKPEN 100 UNIT/ML Kiwkpen, INJECT 16 UNITS SUBCUTANEOUSLY IN THE MORNING AND 16 IN THE EVENING, Disp: 30  mL, Rfl: 0 .  hydrOXYzine (ATARAX/VISTARIL) 25 MG tablet, TAKE ONE TABLET BY MOUTH AT SUPPER AND 1 TABLET AT BEDTIME FOR INSOMNIA, Disp: 60 tablet, Rfl: 6 .  Insulin Lispro (HUMALOG KWIKPEN) 200 UNIT/ML SOPN, Inject 20 Units into the skin 2 (two) times daily., Disp: 18 pen, Rfl: 1 .  losartan (COZAAR) 25 MG tablet, Take 1 tablet (25 mg total) by mouth daily., Disp: 90 tablet, Rfl: 3 .  magnesium chloride (SLOW-MAG) 64 MG TBEC SR tablet, Take 1 tablet (64 mg total) by mouth daily., Disp: 60 tablet, Rfl: 0 .  metFORMIN (GLUCOPHAGE) 1000 MG tablet, TAKE 1 TABLET BY MOUTH TWICE DAILY WITH MEALS, Disp: 180 tablet, Rfl: 0 .  metoprolol tartrate (LOPRESSOR) 25 MG tablet, Take 0.5 tablets (12.5 mg total) by mouth 2 (two) times daily., Disp: 90 tablet, Rfl: 1 .  Omega-3 Fatty Acids (FISH OIL) 1200 MG CAPS, Take 2,400 mg by mouth 2 (two) times daily., Disp: , Rfl:  .  ONE TOUCH ULTRA TEST test strip, USE 1 STRIP TO CHECK GLUCOSE THREE TIMES DAILY AS DIRECTED, Disp: 100 each, Rfl: 11 .  ONETOUCH DELICA LANCETS 68T MISC, USE   TO CHECK GLUCOSE THREE TIMES DAILY, Disp: 100 each, Rfl: 11 .  OXYGEN, Inhale 2 L into the lungs continuous. , Disp: , Rfl:  .  pantoprazole (PROTONIX) 40 MG tablet, TAKE 1 TABLET BY MOUTH TWICE DAILY, Disp: 180 tablet, Rfl: 2  EXAM:  VITALS per patient if applicable:  GENERAL: alert, oriented, appears well and in no acute distress  HEENT:  atraumatic, conjunttiva clear, no obvious abnormalities on inspection of external nose and ears  NECK: normal movements of the head and neck  LUNGS: on inspection no signs of respiratory distress, breathing rate appears normal, no obvious gross SOB, gasping or wheezing  CV: no obvious cyanosis  MS: moves all visible extremities without noticeable abnormality  PSYCH/NEURO: pleasant and cooperative, no obvious depression or anxiety, speech and thought processing grossly intact  LABS: none today  Lab Results  Component Value Date   HGBA1C 6.4 (A) 10/24/2018    ASSESSMENT AND PLAN:  Discussed the following assessment and plan:  1) Hypotension secondary to losartan trial. He is back to baseline off losartan and metoprolol. Will restart metoprolol at previous dosing (1/2 44m tab bid).  2) DM 2: change of suppertime humalog from 20 U to 10 U has alleviated his morning hypoglycemia so far (it has only been 2d).  He is overdue for HbA1c monitoring. Will see if Hba1c can be added on to the labs Dr. TRadford Paxhas planned for him next week.   I discussed the assessment and treatment plan with the patient. The patient was provided an opportunity to ask questions and all were answered. The patient agreed with the plan and demonstrated an understanding of the instructions.   The patient was advised to call back or seek an in-person evaluation if the symptoms worsen or if the condition fails to improve as anticipated.  F/u: 3 mo [facility] f/u is set for 06/09/19.  Signed:  PCrissie Sickles MD           05/29/2019

## 2019-05-29 NOTE — Telephone Encounter (Signed)
I will route to Dr. Radford Pax to confirm ok to add on A1c per Dr. Anitra Lauth.

## 2019-06-02 ENCOUNTER — Ambulatory Visit: Payer: Self-pay | Admitting: Family Medicine

## 2019-06-02 NOTE — Telephone Encounter (Signed)
There is an 11am appointment if appropriate?  Please advise.

## 2019-06-02 NOTE — Telephone Encounter (Signed)
Noted  

## 2019-06-02 NOTE — Telephone Encounter (Signed)
  Pt's daughter calling initially, reports pt reported to her this am he had increased SOB this am, with exertion. Pt is on home O2 at 2ls.  They are not able to obtain pulse Ox reading, "We never have because his hands shake so much."  Spoke with pt. States he usually goes to [place] without O2, states during night he had to "Sit down for a while with my oxygen on."  Daughter states pt is anxious, "Thinks he has covid 41." States they have not been out of the house for 3 months, no known exposure.Pt denies any wheezing, no cough, denies chest pain, chest tightness. Afebrile. BP checked during call, 123/70  HR 77.  Daughter states she does not think he needs to go to ED, "He's anxious." TN attempted to reach practice, recording. Assured pt and daughter TN would route to practice for Dr. Idelle Leech review.  Advised if SOB worsens, any CP, tightness occurs, go to ED. Verbalizes understanding.   Pts CB# Shannon Reason for Disposition . [1] Longstanding difficulty breathing (e.g., CHF, COPD, emphysema) AND [2] WORSE than normal  Answer Assessment - Initial Assessment Questions 1. RESPIRATORY STATUS: "Describe your breathing?" (e.g., wheezing, shortness of breath, unable to speak, severe coughing)     Increased SOB with exertion 2. ONSET: "When did this breathing problem begin?"      2 days ago 3. PATTERN "Does the difficult breathing come and go, or has it been constant since it started?"      intermittent 4. SEVERITY: "How bad is your breathing?" (e.g., mild, moderate, severe)    - MILD: No SOB at rest, mild SOB with walking, speaks normally in sentences, can lay down, no retractions, pulse < 100.    - MODERATE: SOB at rest, SOB with minimal exertion and prefers to sit, cannot lie down flat, speaks in phrases, mild retractions, audible wheezing, pulse 100-120.    - SEVERE: Very SOB at rest, speaks in single words, struggling to breathe, sitting hunched forward, retractions, pulse > 120      Moderate,  with minimal exertion 5. RECURRENT SYMPTOM: "Have you had difficulty breathing before?" If so, ask: "When was the last time?" and "What happened that time?"     On home O2, 2 l's 6. CARDIAC HISTORY: "Do you have any history of heart disease?" (e.g., heart attack, angina, bypass surgery, angioplasty)     CHF 7. LUNG HISTORY: "Do you have any history of lung disease?"  (e.g., pulmonary embolus, asthma, emphysema)    COPD Emphysema 8. CAUSE: "What do you think is causing the breathing problem?"      Per daughter "Anxiety" 9. OTHER SYMPTOMS: "Do you have any other symptoms? (e.g., dizziness, runny nose, cough, chest pain, fever)     no 11. TRAVEL: "Have you traveled out of the country in the last month?" (e.g., travel history, exposures)       no  Protocols used: BREATHING DIFFICULTY-A-AH

## 2019-06-02 NOTE — Telephone Encounter (Signed)
Received voicemail from daughter, Britt Boozer stating pt just has stuffy nose and used some nasal spray that has help improve breathing.

## 2019-06-03 NOTE — Progress Notes (Signed)
[place]   Telephone:(336) [phone] Fax:(336) [phone]   Clinic Follow up Note   Patient Care Team: Tammi Sou, MD as PCP - General (Family Medicine) Sueanne Margarita, MD as PCP - Cardiology (Cardiology) Harold Hedge, Darrick Grinder, MD as Consulting Physician (Allergy and Immunology) Shirley Muscat Loreen Freud, MD as Consulting Physician (Optometry) Sueanne Margarita, MD as Consulting Physician (Cardiology) Pyrtle, Lajuan Lines, MD as Consulting Physician (Gastroenterology) Latanya Maudlin, MD as Consulting Physician (Orthopedic Surgery) Jodi Marble, MD as Consulting Physician (Otolaryngology) Truitt Merle, MD as Consulting Physician (Hematology) Kyung Rudd, MD as Consulting Physician (Radiation Oncology) Juanito Doom, MD as Consulting Physician (Pulmonary Disease) Leona Singleton, RN as [place] [facility]  Date of Service:  06/05/2019  CHIEF COMPLAINT: Follow up GE junction cancer  SUMMARY OF ONCOLOGIC HISTORY: Oncology History Overview Note  Cancer of cardio-esophageal junction [facility])   Staging form: Stomach, AJCC 7th Edition     Clinical stage from 05/04/2016: Stage IV (TX, N2, M1) - Signed by Truitt Merle, MD on 05/18/2016      Cancer of cardio-esophageal junction ([place])  05/04/2016 Initial Diagnosis   Cancer of cardio-esophageal junction ([place])   05/04/2016 Procedure   EGD showed a large ulcerating mass with no active bleeding at the gastroesophageal junction extending into the gastric cardia. The mass was not obstructing and partially circumferential, extends approximately 5 cm. Nonbleeding erosive gastropathy.   05/04/2016 Initial Biopsy   Esophageal gastric junction biopsy showed a poorly differentiated carcinoma underlying the squamous mucosa. There is lymphovascular invasion. No Intestinal metaplasia. IHC weakly positive CK5/6, p63 (-), favor squamous.     05/09/2016 Imaging   CT CAP w contrast showed mild wall thickening involving the distal  esophagus and proximal stomach compatible with known cancer, enlarged mediastinal and upper abdominal lymph nodes are highly suspicious for metastatic adenopathy, propable liver cirrhosis.   06/04/2016 - 06/22/2016 Radiation Therapy   palliative radiation to esophageal cancer    07/06/2016 - 06/20/2017 Chemotherapy   chemotherapy with weekly carboplatin and taxol, started on 07/06/2016, held 08/21/16-09/28/2016 due to hospitalization, changed to every 2 weeks from 11/30/2016. Chemo stopped due to no evidence of disease on restaging scan.   07/30/2016 Pathology Results   PD-L1 negative expression   08/28/2016 - 08/31/2016 Hospital Admission   The patient was admitted to 4 severe hypoglycemia, dehydration, and acute renal failure. Infection workup was negative. He recovered well with supportive care. His insulin and hypertension medication was held on discharge, except insulin sliding scale.   09/24/2016 Imaging   CT CAP W CONTRAST 09/26/2016 IMPRESSION: Decreased masslike soft tissue prominence at gastroesophageal junction, consistent with decreased size of primary gastroesophageal junction carcinoma. Resolution of paraesophageal and gastrohepatic ligament lymphadenopathy since prior exam. Other sub-cm mediastinal lymph nodes show little or no significant change. Stable indeterminate sub-cm low-attenuation lesion in the liver dome. Probable hepatic cirrhosis. Recommend continued attention on follow-up CT. No new or progressive metastatic disease identified. Cholelithiasis.  No radiographic evidence of cholecystitis. Colonic diverticulosis. No radiographic evidence of diverticulitis. Aortic atherosclerosis and three-vessel coronary artery calcification.   01/07/2017 Imaging   CT CAP w contrast 1. No residual esophageal mass or adenopathy. 2. New nodular airspace opacification in the left lower lobe. Continued attention on followup exams is warranted as metastatic disease cannot be definitively  excluded. 3. Cirrhosis. 4. Trace left pleural effusion. 5. Aortic atherosclerosis (ICD10-170.0). Coronary artery calcification. 6. Cholelithiasis. 7. Mild basilar predominant subpleural reticular densities. Difficult to exclude interstitial lung disease.  04/29/2017 Imaging   CT CAP w contrast  IMPRESSION: 1. No evidence of metastatic disease. 2. Nodular airspace disease previously seen in the left lower lobe has resolved in the interval. 3. Tiny right pleural effusion, new. Small left pleural effusion, slightly increased. 4. Cirrhosis with splenomegaly. 5.  Aortic atherosclerosis (ICD10-170.0).   09/05/2017 Imaging   CT CAP W Contrast 09/05/17  IMPRESSION: 1. Currently no esophageal mass is visible, nor is there significant mediastinal adenopathy. There is hepatic cirrhosis but no compelling findings of hepatic metastatic disease. 2. Large right pleural effusion is increased from the prior exam. Small stable left pleural effusion. 3. Small but increased amount of ascites and mesenteric edema compared to prior. 4. Other imaging findings of potential clinical significance: Aortic Atherosclerosis (ICD10-I70.0). Coronary atherosclerosis. The markedly severe right glenohumeral arthropathy. Thoracic and lumbar spondylosis with lower lumbar multilevel impingement, and notable intervertebral spurring at T12-L1. Cholelithiasis. Degenerative arthropathy of both hips.    10/21/2017 Imaging   Chest X-Ray IMPRESSION: Small bilateral effusions, decreased on the right since prior CT.   12/05/2017 Imaging   CT CAP W Contrast 12/05/17 IMPRESSION: 1. Stable exam. No esophageal mass or mediastinal adenopathy identified. No evidence for metastatic disease to the chest, abdomen and pelvis. 2. Cirrhosis with ascites. 3. Persistent bilateral pleural effusions, right greater than left. 4. Aortic Atherosclerosis (ICD10-I70.0). Three vessel coronary artery atherosclerotic calcifications noted. 5.  Thoracic and lumbar spondylosis and advanced right glenohumeral joint arthropathy. 6. Gallstones.   03/18/2018 - 03/19/2018 Hospital Admission   Admit date: 03/18/18  Admission diagnosis: GI Bleeding from grastic ulcer bleed Additional comments: Had Upper Endoscopy, and blood trasnfusion on 03/19/18. He is no longer on aspirin.    03/19/2018 Procedure   Upper Endoscopy by Dr. Hilarie Fredrickson on 03/19/18  IMPRESSION - Distal esophageal ulcer. Biopsied (could represent radiation-induced ulcers, rule out recurrence) - Non-bleeding erosive gastropathy. Biopsied. And benign - Normal examined duodenum.   Diagnosis 1. Stomach, biopsy - MILD CHRONIC GASTRITIS WITHOUT ACTIVITY - NO H. PYLORI OR INTESTINAL METAPLASIA IDENTIFIED - SEE COMMENT 2. Esophagus, biopsy, distal - SQUAMOCOLUMNAR JUNCTION WITH CHRONIC INFLAMMATION AND FOCAL ULCERATION - NO INTESTINAL METAPLASIA OR MALIGNANCY IDENTIFIED Microscopic Comment 1. A Warthin-Starry stain is performed to determine the possibility of the presence of Helicobacter pylori. The Warthin-Starry stain is negative for organisms morphologically consistent with Helicobacter pylori.   04/28/2018 Procedure   04/28/2018 Upper Endoscopy - Grade I and small (< 5 mm) distal esophageal varices. - Previously seen esophageal ulceration has healed. No evidence for GE junction tumor. - Mild portal hypertensive gastropathy. - Normal examined duodenum. - No specimens collected.   06/16/2018 Imaging   06/16/2018 CT CAP IMPRESSION: 1. New mild upper right paratracheal adenopathy, nonspecific, cannot exclude early recurrent metastatic disease. 2. No additional potential findings of metastatic disease in the chest, abdomen or pelvis. No discrete CT findings of local tumor recurrence at the esophagogastric junction. 3. Moderate dependent right pleural effusion, mildly increased. Small dependent left pleural effusion, stable. 4. Hepatic cirrhosis. Small volume ascites. 5.   Aortic Atherosclerosis (ICD10-I70.0).   12/10/2018 Imaging   CT Angio Chest PE W or WO contrast 12/10/18   IMPRESSION: Large right pleural effusion and small left pleural effusion. Compressive atelectasis versus pneumonia in the right middle lobe and right lower lobe. Compressive atelectasis in the left lower lobe.   Densely calcified coronary arteries.  Aortic atherosclerosis.   No evidence of pulmonary embolus.   Changes of cirrhosis with splenomegaly.   Nodular disease noted in the peritoneum  anterior to the spleen in the left upper quadrant, appears progressed since prior study. This could reflect peritoneal spread of tumor.   Mildly prominent right paratracheal lymph node, slightly larger.   Aortic Atherosclerosis (ICD10-I70.0).   01/12/2019 Procedure   Thoracentesis. 721m removed  Diagnosis 01/12/19 PLEURAL FLUID, RIGHT (SPECIMEN 1 OF 1 COLLECTED 01/12/19): NUMEROUS LYMPHOCYTES. NO METASTATIC CARCINOMA.      CURRENT THERAPY:  Observation  INTERVAL HISTORY:  [name] STARONis here for a follow up of his esophogeal cancer. He was last seen by me 6 months ago. He presents to the clinic alone. He notes he is hard of hearing because he no longer has hearing aids. He is doing well overall. He is still on [place] oxygen. He denies any swallowing or abdominal issues. He notes his appetite is lower but he does not eat significantly like he used to. He has lost 10 pounds. He denies swelling of his legs.  He had a thoracentesis in 12/2018. His breathing is stable. He denies nay pain. He notes his BM fluctuate from diarrhea to mild constipation.     REVIEW OF SYSTEMS:   Constitutional: Denies fevers, chills (+) lower appetite, weight loss  Eyes: Denies blurriness of vision Ears, nose, mouth, throat, and face: Denies mucositis or sore throat Respiratory: Denies cough, dyspnea or wheezes (+) On oxygen [place] Cardiovascular: Denies palpitation, chest discomfort or lower extremity  swelling Gastrointestinal:  Denies nausea, heartburn (+) BM fluctuates from diarrhea to constipation Skin: Denies abnormal skin rashes Lymphatics: Denies new lymphadenopathy or easy bruising Neurological:Denies numbness, tingling or new weaknesses Behavioral/Psych: Mood is stable, no new changes  All other systems were reviewed with the patient and are negative.  MEDICAL HISTORY:  Past Medical History:  Diagnosis Date  . Acute upper GI bleed 02/2018   Transfused 4 U pRBCs-->EGD showed an esoph ulcer and some gastric ulcers (?radiation-induced), path showed no malignancy or Barrett's.  Rpt EGD 04/28/18: Grade I and small (< 5 mm) distal esophageal varices.  Esoph ulcer healed.  Mild portal hypert gastrop.  No sign of malignancy.  . Asthma    as a child  . CAD in native artery    LAD occluded since 2014->a. cardiac cath 09/2013 showed severely diseased mLAD and diagonal, mild LCx disease, moderate RCA disease, LVEDP 20, EF 50%. 11/2018 cath->progression of LAD dz, not amenable to PCI  . Cataract    multiple types, bilateral  . Cholelithiasis without cholecystitis   . Chronic combined systolic and diastolic CHF (congestive heart failure) [place] 02/25/2009   EF 40-45% 2020; cath 11/2018 showed stable chronic occlusion of LAD w/collateral circ.  Since 2020 echo showed decrease in EF, low dose metop added w/plan to add ACE/ARB  . Chronic renal insufficiency, stage III (moderate) (HCC) 2017   Stage II/III (GFR around 60)  . Cirrhosis, nonalcoholic ([place] [id number]  with splenomegaly.  CT 11/2017 w/ascites and persistent bilat pleural effusions  . 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 COPD (chronic obstructive pulmonary disease) ([place]   . Coronary artery disease    chronically occluded LAD and diagonal with right to left collaterals, mild disease in the left circ and moderate disease in the mid RCA on medical management with Imdur, ASA, and  Plavix.  . Dermatitis 05/2014  . DIABETES MELLITUS, TYPE II 06/24/2007   No diab retpthy as of 08/05/15 eye exam.  . Esophageal cancer ([place] 04/2016  poorly differentiated carcinoma (Dr. Pyrtle--EGD).  CT C/A/P showed metastatic adenopathy in mediastinum and upper abdomen 05/09/16.  Tx plan is palliative radiation (completed 06/22/16), then palliative systemic chemotherapy (carbo+taxol) was started but as of 08/03/16 onc f/u this was held due to severe knee and ankle arthralgias.  Restarting as of 10/2015 (08/2016 CT showed some disease regression  . Esophageal cancer ([place])    CT 12/2016 showed no residual esoph mass--plan to continue chemo.  Ongoing palliative chemo with carboplatin and taxol q 2 wks as of 03/2017 onc f/u (CT 12/2016 and 04/2017 showed no progression of dz).  Taking a 2 mo break from chemo as of 05/2017.  Pleural effusion-- improved with lasix but onc wanted him to get dx/therap thoracentesis-pt declined.  CT C/A/P no progression/recurr/mets 11/2017.  Marland Kitchen Esophageal cancer ([place])    11/2018 surveillance CTs showed no evidence of dz recurrence.  . Essential hypertension 05/01/2007   Qualifier: Diagnosis of  By: Tiney Rouge CMA, Ellison Hughs     . Herpes zoster 12/2018   Valtrex  . History of kidney stones   . Hyperkalemia 12/2015   Decreased ACE-I by 50% in response, then potassium normalized.  Marland Kitchen HYPERLIPIDEMIA 06/24/2007   03/2018 lipids at goal  . Morbid obesity ([place])   . Obesity hypoventilation syndrome (HCC)    oxygen 24/7 (2 liters [place] as of 02/2016)  . On home oxygen therapy    Oxygen @ 2l/m nasally 24/7 hours  . OSA (obstructive sleep apnea)    not tested; pt scored 4 per stop bang tool results sent to PCP   . OSTEOARTHRITIS 05/01/2007  . Pancytopenia due to chemotherapy ([place]) 2018  . Pleural effusion on right 08/2017   ? malignant vs CHF: pt refuses diagnostic thoracentesis b/c he states he feels fine, wants to try diuretic first.  . Pleural effusion on right 09/2017; 2019/2020    Thoracentesis.  Most recent thoracentesis 12/2018->no malignancy.  . Presbycusis of both ears 05/2015   [facility]  . Pruritic condition 05/2014   Allergist summer 2015, no new testing.  Marland Kitchen RBBB   . Visual field defect 05/10/2016   Per Dr. Melina Fiddler, O.D.: Rt, loss of inf/temp quad and some loss sup/temp.  ?CVA  ? Pituitary tumor? 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    SURGICAL HISTORY: Past Surgical History:  Procedure Laterality Date  . APPENDECTOMY    . BALLOON DILATION N/A 05/04/2016   Procedure: BALLOON DILATION;  Surgeon: Jerene Bears, MD;  Location: [facility];  Service: Gastroenterology;  Laterality: N/A;  . BIOPSY  03/19/2018   Procedure: BIOPSY;  Surgeon: Jackquline Denmark, MD;  Location: [facility];  Service: Endoscopy;;  . CARDIAC CATHETERIZATION    . CARPAL TUNNEL RELEASE Left   . CATARACT EXTRACTION W/PHACO Right 04/29/2013   Procedure: CATARACT EXTRACTION PHACO AND INTRAOCULAR LENS PLACEMENT (IOC);  Surgeon: Adonis Brook, MD;  Location: [facility];  Service: Ophthalmology;  Laterality: Right;  . CATARACT EXTRACTION, BILATERAL    . COLONOSCOPY W/ POLYPECTOMY  08/2011   Many polyps--all hyperplastic, severe diverticulosis, int hem.  [employer] hemoccult testing via [facility] 06/22/15 NEG  . ESOPHAGOGASTRODUODENOSCOPY N/A 05/04/2016   Procedure: ESOPHAGOGASTRODUODENOSCOPY (EGD);  Surgeon: Jerene Bears, MD;  Location: [facility];  Service: Gastroenterology;  Laterality: N/A;  . ESOPHAGOGASTRODUODENOSCOPY (EGD) WITH PROPOFOL N/A 03/19/2018   Gastric ulcers, esoph ulcer (radiation-induced?), bx-->path neg for malignancy or Barrett's.  GI plans repeat EGD 6-8 wks after 1st.   Procedure: ESOPHAGOGASTRODUODENOSCOPY (  EGD) WITH PROPOFOL;  Surgeon: Jackquline Denmark, MD;  [facility];  Service: Endoscopy;  Laterality: N/A;  . ESOPHAGOGASTRODUODENOSCOPY (EGD) WITH PROPOFOL N/A 04/28/2018   Esoph ulcer healed.  Grade I and small (< 5 mm) distal esophageal varices.   Mild portal hypertensive gastropathy.  No evidence of malignance.  Procedure: ESOPHAGOGASTRODUODENOSCOPY (EGD) WITH PROPOFOL;  Surgeon: Jerene Bears, MD;  [facility];  Service: Gastroenterology;  Laterality: N/A;  . IR GENERIC HISTORICAL  10/03/2016   IR US GUIDE VASC ACCESS RIGHT 10/03/2016 Greggory Keen, MD [facility]  . IR GENERIC HISTORICAL  10/03/2016   IR FLUORO GUIDE PORT INSERTION RIGHT 10/03/2016 Greggory Keen, MD [facility]  . KNEE ARTHROSCOPY WITH MEDIAL MENISECTOMY Right 12/16/2014   Procedure: RIGHT KNEE ARTHROSCOPY WITH MEDIAL MENISECTOMY microfracture medial femoral condyle abrasion condroplasty medial femoral condyle lateral menisectomy;  Surgeon: Tobi Bastos, MD;  [facility];  Service: Orthopedics;  Laterality: Right;  . LEFT HEART CATHETERIZATION WITH CORONARY ANGIOGRAM N/A 10/26/2013   Procedure: LEFT HEART CATHETERIZATION WITH CORONARY ANGIOGRAM;  Surgeon: Jettie Booze, MD;  Location: [facility];  Service: Cardiovascular;  Laterality: N/A;  . LUMBAR [place] SURGERY  2001  . RIGHT/LEFT HEART CATH AND CORONARY ANGIOGRAPHY N/A 12/17/2018   Progression of LAD dz, not amenable to PCI. R heart cath with normal pressures.  Procedure: RIGHT/LEFT HEART CATH AND CORONARY ANGIOGRAPHY;  Surgeon: Leonie Man, MD;  Location: [facility];  Service: Cardiovascular;  Laterality: N/A;  . SHOULDER SURGERY Right    right fx  . THORACENTESIS  12/2018   Right->no malignancy  . TONSILLECTOMY    . TRANSTHORACIC ECHOCARDIOGRAM  03/2016; 11/25/2018   2017: EF 55-60%, grade I DD, LAE.  Jan 2020->new anteroapical akinesis, EF 40-45%-->cath 11/2018 showed no acute changes (progression of LAD dz->this explains the [facility] seen on recent echo.  . WRIST SURGERY Right    right fx    I have reviewed the social history and family history with the patient and they are unchanged from previous note.  ALLERGIES:  has No Known Allergies.  MEDICATIONS:  Current Outpatient  Medications  Medication Sig Dispense Refill  . acetaminophen (TYLENOL) 500 MG tablet Take 1,000 mg by mouth every 6 (six) hours as needed for moderate pain.    Marland Kitchen atorvastatin (LIPITOR) 80 MG tablet Take 1 tablet by mouth once daily 90 tablet 0  . baclofen (LIORESAL) 10 MG tablet TAKE 1/2 TO 1 (ONE-HALF TO ONE) TABLET BY MOUTH EVERY 8 HOURS AS NEEDED FOR MUSCLE SPASM 30 tablet 2  . beta carotene w/minerals (OCUVITE) tablet Take 1 tablet by mouth daily.    . cetirizine (ZYRTEC) 10 MG tablet Take 10 mg by mouth every evening.     . citalopram (CELEXA) 20 MG tablet Take 1 tablet by mouth once daily 90 tablet 1  . clonazePAM (KLONOPIN) 1 MG tablet Take 1 tablet by mouth twice daily as needed for anxiety 60 tablet 5  . clotrimazole-betamethasone (LOTRISONE) cream APPLY  CREAM TOPICALLY TO AFFECTED AREA TWICE DAILY 45 g 0  . ezetimibe (ZETIA) 10 MG tablet Take 1 tablet by mouth once daily 90 tablet 2  . furosemide (LASIX) 40 MG tablet Take 1 tablet every other day alternating with a 1/2 tablet every other day. 90 tablet 3  . gabapentin (NEURONTIN) 300 MG capsule TAKE 1 CAPSULE BY MOUTH THREE TIMES DAILY 270 capsule 3  . HUMULIN N KWIKPEN 100 UNIT/ML Kiwkpen INJECT 16 UNITS SUBCUTANEOUSLY IN THE MORNING  AND 16 IN THE EVENING 30 mL 0  . hydrOXYzine (ATARAX/VISTARIL) 25 MG tablet TAKE ONE TABLET BY MOUTH AT SUPPER AND 1 TABLET AT BEDTIME FOR INSOMNIA 60 tablet 6  . Insulin Lispro (HUMALOG KWIKPEN) 200 UNIT/ML SOPN Inject 20 Units into the skin 2 (two) times daily. 18 pen 1  . losartan (COZAAR) 25 MG tablet Take 1 tablet (25 mg total) by mouth daily. 90 tablet 3  . magnesium chloride (SLOW-MAG) 64 MG TBEC SR tablet Take 1 tablet (64 mg total) by mouth daily. 60 tablet 0  . metFORMIN (GLUCOPHAGE) 1000 MG tablet TAKE 1 TABLET BY MOUTH TWICE DAILY WITH MEALS 180 tablet 0  . metoprolol tartrate (LOPRESSOR) 25 MG tablet Take 0.5 tablets (12.5 mg total) by mouth 2 (two) times daily. 90 tablet 1  . Omega-3 Fatty  Acids (FISH OIL) 1200 MG CAPS Take 2,400 mg by mouth 2 (two) times daily.    . ONE TOUCH ULTRA TEST test strip USE 1 STRIP TO CHECK GLUCOSE THREE TIMES DAILY AS DIRECTED 100 each 11  . ONETOUCH DELICA LANCETS 16X MISC USE   TO CHECK GLUCOSE THREE TIMES DAILY 100 each 11  . OXYGEN Inhale 2 L into the lungs continuous.     . pantoprazole (PROTONIX) 40 MG tablet TAKE 1 TABLET BY MOUTH TWICE DAILY 180 tablet 2   No current facility-administered medications for this visit.     PHYSICAL EXAMINATION: ECOG PERFORMANCE STATUS: 2 - Symptomatic, <50% confined to bed  Vitals:   06/05/19 1352  BP: (!) 106/58  Pulse: 73  Resp: 18  Temp: 98.3 F (36.8 C)  SpO2: 90%   Filed Weights   06/05/19 1352  Weight: 263 lb 11.2 oz (119.6 kg)    GENERAL:alert, no distress and comfortable SKIN: skin color, texture, turgor are normal (+) old skin rash of LE EYES: normal, Conjunctiva are pink and non-injected, sclera clear  NECK: supple, thyroid normal size, non-tender, without nodularity LYMPH:  no palpable lymphadenopathy in the cervical, axillary  LUNGS: clear to auscultation and percussion with normal breathing effort, decreased breath sound on both lung base, more on right  HEART: regular rate & rhythm and no murmurs and no lower extremity edema ABDOMEN:abdomen soft, non-tender and normal bowel sounds Musculoskeletal:no cyanosis of digits and no clubbing  NEURO: alert & oriented x 3 with fluent speech, no focal motor/sensory deficits  LABORATORY DATA:  I have reviewed the data as listed CBC Latest Ref Rng & Units 06/05/2019 12/19/2018 12/17/2018  WBC 4.0 - 10.5 K/uL 7.1 7.0 -  Hemoglobin 13.0 - 17.0 g/dL 9.5(L) 10.2(L) 10.9(L)  Hematocrit 39.0 - 52.0 % 33.2(L) 33.8(L) 32.0(L)  Platelets 150 - 400 K/uL 208 160 -     CMP Latest Ref Rng & Units 06/05/2019 06/04/2019 01/06/2019  Glucose 70 - 99 mg/dL 173(H) 109(H) -  BUN 8 - 23 mg/dL 20 16 -  Creatinine 0.61 - 1.24 mg/dL 1.06 0.88 -  Sodium 135 - 145  mmol/L 138 137 -  Potassium 3.5 - 5.1 mmol/L 4.0 4.4 -  Chloride 98 - 111 mmol/L 85(L) 83(L) -  CO2 22 - 32 mmol/L 38(H) 40(H) -  Calcium 8.9 - 10.3 mg/dL 8.5(L) 8.7 -  Total Protein 6.5 - 8.1 g/dL 7.5 - 7.8  Total Bilirubin 0.3 - 1.2 mg/dL 0.6 - -  Alkaline Phos 38 - 126 U/L 84 - -  AST 15 - 41 U/L 24 - -  ALT 0 - 44 U/L 17 17 -  RADIOGRAPHIC STUDIES: I have personally reviewed the radiological images as listed and agreed with the findings in the report. No results found.   ASSESSMENT & PLAN:  [name] is a 73 y.o. male with   1. Cancer of cardio-esophageal junction, poorly differentiated carcinoma, cTxN2M1 with node metastasis, stage IV  -He was diagnosed in 04/2016. He is s/p palliative radiation and first-line Chemo carbo and taxol -His tumor was negative for PD-L1, not a candidate for immunotherapy  -He has been off chemo since 05/2017, clinically stable  -he has developed low appetite and weight loss lately  -I reviewed his 11/2018 angio chest he had during his ED visit which showed nodular disease in peritoneum in LUQ which was suspicious of disease spread.   -He is clinically stable except for recent 10 pounds weight loss from lower appetite and also having fluctuating diarrhea/constipation. I will order CT CAP to be done in 1-2 weeks. He is agreeable.   -Labs reviewed today, CBC and CMP WNL except Hg 9.5, BG 173, CO2 38. His physical exam today was unremarkable.  -F/u in 2 weeks with virtual visit to discuss scan results.    2. Anemia, history of upper GI bleeding from gastric ulcer -Related to recent GI Bleeding from gastric ulcer found on 03/19/18 Upper endoscopy -He was given blood transfusion on 03/19/18  -mild and stable, Hg at 9.5 today (06/05/19/20)  3. Bilateral plural effusion, R>L  -Possibly related to his CHF, he was on furosemide 10 mg daily before chemo, which has been held since he started chemo.  -CT angio chest from 12/19/18 shows b/l pleural  infusion progressed and mild enlargement of mid chest LN. Will monitor LN.  -He had thoracentesis in 12/2018 which removed 745m fluid. Cytology was negative for malignancy.  -He will follow up with Pulmonologist first. continue lasix -will obtain a Chest Xray today to determine if he needs another thoracentesis. He is agreeable.   4. CAD, diastolic CHF, on continuous oxygen -He will continue follow-up with his cardiologist Dr. TRadford Pax-His Plavix has been held since the EGD and biopsy, he is on baby aspirin. -On 2 Liters of continuous oxygen [place].  -He underwent cardiac cath on 12/17/18, results were good.  -Stable.   5. DM, HTN  -He will continue medication, monitor his sugar and blood pressure at home, and follow-up with his primary care physician. -previously decreased premed dexa before taxol  -DM and HTN Better controlled recently  -BP at 106/58 today (06/05/19)  6. OSA, morbid obesity -We previously discussed healthy diet, I previously encouraged him to be more physically active, and consider home PT -previously encouraged him to follow-up with dietitian during the therapy for nutrition support.  7. Arthralgia -He has baseline osteoarthritis, not physically active -He will use oxycodone 5 mg as needed, constipation management previously reviewed with him. -He is no longer taking aspirin, and his arthritis has not been present lately   8. Liver cirrhosis and ascites  -He had a mild elevated AST in the past. Pt states he has not consumed alcohol in 30 years.  -His prior CT scans showed evidence of liver cirrhosis and also showed mild to moderate ascites -He had no known history of liver disease, this could be related to fatty liver or congestive heart failure.  -Continue diuretics, and follow-up   9. Goal of care discussion, DNR/DNI -We previously discussed the incurable nature of his cancer, and the overall poor prognosis, and he agreed with DNR/DNI -however he has been  NED for 1.5 yeas for now it will be reasonable to rever his code status to full code if needed    PLAN -Chest Xray today showed moderate bilateral effusion, will order R thoracentesis next week  -CT CAP W Contrast in 1-2 weeks  -virtual visit in 2 weeks  -I called and updated his daughter above the above   No problem-specific Assessment & Plan notes found for this encounter.   Orders Placed This Encounter  Procedures  . CT Abdomen Pelvis W Contrast    Standing Status:   Future    Standing Expiration Date:   06/04/2020    Order Specific Question:   If indicated for the ordered procedure, I authorize the administration of contrast media per Radiology protocol    Answer:   Yes    Order Specific Question:   Preferred imaging location?    Answer:   [facility]    Order Specific Question:   Is Oral Contrast requested for this exam?    Answer:   Yes, Per Radiology protocol    Order Specific Question:   Radiology Contrast Protocol - do NOT remove file path    Answer:   \\[email or web address]\[email or web address]\Radiant\CTProtocols.pdf  . DG Chest 2 View    Standing Status:   Future    Number of Occurrences:   1    Standing Expiration Date:   06/04/2020    Order Specific Question:   Reason for Exam (SYMPTOM  OR DIAGNOSIS REQUIRED)    Answer:   evaluate pleural effusion    Order Specific Question:   Preferred imaging location?    Answer:   [facility]    Order Specific Question:   Radiology Contrast Protocol - do NOT remove file path    Answer:   \\[email or web address]\Radiant\DXFluoroContrastProtocols.pdf  . US Thoracentesis Asp Pleural space w/IMG guide    Standing Status:   Future    Standing Expiration Date:   06/05/2020    Order Specific Question:   Are labs required for specimen collection?    Answer:   Yes    Order Specific Question:   Lab orders requested (DO NOT place separate lab orders, these will be automatically ordered during procedure specimen collection):    Answer:   Cytology -  Non Pap    Order Specific Question:   Reason for Exam (SYMPTOM  OR DIAGNOSIS REQUIRED)    Answer:   symptom relieve, rule out malignant pleural effusion    Order Specific Question:   Preferred imaging location?    Answer:   [facility]   All questions were answered. The patient knows to call the clinic with any problems, questions or concerns. No barriers to learning was detected. I spent 20 minutes counseling the patient face to face. The total time spent in the appointment was 25 minutes and more than 50% was on counseling and review of test results     Truitt Merle, MD 06/05/2019   I, Joslyn Devon, am acting as scribe for Truitt Merle, MD.   I have reviewed the above documentation for accuracy and completeness, and I agree with the above.

## 2019-06-04 ENCOUNTER — Other Ambulatory Visit: Payer: Self-pay

## 2019-06-04 ENCOUNTER — Other Ambulatory Visit: Payer: Medicare Other | Admitting: *Deleted

## 2019-06-04 DIAGNOSIS — I5032 Chronic diastolic (congestive) heart failure: Secondary | ICD-10-CM | POA: Diagnosis not present

## 2019-06-04 DIAGNOSIS — E1169 Type 2 diabetes mellitus with other specified complication: Secondary | ICD-10-CM

## 2019-06-04 DIAGNOSIS — E118 Type 2 diabetes mellitus with unspecified complications: Secondary | ICD-10-CM

## 2019-06-04 DIAGNOSIS — I2510 Atherosclerotic heart disease of native coronary artery without angina pectoris: Secondary | ICD-10-CM

## 2019-06-04 DIAGNOSIS — E1151 Type 2 diabetes mellitus with diabetic peripheral angiopathy without gangrene: Secondary | ICD-10-CM

## 2019-06-04 DIAGNOSIS — E785 Hyperlipidemia, unspecified: Secondary | ICD-10-CM | POA: Diagnosis not present

## 2019-06-04 LAB — LIPID PANEL
Chol/HDL Ratio: 2.2 ratio (ref 0.0–5.0)
Cholesterol, Total: 96 mg/dL — ABNORMAL LOW (ref 100–199)
HDL: 44 mg/dL (ref >39)
LDL Calculated: 30 mg/dL (ref 0–99)
Triglycerides: 110 mg/dL (ref 0–149)
VLDL Cholesterol Cal: 22 mg/dL (ref 5–40)

## 2019-06-04 LAB — BASIC METABOLIC PANEL
BUN/Creatinine Ratio: 18 (ref 10–24)
BUN: 16 mg/dL (ref 8–27)
CO2: 40 mmol/L — ABNORMAL HIGH (ref 20–29)
Calcium: 8.7 mg/dL (ref 8.6–10.2)
Chloride: 83 mmol/L — ABNORMAL LOW (ref 96–106)
Creatinine, Ser: 0.88 mg/dL (ref 0.76–1.27)
GFR calc Af Amer: 99 mL/min/{1.73_m2} (ref >59)
GFR calc non Af Amer: 85 mL/min/{1.73_m2} (ref >59)
Glucose: 109 mg/dL — ABNORMAL HIGH (ref 65–99)
Potassium: 4.4 mmol/L (ref 3.5–5.2)
Sodium: 137 mmol/L (ref 134–144)

## 2019-06-04 LAB — ALT: ALT: 17 IU/L (ref 0–44)

## 2019-06-05 ENCOUNTER — Ambulatory Visit (HOSPITAL_COMMUNITY)
Admission: RE | Admit: 2019-06-05 | Discharge: 2019-06-05 | Disposition: A | Discharge status: Home / Self Care | Payer: Medicare Other | Source: Ambulatory Visit | Attending: Hematology | Admitting: Hematology

## 2019-06-05 ENCOUNTER — Telehealth: Payer: Self-pay

## 2019-06-05 ENCOUNTER — Other Ambulatory Visit: Payer: Self-pay

## 2019-06-05 ENCOUNTER — Inpatient Hospital Stay: Payer: Medicare Other

## 2019-06-05 ENCOUNTER — Inpatient Hospital Stay (HOSPITAL_BASED_OUTPATIENT_CLINIC_OR_DEPARTMENT_OTHER): Payer: Medicare Other | Admitting: Hematology

## 2019-06-05 ENCOUNTER — Inpatient Hospital Stay: Payer: Medicare Other | Attending: Hematology

## 2019-06-05 ENCOUNTER — Other Ambulatory Visit: Payer: Medicare Other

## 2019-06-05 VITALS — BP 106/58 | HR 73 | Temp 98.3°F | Resp 18 | Ht 68.0 in | Wt 263.7 lb

## 2019-06-05 DIAGNOSIS — I10 Essential (primary) hypertension: Secondary | ICD-10-CM

## 2019-06-05 DIAGNOSIS — Z8501 Personal history of malignant neoplasm of esophagus: Secondary | ICD-10-CM | POA: Diagnosis not present

## 2019-06-05 DIAGNOSIS — N183 Chronic kidney disease, stage 3 (moderate): Secondary | ICD-10-CM | POA: Insufficient documentation

## 2019-06-05 DIAGNOSIS — Z923 Personal history of irradiation: Secondary | ICD-10-CM | POA: Diagnosis not present

## 2019-06-05 DIAGNOSIS — C160 Malignant neoplasm of cardia: Secondary | ICD-10-CM

## 2019-06-05 DIAGNOSIS — J90 Pleural effusion, not elsewhere classified: Secondary | ICD-10-CM | POA: Diagnosis not present

## 2019-06-05 DIAGNOSIS — R634 Abnormal weight loss: Secondary | ICD-10-CM | POA: Insufficient documentation

## 2019-06-05 DIAGNOSIS — Z9981 Dependence on supplemental oxygen: Secondary | ICD-10-CM | POA: Diagnosis not present

## 2019-06-05 DIAGNOSIS — I5032 Chronic diastolic (congestive) heart failure: Secondary | ICD-10-CM | POA: Insufficient documentation

## 2019-06-05 DIAGNOSIS — D649 Anemia, unspecified: Secondary | ICD-10-CM | POA: Insufficient documentation

## 2019-06-05 DIAGNOSIS — I130 Hypertensive heart and chronic kidney disease with heart failure and stage 1 through stage 4 chronic kidney disease, or unspecified chronic kidney disease: Secondary | ICD-10-CM | POA: Insufficient documentation

## 2019-06-05 DIAGNOSIS — K2210 Ulcer of esophagus without bleeding: Secondary | ICD-10-CM | POA: Insufficient documentation

## 2019-06-05 DIAGNOSIS — E119 Type 2 diabetes mellitus without complications: Secondary | ICD-10-CM

## 2019-06-05 DIAGNOSIS — Z9221 Personal history of antineoplastic chemotherapy: Secondary | ICD-10-CM | POA: Insufficient documentation

## 2019-06-05 DIAGNOSIS — E1122 Type 2 diabetes mellitus with diabetic chronic kidney disease: Secondary | ICD-10-CM | POA: Diagnosis not present

## 2019-06-05 DIAGNOSIS — J449 Chronic obstructive pulmonary disease, unspecified: Secondary | ICD-10-CM | POA: Diagnosis not present

## 2019-06-05 DIAGNOSIS — Z87442 Personal history of urinary calculi: Secondary | ICD-10-CM | POA: Insufficient documentation

## 2019-06-05 DIAGNOSIS — Z794 Long term (current) use of insulin: Secondary | ICD-10-CM | POA: Diagnosis not present

## 2019-06-05 DIAGNOSIS — K7460 Unspecified cirrhosis of liver: Secondary | ICD-10-CM | POA: Diagnosis not present

## 2019-06-05 DIAGNOSIS — R188 Other ascites: Secondary | ICD-10-CM | POA: Insufficient documentation

## 2019-06-05 DIAGNOSIS — Z79899 Other long term (current) drug therapy: Secondary | ICD-10-CM | POA: Insufficient documentation

## 2019-06-05 DIAGNOSIS — Z95828 Presence of other vascular implants and grafts: Secondary | ICD-10-CM

## 2019-06-05 DIAGNOSIS — I2510 Atherosclerotic heart disease of native coronary artery without angina pectoris: Secondary | ICD-10-CM | POA: Diagnosis not present

## 2019-06-05 LAB — CBC WITH DIFFERENTIAL/PLATELET
Abs Immature Granulocytes: 0.03 10*3/uL (ref 0.00–0.07)
Basophils Absolute: 0 10*3/uL (ref 0.0–0.1)
Basophils Relative: 0 %
Eosinophils Absolute: 0.2 10*3/uL (ref 0.0–0.5)
Eosinophils Relative: 3 %
HCT: 33.2 % — ABNORMAL LOW (ref 39.0–52.0)
Hemoglobin: 9.5 g/dL — ABNORMAL LOW (ref 13.0–17.0)
Immature Granulocytes: 0 %
Lymphocytes Relative: 11 %
Lymphs Abs: 0.8 10*3/uL (ref 0.7–4.0)
MCH: 27.2 pg (ref 26.0–34.0)
MCHC: 28.6 g/dL — ABNORMAL LOW (ref 30.0–36.0)
MCV: 95.1 fL (ref 80.0–100.0)
Monocytes Absolute: 0.9 10*3/uL (ref 0.1–1.0)
Monocytes Relative: 13 %
Neutro Abs: 5.2 10*3/uL (ref 1.7–7.7)
Neutrophils Relative %: 73 %
Platelets: 208 10*3/uL (ref 150–400)
RBC: 3.49 MIL/uL — ABNORMAL LOW (ref 4.22–5.81)
RDW: 19.7 % — ABNORMAL HIGH (ref 11.5–15.5)
WBC: 7.1 10*3/uL (ref 4.0–10.5)
nRBC: 0.4 % — ABNORMAL HIGH (ref 0.0–0.2)

## 2019-06-05 LAB — CMP (CANCER CENTER ONLY)
ALT: 17 U/L (ref 0–44)
AST: 24 U/L (ref 15–41)
Albumin: 2.7 g/dL — ABNORMAL LOW (ref 3.5–5.0)
Alkaline Phosphatase: 84 U/L (ref 38–126)
Anion gap: 15 (ref 5–15)
BUN: 20 mg/dL (ref 8–23)
CO2: 38 mmol/L — ABNORMAL HIGH (ref 22–32)
Calcium: 8.5 mg/dL — ABNORMAL LOW (ref 8.9–10.3)
Chloride: 85 mmol/L — ABNORMAL LOW (ref 98–111)
Creatinine: 1.06 mg/dL (ref 0.61–1.24)
GFR, Est AFR Am: >60 mL/min (ref >60)
GFR, Estimated: >60 mL/min (ref >60)
Glucose, Bld: 173 mg/dL — ABNORMAL HIGH (ref 70–99)
Potassium: 4 mmol/L (ref 3.5–5.1)
Sodium: 138 mmol/L (ref 135–145)
Total Bilirubin: 0.6 mg/dL (ref 0.3–1.2)
Total Protein: 7.5 g/dL (ref 6.5–8.1)

## 2019-06-05 MED ORDER — SODIUM CHLORIDE 0.9% FLUSH
10.0000 mL | INTRAVENOUS | Status: DC | PRN
  Administered 2019-06-05: 10 mL
  Filled 2019-06-05: qty 10

## 2019-06-05 MED ORDER — FUROSEMIDE 40 MG PO TABS: ORAL_TABLET | ORAL | 3 refills | Status: AC

## 2019-06-05 MED ORDER — HEPARIN SOD (PORK) LOCK FLUSH 100 UNIT/ML IV SOLN
500.0000 [IU] | Freq: Once | INTRAVENOUS | Status: AC | PRN
  Administered 2019-06-05: 14:00:00 500 [IU]
  Filled 2019-06-05: qty 5

## 2019-06-05 NOTE — Telephone Encounter (Signed)
Call placed to Pt.  Spoke with daughter per DPR.  New order for lasix given.  Daughter states she can bring Pt for lab work on August 14.    Orders entered and lab appt scheduled.

## 2019-06-05 NOTE — Telephone Encounter (Signed)
-----   Message from Sueanne Margarita, MD sent at 06/04/2019  4:54 PM EDT ----- Lipids look good but Cl low and CO2 elevated.  Please decrease lasix to 40mg  qod alternating with 20mg  qod and repeat BMET in 1 week

## 2019-06-06 ENCOUNTER — Encounter: Payer: Self-pay | Admitting: Hematology

## 2019-06-08 ENCOUNTER — Telehealth: Payer: Self-pay

## 2019-06-08 ENCOUNTER — Other Ambulatory Visit: Payer: Self-pay

## 2019-06-08 ENCOUNTER — Other Ambulatory Visit: Payer: Self-pay | Admitting: Medical

## 2019-06-08 DIAGNOSIS — J90 Pleural effusion, not elsewhere classified: Secondary | ICD-10-CM

## 2019-06-08 DIAGNOSIS — J449 Chronic obstructive pulmonary disease, unspecified: Secondary | ICD-10-CM | POA: Diagnosis not present

## 2019-06-08 DIAGNOSIS — C159 Malignant neoplasm of esophagus, unspecified: Secondary | ICD-10-CM

## 2019-06-08 NOTE — Telephone Encounter (Signed)
Spoke to patient's daughter about chest x -ray results.  Showed Moderate bilateral pleural effusions.  Per Dr. Burr Medico want to get a thoracentesis done and we have scheduled it for Thursday 8/13 at10:00 arrive at 9:45 at [facility].  He will need to go to [facility] location tomorrow for COVID test.  Has to have this done prior to thoracentesis.  She verbalized an understanding.  Also waiting on PA for CT, will get that scheduled as soon as approved.

## 2019-06-08 NOTE — Progress Notes (Deleted)
Cardiology Office Note   Date:  06/08/2019   ID:  Dajaun, Goldring June 29, 1946, MRN [id number]  PCP:  Tammi Sou, MD  Cardiologist:  Dr. Radford Pax    No chief complaint on file.     History of Present Illness: [name] is a 73 y.o. male who presents for to eval BP and perhaps add spironolactone.    a hx of obesity hypoventilation syndrome on home O2, HTN, dyslipidemia, morbid obesity and chronic LE edema which is controlled with TED hose stockings and diuretic. He has chronic diastolic CHF controlled on diuretics. He alsohas ASCAD with severely diseased and chronically occluded LAD and diagonal with right to left collaterals, mild disease in the left circ and moderate disease in the mid RCA on medical management with Imdur and Plavix.He also has a history of adenoCA of the GE junction treated with Chemo.He is DNR/DNI.   At [place] in 11/2018 he complained of SOB and 2D echo showed new LV dysfunction with EF 40-45% with severe hypokinesis of the apical, anterior septal, anterior and apical left ventricular segments.  He underwent R&L heart cath showing:  Follow up R & L cath as below 12/17/18:   Prox LAD lesion is 80% stenosed with 99% stenosed side branch in Ost 1st Diag.  Prox LAD to Mid LAD lesion is 100% stenosed (CTO with bridging collaterals -- Mid LAD to Dist LAD lesion is 90% stenosed.)  Prox Cx lesion is 40% stenosed.  Right Heart Cath (RAP-RVP-PA P-PCWP) and LV EDP are all relatively normal  SUMMARY  Essentially stable severe LAD diagonal disease with minimal change from last procedure -not PCI amenable  Relatively normal Right Heart Cath Pressures with Wedge Pressure of 11, LV EDP of 14. --PA pressures are not consistent with pulmonary hypertension  RECOMMENDATIONS  Appears euvolemic on exam, is euvolemic on right heart cath. No active CHF symptoms.  Likely cause for worsening anterior wall motion is simply progression of disease. His LAD  has been occluded since 2014. This is not an unexpected wall motion normality. I doubt that this is viable PCI target.  He underwent thoracentesis on the right of 770cc exudative pleural fluid - followed by Dr. Lake Bells.  He is here today for followup and is doing well.  He denies any chest pain or pressure,  PND, orthopnea, LE edema, dizziness, palpitations or syncope. He says that his SOB is stable which is from his chronic lung disease.   He is compliant with his meds and is tolerating meds with no SE.     Past Medical History:  Diagnosis Date  . Acute upper GI bleed 02/2018   Transfused 4 U pRBCs-->EGD showed an esoph ulcer and some gastric ulcers (?radiation-induced), path showed no malignancy or Barrett's.  Rpt EGD 04/28/18: Grade I and small (< 5 mm) distal esophageal varices.  Esoph ulcer healed.  Mild portal hypert gastrop.  No sign of malignancy.  . Asthma    as a child  . CAD in native artery    LAD occluded since 2014->a. cardiac cath 09/2013 showed severely diseased mLAD and diagonal, mild LCx disease, moderate RCA disease, LVEDP 20, EF 50%. 11/2018 cath->progression of LAD dz, not amenable to PCI  . Cataract    multiple types, bilateral  . Cholelithiasis without cholecystitis   . Chronic combined systolic and diastolic CHF (congestive heart failure) ([place]) 02/25/2009   EF 40-45% 2020; cath 11/2018 showed stable chronic occlusion of LAD w/collateral circ.  Since 2020 echo  showed decrease in EF, low dose metop added w/plan to add ACE/ARB  . Chronic renal insufficiency, stage III (moderate) (HCC) 2017   Stage II/III (GFR around 60)  . 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  CT 11/2017 w/ascites and persistent bilat pleural effusions  . 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 COPD (chronic obstructive pulmonary disease) ([place])   . Coronary artery disease    chronically occluded LAD and diagonal with  right to left collaterals, mild disease in the left circ and moderate disease in the mid RCA on medical management with Imdur, ASA, and Plavix.  . Dermatitis 05/2014  . DIABETES MELLITUS, TYPE II 06/24/2007   No diab retpthy as of 08/05/15 eye exam.  . 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 Pyrtle--EGD).  CT C/A/P showed metastatic adenopathy in mediastinum and upper abdomen 05/09/16.  Tx plan is palliative radiation (completed 06/22/16), then palliative systemic chemotherapy (carbo+taxol) was started but as of 08/03/16 onc f/u this was held due to severe knee and ankle arthralgias.  Restarting as of 10/2015 (08/2016 CT showed some disease regression  . Esophageal cancer ([place])    CT 12/2016 showed no residual esoph mass--plan to continue chemo.  Ongoing palliative chemo with carboplatin and taxol q 2 wks as of 03/2017 onc f/u (CT 12/2016 and 04/2017 showed no progression of dz).  Taking a 2 mo break from chemo as of 05/2017.  Pleural effusion-- improved with lasix but onc wanted him to get dx/therap thoracentesis-pt declined.  CT C/A/P no progression/recurr/mets 11/2017.  Marland Kitchen Esophageal cancer ([place])    11/2018 surveillance CTs showed no evidence of dz recurrence.  . Essential hypertension 05/01/2007   Qualifier: Diagnosis of  By: Tiney Rouge CMA, Ellison Hughs     . Herpes zoster 12/2018   Valtrex  . History of kidney stones   . Hyperkalemia 12/2015   Decreased ACE-I by 50% in response, then potassium normalized.  Marland Kitchen HYPERLIPIDEMIA 06/24/2007   03/2018 lipids at goal  . Morbid obesity ([place])   . Obesity hypoventilation syndrome (HCC)    oxygen 24/7 (2 liters [place] as of 02/2016)  . On home oxygen therapy    Oxygen @ 2l/m nasally 24/7 hours  . OSA (obstructive sleep apnea)    not tested; pt scored 4 per stop bang tool results sent to PCP   . OSTEOARTHRITIS 05/01/2007  . Pancytopenia due to chemotherapy ([place]) 2018  . Pleural effusion on right 08/2017   ? malignant vs CHF: pt refuses diagnostic  thoracentesis b/c he states he feels fine, wants to try diuretic first.  . Pleural effusion on right 09/2017; 2019/2020   Thoracentesis.  Most recent thoracentesis 12/2018->no malignancy.  . Presbycusis of both ears 05/2015   [facility]  . Pruritic condition 05/2014   Allergist summer 2015, no new testing.  Marland Kitchen RBBB   . Visual field defect 05/10/2016   Per Dr. Melina Fiddler, O.D.: Rt, loss of inf/temp quad and some loss sup/temp.  ?CVA  ? Pituitary tumor? 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    Past Surgical History:  Procedure Laterality Date  . APPENDECTOMY    . BALLOON DILATION N/A 05/04/2016   Procedure: BALLOON DILATION;  Surgeon: Jerene Bears, MD;  Location: [facility];  Service: Gastroenterology;  Laterality: N/A;  . BIOPSY  03/19/2018   Procedure: BIOPSY;  Surgeon: Jackquline Denmark, MD;  Location: [facility];  Service: Endoscopy;;  . CARDIAC CATHETERIZATION    . CARPAL TUNNEL RELEASE Left   . CATARACT EXTRACTION W/PHACO Right 04/29/2013   Procedure: CATARACT EXTRACTION PHACO AND INTRAOCULAR LENS PLACEMENT (IOC);  Surgeon: Adonis Brook, MD;  Location: [facility];  Service: Ophthalmology;  Laterality: Right;  . CATARACT EXTRACTION, BILATERAL    . COLONOSCOPY W/ POLYPECTOMY  08/2011   Many polyps--all hyperplastic, severe diverticulosis, int hem.  [employer] hemoccult testing via [facility] 06/22/15 NEG  . ESOPHAGOGASTRODUODENOSCOPY N/A 05/04/2016   Procedure: ESOPHAGOGASTRODUODENOSCOPY (EGD);  Surgeon: Jerene Bears, MD;  Location: [facility];  Service: Gastroenterology;  Laterality: N/A;  . ESOPHAGOGASTRODUODENOSCOPY (EGD) WITH PROPOFOL N/A 03/19/2018   Gastric ulcers, esoph ulcer (radiation-induced?), bx-->path neg for malignancy or Barrett's.  GI plans repeat EGD 6-8 wks after 1st.   Procedure: ESOPHAGOGASTRODUODENOSCOPY (EGD) WITH PROPOFOL;  Surgeon: Jackquline Denmark, MD;  [facility];  Service: Endoscopy;  Laterality: N/A;  . ESOPHAGOGASTRODUODENOSCOPY (EGD)  WITH PROPOFOL N/A 04/28/2018   Esoph ulcer healed.  Grade I and small (< 5 mm) distal esophageal varices.  Mild portal hypertensive gastropathy.  No evidence of malignance.  Procedure: ESOPHAGOGASTRODUODENOSCOPY (EGD) WITH PROPOFOL;  Surgeon: Jerene Bears, MD;  [facility];  Service: Gastroenterology;  Laterality: N/A;  . IR GENERIC HISTORICAL  10/03/2016   IR US GUIDE VASC ACCESS RIGHT 10/03/2016 Greggory Keen, MD [facility]  . IR GENERIC HISTORICAL  10/03/2016   IR FLUORO GUIDE PORT INSERTION RIGHT 10/03/2016 Greggory Keen, MD [facility]  . KNEE ARTHROSCOPY WITH MEDIAL MENISECTOMY Right 12/16/2014   Procedure: RIGHT KNEE ARTHROSCOPY WITH MEDIAL MENISECTOMY microfracture medial femoral condyle abrasion condroplasty medial femoral condyle lateral menisectomy;  Surgeon: Tobi Bastos, MD;  [facility];  Service: Orthopedics;  Laterality: Right;  . LEFT HEART CATHETERIZATION WITH CORONARY ANGIOGRAM N/A 10/26/2013   Procedure: LEFT HEART CATHETERIZATION WITH CORONARY ANGIOGRAM;  Surgeon: Jettie Booze, MD;  Location: [facility] C[facility] LAB;  Service: Cardiovascular;  Laterality: N/A;  . LUMBAR [place] SURGERY  2001  . RIGHT/LEFT HEART CATH AND CORONARY ANGIOGRAPHY N/A 12/17/2018   Progression of LAD dz, not amenable to PCI. R heart cath with normal pressures.  Procedure: RIGHT/LEFT HEART CATH AND CORONARY ANGIOGRAPHY;  Surgeon: Leonie Man, MD;  Location: [facility] CV LAB;  Service: Cardiovascular;  Laterality: N/A;  . SHOULDER SURGERY Right    right fx  . THORACENTESIS  12/2018   Right->no malignancy  . TONSILLECTOMY    . TRANSTHORACIC ECHOCARDIOGRAM  03/2016; 11/25/2018   2017: EF 55-60%, grade I DD, LAE.  Jan 2020->new anteroapical akinesis, EF 40-45%-->cath 11/2018 showed no acute changes (progression of LAD dz->this explains the [facility] seen on recent echo.  . WRIST SURGERY Right    right fx     Current Outpatient Medications  Medication Sig Dispense Refill  . acetaminophen  (TYLENOL) 500 MG tablet Take 1,000 mg by mouth every 6 (six) hours as needed for moderate pain.    Marland Kitchen atorvastatin (LIPITOR) 80 MG tablet Take 1 tablet by mouth once daily 90 tablet 0  . baclofen (LIORESAL) 10 MG tablet TAKE 1/2 TO 1 (ONE-HALF TO ONE) TABLET BY MOUTH EVERY 8 HOURS AS NEEDED FOR MUSCLE SPASM 30 tablet 2  . beta carotene w/minerals (OCUVITE) tablet Take 1 tablet by mouth daily.    . cetirizine (ZYRTEC) 10 MG tablet Take 10 mg by mouth every evening.     . citalopram (CELEXA) 20 MG tablet Take 1 tablet by mouth once  daily 90 tablet 1  . clonazePAM (KLONOPIN) 1 MG tablet Take 1 tablet by mouth twice daily as needed for anxiety 60 tablet 5  . clotrimazole-betamethasone (LOTRISONE) cream APPLY  CREAM TOPICALLY TO AFFECTED AREA TWICE DAILY 45 g 0  . ezetimibe (ZETIA) 10 MG tablet Take 1 tablet by mouth once daily 90 tablet 2  . furosemide (LASIX) 40 MG tablet Take 1 tablet every other day alternating with a 1/2 tablet every other day. 90 tablet 3  . gabapentin (NEURONTIN) 300 MG capsule TAKE 1 CAPSULE BY MOUTH THREE TIMES DAILY 270 capsule 3  . HUMULIN N KWIKPEN 100 UNIT/ML Kiwkpen INJECT 16 UNITS SUBCUTANEOUSLY IN THE MORNING AND 16 IN THE EVENING 30 mL 0  . hydrOXYzine (ATARAX/VISTARIL) 25 MG tablet TAKE ONE TABLET BY MOUTH AT SUPPER AND 1 TABLET AT BEDTIME FOR INSOMNIA 60 tablet 6  . Insulin Lispro (HUMALOG KWIKPEN) 200 UNIT/ML SOPN Inject 20 Units into the skin 2 (two) times daily. 18 pen 1  . losartan (COZAAR) 25 MG tablet Take 1 tablet (25 mg total) by mouth daily. 90 tablet 3  . magnesium chloride (SLOW-MAG) 64 MG TBEC SR tablet Take 1 tablet (64 mg total) by mouth daily. 60 tablet 0  . metFORMIN (GLUCOPHAGE) 1000 MG tablet TAKE 1 TABLET BY MOUTH TWICE DAILY WITH MEALS 180 tablet 0  . metoprolol tartrate (LOPRESSOR) 25 MG tablet Take 0.5 tablets (12.5 mg total) by mouth 2 (two) times daily. 90 tablet 1  . Omega-3 Fatty Acids (FISH OIL) 1200 MG CAPS Take 2,400 mg by mouth 2 (two)  times daily.    . ONE TOUCH ULTRA TEST test strip USE 1 STRIP TO CHECK GLUCOSE THREE TIMES DAILY AS DIRECTED 100 each 11  . ONETOUCH DELICA LANCETS 66Y MISC USE   TO CHECK GLUCOSE THREE TIMES DAILY 100 each 11  . OXYGEN Inhale 2 L into the lungs continuous.     . pantoprazole (PROTONIX) 40 MG tablet TAKE 1 TABLET BY MOUTH TWICE DAILY 180 tablet 2   No current facility-administered medications for this visit.     Allergies:   Patient has no known allergies.    Social History:  The patient  reports that he quit smoking about 35 years ago. His smoking use included cigarettes, pipe, and cigars. He has a 45.00 pack-year smoking history. His smokeless tobacco use includes chew. He reports that he does not drink alcohol or use drugs.   Family History:  The patient's ***family history includes Alcohol abuse in his father; Aneurysm in his father; Diabetes in his maternal aunt; Heart attack in his mother.    ROS:  General:no colds or fevers, no weight changes Skin:no rashes or ulcers HEENT:no blurred vision, no congestion CV:see HPI PUL:see HPI GI:no diarrhea constipation or melena, no indigestion GU:no hematuria, no dysuria MS:no joint pain, no claudication Neuro:no syncope, no lightheadedness Endo:no diabetes, no thyroid disease Wt Readings from Last 3 Encounters:  06/05/19 263 lb 11.2 oz (119.6 kg)  05/25/19 271 lb (122.9 kg)  01/06/19 271 lb 3.2 oz (123 kg)     PHYSICAL EXAM: VS:  There were no vitals taken for this visit. , BMI There is no height or weight on file to calculate BMI. General:Pleasant affect, NAD Skin:Warm and dry, brisk capillary refill HEENT:normocephalic, sclera clear, mucus membranes moist Neck:supple, no JVD, no bruits  Heart:S1S2 RRR without murmur, gallup, rub or click Lungs:clear without rales, rhonchi, or wheezes QIH:KVQQ, non tender, + BS, do not palpate liver spleen or masses  Ext:no lower ext edema, 2+ pedal pulses, 2+ radial pulses Neuro:alert and  oriented, MAE, follows commands, + facial symmetry    EKG:  EKG is ordered today. The ekg ordered today demonstrates ***   Recent Labs: 11/20/2018: NT-Pro BNP 90 06/05/2019: ALT 17; BUN 20; Creatinine 1.06; Hemoglobin 9.5; Platelets 208; Potassium 4.0; Sodium 138    Lipid Panel    Component Value Date/Time   CHOL 96 (L) 06/04/2019 0915   TRIG 110 06/04/2019 0915   TRIG 268 (HH) 09/06/2006 1555   HDL 44 06/04/2019 0915   CHOLHDL 2.2 06/04/2019 0915   CHOLHDL 3.6 03/12/2016 1001   VLDL 37 (H) 03/12/2016 1001   LDLCALC 30 06/04/2019 0915   LDLDIRECT 74.0 04/15/2015 0838       Other studies Reviewed: Additional studies/ records that were reviewed today include: ***.   ASSESSMENT AND PLAN:  1.  ***   Current medicines are reviewed with the patient today.  The patient Has no concerns regarding medicines.  The following changes have been made:  See above Labs/ tests ordered today include:see above  Disposition:   FU:  see above  Signed, Cecilie Kicks, NP  06/08/2019 9:48 PM    [facility] [address] [place], [place], [place] Phone: [phone]; Fax: [phone]

## 2019-06-09 ENCOUNTER — Telehealth: Payer: Self-pay | Admitting: Hematology

## 2019-06-09 ENCOUNTER — Other Ambulatory Visit (HOSPITAL_COMMUNITY)
Admission: RE | Admit: 2019-06-09 | Discharge: 2019-06-09 | Disposition: A | Discharge status: Home / Self Care | Payer: Medicare Other | Source: Ambulatory Visit | Attending: Hematology | Admitting: Hematology

## 2019-06-09 ENCOUNTER — Ambulatory Visit: Payer: Medicare Other | Admitting: Cardiology

## 2019-06-09 DIAGNOSIS — Z01812 Encounter for preprocedural laboratory examination: Secondary | ICD-10-CM | POA: Insufficient documentation

## 2019-06-09 DIAGNOSIS — Z20828 Contact with and (suspected) exposure to other viral communicable diseases: Secondary | ICD-10-CM | POA: Diagnosis not present

## 2019-06-09 LAB — SARS CORONAVIRUS 2 (TAT 6-24 HRS): SARS Coronavirus 2: NEGATIVE

## 2019-06-09 NOTE — Telephone Encounter (Signed)
Spoke with patients daughter, delores. Confirmed 8/21 f/u. [facility] Radiology number to schedule CT scan

## 2019-06-11 ENCOUNTER — Other Ambulatory Visit: Payer: Self-pay

## 2019-06-11 ENCOUNTER — Ambulatory Visit (HOSPITAL_COMMUNITY)
Admission: RE | Admit: 2019-06-11 | Discharge: 2019-06-11 | Disposition: A | Discharge status: Home / Self Care | Payer: Medicare Other | Source: Ambulatory Visit | Attending: Physician Assistant | Admitting: Physician Assistant

## 2019-06-11 ENCOUNTER — Ambulatory Visit (HOSPITAL_COMMUNITY)
Admission: RE | Admit: 2019-06-11 | Discharge: 2019-06-11 | Disposition: A | Discharge status: Home / Self Care | Payer: Medicare Other | Source: Ambulatory Visit | Attending: Hematology | Admitting: Hematology

## 2019-06-11 ENCOUNTER — Telehealth: Payer: Self-pay

## 2019-06-11 DIAGNOSIS — J90 Pleural effusion, not elsewhere classified: Secondary | ICD-10-CM

## 2019-06-11 DIAGNOSIS — J948 Other specified pleural conditions: Secondary | ICD-10-CM | POA: Diagnosis not present

## 2019-06-11 DIAGNOSIS — J9811 Atelectasis: Secondary | ICD-10-CM | POA: Diagnosis not present

## 2019-06-11 DIAGNOSIS — C160 Malignant neoplasm of cardia: Secondary | ICD-10-CM

## 2019-06-11 MED ORDER — LIDOCAINE HCL 1 % IJ SOLN
INTRAMUSCULAR | Status: AC
  Filled 2019-06-11: qty 10

## 2019-06-11 NOTE — Telephone Encounter (Signed)
Spoke to patient's daughter regarding scheduled CT scan, this is going to be at [facility] on 8/20 to check in at 3:50 pm, he will need to pick up two bottles of oral prep from there prior to and they will give you instructions.  She verbalized an understanding.

## 2019-06-11 NOTE — Procedures (Signed)
PROCEDURE SUMMARY:  Successful US guided right thoracentesis. Yielded 650 mL of hazy amber fluid. Patient tolerated procedure well. No immediate complications. EBL = trace  Specimen was sent for labs.  Post procedure chest X-ray reveals no pneumothorax  Zeek Rostron S Elloise Roark PA-C 06/11/2019 11:52 AM

## 2019-06-12 ENCOUNTER — Other Ambulatory Visit: Payer: Medicare Other | Admitting: *Deleted

## 2019-06-12 DIAGNOSIS — I5032 Chronic diastolic (congestive) heart failure: Secondary | ICD-10-CM

## 2019-06-12 LAB — BASIC METABOLIC PANEL
BUN/Creatinine Ratio: 15 (ref 10–24)
BUN: 11 mg/dL (ref 8–27)
CO2: 40 mmol/L — ABNORMAL HIGH (ref 20–29)
Calcium: 8.7 mg/dL (ref 8.6–10.2)
Chloride: 86 mmol/L — ABNORMAL LOW (ref 96–106)
Creatinine, Ser: 0.72 mg/dL — ABNORMAL LOW (ref 0.76–1.27)
GFR calc Af Amer: 107 mL/min/{1.73_m2} (ref >59)
GFR calc non Af Amer: 93 mL/min/{1.73_m2} (ref >59)
Glucose: 86 mg/dL (ref 65–99)
Potassium: 5.3 mmol/L — ABNORMAL HIGH (ref 3.5–5.2)
Sodium: 138 mmol/L (ref 134–144)

## 2019-06-14 ENCOUNTER — Other Ambulatory Visit: Payer: Self-pay | Admitting: Physician Assistant

## 2019-06-15 ENCOUNTER — Other Ambulatory Visit: Payer: Self-pay

## 2019-06-15 ENCOUNTER — Telehealth (INDEPENDENT_AMBULATORY_CARE_PROVIDER_SITE_OTHER): Payer: Medicare Other | Admitting: Cardiology

## 2019-06-15 ENCOUNTER — Encounter: Payer: Self-pay | Admitting: Cardiology

## 2019-06-15 VITALS — BP 111/64 | HR 74 | Temp 98.5°F | Ht 68.0 in | Wt 260.0 lb

## 2019-06-15 DIAGNOSIS — I5043 Acute on chronic combined systolic (congestive) and diastolic (congestive) heart failure: Secondary | ICD-10-CM | POA: Diagnosis not present

## 2019-06-15 DIAGNOSIS — I255 Ischemic cardiomyopathy: Secondary | ICD-10-CM

## 2019-06-15 DIAGNOSIS — Z79899 Other long term (current) drug therapy: Secondary | ICD-10-CM

## 2019-06-15 DIAGNOSIS — E785 Hyperlipidemia, unspecified: Secondary | ICD-10-CM

## 2019-06-15 DIAGNOSIS — I10 Essential (primary) hypertension: Secondary | ICD-10-CM

## 2019-06-15 NOTE — Patient Instructions (Signed)
Medication Instructions:  Your physician recommends that you continue on your current medications as directed. Please refer to the Current Medication list given to you today.  If you need a refill on your cardiac medications before your next appointment, please call your pharmacy.   Lab work: 06/17/2019:  COME IN TO THE OFFICE ANYTIME THAT MORNING FOR A BMET  If you have labs (blood work) drawn today and your tests are completely normal, you will receive your results only by: Marland Kitchen MyChart Message (if you have MyChart) OR . A paper copy in the mail If you have any lab test that is abnormal or we need to change your treatment, we will call you to review the results.  Testing/Procedures: None ordered  Follow-Up: At [facility], you and your health needs are our priority.  As part of our continuing mission to provide you with exceptional heart care, we have created designated Provider Care Teams.  These Care Teams include your primary Cardiologist (physician) and Advanced Practice Providers (APPs -  Physician Assistants and Nurse Practitioners) who all work together to provide you with the care you need, when you need it. . WE WILL CALL YOU WITH AN APPOINTMENT  Any Other Special Instructions Will Be Listed Below (If Applicable).

## 2019-06-15 NOTE — Progress Notes (Signed)
Virtual Visit via Telephone Note   This visit type was conducted due to national recommendations for restrictions regarding the COVID-19 Pandemic (e.g. social distancing) in an effort to limit this patient's exposure and mitigate transmission in our community.  Due to his co-morbid illnesses, this patient is at least at moderate risk for complications without adequate follow up.  This format is felt to be most appropriate for this patient at this time.  The patient did not have access to video technology/had technical difficulties with video requiring transitioning to audio format only (telephone).  All issues noted in this document were discussed and addressed.  No physical exam could be performed with this format.  Please refer to the patient's chart for his  consent to telehealth for [facility].   Date:  06/15/2019   ID:  [name], DOB 09/12/1946, MRN [id number]  Patient Location: Home Daughter [name] was present Provider Location: Home  PCP:  [name] Sou, MD  Cardiologist:  [name] Him, MD  Electrophysiologist:  None   Evaluation Performed:  Follow-Up Visit  Chief Complaint:  SOB improved currently   History of Present Illness:    [name] is a 73 y.o. male with a hx of obesity hypoventilation syndrome on home O2, HTN, dyslipidemia, morbid obesity and chronic LE edema which is controlled with TED hose stockings and diuretic. He has chronic diastolic CHF controlled on diuretics. He alsohas ASCAD with severely diseased and chronically occluded LAD and diagonal with right to left collaterals, mild disease in the left circ and moderate disease in the mid RCA on medical management with Imdur and Plavix.He also has a history of adenoCA of the GE junction treated with Chemo.He is DNR/DNI.   At [facility] in 11/2018 he complained of SOB and 2D echo showed new LV dysfunction with EF 40-45% with severe hypokinesis of the apical, anterior septal, anterior and apical left  ventricular segments.  He underwent R&L heart cath showing:  Follow up R & L cath as below 12/17/18:   Prox LAD lesion is 80% stenosed with 99% stenosed side branch in Ost 1st Diag.  Prox LAD to Mid LAD lesion is 100% stenosed (CTO with bridging collaterals -- Mid LAD to Dist LAD lesion is 90% stenosed.)  Prox Cx lesion is 40% stenosed.  Right Heart Cath (RAP-RVP-PA P-PCWP) and LV EDP are all relatively normal  SUMMARY  Essentially stable severe LAD diagonal disease with minimal change from last procedure -not PCI amenable  Relatively normal Right Heart Cath Pressures with Wedge Pressure of 11, LV EDP of 14. --PA pressures are not consistent with pulmonary hypertension  RECOMMENDATIONS  Appears euvolemic on exam, is euvolemic on right heart cath. No active CHF symptoms.  Likely cause for worsening anterior wall motion is simply progression of disease. His LAD has been occluded since 2014. This is not an unexpected wall motion normality. I doubt that this is viable PCI target.  He underwent thoracentesis on the right of 770cc exudative pleural fluid - followed by Dr. [place] [name].  He is here today for followup and is doing well.  He denies any chest pain or pressure,  PND, orthopnea, LE edema, dizziness, palpitations or syncope. He says that his SOB is stable which is from his chronic lung disease.   He is compliant with his meds and is tolerating meds with no SE.    Back to add spironolactone if stable on meds.  Unfortunately he was not able to tolerate Losartan due to hypotension.  He does continue on lasix and BB.  His wt is down 9 lbs - he just had repeat thoracentesis of 650 ml.  Today his SOB is improved.  Labs from Friday K+ is 5.3 - he does use lite salt, so he will stop using and does not eat much fruit due to glucose.  He is hard of hearing.  Daughter [name] is with [name] for visit.  He has seen oncology and they are to repeat CT.      The patient does not have symptoms  concerning for COVID-19 infection (fever, chills, cough, or new shortness of breath).    Past Medical History:  Diagnosis Date   Acute upper GI bleed 02/2018   Transfused 4 U pRBCs-->EGD showed an esoph ulcer and some gastric ulcers (?radiation-induced), path showed no malignancy or Barrett's.  Rpt EGD 04/28/18: Grade I and small (< 5 mm) distal esophageal varices.  Esoph ulcer healed.  Mild portal hypert gastrop.  No sign of malignancy.   Asthma    as a child   CAD in native artery    LAD occluded since 2014->a. cardiac cath 09/2013 showed severely diseased mLAD and diagonal, mild LCx disease, moderate RCA disease, LVEDP 20, EF 50%. 11/2018 cath->progression of LAD dz, not amenable to PCI   Cataract    multiple types, bilateral   Cholelithiasis without cholecystitis    Chronic combined systolic and diastolic CHF (congestive heart failure) ([place]) 02/25/2009   EF 40-45% 2020; cath 11/2018 showed stable chronic occlusion of LAD w/collateral circ.  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   Dermatitis 05/2014   DIABETES MELLITUS, TYPE II 06/24/2007   No diab retpthy as of 08/05/15 eye exam.   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  CT C/A/P showed metastatic adenopathy in mediastinum and upper abdomen 05/09/16.  Tx plan is  palliative radiation (completed 06/22/16), then palliative systemic chemotherapy (carbo+taxol) was started but as of 08/03/16 onc f/u this was held due to severe knee and ankle arthralgias.  Restarting as of 10/2015 (08/2016 CT showed some disease regression   Esophageal cancer ([place])    CT 12/2016 showed no residual esoph mass--plan to continue chemo.  Ongoing palliative chemo with carboplatin and taxol q 2 wks as of 03/2017 onc f/u (CT 12/2016 and 04/2017 showed no progression of dz).  Taking a 2 mo break from chemo as of 05/2017.  Pleural effusion-- improved with lasix but onc wanted [name] to get dx/therap thoracentesis-pt declined.  CT C/A/P no progression/recurr/mets 11/2017.   Esophageal cancer ([place])    11/2018 surveillance CTs showed no evidence of dz recurrence.   Essential hypertension 05/01/2007   Qualifier: Diagnosis of  By: Tiney Rouge CMA, Ellison Hughs      Herpes zoster 12/2018   Valtrex   History of kidney stones  Hyperkalemia 12/2015   Decreased ACE-I by 50% in response, then potassium normalized.   HYPERLIPIDEMIA 06/24/2007   03/2018 lipids at goal   Morbid obesity ([place])    Obesity hypoventilation syndrome (HCC)    oxygen 24/7 (2 liters [place] as of 02/2016)   On home oxygen therapy    Oxygen @ 2l/m nasally 24/7 hours   OSA (obstructive sleep apnea)    not tested; pt scored 4 per stop bang tool results sent to PCP    OSTEOARTHRITIS 05/01/2007   Pancytopenia due to chemotherapy ([name]) 2018   Pleural effusion on right 08/2017   ? malignant vs CHF: pt refuses diagnostic thoracentesis b/c he states he feels fine, wants to try diuretic first.   Pleural effusion on right 09/2017; 2019/2020   Thoracentesis.  Most recent thoracentesis 12/2018->no malignancy.   Presbycusis of both ears 05/2015   [facility]   Pruritic condition 05/2014   Allergist summer 2015, no new testing.   RBBB    Visual field defect 05/10/2016   Per Dr. Melina Fiddler, O.D.: Rt, loss of inf/temp quad and some loss sup/temp.   ?CVA  ? Pituitary tumor? 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   Past Surgical History:  Procedure Laterality Date   APPENDECTOMY     BALLOON DILATION N/A 05/04/2016   Procedure: BALLOON DILATION;  Surgeon: Jerene Bears, MD;  Location: [facility];  Service: Gastroenterology;  Laterality: N/A;   BIOPSY  03/19/2018   Procedure: BIOPSY;  Surgeon: Jackquline Denmark, MD;  Location: [facility];  Service: Endoscopy;;   CARDIAC CATHETERIZATION     CARPAL TUNNEL RELEASE Left    CATARACT EXTRACTION W/PHACO Right 04/29/2013   Procedure: CATARACT EXTRACTION PHACO AND INTRAOCULAR LENS PLACEMENT ([place]);  Surgeon: Adonis Brook, MD;  Location: [facility];  Service: Ophthalmology;  Laterality: Right;   CATARACT EXTRACTION, BILATERAL     COLONOSCOPY W/ POLYPECTOMY  08/2011   Many polyps--all hyperplastic, severe diverticulosis, int hem.  [employer] hemoccult testing via [facility] 06/22/15 NEG   ESOPHAGOGASTRODUODENOSCOPY N/A 05/04/2016   Procedure: ESOPHAGOGASTRODUODENOSCOPY (EGD);  Surgeon: Jerene Bears, MD;  Location: [facility];  Service: Gastroenterology;  Laterality: N/A;   ESOPHAGOGASTRODUODENOSCOPY (EGD) WITH PROPOFOL N/A 03/19/2018   Gastric ulcers, esoph ulcer (radiation-induced?), bx-->path neg for malignancy or Barrett's.  GI plans repeat EGD 6-8 wks after 1st.   Procedure: ESOPHAGOGASTRODUODENOSCOPY (EGD) WITH PROPOFOL;  Surgeon: Jackquline Denmark, MD;  [facility]: [facility];  Service: Endoscopy;  Laterality: N/A;   ESOPHAGOGASTRODUODENOSCOPY (EGD) WITH PROPOFOL N/A 04/28/2018   Esoph ulcer healed.  Grade I and small (< 5 mm) distal esophageal varices.  Mild portal hypertensive gastropathy.  No evidence of malignance.  Procedure: ESOPHAGOGASTRODUODENOSCOPY (EGD) WITH PROPOFOL;  Surgeon: Jerene Bears, MD;  [facility]: [facility];  Service: Gastroenterology;  Laterality: N/A;   IR GENERIC HISTORICAL  10/03/2016   IR US GUIDE VASC ACCESS RIGHT 10/03/2016 Greggory Keen, MD  [facility]   IR GENERIC HISTORICAL  10/03/2016   IR FLUORO GUIDE PORT INSERTION RIGHT 10/03/2016 Greggory Keen, MD [facility]   KNEE ARTHROSCOPY WITH MEDIAL MENISECTOMY Right 12/16/2014   Procedure: RIGHT KNEE ARTHROSCOPY WITH MEDIAL MENISECTOMY microfracture medial femoral condyle abrasion condroplasty medial femoral condyle lateral menisectomy;  Surgeon: Tobi Bastos, MD;  Location: [facility];  Service: Orthopedics;  Laterality: Right;   LEFT HEART CATHETERIZATION WITH CORONARY ANGIOGRAM N/A 10/26/2013   Procedure: LEFT HEART CATHETERIZATION WITH CORONARY ANGIOGRAM;  Surgeon: Jettie Booze, MD;  Location:  [facility];  Service: Cardiovascular;  Laterality: N/A;   LUMBAR DISC SURGERY  2001   RIGHT/LEFT HEART CATH AND CORONARY ANGIOGRAPHY N/A 12/17/2018   Progression of LAD dz, not amenable to PCI. R heart cath with normal pressures.  Procedure: RIGHT/LEFT HEART CATH AND CORONARY ANGIOGRAPHY;  Surgeon: Leonie Man, MD;  Location: [facility];  Service: Cardiovascular;  Laterality: N/A;   SHOULDER SURGERY Right    right fx   THORACENTESIS  12/2018   Right->no malignancy   TONSILLECTOMY     TRANSTHORACIC ECHOCARDIOGRAM  03/2016; 11/25/2018   2017: EF 55-60%, grade I DD, LAE.  Jan 2020->new anteroapical akinesis, EF 40-45%-->cath 11/2018 showed no acute changes (progression of LAD dz->this explains the [facility] seen on recent echo.   WRIST SURGERY Right    right fx     Current Meds  Medication Sig   acetaminophen (TYLENOL) 500 MG tablet Take 1,000 mg by mouth every 6 (six) hours as needed for moderate pain.   atorvastatin (LIPITOR) 80 MG tablet Take 1 tablet by mouth once daily   baclofen (LIORESAL) 10 MG tablet TAKE 1/2 TO 1 (ONE-HALF TO ONE) TABLET BY MOUTH EVERY 8 HOURS AS NEEDED FOR MUSCLE SPASM   beta carotene w/minerals (OCUVITE) tablet Take 1 tablet by mouth daily.   cetirizine (ZYRTEC) 10 MG tablet Take 10 mg by mouth every evening.    citalopram  (CELEXA) 20 MG tablet Take 1 tablet by mouth once daily   clonazePAM (KLONOPIN) 1 MG tablet Take 1 tablet by mouth twice daily as needed for anxiety   clotrimazole-betamethasone (LOTRISONE) cream APPLY  CREAM TOPICALLY TO AFFECTED AREA TWICE DAILY   ezetimibe (ZETIA) 10 MG tablet Take 1 tablet by mouth once daily   furosemide (LASIX) 40 MG tablet Take 1 tablet every other day alternating with a 1/2 tablet every other day.   gabapentin (NEURONTIN) 300 MG capsule TAKE 1 CAPSULE BY MOUTH THREE TIMES DAILY   HUMULIN N KWIKPEN 100 UNIT/ML Kiwkpen INJECT 16 UNITS SUBCUTANEOUSLY IN THE MORNING AND 16 IN THE EVENING   hydrOXYzine (ATARAX/VISTARIL) 25 MG tablet TAKE ONE TABLET BY MOUTH AT SUPPER AND 1 TABLET AT BEDTIME FOR INSOMNIA   Insulin Lispro (HUMALOG KWIKPEN) 200 UNIT/ML SOPN Inject 20 Units into the skin 2 (two) times daily.   magnesium chloride (SLOW-MAG) 64 MG TBEC SR tablet Take 1 tablet (64 mg total) by mouth daily.   metFORMIN (GLUCOPHAGE) 1000 MG tablet TAKE 1 TABLET BY MOUTH TWICE DAILY WITH MEALS   Omega-3 Fatty Acids (FISH OIL) 1200 MG CAPS Take 2,400 mg by mouth 2 (two) times daily.   ONE TOUCH ULTRA TEST test strip USE 1 STRIP TO CHECK GLUCOSE THREE TIMES DAILY AS DIRECTED   ONETOUCH DELICA LANCETS 56D MISC USE   TO CHECK GLUCOSE THREE TIMES DAILY   OXYGEN Inhale 2 L into the lungs continuous.    pantoprazole (PROTONIX) 40 MG tablet TAKE 1 TABLET BY MOUTH TWICE DAILY   [DISCONTINUED] metoprolol tartrate (LOPRESSOR) 25 MG tablet Take 0.5 tablets (12.5 mg total) by mouth 2 (two) times daily.     Allergies:   Patient has no known allergies.   Social History   Tobacco Use   Smoking status: Former Smoker    Packs/day: 1.50    Years: 30.00    Pack years: 45.00    Types: Cigarettes, Pipe, Cigars    Quit date: 10/30/1983    Years since quitting: 35.6   Smokeless tobacco: Current User  Types: Sarina Ser    Last attempt to quit: 05/15/2016  Substance Use Topics    Alcohol use: No   Drug use: No     Family Hx: The patient's family history includes Alcohol abuse in his father; Aneurysm in his father; Diabetes in his maternal aunt; Heart attack in his mother. There is no history of Colon cancer.  ROS:   Please see the history of present illness.    General:no colds or fevers, no weight changes Skin:no rashes or ulcers HEENT:no blurred vision, no congestion CV:see HPI PUL:see HPI GI:no diarrhea constipation or melena, no indigestion GU:no hematuria, no dysuria MS:no joint pain, no claudication Neuro:no syncope, no lightheadedness Endo:+ diabetes, no thyroid disease  All other systems reviewed and are negative.   Prior CV studies:   The following studies were reviewed today:  See above  Labs/Other Tests and Data Reviewed:    EKG:  12/18/18 SR with LAD and RBBB I reviewed, old Ant mI  Recent Labs: 11/20/2018: NT-Pro BNP 90 06/05/2019: ALT 17; Hemoglobin 9.5; Platelets 208 06/12/2019: BUN 11; Creatinine, Ser 0.72; Potassium 5.3; Sodium 138   Recent Lipid Panel Lab Results  Component Value Date/Time   CHOL 96 (L) 06/04/2019 09:15 AM   TRIG 110 06/04/2019 09:15 AM   TRIG 268 (HH) 09/06/2006 03:55 PM   HDL 44 06/04/2019 09:15 AM   CHOLHDL 2.2 06/04/2019 09:15 AM   CHOLHDL 3.6 03/12/2016 10:01 AM   LDLCALC 30 06/04/2019 09:15 AM   LDLDIRECT 74.0 04/15/2015 08:38 AM    Wt Readings from Last 3 Encounters:  06/15/19 260 lb (117.9 kg)  06/05/19 263 lb 11.2 oz (119.6 kg)  05/25/19 271 lb (122.9 kg)     Objective:    Vital Signs:  BP 111/64 (BP Location: Left Arm, Patient Position: Sitting, Cuff Size: Normal)    Pulse 74    Temp 98.5 F (36.9 C)    Ht '5\' 8"'  (1.727 m)    Wt 260 lb (117.9 kg)    BMI 39.53 kg/m    VITAL SIGNS:  reviewed  General NAD, hard of hearing, daughter would need to repeat my questions.  Lungs no SOB with speech Neuro A&O X 3   ASSESSMENT & PLAN:    1. Chronic combined systolic/diastolic CHF/DCM EF 40-98%,  related to progressive dz of LAD not amenable to PCI, unable to tolerate losartan due to hypotension, his lasix is at 40 mg alternating with 20 mg every other day.   Repeat thoracentesis with 650 drained 06/11/19 and 770 in 12/2018.  Most recent K+ is 5.3, he will stop lite salt, will check with Dr. Radford Pax if need to try and add spironolactone.   Will recheck BMP on Wed of this week.   Also note pt is down 9 lbs since last visit.   2. CAD with chronically occluded LAD and diag with Rt to Lt collaterals, mild disease in LCX and mod disease in mRCA stable on cath 11/2018 no angina  3. HTN no on lower end, unable to tolerate losartan may be lower with wt loss. 4. HLD stable. On statin and zetia  5. Hx of cancer cardioesophageal junction, treated off chemo since 05/2017,  He is for CT of chest on the 20th.     COVID-19 Education: The signs and symptoms of COVID-19 were discussed with the patient and how to seek care for testing (follow up with PCP or arrange E-visit).  The importance of social distancing was discussed today.  Time:  Today, I have spent 10 minutes with the patient with telehealth technology discussing the above problems.     Medication Adjustments/Labs and Tests Ordered: Current medicines are reviewed at length with the patient today.  Concerns regarding medicines are outlined above.   Tests Ordered: Orders Placed This Encounter  Procedures   Basic metabolic panel    Medication Changes: No orders of the defined types were placed in this encounter.   Follow Up:  Virtual Visit in 2 week(s)  depending on Dr. Theodosia Blender plan with spironolactone.  Signed, Cecilie Kicks, NP  06/15/2019 2:59 PM    [facility]

## 2019-06-17 ENCOUNTER — Other Ambulatory Visit: Payer: Self-pay | Admitting: Hematology

## 2019-06-17 ENCOUNTER — Other Ambulatory Visit: Payer: Self-pay

## 2019-06-17 ENCOUNTER — Telehealth: Payer: Self-pay | Admitting: Cardiology

## 2019-06-17 ENCOUNTER — Other Ambulatory Visit: Payer: Medicare Other | Admitting: *Deleted

## 2019-06-17 DIAGNOSIS — Z79899 Other long term (current) drug therapy: Secondary | ICD-10-CM | POA: Diagnosis not present

## 2019-06-17 DIAGNOSIS — C160 Malignant neoplasm of cardia: Secondary | ICD-10-CM

## 2019-06-17 NOTE — Telephone Encounter (Signed)
Daughter of the patient called today. She was shocked to find out her dad

## 2019-06-17 NOTE — Telephone Encounter (Signed)
Returned daughter, Britt Boozer, [place] on file, call.  No voicemail has been set up and couldn't leave a message.

## 2019-06-17 NOTE — Telephone Encounter (Signed)
PER DAUGHTER SPOKE WITH SOMEONE RE MESSAGE AND PT WILL HAVE LABS DONE TODAY AS PLANNED .Adonis Housekeeper

## 2019-06-17 NOTE — Telephone Encounter (Signed)
* °  Daughter states that the patient has a hard time getting out of the house, and he has has the same labs drawn the past two fridays. She would like to know why the labs are being drawn today. Please advise

## 2019-06-18 ENCOUNTER — Ambulatory Visit
Admission: RE | Admit: 2019-06-18 | Discharge: 2019-06-18 | Disposition: A | Discharge status: Home / Self Care | Payer: Medicare Other | Source: Ambulatory Visit | Attending: Hematology | Admitting: Hematology

## 2019-06-18 DIAGNOSIS — Z8501 Personal history of malignant neoplasm of esophagus: Secondary | ICD-10-CM | POA: Diagnosis not present

## 2019-06-18 DIAGNOSIS — C160 Malignant neoplasm of cardia: Secondary | ICD-10-CM

## 2019-06-18 DIAGNOSIS — K7460 Unspecified cirrhosis of liver: Secondary | ICD-10-CM | POA: Diagnosis not present

## 2019-06-18 DIAGNOSIS — R590 Localized enlarged lymph nodes: Secondary | ICD-10-CM | POA: Diagnosis not present

## 2019-06-18 DIAGNOSIS — J90 Pleural effusion, not elsewhere classified: Secondary | ICD-10-CM | POA: Diagnosis not present

## 2019-06-18 LAB — BASIC METABOLIC PANEL
BUN/Creatinine Ratio: 16 (ref 10–24)
BUN: 15 mg/dL (ref 8–27)
CO2: 39 mmol/L — ABNORMAL HIGH (ref 20–29)
Calcium: 8.5 mg/dL — ABNORMAL LOW (ref 8.6–10.2)
Chloride: 88 mmol/L — ABNORMAL LOW (ref 96–106)
Creatinine, Ser: 0.92 mg/dL (ref 0.76–1.27)
GFR calc Af Amer: 95 mL/min/{1.73_m2} (ref >59)
GFR calc non Af Amer: 82 mL/min/{1.73_m2} (ref >59)
Glucose: 141 mg/dL — ABNORMAL HIGH (ref 65–99)
Potassium: 4.3 mmol/L (ref 3.5–5.2)
Sodium: 140 mmol/L (ref 134–144)

## 2019-06-18 MED ORDER — IOPAMIDOL (ISOVUE-300) INJECTION 61%
125.0000 mL | Freq: Once | INTRAVENOUS | Status: AC | PRN
  Administered 2019-06-18: 125 mL via INTRAVENOUS

## 2019-06-18 NOTE — Progress Notes (Signed)
[place]   Telephone:([phone]) [phone] Fax:(336) [phone]   Clinic Follow up Note   Patient Care Team: Tammi Sou, MD as PCP - General (Family Medicine) Sueanne Margarita, MD as PCP - Cardiology (Cardiology) Harold Hedge, Darrick Grinder, MD as Consulting Physician (Allergy and Immunology) Shirley Muscat Loreen Freud, MD as Consulting Physician (Optometry) Sueanne Margarita, MD as Consulting Physician (Cardiology) Pyrtle, Lajuan Lines, MD as Consulting Physician (Gastroenterology) Latanya Maudlin, MD as Consulting Physician (Orthopedic Surgery) Jodi Marble, MD as Consulting Physician (Otolaryngology) Truitt Merle, MD as Consulting Physician (Hematology) Kyung Rudd, MD as Consulting Physician (Radiation Oncology) Juanito Doom, MD as Consulting Physician (Pulmonary Disease) Leona Singleton, RN as [place] Management   I connected with [name] on 06/19/2019 at 10:15 AM EDT by telephone visit and verified that I am speaking with the correct person using two identifiers.  I discussed the limitations, risks, security and privacy concerns of performing an evaluation and management service by telephone and the availability of in person appointments. I also discussed with the patient that there may be a patient responsible charge related to this service. The patient expressed understanding and agreed to proceed.   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 The mass was not obstructing and partially circumferential, extends approximately 5 cm. Nonbleeding erosive gastropathy.   05/04/2016 Initial Biopsy   Esophageal gastric junction biopsy showed a poorly differentiated carcinoma underlying the squamous mucosa. There is lymphovascular invasion. No Intestinal metaplasia. IHC weakly positive CK5/6, p63 (-), favor squamous.     05/09/2016 Imaging   CT CAP w contrast showed mild wall thickening involving the distal esophagus and proximal stomach compatible with known cancer, enlarged mediastinal and upper abdominal lymph nodes are highly suspicious for metastatic adenopathy, propable liver cirrhosis.   06/04/2016 - 06/22/2016 Radiation Therapy   palliative radiation to esophageal cancer    07/06/2016 - 06/20/2017 Chemotherapy   chemotherapy with weekly carboplatin and taxol, started on 07/06/2016, held 08/21/16-09/28/2016 due to hospitalization, changed to every 2 weeks from 11/30/2016. Chemo stopped due to no evidence of disease on restaging scan.   07/30/2016 Pathology Results   PD-L1 negative expression   08/28/2016 - 08/31/2016 Hospital Admission   The patient was admitted to 4 severe hypoglycemia, dehydration, and acute renal failure. Infection workup was negative. He recovered well with supportive care. His insulin and hypertension medication was held on discharge, except insulin sliding scale.   09/24/2016 Imaging   CT CAP W CONTRAST 09/26/2016 IMPRESSION: Decreased masslike soft tissue prominence at gastroesophageal junction, consistent with decreased size of primary gastroesophageal junction carcinoma. Resolution of paraesophageal and gastrohepatic ligament lymphadenopathy since prior exam. Other sub-cm mediastinal lymph nodes show little or no significant change. Stable indeterminate  sub-cm low-attenuation lesion in the liver dome. Probable  hepatic cirrhosis. Recommend continued attention on follow-up CT. No new or progressive metastatic disease identified. Cholelithiasis.  No radiographic evidence of cholecystitis. Colonic diverticulosis. No radiographic evidence of diverticulitis. Aortic atherosclerosis and three-vessel coronary artery calcification.   01/07/2017 Imaging   CT CAP w contrast 1. No residual esophageal mass or adenopathy. 2. New nodular airspace opacification in the left lower lobe. Continued attention on followup exams is warranted as metastatic disease cannot be definitively excluded. 3. Cirrhosis. 4. Trace left pleural effusion. 5. Aortic atherosclerosis (ICD10-170.0). Coronary artery calcification. 6. Cholelithiasis. 7. Mild basilar predominant subpleural reticular densities. Difficult to exclude interstitial lung disease.   04/29/2017 Imaging   CT CAP w contrast  IMPRESSION: 1. No evidence of metastatic disease. 2. Nodular airspace disease previously seen in the left lower lobe has resolved in the interval. 3. Tiny right pleural effusion, new. Small left pleural effusion, slightly increased. 4. Cirrhosis with splenomegaly. 5.  Aortic atherosclerosis (ICD10-170.0).   09/05/2017 Imaging   CT CAP W Contrast 09/05/17  IMPRESSION: 1. Currently no esophageal mass is visible, nor is there significant mediastinal adenopathy. There is hepatic cirrhosis but no compelling findings of hepatic metastatic disease. 2. Large right pleural effusion is increased from the prior exam. Small stable left pleural effusion. 3. Small but increased amount of ascites and mesenteric edema compared to prior. 4. Other imaging findings of potential clinical significance: Aortic Atherosclerosis (ICD10-I70.0). Coronary atherosclerosis. The markedly severe right glenohumeral arthropathy. Thoracic and lumbar spondylosis with lower lumbar multilevel impingement, and  notable intervertebral spurring at T12-L1. Cholelithiasis. Degenerative arthropathy of both hips.    10/21/2017 Imaging   Chest X-Ray IMPRESSION: Small bilateral effusions, decreased on the right since prior CT.   12/05/2017 Imaging   CT CAP W Contrast 12/05/17 IMPRESSION: 1. Stable exam. No esophageal mass or mediastinal adenopathy identified. No evidence for metastatic disease to the chest, abdomen and pelvis. 2. Cirrhosis with ascites. 3. Persistent bilateral pleural effusions, right greater than left. 4. Aortic Atherosclerosis (ICD10-I70.0). Three vessel coronary artery atherosclerotic calcifications noted. 5. Thoracic and lumbar spondylosis and advanced right glenohumeral joint arthropathy. 6. Gallstones.   03/18/2018 - 03/19/2018 Hospital Admission   Admit date: 03/18/18  Admission diagnosis: GI Bleeding from grastic ulcer bleed Additional comments: Had Upper Endoscopy, and blood trasnfusion on 03/19/18. He is no longer on aspirin.    03/19/2018 Procedure   Upper Endoscopy by Dr. Hilarie Fredrickson on 03/19/18  IMPRESSION - Distal esophageal ulcer. Biopsied (could represent radiation-induced ulcers, rule out recurrence) - Non-bleeding erosive gastropathy. Biopsied. And benign - Normal examined duodenum.   Diagnosis 1. Stomach, biopsy - MILD CHRONIC GASTRITIS WITHOUT ACTIVITY - NO H. PYLORI OR INTESTINAL METAPLASIA IDENTIFIED - SEE COMMENT 2. Esophagus, biopsy, distal - SQUAMOCOLUMNAR JUNCTION WITH CHRONIC INFLAMMATION AND FOCAL ULCERATION - NO INTESTINAL METAPLASIA OR MALIGNANCY IDENTIFIED Microscopic Comment 1. A Warthin-Starry stain is performed to determine the possibility of the presence of Helicobacter pylori. The Warthin-Starry stain is negative for organisms morphologically consistent with Helicobacter pylori.   04/28/2018 Procedure   04/28/2018 Upper Endoscopy - Grade I and small (< 5 mm) distal esophageal varices. - Previously seen esophageal ulceration has healed. No  evidence for GE junction tumor. - Mild portal hypertensive gastropathy. - Normal examined duodenum. - No specimens collected.   06/16/2018 Imaging   06/16/2018 CT CAP IMPRESSION: 1. New mild upper right paratracheal adenopathy, nonspecific, cannot exclude early recurrent metastatic disease. 2. No additional potential findings of metastatic disease in the chest, abdomen or pelvis. No discrete CT findings of local tumor  recurrence at the esophagogastric junction. 3. Moderate dependent right pleural effusion, mildly increased. Small dependent left pleural effusion, stable. 4. Hepatic cirrhosis. Small volume ascites. 5.  Aortic Atherosclerosis (ICD10-I70.0).   12/10/2018 Imaging   CT Angio Chest PE W or WO contrast 12/10/18   IMPRESSION: Large right pleural effusion and small left pleural effusion. Compressive atelectasis versus pneumonia in the right middle lobe and right lower lobe. Compressive atelectasis in the left lower lobe.   Densely calcified coronary arteries.  Aortic atherosclerosis.   No evidence of pulmonary embolus.   Changes of cirrhosis with splenomegaly.   Nodular disease noted in the peritoneum anterior to the spleen in the left upper quadrant, appears progressed since prior study. This could reflect peritoneal spread of tumor.   Mildly prominent right paratracheal lymph node, slightly larger.   Aortic Atherosclerosis (ICD10-I70.0).   01/12/2019 Procedure   Thoracentesis. 781m removed  Diagnosis 01/12/19 PLEURAL FLUID, RIGHT (SPECIMEN 1 OF 1 COLLECTED 01/12/19): NUMEROUS LYMPHOCYTES. NO METASTATIC CARCINOMA.   06/18/2019 Imaging   CT CAP W Contast 06/18/19  IMPRESSION: 1. Slight interval increase in size right paratracheal lymph node when compared to prior exam. Cannot exclude early recurrent metastatic disease. 2. Similar-appearing posterior right paratracheal lymph node when compared to prior exam. 3. Interval development of large loculated left pleural  effusion and interval increase in size of loculated right pleural effusion. Possibility of malignant effusion not entirely excluded. 4. No findings suggestive of metastatic disease in the abdomen or pelvis. 5. Hepatic cirrhosis. 6. Mild wall thickening of the cecum and ascending colon which may be secondary to third spacing of fluid. Colitis not excluded. Recommend clinical correlation.      CURRENT THERAPY:  Observation  INTERVAL HISTORY:  [name] JUMPERis here for a follow up of his esophogeal cancer.   His breathing improved with thoracentesis but still has SOB and he fatigues much quicker. He does not have appetite and family makes sure he eats. He has lost weight. He has been seeing his cardiologist for the last 2 weeks several times. They tried to start another BP medication but he had a reaction so it was stopped. He is on alternating 466m20mg lasix. Otherwise no medication change. He has not seen pulmonologist since 01/2019.    REVIEW OF SYSTEMS:   Constitutional: Denies fevers, chills (+) Low appetite, weight loss (+) Fatigue  Eyes: Denies blurriness of vision Ears, nose, mouth, throat, and face: Denies mucositis or sore throat Respiratory: Denies cough (+) SOB Cardiovascular: Denies palpitation, chest discomfort or lower extremity swelling Gastrointestinal:  Denies nausea, heartburn or change in bowel habits Skin: Denies abnormal skin rashes Lymphatics: Denies new lymphadenopathy or easy bruising Neurological:Denies numbness, tingling or new weaknesses Behavioral/Psych: Mood is stable, no new changes  All other systems were reviewed with the patient and are negative.  MEDICAL HISTORY:  Past Medical History:  Diagnosis Date   Acute upper GI bleed 02/2018   Transfused 4 U pRBCs-->EGD showed an esoph ulcer and some gastric ulcers (?radiation-induced), path showed no malignancy or Barrett's.  Rpt EGD 04/28/18: Grade I and small (< 5 mm) distal esophageal varices.  Esoph  ulcer healed.  Mild portal hypert gastrop.  No sign of malignancy.   Asthma    as a child   CAD in native artery    LAD occluded since 2014->a. cardiac cath 09/2013 showed severely diseased mLAD and diagonal, mild LCx disease, moderate RCA disease, LVEDP 20, EF 50%. 11/2018 cath->progression of LAD dz, not amenable to PCI  Cataract    multiple types, bilateral   Cholelithiasis without cholecystitis    Chronic combined systolic and diastolic CHF (congestive heart failure) ([place]) 02/25/2009   EF 40-45% 2020; cath 11/2018 showed stable chronic occlusion of LAD w/collateral circ.  Since 2020 echo showed decrease in EF, low dose metop added w/plan to add ACE/ARB   Chronic renal insufficiency, stage III (moderate) (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   Dermatitis 05/2014   DIABETES MELLITUS, TYPE II 06/24/2007   No diab retpthy as of 08/05/15 eye exam.   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 Pyrtle--EGD).  CT C/A/P showed metastatic adenopathy in mediastinum and upper abdomen 05/09/16.  Tx plan is palliative radiation (completed 06/22/16), then palliative systemic chemotherapy (carbo+taxol) was started but as of 08/03/16 onc f/u this was held due to severe knee and ankle arthralgias.  Restarting as of 10/2015 (08/2016 CT showed some disease regression   Esophageal cancer ([place])    CT 12/2016 showed no residual esoph mass--plan to continue chemo.  Ongoing palliative chemo with  carboplatin and taxol q 2 wks as of 03/2017 onc f/u (CT 12/2016 and 04/2017 showed no progression of dz).  Taking a 2 mo break from chemo as of 05/2017.  Pleural effusion-- improved with lasix but onc wanted him to get dx/therap thoracentesis-pt declined.  CT C/A/P no progression/recurr/mets 11/2017.   Esophageal cancer ([place])    11/2018 surveillance CTs showed no evidence of dz recurrence.   Essential hypertension 05/01/2007   Qualifier: Diagnosis of  By: Tiney Rouge CMA, Ellison Hughs      Herpes zoster 12/2018   Valtrex   History of kidney stones    Hyperkalemia 12/2015   Decreased ACE-I by 50% in response, then potassium normalized.   HYPERLIPIDEMIA 06/24/2007   03/2018 lipids at goal   Morbid obesity ([place])    Obesity hypoventilation syndrome (HCC)    oxygen 24/7 (2 liters [place] as of 02/2016)   On home oxygen therapy    Oxygen @ 2l/m nasally 24/7 hours   OSA (obstructive sleep apnea)    not tested; pt scored 4 per stop bang tool results sent to PCP    OSTEOARTHRITIS 05/01/2007   Pancytopenia due to chemotherapy ([place]) 2018   Pleural effusion on right 08/2017   ? malignant vs CHF: pt refuses diagnostic thoracentesis b/c he states he feels fine, wants to try diuretic first.   Pleural effusion on right 09/2017; 2019/2020   Thoracentesis.  Most recent thoracentesis 12/2018->no malignancy.   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   RBBB    Visual field defect 05/10/2016   Per Dr. Melina Fiddler, O.D.: Rt, loss of inf/temp quad and some loss sup/temp.  ?CVA  ? Pituitary tumor? 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    SURGICAL HISTORY: Past Surgical History:  Procedure Laterality Date  APPENDECTOMY     BALLOON DILATION N/A 05/04/2016   Procedure: BALLOON DILATION;  Surgeon: Jerene Bears, MD;  Location: [facility];  Service: Gastroenterology;  Laterality: N/A;   BIOPSY  03/19/2018     Procedure: BIOPSY;  Surgeon: Jackquline Denmark, MD;  Location: [facility];  Service: Endoscopy;;   CARDIAC CATHETERIZATION     CARPAL TUNNEL RELEASE Left    CATARACT EXTRACTION W/PHACO Right 04/29/2013   Procedure: CATARACT EXTRACTION PHACO AND INTRAOCULAR LENS PLACEMENT ([place]);  Surgeon: Adonis Brook, MD;  Location: [facility];  Service: Ophthalmology;  Laterality: Right;   CATARACT EXTRACTION, BILATERAL     COLONOSCOPY W/ POLYPECTOMY  08/2011   Many polyps--all hyperplastic, severe diverticulosis, int hem.  [employer] hemoccult testing via [facility] 06/22/15 NEG   ESOPHAGOGASTRODUODENOSCOPY N/A 05/04/2016   Procedure: ESOPHAGOGASTRODUODENOSCOPY (EGD);  Surgeon: Jerene Bears, MD;  Location: [facility];  Service: Gastroenterology;  Laterality: N/A;   ESOPHAGOGASTRODUODENOSCOPY (EGD) WITH PROPOFOL N/A 03/19/2018   Gastric ulcers, esoph ulcer (radiation-induced?), bx-->path neg for malignancy or Barrett's.  GI plans repeat EGD 6-8 wks after 1st.   Procedure: ESOPHAGOGASTRODUODENOSCOPY (EGD) WITH PROPOFOL;  Surgeon: Jackquline Denmark, MD;  [facility];  Service: Endoscopy;  Laterality: N/A;   ESOPHAGOGASTRODUODENOSCOPY (EGD) WITH PROPOFOL N/A 04/28/2018   Esoph ulcer healed.  Grade I and small (< 5 mm) distal esophageal varices.  Mild portal hypertensive gastropathy.  No evidence of malignance.  Procedure: ESOPHAGOGASTRODUODENOSCOPY (EGD) WITH PROPOFOL;  Surgeon: Jerene Bears, MD;  [facility];  Service: Gastroenterology;  Laterality: N/A;   IR GENERIC HISTORICAL  10/03/2016   IR US GUIDE VASC ACCESS RIGHT 10/03/2016 Greggory Keen, MD [facility]   IR GENERIC HISTORICAL  10/03/2016   IR FLUORO GUIDE PORT INSERTION RIGHT 10/03/2016 Greggory Keen, MD [facility]   KNEE ARTHROSCOPY WITH MEDIAL MENISECTOMY Right 12/16/2014   Procedure: RIGHT KNEE ARTHROSCOPY WITH MEDIAL MENISECTOMY microfracture medial femoral condyle abrasion condroplasty medial femoral condyle lateral menisectomy;  Surgeon:  Tobi Bastos, MD;  Location: [facility];  Service: Orthopedics;  Laterality: Right;   LEFT HEART CATHETERIZATION WITH CORONARY ANGIOGRAM N/A 10/26/2013   Procedure: LEFT HEART CATHETERIZATION WITH CORONARY ANGIOGRAM;  Surgeon: Jettie Booze, MD;  Location: [facility];  Service: Cardiovascular;  Laterality: N/A;   LUMBAR DISC SURGERY  2001   RIGHT/LEFT HEART CATH AND CORONARY ANGIOGRAPHY N/A 12/17/2018   Progression of LAD dz, not amenable to PCI. R heart cath with normal pressures.  Procedure: RIGHT/LEFT HEART CATH AND CORONARY ANGIOGRAPHY;  Surgeon: Leonie Man, MD;  Location: [facility];  Service: Cardiovascular;  Laterality: N/A;   SHOULDER SURGERY Right    right fx   THORACENTESIS  12/2018   Right->no malignancy   TONSILLECTOMY     TRANSTHORACIC ECHOCARDIOGRAM  03/2016; 11/25/2018   2017: EF 55-60%, grade I DD, LAE.  Jan 2020->new anteroapical akinesis, EF 40-45%-->cath 11/2018 showed no acute changes (progression of LAD dz->this explains the [facility] seen on recent echo.   WRIST SURGERY Right    right fx    I have reviewed the social history and family history with the patient and they are unchanged from previous note.  ALLERGIES:  has No Known Allergies.  MEDICATIONS:  Current Outpatient Medications  Medication Sig Dispense Refill   acetaminophen (TYLENOL) 500 MG tablet Take 1,000 mg by mouth every 6 (six) hours as needed for moderate pain.     atorvastatin (LIPITOR) 80 MG tablet Take 1 tablet by mouth once daily 90 tablet  0   baclofen (LIORESAL) 10 MG tablet TAKE 1/2 TO 1 (ONE-HALF TO ONE) TABLET BY MOUTH EVERY 8 HOURS AS NEEDED FOR MUSCLE SPASM 30 tablet 2   beta carotene w/minerals (OCUVITE) tablet Take 1 tablet by mouth daily.     cetirizine (ZYRTEC) 10 MG tablet Take 10 mg by mouth every evening.      citalopram (CELEXA) 20 MG tablet Take 1 tablet by mouth once daily 90 tablet 1   clonazePAM (KLONOPIN) 1 MG tablet Take 1 tablet by mouth twice daily  as needed for anxiety 60 tablet 5   clotrimazole-betamethasone (LOTRISONE) cream APPLY  CREAM TOPICALLY TO AFFECTED AREA TWICE DAILY 45 g 0   ezetimibe (ZETIA) 10 MG tablet Take 1 tablet by mouth once daily 90 tablet 2   furosemide (LASIX) 40 MG tablet Take 1 tablet every other day alternating with a 1/2 tablet every other day. 90 tablet 3   gabapentin (NEURONTIN) 300 MG capsule TAKE 1 CAPSULE BY MOUTH THREE TIMES DAILY 270 capsule 3   HUMULIN N KWIKPEN 100 UNIT/ML Kiwkpen INJECT 16 UNITS SUBCUTANEOUSLY IN THE MORNING AND 16 IN THE EVENING 30 mL 0   hydrOXYzine (ATARAX/VISTARIL) 25 MG tablet TAKE ONE TABLET BY MOUTH AT SUPPER AND 1 TABLET AT BEDTIME FOR INSOMNIA 60 tablet 6   Insulin Lispro (HUMALOG KWIKPEN) 200 UNIT/ML SOPN Inject 20 Units into the skin 2 (two) times daily. 18 pen 1   magnesium chloride (SLOW-MAG) 64 MG TBEC SR tablet Take 1 tablet (64 mg total) by mouth daily. 60 tablet 0   metFORMIN (GLUCOPHAGE) 1000 MG tablet TAKE 1 TABLET BY MOUTH TWICE DAILY WITH MEALS 180 tablet 0   metoprolol tartrate (LOPRESSOR) 25 MG tablet Take 1/2 (one-half) tablet by mouth twice daily 90 tablet 3   mirtazapine (REMERON) 7.5 MG tablet Take 1 tablet (7.5 mg total) by mouth at bedtime. 30 tablet 1   Omega-3 Fatty Acids (FISH OIL) 1200 MG CAPS Take 2,400 mg by mouth 2 (two) times daily.     ONE TOUCH ULTRA TEST test strip USE 1 STRIP TO CHECK GLUCOSE THREE TIMES DAILY AS DIRECTED 100 each 11   ONETOUCH DELICA LANCETS 77A MISC USE   TO CHECK GLUCOSE THREE TIMES DAILY 100 each 11   OXYGEN Inhale 2 L into the lungs continuous.      pantoprazole (PROTONIX) 40 MG tablet TAKE 1 TABLET BY MOUTH TWICE DAILY 180 tablet 2   No current facility-administered medications for this visit.     PHYSICAL EXAMINATION: ECOG PERFORMANCE STATUS: 2 - Symptomatic, <50% confined to bed  No vitals taken today, Exam not performed today   LABORATORY DATA:  I have reviewed the data as listed CBC Latest Ref  Rng & Units 06/05/2019 12/19/2018 12/17/2018  WBC 4.0 - 10.5 K/uL 7.1 7.0 -  Hemoglobin 13.0 - 17.0 g/dL 9.5(L) 10.2(L) 10.9(L)  Hematocrit 39.0 - 52.0 % 33.2(L) 33.8(L) 32.0(L)  Platelets 150 - 400 K/uL 208 160 -     CMP Latest Ref Rng & Units 06/17/2019 06/12/2019 06/05/2019  Glucose 65 - 99 mg/dL 141(H) 86 173(H)  BUN 8 - 27 mg/dL _0 Creatinine 0.76 - 1.27 mg/dL 0.92 0.72(L) 1.06  Sodium 134 - 144 mmol/L 140 138 138  Potassium 3.5 - 5.2 mmol/L 4.3 5.3(H) 4.0  Chloride 96 - 106 mmol/L 88(L) 86(L) 85(L)  CO2 20 - 29 mmol/L 39(H) 40(H) 38(H)  Calcium 8.6 - 10.2 mg/dL 8.5(L) 8.7 8.5(L)  Total Protein 6.5 - 8.1  g/dL - - 7.5  Total Bilirubin 0.3 - 1.2 mg/dL - - 0.6  Alkaline Phos 38 - 126 U/L - - 84  AST 15 - 41 U/L - - 24  ALT 0 - 44 U/L - - 17      RADIOGRAPHIC STUDIES: I have personally reviewed the radiological images as listed and agreed with the findings in the report. Ct Chest W Contrast  Result Date: 06/19/2019 CLINICAL DATA:  Patient with history of esophageal carcinoma. Follow-up exam. EXAM: CT CHEST, ABDOMEN, AND PELVIS WITH CONTRAST TECHNIQUE: Multidetector CT imaging of the chest, abdomen and pelvis was performed following the standard protocol during bolus administration of intravenous contrast. CONTRAST:  118m ISOVUE-300 IOPAMIDOL (ISOVUE-300) INJECTION 61% COMPARISON:  CT CAP June 16, 2018 FINDINGS: CT CHEST FINDINGS Cardiovascular: Right anterior chest wall Port-A-Cath is present with tip terminating in the superior vena cava. Normal heart size. Trace pericardial fluid. Thoracic aortic vascular calcifications. Mediastinum/Nodes: Interval increase in size of right paratracheal lymph node measuring 12 mm (image 17; series 2), previously 6 mm. Similar-appearing 9 mm posterior right paratracheal lymph node (image 13; series 2). Normal appearance of the esophagus. No axillary lymphadenopathy. Lungs/Pleura: Central airways are patent. Interval increase in size of large  bilateral loculated pleural effusions. There is consolidation involving the lungs bilaterally favored to represent atelectasis with aeration of the upper lobes. Patchy ground-glass opacities throughout the left greater than right upper lobes Musculoskeletal: Thoracic spine degenerative changes. No aggressive or acute appearing osseous lesions. CT ABDOMEN PELVIS FINDINGS Hepatobiliary: Stable nodular hepatic contour compatible with cirrhosis. Stable subcentimeter hypodense lesion hepatic dome (image 38; series 2), compatible with benign process given stability over time. Multiple stones in the gallbladder lumen. No gallbladder wall thickening. Pancreas: Unremarkable Spleen: Unremarkable Adrenals/Urinary Tract: Normal adrenal glands. Stable bilateral renal cyst. Urinary bladder is unremarkable. Stomach/Bowel: The sigmoid colonic diverticulosis. No CT evidence for acute diverticulitis. Similar-appearing mild wall thickening of the cecum and ascending colon, potentially secondary to third spacing of fluid. Normal morphology of the stomach. Vascular/Lymphatic: Normal caliber abdominal aorta. Peripheral calcified atherosclerotic plaque. No retroperitoneal lymphadenopathy. Reproductive: Central dystrophic calcifications in the prostate. Other: Moderate volume ascites.  This is increased from prior exam. Musculoskeletal: Lumbar spine degenerative changes. No aggressive or acute appearing osseous lesions. IMPRESSION: 1. Slight interval increase in size right paratracheal lymph node when compared to prior exam. Cannot exclude early recurrent metastatic disease. 2. Similar-appearing posterior right paratracheal lymph node when compared to prior exam. 3. Interval development of large loculated left pleural effusion and interval increase in size of loculated right pleural effusion. Possibility of malignant effusion not entirely excluded. 4. No findings suggestive of metastatic disease in the abdomen or pelvis. 5. Hepatic  cirrhosis. 6. Mild wall thickening of the cecum and ascending colon which may be secondary to third spacing of fluid. Colitis not excluded. Recommend clinical correlation. Electronically Signed   By: DLovey NewcomerM.D.   On: 06/19/2019 09:43   Ct Abdomen Pelvis W Contrast  Result Date: 06/19/2019 CLINICAL DATA:  Patient with history of esophageal carcinoma. Follow-up exam. EXAM: CT CHEST, ABDOMEN, AND PELVIS WITH CONTRAST TECHNIQUE: Multidetector CT imaging of the chest, abdomen and pelvis was performed following the standard protocol during bolus administration of intravenous contrast. CONTRAST:  1233mISOVUE-300 IOPAMIDOL (ISOVUE-300) INJECTION 61% COMPARISON:  CT CAP June 16, 2018 FINDINGS: CT CHEST FINDINGS Cardiovascular: Right anterior chest wall Port-A-Cath is present with tip terminating in the superior vena cava. Normal heart size. Trace pericardial fluid. Thoracic aortic vascular calcifications.  Mediastinum/Nodes: Interval increase in size of right paratracheal lymph node measuring 12 mm (image 17; series 2), previously 6 mm. Similar-appearing 9 mm posterior right paratracheal lymph node (image 13; series 2). Normal appearance of the esophagus. No axillary lymphadenopathy. Lungs/Pleura: Central airways are patent. Interval increase in size of large bilateral loculated pleural effusions. There is consolidation involving the lungs bilaterally favored to represent atelectasis with aeration of the upper lobes. Patchy ground-glass opacities throughout the left greater than right upper lobes Musculoskeletal: Thoracic spine degenerative changes. No aggressive or acute appearing osseous lesions. CT ABDOMEN PELVIS FINDINGS Hepatobiliary: Stable nodular hepatic contour compatible with cirrhosis. Stable subcentimeter hypodense lesion hepatic dome (image 38; series 2), compatible with benign process given stability over time. Multiple stones in the gallbladder lumen. No gallbladder wall thickening. Pancreas:  Unremarkable Spleen: Unremarkable Adrenals/Urinary Tract: Normal adrenal glands. Stable bilateral renal cyst. Urinary bladder is unremarkable. Stomach/Bowel: The sigmoid colonic diverticulosis. No CT evidence for acute diverticulitis. Similar-appearing mild wall thickening of the cecum and ascending colon, potentially secondary to third spacing of fluid. Normal morphology of the stomach. Vascular/Lymphatic: Normal caliber abdominal aorta. Peripheral calcified atherosclerotic plaque. No retroperitoneal lymphadenopathy. Reproductive: Central dystrophic calcifications in the prostate. Other: Moderate volume ascites.  This is increased from prior exam. Musculoskeletal: Lumbar spine degenerative changes. No aggressive or acute appearing osseous lesions. IMPRESSION: 1. Slight interval increase in size right paratracheal lymph node when compared to prior exam. Cannot exclude early recurrent metastatic disease. 2. Similar-appearing posterior right paratracheal lymph node when compared to prior exam. 3. Interval development of large loculated left pleural effusion and interval increase in size of loculated right pleural effusion. Possibility of malignant effusion not entirely excluded. 4. No findings suggestive of metastatic disease in the abdomen or pelvis. 5. Hepatic cirrhosis. 6. Mild wall thickening of the cecum and ascending colon which may be secondary to third spacing of fluid. Colitis not excluded. Recommend clinical correlation. Electronically Signed   By: Lovey Newcomer M.D.   On: 06/19/2019 09:43     ASSESSMENT & PLAN:  [name] is a 73 y.o. male with   1. Cancer of cardio-esophageal junction, poorly differentiated carcinoma, cTxN2M1 with node metastasis, stage IV -He was diagnosed in 04/2016. He is s/p palliative radiation and first-line Chemocarbo and taxol -His tumor was negative for PD-L1, not a candidate for immunotherapy  -He has been off chemo since8/2018, clinically stable  -He has  developed low appetite and weight loss lately -I reviewed his 11/2018 angio chest he had during his ED visit which showed nodular disease in peritoneum in LUQ which was suspicious of disease spread.   -I personally reviewed and discussed his CT CAP from 06/18/19 with pt and his daughter which shows 2 slightly enlarged right paratracheal LN, cannot exclude cancer recurrence. NED in abdomen and pelvis.  -I recommend PET scan for further evaluation. He is agreeable.  -I discussed his symptoms of fatigue, low appetite, weight loss which can be related to his comorditities especially CHF, as well as possible cancer recurrence.  -I discussed if cancer recurrence his tumor burden is low based on scan. For caner treatment he would not be able to tolerate IV chemo given performance status and comorbodities. I discussed his treatment option is limited, we can try oral chemo such as Xeloda but overall response rate is low.  -After lengthy discussion, he wishes to proceed with PET scan and will decide based on scan results.   -F/u in 2 weeks    2. Low appetite,  Weight loss  -I recommend Mirtazapine to help stimulate his appetite. He is agreeable (06/19/19) -I encouraged him to increase his calorie and carbohydrate intake.   3. Anemia, history of upper GI bleeding from gastric ulcer -Related to recent GI Bleeding from gastric ulcer found on 03/19/18 Upper endoscopy -He was given blood transfusion on 03/19/18  -Has been mild and stable   4. Bilateral plural effusion, R>L  -Possibly related to his CHF, he was on furosemide 10 mg daily before chemo, which has been held since he started chemo. -CT angio chest from 12/19/18 shows b/l pleural infusion progressed and mild enlargement of mid chest LN. Will monitor LN.  -He had thoracentesis in 12/2018 which removed 734m fluid. Cytology was negative for malignancy.  -He will follow up with Pulmonologist first.continue lasix -His 06/05/19 Chest Xray showed Moderate  bilateral pleural effusions. He underwent thoracentesis on 06/11/19, 6534mwas removed. Cytology negative for malignancy.  -His breathing did improve with thoracentesis but still has SOB and significant fatigue with any activity. He will continue to F/u with cardiologist and pulmonologist   4. CAD, diastolic CHF, on continuous oxygen -He will continue follow-up with his cardiologist Dr. TuRadford PaxHis Plavix has been held since the EGD and biopsy, he is on baby aspirin. -On 2 Liters of continuous oxygen canula. -He underwent cardiac cath on 12/17/18, results were good.  -Stable.   5. DM, HTN  -He will continue medication, monitor his sugar and blood pressure at home, and follow-up with his primary care physician. -previously decreased premed dexa before taxol  -DM and HTN Better controlled recently  6. OSA, morbid obesity -We previously discussed healthy diet, I previously encouraged him to be more physically active, and consider home PT -previously encouraged him to follow-up with dietitian during the therapy for nutrition support.  7. Arthralgia -He has baseline osteoarthritis, not physically active -He will use oxycodone 5 mg as needed, constipation management previously reviewed with him. -He is no longer taking aspirin, and his arthritis has not been present lately   8. Liver cirrhosis and ascites  -He had a mild elevated AST in the past. Pt states he has not consumed alcohol in 30 years.  -His prior CT scans showed evidence of liver cirrhosis and also showed mild to moderate ascites -He had no known history of liver disease, this could be related to fatty liver or congestive heart failure.  -Continue diuretics, and follow-up   9. Goal of care discussion, DNR/DNI -We previously discussed the incurable nature of his cancer, and the overall poor prognosis,and he agreed with DNR/DNI -however he has been NED for 1.5 yeas for now it will be reasonable to rever his code status  to full codeif needed   PLAN -PET scan in 1-2 weeks -Lab and f/u in 2 weeks     No problem-specific Assessment & Plan notes found for this encounter.   Orders Placed This Encounter  Procedures   NM PET Image Restag (PS) Skull Base To Thigh    Standing Status:   Future    Standing Expiration Date:   06/18/2020    Order Specific Question:   If indicated for the ordered procedure, I authorize the administration of a radiopharmaceutical per Radiology protocol    Answer:   Yes    Order Specific Question:   Radiology Contrast Protocol - do NOT remove file path    Answer:   \[email or web address]   I discussed the assessment and treatment plan with the patient. The patient was  provided an opportunity to ask questions and all were answered. The patient agreed with the plan and demonstrated an understanding of the instructions.  The patient was advised to call back or seek an in-person evaluation if the symptoms worsen or if the condition fails to improve as anticipated.  I provided 22 minutes of non face-to-face telephone visit time during this encounter, and > 50% was spent counseling as documented under my assessment & plan.    Truitt Merle, MD 06/19/2019   I, Joslyn Devon, am acting as scribe for Truitt Merle, MD.   I have reviewed the above documentation for accuracy and completeness, and I agree with the above.

## 2019-06-19 ENCOUNTER — Encounter: Payer: Self-pay | Admitting: Hematology

## 2019-06-19 ENCOUNTER — Inpatient Hospital Stay (HOSPITAL_BASED_OUTPATIENT_CLINIC_OR_DEPARTMENT_OTHER): Payer: Medicare Other | Admitting: Hematology

## 2019-06-19 DIAGNOSIS — C159 Malignant neoplasm of esophagus, unspecified: Secondary | ICD-10-CM

## 2019-06-19 MED ORDER — MIRTAZAPINE 7.5 MG PO TABS: 7.5000 mg | ORAL_TABLET | Freq: Every day | ORAL | 1 refills | Status: AC

## 2019-06-20 ENCOUNTER — Other Ambulatory Visit: Payer: Self-pay | Admitting: Family Medicine

## 2019-06-22 ENCOUNTER — Telehealth: Payer: Self-pay | Admitting: Hematology

## 2019-06-22 ENCOUNTER — Telehealth: Payer: Self-pay

## 2019-06-22 DIAGNOSIS — J90 Pleural effusion, not elsewhere classified: Secondary | ICD-10-CM

## 2019-06-22 DIAGNOSIS — Z79899 Other long term (current) drug therapy: Secondary | ICD-10-CM

## 2019-06-22 MED ORDER — SPIRONOLACTONE 25 MG PO TABS: 12.5000 mg | ORAL_TABLET | Freq: Every day | ORAL | 3 refills | Status: DC

## 2019-06-22 NOTE — Telephone Encounter (Signed)
-----   Message from Isaiah Serge, NP sent at 06/21/2019  1:10 PM EDT ----- Potassium is normal.  I checked with Dr. Radford Pax and she would like to proceed with spironolactone, this may help prevent return of pl effusion.  Spirolactone 12.5 mg daily and recheck BMP in one week.

## 2019-06-22 NOTE — Telephone Encounter (Signed)
Scheduled appt per 8/21 los.  Spoke with patient daughter and she is aware of the appt date and time.

## 2019-06-22 NOTE — Telephone Encounter (Signed)
Notes recorded by Frederik Schmidt, RN on 06/22/2019 at 1:14 PM EDT  The patient's daughter has been notified of the result and verbalized understanding. All questions (if any) were answered.  Frederik Schmidt, RN 06/22/2019 1:14 PM

## 2019-06-28 ENCOUNTER — Other Ambulatory Visit: Payer: Self-pay | Admitting: Family Medicine

## 2019-06-30 ENCOUNTER — Inpatient Hospital Stay: Payer: Medicare Other | Attending: Hematology

## 2019-06-30 ENCOUNTER — Other Ambulatory Visit: Payer: Self-pay

## 2019-06-30 ENCOUNTER — Inpatient Hospital Stay: Payer: Medicare Other

## 2019-06-30 DIAGNOSIS — Z8501 Personal history of malignant neoplasm of esophagus: Secondary | ICD-10-CM | POA: Diagnosis not present

## 2019-06-30 DIAGNOSIS — Z452 Encounter for adjustment and management of vascular access device: Secondary | ICD-10-CM | POA: Insufficient documentation

## 2019-06-30 DIAGNOSIS — C160 Malignant neoplasm of cardia: Secondary | ICD-10-CM

## 2019-06-30 DIAGNOSIS — Z95828 Presence of other vascular implants and grafts: Secondary | ICD-10-CM

## 2019-06-30 LAB — CBC WITH DIFFERENTIAL (CANCER CENTER ONLY)
Abs Immature Granulocytes: 0.01 10*3/uL (ref 0.00–0.07)
Basophils Absolute: 0.1 10*3/uL (ref 0.0–0.1)
Basophils Relative: 1 %
Eosinophils Absolute: 0.2 10*3/uL (ref 0.0–0.5)
Eosinophils Relative: 3 %
HCT: 33.2 % — ABNORMAL LOW (ref 39.0–52.0)
Hemoglobin: 9.4 g/dL — ABNORMAL LOW (ref 13.0–17.0)
Immature Granulocytes: 0 %
Lymphocytes Relative: 12 %
Lymphs Abs: 0.6 10*3/uL — ABNORMAL LOW (ref 0.7–4.0)
MCH: 26.9 pg (ref 26.0–34.0)
MCHC: 28.3 g/dL — ABNORMAL LOW (ref 30.0–36.0)
MCV: 95.1 fL (ref 80.0–100.0)
Monocytes Absolute: 0.8 10*3/uL (ref 0.1–1.0)
Monocytes Relative: 15 %
Neutro Abs: 3.6 10*3/uL (ref 1.7–7.7)
Neutrophils Relative %: 69 %
Platelet Count: 175 10*3/uL (ref 150–400)
RBC: 3.49 MIL/uL — ABNORMAL LOW (ref 4.22–5.81)
RDW: 19.3 % — ABNORMAL HIGH (ref 11.5–15.5)
WBC Count: 5.2 10*3/uL (ref 4.0–10.5)
nRBC: 0 % (ref 0.0–0.2)

## 2019-06-30 LAB — CMP (CANCER CENTER ONLY)
ALT: 18 U/L (ref 0–44)
AST: 30 U/L (ref 15–41)
Albumin: 2.5 g/dL — ABNORMAL LOW (ref 3.5–5.0)
Alkaline Phosphatase: 69 U/L (ref 38–126)
Anion gap: 8 (ref 5–15)
BUN: 11 mg/dL (ref 8–23)
CO2: 42 mmol/L — ABNORMAL HIGH (ref 22–32)
Calcium: 8.3 mg/dL — ABNORMAL LOW (ref 8.9–10.3)
Chloride: 90 mmol/L — ABNORMAL LOW (ref 98–111)
Creatinine: 0.78 mg/dL (ref 0.61–1.24)
GFR, Est AFR Am: >60 mL/min (ref >60)
GFR, Estimated: >60 mL/min (ref >60)
Glucose, Bld: 92 mg/dL (ref 70–99)
Potassium: 4 mmol/L (ref 3.5–5.1)
Sodium: 140 mmol/L (ref 135–145)
Total Bilirubin: 0.5 mg/dL (ref 0.3–1.2)
Total Protein: 7 g/dL (ref 6.5–8.1)

## 2019-06-30 MED ORDER — HEPARIN SOD (PORK) LOCK FLUSH 100 UNIT/ML IV SOLN
500.0000 [IU] | Freq: Once | INTRAVENOUS | Status: AC | PRN
  Administered 2019-06-30: 500 [IU]
  Filled 2019-06-30: qty 5

## 2019-06-30 MED ORDER — SODIUM CHLORIDE 0.9% FLUSH
10.0000 mL | INTRAVENOUS | Status: DC | PRN
  Administered 2019-06-30: 09:00:00 10 mL
  Filled 2019-06-30: qty 10

## 2019-07-02 ENCOUNTER — Other Ambulatory Visit: Payer: Medicare Other

## 2019-07-02 ENCOUNTER — Inpatient Hospital Stay: Payer: Medicare Other | Admitting: Hematology

## 2019-07-08 ENCOUNTER — Other Ambulatory Visit: Payer: Self-pay

## 2019-07-08 ENCOUNTER — Encounter (HOSPITAL_COMMUNITY)
Admission: RE | Admit: 2019-07-08 | Discharge: 2019-07-08 | Disposition: A | Discharge status: Home / Self Care | Payer: Medicare Other | Source: Ambulatory Visit | Attending: Hematology | Admitting: Hematology

## 2019-07-08 DIAGNOSIS — R0902 Hypoxemia: Secondary | ICD-10-CM | POA: Diagnosis not present

## 2019-07-08 DIAGNOSIS — Z4682 Encounter for fitting and adjustment of non-vascular catheter: Secondary | ICD-10-CM | POA: Diagnosis not present

## 2019-07-08 DIAGNOSIS — Z515 Encounter for palliative care: Secondary | ICD-10-CM | POA: Diagnosis not present

## 2019-07-08 DIAGNOSIS — J189 Pneumonia, unspecified organism: Secondary | ICD-10-CM | POA: Diagnosis not present

## 2019-07-08 DIAGNOSIS — C160 Malignant neoplasm of cardia: Secondary | ICD-10-CM | POA: Diagnosis not present

## 2019-07-08 DIAGNOSIS — A419 Sepsis, unspecified organism: Secondary | ICD-10-CM | POA: Diagnosis not present

## 2019-07-08 DIAGNOSIS — R188 Other ascites: Secondary | ICD-10-CM | POA: Diagnosis not present

## 2019-07-08 DIAGNOSIS — Z9221 Personal history of antineoplastic chemotherapy: Secondary | ICD-10-CM | POA: Diagnosis not present

## 2019-07-08 DIAGNOSIS — N183 Chronic kidney disease, stage 3 (moderate): Secondary | ICD-10-CM | POA: Diagnosis not present

## 2019-07-08 DIAGNOSIS — I5042 Chronic combined systolic (congestive) and diastolic (congestive) heart failure: Secondary | ICD-10-CM | POA: Diagnosis not present

## 2019-07-08 DIAGNOSIS — G9341 Metabolic encephalopathy: Secondary | ICD-10-CM | POA: Diagnosis not present

## 2019-07-08 DIAGNOSIS — J9622 Acute and chronic respiratory failure with hypercapnia: Secondary | ICD-10-CM | POA: Diagnosis not present

## 2019-07-08 DIAGNOSIS — J449 Chronic obstructive pulmonary disease, unspecified: Secondary | ICD-10-CM | POA: Diagnosis not present

## 2019-07-08 DIAGNOSIS — C159 Malignant neoplasm of esophagus, unspecified: Secondary | ICD-10-CM | POA: Insufficient documentation

## 2019-07-08 DIAGNOSIS — J438 Other emphysema: Secondary | ICD-10-CM | POA: Diagnosis not present

## 2019-07-08 DIAGNOSIS — N179 Acute kidney failure, unspecified: Secondary | ICD-10-CM | POA: Diagnosis not present

## 2019-07-08 DIAGNOSIS — R0689 Other abnormalities of breathing: Secondary | ICD-10-CM | POA: Diagnosis not present

## 2019-07-08 DIAGNOSIS — Z66 Do not resuscitate: Secondary | ICD-10-CM | POA: Diagnosis not present

## 2019-07-08 DIAGNOSIS — J918 Pleural effusion in other conditions classified elsewhere: Secondary | ICD-10-CM | POA: Diagnosis not present

## 2019-07-08 DIAGNOSIS — C771 Secondary and unspecified malignant neoplasm of intrathoracic lymph nodes: Secondary | ICD-10-CM | POA: Diagnosis not present

## 2019-07-08 DIAGNOSIS — J9601 Acute respiratory failure with hypoxia: Secondary | ICD-10-CM | POA: Diagnosis not present

## 2019-07-08 DIAGNOSIS — G4733 Obstructive sleep apnea (adult) (pediatric): Secondary | ICD-10-CM | POA: Diagnosis not present

## 2019-07-08 DIAGNOSIS — E1169 Type 2 diabetes mellitus with other specified complication: Secondary | ICD-10-CM | POA: Diagnosis not present

## 2019-07-08 DIAGNOSIS — J90 Pleural effusion, not elsewhere classified: Secondary | ICD-10-CM | POA: Diagnosis not present

## 2019-07-08 DIAGNOSIS — J9621 Acute and chronic respiratory failure with hypoxia: Secondary | ICD-10-CM | POA: Diagnosis not present

## 2019-07-08 DIAGNOSIS — I130 Hypertensive heart and chronic kidney disease with heart failure and stage 1 through stage 4 chronic kidney disease, or unspecified chronic kidney disease: Secondary | ICD-10-CM | POA: Diagnosis not present

## 2019-07-08 DIAGNOSIS — I255 Ischemic cardiomyopathy: Secondary | ICD-10-CM | POA: Diagnosis not present

## 2019-07-08 DIAGNOSIS — R6521 Severe sepsis with septic shock: Secondary | ICD-10-CM | POA: Diagnosis not present

## 2019-07-08 DIAGNOSIS — R0602 Shortness of breath: Secondary | ICD-10-CM | POA: Diagnosis not present

## 2019-07-08 DIAGNOSIS — E785 Hyperlipidemia, unspecified: Secondary | ICD-10-CM | POA: Diagnosis not present

## 2019-07-08 LAB — GLUCOSE, CAPILLARY: Glucose-Capillary: 99 mg/dL (ref 70–99)

## 2019-07-08 MED ORDER — FLUDEOXYGLUCOSE F - 18 (FDG) INJECTION
13.0300 | Freq: Once | INTRAVENOUS | Status: AC
  Administered 2019-07-08: 13.03 via INTRAVENOUS

## 2019-07-09 DIAGNOSIS — J449 Chronic obstructive pulmonary disease, unspecified: Secondary | ICD-10-CM | POA: Diagnosis not present

## 2019-07-10 ENCOUNTER — Encounter (HOSPITAL_COMMUNITY): Payer: Self-pay

## 2019-07-10 ENCOUNTER — Emergency Department (HOSPITAL_COMMUNITY): Payer: Medicare Other

## 2019-07-10 ENCOUNTER — Inpatient Hospital Stay (HOSPITAL_COMMUNITY)
Admission: EM | Admit: 2019-07-10 | Discharge: 2019-07-30 | DRG: 208 | Disposition: E | Payer: Medicare Other | Attending: Pulmonary Disease | Admitting: Pulmonary Disease

## 2019-07-10 ENCOUNTER — Other Ambulatory Visit: Payer: Self-pay

## 2019-07-10 ENCOUNTER — Telehealth: Payer: Self-pay | Admitting: Family Medicine

## 2019-07-10 ENCOUNTER — Telehealth: Payer: Self-pay | Admitting: Cardiology

## 2019-07-10 DIAGNOSIS — C159 Malignant neoplasm of esophagus, unspecified: Secondary | ICD-10-CM | POA: Diagnosis not present

## 2019-07-10 DIAGNOSIS — Z87442 Personal history of urinary calculi: Secondary | ICD-10-CM

## 2019-07-10 DIAGNOSIS — I5042 Chronic combined systolic (congestive) and diastolic (congestive) heart failure: Secondary | ICD-10-CM | POA: Diagnosis present

## 2019-07-10 DIAGNOSIS — I255 Ischemic cardiomyopathy: Secondary | ICD-10-CM | POA: Diagnosis present

## 2019-07-10 DIAGNOSIS — A419 Sepsis, unspecified organism: Secondary | ICD-10-CM

## 2019-07-10 DIAGNOSIS — E785 Hyperlipidemia, unspecified: Secondary | ICD-10-CM | POA: Diagnosis present

## 2019-07-10 DIAGNOSIS — J9621 Acute and chronic respiratory failure with hypoxia: Secondary | ICD-10-CM | POA: Diagnosis present

## 2019-07-10 DIAGNOSIS — Z9981 Dependence on supplemental oxygen: Secondary | ICD-10-CM

## 2019-07-10 DIAGNOSIS — Z794 Long term (current) use of insulin: Secondary | ICD-10-CM

## 2019-07-10 DIAGNOSIS — Z515 Encounter for palliative care: Secondary | ICD-10-CM | POA: Diagnosis not present

## 2019-07-10 DIAGNOSIS — Z20828 Contact with and (suspected) exposure to other viral communicable diseases: Secondary | ICD-10-CM | POA: Diagnosis present

## 2019-07-10 DIAGNOSIS — Z79899 Other long term (current) drug therapy: Secondary | ICD-10-CM

## 2019-07-10 DIAGNOSIS — N179 Acute kidney failure, unspecified: Secondary | ICD-10-CM | POA: Diagnosis present

## 2019-07-10 DIAGNOSIS — G9341 Metabolic encephalopathy: Secondary | ICD-10-CM | POA: Diagnosis not present

## 2019-07-10 DIAGNOSIS — C771 Secondary and unspecified malignant neoplasm of intrathoracic lymph nodes: Secondary | ICD-10-CM | POA: Diagnosis present

## 2019-07-10 DIAGNOSIS — N183 Chronic kidney disease, stage 3 unspecified: Secondary | ICD-10-CM

## 2019-07-10 DIAGNOSIS — R0689 Other abnormalities of breathing: Secondary | ICD-10-CM | POA: Diagnosis not present

## 2019-07-10 DIAGNOSIS — R188 Other ascites: Secondary | ICD-10-CM | POA: Diagnosis present

## 2019-07-10 DIAGNOSIS — I4891 Unspecified atrial fibrillation: Secondary | ICD-10-CM | POA: Diagnosis present

## 2019-07-10 DIAGNOSIS — Z95828 Presence of other vascular implants and grafts: Secondary | ICD-10-CM

## 2019-07-10 DIAGNOSIS — Z66 Do not resuscitate: Secondary | ICD-10-CM | POA: Diagnosis not present

## 2019-07-10 DIAGNOSIS — Z9221 Personal history of antineoplastic chemotherapy: Secondary | ICD-10-CM

## 2019-07-10 DIAGNOSIS — R4182 Altered mental status, unspecified: Secondary | ICD-10-CM

## 2019-07-10 DIAGNOSIS — J948 Other specified pleural conditions: Secondary | ICD-10-CM | POA: Diagnosis not present

## 2019-07-10 DIAGNOSIS — Z8249 Family history of ischemic heart disease and other diseases of the circulatory system: Secondary | ICD-10-CM

## 2019-07-10 DIAGNOSIS — G4733 Obstructive sleep apnea (adult) (pediatric): Secondary | ICD-10-CM | POA: Diagnosis present

## 2019-07-10 DIAGNOSIS — I130 Hypertensive heart and chronic kidney disease with heart failure and stage 1 through stage 4 chronic kidney disease, or unspecified chronic kidney disease: Secondary | ICD-10-CM | POA: Diagnosis present

## 2019-07-10 DIAGNOSIS — J918 Pleural effusion in other conditions classified elsewhere: Secondary | ICD-10-CM | POA: Diagnosis present

## 2019-07-10 DIAGNOSIS — J9622 Acute and chronic respiratory failure with hypercapnia: Secondary | ICD-10-CM | POA: Diagnosis present

## 2019-07-10 DIAGNOSIS — Z8719 Personal history of other diseases of the digestive system: Secondary | ICD-10-CM

## 2019-07-10 DIAGNOSIS — E1151 Type 2 diabetes mellitus with diabetic peripheral angiopathy without gangrene: Secondary | ICD-10-CM | POA: Diagnosis present

## 2019-07-10 DIAGNOSIS — Z8711 Personal history of peptic ulcer disease: Secondary | ICD-10-CM

## 2019-07-10 DIAGNOSIS — R6521 Severe sepsis with septic shock: Secondary | ICD-10-CM

## 2019-07-10 DIAGNOSIS — Z888 Allergy status to other drugs, medicaments and biological substances status: Secondary | ICD-10-CM

## 2019-07-10 DIAGNOSIS — Z6841 Body Mass Index (BMI) 40.0 and over, adult: Secondary | ICD-10-CM

## 2019-07-10 DIAGNOSIS — R0602 Shortness of breath: Secondary | ICD-10-CM | POA: Diagnosis not present

## 2019-07-10 DIAGNOSIS — K7460 Unspecified cirrhosis of liver: Secondary | ICD-10-CM | POA: Diagnosis present

## 2019-07-10 DIAGNOSIS — J189 Pneumonia, unspecified organism: Secondary | ICD-10-CM | POA: Diagnosis present

## 2019-07-10 DIAGNOSIS — J90 Pleural effusion, not elsewhere classified: Secondary | ICD-10-CM

## 2019-07-10 DIAGNOSIS — Z7189 Other specified counseling: Secondary | ICD-10-CM | POA: Diagnosis not present

## 2019-07-10 DIAGNOSIS — Z9889 Other specified postprocedural states: Secondary | ICD-10-CM

## 2019-07-10 DIAGNOSIS — R0902 Hypoxemia: Secondary | ICD-10-CM

## 2019-07-10 DIAGNOSIS — G9340 Encephalopathy, unspecified: Secondary | ICD-10-CM

## 2019-07-10 DIAGNOSIS — J438 Other emphysema: Secondary | ICD-10-CM | POA: Diagnosis present

## 2019-07-10 DIAGNOSIS — C160 Malignant neoplasm of cardia: Secondary | ICD-10-CM | POA: Diagnosis present

## 2019-07-10 DIAGNOSIS — E1169 Type 2 diabetes mellitus with other specified complication: Secondary | ICD-10-CM | POA: Diagnosis present

## 2019-07-10 DIAGNOSIS — Z923 Personal history of irradiation: Secondary | ICD-10-CM

## 2019-07-10 DIAGNOSIS — Z87891 Personal history of nicotine dependence: Secondary | ICD-10-CM

## 2019-07-10 DIAGNOSIS — Z833 Family history of diabetes mellitus: Secondary | ICD-10-CM

## 2019-07-10 DIAGNOSIS — I4510 Unspecified right bundle-branch block: Secondary | ICD-10-CM | POA: Diagnosis present

## 2019-07-10 DIAGNOSIS — I878 Other specified disorders of veins: Secondary | ICD-10-CM | POA: Diagnosis present

## 2019-07-10 DIAGNOSIS — J9601 Acute respiratory failure with hypoxia: Secondary | ICD-10-CM | POA: Diagnosis not present

## 2019-07-10 DIAGNOSIS — Z4682 Encounter for fitting and adjustment of non-vascular catheter: Secondary | ICD-10-CM | POA: Diagnosis not present

## 2019-07-10 DIAGNOSIS — I2510 Atherosclerotic heart disease of native coronary artery without angina pectoris: Secondary | ICD-10-CM | POA: Diagnosis present

## 2019-07-10 DIAGNOSIS — F4322 Adjustment disorder with anxiety: Secondary | ICD-10-CM | POA: Diagnosis present

## 2019-07-10 DIAGNOSIS — E8809 Other disorders of plasma-protein metabolism, not elsewhere classified: Secondary | ICD-10-CM | POA: Diagnosis present

## 2019-07-10 DIAGNOSIS — E1122 Type 2 diabetes mellitus with diabetic chronic kidney disease: Secondary | ICD-10-CM | POA: Diagnosis present

## 2019-07-10 LAB — CBC WITH DIFFERENTIAL/PLATELET
Abs Immature Granulocytes: 0.05 10*3/uL (ref 0.00–0.07)
Basophils Absolute: 0 10*3/uL (ref 0.0–0.1)
Basophils Relative: 0 %
Eosinophils Absolute: 0.1 10*3/uL (ref 0.0–0.5)
Eosinophils Relative: 1 %
HCT: 38.2 % — ABNORMAL LOW (ref 39.0–52.0)
Hemoglobin: 10.1 g/dL — ABNORMAL LOW (ref 13.0–17.0)
Immature Granulocytes: 1 %
Lymphocytes Relative: 9 %
Lymphs Abs: 0.6 10*3/uL — ABNORMAL LOW (ref 0.7–4.0)
MCH: 27.1 pg (ref 26.0–34.0)
MCHC: 26.4 g/dL — ABNORMAL LOW (ref 30.0–36.0)
MCV: 102.4 fL — ABNORMAL HIGH (ref 80.0–100.0)
Monocytes Absolute: 0.8 10*3/uL (ref 0.1–1.0)
Monocytes Relative: 11 %
Neutro Abs: 5.6 10*3/uL (ref 1.7–7.7)
Neutrophils Relative %: 78 %
Platelets: 206 10*3/uL (ref 150–400)
RBC: 3.73 MIL/uL — ABNORMAL LOW (ref 4.22–5.81)
RDW: 19.4 % — ABNORMAL HIGH (ref 11.5–15.5)
WBC: 7.2 10*3/uL (ref 4.0–10.5)
nRBC: 0.6 % — ABNORMAL HIGH (ref 0.0–0.2)

## 2019-07-10 LAB — COMPREHENSIVE METABOLIC PANEL
ALT: 21 U/L (ref 0–44)
AST: 37 U/L (ref 15–41)
Albumin: 2.8 g/dL — ABNORMAL LOW (ref 3.5–5.0)
Alkaline Phosphatase: 74 U/L (ref 38–126)
Anion gap: 11 (ref 5–15)
BUN: 21 mg/dL (ref 8–23)
CO2: 41 mmol/L — ABNORMAL HIGH (ref 22–32)
Calcium: 8.3 mg/dL — ABNORMAL LOW (ref 8.9–10.3)
Chloride: 85 mmol/L — ABNORMAL LOW (ref 98–111)
Creatinine, Ser: 1.31 mg/dL — ABNORMAL HIGH (ref 0.61–1.24)
GFR calc Af Amer: >60 mL/min (ref >60)
GFR calc non Af Amer: 53 mL/min — ABNORMAL LOW (ref >60)
Glucose, Bld: 183 mg/dL — ABNORMAL HIGH (ref 70–99)
Potassium: 5.3 mmol/L — ABNORMAL HIGH (ref 3.5–5.1)
Sodium: 137 mmol/L (ref 135–145)
Total Bilirubin: 0.8 mg/dL (ref 0.3–1.2)
Total Protein: 7.8 g/dL (ref 6.5–8.1)

## 2019-07-10 LAB — BLOOD GAS, ARTERIAL
Delivery systems: POSITIVE
Drawn by: 514251
Expiratory PAP: 6
FIO2: 100
Inspiratory PAP: 14
O2 Saturation: 91.2 %
Patient temperature: 37
RATE: 14 resp/min
pH, Arterial: 7.15 — CL (ref 7.350–7.450)
pO2, Arterial: 73.3 mmHg — ABNORMAL LOW (ref 83.0–108.0)

## 2019-07-10 LAB — CBC
HCT: 33.2 % — ABNORMAL LOW (ref 39.0–52.0)
Hemoglobin: 9 g/dL — ABNORMAL LOW (ref 13.0–17.0)
MCH: 27.4 pg (ref 26.0–34.0)
MCHC: 27.1 g/dL — ABNORMAL LOW (ref 30.0–36.0)
MCV: 100.9 fL — ABNORMAL HIGH (ref 80.0–100.0)
Platelets: 161 10*3/uL (ref 150–400)
RBC: 3.29 MIL/uL — ABNORMAL LOW (ref 4.22–5.81)
RDW: 19.4 % — ABNORMAL HIGH (ref 11.5–15.5)
WBC: 6.1 10*3/uL (ref 4.0–10.5)
nRBC: 0.3 % — ABNORMAL HIGH (ref 0.0–0.2)

## 2019-07-10 LAB — URINALYSIS, ROUTINE W REFLEX MICROSCOPIC
Bilirubin Urine: NEGATIVE
Glucose, UA: NEGATIVE mg/dL
Hgb urine dipstick: NEGATIVE
Ketones, ur: NEGATIVE mg/dL
Nitrite: NEGATIVE
Protein, ur: NEGATIVE mg/dL
Specific Gravity, Urine: 1.04 — ABNORMAL HIGH (ref 1.005–1.030)
pH: 5 (ref 5.0–8.0)

## 2019-07-10 LAB — LACTIC ACID, PLASMA
Lactic Acid, Venous: 2.1 mmol/L (ref 0.5–1.9)
Lactic Acid, Venous: 1 mmol/L (ref 0.5–1.9)

## 2019-07-10 LAB — SAMPLE TO BLOOD BANK

## 2019-07-10 LAB — SARS CORONAVIRUS 2 BY RT PCR (HOSPITAL ORDER, PERFORMED IN ~~LOC~~ HOSPITAL LAB): SARS Coronavirus 2: NEGATIVE

## 2019-07-10 LAB — CBG MONITORING, ED: Glucose-Capillary: 170 mg/dL — ABNORMAL HIGH (ref 70–99)

## 2019-07-10 LAB — PROTIME-INR
INR: 1.1 (ref 0.8–1.2)
Prothrombin Time: 13.6 seconds (ref 11.4–15.2)

## 2019-07-10 LAB — BRAIN NATRIURETIC PEPTIDE: B Natriuretic Peptide: 81.7 pg/mL (ref 0.0–100.0)

## 2019-07-10 MED ORDER — ETOMIDATE 2 MG/ML IV SOLN
INTRAVENOUS | Status: AC | PRN
  Administered 2019-07-10: 20 mg via INTRAVENOUS

## 2019-07-10 MED ORDER — FENTANYL CITRATE (PF) 100 MCG/2ML IJ SOLN
50.0000 ug | Freq: Once | INTRAMUSCULAR | Status: AC
  Administered 2019-07-10: 50 ug via INTRAVENOUS
  Filled 2019-07-10: qty 2

## 2019-07-10 MED ORDER — ENOXAPARIN SODIUM 40 MG/0.4ML ~~LOC~~ SOLN
40.0000 mg | Freq: Every day | SUBCUTANEOUS | Status: DC
  Administered 2019-07-11 (×2): 40 mg via SUBCUTANEOUS
  Filled 2019-07-10 (×2): qty 0.4

## 2019-07-10 MED ORDER — PANTOPRAZOLE SODIUM 40 MG IV SOLR
40.0000 mg | Freq: Every day | INTRAVENOUS | Status: DC
  Administered 2019-07-11 (×2): 40 mg via INTRAVENOUS
  Filled 2019-07-10 (×2): qty 40

## 2019-07-10 MED ORDER — IOHEXOL 350 MG/ML SOLN
100.0000 mL | Freq: Once | INTRAVENOUS | Status: AC | PRN
  Administered 2019-07-10: 100 mL via INTRAVENOUS

## 2019-07-10 MED ORDER — SODIUM CHLORIDE 0.9% FLUSH: 3.0000 mL | Freq: Once | INTRAVENOUS | Status: DC

## 2019-07-10 MED ORDER — LORAZEPAM 2 MG/ML IJ SOLN
2.0000 mg | Freq: Once | INTRAMUSCULAR | Status: AC
  Administered 2019-07-10: 23:00:00 2 mg via INTRAVENOUS
  Filled 2019-07-10: qty 1

## 2019-07-10 MED ORDER — SODIUM CHLORIDE (PF) 0.9 % IJ SOLN
INTRAMUSCULAR | Status: AC
  Administered 2019-07-11: 03:00:00
  Filled 2019-07-10: qty 50

## 2019-07-10 MED ORDER — FENTANYL 2500MCG IN NS 250ML (10MCG/ML) PREMIX INFUSION
0.0000 ug/h | INTRAVENOUS | Status: DC
  Administered 2019-07-10: 125 ug/h via INTRAVENOUS

## 2019-07-10 MED ORDER — SUCCINYLCHOLINE CHLORIDE 20 MG/ML IJ SOLN
INTRAMUSCULAR | Status: AC | PRN
  Administered 2019-07-10: 100 mg via INTRAVENOUS

## 2019-07-10 NOTE — ED Notes (Signed)
Removed upper and lower dentures, labeled at bedside.

## 2019-07-10 NOTE — Sedation Documentation (Signed)
intubation complete

## 2019-07-10 NOTE — ED Notes (Signed)
CRITICAL VALUE STICKER  CRITICAL VALUE: Lactic 2.1  RECEIVER (on-site recipient of call): A.Dameka Younker,RN  DATE & TIME NOTIFIED: 07/02/2019 1805  MESSENGER (representative from lab): Skeet Simmer  MD NOTIFIED: Ria Comment PA  TIME OF NOTIFICATION: 1805  RESPONSE: Awaiting orders.

## 2019-07-10 NOTE — Telephone Encounter (Signed)
Pt's daughter calling with c/o pts lower extremity swelling x 3 weeks. Today it is worse than normal and she believes it is from the decrease in his lasix a few weeks ago. His right foot > left foot. Denies tight shoes and socks.   I advised her I would forward to Dr. Radford Pax for recommendation.

## 2019-07-10 NOTE — ED Notes (Signed)
Family at bedside. 

## 2019-07-10 NOTE — ED Provider Notes (Signed)
Medical screening examination/treatment/procedure(s) were conducted as a shared visit with non-physician practitioner(s) and myself.  I personally evaluated the patient during the encounter.  EKG Interpretation  Date/Time:  Friday July 10 2019 17:33:35 EDT Ventricular Rate:  79 PR Interval:    QRS Duration: 148 QT Interval:  419 QTC Calculation: 481 R Axis:   -35 Text Interpretation:  Atrial fibrillation Right bundle branch block Inferior infarct, old Artifact Confirmed by Fredia Sorrow [id number]) on 07/06/2019 5:47:55 PM   Patient seen by me along with the physician assistant.  Patient came in with significant hypoxia.  But was alert.  Brought in from home.  Patient has a history COPD diabetes, esophageal cancer, history of bilateral pleural effusions.  Followed by hematology oncology.  According to EMS patient had difficulty waking up this morning.  Patient is normally on 4 L of oxygen at home.  Patient oxygen saturations were in the low 80s or upper 70s.  Patient placed on high percent nonrebreather.  Oxygen sats never got better and 85%.  The patient was fairly alert and was able to talk some and follow some commands.  Patient was started on BiPAP.  Venous blood gas showed that he had marked CO2 retention but also his electrolytes showed that his CO2 was markedly elevated.  Probably a chronic retainer.  Patient's vital signs otherwise were okay.  On the BiPAP patient oxygen levels did improve.  Eventually arterial blood gas still showed elevated PCO2.  But patient still fairly alert respiratory did not recommend going had intubating him and I concurred with that.  We will get him admitted on the BiPAP to the hospitalist service.  X-ray was consistent with market and worsening bilateral pleural effusions.  May very well require thoracentesis like he required in the past.   Fredia Sorrow, MD 07/04/2019 2202

## 2019-07-10 NOTE — Telephone Encounter (Signed)
Pts daughter said pt has had some gradual swelling in right leg and woke up with more swelling this morning. Pt does have swelling in left leg also. Pt was hard to arouse this am, EMS was called, vital signs WNL. Pt was started 40mg  one day and 20mg  the next day by cardiology and he was one 40mg  daily. Pts daughter was told to call cardiology and let them know with reducing the medication down that patient is having swelling again. She verbalized understanding

## 2019-07-10 NOTE — Telephone Encounter (Signed)
Pt c/o swelling: STAT is pt has developed SOB within 24 hours  1) How much weight have you gained and in what time span? Unsure unable to weigh patient   2) If swelling, where is the swelling located? Lower legs and feet more on his right side  3) Are you currently taking a fluid pill? yes  4) Are you currently SOB? no  5) Do you have a log of your daily weights (if so, list)? no  6) Have you gained 3 pounds in a day or 5 pounds in a week? Unsure   7) Have you traveled recently? No   Daughter want to know if his lasix should go back 40mg  everyday. She states when he was on the 40 everyday he was doing much better and did have swelling.

## 2019-07-10 NOTE — ED Notes (Signed)
Back from CT, pt pulling at his mask and stated he couldn't breathe while yelling for his sweater. Respiratory at bedside.

## 2019-07-10 NOTE — ED Notes (Signed)
Called respiratory for ABG and transport to CT

## 2019-07-10 NOTE — H&P (Signed)
..   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 Intubated in ED. PCCM asked to admit  History of present illness   ( History obtained from EMR and account from other providers and details from [facility] as patient is intubated)  73 yr old M with Stage IV Cancer of the cardio-esophageal junction s/p chemoradiation, made DNR DNI by Oncology as pt is not a candidate for immunotherapy or additional IV chemotherapy due to multiple comorbidities including CAD, ischemic cardiomyopathy, OSA, IDDM, COPD, HTN and HLD.  His daughter noted this morning that pt was having swelling in his right leg  Per EDP: Pt brought from home with altered mental status.  Per EMS, sister went to wake up patient this morning was having increased difficulty waking him up.  She states that he was more lethargic.  After several attempts of waking patient up, he was awake but was altered and confused, prompting EMS call.  Patient is on home O2 at about 4 L.  Patient unable to tell EDP why he was there. Was initially managed with non-rebreather which brought O2 sat 85% and at that time pt was failry alert and able to speak and follow commands.  Per the EDP documentation pt was started on BiPAP which helped oxygen levels but ABG showed resp acidosis.  Pt is now Endotracheally Intubated in ED post reversal of DNR/DNI Vitals post BP 135/106, HR 96, RR 16, O2 sats 100%.  Past Medical History  .Marland Kitchen Active Ambulatory Problems    Diagnosis Date Noted   Diabetes mellitus without complication ([place]) [id number]   Dyslipidemia 06/24/2007   Morbid obesity ([place]) 09/02/2008   Essential hypertension 05/01/2007   Chronic  diastolic CHF (congestive heart failure) ([place]) 02/25/2009   OSTEOARTHRITIS 05/01/2007   Hypoxemia 01/27/2010   Dyspnea on exertion 09/14/2013   Abnormal cardiovascular stress test 10/16/2013   Coronary atherosclerosis of native coronary artery 11/10/2013   OSA (obstructive sleep apnea) 02/24/2014   Adjustment disorder with anxious mood 04/25/2014   Itchy skin 04/25/2014   Pruritic condition 05/18/2014   Rash and nonspecific skin eruption 06/27/2014   Impaired functional mobility and activity tolerance 08/26/2014   Recurrent falls 08/26/2014   COPD (chronic obstructive pulmonary disease) ([place])    Other emphysema ([place]) 08/26/2014   Scabies 09/10/2014   Right knee pain 10/26/2014   Spasm of muscle, back 06/20/2015   PND (paroxysmal nocturnal dyspnea)    Dyslipidemia associated with type 2 diabetes mellitus ([place])    DM (diabetes mellitus) type II, controlled, with peripheral vascular disorder ([place])    Dysphagia 03/22/2016   O2 dependent 04/10/2016   IDDM (insulin dependent diabetes mellitus) ([place]) 04/10/2016   Mass of esophagus determined by endoscopy    Cancer of cardio-esophageal junction ([place]) 05/18/2016   Hypoglycemia    Dehydration    Malnutrition of moderate degree 08/29/2016   Port catheter in place 10/05/2016   Goals of care, counseling/discussion 11/02/2016   DNR (do not resuscitate) 05/04/2017   GIB (gastrointestinal bleeding) 03/18/2018   Ulcer of esophagus with bleeding 04/03/2018   History of esophagitis    Personal history of esophageal cancer    Ischemic cardiomyopathy 12/10/2018   Pleural effusion on  right 01/20/2019   Resolved Ambulatory Problems    Diagnosis Date Noted   COPD (chronic obstructive pulmonary disease) ([place]) 09/14/2013   Chest pain with moderate risk for cardiac etiology    Shock ([place]) 08/28/2016   Slurred speech    Septic shock ([place]) 08/28/2016   Past Medical History:  Diagnosis Date   Acute upper  GI bleed 02/2018   Asthma    CAD in native artery    Cataract    Cholelithiasis without cholecystitis    Chronic combined systolic and diastolic CHF (congestive heart failure) ([place]) 02/25/2009   Chronic renal insufficiency, stage III (moderate) ([place]) 2017   Cirrhosis, nonalcoholic ([place]) 99991111   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    Coronary artery disease    Dermatitis 05/2014   DIABETES MELLITUS, TYPE II 06/24/2007   Esophageal cancer ([place]) 04/2016   Esophageal cancer (HCC)    Esophageal cancer ([place])    Herpes zoster 12/2018   History of kidney stones    Hyperkalemia 12/2015   HYPERLIPIDEMIA 06/24/2007   Obesity hypoventilation syndrome ([place])    On home oxygen therapy    Pancytopenia due to chemotherapy ([place]) 2018   Presbycusis of both ears 05/2015   RBBB    Visual field defect 05/10/2016    Consults:  07/09/2019>>>PCCM  Procedures:  Endotracheal intubation  Significant Diagnostic Tests:  CT CHEST 9/11 IMPRESSION: 1. Suboptimal exam given partial collimation lung bases. 2. No evidence of central or segmental pulmonary embolism. More distal evaluation is limited due to the extensive atelectatic changes throughout the lungs and respiratory motion artifact. 3. Interval increase in size of bilateral pleural effusions, now moderate to large in size which appear loculated but without abnormal pleural thickening. 4. Some regions of parenchymal hypoattenuation within the atelectatic lung, most notably within the left lower lobe, superimposed infection cannot be excluded. 5. Interlobular septal thickening throughout the remaining aerated portions of the lungs, consistent with interstitial pulmonary edema. 6. Unchanged mediastinal lymphadenopathy. 7. Aortic Atherosclerosis .   CT HEAD 9/11 IMPRESSION: 1. No acute intracranial hemorrhage. 2. Mild chronic microvascular ischemic changes. Focal area of encephalomalacia in the left  parietal lobe. 3. Focal area of thinning and lucency in the left parietal calvarium likely related to prior craniectomy or radiation changes. An infiltrative lesion is less likely  Micro Data:  07/09/2019>>SARSCOV2 negative 07/21/2019>> Blood cx x 2 pending  07/03/2019>> MRSA PCR pending  Antimicrobials:  07/02/2019>>Ceftriaxone 07/08/2019>>Azithromycin    Objective   Blood pressure (!) 124/112, pulse (!) 105, temperature (!) 95.9 F (35.5 C), resp. rate 15, SpO2 (!) 85 %.    Vent Mode: PRVC FiO2 (%):  [100 %] 100 % Set Rate:  [15 bmp-20 bmp] 15 bmp Vt Set:  [550 mL] 550 mL PEEP:  [5 cmH20] 5 cmH20 Plateau Pressure:  [21 cmH20] 21 cmH20   Intake/Output Summary (Last 24 hours) at 07/02/2019 2322 Last data filed at 07/09/2019 2311 Gross per 24 hour  Intake 300 ml  Output --  Net 300 ml   There were no vitals filed for this visit.  Examination: General: intubated and sedated HENT: normocephalic atraumatic ETT in oropharynx Lungs: coarse b/l with rhonci and diffuse crackles Cardiovascular: S1 and S2 appreciated Abdomen: obese distended abdomen w/ striae + fluid wave erythema noted in suprapubic area Extremities: +3 edema in lower ext, cold, +Dp and PT pulses w. doppler Neuro: wakes up agitated when sedation weaned GU: indwelling temp foley Skin: minor excoriations noted on chest, striae abdomen, lower  ext hyperpigmentation of shins. No decub ulcer   Assessment & Plan:  1. Acute Respiratory Failure with Hypoxia>>  secondary to bilateral effusions and interstitial pulmonary edema OSA In the past post thoracentesis has had some improvement in breathing but at baseline was still having SOB and significant fatigue. Endotracheally intubated in ED CT scan r/o PE Shows bilateral loculated effusions. Plan: Continue on full MV support Bronchial Hygiene/ PT Will do a Bedside US to see if there is a window for diagnostic thoracentesis Reviewed previous imaging and EMR Pt has had  repeated thoracentesis 12/2018 and 8/20>> all cytology negative for malignant cells Was on diuresis. PET scan shows inflammatory component SUV 1.0 along most of the pleura However along the right posterior costophrenic angle the SUV is higher >>4.0 so can not r/o malignancy entirely. It is very likely pleural effusions will recur given the presence of ascites and some suspicion of malignant etiology. No need for emergent thoracentesis overnight. On vent oxygenation improved, lungs have acceptable compliance and no dyssynchrony noted.  2. HFrEF Hypotensive post intubation and sedation 2D ECHO in Jan [id number] LVEF [id number] diastolic dysfunction B/l pleural effusions and Ascites BNP 81.7 Plan: Started on Vasopressors (levophed) MAP goal >33mmHg) A- line placement pending Has a Right chest port for central access Receiving Albumin F/u cortisol level if >20 will discontinue stress dose steroids  3. Community Acquired Pneumonia parenchymal hypoattenuation within the atelectatic lung noted on CT chest SARSCOV2 negative Plan:  Check RVP and trach cx Check PCT Continue with Ceftriaxone and Azithromycin  4. Cancer of cardio-esophageal junction, poorly differentiated carcinoma, cTxN2M1 with node metastasis, stage IV Diagnosed in 2017 Received palliative radiation and chemo w/ carbo and taxol Nodular disease noted in the peritoneum on 11/2018 PET scan on 07/08/2019>> Low-grade activity in the distal esophagus Not a candidate for IV chemo due to his co-morbidities, could only take oral Xeloda which has an overall low response.  Was previously DNR/DNI per last Oncology note on 8/21 due to the incurable nature of his cancer and poor prognosis.  5. Liver cirrhosis w/ Ascites Hypoalbuminemia>> Albumin 2.8 Stable nodular hepatic contour on CTA/P in August On CT Chest today Ascites noted No active ETOH use for several years AST 37 ALT 21 INR 1.1 T bili 0.8 Albumin 2.8 WBC 7.2 diff neut 78 and Afebirle on  exam Plan: F/u bedside US  Low suspicion for SBP (no signs of active infection)  6. H/o IDDM Started on ISS while in patient Goal BG 140-180 mg/dl   Best practice:  Diet: NPO Pain/Anxiety/Delirium protocol (if indicated): yes per protocol RASS goal -1 to -2 VAP protocol (if indicated): yes DVT prophylaxis: Lovenox and SCDs GI prophylaxis: PPI Glucose control: has a h/o IDDM >>ISS for BG >180mg /dl Mobility: bedrest Code Status: limited Resuscitation>>previously DNR/DNI now intubated  Discussed with HCPOA>> DNR ( no CPR no defib no code blue ok with vasopressors and intubation) Family Communication: Spoke with Daughter who has HCPOA, Mom has some memory deficits and is reliant on her for care. We discussed pt's clinical condition and medical history. I explained that given his complete picture he is at high risk for decompensation.  We talked about the DNR/DNI that was in place from last Oncology visit due to his incurable cancer, poor prognosis and his inability to tolerate further IV chemo/ I explained that even with thoracentesis/ chest tubes there is a likelihood that these are loculated effusions in there presence of ascites and given the possibility of malignant etiology, there is  a high chance for recurrence.  She (HCPOA) now has a more thorough understanding of the patient's clinical picture We will continue with critical care management and pt is DNR in the event of cardiac arrest Disposition: ICU  Labs   CBC: Recent Labs  Lab 07/23/2019 1720  WBC 7.2  NEUTROABS 5.6  HGB 10.1*  HCT 38.2*  MCV 102.4*  PLT 99991111    Basic Metabolic Panel: Recent Labs  Lab 07/18/2019 1720  NA 137  K 5.3*  CL 85*  CO2 41*  GLUCOSE 183*  BUN 21  CREATININE 1.31*  CALCIUM 8.3*   GFR: CrCl cannot be calculated (Unknown ideal weight.). Recent Labs  Lab 07/11/2019 1720 07/11/2019 1937  WBC 7.2  --   LATICACIDVEN 2.1* 1.0    Liver Function Tests: Recent Labs  Lab 07/09/2019 1720  AST  37  ALT 21  ALKPHOS 74  BILITOT 0.8  PROT 7.8  ALBUMIN 2.8*   No results for input(s): LIPASE, AMYLASE in the last 168 hours. No results for input(s): AMMONIA in the last 168 hours.  ABG    Component Value Date/Time   PHART 7.150 (LL) 07/14/2019 2030   PCO2ART  07/16/2019 2030    CRITICAL RESULT CALLED TO, READ BACK BY AND VERIFIED WITH:   PO2ART 73.3 (L) 07/08/2019 2030   HCO3 PENDING 07/19/2019 1810   TCO2 36 (H) 12/17/2018 1213   O2SAT 91.2 07/14/2019 2030     Coagulation Profile: Recent Labs  Lab 07/09/2019 1720  INR 1.1    Cardiac Enzymes: No results for input(s): CKTOTAL, CKMB, CKMBINDEX, TROPONINI in the last 168 hours.  HbA1C: Hemoglobin A1C  Date/Time Value Ref Range Status  10/24/2018 03:59 PM 6.4 (A) 4.0 - 5.6 % Final  04/24/2018 04:08 PM 5.4 4.0 - 5.6 % Final   HbA1c, POC (controlled diabetic range)  Date/Time Value Ref Range Status  07/25/2018 04:21 PM 6.4 0.0 - 7.0 % Final   Hgb A1c MFr Bld  Date/Time Value Ref Range Status  01/22/2018 10:41 AM 6.5 4.6 - 6.5 % Final    Comment:    Glycemic Control Guidelines for People with Diabetes:Non Diabetic:  <6%Goal of Therapy: <7%Additional Action Suggested:  >8%   03/08/2016 09:46 AM 6.1 4.6 - 6.5 % Final    Comment:    Glycemic Control Guidelines for People with Diabetes:Non Diabetic:  <6%Goal of Therapy: <7%Additional Action Suggested:  >8%     CBG: Recent Labs  Lab 07/08/19 0910 07/02/2019 1719  GLUCAP 99 170*    Review of Systems:   Marland KitchenMarland KitchenReview of Systems  Unable to perform ROS: Intubated   Past Medical History  He,  has a past medical history of Acute upper GI bleed (02/2018), Asthma, CAD in native artery, Cataract, Cholelithiasis without cholecystitis, Chronic combined systolic and diastolic CHF (congestive heart failure) ([place]) (02/25/2009), Chronic renal insufficiency, stage III (moderate) ([place]) (2017), Cirrhosis, nonalcoholic ([place]) ([id number]), 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,  COPD (chronic obstructive pulmonary disease) ([place]), Coronary artery disease, Dermatitis (05/2014), DIABETES MELLITUS, TYPE II (06/24/2007), Esophageal cancer ([place]) (04/2016), Esophageal cancer ([place]), Esophageal cancer ([place]), Essential hypertension (05/01/2007), Herpes zoster (12/2018), History of kidney stones, Hyperkalemia (12/2015), HYPERLIPIDEMIA (06/24/2007), Morbid obesity ([place]), Obesity hypoventilation syndrome ([place]), On home oxygen therapy, OSA (obstructive sleep apnea), OSTEOARTHRITIS (05/01/2007), Pancytopenia due to chemotherapy ([place]) (2018), Pleural effusion on right (08/2017), Pleural effusion on right (09/2017; 2019/2020), Presbycusis of both ears (05/2015), Pruritic condition (05/2014), RBBB, and Visual field defect (05/10/2016).   Surgical History  Past Surgical History:  Procedure Laterality Date   APPENDECTOMY     BALLOON DILATION N/A 05/04/2016   Procedure: BALLOON DILATION;  Surgeon: Jerene Bears, MD;  Location: [facility];  Service: Gastroenterology;  Laterality: N/A;   BIOPSY  03/19/2018   Procedure: BIOPSY;  Surgeon: Jackquline Denmark, MD;  Location: [facility];  Service: Endoscopy;;   CARDIAC CATHETERIZATION     CARPAL TUNNEL RELEASE Left    CATARACT EXTRACTION W/PHACO Right 04/29/2013   Procedure: CATARACT EXTRACTION PHACO AND INTRAOCULAR LENS PLACEMENT ([place]);  Surgeon: Adonis Brook, MD;  Location: [facility];  Service: Ophthalmology;  Laterality: Right;   CATARACT EXTRACTION, BILATERAL     COLONOSCOPY W/ POLYPECTOMY  08/2011   Many polyps--all hyperplastic, severe diverticulosis, int hem.  [employer] hemoccult testing via [facility] 06/22/15 NEG   ESOPHAGOGASTRODUODENOSCOPY N/A 05/04/2016   Procedure: ESOPHAGOGASTRODUODENOSCOPY (EGD);  Surgeon: Jerene Bears, MD;  Location: [facility];  Service: Gastroenterology;  Laterality: N/A;   ESOPHAGOGASTRODUODENOSCOPY (EGD) WITH PROPOFOL N/A 03/19/2018   Gastric ulcers, esoph ulcer (radiation-induced?), bx-->path neg for malignancy or  Barrett's.  GI plans repeat EGD 6-8 wks after 1st.   Procedure: ESOPHAGOGASTRODUODENOSCOPY (EGD) WITH PROPOFOL;  Surgeon: Jackquline Denmark, MD;  [facility];  Service: Endoscopy;  Laterality: N/A;   ESOPHAGOGASTRODUODENOSCOPY (EGD) WITH PROPOFOL N/A 04/28/2018   Esoph ulcer healed.  Grade I and small (< 5 mm) distal esophageal varices.  Mild portal hypertensive gastropathy.  No evidence of malignance.  Procedure: ESOPHAGOGASTRODUODENOSCOPY (EGD) WITH PROPOFOL;  Surgeon: Jerene Bears, MD;  [facility]: [facility];  Service: Gastroenterology;  Laterality: N/A;   IR GENERIC HISTORICAL  10/03/2016   IR US GUIDE VASC ACCESS RIGHT 10/03/2016 Greggory Keen, MD [facility]   IR GENERIC HISTORICAL  10/03/2016   IR FLUORO GUIDE PORT INSERTION RIGHT 10/03/2016 Greggory Keen, MD [facility]   KNEE ARTHROSCOPY WITH MEDIAL MENISECTOMY Right 12/16/2014   Procedure: RIGHT KNEE ARTHROSCOPY WITH MEDIAL MENISECTOMY microfracture medial femoral condyle abrasion condroplasty medial femoral condyle lateral menisectomy;  Surgeon: Tobi Bastos, MD;  Location: [facility];  Service: Orthopedics;  Laterality: Right;   LEFT HEART CATHETERIZATION WITH CORONARY ANGIOGRAM N/A 10/26/2013   Procedure: LEFT HEART CATHETERIZATION WITH CORONARY ANGIOGRAM;  Surgeon: Jettie Booze, MD;  Location: [facility] CATH LAB;  Service: Cardiovascular;  Laterality: N/A;   LUMBAR DISC SURGERY  2001   RIGHT/LEFT HEART CATH AND CORONARY ANGIOGRAPHY N/A 12/17/2018   Progression of LAD dz, not amenable to PCI. R heart cath with normal pressures.  Procedure: RIGHT/LEFT HEART CATH AND CORONARY ANGIOGRAPHY;  Surgeon: Leonie Man, MD;  Location: [facility] LAB;  Service: Cardiovascular;  Laterality: N/A;   SHOULDER SURGERY Right    right fx   THORACENTESIS  12/2018   Right->no malignancy   TONSILLECTOMY     TRANSTHORACIC ECHOCARDIOGRAM  03/2016; 11/25/2018   2017: EF 55-60%, grade I DD, LAE.  Jan 2020->new anteroapical akinesis, EF  40-45%-->cath 11/2018 showed no acute changes (progression of LAD dz->this explains the [facility] seen on recent echo.   WRIST SURGERY Right    right fx     Social History   reports that he quit smoking about 35 years ago. His smoking use included cigarettes, pipe, and cigars. He has a 45.00 pack-year smoking history. His smokeless tobacco use includes chew. He reports that he does not drink alcohol or use drugs.   Family History   His family history includes Alcohol abuse in his father; Aneurysm in his father; Diabetes in his  maternal aunt; Heart attack in his mother. There is no history of Colon cancer.   Allergies Allergies  Allergen Reactions   Spironolactone Other (See Comments)    Disorientation, dizzy, lethargic, trouble breathing     Home Medications  Prior to Admission medications   Medication Sig Start Date End Date Taking? Authorizing Provider  atorvastatin (LIPITOR) 80 MG tablet Take 1 tablet by mouth once daily 04/20/19  Yes Turner, Traci R, MD  baclofen (LIORESAL) 10 MG tablet TAKE 1/2 TO 1 (ONE-HALF TO ONE) TABLET BY MOUTH EVERY 8 HOURS AS NEEDED FOR MUSCLE SPASM Patient taking differently: Take 5-10 mg by mouth every 8 (eight) hours as needed for muscle spasms.  03/17/19  Yes McGowen, Adrian Blackwater, MD  beta carotene w/minerals (OCUVITE) tablet Take 1 tablet by mouth daily.   Yes [provider]  cetirizine (ZYRTEC) 10 MG tablet Take 10 mg by mouth every evening.    Yes [provider]  citalopram (CELEXA) 20 MG tablet Take 1 tablet by mouth once daily 01/26/19  Yes McGowen, Adrian Blackwater, MD  clonazePAM (KLONOPIN) 1 MG tablet Take 1 tablet by mouth twice daily as needed for anxiety 01/26/19  Yes McGowen, Adrian Blackwater, MD  clotrimazole-betamethasone (LOTRISONE) cream APPLY  CREAM TOPICALLY TO AFFECTED AREA TWICE DAILY Patient taking differently: Apply 1 application topically 2 (two) times daily.  12/05/18  Yes McGowen, Adrian Blackwater, MD  ezetimibe (ZETIA) 10 MG tablet Take 1  tablet by mouth once daily 04/06/19  Yes Turner, Eber Hong, MD  furosemide (LASIX) 40 MG tablet Take 1 tablet every other day alternating with a 1/2 tablet every other day. Patient taking differently: Take 20-40 mg by mouth See admin instructions. Alternate between 20mg  and 40 mg every other day 06/05/19  Yes Turner, Traci R, MD  gabapentin (NEURONTIN) 300 MG capsule TAKE 1 CAPSULE BY MOUTH THREE TIMES DAILY Patient taking differently: Take 300 mg by mouth 3 (three) times daily.  12/02/18  Yes McGowen, Adrian Blackwater, MD  HUMULIN N KWIKPEN 100 UNIT/ML Kiwkpen INJECT 16 UNITS SUBCUTANEOUSLY IN THE MORNING AND 16 IN THE EVENING Patient taking differently: Inject 16 Units into the skin 2 (two) times daily.  05/14/19  Yes McGowen, Adrian Blackwater, MD  hydrOXYzine (ATARAX/VISTARIL) 25 MG tablet TAKE ONE TABLET BY MOUTH AT SUPPER AND 1 TABLET AT BEDTIME FOR INSOMNIA Patient taking differently: Take 25 mg by mouth 2 (two) times daily. At Supper and bedtime 10/06/18  Yes McGowen, Adrian Blackwater, MD  Insulin Lispro (HUMALOG KWIKPEN) 200 UNIT/ML SOPN Inject 20 Units into the skin 2 (two) times daily. 05/09/18  Yes McGowen, Adrian Blackwater, MD  magnesium chloride (SLOW-MAG) 64 MG TBEC SR tablet Take 1 tablet (64 mg total) by mouth daily. 09/01/16  Yes Eber Jones, MD  metFORMIN (GLUCOPHAGE) 1000 MG tablet TAKE 1 TABLET BY MOUTH TWICE DAILY WITH MEALS Patient taking differently: Take 1,000 mg by mouth 2 (two) times daily with a meal.  06/29/19  Yes McGowen, Adrian Blackwater, MD  metoprolol tartrate (LOPRESSOR) 25 MG tablet Take 1/2 (one-half) tablet by mouth twice daily Patient taking differently: Take 12.5 mg by mouth 2 (two) times daily.  06/15/19  Yes Bhagat, Bhavinkumar, PA  mirtazapine (REMERON) 7.5 MG tablet Take 1 tablet (7.5 mg total) by mouth at bedtime. 06/19/19  Yes Truitt Merle, MD  Omega-3 Fatty Acids (FISH OIL) 1200 MG CAPS Take 2,400 mg by mouth 2 (two) times daily.   Yes [provider]  pantoprazole (PROTONIX) 40 MG tablet  TAKE 1 TABLET BY MOUTH TWICE DAILY Patient taking differently: Take 40 mg by mouth 2 (two) times daily.  09/30/18  Yes McGowen, Adrian Blackwater, MD  acetaminophen (TYLENOL) 500 MG tablet Take 1,000 mg by mouth every 6 (six) hours as needed for moderate pain.    [provider]  Jonetta Speak LANCETS [id number] MISC USE   TO CHECK GLUCOSE THREE TIMES DAILY 06/24/18   McGowen, Adrian Blackwater, MD  [facility] Xxx ULTRA test strip USE 1 STRIP TO CHECK GLUCOSE THREE TIMES DAILY AS DIRECTED 06/22/19   McGowen, Adrian Blackwater, MD  OXYGEN Inhale 2 L into the lungs continuous.     [provider]   STAFF NOTE  I, Dr Seward Carol have personally reviewed patient's available data, including medical history, events of note, physical examination and test results as part of my evaluation. I have discussed with NP Moshe Cipro and other care providers such as pharmacist, RN and Elink.  In addition,  I personally evaluated patient  The patient is critically ill with multiple organ systems failure and requires high complexity decision making for assessment and support, frequent evaluation and titration of therapies, application of advanced monitoring technologies and extensive interpretation of multiple databases.   Critical Care Time devoted to patient care services described in this note is 65 Minutes. This time reflects time of care of this signee Dr Seward Carol. This critical care time does not reflect procedure time, or teaching time or supervisory time but could involve care discussion time   CC TIME: 65  minutes CODE STATUS: FULL DISPOSITION: ICU PROGNOSIS: Poor   Dr. Seward Carol Pulmonary Critical Care Medicine  07/11/2019 1:16 AM    Critical care time: 65 mins

## 2019-07-10 NOTE — ED Provider Notes (Signed)
CRITICAL CARE Performed by: Fredia Sorrow Total critical care time: 45 minutes Critical care time was exclusive of separately billable procedures and treating other patients. Critical care was necessary to treat or prevent imminent or life-threatening deterioration. Critical care was time spent personally by me on the following activities: development of treatment plan with patient and/or surrogate as well as nursing, discussions with consultants, evaluation of patient's response to treatment, examination of patient, obtaining history from patient or surrogate, ordering and performing treatments and interventions, ordering and review of laboratory studies, ordering and review of radiographic studies, pulse oximetry and re-evaluation of patient's condition.   Patient had admission arranged for stepdown unit with hospitalist.  Within patient became very somnolent.  Consistent with needing intubation.  He had a heme-onc note from August that suggested they had read discussed DNR/DNI and patient seemed to agree.  But then there was some question whether it should be reversed.  So we contact family members.  It is a thought that his breathing probably was reversible.  They wanted him intubated.  Patient was intubated without any difficulty.  Patient was intubated by physician assistant.  With me present at the time.  Intubation went well without any difficulties.  Patient was sedated with etomidate and succinylcholine.  Patient was intubated with 7-1/2 tube.  Good color change.  Follow-up chest x-ray is pending.  Patient will be admitted to ICU.   Fredia Sorrow, MD 07/13/2019 2250

## 2019-07-10 NOTE — ED Notes (Signed)
Patient transported to CT via stretcher, respiratory at bedside as well as Therapist, sports.

## 2019-07-10 NOTE — ED Triage Notes (Addendum)
Son reports this morning sister could not wake patient up.   EMS was called out.  Patient was not able to write his name and could not remember recent events.    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    Patient is normally on 2 litters 02 all the time.   78% on 2 litters in triage.    Please call son when he can come back - [phone].

## 2019-07-10 NOTE — Telephone Encounter (Signed)
Daughter Delores requesting [facility] appt today with Dr. Anitra Lauth. I told her he was not in office. She reports patient's leg is swollen. She also thinks patient is having a med reaction.  Please call her [phone] as soon as possible so we can schedule with another [facility] provider today if needed.  Thank you

## 2019-07-10 NOTE — ED Notes (Signed)
Patient experiencing increased work of breathing and decreased oxygen saturation. Dr Rogene Houston at bedside. Respiratory called to bedside. Patient on 15L NRBM

## 2019-07-10 NOTE — ED Notes (Signed)
ED TO INPATIENT HANDOFF REPORT  ED Nurse Name and Phone #:  Leafy Kindle, [phone]  S Name/Age/Gender [name] 73 y.o. male Room/Bed: WA16/WA16  Code Status   Code Status: Partial Code  Home/SNF/Other Home Patient oriented to: self Is this baseline? No   Triage Complete: Triage complete  Chief Complaint Lethargy weakness and Pain  Triage Note Son reports this morning sister could not wake patient up.   EMS was called out.  Patient was not able to write his name and could not remember recent events.    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    Patient is normally on 2 litters 02 all the time.   78% on 2 litters in triage.    Please call son when he can come back - [phone].    Allergies Allergies  Allergen Reactions  . Spironolactone Other (See Comments)    Disorientation, dizzy, lethargic, trouble breathing    Level of Care/Admitting Diagnosis ED Disposition    ED Disposition Condition [place]: [place] [[id number]]  Level of Care: ICU [6]  Covid Evaluation: Confirmed COVID Negative  Diagnosis: Acute respiratory failure with hypoxia [facility]) [[id number]]  Admitting Physician: Kandice Hams [[id number]]  Attending Physician: Kandice Hams [[id number]]  Estimated length of stay: 5 - 7 days  Certification:: I certify this patient will need inpatient services for at least 2 midnights  PT Class (Do Not Modify): Inpatient [101]  PT Acc Code (Do Not Modify): Private [1]       B Medical/Surgery History Past Medical History:  Diagnosis Date  . Acute upper GI bleed 02/2018   Transfused 4 U pRBCs-->EGD showed an esoph ulcer and some gastric ulcers (?radiation-induced), path showed no malignancy or Barrett's.  Rpt EGD 04/28/18: Grade I and small (< 5 mm) distal esophageal varices.  Esoph ulcer healed.  Mild portal hypert gastrop.  No sign of malignancy.  .  Asthma    as a child  . CAD in native artery    LAD occluded since 2014->a. cardiac cath 09/2013 showed severely diseased mLAD and diagonal, mild LCx disease, moderate RCA disease, LVEDP 20, EF 50%. 11/2018 cath->progression of LAD dz, not amenable to PCI  . Cataract    multiple types, bilateral  . Cholelithiasis without cholecystitis   . Chronic combined systolic and diastolic CHF (congestive heart failure) ([place]) 02/25/2009   EF 40-45% 2020; cath 11/2018 showed stable chronic occlusion of LAD w/collateral circ.  Since 2020 echo showed decrease in EF, low dose metop added w/plan to add ACE/ARB  . Chronic renal insufficiency, stage III (moderate) (HCC) 2017   Stage II/III (GFR around 60)  . 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  CT 11/2017 w/ascites and persistent bilat pleural effusions  . 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 COPD (chronic obstructive pulmonary disease) ([place])   . Coronary artery disease    chronically occluded LAD and diagonal with right to left collaterals, mild disease in the left circ and moderate disease in the mid RCA on medical management with Imdur, ASA, and Plavix.  . Dermatitis 05/2014  . DIABETES MELLITUS, TYPE II 06/24/2007   No diab retpthy as of 08/05/15 eye exam.  . 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 Pyrtle--EGD).  CT C/A/P showed metastatic adenopathy in mediastinum and  upper abdomen 05/09/16.  Tx plan is palliative radiation (completed 06/22/16), then palliative systemic chemotherapy (carbo+taxol) was started but as of 08/03/16 onc f/u this was held due to severe knee and ankle arthralgias.  Restarting as of 10/2015 (08/2016 CT showed some disease regression  . Esophageal cancer ([place])    CT 12/2016 showed no residual esoph mass--plan to continue chemo.  Ongoing palliative chemo with carboplatin and taxol q 2 wks as of 03/2017 onc f/u (CT 12/2016 and 04/2017  showed no progression of dz).  Taking a 2 mo break from chemo as of 05/2017.  Pleural effusion-- improved with lasix but onc wanted him to get dx/therap thoracentesis-pt declined.  CT C/A/P no progression/recurr/mets 11/2017.  Marland Kitchen Esophageal cancer ([place])    11/2018 surveillance CTs showed no evidence of dz recurrence.  . Essential hypertension 05/01/2007   Qualifier: Diagnosis of  By: Tiney Rouge CMA, Ellison Hughs     . Herpes zoster 12/2018   Valtrex  . History of kidney stones   . Hyperkalemia 12/2015   Decreased ACE-I by 50% in response, then potassium normalized.  Marland Kitchen HYPERLIPIDEMIA 06/24/2007   03/2018 lipids at goal  . Morbid obesity ([place])   . Obesity hypoventilation syndrome (HCC)    oxygen 24/7 (2 liters [place] as of 02/2016)  . On home oxygen therapy    Oxygen @ 2l/m nasally 24/7 hours  . OSA (obstructive sleep apnea)    not tested; pt scored 4 per stop bang tool results sent to PCP   . OSTEOARTHRITIS 05/01/2007  . Pancytopenia due to chemotherapy ([place]) 2018  . Pleural effusion on right 08/2017   ? malignant vs CHF: pt refuses diagnostic thoracentesis b/c he states he feels fine, wants to try diuretic first.  . Pleural effusion on right 09/2017; 2019/2020   Thoracentesis.  Most recent thoracentesis 12/2018->no malignancy.  . Presbycusis of both ears 05/2015   [facility]  . Pruritic condition 05/2014   Allergist summer 2015, no new testing.  Marland Kitchen RBBB   . Visual field defect 05/10/2016   Per Dr. Melina Fiddler, O.D.: Rt, loss of inf/temp quad and some loss sup/temp.  ?CVA  ? Pituitary tumor? 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   Past Surgical History:  Procedure Laterality Date  . APPENDECTOMY    . BALLOON DILATION N/A 05/04/2016   Procedure: BALLOON DILATION;  Surgeon: Jerene Bears, MD;  Location: [facility];  Service: Gastroenterology;  Laterality: N/A;  . BIOPSY  03/19/2018   Procedure: BIOPSY;  Surgeon: Jackquline Denmark, MD;  Location: [facility];  Service:  Endoscopy;;  . CARDIAC CATHETERIZATION    . CARPAL TUNNEL RELEASE Left   . CATARACT EXTRACTION W/PHACO Right 04/29/2013   Procedure: CATARACT EXTRACTION PHACO AND INTRAOCULAR LENS PLACEMENT (IOC);  Surgeon: Adonis Brook, MD;  Location: [facility];  Service: Ophthalmology;  Laterality: Right;  . CATARACT EXTRACTION, BILATERAL    . COLONOSCOPY W/ POLYPECTOMY  08/2011   Many polyps--all hyperplastic, severe diverticulosis, int hem.  [employer] hemoccult testing via [facility] 06/22/15 NEG  . ESOPHAGOGASTRODUODENOSCOPY N/A 05/04/2016   Procedure: ESOPHAGOGASTRODUODENOSCOPY (EGD);  Surgeon: Jerene Bears, MD;  [facility];  Service: Gastroenterology;  Laterality: N/A;  . ESOPHAGOGASTRODUODENOSCOPY (EGD) WITH PROPOFOL N/A 03/19/2018   Gastric ulcers, esoph ulcer (radiation-induced?), bx-->path neg for malignancy or Barrett's.  GI plans repeat EGD 6-8 wks after 1st.   Procedure: ESOPHAGOGASTRODUODENOSCOPY (EGD) WITH PROPOFOL;  Surgeon: Jackquline Denmark, MD;  [facility];  Service: Endoscopy;  Laterality: N/A;  . ESOPHAGOGASTRODUODENOSCOPY (EGD) WITH PROPOFOL N/A 04/28/2018   Esoph ulcer healed.  Grade I and small (< 5 mm) distal esophageal varices.  Mild portal hypertensive gastropathy.  No evidence of malignance.  Procedure: ESOPHAGOGASTRODUODENOSCOPY (EGD) WITH PROPOFOL;  Surgeon: Jerene Bears, MD;  [facility];  Service: Gastroenterology;  Laterality: N/A;  . IR GENERIC HISTORICAL  10/03/2016   IR US GUIDE VASC ACCESS RIGHT 10/03/2016 Greggory Keen, MD [facility]  . IR GENERIC HISTORICAL  10/03/2016   IR FLUORO GUIDE PORT INSERTION RIGHT 10/03/2016 Greggory Keen, MD [facility]  . KNEE ARTHROSCOPY WITH MEDIAL MENISECTOMY Right 12/16/2014   Procedure: RIGHT KNEE ARTHROSCOPY WITH MEDIAL MENISECTOMY microfracture medial femoral condyle abrasion condroplasty medial femoral condyle lateral menisectomy;  Surgeon: Tobi Bastos, MD;  [facility];  Service: Orthopedics;  Laterality: Right;   . LEFT HEART CATHETERIZATION WITH CORONARY ANGIOGRAM N/A 10/26/2013   Procedure: LEFT HEART CATHETERIZATION WITH CORONARY ANGIOGRAM;  Surgeon: Jettie Booze, MD;  Location: [facility] C[facility] LAB;  Service: Cardiovascular;  Laterality: N/A;  . LUMBAR [place] SURGERY  2001  . RIGHT/LEFT HEART CATH AND CORONARY ANGIOGRAPHY N/A 12/17/2018   Progression of LAD dz, not amenable to PCI. R heart cath with normal pressures.  Procedure: RIGHT/LEFT HEART CATH AND CORONARY ANGIOGRAPHY;  Surgeon: Leonie Man, MD;  Location: [place] CV LAB;  Service: Cardiovascular;  Laterality: N/A;  . SHOULDER SURGERY Right    right fx  . THORACENTESIS  12/2018   Right->no malignancy  . TONSILLECTOMY    . TRANSTHORACIC ECHOCARDIOGRAM  03/2016; 11/25/2018   2017: EF 55-60%, grade I DD, LAE.  Jan 2020->new anteroapical akinesis, EF 40-45%-->cath 11/2018 showed no acute changes (progression of LAD dz->this explains the [facility] seen on recent echo.  . WRIST SURGERY Right    right fx     A IV Location/Drains/Wounds Patient Lines/Drains/Airways Status   Active Line/Drains/Airways    Name:   Placement date:   Placement time:   Site:   Days:   Implanted Port 10/03/16 Right Chest   10/03/16    1535    Chest   1010   NG/OG Tube Orogastric 16 Fr. Aucultation Measured external length of tube   07/01/2019    2302    -   less than 1   Urethral Catheter [place] G Temperature probe 16 Fr.   07/11/2019    2310    Temperature probe   less than 1          Intake/Output Last 24 hours  Intake/Output Summary (Last 24 hours) at 07/26/2019 2342 Last data filed at 07/14/2019 2311 Gross per 24 hour  Intake 300 ml  Output -  Net 300 ml    Labs/Imaging Results for orders placed or performed during the hospital encounter of 07/19/2019 (from the past 48 hour(s))  Urinalysis, Routine w reflex microscopic     Status: Abnormal   Collection Time: 07/08/2019  5:06 PM  Result Value Ref Range   Color, Urine YELLOW YELLOW   APPearance HAZY (A) CLEAR    Specific Gravity, Urine 1.040 (H) 1.005 - 1.030   pH 5.0 5.0 - 8.0   Glucose, UA NEGATIVE NEGATIVE mg/dL   Hgb urine dipstick NEGATIVE NEGATIVE   Bilirubin Urine NEGATIVE NEGATIVE   Ketones, ur NEGATIVE NEGATIVE mg/dL   Protein, ur NEGATIVE NEGATIVE mg/dL   Nitrite NEGATIVE NEGATIVE   Leukocytes,Ua SMALL (A) NEGATIVE   RBC / HPF 0-5 0 - 5 RBC/hpf   WBC,  UA 11-20 0 - 5 WBC/hpf   Bacteria, UA RARE (A) NONE SEEN   Squamous Epithelial / LPF 0-5 0 - 5   Mucus PRESENT    Hyaline Casts, UA PRESENT    Granular Casts, UA PRESENT     Comment: Performed at [facility], [place] [address] [place], [place] [address]  CBG monitoring, ED     Status: Abnormal   Collection Time: 07/06/2019  5:19 PM  Result Value Ref Range   Glucose-Capillary 170 (H) 70 - 99 mg/dL  Comprehensive metabolic panel     Status: Abnormal   Collection Time: 07/09/2019  5:20 PM  Result Value Ref Range   Sodium 137 135 - 145 mmol/L   Potassium 5.3 (H) 3.5 - 5.1 mmol/L   Chloride 85 (L) 98 - 111 mmol/L   CO2 41 (H) 22 - 32 mmol/L   Glucose, Bld 183 (H) 70 - 99 mg/dL   BUN 21 8 - 23 mg/dL   Creatinine, Ser 1.31 (H) 0.61 - 1.24 mg/dL   Calcium 8.3 (L) 8.9 - 10.3 mg/dL   Total Protein 7.8 6.5 - 8.1 g/dL   Albumin 2.8 (L) 3.5 - 5.0 g/dL   AST 37 15 - 41 U/L   ALT 21 0 - 44 U/L   Alkaline Phosphatase 74 38 - 126 U/L   Total Bilirubin 0.8 0.3 - 1.2 mg/dL   GFR calc non Af Amer 53 (L) >60 mL/min   GFR calc Af Amer >60 >60 mL/min   Anion gap 11 5 - 15    Comment: Performed at [facility], [place] [address] [place], [place] [address]  Lactic acid, plasma     Status: Abnormal   Collection Time: 07/02/2019  5:20 PM  Result Value Ref Range   Lactic Acid, Venous 2.1 (HH) 0.5 - 1.9 mmol/L    Comment: CRITICAL RESULT CALLED TO, READ BACK BY AND VERIFIED WITH: EVANS,M RN _0  ON 07/25/2019 JACKSON,K Performed at [facility], [place] [address], [place], [place] [address]   CBC  with Differential     Status: Abnormal   Collection Time: 07/08/2019  5:20 PM  Result Value Ref Range   WBC 7.2 4.0 - 10.5 K/uL   RBC 3.73 (L) 4.22 - 5.81 MIL/uL   Hemoglobin 10.1 (L) 13.0 - 17.0 g/dL   HCT 38.2 (L) 39.0 - 52.0 %   MCV 102.4 (H) 80.0 - 100.0 fL   MCH 27.1 26.0 - 34.0 pg   MCHC 26.4 (L) 30.0 - 36.0 g/dL   RDW 19.4 (H) 11.5 - 15.5 %   Platelets 206 150 - 400 K/uL   nRBC 0.6 (H) 0.0 - 0.2 %   Neutrophils Relative % 78 %   Neutro Abs 5.6 1.7 - 7.7 K/uL   Lymphocytes Relative 9 %   Lymphs Abs 0.6 (L) 0.7 - 4.0 K/uL   Monocytes Relative 11 %   Monocytes Absolute 0.8 0.1 - 1.0 K/uL   Eosinophils Relative 1 %   Eosinophils Absolute 0.1 0.0 - 0.5 K/uL   Basophils Relative 0 %   Basophils Absolute 0.0 0.0 - 0.1 K/uL   Immature Granulocytes 1 %   Abs Immature Granulocytes 0.05 0.00 - 0.07 K/uL    Comment: Performed at [facility], [place] [address], [place],  [address]  Protime-INR     Status: None   Collection Time: 07/26/2019  5:20 PM  Result Value Ref Range   Prothrombin Time 13.6 11.4 -  15.2 seconds   INR 1.1 0.8 - 1.2    Comment: (NOTE) INR goal varies based on device and disease states. Performed at [facility], [place] [address] [place], [place] [address]   Brain natriuretic peptide     Status: None   Collection Time: 06/30/2019  5:20 PM  Result Value Ref Range   B Natriuretic Peptide 81.7 0.0 - 100.0 pg/mL    Comment: Performed at [facility], [place] [address], [place], [place] [address]  Sample to Blood Bank     Status: None   Collection Time: 07/23/2019  5:43 PM  Result Value Ref Range   Blood Bank Specimen SAMPLE AVAILABLE FOR TESTING    Sample Expiration      07/13/2019,2359 Performed at [facility], [place] [address] [place], [place] [address]   Blood gas, venous (at [facility] and [facility], not at [facility])     Status: Abnormal (Preliminary result)   Collection Time: 07/14/2019  6:10 PM  Result  Value Ref Range   pH, Ven 7.149 (LL) 7.250 - 7.430    Comment: CRITICAL RESULT CALLED TO, READ BACK BY AND VERIFIED WITH: REPORTED TO dR. ZACKOWSKI AT 1823 ON 07/11/2019 BY T. BURGESS, RRT, RCP    pCO2, Ven PENDING 44.0 - 60.0 mmHg   pO2, Ven  32.0 - 45.0 mmHg    CRITICAL RESULT CALLED TO, READ BACK BY AND VERIFIED WITH:    Comment: ABOVE REPORTABLE RANGE, DR. ZACKOWSKI AT 1823 ON 07/03/2019 BY T. BURGESS, RRT, RCP   Bicarbonate PENDING 20.0 - 28.0 mmol/L   O2 Saturation 62.0 %   Patient temperature 37.0    Collection site VEIN    Drawn by DRAWN BY RN    Sample type VEIN     Comment: Performed at [facility], [place] [address], [place], [place] [address]  SARS Coronavirus 2 [facility] order, Performed in [facility] hospital lab) Nasopharyngeal Nasopharyngeal Swab     Status: None   Collection Time: 07/18/2019  6:19 PM   Specimen: Nasopharyngeal Swab  Result Value Ref Range   SARS Coronavirus 2 NEGATIVE NEGATIVE    Comment: (NOTE) If result is NEGATIVE SARS-CoV-2 target nucleic acids are NOT DETECTED. The SARS-CoV-2 RNA is generally detectable in upper and lower  respiratory specimens during the acute phase of infection. The lowest  concentration of SARS-CoV-2 viral copies this assay can detect is 250  copies / mL. A negative result does not preclude SARS-CoV-2 infection  and should not be used as the sole basis for treatment or other  patient management decisions.  A negative result may occur with  improper specimen collection / handling, submission of specimen other  than nasopharyngeal swab, presence of viral mutation(s) within the  areas targeted by this assay, and inadequate number of viral copies  (<250 copies / mL). A negative result must be combined with clinical  observations, patient history, and epidemiological information. If result is POSITIVE SARS-CoV-2 target nucleic acids are DETECTED. The SARS-CoV-2 RNA is generally detectable in upper and lower   respiratory specimens dur ing the acute phase of infection.  Positive  results are indicative of active infection with SARS-CoV-2.  Clinical  correlation with patient history and other diagnostic information is  necessary to determine patient infection status.  Positive results do  not rule out bacterial infection or co-infection with other viruses. If result is PRESUMPTIVE POSTIVE SARS-CoV-2 nucleic acids MAY BE PRESENT.   A presumptive positive result was obtained  on the submitted specimen  and confirmed on repeat testing.  While 2019 novel coronavirus  (SARS-CoV-2) nucleic acids may be present in the submitted sample  additional confirmatory testing may be necessary for epidemiological  and / or clinical management purposes  to differentiate between  SARS-CoV-2 and other Sarbecovirus currently known to infect humans.  If clinically indicated additional testing with an alternate test  methodology [id number]) is advised. The SARS-CoV-2 RNA is generally  detectable in upper and lower respiratory sp ecimens during the acute  phase of infection. The expected result is Negative. Fact Sheet for Patients:  StrictlyIdeas.no Fact Sheet for Healthcare Providers: BankingDealers.co.za This test is not yet approved or cleared by the [place] FDA and has been authorized for detection and/or diagnosis of SARS-CoV-2 by FDA under an Emergency Use Authorization (EUA).  This EUA will remain in effect (meaning this test can be used) for the duration of the COVID-19 declaration under Section 564(b)(1) of the Act, 21 U.S.C. section 360bbb-3(b)(1), unless the authorization is terminated or revoked sooner. Performed at [facility], [place] [address] [place], [place] [address]   Lactic acid, plasma     Status: None   Collection Time: 06/30/2019  7:37 PM  Result Value Ref Range   Lactic Acid, Venous 1.0 0.5 - 1.9 mmol/L    Comment:  Performed at [facility], [place] [address], [place], [place] [address]  Blood gas, arterial (at [facility] & [facility])     Status: Abnormal   Collection Time: 07/19/2019  8:30 PM  Result Value Ref Range   FIO2 100.00    Delivery systems BILEVEL POSITIVE AIRWAY PRESSURE    LHR 14 resp/min   Inspiratory PAP 14    Expiratory PAP 6    pH, Arterial 7.150 (LL) 7.350 - 7.450    Comment: CRITICAL RESULT CALLED TO, READ BACK BY AND VERIFIED WITH: CALLED TO BECKY RN,BY KELLY JARRELL, RRT ON 07/13/2019 AT 2100    pCO2 arterial  32.0 - 48.0 mmHg    CRITICAL RESULT CALLED TO, READ BACK BY AND VERIFIED WITH:    Comment:  ABOVE REPORTABLE RANGE, CALLED TO BECKY AT 2100 BY KELLY JARRELL, RRT,RCP ON 07/11/2019   pO2, Arterial 73.3 (L) 83.0 - 108.0 mmHg   O2 Saturation 91.2 %   Patient temperature 37.0    Collection site RIGHT RADIAL    Drawn by [id number]    Sample type ARTERIAL DRAW    Allens test (pass/fail) PASS PASS    Comment: Performed at [facility], [place] [address] [place], [place] [address]   Ct Head Wo Contrast  Result Date: 07/05/2019 CLINICAL DATA:  73 year old male with altered mental status. History of esophageal malignancy. EXAM: CT HEAD WITHOUT CONTRAST TECHNIQUE: Contiguous axial images were obtained from the base of the skull through the vertex without intravenous contrast. COMPARISON:  None. FINDINGS: Brain: There is an area of encephalomalacia in the left parietal lobe. The ventricles and sulci are otherwise appropriate size for patient's age. Mild periventricular and deep white matter chronic microvascular ischemic changes noted. There is no acute intracranial hemorrhage. No mass effect or midline shift. No extra-axial fluid collection. Vascular: No hyperdense vessel or unexpected calcification. Skull: Focal area of thinning and lucency in the left parietal calvarium may represent postsurgical changes of craniectomy or other radiation changes. An infiltrative lesion is  less likely. Correlation with history of prior surgery and direct comparison with prior CTs, if available, recommended. No acute calvarial pathology. Sinuses/Orbits: No  acute finding. Other: None IMPRESSION: 1. No acute intracranial hemorrhage. 2. Mild chronic microvascular ischemic changes. Focal area of encephalomalacia in the left parietal lobe. 3. Focal area of thinning and lucency in the left parietal calvarium likely related to prior craniectomy or radiation changes. An infiltrative lesion is less likely. Correlation with history of prior surgery and direct comparison with prior CTs, if available, recommended. Electronically Signed   By: Anner Crete M.D.   On: 07/03/2019 20:53   Ct Angio Chest Pe W And/or Wo Contrast  Result Date: 07/02/2019 CLINICAL DATA:  Shortness of breath, history of GE junction adenocarcinoma EXAM: CT ANGIOGRAPHY CHEST WITH CONTRAST TECHNIQUE: Multidetector CT imaging of the chest was performed using the standard protocol during bolus administration of intravenous contrast. Multiplanar CT image reconstructions and MIPs were obtained to evaluate the vascular anatomy. CONTRAST:  157m OMNIPAQUE IOHEXOL 350 MG/ML SOLN COMPARISON:  CT 12/10/2018 FINDINGS: Cardiovascular: Satisfactory opacification of the pulmonary arteries to the segmental level. More distal evaluation is limited due to the extensive atelectatic changes throughout the lungs and respiratory motion artifact. No central or segmental pulmonary embolism is identified. Central pulmonary artery is borderline enlarged. Atherosclerotic plaque within the normal caliber aorta. Normal 3 vessel branching aortic arch major venous structures are unremarkable aside from a tunneled right IJ approach Port-A-Cath with tip positioned near the superior cavoatrial junction. Normal heart size. No pericardial effusion. Atherosclerotic calcification of the coronary arteries. Calcifications are present upon the aortic leaflets and within the  mitral annulus. Mediastinum/Nodes: Redemonstration of an enlarged right paratracheal lymph node measuring 12 mm in size, unchanged from prior several additional prominent paratracheal nodes are grossly similar when compared to prior study. Thyroid gland and thoracic inlet are unremarkable. Trachea is unremarkable. Included portions of the esophagus are free of abnormality. Lungs/Pleura: There is been interval increase in size of bilateral pleural effusions, now moderate to large in size which appear loculated but without abnormal pleural thickening or a ?split pleura" sign. Adjacent areas of passive atelectasis are noted. Some regions of parenchymal hypoattenuation are visualized within the atelectatic lung, most notably within the left lower lobe (6/83). Interlobular septal thickening is noted throughout the remaining aerated portions of the lungs. Upper Abdomen: Ascites is present in the upper abdomen only portion of the liver is visualized with much of the abdomen collimated from view. Musculoskeletal: Multilevel degenerative changes are present in the imaged portions of the spine. No acute osseous abnormality or suspicious osseous lesion. No suspicious chest wall abnormality. Review of the MIP images confirms the above findings. IMPRESSION: 1. Suboptimal exam given partial collimation lung bases. 2. No evidence of central or segmental pulmonary embolism. More distal evaluation is limited due to the extensive atelectatic changes throughout the lungs and respiratory motion artifact. 3. Interval increase in size of bilateral pleural effusions, now moderate to large in size which appear loculated but without abnormal pleural thickening. 4. Some regions of parenchymal hypoattenuation within the atelectatic lung, most notably within the left lower lobe, superimposed infection cannot be excluded. 5. Interlobular septal thickening throughout the remaining aerated portions of the lungs, consistent with interstitial  pulmonary edema. 6. Unchanged mediastinal lymphadenopathy. 7. Aortic Atherosclerosis (ICD10-I70.0). Electronically Signed   By: PLovena LeM.D.   On: 07/13/2019 20:55   Dg Chest Portable 1 View  Result Date: 07/19/2019 CLINICAL DATA:  Intubation EXAM: PORTABLE CHEST 1 VIEW COMPARISON:  07/14/2019 5:34 p.m. FINDINGS: Support Apparatus: --Endotracheal tube: Tip is just above the carina. Recommend retraction by 4 cm. --Enteric tube:Tip  and side-port in the stomach --Catheter(s):There is a right chest wall power-injectable Port-A-Cath with tip at the cavoatrial junction via a right internal jugular vein approach. --Other: None Bilateral pleural effusions with basilar atelectasis are unchanged. IMPRESSION: 1. Endotracheal tube tip at the carina, just above the right mainstem bronchus. Retraction by 4 cm is recommended. 2. NG tube tip and side-port in the stomach. 3. Unchanged pleural effusions. These results will be called to the ordering clinician or representative by the Radiologist Assistant, and communication documented in the PACS or zVision Dashboard. Electronically Signed   By: Ulyses Jarred M.D.   On: 07/09/2019 23:28   Dg Chest Port 1 View  Result Date: 07/11/2019 CLINICAL DATA:  Sepsis. EXAM: PORTABLE CHEST 1 VIEW COMPARISON:  06/11/2019 and 06/05/2019 FINDINGS: There has been a significant increase in the bilateral pleural effusions, now large with only minimal aeration of the upper lobes bilaterally. Power port in place, unchanged with the tip in the superior vena cava above the cavoatrial junction. Pulmonary vascularity appears normal. No acute bone abnormality. Old left clavicle and proximal right humerus fractures. IMPRESSION: Significant increase in the bilateral pleural effusions, now large. Electronically Signed   By: Lorriane Shire M.D.   On: 07/16/2019 17:52    Pending Labs Unresulted Labs (From admission, onward)    Start     Ordered   07/17/19 0500  Creatinine, serum  (enoxaparin  (LOVENOX)    CrCl >/= 30 ml/min)  Weekly,   R    Comments: while on enoxaparin therapy    07/26/2019 2322   07/25/2019 2322  Procalcitonin  Once,   STAT     07/01/2019 2322   07/06/2019 2321  CBC  (enoxaparin (LOVENOX)    CrCl >/= 30 ml/min)  Once,   STAT    Comments: Baseline for enoxaparin therapy IF NOT ALREADY DRAWN.  Notify MD if PLT < 100 K.    07/25/2019 2322   07/04/2019 2321  Creatinine, serum  (enoxaparin (LOVENOX)    CrCl >/= 30 ml/min)  Once,   STAT    Comments: Baseline for enoxaparin therapy IF NOT ALREADY DRAWN.    07/25/2019 2322   07/01/2019 1720  Culture, blood (Routine x 2)  BLOOD CULTURE X 2,   STAT     07/20/2019 1720          Vitals/Pain Today's Vitals   07/14/2019 2245 07/16/2019 2254 07/16/2019 2300 07/06/2019 2325  BP: (!) 125/93  (!) 124/112 (!) 81/61  Pulse: 75  (!) 105 87  Resp: _0 Temp:   (!) 95.9 F (35.5 C) (!) 97 F (36.1 C)  TempSrc:      SpO2: 100% 100% 92% 95%    Isolation Precautions No active isolations  Medications Medications  sodium chloride (PF) 0.9 % injection (has no administration in time range)  fentaNYL 25108mg in NS 2564m(1050mml) infusion-PREMIX (125 mcg/hr Intravenous New Bag/Given 07/02/2019 2305)  enoxaparin (LOVENOX) injection 40 mg (has no administration in time range)  pantoprazole (PROTONIX) injection 40 mg (has no administration in time range)  iohexol (OMNIPAQUE) 350 MG/ML injection 100 mL (100 mLs Intravenous Contrast Given 07/06/2019 2034)  etomidate (AMIDATE) injection (20 mg Intravenous Given 07/02/2019 2231)  succinylcholine (ANECTINE) injection (100 mg Intravenous Given 07/22/2019 2232)  fentaNYL (SUBLIMAZE) injection 50 mcg (50 mcg Intravenous Given 07/15/2019 2308)  LORazepam (ATIVAN) injection 2 mg (2 mg Intravenous Given 07/16/2019 2308)    Mobility walks Low fall risk   Focused Assessments Pulmonary Assessment Handoff:  Lung sounds: Bilateral Breath Sounds: Diminished, Fine crackles O2 Device: Ventilator         R Recommendations: See Admitting Provider Note  Report given to:  Ubaldo Glassing, 2W

## 2019-07-10 NOTE — ED Notes (Addendum)
Date and time results received: 07/16/2019 2114  Test: ABG Critical Value:pH 7.150 and CO2>120  Name of Provider Notified: Ria Comment EDPA Orders Received? Or Actions Taken?: see orders

## 2019-07-10 NOTE — ED Notes (Signed)
Gave report to Angelica on 2W

## 2019-07-10 NOTE — ED Provider Notes (Signed)
[facility] DEPT Provider Note   CSN: [id number] Arrival date & time: 07/09/2019  1658     History   Chief Complaint Chief Complaint  Patient presents with   Weakness    HPI [name] is a 73 y.o. male possible history of GI bleed (May 2018), esophageal cancer, CHF, bilateral pleural effusions who presents for evaluation via EMS for generalized weakness, altered mental status.  Per EMS, sister went to wake up patient this morning was having increased difficulty waking him up.  She states that he was more lethargic.  After several attempts of waking it up, he was awake but was altered and confused, prompting EMS call.  Patient is on home O2 at about 4 L.  Patient unable to tell me while he is here.   EM level 5 caveat due to altered mental status.     The history is provided by the patient.    Past Medical History:  Diagnosis Date   Acute upper GI bleed 02/2018   Transfused 4 U pRBCs-->EGD showed an esoph ulcer and some gastric ulcers (?radiation-induced), path showed no malignancy or Barrett's.  Rpt EGD 04/28/18: Grade I and small (< 5 mm) distal esophageal varices.  Esoph ulcer healed.  Mild portal hypert gastrop.  No sign of malignancy.   Asthma    as a child   CAD in native artery    LAD occluded since 2014->a. cardiac cath 09/2013 showed severely diseased mLAD and diagonal, mild LCx disease, moderate RCA disease, LVEDP 20, EF 50%. 11/2018 cath->progression of LAD dz, not amenable to PCI   Cataract    multiple types, bilateral   Cholelithiasis without cholecystitis    Chronic combined systolic and diastolic CHF (congestive heart failure) ([place]) 02/25/2009   EF 40-45% 2020; cath 11/2018 showed stable chronic occlusion of LAD w/collateral circ.  Since 2020 echo showed decrease in EF, low dose metop added w/plan to add ACE/ARB   Chronic renal insufficiency, stage III (moderate) ([place]) 2017   Stage II/III (GFR around 60)   Cirrhosis,  nonalcoholic ([place]) 2585   with splenomegaly.  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   Dermatitis 05/2014   DIABETES MELLITUS, TYPE II 06/24/2007   No diab retpthy as of 08/05/15 eye exam.   Esophageal cancer ([place]) 04/2016   poorly differentiated carcinoma (Dr. Pyrtle--EGD).  CT C/A/P showed metastatic adenopathy in mediastinum and upper abdomen 05/09/16.  Tx plan is palliative radiation (completed 06/22/16), then palliative systemic chemotherapy (carbo+taxol) was started but as of 08/03/16 onc f/u this was held due to severe knee and ankle arthralgias.  Restarting as of 10/2015 (08/2016 CT showed some disease regression   Esophageal cancer ([place])    CT 12/2016 showed no residual esoph mass--plan to continue chemo.  Ongoing palliative chemo with carboplatin and taxol q 2 wks as of 03/2017 onc f/u (CT 12/2016 and 04/2017 showed no progression of dz).  Taking a 2 mo break from chemo as of 05/2017.  Pleural effusion-- improved with lasix but onc wanted him to get dx/therap thoracentesis-pt declined.  CT C/A/P  no progression/recurr/mets 11/2017.   Esophageal cancer ([place])    11/2018 surveillance CTs showed no evidence of dz recurrence.   Essential hypertension 05/01/2007   Qualifier: Diagnosis of  By: Tiney Rouge CMA, Ell[place] Hughs      Herpes zoster 12/2018   Valtrex   History of kidney stones    Hyperkalemia 12/2015   Decreased ACE-I by 50% in response, then potassium normalized.   HYPERLIPIDEMIA 06/24/2007   03/2018 lipids at goal   Morbid obesity ([place])    Obesity hypoventilation syndrome (HCC)    oxygen 24/7 (2 liters  [place] as of 02/2016)   On home oxygen therapy    Oxygen @ 2l/m nasally 24/7 hours   OSA (obstructive sleep apnea)    not tested; pt scored 4 per stop bang tool results sent to PCP    OSTEOARTHRITIS 05/01/2007   Pancytopenia due to chemotherapy ([place]) 2018   Pleural effusion on right 08/2017   ? malignant vs CHF: pt refuses diagnostic thoracentesis b/c he states he feels fine, wants to try diuretic first.   Pleural effusion on right 09/2017; 2019/2020   Thoracentesis.  Most recent thoracentesis 12/2018->no malignancy.   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   RBBB    Visual field defect 05/10/2016   Per Dr. Melina Fiddler, O.D.: Rt, loss of inf/temp quad and some loss sup/temp.  ?CVA  ? Pituitary tumor? 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   Mass of esophagus determined by endoscopy    O2 dependent 04/10/2016   IDDM (insulin dependent diabetes mellitus) ([place]) 04/10/2016   Dysphagia 03/22/2016   PND (paroxysmal nocturnal dyspnea)    Dyslipidemia associated with type 2 diabetes mellitus ([place])    DM (diabetes mellitus) type II, controlled, with peripheral vascular disorder ([place])    Spasm of muscle, back 06/20/2015   Right knee pain 10/26/2014   Scabies  09/10/2014   Impaired functional mobility and activity tolerance 08/26/2014   Recurrent falls 08/26/2014   Other emphysema ([place]) 08/26/2014   COPD (chronic obstructive pulmonary disease) (HCC)    Rash and nonspecific skin eruption 06/27/2014   Pruritic condition 05/18/2014   Adjustment disorder with anxious mood 04/25/2014   Itchy skin 04/25/2014   OSA (obstructive sleep apnea) 02/24/2014   Coronary atherosclerosis of native coronary artery 11/10/2013   Abnormal cardiovascular stress test 10/16/2013   Dyspnea on exertion 09/14/2013   Hypoxemia 01/27/2010   Chronic diastolic CHF (congestive heart failure) ([place]) 02/25/2009   Morbid obesity ([place]) 09/02/2008   Diabetes mellitus without complication ([place]) 94/44/6190   Dyslipidemia 06/24/2007   Essential hypertension 05/01/2007   OSTEOARTHRITIS 05/01/2007    Past Surgical History:  Procedure Laterality Date   APPENDECTOMY     BALLOON DILATION N/A 05/04/2016   Procedure: BALLOON DILATION;  Surgeon: Jerene Bears, MD;  Location: [facility];  Service: Gastroenterology;  Laterality: N/A;   BIOPSY  03/19/2018   Procedure: BIOPSY;  Surgeon: Jackquline Denmark, MD;  Location: [facility];  Service: Endoscopy;;   CARDIAC CATHETERIZATION     CARPAL TUNNEL RELEASE Left    CATARACT EXTRACTION W/PHACO Right 04/29/2013   Procedure: CATARACT EXTRACTION PHACO AND INTRAOCULAR LENS PLACEMENT ([place]);  Surgeon: Adonis Brook, MD;  Location: [facility];  Service: Ophthalmology;  Laterality: Right;   CATARACT EXTRACTION, BILATERAL     COLONOSCOPY W/ POLYPECTOMY  08/2011   Many polyps--all hyperplastic, severe diverticulosis, int hem.  [employer] hemoccult testing via [facility] 06/22/15 NEG   ESOPHAGOGASTRODUODENOSCOPY N/A 05/04/2016   Procedure: ESOPHAGOGASTRODUODENOSCOPY (EGD);  Surgeon: Jerene Bears, MD;  Location: [facility];  Service: Gastroenterology;  Laterality: N/A;   ESOPHAGOGASTRODUODENOSCOPY (EGD) WITH PROPOFOL N/A 03/19/2018   Gastric  ulcers, esoph ulcer (radiation-induced?), bx-->path neg for malignancy or Barrett's.  GI plans repeat EGD 6-8 wks after 1st.   Procedure: ESOPHAGOGASTRODUODENOSCOPY (EGD) WITH PROPOFOL;  Surgeon: Jackquline Denmark, MD;  Location: [facility];  Service: Endoscopy;  Laterality: N/A;   ESOPHAGOGASTRODUODENOSCOPY (EGD) WITH PROPOFOL N/A 04/28/2018   Esoph ulcer healed.  Grade I and small (< 5 mm) distal esophageal varices.  Mild portal hypertensive gastropathy.  No evidence of malignance.  Procedure: ESOPHAGOGASTRODUODENOSCOPY (EGD) WITH PROPOFOL;  Surgeon: Jerene Bears, MD;  Location: [facility];  Service: Gastroenterology;  Laterality: N/A;   IR GENERIC HISTORICAL  10/03/2016   IR US GUIDE VASC ACCESS RIGHT 10/03/2016 Greggory Keen, MD [facility]   IR GENERIC HISTORICAL  10/03/2016   IR FLUORO GUIDE PORT INSERTION RIGHT 10/03/2016 Greggory Keen, MD [facility]   KNEE ARTHROSCOPY WITH MEDIAL MENISECTOMY Right 12/16/2014   Procedure: RIGHT KNEE ARTHROSCOPY WITH MEDIAL MENISECTOMY microfracture medial femoral condyle abrasion condroplasty medial femoral condyle lateral menisectomy;  Surgeon: Tobi Bastos, MD;  Location: [facility];  Service: Orthopedics;  Laterality: Right;   LEFT HEART CATHETERIZATION WITH CORONARY ANGIOGRAM N/A 10/26/2013   Procedure: LEFT HEART CATHETERIZATION WITH CORONARY ANGIOGRAM;  Surgeon: Jettie Booze, MD;  Location: [facility] C[facility] LAB;  Service: Cardiovascular;  Laterality: N/A;   LUMBAR DISC SURGERY  2001   RIGHT/LEFT HEART CATH AND CORONARY ANGIOGRAPHY N/A 12/17/2018   Progression of LAD dz, not amenable to PCI. R heart cath with normal pressures.  Procedure: RIGHT/LEFT HEART CATH AND CORONARY ANGIOGRAPHY;  Surgeon: Leonie Man, MD;  Location: [facility];  Service: Cardiovascular;  Laterality: N/A;   SHOULDER SURGERY Right    right fx   THORACENTESIS  12/2018   Right->no malignancy   TONSILLECTOMY     TRANSTHORACIC ECHOCARDIOGRAM  03/2016; 11/25/2018    2017: EF 55-60%, grade I DD, LAE.  Jan 2020->new anteroapical akinesis, EF 40-45%-->cath 11/2018 showed no acute changes (progression of LAD dz->this explains the [facility] seen on recent echo.   WRIST SURGERY Right    right fx        Home Medications    Prior to Admission medications   Medication Sig Start Date End Date Taking? Authorizing Provider  atorvastatin (LIPITOR) 80 MG tablet Take 1 tablet by mouth once daily 04/20/19  Yes Turner, Traci R, MD  baclofen (LIORESAL) 10 MG tablet TAKE 1/2 TO 1 (ONE-HALF TO ONE) TABLET BY MOUTH EVERY 8 HOURS AS NEEDED FOR MUSCLE SPASM Patient taking differently: Take 5-10 mg by mouth every 8 (eight) hours as needed for muscle spasms.  03/17/19  Yes McGowen, Adrian Blackwater,  MD  beta carotene w/minerals (OCUVITE) tablet Take 1 tablet by mouth daily.   Yes [provider]  cetirizine (ZYRTEC) 10 MG tablet Take 10 mg by mouth every evening.    Yes [provider]  citalopram (CELEXA) 20 MG tablet Take 1 tablet by mouth once daily 01/26/19  Yes McGowen, Adrian Blackwater, MD  clonazePAM (KLONOPIN) 1 MG tablet Take 1 tablet by mouth twice daily as needed for anxiety 01/26/19  Yes McGowen, Adrian Blackwater, MD  clotrimazole-betamethasone (LOTRISONE) cream APPLY  CREAM TOPICALLY TO AFFECTED AREA TWICE DAILY Patient taking differently: Apply 1 application topically 2 (two) times daily.  12/05/18  Yes McGowen, Adrian Blackwater, MD  ezetimibe (ZETIA) 10 MG tablet Take 1 tablet by mouth once daily 04/06/19  Yes Turner, Eber Hong, MD  furosemide (LASIX) 40 MG tablet Take 1 tablet every other day alternating with a 1/2 tablet every other day. Patient taking differently: Take 20-40 mg by mouth See admin instructions. Alternate between 54m and 40 mg every other day 06/05/19  Yes Turner, Traci R, MD  gabapentin (NEURONTIN) 300 MG capsule TAKE 1 CAPSULE BY MOUTH THREE TIMES DAILY Patient taking differently: Take 300 mg by mouth 3 (three) times daily.  12/02/18  Yes McGowen, PAdrian Blackwater MD  HUMULIN N  KWIKPEN 100 UNIT/ML Kiwkpen INJECT 16 UNITS SUBCUTANEOUSLY IN THE MORNING AND 16 IN THE EVENING Patient taking differently: Inject 16 Units into the skin 2 (two) times daily.  05/14/19  Yes McGowen, PAdrian Blackwater MD  hydrOXYzine (ATARAX/VISTARIL) 25 MG tablet TAKE ONE TABLET BY MOUTH AT SUPPER AND 1 TABLET AT BEDTIME FOR INSOMNIA Patient taking differently: Take 25 mg by mouth 2 (two) times daily. At Supper and bedtime 10/06/18  Yes McGowen, PAdrian Blackwater MD  Insulin Lispro (HUMALOG KWIKPEN) 200 UNIT/ML SOPN Inject 20 Units into the skin 2 (two) times daily. 05/09/18  Yes McGowen, PAdrian Blackwater MD  magnesium chloride (SLOW-MAG) 64 MG TBEC SR tablet Take 1 tablet (64 mg total) by mouth daily. 09/01/16  Yes KEber Jones MD  metFORMIN (GLUCOPHAGE) 1000 MG tablet TAKE 1 TABLET BY MOUTH TWICE DAILY WITH MEALS Patient taking differently: Take 1,000 mg by mouth 2 (two) times daily with a meal.  06/29/19  Yes McGowen, PAdrian Blackwater MD  metoprolol tartrate (LOPRESSOR) 25 MG tablet Take 1/2 (one-half) tablet by mouth twice daily Patient taking differently: Take 12.5 mg by mouth 2 (two) times daily.  06/15/19  Yes Bhagat, Bhavinkumar, PA  mirtazapine (REMERON) 7.5 MG tablet Take 1 tablet (7.5 mg total) by mouth at bedtime. 06/19/19  Yes FTruitt Merle MD  Omega-3 Fatty Acids (FISH OIL) 1200 MG CAPS Take 2,400 mg by mouth 2 (two) times daily.   Yes [provider]  pantoprazole (PROTONIX) 40 MG tablet TAKE 1 TABLET BY MOUTH TWICE DAILY Patient taking differently: Take 40 mg by mouth 2 (two) times daily.  09/30/18  Yes McGowen, PAdrian Blackwater MD  acetaminophen (TYLENOL) 500 MG tablet Take 1,000 mg by mouth every 6 (six) hours as needed for moderate pain.    [provider]  OJonetta SpeakLANCETS [id number] USE   TO CHECK GLUCOSE THREE TIMES DAILY 06/24/18   McGowen, PAdrian Blackwater MD  [facility] test strip USE 1 STRIP TO CHECK GLUCOSE THREE TIMES DAILY AS DIRECTED 06/22/19   McGowen, PAdrian Blackwater MD  OXYGEN Inhale 2 L into  the lungs continuous.     [provider]    Family History Family History  Problem Relation Age  of Onset   Heart attack Mother    Aneurysm Father    Alcohol abuse Father    Diabetes Maternal Aunt    Colon cancer Neg Hx     Social History Social History   Tobacco Use   Smoking status: Former Smoker    Packs/day: 1.50    Years: 30.00    Pack years: 45.00    Types: Cigarettes, Pipe, Cigars    Quit date: 10/30/1983    Years since quitting: 35.7   Smokeless tobacco: Current User    Types: Chew    Last attempt to quit: 05/15/2016  Substance Use Topics   Alcohol use: No   Drug use: No     Allergies   Spironolactone   Review of Systems Review of Systems  Unable to perform ROS: Mental status change     Physical Exam Updated Vital Signs BP (!) 71/57 (BP Location: Left Arm)    Pulse 87    Temp (!) 97.3 F (36.3 C) (Bladder)    Resp 16    Ht '5\' 8"'  (1.727 m)    SpO2 100%    BMI 39.53 kg/m   Physical Exam Vitals signs and nursing note reviewed.  Constitutional:      Appearance: Normal appearance. He is well-developed.     Comments: Drowsy but easily aroused with verbal stimuli.  HENT:     Head: Normocephalic and atraumatic.  Eyes:     General: Lids are normal.     Conjunctiva/sclera: Conjunctivae normal.     Pupils: Pupils are equal, round, and reactive to light.  Neck:     Musculoskeletal: Full passive range of motion without pain.  Cardiovascular:     Rate and Rhythm: Normal rate and regular rhythm.     Pulses: Normal pulses.          Radial pulses are 2+ on the right side and 2+ on the left side.       Dorsalis pedis pulses are 2+ on the right side and 2+ on the left side.     Heart sounds: Normal heart sounds. No murmur. No friction rub. No gallop.   Pulmonary:     Breath sounds: Normal breath sounds.     Comments: Increased work of breathing noted.  Crackles noted to bilateral lung fields that go up to about the mid lung field area.  No  wheezing noted. Abdominal:     Palpations: Abdomen is soft. Abdomen is not rigid.     Tenderness: There is no abdominal tenderness. There is no guarding.     Comments: Abdomen is soft, non-distended, non-tender. No rigidity, No guarding. No peritoneal signs.  Musculoskeletal: Normal range of motion.     Comments: 1+ pitting edema noted to bilateral lower extremities that extends from the mid tib-fib region distally.  No overlying warmth, erythema.  Skin:    General: Skin is warm and dry.     Capillary Refill: Capillary refill takes less than 2 seconds.  Neurological:     Comments: Alert and oriented x1.  He is able to tell me his name but otherwise does not know where he is or what year it is. Intermittently follows commands.  5 out of 5 strength bilateral upper and lower extremities. Difficulty assessing cranial nerves secondary to patient's ability to follow commands.  Psychiatric:        Speech: Speech normal.      ED Treatments / Results  Labs (all labs ordered are listed, but only abnormal  results are displayed) Labs Reviewed  URINALYSIS, ROUTINE W REFLEX MICROSCOPIC - Abnormal; Notable for the following components:      Result Value   APPearance HAZY (*)    Specific Gravity, Urine 1.040 (*)    Leukocytes,Ua SMALL (*)    Bacteria, UA RARE (*)    All other components within normal limits  COMPREHENSIVE METABOLIC PANEL - Abnormal; Notable for the following components:   Potassium 5.3 (*)    Chloride 85 (*)    CO2 41 (*)    Glucose, Bld 183 (*)    Creatinine, Ser 1.31 (*)    Calcium 8.3 (*)    Albumin 2.8 (*)    GFR calc non Af Amer 53 (*)    All other components within normal limits  LACTIC ACID, PLASMA - Abnormal; Notable for the following components:   Lactic Acid, Venous 2.1 (*)    All other components within normal limits  CBC WITH DIFFERENTIAL/PLATELET - Abnormal; Notable for the following components:   RBC 3.73 (*)    Hemoglobin 10.1 (*)    HCT 38.2 (*)    MCV  102.4 (*)    MCHC 26.4 (*)    RDW 19.4 (*)    nRBC 0.6 (*)    Lymphs Abs 0.6 (*)    All other components within normal limits  BLOOD GAS, VENOUS - Abnormal; Notable for the following components:   pH, Ven 7.149 (*)    All other components within normal limits  BLOOD GAS, ARTERIAL - Abnormal; Notable for the following components:   pH, Arterial 7.150 (*)    pO2, Arterial 73.3 (*)    All other components within normal limits  CBC - Abnormal; Notable for the following components:   RBC 3.29 (*)    Hemoglobin 9.0 (*)    HCT 33.2 (*)    MCV 100.9 (*)    MCHC 27.1 (*)    RDW 19.4 (*)    nRBC 0.3 (*)    All other components within normal limits  CBG MONITORING, ED - Abnormal; Notable for the following components:   Glucose-Capillary 170 (*)    All other components within normal limits  SARS CORONAVIRUS 2 (HOSPITAL ORDER, [facility])  CULTURE, BLOOD (ROUTINE X 2)  CULTURE, BLOOD (ROUTINE X 2)  MRSA PCR SCREENING  LACTIC ACID, PLASMA  PROTIME-INR  BRAIN NATRIURETIC PEPTIDE  CREATININE, SERUM  PROCALCITONIN  BLOOD GAS, ARTERIAL  SAMPLE TO BLOOD BANK    EKG EKG Interpretation  Date/Time:  Friday July 10 2019 17:33:35 EDT Ventricular Rate:  79 PR Interval:    QRS Duration: 148 QT Interval:  419 QTC Calculation: 481 R Axis:   -35 Text Interpretation:  Atrial fibrillation Right bundle branch block Inferior infarct, old Artifact Confirmed by Fredia Sorrow [id number]) on 07/26/2019 5:47:55 PM   Radiology Ct Head Wo Contrast  Result Date: 07/24/2019 CLINICAL DATA:  73 year old male with altered mental status. History of esophageal malignancy. EXAM: CT HEAD WITHOUT CONTRAST TECHNIQUE: Contiguous axial images were obtained from the base of the skull through the vertex without intravenous contrast. COMPARISON:  None. FINDINGS: Brain: There is an area of encephalomalacia in the left parietal lobe. The ventricles and sulci are otherwise appropriate size  for patient's age. Mild periventricular and deep white matter chronic microvascular ischemic changes noted. There is no acute intracranial hemorrhage. No mass effect or midline shift. No extra-axial fluid collection. Vascular: No hyperdense vessel or unexpected calcification. Skull: Focal area of thinning and  lucency in the left parietal calvarium may represent postsurgical changes of craniectomy or other radiation changes. An infiltrative lesion is less likely. Correlation with history of prior surgery and direct comparison with prior CTs, if available, recommended. No acute calvarial pathology. Sinuses/Orbits: No acute finding. Other: None IMPRESSION: 1. No acute intracranial hemorrhage. 2. Mild chronic microvascular ischemic changes. Focal area of encephalomalacia in the left parietal lobe. 3. Focal area of thinning and lucency in the left parietal calvarium likely related to prior craniectomy or radiation changes. An infiltrative lesion is less likely. Correlation with history of prior surgery and direct comparison with prior CTs, if available, recommended. Electronically Signed   By: Anner Crete M.D.   On: 07/29/2019 20:53   Ct Angio Chest Pe W And/or Wo Contrast  Result Date: 07/03/2019 CLINICAL DATA:  Shortness of breath, history of GE junction adenocarcinoma EXAM: CT ANGIOGRAPHY CHEST WITH CONTRAST TECHNIQUE: Multidetector CT imaging of the chest was performed using the standard protocol during bolus administration of intravenous contrast. Multiplanar CT image reconstructions and MIPs were obtained to evaluate the vascular anatomy. CONTRAST:  121m OMNIPAQUE IOHEXOL 350 MG/ML SOLN COMPARISON:  CT 12/10/2018 FINDINGS: Cardiovascular: Satisfactory opacification of the pulmonary arteries to the segmental level. More distal evaluation is limited due to the extensive atelectatic changes throughout the lungs and respiratory motion artifact. No central or segmental pulmonary embolism is identified. Central  pulmonary artery is borderline enlarged. Atherosclerotic plaque within the normal caliber aorta. Normal 3 vessel branching aortic arch major venous structures are unremarkable aside from a tunneled right IJ approach Port-A-Cath with tip positioned near the superior cavoatrial junction. Normal heart size. No pericardial effusion. Atherosclerotic calcification of the coronary arteries. Calcifications are present upon the aortic leaflets and within the mitral annulus. Mediastinum/Nodes: Redemonstration of an enlarged right paratracheal lymph node measuring 12 mm in size, unchanged from prior several additional prominent paratracheal nodes are grossly similar when compared to prior study. Thyroid gland and thoracic inlet are unremarkable. Trachea is unremarkable. Included portions of the esophagus are free of abnormality. Lungs/Pleura: There is been interval increase in size of bilateral pleural effusions, now moderate to large in size which appear loculated but without abnormal pleural thickening or a ?split pleura" sign. Adjacent areas of passive atelectasis are noted. Some regions of parenchymal hypoattenuation are visualized within the atelectatic lung, most notably within the left lower lobe (6/83). Interlobular septal thickening is noted throughout the remaining aerated portions of the lungs. Upper Abdomen: Ascites is present in the upper abdomen only portion of the liver is visualized with much of the abdomen collimated from view. Musculoskeletal: Multilevel degenerative changes are present in the imaged portions of the spine. No acute osseous abnormality or suspicious osseous lesion. No suspicious chest wall abnormality. Review of the MIP images confirms the above findings. IMPRESSION: 1. Suboptimal exam given partial collimation lung bases. 2. No evidence of central or segmental pulmonary embolism. More distal evaluation is limited due to the extensive atelectatic changes throughout the lungs and respiratory  motion artifact. 3. Interval increase in size of bilateral pleural effusions, now moderate to large in size which appear loculated but without abnormal pleural thickening. 4. Some regions of parenchymal hypoattenuation within the atelectatic lung, most notably within the left lower lobe, superimposed infection cannot be excluded. 5. Interlobular septal thickening throughout the remaining aerated portions of the lungs, consistent with interstitial pulmonary edema. 6. Unchanged mediastinal lymphadenopathy. 7. Aortic Atherosclerosis (ICD10-I70.0). Electronically Signed   By: PLovena LeM.D.   On: 07/25/2019 20:55  Dg Chest Portable 1 View  Result Date: 07/29/2019 CLINICAL DATA:  Intubation EXAM: PORTABLE CHEST 1 VIEW COMPARISON:  07/08/2019 5:34 p.m. FINDINGS: Support Apparatus: --Endotracheal tube: Tip is just above the carina. Recommend retraction by 4 cm. --Enteric tube:Tip and side-port in the stomach --Catheter(s):There is a right chest wall power-injectable Port-A-Cath with tip at the cavoatrial junction via a right internal jugular vein approach. --Other: None Bilateral pleural effusions with basilar atelectasis are unchanged. IMPRESSION: 1. Endotracheal tube tip at the carina, just above the right mainstem bronchus. Retraction by 4 cm is recommended. 2. NG tube tip and side-port in the stomach. 3. Unchanged pleural effusions. These results will be called to the ordering clinician or representative by the Radiologist Assistant, and communication documented in the PACS or zVision Dashboard. Electronically Signed   By: Ulyses Jarred M.D.   On: 06/30/2019 23:28   Dg Chest Port 1 View  Result Date: 07/04/2019 CLINICAL DATA:  Sepsis. EXAM: PORTABLE CHEST 1 VIEW COMPARISON:  06/11/2019 and 06/05/2019 FINDINGS: There has been a significant increase in the bilateral pleural effusions, now large with only minimal aeration of the upper lobes bilaterally. Power port in place, unchanged with the tip in the  superior vena cava above the cavoatrial junction. Pulmonary vascularity appears normal. No acute bone abnormality. Old left clavicle and proximal right humerus fractures. IMPRESSION: Significant increase in the bilateral pleural effusions, now large. Electronically Signed   By: Lorriane Shire M.D.   On: 07/13/2019 17:52    Procedures Procedure Name: Intubation Date/Time: 07/11/2019 10:42 PM Performed by: Volanda Napoleon, PA-C Pre-anesthesia Checklist: Patient identified, Emergency Drugs available, Suction available and Patient being monitored Oxygen Delivery Method: Ambu bag Induction Type: Rapid sequence Ventilation: Mask ventilation without difficulty Laryngoscope Size: Glidescope Tube size: 7.5 mm Number of attempts: 1 Airway Equipment and Method: Stylet Placement Confirmation: ETT inserted through vocal cords under direct vision,  Positive ETCO2 and Breath sounds checked- equal and bilateral Secured at: 26 cm Tube secured with: ETT holder Dental Injury: Teeth and Oropharynx as per pre-operative assessment     .Critical Care Performed by: Volanda Napoleon, PA-C Authorized by: Volanda Napoleon, PA-C   Critical care provider statement:    Critical care time (minutes):  45   Critical care was necessary to treat or prevent imminent or life-threatening deterioration of the following conditions:  Respiratory failure   Critical care was time spent personally by me on the following activities:  Discussions with consultants, evaluation of patient's response to treatment, examination of patient, ordering and performing treatments and interventions, ordering and review of laboratory studies, ordering and review of radiographic studies, pulse oximetry, re-evaluation of patient's condition, obtaining history from patient or surrogate and review of old charts   I assumed direction of critical care for this patient from another provider in my specialty: yes     (including critical care  time)  Medications Ordered in ED Medications  sodium chloride (PF) 0.9 % injection (has no administration in time range)  fentaNYL 2577mg in NS 2564m(1055mml) infusion-PREMIX (125 mcg/hr Intravenous New Bag/Given 07/03/2019 2305)  enoxaparin (LOVENOX) injection 40 mg (has no administration in time range)  pantoprazole (PROTONIX) injection 40 mg (has no administration in time range)  Chlorhexidine Gluconate Cloth 2 % PADS 6 each (has no administration in time range)  chlorhexidine gluconate (MEDLINE KIT) (PERIDEX) 0.12 % solution 15 mL (has no administration in time range)  MEDLINE mouth rinse (has no administration in time range)  sodium chloride flush (NS)  0.9 % injection 10-40 mL (has no administration in time range)  sodium chloride flush (NS) 0.9 % injection 10-40 mL (has no administration in time range)  iohexol (OMNIPAQUE) 350 MG/ML injection 100 mL (100 mLs Intravenous Contrast Given 07/06/2019 2034)  etomidate (AMIDATE) injection (20 mg Intravenous Given 07/01/2019 2231)  succinylcholine (ANECTINE) injection (100 mg Intravenous Given 07/20/2019 2232)  fentaNYL (SUBLIMAZE) injection 50 mcg (50 mcg Intravenous Given 07/23/2019 2308)  LORazepam (ATIVAN) injection 2 mg (2 mg Intravenous Given 07/25/2019 2308)     Initial Impression / Assessment and Plan / ED Course  I have reviewed the triage vital signs and the nursing notes.  Pertinent labs & imaging results that were available during my care of the patient were reviewed by me and considered in my medical decision making (see chart for details).        73 year old male who presents for evaluation of altered mental status, increased lethargy.  History esophageal cancer, CHF, pleural effusions.  Sister endorses difficulty arousing him and him being more altered today.  On initial ED arrival, he was ranging from sats between 78% - 82% on 4 L O2.  4 L O2 is his baseline at home.  Vitals otherwise stable.  On my exam, he did appear slightly drowsy  but was easily aroused with verbal stimuli.  He has noted rales to mid lung fields.  No obvious wheezing.  Question infectious etiology versus hypercapnia state versus PE given history of cancer versus pleural effusion.  Labs, imaging ordered.    Chest x-ray shows bilateral pleural effusions that are significantly larger than most recent studies.  He had previous chest x-ray and CT scans done a few weeks ago that showed that they had been increasing.  Patient continuously dropping down between 72-74% on 4 L.  He was transitioned to nonrebreather which bumped him up into the low 80s.  Given slow progression and improvement, he was transitioned to close circuit BiPAP.  His VBG showed a pH of 7.1.  His PCO2 was critical and was unable to be measured.  COVID test was negative.  Initial lactate was elevated at 2.1.  CMP potassium of 5.3.  BUN and creatinine were 21/1.31.  CBC showed white blood cells of 7.2.  Hemoglobin stable at 10.1.  CBG of 170.  CTA of chest showed no evidence of PE.  He did have increase in size of bilateral pleural effusions which appear loculated but without abnormal pleural thickening.  CT head negative for any acute intracranial hemorrhage.  There was mild chronic microvascular ischemic changes.  No other acute abnormalities.  Discussed with Dr. Jonelle Sidle (hospitalist).  Will plan to admit patient.   Dr. Mcarthur Rossetti went to evaluate patient and patient was more lethargic.  I went and evaluated patient and he was de-satting down into the low 90s/high 80s.  I attempted to sternal rub him with no response.  Given worsening decompensation, discussed with ED attending and plan was for intubation.  Review of his records show that he had had an oncology note in August 2020 that mentioned that he was DNI/DNR but did note that he was open to reversal if needed.  I discussed with his daughter, Nolberto Hanlon, who states that this was the conversation that he had had with oncology.  We discussed at  length regarding patient's expressed wishes.  She believes that patient will want to be intubated at this time.  I discussed with her risk versus benefits of intubation and she expressed understanding and  wishes to proceed with intubation.  Patient has document above.  Patient tolerated procedure well.  Discussed with Dr. James Ivanoff [facility]) will plan to admit.  Portions of this note were generated with Lobbyist. Dictation errors may occur despite best attempts at proofreading.   Final Clinical Impressions(s) / ED Diagnoses   Final diagnoses:  Pleural effusion  Hypercapnia  Hypoxia  Altered mental status, unspecified altered mental status type    ED Discharge Orders    None       Volanda Napoleon, PA-C 07/11/19 0032    Fredia Sorrow, MD 07/25/19 [phone]

## 2019-07-11 ENCOUNTER — Inpatient Hospital Stay (HOSPITAL_COMMUNITY): Payer: Medicare Other

## 2019-07-11 DIAGNOSIS — Z515 Encounter for palliative care: Secondary | ICD-10-CM

## 2019-07-11 DIAGNOSIS — A419 Sepsis, unspecified organism: Secondary | ICD-10-CM

## 2019-07-11 DIAGNOSIS — R4182 Altered mental status, unspecified: Secondary | ICD-10-CM

## 2019-07-11 DIAGNOSIS — Z7189 Other specified counseling: Secondary | ICD-10-CM

## 2019-07-11 DIAGNOSIS — C159 Malignant neoplasm of esophagus, unspecified: Secondary | ICD-10-CM

## 2019-07-11 DIAGNOSIS — G9340 Encephalopathy, unspecified: Secondary | ICD-10-CM

## 2019-07-11 LAB — BLOOD GAS, ARTERIAL
Acid-Base Excess: 13.9 mmol/L — ABNORMAL HIGH (ref 0.0–2.0)
Bicarbonate: 42 mmol/L — ABNORMAL HIGH (ref 20.0–28.0)
Drawn by: 11249
FIO2: 100
MECHVT: 550 mL
O2 Saturation: 98.9 %
PEEP: 5 cmH2O
Patient temperature: 97.3
RATE: 20 resp/min
pCO2 arterial: 75.7 mmHg (ref 32.0–48.0)
pH, Arterial: 7.359 (ref 7.350–7.450)
pO2, Arterial: 135 mmHg — ABNORMAL HIGH (ref 83.0–108.0)

## 2019-07-11 LAB — BASIC METABOLIC PANEL
Anion gap: 11 (ref 5–15)
Anion gap: 15 (ref 5–15)
BUN: 23 mg/dL (ref 8–23)
BUN: 27 mg/dL — ABNORMAL HIGH (ref 8–23)
CO2: 38 mmol/L — ABNORMAL HIGH (ref 22–32)
CO2: 33 mmol/L — ABNORMAL HIGH (ref 22–32)
Calcium: 8 mg/dL — ABNORMAL LOW (ref 8.9–10.3)
Calcium: 7.9 mg/dL — ABNORMAL LOW (ref 8.9–10.3)
Chloride: 90 mmol/L — ABNORMAL LOW (ref 98–111)
Chloride: 90 mmol/L — ABNORMAL LOW (ref 98–111)
Creatinine, Ser: 1.33 mg/dL — ABNORMAL HIGH (ref 0.61–1.24)
Creatinine, Ser: 1.26 mg/dL — ABNORMAL HIGH (ref 0.61–1.24)
GFR calc Af Amer: >60 mL/min (ref >60)
GFR calc Af Amer: >60 mL/min (ref >60)
GFR calc non Af Amer: 52 mL/min — ABNORMAL LOW (ref >60)
GFR calc non Af Amer: 56 mL/min — ABNORMAL LOW (ref >60)
Glucose, Bld: 103 mg/dL — ABNORMAL HIGH (ref 70–99)
Glucose, Bld: 104 mg/dL — ABNORMAL HIGH (ref 70–99)
Potassium: 5.3 mmol/L — ABNORMAL HIGH (ref 3.5–5.1)
Potassium: 5.2 mmol/L — ABNORMAL HIGH (ref 3.5–5.1)
Sodium: 139 mmol/L (ref 135–145)
Sodium: 138 mmol/L (ref 135–145)

## 2019-07-11 LAB — RESPIRATORY PANEL BY PCR

## 2019-07-11 LAB — BODY FLUID CELL COUNT WITH DIFFERENTIAL
Eos, Fluid: 1 %
Lymphs, Fluid: 34 %
Monocyte-Macrophage-Serous Fluid: 48 % — ABNORMAL LOW (ref 50–90)
Neutrophil Count, Fluid: 17 % (ref 0–25)
Total Nucleated Cell Count, Fluid: 617 cu mm (ref 0–1000)

## 2019-07-11 LAB — GLUCOSE, CAPILLARY
Glucose-Capillary: 76 mg/dL (ref 70–99)
Glucose-Capillary: 120 mg/dL — ABNORMAL HIGH (ref 70–99)
Glucose-Capillary: 114 mg/dL — ABNORMAL HIGH (ref 70–99)
Glucose-Capillary: 111 mg/dL — ABNORMAL HIGH (ref 70–99)
Glucose-Capillary: 119 mg/dL — ABNORMAL HIGH (ref 70–99)

## 2019-07-11 LAB — CBC
HCT: 29.5 % — ABNORMAL LOW (ref 39.0–52.0)
Hemoglobin: 8.4 g/dL — ABNORMAL LOW (ref 13.0–17.0)
MCH: 27.1 pg (ref 26.0–34.0)
MCHC: 28.5 g/dL — ABNORMAL LOW (ref 30.0–36.0)
MCV: 95.2 fL (ref 80.0–100.0)
Platelets: 165 10*3/uL (ref 150–400)
RBC: 3.1 MIL/uL — ABNORMAL LOW (ref 4.22–5.81)
RDW: 19.2 % — ABNORMAL HIGH (ref 11.5–15.5)
WBC: 6.7 10*3/uL (ref 4.0–10.5)
nRBC: 0 % (ref 0.0–0.2)

## 2019-07-11 LAB — GLUCOSE, PLEURAL OR PERITONEAL FLUID: Glucose, Fluid: 88 mg/dL

## 2019-07-11 LAB — LACTATE DEHYDROGENASE: LDH: 242 U/L — ABNORMAL HIGH (ref 98–192)

## 2019-07-11 LAB — MRSA PCR SCREENING: MRSA by PCR: NEGATIVE

## 2019-07-11 LAB — CORTISOL: Cortisol, Plasma: 12.4 ug/dL

## 2019-07-11 LAB — LACTATE DEHYDROGENASE, PLEURAL OR PERITONEAL FLUID: LD, Fluid: 251 U/L — ABNORMAL HIGH (ref 3–23)

## 2019-07-11 LAB — PROTEIN, PLEURAL OR PERITONEAL FLUID: Total protein, fluid: 4.3 g/dL

## 2019-07-11 LAB — PROTEIN, TOTAL: Total Protein: 4.4 g/dL — ABNORMAL LOW (ref 6.5–8.1)

## 2019-07-11 LAB — PROCALCITONIN: Procalcitonin: 0.26 ng/mL

## 2019-07-11 LAB — LACTIC ACID, PLASMA: Lactic Acid, Venous: 1.1 mmol/L (ref 0.5–1.9)

## 2019-07-11 LAB — AMMONIA: Ammonia: 23 umol/L (ref 9–35)

## 2019-07-11 MED ORDER — MIDODRINE HCL 5 MG PO TABS
10.0000 mg | ORAL_TABLET | Freq: Three times a day (TID) | ORAL | Status: DC
  Administered 2019-07-11 – 2019-07-12 (×6): 10 mg via ORAL
  Filled 2019-07-11 (×6): qty 2

## 2019-07-11 MED ORDER — INSULIN ASPART 100 UNIT/ML ~~LOC~~ SOLN
3.0000 [IU] | SUBCUTANEOUS | Status: DC
  Administered 2019-07-12 (×2): 3 [IU] via SUBCUTANEOUS

## 2019-07-11 MED ORDER — MIDAZOLAM HCL 2 MG/2ML IJ SOLN
1.0000 mg | INTRAMUSCULAR | Status: DC | PRN
  Administered 2019-07-11 (×2): 1 mg via INTRAVENOUS
  Filled 2019-07-11 (×2): qty 2

## 2019-07-11 MED ORDER — SODIUM CHLORIDE 0.9 % IV SOLN: INTRAVENOUS | Status: DC | PRN

## 2019-07-11 MED ORDER — HYDROCORTISONE NA SUCCINATE PF 100 MG IJ SOLR
100.0000 mg | Freq: Three times a day (TID) | INTRAMUSCULAR | Status: DC
  Administered 2019-07-11: 100 mg via INTRAVENOUS
  Filled 2019-07-11: qty 2

## 2019-07-11 MED ORDER — ORAL CARE MOUTH RINSE
15.0000 mL | OROMUCOSAL | Status: DC
  Administered 2019-07-11 – 2019-07-12 (×12): 15 mL via OROMUCOSAL

## 2019-07-11 MED ORDER — ARFORMOTEROL TARTRATE 15 MCG/2ML IN NEBU
15.0000 ug | INHALATION_SOLUTION | Freq: Two times a day (BID) | RESPIRATORY_TRACT | Status: DC
  Administered 2019-07-11 – 2019-07-12 (×3): 15 ug via RESPIRATORY_TRACT
  Filled 2019-07-11 (×3): qty 2

## 2019-07-11 MED ORDER — QUETIAPINE FUMARATE 50 MG PO TABS
50.0000 mg | ORAL_TABLET | Freq: Two times a day (BID) | ORAL | Status: DC
  Administered 2019-07-11: 50 mg
  Filled 2019-07-11: qty 1

## 2019-07-11 MED ORDER — CHLORHEXIDINE GLUCONATE 0.12% ORAL RINSE (MEDLINE KIT)
15.0000 mL | Freq: Two times a day (BID) | OROMUCOSAL | Status: DC
  Administered 2019-07-11 – 2019-07-12 (×4): 15 mL via OROMUCOSAL

## 2019-07-11 MED ORDER — MIDAZOLAM HCL 2 MG/2ML IJ SOLN
2.0000 mg | INTRAMUSCULAR | Status: DC | PRN
  Administered 2019-07-11 – 2019-07-12 (×8): 2 mg via INTRAVENOUS
  Filled 2019-07-11 (×8): qty 2

## 2019-07-11 MED ORDER — MIDAZOLAM HCL 2 MG/2ML IJ SOLN
1.0000 mg | INTRAMUSCULAR | Status: DC | PRN
  Administered 2019-07-11: 1 mg via INTRAVENOUS
  Filled 2019-07-11: qty 2

## 2019-07-11 MED ORDER — SODIUM CHLORIDE 0.9 % IV SOLN
500.0000 mg | Freq: Every day | INTRAVENOUS | Status: DC
  Administered 2019-07-11 (×2): 500 mg via INTRAVENOUS
  Filled 2019-07-11 (×2): qty 500

## 2019-07-11 MED ORDER — FENTANYL 2500MCG IN NS 250ML (10MCG/ML) PREMIX INFUSION
0.0000 ug/h | INTRAVENOUS | Status: DC
  Administered 2019-07-11: 150 ug/h via INTRAVENOUS
  Administered 2019-07-11: 225 ug/h via INTRAVENOUS
  Administered 2019-07-12: 250 ug/h via INTRAVENOUS
  Filled 2019-07-11 (×3): qty 250

## 2019-07-11 MED ORDER — SODIUM CHLORIDE 0.9 % IV SOLN
2.0000 g | Freq: Every day | INTRAVENOUS | Status: DC
  Administered 2019-07-11 (×2): 2 g via INTRAVENOUS
  Filled 2019-07-11: qty 20
  Filled 2019-07-11 (×2): qty 2

## 2019-07-11 MED ORDER — IPRATROPIUM BROMIDE 0.02 % IN SOLN
0.5000 mg | Freq: Four times a day (QID) | RESPIRATORY_TRACT | Status: DC
  Administered 2019-07-11 – 2019-07-12 (×6): 0.5 mg via RESPIRATORY_TRACT
  Filled 2019-07-11 (×6): qty 2.5

## 2019-07-11 MED ORDER — SODIUM CHLORIDE 0.9% FLUSH: 10.0000 mL | INTRAVENOUS | Status: DC | PRN

## 2019-07-11 MED ORDER — FENTANYL BOLUS VIA INFUSION
25.0000 ug | INTRAVENOUS | Status: DC | PRN
  Administered 2019-07-11 – 2019-07-12 (×8): 25 ug via INTRAVENOUS
  Filled 2019-07-11: qty 25

## 2019-07-11 MED ORDER — FENTANYL CITRATE (PF) 100 MCG/2ML IJ SOLN
25.0000 ug | Freq: Once | INTRAMUSCULAR | Status: AC
  Administered 2019-07-11: 25 ug via INTRAVENOUS

## 2019-07-11 MED ORDER — DOCUSATE SODIUM 50 MG/5ML PO LIQD: 100.0000 mg | Freq: Two times a day (BID) | ORAL | Status: DC | PRN

## 2019-07-11 MED ORDER — MIDAZOLAM HCL 2 MG/2ML IJ SOLN: 2.0000 mg | INTRAMUSCULAR | Status: DC | PRN

## 2019-07-11 MED ORDER — ALBUMIN HUMAN 5 % IV SOLN
25.0000 g | Freq: Once | INTRAVENOUS | Status: AC
  Administered 2019-07-11: 25 g via INTRAVENOUS
  Filled 2019-07-11: qty 500

## 2019-07-11 MED ORDER — HYDROCORTISONE NA SUCCINATE PF 100 MG IJ SOLR
50.0000 mg | Freq: Four times a day (QID) | INTRAMUSCULAR | Status: DC
  Administered 2019-07-11 – 2019-07-12 (×5): 50 mg via INTRAVENOUS
  Filled 2019-07-11 (×5): qty 2

## 2019-07-11 MED ORDER — SODIUM CHLORIDE 0.9% FLUSH
10.0000 mL | Freq: Two times a day (BID) | INTRAVENOUS | Status: DC
  Administered 2019-07-11 – 2019-07-12 (×2): 10 mL

## 2019-07-11 MED ORDER — MIDAZOLAM HCL 2 MG/2ML IJ SOLN
INTRAMUSCULAR | Status: AC
  Filled 2019-07-11: qty 2

## 2019-07-11 MED ORDER — NOREPINEPHRINE 4 MG/250ML-% IV SOLN
0.0000 ug/min | INTRAVENOUS | Status: DC
  Administered 2019-07-11: 03:00:00 11 ug/min via INTRAVENOUS
  Administered 2019-07-12: 2 ug/min via INTRAVENOUS
  Filled 2019-07-11 (×2): qty 250

## 2019-07-11 MED ORDER — CHLORHEXIDINE GLUCONATE CLOTH 2 % EX PADS
6.0000 | MEDICATED_PAD | Freq: Every day | CUTANEOUS | Status: DC
  Administered 2019-07-11 – 2019-07-12 (×2): 6 via TOPICAL

## 2019-07-11 MED ORDER — BUDESONIDE 0.25 MG/2ML IN SUSP
0.2500 mg | Freq: Two times a day (BID) | RESPIRATORY_TRACT | Status: DC
  Administered 2019-07-11 – 2019-07-12 (×3): 0.25 mg via RESPIRATORY_TRACT
  Filled 2019-07-11 (×3): qty 2

## 2019-07-11 MED ORDER — PHENYLEPHRINE HCL-NACL 10-0.9 MG/250ML-% IV SOLN: 0.0000 ug/min | INTRAVENOUS | Status: DC

## 2019-07-11 MED ORDER — NOREPINEPHRINE 4 MG/250ML-% IV SOLN
0.0000 ug/min | INTRAVENOUS | Status: DC
  Filled 2019-07-11: qty 250

## 2019-07-11 MED ORDER — GABAPENTIN 250 MG/5ML PO SOLN
100.0000 mg | Freq: Three times a day (TID) | ORAL | Status: DC
  Administered 2019-07-11 (×2): 100 mg
  Filled 2019-07-11 (×3): qty 2

## 2019-07-11 MED ORDER — MIDAZOLAM HCL 2 MG/2ML IJ SOLN: 2.0000 mg | Freq: Once | INTRAMUSCULAR | Status: AC

## 2019-07-11 NOTE — Progress Notes (Addendum)
[facility] Progress Note Patient Name: [name] DOB: 1946/03/26 MRN: [id number]   Date of Service  07/11/2019  HPI/Events of Note  73/M with CHF, presenting with worsening shortness of breath.  CXR shows large bilateral pleural effusions.  CTA with no large Pes, interval increase in bilateral pleural effusions, now moderate to large, and interstitial pulmonary edema, with some areas of atelectasis.  Pt was placed on BIPAP but subsequently needing intubation.   On video assessment, pt intubated and sedated.  He is still moving around and has restraints in place.  BP 135/106, HR 96, RR 16, O2 sats 100%. COVID negative. 2d echo 1/20202- EF [id number], (+) diastolic dysfunction.  eICU Interventions  Acute on chronic respiratory failure CHF exacerbation Large bilateral pleural effusions Possible pneumonia  Agree with empiric antibiotics. Check procalcitonin. Start diuretics when BP tolerates. Monitor I&Os.  Continue fentanyl gtt, add versed IV prn.  Check ABG post-intubation.  Lovenox for DVT prophylaxis.  Protonix for GI prophylaxis.      Intervention Category Evaluation Type: New Patient Evaluation  Elsie Lincoln 07/11/2019, 12:51 AM   1:24 AM Notified if ABG results - 7.34/78.4/139/42 on TV550, PEEP 5, rate 20, 100%. Plan> Continue on current vent settings.  Titrate FiO2 to keep sats >92%.  1:40 AM BP 66/48.   Plan> Start on levophed to keep MAP >60.

## 2019-07-11 NOTE — Procedures (Addendum)
Thoracentesis Procedure Note  Pre-operative Diagnosis: Bilateral effusion, left loculated effusion  Post-operative Diagnosis: same  Indications: Acute on chronic respiratory failure  Procedure Details  Consent: Informed consent was obtained. Risks of the procedure were discussed including: infection, bleeding, pain, pneumothorax.  Under sterile conditions the patient was positioned. Betadine solution and sterile drapes were utilized.  1% buffered lidocaine was used to anesthetize the 5th rib space. Fluid was obtained without any difficulties and minimal blood loss.  A dressing was applied to the wound and wound care instructions were provided.   Findings 900 ml of bloody pleural fluid was obtained. A sample was sent to Pathology for cytogenetics, flow, and cell counts, as well as for infection analysis.  Complications:  None; patient tolerated the procedure well.          Condition: stable  Plan A follow up chest x-ray was ordered. Labs ordered: Pleural cell count, culture, cytology, LDH and protein Serum LDH and protein  Attending Attestation: I performed the procedure.  Rodman Pickle, M.D. [facility] Pulmonary/Critical Care Medicine 07/11/2019 3:36 PM

## 2019-07-11 NOTE — Progress Notes (Signed)
Pt restraints were set to expire at midnight, RN notifed Elink RN to get an order for them to be renewed.

## 2019-07-11 NOTE — Progress Notes (Signed)
[facility] Progress Note Patient Name: [name] DOB: [birth year] MRN: [id number]   Date of Service  07/11/2019  HPI/Events of Note  RN requested to renew restraints. Labs also reviewed. Na 138, K 5.2, Crea 1.26  eICU Interventions  Pt remains intubated.  Bilateral wrist restraints ordered.      Intervention Category Minor Interventions: Other:  Elsie Lincoln 07/11/2019, 10:26 PM

## 2019-07-11 NOTE — Progress Notes (Signed)
..   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 Intubated in ED. PCCM asked to admit  History of present illness   ( History obtained from EMR and account from other providers and details from [facility] as patient is intubated)  73 yr old M with Stage IV Cancer of the cardio-esophageal junction s/p chemoradiation, made DNR DNI by Oncology as pt is not a candidate for immunotherapy or additional IV chemotherapy due to multiple comorbidities including CAD, ischemic cardiomyopathy, OSA, IDDM, COPD, HTN and HLD.  His daughter noted this morning that pt was having swelling in his right leg  Per EDP: Pt brought from home with altered mental status.  Per EMS, sister went to wake up patient this morning was having increased difficulty waking him up.  She states that he was more lethargic.  After several attempts of waking patient up, he was awake but was altered and confused, prompting EMS call.  Patient is on home O2 at about 4 L.  Patient unable to tell EDP why he was there. Was initially managed with non-rebreather which brought O2 sat 85% and at that time pt was failry alert and able to speak and follow commands.  Per the EDP documentation pt was started on BiPAP which helped oxygen levels but ABG showed resp acidosis.  Pt is now Endotracheally Intubated in ED post reversal of DNR/DNI Vitals post BP 135/106, HR 96, RR 16, O2 sats 100%.  Past Medical History  .Marland Kitchen Active Ambulatory Problems    Diagnosis Date Noted  . Diabetes mellitus without complication ([place]) [id number]  . Dyslipidemia 06/24/2007  . Morbid obesity ([place]) 09/02/2008  . Essential hypertension 05/01/2007  . Chronic  diastolic CHF (congestive heart failure) ([place]) 02/25/2009  . OSTEOARTHRITIS 05/01/2007  . Hypoxemia 01/27/2010  . Dyspnea on exertion 09/14/2013  . Abnormal cardiovascular stress test 10/16/2013  . Coronary atherosclerosis of native coronary artery 11/10/2013  . OSA (obstructive sleep apnea) 02/24/2014  . Adjustment disorder with anxious mood 04/25/2014  . Itchy skin 04/25/2014  . Pruritic condition 05/18/2014  . Rash and nonspecific skin eruption 06/27/2014  . Impaired functional mobility and activity tolerance 08/26/2014  . Recurrent falls 08/26/2014  . COPD (chronic obstructive pulmonary disease) ([place])   . Other emphysema ([place]) 08/26/2014  . Scabies 09/10/2014  . Right knee pain 10/26/2014  . Spasm of muscle, back 06/20/2015  . PND (paroxysmal nocturnal dyspnea)   . Dyslipidemia associated with type 2 diabetes mellitus ([place])   . DM (diabetes mellitus) type II, controlled, with peripheral vascular disorder ([place])   . Dysphagia 03/22/2016  . O2 dependent 04/10/2016  . IDDM (insulin dependent diabetes mellitus) ([place]) 04/10/2016  . Mass of esophagus determined by endoscopy   . Cancer of cardio-esophageal junction ([place]) 05/18/2016  . Hypoglycemia   . Dehydration   . Malnutrition of moderate degree 08/29/2016  . Port catheter in place 10/05/2016  . Goals of care, counseling/discussion 11/02/2016  . DNR (do not resuscitate) 05/04/2017  . GIB (gastrointestinal bleeding) 03/18/2018  . Ulcer of esophagus with bleeding 04/03/2018  . History of esophagitis   . Personal history of esophageal cancer   . Ischemic cardiomyopathy 12/10/2018  . Pleural effusion 01/20/2019  Resolved Ambulatory Problems    Diagnosis Date Noted  . COPD (chronic obstructive pulmonary disease) ([place]) 09/14/2013  . Chest pain with moderate risk for cardiac etiology   . Shock ([place]) 08/28/2016  . Slurred speech   . Septic shock ([place]) 08/28/2016   Past Medical History:  Diagnosis Date  . Acute upper GI bleed  02/2018  . Asthma   . CAD in native artery   . Cataract   . Cholelithiasis without cholecystitis   . Chronic combined systolic and diastolic CHF (congestive heart failure) ([place]) 02/25/2009  . Chronic renal insufficiency, stage III (moderate) ([place]) 2017  . Cirrhosis, nonalcoholic ([place]) 99991111  . 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 Coronary artery disease   . Dermatitis 05/2014  . DIABETES MELLITUS, TYPE II 06/24/2007  . Esophageal cancer ([place]) 04/2016  . Esophageal cancer ([place])   . Esophageal cancer ([place])   . Herpes zoster 12/2018  . History of kidney stones   . Hyperkalemia 12/2015  . HYPERLIPIDEMIA 06/24/2007  . Obesity hypoventilation syndrome ([place])   . On home oxygen therapy   . Pancytopenia due to chemotherapy ([place]) 2018  . Pleural effusion on right 08/2017  . Pleural effusion on right 09/2017; 2019/2020  . Presbycusis of both ears 05/2015  . RBBB   . Visual field defect 05/10/2016    Consults:  07/11/2019>>>PCCM  Procedures:  Endotracheal intubation 9/11  Significant Diagnostic Tests:  CT CHEST 9/11 IMPRESSION: 1. Suboptimal exam given partial collimation lung bases. 2. No evidence of central or segmental pulmonary embolism. More distal evaluation is limited due to the extensive atelectatic changes throughout the lungs and respiratory motion artifact. 3. Interval increase in size of bilateral pleural effusions, now moderate to large in size which appear loculated but without abnormal pleural thickening. 4. Some regions of parenchymal hypoattenuation within the atelectatic lung, most notably within the left lower lobe, superimposed infection cannot be excluded. 5. Interlobular septal thickening throughout the remaining aerated portions of the lungs, consistent with interstitial pulmonary edema. 6. Unchanged mediastinal lymphadenopathy. 7. Aortic Atherosclerosis .   CT HEAD 9/11 IMPRESSION: 1. No acute intracranial hemorrhage. 2. Mild  chronic microvascular ischemic changes. Focal area of encephalomalacia in the left parietal lobe. 3. Focal area of thinning and lucency in the left parietal calvarium likely related to prior craniectomy or radiation changes. An infiltrative lesion is less likely  Micro Data:  07/11/2019>>SARSCOV2 negative 07/11/2019>> Blood cx x 2 pending  07/19/2019>> MRSA PCR pending 07/21/2019>> RVP pending  Antimicrobials:  07/24/2019>>Ceftriaxone 07/22/2019>>Azithromycin    Interval  Admitted to ICU overnight. On pressor and vent support.  Objective   Blood pressure (!) 95/57, pulse 85, temperature 100 F (37.8 C), resp. rate 20, height 5\' 8"  (1.727 m), weight 125.1 kg, SpO2 92 %.    Vent Mode: PRVC FiO2 (%):  [60 %-100 %] 60 % Set Rate:  [15 bmp-20 bmp] 20 bmp Vt Set:  [550 mL] 550 mL PEEP:  [5 cmH20] 5 cmH20 Plateau Pressure:  [17 cmH20-27 cmH20] 27 cmH20   Intake/Output Summary (Last 24 hours) at 07/11/2019 0750 Last data filed at 07/11/2019 0600 Gross per 24 hour  Intake 1033.5 ml  Output 400 ml  Net 633.5 ml   Filed Weights   07/11/19 0318  Weight: 125.1 kg   Physical Exam: General: Chronically ill-appearing, opens eyes to touch however does not track or follow commands HENT: [place], AT, ETT in place Eyes: EOMI, no scleral icterus Respiratory: Diminished breath sounds bilaterally, faint  bibasilar crackles Cardiovascular: RRR, -M/R/G, no JVD GI: BS+, distended, nontender Extremities: 2 + lower extremity pitting edema,-tenderness Neuro: Opens eyes to touch, no tracking, does not follow commands, moves extremities spontaneously x 4 Skin: Right upper chest port, LE venous stasis changes GU: Foley in place  CTA 07/17/2019 with interval worsening of bilateral loculated effusions compared to PET 9/9. Negative for PE. Imaging reviewed and interpreted by me.  Assessment & Plan:   Acute respiratory failure with hypoxia likely due to bilateral effusions and interstitial pulmonary edema,  pneumonia OSA Full vent support Wean PEEP/FIO2 for goal SpO2 88-95% VAP  Septic shock secondary to suspected PNA vs cardiogenic shock Continue levophed for goal MAP >65 Continue stress-dose steroids Continue antibiotics for CAP coverage Diagnostic and therapeutic thoracentesis Follow-up cultures Hold on diuresis for now. 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 S/p palliative radiation and chemo Not a candidate for IV chemo due to his co-morbidities, could only take oral Xeloda which has an overall low response.  Was previously DNR/DNI per last Oncology note on 8/21 due to the incurable nature of his cancer and poor prognosis. Overnight CCM discussed GOC and patient status changed to DNR. 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 Consider diagnostic paracentesis Antibiotics as above Check ammonia  AKI, likely pre-renal Monitor UOP/BMET  H/o IDDM SSI Goal BG 140-180 mg/dl  Best practice:  Diet: NPO Pain/Anxiety/Delirium protocol (if indicated): yes per protocol RASS goal -1 to -2 VAP protocol (if indicated): yes DVT prophylaxis: Lovenox and SCDs GI prophylaxis: PPI Glucose control: has a h/o IDDM >>ISS for BG >180mg /dl Mobility: bedrest Code Status: limited Resuscitation>>previously DNR/DNI now intubated  Discussed with HCPOA>> DNR ( no CPR no defib no code blue ok with vasopressors and intubation) Family Communication: Daughter HCPOA , patient's wife has some memory deficits and is reliant on her for care. Changed to DNR. Updated daughter on 07/11/19 regarding plan above  Disposition: ICU  Labs   CBC: Recent Labs  Lab 07/13/2019 1720 07/21/2019 2340  WBC 7.2 6.1  NEUTROABS 5.6  --   HGB 10.1* 9.0*  HCT 38.2* 33.2*  MCV 102.4* 100.9*  PLT 206 [id number]    Basic Metabolic Panel: Recent Labs  Lab 07/17/2019 1720 07/11/19 0059  NA 137 139  K 5.3* 5.3*  CL 85* 90*  CO2 41* 38*  GLUCOSE 183* 103*  BUN 21 23  CREATININE 1.31* 1.33*  CALCIUM 8.3* 8.0*   GFR: Estimated Creatinine Clearance: 62.8 mL/min (A) (by C-G formula based on SCr of 1.33 mg/dL (H)). Recent Labs  Lab 07/06/2019 1720 07/03/2019 1937 07/22/2019 2340 07/11/19 0023  PROCALCITON  --   --   --  0.26  WBC 7.2  --  6.1  --   LATICACIDVEN 2.1* 1.0  --   --     Liver Function Tests: Recent Labs  Lab 07/16/2019 1720  AST 37  ALT 21  ALKPHOS 74  BILITOT 0.8  PROT 7.8  ALBUMIN 2.8*   No results for input(s): LIPASE, AMYLASE in the last 168 hours. No results for input(s): AMMONIA in the last 168 hours.  ABG    Component Value Date/Time   PHART  7.359 07/11/2019 0100   PCO2ART 75.7 (HH) 07/11/2019 0100   PO2ART 135 (H) 07/11/2019 0100   HCO3 42.0 (H) 07/11/2019 0100   TCO2 36 (H) 12/17/2018 1213   O2SAT 98.9 07/11/2019 0100     Coagulation Profile: Recent Labs  Lab 07/11/2019 1720  INR 1.1    Cardiac Enzymes: No results for input(s): CKTOTAL, CKMB, CKMBINDEX, TROPONINI in the last 168 hours.  HbA1C: Hemoglobin A1C  Date/Time Value Ref Range Status  10/24/2018 03:59 PM 6.4 (A) 4.0 - 5.6 % Final  04/24/2018 04:08 PM 5.4 4.0 - 5.6 % Final   HbA1c, POC (controlled diabetic range)  Date/Time Value Ref Range Status  07/25/2018 04:21 PM 6.4 0.0 - 7.0 % Final   Hgb A1c MFr Bld  Date/Time Value Ref Range Status  01/22/2018 10:41 AM 6.5 4.6 - 6.5 % Final    Comment:    Glycemic Control Guidelines for People with Diabetes:Non Diabetic:  <6%Goal of Therapy: <7%Additional Action Suggested:  >8%   03/08/2016 09:46 AM 6.1 4.6 - 6.5 % Final    Comment:    Glycemic Control  Guidelines for People with Diabetes:Non Diabetic:  <6%Goal of Therapy: <7%Additional Action Suggested:  >8%     CBG: Recent Labs  Lab 07/08/19 0910 07/28/2019 1719 07/11/19 0247 07/11/19 0730  GLUCAP 99 170* 76 120*    The patient is critically ill with multiple organ systems failure and requires high complexity decision making for assessment and support, frequent evaluation and titration of therapies, application of advanced monitoring technologies and extensive interpretation of multiple databases.   Critical Care Time devoted to patient care services described in this note is 48 Minutes. This time reflects time of care of this signee Dr. Rodman Pickle. This critical care time does not reflect procedure time, or teaching time or supervisory time of PA/NP/Med student/Med Resident etc but could involve care discussion time.  Rodman Pickle, M.D. [facility] Pulmonary/Critical Care Medicine 07/11/2019 7:51 AM  Pager: [phone] After hours pager: [phone]

## 2019-07-11 NOTE — Progress Notes (Signed)
Pt waking up every 15min-1hr, thrashing head, shaking, yanking on restraints. RN had to increase sedation and bolus pt every hour, RN tried 1mg  of versed at 2158, with no change. RN increased sedation again and bolused from bag at 2300. Still no change, This RN gave 2mg  of versed at 2327. Pt seems to be resting more comfortably, will continue to monitor.

## 2019-07-11 NOTE — Procedures (Signed)
Arterial Catheter Insertion Procedure Note [name] [id number] [birth year]  Procedure: Insertion of Arterial Catheter  Indications: Blood pressure monitoring and Frequent blood sampling  Procedure Details Consent: Unable to obtain consent because of emergent medical necessity. Time Out: Verified patient identification, verified procedure, site/side was marked, verified correct patient position, special equipment/implants available, medications/allergies/relevent history reviewed, required imaging and test results available.  Performed  Maximum sterile technique was used including antiseptics, cap, gloves, gown, hand hygiene, mask and sheet. Skin prep: Chlorhexidine; local anesthetic administered 20 gauge catheter was inserted into left radial artery using the Seldinger technique. ULTRASOUND GUIDANCE USED: NO Evaluation Blood flow good; BP tracing good. Complications: No apparent complications.   [name] 07/11/2019

## 2019-07-11 NOTE — Consult Note (Signed)
Consultation Note Date: 07/11/2019   Patient Name: [name]  DOB: [birth year]  MRN: [id number]  Age / Sex: 74 y.o., male  PCP: Tammi Sou, MD Referring Physician: Margaretha Seeds, MD  Reason for Consultation: Establishing goals of care  HPI/Patient Profile: 73 y.o. male  with past medical history of stage IV cancer of esophageal junction, CAD, OSA, IDDM, COPD, HTN. HLP has been NED but not candidate for additional chemotherapy admitted on 07/09/2019 with AMS and subsequently intubated in the ED.  He was previously DNR/DNI but this was reversed and he is now on mechanical ventilation.   Clinical Assessment and Goals of Care: I met today with patient son who was in the room.    I introduced palliative care as specialized medical care for people living with serious illness. It focuses on providing relief from the symptoms and stress of a serious illness. The goal is to improve quality of life for both the patient and the family.  He reports that his sister is his father's surrogate as his mother has cognitive impairment.  He and his sister have been working together to understand and make decisions, but he has been deferring to her for final decisions.  He reports that his father would "absolutely hate this" but they consented to intubation as they were told he would die without it and he had a good chance of successfully extubating after a "few days of support."    We discussed plan to await results of thoracentesis studies and likely decisions that will be coming, including decision if he should be reintubated if he is successfully extubated then declines again.  His son's wish is "no, once it is out is shouldn't go back in" but he again asks me to discuss with his sister.  Attempted to reach out to daughter this evening, but was unsuccessful.  Will reach out again tomorrow.    SUMMARY OF  RECOMMENDATIONS   - Discussed with son at bedside who reports that patinet's daughter is his surrogate Media planner.  Will reach out to her to further discuss.  Code Status/Advance Care Planning:  Limited code  Prognosis:   Guarded  Discharge Planning: To Be Determined      Primary Diagnoses: Present on Admission: . Acute respiratory failure with hypoxia ([place])   I have reviewed the medical record, interviewed the patient and family, and examined the patient. The following aspects are pertinent.  Past Medical History:  Diagnosis Date  . Acute upper GI bleed 02/2018   Transfused 4 U pRBCs-->EGD showed an esoph ulcer and some gastric ulcers (?radiation-induced), path showed no malignancy or Barrett's.  Rpt EGD 04/28/18: Grade I and small (< 5 mm) distal esophageal varices.  Esoph ulcer healed.  Mild portal hypert gastrop.  No sign of malignancy.  . Asthma    as a child  . CAD in native artery    LAD occluded since 2014->a. cardiac cath 09/2013 showed severely diseased mLAD and diagonal, mild LCx disease, moderate RCA disease, LVEDP 20,  EF 50%. 11/2018 cath->progression of LAD dz, not amenable to PCI  . Cataract    multiple types, bilateral  . Cholelithiasis without cholecystitis   . Chronic combined systolic and diastolic CHF (congestive heart failure) ([place]) 02/25/2009   EF 40-45% 2020; cath 11/2018 showed stable chronic occlusion of LAD w/collateral circ.  Since 2020 echo showed decrease in EF, low dose metop added w/plan to add ACE/ARB  . Chronic renal insufficiency, stage III (moderate) (HCC) 2017   Stage II/III (GFR around 60)  . 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  CT 11/2017 w/ascites and persistent bilat pleural effusions  . 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 COPD (chronic obstructive pulmonary disease) ([place])   . Coronary artery disease    chronically occluded LAD and diagonal with right  to left collaterals, mild disease in the left circ and moderate disease in the mid RCA on medical management with Imdur, ASA, and Plavix.  . Dermatitis 05/2014  . DIABETES MELLITUS, TYPE II 06/24/2007   No diab retpthy as of 08/05/15 eye exam.  . Esophageal cancer ([place]) 04/2016   poorly differentiated carcinoma (Dr. Pyrtle--EGD).  CT C/A/P showed metastatic adenopathy in mediastinum and upper abdomen 05/09/16.  Tx plan is palliative radiation (completed 06/22/16), then palliative systemic chemotherapy (carbo+taxol) was started but as of 08/03/16 onc f/u this was held due to severe knee and ankle arthralgias.  Restarting as of 10/2015 (08/2016 CT showed some disease regression  . Esophageal cancer ([place])    CT 12/2016 showed no residual esoph mass--plan to continue chemo.  Ongoing palliative chemo with carboplatin and taxol q 2 wks as of 03/2017 onc f/u (CT 12/2016 and 04/2017 showed no progression of dz).  Taking a 2 mo break from chemo as of 05/2017.  Pleural effusion-- improved with lasix but onc wanted him to get dx/therap thoracentesis-pt declined.  CT C/A/P no progression/recurr/mets 11/2017.  Marland Kitchen Esophageal cancer ([place])    11/2018 surveillance CTs showed no evidence of dz recurrence.  . Essential hypertension 05/01/2007   Qualifier: Diagnosis of  By: Tiney Rouge CMA, Ellison Hughs     . Herpes zoster 12/2018   Valtrex  . History of kidney stones   . Hyperkalemia 12/2015   Decreased ACE-I by 50% in response, then potassium normalized.  Marland Kitchen HYPERLIPIDEMIA 06/24/2007   03/2018 lipids at goal  . Morbid obesity ([place])   . Obesity hypoventilation syndrome (HCC)    oxygen 24/7 (2 liters [place] as of 02/2016)  . On home oxygen therapy    Oxygen @ 2l/m nasally 24/7 hours  . OSA (obstructive sleep apnea)    not tested; pt scored 4 per stop bang tool results sent to PCP   . OSTEOARTHRITIS 05/01/2007  . Pancytopenia due to chemotherapy ([place]) 2018  . Pleural effusion on right 08/2017   ? malignant vs CHF: pt refuses diagnostic  thoracentesis b/c he states he feels fine, wants to try diuretic first.  . Pleural effusion on right 09/2017; 2019/2020   Thoracentesis.  Most recent thoracentesis 12/2018->no malignancy.  . Presbycusis of both ears 05/2015   [facility]  . Pruritic condition 05/2014   Allergist summer 2015, no new testing.  Marland Kitchen RBBB   . Visual field defect 05/10/2016   Per Dr. Melina Fiddler, O.D.: Rt, loss of inf/temp quad and some loss sup/temp.  ?CVA  ? Pituitary tumor? ?Brain met.  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   Social History   Socioeconomic History  . Marital status: Married    Spouse name: susan  . Number of children: 2  . Years of education: Not on file  . Highest education level: Not on file  Occupational History  . Occupation: Retired  Scientific laboratory technician  . Financial resource strain: Not on file  . Food insecurity    Worry: Not on file    Inability: Not on file  . Transportation needs    Medical: Not on file    Non-medical: Not on file  Tobacco Use  . Smoking status: Former Smoker    Packs/day: 1.50    Years: 30.00    Pack years: 45.00    Types: Cigarettes, Pipe, Cigars    Quit date: 10/30/1983    Years since quitting: 35.7  . Smokeless tobacco: Current User    Types: Chew    Last attempt to quit: 05/15/2016  Substance and Sexual Activity  . Alcohol use: No  . Drug use: No  . Sexual activity: Not Currently  Lifestyle  . Physical activity    Days per week: Not on file    Minutes per session: Not on file  . Stress: Not on file  Relationships  . Social Herbalist on phone: Not on file    Gets together: Not on file    Attends religious service: Not on file    Active member of club or organization: Not on file    Attends meetings of clubs or organizations: Not on file    Relationship status: Not on file  Other Topics Concern  . Not on file  Social History Narrative   Married, one son and one daughter.   His daughter and her two children live  with him.   Coffee daily.  Former smoker.  No alcohol.   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   Attends church weekly-   Oxygen continuous   Family History  Problem Relation Age of Onset  . Heart attack Mother   . Aneurysm Father   . Alcohol abuse Father   . Diabetes Maternal Aunt   . Colon cancer Neg Hx    Scheduled Meds: . arformoterol  15 mcg Nebulization BID  . budesonide (PULMICORT) nebulizer solution  0.25 mg Nebulization BID  . chlorhexidine gluconate (MEDLINE KIT)  15 mL Mouth Rinse BID  . Chlorhexidine Gluconate Cloth  6 each Topical Daily  . enoxaparin (LOVENOX) injection  40 mg Subcutaneous QHS  . hydrocortisone sod succinate (SOLU-CORTEF) inj  50 mg Intravenous Q6H  . insulin aspart  3-9 Units Subcutaneous Q4H  . ipratropium  0.5 mg Nebulization Q6H  . mouth rinse  15 mL Mouth Rinse 10 times per day  . midodrine  10 mg Oral TID WC  . pantoprazole (PROTONIX) IV  40 mg Intravenous QHS  . sodium chloride flush  10-40 mL Intracatheter Q12H   Continuous Infusions: . sodium chloride    . azithromycin Stopped (07/11/19 2221)  . cefTRIAXone (ROCEPHIN)  IV Stopped (07/11/19 2150)  . fentaNYL infusion INTRAVENOUS 200 mcg/hr (07/11/19 2211)  . norepinephrine (LEVOPHED) Adult infusion Stopped (07/11/19 0911)   PRN Meds:.Place/Maintain arterial line **AND** sodium chloride, docusate, fentaNYL, midazolam, midazolam, sodium chloride flush Medications Prior to Admission:  Prior to Admission medications   Medication Sig Start Date End Date Taking? Authorizing Provider  atorvastatin (LIPITOR) 80 MG tablet Take 1 tablet by mouth once daily 04/20/19  Yes Turner, Eber Hong, MD  baclofen (LIORESAL) 10 MG tablet TAKE 1/2 TO 1 (ONE-HALF TO ONE) TABLET BY MOUTH EVERY 8 HOURS AS NEEDED FOR MUSCLE SPASM Patient taking differently: Take 5-10 mg by mouth every 8 (eight) hours as needed for muscle spasms.  03/17/19  Yes McGowen, Adrian Blackwater, MD  beta carotene  w/minerals (OCUVITE) tablet Take 1 tablet by mouth daily.   Yes [provider]  cetirizine (ZYRTEC) 10 MG tablet Take 10 mg by mouth every evening.    Yes [provider]  citalopram (CELEXA) 20 MG tablet Take 1 tablet by mouth once daily 01/26/19  Yes McGowen, Adrian Blackwater, MD  clonazePAM (KLONOPIN) 1 MG tablet Take 1 tablet by mouth twice daily as needed for anxiety 01/26/19  Yes McGowen, Adrian Blackwater, MD  clotrimazole-betamethasone (LOTRISONE) cream APPLY  CREAM TOPICALLY TO AFFECTED AREA TWICE DAILY Patient taking differently: Apply 1 application topically 2 (two) times daily.  12/05/18  Yes McGowen, Adrian Blackwater, MD  ezetimibe (ZETIA) 10 MG tablet Take 1 tablet by mouth once daily 04/06/19  Yes Turner, Eber Hong, MD  furosemide (LASIX) 40 MG tablet Take 1 tablet every other day alternating with a 1/2 tablet every other day. Patient taking differently: Take 20-40 mg by mouth See admin instructions. Alternate between 32m and 40 mg every other day 06/05/19  Yes Turner, Traci R, MD  gabapentin (NEURONTIN) 300 MG capsule TAKE 1 CAPSULE BY MOUTH THREE TIMES DAILY Patient taking differently: Take 300 mg by mouth 3 (three) times daily.  12/02/18  Yes McGowen, PAdrian Blackwater MD  HUMULIN N KWIKPEN 100 UNIT/ML Kiwkpen INJECT 16 UNITS SUBCUTANEOUSLY IN THE MORNING AND 16 IN THE EVENING Patient taking differently: Inject 16 Units into the skin 2 (two) times daily.  05/14/19  Yes McGowen, PAdrian Blackwater MD  hydrOXYzine (ATARAX/VISTARIL) 25 MG tablet TAKE ONE TABLET BY MOUTH AT SUPPER AND 1 TABLET AT BEDTIME FOR INSOMNIA Patient taking differently: Take 25 mg by mouth 2 (two) times daily. At Supper and bedtime 10/06/18  Yes McGowen, PAdrian Blackwater MD  Insulin Lispro (HUMALOG KWIKPEN) 200 UNIT/ML SOPN Inject 20 Units into the skin 2 (two) times daily. 05/09/18  Yes McGowen, PAdrian Blackwater MD  magnesium chloride (SLOW-MAG) 64 MG TBEC SR tablet Take 1 tablet (64 mg total) by mouth daily. 09/01/16  Yes KEber Jones MD  metFORMIN  (GLUCOPHAGE) 1000 MG tablet TAKE 1 TABLET BY MOUTH TWICE DAILY WITH MEALS Patient taking differently: Take 1,000 mg by mouth 2 (two) times daily with a meal.  06/29/19  Yes McGowen, PAdrian Blackwater MD  metoprolol tartrate (LOPRESSOR) 25 MG tablet Take 1/2 (one-half) tablet by mouth twice daily Patient taking differently: Take 12.5 mg by mouth 2 (two) times daily.  06/15/19  Yes Bhagat, Bhavinkumar, PA  mirtazapine (REMERON) 7.5 MG tablet Take 1 tablet (7.5 mg total) by mouth at bedtime. 06/19/19  Yes FTruitt Merle MD  Omega-3 Fatty Acids (FISH OIL) 1200 MG CAPS Take 2,400 mg by mouth 2 (two) times daily.   Yes [provider]  pantoprazole (PROTONIX) 40 MG tablet TAKE 1 TABLET BY MOUTH TWICE DAILY Patient taking differently: Take 40 mg by mouth 2 (two) times daily.  09/30/18  Yes McGowen, PAdrian Blackwater MD  acetaminophen (TYLENOL) 500 MG tablet Take 1,000 mg by mouth every 6 (six) hours as needed for moderate pain.    [provider]  [facility]DELICA LANCETS [id number] USE   TO CHECK GLUCOSE THREE TIMES DAILY 06/24/18   McGowen,  Adrian Blackwater, MD  [facility] ULTRA test strip USE 1 STRIP TO CHECK GLUCOSE THREE TIMES DAILY AS DIRECTED 06/22/19   McGowen, Adrian Blackwater, MD  OXYGEN Inhale 2 L into the lungs continuous.     [provider]   Allergies  Allergen Reactions  . Spironolactone Other (See Comments)    Disorientation, dizzy, lethargic, trouble breathing   Review of Systems Intubated. Unable to obtain  Physical Exam General: Alert, awake, on vent with noted agitation HEENT: No bruits, no goiter, no JVD Heart: Regular rate and rhythm. No murmur appreciated. Lungs: Good air movement, clear Abdomen: Soft, nontender, nondistended, positive bowel sounds.  Ext: 2+ edema Skin: Warm and dry  Vital Signs: BP (!) 119/39   Pulse 69   Temp 98.2 F (36.8 C)   Resp 20   Ht '5\' 8"'  (1.727 m)   Wt 125.1 kg   SpO2 93%   BMI 41.93 kg/m  Pain Scale: CPOT       SpO2: SpO2: 93 % O2 Device:SpO2:  93 % O2 Flow Rate: .   IO: Intake/output summary:   Intake/Output Summary (Last 24 hours) at 07/11/2019 2248 Last data filed at 07/11/2019 2211 Gross per 24 hour  Intake 1660.81 ml  Output 700 ml  Net 960.81 ml    LBM: Last BM Date: (PTA) Baseline Weight: Weight: 125.1 kg Most recent weight: Weight: 125.1 kg     Palliative Assessment/Data:     Time In: 1720 Time Out: 1800 Time Total: 40 Greater than 50%  of this time was spent counseling and coordinating care related to the above assessment and plan.  Signed by: Micheline Rough, MD   Please contact Palliative Medicine Team phone at [phone] for questions and concerns.  For individual provider: See Shea Evans

## 2019-07-11 NOTE — Progress Notes (Signed)
Per lab, results for H&H on body fluid is as follows:   RBC 0.31 Hgb 0.8 HCT 3.5 MCH 22.9  Lab person stated this will not be a part of results due to not being actual blood tested. MD notified.

## 2019-07-12 LAB — BLOOD GAS, ARTERIAL
Acid-Base Excess: 15 mmol/L — ABNORMAL HIGH (ref 0.0–2.0)
Bicarbonate: 38.7 mmol/L — ABNORMAL HIGH (ref 20.0–28.0)
Drawn by: 560031
FIO2: 60
MECHVT: 550 mL
O2 Saturation: 89.8 %
PEEP: 5 cmH2O
Patient temperature: 98.6
RATE: 20 resp/min
pCO2 arterial: 40.2 mmHg (ref 32.0–48.0)
pH, Arterial: 7.589 — ABNORMAL HIGH (ref 7.350–7.450)
pO2, Arterial: 53.9 mmHg — ABNORMAL LOW (ref 83.0–108.0)

## 2019-07-12 LAB — COMPREHENSIVE METABOLIC PANEL
ALT: 18 U/L (ref 0–44)
AST: 35 U/L (ref 15–41)
Albumin: 2.5 g/dL — ABNORMAL LOW (ref 3.5–5.0)
Alkaline Phosphatase: 57 U/L (ref 38–126)
Anion gap: 14 (ref 5–15)
BUN: 29 mg/dL — ABNORMAL HIGH (ref 8–23)
CO2: 35 mmol/L — ABNORMAL HIGH (ref 22–32)
Calcium: 8.2 mg/dL — ABNORMAL LOW (ref 8.9–10.3)
Chloride: 90 mmol/L — ABNORMAL LOW (ref 98–111)
Creatinine, Ser: 1.27 mg/dL — ABNORMAL HIGH (ref 0.61–1.24)
GFR calc Af Amer: >60 mL/min (ref >60)
GFR calc non Af Amer: 55 mL/min — ABNORMAL LOW (ref >60)
Glucose, Bld: 123 mg/dL — ABNORMAL HIGH (ref 70–99)
Potassium: 4.9 mmol/L (ref 3.5–5.1)
Sodium: 139 mmol/L (ref 135–145)
Total Bilirubin: 0.5 mg/dL (ref 0.3–1.2)
Total Protein: 6.6 g/dL (ref 6.5–8.1)

## 2019-07-12 LAB — GLUCOSE, CAPILLARY
Glucose-Capillary: 112 mg/dL — ABNORMAL HIGH (ref 70–99)
Glucose-Capillary: 123 mg/dL — ABNORMAL HIGH (ref 70–99)
Glucose-Capillary: 124 mg/dL — ABNORMAL HIGH (ref 70–99)
Glucose-Capillary: 129 mg/dL — ABNORMAL HIGH (ref 70–99)

## 2019-07-12 LAB — CBC
HCT: 29.1 % — ABNORMAL LOW (ref 39.0–52.0)
Hemoglobin: 8.6 g/dL — ABNORMAL LOW (ref 13.0–17.0)
MCH: 27.3 pg (ref 26.0–34.0)
MCHC: 29.6 g/dL — ABNORMAL LOW (ref 30.0–36.0)
MCV: 92.4 fL (ref 80.0–100.0)
Platelets: 166 10*3/uL (ref 150–400)
RBC: 3.15 MIL/uL — ABNORMAL LOW (ref 4.22–5.81)
RDW: 19.5 % — ABNORMAL HIGH (ref 11.5–15.5)
WBC: 6.2 10*3/uL (ref 4.0–10.5)
nRBC: 0.3 % — ABNORMAL HIGH (ref 0.0–0.2)

## 2019-07-12 MED ORDER — HYDROCORTISONE NA SUCCINATE PF 100 MG IJ SOLR: 50.0000 mg | Freq: Two times a day (BID) | INTRAMUSCULAR | Status: DC

## 2019-07-12 MED ORDER — GLYCOPYRROLATE 1 MG PO TABS: 1.0000 mg | ORAL_TABLET | ORAL | Status: DC | PRN

## 2019-07-12 MED ORDER — DIPHENHYDRAMINE HCL 50 MG/ML IJ SOLN: 25.0000 mg | INTRAMUSCULAR | Status: DC | PRN

## 2019-07-12 MED ORDER — POLYVINYL ALCOHOL 1.4 % OP SOLN
1.0000 [drp] | Freq: Four times a day (QID) | OPHTHALMIC | Status: DC | PRN
  Filled 2019-07-12: qty 15

## 2019-07-12 MED ORDER — ACETAMINOPHEN 325 MG PO TABS: 650.0000 mg | ORAL_TABLET | Freq: Four times a day (QID) | ORAL | Status: DC | PRN

## 2019-07-12 MED ORDER — FUROSEMIDE 10 MG/ML IJ SOLN
40.0000 mg | Freq: Once | INTRAMUSCULAR | Status: AC
  Administered 2019-07-12: 40 mg via INTRAVENOUS
  Filled 2019-07-12: qty 4

## 2019-07-12 MED ORDER — DEXMEDETOMIDINE HCL IN NACL 200 MCG/50ML IV SOLN
0.4000 ug/kg/h | INTRAVENOUS | Status: DC
  Administered 2019-07-12: 0.4 ug/kg/h via INTRAVENOUS
  Filled 2019-07-12: qty 50

## 2019-07-12 MED ORDER — ACETAMINOPHEN 650 MG RE SUPP: 650.0000 mg | Freq: Four times a day (QID) | RECTAL | Status: DC | PRN

## 2019-07-12 MED ORDER — DEXTROSE 5 % IV SOLN: INTRAVENOUS | Status: DC

## 2019-07-12 MED ORDER — MIDAZOLAM HCL 2 MG/2ML IJ SOLN
2.0000 mg | INTRAMUSCULAR | Status: DC | PRN
  Administered 2019-07-12: 2 mg via INTRAVENOUS
  Filled 2019-07-12 (×2): qty 2

## 2019-07-12 MED ORDER — CITALOPRAM HYDROBROMIDE 10 MG PO TABS
10.0000 mg | ORAL_TABLET | Freq: Every day | ORAL | Status: DC
  Administered 2019-07-12: 10 mg via ORAL
  Filled 2019-07-12: qty 1

## 2019-07-12 MED ORDER — GLYCOPYRROLATE 0.2 MG/ML IJ SOLN
0.2000 mg | INTRAMUSCULAR | Status: DC | PRN
  Administered 2019-07-12: 0.2 mg via INTRAVENOUS
  Filled 2019-07-12: qty 1

## 2019-07-12 MED ORDER — MORPHINE SULFATE (PF) 2 MG/ML IV SOLN
2.0000 mg | INTRAVENOUS | Status: DC | PRN
  Administered 2019-07-12: 14:00:00 2 mg via INTRAVENOUS
  Filled 2019-07-12 (×2): qty 1

## 2019-07-12 MED ORDER — GLYCOPYRROLATE 0.2 MG/ML IJ SOLN: 0.2000 mg | INTRAMUSCULAR | Status: DC | PRN

## 2019-07-12 MED ORDER — HYDROMORPHONE HCL 1 MG/ML IJ SOLN
1.0000 mg | INTRAMUSCULAR | Status: DC | PRN
  Administered 2019-07-12: 15:00:00 2 mg via INTRAVENOUS
  Filled 2019-07-12 (×2): qty 2

## 2019-07-14 ENCOUNTER — Telehealth: Payer: Self-pay

## 2019-07-14 LAB — CULTURE, RESPIRATORY W GRAM STAIN: Culture: NORMAL

## 2019-07-14 NOTE — Telephone Encounter (Signed)
Received dc from [facility].  Dc is for cremation and a patient of Doctor Loanne Drilling.  DC will be taken to [facility] for signature.  On 07/15/2019 Received signed dc back from Doctor Loanne Drilling. I called the funeral home to let them know the dc is ready for pickup and also faxed a copy to the funeral home.

## 2019-07-15 LAB — CULTURE, BLOOD (ROUTINE X 2)
Culture: NO GROWTH
Culture: NO GROWTH
Special Requests: ADEQUATE
Special Requests: ADEQUATE

## 2019-07-15 LAB — BODY FLUID CULTURE: Culture: NO GROWTH

## 2019-07-17 ENCOUNTER — Ambulatory Visit: Payer: Medicare Other | Admitting: Hematology

## 2019-07-21 ENCOUNTER — Telehealth: Payer: Medicare Other | Admitting: Physician Assistant

## 2019-07-21 DIAGNOSIS — N183 Chronic kidney disease, stage 3 unspecified: Secondary | ICD-10-CM

## 2019-07-30 NOTE — Death Summary Note (Addendum)
DEATH SUMMARY   Patient Details  Name: [name] MRN: [id number] DOB: 30-Nov-1945  Admission/Discharge Information   Admit Date:  08/09/19  Date of Death: Date of Death: 11-Aug-2019  Time of Death: Time of Death: 1656-02-21  Length of Stay: 2  Referring Physician: Tammi Sou, MD   Reason(s) for Hospitalization  Acute encephalopathy  Diagnoses  Preliminary cause of death: Multi-organ failure secondary to septic shock ssecondary to suspected pulmonary infection (no organism identified)  Secondary Diagnoses (including complications and co-morbidities):  Active Problems:   Morbid obesity (HCC)   Pleural effusion   Acute respiratory failure with hypoxia (HCC)   Sepsis with encephalopathy and septic shock (HCC)   Esophageal cancer, stage IV ([place])   Chronic kidney disease (CKD), stage III (moderate) [facility])   Brief Hospital Course (including significant findings, care, treatment, and services provided and events leading to death)  [name] is a 73 y.o. 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 He was initially tried on BiPAP however failed and was intubated in the ED. CT Chest was significant for bilateral pleural effusions and interstitial pulmonary edema. He underwent thoracentesis without complication. During his hospitalization, he developed septic shock requiring pressor support. Palliative care was involved with patient's history of stage IV cancer. Of note, his recent oncology note stated his cancer was incurable with overall poor prognosis and he had agreed for DNR/DNI. After family discussion, his HCPOA requested for withdrawal of care. On 09-Aug-2023, patient was compassionately extubated and expired at 1657.  Pertinent Labs and Studies  Significant Diagnostic Studies Ct Head Wo Contrast  Result Date:  Aug 09, 2019 CLINICAL DATA:  73 year old male with altered mental status. History of esophageal malignancy. EXAM: CT HEAD WITHOUT CONTRAST TECHNIQUE: Contiguous axial images were obtained from the base of the skull through the vertex without intravenous contrast. COMPARISON:  None. FINDINGS: Brain: There is an area of encephalomalacia in the left parietal lobe. The ventricles and sulci are otherwise appropriate size for patient's age. Mild periventricular and deep white matter chronic microvascular ischemic changes noted. There is no acute intracranial hemorrhage. No mass effect or midline shift. No extra-axial fluid collection. Vascular: No hyperdense vessel or unexpected calcification. Skull: Focal area of thinning and lucency in the left parietal calvarium may represent postsurgical changes of craniectomy or other radiation changes. An infiltrative lesion is less likely. Correlation with history of prior surgery and direct comparison with prior CTs, if available, recommended. No acute calvarial pathology. Sinuses/Orbits: No acute finding. Other: None IMPRESSION: 1. No acute intracranial hemorrhage. 2. Mild chronic microvascular ischemic changes. Focal area of encephalomalacia in the left parietal lobe. 3. Focal area of thinning and lucency in the left parietal calvarium likely related to prior craniectomy or radiation changes. An infiltrative lesion is less likely. Correlation with history of prior surgery and direct comparison with prior CTs, if available, recommended. Electronically Signed   By: Anner Crete M.D.   On: 2019/08/09 20:53   Ct Chest W Contrast  Result Date: 06/19/2019 CLINICAL DATA:  Patient with history of esophageal carcinoma. Follow-up exam. EXAM: CT CHEST, ABDOMEN, AND PELVIS WITH CONTRAST TECHNIQUE: Multidetector CT imaging of the chest, abdomen and pelvis was performed following the standard protocol during bolus administration of intravenous contrast. CONTRAST:  173mL ISOVUE-300  IOPAMIDOL (ISOVUE-300) INJECTION 61% COMPARISON:  CT CAP June 16, 2018 FINDINGS: CT CHEST FINDINGS Cardiovascular: Right  anterior chest wall Port-A-Cath is present with tip terminating in the superior vena cava. Normal heart size. Trace pericardial fluid. Thoracic aortic vascular calcifications. Mediastinum/Nodes: Interval increase in size of right paratracheal lymph node measuring 12 mm (image 17; series 2), previously 6 mm. Similar-appearing 9 mm posterior right paratracheal lymph node (image 13; series 2). Normal appearance of the esophagus. No axillary lymphadenopathy. Lungs/Pleura: Central airways are patent. Interval increase in size of large bilateral loculated pleural effusions. There is consolidation involving the lungs bilaterally favored to represent atelectasis with aeration of the upper lobes. Patchy ground-glass opacities throughout the left greater than right upper lobes Musculoskeletal: Thoracic spine degenerative changes. No aggressive or acute appearing osseous lesions. CT ABDOMEN PELVIS FINDINGS Hepatobiliary: Stable nodular hepatic contour compatible with cirrhosis. Stable subcentimeter hypodense lesion hepatic dome (image 38; series 2), compatible with benign process given stability over time. Multiple stones in the gallbladder lumen. No gallbladder wall thickening. Pancreas: Unremarkable Spleen: Unremarkable Adrenals/Urinary Tract: Normal adrenal glands. Stable bilateral renal cyst. Urinary bladder is unremarkable. Stomach/Bowel: The sigmoid colonic diverticulosis. No CT evidence for acute diverticulitis. Similar-appearing mild wall thickening of the cecum and ascending colon, potentially secondary to third spacing of fluid. Normal morphology of the stomach. Vascular/Lymphatic: Normal caliber abdominal aorta. Peripheral calcified atherosclerotic plaque. No retroperitoneal lymphadenopathy. Reproductive: Central dystrophic calcifications in the prostate. Other: Moderate volume ascites.  This is  increased from prior exam. Musculoskeletal: Lumbar spine degenerative changes. No aggressive or acute appearing osseous lesions. IMPRESSION: 1. Slight interval increase in size right paratracheal lymph node when compared to prior exam. Cannot exclude early recurrent metastatic disease. 2. Similar-appearing posterior right paratracheal lymph node when compared to prior exam. 3. Interval development of large loculated left pleural effusion and interval increase in size of loculated right pleural effusion. Possibility of malignant effusion not entirely excluded. 4. No findings suggestive of metastatic disease in the abdomen or pelvis. 5. Hepatic cirrhosis. 6. Mild wall thickening of the cecum and ascending colon which may be secondary to third spacing of fluid. Colitis not excluded. Recommend clinical correlation. Electronically Signed   By: Lovey Newcomer M.D.   On: 06/19/2019 09:43   Ct Angio Chest Pe W And/or Wo Contrast  Result Date: 07/28/2019 CLINICAL DATA:  Shortness of breath, history of GE junction adenocarcinoma EXAM: CT ANGIOGRAPHY CHEST WITH CONTRAST TECHNIQUE: Multidetector CT imaging of the chest was performed using the standard protocol during bolus administration of intravenous contrast. Multiplanar CT image reconstructions and MIPs were obtained to evaluate the vascular anatomy. CONTRAST:  134mL OMNIPAQUE IOHEXOL 350 MG/ML SOLN COMPARISON:  CT 12/10/2018 FINDINGS: Cardiovascular: Satisfactory opacification of the pulmonary arteries to the segmental level. More distal evaluation is limited due to the extensive atelectatic changes throughout the lungs and respiratory motion artifact. No central or segmental pulmonary embolism is identified. Central pulmonary artery is borderline enlarged. Atherosclerotic plaque within the normal caliber aorta. Normal 3 vessel branching aortic arch major venous structures are unremarkable aside from a tunneled right IJ approach Port-A-Cath with tip positioned near the  superior cavoatrial junction. Normal heart size. No pericardial effusion. Atherosclerotic calcification of the coronary arteries. Calcifications are present upon the aortic leaflets and within the mitral annulus. Mediastinum/Nodes: Redemonstration of an enlarged right paratracheal lymph node measuring 12 mm in size, unchanged from prior several additional prominent paratracheal nodes are grossly similar when compared to prior study. Thyroid gland and thoracic inlet are unremarkable. Trachea is unremarkable. Included portions of the esophagus are free of abnormality. Lungs/Pleura: There is been interval increase in  size of bilateral pleural effusions, now moderate to large in size which appear loculated but without abnormal pleural thickening or a ?split pleura" sign. Adjacent areas of passive atelectasis are noted. Some regions of parenchymal hypoattenuation are visualized within the atelectatic lung, most notably within the left lower lobe (6/83). Interlobular septal thickening is noted throughout the remaining aerated portions of the lungs. Upper Abdomen: Ascites is present in the upper abdomen only portion of the liver is visualized with much of the abdomen collimated from view. Musculoskeletal: Multilevel degenerative changes are present in the imaged portions of the spine. No acute osseous abnormality or suspicious osseous lesion. No suspicious chest wall abnormality. Review of the MIP images confirms the above findings. IMPRESSION: 1. Suboptimal exam given partial collimation lung bases. 2. No evidence of central or segmental pulmonary embolism. More distal evaluation is limited due to the extensive atelectatic changes throughout the lungs and respiratory motion artifact. 3. Interval increase in size of bilateral pleural effusions, now moderate to large in size which appear loculated but without abnormal pleural thickening. 4. Some regions of parenchymal hypoattenuation within the atelectatic lung, most  notably within the left lower lobe, superimposed infection cannot be excluded. 5. Interlobular septal thickening throughout the remaining aerated portions of the lungs, consistent with interstitial pulmonary edema. 6. Unchanged mediastinal lymphadenopathy. 7. Aortic Atherosclerosis (ICD10-I70.0). Electronically Signed   By: Lovena Le M.D.   On: 07/01/2019 20:55   Ct Abdomen Pelvis W Contrast  Result Date: 06/19/2019 CLINICAL DATA:  Patient with history of esophageal carcinoma. Follow-up exam. EXAM: CT CHEST, ABDOMEN, AND PELVIS WITH CONTRAST TECHNIQUE: Multidetector CT imaging of the chest, abdomen and pelvis was performed following the standard protocol during bolus administration of intravenous contrast. CONTRAST:  154mL ISOVUE-300 IOPAMIDOL (ISOVUE-300) INJECTION 61% COMPARISON:  CT CAP June 16, 2018 FINDINGS: CT CHEST FINDINGS Cardiovascular: Right anterior chest wall Port-A-Cath is present with tip terminating in the superior vena cava. Normal heart size. Trace pericardial fluid. Thoracic aortic vascular calcifications. Mediastinum/Nodes: Interval increase in size of right paratracheal lymph node measuring 12 mm (image 17; series 2), previously 6 mm. Similar-appearing 9 mm posterior right paratracheal lymph node (image 13; series 2). Normal appearance of the esophagus. No axillary lymphadenopathy. Lungs/Pleura: Central airways are patent. Interval increase in size of large bilateral loculated pleural effusions. There is consolidation involving the lungs bilaterally favored to represent atelectasis with aeration of the upper lobes. Patchy ground-glass opacities throughout the left greater than right upper lobes Musculoskeletal: Thoracic spine degenerative changes. No aggressive or acute appearing osseous lesions. CT ABDOMEN PELVIS FINDINGS Hepatobiliary: Stable nodular hepatic contour compatible with cirrhosis. Stable subcentimeter hypodense lesion hepatic dome (image 38; series 2), compatible with  benign process given stability over time. Multiple stones in the gallbladder lumen. No gallbladder wall thickening. Pancreas: Unremarkable Spleen: Unremarkable Adrenals/Urinary Tract: Normal adrenal glands. Stable bilateral renal cyst. Urinary bladder is unremarkable. Stomach/Bowel: The sigmoid colonic diverticulosis. No CT evidence for acute diverticulitis. Similar-appearing mild wall thickening of the cecum and ascending colon, potentially secondary to third spacing of fluid. Normal morphology of the stomach. Vascular/Lymphatic: Normal caliber abdominal aorta. Peripheral calcified atherosclerotic plaque. No retroperitoneal lymphadenopathy. Reproductive: Central dystrophic calcifications in the prostate. Other: Moderate volume ascites.  This is increased from prior exam. Musculoskeletal: Lumbar spine degenerative changes. No aggressive or acute appearing osseous lesions. IMPRESSION: 1. Slight interval increase in size right paratracheal lymph node when compared to prior exam. Cannot exclude early recurrent metastatic disease. 2. Similar-appearing posterior right paratracheal lymph node when compared to  prior exam. 3. Interval development of large loculated left pleural effusion and interval increase in size of loculated right pleural effusion. Possibility of malignant effusion not entirely excluded. 4. No findings suggestive of metastatic disease in the abdomen or pelvis. 5. Hepatic cirrhosis. 6. Mild wall thickening of the cecum and ascending colon which may be secondary to third spacing of fluid. Colitis not excluded. Recommend clinical correlation. Electronically Signed   By: Lovey Newcomer M.D.   On: 06/19/2019 09:43   Nm Pet Image Restag (ps) Skull Base To Thigh  Result Date: 07/08/2019 CLINICAL DATA:  Subsequent treatment strategy for esophageal malignancy. EXAM: NUCLEAR MEDICINE PET SKULL BASE TO THIGH TECHNIQUE: 13.0 mCi F-18 FDG was injected intravenously. Full-ring PET imaging was performed from the skull  base to thigh after the radiotracer. CT data was obtained and used for attenuation correction and anatomic localization. Fasting blood glucose: 99 mg/dl COMPARISON:  CT examination from 06/18/2019 and prior PET-CT from 05/21/2016 FINDINGS: Mediastinal blood pool activity: SUV max 2.8 Liver activity: SUV max 3.5 NECK: No significant abnormal hypermetabolic activity in this region. Incidental CT findings: Bilateral common carotid atherosclerotic calcification. CHEST: The dominant right paratracheal lymph node measures 1 cm in short axis on image 43/4 and has a maximum SUV of 2.2, below the blood pool. Bilateral partially loculated pleural effusions with previous mild enhancement along the pleural effusions; there is generally mild uptake along the parietal pleural margins of the pleural effusions, generally with a SUV of about 1.8, with focally up to 4.0 along the right posterior costophrenic region. There is associated atelectasis in the lungs, without a discrete lung nodule. Maximum distal esophageal activity 4.2, likely physiologic. Back on the prior PET-CT of 05/21/2016 the gastroesophageal mass had a maximum SUV of 28.6. Incidental CT findings: Coronary, aortic arch, and branch vessel atherosclerotic vascular disease. Mild cardiomegaly. ABDOMEN/PELVIS: Widespread scattered activity in bowel. Low-grade activity along the ascites, with a SUV of 2.2 in right lower quadrant ascites. No hypermetabolic adenopathy. Incidental CT findings: Suspected cirrhosis. Cholelithiasis. Aortoiliac atherosclerotic vascular disease. Right renal cysts. Considerable sigmoid colon diverticulosis. Subcutaneous edema along the pubis. SKELETON: Accentuated degenerative activity along the severe right glenohumeral arthropathy. Incidental CT findings: Bridging spurring of the sacroiliac joints. Thoracic and lumbar spondylosis. IMPRESSION: 1. Bilateral exudative pleural effusions likely with loculated components. There is some low-grade  activity along the mildly thickened parietal pleural margins but potentially inflammatory rather than neoplastic. Typical SUV along the pleura is 1.8 although focally up to 4.0 along the right posterior costophrenic angle. If not already performed, pleural fluid sampling for cytology might be considered. 2. There is also some widespread ascites with low-grade activity but no definite nodularity. 3. The dominant right paratracheal lymph node measures 1.0 cm in short axis but has a maximum SUV of 2.2, well below the blood pool, accordingly likely not representing recurrent malignancy. 4. Low-grade activity in the distal esophagus, within the typical physiologic range. 5. Other imaging findings of potential clinical significance: Hepatic cirrhosis. Cholelithiasis. Aortic Atherosclerosis (ICD10-I70.0). Right renal cysts. Sigmoid colon diverticulosis. Severe degenerative right glenohumeral arthropathy. Coronary atherosclerosis. Electronically Signed   By: Van Clines M.D.   On: 07/08/2019 11:41   Dg Chest Port 1 View  Result Date: 07/11/2019 CLINICAL DATA:  Post left thoracentesis. EXAM: PORTABLE CHEST 1 VIEW COMPARISON:  07/21/2019 FINDINGS: Right IJ central venous catheter unchanged. Endotracheal tube has tip approximately 3.9 cm above the carina. Enteric tube courses into the stomach as tip is not definitely visualized. Persistent moderate size right  pleural effusion likely with associated basilar atelectasis slightly worse. Significant interval improvement in left-sided effusion with small residual left pleural fluid collection and likely associated left basilar atelectasis. No pneumothorax. Cardiomediastinal silhouette and remainder of the exam is unchanged. IMPRESSION: Significant improvement in patient's left pleural effusion post thoracentesis. Small residual amount of left pleural fluid with basilar atelectasis. No pneumothorax. Worsening moderate size right pleural effusion likely with associated  basilar atelectasis. Tubes and lines as described. Electronically Signed   By: Marin Olp M.D.   On: 07/11/2019 16:22   Dg Chest Portable 1 View  Result Date: 07/08/2019 CLINICAL DATA:  Intubation EXAM: PORTABLE CHEST 1 VIEW COMPARISON:  07/25/2019 5:34 p.m. FINDINGS: Support Apparatus: --Endotracheal tube: Tip is just above the carina. Recommend retraction by 4 cm. --Enteric tube:Tip and side-port in the stomach --Catheter(s):There is a right chest wall power-injectable Port-A-Cath with tip at the cavoatrial junction via a right internal jugular vein approach. --Other: None Bilateral pleural effusions with basilar atelectasis are unchanged. IMPRESSION: 1. Endotracheal tube tip at the carina, just above the right mainstem bronchus. Retraction by 4 cm is recommended. 2. NG tube tip and side-port in the stomach. 3. Unchanged pleural effusions. These results will be called to the ordering clinician or representative by the Radiologist Assistant, and communication documented in the PACS or zVision Dashboard. Electronically Signed   By: Ulyses Jarred M.D.   On: 07/11/2019 23:28   Dg Chest Port 1 View  Result Date: 07/11/2019 CLINICAL DATA:  Sepsis. EXAM: PORTABLE CHEST 1 VIEW COMPARISON:  06/11/2019 and 06/05/2019 FINDINGS: There has been a significant increase in the bilateral pleural effusions, now large with only minimal aeration of the upper lobes bilaterally. Power port in place, unchanged with the tip in the superior vena cava above the cavoatrial junction. Pulmonary vascularity appears normal. No acute bone abnormality. Old left clavicle and proximal right humerus fractures. IMPRESSION: Significant increase in the bilateral pleural effusions, now large. Electronically Signed   By: Lorriane Shire M.D.   On: 07/23/2019 17:52    Microbiology Recent Results (from the past 240 hour(s))  Culture, blood (Routine x 2)     Status: None (Preliminary result)   Collection Time: 07/06/2019  6:19 PM   Specimen:  BLOOD  Result Value Ref Range Status   Specimen Description   Final    BLOOD RIGHT ANTECUBITAL Performed at [facility] [address], [place], [place] [address]    Special Requests   Final    BOTTLES DRAWN AEROBIC AND ANAEROBIC Blood Culture adequate volume Performed at [facility] [address] [place], [place] [address]    Culture   Final    NO GROWTH 4 DAYS Performed at [facility], [place] [address], [place], [place] [address]    Report Status PENDING  Incomplete  Culture, blood (Routine x 2)     Status: None (Preliminary result)   Collection Time: 07/22/2019  6:19 PM   Specimen: BLOOD  Result Value Ref Range Status   Specimen Description   Final    BLOOD PORTA CATH Performed at [facility] [address], [place], [place] [address]    Special Requests   Final    BOTTLES DRAWN AEROBIC AND ANAEROBIC Blood Culture adequate volume Performed at [facility] [address] [place],  [address]    Culture   Final    NO GROWTH 4 DAYS Performed at [facility], [place] [address],  [place], [place] [address]    Report Status PENDING  Incomplete  SARS Coronavirus 2 [facility] order, Performed in [facility] hospital lab) Nasopharyngeal Nasopharyngeal Swab     Status: None   Collection Time: 07/11/2019  6:19 PM   Specimen: Nasopharyngeal Swab  Result Value Ref Range Status   SARS Coronavirus 2 NEGATIVE NEGATIVE Final    Comment: (NOTE) If result is NEGATIVE SARS-CoV-2 target nucleic acids are NOT DETECTED. The SARS-CoV-2 RNA is generally detectable in upper and lower  respiratory specimens during the acute phase of infection. The lowest  concentration of SARS-CoV-2 viral copies this assay can detect is 250  copies / mL. A negative result does not preclude SARS-CoV-2 infection  and should not be used as the sole basis for treatment or other  patient management decisions.  A negative  result may occur with  improper specimen collection / handling, submission of specimen other  than nasopharyngeal swab, presence of viral mutation(s) within the  areas targeted by this assay, and inadequate number of viral copies  (<250 copies / mL). A negative result must be combined with clinical  observations, patient history, and epidemiological information. If result is POSITIVE SARS-CoV-2 target nucleic acids are DETECTED. The SARS-CoV-2 RNA is generally detectable in upper and lower  respiratory specimens dur ing the acute phase of infection.  Positive  results are indicative of active infection with SARS-CoV-2.  Clinical  correlation with patient history and other diagnostic information is  necessary to determine patient infection status.  Positive results do  not rule out bacterial infection or co-infection with other viruses. If result is PRESUMPTIVE POSTIVE SARS-CoV-2 nucleic acids MAY BE PRESENT.   A presumptive positive result was obtained on the submitted specimen  and confirmed on repeat testing.  While 2019 novel coronavirus  (SARS-CoV-2) nucleic acids may be present in the submitted sample  additional confirmatory testing may be necessary for epidemiological  and / or clinical management purposes  to differentiate between  SARS-CoV-2 and other Sarbecovirus currently known to infect humans.  If clinically indicated additional testing with an alternate test  methodology [phone]) is advised. The SARS-CoV-2 RNA is generally  detectable in upper and lower respiratory sp ecimens during the acute  phase of infection. The expected result is Negative. Fact Sheet for Patients:  StrictlyIdeas.no Fact Sheet for Healthcare Providers: BankingDealers.co.za This test is not yet approved or cleared by the [place] FDA and has been authorized for detection and/or diagnosis of SARS-CoV-2 by FDA under an Emergency Use Authorization  (EUA).  This EUA will remain in effect (meaning this test can be used) for the duration of the COVID-19 declaration under Section 564(b)(1) of the Act, 21 U.S.C. section 360bbb-3(b)(1), unless the authorization is terminated or revoked sooner. Performed at [facility], [place] [address], [place], [place] [address]   MRSA PCR Screening     Status: None   Collection Time: 07/11/19 12:20 AM   Specimen: Nasal Mucosa; Nasopharyngeal  Result Value Ref Range Status   MRSA by PCR NEGATIVE NEGATIVE Final    Comment:        The GeneXpert MRSA Assay (FDA approved for NASAL specimens only), is one component of a comprehensive MRSA colonization surveillance program. It is not intended to diagnose MRSA infection nor to guide or monitor treatment for MRSA infections. Performed at [facility], [place] [address] [place], [place] [address]   Respiratory Panel by PCR     Status: None   Collection Time: 07/11/19  2:42 AM   Specimen: Nasopharyngeal Swab; Respiratory  Result Value Ref Range Status   Adenovirus NOT DETECTED NOT DETECTED Final   Coronavirus 229E NOT DETECTED NOT DETECTED Final    Comment: (NOTE) The Coronavirus on the Respiratory Panel, DOES NOT test for the novel  Coronavirus (2019 nCoV)    Coronavirus HKU1 NOT DETECTED NOT DETECTED Final   Coronavirus NL63 NOT DETECTED NOT DETECTED Final   Coronavirus OC43 NOT DETECTED NOT DETECTED Final   Metapneumovirus NOT DETECTED NOT DETECTED Final   Rhinovirus / Enterovirus NOT DETECTED NOT DETECTED Final   Influenza A NOT DETECTED NOT DETECTED Final   Influenza B NOT DETECTED NOT DETECTED Final   Parainfluenza Virus 1 NOT DETECTED NOT DETECTED Final   Parainfluenza Virus 2 NOT DETECTED NOT DETECTED Final   Parainfluenza Virus 3 NOT DETECTED NOT DETECTED Final   Parainfluenza Virus 4 NOT DETECTED NOT DETECTED Final   Respiratory Syncytial Virus NOT DETECTED NOT DETECTED Final   Bordetella pertussis  NOT DETECTED NOT DETECTED Final   Chlamydophila pneumoniae NOT DETECTED NOT DETECTED Final   Mycoplasma pneumoniae NOT DETECTED NOT DETECTED Final    Comment: Performed at [facility], [place]. [address] [place], [place] [address]  Culture, respiratory (non-expectorated)     Status: None   Collection Time: 07/11/19 12:15 PM   Specimen: Tracheal Aspirate; Respiratory  Result Value Ref Range Status   Specimen Description   Final    TRACHEAL ASPIRATE Performed at [place] [address], [place], [place] [address]    Special Requests   Final    NONE Performed at [facility], [place] [address], [place], [place] [address]    Gram Stain   Final    FEW WBC PRESENT,BOTH PMN AND MONONUCLEAR FEW GRAM POSITIVE COCCI IN PAIRS RARE GRAM POSITIVE RODS    Culture   Final    FEW Consistent with normal respiratory flora. Performed at [facility], [place] [address], [name], [place] [address]    Report Status 07/14/2019 FINAL  Final  Body fluid culture (includes gram stain)     Status: None (Preliminary result)   Collection Time: 07/11/19  3:38 PM   Specimen: Pleural Fluid  Result Value Ref Range Status   Specimen Description   Final    PLEURAL FLUID Performed at [facility], [place] [address] [place], [place] [address]    Special Requests   Final    NONE Performed at [facility], [place] [address], [place], [place] [address]    Gram Stain   Final    RARE WBC PRESENT, PREDOMINANTLY MONONUCLEAR NO ORGANISMS SEEN    Culture   Final    NO GROWTH 3 DAYS Performed at [facility], [place] [address], [place], [place] [address]    Report Status PENDING  Incomplete    Lab Basic Metabolic Panel: Recent Labs  Lab 07/11/2019 1720 07/11/19 0059 07/11/19 1812 08-09-19 0521  NA 137 139 138 139  K 5.3* 5.3* 5.2* 4.9  CL 85* 90* 90* 90*  CO2 41* 38* 33* 35*  GLUCOSE 183* 103* 104* 123*  BUN 21 23 27* 29*  CREATININE  1.31* 1.33* 1.26* 1.27*  CALCIUM 8.3* 8.0* 7.9* 8.2*   Liver Function Tests: Recent Labs  Lab 07/23/2019 1720 07/11/19 1539 August 09, 2019 0521  AST 37  --  35  ALT 21  --  18  ALKPHOS 74  --  57  BILITOT 0.8  --  0.5  PROT 7.8 4.4* 6.6  ALBUMIN 2.8*  --  2.5*   No results for input(s): LIPASE, AMYLASE in the last 168 hours. Recent Labs  Lab 07/11/19 1213  AMMONIA 23   CBC: Recent Labs  Lab 07/09/2019 1720 07/15/2019 2340 07/11/19 1622 August 11, 2019 0521  WBC 7.2 6.1 6.7 6.2  NEUTROABS 5.6  --   --   --   HGB 10.1* 9.0* 8.4* 8.6*  HCT 38.2* 33.2* 29.5* 29.1*  MCV 102.4* 100.9* 95.2 92.4  PLT 206 161 165 166   Cardiac Enzymes: No results for input(s): CKTOTAL, CKMB, CKMBINDEX, TROPONINI in the last 168 hours. Sepsis Labs: Recent Labs  Lab 07/23/2019 1720 07/20/2019 1937 07/01/2019 2340 07/11/19 0023 07/11/19 0811 07/11/19 1622 August 11, 2019 0521  PROCALCITON  --   --   --  0.26  --   --   --   WBC 7.2  --  6.1  --   --  6.7 6.2  LATICACIDVEN 2.1* 1.0  --   --  1.1  --   --     Procedures/Operations  9/11 Intubation 9/12 Thoracentesis  Chi Rodman Pickle 07/14/2019, 2:42 PM

## 2019-07-30 NOTE — Progress Notes (Signed)
76mL of fentanyl wasted in sink, witnessed by Shyrl Numbers, RN.

## 2019-07-30 NOTE — Procedures (Signed)
Extubation Procedure Note  Patient Details:   Name: [name] DOB: 1945/12/06 MRN: [id number]   Airway Documentation:    Vent end date: 07-Aug-2019 Vent end time: 1600   Evaluation  O2 sats: currently acceptable Complications: No apparent complications Patient did tolerate procedure well. Bilateral Breath Sounds: Clear, Diminished   No   Pt was compassionately extubated to comfort care per MD order and family request. Pt was suctioned and had a positive cuff leak prior to extubation. Pt was extubated to room air per families request. Pt currently resting with family at the bedside. RT will continue to monitor pt status.    Medea Deines A Kanye Depree 08-07-2019, 4:06 PM

## 2019-07-30 NOTE — Progress Notes (Signed)
..   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 Intubated in ED. PCCM asked to admit  History of present illness   ( History obtained from EMR and account from other providers and details from [facility] as patient is intubated)  73 yr old M with Stage IV Cancer of the cardio-esophageal junction s/p chemoradiation, made DNR DNI by Oncology as pt is not a candidate for immunotherapy or additional IV chemotherapy due to multiple comorbidities including CAD, ischemic cardiomyopathy, OSA, IDDM, COPD, HTN and HLD.  His daughter noted this morning that pt was having swelling in his right leg  Per EDP: Pt brought from home with altered mental status.  Per EMS, sister went to wake up patient this morning was having increased difficulty waking him up.  She states that he was more lethargic.  After several attempts of waking patient up, he was awake but was altered and confused, prompting EMS call.  Patient is on home O2 at about 4 L.  Patient unable to tell EDP why he was there. Was initially managed with non-rebreather which brought O2 sat 85% and at that time pt was failry alert and able to speak and follow commands.  Per the EDP documentation pt was started on BiPAP which helped oxygen levels but ABG showed resp acidosis.  Pt is now Endotracheally Intubated in ED post reversal of DNR/DNI Vitals post BP 135/106, HR 96, RR 16, O2 sats 100%.  Past Medical History  .Mar[place] Kitchen Active Ambulatory Problems    Diagnosis Date Noted  . Diabetes mellitus without complication ([place]) [id number]  . Dyslipidemia 06/24/2007  . Morbid obesity ([place]) 09/02/2008  . Essential hypertension 05/01/2007  . Chronic  diastolic CHF (congestive heart failure) ([place]) 02/25/2009  . OSTEOARTHRITIS 05/01/2007  . Hypoxemia 01/27/2010  . Dyspnea on exertion 09/14/2013  . Abnormal cardiovascular stress test 10/16/2013  . Coronary atherosclerosis of native coronary artery 11/10/2013  . OSA (obstructive sleep apnea) 02/24/2014  . Adjustment disorder with anxious mood 04/25/2014  . Itchy skin 04/25/2014  . Pruritic condition 05/18/2014  . Rash and nonspecific skin eruption 06/27/2014  . Impaired functional mobility and activity tolerance 08/26/2014  . Recurrent falls 08/26/2014  . COPD (chronic obstructive pulmonary disease) ([place])   . Other emphysema ([place]) 08/26/2014  . Scabies 09/10/2014  . Right knee pain 10/26/2014  . Spasm of muscle, back 06/20/2015  . PND (paroxysmal nocturnal dyspnea)   . Dyslipidemia associated with type 2 diabetes mellitus ([place])   . DM (diabetes mellitus) type II, controlled, with peripheral vascular disorder ([place])   . Dysphagia 03/22/2016  . O2 dependent 04/10/2016  . IDDM (insulin dependent diabetes mellitus) ([place]) 04/10/2016  . Mass of esophagus determined by endoscopy   . Cancer of cardio-esophageal junction ([place]) 05/18/2016  . Hypoglycemia   . Dehydration   . Malnutrition of moderate degree 08/29/2016  . Port catheter in place 10/05/2016  . Goals of care, counseling/discussion 11/02/2016  . DNR (do not resuscitate) 05/04/2017  . GIB (gastrointestinal bleeding) 03/18/2018  . Ulcer of esophagus with bleeding 04/03/2018  . History of esophagitis   . Personal history of esophageal cancer   . Ischemic cardiomyopathy 12/10/2018  . Pleural effusion 01/20/2019  Resolved Ambulatory Problems    Diagnosis Date Noted  . COPD (chronic obstructive pulmonary disease) ([place]) 09/14/2013  . Chest pain with moderate risk for cardiac etiology   . Shock ([place]) 08/28/2016  . Slurred speech   . Septic shock ([place]) 08/28/2016   Past Medical History:  Diagnosis Date  . Acute upper GI bleed  02/2018  . Asthma   . CAD in native artery   . Cataract   . Cholelithiasis without cholecystitis   . Chronic combined systolic and diastolic CHF (congestive heart failure) ([place]) 02/25/2009  . Chronic renal insufficiency, stage III (moderate) ([place]) 2017  . Cirrhosis, nonalcoholic ([place]) [id number]  . 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 Coronary artery disease   . Dermatitis 05/2014  . DIABETES MELLITUS, TYPE II 06/24/2007  . Esophageal cancer ([place]) 04/2016  . Esophageal cancer ([place])   . Esophageal cancer ([place])   . Herpes zoster 12/2018  . History of kidney stones   . Hyperkalemia 12/2015  . HYPERLIPIDEMIA 06/24/2007  . Obesity hypoventilation syndrome ([place])   . On home oxygen therapy   . Pancytopenia due to chemotherapy ([place]) 2018  . Pleural effusion on right 08/2017  . Pleural effusion on right 09/2017; 2019/2020  . Presbycusis of both ears 05/2015  . RBBB   . Visual field defect 05/10/2016    Consults:  07/08/2019>>>PCCM  Procedures:  Endotracheal intubation 9/11  Significant Diagnostic Tests:  CT CHEST 9/11 IMPRESSION: 1. Suboptimal exam given partial collimation lung bases. 2. No evidence of central or segmental pulmonary embolism. More distal evaluation is limited due to the extensive atelectatic changes throughout the lungs and respiratory motion artifact. 3. Interval increase in size of bilateral pleural effusions, now moderate to large in size which appear loculated but without abnormal pleural thickening. 4. Some regions of parenchymal hypoattenuation within the atelectatic lung, most notably within the left lower lobe, superimposed infection cannot be excluded. 5. Interlobular septal thickening throughout the remaining aerated portions of the lungs, consistent with interstitial pulmonary edema. 6. Unchanged mediastinal lymphadenopathy. 7. Aortic Atherosclerosis .   CT HEAD 9/11 IMPRESSION: 1. No acute intracranial hemorrhage. 2. Mild  chronic microvascular ischemic changes. Focal area of encephalomalacia in the left parietal lobe. 3. Focal area of thinning and lucency in the left parietal calvarium likely related to prior craniectomy or radiation changes. An infiltrative lesion is less likely  Left Pleural Effusion 9/12 Cell Count WBC 617 with N 17%, L 34% and 48% Monocyte-Macrophage, E 1% LDH PL/Serum 251/242 Protein PL/Serum  4.3/4.4 Cytology - pending Interpretation: Exudative effusion   Micro Data:  07/18/2019>>SARSCOV2 negative 07/16/2019>> Blood cx x 2 pending  07/01/2019>> MRSA PCR pending 07/03/2019>> RVP pending  Antimicrobials:  07/11/2019>>Ceftriaxone 07/20/2019>>Azithromycin    Interval  S/p left thoracentesis. Agitated overnight  Objective   Blood pressure (!) 114/95, pulse 68, temperature 98.2 F (36.8 C), resp. rate (!) 24, height 5\' 8"  (1.727 m), weight 125.7 kg, SpO2 91 %.    Vent Mode: PRVC FiO2 (%):  [60 %] 60 % Set Rate:  [20 bmp] 20 bmp Vt Set:  [550 mL] 550 mL PEEP:  [5 cmH20] 5 cmH20 Plateau Pressure:  [24 cmH20-33 cmH20] 30 cmH20   Intake/Output Summary (Last 24 hours) at 2019-07-19 0852 Last data filed at 19-Jul-2019 0600 Gross per 24 hour  Intake 732.73 ml  Output 575 ml  Net 157.73 ml   Filed Weights   07/11/19 0318 2019-07-19 0500  Weight: 125.1 kg 125.7 kg   Physical  Exam: General: Chronically ill-appearing, agitated HENT: [place], AT, ETT in place Eyes: EOMI, no scleral icterus Respiratory: Diminished breath sounds bilaterally, bibasilar crackles Cardiovascular: RRR, -M/R/G, no JVD GI: BS+, soft, nontender Extremities: 2+ pitting edema in lower extremities bilaterally Neuro: Opens eyes to voice, does not track, intermittently follows commands, moves extremities x 4 Skin: Right PAC, LE venous stasis changes GU: Foley in place  CXR 9/12 S/p thoracentesis with improved aeration of left lung. Moderate right pleural effusion with possible   Assessment & Plan:   Acute  respiratory failure with hypoxia likely due to bilateral effusions and interstitial pulmonary edema, pneumonia OSA Full vent support Wean PEEP/FIO2 for goal SpO2 88-95% ABG now VAP  Septic shock secondary to suspected PNA vs cardiogenic shock Off pressors Wean stress-dose steroids: 50mg  BID Continue antibiotics for CAP coverage x 7 days F/u pleural labs and studies Follow-up cultures  Acute encephalopathy likely secondary to above - Hypercarbia on admission corrected RASS goal 0 and -1: PRN fentanyl and versed Restart home Celexa  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 Bedside US with minimal fluid not amenable to paracentesis Antibiotics as above  AKI. 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 S/p palliative radiation and chemo Not a candidate for IV chemo due to his co-morbidities, could only take oral Xeloda which has an overall low response.  Was previously DNR/DNI per last Oncology note on 8/21 due to the incurable nature of his cancer and poor prognosis. Overnight CCM discussed GOC and patient status changed to DNR. Appreciate Palliative Care input.  Best practice:  Diet: NPO Pain/Anxiety/Delirium protocol (if indicated): yes per protocol RASS goal -1 to -2 VAP protocol (if indicated): yes DVT prophylaxis: Lovenox and SCDs GI prophylaxis: PPI Glucose control: has a h/o IDDM >>ISS for BG >180mg /dl Mobility: bedrest Code Status: limited Resuscitation>>previously DNR/DNI now intubated  Discussed with HCPOA>> DNR ( no CPR no defib no code blue ok with vasopressors and intubation) Family Communication: Daughter HCPOA , patient's wife has some memory deficits and is reliant on her for care. Changed to DNR. Updated daughter on 07/11/19 regarding plan above  Disposition: ICU  Labs   CBC: Recent Labs  Lab 07/11/2019 1720 07/26/2019 2340 07/11/19 1622 07/30/2019 0521  WBC 7.2 6.1 6.7 6.2  NEUTROABS 5.6  --   --   --   HGB 10.1* 9.0* 8.4* 8.6*  HCT 38.2* 33.2* 29.5* 29.1*  MCV 102.4* 100.9* 95.2 92.4  PLT 206 161 165 [id number]    Basic Metabolic Panel: Recent Labs  Lab 07/16/2019 1720 07/11/19 0059 07/11/19 1812 07-30-19 0521  NA 137 139 138 139  K 5.3* 5.3* 5.2* 4.9  CL 85* 90* 90* 90*  CO2 41* 38* 33* 35*  GLUCOSE 183* 103* 104* 123*  BUN 21 23 27* 29*  CREATININE 1.31* 1.33* 1.26* 1.27*  CALCIUM 8.3* 8.0* 7.9* 8.2*   GFR: Estimated Creatinine Clearance: 65.9 mL/min (A) (by C-G formula based on SCr of 1.27 mg/dL (H)). Recent Labs  Lab 07/02/2019 1720 06/30/2019 1937 07/04/2019 2340 07/11/19 0023 07/11/19 0811 07/11/19 1622 07-30-19 0521  PROCALCITON  --   --   --  0.26  --   --   --   WBC 7.2  --  6.1  --   --  6.7 6.2  LATICACIDVEN 2.1* 1.0  --   --  1.1  --   --     Liver Function Tests: Recent Labs  Lab 07/26/2019 1720 07/11/19 1539 07/21/19 0521  AST 37  --  35  ALT 21  --  18  ALKPHOS 74  --  57  BILITOT 0.8  --  0.5  PROT 7.8 4.4* 6.6  ALBUMIN 2.8*  --  2.5*   No results for input(s): LIPASE, AMYLASE in the last 168 hours. Recent Labs  Lab 07/11/19 1213  AMMONIA 23    ABG    Component Value Date/Time   PHART 7.359 07/11/2019 0100   PCO2ART 75.7 (HH) 07/11/2019 0100   PO2ART 135 (H) 07/11/2019 0100   HCO3 42.0 (H) 07/11/2019 0100   TCO2 36 (H) 12/17/2018 1213   O2SAT 98.9 07/11/2019 0100     Coagulation Profile: Recent Labs  Lab 07/04/2019 1720  INR 1.1    Cardiac Enzymes: No results for input(s): CKTOTAL, CKMB, CKMBINDEX, TROPONINI in the last 168 hours.  HbA1C: Hemoglobin A1C  Date/Time Value Ref Range Status  10/24/2018 03:59 PM 6.4 (A) 4.0 - 5.6 % Final  04/24/2018 04:08 PM 5.4 4.0 - 5.6 % Final   HbA1c, POC (controlled diabetic range)  Date/Time Value Ref Range Status  07/25/2018 04:21  PM 6.4 0.0 - 7.0 % Final   Hgb A1c MFr Bld  Date/Time Value Ref Range Status  01/22/2018 10:41 AM 6.5 4.6 - 6.5 % Final    Comment:    Glycemic Control Guidelines for People with Diabetes:Non Diabetic:  <6%Goal of Therapy: <7%Additional Action Suggested:  >8%   03/08/2016 09:46 AM 6.1 4.6 - 6.5 % Final    Comment:    Glycemic Control Guidelines for People with Diabetes:Non Diabetic:  <6%Goal of Therapy: <7%Additional Action Suggested:  >8%     CBG: Recent Labs  Lab 07/11/19 1613 07/11/19 1955 07/21/19 0020 2019/07/21 0342 07-21-19 0830  GLUCAP 111* 119* 123* 124* 112*    The patient is critically ill with multiple organ systems failure and requires high complexity decision making for assessment and support, frequent evaluation and titration of therapies, application of advanced monitoring technologies and extensive interpretation of multiple databases.   Critical Care Time devoted to patient care services described in this note is 38 Minutes. This time reflects time of care of this signee Dr. Rodman Pickle.  time.  Rodman Pickle, M.D. [facility] Pulmonary/Critical Care Medicine 07/21/2019 8:52 AM  Pager: [phone] After hours pager: [phone]

## 2019-07-30 NOTE — Progress Notes (Addendum)
[facility] Progress Note  Patient's son, Randall Hiss, approached staff regarding changing patient's goals of care to comfort, stating his father would have never wanted to be on life support to start with. We discussed the sequelae of events if we decided to withdraw care today and that patient had high likelihood of passing away. He expressed understanding and stated remained consistent that aggressive care was not part of his father's wishes and . I let Randall Hiss know I would call his sister, patient's HCPOA, Delores. I reviewed the same course with [facility] regarding withdrawal of care and low chance of survival. If patient does survival for >24 hours, we would pursue hospice options.  Both Randall Hiss and Delores will contact each other to discuss which additional family members are to be present prior to extubation. She would like to withdraw care today.  Plan: Changed code status to DNR-Comfort Order morphine gtt No further escalation of care Plan for compassionate extubation after family arrives  Rodman Pickle, M.D. [facility] Pulmonary/Critical Care Medicine 07/28/19 1:24 PM   Additional CCM time: 60 minutes. Discussed care with multiple family members, charge nurse and Therapist, sports.

## 2019-07-30 NOTE — Telephone Encounter (Signed)
Patient was admitted with MS changes and progressive decline from metastatic lung CA and passed away

## 2019-07-30 NOTE — Progress Notes (Signed)
Pt had on bilateral wrist restraints while intubated due to attempts of pulling tube out. After decision made by family to extubate patient and make comfort care, RN took wrist restraints off as soon as patient extubated. Pt did not die with restraints on.

## 2019-07-30 NOTE — Progress Notes (Signed)
Daily Progress Note   Patient Name: [name]       Date: 2019/08/08 DOB: [birth year]  Age: 73 y.o. MRN#: [id number] Attending Physician: Margaretha Seeds, MD Primary Care Physician: Tammi Sou, MD Admit Date: 07/05/2019  Reason for Consultation/Follow-up: Establishing goals of care, Non pain symptom management, Pain control and Withdrawal of life-sustaining treatment  Subjective: Received call from Dr. Loanne Drilling that family has expressed desire for withdrawal of mechanical ventilation.  Family to arrive at 1430.  I met with family and discussed again regarding goals of care.  Family clear that patient has expressed he would not want prolonged mechanical ventilation.  They would like to proceed with one way extubation to honor his wishes.  See below.  Length of Stay: 2  Current Medications: Scheduled Meds:  . arformoterol  15 mcg Nebulization BID  . budesonide (PULMICORT) nebulizer solution  0.25 mg Nebulization BID  . chlorhexidine gluconate (MEDLINE KIT)  15 mL Mouth Rinse BID  . Chlorhexidine Gluconate Cloth  6 each Topical Daily  . citalopram  10 mg Oral Daily  . enoxaparin (LOVENOX) injection  40 mg Subcutaneous QHS  . hydrocortisone sod succinate (SOLU-CORTEF) inj  50 mg Intravenous Q12H  . insulin aspart  3-9 Units Subcutaneous Q4H  . ipratropium  0.5 mg Nebulization Q6H  . mouth rinse  15 mL Mouth Rinse 10 times per day  . midodrine  10 mg Oral TID WC  . pantoprazole (PROTONIX) IV  40 mg Intravenous QHS  . sodium chloride flush  10-40 mL Intracatheter Q12H    Continuous Infusions: . sodium chloride    . azithromycin Stopped (07/11/19 2221)  . cefTRIAXone (ROCEPHIN)  IV Stopped (07/11/19 2150)  . dexmedetomidine (PRECEDEX) IV infusion 0.5 mcg/kg/hr (Aug 08, 2019  1000)  . dextrose    . fentaNYL infusion INTRAVENOUS 275 mcg/hr (08-08-19 1000)  . norepinephrine (LEVOPHED) Adult infusion 2 mcg/min (Aug 08, 2019 1039)    PRN Meds: Place/Maintain arterial line **AND** sodium chloride, acetaminophen **OR** acetaminophen, diphenhydrAMINE, docusate, fentaNYL, glycopyrrolate **OR** glycopyrrolate **OR** glycopyrrolate, HYDROmorphone (DILAUDID) injection, midazolam, morphine injection, polyvinyl alcohol, sodium chloride flush  Physical Exam         General: On vent with intermittent noted agitation HEENT: No bruits, no goiter, no JVD Heart: Regular rate and rhythm. No murmur appreciated. Lungs: diminshed Abdomen: Soft, nontender, nondistended,  positive bowel sounds.  Ext: 2+ edema Skin: Warm and dry  Vital Signs: BP (!) 138/91 (BP Location: Left Arm)   Pulse 66   Temp 98.8 F (37.1 C)   Resp (!) 21   Ht '5\' 8"'  (1.727 m)   Wt 125.7 kg   SpO2 94%   BMI 42.14 kg/m  SpO2: SpO2: 94 % O2 Device: O2 Device: Room Air O2 Flow Rate:    Intake/output summary:   Intake/Output Summary (Last 24 hours) at 07-17-19 1733 Last data filed at Jul 17, 2019 1000 Gross per 24 hour  Intake 767.88 ml  Output 275 ml  Net 492.88 ml   LBM: Last BM Date: (PTA) Baseline Weight: Weight: 125.1 kg Most recent weight: Weight: 125.7 kg       Palliative Assessment/Data:      Patient Active Problem List   Diagnosis Date Noted  . Sepsis with encephalopathy and septic shock ([place])   . Esophageal cancer, stage IV ([place])   . Acute respiratory failure with hypoxia ([place]) 07/16/2019  . Pleural effusion 01/20/2019  . Ischemic cardiomyopathy 12/10/2018  . History of esophagitis   . Personal history of esophageal cancer   . Ulcer of esophagus with bleeding 04/03/2018  . GIB (gastrointestinal bleeding) 03/18/2018  . DNR (do not resuscitate) 05/04/2017  . Goals of care, counseling/discussion 11/02/2016  . Port catheter in place 10/05/2016  . Malnutrition of moderate degree  08/29/2016  . Hypoglycemia   . Dehydration   . 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 Mass of esophagus determined by endoscopy   . O2 dependent 04/10/2016  . IDDM (insulin dependent diabetes mellitus) ([place]) 04/10/2016  . Dysphagia 03/22/2016  . PND (paroxysmal nocturnal dyspnea)   . Dyslipidemia associated with type 2 diabetes mellitus ([place])   . DM (diabetes mellitus) type II, controlled, with peripheral vascular disorder ([place])   . Spasm of muscle, back 06/20/2015  . Right knee pain 10/26/2014  . Scabies 09/10/2014  . Impaired functional mobility and activity tolerance 08/26/2014  . Recurrent falls 08/26/2014  . Other emphysema ([place]) 08/26/2014  . COPD (chronic obstructive pulmonary disease) ([place])   . Rash and nonspecific skin eruption 06/27/2014  . Pruritic condition 05/18/2014  . Adjustment disorder with anxious mood 04/25/2014  . Itchy skin 04/25/2014  . OSA (obstructive sleep apnea) 02/24/2014  . Coronary atherosclerosis of native coronary artery 11/10/2013  . Abnormal cardiovascular stress test 10/16/2013  . Dyspnea on exertion 09/14/2013  . Hypoxemia 01/27/2010  . Chronic diastolic CHF (congestive heart failure) ([place]) 02/25/2009  . Morbid obesity ([place]) 09/02/2008  . Diabetes mellitus without complication ([place]) 35/36/1443  . Dyslipidemia 06/24/2007  . Essential hypertension 05/01/2007  . OSTEOARTHRITIS 05/01/2007    Palliative Care Assessment & Plan   Patient Profile: 73 y.o. male  with past medical history of stage IV cancer of esophageal junction, CAD, OSA, IDDM, COPD, HTN. HLP has been NED but not candidate for additional chemotherapy admitted on 07/16/2019 with AMS and subsequently intubated in the ED.  He was previously DNR/DNI but this was reversed in ED and he is now on mechanical ventilation.   Recommendations/Plan:  I met today with patient's wife, son, daughter, and daughter in law.  Family has expressed that he would not want to be kept  alive by life support and his desire if he were able to speak for himself would be to focus on comfort and dignity with one way extubation.  They only consented to intubation as they were told this may be  a quickly reversible process.  They are clear he would not desire prolonged mechanical ventilation.    Anticipatory guidance provided regarding withdrawal of care and plan for symptom management.  I was present at the bedside for extubation and time following extubation to ensure comfort.  He was extubated to room air following premedication with dilaudid and versed.  Restraints immediately removed and family remained at the bedside.  Little respiratory drive noted following extubation.  No signs of distress noted.  Patient expired at 1657.  Code Status:    Code Status Orders  (From admission, onward)         Start     Ordered   08/05/2019 1413  DNR (Do not attempt resuscitation)  Continuous    Question Answer Comment  In the event of cardiac or respiratory ARREST Do not call a "code blue"   In the event of cardiac or respiratory ARREST Do not perform Intubation, CPR, defibrillation or ACLS   In the event of cardiac or respiratory ARREST Use medication by any route, position, wound care, and other measures to relive pain and suffering. May use oxygen, suction and manual treatment of airway obstruction as needed for comfort.   Comments spoke with [facility]CP[facility]      08-05-19 1412        Code Status History    Date Active Date Inactive Code Status Order ID Comments User Context   Aug 05, 2019 1311 2019/08/05 1412 DNR [id number]  Margaretha Seeds, MD Inpatient   07/13/2019 2322 2019-08-05 1311 Partial Code [id number]  Kandice Hams, MD ED   07/15/2019 2318 07/20/2019 2322 Partial Code [id number]  [place]at[place]fe, Rise Paganini, MD ED   12/17/2018 1225 12/17/2018 1852 Full Code [id number]  Leonie Man, MD Inpatient   03/18/2018 2033 03/22/2018 1643 DNR [id number]  Cristy Folks, MD Inpatient   08/29/2016  1109 08/31/2016 1606 DNR [id number]  Juanito Doom, MD Inpatient   08/28/2016 2031 08/29/2016 1109 Full Code [id number]  Corey Harold, NP ED   03/15/2016 1703 03/16/2016 1654 Full Code [id number]  Florinda Marker, MD Inpatient   Advance Care Planning Activity    Advance Directive Documentation     Most Recent Value  Type of Advance Directive  Living will, Healthcare Power of Attorney  Pre-existing out of facility DNR order (yellow form or pink MOST form)  -  "MOST" Form in Place?  -      Discharge Planning: Hospital death  Thank you for allowing the Palliative Medicine Team to assist in the care of this patient.   Time: 1435-1500 1545-1630 Total Time 70 Prolonged Time Billed Yes      Greater than 50%  of this time was spent counseling and coordinating care related to the above assessment and plan.  Micheline Rough, MD  Please contact Palliative Medicine Team phone at [phone] for questions and concerns.

## 2019-07-30 NOTE — Progress Notes (Signed)
Initial Nutrition Assessment  RD working remotely.   DOCUMENTATION CODES:   Obesity unspecified  INTERVENTION:  - recommend Vital AF 1.2 @ 45 ml/hr with 60 ml prostat BID. - this regimen will provide 1696 kcal, 141 grams protein, and 876 ml free water.    NUTRITION DIAGNOSIS:   Inadequate oral intake related to inability to eat as evidenced by NPO status.  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 He presented to the ED d/t RLE swelling, SOB, and AMS. Family reported patient being more lethargic than usual.  Patient remains intubated with OGT to LIS and flow sheet indicating 100 ml output overnight. Estimated kcal needs based on weight on 06/15/19 as current weight (125.7 kg) is higher than any weight in the past 7 months. TF recommendations outlined above.   Patient underwent thoracentesis yesterday with 900 ml bloody output.   Per notes: - acute respiratory failure with hypoxia--pulmonary edema, PNA, bilateral PEs, hx of OSA - septic shock 2/2 PNA vs cardiogenic shock - acute encephalopathy - stage 4 cancer dx in 3017 and peritoneal nodal mets noted in 11/2018--s/p palliative radiation and chemo - AKI   Patient is currently intubated on ventilator support MV: 12.5 L/min Temp (24hrs), Avg:99 F (37.2 C), Min:97.2 F (36.2 C), Max:100 F (37.8 C) Propofol: none  Labs reviewed; CBGs: 123 and 124 mg/dl, Cl: 90 mmol/l, BUN: 29 mg/dl, creatinine: 1.27 mg/dl, Ca: 8.2 mg/dl, GFR: 55 ml/min. Medications reviewed; 50 mg solu-cortef QID, sliding scale  novolog, 40 mg IV protonix/day.  Drip; fentanyl @ 250 mcg/hr.     NUTRITION - FOCUSED PHYSICAL EXAM:  unable to complete at this time.   Diet Order:   Diet Order            Diet NPO time specified  Diet effective now              EDUCATION NEEDS:   No education needs have been identified at this time  Skin:  Skin Assessment: Reviewed RN Assessment  Last BM:  PTA/unknown  Height:   Ht Readings from Last 1 Encounters:  07/07/2019 5\' 8"  (1.727 m)    Weight:   Wt Readings from Last 1 Encounters:  2019/08/01 125.7 kg    Ideal Body Weight:  70 kg  BMI:  Body mass index is 42.14 kg/m.  Estimated Nutritional Needs:   Kcal:  ED:[id number] kcal  Protein:  >/= 140 grams  Fluid:  >/= 1.8 L/day     [name] Matin, MS, RD, LDN, [facility] Inpatient Clinical Dietitian Pager # [phone] After hours/weekend pager # [phone]

## 2019-07-30 DEATH — deceased

## 2019-09-03 ENCOUNTER — Ambulatory Visit: Payer: Medicare Other | Admitting: Family Medicine

## 2019-12-16 LAB — BLOOD GAS, VENOUS
O2 Saturation: 62 %
Patient temperature: 37
pH, Ven: 7.149 — CL (ref 7.250–7.430)
